# Patient Record
Sex: Male | Born: 1965 | Race: White | Hispanic: No | Marital: Single | Smoking: Current every day smoker
Health system: Southern US, Community
[De-identification: ages and names within clinical notes are randomized; demographics above are authoritative.]

## PROBLEM LIST (undated history)

## (undated) DIAGNOSIS — K029 Dental caries, unspecified: Secondary | ICD-10-CM

## (undated) DIAGNOSIS — F32A Depression, unspecified: Secondary | ICD-10-CM

## (undated) DIAGNOSIS — N186 End stage renal disease: Secondary | ICD-10-CM

## (undated) DIAGNOSIS — I10 Essential (primary) hypertension: Secondary | ICD-10-CM

## (undated) DIAGNOSIS — F1490 Cocaine use, unspecified, uncomplicated: Secondary | ICD-10-CM

## (undated) DIAGNOSIS — R51 Headache: Secondary | ICD-10-CM

## (undated) DIAGNOSIS — Z992 Dependence on renal dialysis: Secondary | ICD-10-CM

## (undated) DIAGNOSIS — J449 Chronic obstructive pulmonary disease, unspecified: Secondary | ICD-10-CM

## (undated) DIAGNOSIS — Z9289 Personal history of other medical treatment: Secondary | ICD-10-CM

## (undated) DIAGNOSIS — F329 Major depressive disorder, single episode, unspecified: Secondary | ICD-10-CM

## (undated) DIAGNOSIS — R0602 Shortness of breath: Secondary | ICD-10-CM

## (undated) DIAGNOSIS — N289 Disorder of kidney and ureter, unspecified: Secondary | ICD-10-CM

## (undated) DIAGNOSIS — M1990 Unspecified osteoarthritis, unspecified site: Secondary | ICD-10-CM

## (undated) DIAGNOSIS — I509 Heart failure, unspecified: Secondary | ICD-10-CM

## (undated) DIAGNOSIS — F419 Anxiety disorder, unspecified: Secondary | ICD-10-CM

## (undated) DIAGNOSIS — K920 Hematemesis: Secondary | ICD-10-CM

## (undated) DIAGNOSIS — R569 Unspecified convulsions: Secondary | ICD-10-CM

## (undated) DIAGNOSIS — D649 Anemia, unspecified: Secondary | ICD-10-CM

## (undated) HISTORY — PX: INGUINAL HERNIA REPAIR: SUR1180

---

## 2003-12-04 ENCOUNTER — Emergency Department (HOSPITAL_COMMUNITY): Admission: EM | Admit: 2003-12-04 | Discharge: 2003-12-04 | Payer: Self-pay | Admitting: Emergency Medicine

## 2003-12-16 ENCOUNTER — Encounter: Admission: RE | Admit: 2003-12-16 | Discharge: 2003-12-16 | Payer: Self-pay | Admitting: Internal Medicine

## 2011-11-04 ENCOUNTER — Emergency Department (HOSPITAL_COMMUNITY): Payer: Medicaid Other

## 2011-11-04 ENCOUNTER — Inpatient Hospital Stay (HOSPITAL_COMMUNITY)
Admission: EM | Admit: 2011-11-04 | Discharge: 2011-11-29 | DRG: 674 | Disposition: A | Discharge status: Home / Self Care | Payer: Medicaid Other | Attending: Nephrology | Admitting: Nephrology

## 2011-11-04 ENCOUNTER — Inpatient Hospital Stay (HOSPITAL_COMMUNITY): Payer: Medicaid Other

## 2011-11-04 ENCOUNTER — Encounter (HOSPITAL_COMMUNITY): Payer: Self-pay | Admitting: *Deleted

## 2011-11-04 ENCOUNTER — Encounter (HOSPITAL_COMMUNITY): Payer: Self-pay

## 2011-11-04 ENCOUNTER — Emergency Department (INDEPENDENT_AMBULATORY_CARE_PROVIDER_SITE_OTHER)
Admission: EM | Admit: 2011-11-04 | Discharge: 2011-11-04 | Disposition: A | Discharge status: Other Acute Inpatient Hospital | Payer: Self-pay | Source: Home / Self Care | Attending: Family Medicine | Admitting: Family Medicine

## 2011-11-04 DIAGNOSIS — D689 Coagulation defect, unspecified: Secondary | ICD-10-CM

## 2011-11-04 DIAGNOSIS — D638 Anemia in other chronic diseases classified elsewhere: Secondary | ICD-10-CM | POA: Diagnosis present

## 2011-11-04 DIAGNOSIS — T451X5A Adverse effect of antineoplastic and immunosuppressive drugs, initial encounter: Secondary | ICD-10-CM | POA: Diagnosis not present

## 2011-11-04 DIAGNOSIS — N250 Renal osteodystrophy: Secondary | ICD-10-CM | POA: Diagnosis present

## 2011-11-04 DIAGNOSIS — D649 Anemia, unspecified: Secondary | ICD-10-CM

## 2011-11-04 DIAGNOSIS — Z7982 Long term (current) use of aspirin: Secondary | ICD-10-CM

## 2011-11-04 DIAGNOSIS — R5381 Other malaise: Secondary | ICD-10-CM | POA: Diagnosis present

## 2011-11-04 DIAGNOSIS — N179 Acute kidney failure, unspecified: Secondary | ICD-10-CM

## 2011-11-04 DIAGNOSIS — R358 Other polyuria: Secondary | ICD-10-CM | POA: Diagnosis present

## 2011-11-04 DIAGNOSIS — J4489 Other specified chronic obstructive pulmonary disease: Secondary | ICD-10-CM | POA: Diagnosis present

## 2011-11-04 DIAGNOSIS — R630 Anorexia: Secondary | ICD-10-CM | POA: Diagnosis present

## 2011-11-04 DIAGNOSIS — R5383 Other fatigue: Secondary | ICD-10-CM | POA: Diagnosis present

## 2011-11-04 DIAGNOSIS — E875 Hyperkalemia: Secondary | ICD-10-CM

## 2011-11-04 DIAGNOSIS — R042 Hemoptysis: Secondary | ICD-10-CM

## 2011-11-04 DIAGNOSIS — R3589 Other polyuria: Secondary | ICD-10-CM | POA: Diagnosis present

## 2011-11-04 DIAGNOSIS — F1910 Other psychoactive substance abuse, uncomplicated: Secondary | ICD-10-CM

## 2011-11-04 DIAGNOSIS — N058 Unspecified nephritic syndrome with other morphologic changes: Principal | ICD-10-CM | POA: Diagnosis present

## 2011-11-04 DIAGNOSIS — F1410 Cocaine abuse, uncomplicated: Secondary | ICD-10-CM | POA: Diagnosis present

## 2011-11-04 DIAGNOSIS — N189 Chronic kidney disease, unspecified: Secondary | ICD-10-CM

## 2011-11-04 DIAGNOSIS — K5900 Constipation, unspecified: Secondary | ICD-10-CM | POA: Diagnosis present

## 2011-11-04 DIAGNOSIS — N19 Unspecified kidney failure: Secondary | ICD-10-CM

## 2011-11-04 DIAGNOSIS — N2581 Secondary hyperparathyroidism of renal origin: Secondary | ICD-10-CM | POA: Diagnosis present

## 2011-11-04 DIAGNOSIS — Z23 Encounter for immunization: Secondary | ICD-10-CM

## 2011-11-04 DIAGNOSIS — K921 Melena: Secondary | ICD-10-CM

## 2011-11-04 DIAGNOSIS — G4700 Insomnia, unspecified: Secondary | ICD-10-CM | POA: Diagnosis present

## 2011-11-04 DIAGNOSIS — E872 Acidosis, unspecified: Secondary | ICD-10-CM | POA: Diagnosis not present

## 2011-11-04 DIAGNOSIS — J449 Chronic obstructive pulmonary disease, unspecified: Secondary | ICD-10-CM | POA: Diagnosis present

## 2011-11-04 DIAGNOSIS — R1085 Abdominal pain of multiple sites: Secondary | ICD-10-CM

## 2011-11-04 DIAGNOSIS — F1490 Cocaine use, unspecified, uncomplicated: Secondary | ICD-10-CM | POA: Diagnosis present

## 2011-11-04 DIAGNOSIS — R634 Abnormal weight loss: Secondary | ICD-10-CM

## 2011-11-04 DIAGNOSIS — E860 Dehydration: Secondary | ICD-10-CM | POA: Diagnosis present

## 2011-11-04 DIAGNOSIS — K029 Dental caries, unspecified: Secondary | ICD-10-CM

## 2011-11-04 DIAGNOSIS — R809 Proteinuria, unspecified: Secondary | ICD-10-CM | POA: Diagnosis present

## 2011-11-04 DIAGNOSIS — I776 Arteritis, unspecified: Secondary | ICD-10-CM | POA: Diagnosis present

## 2011-11-04 DIAGNOSIS — T39391A Poisoning by other nonsteroidal anti-inflammatory drugs [NSAID], accidental (unintentional), initial encounter: Secondary | ICD-10-CM | POA: Diagnosis present

## 2011-11-04 DIAGNOSIS — K5904 Chronic idiopathic constipation: Secondary | ICD-10-CM | POA: Diagnosis present

## 2011-11-04 DIAGNOSIS — Z720 Tobacco use: Secondary | ICD-10-CM

## 2011-11-04 DIAGNOSIS — R6883 Chills (without fever): Secondary | ICD-10-CM | POA: Diagnosis present

## 2011-11-04 DIAGNOSIS — F17200 Nicotine dependence, unspecified, uncomplicated: Secondary | ICD-10-CM | POA: Diagnosis present

## 2011-11-04 DIAGNOSIS — R319 Hematuria, unspecified: Secondary | ICD-10-CM

## 2011-11-04 DIAGNOSIS — R030 Elevated blood-pressure reading, without diagnosis of hypertension: Secondary | ICD-10-CM | POA: Diagnosis not present

## 2011-11-04 DIAGNOSIS — H5359 Other color vision deficiencies: Secondary | ICD-10-CM | POA: Diagnosis present

## 2011-11-04 DIAGNOSIS — R112 Nausea with vomiting, unspecified: Secondary | ICD-10-CM | POA: Diagnosis present

## 2011-11-04 DIAGNOSIS — F1210 Cannabis abuse, uncomplicated: Secondary | ICD-10-CM | POA: Diagnosis present

## 2011-11-04 DIAGNOSIS — R109 Unspecified abdominal pain: Secondary | ICD-10-CM | POA: Diagnosis present

## 2011-11-04 DIAGNOSIS — H5350 Unspecified color vision deficiencies: Secondary | ICD-10-CM

## 2011-11-04 DIAGNOSIS — D6959 Other secondary thrombocytopenia: Secondary | ICD-10-CM | POA: Diagnosis not present

## 2011-11-04 HISTORY — DX: Dental caries, unspecified: K02.9

## 2011-11-04 HISTORY — DX: Cocaine use, unspecified, uncomplicated: F14.90

## 2011-11-04 HISTORY — DX: Chronic obstructive pulmonary disease, unspecified: J44.9

## 2011-11-04 LAB — ABO/RH: ABO/RH(D): O POS

## 2011-11-04 LAB — POCT I-STAT TROPONIN I: Troponin i, poc: 0 ng/mL (ref 0.00–0.08)

## 2011-11-04 LAB — URINE MICROSCOPIC-ADD ON

## 2011-11-04 LAB — CBC
MCH: 29.1 pg (ref 26.0–34.0)
MCV: 84.8 fL (ref 78.0–100.0)
RBC: 2.89 MIL/uL — ABNORMAL LOW (ref 4.22–5.81)
RDW: 12.9 % (ref 11.5–15.5)
WBC: 5 10*3/uL (ref 4.0–10.5)

## 2011-11-04 LAB — PRO B NATRIURETIC PEPTIDE: Pro B Natriuretic peptide (BNP): 592.1 pg/mL — ABNORMAL HIGH (ref 0–125)

## 2011-11-04 LAB — URINALYSIS, ROUTINE W REFLEX MICROSCOPIC
Bilirubin Urine: NEGATIVE
Nitrite: NEGATIVE
Protein, ur: 100 mg/dL — AB
Specific Gravity, Urine: 1.014 (ref 1.005–1.030)

## 2011-11-04 LAB — COMPREHENSIVE METABOLIC PANEL
AST: 5 U/L (ref 0–37)
Albumin: 3.2 g/dL — ABNORMAL LOW (ref 3.5–5.2)
Alkaline Phosphatase: 59 U/L (ref 39–117)
CO2: 16 mEq/L — ABNORMAL LOW (ref 19–32)
Calcium: 9 mg/dL (ref 8.4–10.5)
Glucose, Bld: 80 mg/dL (ref 70–99)
Potassium: 6 mEq/L — ABNORMAL HIGH (ref 3.5–5.1)

## 2011-11-04 LAB — RENAL FUNCTION PANEL
CO2: 17 mEq/L — ABNORMAL LOW (ref 19–32)
Calcium: 8.8 mg/dL (ref 8.4–10.5)
GFR calc Af Amer: 6 mL/min — ABNORMAL LOW (ref >90)
GFR calc non Af Amer: 5 mL/min — ABNORMAL LOW (ref >90)
Glucose, Bld: 93 mg/dL (ref 70–99)
Phosphorus: 6.1 mg/dL — ABNORMAL HIGH (ref 2.3–4.6)
Potassium: 5.8 mEq/L — ABNORMAL HIGH (ref 3.5–5.1)
Sodium: 137 mEq/L (ref 135–145)

## 2011-11-04 LAB — IRON AND TIBC
TIBC: 218 ug/dL (ref 215–435)
UIBC: 166 ug/dL (ref 125–400)

## 2011-11-04 LAB — HEPATIC FUNCTION PANEL
Alkaline Phosphatase: 65 U/L (ref 39–117)
Total Protein: 7.6 g/dL (ref 6.0–8.3)

## 2011-11-04 LAB — DIFFERENTIAL
Basophils Absolute: 0 10*3/uL (ref 0.0–0.1)
Eosinophils Absolute: 0.1 10*3/uL (ref 0.0–0.7)
Monocytes Absolute: 0.2 10*3/uL (ref 0.1–1.0)
Neutrophils Relative %: 71 % (ref 43–77)

## 2011-11-04 LAB — MRSA PCR SCREENING: MRSA by PCR: NEGATIVE

## 2011-11-04 LAB — FERRITIN: Ferritin: 286 ng/mL (ref 22–322)

## 2011-11-04 LAB — RETICULOCYTES
RBC.: 3.12 MIL/uL — ABNORMAL LOW (ref 4.22–5.81)
Retic Ct Pct: 0.5 % (ref 0.4–3.1)

## 2011-11-04 LAB — CARDIAC PANEL(CRET KIN+CKTOT+MB+TROPI)
Total CK: 66 U/L (ref 7–232)
Troponin I: <0.3 ng/mL (ref <0.30)

## 2011-11-04 LAB — PHOSPHORUS: Phosphorus: 6.6 mg/dL — ABNORMAL HIGH (ref 2.3–4.6)

## 2011-11-04 LAB — LIPASE, BLOOD: Lipase: 33 U/L (ref 11–59)

## 2011-11-04 LAB — MAGNESIUM: Magnesium: 2.2 mg/dL (ref 1.5–2.5)

## 2011-11-04 LAB — APTT: aPTT: 60 seconds — ABNORMAL HIGH (ref 24–37)

## 2011-11-04 LAB — PROTIME-INR
INR: 1.14 (ref 0.00–1.49)
Prothrombin Time: 14.8 seconds (ref 11.6–15.2)

## 2011-11-04 LAB — VITAMIN B12: Vitamin B-12: 412 pg/mL (ref 211–911)

## 2011-11-04 MED ORDER — SODIUM BICARBONATE 8.4 % IV SOLN
INTRAVENOUS | Status: DC
  Administered 2011-11-05 – 2011-11-08 (×10): via INTRAVENOUS
  Filled 2011-11-04 (×24): qty 150

## 2011-11-04 MED ORDER — SODIUM CHLORIDE 0.9 % IV SOLN
Freq: Once | INTRAVENOUS | Status: AC
  Administered 2011-11-04: 16:00:00 via INTRAVENOUS

## 2011-11-04 MED ORDER — ONDANSETRON HCL 4 MG/2ML IJ SOLN: 4.0000 mg | Freq: Four times a day (QID) | INTRAMUSCULAR | Status: DC | PRN

## 2011-11-04 MED ORDER — SODIUM CHLORIDE 0.9 % IV SOLN
INTRAVENOUS | Status: DC
  Administered 2011-11-04 – 2011-11-07 (×5): via INTRAVENOUS

## 2011-11-04 MED ORDER — PANTOPRAZOLE SODIUM 40 MG PO TBEC
40.0000 mg | DELAYED_RELEASE_TABLET | Freq: Two times a day (BID) | ORAL | Status: DC
  Administered 2011-11-05 – 2011-11-11 (×13): 40 mg via ORAL
  Filled 2011-11-04 (×14): qty 1

## 2011-11-04 MED ORDER — SODIUM POLYSTYRENE SULFONATE 15 GM/60ML PO SUSP
30.0000 g | Freq: Once | ORAL | Status: AC
  Administered 2011-11-04: 30 g via ORAL
  Filled 2011-11-04: qty 120

## 2011-11-04 MED ORDER — METHYLPREDNISOLONE SODIUM SUCC 1000 MG IJ SOLR
500.0000 mg | INTRAMUSCULAR | Status: DC
  Administered 2011-11-05 – 2011-11-06 (×3): 500 mg via INTRAVENOUS
  Filled 2011-11-04 (×4): qty 4

## 2011-11-04 MED ORDER — ONDANSETRON HCL 4 MG PO TABS: 4.0000 mg | ORAL_TABLET | Freq: Four times a day (QID) | ORAL | Status: DC | PRN

## 2011-11-04 MED ORDER — SODIUM CHLORIDE 0.9 % IJ SOLN
3.0000 mL | Freq: Two times a day (BID) | INTRAMUSCULAR | Status: DC
  Administered 2011-11-04 – 2011-11-09 (×4): 3 mL via INTRAVENOUS

## 2011-11-04 MED ORDER — PANTOPRAZOLE SODIUM 40 MG IV SOLR: 40.0000 mg | INTRAVENOUS | Status: DC

## 2011-11-04 MED ORDER — LEVOFLOXACIN IN D5W 750 MG/150ML IV SOLN
750.0000 mg | Freq: Once | INTRAVENOUS | Status: AC
  Administered 2011-11-05: 750 mg via INTRAVENOUS
  Filled 2011-11-04: qty 150

## 2011-11-04 MED ORDER — ALBUTEROL SULFATE (5 MG/ML) 0.5% IN NEBU: 2.5000 mg | INHALATION_SOLUTION | Freq: Four times a day (QID) | RESPIRATORY_TRACT | Status: DC | PRN

## 2011-11-04 MED ORDER — SODIUM POLYSTYRENE SULFONATE 15 GM/60ML PO SUSP: 45.0000 g | Freq: Once | ORAL | Status: DC

## 2011-11-04 MED ORDER — LEVOFLOXACIN IN D5W 500 MG/100ML IV SOLN: 500.0000 mg | INTRAVENOUS | Status: DC

## 2011-11-04 NOTE — ED Notes (Signed)
C/o lost 7-10 lb past few days, coughing up blood past 2 days, LUQ pain, epigastric pain, nausea, bloated,

## 2011-11-04 NOTE — ED Provider Notes (Signed)
Medical screening examination/treatment/procedure(s) were conducted as a shared visit with non-physician practitioner(s) and myself.  I personally evaluated the patient during the encounter  Pt with multiple symptoms, findings of anemia, associated renal failure with mild hyperkalemia of 6, no ECG changes, given kayaxelate, IVF's.  Pt agreeable to admission.  CXR shows no obv pneumonia or mass.  No fever.  Hemodynamically stable, airway intact, no respiratory distress noted on exam.  sats normal on RA.    Saddie Benders. Quina Wilbourne, MD 11/04/11 2101

## 2011-11-04 NOTE — Consult Note (Signed)
[facility] Renal Consultation Note  Requesting MD: Hospitalist service Indication for Consultation: Presenting with a creatinine of 10 and no historical data  HPI: Jeremy Mccoy is a 46 y.o.white male with past medical history significant for lifelong tobacco use and likely COPD, history of crack cocaine use but otherwise no history of going to the doctor.  He presents today to the emergency department with a week long history of weakness, dizziness, nausea vomiting and abdominal pain. He's had anorexia and an unintentional weight loss. She reports possibly coughing up blood 3 days ago.  He reportedly took about 1000 mg of ibuprofen 2 days ago but has not otherwise been using/abusing those medications. He has no history of hypertension or being treated with an ACE or an ARB. His labs  In the emergency department show a BUN of 107, creatinine of 10.6 and potassium of 6. His urinalysis shows too numerous to count RBCs with also hyaline casts. Chest is hemoglobin of 8.4. 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 He's going to the bathroom more frequently but volume overall may be down. 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 However, has noticed the last couple of weeks has been more difficult to get things done. He finally comes to the hospital at the prompting of his family today.  Apparently his sister has some type of syndrome (Birt-Hogg-Dube) which causes kidney tumors, but they don't indicate a family history of renal failure.   Creatinine, Ser  Date/Time Value Range Status  11/04/2011  2:05 PM 10.64* 0.50 - 1.35 mg/dL Final     PMHx:   Past Medical History  Diagnosis Date  . COPD (chronic obstructive pulmonary disease)   . Smoker   . Crack cocaine use   . Dental caries     No past surgical history on file.  Family Hx: No family history on file.  Social History:  reports  that he has been smoking.  He does not have any smokeless tobacco history on file. He reports that he uses illicit drugs (Cocaine and Marijuana). He reports that he does not drink alcohol.  Allergies: No Known Allergies  Medications: Prior to Admission medications   Medication Sig Start Date End Date Taking? Authorizing Provider  aspirin 81 MG tablet Take 81 mg by mouth daily.   Yes Historical Provider, MD  ibuprofen (ADVIL,MOTRIN) 200 MG tablet Take 200 mg by mouth every 6 (six) hours as needed. For pain   Yes Historical Provider, MD    I have reviewed the patient's current medications.  Labs:  Results for orders placed during the hospital encounter of 11/04/11 (from the past 48 hour(s))  CBC     Status: Abnormal   Collection Time   11/04/11  2:05 PM      Component Value Range Comment   WBC 5.0  4.0 - 10.5 K/uL    RBC 2.89 (*) 4.22 - 5.81 MIL/uL    Hemoglobin 8.4 (*) 13.0 - 17.0 g/dL    HCT 24.5 (*) 39.0 - 52.0 %    MCV 84.8  78.0 - 100.0 fL    MCH 29.1  26.0 - 34.0 pg    MCHC 34.3  30.0 - 36.0 g/dL    RDW 12.9  11.5 - 15.5 %    Platelets 171  150 - 400 K/uL   DIFFERENTIAL     Status: Normal  Collection Time   11/04/11  2:05 PM      Component Value Range Comment   Neutrophils Relative 71  43 - 77 %    Neutro Abs 3.5  1.7 - 7.7 K/uL    Lymphocytes Relative 22  12 - 46 %    Lymphs Abs 1.1  0.7 - 4.0 K/uL    Monocytes Relative 4  3 - 12 %    Monocytes Absolute 0.2  0.1 - 1.0 K/uL    Eosinophils Relative 3  0 - 5 %    Eosinophils Absolute 0.1  0.0 - 0.7 K/uL    Basophils Relative 0  0 - 1 %    Basophils Absolute 0.0  0.0 - 0.1 K/uL   COMPREHENSIVE METABOLIC PANEL     Status: Abnormal   Collection Time   11/04/11  2:05 PM      Component Value Range Comment   Sodium 135  135 - 145 mEq/L    Potassium 6.0 (*) 3.5 - 5.1 mEq/L    Chloride 104  96 - 112 mEq/L    CO2 16 (*) 19 - 32 mEq/L    Glucose, Bld 80  70 - 99 mg/dL    BUN 107 (*) 6 - 23 mg/dL    Creatinine, Ser 10.64 (*)  0.50 - 1.35 mg/dL    Calcium 9.0  8.4 - 10.5 mg/dL    Total Protein 7.1  6.0 - 8.3 g/dL    Albumin 3.2 (*) 3.5 - 5.2 g/dL    AST 5  0 - 37 U/L    ALT <5  0 - 53 U/L REPEATED TO VERIFY   Alkaline Phosphatase 59  39 - 117 U/L    Total Bilirubin 0.1 (*) 0.3 - 1.2 mg/dL    GFR calc non Af Amer 5 (*) >90 mL/min    GFR calc Af Amer 6 (*) >90 mL/min   LIPASE, BLOOD     Status: Normal   Collection Time   11/04/11  2:05 PM      Component Value Range Comment   Lipase 33  11 - 59 U/L   LACTATE DEHYDROGENASE     Status: Normal   Collection Time   11/04/11  2:05 PM      Component Value Range Comment   LDH 177  94 - 250 U/L   URINALYSIS, ROUTINE W REFLEX MICROSCOPIC     Status: Abnormal   Collection Time   11/04/11  2:10 PM      Component Value Range Comment   Color, Urine YELLOW  YELLOW    APPearance CLOUDY (*) CLEAR    Specific Gravity, Urine 1.014  1.005 - 1.030    pH 5.0  5.0 - 8.0    Glucose, UA NEGATIVE  NEGATIVE mg/dL    Hgb urine dipstick LARGE (*) NEGATIVE    Bilirubin Urine NEGATIVE  NEGATIVE    Ketones, ur NEGATIVE  NEGATIVE mg/dL    Protein, ur 100 (*) NEGATIVE mg/dL    Urobilinogen, UA 0.2  0.0 - 1.0 mg/dL    Nitrite NEGATIVE  NEGATIVE    Leukocytes, UA TRACE (*) NEGATIVE   URINE MICROSCOPIC-ADD ON     Status: Abnormal   Collection Time   11/04/11  2:10 PM      Component Value Range Comment   Squamous Epithelial / LPF RARE  RARE    WBC, UA 0-2  <3 WBC/hpf    RBC / HPF TOO NUMEROUS TO COUNT  <3 RBC/hpf  Casts HYALINE CASTS (*) NEGATIVE GRANULAR CAST   Urine-Other AMORPHOUS URATES/PHOSPHATES     POCT I-STAT TROPONIN I     Status: Normal   Collection Time   11/04/11  2:29 PM      Component Value Range Comment   Troponin i, poc 0.00  0.00 - 0.08 ng/mL    Comment 3            TYPE AND SCREEN     Status: Normal   Collection Time   11/04/11  2:53 PM      Component Value Range Comment   ABO/RH(D) O POS      Antibody Screen NEG      Sample Expiration 11/07/2011     ABO/RH      Status: Normal   Collection Time   11/04/11  2:53 PM      Component Value Range Comment   ABO/RH(D) O POS     PROTIME-INR     Status: Normal   Collection Time   11/04/11  3:03 PM      Component Value Range Comment   Prothrombin Time 14.8  11.6 - 15.2 seconds    INR 1.14  0.00 - 1.49   APTT     Status: Abnormal   Collection Time   11/04/11  3:03 PM      Component Value Range Comment   aPTT 60 (*) 24 - 37 seconds   RETICULOCYTES     Status: Abnormal   Collection Time   11/04/11  3:03 PM      Component Value Range Comment   Retic Ct Pct 0.5  0.4 - 3.1 %    RBC. 3.12 (*) 4.22 - 5.81 MIL/uL    Retic Count, Manual 15.6 (*) 19.0 - 186.0 K/uL      ROS:  A comprehensive review of systems was negative except for: Constitutional: positive for fatigue and weight loss. He reports 1 episode of hemoptysis 3 days ago but has not been ongoing. He reports some mild abdominal pain and possible tarry stools. He has decreased by mouth intake but no nausea or vomiting. He denies lower extremity edema. He admits to polyuria but overall states that he thinks his volume of urine output is down.  He has upper extremity paresthesias which she's had before. 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 He denies any rashes or joint complaints. The remainder of the review systems is negative.  Physical Exam: Filed Vitals:   11/04/11 1517  BP: 124/77  Pulse: 57  Temp:   Resp: 21     General: An alert white male who appears older than his stated age. He is thin. He is in no acute distress. HEENT: Pupils are equal round reactive to light, extra motions are intact, there is no scleral icterus. Mucous membranes are slightly dry. Neck: There is no jugular venous distention, carotid bruits or lymphadenopathy. Heart: Bradycardic without murmur, gallop, or rub. Lungs: Poor air movement bilaterally. There are no crackles. It does not appear to be effusions. Abdomen: Positive bowel sounds,  soft, nontender, nondistended. There is no hepatosplenomegaly and no abdominal bruits are appreciated. Extremities: No clubbing, cyanosis, or edema. Skin: No rash. No synovitis. Neuro: Patient is alert and oriented x3. The remainder of the neurologic exam is nonfocal.  Assessment/Plan: 46 year old white male with no past medical history and no historical laboratory data who presents with a creatinine of 10, BUN of 107 and potassium of 6. He also had hematuria  and possible history of hemoptysis. He may be GI bleeding as well. 1.Renal- elevated creatinine of unknown etiology.  His urinalysis basically shows hematuria.   His renal ultrasound shows normal-sized kidneys with some increased cortical echogenicity with no hydro-.  Therefore, hopefully this indicates an acute process that might ? be reversible. Given that he has hematuria and hemoptysis this raises the issue of a possible pulmonary renal syndrome. The appropriate serologies will be collected (ANA, ANCA., anti-GBM ).  Since it is now Friday evening a kidney biopsy will not be able to be obtained until Monday. I will start him on high-dose steroids empirically.  Of course the other etiologies of his elevated creatinine could be more benign and may be ATN and GI bleeding from decreased oral intake, or from a cocaine-induced hypertensive kidney disease. I will hydrate him with fluids and we will monitor his kidney function, and ins and outs closely.  I really do not know his renal prognosis at this time. There are no acute indications for dialysis as long as we can treat his potassium medically. 2. Hypertension/volume  - interesting that he is not hypertensive in the setting of renal failure. This may mean that he is dry. Will hydrate with fluids as above. 3. Hyperkalemia- could be due to GI bleeding, could be swallowing blood from a pulmonary source. Have given a dose of Kayexalate and will hydrate and treat his acidosis which should help as well.   4.  Metabolic acidosis- due to renal failure. Will start a bicarbonate drip. 5. Anemia  - either do to chronic kidney disease or bleeding either from a pulmonary or GI source. If not low enough to need transfusion at this time. However with hydration it may lower. INR and platelets are normal.  Thank you for this consultation,  we'll follow closely with you   Tanya Crothers A 11/04/2011, 5:05 PM

## 2011-11-04 NOTE — ED Notes (Signed)
Feels like your ribs broke, numbness both arms, air in his chest, dizzy, cannot keep food down; cold like symptoms. Cold/hot. Productive cough - red tinged. Smoke weed and made him gag.

## 2011-11-04 NOTE — ED Notes (Signed)
Pt states that when he coughs, he "sprays blood all over." Pt states he has been sob "for a long time-like a couple years."

## 2011-11-04 NOTE — Progress Notes (Signed)
ANTIBIOTIC CONSULT NOTE - INITIAL  Pharmacy Consult for Levaquin Indication: Empiric Coverage  No Known Allergies  Patient Measurements:  Ht 6 feet, Wt 71.4 kg (IBW 77.6 kg)  Vital Signs: Temp: 97.8 F (36.6 C) (06/21 1235) Temp src: Oral (06/21 1235) BP: 144/85 mmHg (06/21 1615) Pulse Rate: 64  (06/21 1615) Intake/Output from previous day:   Intake/Output from this shift:    Labs:  Basename 11/04/11 1405  WBC 5.0  HGB 8.4*  PLT 171  LABCREA --  CREATININE 10.64*   CrCl is unknown because there is no height on file for the current visit. No results found for this basename: VANCOTROUGH:2,VANCOPEAK:2,VANCORANDOM:2,GENTTROUGH:2,GENTPEAK:2,GENTRANDOM:2,TOBRATROUGH:2,TOBRAPEAK:2,TOBRARND:2,AMIKACINPEAK:2,AMIKACINTROU:2,AMIKACIN:2, in the last 72 hours   Microbiology: No results found for this or any previous visit (from the past 720 hour(s)).  Medical History: Past Medical History  Diagnosis Date  . COPD (chronic obstructive pulmonary disease)   . Smoker   . Crack cocaine use   . Dental caries     Assessment: 46 yo male with CC of coughing up blood to start on levaquin for empiric coverage of possible bacteremia related to dental caries and possible urine infection per MD. Pt is afebrile with WBC wnl at 5.0,  SCr of 10.64 (acute renal failure on suspected CKD) with recent high dose NSAID use and dehydration. CrCl ~9 ml/min. Per Renal, no acute indications for dialysis as long as high  K+ can be treated medically. BCx x2, UCx pending.   Goal of Therapy:  Clinical resolution  Plan:  1.  Levaquin 750 mg IV x1, then 500 mg IV q48h 2.  Follow up renal function and renal plans for dose adjustments 3.  Follow up cultures, s/sx of infection    Allene Pyo 11/04/2011,5:35 PM

## 2011-11-04 NOTE — ED Notes (Signed)
Pt returned from xray

## 2011-11-04 NOTE — ED Provider Notes (Signed)
History     CSN: [id number]  Arrival date & time 11/04/11  1226   First MD Initiated Contact with Patient 11/04/11 1310      Chief Complaint  Patient presents with  . Hemoptysis    (Consider location/radiation/quality/duration/timing/severity/associated sxs/prior treatment) Patient is a 46 y.o. male presenting with weakness. The history is provided by the patient.  Weakness The primary symptoms include headaches, dizziness, nausea and vomiting. Primary symptoms do not include loss of consciousness, altered mental status, visual change or fever. The symptoms began more than 1 week ago. The symptoms are unchanged. The neurological symptoms are diffuse.  The headache is associated with weakness. The headache is not associated with visual change or neck stiffness.  Dizziness also occurs with nausea and vomiting.   Additional symptoms include weakness. Additional symptoms do not include neck stiffness.  PT states he has had weakness, cough, shortness of breath, anorexia, nausea with eating for the last 2-3 weeks. States today coughed up about 2 shots full of blood. States went to UC was sent here. Pt states he has had pain in left upper quadrant and left flank in th past, but it comes and goes. Admtis to heavy smoking. No primary care provider. Admits to about 8lb weight loss in last 2 wks, but states that's because he has not been eating. Denies recent travel, weight loss.   Past Medical History  Diagnosis Date  . COPD (chronic obstructive pulmonary disease)   . Smoker     No past surgical history on file.  No family history on file.  History  Substance Use Topics  . Smoking status: Current Everyday Smoker -- 1.0 packs/day  . Smokeless tobacco: Not on file  . Alcohol Use: No      Review of Systems  Constitutional: Negative for fever and chills.  HENT: Negative for neck pain and neck stiffness.   Respiratory: Positive for cough and shortness of breath.   Cardiovascular:  Negative.   Gastrointestinal: Positive for nausea, vomiting and abdominal pain. Negative for diarrhea and blood in stool.  Genitourinary: Negative for dysuria and flank pain.  Musculoskeletal: Negative.   Skin: Positive for pallor.  Neurological: Positive for dizziness and headaches. Negative for loss of consciousness.  Psychiatric/Behavioral: Negative for altered mental status.    Allergies  Review of patient's allergies indicates no known allergies.  Home Medications   Current Outpatient Rx  Name Route Sig Dispense Refill  . ASPIRIN 81 MG PO TABS Oral Take 81 mg by mouth daily.    . IBUPROFEN 200 MG PO TABS Oral Take 200 mg by mouth every 6 (six) hours as needed. For pain      BP 124/77  Pulse 57  Temp 97.8 F (36.6 C) (Oral)  Resp 21  SpO2 100%  Physical Exam  Nursing note and vitals reviewed. Constitutional: He is oriented to person, place, and time. He appears well-developed and well-nourished. No distress.  HENT:  Head: Normocephalic.  Eyes: Conjunctivae are normal.  Neck: Normal range of motion. Neck supple.  Cardiovascular: Normal rate, regular rhythm and normal heart sounds.   Pulmonary/Chest: Effort normal. No respiratory distress. He has wheezes. He has no rales.       Expiratory wheezes in all lung fields  Abdominal: Soft. Bowel sounds are normal. He exhibits no distension. There is no tenderness. There is no rebound.  Musculoskeletal: Normal range of motion. He exhibits no edema.  Neurological: He is alert and oriented to person, place, and time. No cranial nerve  deficit. Coordination normal.  Skin: Skin is warm and dry. There is pallor.  Psychiatric: He has a normal mood and affect.    ED Course  Procedures (including critical care time)   Date: 11/04/2011  Rate: 65  Rhythm: normal sinus rhythm  QRS Axis: normal  Intervals: normal  ST/T Wave abnormalities: normal  Conduction Disutrbances:none  Narrative Interpretation:   Old EKG Reviewed: none  available   Pt  Is pale appearing, no obvious active bleeding currently. Vital signs normal. Will get labs, CXR.   Labs Reviewed  CBC - Abnormal; Notable for the following:    RBC 2.89 (*)     Hemoglobin 8.4 (*)     HCT 24.5 (*)     All other components within normal limits  COMPREHENSIVE METABOLIC PANEL - Abnormal; Notable for the following:    Potassium 6.0 (*)     CO2 16 (*)     BUN 107 (*)     Creatinine, Ser 10.64 (*)     Albumin 3.2 (*)     Total Bilirubin 0.1 (*)     GFR calc non Af Amer 5 (*)     GFR calc Af Amer 6 (*)     All other components within normal limits  URINALYSIS, ROUTINE W REFLEX MICROSCOPIC - Abnormal; Notable for the following:    APPearance CLOUDY (*)     Hgb urine dipstick LARGE (*)     Protein, ur 100 (*)     Leukocytes, UA TRACE (*)     All other components within normal limits  URINE MICROSCOPIC-ADD ON - Abnormal; Notable for the following:    Casts HYALINE CASTS (*)  GRANULAR CAST   All other components within normal limits  APTT - Abnormal; Notable for the following:    aPTT 60 (*)     All other components within normal limits  RETICULOCYTES - Abnormal; Notable for the following:    RBC. 3.12 (*)     Retic Count, Manual 15.6 (*)     All other components within normal limits  DIFFERENTIAL  LIPASE, BLOOD  POCT I-STAT TROPONIN I  PROTIME-INR  TYPE AND SCREEN  LACTATE DEHYDROGENASE  ABO/RH  VITAMIN B12  FOLATE  IRON AND TIBC  FERRITIN   Dg Chest 2 View  11/04/2011  *RADIOLOGY REPORT*  Clinical Data: Chest pain cough.  CHEST - 2 VIEW  Comparison: 12/04/2003  Findings: Hilar fullness is similar to prior.  Cardiac contour within normal limits.  There is a mild interstitial prominence and peribronchial thickening.  No focal consolidation, pleural effusion, or pneumothorax.  No acute osseous finding.  There is mild wedge deformity of a lower thoracic vertebral body, similar to prior.  IMPRESSION: Mild interstitial prominence / peribronchial  thickening is nonspecific.  May reflect chronic bronchitic change (particularly if there is a smoking history) or seen with atypical infection.  No focal consolidation.  Original Report Authenticated By: Suanne Marker, M.D.   4:08 PM HGB 8.4. Creat 10.64, BUN 107. Ordered kayexalate. Glu in urine. Denies bloody stool. Ordrered US renal, anemia panel. Will admit.  Filed Vitals:   11/04/11 1517  BP: 124/77  Pulse: 57  Temp:   Resp: 21   4:13 PM Spoke with triad, will admit.   1. Renal failure   2. Anemia   3. Hemoptysis   4. Hematuria   5. Hyperkalemia       MDM          Renold Genta, PA 11/04/11  1613 

## 2011-11-04 NOTE — ED Notes (Signed)
Pt transported to XRAY °

## 2011-11-04 NOTE — H&P (Addendum)
History and Physical Examination  Date: 11/04/2011  Patient name: Jeremy Mccoy Medical record number: [id number] Date of birth: 1966/04/26 Age: 46 y.o. Gender: male PCP: Pcp Not In System  Chief Complaint:  Chief Complaint  Patient presents with  . Hemoptysis     History of Present Illness: Jeremy Mccoy is an 46 y.o. male long time crack cocaine user, former alcoholic, smoker who has not seen a physician or care provider in the past 6 years and does not have a regular physician reports that he began to feel left rib pain approximately 2 weeks ago.  His symptoms progressed to pain in the epigastric area of his abdomen that felt like air trapped in the abdomen and gas.  He says the pain then alternated between the left side of the abdomen and the epigastric area for approximately one week.  The pain became more severe and the patient developed nausea and dizziness.  He reports that the pain and abdominal discomfort became so severe that he had trouble sleeping.  He reports increased urination with only a small amount of urine.  He reports the development of tingling and numbness in the hands.  Approximately 2 days ago he reports that he began to his cough.  He reports that he noticed blood in the sputum.  He also reports that he has lost approximately 7 pounds over the past week.  He reports that 2 days prior to admission he took five 200 mg ibuprofen tablets with no relief in symptoms.  The patient reports that he noticed that his urine has been "dirty".  The patient says that he's been experiencing shaking cold chills over the past several days.  The patient denies subjective fever.  The patient reports alternating constipation and loose stools.  The patient reports no vomiting but has been severely nauseated.  He decided to seek medical care because of the coughing up of blood.  He went to the local urgent care center and was evaluated and then sent to the emergency department for further  evaluation and management.  He emergency department he was noted to have a creatinine greater than 10.  He was hyperkalemic with a potassium of 6.0.  He had some evidence of peak T waves on EKG.  The patient was clinically dehydrated and pale.  The patient reports no history of renal disease or hypertension in his past medical history.  He does report that he chronically uses cocaine and has done so for the past 20 years.  He smokes one pack of cigarettes per day and has smoked for over 30 years.  He quit alcohol several years ago.  He lives alone.  He reports that he has a heavy work burden and works around toxic chemicals at work including methylene chloride and acetone.  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  Hospitalization was requested for further ongoing treatment.  Past Medical History Past Medical History  Diagnosis Date  . COPD (chronic obstructive pulmonary disease)   . Smoker   . Crack cocaine use   . Dental caries     Past Surgical History Pt denies  Home Meds: Prior to Admission medications   Medication Sig Start Date End Date Taking? Authorizing Provider  aspirin 81 MG tablet Take 81 mg by mouth daily.   Yes Historical Provider, MD  ibuprofen (ADVIL,MOTRIN) 200 MG tablet Take 200 mg by mouth every 6 (six) hours as needed. For pain   Yes Historical Provider, MD   Allergies: Review  of patient's allergies indicates no known allergies.  Social History:  History   Social History  . Marital Status: Single    Spouse Name: N/A    Number of Children: N/A  . Years of Education: N/A   Occupational History  . Not on file.   Social History Main Topics  . Smoking status: Current Everyday Smoker -- 1.0 packs/day  . Smokeless tobacco: Not on file  . Alcohol Use: No  . Drug Use: Yes    Special: Cocaine, Marijuana  . Sexually Active:    Other Topics Concern  . Not on file   Social History Narrative  . No narrative on file   Family History:  Noncontributory  Review of Systems: Constitutional: positive for anorexia, chills, fatigue, malaise, night sweats and weight loss Eyes: positive for color blindness and irritation Ears, nose, mouth, throat, and face: positive for hearing loss, nasal congestion, snoring, sore mouth, sore throat and voice change Respiratory: positive for chronic bronchitis, cough and hemoptysis Cardiovascular: negative for chest pain, chest pressure/discomfort, claudication and exertional chest pressure/discomfort Gastrointestinal: positive for abdominal pain, change in bowel habits, constipation, diarrhea, dyspepsia, melena and vomiting Genitourinary:positive for dysuria, frequency and hematuria Integument/breast: negative Hematologic/lymphatic: positive for bleeding Musculoskeletal:positive for arthralgias, muscle weakness and myalgias Neurological: negative Behavioral/Psych: positive for anxiety, fatigue, illegal drug usage, irritability and sleep disturbance Endocrine: positive for temperature intolerance Allergic/Immunologic: positive for hay fever All other systems reviewed and reported as negative.   Physical Exam: Blood pressure 124/77, pulse 57, temperature 97.8 F (36.6 C), temperature source Oral, resp. rate 21, SpO2 100.00%. BP 124/77  Pulse 57  Temp 97.8 F (36.6 C) (Oral)  Resp 21  SpO2 100%  General Appearance:    Ill appearing, pale skin color, alert, cooperative, no distress, appears stated age  Head:    Normocephalic, without obvious abnormality, atraumatic  Eyes:    PERRL, conjunctiva/corneas clear, EOM's intact, fundi    benign, both eyes          Nose:   Nares normal, septum midline, mucosa red, irritated, no drainage  or sinus tenderness  Throat:   Lips, mucosa dry, tongue normal; dental caries and gingivitis  Neck:   Supple, symmetrical, trachea midline, no adenopathy;       thyroid:  No enlargement/tenderness/nodules; no carotid   bruit or JVD  Back:     Symmetric, no  curvature, ROM normal, no CVA tenderness  Lungs:     Clear to auscultation bilaterally, respirations unlabored  Chest wall:    No tenderness or deformity  Heart:    Regular rate and rhythm, S1 and S2 normal, no murmur, rub   or gallop  Abdomen:     Soft, bowel sounds active all four quadrants, epigastric and LUQ TTP, no rebound Tender, mild guarding noted,   no masses, no organomegaly        Extremities:   Extremities normal, atraumatic, no cyanosis or edema  Pulses:   2+ and symmetric all extremities  Skin:   Pale skin color, no rashes or lesions  Lymph nodes:   Cervical, supraclavicular, and axillary nodes normal  Neurologic:   CNII-XII intact. Normal strength, sensation and reflexes      throughout    Lab  And Imaging results:  Results for orders placed during the hospital encounter of 11/04/11 (from the past 24 hour(s))  CBC     Status: Abnormal   Collection Time   11/04/11  2:05 PM      Component Value Range  WBC 5.0  4.0 - 10.5 K/uL   RBC 2.89 (*) 4.22 - 5.81 MIL/uL   Hemoglobin 8.4 (*) 13.0 - 17.0 g/dL   HCT 24.5 (*) 39.0 - 52.0 %   MCV 84.8  78.0 - 100.0 fL   MCH 29.1  26.0 - 34.0 pg   MCHC 34.3  30.0 - 36.0 g/dL   RDW 12.9  11.5 - 15.5 %   Platelets 171  150 - 400 K/uL  DIFFERENTIAL     Status: Normal   Collection Time   11/04/11  2:05 PM      Component Value Range   Neutrophils Relative 71  43 - 77 %   Neutro Abs 3.5  1.7 - 7.7 K/uL   Lymphocytes Relative 22  12 - 46 %   Lymphs Abs 1.1  0.7 - 4.0 K/uL   Monocytes Relative 4  3 - 12 %   Monocytes Absolute 0.2  0.1 - 1.0 K/uL   Eosinophils Relative 3  0 - 5 %   Eosinophils Absolute 0.1  0.0 - 0.7 K/uL   Basophils Relative 0  0 - 1 %   Basophils Absolute 0.0  0.0 - 0.1 K/uL  COMPREHENSIVE METABOLIC PANEL     Status: Abnormal   Collection Time   11/04/11  2:05 PM      Component Value Range   Sodium 135  135 - 145 mEq/L   Potassium 6.0 (*) 3.5 - 5.1 mEq/L   Chloride 104  96 - 112 mEq/L   CO2 16 (*) 19 - 32 mEq/L    Glucose, Bld 80  70 - 99 mg/dL   BUN 107 (*) 6 - 23 mg/dL   Creatinine, Ser 10.64 (*) 0.50 - 1.35 mg/dL   Calcium 9.0  8.4 - 10.5 mg/dL   Total Protein 7.1  6.0 - 8.3 g/dL   Albumin 3.2 (*) 3.5 - 5.2 g/dL   AST 5  0 - 37 U/L   ALT <5  0 - 53 U/L   Alkaline Phosphatase 59  39 - 117 U/L   Total Bilirubin 0.1 (*) 0.3 - 1.2 mg/dL   GFR calc non Af Amer 5 (*) >90 mL/min   GFR calc Af Amer 6 (*) >90 mL/min  LIPASE, BLOOD     Status: Normal   Collection Time   11/04/11  2:05 PM      Component Value Range   Lipase 33  11 - 59 U/L  LACTATE DEHYDROGENASE     Status: Normal   Collection Time   11/04/11  2:05 PM      Component Value Range   LDH 177  94 - 250 U/L  URINALYSIS, ROUTINE W REFLEX MICROSCOPIC     Status: Abnormal   Collection Time   11/04/11  2:10 PM      Component Value Range   Color, Urine YELLOW  YELLOW   APPearance CLOUDY (*) CLEAR   Specific Gravity, Urine 1.014  1.005 - 1.030   pH 5.0  5.0 - 8.0   Glucose, UA NEGATIVE  NEGATIVE mg/dL   Hgb urine dipstick LARGE (*) NEGATIVE   Bilirubin Urine NEGATIVE  NEGATIVE   Ketones, ur NEGATIVE  NEGATIVE mg/dL   Protein, ur 100 (*) NEGATIVE mg/dL   Urobilinogen, UA 0.2  0.0 - 1.0 mg/dL   Nitrite NEGATIVE  NEGATIVE   Leukocytes, UA TRACE (*) NEGATIVE  URINE MICROSCOPIC-ADD ON     Status: Abnormal   Collection Time   11/04/11  2:10  PM      Component Value Range   Squamous Epithelial / LPF RARE  RARE   WBC, UA 0-2  <3 WBC/hpf   RBC / HPF TOO NUMEROUS TO COUNT  <3 RBC/hpf   Casts HYALINE CASTS (*) NEGATIVE   Urine-Other AMORPHOUS URATES/PHOSPHATES    POCT I-STAT TROPONIN I     Status: Normal   Collection Time   11/04/11  2:29 PM      Component Value Range   Troponin i, poc 0.00  0.00 - 0.08 ng/mL   Comment 3           TYPE AND SCREEN     Status: Normal   Collection Time   11/04/11  2:53 PM      Component Value Range   ABO/RH(D) O POS     Antibody Screen NEG     Sample Expiration 11/07/2011    ABO/RH     Status: Normal    Collection Time   11/04/11  2:53 PM      Component Value Range   ABO/RH(D) O POS    PROTIME-INR     Status: Normal   Collection Time   11/04/11  3:03 PM      Component Value Range   Prothrombin Time 14.8  11.6 - 15.2 seconds   INR 1.14  0.00 - 1.49  APTT     Status: Abnormal   Collection Time   11/04/11  3:03 PM      Component Value Range   aPTT 60 (*) 24 - 37 seconds  RETICULOCYTES     Status: Abnormal   Collection Time   11/04/11  3:03 PM      Component Value Range   Retic Ct Pct 0.5  0.4 - 3.1 %   RBC. 3.12 (*) 4.22 - 5.81 MIL/uL   Retic Count, Manual 15.6 (*) 19.0 - 186.0 K/uL     Impression   *Acute renal failure on suspected Chronic Kidney Disease  Hyperkalemia  Nausea and vomiting in adult  Uremia  Abdominal pain of multiple sites  Polysubstance abuse  Crack cocaine use  Tobacco abuse  Abnormal weight loss (10 lbs 1 week)  Anemia  Possible GI bleed  Hemoptysis  COPD (chronic obstructive pulmonary disease)  Dental caries  Black stools  Polyuria  Constipation - functional  Overdose of nonsteroidal anti-inflammatory drug (NSAID)  Malaise and fatigue  Hematuria  Proteinuria  Dehydration  Insomnia  Shaking chills  Color blindness  Plan  I immediately called and spoke with the nephrology team on call regarding patient's hyperkalemia and renal failure and they will see the patient in the ED.  He may require hemodialysis.  The patient is being given Kayexalate to help correct his potassium at this time.  The patient is going to be admitted to the step down bed for further evaluation and management.  He has been typed and screened.  Gentle IV fluids ordered.  Blood cultures ordered.  Urinalysis and urine cultures.  Renal ultrasound ordered and currently being performed.  Hemoccult stools.  Right medication for nausea.  Will provide empiric antibiotic coverage for treatment of possible bacteremia related to dental caries and possible urine infection.  We'll start  Levaquin.  Follow cultures.  We'll begin with some abdominal imaging.  Please see orders.  Repeat EKG in a.m.  Repeat electrolyte studies in a.m.  Consults Education officer, museum regarding substance abuse and smoking cessation.  Monitor for signs of cocaine withdrawal.  Consider GI consultation when patient's  renal situation is stabilized.  Nebs as needed for wheezing, cough. Please see orders and follow course.   Critical care time spent 65 mins with direct patient care activities  [place], [place] 11/04/2011, 4:46 PM

## 2011-11-04 NOTE — ED Provider Notes (Signed)
History     CSN: [id number]  Arrival date & time 11/04/11  1109   First MD Initiated Contact with Patient 11/04/11 1116      Chief Complaint  Patient presents with  . Hemoptysis    (Consider location/radiation/quality/duration/timing/severity/associated sxs/prior treatment) Patient is a 46 y.o. male presenting with abdominal pain. The history is provided by the patient.  Abdominal Pain The primary symptoms of the illness include abdominal pain. The primary symptoms of the illness do not include fever. The current episode started more than 2 days ago. The onset of the illness was gradual. The problem has not changed since onset. Associated with: smoker, weight loss, upper abd pain and coughing up blood.    History reviewed. No pertinent past medical history.  History reviewed. No pertinent past surgical history.  History reviewed. No pertinent family history.  History  Substance Use Topics  . Smoking status: Current Everyday Smoker -- 1.0 packs/day  . Smokeless tobacco: Not on file  . Alcohol Use: No      Review of Systems  Constitutional: Negative for fever and appetite change.  Gastrointestinal: Positive for abdominal pain.    Allergies  Review of patient's allergies indicates no known allergies.  Home Medications  No current outpatient prescriptions on file.  BP 124/65  Pulse 68  Temp 97.9 F (36.6 C) (Oral)  Resp 20  SpO2 99%  Physical Exam  Nursing note and vitals reviewed. Constitutional: He is oriented to person, place, and time. Vital signs are normal. He appears cachectic. He appears ill.  HENT:  Mouth/Throat: Oropharynx is clear and moist.  Eyes: Conjunctivae are normal. Pupils are equal, round, and reactive to light.  Neck: Normal range of motion. Neck supple.  Cardiovascular: Normal rate.   Pulmonary/Chest: Breath sounds normal.  Abdominal: He exhibits no mass. There is tenderness. There is no rebound and no guarding.  Lymphadenopathy:    He  has no cervical adenopathy.  Neurological: He is alert and oriented to person, place, and time.  Skin: Skin is warm and dry. There is pallor.    ED Course  Procedures (including critical care time)  Labs Reviewed - No data to display No results found.   1. Weight loss, abnormal       MDM          Billy Fischer, MD 11/04/11 1222

## 2011-11-04 NOTE — ED Notes (Signed)
Throwing up blood and sob.

## 2011-11-05 DIAGNOSIS — R042 Hemoptysis: Secondary | ICD-10-CM

## 2011-11-05 DIAGNOSIS — D689 Coagulation defect, unspecified: Secondary | ICD-10-CM | POA: Diagnosis present

## 2011-11-05 DIAGNOSIS — E875 Hyperkalemia: Secondary | ICD-10-CM

## 2011-11-05 DIAGNOSIS — F1910 Other psychoactive substance abuse, uncomplicated: Secondary | ICD-10-CM

## 2011-11-05 DIAGNOSIS — N179 Acute kidney failure, unspecified: Secondary | ICD-10-CM

## 2011-11-05 LAB — COMPREHENSIVE METABOLIC PANEL
ALT: <5 U/L (ref 0–53)
AST: 6 U/L (ref 0–37)
CO2: 15 mEq/L — ABNORMAL LOW (ref 19–32)
Calcium: 8.9 mg/dL (ref 8.4–10.5)
Sodium: 136 mEq/L (ref 135–145)
Total Protein: 6.8 g/dL (ref 6.0–8.3)

## 2011-11-05 LAB — BASIC METABOLIC PANEL
CO2: 19 mEq/L (ref 19–32)
Calcium: 8.6 mg/dL (ref 8.4–10.5)
Creatinine, Ser: 10.06 mg/dL — ABNORMAL HIGH (ref 0.50–1.35)
GFR calc non Af Amer: 5 mL/min — ABNORMAL LOW (ref >90)

## 2011-11-05 LAB — CBC
HCT: 24.4 % — ABNORMAL LOW (ref 39.0–52.0)
Hemoglobin: 8.3 g/dL — ABNORMAL LOW (ref 13.0–17.0)
MCH: 28.5 pg (ref 26.0–34.0)
MCHC: 34 g/dL (ref 30.0–36.0)
MCV: 83.8 fL (ref 78.0–100.0)

## 2011-11-05 LAB — RENAL FUNCTION PANEL
Albumin: 2.8 g/dL — ABNORMAL LOW (ref 3.5–5.2)
BUN: 106 mg/dL — ABNORMAL HIGH (ref 6–23)
CO2: 17 mEq/L — ABNORMAL LOW (ref 19–32)
Chloride: 101 mEq/L (ref 96–112)
Creatinine, Ser: 10.32 mg/dL — ABNORMAL HIGH (ref 0.50–1.35)
Glucose, Bld: 207 mg/dL — ABNORMAL HIGH (ref 70–99)
Potassium: 5.3 mEq/L — ABNORMAL HIGH (ref 3.5–5.1)

## 2011-11-05 LAB — CARDIAC PANEL(CRET KIN+CKTOT+MB+TROPI)
CK, MB: 1.7 ng/mL (ref 0.3–4.0)
Relative Index: INVALID (ref 0.0–2.5)
Total CK: 47 U/L (ref 7–232)
Troponin I: <0.3 ng/mL (ref <0.30)
Troponin I: <0.3 ng/mL (ref <0.30)

## 2011-11-05 LAB — EXPECTORATED SPUTUM ASSESSMENT W GRAM STAIN, RFLX TO RESP C

## 2011-11-05 LAB — TSH: TSH: 2.109 u[IU]/mL (ref 0.350–4.500)

## 2011-11-05 LAB — HEMOGLOBIN A1C: Mean Plasma Glucose: 108 mg/dL (ref <117)

## 2011-11-05 LAB — C4 COMPLEMENT: Complement C4, Body Fluid: 42 mg/dL — ABNORMAL HIGH (ref 10–40)

## 2011-11-05 MED ORDER — SODIUM CHLORIDE 0.9 % IV SOLN
1.0000 g | Freq: Once | INTRAVENOUS | Status: AC
  Administered 2011-11-05: 1 g via INTRAVENOUS
  Filled 2011-11-05: qty 10

## 2011-11-05 MED ORDER — SODIUM POLYSTYRENE SULFONATE 15 GM/60ML PO SUSP
45.0000 g | Freq: Once | ORAL | Status: AC
  Administered 2011-11-05: 45 g via ORAL
  Filled 2011-11-05: qty 180

## 2011-11-05 MED ORDER — BISACODYL 10 MG RE SUPP
10.0000 mg | Freq: Once | RECTAL | Status: AC
  Administered 2011-11-05: 10 mg via RECTAL
  Filled 2011-11-05: qty 1

## 2011-11-05 MED ORDER — BIOTENE DRY MOUTH MT LIQD: 15.0000 mL | Freq: Two times a day (BID) | OROMUCOSAL | Status: DC

## 2011-11-05 MED ORDER — BOOST / RESOURCE BREEZE PO LIQD
1.0000 | Freq: Three times a day (TID) | ORAL | Status: DC
  Administered 2011-11-05 – 2011-11-08 (×8): 1 via ORAL

## 2011-11-05 NOTE — Progress Notes (Signed)
[facility]  Interim history: Jeremy Mccoy is an 46 y.o. male long time crack cocaine user, former alcoholic, smoker who has not seen a physician or care provider in the past 6 years and does not have a regular physician reports that he began to feel left rib pain approximately 2 weeks ago. His symptoms progressed to pain in the epigastric area of his abdomen that felt like air trapped in the abdomen and gas. The pain became more severe and the patient developed nausea and dizziness and he had trouble sleeping.  He admits to   Consultants: Nephrology   Antibiotics: Levaquin  Subjective: Feels great and "ready to fly". Abdominal bloating is gone. Pain in chest and abdomen is also completely resolved.   Objective: Blood pressure 126/76, pulse 72, temperature 98.1 F (36.7 C), temperature source Oral, resp. rate 19, height 6' (1.829 m), weight 72.9 kg (160 lb 11.5 oz), SpO2 100.00%. Weight change:   Intake/Output Summary (Last 24 hours) at 11/05/11 1616 Last data filed at 11/05/11 1300  Gross per 24 hour  Intake   1703 ml  Output   1202 ml  Net    501 ml    Physical Exam: General appearance: alert, cooperative and no distress Throat: lips, mucosa, and tongue normal; teeth and gums normal Lungs: clear to auscultation bilaterally Chest wall: no tenderness Abdomen: soft, non-tender; bowel sounds normal; no masses,  no organomegaly Extremities: extremities normal, atraumatic, no cyanosis or edema Skin: Skin color, texture, turgor normal. No rashes or lesions  Lab Results:  Basename 11/05/11 0430 11/04/11 2218 11/04/11 1658  NA 136 137 --  K 6.4* 5.8* --  CL 106 105 --  CO2 15* 17* --  GLUCOSE 129* 93 --  BUN 103* 106* --  CREATININE 10.38* 10.50* --  CALCIUM 8.9 8.8 --  MG -- -- 2.2  PHOS -- 6.1* 6.6*    Basename 11/05/11 0430 11/04/11 2218 11/04/11 1658  AST 6 -- 6  ALT <5 -- <5  ALKPHOS 58 -- 65  BILITOT 0.2* -- 0.1*  PROT 6.8 -- 7.6  ALBUMIN 3.1* 3.0*  --    Basename 11/04/11 1405  LIPASE 33  AMYLASE --    Basename 11/05/11 0430 11/04/11 1405  WBC 5.1 5.0  NEUTROABS -- 3.5  HGB 8.3* 8.4*  HCT 24.4* 24.5*  MCV 83.8 84.8  PLT 169 171    Basename 11/05/11 0936 11/04/11 2357 11/04/11 1658  CKTOTAL 47 54 66  CKMB 1.7 1.7 2.0  CKMBINDEX -- -- --  TROPONINI <0.30 <0.30 <0.30   No components found with this basename: POCBNP:3 No results found for this basename: DDIMER:2 in the last 72 hours  Basename 11/04/11 1658  HGBA1C 5.4   No results found for this basename: CHOL:2,HDL:2,LDLCALC:2,TRIG:2,CHOLHDL:2,LDLDIRECT:2 in the last 72 hours  Basename 11/05/11 0430  TSH 1.512  T4TOTAL --  T3FREE --  THYROIDAB --    Basename 11/04/11 1503  VITAMINB12 412  FOLATE 5.9  FERRITIN 286  TIBC 218  IRON 52  RETICCTPCT 0.5    Micro Results: Recent Results (from the past 240 hour(s))  MRSA PCR SCREENING     Status: Normal   Collection Time   11/04/11  6:20 PM      Component Value Range Status Comment   MRSA by PCR NEGATIVE  NEGATIVE Final   CULTURE, EXPECTORATED SPUTUM-ASSESSMENT     Status: Normal   Collection Time   11/05/11 12:25 PM      Component Value Range Status Comment  Specimen Description SPUTUM   Final    Special Requests NONE   Final    Sputum evaluation     Final    Value: THIS SPECIMEN IS ACCEPTABLE. RESPIRATORY CULTURE REPORT TO FOLLOW.   Report Status 11/05/2011 FINAL   Final     Studies/Results: Dg Chest 2 View  11/04/2011  *RADIOLOGY REPORT*  Clinical Data: Chest pain cough.  CHEST - 2 VIEW  Comparison: 12/04/2003  Findings: Hilar fullness is similar to prior.  Cardiac contour within normal limits.  There is a mild interstitial prominence and peribronchial thickening.  No focal consolidation, pleural effusion, or pneumothorax.  No acute osseous finding.  There is mild wedge deformity of a lower thoracic vertebral body, similar to prior.  IMPRESSION: Mild interstitial prominence / peribronchial thickening is  nonspecific.  May reflect chronic bronchitic change (particularly if there is a smoking history) or seen with atypical infection.  No focal consolidation.  Original Report Authenticated By: Suanne Marker, M.D.   Abd 1 View (kub)  11/04/2011  *RADIOLOGY REPORT*  Clinical Data: Abdominal pain.  ABDOMEN - 1 VIEW  Comparison: None.  Findings: Bowel gas pattern unremarkable without evidence of obstruction or significant ileus.  Gas throughout normal caliber colon and gas within several nondistended loops of small bowel. Phlebolith in the right side of the pelvis.  No visible opaque urinary tract calculi.  Regional skeleton unremarkable.  IMPRESSION: No acute abdominal abnormality.  Original Report Authenticated By: Deniece Portela, M.D.   US Renal  11/04/2011  *RADIOLOGY REPORT*  Clinical Data: Elevated BUN and creatinine, hematuria  RENAL/URINARY TRACT ULTRASOUND COMPLETE  Comparison:  None.  Findings:  Right Kidney:  14.1 cm length.  Normal cortical thickness. Increased cortical echogenicity.  No mass, hydronephrosis or shadowing calcification.  No perinephric fluid.  Left Kidney:  15.0 cm length.  Normal cortical thickness. Increased cortical echogenicity.  No mass, hydronephrosis shadowing calcification.  No perinephric fluid.  Bladder:  Normal appearance  IMPRESSION: Mildly enlarged kidneys bilaterally demonstrating increased cortical echogenicity suggesting medical renal disease. No renal mass or evidence of urinary tract obstruction identified.  Original Report Authenticated By: Burnetta Sabin, M.D.    Medications: Scheduled Meds:   . antiseptic oral rinse  15 mL Mouth Rinse q12n4p  . bisacodyl  10 mg Rectal Once  . calcium gluconate  1 g Intravenous Once  . feeding supplement  1 Container Oral TID BM  . levofloxacin (LEVAQUIN) IV  500 mg Intravenous Q48H  . levofloxacin (LEVAQUIN) IV  750 mg Intravenous Once  . methylPREDNISolone (SOLU-MEDROL) injection  500 mg Intravenous Q24H  .  pantoprazole  40 mg Oral BID AC  . sodium chloride  3 mL Intravenous Q12H  . sodium polystyrene  45 g Oral Once  . sodium polystyrene  45 g Oral Once  . DISCONTD: pantoprazole (PROTONIX) IV  40 mg Intravenous Q24H   Continuous Infusions:   . sodium chloride 50 mL/hr at 11/05/11 0900  .  sodium bicarbonate infusion 1000 mL 150 mL/hr at 11/05/11 1224   PRN Meds:.albuterol, ondansetron (ZOFRAN) IV, ondansetron  Assessment/Plan:   Acute renal failure/ Hematuria ? ATN from cocaine use- possibility of pulmonary renal syndrome as well, therefore he has been started on high dose steroids.    Hyperkalemia Due to renal failure- improving with kayexalate   Abdominal pain  Likely from cocaine abuse- resolved   Polysubstance abuse counseled    Abnormal weight loss Due to acute illness   Anemia Black stools/ Iron panel  normal but due to c/o black stools, check hemoccult   Coagulopathy Elevated ptt- mixing study ordered   Hemoptysis Only one episode on the day before admission. None currently   Shaking chills No infectious cause found as of yet   Color blindness   Tobacco abuse   Code Status: full  Family Communication: family in room Disposition: keep in  SDU until K+ improves  Larisha Vencill [phone] 11/05/2011, 4:16 PM  LOS: 1 day

## 2011-11-05 NOTE — Progress Notes (Signed)
Subjective:  No new complaints, taking about food.  K is up again this AM.  Made fairly normal amount of urine overnight, is dark.  BUN and creatinine are essentially unchanged.  No more hemoptysis. Complements normal, hep C negative.  Other labs pending.   Objective Vital signs in last 24 hours: Filed Vitals:   11/04/11 2100 11/05/11 0000 11/05/11 0400 11/05/11 0813  BP:  120/73 105/50 112/62  Pulse:  72 65 59  Temp:  98.6 F (37 C) 98.3 F (36.8 C) 97.7 F (36.5 C)  TempSrc:  Oral Oral Oral  Resp:  18 20 18   Height:      Weight:  72.9 kg (160 lb 11.5 oz)    SpO2: 100% 98% 97% 97%   Weight change:   Intake/Output Summary (Last 24 hours) at 11/05/11 0815 Last data filed at 11/05/11 0000  Gross per 24 hour  Intake      3 ml  Output    751 ml  Net   -748 ml   Labs: Basic Metabolic Panel:  Lab [id number] 0430 11/04/11 2218 11/04/11 1658 11/04/11 1405  NA 136 137 -- 135  K 6.4* 5.8* -- 6.0*  CL 106 105 -- 104  CO2 15* 17* -- 16*  GLUCOSE 129* 93 -- 80  BUN 103* 106* -- 107*  CREATININE 10.38* 10.50* -- 10.64*  CALCIUM 8.9 8.8 -- 9.0  ALB -- -- -- --  PHOS -- 6.1* 6.6* --   Liver Function Tests:  Lab 11/05/11 0430 11/04/11 2218 11/04/11 1658 11/04/11 1405  AST 6 -- 6 5  ALT <5 -- <5 <5  ALKPHOS 58 -- 65 59  BILITOT 0.2* -- 0.1* 0.1*  PROT 6.8 -- 7.6 7.1  ALBUMIN 3.1* 3.0* 3.5 --    Lab 11/04/11 1405  LIPASE 33  AMYLASE --   No results found for this basename: AMMONIA:3 in the last 168 hours CBC:  Lab 11/05/11 0430 11/04/11 1405  WBC 5.1 5.0  NEUTROABS -- 3.5  HGB 8.3* 8.4*  HCT 24.4* 24.5*  MCV 83.8 84.8  PLT 169 171   Cardiac Enzymes:  Lab 11/04/11 2357 11/04/11 1658  CKTOTAL 54 66  CKMB 1.7 2.0  CKMBINDEX -- --  TROPONINI <0.30 <0.30   CBG: No results found for this basename: GLUCAP:5 in the last 168 hours  Iron Studies:  Basename 11/04/11 1503  IRON 52  TIBC 218  TRANSFERRIN --  FERRITIN 286   Studies/Results: Dg Chest 2  View  11/04/2011  *RADIOLOGY REPORT*  Clinical Data: Chest pain cough.  CHEST - 2 VIEW  Comparison: 12/04/2003  Findings: Hilar fullness is similar to prior.  Cardiac contour within normal limits.  There is Mccoy mild interstitial prominence and peribronchial thickening.  No focal consolidation, pleural effusion, or pneumothorax.  No acute osseous finding.  There is mild wedge deformity of Mccoy lower thoracic vertebral body, similar to prior.  IMPRESSION: Mild interstitial prominence / peribronchial thickening is nonspecific.  May reflect chronic bronchitic change (particularly if there is Mccoy smoking history) or seen with atypical infection.  No focal consolidation.  Original Report Authenticated By: Suanne Marker, M.D.   Abd 1 View (kub)  11/04/2011  *RADIOLOGY REPORT*  Clinical Data: Abdominal pain.  ABDOMEN - 1 VIEW  Comparison: None.  Findings: Bowel gas pattern unremarkable without evidence of obstruction or significant ileus.  Gas throughout normal caliber colon and gas within several nondistended loops of small bowel. Phlebolith in the right side of the pelvis.  No visible opaque urinary tract calculi.  Regional skeleton unremarkable.  IMPRESSION: No acute abdominal abnormality.  Original Report Authenticated By: Deniece Portela, M.D.   US Renal  11/04/2011  *RADIOLOGY REPORT*  Clinical Data: Elevated BUN and creatinine, hematuria  RENAL/URINARY TRACT ULTRASOUND COMPLETE  Comparison:  None.  Findings:  Right Kidney:  14.1 cm length.  Normal cortical thickness. Increased cortical echogenicity.  No mass, hydronephrosis or shadowing calcification.  No perinephric fluid.  Left Kidney:  15.0 cm length.  Normal cortical thickness. Increased cortical echogenicity.  No mass, hydronephrosis shadowing calcification.  No perinephric fluid.  Bladder:  Normal appearance  IMPRESSION: Mildly enlarged kidneys bilaterally demonstrating increased cortical echogenicity suggesting medical renal disease. No renal mass or  evidence of urinary tract obstruction identified.  Original Report Authenticated By: Burnetta Sabin, M.D.   Medications: Infusions:    . sodium chloride 100 mL/hr at 11/05/11 0620  .  sodium bicarbonate infusion 1000 mL 75 mL/hr at 11/05/11 0200    Scheduled Medications:    . sodium chloride   Intravenous Once  . bisacodyl  10 mg Rectal Once  . calcium gluconate  1 g Intravenous Once  . levofloxacin (LEVAQUIN) IV  500 mg Intravenous Q48H  . levofloxacin (LEVAQUIN) IV  750 mg Intravenous Once  . methylPREDNISolone (SOLU-MEDROL) injection  500 mg Intravenous Q24H  . pantoprazole  40 mg Oral BID AC  . sodium chloride  3 mL Intravenous Q12H  . sodium polystyrene  30 g Oral Once  . sodium polystyrene  45 g Oral Once  . sodium polystyrene  45 g Oral Once  . DISCONTD: pantoprazole (PROTONIX) IV  40 mg Intravenous Q24H    have reviewed scheduled and prn medications.  Physical Exam: General: alert, appropriate, NAD Heart: RRR Lungs: mostly clear Abdomen: soft, non tender Extremities: no edema   I Assessment/ Plan: Pt is Mccoy 46 y.o. yo white male who was admitted on 11/04/2011 with elevated creatinine with unknown baseline  Assessment/Plan: 1. Renal- elevated creatinine of unknown etiology. His urinalysis shows hematuria. His renal ultrasound shows normal-sized kidneys with some increased cortical echogenicity with no hydro-. Therefore, hopefully this indicates an acute process that might ? be reversible. Given that he has hematuria and hemoptysis this raises the issue of Mccoy possible pulmonary- renal syndrome. The appropriate serologies are pending (ANA, ANCA., anti-GBM ). Since it is now the weekend Mccoy kidney biopsy will not be able to be obtained until Monday. I will start him on high-dose steroids empirically. Of course the other etiologies of his elevated creatinine could be more benign and may be ATN and GI bleeding from decreased oral intake, or from Mccoy cocaine-induced hypertensive  kidney disease. Will continue to hydrate him with fluids and we will monitor his kidney function, and ins and outs closely. I really do not know his renal prognosis at this time. There are no acute indications for dialysis as long as we can treat his potassium medically.  2. Hypertension/volume - interesting that he is not hypertensive in the setting of renal failure. This may mean that he is dry. Will hydrate with fluids as above.  3. Hyperkalemia- could be due to GI bleeding, could be swallowing blood from Mccoy pulmonary source. Have given Mccoy dose of Kayexalate last night, now back up.  Patient does report Mccoy high potassium diet at home. Will get another dose of kaexylate this AM and I will increase his bicarb infusion for Mccoy bicarb in the teens. Will recheck K this  PM 4. Metabolic acidosis- due to renal failure. Will start Mccoy bicarbonate drip.  5. Anemia - either do to chronic kidney disease or bleeding either from Mccoy pulmonary or GI source. Is not low enough to need transfusion at this time. However with hydration it may lower. INR and platelets are normal.      Jeremy Mccoy   11/05/2011,8:15 AM  LOS: 1 day

## 2011-11-05 NOTE — Progress Notes (Signed)
INITIAL ADULT NUTRITION ASSESSMENT Date: 11/05/2011   Time: 10:09 AM Reason for Assessment: MD consult-abnormal weight loss  ASSESSMENT: Male 46 y.o.  Dx: Acute renal failure  Hx:  Past Medical History  Diagnosis Date  . COPD (chronic obstructive pulmonary disease)   . Smoker   . Crack cocaine use   . Dental caries    Related Meds:     . sodium chloride   Intravenous Once  . bisacodyl  10 mg Rectal Once  . calcium gluconate  1 g Intravenous Once  . levofloxacin (LEVAQUIN) IV  500 mg Intravenous Q48H  . levofloxacin (LEVAQUIN) IV  750 mg Intravenous Once  . methylPREDNISolone (SOLU-MEDROL) injection  500 mg Intravenous Q24H  . pantoprazole  40 mg Oral BID AC  . sodium chloride  3 mL Intravenous Q12H  . sodium polystyrene  30 g Oral Once  . sodium polystyrene  45 g Oral Once  . sodium polystyrene  45 g Oral Once  . DISCONTD: pantoprazole (PROTONIX) IV  40 mg Intravenous Q24H    Ht: 6' (182.9 cm)  Wt: 160 lb 11.5 oz (72.9 kg)  Ideal Wt: 80.9 kg % Ideal Wt: 90%  Usual Wt: 165 lbs per pt Wt Readings from Last 10 Encounters:  11/05/11 160 lb 11.5 oz (72.9 kg)   % Usual Wt: 97%  Body mass index is 21.80 kg/(m^2). WNL  Food/Nutrition Related Hx: per pt his wt has been very stable until about 2 weeks ago when he started to lose wt. Pt reports 3% wt loss over that time due to a decreased appetite. 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 When he returns from his job he isolates himself and smokes cocaine. During the time he is doing drugs he will sometimes go for days without eating. Over the last 2 weeks he has felt poorly and has ate less due to this. Pt is hungry now and want so eat. Pt reports 100% meal completion at Breakfast. Pt is willing to drink Resource.  Labs:  CMP     Component Value Date/Time   NA 136 11/05/2011 0430   K 6.4*  11/05/2011 0430   CL 106 11/05/2011 0430   CO2 15* 11/05/2011 0430   GLUCOSE 129* 11/05/2011 0430   BUN 103* 11/05/2011 0430   CREATININE 10.38* 11/05/2011 0430   CALCIUM 8.9 11/05/2011 0430   PROT 6.8 11/05/2011 0430   ALBUMIN 3.1* 11/05/2011 0430   AST 6 11/05/2011 0430   ALT <5 11/05/2011 0430   ALKPHOS 58 11/05/2011 0430   BILITOT 0.2* 11/05/2011 0430   GFRNONAA 5* 11/05/2011 0430   GFRAA 6* 11/05/2011 0430   Potassium  Date/Time Value Range Status  11/05/2011  4:30 AM 6.4* 3.5 - 5.1 mEq/L Final     CRITICAL RESULT CALLED TO, READ BACK BY AND VERIFIED WITHWellington Hampshire RN [id number] GADDY  11/04/2011 10:18 PM 5.8* 3.5 - 5.1 mEq/L Final  11/04/2011  2:05 PM 6.0* 3.5 - 5.1 mEq/L Final   Phosphorus  Date/Time Value Range Status  11/04/2011 10:18 PM 6.1* 2.3 - 4.6 mg/dL Final  11/04/2011  4:58 PM 6.6* 2.3 - 4.6 mg/dL Final   Magnesium  Date/Time Value Range Status  11/04/2011  4:58 PM 2.2  1.5 - 2.5 mg/dL Final    Intake/Output Summary (Last 24 hours)  at 11/05/11 1011 Last data filed at 11/05/11 0930  Gross per 24 hour  Intake    363 ml  Output   1201 ml  Net   -838 ml    Diet Order: Clear Liquid  Supplements/Tube Feeding: none  IVF:    sodium chloride Last Rate: 50 mL/hr at 11/05/11 0845  sodium bicarbonate infusion 1000 mL Last Rate: 150 mL/hr at 11/05/11 0847   Pt admitted in ARF with hyperkalemia and metabolic acidosis. Pt with hx of cocaine abuse. No plans for HD currently, hyperkalemia being treated with medications.   Estimated Nutritional Needs:   Kcal:  2200-2400 Protein:  90-110 grams Fluid:  >2.3 L/day  NUTRITION DIAGNOSIS: -Unintended wt loss Status:  Ongoing  RELATED TO: drug use, poor appetite  AS EVIDENCE BY: 3% wt loss PTA  MONITORING/EVALUATION(Goals): Goal: Pt to meet >/= 90% of their estimated nutrition needs Monitor: po intake, weight, labs  EDUCATION NEEDS: -No education needs identified at this time  INTERVENTION:  Resource Breeze  TID   DOCUMENTATION CODES Per approved criteria  -Not Applicable    [place], [place], [place] weekend pager  11/05/2011, 10:09 AM

## 2011-11-05 NOTE — Progress Notes (Signed)
CRITICAL VALUE ALERT  Critical value received:  K=6.4  Date of notification:  11/05/2011  Time of notification:  0615  Critical value read back:yes  Nurse who received alert:  Ninfa Meeker, RN  MD notified (1st page):  Walden Field  Time of first page:  0615  MD notified (2nd page):  Time of second page:  Responding MD:  Walden Field  Time MD responded:  [phone]

## 2011-11-06 ENCOUNTER — Encounter (HOSPITAL_COMMUNITY): Payer: Self-pay | Admitting: Radiology

## 2011-11-06 DIAGNOSIS — N179 Acute kidney failure, unspecified: Secondary | ICD-10-CM

## 2011-11-06 DIAGNOSIS — R042 Hemoptysis: Secondary | ICD-10-CM

## 2011-11-06 DIAGNOSIS — E875 Hyperkalemia: Secondary | ICD-10-CM

## 2011-11-06 DIAGNOSIS — F1910 Other psychoactive substance abuse, uncomplicated: Secondary | ICD-10-CM

## 2011-11-06 LAB — URINE CULTURE: Culture  Setup Time: 201306220158

## 2011-11-06 LAB — RENAL FUNCTION PANEL
Albumin: 2.7 g/dL — ABNORMAL LOW (ref 3.5–5.2)
Calcium: 8 mg/dL — ABNORMAL LOW (ref 8.4–10.5)
GFR calc Af Amer: 6 mL/min — ABNORMAL LOW (ref >90)
Glucose, Bld: 213 mg/dL — ABNORMAL HIGH (ref 70–99)
Phosphorus: 7.4 mg/dL — ABNORMAL HIGH (ref 2.3–4.6)
Sodium: 138 mEq/L (ref 135–145)

## 2011-11-06 MED ORDER — DARBEPOETIN ALFA-POLYSORBATE 100 MCG/0.5ML IJ SOLN
100.0000 ug | INTRAMUSCULAR | Status: DC
  Administered 2011-11-06 – 2011-11-13 (×2): 100 ug via SUBCUTANEOUS
  Filled 2011-11-06 (×3): qty 0.5

## 2011-11-06 MED ORDER — PNEUMOCOCCAL VAC POLYVALENT 25 MCG/0.5ML IJ INJ
0.5000 mL | INJECTION | INTRAMUSCULAR | Status: AC
  Filled 2011-11-06: qty 0.5

## 2011-11-06 NOTE — Progress Notes (Signed)
Subjective:  No new complaints, taking about food.  K is better.  Made fairly normal amount of urine overnight, is increasing in volume.  BUN and creatinine are essentially unchanged.  No more hemoptysis. Complements normal, hep C negative.  Other labs pending.   Objective Vital signs in last 24 hours: Filed Vitals:   11/05/11 1700 11/05/11 2020 11/05/11 2343 11/06/11 0437  BP: 132/68 142/79 124/72 122/76  Pulse: 86 85 76 66  Temp:  97.7 F (36.5 C) 98 F (36.7 C) 98.1 F (36.7 C)  TempSrc:  Oral Oral Oral  Resp: 23 15 19 18   Height:      Weight:    76.5 kg (168 lb 10.4 oz)  SpO2:  100% 97% 99%   Weight change: 5.1 kg (11 lb 3.9 oz)  Intake/Output Summary (Last 24 hours) at 11/06/11 0826 Last data filed at 11/06/11 0600  Gross per 24 hour  Intake   5560 ml  Output   1252 ml  Net   4308 ml   Labs: Basic Metabolic Panel:  Lab [id number] 0344 11/05/11 1724 11/05/11 1250 11/04/11 2218  NA 138 136 136 --  K 4.3 4.3 5.3* --  CL 98 98 101 --  CO2 23 19 17* --  GLUCOSE 213* 230* 207* --  BUN 106* 105* 106* --  CREATININE 10.01* 10.06* 10.32* --  CALCIUM 8.0* 8.6 9.2 --  ALB -- -- -- --  PHOS 7.4* -- 6.4* 6.1*   Liver Function Tests:  Lab 11/06/11 0344 11/05/11 1250 11/05/11 0430 11/04/11 1658 11/04/11 1405  AST -- -- 6 6 5   ALT -- -- <5 <5 <5  ALKPHOS -- -- 58 65 59  BILITOT -- -- 0.2* 0.1* 0.1*  PROT -- -- 6.8 7.6 7.1  ALBUMIN 2.7* 2.8* 3.1* -- --    Lab 11/04/11 1405  LIPASE 33  AMYLASE --   No results found for this basename: AMMONIA:3 in the last 168 hours CBC:  Lab 11/05/11 0430 11/04/11 1405  WBC 5.1 5.0  NEUTROABS -- 3.5  HGB 8.3* 8.4*  HCT 24.4* 24.5*  MCV 83.8 84.8  PLT 169 171   Cardiac Enzymes:  Lab 11/05/11 0936 11/04/11 2357 11/04/11 1658  CKTOTAL 47 54 66  CKMB 1.7 1.7 2.0  CKMBINDEX -- -- --  TROPONINI <0.30 <0.30 <0.30   CBG: No results found for this basename: GLUCAP:5 in the last 168 hours  Iron Studies:   Basename 11/04/11 1503   IRON 52  TIBC 218  TRANSFERRIN --  FERRITIN 286   Studies/Results: Dg Chest 2 View  11/04/2011  *RADIOLOGY REPORT*  Clinical Data: Chest pain cough.  CHEST - 2 VIEW  Comparison: 12/04/2003  Findings: Hilar fullness is similar to prior.  Cardiac contour within normal limits.  There is a mild interstitial prominence and peribronchial thickening.  No focal consolidation, pleural effusion, or pneumothorax.  No acute osseous finding.  There is mild wedge deformity of a lower thoracic vertebral body, similar to prior.  IMPRESSION: Mild interstitial prominence / peribronchial thickening is nonspecific.  May reflect chronic bronchitic change (particularly if there is a smoking history) or seen with atypical infection.  No focal consolidation.  Original Report Authenticated By: Suanne Marker, M.D.   Abd 1 View (kub)  11/04/2011  *RADIOLOGY REPORT*  Clinical Data: Abdominal pain.  ABDOMEN - 1 VIEW  Comparison: None.  Findings: Bowel gas pattern unremarkable without evidence of obstruction or significant ileus.  Gas throughout normal caliber colon and gas within  several nondistended loops of small bowel. Phlebolith in the right side of the pelvis.  No visible opaque urinary tract calculi.  Regional skeleton unremarkable.  IMPRESSION: No acute abdominal abnormality.  Original Report Authenticated By: Deniece Portela, M.D.   US Renal  11/04/2011  *RADIOLOGY REPORT*  Clinical Data: Elevated BUN and creatinine, hematuria  RENAL/URINARY TRACT ULTRASOUND COMPLETE  Comparison:  None.  Findings:  Right Kidney:  14.1 cm length.  Normal cortical thickness. Increased cortical echogenicity.  No mass, hydronephrosis or shadowing calcification.  No perinephric fluid.  Left Kidney:  15.0 cm length.  Normal cortical thickness. Increased cortical echogenicity.  No mass, hydronephrosis shadowing calcification.  No perinephric fluid.  Bladder:  Normal appearance  IMPRESSION: Mildly enlarged kidneys bilaterally  demonstrating increased cortical echogenicity suggesting medical renal disease. No renal mass or evidence of urinary tract obstruction identified.  Original Report Authenticated By: Burnetta Sabin, M.D.   Medications: Infusions:    . sodium chloride 50 mL/hr at 11/06/11 0233  .  sodium bicarbonate infusion 1000 mL 150 mL/hr at 11/06/11 [id number]    Scheduled Medications:    . calcium gluconate  1 g Intravenous Once  . feeding supplement  1 Container Oral TID BM  . methylPREDNISolone (SOLU-MEDROL) injection  500 mg Intravenous Q24H  . pantoprazole  40 mg Oral BID AC  . sodium chloride  3 mL Intravenous Q12H  . sodium polystyrene  45 g Oral Once  . sodium polystyrene  45 g Oral Once  . DISCONTD: antiseptic oral rinse  15 mL Mouth Rinse q12n4p  . DISCONTD: levofloxacin (LEVAQUIN) IV  500 mg Intravenous Q48H    have reviewed scheduled and prn medications.  Physical Exam: General: alert, appropriate, NAD Heart: RRR Lungs: mostly clear Abdomen: soft, non tender Extremities: no edema   I Assessment/ Plan: Pt is a 46 y.o. yo white male who was admitted on 11/04/2011 with elevated creatinine with unknown baseline  Assessment/Plan: 1. Renal- elevated creatinine of unknown etiology. His urinalysis shows hematuria. His renal ultrasound shows normal-sized kidneys with some increased cortical echogenicity with no hydro-. Therefore, hopefully this indicates an acute process that might ? be reversible. Given that he has hematuria and hemoptysis this raises the issue of a possible pulmonary- renal syndrome. The appropriate serologies are pending (ANA, ANCA., anti-GBM ). Planning on a kidney biopsy tomorrow. Is on high-dose steroids empirically. Of course the other etiologies of his elevated creatinine could be more benign and may be ATN and GI bleeding from decreased oral intake, or from a cocaine-induced hypertensive kidney disease. Will continue to hydrate him with fluids and we will monitor his  kidney function, and ins and outs closely. I really do not know his renal prognosis at this time. There are no acute indications for dialysis as long as we can treat his potassium medically.  2. Hypertension/volume - interesting that he is not hypertensive in the setting of renal failure. This may mean that he is dry. Continue with fluids as above.  3. Hyperkalemia- could be due to GI bleeding, could be swallowing blood from a pulmonary source. Has trended down with medical management. Continue to follow 4. Metabolic acidosis- due to renal failure. On a  bicarbonate drip.  5. Anemia - either do to chronic kidney disease or bleeding either from a pulmonary or GI source. Is not low enough to need transfusion at this time. However with hydration it may lower. INR and platelets are normal.  Will start aranesp  Raidyn Breiner A   11/06/2011,8:26 AM  LOS: 2 days

## 2011-11-06 NOTE — H&P (Signed)
Chief Complaint: Acute renal insufficiency Referring Physician:[place] HPI: Jeremy Mccoy is an 46 y.o. male with hx of EtOH and cocaine abuse with essentially unknown PMHx because he hasn;t been to a PCP in many years. He was found to have elevated BUN/CR as well as K. He is currently in process of workup, though he is feeling much better. Request for random renal biopsy is noted to aid in diagnosis.  Past Medical History:  Past Medical History  Diagnosis Date  . COPD (chronic obstructive pulmonary disease)   . Smoker   . Crack cocaine use   . Dental caries     Past Surgical History: History reviewed. No pertinent past surgical history.  Family History:  Family History  Problem Relation Age of Onset  . Heart attack Father     Social History:  reports that he has been smoking Cigarettes.  He has a 25 pack-year smoking history. He has never used smokeless tobacco. He reports that he uses illicit drugs (Cocaine and Marijuana). He reports that he does not drink alcohol.  Allergies: No Known Allergies  Medications: Medications Prior to Admission  Medication Sig Dispense Refill  . aspirin 81 MG tablet Take 81 mg by mouth daily.      Marland Kitchen ibuprofen (ADVIL,MOTRIN) 200 MG tablet Take 200 mg by mouth every 6 (six) hours as needed. For pain        Please HPI for pertinent positives, otherwise complete 10 system ROS negative.  Physical Exam: Blood pressure 122/76, pulse 66, temperature 98.1 F (36.7 C), temperature source Oral, resp. rate 18, height 6' (1.829 m), weight 168 lb 10.4 oz (76.5 kg), SpO2 99.00%. Body mass index is 22.87 kg/(m^2).   General Appearance:  Alert, cooperative, no distress, appears stated age  Head:  Normocephalic, without obvious abnormality, atraumatic  ENT: Unremarkable  Neck: Supple, symmetrical, trachea midline, no adenopathy, thyroid: not enlarged, symmetric, no tenderness/mass/nodules  Lungs:   Clear to auscultation bilaterally, no w/r/r,  respirations unlabored without use of accessory muscles.  Heart:  Regular rate and rhythm, S1, S2 normal, no murmur, rub or gallop. Carotids 2+ without bruit.  Abdomen:   Soft, non-tender, non distended. Bowel sounds active all four quadrants,  no masses, no organomegaly.  Extremities: Extremities normal, atraumatic, no cyanosis or edema  Neurologic: Normal affect, no gross deficits.   Labs:  PROTIME-INR     Status: Normal   Collection Time   11/04/11  3:03 PM      Component Value Range Comment   Prothrombin Time 14.8  11.6 - 15.2 seconds    INR 1.14  0.00 - 1.49   APTT     Status: Abnormal   Collection Time   11/04/11  3:03 PM      Component Value Range Comment   aPTT 60 (*) 24 - 37 seconds   RENAL FUNCTION PANEL     Status: Abnormal   Collection Time   11/06/11  3:44 AM      Component Value Range Comment   Sodium 138  135 - 145 mEq/L    Potassium 4.3  3.5 - 5.1 mEq/L    Chloride 98  96 - 112 mEq/L    CO2 23  19 - 32 mEq/L    Glucose, Bld 213 (*) 70 - 99 mg/dL    BUN 106 (*) 6 - 23 mg/dL    Creatinine, Ser 10.01 (*) 0.50 - 1.35 mg/dL    Calcium 8.0 (*) 8.4 - 10.5 mg/dL    Phosphorus  7.4 (*) 2.3 - 4.6 mg/dL    Albumin 2.7 (*) 3.5 - 5.2 g/dL    GFR calc non Af Amer 6 (*) >90 mL/min    GFR calc Af Amer 6 (*) >90 mL/min    CBC    Component Value Date/Time   WBC 5.1 11/05/2011 0430   RBC 2.91* 11/05/2011 0430   HGB 8.3* 11/05/2011 0430   HCT 24.4* 11/05/2011 0430   PLT 169 11/05/2011 0430   MCV 83.8 11/05/2011 0430   MCH 28.5 11/05/2011 0430   MCHC 34.0 11/05/2011 0430   RDW 12.8 11/05/2011 0430   LYMPHSABS 1.1 11/04/2011 1405   MONOABS 0.2 11/04/2011 1405   EOSABS 0.1 11/04/2011 1405   BASOSABS 0.0 11/04/2011 1405     Dg Chest 2 View  11/04/2011  *RADIOLOGY REPORT*  Clinical Data: Chest pain cough.  CHEST - 2 VIEW  Comparison: 12/04/2003  Findings: Hilar fullness is similar to prior.  Cardiac contour within normal limits.  There is a mild interstitial prominence and peribronchial  thickening.  No focal consolidation, pleural effusion, or pneumothorax.  No acute osseous finding.  There is mild wedge deformity of a lower thoracic vertebral body, similar to prior.  IMPRESSION: Mild interstitial prominence / peribronchial thickening is nonspecific.  May reflect chronic bronchitic change (particularly if there is a smoking history) or seen with atypical infection.  No focal consolidation.  Original Report Authenticated By: Suanne Marker, M.D.   Abd 1 View (kub)  11/04/2011  *RADIOLOGY REPORT*  Clinical Data: Abdominal pain.  ABDOMEN - 1 VIEW  Comparison: None.  Findings: Bowel gas pattern unremarkable without evidence of obstruction or significant ileus.  Gas throughout normal caliber colon and gas within several nondistended loops of small bowel. Phlebolith in the right side of the pelvis.  No visible opaque urinary tract calculi.  Regional skeleton unremarkable.  IMPRESSION: No acute abdominal abnormality.  Original Report Authenticated By: Deniece Portela, M.D.   US Renal  11/04/2011  *RADIOLOGY REPORT*  Clinical Data: Elevated BUN and creatinine, hematuria  RENAL/URINARY TRACT ULTRASOUND COMPLETE  Comparison:  None.  Findings:  Right Kidney:  14.1 cm length.  Normal cortical thickness. Increased cortical echogenicity.  No mass, hydronephrosis or shadowing calcification.  No perinephric fluid.  Left Kidney:  15.0 cm length.  Normal cortical thickness. Increased cortical echogenicity.  No mass, hydronephrosis shadowing calcification.  No perinephric fluid.  Bladder:  Normal appearance  IMPRESSION: Mildly enlarged kidneys bilaterally demonstrating increased cortical echogenicity suggesting medical renal disease. No renal mass or evidence of urinary tract obstruction identified.  Original Report Authenticated By: Burnetta Sabin, M.D.    Assessment/Plan Elevated BUN/Cr, ARI Discussed procedure of US guided random renal biopsy with pt. Risks, complications explained in  detail. Labs ok, except will recheck PTT Consent signed in chart. Procedure for tomorrow. NPO p MN, no anticoag   Hafsah Hendler PA-C 11/06/2011, 8:21 AM

## 2011-11-06 NOTE — Progress Notes (Signed)
[facility]  Interim history: Jeremy Mccoy is an 46 y.o. male long time crack cocaine user, former alcoholic, smoker who has not seen a physician or care provider in the past 6 years and does not have a regular physician reports that he began to feel left rib pain approximately 2 weeks ago. His symptoms progressed to pain in the epigastric area of his abdomen that felt like air trapped in the abdomen and gas. The pain became more severe and the patient developed nausea and dizziness and he had trouble sleeping.  He admits to   Consultants: Nephrology   Antibiotics: Levaquin  Subjective: No complaints today. Asking if he really needs the biopsy of his kidney.   Objective: Blood pressure 119/66, pulse 72, temperature 98.1 F (36.7 C), temperature source Oral, resp. rate 15, height 6' (1.829 m), weight 76.5 kg (168 lb 10.4 oz), SpO2 100.00%. Weight change: 5.1 kg (11 lb 3.9 oz)  Intake/Output Summary (Last 24 hours) at 11/06/11 1228 Last data filed at 11/06/11 1100  Gross per 24 hour  Intake   5200 ml  Output   1650 ml  Net   3550 ml    Physical Exam: General appearance: alert, cooperative and no distress Throat: lips, mucosa, and tongue normal; teeth and gums normal Lungs: clear to auscultation bilaterally Chest wall: no tenderness Abdomen: soft, non-tender; bowel sounds normal; no masses,  no organomegaly Extremities: extremities normal, atraumatic, no cyanosis or edema Skin: Skin color, texture, turgor normal. No rashes or lesions  Lab Results:  Basename 11/06/11 0344 11/05/11 1724 11/05/11 1250 11/04/11 1658  NA 138 136 -- --  K 4.3 4.3 -- --  CL 98 98 -- --  CO2 23 19 -- --  GLUCOSE 213* 230* -- --  BUN 106* 105* -- --  CREATININE 10.01* 10.06* -- --  CALCIUM 8.0* 8.6 -- --  MG -- -- -- 2.2  PHOS 7.4* -- 6.4* --    Basename 11/06/11 0344 11/05/11 1250 11/05/11 0430 11/04/11 1658  AST -- -- 6 6  ALT -- -- <5 <5  ALKPHOS -- -- 58 65  BILITOT -- --  0.2* 0.1*  PROT -- -- 6.8 7.6  ALBUMIN 2.7* 2.8* -- --    Basename 11/04/11 1405  LIPASE 33  AMYLASE --    Basename 11/05/11 0430 11/04/11 1405  WBC 5.1 5.0  NEUTROABS -- 3.5  HGB 8.3* 8.4*  HCT 24.4* 24.5*  MCV 83.8 84.8  PLT 169 171    Basename 11/05/11 0936 11/04/11 2357 11/04/11 1658  CKTOTAL 47 54 66  CKMB 1.7 1.7 2.0  CKMBINDEX -- -- --  TROPONINI <0.30 <0.30 <0.30   No components found with this basename: POCBNP:3 No results found for this basename: DDIMER:2 in the last 72 hours  Basename 11/04/11 1658  HGBA1C 5.4   No results found for this basename: CHOL:2,HDL:2,LDLCALC:2,TRIG:2,CHOLHDL:2,LDLDIRECT:2 in the last 72 hours  Basename 11/05/11 0430  TSH 1.512  T4TOTAL --  T3FREE --  THYROIDAB --    Basename 11/04/11 1503  VITAMINB12 412  FOLATE 5.9  FERRITIN 286  TIBC 218  IRON 52  RETICCTPCT 0.5    Micro Results: Recent Results (from the past 240 hour(s))  URINE CULTURE     Status: Normal   Collection Time   11/04/11  2:10 PM      Component Value Range Status Comment   Specimen Description URINE, CLEAN CATCH   Final    Special Requests NONE   Final  Culture  Setup Time [id number]   Final    Colony Count NO GROWTH   Final    Culture NO GROWTH   Final    Report Status 11/06/2011 FINAL   Final   CULTURE, BLOOD (ROUTINE X 2)     Status: Normal (Preliminary result)   Collection Time   11/04/11  5:20 PM      Component Value Range Status Comment   Specimen Description BLOOD HAND LEFT   Final    Special Requests BOTTLES DRAWN AEROBIC ONLY 3CC   Final    Culture  Setup Time [id number]   Final    Culture     Final    Value:        BLOOD CULTURE RECEIVED NO GROWTH TO DATE CULTURE WILL BE HELD FOR 5 DAYS BEFORE ISSUING A FINAL NEGATIVE REPORT   Report Status PENDING   Incomplete   CULTURE, BLOOD (ROUTINE X 2)     Status: Normal (Preliminary result)   Collection Time   11/04/11  5:31 PM      Component Value Range Status Comment   Specimen  Description BLOOD ARM RIGHT   Final    Special Requests BOTTLES DRAWN AEROBIC AND ANAEROBIC 10CC   Final    Culture  Setup Time [id number]   Final    Culture     Final    Value:        BLOOD CULTURE RECEIVED NO GROWTH TO DATE CULTURE WILL BE HELD FOR 5 DAYS BEFORE ISSUING A FINAL NEGATIVE REPORT   Report Status PENDING   Incomplete   MRSA PCR SCREENING     Status: Normal   Collection Time   11/04/11  6:20 PM      Component Value Range Status Comment   MRSA by PCR NEGATIVE  NEGATIVE Final   URINE CULTURE     Status: Normal   Collection Time   11/04/11  8:11 PM      Component Value Range Status Comment   Specimen Description URINE, CLEAN CATCH   Final    Special Requests NONE   Final    Culture  Setup Time [id number]   Final    Colony Count 1,000 COLONIES/ML   Final    Culture INSIGNIFICANT GROWTH   Final    Report Status 11/06/2011 FINAL   Final   CULTURE, EXPECTORATED SPUTUM-ASSESSMENT     Status: Normal   Collection Time   11/05/11 12:25 PM      Component Value Range Status Comment   Specimen Description SPUTUM   Final    Special Requests NONE   Final    Sputum evaluation     Final    Value: THIS SPECIMEN IS ACCEPTABLE. RESPIRATORY CULTURE REPORT TO FOLLOW.   Report Status 11/05/2011 FINAL   Final   CULTURE, RESPIRATORY     Status: Normal (Preliminary result)   Collection Time   11/05/11 12:25 PM      Component Value Range Status Comment   Specimen Description SPUTUM   Final    Special Requests NONE   Final    Gram Stain PENDING   Incomplete    Culture NORMAL OROPHARYNGEAL FLORA   Final    Report Status PENDING   Incomplete     Studies/Results: Dg Chest 2 View  11/04/2011  *RADIOLOGY REPORT*  Clinical Data: Chest pain cough.  CHEST - 2 VIEW  Comparison: 12/04/2003  Findings: Hilar fullness is similar to prior.  Cardiac contour within normal limits.  There is a mild interstitial prominence and peribronchial thickening.  No focal consolidation, pleural effusion, or  pneumothorax.  No acute osseous finding.  There is mild wedge deformity of a lower thoracic vertebral body, similar to prior.  IMPRESSION: Mild interstitial prominence / peribronchial thickening is nonspecific.  May reflect chronic bronchitic change (particularly if there is a smoking history) or seen with atypical infection.  No focal consolidation.  Original Report Authenticated By: Suanne Marker, M.D.   Abd 1 View (kub)  11/04/2011  *RADIOLOGY REPORT*  Clinical Data: Abdominal pain.  ABDOMEN - 1 VIEW  Comparison: None.  Findings: Bowel gas pattern unremarkable without evidence of obstruction or significant ileus.  Gas throughout normal caliber colon and gas within several nondistended loops of small bowel. Phlebolith in the right side of the pelvis.  No visible opaque urinary tract calculi.  Regional skeleton unremarkable.  IMPRESSION: No acute abdominal abnormality.  Original Report Authenticated By: Deniece Portela, M.D.   US Renal  11/04/2011  *RADIOLOGY REPORT*  Clinical Data: Elevated BUN and creatinine, hematuria  RENAL/URINARY TRACT ULTRASOUND COMPLETE  Comparison:  None.  Findings:  Right Kidney:  14.1 cm length.  Normal cortical thickness. Increased cortical echogenicity.  No mass, hydronephrosis or shadowing calcification.  No perinephric fluid.  Left Kidney:  15.0 cm length.  Normal cortical thickness. Increased cortical echogenicity.  No mass, hydronephrosis shadowing calcification.  No perinephric fluid.  Bladder:  Normal appearance  IMPRESSION: Mildly enlarged kidneys bilaterally demonstrating increased cortical echogenicity suggesting medical renal disease. No renal mass or evidence of urinary tract obstruction identified.  Original Report Authenticated By: Burnetta Sabin, M.D.    Medications: Scheduled Meds:    . darbepoetin (ARANESP) injection - NON-DIALYSIS  100 mcg Subcutaneous Q Sun-1800  . feeding supplement  1 Container Oral TID BM  . methylPREDNISolone (SOLU-MEDROL)  injection  500 mg Intravenous Q24H  . pantoprazole  40 mg Oral BID AC  . pneumococcal 23 valent vaccine  0.5 mL Intramuscular Tomorrow-1000  . sodium chloride  3 mL Intravenous Q12H  . sodium polystyrene  45 g Oral Once  . DISCONTD: antiseptic oral rinse  15 mL Mouth Rinse q12n4p  . DISCONTD: levofloxacin (LEVAQUIN) IV  500 mg Intravenous Q48H   Continuous Infusions:    . sodium chloride 50 mL/hr at 11/06/11 0233  .  sodium bicarbonate infusion 1000 mL 150 mL/hr at 11/06/11 1020   PRN Meds:.albuterol, ondansetron (ZOFRAN) IV, ondansetron  Assessment/Plan:   Acute renal failure/ Hematuria ? ATN from cocaine use- possibility of pulmonary renal syndrome as well, therefore he has been started on high dose steroids. Biopsy requested by renal - has been evaluated by IR today.    Hyperkalemia Due to renal failure- improved with kayexalate   Abdominal pain  Likely from cocaine abuse- resolved   Polysubstance abuse counseled   Abnormal weight loss Due to acute illness   Anemia/ Black stool?  He states today that he does not recall complaining of black stool and stool yesterday was brown.  Iron panel normal which may signify and acute bleed-  check hemoccult   Coagulopathy Elevated ptt- mixing study ordered but is a send out to [facility] -blood has been drawn- will be sent tomorrow   Hemoptysis Only one episode on the day before admission. None in the hospital   Shaking chills No infectious cause found as of yet He was placed on  Levaquin on admission-  d/c'd this on 6/22.    Color blindness   Tobacco  abuse   Code Status: full  Disposition:transfer out to renal floor  [facility] [phone] 11/06/2011, 12:28 PM  LOS: 2 days

## 2011-11-07 ENCOUNTER — Inpatient Hospital Stay (HOSPITAL_COMMUNITY): Payer: Medicaid Other

## 2011-11-07 DIAGNOSIS — F1910 Other psychoactive substance abuse, uncomplicated: Secondary | ICD-10-CM

## 2011-11-07 DIAGNOSIS — R042 Hemoptysis: Secondary | ICD-10-CM

## 2011-11-07 DIAGNOSIS — E875 Hyperkalemia: Secondary | ICD-10-CM

## 2011-11-07 DIAGNOSIS — N179 Acute kidney failure, unspecified: Secondary | ICD-10-CM

## 2011-11-07 HISTORY — PX: RENAL BIOPSY: SHX156

## 2011-11-07 LAB — URINE DRUGS OF ABUSE SCREEN W ALC, ROUTINE (REF LAB)
Amphetamine Screen, Ur: NEGATIVE
Barbiturate Quant, Ur: NEGATIVE
Benzodiazepines.: NEGATIVE
Ethyl Alcohol: <10 mg/dL (ref <10)
Marijuana Metabolite: NEGATIVE
Opiate Screen, Urine: NEGATIVE
Propoxyphene: NEGATIVE

## 2011-11-07 LAB — KAPPA/LAMBDA LIGHT CHAINS
Kappa, lambda light chain ratio: 2.15 — ABNORMAL HIGH (ref 0.26–1.65)
Lambda free light chains: 7.35 mg/dL — ABNORMAL HIGH (ref 0.57–2.63)

## 2011-11-07 LAB — CULTURE, RESPIRATORY W GRAM STAIN: Culture: NORMAL

## 2011-11-07 LAB — RENAL FUNCTION PANEL
Albumin: 2.6 g/dL — ABNORMAL LOW (ref 3.5–5.2)
BUN: 107 mg/dL — ABNORMAL HIGH (ref 6–23)
Calcium: 7 mg/dL — ABNORMAL LOW (ref 8.4–10.5)
Creatinine, Ser: 9.75 mg/dL — ABNORMAL HIGH (ref 0.50–1.35)
GFR calc non Af Amer: 6 mL/min — ABNORMAL LOW (ref >90)

## 2011-11-07 LAB — CBC
HCT: 22 % — ABNORMAL LOW (ref 39.0–52.0)
Hemoglobin: 7.8 g/dL — ABNORMAL LOW (ref 13.0–17.0)
MCH: 29.5 pg (ref 26.0–34.0)
MCHC: 35.5 g/dL (ref 30.0–36.0)

## 2011-11-07 LAB — ANTI-NUCLEAR AB-TITER (ANA TITER)

## 2011-11-07 MED ORDER — MIDAZOLAM HCL 5 MG/5ML IJ SOLN
INTRAMUSCULAR | Status: AC | PRN
  Administered 2011-11-07: 2 mg via INTRAVENOUS

## 2011-11-07 MED ORDER — SODIUM CHLORIDE 0.9 % IV SOLN
500.0000 mg | INTRAVENOUS | Status: AC
  Administered 2011-11-07: 500 mg via INTRAVENOUS
  Filled 2011-11-07: qty 4

## 2011-11-07 MED ORDER — SEVELAMER CARBONATE 800 MG PO TABS
1600.0000 mg | ORAL_TABLET | Freq: Three times a day (TID) | ORAL | Status: DC
  Administered 2011-11-07 – 2011-11-29 (×55): 1600 mg via ORAL
  Filled 2011-11-07 (×70): qty 2

## 2011-11-07 MED ORDER — MIDAZOLAM HCL 2 MG/2ML IJ SOLN
INTRAMUSCULAR | Status: AC
  Filled 2011-11-07: qty 4

## 2011-11-07 MED ORDER — FENTANYL CITRATE 0.05 MG/ML IJ SOLN
INTRAMUSCULAR | Status: AC
  Filled 2011-11-07: qty 4

## 2011-11-07 MED ORDER — FENTANYL CITRATE 0.05 MG/ML IJ SOLN
INTRAMUSCULAR | Status: AC | PRN
  Administered 2011-11-07 (×2): 25 ug via INTRAVENOUS

## 2011-11-07 MED ORDER — FERUMOXYTOL INJECTION 510 MG/17 ML
510.0000 mg | Freq: Once | INTRAVENOUS | Status: AC
  Administered 2011-11-07: 510 mg via INTRAVENOUS
  Filled 2011-11-07: qty 17

## 2011-11-07 MED ORDER — PREDNISONE 50 MG PO TABS
70.0000 mg | ORAL_TABLET | Freq: Every day | ORAL | Status: DC
  Administered 2011-11-08 – 2011-11-29 (×22): 70 mg via ORAL
  Filled 2011-11-07 (×24): qty 1

## 2011-11-07 NOTE — Procedures (Signed)
Technically successful US guided biopsy of the right kidney.  Sample amount of non-enlarging right sided perinephric hematoma was noted following the biopsy.  Patient remained pain free and hemodynamically stable during and immediately after the procedure.

## 2011-11-07 NOTE — Progress Notes (Addendum)
[facility]  Subjective:  Feels ok.  Just back from renal bx.  No hemoptysis since last week   Objective: Vital signs in last 24 hours: Blood pressure 119/64, pulse 87, temperature 98.3 F (36.8 C), temperature source Oral, resp. rate 23, height 6' (1.829 m), weight 76.5 kg (168 lb 10.4 oz), SpO2 100.00%.    PHYSICAL EXAM General--awake, alert, lying on back in bed Chest--clear Heart--no rub Abd--nontender, bx site clean Extr--no edema, no rash, no arthritis  Lab Results:   Lab 11/07/11 0708 11/06/11 0344 11/05/11 1724 11/05/11 1250  NA 142 138 136 --  K 3.9 4.3 4.3 --  CL 94* 98 98 --  CO2 30 23 19  --  BUN 107* 106* 105* --  CREATININE 9.75* 10.01* 10.06* --  ALB -- -- -- --  GLUCOSE 173* -- -- --  CALCIUM 7.0* 8.0* 8.6 --  PHOS 9.1* 7.4* -- 6.4*     Basename 11/07/11 0708 11/05/11 0430  WBC 7.5 5.1  HGB 7.8* 8.3*  HCT 22.0* 24.4*  PLT 178 169   Complement levels--normal ANA +, anti DNA antibody-pending Anti GBM antibody and ANCA pending  Assessment/Plan: 1. Renal- elevated creatinine of unknown etiology. His urinalysis shows hematuria and proteinuria  His renal ultrasound shows normal-sized kidneys with some increased cortical echogenicity with no hydro-. Renal bx done today will tell whether any reversible component, whether it can be treated, and what is prognosis . Will continue to hydrate him with fluids and monitor his kidney function, and ins and outs closely.  There are no indications for dialysis presently. He's received Solumedrol 500 mg IV for last 3 days.  Will begin po prednisone 70 mg/day starting tomorrow.  Begin Renvela 1600 tid with meals for huigh phosphorus 2. Hypertension/volume -  not hypertensive in the setting of renal failure. Continue with fluids as above.  3. Hyperkalemia- improved 4. Metabolic acidosis- due to renal failure. Bicarb now  30.  Will d/c bicarbonate drip.  5. Anemia -  On aranesp.  Fe/TIBC 24%, ferritin 286.   Will Rx IV Fe.  Also type and screen already done if he needs PRBC     LOS: 3 days   Eh Sesay F 11/07/2011,1:07 PM   .labalb

## 2011-11-07 NOTE — Progress Notes (Signed)
Patient came back from the procedure at the said time , AOx4, vitals stable and biopsy site checked was clean and intact. On bedrest for 4hrs. Will continue to monitor that.

## 2011-11-07 NOTE — ED Notes (Signed)
Sinus Loletha Grayer on telemetry

## 2011-11-07 NOTE — Progress Notes (Signed)
Subjective: No complaints Denies any chest pain any shortness of breath   Objective: Vital signs in last 24 hours: Filed Vitals:   11/06/11 1430 11/06/11 1755 11/06/11 2210 11/07/11 0642  BP: 129/64 118/64 125/72 114/60  Pulse: 73 73 68 89  Temp: 98.5 F (36.9 C) 98 F (36.7 C) 98.1 F (36.7 C) 98.3 F (36.8 C)  TempSrc: Oral Oral Oral Oral  Resp: 18 18 18 18   Height:      Weight:      SpO2: 99% 98% 97% 94%    Intake/Output Summary (Last 24 hours) at 11/07/11 1142 Last data filed at 11/07/11 0340  Gross per 24 hour  Intake   3400 ml  Output   1450 ml  Net   1950 ml    Weight change:   General appearance: alert, cooperative and no distress  Throat: lips, mucosa, and tongue normal; teeth and gums normal  Lungs: clear to auscultation bilaterally  Chest wall: no tenderness  Abdomen: soft, non-tender; bowel sounds normal; no masses, no organomegaly  Extremities: extremities normal, atraumatic, no cyanosis or edema  Skin: Skin color, texture, turgor normal. No rashes or lesions   Lab Results: Results for orders placed during the hospital encounter of 11/04/11 (from the past 24 hour(s))  APTT     Status: Abnormal   Collection Time   11/07/11  7:08 AM      Component Value Range   aPTT 44 (*) 24 - 37 seconds  CBC     Status: Abnormal   Collection Time   11/07/11  7:08 AM      Component Value Range   WBC 7.5  4.0 - 10.5 K/uL   RBC 2.64 (*) 4.22 - 5.81 MIL/uL   Hemoglobin 7.8 (*) 13.0 - 17.0 g/dL   HCT 22.0 (*) 39.0 - 52.0 %   MCV 83.3  78.0 - 100.0 fL   MCH 29.5  26.0 - 34.0 pg   MCHC 35.5  30.0 - 36.0 g/dL   RDW 12.6  11.5 - 15.5 %   Platelets 178  150 - 400 K/uL  RENAL FUNCTION PANEL     Status: Abnormal   Collection Time   11/07/11  7:08 AM      Component Value Range   Sodium 142  135 - 145 mEq/L   Potassium 3.9  3.5 - 5.1 mEq/L   Chloride 94 (*) 96 - 112 mEq/L   CO2 30  19 - 32 mEq/L   Glucose, Bld 173 (*) 70 - 99 mg/dL   BUN 107 (*) 6 - 23 mg/dL   Creatinine, Ser 9.75 (*) 0.50 - 1.35 mg/dL   Calcium 7.0 (*) 8.4 - 10.5 mg/dL   Phosphorus 9.1 (*) 2.3 - 4.6 mg/dL   Albumin 2.6 (*) 3.5 - 5.2 g/dL   GFR calc non Af Amer 6 (*) >90 mL/min   GFR calc Af Amer 7 (*) >90 mL/min     Micro: Recent Results (from the past 240 hour(s))  URINE CULTURE     Status: Normal   Collection Time   11/04/11  2:10 PM      Component Value Range Status Comment   Specimen Description URINE, CLEAN CATCH   Final    Special Requests NONE   Final    Culture  Setup Time [id number]   Final    Colony Count NO GROWTH   Final    Culture NO GROWTH   Final    Report Status 11/06/2011 FINAL  Final   CULTURE, BLOOD (ROUTINE X 2)     Status: Normal (Preliminary result)   Collection Time   11/04/11  5:20 PM      Component Value Range Status Comment   Specimen Description BLOOD HAND LEFT   Final    Special Requests BOTTLES DRAWN AEROBIC ONLY 3CC   Final    Culture  Setup Time [id number]   Final    Culture     Final    Value:        BLOOD CULTURE RECEIVED NO GROWTH TO DATE CULTURE WILL BE HELD FOR 5 DAYS BEFORE ISSUING A FINAL NEGATIVE REPORT   Report Status PENDING   Incomplete   CULTURE, BLOOD (ROUTINE X 2)     Status: Normal (Preliminary result)   Collection Time   11/04/11  5:31 PM      Component Value Range Status Comment   Specimen Description BLOOD ARM RIGHT   Final    Special Requests BOTTLES DRAWN AEROBIC AND ANAEROBIC 10CC   Final    Culture  Setup Time [id number]   Final    Culture     Final    Value:        BLOOD CULTURE RECEIVED NO GROWTH TO DATE CULTURE WILL BE HELD FOR 5 DAYS BEFORE ISSUING A FINAL NEGATIVE REPORT   Report Status PENDING   Incomplete   MRSA PCR SCREENING     Status: Normal   Collection Time   11/04/11  6:20 PM      Component Value Range Status Comment   MRSA by PCR NEGATIVE  NEGATIVE Final   URINE CULTURE     Status: Normal   Collection Time   11/04/11  8:11 PM      Component Value Range Status Comment   Specimen  Description URINE, CLEAN CATCH   Final    Special Requests NONE   Final    Culture  Setup Time [id number]   Final    Colony Count 1,000 COLONIES/ML   Final    Culture INSIGNIFICANT GROWTH   Final    Report Status 11/06/2011 FINAL   Final   CULTURE, EXPECTORATED SPUTUM-ASSESSMENT     Status: Normal   Collection Time   11/05/11 12:25 PM      Component Value Range Status Comment   Specimen Description SPUTUM   Final    Special Requests NONE   Final    Sputum evaluation     Final    Value: THIS SPECIMEN IS ACCEPTABLE. RESPIRATORY CULTURE REPORT TO FOLLOW.   Report Status 11/05/2011 FINAL   Final   CULTURE, RESPIRATORY     Status: Normal   Collection Time   11/05/11 12:25 PM      Component Value Range Status Comment   Specimen Description SPUTUM   Final    Special Requests NONE   Final    Gram Stain     Final    Value: RARE WBC PRESENT, PREDOMINANTLY PMN     FEW SQUAMOUS EPITHELIAL CELLS PRESENT     ABUNDANT GRAM POSITIVE COCCI IN PAIRS     IN CLUSTERS MODERATE GRAM POSITIVE RODS     FEW GRAM NEGATIVE RODS   Culture NORMAL OROPHARYNGEAL FLORA   Final    Report Status 11/07/2011 FINAL   Final     Studies/Results: No results found.  Medications:  Scheduled Meds:   . darbepoetin (ARANESP) injection - NON-DIALYSIS  100 mcg Subcutaneous Q Sun-1800  . feeding supplement  1 Container  Oral TID BM  . fentaNYL      . methylPREDNISolone (SOLU-MEDROL) injection  500 mg Intravenous Q24H  . midazolam      . pantoprazole  40 mg Oral BID AC  . pneumococcal 23 valent vaccine  0.5 mL Intramuscular Tomorrow-1000  . sodium chloride  3 mL Intravenous Q12H  . sodium polystyrene  45 g Oral Once   Continuous Infusions:   . sodium chloride 50 mL/hr at 11/06/11 2314  .  sodium bicarbonate infusion 1000 mL 150 mL/hr at 11/07/11 0724   PRN Meds:.albuterol, ondansetron (ZOFRAN) IV, ondansetron   Assessment: Principal Problem:  *Acute renal failure Active Problems:  Hyperkalemia  Abdominal  pain of multiple sites  Polysubstance abuse  Tobacco abuse  Abnormal weight loss  Anemia  Hemoptysis  Black stools  Hematuria  Dehydration  Shaking chills  Color blindness  Coagulopathy   Plan: #1 acute renal failure/hematuria nephrology following, ATN secondary to cocaine use/pulmonary renal syndrome, awaiting renal biopsy by interventional radiology, continue IV Solu-Medrol  #2 hyperkalemia improved  #3 polysubstance abuse/cocaine abuse counseled  #4 anemia hemoglobin is 7.8 today down from 8.3 yesterday, denies any obvious melena hematochezia Continue to check stool guaiac, will transfuse if hemoglobin less than 7 actively bleeding, secondary to hematuria?  #5 coagulopathy mixing study ordered, monitor results, send out to [facility]   #6 hemoptysis just one episode now resolved  [facility] 11/07/2011, 11:42 AM

## 2011-11-07 NOTE — Progress Notes (Signed)
Inpatient Diabetes Program Recommendations  AACE/ADA: New Consensus Statement on Inpatient Glycemic Control  Target Ranges:  Prepandial:   less than 140 mg/dL      Peak postprandial:   less than 180 mg/dL (1-2 hours)      Critically ill patients:  140 - 180 mg/dL  Pager:  [phone] Hours:  8 am-10pm   Reason for Visit: Elevated glucose: 213 mg/dL  Inpatient Diabetes Program Recommendations Correction (SSI): Consider adding Novolog Correction while on high dose steroids    Courtney Heys PhD, RN Diabetes Coordinator  Office:  [phone] Team Pager:  [phone]

## 2011-11-07 NOTE — Care Management Note (Signed)
    Page 1 of 1   11/07/2011     3:32:12 PM   CARE MANAGEMENT NOTE 11/07/2011  Patient:  Jeremy Mccoy, Jeremy Mccoy   Account Number:  [id number]  Date Initiated:  11/07/2011  Documentation initiated by:  Lars Pinks  Subjective/Objective Assessment:   PT WAS ADMITTED WITH N/V     Action/Plan:   PROGRESSION OF CARE AND DISCHARGE PLANNING   Anticipated DC Date:  11/11/2011   Anticipated DC Plan:  HOME/SELF CARE  In-house referral  Clinical Social Worker      DC Planning Services  CM consult      Choice offered to / List presented to:             Status of service:  In process, will continue to follow Medicare Important Message given?   (If response is "NO", the following Medicare IM given date fields will be blank) Date Medicare IM given:   Date Additional Medicare IM given:    Discharge Disposition:    Per UR Regulation:  Reviewed for med. necessity/level of care/duration of stay  If discussed at [place] of Stay Meetings, dates discussed:    Comments:  11/07/11 Lars Pinks, RN, BSN 87 PT WAS ADMITTED WITH N/V AND IS LOOKING TO HAVING ? HD ONE DAY, PT WAS HAVING A RENAL BIOPSY TODAY.   PTA PT WAS LIVING WITH HIS PARENTS AND IS EMPLOYED.  WILL F/U ON HIS DC NEEDS.  CSW INFORMED OF HIM NOT HAVING ANY INSURANCE AND MAY NEED HD IN THE FUTURE.  FINANCIAL COORDINATOR HAS ALREADY REACHED PT IN HIS RM TO TALK ABOUT WAYS TO PAY HIS BILL.

## 2011-11-07 NOTE — Progress Notes (Signed)
Spoke with Dr. Hassell Done regarding HGB of 7.8 and he ordered a Type and Screen and said that the Renal biopsy could be done.  T&S was done on 6/21 and results are valid for 3 days

## 2011-11-08 DIAGNOSIS — F1910 Other psychoactive substance abuse, uncomplicated: Secondary | ICD-10-CM

## 2011-11-08 DIAGNOSIS — R042 Hemoptysis: Secondary | ICD-10-CM

## 2011-11-08 DIAGNOSIS — N179 Acute kidney failure, unspecified: Secondary | ICD-10-CM

## 2011-11-08 DIAGNOSIS — E875 Hyperkalemia: Secondary | ICD-10-CM

## 2011-11-08 LAB — CBC
HCT: 22.1 % — ABNORMAL LOW (ref 39.0–52.0)
MCV: 85.7 fL (ref 78.0–100.0)
Platelets: 176 10*3/uL (ref 150–400)
RBC: 2.58 MIL/uL — ABNORMAL LOW (ref 4.22–5.81)
RDW: 12.9 % (ref 11.5–15.5)
WBC: 6.8 10*3/uL (ref 4.0–10.5)

## 2011-11-08 LAB — RENAL FUNCTION PANEL
Albumin: 2.5 g/dL — ABNORMAL LOW (ref 3.5–5.2)
CO2: 38 mEq/L — ABNORMAL HIGH (ref 19–32)
Chloride: 89 mEq/L — ABNORMAL LOW (ref 96–112)
Creatinine, Ser: 9.55 mg/dL — ABNORMAL HIGH (ref 0.50–1.35)
GFR calc non Af Amer: 6 mL/min — ABNORMAL LOW (ref >90)
Potassium: 3.5 mEq/L (ref 3.5–5.1)

## 2011-11-08 LAB — PROTEIN ELECTROPHORESIS, SERUM
Albumin ELP: 49.3 % — ABNORMAL LOW (ref 55.8–66.1)
Alpha-2-Globulin: 14.6 % — ABNORMAL HIGH (ref 7.1–11.8)
Beta Globulin: 4.4 % — ABNORMAL LOW (ref 4.7–7.2)
Total Protein ELP: 6.6 g/dL (ref 6.0–8.3)

## 2011-11-08 LAB — PTT FACTOR INHIBITOR (MIXING STUDY)
1 Hr Incub PT 1:1NP: 37 seconds
PTT: 48 seconds — ABNORMAL HIGH (ref <30)

## 2011-11-08 LAB — MPO/PR-3 (ANCA) ANTIBODIES: Serine Protease 3: 684 AU/mL — ABNORMAL HIGH (ref <20)

## 2011-11-08 LAB — GLUCOSE, CAPILLARY: Glucose-Capillary: 145 mg/dL — ABNORMAL HIGH (ref 70–99)

## 2011-11-08 LAB — ANTI-DNA ANTIBODY, DOUBLE-STRANDED: ds DNA Ab: 37 IU/mL — ABNORMAL HIGH (ref <30)

## 2011-11-08 MED ORDER — INSULIN ASPART 100 UNIT/ML ~~LOC~~ SOLN
0.0000 [IU] | Freq: Three times a day (TID) | SUBCUTANEOUS | Status: DC
  Administered 2011-11-08 (×2): 2 [IU] via SUBCUTANEOUS
  Administered 2011-11-09: 3 [IU] via SUBCUTANEOUS
  Administered 2011-11-09 – 2011-11-10 (×2): 2 [IU] via SUBCUTANEOUS
  Administered 2011-11-10: 3 [IU] via SUBCUTANEOUS
  Administered 2011-11-11: 2 [IU] via SUBCUTANEOUS
  Administered 2011-11-12 – 2011-11-15 (×2): 3 [IU] via SUBCUTANEOUS
  Administered 2011-11-16: 2 [IU] via SUBCUTANEOUS
  Administered 2011-11-17: 3 [IU] via SUBCUTANEOUS
  Administered 2011-11-18 – 2011-11-21 (×4): 2 [IU] via SUBCUTANEOUS
  Administered 2011-11-22 – 2011-11-25 (×3): 3 [IU] via SUBCUTANEOUS
  Administered 2011-11-26 – 2011-11-28 (×3): 2 [IU] via SUBCUTANEOUS

## 2011-11-08 NOTE — Clinical Social Work Psychosocial (Signed)
     Clinical Social Work Department BRIEF PSYCHOSOCIAL ASSESSMENT 11/08/2011  Patient:  Jeremy Mccoy, Jeremy Mccoy     Account Number:  [id number]     Admit date:  11/04/2011  Clinical Social Worker:  Lehman Prom  Date/Time:  11/08/2011 03:38 PM  Referred by:  Physician  Date Referred:  11/08/2011 Referred for  Substance Abuse   Other Referral:   Interview type:  Patient Other interview type:    PSYCHOSOCIAL DATA Living Status:  ALONE Admitted from facility:   Level of care:   Primary support name:  Pat Mode Primary support relationship to patient:  PARENT Degree of support available:   adequate. Pt lives on parents property in a house seperate from his parents' home.    CURRENT CONCERNS Current Concerns  Substance Abuse   Other Concerns:    SOCIAL WORK ASSESSMENT / PLAN CSW met with pt to address consult. CSW introduced herself and explained role of social work. CSW also completed SBIRT. Pt shared that he uses crack cocaine almost daily for the past 20 years. Pt denies the use of any other drugs, including ETOH. Pt reported the he works and his drug use has never interfered with his work. Pt also reported that he has isolated himself from everyone in order to use alone. Pt shared that he is ready to make a change as his health is a major concern. Pt shared that he has been through many treatment programs and is not interested in leaving his job. Pt reported that he is also interested in being less isolated and the possibility of going to church groups for support. Pt reported that he has several resources and has kept the all of the materials from his treatment. CSW provided pt with supportive counseling as well as information on the addiction process. CSW will continue to follow during this admission for resources and further support.   Assessment/plan status:  Referral to [employer] Other assessment/ plan:   Information/referral to community resources:   [facility] the [place] for outpatient substance abuse treatment.    PATIENTS/FAMILYS RESPONSE TO PLAN OF CARE: Pt was alert and oriented x4. Pt is agreeable to discharge plan to return home when medically ready. Pt was appreciative of intervention from [place] and is accepting of resources.

## 2011-11-08 NOTE — Progress Notes (Signed)
[facility]  Subjective:  Feels fine, not hungry Still getting IV bicarb Off solumedrol, on po prednisone No gross hematuria following renal bx   Objective: Vital signs in last 24 hours: Blood pressure 108/66, pulse 74, temperature 97.8 F (36.6 C), temperature source Oral, resp. rate 16, height 6' (1.829 m), weight 83.6 kg (184 lb 4.9 oz), SpO2 92.00%.    PHYSICAL EXAM General--awake, alert Chest--clear Heart--no rub Abd--bx site clean Extr--no edema  Lab Results:   Lab 11/08/11 0555 11/07/11 0708 11/06/11 0344  NA 141 142 138  K 3.5 3.9 4.3  CL 89* 94* 98  CO2 38* 30 23  BUN 105* 107* 106*  CREATININE 9.55* 9.75* 10.01*  ALB -- -- --  GLUCOSE 175* -- --  CALCIUM 6.7* 7.0* 8.0*  PHOS 9.0* 9.1* 7.4*     Basename 11/08/11 0555 11/07/11 0708  WBC 6.8 7.5  HGB 7.7* 7.8*  HCT 22.1* 22.0*  PLT 176 178    Weight   71.4 kg on 21 Jun--83.6 kg today(?)  I/O yesterday--2400/1350  Assessment/Plan: 1. Renal- renal bx done yesterday--just received at nephropath lab at [facility] today.  IF will be ready later today--light microscopy late tomorrow.  Remains on prednisone.  D/C IVF.  Save L arm for vascular access (R handed).  Cr down slightly (may just reflect vol expansion) 2. Hypertension/volume - not hypertensive in the setting of renal failure. D/c fluids as above.  3. Hyperkalemia-resolved K 3.5 today 4. Metabolic acidosis- due to renal failure. Bicarb now 38. d/c bicarbonate drip.  5. Anemia - On aranesp. Fe/TIBC 24%, ferritin 286. Hgb today 7.7.  Will Rx IV Fe. Also type and screen already done if he needs PRBC    LOS: 4 days   Erva Koke F 11/08/2011,11:03 AM   .labalb

## 2011-11-08 NOTE — Progress Notes (Addendum)
Subjective: No complaints Hemoptysis Denies pain from his biopsy   Objective: Vital signs in last 24 hours: Filed Vitals:   11/07/11 1400 11/07/11 1415 11/07/11 2248 11/08/11 0535  BP: 111/56 118/70 113/68 108/66  Pulse: 70 69 77 74  Temp: 97.9 F (36.6 C) 98 F (36.7 C) 98.3 F (36.8 C) 97.8 F (36.6 C)  TempSrc: Oral Oral Oral Oral  Resp: 18 18  16   Height:      Weight:    83.6 kg (184 lb 4.9 oz)  SpO2: 96% 100% 95% 92%    Intake/Output Summary (Last 24 hours) at 11/08/11 0901 Last data filed at 11/08/11 0804  Gross per 24 hour  Intake   3000 ml  Output    950 ml  Net   2050 ml    Weight change:   General--awake, alert, lying on back in bed  Chest--clear  Heart--no rub  Abd--nontender, bx site clean  Extr--no edema, no rash, no arthritis   Lab Results: Results for orders placed during the hospital encounter of 11/04/11 (from the past 24 hour(s))  OCCULT BLOOD X 1 CARD TO LAB, STOOL     Status: Normal   Collection Time   11/07/11  9:36 PM      Component Value Range   Fecal Occult Bld NEGATIVE    CBC     Status: Abnormal   Collection Time   11/08/11  5:55 AM      Component Value Range   WBC 6.8  4.0 - 10.5 K/uL   RBC 2.58 (*) 4.22 - 5.81 MIL/uL   Hemoglobin 7.7 (*) 13.0 - 17.0 g/dL   HCT 22.1 (*) 39.0 - 52.0 %   MCV 85.7  78.0 - 100.0 fL   MCH 29.8  26.0 - 34.0 pg   MCHC 34.8  30.0 - 36.0 g/dL   RDW 12.9  11.5 - 15.5 %   Platelets 176  150 - 400 K/uL  RENAL FUNCTION PANEL     Status: Abnormal   Collection Time   11/08/11  5:55 AM      Component Value Range   Sodium 141  135 - 145 mEq/L   Potassium 3.5  3.5 - 5.1 mEq/L   Chloride 89 (*) 96 - 112 mEq/L   CO2 38 (*) 19 - 32 mEq/L   Glucose, Bld 175 (*) 70 - 99 mg/dL   BUN 105 (*) 6 - 23 mg/dL   Creatinine, Ser 9.55 (*) 0.50 - 1.35 mg/dL   Calcium 6.7 (*) 8.4 - 10.5 mg/dL   Phosphorus 9.0 (*) 2.3 - 4.6 mg/dL   Albumin 2.5 (*) 3.5 - 5.2 g/dL   GFR calc non Af Amer 6 (*) >90 mL/min   GFR calc Af Amer  7 (*) >90 mL/min     Micro: Recent Results (from the past 240 hour(s))  URINE CULTURE     Status: Normal   Collection Time   11/04/11  2:10 PM      Component Value Range Status Comment   Specimen Description URINE, CLEAN CATCH   Final    Special Requests NONE   Final    Culture  Setup Time [id number]   Final    Colony Count NO GROWTH   Final    Culture NO GROWTH   Final    Report Status 11/06/2011 FINAL   Final   CULTURE, BLOOD (ROUTINE X 2)     Status: Normal (Preliminary result)   Collection Time   11/04/11  5:20  PM      Component Value Range Status Comment   Specimen Description BLOOD HAND LEFT   Final    Special Requests BOTTLES DRAWN AEROBIC ONLY 3CC   Final    Culture  Setup Time [id number]   Final    Culture     Final    Value:        BLOOD CULTURE RECEIVED NO GROWTH TO DATE CULTURE WILL BE HELD FOR 5 DAYS BEFORE ISSUING A FINAL NEGATIVE REPORT   Report Status PENDING   Incomplete   CULTURE, BLOOD (ROUTINE X 2)     Status: Normal (Preliminary result)   Collection Time   11/04/11  5:31 PM      Component Value Range Status Comment   Specimen Description BLOOD ARM RIGHT   Final    Special Requests BOTTLES DRAWN AEROBIC AND ANAEROBIC 10CC   Final    Culture  Setup Time [id number]   Final    Culture     Final    Value:        BLOOD CULTURE RECEIVED NO GROWTH TO DATE CULTURE WILL BE HELD FOR 5 DAYS BEFORE ISSUING A FINAL NEGATIVE REPORT   Report Status PENDING   Incomplete   MRSA PCR SCREENING     Status: Normal   Collection Time   11/04/11  6:20 PM      Component Value Range Status Comment   MRSA by PCR NEGATIVE  NEGATIVE Final   URINE CULTURE     Status: Normal   Collection Time   11/04/11  8:11 PM      Component Value Range Status Comment   Specimen Description URINE, CLEAN CATCH   Final    Special Requests NONE   Final    Culture  Setup Time [id number]   Final    Colony Count 1,000 COLONIES/ML   Final    Culture INSIGNIFICANT GROWTH   Final    Report Status  11/06/2011 FINAL   Final   CULTURE, EXPECTORATED SPUTUM-ASSESSMENT     Status: Normal   Collection Time   11/05/11 12:25 PM      Component Value Range Status Comment   Specimen Description SPUTUM   Final    Special Requests NONE   Final    Sputum evaluation     Final    Value: THIS SPECIMEN IS ACCEPTABLE. RESPIRATORY CULTURE REPORT TO FOLLOW.   Report Status 11/05/2011 FINAL   Final   CULTURE, RESPIRATORY     Status: Normal   Collection Time   11/05/11 12:25 PM      Component Value Range Status Comment   Specimen Description SPUTUM   Final    Special Requests NONE   Final    Gram Stain     Final    Value: RARE WBC PRESENT, PREDOMINANTLY PMN     FEW SQUAMOUS EPITHELIAL CELLS PRESENT     ABUNDANT GRAM POSITIVE COCCI IN PAIRS     IN CLUSTERS MODERATE GRAM POSITIVE RODS     FEW GRAM NEGATIVE RODS   Culture NORMAL OROPHARYNGEAL FLORA   Final    Report Status 11/07/2011 FINAL   Final     Studies/Results: US Biopsy  11/07/2011  *RADIOLOGY REPORT*  Indication: Acute renal insufficiency of indeterminate etiology  ULTRASOUND GUIDED RENAL BIOPSY  Comparison: Renal ultrasound - 11/04/2011  Medications: Fentanyl 50 mcg IV; Versed 2 mg IV  Total Moderate Sedation time: 14 minutes  Complications: Development of a small, non enlarging small perinephric hematoma.  Patient denied flank pain and remaining hemodynamically stable.  Procedure:  Informed written consent was obtained from the patient after a discussion of the risks, benefits and alternatives to treatment. The patient understands and consents to the procedure.  A timeout was performed prior to the initiation of the procedure.  Ultrasound scanning was performed of the bilateral flanks, demonstrated interval development of a small amount of perinephric fluid bilaterally.  The inferior pole of the right kidney was selected for biopsy due to location and sonographic window.  The procedure was planned.  The operative site was prepped and draped in the  usual sterile fashion.  The overlying soft tissues were anesthetized with 1% lidocaine with epinephrine.  A 17 gauge core needle biopsy device was advanced into the inferior cortex of the right kidney and 2 core biopsies were obtained under direct ultrasound guidance.  Images were saved for documentation purposes.  The biopsy device was removed and hemostasis was obtained with manual compression.  Post procedural scanning demonstrated a minimal amount of perinephric hemorrhage.  Delayed sonographic images obtained approximately 10 minutes after the procedure was negative for interval growth in the amount perinephric hemorrhage.  Additionally, the patient denied flank pain and remained hemodynamically stable.  A dressing was placed. The patient tolerated the procedure well.  Impression:  1.  Technically successful ultrasound guided right renal biopsy.  2.  Procedure complicated by development of a small, nonenlarged right-sided perinephric hematoma.  Original Report Authenticated By: Rachel Moulds, M.D.    Medications:  Scheduled Meds:   . darbepoetin (ARANESP) injection - NON-DIALYSIS  100 mcg Subcutaneous Q Sun-1800  . feeding supplement  1 Container Oral TID BM  . fentaNYL      . ferumoxytol  510 mg Intravenous Once  . insulin aspart  0-15 Units Subcutaneous TID WC  . methylPREDNISolone (SOLU-MEDROL) injection  500 mg Intravenous Q24H  . midazolam      . pantoprazole  40 mg Oral BID AC  . pneumococcal 23 valent vaccine  0.5 mL Intramuscular Tomorrow-1000  . predniSONE  70 mg Oral Q breakfast  . sevelamer  1,600 mg Oral TID WC  . sodium chloride  3 mL Intravenous Q12H  . sodium polystyrene  45 g Oral Once  . DISCONTD: methylPREDNISolone (SOLU-MEDROL) injection  500 mg Intravenous Q24H   Continuous Infusions:   . sodium chloride 50 mL/hr at 11/07/11 1719  .  sodium bicarbonate infusion 1000 mL 150 mL/hr at 11/08/11 0655   PRN Meds:.albuterol, fentaNYL, midazolam, ondansetron (ZOFRAN) IV,  ondansetron   Assessment: Principal Problem:  *Acute renal failure Active Problems:  Hyperkalemia  Abdominal pain of multiple sites  Polysubstance abuse  Tobacco abuse  Abnormal weight loss  Anemia  Hemoptysis  Black stools  Hematuria  Dehydration  Shaking chills  Color blindness  Coagulopathy   Plan: #1 acute renal failure workup pending per nephrology Discontinue Solu-Medrol and switched to prednisone 70 mg per day Awaiting results of the biopsy  #2 hypertension in the setting of renal failure currently stable #3 hyperkalemia improved #4 metabolic acidosis continue to follow bicarbonate status post IV sodium bicarbonate #5 anemia hemoglobin trending down however not at the transfusion trigger plus the patient is asymptomatic. Continue to monitor and transfuse if hemoglobin less than 7. Folate and vitamin B 12 are normal. Patient has received IV iron during this hospitalization #6 disposition per nephrology #7 polysubstance abuse/cocaine abuse counseled   LOS: 4 days   [facility] 11/08/2011, 9:01 AM

## 2011-11-09 DIAGNOSIS — R042 Hemoptysis: Secondary | ICD-10-CM

## 2011-11-09 DIAGNOSIS — N179 Acute kidney failure, unspecified: Secondary | ICD-10-CM

## 2011-11-09 DIAGNOSIS — E875 Hyperkalemia: Secondary | ICD-10-CM

## 2011-11-09 DIAGNOSIS — F1910 Other psychoactive substance abuse, uncomplicated: Secondary | ICD-10-CM

## 2011-11-09 LAB — GLUCOSE, CAPILLARY
Glucose-Capillary: 87 mg/dL (ref 70–99)
Glucose-Capillary: 159 mg/dL — ABNORMAL HIGH (ref 70–99)

## 2011-11-09 LAB — RENAL FUNCTION PANEL
Albumin: 2.5 g/dL — ABNORMAL LOW (ref 3.5–5.2)
CO2: 35 mEq/L — ABNORMAL HIGH (ref 19–32)
Chloride: 91 mEq/L — ABNORMAL LOW (ref 96–112)
Creatinine, Ser: 9.3 mg/dL — ABNORMAL HIGH (ref 0.50–1.35)
GFR calc Af Amer: 7 mL/min — ABNORMAL LOW (ref >90)
GFR calc non Af Amer: 6 mL/min — ABNORMAL LOW (ref >90)
Potassium: 3.3 mEq/L — ABNORMAL LOW (ref 3.5–5.1)
Sodium: 142 mEq/L (ref 135–145)

## 2011-11-09 LAB — CBC
MCV: 86.9 fL (ref 78.0–100.0)
Platelets: 148 10*3/uL — ABNORMAL LOW (ref 150–400)
RBC: 2.6 MIL/uL — ABNORMAL LOW (ref 4.22–5.81)
RDW: 12.8 % (ref 11.5–15.5)
WBC: 8 10*3/uL (ref 4.0–10.5)

## 2011-11-09 NOTE — Progress Notes (Signed)
Received pt. As a transfer from 3000, pt. Is from home with his family, ambulates independently, no skin sores.pt. Receives education about renal diet,pt. Got hand out about renal diet and meals.also pt. Got the welcome book and the instructions how to order videos related with his renal failure diagnose.keep monitoring pt. And assessing his needs.

## 2011-11-09 NOTE — Progress Notes (Signed)
Patient ID: Jeremy Mccoy  male  [id number]    DOB: 07-30-1965    DOA: 11/04/2011  PCP: Pcp Not In System  Subjective: No complaints per patient, dressed up watching TV   Objective: Weight change:   Intake/Output Summary (Last 24 hours) at 11/09/11 1103 Last data filed at 11/09/11 0806  Gross per 24 hour  Intake    840 ml  Output    775 ml  Net     65 ml   Blood pressure 112/64, pulse 82, temperature 98.7 F (37.1 C), temperature source Oral, resp. rate 18, height 6' (1.829 m), weight 82.419 kg (181 lb 11.2 oz), SpO2 92.00%.  Physical Exam: General: Alert and awake, oriented x3, not in any acute distress. HEENT: anicteric sclera, pupils reactive to light and accommodation, EOMI CVS: S1-S2 clear, no murmur rubs or gallops Chest: clear to auscultation bilaterally, no wheezing, rales or rhonchi Abdomen: soft nontender, nondistended, normal bowel sounds, no organomegaly Extremities: no cyanosis, clubbing or edema noted bilaterally Neuro: Cranial nerves II-XII intact, no focal neurological deficits  Lab Results: Basic Metabolic Panel:  Lab [id number] 0645 11/08/11 0555 11/04/11 1658  NA 142 141 --  K 3.3* 3.5 --  CL 91* 89* --  CO2 35* 38* --  GLUCOSE 90 175* --  BUN 108* 105* --  CREATININE 9.30* 9.55* --  CALCIUM 7.4* 6.7* --  MG -- -- 2.2  PHOS 7.8* -- --   Liver Function Tests:  Lab 11/09/11 0645 11/08/11 0555 11/05/11 0430 11/04/11 1658  AST -- -- 6 6  ALT -- -- <5 <5  ALKPHOS -- -- 58 65  BILITOT -- -- 0.2* 0.1*  PROT -- -- 6.8 7.6  ALBUMIN 2.5* 2.5* -- --    Lab 11/04/11 1405  LIPASE 33  AMYLASE --   No results found for this basename: AMMONIA:2 in the last 168 hours CBC:  Lab 11/09/11 0645 11/08/11 0555 11/04/11 1405  WBC 8.0 6.8 --  NEUTROABS -- -- 3.5  HGB 7.7* 7.7* --  HCT 22.6* 22.1* --  MCV 86.9 85.7 --  PLT 148* 176 --   Cardiac Enzymes:  Lab 11/05/11 0936 11/04/11 2357 11/04/11 1658  CKTOTAL 47 54 66  CKMB 1.7 1.7 2.0  CKMBINDEX  -- -- --  TROPONINI <0.30 <0.30 <0.30   BNP: No components found with this basename: POCBNP:2 CBG:  Lab 11/09/11 0649 11/08/11 2235 11/08/11 1641 11/08/11 1213  GLUCAP 87 131* 137* 145*     Micro Results: Recent Results (from the past 240 hour(s))  URINE CULTURE     Status: Normal   Collection Time   11/04/11  2:10 PM      Component Value Range Status Comment   Specimen Description URINE, CLEAN CATCH   Final    Special Requests NONE   Final    Culture  Setup Time [id number]   Final    Colony Count NO GROWTH   Final    Culture NO GROWTH   Final    Report Status 11/06/2011 FINAL   Final   CULTURE, BLOOD (ROUTINE X 2)     Status: Normal (Preliminary result)   Collection Time   11/04/11  5:20 PM      Component Value Range Status Comment   Specimen Description BLOOD HAND LEFT   Final    Special Requests BOTTLES DRAWN AEROBIC ONLY [facility]   Final    Culture  Setup Time [id number]   Final    Culture  Final    Value:        BLOOD CULTURE RECEIVED NO GROWTH TO DATE CULTURE WILL BE HELD FOR 5 DAYS BEFORE ISSUING A FINAL NEGATIVE REPORT   Report Status PENDING   Incomplete   CULTURE, BLOOD (ROUTINE X 2)     Status: Normal (Preliminary result)   Collection Time   11/04/11  5:31 PM      Component Value Range Status Comment   Specimen Description BLOOD ARM RIGHT   Final    Special Requests BOTTLES DRAWN AEROBIC AND ANAEROBIC 10CC   Final    Culture  Setup Time [id number]   Final    Culture     Final    Value:        BLOOD CULTURE RECEIVED NO GROWTH TO DATE CULTURE WILL BE HELD FOR 5 DAYS BEFORE ISSUING A FINAL NEGATIVE REPORT   Report Status PENDING   Incomplete   MRSA PCR SCREENING     Status: Normal   Collection Time   11/04/11  6:20 PM      Component Value Range Status Comment   MRSA by PCR NEGATIVE  NEGATIVE Final   URINE CULTURE     Status: Normal   Collection Time   11/04/11  8:11 PM      Component Value Range Status Comment   Specimen Description URINE, CLEAN CATCH    Final    Special Requests NONE   Final    Culture  Setup Time [id number]   Final    Colony Count 1,000 COLONIES/ML   Final    Culture INSIGNIFICANT GROWTH   Final    Report Status 11/06/2011 FINAL   Final   CULTURE, EXPECTORATED SPUTUM-ASSESSMENT     Status: Normal   Collection Time   11/05/11 12:25 PM      Component Value Range Status Comment   Specimen Description SPUTUM   Final    Special Requests NONE   Final    Sputum evaluation     Final    Value: THIS SPECIMEN IS ACCEPTABLE. RESPIRATORY CULTURE REPORT TO FOLLOW.   Report Status 11/05/2011 FINAL   Final   CULTURE, RESPIRATORY     Status: Normal   Collection Time   11/05/11 12:25 PM      Component Value Range Status Comment   Specimen Description SPUTUM   Final    Special Requests NONE   Final    Gram Stain     Final    Value: RARE WBC PRESENT, PREDOMINANTLY PMN     FEW SQUAMOUS EPITHELIAL CELLS PRESENT     ABUNDANT GRAM POSITIVE COCCI IN PAIRS     IN CLUSTERS MODERATE GRAM POSITIVE RODS     FEW GRAM NEGATIVE RODS   Culture NORMAL OROPHARYNGEAL FLORA   Final    Report Status 11/07/2011 FINAL   Final     Studies/Results: Dg Chest 2 View  11/04/2011  *RADIOLOGY REPORT*  Clinical Data: Chest pain cough.  CHEST - 2 VIEW  Comparison: 12/04/2003  Findings: Hilar fullness is similar to prior.  Cardiac contour within normal limits.  There is a mild interstitial prominence and peribronchial thickening.  No focal consolidation, pleural effusion, or pneumothorax.  No acute osseous finding.  There is mild wedge deformity of a lower thoracic vertebral body, similar to prior.  IMPRESSION: Mild interstitial prominence / peribronchial thickening is nonspecific.  May reflect chronic bronchitic change (particularly if there is a smoking history) or seen with atypical infection.  No focal consolidation.  Original Report Authenticated By: Suanne Marker, M.D.   Abd 1 View (kub)  11/04/2011  *RADIOLOGY REPORT*  Clinical Data: Abdominal pain.   ABDOMEN - 1 VIEW  Comparison: None.  Findings: Bowel gas pattern unremarkable without evidence of obstruction or significant ileus.  Gas throughout normal caliber colon and gas within several nondistended loops of small bowel. Phlebolith in the right side of the pelvis.  No visible opaque urinary tract calculi.  Regional skeleton unremarkable.  IMPRESSION: No acute abdominal abnormality.  Original Report Authenticated By: Deniece Portela, M.D.   US Renal  11/04/2011  *RADIOLOGY REPORT*  Clinical Data: Elevated BUN and creatinine, hematuria  RENAL/URINARY TRACT ULTRASOUND COMPLETE  Comparison:  None.  Findings:  Right Kidney:  14.1 cm length.  Normal cortical thickness. Increased cortical echogenicity.  No mass, hydronephrosis or shadowing calcification.  No perinephric fluid.  Left Kidney:  15.0 cm length.  Normal cortical thickness. Increased cortical echogenicity.  No mass, hydronephrosis shadowing calcification.  No perinephric fluid.  Bladder:  Normal appearance  IMPRESSION: Mildly enlarged kidneys bilaterally demonstrating increased cortical echogenicity suggesting medical renal disease. No renal mass or evidence of urinary tract obstruction identified.  Original Report Authenticated By: Burnetta Sabin, M.D.   US Biopsy  11/07/2011  *RADIOLOGY REPORT*  Indication: Acute renal insufficiency of indeterminate etiology  ULTRASOUND GUIDED RENAL BIOPSY  Comparison: Renal ultrasound - 11/04/2011  Medications: Fentanyl 50 mcg IV; Versed 2 mg IV  Total Moderate Sedation time: 14 minutes  Complications: Development of a small, non enlarging small perinephric hematoma.  Patient denied flank pain and remaining hemodynamically stable.  Procedure:  Informed written consent was obtained from the patient after a discussion of the risks, benefits and alternatives to treatment. The patient understands and consents to the procedure.  A timeout was performed prior to the initiation of the procedure.  Ultrasound scanning  was performed of the bilateral flanks, demonstrated interval development of a small amount of perinephric fluid bilaterally.  The inferior pole of the right kidney was selected for biopsy due to location and sonographic window.  The procedure was planned.  The operative site was prepped and draped in the usual sterile fashion.  The overlying soft tissues were anesthetized with 1% lidocaine with epinephrine.  A 17 gauge core needle biopsy device was advanced into the inferior cortex of the right kidney and 2 core biopsies were obtained under direct ultrasound guidance.  Images were saved for documentation purposes.  The biopsy device was removed and hemostasis was obtained with manual compression.  Post procedural scanning demonstrated a minimal amount of perinephric hemorrhage.  Delayed sonographic images obtained approximately 10 minutes after the procedure was negative for interval growth in the amount perinephric hemorrhage.  Additionally, the patient denied flank pain and remained hemodynamically stable.  A dressing was placed. The patient tolerated the procedure well.  Impression:  1.  Technically successful ultrasound guided right renal biopsy.  2.  Procedure complicated by development of a small, nonenlarged right-sided perinephric hematoma.  Original Report Authenticated By: Rachel Moulds, M.D.    Medications: Scheduled Meds:   . darbepoetin (ARANESP) injection - NON-DIALYSIS  100 mcg Subcutaneous Q Sun-1800  . feeding supplement  1 Container Oral TID BM  . insulin aspart  0-15 Units Subcutaneous TID WC  . pantoprazole  40 mg Oral BID AC  . predniSONE  70 mg Oral Q breakfast  . sevelamer  1,600 mg Oral TID WC  . sodium chloride  3 mL Intravenous Q12H  .  sodium polystyrene  45 g Oral Once   Continuous Infusions:   . DISCONTD: sodium chloride 50 mL/hr at 11/07/11 1719  . DISCONTD:  sodium bicarbonate infusion 1000 mL 150 mL/hr at 11/08/11 [id number]     Assessment/Plan: Principal  Problem:   *Acute renal failure of unclear etiology: Only slight improvement in the creatinine - Status post renal biopsy, results pending - Currently on prednisone, discussed in detail with Dr. Hassell Done from renal service, probably will start on Cytoxan today    Active Problems:  HTN: in the setting of renal failure, currently stable    hyperkalemia improved, now K 3.3    metabolic acidosis: Resolved - Off bicarbonate drip   anemia: -  H&H still low but stable, transfuse if hemoglobin less than 7, asymptomatic. Continue ARANESP  -  Folate and vitamin B 12 are normal. Patient has received IV iron during this hospitalization   polysubstance abuse/cocaine abuse counseled, social work note appreciated  DVT Prophylaxis: SCDs  Disposition: Discussed in detail with Dr. Salem Senate, Nephrology Service, will transfer patient to [address] nephrology floor. Nephrology will assume care, TR[facility] will sign off.    LOS: 5 days   Jaydrian Corpening M.D. [facility] 11/09/2011, 11:03 AM Pager: [phone]  If 7PM-7AM, please contact night-coverage [email or web address] Password [email or web address]

## 2011-11-09 NOTE — Progress Notes (Signed)
Patient transferred to [address] via wheelchair in no signs of acute distress. VSS.

## 2011-11-09 NOTE — Progress Notes (Addendum)
[facility]  Subjective:  Awake, alert, wants to go home.  He says he smokes crack at home "as often as I can get it." No complaints of gross hematuria following renal bx   Objective: Vital signs in last 24 hours: Blood pressure 112/64, pulse 82, temperature 98.7 F (37.1 C), temperature source Oral, resp. rate 18, height 6' (1.829 m), weight 82.419 kg (181 lb 11.2 oz), SpO2 92.00%.    PHYSICAL EXAM General--no distress, wonders why he has to stay in hospital Chest--clear Heart--no rub Abd--nontender Extr--no edema, no rash  Lab Results:   Lab 11/09/11 0645 11/08/11 0555 11/07/11 0708  NA 142 141 142  K 3.3* 3.5 3.9  CL 91* 89* 94*  CO2 35* 38* 30  BUN 108* 105* 107*  CREATININE 9.30* 9.55* 9.75*  ALB -- -- --  GLUCOSE 90 -- --  CALCIUM 7.4* 6.7* 7.0*  PHOS 7.8* 9.0* 9.1*     Basename 11/09/11 0645 11/08/11 0555  WBC 8.0 6.8  HGB 7.7* 7.7*  HCT 22.6* 22.1*  PLT 148* 176    Assessment/Plan: 1. 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  Light microscopy due out later today.. Remains on prednisone. Save L arm for vascular access (R handed).Vein mapping to be done today.  Cr down slightly (may just reflect vol expansion).  If no viable glomeruli seen on bx, then my just begin dialysis--not needed at his point today  2. Hypertension/volume - not hypertensive currently 3. Hyperkalemia-resolved K 3.3 today.  Give po K  4. Metabolic acidosis- due to renal failure. Bicarb now 35. off bicarbonate drip.  5. Anemia - On aranesp. Fe/TIBC 24%, ferritin 286. Hgb today 7.7. Getting IV Fe. Also type and screen already done if he needs PRB 6.  Renal osteodystrophy--PTH 48, Ca 7.4, phos 7.8 (on renvela 2 tid)  I spoke to him about risks vs benefits of high dose steroids (solumedrol and prednisone) and cytoxan.  He understands. Check CBC and renal profile tomorrow.  Vein mapping today.  Discussed with Dr. Tana Coast.   Will transfer to Renal Service.   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   LOS: 5 days   Jeremy Mccoy F 11/09/2011,11:13 AM   .labalb

## 2011-11-10 ENCOUNTER — Inpatient Hospital Stay (HOSPITAL_COMMUNITY): Payer: Medicaid Other

## 2011-11-10 ENCOUNTER — Encounter (HOSPITAL_COMMUNITY): Payer: Self-pay | Admitting: Radiology

## 2011-11-10 LAB — CBC WITH DIFFERENTIAL/PLATELET
Basophils Relative: 0 % (ref 0–1)
Eosinophils Absolute: 0 10*3/uL (ref 0.0–0.7)
Lymphocytes Relative: 23 % (ref 12–46)
Lymphs Abs: 1.9 10*3/uL (ref 0.7–4.0)
MCV: 86.5 fL (ref 78.0–100.0)
Monocytes Absolute: 0.4 10*3/uL (ref 0.1–1.0)
Monocytes Relative: 5 % (ref 3–12)
Neutro Abs: 5.9 10*3/uL (ref 1.7–7.7)
Neutrophils Relative %: 72 % (ref 43–77)
Platelets: 171 10*3/uL (ref 150–400)
RBC: 2.52 MIL/uL — ABNORMAL LOW (ref 4.22–5.81)
RDW: 12.6 % (ref 11.5–15.5)
WBC: 8.2 10*3/uL (ref 4.0–10.5)

## 2011-11-10 LAB — COMPREHENSIVE METABOLIC PANEL
BUN: 109 mg/dL — ABNORMAL HIGH (ref 6–23)
CO2: 33 mEq/L — ABNORMAL HIGH (ref 19–32)
Calcium: 7.8 mg/dL — ABNORMAL LOW (ref 8.4–10.5)
Chloride: 94 mEq/L — ABNORMAL LOW (ref 96–112)
Creatinine, Ser: 8.82 mg/dL — ABNORMAL HIGH (ref 0.50–1.35)
GFR calc non Af Amer: 6 mL/min — ABNORMAL LOW (ref >90)
Total Bilirubin: 0.2 mg/dL — ABNORMAL LOW (ref 0.3–1.2)

## 2011-11-10 LAB — CBC
HCT: 22.5 % — ABNORMAL LOW (ref 39.0–52.0)
MCH: 30 pg (ref 26.0–34.0)
MCV: 86.5 fL (ref 78.0–100.0)
RDW: 12.6 % (ref 11.5–15.5)
WBC: 7.9 10*3/uL (ref 4.0–10.5)

## 2011-11-10 LAB — SEDIMENTATION RATE: Sed Rate: 68 mm/hr — ABNORMAL HIGH (ref 0–16)

## 2011-11-10 LAB — GLUCOSE, CAPILLARY: Glucose-Capillary: 195 mg/dL — ABNORMAL HIGH (ref 70–99)

## 2011-11-10 LAB — PHOSPHORUS: Phosphorus: 6.9 mg/dL — ABNORMAL HIGH (ref 2.3–4.6)

## 2011-11-10 LAB — C-REACTIVE PROTEIN: CRP: 1.22 mg/dL — ABNORMAL HIGH (ref <0.60)

## 2011-11-10 MED ORDER — ACETAMINOPHEN 325 MG PO TABS: 650.0000 mg | ORAL_TABLET | ORAL | Status: DC | PRN

## 2011-11-10 MED ORDER — CALCIUM GLUCONATE 10 % IV SOLN: 2.0000 g | INTRAVENOUS | Status: DC | PRN

## 2011-11-10 MED ORDER — SODIUM CHLORIDE 0.9 % IV SOLN
250.0000 mg | Freq: Once | INTRAVENOUS | Status: DC
  Filled 2011-11-10: qty 2.5

## 2011-11-10 MED ORDER — POTASSIUM CHLORIDE CRYS ER 20 MEQ PO TBCR
40.0000 meq | EXTENDED_RELEASE_TABLET | Freq: Two times a day (BID) | ORAL | Status: AC
  Administered 2011-11-10 – 2011-11-11 (×3): 40 meq via ORAL
  Filled 2011-11-10 (×3): qty 2

## 2011-11-10 MED ORDER — CALCIUM CARBONATE ANTACID 500 MG PO CHEW: 2.0000 | CHEWABLE_TABLET | ORAL | Status: DC | PRN

## 2011-11-10 MED ORDER — MIDAZOLAM HCL 5 MG/5ML IJ SOLN
INTRAMUSCULAR | Status: AC | PRN
  Administered 2011-11-10 (×2): 1 mg via INTRAVENOUS

## 2011-11-10 MED ORDER — SODIUM CHLORIDE 0.9 % IV SOLN
250.0000 mg | INTRAVENOUS | Status: DC
  Administered 2011-11-10: 250 mg via INTRAVENOUS
  Filled 2011-11-10 (×3): qty 2.5

## 2011-11-10 MED ORDER — SODIUM CHLORIDE 0.9 % IV SOLN: 250.0000 mg | Freq: Once | INTRAVENOUS | Status: DC

## 2011-11-10 MED ORDER — FENTANYL CITRATE 0.05 MG/ML IJ SOLN
INTRAMUSCULAR | Status: AC
  Filled 2011-11-10: qty 4

## 2011-11-10 MED ORDER — HEPARIN SODIUM (PORCINE) 1000 UNIT/ML IJ SOLN
1000.0000 [IU] | Freq: Once | INTRAMUSCULAR | Status: DC
  Filled 2011-11-10: qty 1

## 2011-11-10 MED ORDER — DEXTROSE-NACL 5-0.45 % IV SOLN
INTRAVENOUS | Status: AC
  Administered 2011-11-10: 13:00:00 via INTRAVENOUS

## 2011-11-10 MED ORDER — ACD FORMULA A 0.73-2.45-2.2 GM/100ML VI SOLN
500.0000 mL | Status: DC
  Administered 2011-11-13: 500 mL via INTRAVENOUS
  Filled 2011-11-10: qty 500

## 2011-11-10 MED ORDER — SODIUM CHLORIDE 0.9 % IV SOLN: 2.0000 g | INTRAVENOUS | Status: DC | PRN

## 2011-11-10 MED ORDER — SODIUM CHLORIDE 0.9 % IV SOLN
250.0000 mg | INTRAVENOUS | Status: AC
  Administered 2011-11-10 – 2011-11-11 (×3): 250 mg via INTRAVENOUS
  Filled 2011-11-10: qty 2.5

## 2011-11-10 MED ORDER — CEFAZOLIN SODIUM 1-5 GM-% IV SOLN
INTRAVENOUS | Status: AC
  Filled 2011-11-10: qty 50

## 2011-11-10 MED ORDER — CALCIUM GLUCONATE 10 % IV SOLN: Freq: Once | INTRAVENOUS | Status: DC

## 2011-11-10 MED ORDER — MIDAZOLAM HCL 2 MG/2ML IJ SOLN
INTRAMUSCULAR | Status: AC
  Filled 2011-11-10: qty 4

## 2011-11-10 MED ORDER — SODIUM CHLORIDE 0.9 % IV SOLN
2.0000 g | INTRAVENOUS | Status: DC | PRN
  Administered 2011-11-11: 2 g via INTRAVENOUS
  Filled 2011-11-10 (×2): qty 20

## 2011-11-10 MED ORDER — FENTANYL CITRATE 0.05 MG/ML IJ SOLN
INTRAMUSCULAR | Status: AC | PRN
  Administered 2011-11-10 (×2): 50 ug via INTRAVENOUS

## 2011-11-10 MED ORDER — ACD FORMULA A 0.73-2.45-2.2 GM/100ML VI SOLN: 500.0000 mL | Status: DC

## 2011-11-10 MED ORDER — SODIUM CHLORIDE 0.9 % IV SOLN
500.0000 mg/m2 | Freq: Once | INTRAVENOUS | Status: AC
  Administered 2011-11-10: 1020 mg via INTRAVENOUS
  Filled 2011-11-10: qty 51

## 2011-11-10 MED ORDER — DIPHENHYDRAMINE HCL 25 MG PO CAPS: 25.0000 mg | ORAL_CAPSULE | Freq: Four times a day (QID) | ORAL | Status: DC | PRN

## 2011-11-10 MED ORDER — NEPRO/CARBSTEADY PO LIQD
237.0000 mL | ORAL | Status: DC
  Administered 2011-11-10 – 2011-11-15 (×5): 237 mL via ORAL

## 2011-11-10 MED ORDER — HEPARIN SODIUM (PORCINE) 1000 UNIT/ML IJ SOLN
INTRAMUSCULAR | Status: AC
  Filled 2011-11-10: qty 1

## 2011-11-10 MED ORDER — CEFAZOLIN SODIUM 1-5 GM-% IV SOLN
1.0000 g | Freq: Once | INTRAVENOUS | Status: AC
  Administered 2011-11-10: 1 g via INTRAVENOUS
  Filled 2011-11-10: qty 50

## 2011-11-10 MED ORDER — HEPARIN SODIUM (PORCINE) 1000 UNIT/ML IJ SOLN: 1000.0000 [IU] | Freq: Once | INTRAMUSCULAR | Status: DC

## 2011-11-10 MED ORDER — SODIUM CHLORIDE 0.9 % IV SOLN: 4.0000 g | INTRAVENOUS | Status: DC | PRN

## 2011-11-10 NOTE — Progress Notes (Signed)
Arrived at 1740 to d/c Cytoxan and dispose of properly. Noted that Cytoxan was still clamped and pt had not yet received the Cytoxan. I opened clamp and chemo is now infusing Staff RN notified

## 2011-11-10 NOTE — Progress Notes (Signed)
[facility]  Subjective:  Awake, apprehensive and worried about his condition.  I discussed treatment options, side effects, and prognosis. He says' do whatever it takes."   Objective: Vital signs in last 24 hours: Blood pressure 114/78, pulse 88, temperature 98.5 F (36.9 C), temperature source Oral, resp. rate 16, height 6' (1.829 m), weight 82.691 kg (182 lb 4.8 oz), SpO2 98.00%.    PHYSICAL EXAM General--alert, no dyspnea, no hemoptysis, no orthopnea Chest--clear Heart--no rub Abd--nontender Extr--no edema, no rash  Lab Results:   Lab 11/10/11 0600 11/09/11 0645 11/08/11 0555  NA 141 142 141  K 3.2* 3.3* 3.5  CL 94* 91* 89*  CO2 33* 35* 38*  BUN 109* 108* 105*  CREATININE 8.82* 9.30* 9.55*  ALB -- -- --  GLUCOSE 107* -- --  CALCIUM 7.8* 7.4* 6.7*  PHOS 6.9* 7.8* 9.0*     Assessment/Plan:  1. 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  Appreciate Dr. Tonette Bihari consult.  I also spoke with Dr. Leslee Home (expert in glomerular diseases at [facility]).  He recommends IV cytoxan monthly, plasmapheresis, steroids +/- dialysis. He thinks rituxan may not work as well with Cr as high as it is and suggests cytoxan/steroids/plasma exchange.  Cytoxan will be 0.5 g/m2 monthly (he recommends not going above 0.5 g/m2 as long as GFR is as low as it is).   He also says if after 3 months renal function hasn't improved, then to d/c immunosuppressive therapy.  Plasma exchange will be QOD for 2 wk.  Dr. Ouida Sills recommends CT chest Will need IR to place tunneled cath today to begin plasma exchange tomorrow.  His BSA is 1.98 m2   Dr. Ouida Sills has ordered CT chest, Hep B serology, and TB screening (quantaferon). 2. Hypertension/volume - not hypertensive currently  3. Hyperkalemia-resolved K 3.2 today. Give po K    4. Metabolic acidosis- due to renal failure. Bicarb now 35. off bicarbonate drip.  5. Anemia - On aranesp. Fe/TIBC 24%, ferritin 286. Hgb today 7.8. Getting IV Fe. Also type and screen already done if he needs PRB  6. Renal osteodystrophy--PTH 48, Ca 7.4, phos 6.9 (on renvela 2 tid)-wlll check phos end of week and increase renvela if phos still> 5.5   LOS: 6 days   Leonora Gores F 11/10/2011,7:52 AM   .labalb

## 2011-11-10 NOTE — Progress Notes (Signed)
Nutrition Follow-up  Intervention:   1. Discontinue Resource Breeze PO TID, pt is "tired of it" 2. Add Nepro PO daily for additional kcal, 8 oz Nepro Shake provides 425 kcal and 19 grams protein 3. RD to continue to follow nutrition care plan  Assessment: Radiology consulted for renal bx. Completed bx on 6/24. Pt transferred to Renal Service and 6700. Per nursing note, pt educated regarding renal diet.  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 Per nephrology, plan is to begin immunosuppressive therapy. Pt will need plasmopheresis, to begin tomorrow. Pt has yet to receive/require HD.  Pt states his appetite is superb, eating 100% of meals, despite documentation of variable intake (25-75%).  Diet Order:  Renal 60 - 70 Supplements: Resource Breeze PO TID between meals  Meds: Scheduled Meds:   . cyclophosphamide  500 mg/m2 Intravenous Once  . darbepoetin (ARANESP) injection - NON-DIALYSIS  100 mcg Subcutaneous Q Sun-1800  . feeding supplement  1 Container Oral TID BM  . heparin  1,000 Units Intracatheter Once  . heparin  1,000 Units Intracatheter Once  . insulin aspart  0-15 Units Subcutaneous TID WC  . mesna  250 mg Intravenous Once  . mesna  250 mg Intravenous Once  . mesna  250 mg Intravenous Q4H  . pantoprazole  40 mg Oral BID AC  . potassium chloride  40 mEq Oral BID  . predniSONE  70 mg Oral Q breakfast  . sevelamer  1,600 mg Oral TID WC  . sodium polystyrene  45 g Oral Once  . DISCONTD: therapeutic plasma exchange solution   Dialysis Once in dialysis  . DISCONTD: sodium chloride  3 mL Intravenous Q12H   Continuous Infusions:   . citrate dextrose    . citrate dextrose    . dextrose 5 % and 0.45% NaCl     PRN Meds:.acetaminophen, acetaminophen, albuterol, calcium carbonate, calcium gluconate IVPB, calcium gluconate IVPB, diphenhydrAMINE, diphenhydrAMINE, ondansetron (ZOFRAN) IV, ondansetron, DISCONTD: calcium gluconate IVPB,  DISCONTD: calcium gluconate  Labs:  CMP     Component Value Date/Time   NA 141 11/10/2011 0600   K 3.2* 11/10/2011 0600   CL 94* 11/10/2011 0600   CO2 33* 11/10/2011 0600   GLUCOSE 107* 11/10/2011 0600   BUN 109* 11/10/2011 0600   CREATININE 8.82* 11/10/2011 0600   CALCIUM 7.8* 11/10/2011 0600   PROT 5.5* 11/10/2011 0600   ALBUMIN 2.5* 11/10/2011 0600   AST 13 11/10/2011 0600   ALT 19 11/10/2011 0600   ALKPHOS 44 11/10/2011 0600   BILITOT 0.2* 11/10/2011 0600   GFRNONAA 6* 11/10/2011 0600   GFRAA 7* 11/10/2011 0600   Phosphorus  Date/Time Value Range Status  11/10/2011  6:00 AM 6.9* 2.3 - 4.6 mg/dL Final  11/09/2011  6:45 AM 7.8* 2.3 - 4.6 mg/dL Final  11/08/2011  5:55 AM 9.0* 2.3 - 4.6 mg/dL Final   No intake or output data in the 24 hours ending 11/10/11 1033  Weight Status:  82.7 kg/182 lb - wt trending up from wt of 157 lb on admission  Body mass index is 24.72 kg/(m^2). WNL  Re-estimated needs:  2400 - 2600 kcal, 90 - 110 grams protein  Nutrition Dx:  Unintended wt loss r/t drug use, poor appetite AEB 3% wt loss PTA. Ongoing.  Goal:  Pt to meet >/= 90% of their estimated nutrition needs  Monitor:  PO intake, weights, renal function  Inda Coke MS, RD, LDN Pager#: [phone]

## 2011-11-10 NOTE — Progress Notes (Signed)
   CARE MANAGEMENT NOTE 11/10/2011  Patient:  Jeremy Mccoy, Jeremy Mccoy   Account Number:  [id number]  Date Initiated:  11/07/2011  Documentation initiated by:  Lars Pinks  Subjective/Objective Assessment:   PT WAS ADMITTED WITH N/V     Action/Plan:   PROGRESSION OF CARE AND DISCHARGE PLANNING   Anticipated DC Date:  11/11/2011   Anticipated DC Plan:  HOME/SELF CARE  In-house referral  Clinical Social Worker      DC Planning Services  CM consult      Choice offered to / List presented to:             Status of service:  In process, will continue to follow Medicare Important Message given?   (If response is "NO", the following Medicare IM given date fields will be blank) Date Medicare IM given:   Date Additional Medicare IM given:    Discharge Disposition:    Per UR Regulation:  Reviewed for med. necessity/level of care/duration of stay  If discussed at [place] of Stay Meetings, dates discussed:    Comments:  11/10/2011  Jeremy Mccoy, Jeremy Mccoy Patient not in room for CM follow up. Call to [employer] left message for return call regarding status.  11/07/11 Lars Pinks, RN, BSN 1528 PT WAS ADMITTED WITH N/V AND IS LOOKING TO HAVING ? HD ONE DAY, PT WAS HAVING A RENAL BIOPSY TODAY.   PTA PT WAS LIVING WITH HIS PARENTS AND IS EMPLOYED.  WILL F/U ON HIS DC NEEDS.  CSW INFORMED OF HIM NOT HAVING ANY INSURANCE AND MAY NEED HD IN THE FUTURE.  FINANCIAL COORDINATOR HAS ALREADY REACHED PT IN HIS RM TO TALK ABOUT WAYS TO PAY HIS BILL.

## 2011-11-10 NOTE — Consult Note (Signed)
Reason for Consult: vasculitis Referring Physician: Salem Senate, nephrology  Jeremy Mccoy is an 46 y.o. male.  HPI: Patient presented with hemoptysis, chest pain, and acute (presumably acute) renal failure a few days ago. He first developed lest sided chest pain and flank pain and had a sense of pressured urination. Not longer after, while freebasing cocaine, he developed hemoptysis but at no point did he have dyspnea. He eventually arrive at this ER and was noted to have a SCr of 10.  He denies having a fever or rash. No swelling or pain in his joints. He insists that he has otherwise been fine though he admits to a 7 pound weight loss over the past week.  Past Medical History  Diagnosis Date  . COPD (chronic obstructive pulmonary disease)   . Smoker   . Crack cocaine use   . Dental caries     History reviewed. No pertinent past surgical history.  Family History  Problem Relation Age of Onset  . Heart attack Father     Social History:  reports that he has been smoking Cigarettes.  He has a 25 pack-year smoking history. He has never used smokeless tobacco. He reports that he uses illicit drugs (Cocaine and Marijuana). He reports that he does not drink alcohol.  Allergies: No Known Allergies  Medications: I have reviewed the patient's current medications.  Results for orders placed during the hospital encounter of 11/04/11 (from the past 48 hour(s))  GLUCOSE, CAPILLARY     Status: Abnormal   Collection Time   11/08/11 12:13 PM      Component Value Range Comment   Glucose-Capillary 145 (*) 70 - 99 mg/dL    Comment 1 Documented in Chart      Comment 2 Notify RN     GLUCOSE, CAPILLARY     Status: Abnormal   Collection Time   11/08/11  4:41 PM      Component Value Range Comment   Glucose-Capillary 137 (*) 70 - 99 mg/dL    Comment 1 Documented in Chart      Comment 2 Notify RN     GLUCOSE, CAPILLARY     Status: Abnormal   Collection Time   11/08/11 10:35 PM      Component  Value Range Comment   Glucose-Capillary 131 (*) 70 - 99 mg/dL   CBC     Status: Abnormal   Collection Time   11/09/11  6:45 AM      Component Value Range Comment   WBC 8.0  4.0 - 10.5 K/uL    RBC 2.60 (*) 4.22 - 5.81 MIL/uL    Hemoglobin 7.7 (*) 13.0 - 17.0 g/dL    HCT 22.6 (*) 39.0 - 52.0 %    MCV 86.9  78.0 - 100.0 fL    MCH 29.6  26.0 - 34.0 pg    MCHC 34.1  30.0 - 36.0 g/dL    RDW 12.8  11.5 - 15.5 %    Platelets 148 (*) 150 - 400 K/uL   RENAL FUNCTION PANEL     Status: Abnormal   Collection Time   11/09/11  6:45 AM      Component Value Range Comment   Sodium 142  135 - 145 mEq/L    Potassium 3.3 (*) 3.5 - 5.1 mEq/L    Chloride 91 (*) 96 - 112 mEq/L    CO2 35 (*) 19 - 32 mEq/L    Glucose, Bld 90  70 - 99 mg/dL  BUN 108 (*) 6 - 23 mg/dL    Creatinine, Ser 9.30 (*) 0.50 - 1.35 mg/dL    Calcium 7.4 (*) 8.4 - 10.5 mg/dL    Phosphorus 7.8 (*) 2.3 - 4.6 mg/dL    Albumin 2.5 (*) 3.5 - 5.2 g/dL    GFR calc non Af Amer 6 (*) >90 mL/min    GFR calc Af Amer 7 (*) >90 mL/min   GLUCOSE, CAPILLARY     Status: Normal   Collection Time   11/09/11  6:49 AM      Component Value Range Comment   Glucose-Capillary 87  70 - 99 mg/dL   GLUCOSE, CAPILLARY     Status: Abnormal   Collection Time   11/09/11 11:49 AM      Component Value Range Comment   Glucose-Capillary 133 (*) 70 - 99 mg/dL    Comment 1 Documented in Chart      Comment 2 Notify RN     GLUCOSE, CAPILLARY     Status: Abnormal   Collection Time   11/09/11  1:24 PM      Component Value Range Comment   Glucose-Capillary 159 (*) 70 - 99 mg/dL    Comment 1 Notify RN      Comment 2 Documented in Chart     GLUCOSE, CAPILLARY     Status: Abnormal   Collection Time   11/09/11  5:01 PM      Component Value Range Comment   Glucose-Capillary 144 (*) 70 - 99 mg/dL   GLUCOSE, CAPILLARY     Status: Abnormal   Collection Time   11/09/11 10:27 PM      Component Value Range Comment   Glucose-Capillary 120 (*) 70 - 99 mg/dL   CBC      Status: Abnormal   Collection Time   11/10/11  6:00 AM      Component Value Range Comment   WBC 7.9  4.0 - 10.5 K/uL    RBC 2.60 (*) 4.22 - 5.81 MIL/uL    Hemoglobin 7.8 (*) 13.0 - 17.0 g/dL    HCT 22.5 (*) 39.0 - 52.0 %    MCV 86.5  78.0 - 100.0 fL    MCH 30.0  26.0 - 34.0 pg    MCHC 34.7  30.0 - 36.0 g/dL    RDW 12.6  11.5 - 15.5 %    Platelets 154  150 - 400 K/uL     No results found.  Review of Systems  Constitutional: Positive for fever, chills and weight loss.  HENT: Negative for hearing loss and ear pain.   Eyes: Negative for blurred vision, double vision, pain, discharge and redness.  Respiratory: Positive for hemoptysis. Negative for cough, shortness of breath, wheezing and stridor.   Cardiovascular: Positive for chest pain. Negative for palpitations and orthopnea.  Gastrointestinal: Positive for abdominal pain and diarrhea. Negative for nausea, vomiting and blood in stool.  Genitourinary: Positive for urgency. Negative for dysuria and hematuria.  Musculoskeletal: Negative for myalgias and joint pain.  Skin: Negative for itching and rash.  Neurological: Negative for dizziness, tingling, tremors and headaches.  Endo/Heme/Allergies: Positive for polydipsia. Does not bruise/bleed easily.  Psychiatric/Behavioral: Positive for substance abuse. Negative for depression and hallucinations.   Blood pressure 114/78, pulse 88, temperature 98.5 F (36.9 C), temperature source Oral, resp. rate 16, height 6' (1.829 m), weight 82.691 kg (182 lb 4.8 oz), SpO2 98.00%. Physical Exam  Constitutional: He is oriented to person, place, and time. He appears well-developed and  well-nourished.  HENT:  Head: Normocephalic.  Right Ear: External ear normal.  Left Ear: External ear normal.  Mouth/Throat: Oropharynx is clear and moist.  Eyes: Conjunctivae and EOM are normal.  Neck: Normal range of motion. Neck supple.  Cardiovascular: Intact distal pulses.        good capillary refill.    Respiratory: Effort normal and breath sounds normal.       No clubbing or cyanosis  Musculoskeletal: Normal range of motion.       No evidence of synovitis or in the mcps, wrists, elbows, or ankles.  Neurological: He is alert and oriented to person, place, and time.  Skin: Skin is warm and dry.  Psychiatric: He has a normal mood and affect. His behavior is normal. Judgment and thought content normal.    Assessment/Plan: 46 y/o cocaine user with hemoptysis and renal failure of unknown duration.  Renal biopsy shows multiple crescents and patient's CrCl has not responded to hydration and steroids.  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   Patient needs a chest CT to fully characterize his pulmonary involvement. Infiltrates were seen on chest xray on arrival.  Since his renal function is not improving on steroids, he needs Rituxan, however he needs to be screened for latent TB and hepatitis B before the first dose can be given.  Tobie Lords 11/10/2011, 7:13 AM

## 2011-11-10 NOTE — H&P (Signed)
History and Physical Interval Note:  11/10/2011 10:44 AM  Jeremy Mccoy  Has been admitted and we previously saw him for renal biopsy due to acute renal failure. He has now been diagnosed with glomerulonephritis/vascilitis and will require plasma exchanges as part of his treatment. Request for tunneled central catheter is made. The various methods of treatment have been discussed with the patient and family. After consideration of risks, benefits and other options for treatment, the patient has consented to insertion of tunneled plasmapheresis catheter.  The patient's history has been reviewed, patient examined, no change in status, stable for surgery.  I have reviewed the patients' chart and labs.  Questions were answered to the patient's satisfaction.   BP 129/75  Pulse 88  Temp 97.5 F (36.4 C) (Oral)  Resp 16  Ht 6' (1.829 m)  Wt 182 lb 4.8 oz (82.691 kg)  BMI 24.72 kg/m2  SpO2 98% Lungs: CTA Heart: Reg Chest wall: no deformities or evidence of prior procedures. Ext: FROM, normal distal pulses (B)  CBC    Component Value Date/Time   WBC 8.2 11/10/2011 0909   RBC 2.52* 11/10/2011 0909   HGB 7.5* 11/10/2011 0909   HCT 21.8* 11/10/2011 0909   PLT 171 11/10/2011 0909   MCV 86.5 11/10/2011 0909   MCH 29.8 11/10/2011 0909   MCHC 34.4 11/10/2011 0909   RDW 12.6 11/10/2011 0909   LYMPHSABS 1.9 11/10/2011 0909   MONOABS 0.4 11/10/2011 0909   EOSABS 0.0 11/10/2011 0909   BASOSABS 0.0 11/10/2011 0909    BMET    Component Value Date/Time   NA 141 11/10/2011 0600   K 3.2* 11/10/2011 0600   CL 94* 11/10/2011 0600   CO2 33* 11/10/2011 0600   GLUCOSE 107* 11/10/2011 0600   BUN 109* 11/10/2011 0600   CREATININE 8.82* 11/10/2011 0600   CALCIUM 7.8* 11/10/2011 0600   GFRNONAA 6* 11/10/2011 0600   GFRAA 7* 11/10/2011 0600    Prothrombin Time/INR 15.1/1.1 from 6/22   See for Dr. Nickola Major, Lennette Bihari PA-C

## 2011-11-10 NOTE — ED Notes (Signed)
O2 2L/[place] started prior to case

## 2011-11-10 NOTE — Progress Notes (Signed)
Pt to receive IV Cytoxan today;  Basic teaching done and pt signed consent form;  Attempting new iv start for chemo administration, but limited to right arm only;  Pt also scheduled to have tunneled catheter placed today per RN;  Pt appears anxious, has no questions; says "I just want to get it over with."   Raynelle Fanning RN IV Team

## 2011-11-10 NOTE — ED Notes (Signed)
O2 d/c'd 

## 2011-11-11 DIAGNOSIS — N19 Unspecified kidney failure: Secondary | ICD-10-CM

## 2011-11-11 LAB — COMPREHENSIVE METABOLIC PANEL
ALT: 21 U/L (ref 0–53)
AST: 14 U/L (ref 0–37)
CO2: 25 mEq/L (ref 19–32)
Calcium: 8.1 mg/dL — ABNORMAL LOW (ref 8.4–10.5)
Chloride: 95 mEq/L — ABNORMAL LOW (ref 96–112)
GFR calc non Af Amer: 7 mL/min — ABNORMAL LOW (ref >90)
Sodium: 137 mEq/L (ref 135–145)
Total Bilirubin: 0.2 mg/dL — ABNORMAL LOW (ref 0.3–1.2)

## 2011-11-11 LAB — HEPATITIS PANEL, ACUTE
HCV Ab: NEGATIVE
Hep A IgM: NEGATIVE
Hep B C IgM: NEGATIVE

## 2011-11-11 LAB — GLUCOSE, CAPILLARY

## 2011-11-11 LAB — CULTURE, BLOOD (ROUTINE X 2): Culture  Setup Time: 201306220135

## 2011-11-11 MED ORDER — SULFAMETHOXAZOLE-TRIMETHOPRIM 400-80 MG PO TABS
1.0000 | ORAL_TABLET | ORAL | Status: DC
  Administered 2011-11-11 – 2011-11-28 (×8): 1 via ORAL
  Filled 2011-11-11 (×11): qty 1

## 2011-11-11 MED ORDER — PANTOPRAZOLE SODIUM 40 MG PO TBEC
40.0000 mg | DELAYED_RELEASE_TABLET | Freq: Every day | ORAL | Status: DC
  Administered 2011-11-12 – 2011-11-29 (×18): 40 mg via ORAL
  Filled 2011-11-11 (×15): qty 1

## 2011-11-11 MED ORDER — ACD FORMULA A 0.73-2.45-2.2 GM/100ML VI SOLN
Status: AC
  Filled 2011-11-11: qty 500

## 2011-11-11 NOTE — Progress Notes (Addendum)
[facility]  Subjective:  A little nauseated this AM.  Got IV cytoxan yesterday (1g=0.g/m2) without problems.  Scheduled for 1st plasma exchange today.  IV fluids d/c'd   Objective: Vital signs in last 24 hours: T 98.5, p 86, R 18, BP 142/84 PHYSICAL EXAM General--as above Chest--tunneled cath R IJ looks ok, clear to ausc Heart--no rub Abd--renal bx site clean, nontender Extr--no edema, no rash   Date   Weight 21 Jun   71.4 kg 23 Jun   76.5 kg 25 Jun   83.6 kg 27 Jun   84.3 kg 28 Jun   NA  CT chest done yesterday shows small bilateral pulmonary nodules   Lab Results:   Lab 11/11/11 1040 11/10/11 0600 11/09/11 0645  NA 137 141 142  K 3.8 3.2* 3.3*  CL 95* 94* 91*  CO2 25 33* 35*  BUN 103* 109* 108*  CREATININE 8.23* 8.82* 9.30*  ALB -- -- --  GLUCOSE 163* -- --  CALCIUM 8.1* 7.8* 7.4*  PHOS 5.0* 6.9* 7.8*     Basename 11/10/11 0909 11/10/11 0600  WBC 8.2 7.9  HGB 7.5* 7.8*  HCT 21.8* 22.5*  PLT 171 154    Assessment/Plan: *1. 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 With multiple + serologies and multiple immunoglobulins seen on IF microscopy.  1st cytoxan given yesterday.  Still on 70 mg/d prednisone.  1st plasma exchange today (plan QOD for 7 plasma exchanges).  No need for dialysis yet .  Trim-sulfa MWF for PCP prophylaxis 2. Hypertension/volume - not hypertensive currently  3. Hyperkalemia-resolved K 3.8 today.  No more KCl  4. Metabolic acidosis- due to renal failure. Bicarb now 35. off bicarbonate drip.  5. Anemia - On aranesp. Fe/TIBC 24%, ferritin 286. Received IV Fe.  Hgb today 7.5. Also type and screen already done if he needs PRBC  6. Renal osteodystrophy--PTH 48, Ca 8, phos5.1 (on renvela 2 tid)     LOS: 7 days   Marajade Lei F 11/11/2011,12:35 PM   .labalb

## 2011-11-11 NOTE — Progress Notes (Signed)
*  Preliminary Results*   Right  Upper Extremity Vein Map    Cephalic  Segment Diameter Depth Comment  1. Axilla 0.42mm mm   2. Mid upper arm 2.78mm mm   3. Above AC 2.58mm mm   4. In AC 2.93mm mm   5. Below AC 2.75mm mm   6. Mid forearm 2.62mm mm   7. Wrist mm mm Unable to visualize.   mm mm    mm mm    mm mm    Basilic  Segment Diameter Depth Comment  1. Axilla 3.28mm 4.39mm   2. Mid upper arm 4.93mm 3.21mm Thrombosis  3. Above AC 3.12mm 3.43mm   4. In AC mm mm Unable to visualize.  5. Below AC mm mm Unable to visualize.  6. Mid forearm mm mm Unable to visualize.  7. Wrist mm mm Unable to visualize.   mm mm    mm mm    mm mm     Left Upper Extremity Vein Map    Cephalic  Segment Diameter Depth Comment  1. Axilla 0.58mm mm   2. Mid upper arm 1.66mm mm   3. Above AC 74mm mm   4. In AC mm mm Unable to visualize  5. Below AC mm mm Unable to visualize  6. Mid forearm mm mm Unable to visualize  7. Wrist mm mm Unable to visualize   mm mm    mm mm    mm mm    Basilic  Segment Diameter Depth Comment  1. Axilla 11.21mm 5.44mm   2. Prox-mid upper arm 4.52mm 7.44mm   3. Mid upper arm 8.9mm 4.4mm   4. Mid to dist upper arm 4.80mm 8.30mm   5. Distal upper arm 5.30mm 6.47mm   6. Above AC 8.31mm 5.87mm   4. In [facility] 4.70mm 42mm   5. Below AC 4.10mm 5.52mm   6. Mid forearm 1.74mm 2.77mm   7. Wrist 18mm 2.80mm    mm mm    mm mm    mm mm      The left basilic vein has multiple sites of focal dilatation in the area of the valves. In these areas sluggish rouleaux flow is seen. The right basilic vein demonstrates focal thrombosis of the mid upper arm.  11/11/2011 10:20 AM Luna Kitchens, RDMS, RDCS

## 2011-11-11 NOTE — Progress Notes (Signed)
Pt became nauseated post admin of am medications. Pt vomited x 1. Noted pills to emesis. Declines antiemetic at this time will continue to monitor.

## 2011-11-12 LAB — THERAPEUTIC PLASMA EXCHANGE (BLOOD BANK)
Unit division: 0
Unit division: 0
Unit division: 0
Unit division: 0
Unit division: 0
Unit division: 0
Unit division: 0
Unit division: 0

## 2011-11-12 LAB — DIFFERENTIAL
Eosinophils Absolute: 0.1 10*3/uL (ref 0.0–0.7)
Lymphs Abs: 1 10*3/uL (ref 0.7–4.0)
Monocytes Relative: 9 % (ref 3–12)
Neutrophils Relative %: 75 % (ref 43–77)

## 2011-11-12 LAB — RENAL FUNCTION PANEL
Albumin: 2.7 g/dL — ABNORMAL LOW (ref 3.5–5.2)
BUN: 93 mg/dL — ABNORMAL HIGH (ref 6–23)
Calcium: 8.4 mg/dL (ref 8.4–10.5)
Creatinine, Ser: 7.8 mg/dL — ABNORMAL HIGH (ref 0.50–1.35)
Phosphorus: 5.7 mg/dL — ABNORMAL HIGH (ref 2.3–4.6)

## 2011-11-12 LAB — GLUCOSE, CAPILLARY
Glucose-Capillary: 83 mg/dL (ref 70–99)
Glucose-Capillary: 96 mg/dL (ref 70–99)
Glucose-Capillary: 114 mg/dL — ABNORMAL HIGH (ref 70–99)

## 2011-11-12 LAB — CBC
HCT: 22.1 % — ABNORMAL LOW (ref 39.0–52.0)
Hemoglobin: 7.5 g/dL — ABNORMAL LOW (ref 13.0–17.0)
MCH: 29.8 pg (ref 26.0–34.0)
RBC: 2.52 MIL/uL — ABNORMAL LOW (ref 4.22–5.81)

## 2011-11-12 MED ORDER — HEPARIN SODIUM (PORCINE) 1000 UNIT/ML IJ SOLN
1000.0000 [IU] | Freq: Once | INTRAMUSCULAR | Status: DC
  Filled 2011-11-12: qty 1

## 2011-11-12 MED ORDER — CALCIUM GLUCONATE 10 % IV SOLN
2.0000 g | INTRAVENOUS | Status: DC | PRN
  Filled 2011-11-12: qty 20

## 2011-11-12 MED ORDER — SODIUM CHLORIDE 0.9 % IV SOLN
4.0000 g | INTRAVENOUS | Status: DC | PRN
  Filled 2011-11-12: qty 40

## 2011-11-12 MED ORDER — CALCIUM CARBONATE ANTACID 500 MG PO CHEW
2.0000 | CHEWABLE_TABLET | ORAL | Status: DC | PRN
  Filled 2011-11-12: qty 2

## 2011-11-12 MED ORDER — SODIUM CHLORIDE 0.9 % IV SOLN
2.0000 g | INTRAVENOUS | Status: DC | PRN
  Administered 2011-11-13 – 2011-11-21 (×3): 2 g via INTRAVENOUS
  Filled 2011-11-12 (×4): qty 20

## 2011-11-12 MED ORDER — ACETAMINOPHEN 325 MG PO TABS: 650.0000 mg | ORAL_TABLET | ORAL | Status: DC | PRN

## 2011-11-12 MED ORDER — DIPHENHYDRAMINE HCL 25 MG PO CAPS
25.0000 mg | ORAL_CAPSULE | Freq: Four times a day (QID) | ORAL | Status: DC | PRN
  Filled 2011-11-12: qty 1

## 2011-11-12 MED ORDER — ACD FORMULA A 0.73-2.45-2.2 GM/100ML VI SOLN
500.0000 mL | Status: DC
  Filled 2011-11-12: qty 500

## 2011-11-12 NOTE — Progress Notes (Signed)
Nutrition Follow-up / Consult for diet education  Intervention:    Education materials provided to patient.   Continue Nepro supplement daily to maximize oral intake.  Assessment: RD consulted for diet education. Provided nephrotic syndrome handouts from the [employer] to patient. Reviewed food groups and provided written recommended serving sizes specifically determined for patient's current nutritional status.   Explained why diet restrictions are needed and provided lists of foods to limit/avoid that are high sodium and phosphorus. Provided specific recommendations on safer alternatives of these foods. Strongly encouraged compliance of this diet.   Discussed importance of protein intake at each meal and snack. Provided examples of how to maximize protein intake throughout the day. Discussed need for fluid restriction.  Expect good compliance.  Diet Order:  Renal 90-2-2 with Nepro PO once daily  Intake of meals has been good over the past few days.  Patient consuming 75-100% of most meals.  Meds: Scheduled Meds:   . citrate dextrose      . darbepoetin (ARANESP) injection - NON-DIALYSIS  100 mcg Subcutaneous Q Sun-1800  . feeding supplement (NEPRO CARB STEADY)  237 mL Oral Q24H  . heparin  1,000 Units Intracatheter Once  . heparin  1,000 Units Intracatheter Once  . insulin aspart  0-15 Units Subcutaneous TID WC  . pantoprazole  40 mg Oral Q1200  . predniSONE  70 mg Oral Q breakfast  . sevelamer  1,600 mg Oral TID WC  . sulfamethoxazole-trimethoprim  1 tablet Oral 3 times weekly   Continuous Infusions:   . citrate dextrose    . DISCONTD: citrate dextrose     PRN Meds:.acetaminophen, albuterol, calcium carbonate, calcium gluconate IVPB, calcium gluconate IVPB, calcium gluconate, diphenhydrAMINE, ondansetron (ZOFRAN) IV, ondansetron, DISCONTD: acetaminophen, DISCONTD: calcium gluconate IVPB, DISCONTD: diphenhydrAMINE  Labs:  CMP     Component Value  Date/Time   NA 140 11/12/2011 1000   K 4.1 11/12/2011 1000   CL 99 11/12/2011 1000   CO2 27 11/12/2011 1000   GLUCOSE 140* 11/12/2011 1000   BUN 93* 11/12/2011 1000   CREATININE 7.80* 11/12/2011 1000   CALCIUM 8.4 11/12/2011 1000   PROT 6.2 11/11/2011 1040   ALBUMIN 2.7* 11/12/2011 1000   AST 14 11/11/2011 1040   ALT 21 11/11/2011 1040   ALKPHOS 55 11/11/2011 1040   BILITOT 0.2* 11/11/2011 1040   GFRNONAA 7* 11/12/2011 1000   GFRAA 9* 11/12/2011 1000   Phosphorus  Date/Time Value Range Status  11/12/2011 10:00 AM 5.7* 2.3 - 4.6 mg/dL Final  11/11/2011 10:40 AM 5.0* 2.3 - 4.6 mg/dL Final  11/10/2011  6:00 AM 6.9* 2.3 - 4.6 mg/dL Final   Potassium  Date/Time Value Range Status  11/12/2011 10:00 AM 4.1  3.5 - 5.1 mEq/L Final  11/11/2011 10:40 AM 3.8  3.5 - 5.1 mEq/L Final  11/10/2011  6:00 AM 3.2* 3.5 - 5.1 mEq/L Final    Intake/Output Summary (Last 24 hours) at 11/12/11 1359 Last data filed at 11/12/11 1100  Gross per 24 hour  Intake   1560 ml  Output      0 ml  Net   1560 ml    Weight Status:  84.2 kg (6/28) continues to trend upward  Re-estimated needs, unchanged:  2400-2600 kcals, 90-110 grams protein daily  Nutrition Dx:  Unintended weight loss, ongoing.  Goal:  Pt to meet >/= 90% of their estimated nutrition needs, met.  Monitor:  PO intake, weights, renal function.    Molli Barrows, RD, CNSC, LDN  Pager# [phone] After Hours Pager# [phone]

## 2011-11-12 NOTE — Progress Notes (Signed)
[facility]  Subjective:  Awake, alert, a little nausea yesterday, wants to know "how am I doing?"   Objective: Vital signs in last 24 hours: Blood pressure 131/80, pulse 84, temperature 98.9 F (37.2 C), temperature source Oral, resp. rate 18, height 6' (1.829 m), weight 84.2 kg (185 lb 10 oz), SpO2 98.00%.  PHYSICAL EXAM General--as above Chest--R IJ cath exit site ok, clear Heart--no rub Abd--nontender, bx site clean Extr--no edema, no rash  Date   Weight  21 Jun   71.4 kg  23 Jun  76.5 kg  25 Jun  83.6 kg  27 Jun   84.3 kg  28 Jun   84.2 kg 29 Jun  NA    Lab Results:   Lab 11/11/11 1040 11/10/11 0600 11/09/11 0645  NA 137 141 142  K 3.8 3.2* 3.3*  CL 95* 94* 91*  CO2 25 33* 35*  BUN 103* 109* 108*  CREATININE 8.23* 8.82* 9.30*  ALB -- -- --  GLUCOSE 163* -- --  CALCIUM 8.1* 7.8* 7.4*  PHOS 5.0* 6.9* 7.8*     Basename 11/10/11 0909 11/10/11 0600  WBC 8.2 7.9  HGB 7.5* 7.8*  HCT 21.8* 22.5*  PLT 171 154    Assessment/Plan: 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 1st cytoxan given 27 Jun. Still on 70 mg/d prednisone. 1st plasma exchange 28 Jun (plan QOD for 7 plasma exchanges)--next plasma exch 30 Jun. No need for dialysis yet . Trim-sulfa MWF for PCP prophylaxis  2. Hypertension/volume - not hypertensive currently  3. Hyperkalemia-resolved. No more KCl. Check lab with Plasma exch tomorrow  4. Metabolic acidosis- due to renal failure. Bicarb now 35. off bicarbonate drip.  5. Anemia - On aranesp. Fe/TIBC 24%, ferritin 286. Received IV Fe. Hgb 28 Jun 7.5. Also type and screen already done if he needs PRBC.  CBC in AM  6. 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

## 2011-11-13 LAB — RENAL FUNCTION PANEL
BUN: 105 mg/dL — ABNORMAL HIGH (ref 6–23)
Chloride: 96 mEq/L (ref 96–112)
Glucose, Bld: 150 mg/dL — ABNORMAL HIGH (ref 70–99)
Phosphorus: 5.8 mg/dL — ABNORMAL HIGH (ref 2.3–4.6)
Potassium: 4.5 mEq/L (ref 3.5–5.1)

## 2011-11-13 LAB — GLUCOSE, CAPILLARY: Glucose-Capillary: 117 mg/dL — ABNORMAL HIGH (ref 70–99)

## 2011-11-13 MED ORDER — ACD FORMULA A 0.73-2.45-2.2 GM/100ML VI SOLN
500.0000 mL | Status: DC
  Administered 2011-11-23: 500 mL via INTRAVENOUS
  Filled 2011-11-13 (×8): qty 500

## 2011-11-13 MED ORDER — ACD FORMULA A 0.73-2.45-2.2 GM/100ML VI SOLN
Status: AC
  Administered 2011-11-13: 500 mL via INTRAVENOUS
  Filled 2011-11-13: qty 1000

## 2011-11-13 NOTE — Clinical Social Work Note (Signed)
CSW discussed pt with RN. Financial counselor is following pt. CSW is signing off as no further needs identified. Please reconsult if a need arises prior to discharge.   Darden Dates, MSW, [place]

## 2011-11-13 NOTE — Progress Notes (Signed)
[facility]  Subjective:  Awake, alert, watching cartoons.  "I feel fine."   Objective: Vital signs in last 24 hours: Blood pressure 134/72, pulse 86, temperature 98.3 F (36.8 C), temperature source Oral, resp. rate 20, height 6' (1.829 m), weight 80.377 kg (177 lb 3.2 oz), SpO2 99.00%.  PHYSICAL EXAM General--as above Chest--tunneled cath R IJ Heart-- no rub Abd--nontender Extr--trace edema.  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 1st cytoxan given 27 Jun. Still on 70 mg/d prednisone. 1st plasma exchange 28 Jun (plan QOD for 7 plasma exchanges)--next plasma exch today. No need for dialysis yet . Trim-sulfa MWF for PCP prophylaxis  2. Hypertension/volume - not hypertensive currently  3. Hyperkalemia-resolved. No more KCl. Check lab with Plasma exch today  4. Metabolic acidosis- due to renal failure. Bicarb now 27. off bicarbonate drip.  5. Anemia - On aranesp. Fe/TIBC 24%, ferritin 286. Received IV Fe. Hgb on 28 Jun was 7.5. Also type and screen.   already done if he needs PRBC.  CBC today in plasma exch 6. Renal osteodystrophy--PTH 48, Ca 8.4, phos5.7 (on renvela 2 tid).  Renal profile today    LOS: 9 days   Demarie Hyneman F 11/13/2011,1:40 PM   .labalb

## 2011-11-14 LAB — THERAPEUTIC PLASMA EXCHANGE (BLOOD BANK)
Unit division: 0
Unit division: 0
Unit division: 0
Unit division: 0
Unit division: 0

## 2011-11-14 LAB — GLUCOSE, CAPILLARY
Glucose-Capillary: 79 mg/dL (ref 70–99)
Glucose-Capillary: 98 mg/dL (ref 70–99)
Glucose-Capillary: 113 mg/dL — ABNORMAL HIGH (ref 70–99)
Glucose-Capillary: 140 mg/dL — ABNORMAL HIGH (ref 70–99)

## 2011-11-14 LAB — OCCULT BLOOD X 1 CARD TO LAB, STOOL: Fecal Occult Bld: NEGATIVE

## 2011-11-14 NOTE — Progress Notes (Addendum)
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 1st cytoxan given 27 Jun. Still on 70 mg/d prednisone. 1st plasma exchange 28 Jun (plan QOD for 7 plasma exchanges)--next plasma exch today. No need for dialysis yet . Not uremic and nonoliguric. Septra prophy.  We are in a holding pattern waiting for a declaration of worsening or improvement in renal function.  2. Hypertension/volume - not hypertensive currently, but overloaded.  Begin furosemide 3. Hyperkalemia-resolved. No more KCl. Check lab with Plasma exch today  4. Metabolic acidosis- due to renal failure. Bicarb now 27. off bicarbonate drip.  5. Anemia - On aranesp. Fe/TIBC 24%, ferritin 286. Received IV Fe. Hgb on 28 Jun was 7.5.   6.Hyperphosphatemia on Renvela.   Subjective: Interval History: I feel great!  Objective: Vital signs in last 24 hours: Temp:  [97.5 F (36.4 C)-98.5 F (36.9 C)] 98.3 F (36.8 C) (07/01 0857) Pulse Rate:  [74-93] 84  (07/01 0857) Resp:  [18-26] 18  (07/01 0857) BP: (125-142)/(63-84) 141/76 mmHg (07/01 0857) SpO2:  [96 %-100 %] 96 % (07/01 0857) Weight:  [81.3 kg (179 lb 3.7 oz)] 81.3 kg (179 lb 3.7 oz) (06/30 2100) Weight change: 0.923 kg (2 lb 0.5 oz)  Intake/Output from previous day: 06/30 0701 - 07/01 0700 In: 1680 [P.O.:1680] Out: 925 [Urine:925] Intake/Output this shift:    General appearance: alert, cooperative and appears stated age Lungs clear Talkative Cor no rub, RRR Abd Soft Extrem 2+ edema Presacreal edema 1-2+ Nonfocal  Lab Results:  Basename 11/12/11 1000  WBC 6.7  HGB 7.5*  HCT 22.1*  PLT 154   BMET:  Basename 11/13/11 1700 11/12/11 1000  NA 137 140  K 4.5 4.1  CL 96 99  CO2 27 27  GLUCOSE 150* 140*  BUN 105* 93*  CREATININE 8.13* 7.80*  CALCIUM 8.4 8.4   No results found for this basename: PTH:2 in the last 72 hours Iron Studies: No results found for  this basename: IRON,TIBC,TRANSFERRIN,FERRITIN in the last 72 hours Studies/Results: No results found.  I have reviewed the patient's current medications.     LOS: 10 days   Shalynn Jorstad C 11/14/2011,1:55 PM

## 2011-11-15 ENCOUNTER — Inpatient Hospital Stay (HOSPITAL_COMMUNITY): Payer: Medicaid Other

## 2011-11-15 LAB — RENAL FUNCTION PANEL
Albumin: 3.2 g/dL — ABNORMAL LOW (ref 3.5–5.2)
Calcium: 8.8 mg/dL (ref 8.4–10.5)
GFR calc Af Amer: 8 mL/min — ABNORMAL LOW (ref >90)
GFR calc non Af Amer: 7 mL/min — ABNORMAL LOW (ref >90)
Phosphorus: 6.6 mg/dL — ABNORMAL HIGH (ref 2.3–4.6)
Sodium: 143 mEq/L (ref 135–145)

## 2011-11-15 LAB — GLUCOSE, CAPILLARY
Glucose-Capillary: 155 mg/dL — ABNORMAL HIGH (ref 70–99)
Glucose-Capillary: 103 mg/dL — ABNORMAL HIGH (ref 70–99)

## 2011-11-15 LAB — CBC
MCH: 29.9 pg (ref 26.0–34.0)
MCHC: 33.8 g/dL (ref 30.0–36.0)
RDW: 13 % (ref 11.5–15.5)

## 2011-11-15 MED ORDER — CALCIUM GLUCONATE 10 % IV SOLN: 2.0000 g | INTRAVENOUS | Status: DC | PRN

## 2011-11-15 MED ORDER — ACETAMINOPHEN 325 MG PO TABS: 650.0000 mg | ORAL_TABLET | ORAL | Status: DC | PRN

## 2011-11-15 MED ORDER — SODIUM CHLORIDE 0.9 % IV SOLN: 2.0000 g | INTRAVENOUS | Status: DC | PRN

## 2011-11-15 MED ORDER — DIPHENHYDRAMINE HCL 25 MG PO CAPS: 25.0000 mg | ORAL_CAPSULE | Freq: Four times a day (QID) | ORAL | Status: DC | PRN

## 2011-11-15 MED ORDER — HEPARIN SODIUM (PORCINE) 1000 UNIT/ML IJ SOLN
1000.0000 [IU] | Freq: Once | INTRAMUSCULAR | Status: DC
  Filled 2011-11-15: qty 1

## 2011-11-15 MED ORDER — SODIUM CHLORIDE 0.9 % IV SOLN: 4.0000 g | INTRAVENOUS | Status: DC | PRN

## 2011-11-15 MED ORDER — ACD FORMULA A 0.73-2.45-2.2 GM/100ML VI SOLN
Status: AC
  Filled 2011-11-15: qty 500

## 2011-11-15 MED ORDER — CALCIUM CARBONATE ANTACID 500 MG PO CHEW
2.0000 | CHEWABLE_TABLET | ORAL | Status: AC
  Administered 2011-11-15: 400 mg via ORAL
  Filled 2011-11-15 (×2): qty 2

## 2011-11-15 MED ORDER — SODIUM CHLORIDE 0.9 % IV SOLN
Freq: Once | INTRAVENOUS | Status: DC
  Filled 2011-11-15: qty 200

## 2011-11-15 MED ORDER — DEXTROSE 5 % IV SOLN
100.0000 mg | Freq: Three times a day (TID) | INTRAVENOUS | Status: DC
  Administered 2011-11-15 – 2011-11-17 (×7): 100 mg via INTRAVENOUS
  Filled 2011-11-15 (×12): qty 10

## 2011-11-15 MED ORDER — CALCIUM CARBONATE ANTACID 500 MG PO CHEW: 2.0000 | CHEWABLE_TABLET | ORAL | Status: DC | PRN

## 2011-11-15 NOTE — Progress Notes (Signed)
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 1st cytoxan given 27 Jun. Still on 70 mg/d prednisone. 1st plasma exchange 28 Jun (plan QOD for 7 plasma exchanges)--plasma exchange in progress. No need for dialysis yet . Not uremic and nonoliguric. Septra prophy. Will change to 5% albumin for TPE.  Treatment #3 today. We are in a holding pattern waiting for a declaration of worsening or improvement in renal function.  2. Hypertension/volume - not hypertensive currently, but overloaded.   Furosemide for diuresis and may need HD for volume 3. Hyperkalemia-resolved.  4. Metabolic acidosis- improved probably from TPE citrate 5. Anemia - On aranesp. Fe/TIBC 24%, ferritin 286. Received IV Fe. Hgb on 28 Jun was 7.5.  6.Hyperphosphatemia on Renvela.   Subjective: Interval History: Swelling in LEs  Objective: Vital signs in last 24 hours: Temp:  [97.4 F (36.3 C)-98.8 F (37.1 C)] 98.8 F (37.1 C) (07/02 1059) Pulse Rate:  [74-89] 89  (07/02 1059) Resp:  [17-18] 18  (07/02 1059) BP: (131-137)/(73-87) 136/73 mmHg (07/02 1059) SpO2:  [95 %-100 %] 100 % (07/02 1059) Weight:  [84.6 kg (186 lb 8.2 oz)] 84.6 kg (186 lb 8.2 oz) (07/01 2108) Weight change: 3.3 kg (7 lb 4.4 oz)  Intake/Output from previous day: 07/01 0701 - 07/02 0700 In: 720 [P.O.:720] Out: 1150 [Urine:1150] Intake/Output this shift:   General appearance: alert and cooperative Lungs sl decreased BS Cor RRR Ext 3+ BLE edema  Lab Results:  Basename 11/15/11 0600  WBC 8.8  HGB 7.2*  HCT 21.3*  PLT 167   BMET:  Basename 11/15/11 0600 11/13/11 1700  NA 143 137  K 4.4 4.5  CL 101 96  CO2 28 27  GLUCOSE 103* 150*  BUN 100* 105*  CREATININE 8.02* 8.13*  CALCIUM 8.8 8.4   No results found for this basename: PTH:2 in the last 72 hours Iron Studies: No results found for this basename: IRON,TIBC,TRANSFERRIN,FERRITIN in  the last 72 hours Studies/Results: No results found.  Scheduled:   . citrate dextrose      . citrate dextrose  500 mL Intravenous QODAY  . darbepoetin (ARANESP) injection - NON-DIALYSIS  100 mcg Subcutaneous Q Sun-1800  . feeding supplement (NEPRO CARB STEADY)  237 mL Oral Q24H  . heparin  1,000 Units Intracatheter Once  . heparin  1,000 Units Intracatheter Once  . insulin aspart  0-15 Units Subcutaneous TID WC  . pantoprazole  40 mg Oral Q1200  . predniSONE  70 mg Oral Q breakfast  . sevelamer  1,600 mg Oral TID WC  . sulfamethoxazole-trimethoprim  1 tablet Oral 3 times weekly      LOS: 11 days   Jeremy Mccoy C 11/15/2011,11:07 AM

## 2011-11-15 NOTE — Progress Notes (Signed)
Pt would like to keep his current peripheral IV in "since its working fine" pt says, even though its outdated. IV site assessed and flushed without any complications. Will continue to monitor.

## 2011-11-16 LAB — THERAPEUTIC PLASMA EXCHANGE (BLOOD BANK)
Unit division: 0
Unit division: 0
Unit division: 0
Unit division: 0
Unit division: 0
Unit division: 0
Unit division: 0

## 2011-11-16 LAB — GLUCOSE, CAPILLARY
Glucose-Capillary: 86 mg/dL (ref 70–99)
Glucose-Capillary: 135 mg/dL — ABNORMAL HIGH (ref 70–99)
Glucose-Capillary: 148 mg/dL — ABNORMAL HIGH (ref 70–99)

## 2011-11-16 MED ORDER — ACETAMINOPHEN 325 MG PO TABS: 650.0000 mg | ORAL_TABLET | ORAL | Status: DC | PRN

## 2011-11-16 MED ORDER — SODIUM CHLORIDE 0.9 % IV SOLN
4.0000 g | INTRAVENOUS | Status: DC | PRN
  Filled 2011-11-16: qty 40

## 2011-11-16 MED ORDER — CALCIUM GLUCONATE 10 % IV SOLN
2.0000 g | INTRAVENOUS | Status: DC | PRN
  Filled 2011-11-16: qty 20

## 2011-11-16 MED ORDER — LIDOCAINE HCL (PF) 1 % IJ SOLN: 5.0000 mL | INTRAMUSCULAR | Status: DC | PRN

## 2011-11-16 MED ORDER — NEPRO/CARBSTEADY PO LIQD: 237.0000 mL | ORAL | Status: DC | PRN

## 2011-11-16 MED ORDER — SODIUM CHLORIDE 0.9 % IV SOLN: 100.0000 mL | INTRAVENOUS | Status: DC | PRN

## 2011-11-16 MED ORDER — CALCIUM CARBONATE ANTACID 500 MG PO CHEW
2.0000 | CHEWABLE_TABLET | ORAL | Status: AC
  Administered 2011-11-16 (×2): 400 mg via ORAL
  Filled 2011-11-16 (×2): qty 2

## 2011-11-16 MED ORDER — HEPARIN SODIUM (PORCINE) 1000 UNIT/ML DIALYSIS
1000.0000 [IU] | INTRAMUSCULAR | Status: DC | PRN
  Filled 2011-11-16: qty 1

## 2011-11-16 MED ORDER — ALTEPLASE 2 MG IJ SOLR
2.0000 mg | Freq: Once | INTRAMUSCULAR | Status: AC | PRN
  Filled 2011-11-16: qty 2

## 2011-11-16 MED ORDER — PENTAFLUOROPROP-TETRAFLUOROETH EX AERO: 1.0000 "application " | INHALATION_SPRAY | CUTANEOUS | Status: DC | PRN

## 2011-11-16 MED ORDER — DIPHENHYDRAMINE HCL 25 MG PO CAPS: 25.0000 mg | ORAL_CAPSULE | Freq: Four times a day (QID) | ORAL | Status: DC | PRN

## 2011-11-16 MED ORDER — SODIUM CHLORIDE 0.9 % IV SOLN
2.0000 g | INTRAVENOUS | Status: DC | PRN
  Filled 2011-11-16: qty 20

## 2011-11-16 MED ORDER — ACD FORMULA A 0.73-2.45-2.2 GM/100ML VI SOLN
500.0000 mL | Status: DC
  Filled 2011-11-16: qty 500

## 2011-11-16 MED ORDER — HEPARIN SODIUM (PORCINE) 1000 UNIT/ML IJ SOLN
1000.0000 [IU] | Freq: Once | INTRAMUSCULAR | Status: DC
  Filled 2011-11-16: qty 1

## 2011-11-16 MED ORDER — LIDOCAINE-PRILOCAINE 2.5-2.5 % EX CREA: 1.0000 "application " | TOPICAL_CREAM | CUTANEOUS | Status: DC | PRN

## 2011-11-16 MED ORDER — SODIUM CHLORIDE 0.9 % IV SOLN
Freq: Once | INTRAVENOUS | Status: DC
  Filled 2011-11-16: qty 200

## 2011-11-16 MED ORDER — CALCIUM CARBONATE ANTACID 500 MG PO CHEW
2.0000 | CHEWABLE_TABLET | ORAL | Status: DC | PRN
  Filled 2011-11-16: qty 2

## 2011-11-16 MED ORDER — HEPARIN SODIUM (PORCINE) 1000 UNIT/ML DIALYSIS
20.0000 [IU]/kg | INTRAMUSCULAR | Status: DC | PRN
  Filled 2011-11-16: qty 2

## 2011-11-16 NOTE — Progress Notes (Signed)
Nutrition Follow-up  Intervention:   1. Discontinue Nepro Shake, pt does not want to consume them anymore 2. RD to continue to follow nutrition care plan  Assessment:   Pt educated by RD on renal diet restrictions on 6/29. Continues to eat 100% of meals. Potassium WNL. Phosphorus trending up.  Pt is very diligently tracking all of his fluid intake, ounce by ounce on a note pad at bedside.  Diet Order:  Renal 90 Supplement: Nepro Shake daily  Meds: Scheduled Meds:   . therapeutic plasma exchange solution   Dialysis Once in dialysis  . calcium carbonate  2 tablet Oral Q3H  . citrate dextrose  500 mL Intravenous QODAY  . darbepoetin (ARANESP) injection - NON-DIALYSIS  100 mcg Subcutaneous Q Sun-1800  . feeding supplement (NEPRO CARB STEADY)  237 mL Oral Q24H  . furosemide  100 mg Intravenous TID  . heparin  1,000 Units Intracatheter Once  . insulin aspart  0-15 Units Subcutaneous TID WC  . pantoprazole  40 mg Oral Q1200  . predniSONE  70 mg Oral Q breakfast  . sevelamer  1,600 mg Oral TID WC  . sulfamethoxazole-trimethoprim  1 tablet Oral 3 times weekly  . DISCONTD: citrate dextrose      . DISCONTD: heparin  1,000 Units Intracatheter Once  . DISCONTD: heparin  1,000 Units Intracatheter Once   Continuous Infusions:  PRN Meds:.acetaminophen, albuterol, calcium carbonate, calcium gluconate IVPB, calcium gluconate IVPB, calcium gluconate, diphenhydrAMINE, ondansetron (ZOFRAN) IV, ondansetron, DISCONTD: acetaminophen, DISCONTD: calcium carbonate, DISCONTD: calcium gluconate IVPB, DISCONTD: calcium gluconate IVPB, DISCONTD: calcium gluconate, DISCONTD: diphenhydrAMINE  Labs:  CMP     Component Value Date/Time   NA 143 11/15/2011 0600   K 4.4 11/15/2011 0600   CL 101 11/15/2011 0600   CO2 28 11/15/2011 0600   GLUCOSE 103* 11/15/2011 0600   BUN 100* 11/15/2011 0600   CREATININE 8.02* 11/15/2011 0600   CALCIUM 8.8 11/15/2011 0600   PROT 6.2 11/11/2011 1040   ALBUMIN 3.2* 11/15/2011 0600   AST 14  11/11/2011 1040   ALT 21 11/11/2011 1040   ALKPHOS 55 11/11/2011 1040   BILITOT 0.2* 11/11/2011 1040   GFRNONAA 7* 11/15/2011 0600   GFRAA 8* 11/15/2011 0600   Phosphorus  Date/Time Value Range Status  11/15/2011  6:00 AM 6.6* 2.3 - 4.6 mg/dL Final  11/13/2011  5:00 PM 5.8* 2.3 - 4.6 mg/dL Final  11/12/2011 10:00 AM 5.7* 2.3 - 4.6 mg/dL Final     Intake/Output Summary (Last 24 hours) at 11/16/11 0932 Last data filed at 11/16/11 0500  Gross per 24 hour  Intake   1020 ml  Output    750 ml  Net    270 ml  BM on 7/2  Weight Status:  185 lb - wt overall increasing from admit wt of 160 lb.  Body mass index is 25.09 kg/(m^2). WNL  Re-estimated needs:  2400-2600 kcals, 90-110 grams protein daily  Nutrition Dx:  Unintended weight loss, ongoing.  Goal:  Pt to meet >/= 90% of their estimated nutrition needs, met.  Monitor:  PO intake, weights, renal function  Inda Coke MS, RD, LDN Pager: [phone] After-hours pager: [phone]

## 2011-11-16 NOTE — Progress Notes (Addendum)
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 1st cytoxan given 27 Jun. Still on 70 mg/d prednisone. 1st plasma exchange 28 Jun (plan QOD for 7 plasma exchanges)--next plasma exch today. No need for dialysis yet . Not uremic and nonoliguric. Septra prophy.  Next TPE in AM.   I think we need to plan for emergency medicaid and outpatient hemodialysis as he is not responding well to any treatment yet.  Will do hemodialysis in am as well.  2. Hypertension/volume - not hypertensive, but overloaded. Getting furosemide.  5. Anemia - On aranesp. Fe/TIBC 24%, ferritin 286. Received IV Fe. Hgb today was 7.2.  6.Hyperphosphatemia on Renvela.  Subjective: Interval History: He feels well  Objective: Vital signs in last 24 hours: Temp:  [97.6 F (36.4 C)-98.8 F (37.1 C)] 98.2 F (36.8 C) (07/03 0800) Pulse Rate:  [71-102] 71  (07/03 0800) Resp:  [15-19] 18  (07/03 0800) BP: (129-149)/(64-90) 129/64 mmHg (07/03 0800) SpO2:  [96 %-100 %] 99 % (07/03 0800) Weight:  [83.915 kg (185 lb)] 83.915 kg (185 lb) (07/02 2145) Weight change: -0.685 kg (-1 lb 8.2 oz)  Intake/Output from previous day: 07/02 0701 - 07/03 0700 In: 1170 [P.O.:870; IV Piggyback:300] Out: 750 [Urine:750] Intake/Output this shift: Total I/O In: 120 [P.O.:120] Out: -   General appearance: alert, cooperative and appears stated age 19-3+ edema bil le  Lab Results:  Basename 11/15/11 0600  WBC 8.8  HGB 7.2*  HCT 21.3*  PLT 167   BMET:  Basename 11/15/11 0600 11/13/11 1700  NA 143 137  K 4.4 4.5  CL 101 96  CO2 28 27  GLUCOSE 103* 150*  BUN 100* 105*  CREATININE 8.02* 8.13*  CALCIUM 8.8 8.4   No results found for this basename: PTH:2 in the last 72 hours Iron Studies: No results found for this basename: IRON,TIBC,TRANSFERRIN,FERRITIN in the last 72 hours Studies/Results: No results found.  Scheduled:   .  therapeutic plasma exchange solution   Dialysis Once in dialysis  . calcium carbonate  2 tablet Oral Q3H  . citrate dextrose  500 mL Intravenous QODAY  . darbepoetin (ARANESP) injection - NON-DIALYSIS  100 mcg Subcutaneous Q Sun-1800  . furosemide  100 mg Intravenous TID  . heparin  1,000 Units Intracatheter Once  . insulin aspart  0-15 Units Subcutaneous TID WC  . pantoprazole  40 mg Oral Q1200  . predniSONE  70 mg Oral Q breakfast  . sevelamer  1,600 mg Oral TID WC  . sulfamethoxazole-trimethoprim  1 tablet Oral 3 times weekly  . DISCONTD: feeding supplement (NEPRO CARB STEADY)  237 mL Oral Q24H     LOS: 12 days   Jeremy Mccoy C 11/16/2011,11:57 AM

## 2011-11-17 ENCOUNTER — Inpatient Hospital Stay (HOSPITAL_COMMUNITY): Payer: Medicaid Other

## 2011-11-17 LAB — GLUCOSE, CAPILLARY
Glucose-Capillary: 82 mg/dL (ref 70–99)
Glucose-Capillary: 164 mg/dL — ABNORMAL HIGH (ref 70–99)
Glucose-Capillary: 145 mg/dL — ABNORMAL HIGH (ref 70–99)

## 2011-11-17 LAB — RENAL FUNCTION PANEL
BUN: 100 mg/dL — ABNORMAL HIGH (ref 6–23)
CO2: 28 mEq/L (ref 19–32)
GFR calc Af Amer: 9 mL/min — ABNORMAL LOW (ref >90)
Glucose, Bld: 115 mg/dL — ABNORMAL HIGH (ref 70–99)
Phosphorus: 6.3 mg/dL — ABNORMAL HIGH (ref 2.3–4.6)
Potassium: 4 mEq/L (ref 3.5–5.1)
Sodium: 141 mEq/L (ref 135–145)

## 2011-11-17 LAB — CBC
HCT: 20.7 % — ABNORMAL LOW (ref 39.0–52.0)
Hemoglobin: 7.5 g/dL — ABNORMAL LOW (ref 13.0–17.0)
MCHC: 36.2 g/dL — ABNORMAL HIGH (ref 30.0–36.0)
RBC: 2.32 MIL/uL — ABNORMAL LOW (ref 4.22–5.81)

## 2011-11-17 MED ORDER — SODIUM CHLORIDE 0.9 % IV SOLN
2.0000 g | Freq: Once | INTRAVENOUS | Status: AC
  Administered 2011-11-19: 2 g via INTRAVENOUS
  Filled 2011-11-17: qty 20

## 2011-11-17 MED ORDER — ANTICOAGULANT SODIUM CITRATE 4% (200MG/5ML) IV SOLN
5.0000 mL | Status: AC
  Administered 2011-11-17: 5 mL via INTRAVENOUS
  Filled 2011-11-17: qty 250

## 2011-11-17 MED ORDER — SODIUM CHLORIDE 0.9 % IV SOLN
2.0000 g | Freq: Once | INTRAVENOUS | Status: AC
  Administered 2011-11-17: 2 g via INTRAVENOUS
  Filled 2011-11-17 (×2): qty 20

## 2011-11-17 MED ORDER — SODIUM CHLORIDE 0.9 % IV SOLN
INTRAVENOUS | Status: AC
  Administered 2011-11-17 (×3): via INTRAVENOUS_CENTRAL
  Filled 2011-11-17 (×4): qty 200

## 2011-11-17 MED ORDER — ALBUMIN HUMAN 25 % IV SOLN: 50.0000 g | INTRAVENOUS | Status: DC

## 2011-11-17 MED ORDER — ACD FORMULA A 0.73-2.45-2.2 GM/100ML VI SOLN
Status: AC
  Filled 2011-11-17: qty 500

## 2011-11-17 NOTE — Progress Notes (Signed)
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 1st cytoxan given 27 Jun. Still on 70 mg/d prednisone. 1st plasma exchange 28 Jun (plan QOD for 7 plasma exchanges)--next plasma exch today. No need for dialysis yet . Not uremic and nonoliguric. Septra prophy. Urine out put is excellent! I think we need to plan for emergency medicaid and outpatient hemodialysis as he is not responding well to any treatment yet. Will do hemodialysis in am as well.  2. Hypertension/volume - not hypertensive, but overloaded. Getting furosemide.  5. Anemia - On aranesp. Fe/TIBC 24%, ferritin 286. Received IV Fe. Hgb today was 7.2.  6.Hyperphosphatemia on Renvela.  Subjective: Interval History: Increased urine output. Feels good. Hemodialysis done today.  Objective: Vital signs in last 24 hours: Temp:  [97.2 F (36.2 C)-98.7 F (37.1 C)] 97.2 F (36.2 C) (07/04 0946) Pulse Rate:  [60-85] 66  (07/04 1030) Resp:  [10-21] 13  (07/04 1030) BP: (119-137)/(70-89) 127/75 mmHg (07/04 1030) SpO2:  [96 %-100 %] 100 % (07/04 1030) Weight:  [82.2 kg (181 lb 3.5 oz)-82.4 kg (181 lb 10.5 oz)] 82.2 kg (181 lb 3.5 oz) (07/04 0645) Weight change: -1.516 kg (-3 lb 5.5 oz)  Intake/Output from previous day: 07/03 0701 - 07/04 0700 In: 1380 [P.O.:1220; IV Piggyback:160] Out: [id number] [Urine:3375] Intake/Output this shift: Total I/O In: -  Out: 3101 [Other:3101]  Alert and appropriate Ext 2+ edema, decreased after HD Neuro NF  Lab Results:  Basename 11/17/11 0729 11/15/11 0600  WBC 8.9 8.8  HGB 7.5* 7.2*  HCT 20.7* 21.3*  PLT 170 167   BMET:  Basename 11/17/11 0730 11/15/11 0600  NA 141 143  K 4.0 4.4  CL 98 101  CO2 28 28  GLUCOSE 115* 103*  BUN 100* 100*  CREATININE 7.84* 8.02*  CALCIUM 8.5 8.8   No results found for this basename: PTH:2 in the last 72 hours Iron Studies: No results found for this basename:  IRON,TIBC,TRANSFERRIN,FERRITIN in the last 72 hours Studies/Results: No results found.  Scheduled:   . therapeutic plasma exchange solution   Dialysis Once in dialysis  . therapeutic plasma exchange solution   Dialysis Once in dialysis  . therapeutic plasma exchange solution   Dialysis Q1 Hr x 4  . calcium carbonate  2 tablet Oral Q3H  . calcium gluconate  2 g Intravenous Once  . citrate dextrose      . citrate dextrose  500 mL Intravenous QODAY  . darbepoetin (ARANESP) injection - NON-DIALYSIS  100 mcg Subcutaneous Q Sun-1800  . furosemide  100 mg Intravenous TID  . heparin  1,000 Units Intracatheter Once  . heparin  1,000 Units Intracatheter Once  . insulin aspart  0-15 Units Subcutaneous TID WC  . pantoprazole  40 mg Oral Q1200  . predniSONE  70 mg Oral Q breakfast  . sevelamer  1,600 mg Oral TID WC  . sulfamethoxazole-trimethoprim  1 tablet Oral 3 times weekly  . DISCONTD: albumin human  50 g Intravenous Q1 Hr x 4      LOS: 13 days   Milda Lindvall C 11/17/2011,10:57 AM

## 2011-11-18 ENCOUNTER — Inpatient Hospital Stay (HOSPITAL_COMMUNITY): Payer: Medicaid Other

## 2011-11-18 LAB — PREPARE FRESH FROZEN PLASMA: Unit division: 0

## 2011-11-18 LAB — GLUCOSE, CAPILLARY
Glucose-Capillary: 106 mg/dL — ABNORMAL HIGH (ref 70–99)
Glucose-Capillary: 146 mg/dL — ABNORMAL HIGH (ref 70–99)
Glucose-Capillary: 124 mg/dL — ABNORMAL HIGH (ref 70–99)

## 2011-11-18 MED ORDER — CALCIUM CARBONATE ANTACID 500 MG PO CHEW: 2.0000 | CHEWABLE_TABLET | ORAL | Status: DC | PRN

## 2011-11-18 MED ORDER — HEPARIN SODIUM (PORCINE) 1000 UNIT/ML IJ SOLN
1000.0000 [IU] | Freq: Once | INTRAMUSCULAR | Status: DC
  Filled 2011-11-18: qty 1

## 2011-11-18 MED ORDER — ACETAMINOPHEN 325 MG PO TABS: 650.0000 mg | ORAL_TABLET | ORAL | Status: DC | PRN

## 2011-11-18 MED ORDER — RENA-VITE PO TABS
1.0000 | ORAL_TABLET | Freq: Every day | ORAL | Status: DC
  Administered 2011-11-18 – 2011-11-22 (×5): 1 via ORAL
  Filled 2011-11-18 (×5): qty 1

## 2011-11-18 MED ORDER — SODIUM CHLORIDE 0.9 % IV SOLN
Freq: Once | INTRAVENOUS | Status: DC
  Filled 2011-11-18: qty 200

## 2011-11-18 MED ORDER — CALCIUM CARBONATE ANTACID 500 MG PO CHEW
2.0000 | CHEWABLE_TABLET | ORAL | Status: AC
  Administered 2011-11-18 (×2): 400 mg via ORAL
  Filled 2011-11-18 (×2): qty 2

## 2011-11-18 MED ORDER — DIPHENHYDRAMINE HCL 25 MG PO CAPS: 25.0000 mg | ORAL_CAPSULE | Freq: Four times a day (QID) | ORAL | Status: DC | PRN

## 2011-11-18 MED ORDER — HEPARIN SODIUM (PORCINE) 1000 UNIT/ML DIALYSIS: 20.0000 [IU]/kg | INTRAMUSCULAR | Status: DC | PRN

## 2011-11-18 MED ORDER — SODIUM CHLORIDE 0.9 % IV SOLN: 4.0000 g | INTRAVENOUS | Status: DC | PRN

## 2011-11-18 MED ORDER — CALCIUM GLUCONATE 10 % IV SOLN: 2.0000 g | INTRAVENOUS | Status: DC | PRN

## 2011-11-18 MED ORDER — FUROSEMIDE 80 MG PO TABS
160.0000 mg | ORAL_TABLET | Freq: Two times a day (BID) | ORAL | Status: DC
  Administered 2011-11-18 (×2): 160 mg via ORAL
  Filled 2011-11-18 (×5): qty 2

## 2011-11-18 MED ORDER — ACD FORMULA A 0.73-2.45-2.2 GM/100ML VI SOLN
500.0000 mL | Status: DC
  Filled 2011-11-18 (×2): qty 500

## 2011-11-18 MED ORDER — HEPARIN SODIUM (PORCINE) 1000 UNIT/ML DIALYSIS
20.0000 [IU]/kg | INTRAMUSCULAR | Status: DC | PRN
  Filled 2011-11-18: qty 2

## 2011-11-18 MED ORDER — SODIUM CHLORIDE 0.9 % IV SOLN: 2.0000 g | INTRAVENOUS | Status: DC | PRN

## 2011-11-18 NOTE — Clinical Social Work Note (Signed)
CSW talked with Jenny Reichmann in financial counseling regarding Medicaid application on patient. Per Jenny Reichmann she has been following patient and will meet with patient today to initiate Medicaid application. Financial Counselor advised that patient in dialysis today.  Randen Kauth Givens, MSW, LCSW [phone]

## 2011-11-18 NOTE — Procedures (Signed)
Tolerating HD No catheter issues. Hemodynamics are stable.   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 1st cytoxan given 27 Jun. Still on 70 mg/d prednisone. 1st plasma exchange 28 Jun (plan QOD for 7 plasma exchanges)--next plasma exch today. No need for dialysis yet . Not uremic and nonoliguric. Septra prophy. Urine out put is excellent!  Plan for emergency medicaid and outpatient hemodialysis as he is not responding well to any treatment yet. Will do hemodialysis in am  Of a short duration and TPE as well. Begin nephrovite. 2. Hypertension/volume - not hypertensive, but overloaded. Getting furosemide IV will change to PO 5. Anemia -   6.Hyperphosphatemia on Renvela.   Jeremy Mccoy

## 2011-11-19 ENCOUNTER — Inpatient Hospital Stay (HOSPITAL_COMMUNITY): Payer: Medicaid Other

## 2011-11-19 LAB — GLUCOSE, CAPILLARY: Glucose-Capillary: 154 mg/dL — ABNORMAL HIGH (ref 70–99)

## 2011-11-19 LAB — CBC
HCT: 20.6 % — ABNORMAL LOW (ref 39.0–52.0)
Hemoglobin: 6.8 g/dL — CL (ref 13.0–17.0)
RBC: 2.28 MIL/uL — ABNORMAL LOW (ref 4.22–5.81)

## 2011-11-19 LAB — RENAL FUNCTION PANEL
BUN: 58 mg/dL — ABNORMAL HIGH (ref 6–23)
CO2: 25 mEq/L (ref 19–32)
Chloride: 101 mEq/L (ref 96–112)
GFR calc Af Amer: 13 mL/min — ABNORMAL LOW (ref >90)
Glucose, Bld: 154 mg/dL — ABNORMAL HIGH (ref 70–99)
Potassium: 4.1 mEq/L (ref 3.5–5.1)
Sodium: 140 mEq/L (ref 135–145)

## 2011-11-19 MED ORDER — SODIUM CHLORIDE 0.9 % IV SOLN
INTRAVENOUS | Status: AC
  Administered 2011-11-19 (×3): via INTRAVENOUS_CENTRAL
  Filled 2011-11-19 (×3): qty 200

## 2011-11-19 MED ORDER — DARBEPOETIN ALFA-POLYSORBATE 150 MCG/0.3ML IJ SOLN
150.0000 ug | INTRAMUSCULAR | Status: DC
  Administered 2011-11-20 – 2011-11-28 (×2): 150 ug via SUBCUTANEOUS
  Filled 2011-11-19 (×2): qty 0.3

## 2011-11-19 MED ORDER — ACD FORMULA A 0.73-2.45-2.2 GM/100ML VI SOLN
Status: AC
  Filled 2011-11-19: qty 500

## 2011-11-19 MED ORDER — ALBUMIN HUMAN 25 % IV SOLN
50.0000 g | INTRAVENOUS | Status: AC
  Filled 2011-11-19 (×3): qty 200

## 2011-11-19 MED ORDER — SODIUM CHLORIDE 0.9 % IV SOLN
Freq: Once | INTRAVENOUS | Status: AC
  Administered 2011-11-19: 12:00:00 via INTRAVENOUS_CENTRAL
  Filled 2011-11-19: qty 200

## 2011-11-19 MED ORDER — HEPARIN SODIUM (PORCINE) 1000 UNIT/ML DIALYSIS: 20.0000 [IU]/kg | INTRAMUSCULAR | Status: DC | PRN

## 2011-11-19 MED ORDER — ANTICOAGULANT SODIUM CITRATE 4% (200MG/5ML) IV SOLN
5.0000 mL | Freq: Once | Status: DC
  Filled 2011-11-19: qty 250

## 2011-11-19 NOTE — Progress Notes (Signed)
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 1st cytoxan given 27 Jun. Still on 70 mg/d prednisone. 1st plasma exchange 28 Jun (plan QOD for 7 plasma exchanges)--next plasma exch today. No need for dialysis yet . Not uremic and nonoliguric. Septra prophy. Urine out put is excellent! #5  TPE today Plan for emergency medicaid and outpatient hemodialysis as he is not responding well to any treatment yet. Will do hemodialysis in am Of a short duration and TPE as well. Begin nephrovite.  2. Hypertension/volume - not hypertensive, but overloaded. Getting furosemide PO  5. Anemia -HgB variation, check stools, transfuse  6.Hyperphosphatemia on Renvela.   Subjective: Interval History: Feels good.  Objective: Vital signs in last 24 hours: Temp:  [96.9 F (36.1 C)-98.6 F (37 C)] 96.9 F (36.1 C) (07/06 0815) Pulse Rate:  [66-89] 84  (07/06 0815) Resp:  [13-18] 18  (07/06 0815) BP: (104-149)/(67-88) 130/83 mmHg (07/06 0815) SpO2:  [96 %-100 %] 99 % (07/06 0455) Weight:  [74.7 kg (164 lb 10.9 oz)-79.9 kg (176 lb 2.4 oz)] 74.7 kg (164 lb 10.9 oz) (07/05 2051) Weight change: 1.35 kg (2 lb 15.6 oz)  Intake/Output from previous day: 07/05 0701 - 07/06 0700 In: 240 [P.O.:240] Out: 4001 [Urine:1000] Intake/Output this shift:    General appearance: alert, cooperative and appears stated age Edema less  Lab Results:  [facility] 11/19/11 0602 11/17/11 0729  WBC 7.5 8.9  HGB 6.8* 7.5*  HCT 20.6* 20.7*  PLT 183 170   BMET:  Basename 11/19/11 0602 11/17/11 0730  NA 140 141  K 4.1 4.0  CL 101 98  CO2 25 28  GLUCOSE 154* 115*  BUN 58* 100*  CREATININE 5.41* 7.84*  CALCIUM 8.4 8.5   No results found for this basename: PTH:2 in the last 72 hours Iron Studies: No results found for this basename: IRON,TIBC,TRANSFERRIN,FERRITIN in the last 72 hours Studies/Results: No results  found.  Scheduled:   . albumin human  50 g Intravenous Q1 Hr x 3  . therapeutic plasma exchange solution   Dialysis Once in dialysis  . therapeutic plasma exchange solution   Dialysis Q1 Hr x 3  . therapeutic plasma exchange solution   Dialysis Once  . anticoagulant sodium citrate  5 mL Intravenous Once  . calcium carbonate  2 tablet Oral Q3H  . calcium gluconate  2 g Intravenous Once  . citrate dextrose      . citrate dextrose  500 mL Intravenous QODAY  . darbepoetin (ARANESP) injection - NON-DIALYSIS  100 mcg Subcutaneous Q Sun-1800  . furosemide  160 mg Oral BID  . heparin  1,000 Units Intracatheter Once  . insulin aspart  0-15 Units Subcutaneous TID WC  . multivitamin  1 tablet Oral Daily  . pantoprazole  40 mg Oral Q1200  . predniSONE  70 mg Oral Q breakfast  . sevelamer  1,600 mg Oral TID WC  . sulfamethoxazole-trimethoprim  1 tablet Oral 3 times weekly  . DISCONTD: therapeutic plasma exchange solution   Dialysis Once in dialysis  . DISCONTD: therapeutic plasma exchange solution   Dialysis Once in dialysis  . DISCONTD: furosemide  100 mg Intravenous TID  . DISCONTD: heparin  1,000 Units Intracatheter Once  . DISCONTD: heparin  1,000 Units Intracatheter Once      LOS: 15 days   Kaliyah Gladman C 11/19/2011,8:26 AM

## 2011-11-19 NOTE — Progress Notes (Signed)
Subjective: Doing well, in good spirits. Good UOP over 1 L / day.   Objective Vital signs in last 24 hours: Filed Vitals:   11/19/11 1215 11/19/11 1230 11/19/11 1234 11/19/11 1253  BP: 136/65 124/72 124/72 125/80  Pulse: 78 77 79   Temp:   98 F (36.7 C) 97.2 F (36.2 C)  TempSrc:   Temporal Oral  Resp: 15 22 24    Height:      Weight:      SpO2: 100% 100% 100%    Weight change: 1.35 kg (2 lb 15.6 oz)  Intake/Output Summary (Last 24 hours) at 11/19/11 1444 Last data filed at 11/19/11 1016  Gross per 24 hour  Intake   1180 ml  Output   3000 ml  Net  -1820 ml   Labs: Basic Metabolic Panel:  Lab [id number] 0602 11/17/11 0730 11/15/11 0600 11/13/11 1700  NA 140 141 143 137  K 4.1 4.0 4.4 4.5  CL 101 98 101 96  CO2 25 28 28 27   GLUCOSE 154* 115* 103* 150*  BUN 58* 100* 100* 105*  CREATININE 5.41* 7.84* 8.02* 8.13*  ALB -- -- -- --  CALCIUM 8.4 8.5 8.8 8.4  PHOS 5.3* 6.3* 6.6* 5.8*   Liver Function Tests:  Lab 11/19/11 0602 11/17/11 0730 11/15/11 0600  AST -- -- --  ALT -- -- --  ALKPHOS -- -- --  BILITOT -- -- --  PROT -- -- --  ALBUMIN 3.7 3.3* 3.2*   No results found for this basename: LIPASE:3,AMYLASE:3 in the last 168 hours No results found for this basename: AMMONIA:3 in the last 168 hours CBC:  Lab 11/19/11 0602 11/17/11 0729 11/15/11 0600  WBC 7.5 8.9 8.8  NEUTROABS -- -- --  HGB 6.8* 7.5* 7.2*  HCT 20.6* 20.7* 21.3*  MCV 90.4 89.2 88.4  PLT 183 170 167   PT/INR: @labrcntip (inr:5) Cardiac Enzymes: No results found for this basename: CKTOTAL:5,CKMB:5,CKMBINDEX:5,TROPONINI:5 in the last 168 hours CBG:  Lab 11/19/11 1014 11/18/11 2049 11/18/11 1659 11/18/11 1312 11/18/11 1224  GLUCAP 97 124* 146* 106* 66*    Iron Studies: No results found for this basename: IRON:30,TIBC:30,TRANSFERRIN:30,FERRITIN:30 in the last 168 hours  Physical Exam:  Blood pressure 125/80, pulse 79, temperature 97.2 F (36.2 C), temperature source Oral, resp. rate 24, height  6' (1.829 m), weight 74.6 kg (164 lb 7.4 oz), SpO2 100.00%.  Impression/Plan 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 Being treated with IV cytoxan (0.5gm/m2 monthly, #1 dose given on 6/27), steroids (pred 70/d) and plasmapheresis (started 6/28, #6/7 TPE today). If no recovery in 3 months, [facility] recommended d/c cytoxan. Plan for emergency medicaid and prepare for outpatient HD. 3rd HD done early today.  2. Hypertension/volume - weight down 10 kg since starting HD.  Will d/c po lasix, still has some ankle edema. Not on any BP meds. 3. Anemia of renal failure- Hb 7's, down to 6.8 today > transfused 2u PRBC's this am with HD. Increase Aranesp 150/wk, Fe Sat 24%.  4. Hyperphosphatemia- on Renvela.   Kelly Splinter  MD [facility] [phone] pgr    [phone] cell 11/19/2011, 2:44 PM

## 2011-11-19 NOTE — Progress Notes (Signed)
CRITICAL VALUE ALERT  Critical value received:  hemoglobin 6.8  Date of notification:  11/19/11  Time of notification:  0645  Critical value read back:YES  Nurse who received alert:  Kateri Mc  MD notified (1st page):  DR. Florene Glen  Time of first page:  [phone]  MD notified (2nd page):  Time of second page:  Responding MD:  Dr. Florene Glen  Time MD responded:  DR. Garwin Brothers

## 2011-11-19 NOTE — Progress Notes (Signed)
Charted on wrong patient

## 2011-11-20 LAB — TYPE AND SCREEN
ABO/RH(D): O POS
Antibody Screen: NEGATIVE
Unit division: 0

## 2011-11-20 LAB — CBC
MCV: 89.9 fL (ref 78.0–100.0)
Platelets: 123 10*3/uL — ABNORMAL LOW (ref 150–400)
RBC: 2.87 MIL/uL — ABNORMAL LOW (ref 4.22–5.81)
WBC: 5.8 10*3/uL (ref 4.0–10.5)

## 2011-11-20 LAB — RENAL FUNCTION PANEL
Albumin: 4.1 g/dL (ref 3.5–5.2)
BUN: 42 mg/dL — ABNORMAL HIGH (ref 6–23)
CO2: 24 mEq/L (ref 19–32)
Chloride: 104 mEq/L (ref 96–112)
Potassium: 4.6 mEq/L (ref 3.5–5.1)

## 2011-11-20 LAB — GLUCOSE, CAPILLARY
Glucose-Capillary: 82 mg/dL (ref 70–99)
Glucose-Capillary: 139 mg/dL — ABNORMAL HIGH (ref 70–99)

## 2011-11-20 NOTE — Progress Notes (Signed)
Subjective: No complaints.   Objective Vital signs in last 24 hours: Filed Vitals:   11/19/11 1956 11/20/11 0459 11/20/11 0500 11/20/11 0950  BP: 129/88 128/75  131/78  Pulse: 84 82  95  Temp: 98.6 F (37 C) 97.9 F (36.6 C)  98.5 F (36.9 C)  TempSrc: Oral Oral  Oral  Resp: 24 22  20   Height:      Weight:   79.1 kg (174 lb 6.1 oz)   SpO2: 98% 96%  95%   Weight change: -5.3 kg (-11 lb 11 oz)  Intake/Output Summary (Last 24 hours) at 11/20/11 1217 Last data filed at 11/20/11 0900  Gross per 24 hour  Intake    720 ml  Output    950 ml  Net   -230 ml   Labs: Basic Metabolic Panel:  Lab [id number] 0602 11/17/11 0730 11/15/11 0600 11/13/11 1700  NA 140 141 143 137  K 4.1 4.0 4.4 4.5  CL 101 98 101 96  CO2 25 28 28 27   GLUCOSE 154* 115* 103* 150*  BUN 58* 100* 100* 105*  CREATININE 5.41* 7.84* 8.02* 8.13*  ALB -- -- -- --  CALCIUM 8.4 8.5 8.8 8.4  PHOS 5.3* 6.3* 6.6* 5.8*   Liver Function Tests:  Lab 11/19/11 0602 11/17/11 0730 11/15/11 0600  AST -- -- --  ALT -- -- --  ALKPHOS -- -- --  BILITOT -- -- --  PROT -- -- --  ALBUMIN 3.7 3.3* 3.2*   No results found for this basename: LIPASE:3,AMYLASE:3 in the last 168 hours No results found for this basename: AMMONIA:3 in the last 168 hours CBC:  Lab 11/19/11 0602 11/17/11 0729 11/15/11 0600  WBC 7.5 8.9 8.8  NEUTROABS -- -- --  HGB 6.8* 7.5* 7.2*  HCT 20.6* 20.7* 21.3*  MCV 90.4 89.2 88.4  PLT 183 170 167   PT/INR: @labrcntip (inr:5) Cardiac Enzymes: No results found for this basename: CKTOTAL:5,CKMB:5,CKMBINDEX:5,TROPONINI:5 in the last 168 hours CBG:  Lab 11/20/11 0752 11/19/11 2112 11/19/11 1642 11/19/11 1014 11/18/11 2049  GLUCAP 73 154* 118* 97 124*    Iron Studies: No results found for this basename: IRON:30,TIBC:30,TRANSFERRIN:30,FERRITIN:30 in the last 168 hours  Physical Exam:  Blood pressure 131/78, pulse 95, temperature 98.5 F (36.9 C), temperature source Oral, resp. rate 20, height 6'  (1.829 m), weight 79.1 kg (174 lb 6.1 oz), SpO2 95.00%.  Impression/Plan 1. 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 Being treated with IV cytoxan (0.5gm/m2 monthly, #1 dose given on 6/27), steroids (pred 70/d) and plasmapheresis (started 6/28, #6/7 TPE on 7/6) per recommendations from [facility] If no recovery in 3 months, they recommended d/c cytoxan. Plan for emergency medicaid and prepare for outpatient HD also. 3rd HD done Sat, will check creat today and tomorrow to look for any sign of recovery. Still has some extra volume.  2. Hypertension/volume - weight down 10 kg since starting HD. Not on any BP meds. 3. Anemia of renal failure- Hb 7's, down to 6.8 today > transfused 2u PRBC's 7/6 with HD. Increased Aranesp 150/wk, Fe Sat 24%.  4. Hyperphosphatemia- on Renvela.   Jeremy Splinter  MD [facility] [phone] pgr    [phone] cell 11/20/2011, 12:17 PM

## 2011-11-21 ENCOUNTER — Inpatient Hospital Stay (HOSPITAL_COMMUNITY): Payer: Medicaid Other

## 2011-11-21 LAB — CBC
Platelets: 137 10*3/uL — ABNORMAL LOW (ref 150–400)
RBC: 2.93 MIL/uL — ABNORMAL LOW (ref 4.22–5.81)
WBC: 5.8 10*3/uL (ref 4.0–10.5)

## 2011-11-21 LAB — GLUCOSE, CAPILLARY
Glucose-Capillary: 72 mg/dL (ref 70–99)
Glucose-Capillary: 128 mg/dL — ABNORMAL HIGH (ref 70–99)
Glucose-Capillary: 104 mg/dL — ABNORMAL HIGH (ref 70–99)

## 2011-11-21 LAB — RENAL FUNCTION PANEL
Albumin: 4 g/dL (ref 3.5–5.2)
Chloride: 103 mEq/L (ref 96–112)
GFR calc non Af Amer: 12 mL/min — ABNORMAL LOW (ref >90)
Potassium: 4.3 mEq/L (ref 3.5–5.1)

## 2011-11-21 MED ORDER — HEPARIN SODIUM (PORCINE) 1000 UNIT/ML IJ SOLN
1000.0000 [IU] | Freq: Once | INTRAMUSCULAR | Status: AC
  Administered 2011-11-21: 1000 [IU]
  Filled 2011-11-21: qty 1

## 2011-11-21 MED ORDER — SODIUM CHLORIDE 0.9 % IV SOLN
Freq: Once | INTRAVENOUS | Status: AC
  Administered 2011-11-21: 18:00:00 via INTRAVENOUS_CENTRAL
  Filled 2011-11-21 (×2): qty 200

## 2011-11-21 MED ORDER — SODIUM CHLORIDE 0.9 % IV SOLN
Freq: Once | INTRAVENOUS | Status: AC
  Administered 2011-11-21: 20:00:00 via INTRAVENOUS_CENTRAL
  Filled 2011-11-21: qty 200

## 2011-11-21 MED ORDER — CALCIUM CARBONATE ANTACID 500 MG PO CHEW: 2.0000 | CHEWABLE_TABLET | ORAL | Status: DC | PRN

## 2011-11-21 MED ORDER — ACD FORMULA A 0.73-2.45-2.2 GM/100ML VI SOLN
Status: AC
  Administered 2011-11-21: 42 mL
  Filled 2011-11-21: qty 500

## 2011-11-21 MED ORDER — DIPHENHYDRAMINE HCL 25 MG PO CAPS: 25.0000 mg | ORAL_CAPSULE | Freq: Four times a day (QID) | ORAL | Status: DC | PRN

## 2011-11-21 MED ORDER — CALCIUM GLUCONATE 10 % IV SOLN
2.0000 g | Freq: Once | INTRAVENOUS | Status: DC
  Filled 2011-11-21: qty 20

## 2011-11-21 MED ORDER — SODIUM CHLORIDE 0.9 % IV SOLN
Freq: Once | INTRAVENOUS | Status: AC
  Administered 2011-11-21: 19:00:00 via INTRAVENOUS_CENTRAL
  Filled 2011-11-21 (×2): qty 200

## 2011-11-21 MED ORDER — SODIUM BICARBONATE 650 MG PO TABS
1300.0000 mg | ORAL_TABLET | Freq: Two times a day (BID) | ORAL | Status: DC
  Administered 2011-11-21 – 2011-11-24 (×8): 1300 mg via ORAL
  Filled 2011-11-21 (×10): qty 2

## 2011-11-21 MED ORDER — WHITE PETROLATUM GEL
Status: AC
  Filled 2011-11-21: qty 5

## 2011-11-21 MED ORDER — ACETAMINOPHEN 325 MG PO TABS: 650.0000 mg | ORAL_TABLET | ORAL | Status: DC | PRN

## 2011-11-21 MED ORDER — CALCIUM GLUCONATE 10 % IV SOLN: 2.0000 g | INTRAVENOUS | Status: DC | PRN

## 2011-11-21 MED ORDER — SODIUM CHLORIDE 0.9 % IV SOLN: 2.0000 g | INTRAVENOUS | Status: DC | PRN

## 2011-11-21 MED ORDER — SODIUM CHLORIDE 0.9 % IV SOLN
Freq: Once | INTRAVENOUS | Status: AC
  Administered 2011-11-21: 19:00:00 via INTRAVENOUS_CENTRAL
  Filled 2011-11-21: qty 200

## 2011-11-21 MED ORDER — SODIUM CHLORIDE 0.9 % IV SOLN: 4.0000 g | INTRAVENOUS | Status: DC | PRN

## 2011-11-21 MED ORDER — ACD FORMULA A 0.73-2.45-2.2 GM/100ML VI SOLN: 500.0000 mL | Status: DC

## 2011-11-21 NOTE — Progress Notes (Signed)
Subjective: Interval History: none.  Objective: Vital signs in last 24 hours: Temp:  [97.3 F (36.3 C)-98.3 F (36.8 C)] 97.9 F (36.6 C) (07/08 1004) Pulse Rate:  [76-90] 82  (07/08 1004) Resp:  [18-22] 18  (07/08 1004) BP: (129-142)/(75-87) 133/84 mmHg (07/08 1004) SpO2:  [94 %-98 %] 96 % (07/08 1004) Weight:  [79.017 kg (174 lb 3.2 oz)] 79.017 kg (174 lb 3.2 oz) (07/07 2204) Weight change: 4.417 kg (9 lb 11.8 oz)  Intake/Output from previous day: 07/07 0701 - 07/08 0700 In: 960 [P.O.:960] Out: 1275 [Urine:1275] Intake/Output this shift: Total I/O In: 240 [P.O.:240] Out: -   General appearance: alert, cooperative and pale Neck: RIJ cath Resp: clear to auscultation bilaterally Cardio: S1, S2 normal and systolic murmur: systolic ejection 2/6, decrescendo at 2nd left intercostal space GI: soft,pos bs, liver down 4 cm  Lab Results:  Basename 11/21/11 0527 11/20/11 1148  WBC 5.8 5.8  HGB 8.9* 8.6*  HCT 26.5* 25.8*  PLT 137* 123*   BMET:  Basename 11/21/11 0527 11/20/11 1141  NA 139 139  K 4.3 4.6  CL 103 104  CO2 17* 24  GLUCOSE 93 86  BUN 56* 42*  CREATININE 5.21* 4.78*  CALCIUM 8.6 8.5   No results found for this basename: PTH:2 in the last 72 hours Iron Studies: No results found for this basename: IRON,TIBC,TRANSFERRIN,FERRITIN in the last 72 hours  Studies/Results: No results found.  I have reviewed the patient's current medications.  Assessment/Plan: 1 ARF nonoliguric with little function.  Will watch and cont immunosuppress tx. 2 Anemia on epo 3 HTN controlled 4 ANCA pos GN 5 Substance abuse P PE, CTX, Pred. Follow chem    LOS: 17 days   Geneen Dieter L 11/21/2011,10:49 AM

## 2011-11-22 LAB — DIFFERENTIAL
Basophils Relative: 0 % (ref 0–1)
Lymphocytes Relative: 14 % (ref 12–46)
Lymphs Abs: 1 10*3/uL (ref 0.7–4.0)
Monocytes Absolute: 0.7 10*3/uL (ref 0.1–1.0)
Monocytes Relative: 9 % (ref 3–12)
Neutro Abs: 5.4 10*3/uL (ref 1.7–7.7)

## 2011-11-22 LAB — COMPREHENSIVE METABOLIC PANEL
Alkaline Phosphatase: 29 U/L — ABNORMAL LOW (ref 39–117)
BUN: 73 mg/dL — ABNORMAL HIGH (ref 6–23)
CO2: 19 mEq/L (ref 19–32)
Chloride: 105 mEq/L (ref 96–112)
GFR calc Af Amer: 12 mL/min — ABNORMAL LOW (ref >90)
GFR calc non Af Amer: 11 mL/min — ABNORMAL LOW (ref >90)
Glucose, Bld: 75 mg/dL (ref 70–99)
Potassium: 4.5 mEq/L (ref 3.5–5.1)
Total Bilirubin: 0.2 mg/dL — ABNORMAL LOW (ref 0.3–1.2)

## 2011-11-22 LAB — GLUCOSE, CAPILLARY
Glucose-Capillary: 97 mg/dL (ref 70–99)
Glucose-Capillary: 118 mg/dL — ABNORMAL HIGH (ref 70–99)
Glucose-Capillary: 170 mg/dL — ABNORMAL HIGH (ref 70–99)
Glucose-Capillary: 130 mg/dL — ABNORMAL HIGH (ref 70–99)

## 2011-11-22 LAB — CBC
HCT: 28.4 % — ABNORMAL LOW (ref 39.0–52.0)
Hemoglobin: 9.3 g/dL — ABNORMAL LOW (ref 13.0–17.0)
MCHC: 32.7 g/dL (ref 30.0–36.0)

## 2011-11-22 MED ORDER — RENA-VITE PO TABS
1.0000 | ORAL_TABLET | Freq: Every day | ORAL | Status: DC
  Administered 2011-11-22 – 2011-11-28 (×7): 1 via ORAL
  Filled 2011-11-22 (×8): qty 1

## 2011-11-22 NOTE — Progress Notes (Signed)
Subjective: Interval History: has complaints has to leave, cannot stay longer.  Objective: Vital signs in last 24 hours: Temp:  [97.4 F (36.3 C)-98 F (36.7 C)] 97.4 F (36.3 C) (07/09 1000) Pulse Rate:  [66-81] 81  (07/09 1000) Resp:  [12-19] 18  (07/09 1000) BP: (120-137)/(70-98) 128/75 mmHg (07/09 1000) SpO2:  [96 %-100 %] 96 % (07/09 1000) Weight:  [80.7 kg (177 lb 14.6 oz)] 80.7 kg (177 lb 14.6 oz) (07/08 2020) Weight change: 1.683 kg (3 lb 11.4 oz)  Intake/Output from previous day: 07/08 0701 - 07/09 0700 In: 1320 [P.O.:1320] Out: 2053 [Urine:2050; Stool:3] Intake/Output this shift: Total I/O In: 240 [P.O.:240] Out: 325 [Urine:325]  General appearance: alert and aggitated Resp: clear to auscultation bilaterally Chest wall: no tenderness, RIJ cath Cardio: S1, S2 normal and systolic murmur: holosystolic 2/6, blowing at apex GI: pos bs soft, liver down 4 cm Extremities: edema 2 +  Lab Results:  Basename 11/22/11 0630 11/21/11 0527  WBC 7.0 5.8  HGB 9.3* 8.9*  HCT 28.4* 26.5*  PLT 128* 137*   BMET:  Basename 11/22/11 0630 11/21/11 0527  NA 140 139  K 4.5 4.3  CL 105 103  CO2 19 17*  GLUCOSE 75 93  BUN 73* 56*  CREATININE 5.82* 5.21*  CALCIUM 8.4 8.6   No results found for this basename: PTH:2 in the last 72 hours Iron Studies: No results found for this basename: IRON,TIBC,TRANSFERRIN,FERRITIN in the last 72 hours  Studies/Results: No results found.  I have reviewed the patient's current medications.  Assessment/Plan: 1 ARF rate of rise of Cr less but very little GFR.  Discussed.  He is adamant about leaving tomorrow, will see what function is tomorrow.  Vol xs, acidemia better. 2 Anemia  3 Ptlt falling CTX 4 Agitation ? Drug 5 RPGN ANCA pos  P PE in am last one. Check drug screen, follow chem , counts    LOS: 18 days   Arelys Glassco L 11/22/2011,11:03 AM

## 2011-11-23 ENCOUNTER — Inpatient Hospital Stay (HOSPITAL_COMMUNITY): Payer: Medicaid Other

## 2011-11-23 LAB — CBC
HCT: 26.3 % — ABNORMAL LOW (ref 39.0–52.0)
MCH: 30.1 pg (ref 26.0–34.0)
MCHC: 33.1 g/dL (ref 30.0–36.0)
Platelets: 139 10*3/uL — ABNORMAL LOW (ref 150–400)
Platelets: 126 10*3/uL — ABNORMAL LOW (ref 150–400)
RBC: 3.02 MIL/uL — ABNORMAL LOW (ref 4.22–5.81)
RDW: 16.7 % — ABNORMAL HIGH (ref 11.5–15.5)
RDW: 16.7 % — ABNORMAL HIGH (ref 11.5–15.5)
WBC: 6.3 10*3/uL (ref 4.0–10.5)
WBC: 6.4 10*3/uL (ref 4.0–10.5)

## 2011-11-23 LAB — GLUCOSE, CAPILLARY: Glucose-Capillary: 104 mg/dL — ABNORMAL HIGH (ref 70–99)

## 2011-11-23 LAB — RENAL FUNCTION PANEL
Albumin: 3.9 g/dL (ref 3.5–5.2)
Albumin: 4.7 g/dL (ref 3.5–5.2)
CO2: 17 mEq/L — ABNORMAL LOW (ref 19–32)
Calcium: 8.3 mg/dL — ABNORMAL LOW (ref 8.4–10.5)
Calcium: 8 mg/dL — ABNORMAL LOW (ref 8.4–10.5)
Chloride: 107 mEq/L (ref 96–112)
GFR calc Af Amer: 11 mL/min — ABNORMAL LOW (ref >90)
GFR calc Af Amer: 13 mL/min — ABNORMAL LOW (ref >90)
GFR calc non Af Amer: 10 mL/min — ABNORMAL LOW (ref >90)
GFR calc non Af Amer: 11 mL/min — ABNORMAL LOW (ref >90)
Glucose, Bld: 82 mg/dL (ref 70–99)
Phosphorus: 4.8 mg/dL — ABNORMAL HIGH (ref 2.3–4.6)
Potassium: 4.2 mEq/L (ref 3.5–5.1)
Sodium: 140 mEq/L (ref 135–145)
Sodium: 139 mEq/L (ref 135–145)

## 2011-11-23 MED ORDER — SODIUM CHLORIDE 0.9 % IV SOLN
2.0000 g | INTRAVENOUS | Status: DC | PRN
  Administered 2011-11-23: 2 g via INTRAVENOUS
  Filled 2011-11-23 (×2): qty 20

## 2011-11-23 MED ORDER — NEPRO/CARBSTEADY PO LIQD: 237.0000 mL | ORAL | Status: DC | PRN

## 2011-11-23 MED ORDER — LIDOCAINE HCL (PF) 1 % IJ SOLN: 5.0000 mL | INTRAMUSCULAR | Status: DC | PRN

## 2011-11-23 MED ORDER — PENTAFLUOROPROP-TETRAFLUOROETH EX AERO: 1.0000 "application " | INHALATION_SPRAY | CUTANEOUS | Status: DC | PRN

## 2011-11-23 MED ORDER — CALCIUM GLUCONATE 10 % IV SOLN
2.0000 g | Freq: Once | INTRAVENOUS | Status: DC
  Filled 2011-11-23: qty 20

## 2011-11-23 MED ORDER — CALCIUM CARBONATE ANTACID 500 MG PO CHEW
2.0000 | CHEWABLE_TABLET | ORAL | Status: DC | PRN
  Administered 2011-11-23: 400 mg via ORAL
  Filled 2011-11-23: qty 2

## 2011-11-23 MED ORDER — ACETAMINOPHEN 325 MG PO TABS: 650.0000 mg | ORAL_TABLET | ORAL | Status: DC | PRN

## 2011-11-23 MED ORDER — ACD FORMULA A 0.73-2.45-2.2 GM/100ML VI SOLN
500.0000 mL | Status: DC
  Filled 2011-11-23 (×2): qty 500

## 2011-11-23 MED ORDER — SODIUM CHLORIDE 0.9 % IV SOLN
INTRAVENOUS | Status: AC
  Administered 2011-11-23 (×4): via INTRAVENOUS_CENTRAL
  Filled 2011-11-23 (×4): qty 200

## 2011-11-23 MED ORDER — HEPARIN SODIUM (PORCINE) 1000 UNIT/ML DIALYSIS
40.0000 [IU]/kg | Freq: Once | INTRAMUSCULAR | Status: AC
  Administered 2011-11-23: 3200 [IU] via INTRAVENOUS_CENTRAL
  Filled 2011-11-23: qty 4

## 2011-11-23 MED ORDER — ACD FORMULA A 0.73-2.45-2.2 GM/100ML VI SOLN
Status: AC
  Administered 2011-11-23: 500 mL via INTRAVENOUS
  Filled 2011-11-23: qty 500

## 2011-11-23 MED ORDER — HEPARIN SODIUM (PORCINE) 1000 UNIT/ML DIALYSIS
100.0000 [IU]/kg | INTRAMUSCULAR | Status: DC | PRN
  Filled 2011-11-23: qty 9

## 2011-11-23 MED ORDER — ALTEPLASE 2 MG IJ SOLR
2.0000 mg | Freq: Once | INTRAMUSCULAR | Status: AC | PRN
  Filled 2011-11-23: qty 2

## 2011-11-23 MED ORDER — DIPHENHYDRAMINE HCL 25 MG PO CAPS: 25.0000 mg | ORAL_CAPSULE | Freq: Four times a day (QID) | ORAL | Status: DC | PRN

## 2011-11-23 MED ORDER — HEPARIN SODIUM (PORCINE) 1000 UNIT/ML DIALYSIS: 1000.0000 [IU] | INTRAMUSCULAR | Status: DC | PRN

## 2011-11-23 MED ORDER — SODIUM CHLORIDE 0.9 % IV SOLN
4.0000 g | INTRAVENOUS | Status: DC | PRN
  Filled 2011-11-23: qty 40

## 2011-11-23 MED ORDER — SODIUM CHLORIDE 0.9 % IV SOLN: 100.0000 mL | INTRAVENOUS | Status: DC | PRN

## 2011-11-23 MED ORDER — LIDOCAINE-PRILOCAINE 2.5-2.5 % EX CREA: 1.0000 "application " | TOPICAL_CREAM | CUTANEOUS | Status: DC | PRN

## 2011-11-23 MED ORDER — HEPARIN SODIUM (PORCINE) 1000 UNIT/ML IJ SOLN
1000.0000 [IU] | Freq: Once | INTRAMUSCULAR | Status: DC
  Filled 2011-11-23: qty 1

## 2011-11-23 MED ORDER — CALCIUM GLUCONATE 10 % IV SOLN
2.0000 g | INTRAVENOUS | Status: DC | PRN
  Filled 2011-11-23: qty 20

## 2011-11-23 NOTE — Progress Notes (Signed)
Plasma Exchange-See Apheresis flowsheet. Pt tolerated well. Volume 4049ml exchanged without complications. Report given to [facility] on 6700, pt will return to room for lunch and come back for HD treatment. VS stable, pt returned to room on foot with supervision.

## 2011-11-23 NOTE — Procedures (Signed)
I was present at this session.  I have reviewed the session itself and made appropriate changes.  Jeremy Mccoy L 7/10/201310:37 AM

## 2011-11-23 NOTE — Procedures (Signed)
I was present at this session.  I have reviewed the session itself and made appropriate changes.  bp tol vol off Sonnie Pawloski L 7/10/20134:46 PM

## 2011-11-23 NOTE — Progress Notes (Signed)
Subjective: Interval History: none.  Objective: Vital signs in last 24 hours: Temp:  [97.7 F (36.5 C)-98.1 F (36.7 C)] 98 F (36.7 C) (07/10 1013) Pulse Rate:  [72-81] 76  (07/10 1030) Resp:  [16-20] 19  (07/10 1030) BP: (122-138)/(50-82) 123/50 mmHg (07/10 1030) SpO2:  [97 %-100 %] 99 % (07/10 1030) Weight:  [80.7 kg (177 lb 14.6 oz)] 80.7 kg (177 lb 14.6 oz) (07/09 2044) Weight change: 0 kg (0 lb)  Intake/Output from previous day: 07/09 0701 - 07/10 0700 In: 720 [P.O.:720] Out: 1950 [Urine:1950] Intake/Output this shift: Total I/O In: 240 [P.O.:240] Out: 150 [Urine:150]  General appearance: alert, cooperative and pale Resp: clear to auscultation bilaterally Chest wall: no tenderness, IJ cath Cardio: S1, S2 normal and systolic murmur: holosystolic 2/6, blowing at apex GI: soft, pos bs, liver down 4 cm Skin: Skin color, texture, turgor normal. No rashes or lesions acniform rash on chest  Lab Results:  Basename 11/23/11 0610 11/22/11 0630  WBC 6.3 7.0  HGB 8.7* 9.3*  HCT 26.3* 28.4*  PLT 139* 128*   BMET:  Basename 11/23/11 0610 11/22/11 0630  NA 140 140  K 4.2 4.5  CL 107 105  CO2 19 19  GLUCOSE 82 75  BUN 86* 73*  CREATININE 6.29* 5.82*  CALCIUM 8.3* 8.4   No results found for this basename: PTH:2 in the last 72 hours Iron Studies: No results found for this basename: IRON,TIBC,TRANSFERRIN,FERRITIN in the last 72 hours  Studies/Results: No results found.  I have reviewed the patient's current medications.  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  Has a low level of function 2  Anemia 3 Hx substance abuse P PE, HD,     LOS: 19 days   Pedro Oldenburg L 11/23/2011,10:37 AM

## 2011-11-23 NOTE — Progress Notes (Signed)
Utilization review completed.  

## 2011-11-23 NOTE — Progress Notes (Signed)
Nutrition Follow-up  Intervention:    No nutrition intervention warranted at this time RD to follow for nutrition care plan  Assessment:   Patient continues to receive hemodialysis & plasmaphoresis. PO intake 100% per flowsheet records. Plan is for emergency Medicaid and prepare for outpatient HD.   Diet Order:  Renal 80/90-06-17-1198 ml  Meds: Scheduled Meds:   . therapeutic plasma exchange solution   Dialysis Q1 Hr x 4  . anticoagulant sodium citrate  5 mL Intravenous Once  . calcium gluconate  2 g Intravenous Once  . citrate dextrose  500 mL Intravenous QODAY  . darbepoetin (ARANESP) injection - NON-DIALYSIS  150 mcg Subcutaneous Q Sun-1800  . heparin  40 Units/kg Dialysis Once in dialysis  . insulin aspart  0-15 Units Subcutaneous TID WC  . multivitamin  1 tablet Oral QHS  . pantoprazole  40 mg Oral Q1200  . predniSONE  70 mg Oral Q breakfast  . sevelamer  1,600 mg Oral TID WC  . sodium bicarbonate  1,300 mg Oral BID  . sulfamethoxazole-trimethoprim  1 tablet Oral 3 times weekly  . DISCONTD: therapeutic plasma exchange solution   Dialysis Once in dialysis  . DISCONTD: calcium gluconate  2 g Intravenous Once  . DISCONTD: heparin  1,000 Units Intracatheter Once  . DISCONTD: heparin  1,000 Units Intracatheter Once   Continuous Infusions:   . citrate dextrose    . DISCONTD: citrate dextrose     PRN Meds:.sodium chloride, sodium chloride, sodium chloride, sodium chloride, acetaminophen, albuterol, alteplase, calcium carbonate, calcium gluconate IVPB, calcium gluconate IVPB, calcium gluconate, calcium gluconate, diphenhydrAMINE, feeding supplement (NEPRO CARB STEADY), heparin, lidocaine, lidocaine-prilocaine, ondansetron (ZOFRAN) IV, ondansetron, pentafluoroprop-tetrafluoroeth, DISCONTD: acetaminophen, DISCONTD: calcium carbonate DISCONTD: calcium gluconate IVPB, DISCONTD: calcium gluconate IVPB, DISCONTD: diphenhydrAMINE, DISCONTD: feeding supplement (NEPRO CARB STEADY), DISCONTD:  heparin, DISCONTD: heparin, DISCONTD: heparin, DISCONTD: heparin, DISCONTD: lidocaine, DISCONTD: lidocaine-prilocaine, DISCONTD: pentafluoroprop-tetrafluoroeth  Labs:  CMP     Component Value Date/Time   NA 140 11/23/2011 0610   K 4.2 11/23/2011 0610   CL 107 11/23/2011 0610   CO2 19 11/23/2011 0610   GLUCOSE 82 11/23/2011 0610   BUN 86* 11/23/2011 0610   CREATININE 6.29* 11/23/2011 0610   CALCIUM 8.3* 11/23/2011 0610   PROT 5.6* 11/22/2011 0630   ALBUMIN 3.9 11/23/2011 0610   AST 6 11/22/2011 0630   ALT <5 11/22/2011 0630   ALKPHOS 29* 11/22/2011 0630   BILITOT 0.2* 11/22/2011 0630   GFRNONAA 10* 11/23/2011 0610   GFRAA 11* 11/23/2011 0610     Intake/Output Summary (Last 24 hours) at 11/23/11 1151 Last data filed at 11/23/11 0815  Gross per 24 hour  Intake    720 ml  Output   1775 ml  Net  -1055 ml    Phosphorus  Date Value Range Status  11/23/2011 4.8* 2.3 - 4.6 mg/dL Final    CBG (last 3)   Basename 11/23/11 0750 11/22/11 2045 11/22/11 1626  GLUCAP 104* 130* 170*    Weight Status:  80.7 kg (7/9) -- fluctuating due to fluid  Re-estimated needs:  2200-2400 kcals, 90-110 gm protein  Nutrition Dx:  Unintended Weight Loss, ongoing  Goal:  Oral intake to meet >/= 90% of estimated nutrition needs, met  Monitor:  PO intake, weight, labs, I/O's   Lady Deutscher, RD, LDN Pager #: [phone] After-Hours Pager #: [phone]

## 2011-11-24 DIAGNOSIS — N186 End stage renal disease: Secondary | ICD-10-CM

## 2011-11-24 LAB — CBC
HCT: 25.2 % — ABNORMAL LOW (ref 39.0–52.0)
MCH: 30.6 pg (ref 26.0–34.0)
MCHC: 33.7 g/dL (ref 30.0–36.0)
MCV: 90.6 fL (ref 78.0–100.0)
Platelets: 110 10*3/uL — ABNORMAL LOW (ref 150–400)
RDW: 16.9 % — ABNORMAL HIGH (ref 11.5–15.5)
WBC: 5.9 10*3/uL (ref 4.0–10.5)

## 2011-11-24 LAB — RENAL FUNCTION PANEL
Albumin: 3.9 g/dL (ref 3.5–5.2)
BUN: 42 mg/dL — ABNORMAL HIGH (ref 6–23)
Calcium: 8.3 mg/dL — ABNORMAL LOW (ref 8.4–10.5)
Glucose, Bld: 85 mg/dL (ref 70–99)
Phosphorus: 4.6 mg/dL (ref 2.3–4.6)
Potassium: 3.9 mEq/L (ref 3.5–5.1)

## 2011-11-24 LAB — GLUCOSE, CAPILLARY
Glucose-Capillary: 83 mg/dL (ref 70–99)
Glucose-Capillary: 157 mg/dL — ABNORMAL HIGH (ref 70–99)
Glucose-Capillary: 128 mg/dL — ABNORMAL HIGH (ref 70–99)

## 2011-11-24 MED ORDER — HEPARIN SODIUM (PORCINE) 1000 UNIT/ML DIALYSIS
100.0000 [IU]/kg | INTRAMUSCULAR | Status: DC | PRN
  Filled 2011-11-24: qty 9

## 2011-11-24 MED ORDER — ALTEPLASE 2 MG IJ SOLR
2.0000 mg | Freq: Once | INTRAMUSCULAR | Status: AC | PRN
  Filled 2011-11-24: qty 2

## 2011-11-24 MED ORDER — SODIUM CHLORIDE 0.9 % IV SOLN: 100.0000 mL | INTRAVENOUS | Status: DC | PRN

## 2011-11-24 MED ORDER — LIDOCAINE-PRILOCAINE 2.5-2.5 % EX CREA: 1.0000 "application " | TOPICAL_CREAM | CUTANEOUS | Status: DC | PRN

## 2011-11-24 MED ORDER — LIDOCAINE HCL (PF) 1 % IJ SOLN: 5.0000 mL | INTRAMUSCULAR | Status: DC | PRN

## 2011-11-24 MED ORDER — PENTAFLUOROPROP-TETRAFLUOROETH EX AERO: 1.0000 "application " | INHALATION_SPRAY | CUTANEOUS | Status: DC | PRN

## 2011-11-24 MED ORDER — NEPRO/CARBSTEADY PO LIQD: 237.0000 mL | ORAL | Status: DC | PRN

## 2011-11-24 MED ORDER — HEPARIN SODIUM (PORCINE) 1000 UNIT/ML DIALYSIS
1000.0000 [IU] | INTRAMUSCULAR | Status: DC | PRN
  Filled 2011-11-24: qty 1

## 2011-11-24 NOTE — Progress Notes (Signed)
Subjective: Interval History: has complaints needs day pass.  Objective: Vital signs in last 24 hours: Temp:  [97.1 F (36.2 C)-98 F (36.7 C)] 97.8 F (36.6 C) (07/11 1000) Pulse Rate:  [64-78] 70  (07/11 1000) Resp:  [15-20] 18  (07/11 1000) BP: (116-137)/(61-84) 121/72 mmHg (07/11 1000) SpO2:  [96 %-100 %] 97 % (07/11 1000) Weight:  [81.4 kg (179 lb 7.3 oz)-84.7 kg (186 lb 11.7 oz)] 81.557 kg (179 lb 12.8 oz) (07/10 2111) Weight change: 4 kg (8 lb 13.1 oz)  Intake/Output from previous day: 07/10 0701 - 07/11 0700 In: 720 [P.O.:720] Out: 5150 [Urine:1650] Intake/Output this shift:    General appearance: pale Resp: clear to auscultation bilaterally Chest wall: no tenderness, RIJ cath Cardio: S1, S2 normal and systolic murmur: holosystolic 2/6, blowing at apex GI: pos bs soft, liver down 3cm  Lab Results:  [facility] 11/24/11 0605 11/23/11 1100  WBC 5.9 6.4  HGB 8.5* 9.1*  HCT 25.2* 27.5*  PLT 110* 126*   BMET:  Basename 11/24/11 0605 11/23/11 1100  NA 144 139  K 3.9 4.5  CL 108 107  CO2 25 17*  GLUCOSE 85 144*  BUN 42* 80*  CREATININE 3.94* 5.64*  CALCIUM 8.3* 8.0*   No results found for this basename: PTH:2 in the last 72 hours Iron Studies: No results found for this basename: IRON,TIBC,TRANSFERRIN,FERRITIN in the last 72 hours  Studies/Results: No results found.  I have reviewed the patient's current medications.  Assessment/Plan: 1 AKI nonoliguric vol xs, will plan HD in am.  Follow pre HD Cr and exam 2 Anemia  Renal and CTX 3 Low ptlt CTX P HD, pass, follow counts, Pred    LOS: 20 days   Adeli Frost L 11/24/2011,11:44 AM

## 2011-11-24 NOTE — Progress Notes (Signed)
Patient returned from 2 hour day pass at 1453. Assessment performed upon his return to the unit.  Vital signs are WNL, patient is stable, no changes to pre-day pass assessment.

## 2011-11-24 NOTE — Consult Note (Signed)
[facility] CONSULT NOTE 11/24/2011 DOB: [id number] MRN : [id number]  CC: Referring Physician:Dr. Deterding  History of Present Illness: Jeremy Mccoy is an 46 y.o. male long time crack cocaine user, former alcoholic, smoker who has not seen a physician or care provider in the past 6 years and does not have a regular physician reports that he began to feel left rib pain approximately 2 weeks ago. His symptoms progressed to pain in the epigastric area of his abdomen that felt like air trapped in the abdomen and gas. He says the pain then alternated between the left side of the abdomen and the epigastric area for approximately one week. The pain became more severe and the patient developed nausea and dizziness. He reports that the pain and abdominal discomfort became so severe that he had trouble sleeping. He reports increased urination with only a small amount of urine. He reports the development of tingling and numbness in the hands. Approximately 2 days ago he reports that he began to his cough. He reports that he noticed blood in the sputum. He also reports that he has lost approximately 7 pounds over the past week. He reports that 2 days prior to admission he took five 200 mg ibuprofen tablets with no relief in symptoms. The patient reports that he noticed that his urine has been "dirty". The patient says that he's been experiencing shaking cold chills over the past several days. The patient denies subjective fever. The patient reports alternating constipation and loose stools. The patient reports no vomiting but has been severely nauseated. He decided to seek medical care because of the coughing up of blood. He went to the local urgent care center and was evaluated and then sent to the emergency department for further evaluation and management. He emergency department he was noted to have a creatinine greater than 10. He was hyperkalemic with a potassium of 6.0. He had some  evidence of peak T waves on EKG. The patient was clinically dehydrated and pale. The patient reports no history of renal disease or hypertension in his past medical history. He does report that he chronically uses cocaine and has done so for the past 20 years. He smokes one pack of cigarettes per day and has smoked for over 30 years. He quit alcohol several years ago. He lives alone. He reports that he has a heavy work burden and works around toxic chemicals at work including methylene chloride and acetone. 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 Hospitalization was requested for further ongoing treatment.  His creatinine was 10.64 Nephrology consult was called.  He has been hospitalized since 11-04-2011.  He is using a diatek catheter currently for dialysis.     Past Medical History  Diagnosis Date  . COPD (chronic obstructive pulmonary disease)   . Smoker   . Crack cocaine use   . Dental caries     History reviewed. No pertinent past surgical history.   ROS: [x]  Positive  [ ]  Denies    General: [ ]  Weight loss, [ ]  Fever, [ ]  chills Neurologic: [ ]  Dizziness, [ ]  Blackouts, [ ]  Seizure [ ]  Stroke, [ ]  "Mini stroke", [ ]  Slurred speech, [ ]  Temporary blindness; [ ]  weakness in arms or legs, [ ]  Hoarseness Cardiac: [ ]  Chest pain/pressure, [ ]  Shortness of breath at rest [ ]  Shortness of breath with exertion, [ ]  Atrial fibrillation or irregular heartbeat Vascular: [ ]  Pain in legs with walking, [ ]   Pain in legs at rest, [ ]  Pain in legs at night,  [ ]  Non-healing ulcer, [ ]  Blood clot in vein/DVT,   Pulmonary: [ ]  Home oxygen, [ ]  Productive cough, [ ]  Coughing up blood, [ ]  Asthma,  [ ]  Wheezing Musculoskeletal:  [ ]  Arthritis, [ ]  Low back pain, [ ]  Joint pain Hematologic: [ ]  Easy Bruising, [ ]  Anemia; [ ]  Hepatitis Gastrointestinal: [ ]  Blood in stool, [ ]  Gastroesophageal Reflux/heartburn, [ ]  Trouble swallowing Urinary: [ ]  chronic Kidney disease, [ ]  on HD - [ ]  MWF  or [ ]  TTHS, [ ]  Burning with urination, [ ]  Difficulty urinating Skin: [ ]  Rashes, [ ]  Wounds Psychological: [ ]  Anxiety, [ ]  Depression  Social History History  Substance Use Topics  . Smoking status: Current Everyday Smoker -- 1.0 packs/day for 25 years    Types: Cigarettes  . Smokeless tobacco: Never Used  . Alcohol Use: No    Family History Family History  Problem Relation Age of Onset  . Heart attack Father     No Known Allergies  Current Facility-Administered Medications  Medication Dose Route Frequency Provider Last Rate Last Dose  . 0.9 %  sodium chloride infusion  100 mL Intravenous PRN Estanislado Emms, MD      . 0.9 %  sodium chloride infusion  100 mL Intravenous PRN Estanislado Emms, MD      . 0.9 %  sodium chloride infusion  100 mL Intravenous PRN Placido Sou, MD      . 0.9 %  sodium chloride infusion  100 mL Intravenous PRN Placido Sou, MD      . 0.9 %  sodium chloride infusion  100 mL Intravenous PRN Placido Sou, MD      . 0.9 %  sodium chloride infusion  100 mL Intravenous PRN Placido Sou, MD      . acetaminophen (TYLENOL) tablet 650 mg  650 mg Oral Q4H PRN Placido Sou, MD      . albuterol (PROVENTIL) (5 MG/ML) 0.5% nebulizer solution 2.5 mg  2.5 mg Nebulization Q6H PRN Clanford L Johnson, MD      . alteplase (CATHFLO ACTIVASE) injection 2 mg  2 mg Intracatheter Once PRN Placido Sou, MD      . alteplase (CATHFLO ACTIVASE) injection 2 mg  2 mg Intracatheter Once PRN Placido Sou, MD      . anticoagulant sodium citrate solution 5 mL  5 mL Intravenous Once Estanislado Emms, MD      . calcium carbonate (TUMS - dosed in mg elemental calcium) chewable tablet 400 mg of elemental calcium  2 tablet Oral PRN Placido Sou, MD   400 mg of elemental calcium at 11/23/11 1633  . calcium gluconate 2 g in sodium chloride 0.9 % 100 mL IVPB  2 g Intravenous PRN Placido Sou, MD   2 g at 11/23/11 1013   Or  . calcium gluconate 4 g in  sodium chloride 0.9 % 250 mL IVPB  4 g Intravenous PRN Placido Sou, MD       Or  . calcium gluconate inj 10% (1 g) URGENT USE ONLY!  2 g Intravenous PRN Placido Sou, MD      . calcium gluconate inj 10% (1 g) URGENT USE ONLY!  2 g Intravenous PRN Geoffry Paradise, MD      . calcium gluconate inj 10% (1 g) URGENT USE ONLY!  2 g Intravenous Once Placido Sou, MD      . citrate dextrose (ACD-A anticoagulant) solution 500 mL  500 mL Intravenous QODAY Geoffry Paradise, MD   500 mL at 11/23/11 1021  . citrate dextrose (ACD-A anticoagulant) solution 500 mL  500 mL Intravenous Continuous Placido Sou, MD      . darbepoetin (ARANESP) injection 150 mcg  150 mcg Subcutaneous Q Sun-1800 [name], MD   150 mcg at 11/20/11 1803  . diphenhydrAMINE (BENADRYL) capsule 25 mg  25 mg Oral Q6H PRN Placido Sou, MD      . feeding supplement (NEPRO CARB STEADY) liquid 237 mL  237 mL Oral PRN Placido Sou, MD      . feeding supplement (NEPRO CARB STEADY) liquid 237 mL  237 mL Oral PRN Placido Sou, MD      . heparin injection 1,000 Units  1,000 Units Dialysis PRN Placido Sou, MD      . heparin injection 3,200 Units  40 Units/kg Dialysis Once in dialysis Placido Sou, MD   3,200 Units at 11/23/11 1359  . heparin injection 8,100 Units  100 Units/kg Dialysis PRN Placido Sou, MD      . heparin injection 8,200 Units  100 Units/kg Dialysis PRN Placido Sou, MD      . insulin aspart (novoLOG) injection 0-15 Units  0-15 Units Subcutaneous TID WC Reyne Dumas, MD   3 Units at 11/22/11 1706  . lidocaine (XYLOCAINE) 1 % injection 5 mL  5 mL Intradermal PRN Joyice Faster Deterding, MD      . lidocaine (XYLOCAINE) 1 % injection 5 mL  5 mL Intradermal PRN Placido Sou, MD      . lidocaine-prilocaine (EMLA) cream 1 application  1 application Topical PRN Placido Sou, MD      . lidocaine-prilocaine (EMLA) cream 1 application  1 application Topical PRN Placido Sou, MD       . multivitamin (RENA-VIT) tablet 1 tablet  1 tablet Oral QHS Placido Sou, MD   1 tablet at 11/23/11 2211  . ondansetron (ZOFRAN) tablet 4 mg  4 mg Oral Q6H PRN Clanford Marisa Hua, MD       Or  . ondansetron (ZOFRAN) injection 4 mg  4 mg Intravenous Q6H PRN Clanford L Johnson, MD      . pantoprazole (PROTONIX) EC tablet 40 mg  40 mg Oral Q1200 Geoffry Paradise, MD   40 mg at 11/24/11 1238  . pentafluoroprop-tetrafluoroeth (GEBAUERS) aerosol 1 application  1 application Topical PRN Placido Sou, MD      . pentafluoroprop-tetrafluoroeth (GEBAUERS) aerosol 1 application  1 application Topical PRN Placido Sou, MD      . predniSONE (DELTASONE) tablet 70 mg  70 mg Oral Q breakfast Geoffry Paradise, MD   70 mg at 11/24/11 0907  . sevelamer (RENVELA) tablet 1,600 mg  1,600 mg Oral TID WC Geoffry Paradise, MD   1,600 mg at 11/24/11 1237  . sodium bicarbonate tablet 1,300 mg  1,300 mg Oral BID Placido Sou, MD   1,300 mg at 11/24/11 0945  . sulfamethoxazole-trimethoprim (BACTRIM,SEPTRA) 400-80 MG per tablet 1 tablet  1 tablet Oral 3 times weekly Geoffry Paradise, MD   1 tablet at 11/23/11 0943     Imaging: No results found.  Significant Diagnostic Studies: CBC Lab Results  Component Value Date   WBC 5.9 11/24/2011   HGB 8.5* 11/24/2011  HCT 25.2* 11/24/2011   MCV 90.6 11/24/2011   PLT 110* 11/24/2011    BMET    Component Value Date/Time   NA 144 11/24/2011 0605   K 3.9 11/24/2011 0605   CL 108 11/24/2011 0605   CO2 25 11/24/2011 0605   GLUCOSE 85 11/24/2011 0605   BUN 42* 11/24/2011 0605   CREATININE 3.94* 11/24/2011 0605   CALCIUM 8.3* 11/24/2011 0605   GFRNONAA 17* 11/24/2011 0605   GFRAA 20* 11/24/2011 0605    COAG Lab Results  Component Value Date   INR 1.17 11/05/2011   INR 1.14 11/04/2011   Lab Results  Component Value Date   PTT 48* 11/05/2011     Physical Examination BP Readings from Last 3 Encounters:  11/24/11 121/72  11/04/11 124/65   Temp Readings from Last 3  Encounters:  11/24/11 97.8 F (36.6 C) Oral  11/04/11 97.9 F (36.6 C) Oral   SpO2 Readings from Last 3 Encounters:  11/24/11 97%  11/04/11 99%   Pulse Readings from Last 3 Encounters:  11/24/11 70  11/04/11 68    General:  WDWN in NAD Gait: Normal HENT: WNL Eyes: Pupils equal Pulmonary: normal non-labored breathing , without Rales, rhonchi,  wheezing Cardiac: RRR, without  Murmurs, rubs or gallops; No carotid bruits Abdomen: soft, NT, no masses Skin: no rashes, ulcers noted Vascular Exam/Pulses:palpable bilateral radial and ulnar 2+.  He right hand dominant and no IV marks are on the right upper extremity.  Skin is clean and dry.  Extremities without ischemic changes, no Gangrene , no cellulitis; no open wounds;  Musculoskeletal: no muscle wasting or atrophy  Neurologic: A&O X 3; Appropriate Affect ;  SENSATION: normal; MOTOR FUNCTION: Pt has good and equal strength in all extremities - 5/5 Speech is fluent/normal  Non-Invasive Vascular Imaging: Vein mapping Summary:  - The left basilic vein has multiple sites of dilatation in the area of the valves. In these areas, sluggish rouleaux flow is demonstrated. Focal area of thrombosis is seen in the mid upper arm segment of the right basilic vein. - Refer to table below for complete vein mapping observations. Prepared and Electronically Authenticated by     ASSESSMENT/PLAN: CKD  AV fistula creation as an out patient or next week.  We will discuss this with Dr. Trula Slade.   I reviewed the patient's vein mapping. He does not appear to have an adequate left cephalic vein as it could not be visualized but does have an adequate basilic vein. On examination I do see a large vein in the forearm which could potentially be utilized for a radiocephalic fistula. I discussed with the patient that this would have to be a intraoperative decision. I discussed the possibility of a radiocephalic fistula versus a left basilic vein  transposition versus a Gore-Tex graft. We discussed the risk of non-maturity, the risk of needing future interventions, the risk of steal syndrome, the risk of swelling, and the risk of infection. He wishes to get this done as soon as possible. I will try to get this done tomorrow however I told the patient that this may not be possible. I will make him n.p.o. after midnight

## 2011-11-24 NOTE — Progress Notes (Signed)
Patient approved for Day Pass by M.D., for 2 hours to take care of financial matters.  Patient's assessment is complete; Vital signs stable, walks without assistance.  Patient's medications given prior to departure.  Patient verbalized understanding to return to unit by 14:50.

## 2011-11-25 ENCOUNTER — Inpatient Hospital Stay (HOSPITAL_COMMUNITY): Payer: Medicaid Other

## 2011-11-25 ENCOUNTER — Other Ambulatory Visit (HOSPITAL_COMMUNITY): Payer: Self-pay | Admitting: *Deleted

## 2011-11-25 LAB — GLUCOSE, CAPILLARY
Glucose-Capillary: 72 mg/dL (ref 70–99)
Glucose-Capillary: 105 mg/dL — ABNORMAL HIGH (ref 70–99)
Glucose-Capillary: 107 mg/dL — ABNORMAL HIGH (ref 70–99)

## 2011-11-25 LAB — COMPREHENSIVE METABOLIC PANEL
ALT: <5 U/L (ref 0–53)
Albumin: 3.6 g/dL (ref 3.5–5.2)
Alkaline Phosphatase: 36 U/L — ABNORMAL LOW (ref 39–117)
Calcium: 8 mg/dL — ABNORMAL LOW (ref 8.4–10.5)
GFR calc Af Amer: 15 mL/min — ABNORMAL LOW (ref >90)
Glucose, Bld: 87 mg/dL (ref 70–99)
Potassium: 4 mEq/L (ref 3.5–5.1)
Sodium: 142 mEq/L (ref 135–145)
Total Protein: 4.8 g/dL — ABNORMAL LOW (ref 6.0–8.3)

## 2011-11-25 LAB — CBC
HCT: 26.9 % — ABNORMAL LOW (ref 39.0–52.0)
Hemoglobin: 8.8 g/dL — ABNORMAL LOW (ref 13.0–17.0)
MCHC: 32.7 g/dL (ref 30.0–36.0)
MCV: 93.1 fL (ref 78.0–100.0)
WBC: 6.5 10*3/uL (ref 4.0–10.5)

## 2011-11-25 NOTE — Procedures (Signed)
I was present at this session.  I have reviewed the session itself and made appropriate changes.  Clotted mid tx, now back on  Jonella Redditt L 7/12/20139:22 AM

## 2011-11-25 NOTE — Progress Notes (Signed)
Subjective: Interval History: none.  Objective: Vital signs in last 24 hours: Temp:  [97 F (36.1 C)-98.1 F (36.7 C)] 97 F (36.1 C) (07/12 0554) Pulse Rate:  [68-94] 69  (07/12 0852) Resp:  [16-18] 16  (07/12 0600) BP: (108-146)/(68-82) 114/68 mmHg (07/12 0852) SpO2:  [95 %-98 %] 98 % (07/12 0554) Weight:  [82.3 kg (181 lb 7 oz)-83.2 kg (183 lb 6.8 oz)] 83.2 kg (183 lb 6.8 oz) (07/12 0554) Weight change: -2.4 kg (-5 lb 4.7 oz)  Intake/Output from previous day: 07/11 0701 - 07/12 0700 In: 480 [P.O.:480] Out: 901 [Urine:900; Stool:1] Intake/Output this shift:    General appearance: alert, cooperative and appears stated age Resp: clear to auscultation bilaterally Cardio: S1, S2 normal and systolic murmur: holosystolic 2/6, blowing at apex GI: soft,pos bs.liver down 5 cm Extremities: edema 2+  Lab Results:  [facility] 11/25/11 0608 11/24/11 0605  WBC 6.5 5.9  HGB 8.8* 8.5*  HCT 26.9* 25.2*  PLT 128* 110*   BMET:  Basename 11/25/11 0608 11/24/11 0605  NA 142 144  K 4.0 3.9  CL 105 108  CO2 25 25  GLUCOSE 87 85  BUN 63* 42*  CREATININE 4.87* 3.94*  CALCIUM 8.0* 8.3*   No results found for this basename: PTH:2 in the last 72 hours Iron Studies: No results found for this basename: IRON,TIBC,TRANSFERRIN,FERRITIN in the last 72 hours  Studies/Results: No results found.  I have reviewed the patient's current medications.  Assessment/Plan: 1 AKI immunosuppress.  Making more urine. ?pre HD cr lower.  Needs perm access as liklihood of staying away from HD long term is small. 2 Anemia stable. 3 low ptlt stable 4 Vol xs better 5 substance abuse  P HD, follow cr , counts, access    LOS: 21 days   Jeremy Mccoy 11/25/2011,9:15 AM

## 2011-11-25 NOTE — Progress Notes (Signed)
Pt off unit

## 2011-11-26 LAB — GLUCOSE, CAPILLARY
Glucose-Capillary: 114 mg/dL — ABNORMAL HIGH (ref 70–99)
Glucose-Capillary: 125 mg/dL — ABNORMAL HIGH (ref 70–99)

## 2011-11-26 LAB — RENAL FUNCTION PANEL
Albumin: 4.1 g/dL (ref 3.5–5.2)
Chloride: 103 mEq/L (ref 96–112)
GFR calc Af Amer: 16 mL/min — ABNORMAL LOW (ref >90)
Phosphorus: 4.1 mg/dL (ref 2.3–4.6)
Potassium: 4.4 mEq/L (ref 3.5–5.1)
Sodium: 141 mEq/L (ref 135–145)

## 2011-11-26 LAB — CBC
Platelets: 131 10*3/uL — ABNORMAL LOW (ref 150–400)
RBC: 3.18 MIL/uL — ABNORMAL LOW (ref 4.22–5.81)
RDW: 17.6 % — ABNORMAL HIGH (ref 11.5–15.5)
WBC: 6.3 10*3/uL (ref 4.0–10.5)

## 2011-11-26 LAB — DRUG SCREEN PANEL (SERUM)

## 2011-11-26 NOTE — Progress Notes (Signed)
Subjective: Interval History: none.  Objective: Vital signs in last 24 hours: Temp:  [97.7 F (36.5 C)-98 F (36.7 C)] 97.9 F (36.6 C) (07/13 0902) Pulse Rate:  [65-86] 81  (07/13 0902) Resp:  [18-19] 18  (07/13 0902) BP: (120-132)/(70-86) 132/77 mmHg (07/13 0902) SpO2:  [94 %-99 %] 96 % (07/13 0902) Weight:  [83.6 kg (184 lb 4.9 oz)] 83.6 kg (184 lb 4.9 oz) (07/12 2148) Weight change: -2 kg (-4 lb 6.6 oz)  Intake/Output from previous day: 07/12 0701 - 07/13 0700 In: 1200 [P.O.:1200] Out: 4825 [Urine:1175] Intake/Output this shift: Total I/O In: 240 [P.O.:240] Out: -   General appearance: alert, cooperative and appears stated age Resp: clear to auscultation bilaterally Cardio: S1, S2 normal and systolic murmur: holosystolic 2/6, blowing at apex GI: pos bs,soft Extremities: edema 2 +  Lab Results:  [facility] 11/26/11 0730 11/25/11 0608  WBC 6.3 6.5  HGB 9.6* 8.8*  HCT 29.9* 26.9*  PLT 131* 128*   BMET:  Basename 11/26/11 0730 11/25/11 0608  NA 141 142  K 4.4 4.0  CL 103 105  CO2 26 25  GLUCOSE 77 87  BUN 66* 63*  CREATININE 4.75* 4.87*  CALCIUM 8.9 8.0*   No results found for this basename: PTH:2 in the last 72 hours Iron Studies: No results found for this basename: IRON,TIBC,TRANSFERRIN,FERRITIN in the last 72 hours  Studies/Results: No results found.  I have reviewed the patient's current medications.  Assessment/Plan: 1 AKI nonoliguric , vol xs. No function yet, check labs in am 2 Anemia epo 3 Substance abuse P HD, epo check chem    LOS: 22 days   Jeremy Mccoy L 11/26/2011,10:30 AM

## 2011-11-27 LAB — RENAL FUNCTION PANEL
GFR calc Af Amer: 15 mL/min — ABNORMAL LOW (ref >90)
Glucose, Bld: 84 mg/dL (ref 70–99)
Phosphorus: 4.6 mg/dL (ref 2.3–4.6)
Potassium: 4.7 mEq/L (ref 3.5–5.1)
Sodium: 138 mEq/L (ref 135–145)

## 2011-11-27 LAB — GLUCOSE, CAPILLARY
Glucose-Capillary: 135 mg/dL — ABNORMAL HIGH (ref 70–99)
Glucose-Capillary: 143 mg/dL — ABNORMAL HIGH (ref 70–99)

## 2011-11-27 LAB — CBC
Hemoglobin: 9.3 g/dL — ABNORMAL LOW (ref 13.0–17.0)
MCHC: 32.1 g/dL (ref 30.0–36.0)
Platelets: 134 10*3/uL — ABNORMAL LOW (ref 150–400)
RDW: 17.5 % — ABNORMAL HIGH (ref 11.5–15.5)

## 2011-11-27 MED ORDER — LIDOCAINE HCL (PF) 1 % IJ SOLN: 5.0000 mL | INTRAMUSCULAR | Status: DC | PRN

## 2011-11-27 MED ORDER — HEPARIN SODIUM (PORCINE) 1000 UNIT/ML DIALYSIS
100.0000 [IU]/kg | INTRAMUSCULAR | Status: DC | PRN
  Administered 2011-11-28: 8500 [IU] via INTRAVENOUS_CENTRAL
  Filled 2011-11-27: qty 9

## 2011-11-27 MED ORDER — ALTEPLASE 2 MG IJ SOLR
2.0000 mg | Freq: Once | INTRAMUSCULAR | Status: AC | PRN
  Filled 2011-11-27: qty 2

## 2011-11-27 MED ORDER — SODIUM CHLORIDE 0.9 % IV SOLN: 100.0000 mL | INTRAVENOUS | Status: DC | PRN

## 2011-11-27 MED ORDER — HEPARIN SODIUM (PORCINE) 1000 UNIT/ML DIALYSIS
1000.0000 [IU] | INTRAMUSCULAR | Status: DC | PRN
  Filled 2011-11-27: qty 1

## 2011-11-27 MED ORDER — PENTAFLUOROPROP-TETRAFLUOROETH EX AERO: 1.0000 "application " | INHALATION_SPRAY | CUTANEOUS | Status: DC | PRN

## 2011-11-27 MED ORDER — LIDOCAINE-PRILOCAINE 2.5-2.5 % EX CREA: 1.0000 "application " | TOPICAL_CREAM | CUTANEOUS | Status: DC | PRN

## 2011-11-27 MED ORDER — NEPRO/CARBSTEADY PO LIQD: 237.0000 mL | ORAL | Status: DC | PRN

## 2011-11-27 NOTE — Progress Notes (Signed)
Subjective: Interval History: has complaints has cravings.  Objective: Vital signs in last 24 hours: Temp:  [97.5 F (36.4 C)-97.9 F (36.6 C)] 97.6 F (36.4 C) (07/14 0900) Pulse Rate:  [66-80] 70  (07/14 0900) Resp:  [18-20] 18  (07/14 0900) BP: (124-164)/(64-91) 124/64 mmHg (07/14 0900) SpO2:  [95 %-99 %] 97 % (07/14 0900) Weight:  [85 kg (187 lb 6.3 oz)] 85 kg (187 lb 6.3 oz) (07/13 2144) Weight change: 4.7 kg (10 lb 5.8 oz)  Intake/Output from previous day: 07/13 0701 - 07/14 0700 In: 1560 [P.O.:1560] Out: 1750 [Urine:1750] Intake/Output this shift: Total I/O In: 360 [P.O.:360] Out: 150 [Urine:150]  General appearance: alert, cooperative and pale Resp: rales bibasilar Chest wall: no tenderness, R IJ cath Cardio: regular rate and rhythm and systolic murmur: holosystolic 2/6, blowing at apex GI: liver down 4 cm, pos bs Extremities: edema 2+  Lab Results:  Basename 11/27/11 0548 11/26/11 0730  WBC 5.7 6.3  HGB 9.3* 9.6*  HCT 29.0* 29.9*  PLT 134* 131*   BMET:  Basename 11/27/11 0548 11/26/11 0730  NA 138 141  K 4.7 4.4  CL 103 103  CO2 23 26  GLUCOSE 84 77  BUN 80* 66*  CREATININE 5.02* 4.75*  CALCIUM 8.7 8.9   No results found for this basename: PTH:2 in the last 72 hours Iron Studies: No results found for this basename: IRON,TIBC,TRANSFERRIN,FERRITIN in the last 72 hours  Studies/Results: No results found.  I have reviewed the patient's current medications.  Assessment/Plan: 1 AKI little function, for access.  Do hd in am, vol xs 2 Anemia stable 3 substance abuse  P HD, perm access    LOS: 23 days   Jeremy Mccoy L 11/27/2011,11:28 AM

## 2011-11-28 ENCOUNTER — Encounter (HOSPITAL_COMMUNITY): Payer: Self-pay | Admitting: Anesthesiology

## 2011-11-28 ENCOUNTER — Inpatient Hospital Stay (HOSPITAL_COMMUNITY): Payer: Medicaid Other

## 2011-11-28 LAB — CBC
HCT: 28.2 % — ABNORMAL LOW (ref 39.0–52.0)
Hemoglobin: 9.3 g/dL — ABNORMAL LOW (ref 13.0–17.0)
MCH: 30.6 pg (ref 26.0–34.0)
MCHC: 33 g/dL (ref 30.0–36.0)
MCV: 92.8 fL (ref 78.0–100.0)
RDW: 17.5 % — ABNORMAL HIGH (ref 11.5–15.5)

## 2011-11-28 LAB — RENAL FUNCTION PANEL
CO2: 23 mEq/L (ref 19–32)
Calcium: 8.3 mg/dL — ABNORMAL LOW (ref 8.4–10.5)
Creatinine, Ser: 4.9 mg/dL — ABNORMAL HIGH (ref 0.50–1.35)
GFR calc Af Amer: 15 mL/min — ABNORMAL LOW (ref >90)
Glucose, Bld: 84 mg/dL (ref 70–99)
Phosphorus: 5 mg/dL — ABNORMAL HIGH (ref 2.3–4.6)
Sodium: 142 mEq/L (ref 135–145)

## 2011-11-28 MED ORDER — DARBEPOETIN ALFA-POLYSORBATE 150 MCG/0.3ML IJ SOLN
INTRAMUSCULAR | Status: AC
  Administered 2011-11-28: 150 ug via SUBCUTANEOUS
  Filled 2011-11-28: qty 0.3

## 2011-11-28 NOTE — Progress Notes (Signed)
Patient ID: Jeremy Mccoy, male   DOB: [birth year], 46 y.o.   MRN: [id number]  [facility] Progress Note    Subjective:   Pt without complaints   Objective:   BP 140/92  Pulse 71  Temp 97.6 F (36.4 C) (Oral)  Resp 18  Ht 6' (1.829 m)  Wt 81.1 kg (178 lb 12.7 oz)  BMI 24.25 kg/m2  SpO2 98%  Physical Exam: Gen:WD WN WM in NAD CVS:RRR Resp:CTA Abd:+BS, soft NT/ND Ext:no edema  Labs: BMET  Lab 11/28/11 0514 11/27/11 0548 11/26/11 0730 11/25/11 0608 11/24/11 0605 11/23/11 1100 11/23/11 0610  NA 142 138 141 142 144 139 140  K 4.3 4.7 4.4 4.0 3.9 4.5 4.2  CL 106 103 103 105 108 107 107  CO2 23 23 26 25 25  17* 19  GLUCOSE 84 84 77 87 85 144* 82  BUN 83* 80* 66* 63* 42* 80* 86*  CREATININE 4.90* 5.02* 4.75* 4.87* 3.94* 5.64* 6.29*  ALBUMIN 3.4* 3.8 4.1 3.6 3.9 4.7 3.9  CALCIUM 8.3* 8.7 8.9 8.0* 8.3* 8.0* 8.3*  PHOS 5.0* 4.6 4.1 4.5 4.6 3.7 4.8*   CBC  Lab 11/28/11 0516 11/27/11 0548 11/26/11 0730 11/25/11 0608 11/22/11 0630  WBC 5.2 5.7 6.3 6.5 --  NEUTROABS -- -- -- -- 5.4  HGB 9.3* 9.3* 9.6* 8.8* --  HCT 28.2* 29.0* 29.9* 26.9* --  MCV 92.8 93.2 94.0 93.1 --  PLT 124* 134* 131* 128* --    @IMGRELPRIORS @ Medications:      . anticoagulant sodium citrate  5 mL Intravenous Once  . calcium gluconate  2 g Intravenous Once  . citrate dextrose  500 mL Intravenous QODAY  . darbepoetin (ARANESP) injection - NON-DIALYSIS  150 mcg Subcutaneous Q Sun-1800  . insulin aspart  0-15 Units Subcutaneous TID WC  . multivitamin  1 tablet Oral QHS  . pantoprazole  40 mg Oral Q1200  . predniSONE  70 mg Oral Q breakfast  . sevelamer  1,600 mg Oral TID WC  . sulfamethoxazole-trimethoprim  1 tablet Oral 3 times weekly     Assessment/ Plan:   1. 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  +ANCA and complements- treated with cytoxan.  F/u as an outpt.   2. ESRD- scheduled TTS at [facility] 3. Anemia:on epo 4. Vascular access- hopefully AVF in  am 5. Nutrition:stable 6. Hypertension:stable 7. Disposition- hopefully d/c after AVF tomorrow.  Shakera Ebrahimi A 11/28/2011, 10:46 AM

## 2011-11-29 ENCOUNTER — Inpatient Hospital Stay (HOSPITAL_COMMUNITY): Payer: Medicaid Other | Admitting: Anesthesiology

## 2011-11-29 ENCOUNTER — Telehealth: Payer: Self-pay | Admitting: Vascular Surgery

## 2011-11-29 ENCOUNTER — Encounter (HOSPITAL_COMMUNITY)
Admission: EM | Disposition: A | Discharge status: Home / Self Care | Payer: Self-pay | Source: Home / Self Care | Attending: Nephrology

## 2011-11-29 ENCOUNTER — Encounter (HOSPITAL_COMMUNITY): Payer: Self-pay | Admitting: Anesthesiology

## 2011-11-29 ENCOUNTER — Other Ambulatory Visit: Payer: Self-pay | Admitting: *Deleted

## 2011-11-29 DIAGNOSIS — N186 End stage renal disease: Secondary | ICD-10-CM

## 2011-11-29 DIAGNOSIS — Z4931 Encounter for adequacy testing for hemodialysis: Secondary | ICD-10-CM

## 2011-11-29 HISTORY — PX: AV FISTULA PLACEMENT: SHX1204

## 2011-11-29 LAB — GLUCOSE, CAPILLARY
Glucose-Capillary: 134 mg/dL — ABNORMAL HIGH (ref 70–99)
Glucose-Capillary: 76 mg/dL (ref 70–99)

## 2011-11-29 LAB — SURGICAL PCR SCREEN
MRSA, PCR: NEGATIVE
Staphylococcus aureus: NEGATIVE

## 2011-11-29 SURGERY — ARTERIOVENOUS (AV) FISTULA CREATION
Anesthesia: Monitor Anesthesia Care | Site: Arm Lower | Laterality: Left | Wound class: Clean

## 2011-11-29 MED ORDER — LIDOCAINE-EPINEPHRINE (PF) 1 %-1:200000 IJ SOLN
INTRAMUSCULAR | Status: AC
  Filled 2011-11-29: qty 10

## 2011-11-29 MED ORDER — 0.9 % SODIUM CHLORIDE (POUR BTL) OPTIME
TOPICAL | Status: DC | PRN
  Administered 2011-11-29: 1000 mL

## 2011-11-29 MED ORDER — ONDANSETRON HCL 4 MG/2ML IJ SOLN
INTRAMUSCULAR | Status: DC | PRN
  Administered 2011-11-29: 4 mg via INTRAVENOUS

## 2011-11-29 MED ORDER — PANTOPRAZOLE SODIUM 40 MG PO TBEC: 40.0000 mg | DELAYED_RELEASE_TABLET | Freq: Every day | ORAL | Status: DC

## 2011-11-29 MED ORDER — CALCIUM CARBONATE ANTACID 500 MG PO CHEW: 2.0000 | CHEWABLE_TABLET | ORAL | Status: DC | PRN

## 2011-11-29 MED ORDER — MIDAZOLAM HCL 5 MG/5ML IJ SOLN
INTRAMUSCULAR | Status: DC | PRN
  Administered 2011-11-29: 2 mg via INTRAVENOUS

## 2011-11-29 MED ORDER — PREDNISONE 20 MG PO TABS: 60.0000 mg | ORAL_TABLET | Freq: Every day | ORAL | Status: AC

## 2011-11-29 MED ORDER — HYDROMORPHONE HCL PF 1 MG/ML IJ SOLN: 0.2500 mg | INTRAMUSCULAR | Status: DC | PRN

## 2011-11-29 MED ORDER — SODIUM CHLORIDE 0.9 % IR SOLN
Status: DC | PRN
  Administered 2011-11-29: 08:00:00

## 2011-11-29 MED ORDER — EPHEDRINE SULFATE 50 MG/ML IJ SOLN
INTRAMUSCULAR | Status: DC | PRN
  Administered 2011-11-29: 5 mg via INTRAVENOUS
  Administered 2011-11-29: 10 mg via INTRAVENOUS

## 2011-11-29 MED ORDER — CEFAZOLIN SODIUM 1-5 GM-% IV SOLN
INTRAVENOUS | Status: DC | PRN
  Administered 2011-11-29: 2 g via INTRAVENOUS

## 2011-11-29 MED ORDER — FENTANYL CITRATE 0.05 MG/ML IJ SOLN
INTRAMUSCULAR | Status: DC | PRN
  Administered 2011-11-29: 150 ug via INTRAVENOUS
  Administered 2011-11-29 (×2): 25 ug via INTRAVENOUS

## 2011-11-29 MED ORDER — PREDNISONE 50 MG PO TABS
60.0000 mg | ORAL_TABLET | Freq: Every day | ORAL | Status: DC
  Filled 2011-11-29: qty 1

## 2011-11-29 MED ORDER — DROPERIDOL 2.5 MG/ML IJ SOLN
0.6250 mg | INTRAMUSCULAR | Status: DC | PRN
  Filled 2011-11-29: qty 0.25

## 2011-11-29 MED ORDER — PROPOFOL 10 MG/ML IV EMUL
INTRAVENOUS | Status: DC | PRN
  Administered 2011-11-29: 220 mg via INTRAVENOUS

## 2011-11-29 MED ORDER — CEFAZOLIN SODIUM-DEXTROSE 2-3 GM-% IV SOLR
INTRAVENOUS | Status: AC
  Filled 2011-11-29: qty 50

## 2011-11-29 MED ORDER — LIDOCAINE HCL (PF) 1 % IJ SOLN
INTRAMUSCULAR | Status: DC | PRN
  Administered 2011-11-29: 30 mL via INTRADERMAL

## 2011-11-29 MED ORDER — SULFAMETHOXAZOLE-TRIMETHOPRIM 400-80 MG PO TABS: 1.0000 | ORAL_TABLET | ORAL | Status: AC

## 2011-11-29 MED ORDER — RENA-VITE PO TABS: 1.0000 | ORAL_TABLET | Freq: Every day | ORAL | Status: DC

## 2011-11-29 MED ORDER — LIDOCAINE HCL (CARDIAC) 20 MG/ML IV SOLN
INTRAVENOUS | Status: DC | PRN
  Administered 2011-11-29: 100 mg via INTRAVENOUS

## 2011-11-29 MED ORDER — ACETAMINOPHEN 325 MG PO TABS: 650.0000 mg | ORAL_TABLET | Freq: Four times a day (QID) | ORAL | Status: DC | PRN

## 2011-11-29 MED ORDER — SODIUM CHLORIDE 0.9 % IV SOLN
INTRAVENOUS | Status: DC | PRN
  Administered 2011-11-29 (×2): via INTRAVENOUS

## 2011-11-29 MED ORDER — SEVELAMER CARBONATE 800 MG PO TABS: 1600.0000 mg | ORAL_TABLET | Freq: Three times a day (TID) | ORAL | Status: DC

## 2011-11-29 MED ORDER — OXYCODONE-ACETAMINOPHEN 5-325 MG PO TABS: 1.0000 | ORAL_TABLET | ORAL | Status: DC | PRN

## 2011-11-29 MED ORDER — HEPARIN SODIUM (PORCINE) 1000 UNIT/ML IJ SOLN
INTRAMUSCULAR | Status: DC | PRN
  Administered 2011-11-29: 6000 [IU] via INTRAVENOUS

## 2011-11-29 SURGICAL SUPPLY — 37 items
ADH SKN CLS APL DERMABOND .7 (GAUZE/BANDAGES/DRESSINGS) ×1
CANISTER SUCTION 2500CC (MISCELLANEOUS) ×2 IMPLANT
CLIP TI MEDIUM 6 (CLIP) ×2 IMPLANT
CLIP TI WIDE RED SMALL 6 (CLIP) ×4 IMPLANT
CLOTH BEACON ORANGE TIMEOUT ST (SAFETY) ×2 IMPLANT
COVER PROBE W GEL 5X96 (DRAPES) ×2 IMPLANT
COVER SURGICAL LIGHT HANDLE (MISCELLANEOUS) ×4 IMPLANT
DECANTER SPIKE VIAL GLASS SM (MISCELLANEOUS) ×2 IMPLANT
DERMABOND ADVANCED (GAUZE/BANDAGES/DRESSINGS) ×1
DERMABOND ADVANCED .7 DNX12 (GAUZE/BANDAGES/DRESSINGS) ×1 IMPLANT
DRAIN PENROSE 1/2X12 LTX STRL (WOUND CARE) IMPLANT
ELECT REM PT RETURN 9FT ADLT (ELECTROSURGICAL) ×2
ELECTRODE REM PT RTRN 9FT ADLT (ELECTROSURGICAL) ×1 IMPLANT
GLOVE BIO SURGEON STRL SZ 6.5 (GLOVE) ×1 IMPLANT
GLOVE BIO SURGEON STRL SZ7.5 (GLOVE) ×3 IMPLANT
GLOVE BIOGEL PI IND STRL 7.0 (GLOVE) IMPLANT
GLOVE BIOGEL PI IND STRL 7.5 (GLOVE) ×1 IMPLANT
GLOVE BIOGEL PI INDICATOR 7.0 (GLOVE) ×2
GLOVE BIOGEL PI INDICATOR 7.5 (GLOVE) ×1
GLOVE SURG SS PI 7.5 STRL IVOR (GLOVE) ×1 IMPLANT
GOWN PREVENTION PLUS XLARGE (GOWN DISPOSABLE) ×1 IMPLANT
GOWN STRL NON-REIN LRG LVL3 (GOWN DISPOSABLE) ×4 IMPLANT
KIT BASIN OR (CUSTOM PROCEDURE TRAY) ×2 IMPLANT
KIT ROOM TURNOVER OR (KITS) ×2 IMPLANT
NS IRRIG 1000ML POUR BTL (IV SOLUTION) ×2 IMPLANT
PACK CV ACCESS (CUSTOM PROCEDURE TRAY) ×2 IMPLANT
PAD ARMBOARD 7.5X6 YLW CONV (MISCELLANEOUS) ×4 IMPLANT
SPONGE GAUZE 4X4 12PLY (GAUZE/BANDAGES/DRESSINGS) ×2 IMPLANT
SPONGE SURGIFOAM ABS GEL 100 (HEMOSTASIS) IMPLANT
SUT PROLENE 6 0 BV (SUTURE) ×2 IMPLANT
SUT VIC AB 3-0 SH 27 (SUTURE) ×2
SUT VIC AB 3-0 SH 27X BRD (SUTURE) ×1 IMPLANT
SUT VICRYL 4-0 PS2 18IN ABS (SUTURE) ×2 IMPLANT
TOWEL OR 17X24 6PK STRL BLUE (TOWEL DISPOSABLE) ×2 IMPLANT
TOWEL OR 17X26 10 PK STRL BLUE (TOWEL DISPOSABLE) ×2 IMPLANT
UNDERPAD 30X30 INCONTINENT (UNDERPADS AND DIAPERS) ×2 IMPLANT
WATER STERILE IRR 1000ML POUR (IV SOLUTION) ×2 IMPLANT

## 2011-11-29 NOTE — H&P (Signed)
CC:  Referring Physician:Dr. Deterding  History of Present Illness: Jeremy Mccoy is an 46 y.o. male long time crack cocaine user, former alcoholic, smoker who has not seen a physician or care provider in the past 6 years and does not have a regular physician reports that he began to feel left rib pain approximately 2 weeks ago. His symptoms progressed to pain in the epigastric area of his abdomen that felt like air trapped in the abdomen and gas. He says the pain then alternated between the left side of the abdomen and the epigastric area for approximately one week. The pain became more severe and the patient developed nausea and dizziness. He reports that the pain and abdominal discomfort became so severe that he had trouble sleeping. He reports increased urination with only a small amount of urine. He reports the development of tingling and numbness in the hands. Approximately 2 days ago he reports that he began to his cough. He reports that he noticed blood in the sputum. He also reports that he has lost approximately 7 pounds over the past week. He reports that 2 days prior to admission he took five 200 mg ibuprofen tablets with no relief in symptoms. The patient reports that he noticed that his urine has been "dirty". The patient says that he's been experiencing shaking cold chills over the past several days. The patient denies subjective fever. The patient reports alternating constipation and loose stools. The patient reports no vomiting but has been severely nauseated. He decided to seek medical care because of the coughing up of blood. He went to the local urgent care center and was evaluated and then sent to the emergency department for further evaluation and management. He emergency department he was noted to have a creatinine greater than 10. He was hyperkalemic with a potassium of 6.0. He had some evidence of peak T waves on EKG. The patient was clinically dehydrated and pale. The patient reports  no history of renal disease or hypertension in his past medical history. He does report that he chronically uses cocaine and has done so for the past 20 years. He smokes one pack of cigarettes per day and has smoked for over 30 years. He quit alcohol several years ago. He lives alone. He reports that he has a heavy work burden and works around toxic chemicals at work including methylene chloride and acetone. 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 Hospitalization was requested for further ongoing treatment.  His creatinine was 10.64 Nephrology consult was called. He has been hospitalized since 11-04-2011. He is using a diatek catheter currently for dialysis.  Past Medical History   Diagnosis  Date   .  COPD (chronic obstructive pulmonary disease)    .  Smoker    .  Crack cocaine use    .  Dental caries     History reviewed. No pertinent past surgical history.  ROS: [x]  Positive [ ]  Denies  General: [ ]  Weight loss, [ ]  Fever, [ ]  chills  Neurologic: [ ]  Dizziness, [ ]  Blackouts, [ ]  Seizure  [ ]  Stroke, [ ]  "Mini stroke", [ ]  Slurred speech, [ ]  Temporary blindness; [ ]  weakness in arms or legs, [ ]  Hoarseness  Cardiac: [ ]  Chest pain/pressure, [ ]  Shortness of breath at rest [ ]  Shortness of breath with exertion, [ ]  Atrial fibrillation or irregular heartbeat  Vascular: [ ]  Pain in legs with walking, [ ]  Pain in legs at rest, [ ]  Pain  in legs at night,  [ ]  Non-healing ulcer, [ ]  Blood clot in vein/DVT,  Pulmonary: [ ]  Home oxygen, [ ]  Productive cough, [ ]  Coughing up blood, [ ]  Asthma,  [ ]  Wheezing  Musculoskeletal: [ ]  Arthritis, [ ]  Low back pain, [ ]  Joint pain  Hematologic: [ ]  Easy Bruising, [ ]  Anemia; [ ]  Hepatitis  Gastrointestinal: [ ]  Blood in stool, [ ]  Gastroesophageal Reflux/heartburn, [ ]  Trouble swallowing  Urinary: [ ]  chronic Kidney disease, [ ]  on HD - [ ]  MWF or [ ]  TTHS, [ ]  Burning with urination, [ ]  Difficulty urinating  Skin: [ ]  Rashes, [ ]  Wounds    Psychological: [ ]  Anxiety, [ ]  Depression  Social History  History   Substance Use Topics   .  Smoking status:  Current Everyday Smoker -- 1.0 packs/day for 25 years     Types:  Cigarettes   .  Smokeless tobacco:  Never Used   .  Alcohol Use:  No    Family History  Family History   Problem  Relation  Age of Onset   .  Heart attack  Father     No Known Allergies  Current Facility-Administered Medications   Medication  Dose  Route  Frequency  Provider  Last Rate  Last Dose   .  0.9 % sodium chloride infusion  100 mL  Intravenous  PRN  Estanislado Emms, MD     .  0.9 % sodium chloride infusion  100 mL  Intravenous  PRN  Estanislado Emms, MD     .  0.9 % sodium chloride infusion  100 mL  Intravenous  PRN  Placido Sou, MD     .  0.9 % sodium chloride infusion  100 mL  Intravenous  PRN  Placido Sou, MD     .  0.9 % sodium chloride infusion  100 mL  Intravenous  PRN  Placido Sou, MD     .  0.9 % sodium chloride infusion  100 mL  Intravenous  PRN  Placido Sou, MD     .  acetaminophen (TYLENOL) tablet 650 mg  650 mg  Oral  Q4H PRN  Placido Sou, MD     .  albuterol (PROVENTIL) (5 MG/ML) 0.5% nebulizer solution 2.5 mg  2.5 mg  Nebulization  Q6H PRN  Clanford L Johnson, MD     .  alteplase (CATHFLO ACTIVASE) injection 2 mg  2 mg  Intracatheter  Once PRN  Placido Sou, MD     .  alteplase (CATHFLO ACTIVASE) injection 2 mg  2 mg  Intracatheter  Once PRN  Placido Sou, MD     .  anticoagulant sodium citrate solution 5 mL  5 mL  Intravenous  Once  Estanislado Emms, MD     .  calcium carbonate (TUMS - dosed in mg elemental calcium) chewable tablet 400 mg of elemental calcium  2 tablet  Oral  PRN  Placido Sou, MD   400 mg of elemental calcium at 11/23/11 1633   .  calcium gluconate 2 g in sodium chloride 0.9 % 100 mL IVPB  2 g  Intravenous  PRN  Placido Sou, MD   2 g at 11/23/11 1013    Or   .  calcium gluconate 4 g in sodium chloride 0.9 % 250 mL IVPB  4  g  Intravenous  PRN  Placido Sou,  MD      Or   .  calcium gluconate inj 10% (1 g) URGENT USE ONLY!  2 g  Intravenous  PRN  Placido Sou, MD     .  calcium gluconate inj 10% (1 g) URGENT USE ONLY!  2 g  Intravenous  PRN  Geoffry Paradise, MD     .  calcium gluconate inj 10% (1 g) URGENT USE ONLY!  2 g  Intravenous  Once  Placido Sou, MD     .  citrate dextrose (ACD-A anticoagulant) solution 500 mL  500 mL  Intravenous  QODAY  Geoffry Paradise, MD   500 mL at 11/23/11 1021   .  citrate dextrose (ACD-A anticoagulant) solution 500 mL  500 mL  Intravenous  Continuous  Placido Sou, MD     .  darbepoetin (ARANESP) injection 150 mcg  150 mcg  Subcutaneous  Q Sun-1800  [name], MD   150 mcg at 11/20/11 1803   .  diphenhydrAMINE (BENADRYL) capsule 25 mg  25 mg  Oral  Q6H PRN  Placido Sou, MD     .  feeding supplement (NEPRO CARB STEADY) liquid 237 mL  237 mL  Oral  PRN  Placido Sou, MD     .  feeding supplement (NEPRO CARB STEADY) liquid 237 mL  237 mL  Oral  PRN  Placido Sou, MD     .  heparin injection 1,000 Units  1,000 Units  Dialysis  PRN  Placido Sou, MD     .  heparin injection 3,200 Units  40 Units/kg  Dialysis  Once in dialysis  Placido Sou, MD   3,200 Units at 11/23/11 1359   .  heparin injection 8,100 Units  100 Units/kg  Dialysis  PRN  Placido Sou, MD     .  heparin injection 8,200 Units  100 Units/kg  Dialysis  PRN  Placido Sou, MD     .  insulin aspart (novoLOG) injection 0-15 Units  0-15 Units  Subcutaneous  TID WC  Reyne Dumas, MD   3 Units at 11/22/11 1706   .  lidocaine (XYLOCAINE) 1 % injection 5 mL  5 mL  Intradermal  PRN  Joyice Faster Deterding, MD     .  lidocaine (XYLOCAINE) 1 % injection 5 mL  5 mL  Intradermal  PRN  Placido Sou, MD     .  lidocaine-prilocaine (EMLA) cream 1 application  1 application  Topical  PRN  Placido Sou, MD     .  lidocaine-prilocaine (EMLA) cream 1 application  1 application  Topical   PRN  Placido Sou, MD     .  multivitamin (RENA-VIT) tablet 1 tablet  1 tablet  Oral  QHS  Placido Sou, MD   1 tablet at 11/23/11 2211   .  ondansetron (ZOFRAN) tablet 4 mg  4 mg  Oral  Q6H PRN  Clanford Marisa Hua, MD      Or   .  ondansetron (ZOFRAN) injection 4 mg  4 mg  Intravenous  Q6H PRN  Clanford L Johnson, MD     .  pantoprazole (PROTONIX) EC tablet 40 mg  40 mg  Oral  Q1200  Geoffry Paradise, MD   40 mg at 11/24/11 1238   .  pentafluoroprop-tetrafluoroeth (GEBAUERS) aerosol 1 application  1 application  Topical  PRN  Placido Sou, MD     .  pentafluoroprop-tetrafluoroeth (GEBAUERS) aerosol 1 application  1 application  Topical  PRN  Placido Sou, MD     .  predniSONE (DELTASONE) tablet 70 mg  70 mg  Oral  Q breakfast  Geoffry Paradise, MD   70 mg at 11/24/11 0907   .  sevelamer (RENVELA) tablet 1,600 mg  1,600 mg  Oral  TID WC  Geoffry Paradise, MD   1,600 mg at 11/24/11 1237   .  sodium bicarbonate tablet 1,300 mg  1,300 mg  Oral  BID  Placido Sou, MD   1,300 mg at 11/24/11 0945   .  sulfamethoxazole-trimethoprim (BACTRIM,SEPTRA) 400-80 MG per tablet 1 tablet  1 tablet  Oral  3 times weekly  Geoffry Paradise, MD   1 tablet at 11/23/11 0943    Imaging:  No results found.  Significant Diagnostic Studies:  CBC  Lab Results   Component  Value  Date    WBC  5.9  11/24/2011    HGB  8.5*  11/24/2011    HCT  25.2*  11/24/2011    MCV  90.6  11/24/2011    PLT  110*  11/24/2011    BMET    Component  Value  Date/Time    NA  144  11/24/2011 0605    K  3.9  11/24/2011 0605    CL  108  11/24/2011 0605    CO2  25  11/24/2011 0605    GLUCOSE  85  11/24/2011 0605    BUN  42*  11/24/2011 0605    CREATININE  3.94*  11/24/2011 0605    CALCIUM  8.3*  11/24/2011 0605    GFRNONAA  17*  11/24/2011 0605    GFRAA  20*  11/24/2011 0605    COAG  Lab Results   Component  Value  Date    INR  1.17  11/05/2011    INR  1.14  11/04/2011    Lab Results   Component  Value  Date    PTT  48*   11/05/2011    Physical Examination  BP Readings from Last 3 Encounters:   11/24/11  121/72   11/04/11  124/65    Temp Readings from Last 3 Encounters:   11/24/11  97.8 F (36.6 C) Oral   11/04/11  97.9 F (36.6 C) Oral    SpO2 Readings from Last 3 Encounters:   11/24/11  97%   11/04/11  99%    Pulse Readings from Last 3 Encounters:   11/24/11  70   11/04/11  68    General: WDWN in NAD  Gait: Normal  HENT: WNL  Eyes: Pupils equal  Pulmonary: normal non-labored breathing , without Rales, rhonchi, wheezing  Cardiac: RRR, without Murmurs, rubs or gallops;  No carotid bruits  Abdomen: soft, NT, no masses  Skin: no rashes, ulcers noted  Vascular Exam/Pulses:palpable bilateral radial and ulnar 2+. He right hand dominant and no IV marks are on the right upper extremity. Skin is clean and dry.  Extremities without ischemic changes, no Gangrene , no cellulitis; no open wounds;  Musculoskeletal: no muscle wasting or atrophy  Neurologic: A&O X 3; Appropriate Affect ;  SENSATION: normal;  MOTOR FUNCTION: Pt has good and equal strength in all extremities - 5/5  Speech is fluent/normal  Non-Invasive Vascular Imaging:  Vein mapping  Summary:  - The left basilic vein has multiple sites of dilatation in the area of the valves. In these areas, sluggish rouleaux  flow is demonstrated. Focal area of thrombosis is seen in the mid upper arm segment of the right basilic vein. - Refer to table below for complete vein mapping observations. Prepared and Electronically Authenticated by   ASSESSMENT/PLAN:  CKD  AV fistula creation as an out patient or next week. We will discuss this with Dr. Trula Slade.  I reviewed the patient's vein mapping. He does not appear to have an adequate left cephalic vein as it could not be visualized but does have an adequate basilic vein. On examination I do see a large vein in the forearm which could potentially be utilized for a radiocephalic fistula. I discussed  with the patient that this would have to be a intraoperative decision. I discussed the possibility of a radiocephalic fistula versus a left basilic vein transposition versus a Gore-Tex graft. We discussed the risk of non-maturity, the risk of needing future interventions, the risk of steal syndrome, the risk of swelling, and the risk of infection. He wishes to get this done as soon as possible. I will try to get this done tomorrow however I told the patient that this may not be possible. I will make him n.p.o. after midnight

## 2011-11-29 NOTE — Progress Notes (Signed)
   CARE MANAGEMENT NOTE 11/29/2011  Patient:  Jeremy Mccoy, Jeremy Mccoy   Account Number:  [id number]  Date Initiated:  11/07/2011  Documentation initiated by:  Lars Pinks  Subjective/Objective Assessment:   PT WAS ADMITTED WITH N/V     Action/Plan:   PROGRESSION OF CARE AND DISCHARGE PLANNING   Anticipated DC Date:  11/11/2011   Anticipated DC Plan:  HOME/SELF CARE  In-house referral  Clinical Social Worker      DC Planning Services  CM consult      Choice offered to / List presented to:             Status of service:  In process, will continue to follow Medicare Important Message given?   (If response is "NO", the following Medicare IM given date fields will be blank) Date Medicare IM given:   Date Additional Medicare IM given:    Discharge Disposition:    Per UR Regulation:  Reviewed for med. necessity/level of care/duration of stay  If discussed at [place] of Stay Meetings, dates discussed:    Comments:  11/29/2011 [place], [place] Patient to surgery for AVF today. Outpatient dialysis at [facility] Met with patient to discuss discharge planning, [facility], has transportation for medical follow up. Discharge to home today.  11/10/2011  [place], [place] Patient not in room for CM follow up. Call to [employer] left message for return call regarding status.  11/07/11 Lars Pinks, RN, BSN 1528 PT WAS ADMITTED WITH N/V AND IS LOOKING TO HAVING ? HD ONE DAY, PT WAS HAVING A RENAL BIOPSY TODAY.   PTA PT WAS LIVING WITH HIS PARENTS AND IS EMPLOYED.  WILL F/U ON HIS DC NEEDS.  CSW INFORMED OF HIM NOT HAVING ANY INSURANCE AND MAY NEED HD IN THE FUTURE.  FINANCIAL COORDINATOR HAS ALREADY REACHED PT IN HIS RM TO TALK ABOUT WAYS TO PAY HIS BILL.

## 2011-11-29 NOTE — Anesthesia Procedure Notes (Signed)
Procedure Name: LMA Insertion Date/Time: 11/29/2011 7:49 AM Performed by: Carter Kitten Pre-anesthesia Checklist: Patient identified, Timeout performed, Emergency Drugs available, Suction available and Patient being monitored Patient Re-evaluated:Patient Re-evaluated prior to inductionOxygen Delivery Method: Circle system utilized Preoxygenation: Pre-oxygenation with 100% oxygen Intubation Type: IV induction LMA: LMA inserted LMA Size: 5.0 Number of attempts: 1 Placement Confirmation: positive ETCO2 and breath sounds checked- equal and bilateral Tube secured with: Tape Dental Injury: Teeth and Oropharynx as per pre-operative assessment

## 2011-11-29 NOTE — Telephone Encounter (Addendum)
Message copied by Doristine Section on Tue Nov 29, 2011  1:08 PM ------      Message from: Mena Goes      Created: Tue Nov 29, 2011 10:33 AM      Regarding: schedule                   ----- Message -----         From: Alfonso Patten, RN         Sent: 11/29/2011  10:13 AM           To: Mena Goes, CMA, [facility]      Subject: FW: charge and follow up                                             ----- Message -----         From: Angelia Mould, MD         Sent: 11/29/2011   9:09 AM           To: Patrici Ranks, Alfonso Patten, RN      Subject: charge and follow up                                     PROCEDURE: left radiocephalic AV fistula            SURGEON: Judeth Cornfield. Scot Dock, MD, FACS            ASSIST: Wray Kearns PA            He will need a follow up visit in 6 weeks to check on the maturation of this fistula. He should have a duplex scan of his fistula at that time. Thank you. CSD  notified pt. of fu appt on 01-11-12 at 2:30 for duplex and fu with dr. Scot Dock

## 2011-11-29 NOTE — Preoperative (Signed)
Beta Blockers   Reason not to administer Beta Blockers:Not Applicable. No home beta blockers 

## 2011-11-29 NOTE — Progress Notes (Signed)
Utilization review completed.  

## 2011-11-29 NOTE — Progress Notes (Signed)
Medicaid application sent to Social Services 11/18/2011 per [place] Counselor Medicaid status pending. [facility] and unable to process thru Medicaid without a number, medicaid pending. [facility] $11.61, Bactrim $8.81 and Protonix $55.11, Renvela $600.   Call pharmacy for [place] is able to fill 4 days of Renvela.  She will call patient when ready Per charge nurse script for Renvela sent to pharmacy and per MD can get OTC omeprazole for Protonix. [facility] has Renvela. Call to patient regarding the meds [facility], prednisone, bactrim and omeprazole for the Protonix. The pharmacy at [facility] to provide 4 days of Renvela, the pharmacist will call when ready.

## 2011-11-29 NOTE — Transfer of Care (Signed)
Immediate Anesthesia Transfer of Care Note  Patient: Jeremy Mccoy  Procedure(s) Performed: Procedure(s) (LRB): ARTERIOVENOUS (AV) FISTULA CREATION (Left)  Patient Location: PACU  Anesthesia Type: General  Level of Consciousness: awake, alert  and oriented  Airway & Oxygen Therapy: Patient Spontanous Breathing and Patient connected to nasal cannula oxygen  Post-op Assessment: Report given to PACU RN and Post -op Vital signs reviewed and stable  Post vital signs: Reviewed  Complications: No apparent anesthesia complications

## 2011-11-29 NOTE — Op Note (Signed)
NAME: Jeremy Mccoy   MRN: [id number] DOB: 1966-03-14    DATE OF OPERATION: 11/29/2011  PREOP DIAGNOSIS: chronic kidney disease  POSTOP DIAGNOSIS: same  PROCEDURE: left radiocephalic AV fistula  SURGEON: Judeth Cornfield. Scot Dock, MD, FACS  ASSIST: Wray Kearns PA  ANESTHESIA: Gen.   EBL: minimal  INDICATIONS: Jeremy Mccoy is a 46 y.o. male I was asked to place an access on hemodialysis.  FINDINGS: 4 mm cephalic vein.  TECHNIQUE: The patient was brought to the operating room and received a general anesthetic. The left upper extremity was prepped and draped in the usual sterile fashion. An oblique incision was made at the left wrist and the cephalic vein was dissected free. I preserved a small branch distally. The vein was ligated distally and then the small branch was connected with the main branch for a widely spatulated anastomosis. The valve at this level was sharply excised. The radial artery was dissected free beneath the fascia. The artery was clamped proximally and distally and a longitudinal arteriotomy was made. The vein was sewn end to side to the artery using continuous 6-0 Prolene suture. Prior to completing the anastomosis I passed a 2-1/2 mm dilator through both ends of the anastomosis. The anastomosis was completed. There was one large competing branches further up the forearm and through the incision I was able to dissect this out and clipped the branch. There was an excellent thrill in the fistula. Hemostasis was obtained in the wound. The wounds closed the deep layer 3-0 Vicryl and the skin closed with 4-0 Vicryl. Dermabond was applied. The patient tolerated the procedure well and was transferred to the recovery room in stable condition. All needle and sponge counts were correct.  Deitra Mayo, MD, FACS [facility]  DATE OF DICTATION:   11/29/2011

## 2011-11-29 NOTE — Discharge Summary (Signed)
Physician Discharge Summary  Patient ID: Jeremy Mccoy MRN: [id number] DOB/AGE: Apr 01, 1966 46 y.o.  Admit date: 11/04/2011 Discharge date: 11/29/2011  Discharge Diagnoses: 1.  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  COPD - tobacco abuse  3.  Cocaine abuse - 20 year hx 4.   Anemia 5.   Mild thrombocytopenia 6.  Hypertension 7.   Metabolic bone disease  Consults:  Dr.Goldsborough - Nephrology Dr. Tobie Lords - Rheumatology Dr. Trula Slade - [facility]  Significant Diagnostic Studies: 1. RENAL/URINARY TRACT ULTRASOUND COMPLETE  Comparison: None. Findings: Right Kidney: 14.1 cm length. Normal cortical thickness. Increased cortical echogenicity. No mass, hydronephrosis or shadowing calcification. No perinephric fluid.  Left Kidney: 15.0 cm length. Normal cortical thickness.  Increased cortical echogenicity. No mass, hydronephrosis shadowing  calcification. No perinephric fluid. Bladder: Normal appearance  IMPRESSION:  Mildly enlarged kidneys bilaterally demonstrating increased cortical echogenicity suggesting medical renal disease. No renal mass or evidence of urinary tract obstruction identified.  Burnetta Sabin, M.D. 2. CT Chest 11/10/2011 - IMPRESSION:  1. Bilateral small pleural effusion right greater than left with  bilateral basilar posterior atelectasis.  2. Bilateral small pulmonary nodules are noted. The largest  nodule pleural-based in the left upper lobe posteriorly measures  about 6 mm .Julien Girt POP, M.D.  Procedures/Treatments: 1.. US guided kidney biopsy 11/07/2011 - Dr. Pascal Lux 2. Plasmapheresis 3. Placement of right IJ tunneled dialysis catheter 11/10/11/2013 - Dr. Vernard Gambles 4. Hemodialysis 5. Placement of left radio-cephalic AVF - Dr. Scot Dock  HPI: Jeremy Mccoy is an 46 y.o. male long time crack cocaine user, former alcoholic, smoker who has not seen a physician or care provider in the past 6 years and does not have a  regular physician reports that he began to feel left rib pain approximately 2 weeks ago. His symptoms progressed to pain in the epigastric area of his abdomen that felt like air trapped in the abdomen and gas. He says the pain then alternated between the left side of the abdomen and the epigastric area for approximately one week. The pain became more severe and the patient developed nausea and dizziness. He reports that the pain and abdominal discomfort became so severe that he had trouble sleeping. He reports increased urination with only a small amount of urine. He reports the development of tingling and numbness in the hands. Approximately 2 days ago he reports that he began to his cough. He reports that he noticed blood in the sputum. He also reports that he has lost approximately 7 pounds over the past week. He reports that 2 days prior to admission he took five 200 mg ibuprofen tablets with no relief in symptoms. The patient reports that he noticed that his urine has been "dirty". The patient says that he's been experiencing shaking cold chills over the past several days. The patient denies subjective fever. The patient reports alternating constipation and loose stools. The patient reports no vomiting but has been severely nauseated. He decided to seek medical care because of the coughing up of blood. He went to the local urgent care center and was evaluated and then sent to the emergency department for further evaluation and management. He emergency department he was noted to have a creatinine greater than 10. He was hyperkalemic with a potassium of 6.0. He had some evidence of peak T waves on EKG. The patient was clinically dehydrated and pale. The patient reports no history of renal disease or hypertension in his past medical history. He does report  that he chronically uses cocaine and has done so for the past 20 years. He smokes one pack of cigarettes per day and has smoked for over 30 years. He quit  alcohol several years ago. He lives alone. He reports that he has a heavy work burden and works around toxic chemicals at work including methylene chloride and acetone. 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  He was admitted by Dr. Irwin Brakeman with [facility] and Nephrology was immediately consulted.  The management of the patient's care was transferred to the the Nephrology service 6/26 and accepted by Dr. Salem Senate.  Hospital Course: 1.  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  Hematuria and hemoptysis were suggestive of a pulmonary renal syndrome. The patient was hydrated and empiric IV steroids were initiated. Metabolic acidosis was corrected with bicarbonate. Kayexalate corrected elevated potassium.    Lab results showed a positive ANA , homogenous 1:80 with ds DNA Ab slightly elevated at 37 (Ref <30); Myeloperoxidase Abs was 128 ( Ref <20), Serine Protease  684 (Ref< 20). Complements were essentially normal and SPEP unrevealing.   A biopsy was done 6/24.  5/6 glomeruli showed with crescents with multiple + pauci-immune necrotizing GN on biopsy, +MPO +pr3 +ANA. He received the first of seven (qod) plasma exchanges 6/28.  His first cytoxan was given 6/27.  Septra DS was started MWF for PCP prophylaxis.  Rheumatology was consulted due to a concern for vasculitis/granumomatosis with polyangiitis.  Chest CT was done with results as above and per Rheumatology's recommendation, Hepatitis B surface antigen and Hep B C IgM serologies and TB testing were done and all were negative. He also had a negative hepatitis A IgM and HCV Ab.  In spite of good urine output, renal function did not improve and although he felt "better", dialysis was initiated 7/04 which improved renal function.  He remained dialysis dependent. A left AVF was placed the day of discharge.   He was accepted at the [facility] dialysis center with an emergency medicaid status.  It is imperative that he remain off cocaine in order to continue cytoxan.  Dr. Marval Regal ordered a drug screen 7/18 and 7/25. If clean, the rounding MD at [facility] will arrange for another dose of IV cytoxan due after 7/27 (0.5g/m2 for two more doses at the most).  A prednisone taper to 10 mg per day was given for discharge. The [facility] MD will arrange for the taper thereafter.   2.  COPD - tobacco abuse - he has received counseling regarding the need to discontinue tobacco. 3.  Cocaine abuse - 20 year hx; The SW and physicians talked to him extensively regarding the need to stop cocaine and the risks of continuing.  He has a history of prior efforts at rehab.  He has been given community resources and is encouraged to contact NA. It will be an extreme challenge for him to stay clean. 4.   Anemia - Hgb was 8.4 upon admission. Hgb gradually declined to 6.8; he was transfused 2 units of blood; tsat was 26% with a ferritin of 286. Folate and B 12 were normal.  He received Aranesp in the hospital and will be started on IV at discharge and transitioned to Epogen. Hgb was 9.3 at discharge.  5.   Mild thrombocytopenia - Platelets were normal upon admission at 171,000.  The dropped to 123,00 7/7 and then  a nadir of 110, 7/11.  This was likely due to cytoxan.  Platelets then ranged between 124K and 134K and were 124,000 at discharge.  They will be monitored monthly at his dialysis center.  He will be on tight heparin with dialysis. 6.  Hypertension - He became somewhat hypertensive during his hospitalization when may have been related to rehydration. As he had not been to a physician for at least six years, his baseline BP was not known.  Before initiating dialysis, he required several days of lasix.  BP at the time of discharge did not require medication for control.  He continues to have good urine output.  EDW will need to  be titrated. 7.  Metabolic bone disease - iPTH was 48.  He did not require vitamin D, be did require binders and was on a combination of renagel and tums.  He was given a 4 day supply of renagel from [facility] at discharge and will be given renagel or renvela samples at discharge.  Diet: 80/90 2-2 1200 cc/day Discharge Medications:  Medication List  As of 11/29/2011  8:49 PM   STOP taking these medications         aspirin 81 MG tablet      ibuprofen 200 MG tablet         TAKE these medications         acetaminophen 325 MG tablet   Commonly known as: TYLENOL   Take 2 tablets (650 mg total) by mouth every 6 (six) hours as needed (discomfort or fever).      calcium carbonate 500 MG chewable tablet   Commonly known as: TUMS - dosed in mg elemental calcium   Chew 2 tablets (400 mg of elemental calcium total) by mouth as needed (symptomatic hypocalcemia (peri-oral paresthesia, tetany, change in mental status)).      multivitamin Tabs tablet   Take 1 tablet by mouth at bedtime.      pantoprazole 40 MG tablet   Commonly known as: PROTONIX   Take 1 tablet (40 mg total) by mouth daily at 12 noon.      predniSONE 20 MG tablet   Commonly known as: DELTASONE   Take 3 tablets (60 mg total) by mouth daily with breakfast.      sevelamer 800 MG tablet   Commonly known as: RENVELA   Take 2 tablets (1,600 mg total) by mouth 3 (three) times daily with meals.      sulfamethoxazole-trimethoprim 400-80 MG per tablet   Commonly known as: BACTRIM,SEPTRA   Take 1 tablet by mouth 3 (three) times a week.            Disposition: improved; discharged to home Discharge Orders    Future Appointments: Provider: Department: Dept Phone: Center:   01/11/2012 2:30 PM [facility] [phone] [facility]   01/11/2012 3:30 PM Angelia Mould, MD [facility] [phone] [facility]     Activity as tolerated      Wound care instructions      Scheduling Instructions:   Do not get catheter  wet.  No showers.  Your dialysis staff will change your catheter dressing.     Discharge Vital Signs and Labs: Temp:  [96.8 F (36 C)-98.4 F (36.9 C)] 98 F (36.7 C) (07/16 0950) Pulse Rate:  [66-92] 69  (07/16 0950) Resp:  [12-19] 16  (07/16 0950) BP: (125-148)/(81-93) 141/93 mmHg (07/16 0950) SpO2:  [99 %-100 %] 100 % (07/16 0950) Weight:  [81.1 kg (178 lb  12.7 oz)] 81.1 kg (178 lb 12.7 oz) (07/15 2110)  Basename 11/28/11 0516 11/27/11 0548  WBC 5.2 5.7  HGB 9.3* 9.3*  HCT 28.2* 29.0*  PLT 124* 134*   Renal:  Basename 11/28/11 0514 11/27/11 0548  NA 142 138  K 4.3 4.7  CL 106 103  CO2 23 23  GLUCOSE 84 84  BUN 83* 80*  CREATININE 4.90* 5.02*  CALCIUM 8.3* 8.7  PHOS 5.0* 4.6  ALBUMIN 3.4* 3.8   80 minutes were spent completing this discharge summary, discharge med reconciliation, discharge patient instructions and communicating discharge information to the patient's dialysis center.  [place], PA-C [facility] Beeper [phone] 11/29/2011, 8:49 PM  I have seen and examined this patient and agree with plan as outlined above. Becka Lagasse A,MD 12/02/2011 10:29 AM

## 2011-11-29 NOTE — Progress Notes (Signed)
Patient ID: Jeremy Mccoy, male   DOB: 1966/02/27, 46 y.o.   MRN: [id number]  [facility] Progress Note    Subjective:   Feels good and ready to go home   Objective:   BP 141/93  Pulse 69  Temp 98 F (36.7 C) (Oral)  Resp 16  Ht 6' (1.829 m)  Wt 81.1 kg (178 lb 12.7 oz)  BMI 24.25 kg/m2  SpO2 100%  Physical Exam: Gen:WD WN  CVS:rrr Resp:CTA [id number] Ext:new left AVF +T/B  Labs: BMET  Lab 11/28/11 0514 11/27/11 0548 11/26/11 0730 11/25/11 0608 11/24/11 0605 11/23/11 1100 11/23/11 0610  NA 142 138 141 142 144 139 140  K 4.3 4.7 4.4 4.0 3.9 4.5 4.2  CL 106 103 103 105 108 107 107  CO2 23 23 26 25 25  17* 19  GLUCOSE 84 84 77 87 85 144* 82  BUN 83* 80* 66* 63* 42* 80* 86*  CREATININE 4.90* 5.02* 4.75* 4.87* 3.94* 5.64* 6.29*  ALBUMIN 3.4* 3.8 4.1 3.6 3.9 4.7 3.9  CALCIUM 8.3* 8.7 8.9 8.0* 8.3* 8.0* 8.3*  PHOS 5.0* 4.6 4.1 4.5 4.6 3.7 4.8*   CBC  Lab 11/28/11 0516 11/27/11 0548 11/26/11 0730 11/25/11 0608  WBC 5.2 5.7 6.3 6.5  NEUTROABS -- -- -- --  HGB 9.3* 9.3* 9.6* 8.8*  HCT 28.2* 29.0* 29.9* 26.9*  MCV 92.8 93.2 94.0 93.1  PLT 124* 134* 131* 128*    @IMGRELPRIORS @ Medications:      . anticoagulant sodium citrate  5 mL Intravenous Once  . calcium gluconate  2 g Intravenous Once  . citrate dextrose  500 mL Intravenous QODAY  . darbepoetin (ARANESP) injection - NON-DIALYSIS  150 mcg Subcutaneous Q Sun-1800  . insulin aspart  0-15 Units Subcutaneous TID WC  . multivitamin  1 tablet Oral QHS  . pantoprazole  40 mg Oral Q1200  . predniSONE  70 mg Oral Q breakfast  . sevelamer  1,600 mg Oral TID WC  . sulfamethoxazole-trimethoprim  1 tablet Oral 3 times weekly     Assessment/ Plan:   1. 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 +ANCA and complements- treated with cytoxan. F/u as an outpt. Due for another dose until July 27 at dose of 0.5g/m2 for 2 more doses at most and decrease prednisone to 60mg  daily for one week then 50mg  daily for  one week, then 40mg  daily for one week, then 30mg  daily for one week, then 20mg  daily for one week then 10mg  daily for 2 weeks and finish taper per rounding MD at [facility] 2. ESRD- scheduled TTS at [facility] 3. Cocaine abuse- again stressed continued abstinence and he agreed to f/u with NA 4. Anemia:on epo 5. Vascular access- left forearm AVF 6. 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 Nutrition:stable 8. Hypertension:stable without meds 9. Disposition- d/c today and f/u at [facility] on Thursday [place] A 11/29/2011, 12:12 PM

## 2011-11-29 NOTE — Anesthesia Preprocedure Evaluation (Addendum)
Anesthesia Evaluation  Patient identified by MRN, date of birth, ID band Patient awake    Reviewed: Allergy & Precautions, H&P , NPO status , Patient's Chart, lab work & pertinent test results, reviewed documented beta blocker date and time   Airway Mallampati: II      Dental  (+) Teeth Intact and Dental Advisory Given   Pulmonary COPD   Pulmonary exam normal       Cardiovascular     Neuro/Psych    GI/Hepatic (+)     substance abuse  cocaine use and marijuana use,   Endo/Other    Renal/GU CRF and DialysisRenal disease     Musculoskeletal   Abdominal Normal abdominal exam  (+)   Peds  Hematology   Anesthesia Other Findings   Reproductive/Obstetrics                           Anesthesia Physical Anesthesia Plan  ASA: III  Anesthesia Plan: MAC   Post-op Pain Management:    Induction:   Airway Management Planned: Nasal Cannula  Additional Equipment:   Intra-op Plan:   Post-operative Plan:   Informed Consent: I have reviewed the patients History and Physical, chart, labs and discussed the procedure including the risks, benefits and alternatives for the proposed anesthesia with the patient or authorized representative who has indicated his/her understanding and acceptance.   Dental advisory given  Plan Discussed with: CRNA, Anesthesiologist and Surgeon  Anesthesia Plan Comments:        Anesthesia Quick Evaluation

## 2011-11-29 NOTE — Anesthesia Postprocedure Evaluation (Signed)
Anesthesia Post Note  Patient: Jeremy Mccoy  Procedure(s) Performed: Procedure(s) (LRB): ARTERIOVENOUS (AV) FISTULA CREATION (Left)  Anesthesia type: general  Patient location: PACU  Post pain: Pain level controlled  Post assessment: Patient's Cardiovascular Status Stable  Last Vitals:  Filed Vitals:   11/29/11 0950  BP: 141/93  Pulse: 69  Temp: 36.7 C  Resp: 16    Post vital signs: Reviewed and stable  Level of consciousness: sedated  Complications: No apparent anesthesia complications

## 2011-12-02 ENCOUNTER — Encounter (HOSPITAL_COMMUNITY): Payer: Self-pay | Admitting: Vascular Surgery

## 2011-12-02 DIAGNOSIS — D509 Iron deficiency anemia, unspecified: Secondary | ICD-10-CM | POA: Insufficient documentation

## 2011-12-02 DIAGNOSIS — N2581 Secondary hyperparathyroidism of renal origin: Secondary | ICD-10-CM | POA: Insufficient documentation

## 2012-01-10 ENCOUNTER — Encounter: Payer: Self-pay | Admitting: Vascular Surgery

## 2012-01-11 ENCOUNTER — Encounter (INDEPENDENT_AMBULATORY_CARE_PROVIDER_SITE_OTHER): Payer: Self-pay | Admitting: *Deleted

## 2012-01-11 ENCOUNTER — Encounter: Payer: Self-pay | Admitting: Vascular Surgery

## 2012-01-11 ENCOUNTER — Ambulatory Visit (INDEPENDENT_AMBULATORY_CARE_PROVIDER_SITE_OTHER): Payer: Self-pay | Admitting: Vascular Surgery

## 2012-01-11 VITALS — BP 165/108 | HR 86 | Resp 16 | Ht 72.0 in | Wt 165.8 lb

## 2012-01-11 DIAGNOSIS — N186 End stage renal disease: Secondary | ICD-10-CM

## 2012-01-11 DIAGNOSIS — Z4931 Encounter for adequacy testing for hemodialysis: Secondary | ICD-10-CM

## 2012-01-11 DIAGNOSIS — T82898A Other specified complication of vascular prosthetic devices, implants and grafts, initial encounter: Secondary | ICD-10-CM

## 2012-01-11 NOTE — Assessment & Plan Note (Signed)
This patient had a left radiocephalic AV fistula placed on 11/29/2011. He currently is dialyzed via a right IJ Diatek catheter. He comes in for a 6 week follow up visit. He has an elevated velocities at the proximal anastomosis which is not unusual. The fistula appears to be an enlarging nicely. There are multiple small branches at the antecubital level with not appear to be significant. I think the fistula should be ready for access in approximately 2 months. We will see him back as needed.

## 2012-01-11 NOTE — Progress Notes (Signed)
[facility] [place]  Patient name: Jeremy Mccoy MRN: [id number] DOB: 1965/12/12 Sex: male  REASON FOR VISIT: follow up after left radiocephalic AV fistula.  HPI: Jeremy Mccoy is a 46 y.o. male who had a left radiocephalic fistula placed on 11/29/2011. He comes in for a 6 week follow up visit. He's had no complaints referrable to the fistula and no pain or paresthesias in the left upper extremity. He has had no recent uremic symptoms.   REVIEW OF SYSTEMS: Valu.Nieves ] denotes positive finding; [  ] denotes negative finding  CARDIOVASCULAR:  [ ]  chest pain   [ ]  dyspnea on exertion    CONSTITUTIONAL:  [ ]  fever   [ ]  chills  PHYSICAL EXAM: Filed Vitals:   01/11/12 1603  BP: 165/108  Pulse: 86  Resp: 16  Height: 6' (1.829 m)  Weight: 165 lb 12.8 oz (75.206 kg)  SpO2: 100%   Body mass index is 22.49 kg/(m^2). GENERAL: The patient is a well-nourished male, in no acute distress. The vital signs are documented above. CARDIOVASCULAR: There is a regular rate and rhythm. He has a palpable left radial pulse. PULMONARY: There is good air exchange bilaterally without wheezing or rales. Fistula has an excellent thrill and appears to be enlarging nicely. The forearm cephalic vein on the left empties into the basilic system.  Duplex of his fistula shows a widely patent fistula with diameters ranging from 0.47-0.56 cm. There are some competing branches that appear fairly small at the antecubital level.  MEDICAL ISSUES:  End stage renal disease This patient had a left radiocephalic AV fistula placed on 11/29/2011. He currently is dialyzed via a right IJ Diatek catheter. He comes in for a 6 week follow up visit. He has an elevated velocities at the proximal anastomosis which is not unusual. The fistula appears to be an enlarging nicely. There are multiple small branches at the antecubital level with not appear to be significant. I think the fistula should be ready for access in  approximately 2 months. We will see him back as needed.   [place] [facility] [place] Beeper: [phone]

## 2012-02-28 ENCOUNTER — Encounter (HOSPITAL_COMMUNITY): Payer: Self-pay

## 2012-02-28 ENCOUNTER — Emergency Department (HOSPITAL_COMMUNITY)
Admission: EM | Admit: 2012-02-28 | Discharge: 2012-02-28 | Disposition: A | Discharge status: Home / Self Care | Payer: Medicare Other | Attending: Emergency Medicine | Admitting: Emergency Medicine

## 2012-02-28 DIAGNOSIS — R51 Headache: Secondary | ICD-10-CM

## 2012-02-28 DIAGNOSIS — Z992 Dependence on renal dialysis: Secondary | ICD-10-CM | POA: Insufficient documentation

## 2012-02-28 MED ORDER — HYDROCODONE-ACETAMINOPHEN 5-325 MG PO TABS
2.0000 | ORAL_TABLET | Freq: Once | ORAL | Status: AC
  Administered 2012-02-28: 2 via ORAL
  Filled 2012-02-28: qty 2

## 2012-02-28 MED ORDER — HYDROCODONE-ACETAMINOPHEN 5-325 MG PO TABS: 1.0000 | ORAL_TABLET | ORAL | Status: DC | PRN

## 2012-02-28 NOTE — ED Notes (Signed)
Patient presents stating he was only able do to 3 hours of dialysis today due to a severe headache.  States he has been having this headache off and on for about 1 month.  Stated he took about "10" ASA today.

## 2012-02-28 NOTE — ED Notes (Signed)
Severe headache that starts when he is on dialysis, headache begins about an hour and 15 minutes after dialysis has started.  Pt was only able to stay on there for 3 hours.

## 2012-02-28 NOTE — ED Provider Notes (Signed)
History     CSN: [id number]  Arrival date & time 02/28/12  1449   First MD Initiated Contact with Patient 02/28/12 1529      Chief Complaint  Patient presents with  . Headache    (Consider location/radiation/quality/duration/timing/severity/associated sxs/prior treatment) Patient is a 46 y.o. male presenting with headaches. The history is provided by the patient.  Headache  This is a recurrent problem. The current episode started 3 to 5 hours ago. The problem occurs constantly. Associated with: He associates onset of headache with dialysis. Pertinent negatives include no fever. Associated symptoms comments: Generalized headache that occurs with each dialysis treatment. Today he ended treatment early because of pain. No nausea or vomiting usually. Today he did have nausea but reports he took several aspirin to alleviate the headache and was better after vomiting x 1. No visual changes. No history of headache..    Past Medical History  Diagnosis Date  . COPD (chronic obstructive pulmonary disease)   . Smoker   . Crack cocaine use   . Dental caries   . Renal disorder     Past Surgical History  Procedure Date  . Av fistula placement 11/29/2011    Procedure: ARTERIOVENOUS (AV) FISTULA CREATION;  Surgeon: Angelia Mould, MD;  Location: [facility] OR;  Service: Vascular;  Laterality: Left;    Family History  Problem Relation Age of Onset  . Heart attack Father     History  Substance Use Topics  . Smoking status: Current Every Day Smoker -- 1.0 packs/day for 25 years    Types: Cigarettes  . Smokeless tobacco: Never Used  . Alcohol Use: No      Review of Systems  Constitutional: Negative for fever, appetite change and unexpected weight change.  HENT: Negative for congestion, neck pain and sinus pressure.   Respiratory: Negative.   Cardiovascular: Negative.   Gastrointestinal: Negative.  Negative for abdominal pain.       See HPI.  Musculoskeletal: Negative.   Skin:  Negative.  Negative for rash.  Neurological: Positive for headaches. Negative for syncope and weakness.  Psychiatric/Behavioral: Negative for confusion.    Allergies  Review of patient's allergies indicates no known allergies.  Home Medications   Current Outpatient Rx  Name Route Sig Dispense Refill  . AMLODIPINE BESYLATE 5 MG PO TABS Oral Take 5 mg by mouth daily.    . ASPIRIN 325 MG PO TABS Oral Take 325-1,625 mg by mouth once.    Marland Kitchen RENA-VITE PO TABS Oral Take 1 tablet by mouth at bedtime.    Marland Kitchen PANTOPRAZOLE SODIUM 40 MG PO TBEC Oral Take 40 mg by mouth daily at 12 noon.    Marland Kitchen PREDNISONE 10 MG PO TABS Oral Take 10 mg by mouth.    . SEVELAMER CARBONATE 800 MG PO TABS Oral Take 1,600 mg by mouth 3 (three) times daily with meals.    . SULFAMETHOXAZOLE-TMP DS 800-160 MG PO TABS Oral Take 1 tablet by mouth 3 (three) times a week. Take 1 tablet on Monday, Weds and Friday while taking Prednisone    . CALCIUM CARBONATE ANTACID 500 MG PO CHEW Oral Chew 2 tablets by mouth as needed.      BP 162/104  Pulse 90  Temp 97.6 F (36.4 C) (Oral)  SpO2 100%  Physical Exam  Constitutional: He is oriented to person, place, and time. He appears well-developed and well-nourished.  HENT:  Head: Normocephalic and atraumatic.  Eyes: EOM are normal. Pupils are equal, round, and reactive to  light.  Neck: Normal range of motion.  Cardiovascular: Normal rate and regular rhythm.   No murmur heard. Pulmonary/Chest: Effort normal and breath sounds normal. He has no wheezes. He has no rales.  Abdominal: Soft. There is no tenderness.  Musculoskeletal: He exhibits no edema.       Dialysis graft in left upper extremity.  Neurological: He is alert and oriented to person, place, and time. He has normal strength and normal reflexes. No sensory deficit. He displays a negative Romberg sign.       Ambulatory without ataxia.   Skin: Skin is warm and dry.    ED Course  Procedures (including critical care  time)  Labs Reviewed - No data to display No results found.   No diagnosis found.  1. Nonspecific headache  MDM  Recurrent headache limited to receiving dialysis treatment without deficit on exam. VSS. Pain is treated in ED, Rx given for limited number of Norco given history of substance abuse.         Leotis Shames, PA-C 02/28/12 1755

## 2012-02-28 NOTE — ED Notes (Signed)
States his pain is at least 10/10.  Sitting up in the bed rubbing his head.

## 2012-02-29 NOTE — ED Provider Notes (Signed)
Medical screening examination/treatment/procedure(s) were performed by non-physician practitioner and as supervising physician I was immediately available for consultation/collaboration.  Veryl Speak, MD 02/29/12 [phone]

## 2012-03-07 ENCOUNTER — Other Ambulatory Visit: Payer: Self-pay | Admitting: *Deleted

## 2012-03-13 ENCOUNTER — Other Ambulatory Visit (HOSPITAL_COMMUNITY): Payer: Self-pay | Admitting: Nephrology

## 2012-03-13 DIAGNOSIS — N186 End stage renal disease: Secondary | ICD-10-CM

## 2012-03-14 ENCOUNTER — Encounter (HOSPITAL_COMMUNITY): Payer: Medicaid Other

## 2012-03-16 ENCOUNTER — Ambulatory Visit (HOSPITAL_COMMUNITY): Payer: MEDICAID

## 2012-03-23 ENCOUNTER — Ambulatory Visit (HOSPITAL_COMMUNITY)
Admission: RE | Admit: 2012-03-23 | Discharge: 2012-03-23 | Disposition: A | Discharge status: Home / Self Care | Payer: Medicare Other | Source: Ambulatory Visit | Attending: Nephrology | Admitting: Nephrology

## 2012-03-23 DIAGNOSIS — Z452 Encounter for adjustment and management of vascular access device: Secondary | ICD-10-CM | POA: Insufficient documentation

## 2012-03-23 DIAGNOSIS — N186 End stage renal disease: Secondary | ICD-10-CM | POA: Insufficient documentation

## 2012-03-23 DIAGNOSIS — Z992 Dependence on renal dialysis: Secondary | ICD-10-CM | POA: Insufficient documentation

## 2012-03-23 MED ORDER — CHLORHEXIDINE GLUCONATE 4 % EX LIQD
CUTANEOUS | Status: AC
  Filled 2012-03-23: qty 45

## 2012-03-23 NOTE — Procedures (Signed)
Interventional Radiology Procedure Note  Procedure: Successful removal of right IJ tunneled HD catheter. Complications: None Recommendations: Continue dialysis via left wrist AVF  Signed,  Criselda Peaches, MD Vascular & Interventional Radiologist [facility] Radiology

## 2012-04-15 ENCOUNTER — Inpatient Hospital Stay (HOSPITAL_COMMUNITY)
Admission: EM | Admit: 2012-04-15 | Discharge: 2012-04-17 | DRG: 682 | Disposition: A | Discharge status: Home / Self Care | Payer: Medicare Other | Attending: Pulmonary Disease | Admitting: Pulmonary Disease

## 2012-04-15 ENCOUNTER — Encounter (HOSPITAL_COMMUNITY): Payer: Self-pay | Admitting: Internal Medicine

## 2012-04-15 ENCOUNTER — Emergency Department (HOSPITAL_COMMUNITY): Payer: Medicare Other

## 2012-04-15 DIAGNOSIS — Z91158 Patient's noncompliance with renal dialysis for other reason: Secondary | ICD-10-CM

## 2012-04-15 DIAGNOSIS — J4489 Other specified chronic obstructive pulmonary disease: Secondary | ICD-10-CM | POA: Diagnosis present

## 2012-04-15 DIAGNOSIS — K219 Gastro-esophageal reflux disease without esophagitis: Secondary | ICD-10-CM | POA: Diagnosis present

## 2012-04-15 DIAGNOSIS — D638 Anemia in other chronic diseases classified elsewhere: Secondary | ICD-10-CM | POA: Diagnosis present

## 2012-04-15 DIAGNOSIS — K921 Melena: Secondary | ICD-10-CM

## 2012-04-15 DIAGNOSIS — F1210 Cannabis abuse, uncomplicated: Secondary | ICD-10-CM | POA: Diagnosis present

## 2012-04-15 DIAGNOSIS — R319 Hematuria, unspecified: Secondary | ICD-10-CM

## 2012-04-15 DIAGNOSIS — H5350 Unspecified color vision deficiencies: Secondary | ICD-10-CM

## 2012-04-15 DIAGNOSIS — R0902 Hypoxemia: Secondary | ICD-10-CM

## 2012-04-15 DIAGNOSIS — R51 Headache: Secondary | ICD-10-CM | POA: Diagnosis present

## 2012-04-15 DIAGNOSIS — Z992 Dependence on renal dialysis: Secondary | ICD-10-CM

## 2012-04-15 DIAGNOSIS — D689 Coagulation defect, unspecified: Secondary | ICD-10-CM

## 2012-04-15 DIAGNOSIS — I498 Other specified cardiac arrhythmias: Secondary | ICD-10-CM | POA: Diagnosis present

## 2012-04-15 DIAGNOSIS — R109 Unspecified abdominal pain: Secondary | ICD-10-CM

## 2012-04-15 DIAGNOSIS — F1410 Cocaine abuse, uncomplicated: Secondary | ICD-10-CM | POA: Diagnosis present

## 2012-04-15 DIAGNOSIS — E875 Hyperkalemia: Secondary | ICD-10-CM

## 2012-04-15 DIAGNOSIS — T463X5A Adverse effect of coronary vasodilators, initial encounter: Secondary | ICD-10-CM | POA: Diagnosis present

## 2012-04-15 DIAGNOSIS — R05 Cough: Secondary | ICD-10-CM | POA: Diagnosis present

## 2012-04-15 DIAGNOSIS — R059 Cough, unspecified: Secondary | ICD-10-CM | POA: Diagnosis present

## 2012-04-15 DIAGNOSIS — D649 Anemia, unspecified: Secondary | ICD-10-CM

## 2012-04-15 DIAGNOSIS — N186 End stage renal disease: Secondary | ICD-10-CM

## 2012-04-15 DIAGNOSIS — R042 Hemoptysis: Secondary | ICD-10-CM

## 2012-04-15 DIAGNOSIS — J449 Chronic obstructive pulmonary disease, unspecified: Secondary | ICD-10-CM | POA: Diagnosis present

## 2012-04-15 DIAGNOSIS — I120 Hypertensive chronic kidney disease with stage 5 chronic kidney disease or end stage renal disease: Principal | ICD-10-CM | POA: Diagnosis present

## 2012-04-15 DIAGNOSIS — J9600 Acute respiratory failure, unspecified whether with hypoxia or hypercapnia: Secondary | ICD-10-CM

## 2012-04-15 DIAGNOSIS — Y92009 Unspecified place in unspecified non-institutional (private) residence as the place of occurrence of the external cause: Secondary | ICD-10-CM

## 2012-04-15 DIAGNOSIS — R634 Abnormal weight loss: Secondary | ICD-10-CM

## 2012-04-15 DIAGNOSIS — N179 Acute kidney failure, unspecified: Secondary | ICD-10-CM

## 2012-04-15 DIAGNOSIS — F17200 Nicotine dependence, unspecified, uncomplicated: Secondary | ICD-10-CM | POA: Diagnosis present

## 2012-04-15 DIAGNOSIS — K029 Dental caries, unspecified: Secondary | ICD-10-CM

## 2012-04-15 DIAGNOSIS — Z720 Tobacco use: Secondary | ICD-10-CM

## 2012-04-15 DIAGNOSIS — Z9115 Patient's noncompliance with renal dialysis: Secondary | ICD-10-CM

## 2012-04-15 DIAGNOSIS — J810 Acute pulmonary edema: Secondary | ICD-10-CM

## 2012-04-15 DIAGNOSIS — F1910 Other psychoactive substance abuse, uncomplicated: Secondary | ICD-10-CM

## 2012-04-15 LAB — COMPREHENSIVE METABOLIC PANEL
ALT: 13 U/L (ref 0–53)
Albumin: 3.9 g/dL (ref 3.5–5.2)
Alkaline Phosphatase: 55 U/L (ref 39–117)
Glucose, Bld: 89 mg/dL (ref 70–99)
Potassium: >7.5 mEq/L (ref 3.5–5.1)
Sodium: 137 mEq/L (ref 135–145)
Total Protein: 6.9 g/dL (ref 6.0–8.3)

## 2012-04-15 LAB — CBC WITH DIFFERENTIAL/PLATELET
Basophils Relative: 0 % (ref 0–1)
Eosinophils Absolute: 0 10*3/uL (ref 0.0–0.7)
HCT: 24.5 % — ABNORMAL LOW (ref 39.0–52.0)
Hemoglobin: 8.1 g/dL — ABNORMAL LOW (ref 13.0–17.0)
MCH: 32 pg (ref 26.0–34.0)
MCHC: 33.1 g/dL (ref 30.0–36.0)
MCV: 96.8 fL (ref 78.0–100.0)
Monocytes Absolute: 0.4 10*3/uL (ref 0.1–1.0)
Neutro Abs: 11.2 10*3/uL — ABNORMAL HIGH (ref 1.7–7.7)

## 2012-04-15 LAB — CREATININE, SERUM
GFR calc Af Amer: 9 mL/min — ABNORMAL LOW (ref >90)
GFR calc non Af Amer: 8 mL/min — ABNORMAL LOW (ref >90)

## 2012-04-15 LAB — BASIC METABOLIC PANEL
Calcium: 9.5 mg/dL (ref 8.4–10.5)
GFR calc Af Amer: 4 mL/min — ABNORMAL LOW (ref >90)
GFR calc non Af Amer: 3 mL/min — ABNORMAL LOW (ref >90)
Glucose, Bld: 86 mg/dL (ref 70–99)
Potassium: >7.5 mEq/L (ref 3.5–5.1)

## 2012-04-15 LAB — CBC
HCT: 24.9 % — ABNORMAL LOW (ref 39.0–52.0)
Hemoglobin: 8.3 g/dL — ABNORMAL LOW (ref 13.0–17.0)
MCV: 95 fL (ref 78.0–100.0)
WBC: 10.5 10*3/uL (ref 4.0–10.5)

## 2012-04-15 LAB — POTASSIUM: Potassium: 5.1 mEq/L (ref 3.5–5.1)

## 2012-04-15 LAB — PROCALCITONIN: Procalcitonin: 1.11 ng/mL

## 2012-04-15 MED ORDER — CALCIUM CARBONATE ANTACID 500 MG PO CHEW
2.0000 | CHEWABLE_TABLET | ORAL | Status: DC | PRN
  Filled 2012-04-15: qty 2

## 2012-04-15 MED ORDER — HEPARIN SODIUM (PORCINE) 5000 UNIT/ML IJ SOLN
5000.0000 [IU] | Freq: Three times a day (TID) | INTRAMUSCULAR | Status: DC
  Administered 2012-04-15 – 2012-04-17 (×4): 5000 [IU] via SUBCUTANEOUS
  Filled 2012-04-15 (×9): qty 1

## 2012-04-15 MED ORDER — ENALAPRILAT 1.25 MG/ML IV SOLN
1.2500 mg | Freq: Once | INTRAVENOUS | Status: AC
  Administered 2012-04-15: 1.25 mg via INTRAVENOUS
  Filled 2012-04-15: qty 1

## 2012-04-15 MED ORDER — NITROGLYCERIN IN D5W 200-5 MCG/ML-% IV SOLN
10.0000 ug/min | INTRAVENOUS | Status: DC
  Administered 2012-04-15: 10 ug/min via INTRAVENOUS
  Administered 2012-04-15: 20 ug/min via INTRAVENOUS
  Filled 2012-04-15: qty 250

## 2012-04-15 MED ORDER — DIPHENHYDRAMINE HCL 50 MG/ML IJ SOLN
25.0000 mg | Freq: Once | INTRAMUSCULAR | Status: AC
  Administered 2012-04-15: 25 mg via INTRAVENOUS
  Filled 2012-04-15: qty 1

## 2012-04-15 MED ORDER — AMLODIPINE BESYLATE 10 MG PO TABS
10.0000 mg | ORAL_TABLET | Freq: Every day | ORAL | Status: DC
  Administered 2012-04-15 – 2012-04-16 (×2): 10 mg via ORAL
  Filled 2012-04-15 (×3): qty 1

## 2012-04-15 MED ORDER — DEXTROSE 50 % IV SOLN
50.0000 mL | Freq: Once | INTRAVENOUS | Status: AC
  Administered 2012-04-15: 50 mL via INTRAVENOUS
  Filled 2012-04-15: qty 50

## 2012-04-15 MED ORDER — LOSARTAN POTASSIUM 50 MG PO TABS
100.0000 mg | ORAL_TABLET | Freq: Every day | ORAL | Status: DC
  Administered 2012-04-15 – 2012-04-16 (×2): 100 mg via ORAL
  Filled 2012-04-15 (×3): qty 2

## 2012-04-15 MED ORDER — INSULIN ASPART 100 UNIT/ML IV SOLN: 10.0000 [IU] | Freq: Once | INTRAVENOUS | Status: DC

## 2012-04-15 MED ORDER — VANCOMYCIN HCL 1000 MG IV SOLR
750.0000 mg | INTRAVENOUS | Status: DC
  Filled 2012-04-15: qty 750

## 2012-04-15 MED ORDER — HYDRALAZINE HCL 20 MG/ML IJ SOLN
10.0000 mg | INTRAMUSCULAR | Status: DC | PRN
  Administered 2012-04-15 – 2012-04-16 (×4): 20 mg via INTRAVENOUS
  Filled 2012-04-15 (×2): qty 1
  Filled 2012-04-15: qty 2
  Filled 2012-04-15 (×2): qty 1

## 2012-04-15 MED ORDER — ALTEPLASE 2 MG IJ SOLR: 2.0000 mg | Freq: Once | INTRAMUSCULAR | Status: AC | PRN

## 2012-04-15 MED ORDER — DEXTROSE 5 % IV SOLN
500.0000 mg | Freq: Once | INTRAVENOUS | Status: DC
  Filled 2012-04-15: qty 500

## 2012-04-15 MED ORDER — SODIUM BICARBONATE 8.4 % IV SOLN
50.0000 meq | Freq: Once | INTRAVENOUS | Status: AC
  Administered 2012-04-15: 50 meq via INTRAVENOUS
  Filled 2012-04-15: qty 50

## 2012-04-15 MED ORDER — SODIUM CHLORIDE 0.9 % IV SOLN: 100.0000 mL | INTRAVENOUS | Status: DC | PRN

## 2012-04-15 MED ORDER — NEPRO/CARBSTEADY PO LIQD
237.0000 mL | ORAL | Status: DC | PRN
  Filled 2012-04-15: qty 237

## 2012-04-15 MED ORDER — CLONIDINE HCL 0.2 MG PO TABS
0.2000 mg | ORAL_TABLET | Freq: Once | ORAL | Status: AC
  Administered 2012-04-15: 0.2 mg via ORAL
  Filled 2012-04-15: qty 1

## 2012-04-15 MED ORDER — RENA-VITE PO TABS
1.0000 | ORAL_TABLET | Freq: Every day | ORAL | Status: DC
  Administered 2012-04-15 – 2012-04-16 (×2): 1 via ORAL
  Filled 2012-04-15 (×3): qty 1

## 2012-04-15 MED ORDER — HYDROCODONE-ACETAMINOPHEN 5-325 MG PO TABS
1.0000 | ORAL_TABLET | ORAL | Status: DC | PRN
Start: 2012-04-15 — End: 2012-04-17
  Administered 2012-04-15 – 2012-04-16 (×8): 1 via ORAL
  Filled 2012-04-15 (×7): qty 1

## 2012-04-15 MED ORDER — VANCOMYCIN HCL 1000 MG IV SOLR
1500.0000 mg | Freq: Once | INTRAVENOUS | Status: AC
  Administered 2012-04-15: 1500 mg via INTRAVENOUS
  Filled 2012-04-15 (×2): qty 1500

## 2012-04-15 MED ORDER — HYDROMORPHONE HCL PF 1 MG/ML IJ SOLN
1.0000 mg | Freq: Once | INTRAMUSCULAR | Status: AC
  Administered 2012-04-15: 1 mg via INTRAVENOUS
  Filled 2012-04-15: qty 1

## 2012-04-15 MED ORDER — SEVELAMER CARBONATE 800 MG PO TABS
1600.0000 mg | ORAL_TABLET | Freq: Three times a day (TID) | ORAL | Status: DC
  Administered 2012-04-15 – 2012-04-17 (×5): 1600 mg via ORAL
  Filled 2012-04-15 (×9): qty 2

## 2012-04-15 MED ORDER — LORAZEPAM 2 MG/ML IJ SOLN
0.5000 mg | Freq: Once | INTRAMUSCULAR | Status: AC
  Administered 2012-04-15: 0.5 mg via INTRAVENOUS
  Filled 2012-04-15: qty 1

## 2012-04-15 MED ORDER — HEPARIN SODIUM (PORCINE) 1000 UNIT/ML DIALYSIS: 1000.0000 [IU] | INTRAMUSCULAR | Status: DC | PRN

## 2012-04-15 MED ORDER — HEPARIN SODIUM (PORCINE) 1000 UNIT/ML DIALYSIS
1000.0000 [IU] | INTRAMUSCULAR | Status: DC | PRN
  Administered 2012-04-16: 1000 [IU] via INTRAVENOUS_CENTRAL

## 2012-04-15 MED ORDER — PENTAFLUOROPROP-TETRAFLUOROETH EX AERO: 1.0000 "application " | INHALATION_SPRAY | CUTANEOUS | Status: DC | PRN

## 2012-04-15 MED ORDER — SODIUM POLYSTYRENE SULFONATE 15 GM/60ML PO SUSP
30.0000 g | Freq: Once | ORAL | Status: AC
Start: 2012-04-15 — End: 2012-04-15
  Administered 2012-04-15: 30 g via ORAL
  Filled 2012-04-15: qty 120

## 2012-04-15 MED ORDER — METOCLOPRAMIDE HCL 5 MG/ML IJ SOLN
10.0000 mg | Freq: Once | INTRAMUSCULAR | Status: AC
  Administered 2012-04-15: 10 mg via INTRAVENOUS
  Filled 2012-04-15: qty 2

## 2012-04-15 MED ORDER — LIDOCAINE-PRILOCAINE 2.5-2.5 % EX CREA
1.0000 "application " | TOPICAL_CREAM | CUTANEOUS | Status: DC | PRN
  Filled 2012-04-15: qty 5

## 2012-04-15 MED ORDER — SODIUM CHLORIDE 0.9 % IV SOLN
INTRAVENOUS | Status: DC
  Administered 2012-04-15: 20:00:00 via INTRAVENOUS

## 2012-04-15 MED ORDER — HYDROCODONE-ACETAMINOPHEN 5-325 MG PO TABS
ORAL_TABLET | ORAL | Status: AC
  Filled 2012-04-15: qty 1

## 2012-04-15 MED ORDER — SODIUM CHLORIDE 0.9 % IV SOLN
Freq: Once | INTRAVENOUS | Status: AC
  Administered 2012-04-15: 03:00:00 via INTRAVENOUS

## 2012-04-15 MED ORDER — DARBEPOETIN ALFA-POLYSORBATE 100 MCG/0.5ML IJ SOLN
100.0000 ug | INTRAMUSCULAR | Status: DC
  Administered 2012-04-16: 100 ug via INTRAVENOUS
  Filled 2012-04-15: qty 0.5

## 2012-04-15 MED ORDER — LIDOCAINE HCL (PF) 1 % IJ SOLN: 5.0000 mL | INTRAMUSCULAR | Status: DC | PRN

## 2012-04-15 MED ORDER — HEPARIN SODIUM (PORCINE) 1000 UNIT/ML DIALYSIS: 20.0000 [IU]/kg | INTRAMUSCULAR | Status: DC | PRN

## 2012-04-15 MED ORDER — PANTOPRAZOLE SODIUM 40 MG PO TBEC
40.0000 mg | DELAYED_RELEASE_TABLET | Freq: Every day | ORAL | Status: DC
  Administered 2012-04-16: 40 mg via ORAL
  Filled 2012-04-15: qty 1

## 2012-04-15 MED ORDER — SODIUM CHLORIDE 0.9 % IV SOLN
1.0000 g | Freq: Once | INTRAVENOUS | Status: DC
  Filled 2012-04-15: qty 10

## 2012-04-15 MED ORDER — DEXTROSE 5 % IV SOLN
1.0000 g | Freq: Once | INTRAVENOUS | Status: AC
  Administered 2012-04-15: 1 g via INTRAVENOUS
  Filled 2012-04-15: qty 10

## 2012-04-15 MED ORDER — HYDROXYZINE HCL 25 MG PO TABS
25.0000 mg | ORAL_TABLET | Freq: Three times a day (TID) | ORAL | Status: DC | PRN
  Administered 2012-04-15: 25 mg via ORAL
  Filled 2012-04-15 (×2): qty 1

## 2012-04-15 MED ORDER — PIPERACILLIN-TAZOBACTAM IN DEX 2-0.25 GM/50ML IV SOLN
2.2500 g | Freq: Three times a day (TID) | INTRAVENOUS | Status: DC
  Administered 2012-04-15 – 2012-04-16 (×3): 2.25 g via INTRAVENOUS
  Filled 2012-04-15 (×5): qty 50

## 2012-04-15 MED ORDER — INSULIN ASPART 100 UNIT/ML ~~LOC~~ SOLN
10.0000 [IU] | Freq: Once | SUBCUTANEOUS | Status: AC
  Administered 2012-04-15: 10 [IU] via INTRAVENOUS
  Filled 2012-04-15: qty 1

## 2012-04-15 MED ORDER — ACETAMINOPHEN 500 MG PO TABS
1000.0000 mg | ORAL_TABLET | Freq: Four times a day (QID) | ORAL | Status: DC | PRN
  Filled 2012-04-15: qty 2

## 2012-04-15 NOTE — ED Provider Notes (Signed)
History     CSN: [id number]  Arrival date & time 04/15/12  0112   First MD Initiated Contact with Patient 04/15/12 0124      Chief Complaint  Patient presents with  . Shortness of Breath  . Chest Pain  headache  (Consider location/radiation/quality/duration/timing/severity/associated sxs/prior treatment) HPI This 46 year old male has had a almost constant headache over 24 hours a day for the last 5 months ever since starting dialysis, he states every time he is dialyzed his headache is worse and so he is being dialyzed less and less each time, his headache was too bad today as we skip his dialysis and did not even go, he has chronic shortness of breath everyday for months but today he is gradual onset even worse than usual shortness of breath gradually worsening throughout the day with a constant chest pressure sensation for over 12 hours, he has a slight cough but no fever, he has his typical headache without any change in speech vision swallowing or understanding and no focal or lateralizing weakness or numbness or incoordination, he does have nausea chronically with his headaches and had 3 episodes of vomiting today with the last one being outside and he thinks there was blood in the vomit with his last episode but it was dark outside he does not know how much blood he vomited when he came inside wiped his mouth he noticed there was some blood at that time, he does not have any bloody stools, he does have chronic anemia, there is no treatment prior to arrival other than sublingual nitroglycerin from EMS which made his headache worse and oxygen because he was hypoxic in room air at 81%.  Past Medical History  Diagnosis Date  . COPD (chronic obstructive pulmonary disease)   . Smoker   . Crack cocaine use   . Dental caries   . 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 Av fistula placement  11/29/2011    Procedure: ARTERIOVENOUS (AV) FISTULA CREATION;  Surgeon: Angelia Mould, MD;  Location: [facility] OR;  Service: Vascular;  Laterality: Left;    Family History  Problem Relation Age of Onset  . Heart attack Father     History  Substance Use Topics  . Smoking status: Current Every Day Smoker -- 1.0 packs/day for 25 years    Types: Cigarettes  . Smokeless tobacco: Never Used  . Alcohol Use: No      Review of Systems 10 Systems reviewed and are negative for acute change except as noted in the HPI. Allergies  Review of patient's allergies indicates no known allergies.  Home Medications   No current outpatient prescriptions on file.  BP 144/111  Pulse 108  Temp 98.4 F (36.9 C) (Oral)  Resp 20  Wt 172 lb 9.9 oz (78.3 kg)  SpO2 95%  Physical Exam  Nursing note and vitals reviewed. Constitutional:       Awake, alert, nontoxic appearance with baseline speech for patient.  HENT:  Head: Atraumatic.  Mouth/Throat: No oropharyngeal exudate.  Eyes: EOM are normal. Pupils are equal, round, and reactive to light. Right eye exhibits no discharge. Left eye exhibits no discharge.  Neck: Neck supple.  Cardiovascular: Regular rhythm.   No murmur heard.      tachycardic  Pulmonary/Chest: No stridor. He is in respiratory distress. He has no wheezes. He has rales. He exhibits no tenderness.  Patient able to speak short sentences with moderately severe respiratory distress with room air pulse oximetry hypoxic 81% prior to arrival, pulse oximetry is in the 90s on oxygen mask, he started on BiPAP upon arrival to the ED, he has bibasilar crackles with no wheezing  Abdominal: Soft. Bowel sounds are normal. He exhibits no mass. There is no tenderness. There is no rebound.  Musculoskeletal: He exhibits no edema and no tenderness.       Baseline ROM, moves extremities with no obvious new focal weakness.  Lymphadenopathy:    He has no cervical adenopathy.  Neurological: He is  alert.       Awake, alert, cooperative and aware of situation; motor strength bilaterally; sensation normal to light touch bilaterally; peripheral visual fields full to confrontation; no facial asymmetry; tongue midline; major cranial nerves appear intact; no pronator drift, normal finger to nose bilaterally  Skin: No rash noted.  Psychiatric: He has a normal mood and affect.    ED Course  Procedures (including critical care time)  ECG: Sinus tachycardia, ventricular rate 121, normal axis, inverted T waves laterally, slight ST depression in V5 and V6, lateral inverted T waves and lateral ST changes new since Jun2013  No peaked T waves or wide QRS on ECG so will repeat BMP after Lab called with K 7.9 hemolyzed.0317  D/w Renal who will arrange for dialysis and [facility] who will see Pt for admit after emergent dialysis.  [facility] with Pt in ED. [phone] [facility] requested calcium order. 0515  Pt felt much better on Bipap awake, alert, speaking full sentences with pulse ox in 90s.  CRITICAL CARE Performed by: Babette Relic   Total critical care time: 49min  Critical care time was exclusive of separately billable procedures and treating other patients.  Critical care was necessary to treat or prevent imminent or life-threatening deterioration.  Critical care was time spent personally by me on the following activities: development of treatment plan with patient and/or surrogate as well as nursing, discussions with consultants, evaluation of patient's response to treatment, examination of patient, obtaining history from patient or surrogate, ordering and performing treatments and interventions, ordering and review of laboratory studies, ordering and review of radiographic studies, pulse oximetry and re-evaluation of patient's condition.  Labs Reviewed  PRO B NATRIURETIC PEPTIDE - Abnormal; Notable for the following:    Pro B Natriuretic peptide (BNP) >70000.0 (*)     All other components within normal  limits  COMPREHENSIVE METABOLIC PANEL - Abnormal; Notable for the following:    Potassium >7.5 (*)     Chloride 94 (*)     BUN 89 (*)     Creatinine, Ser 15.48 (*)     GFR calc non Af Amer 3 (*)     GFR calc Af Amer 4 (*)     All other components within normal limits  CBC WITH DIFFERENTIAL - Abnormal; Notable for the following:    WBC 12.3 (*)     RBC 2.53 (*)     Hemoglobin 8.1 (*)     HCT 24.5 (*)     RDW 16.4 (*)     Platelets 127 (*)     Neutrophils Relative 91 (*)     Lymphocytes Relative 6 (*)     Neutro Abs 11.2 (*)     All other components within normal limits  BASIC METABOLIC PANEL - Abnormal; Notable for the following:    Potassium >7.5 (*)     Chloride 94 (*)  BUN 89 (*)     Creatinine, Ser 15.67 (*)     GFR calc non Af Amer 3 (*)     GFR calc Af Amer 4 (*)     All other components within normal limits  CBC - Abnormal; Notable for the following:    RBC 2.62 (*)     Hemoglobin 8.3 (*)     HCT 24.9 (*)     RDW 16.0 (*)     Platelets 129 (*)     All other components within normal limits  CREATININE, SERUM - Abnormal; Notable for the following:    Creatinine, Ser 7.54 (*)  DELTA CHECK NOTED   GFR calc non Af Amer 8 (*)     GFR calc Af Amer 9 (*)     All other components within normal limits  OCCULT BLOOD, POC DEVICE  POCT I-STAT TROPONIN I  HEPATITIS B SURFACE ANTIGEN  POTASSIUM  PROCALCITONIN  MRSA PCR SCREENING  CULTURE, BLOOD (ROUTINE X 2)  CULTURE, BLOOD (ROUTINE X 2)   Dg Chest Port 1 View  04/15/2012  *RADIOLOGY REPORT*  Clinical Data: Shortness of breath.  PORTABLE CHEST - 1 VIEW  Comparison: Chest radiograph performed 11/04/2011, and CT of the chest performed 11/10/2011  Findings: There is patchy bilateral airspace opacification, predominantly on the right side, concerning for significant multifocal pneumonia.  Asymmetric pulmonary edema is thought to be less likely, given the appearance of the right-sided airspace opacity.  No definite pleural  effusion or pneumothorax is identified, though the costophrenic angles are incompletely imaged on this study.  The cardiomediastinal silhouette is mildly enlarged.  No acute osseous abnormalities are identified.  IMPRESSION:  1.  Bilateral airspace opacification, predominantly on the right side, concerning for significant multifocal pneumonia.  Asymmetric pulmonary edema is thought to be less likely, given the appearance of the right-sided airspace opacity. 2.  Mild cardiomegaly.   Original Report Authenticated By: Santa Lighter, M.D.      1. Hyperkalemia   2. Acute pulmonary edema   3. Hypoxemia   4. Respiratory failure, acute   5. End stage renal disease   Possible CAP Chronic Headache Anemia   MDM  The patient appears reasonably stabilized for admission considering the current resources, flow, and capabilities available in the ED at this time, and I doubt any other [facility] requiring further screening and/or treatment in the ED prior to admission.        Babette Relic, MD 04/16/12 [phone]

## 2012-04-15 NOTE — Procedures (Signed)
Patient was seen on dialysis and the procedure was supervised.  BFR 400  Via AVF BP is  185/125.   Patient appears to be tolerating treatment well  Kathryne Ramella A 04/15/2012

## 2012-04-15 NOTE — Progress Notes (Signed)
Nitro causing severe headache  Plan Dc nitro Do hydralazine q2h prn  - goal 150-160 sbp  Dr. Brand Males, M.D., [facility].C.P Pulmonary and Critical Care Medicine Staff Physician [place] [facility] Pager: [phone], If no answer or between  15:00h - 7:00h: call [phone]  04/15/2012 1:37 PM

## 2012-04-15 NOTE — ED Notes (Addendum)
Value received and Dr Stevie Kern notified of potassium level of 7.5 mildly hemolyzed

## 2012-04-15 NOTE — ED Notes (Signed)
Pt missed HD today because of recurrent HA. EMS called and pt vitals 194/124 hr 120s, sp02 81% on RA, RR 35. 100%O2 NRB started and and 2 SLNTG given for chest pressure fullness of 4-5/10. Vitals 167/114, 100% on 100%NRB. Pt coughing today and reports its getting worse. Pt reports vomiting with trace of blood on the second episode today . Pt reports to the MD that he "has been loosing blood". Pt also C/O HA 10/10 secondary to the SLNTG.

## 2012-04-15 NOTE — ED Notes (Signed)
PLease call [phone] if anything changes.

## 2012-04-15 NOTE — ED Notes (Addendum)
Critical value of Potassium >7.5 and not hemolyzed received and reported to Dr Stevie Kern

## 2012-04-15 NOTE — H&P (Addendum)
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 He has been fairly noncompliant to his dialysis due to the pain that he suffers afterwards. He knows that he requires over 4 hours of dialysis 3 days a week, but he only stays for 30 minutes to 2 hours per session. His last session was Thursday. He has developed increased shortness of breath over the last 24 hours as well as productive cough, but cannot describe his sputum character. He denies fever. He denies sick contacts. He complains of persistent headache. He does have nausea.  Past Medical History  Diagnosis Date  . COPD (chronic obstructive pulmonary disease)   . Smoker   . Crack cocaine use   . Dental caries   . 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 Av fistula placement 11/29/2011    Procedure: ARTERIOVENOUS (AV) FISTULA CREATION;  Surgeon: Angelia Mould, MD;  Location: [facility];  Service: Vascular;  Laterality: Left;   Prior to Admission medications   Medication Sig Start Date End Date Taking? Authorizing Provider  acetaminophen (TYLENOL) 500 MG tablet Take 1,000 mg by mouth every 6 (six) hours as needed. pain   Yes Historical Provider, MD  amLODipine (NORVASC) 10 MG tablet Take 10 mg by mouth daily.   Yes Historical Provider, MD  aspirin 325 MG tablet Take 325 mg by mouth once.    Yes Historical Provider, MD  calcium carbonate (TUMS - DOSED IN MG ELEMENTAL CALCIUM) 500 MG chewable tablet Chew 2 tablets by mouth as needed. 11/29/11 11/28/12 Yes Myriam Jacobson, PA  HYDROcodone-acetaminophen (NORCO/VICODIN) 5-325 MG per tablet Take 1 tablet by mouth every 4 (four) hours as needed for pain. 02/28/12  Yes Leotis Shames, PA-C    hydrOXYzine (ATARAX/VISTARIL) 25 MG tablet Take 25 mg by mouth 3 (three) times daily as needed. itching   Yes Historical Provider, MD  losartan (COZAAR) 100 MG tablet Take 100 mg by mouth at bedtime.   Yes Historical Provider, MD  multivitamin (RENA-VIT) TABS tablet Take 1 tablet by mouth at bedtime. 11/29/11  Yes Myriam Jacobson, PA  pantoprazole (PROTONIX) 40 MG tablet Take 40 mg by mouth daily at 12 noon. 11/29/11 11/28/12 Yes Myriam Jacobson, PA  sevelamer (RENVELA) 800 MG tablet Take 1,600 mg by mouth 3 (three) times daily with meals. 11/29/11 11/28/12 Yes Myriam Jacobson, PA   Allergies No Known Allergies  Family History Family History  Problem Relation Age of Onset  . Heart attack Father    Social History  reports that he has been smoking Cigarettes.  He has a 25 pack-year smoking history. He has never used smokeless tobacco. He reports that he uses illicit drugs (Cocaine and Marijuana). He reports that he does not drink alcohol.  Review Of Systems:  Full review of systems performed and is otherwise negative  Brief patient description:  Hyperkalemia due to dialysis noncompliance  Events Since Admission:   Current Status:  Vital Signs: Temp:  [97.7 F (36.5 C)-98.1 F (36.7 C)] 97.7 F (36.5 C) (12/01 0601) Pulse Rate:  [93-126] 107  (12/01 0700) Resp:  [24-40] 26  (12/01 0700) BP: (160-208)/(99-146) 187/120 mmHg (12/01 0700) SpO2:  [93 %-100 %] 98 % (12/01 0639) Weight:  [  165 lb 12.6 oz (75.2 kg)] 165 lb 12.6 oz (75.2 kg) (12/01 0500)  Physical Examination: General:  Elderly male appearing older than his stated age seen on dialysis. In no apparent distress, but complains of some headache Neuro:  Alert, oriented, and follows commands HEENT:  Pupils equal round and reactive to light, mucous membranes moist Neck:  Supple, no lymphadenopathy Cardiovascular: Mild tachycardia, regular rhythm, no murmurs rubs or gallops Lungs:   Rales bilaterally, no respiratory  distress on nasal cannula Abdomen:  Soft nontender nondistended Musculoskeletal:  No edema, no joint deformities Skin:  No rash  Active Problems:  Hyperkalemia  Polysubstance abuse  End stage renal disease   ASSESSMENT AND PLAN Hyperkalemia due to dialysis noncompliance -Admitting EKG showed peaked T waves. In the emergency department he received calcium and bicarbonate -Patient receiving emergent dialysis now through left upper extremity access site -Continue dialysis per renal consult  Hypertension/headache -Hold aspirin -He is on a nitroglycerin drip now -Nephrology is planning on removing fluid, and I suspect he will no longer require the nitroglycerin drip after dialysis -I restarted his home meds, unclear how compliant he is to these  Possible pneumonia  -Opacities greater on right than left, leukocytosis (12), but no fever  -This may very well be fluid overload -empiric antibiotics for HCAP (dialysis patient) -If chest x-ray improves after dialysis and pro calcitonin normal, antibiotics could be stopped         BEST PRACTICE / DISPOSITION Level of Care:  ICU; >35 minutes of critical care time spent Primary Service:  ICU Consultants:  Nephrology Code Status:  Full Diet:  Cardiac DVT Px:  Heparin GI Px:  Protonix Skin Integrity:  No rash no ulcer Social / Family:  Unavailable at this time  Zelphia Cairo, M.D. Pulmonary and [place] Pager: [phone]  04/15/2012, 7:16 AM

## 2012-04-15 NOTE — ED Notes (Signed)
Resp. At bedside along with RN.

## 2012-04-15 NOTE — Consult Note (Signed)
[facility] Renal Consultation Note  Requesting MD:  Dr. Titus Mould- CCM Indication for Consultation:  Pulmonary edema, hyperkalemia in noncompliant HD patient  HPI: Jeremy Mccoy is a 46 y.o. 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  It appears that patient has been noncompliant with HD, signing off early and blaming it on his headaches (which ironically are likely due to BP).  Patient did not go to HD today and presents to the ER late last night with complaints of orthhopnea/SOB also found to have a K of greater than 7.5.  He is being  emergently dialyzed at this time. Patient appears remarkably calm regarding this.  He admits to cocaine use on Friday.  BP is 173/90 on a NTG drip.   Information from the kidney center is not available at this time  PMHx:   Past Medical History  Diagnosis Date  . COPD (chronic obstructive pulmonary disease)   . Smoker   . Crack cocaine use   . Dental caries   . 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 Av fistula placement 11/29/2011    Procedure: ARTERIOVENOUS (AV) FISTULA CREATION;  Surgeon: Angelia Mould, MD;  Location: [facility] OR;  Service: Vascular;  Laterality: Left;    Family Hx:  Family History  Problem Relation Age of Onset  . Heart attack Father     Social History:  reports that he has been smoking Cigarettes.  He has a 25 pack-year smoking history. He has never used smokeless tobacco. He reports that he uses illicit drugs (Cocaine and Marijuana). He reports that he does not drink alcohol.  Allergies: No Known Allergies  Medications: Prior to Admission medications   Medication Sig Start Date End Date Taking? Authorizing Provider  acetaminophen (TYLENOL) 500 MG tablet Take 1,000 mg by mouth every 6 (six)  hours as needed. pain   Yes Historical Provider, MD  amLODipine (NORVASC) 10 MG tablet Take 10 mg by mouth daily.   Yes Historical Provider, MD  aspirin 325 MG tablet Take 325 mg by mouth once.    Yes Historical Provider, MD  calcium carbonate (TUMS - DOSED IN MG ELEMENTAL CALCIUM) 500 MG chewable tablet Chew 2 tablets by mouth as needed. 11/29/11 11/28/12 Yes Myriam Jacobson, PA  HYDROcodone-acetaminophen (NORCO/VICODIN) 5-325 MG per tablet Take 1 tablet by mouth every 4 (four) hours as needed for pain. 02/28/12  Yes Leotis Shames, PA-C  hydrOXYzine (ATARAX/VISTARIL) 25 MG tablet Take 25 mg by mouth 3 (three) times daily as needed. itching   Yes Historical Provider, MD  losartan (COZAAR) 100 MG tablet Take 100 mg by mouth at bedtime.   Yes Historical Provider, MD  multivitamin (RENA-VIT) TABS tablet Take 1 tablet by mouth at bedtime. 11/29/11  Yes Myriam Jacobson, PA  pantoprazole (PROTONIX) 40 MG tablet Take 40 mg by mouth daily at 12 noon. 11/29/11 11/28/12 Yes Myriam Jacobson, PA  sevelamer (RENVELA) 800 MG tablet Take 1,600 mg by mouth 3 (three) times daily with meals. 11/29/11 11/28/12 Yes Myriam Jacobson, PA    I have reviewed the patient's current medications.  Labs:  Results for orders placed during the hospital encounter of 04/15/12 (from the past 48 hour(s))  PRO B NATRIURETIC PEPTIDE     Status: Abnormal   Collection Time  04/15/12  1:09 AM      Component Value Range Comment   Pro B Natriuretic peptide (BNP) >70000.0 (*) 0.0 - 100.0 pg/mL   COMPREHENSIVE METABOLIC PANEL     Status: Abnormal   Collection Time   04/15/12  1:09 AM      Component Value Range Comment   Sodium 137  135 - 145 mEq/L    Potassium >7.5 (*) 3.5 - 5.1 mEq/L    Chloride 94 (*) 96 - 112 mEq/L    CO2 22  19 - 32 mEq/L    Glucose, Bld 89  70 - 99 mg/dL    BUN 89 (*) 6 - 23 mg/dL    Creatinine, Ser 15.48 (*) 0.50 - 1.35 mg/dL    Calcium 9.4  8.4 - 10.5 mg/dL    Total Protein 6.9  6.0 - 8.3 g/dL     Albumin 3.9  3.5 - 5.2 g/dL    AST 21  0 - 37 U/L    ALT 13  0 - 53 U/L    Alkaline Phosphatase 55  39 - 117 U/L    Total Bilirubin 0.3  0.3 - 1.2 mg/dL    GFR calc non Af Amer 3 (*) >90 mL/min    GFR calc Af Amer 4 (*) >90 mL/min   OCCULT BLOOD, POC DEVICE     Status: Normal   Collection Time   04/15/12  2:07 AM      Component Value Range Comment   Fecal Occult Bld NEGATIVE     POCT I-STAT TROPONIN I     Status: Normal   Collection Time   04/15/12  2:49 AM      Component Value Range Comment   Troponin i, poc 0.02  0.00 - 0.08 ng/mL    Comment 3            CBC WITH DIFFERENTIAL     Status: Abnormal   Collection Time   04/15/12  2:55 AM      Component Value Range Comment   WBC 12.3 (*) 4.0 - 10.5 K/uL    RBC 2.53 (*) 4.22 - 5.81 MIL/uL    Hemoglobin 8.1 (*) 13.0 - 17.0 g/dL    HCT 24.5 (*) 39.0 - 52.0 %    MCV 96.8  78.0 - 100.0 fL    MCH 32.0  26.0 - 34.0 pg    MCHC 33.1  30.0 - 36.0 g/dL    RDW 16.4 (*) 11.5 - 15.5 %    Platelets 127 (*) 150 - 400 K/uL    Neutrophils Relative 91 (*) 43 - 77 %    Lymphocytes Relative 6 (*) 12 - 46 %    Monocytes Relative 3  3 - 12 %    Eosinophils Relative 0  0 - 5 %    Basophils Relative 0  0 - 1 %    Neutro Abs 11.2 (*) 1.7 - 7.7 K/uL    Lymphs Abs 0.7  0.7 - 4.0 K/uL    Monocytes Absolute 0.4  0.1 - 1.0 K/uL    Eosinophils Absolute 0.0  0.0 - 0.7 K/uL    Basophils Absolute 0.0  0.0 - 0.1 K/uL    RBC Morphology POLYCHROMASIA PRESENT     BASIC METABOLIC PANEL     Status: Abnormal   Collection Time   04/15/12  3:18 AM      Component Value Range Comment   Sodium 137  135 - 145 mEq/L    Potassium >  7.5 (*) 3.5 - 5.1 mEq/L    Chloride 94 (*) 96 - 112 mEq/L    CO2 23  19 - 32 mEq/L    Glucose, Bld 86  70 - 99 mg/dL    BUN 89 (*) 6 - 23 mg/dL    Creatinine, Ser 15.67 (*) 0.50 - 1.35 mg/dL    Calcium 9.5  8.4 - 10.5 mg/dL    GFR calc non Af Amer 3 (*) >90 mL/min    GFR calc Af Amer 4 (*) >90 mL/min      ROS:  A comprehensive review  of systems was negative except for: Constitutional: positive for anorexia and fatigue Respiratory: positive for dyspnea on exertion and orthopnea, PND Cardiovascular: positive for chest pressure/discomfort Neurological: positive for headaches Behavioral/Psych: positive for tobacco use and cocaine use  Physical Exam: Filed Vitals:   04/15/12 0500  BP: 173/109  Pulse: 102  Temp:   Resp: 24     General: surprisingly alert and calm given situation.  Pale HEENT: PERRLA, EOMI, mucous membranes moist Neck: positive for JVD Heart: Tachy Lungs: CBS bilaterally Abdomen: soft, nontender, nondistended Extremities: peripheral edema.  Has a left arm AVF Skin: warm and dry Neuro: alert, nonfocal  Assessment/Plan: 46 year old WM with ESRD, noncompliance and cocaine use- admitted with volume overload, malignant HTN, hyperkalemia 1.SOB/hypoxia- CXR probably consistent with pulmonary edema with possible question of infectious process.  Appreciate CCM assist, they will empirically treat with antibiotics.  HD early this AM, goal of 6 liters volume removal.  He has peripheral edema as well as history of signing off HD so likely will need HD tomorrow as well 2. Hyperkalemia- due to above as well.  HD today on zero and 1 K bath to assist 3. ESRD  - Usual schedule is TTS at the [place] center via AVF.  Emergent HD tonight.  No records for  4. Anemia  - hgb low at 8.1.  Had been told that it was low at the center and did stool cards Will give aranesp here 5. Bones- Will obtain records from kidney center tomorrow to get meds 6. HTN- multifactorial due to volume overload/cocaine/noncompliance with BP meds. Continue home meds of cozaar and norvasc as well as serial HD.  Headaches likely due to HTN   Athea Haley A 04/15/2012, 6:13 AM

## 2012-04-15 NOTE — Progress Notes (Signed)
ANTIBIOTIC CONSULT NOTE - INITIAL  Pharmacy Consult for Vancomycin and Zosyn  Indication: possible PNA  No Known Allergies  Patient Measurements: Weight: 75.2 kg  Vital Signs: Temp: 97.7 F (36.5 C) (12/01 0601) Temp src: Oral (12/01 0601) BP: 187/120 mmHg (12/01 0700) Pulse Rate: 107  (12/01 0700)  Labs:  Basename 04/15/12 0318 04/15/12 0255 04/15/12 0109  WBC -- 12.3* --  HGB -- 8.1* --  PLT -- 127* --  LABCREA -- -- --  CREATININE 15.67* -- 15.48*   The CrCl is unknown because both a height and weight (above a minimum accepted value) are required for this calculation. No results found for this basename: VANCOTROUGH:2,VANCOPEAK:2,VANCORANDOM:2,GENTTROUGH:2,GENTPEAK:2,GENTRANDOM:2,TOBRATROUGH:2,TOBRAPEAK:2,TOBRARND:2,AMIKACINPEAK:2,AMIKACINTROU:2,AMIKACIN:2, in the last 72 hours   Microbiology: No results found for this or any previous visit (from the past 720 hour(s)).  Medical History: Past Medical History  Diagnosis Date  . COPD (chronic obstructive pulmonary disease)   . Smoker   . Crack cocaine use   . Dental caries   . 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 acetaminophen (TYLENOL) 500 MG tablet Take 1,000 mg by mouth every 6 (six) hours as needed. pain      . amLODipine (NORVASC) 10 MG tablet Take 10 mg by mouth daily.      Marland Kitchen aspirin 325 MG tablet Take 325 mg by mouth once.       . calcium carbonate (TUMS - DOSED IN MG ELEMENTAL CALCIUM) 500 MG chewable tablet Chew 2 tablets by mouth as needed.      Marland Kitchen HYDROcodone-acetaminophen (NORCO/VICODIN) 5-325 MG per tablet Take 1 tablet by mouth every 4 (four) hours as needed for pain.  6 tablet  0  . hydrOXYzine (ATARAX/VISTARIL) 25 MG tablet Take 25 mg by mouth 3 (three) times daily as needed. itching      . losartan (COZAAR) 100 MG tablet Take 100 mg by mouth at  bedtime.      . multivitamin (RENA-VIT) TABS tablet Take 1 tablet by mouth at bedtime.      . pantoprazole (PROTONIX) 40 MG tablet Take 40 mg by mouth daily at 12 noon.      . sevelamer (RENVELA) 800 MG tablet Take 1,600 mg by mouth 3 (three) times daily with meals.       Assessment: 46 yo male with SOB, possible PNA for empiric antibiotics  Goal of Therapy:  Vancomycin pre-HD level 15-25  Plan:  Vancomycin 1500 mg IV after HD today, then 750 mg IV qHD Zosyn 2.25 g IV q8h  Jonisha Kindig, Bronson Curb 04/15/2012,7:16 AM

## 2012-04-16 DIAGNOSIS — E875 Hyperkalemia: Secondary | ICD-10-CM

## 2012-04-16 DIAGNOSIS — J810 Acute pulmonary edema: Secondary | ICD-10-CM

## 2012-04-16 DIAGNOSIS — N179 Acute kidney failure, unspecified: Secondary | ICD-10-CM

## 2012-04-16 DIAGNOSIS — R634 Abnormal weight loss: Secondary | ICD-10-CM

## 2012-04-16 DIAGNOSIS — R109 Unspecified abdominal pain: Secondary | ICD-10-CM

## 2012-04-16 LAB — RENAL FUNCTION PANEL
Albumin: 3.4 g/dL — ABNORMAL LOW (ref 3.5–5.2)
Calcium: 9.8 mg/dL (ref 8.4–10.5)
Chloride: 94 mEq/L — ABNORMAL LOW (ref 96–112)
Creatinine, Ser: 11.22 mg/dL — ABNORMAL HIGH (ref 0.50–1.35)
GFR calc non Af Amer: 5 mL/min — ABNORMAL LOW (ref >90)
Phosphorus: 7.5 mg/dL — ABNORMAL HIGH (ref 2.3–4.6)

## 2012-04-16 LAB — CBC
MCV: 94.2 fL (ref 78.0–100.0)
Platelets: 154 10*3/uL (ref 150–400)
RDW: 16.2 % — ABNORMAL HIGH (ref 11.5–15.5)
WBC: 8 10*3/uL (ref 4.0–10.5)

## 2012-04-16 MED ORDER — DARBEPOETIN ALFA-POLYSORBATE 100 MCG/0.5ML IJ SOLN
INTRAMUSCULAR | Status: AC
  Administered 2012-04-16: 100 ug via INTRAVENOUS
  Filled 2012-04-16: qty 0.5

## 2012-04-16 NOTE — Progress Notes (Signed)
Subjective: alert, breathing is "better"  Objective Vital signs in last 24 hours: Filed Vitals:   04/16/12 1200 04/16/12 1300 04/16/12 1320 04/16/12 1333  BP: 169/87 143/80 153/97 156/97  Pulse: 92 114 121 107  Temp:   97.7 F (36.5 C)   TempSrc:   Oral   Resp:   19 23  Height:      Weight:      SpO2: 95% 98% 97%    Weight change: 3.1 kg (6 lb 13.4 oz)  Intake/Output Summary (Last 24 hours) at 04/16/12 1425 Last data filed at 04/16/12 1101  Gross per 24 hour  Intake 455.83 ml  Output     10 ml  Net 445.83 ml   Labs: Basic Metabolic Panel:  Lab [id number] 1256 04/15/12 1135 04/15/12 0642 04/15/12 0318 04/15/12 0109  NA 139 -- -- 137 137  K 4.6 -- 5.1 >7.5* >7.5*  CL 94* -- -- 94* 94*  CO2 28 -- -- 23 22  GLUCOSE 153* -- -- 86 89  BUN 61* -- -- 89* 89*  CREATININE 11.22* 7.54* -- 15.67* 15.48*  ALB -- -- -- -- --  CALCIUM 9.8 -- -- 9.5 9.4  PHOS 7.5* -- -- -- --   Liver Function Tests:  Lab 04/16/12 1256 04/15/12 0109  AST -- 21  ALT -- 13  ALKPHOS -- 55  BILITOT -- 0.3  PROT -- 6.9  ALBUMIN 3.4* 3.9   No results found for this basename: LIPASE:3,AMYLASE:3 in the last 168 hours No results found for this basename: AMMONIA:3 in the last 168 hours CBC:  Lab 04/16/12 1256 04/15/12 1135 04/15/12 0255  WBC 8.0 10.5 12.3*  NEUTROABS -- -- 11.2*  HGB 8.8* 8.3* 8.1*  HCT 26.0* 24.9* 24.5*  MCV 94.2 95.0 96.8  PLT 154 129* 127*   PT/INR: @labrcntip (inr:5) Cardiac Enzymes: No results found for this basename: CKTOTAL:5,CKMB:5,CKMBINDEX:5,TROPONINI:5 in the last 168 hours CBG: No results found for this basename: GLUCAP:5 in the last 168 hours  Iron Studies: No results found for this basename: IRON:30,TIBC:30,TRANSFERRIN:30,FERRITIN:30 in the last 168 hours  Physical Exam:  Blood pressure 156/97, pulse 107, temperature 97.7 F (36.5 C), temperature source Oral, resp. rate 23, height 6' (1.829 m), weight 78.3 kg (172 lb 9.9 oz), SpO2 97.00%.  Gen: alert, no  distress HEENT: PERRLA, EOMI, mucous membranes moist  Neck: positive for JVD  Heart: regular, no rub or S3 Lungs: clear bilaterally  Abdomen: soft, nontender, nondistended  Extremities: no peripheral edema. Has a left arm AVF  Skin: warm and dry  Neuro: alert, nonfocal   Outpatient HD: [facility], TTS, EDW 73kg, 4 hr 15 min  Assessment/Plan: 46 year old WM with ESRD, noncompliance and cocaine use- admitted with volume overload, malignant HTN, hyperkalemia   1. SOB/hypoxia- pulmonary edema with possible question of infectious process. Off abx now. HD yest with large volume UF (5kg), HD again today, max UF as tolerated. Vol overload much better today but still 5 kg over dry weight.  2. Hyperkalemia- due to missed HD as well. HD yesterday, K+ down to 4.6 today 3. ESRD, cont hd tts with extra HD  today 4. Anemia - hgb low at 8.1. Had been told that it was low at the center and did stool cards Will give aranesp here  5. Bones- Will obtain records from kidney center today to get meds  6. HTN- multifactorial due to volume overload/cocaine/noncompliance with BP meds. Continue home meds of cozaar and norvasc as well as serial  HD. Headaches likely due to HTN 7. Dispo- OK for discharge from renal standpoint if stable after HD today    Kelly Splinter  MD [facility] [phone] pgr    [phone] cell 04/16/2012, 2:25 PM

## 2012-04-16 NOTE — H&P (Signed)
  Name: Jeremy Mccoy MRN: [id number] DOB: January 16, 1966    LOS: 1  PULMONARY / CRITICAL CARE MEDICINE  HPI:   46 year old male cocaine/tobacco abuser with past medical history significant for end-stage renal disease hyperkalemia / pulm edema due to dialysis noncompliance / coc.  Events Since Admission: Emergent HD, pulm edema better Neg 5 liters  Current Status: In HD  Vital Signs: Temp:  [98 F (36.7 C)-99.4 F (37.4 C)] 98.2 F (36.8 C) (12/02 0800) Pulse Rate:  [83-129] 92  (12/02 1200) Resp:  [16-20] 20  (12/02 0400) BP: (135-183)/(85-133) 169/87 mmHg (12/02 1200) SpO2:  [90 %-96 %] 95 % (12/02 1200) Weight:  [78.3 kg (172 lb 9.9 oz)] 78.3 kg (172 lb 9.9 oz) (12/02 0500)  Physical Examination: General: alert oriented Neuro: nonfocal HEENT: less jvd PULM: coarse improved bases CV: s1 s2 RRR GI: soft, BS increased, no r Extremities: no cyanosis, less edema  Active Problems:  Hyperkalemia  Polysubstance abuse  End stage renal disease   ASSESSMENT AND PLAN Hyperkalemia due to dialysis noncompliance emergent HD And repeat HD now Repeat chem now Per renal kvo  Hypertension/headache -ensure on home med regimen, cozaar, amloidpine Re assess post HD second Treatment -tele -trop neg x 1 Some flip T lateral v 5 v 6, repeat ecg post K correction -counsel to stop cocaine  Possible pneumonia , unlikely -clinically improved with HD -pcxr repeat in am -Dc all abx and observe  BEST PRACTICE / DISPOSITION Level of Care:  ICU to tele 6700 Primary Service:  ICU to floor, dc in am ? Consultants:  Nephrology Code Status:  Full Diet:  Cardiac DVT Px:  Heparin GI Px:  Protonix Skin Integrity:  No rash no ulcer Social / Family:  Unavailable at this time  Jeremy Mccoy., M.D. [facility] [place] Pager: [phone]  04/16/2012, 1:25 PM

## 2012-04-16 NOTE — Progress Notes (Signed)
Patient left unit with Dialysis RN for hemodialysis. Report given.

## 2012-04-16 NOTE — Procedures (Signed)
I was present at this dialysis session. I have reviewed the session itself and made appropriate changes.   Kelly Splinter, MD [employer] 04/16/2012, 1:49 PM

## 2012-04-16 NOTE — Care Management Note (Signed)
    Page 1 of 1   04/16/2012     10:00:56 AM   CARE MANAGEMENT NOTE 04/16/2012  Patient:  Jeremy Mccoy, Jeremy Mccoy   Account Number:  [id number]  Date Initiated:  04/16/2012  Documentation initiated by:  Elissa Hefty  Subjective/Objective Assessment:   adm w hyperkalemia     Action/Plan:   lives w mother   Anticipated DC Date:     Anticipated DC Plan:        DC Planning Services  CM consult      Choice offered to / List presented to:             Status of service:   Medicare Important Message given?   (If response is "NO", the following Medicare IM given date fields will be blank) Date Medicare IM given:   Date Additional Medicare IM given:    Discharge Disposition:    Per UR Regulation:  Reviewed for med. necessity/level of care/duration of stay  If discussed at [place] of Stay Meetings, dates discussed:    Comments:  12/2 10a debbie Trysten Berti rn,bsn [id number]

## 2012-04-17 ENCOUNTER — Inpatient Hospital Stay (HOSPITAL_COMMUNITY): Payer: Medicare Other

## 2012-04-17 NOTE — Progress Notes (Signed)
PULMONARY  / CRITICAL CARE MEDICINE  Name: Jeremy Mccoy MRN: [id number] DOB: Oct 08, 1965    LOS: 2  REFERRING PROVIDER:  Riki Altes  CHIEF COMPLAINT:  Dypsnea  BRIEF PATIENT DESCRIPTION:  46 yo male smoker admitted on 04/15/2012 with progressive dyspnea, headache, cough, and hyperkalemia  2nd to HTN urgency and pulmonary edema after missing dialysis.  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  Breathing better.  Denies cough, sputum, chest pain, or abdominal pain.  Anxious to go home.  VITAL SIGNS: Temp:  [97.7 F (36.5 C)-98.9 F (37.2 C)] 98.8 F (37.1 C) (12/03 0514) Pulse Rate:  [69-124] 102  (12/03 0514) Resp:  [16-24] 18  (12/03 0514) BP: (140-170)/(80-112) 140/90 mmHg (12/03 0514) SpO2:  [95 %-99 %] 98 % (12/03 0514) Weight:  [153 lb 7 oz (69.6 kg)] 153 lb 7 oz (69.6 kg) (12/02 1739)  PHYSICAL EXAMINATION: General:  No distress Neuro:  Alert, normal strength HEENT:  No sinus tenderness Cardiovascular:  s1s2 regular, no murmur Lungs:  No wheeze/rales Abdomen:  Soft, non-tender Musculoskeletal:  No edema Skin:  No rashes   Lab 04/16/12 1256 04/15/12 1135 04/15/12 0642 04/15/12 0318 04/15/12 0109  NA 139 -- -- 137 137  K 4.6 -- 5.1 >7.5* --  CL 94* -- -- 94* 94*  CO2 28 -- -- 23 22  BUN 61* -- -- 89* 89*  CREATININE 11.22* 7.54* -- 15.67* --  GLUCOSE 153* -- -- 86 89    Lab 04/16/12 1256 04/15/12 1135 04/15/12 0255  HGB 8.8* 8.3* 8.1*  HCT 26.0* 24.9* 24.5*  WBC 8.0 10.5 12.3*  PLT 154 129* 127*   No results found.  ASSESSMENT / PLAN:  A: Acute respiratory failure 2nd  to HTN urgency and pulmonary edema. Improved with HD.  Doubt pneumonia. P: Repeat portable CXR 12/03>>if okay then likely d/c home  A: ESRD P: F/u at [facility] for T, Th, Sa dialysis  A: Hyperkalemia. Resolved  A: HTN urgency. Improved with HD. P: Continue amlodipine, losartan, asa  A: Headache. Resolved.  A: Anemia of chronic disease. P: F/u CBC as outpt  A: tobacco abuse with reported hx of COPD. P: Will need f/u as outpt with PCP  A: GERD. P: Continue protonix  Okay for d/c home 12/03 if CXR improved.  Chesley Mires, MD [facility] Pulmonary/Critical Care 04/17/2012, 7:50 AM Pager:  [phone] After 3pm call: [phone]

## 2012-04-17 NOTE — Progress Notes (Signed)
Patient was discharged home. Patient was discharged with discharge instructions. Patient was told to call doctor with questions or concerns or go to the emergency room in case of an emergency. Patient walked out of hospital alone to his ride. He was asked if he wanted a wheelchair or someone to walk him out but he refused. Patient was stable upon discharge.

## 2012-04-17 NOTE — Discharge Summary (Signed)
Physician Discharge Summary  Patient ID: Jeremy Mccoy MRN: [id number] DOB/AGE: 46/08/1965 46 y.o.  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  Improved with HD. Doubt pneumonia.  P:  Repeat portable CXR 12/03>>if okay then likely d/c home  A: ESRD  P:  F/u at [facility] for T, Th, Sa dialysis  A: Hyperkalemia.  Resolved  A: HTN urgency.  Improved with HD.  P:  Continue amlodipine, losartan, asa  A: Headache.  Resolved.  A: Anemia of chronic disease.  P:  F/u CBC as outpt  A: tobacco abuse with reported hx of COPD.  P:  Will need f/u as outpt with PCP  A: GERD.  P:  Continue protonix  Okay for d/c home 12/03       Labs at discharge Lab Results  Component Value Date   CREATININE 11.22* 04/16/2012   BUN 61* 04/16/2012   NA 139 04/16/2012   K 4.6 04/16/2012   CL 94* 04/16/2012   CO2 28 04/16/2012   Lab Results  Component Value Date   WBC 8.0 04/16/2012   HGB 8.8* 04/16/2012   HCT 26.0* 04/16/2012   MCV 94.2 04/16/2012   PLT 154 04/16/2012   Lab Results  Component Value Date   ALT 13 04/15/2012   AST 21 04/15/2012   ALKPHOS 55 04/15/2012   BILITOT 0.3 04/15/2012   Lab Results  Component Value Date   INR 1.17 11/05/2011   INR 1.14 11/04/2011    Current radiology studies Dg Chest Port 1 View  04/17/2012  *RADIOLOGY REPORT*  Clinical Data: Pulmonary edema,  follow-up, history of COPD and smoking  PORTABLE CHEST - 1 VIEW  Comparison: Portable chest x-ray of 04/15/2012  Findings: There has been interval improvement in the airspace disease most consistent with improving edema.  Mild cardiomegaly is stable.  No bony abnormality is seen.  IMPRESSION: Improvement in airspace disease most consistent with improvement in edema.   Original Report Authenticated By: Ivar Drape, M.D.     Disposition:  01-Home or Self Care  Discharge Orders    Future Orders Please Complete By Expires   Discharge patient          Medication List     As of 04/17/2012  8:57 AM    TAKE these medications         acetaminophen 500 MG tablet   Commonly known as: TYLENOL   Take 1,000 mg by mouth every 6 (six) hours as needed. pain      amLODipine 10 MG tablet   Commonly known as: NORVASC   Take 10 mg by mouth daily.      aspirin 325 MG tablet   Take 325 mg by mouth once.      calcium carbonate 500 MG chewable tablet   Commonly known as: TUMS - dosed in mg elemental calcium   Chew 2 tablets by mouth as needed.      HYDROcodone-acetaminophen 5-325 MG per  tablet   Commonly known as: NORCO/VICODIN   Take 1 tablet by mouth every 4 (four) hours as needed for pain.      hydrOXYzine 25 MG tablet   Commonly known as: ATARAX/VISTARIL   Take 25 mg by mouth 3 (three) times daily as needed. itching      losartan 100 MG tablet   Commonly known as: COZAAR   Take 100 mg by mouth at bedtime.      multivitamin Tabs tablet   Take 1 tablet by mouth at bedtime.      pantoprazole 40 MG tablet   Commonly known as: PROTONIX   Take 40 mg by mouth daily at 12 noon.      sevelamer 800 MG tablet   Commonly known as: RENVELA   Take 1,600 mg by mouth 3 (three) times daily with meals.         Discharged Condition: Good Vital signs at Discharge. Temp:  [97.7 F (36.5 C)-98.9 F (37.2 C)] 98.8 F (37.1 C) (12/03 0514) Pulse Rate:  [69-122] 102  (12/03 0514) Resp:  [16-24] 18   (12/03 0514) BP: (140-169)/(80-108) 140/90 mmHg (12/03 0514) SpO2:  [95 %-99 %] 98 % (12/03 0514) Weight:  [69.6 kg (153 lb 7 oz)] 69.6 kg (153 lb 7 oz) (12/02 1739) Office follow up Special Information or instructions. Follow up with Dr. Jonni Sanger 04-18-12. No need to follow up with PCCM. Signed: Richardson Landry Minor ACNP Maryanna Shape PCCM Pager [phone] till 3 pm If no answer page [phone] 04/17/2012, 8:57 AM  Chesley Mires, MD

## 2012-04-21 LAB — CULTURE, BLOOD (ROUTINE X 2): Culture: NO GROWTH

## 2012-06-12 ENCOUNTER — Inpatient Hospital Stay (HOSPITAL_COMMUNITY): Payer: Medicare Other

## 2012-06-12 ENCOUNTER — Inpatient Hospital Stay (HOSPITAL_COMMUNITY)
Admission: EM | Admit: 2012-06-12 | Discharge: 2012-06-14 | DRG: 189 | Disposition: A | Discharge status: Home / Self Care | Payer: Medicare Other | Attending: Internal Medicine | Admitting: Internal Medicine

## 2012-06-12 ENCOUNTER — Encounter (HOSPITAL_COMMUNITY): Payer: Self-pay | Admitting: Physical Medicine and Rehabilitation

## 2012-06-12 ENCOUNTER — Emergency Department (HOSPITAL_COMMUNITY): Payer: Medicare Other

## 2012-06-12 DIAGNOSIS — E876 Hypokalemia: Secondary | ICD-10-CM | POA: Diagnosis present

## 2012-06-12 DIAGNOSIS — R042 Hemoptysis: Secondary | ICD-10-CM | POA: Diagnosis present

## 2012-06-12 DIAGNOSIS — E8779 Other fluid overload: Secondary | ICD-10-CM | POA: Diagnosis present

## 2012-06-12 DIAGNOSIS — I10 Essential (primary) hypertension: Secondary | ICD-10-CM

## 2012-06-12 DIAGNOSIS — J449 Chronic obstructive pulmonary disease, unspecified: Secondary | ICD-10-CM | POA: Diagnosis present

## 2012-06-12 DIAGNOSIS — J4489 Other specified chronic obstructive pulmonary disease: Secondary | ICD-10-CM | POA: Diagnosis present

## 2012-06-12 DIAGNOSIS — K029 Dental caries, unspecified: Secondary | ICD-10-CM | POA: Diagnosis present

## 2012-06-12 DIAGNOSIS — D631 Anemia in chronic kidney disease: Secondary | ICD-10-CM | POA: Diagnosis present

## 2012-06-12 DIAGNOSIS — E8770 Fluid overload, unspecified: Secondary | ICD-10-CM

## 2012-06-12 DIAGNOSIS — I776 Arteritis, unspecified: Secondary | ICD-10-CM | POA: Diagnosis present

## 2012-06-12 DIAGNOSIS — F1410 Cocaine abuse, uncomplicated: Secondary | ICD-10-CM | POA: Insufficient documentation

## 2012-06-12 DIAGNOSIS — Z992 Dependence on renal dialysis: Secondary | ICD-10-CM

## 2012-06-12 DIAGNOSIS — N039 Chronic nephritic syndrome with unspecified morphologic changes: Secondary | ICD-10-CM | POA: Diagnosis present

## 2012-06-12 DIAGNOSIS — F17200 Nicotine dependence, unspecified, uncomplicated: Secondary | ICD-10-CM | POA: Diagnosis present

## 2012-06-12 DIAGNOSIS — F1910 Other psychoactive substance abuse, uncomplicated: Secondary | ICD-10-CM

## 2012-06-12 DIAGNOSIS — N186 End stage renal disease: Secondary | ICD-10-CM | POA: Diagnosis present

## 2012-06-12 DIAGNOSIS — Z79899 Other long term (current) drug therapy: Secondary | ICD-10-CM

## 2012-06-12 DIAGNOSIS — I120 Hypertensive chronic kidney disease with stage 5 chronic kidney disease or end stage renal disease: Secondary | ICD-10-CM | POA: Diagnosis present

## 2012-06-12 DIAGNOSIS — N2581 Secondary hyperparathyroidism of renal origin: Secondary | ICD-10-CM | POA: Diagnosis present

## 2012-06-12 DIAGNOSIS — R51 Headache: Secondary | ICD-10-CM | POA: Diagnosis present

## 2012-06-12 DIAGNOSIS — E875 Hyperkalemia: Secondary | ICD-10-CM | POA: Diagnosis present

## 2012-06-12 DIAGNOSIS — Z7982 Long term (current) use of aspirin: Secondary | ICD-10-CM

## 2012-06-12 DIAGNOSIS — F411 Generalized anxiety disorder: Secondary | ICD-10-CM | POA: Diagnosis present

## 2012-06-12 DIAGNOSIS — J811 Chronic pulmonary edema: Principal | ICD-10-CM | POA: Diagnosis present

## 2012-06-12 HISTORY — DX: Essential (primary) hypertension: I10

## 2012-06-12 HISTORY — DX: End stage renal disease: N18.6

## 2012-06-12 LAB — RENAL FUNCTION PANEL
Albumin: 3.7 g/dL (ref 3.5–5.2)
BUN: 81 mg/dL — ABNORMAL HIGH (ref 6–23)
Creatinine, Ser: 17.54 mg/dL — ABNORMAL HIGH (ref 0.50–1.35)
Phosphorus: 5.6 mg/dL — ABNORMAL HIGH (ref 2.3–4.6)

## 2012-06-12 LAB — CBC WITH DIFFERENTIAL/PLATELET
Basophils Absolute: 0 10*3/uL (ref 0.0–0.1)
HCT: 32.9 % — ABNORMAL LOW (ref 39.0–52.0)
Hemoglobin: 10.9 g/dL — ABNORMAL LOW (ref 13.0–17.0)
Lymphocytes Relative: 7 % — ABNORMAL LOW (ref 12–46)
Lymphs Abs: 0.6 10*3/uL — ABNORMAL LOW (ref 0.7–4.0)
Monocytes Absolute: 0.3 10*3/uL (ref 0.1–1.0)
Monocytes Relative: 4 % (ref 3–12)
Neutro Abs: 7.3 10*3/uL (ref 1.7–7.7)
RBC: 3.36 MIL/uL — ABNORMAL LOW (ref 4.22–5.81)
WBC: 8.1 10*3/uL (ref 4.0–10.5)

## 2012-06-12 LAB — APTT: aPTT: 45 seconds — ABNORMAL HIGH (ref 24–37)

## 2012-06-12 LAB — BASIC METABOLIC PANEL
BUN: 34 mg/dL — ABNORMAL HIGH (ref 6–23)
Creatinine, Ser: 9.69 mg/dL — ABNORMAL HIGH (ref 0.50–1.35)
GFR calc Af Amer: 7 mL/min — ABNORMAL LOW (ref >90)
GFR calc non Af Amer: 6 mL/min — ABNORMAL LOW (ref >90)
Potassium: 6 mEq/L — ABNORMAL HIGH (ref 3.5–5.1)

## 2012-06-12 LAB — POCT I-STAT, CHEM 8
BUN: 81 mg/dL — ABNORMAL HIGH (ref 6–23)
Calcium, Ion: 1.09 mmol/L — ABNORMAL LOW (ref 1.12–1.23)
Creatinine, Ser: 17.8 mg/dL — ABNORMAL HIGH (ref 0.50–1.35)
TCO2: 25 mmol/L (ref 0–100)

## 2012-06-12 LAB — TYPE AND SCREEN: DAT, IgG: POSITIVE

## 2012-06-12 LAB — HEPATIC FUNCTION PANEL
Albumin: 4 g/dL (ref 3.5–5.2)
Alkaline Phosphatase: 50 U/L (ref 39–117)
Total Protein: 7.2 g/dL (ref 6.0–8.3)

## 2012-06-12 MED ORDER — DEXTROSE 5 % IV SOLN: 2.0000 g | INTRAVENOUS | Status: DC

## 2012-06-12 MED ORDER — ONDANSETRON HCL 4 MG PO TABS: 4.0000 mg | ORAL_TABLET | Freq: Three times a day (TID) | ORAL | Status: DC | PRN

## 2012-06-12 MED ORDER — CALCIUM CARBONATE ANTACID 500 MG PO CHEW
2.0000 | CHEWABLE_TABLET | Freq: Three times a day (TID) | ORAL | Status: DC
  Administered 2012-06-13 (×2): 400 mg via ORAL
  Administered 2012-06-13: 200 mg via ORAL
  Administered 2012-06-14: 400 mg via ORAL
  Filled 2012-06-12 (×7): qty 2

## 2012-06-12 MED ORDER — ACETAMINOPHEN 500 MG PO TABS
500.0000 mg | ORAL_TABLET | Freq: Four times a day (QID) | ORAL | Status: DC | PRN
  Filled 2012-06-12: qty 1

## 2012-06-12 MED ORDER — CALCIUM GLUCONATE 10 % IV SOLN
INTRAVENOUS | Status: AC
  Filled 2012-06-12: qty 10

## 2012-06-12 MED ORDER — HYDROCODONE-ACETAMINOPHEN 5-325 MG PO TABS
2.0000 | ORAL_TABLET | ORAL | Status: DC | PRN
  Administered 2012-06-12: 2 via ORAL

## 2012-06-12 MED ORDER — SEVELAMER CARBONATE 800 MG PO TABS
1600.0000 mg | ORAL_TABLET | Freq: Three times a day (TID) | ORAL | Status: DC
  Administered 2012-06-13 (×2): 1600 mg via ORAL
  Administered 2012-06-13: 800 mg via ORAL
  Administered 2012-06-14: 1600 mg via ORAL
  Filled 2012-06-12 (×7): qty 2

## 2012-06-12 MED ORDER — HEPARIN SODIUM (PORCINE) 1000 UNIT/ML DIALYSIS: 1000.0000 [IU] | INTRAMUSCULAR | Status: DC | PRN

## 2012-06-12 MED ORDER — LIDOCAINE HCL (PF) 1 % IJ SOLN: 5.0000 mL | INTRAMUSCULAR | Status: DC | PRN

## 2012-06-12 MED ORDER — ALTEPLASE 2 MG IJ SOLR
2.0000 mg | Freq: Once | INTRAMUSCULAR | Status: DC | PRN
  Filled 2012-06-12: qty 2

## 2012-06-12 MED ORDER — OSELTAMIVIR PHOSPHATE 75 MG PO CAPS
75.0000 mg | ORAL_CAPSULE | ORAL | Status: DC
  Administered 2012-06-12: 75 mg via ORAL
  Filled 2012-06-12: qty 1

## 2012-06-12 MED ORDER — LIDOCAINE-PRILOCAINE 2.5-2.5 % EX CREA: 1.0000 "application " | TOPICAL_CREAM | CUTANEOUS | Status: DC | PRN

## 2012-06-12 MED ORDER — HYDROCODONE-ACETAMINOPHEN 5-325 MG PO TABS
ORAL_TABLET | ORAL | Status: AC
  Filled 2012-06-12: qty 2

## 2012-06-12 MED ORDER — OXYCODONE-ACETAMINOPHEN 5-325 MG PO TABS
2.0000 | ORAL_TABLET | ORAL | Status: DC | PRN
Start: 2012-06-12 — End: 2012-06-14
  Administered 2012-06-12 – 2012-06-13 (×7): 2 via ORAL
  Filled 2012-06-12 (×7): qty 2

## 2012-06-12 MED ORDER — DEXTROSE 50 % IV SOLN
1.0000 | Freq: Once | INTRAVENOUS | Status: AC
  Administered 2012-06-12: 50 mL via INTRAVENOUS
  Filled 2012-06-12: qty 50

## 2012-06-12 MED ORDER — SODIUM CHLORIDE 0.9 % IV SOLN
INTRAVENOUS | Status: AC
  Filled 2012-06-12: qty 100

## 2012-06-12 MED ORDER — ONDANSETRON HCL 4 MG/2ML IJ SOLN
4.0000 mg | Freq: Three times a day (TID) | INTRAMUSCULAR | Status: DC | PRN
  Administered 2012-06-12: 4 mg via INTRAVENOUS

## 2012-06-12 MED ORDER — HYDROXYZINE HCL 25 MG PO TABS
25.0000 mg | ORAL_TABLET | Freq: Three times a day (TID) | ORAL | Status: DC | PRN
  Filled 2012-06-12: qty 1

## 2012-06-12 MED ORDER — PANTOPRAZOLE SODIUM 40 MG PO TBEC
40.0000 mg | DELAYED_RELEASE_TABLET | Freq: Every day | ORAL | Status: DC
Start: 2012-06-13 — End: 2012-06-14
  Administered 2012-06-13: 40 mg via ORAL
  Filled 2012-06-12: qty 1

## 2012-06-12 MED ORDER — NITROGLYCERIN 2 % TD OINT: 2.0000 [in_us] | TOPICAL_OINTMENT | Freq: Once | TRANSDERMAL | Status: DC

## 2012-06-12 MED ORDER — SODIUM CHLORIDE 0.9 % IJ SOLN
3.0000 mL | Freq: Two times a day (BID) | INTRAMUSCULAR | Status: DC
  Administered 2012-06-12 – 2012-06-13 (×3): 3 mL via INTRAVENOUS

## 2012-06-12 MED ORDER — HEPARIN SODIUM (PORCINE) 1000 UNIT/ML DIALYSIS: 20.0000 [IU]/kg | INTRAMUSCULAR | Status: DC | PRN

## 2012-06-12 MED ORDER — VANCOMYCIN HCL IN DEXTROSE 1-5 GM/200ML-% IV SOLN: 1000.0000 mg | INTRAVENOUS | Status: DC

## 2012-06-12 MED ORDER — RENA-VITE PO TABS
1.0000 | ORAL_TABLET | Freq: Every day | ORAL | Status: DC
  Administered 2012-06-12: 20:00:00 via ORAL
  Administered 2012-06-13: 1 via ORAL
  Filled 2012-06-12 (×3): qty 1

## 2012-06-12 MED ORDER — SODIUM CHLORIDE 0.9 % IV SOLN
62.5000 mg | INTRAVENOUS | Status: DC
  Administered 2012-06-14: 62.5 mg via INTRAVENOUS
  Filled 2012-06-12 (×3): qty 5

## 2012-06-12 MED ORDER — SODIUM CHLORIDE 0.9 % IV SOLN: 100.0000 mL | INTRAVENOUS | Status: DC | PRN

## 2012-06-12 MED ORDER — VANCOMYCIN HCL 10 G IV SOLR
1750.0000 mg | Freq: Once | INTRAVENOUS | Status: AC
  Administered 2012-06-12: 1750 mg via INTRAVENOUS
  Filled 2012-06-12: qty 1750

## 2012-06-12 MED ORDER — SODIUM CHLORIDE 0.9 % IV SOLN
1.0000 g | Freq: Once | INTRAVENOUS | Status: AC
  Administered 2012-06-12: 1 g via INTRAVENOUS

## 2012-06-12 MED ORDER — DEXTROSE 5 % IV SOLN
2.0000 g | Freq: Once | INTRAVENOUS | Status: AC
  Administered 2012-06-12: 2 g via INTRAVENOUS
  Filled 2012-06-12: qty 2

## 2012-06-12 MED ORDER — DOXERCALCIFEROL 4 MCG/2ML IV SOLN
2.0000 ug | INTRAVENOUS | Status: DC
  Administered 2012-06-14: 2 ug via INTRAVENOUS
  Filled 2012-06-12: qty 2

## 2012-06-12 MED ORDER — ENOXAPARIN SODIUM 30 MG/0.3ML ~~LOC~~ SOLN
30.0000 mg | SUBCUTANEOUS | Status: DC
  Filled 2012-06-12 (×3): qty 0.3

## 2012-06-12 MED ORDER — HYDROCODONE-ACETAMINOPHEN 5-325 MG PO TABS
1.0000 | ORAL_TABLET | ORAL | Status: DC | PRN
  Administered 2012-06-12: 1 via ORAL
  Filled 2012-06-12 (×2): qty 1

## 2012-06-12 MED ORDER — ONDANSETRON HCL 4 MG/2ML IJ SOLN
INTRAMUSCULAR | Status: AC
  Filled 2012-06-12: qty 2

## 2012-06-12 MED ORDER — AMLODIPINE BESYLATE 10 MG PO TABS
10.0000 mg | ORAL_TABLET | Freq: Every day | ORAL | Status: DC
  Administered 2012-06-12 – 2012-06-13 (×2): 10 mg via ORAL
  Filled 2012-06-12 (×3): qty 1

## 2012-06-12 MED ORDER — PENTAFLUOROPROP-TETRAFLUOROETH EX AERO: 1.0000 "application " | INHALATION_SPRAY | CUTANEOUS | Status: DC | PRN

## 2012-06-12 MED ORDER — ALBUTEROL SULFATE (5 MG/ML) 0.5% IN NEBU
5.0000 mg | INHALATION_SOLUTION | Freq: Once | RESPIRATORY_TRACT | Status: AC
  Administered 2012-06-12: 5 mg via RESPIRATORY_TRACT
  Filled 2012-06-12: qty 1

## 2012-06-12 MED ORDER — INSULIN ASPART 100 UNIT/ML ~~LOC~~ SOLN
10.0000 [IU] | Freq: Once | SUBCUTANEOUS | Status: AC
  Administered 2012-06-12: 10 [IU] via INTRAVENOUS
  Filled 2012-06-12: qty 1

## 2012-06-12 MED ORDER — LOSARTAN POTASSIUM 50 MG PO TABS
100.0000 mg | ORAL_TABLET | Freq: Every day | ORAL | Status: DC
  Administered 2012-06-12 – 2012-06-13 (×2): 100 mg via ORAL
  Filled 2012-06-12 (×3): qty 2

## 2012-06-12 MED ORDER — NEPRO/CARBSTEADY PO LIQD: 237.0000 mL | ORAL | Status: DC | PRN

## 2012-06-12 NOTE — H&P (Signed)
Date: 06/12/2012               Patient Name:  Jeremy Mccoy MRN: [id number]  DOB: 04-Jun-1965 Age / Sex: 47 y.o., male   PCP: Pcp Not In System              Medical Service: Internal Medicine Teaching Service              Attending Physician: Dr. Murlean Caller    First Contact: Dr. Jeani Hawking Pager: [phone]  Second Contact: Dr. Doug Sou Pager: [phone]            After Hours (After 5p/  First Contact Pager: [phone]  weekends / holidays): Second Contact Pager: [phone]     Chief Complaint: SOB, fevers/chills  History of Present Illness: Patient is a 47 y.o. male with a PMHx of ESRD on HD, who presents to [facility] for evaluation of SOB, fevers/chills. The patient states the symptoms began yesterday, and were accompanied by a cough productive of yellow sputum. He also complains of diffuse myalgias. He complains of nausea and dry heaves since yesterday, but denies any actual vomiting. He complains of diarrhea for the last few days, which he describes as loose stools rather than watery diarrhea.  He is on a TTS hemodialysis schedule, and has not missed any recent dialysis appointments, however he has left approximately halfway through his previous three hemodialysis sessions secondary to headache. He states that he has not been vaccinated against the flu this year. To note, the patient is a known user of crack cocaine, and admits to using it as recently as yesterday evening.   Review of Systems: Per HPI.   Current Outpatient Medications: Medications Prior to Admission  Medication Sig Dispense Refill  . acetaminophen (TYLENOL) 500 MG tablet Take 1,000 mg by mouth every 6 (six) hours as needed. pain      . amLODipine (NORVASC) 10 MG tablet Take 10 mg by mouth daily.      Marland Kitchen aspirin 325 MG tablet Take 325 mg by mouth once.       . calcium carbonate (TUMS - DOSED IN MG ELEMENTAL CALCIUM) 500 MG chewable tablet Chew 2 tablets by mouth as needed. For indigestin      . HYDROcodone-acetaminophen  (NORCO/VICODIN) 5-325 MG per tablet Take 1 tablet by mouth every 4 (four) hours as needed. For pain      . hydrOXYzine (ATARAX/VISTARIL) 25 MG tablet Take 25 mg by mouth 3 (three) times daily as needed. itching      . losartan (COZAAR) 100 MG tablet Take 100 mg by mouth at bedtime.      . multivitamin (RENA-VIT) TABS tablet Take 1 tablet by mouth at bedtime.      . pantoprazole (PROTONIX) 40 MG tablet Take 40 mg by mouth daily at 12 noon.      . sevelamer (RENVELA) 800 MG tablet Take 1,600 mg by mouth 3 (three) times daily with meals.        Allergies: No Known Allergies   Past Medical History: Past Medical History  Diagnosis Date  . COPD (chronic obstructive pulmonary disease)   . Smoker   . Crack cocaine use   . Dental caries   . 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 Av fistula placement 11/29/2011    Procedure: ARTERIOVENOUS (AV) FISTULA CREATION;  Surgeon: Angelia Mould, MD;  Location: [facility] OR;  Service: Vascular;  Laterality: Left;    Family History: Family History  Problem Relation Age of Onset  . Heart attack Father     Social History: History   Social History  . Marital Status: Single    Spouse Name: N/A    Number of Children: N/A  . Years of Education: N/A   Occupational History  . Not on file.   Social History Main Topics  . Smoking status: Current Every Day Smoker -- 1.0 packs/day for 25 years    Types: Cigarettes  . Smokeless tobacco: Never Used  . Alcohol Use: No  . Drug Use: Yes    Special: Cocaine, Marijuana  . Sexually Active: No   Other Topics Concern  . Not on file   Social History Narrative  . No narrative on file     Vital Signs: Blood pressure 170/119, pulse 88, temperature 97.2 F (36.2 C), temperature source Rectal, resp. rate 24, SpO2 94.00%.  Physical Exam: General: Vital signs reviewed and noted.  Well-developed, well-nourished, in no acute distress; alert, appropriate and cooperative throughout examination.  Head: Normocephalic, atraumatic.  Eyes: PERRL, EOMI, No signs of anemia or jaundice.  Nose: Mucous membranes moist, not inflammed, nonerythematous.  Throat: Oropharynx nonerythematous, no exudate appreciated.   Neck: No deformities, masses, or tenderness noted.  Lungs:  Mildly increased WOB. Coarse breath sounds in all lung fields.   Heart: RRR. S1 and S2 normal without gallop, murmur, or rubs.  Abdomen:  BS normoactive. Soft, Nondistended, non-tender.  Extremities: 2+ pretibial edema bilaterally. LUE HD graft.  Neurologic: A&O X3, CN II - XII are grossly intact. Motor strength is 5/5 in the all 4 extremities, Sensations intact to light touch, Cerebellar signs negative.  Skin: No visible rashes, scars.   Lab results: Comprehensive Metabolic Panel:    Component Value Date/Time   NA 134* 06/12/2012 1006   K 8.4* 06/12/2012 1006   CL 104 06/12/2012 1006   CO2 28 04/16/2012 1256   BUN 81* 06/12/2012 1006   CREATININE 17.80* 06/12/2012 1006   GLUCOSE 91 06/12/2012 1006   CALCIUM 9.8 04/16/2012 1256   AST 13 06/12/2012 0948   ALT 8 06/12/2012 0948   ALKPHOS 50 06/12/2012 0948   BILITOT 0.2* 06/12/2012 0948   PROT 7.2 06/12/2012 0948   ALBUMIN 4.0 06/12/2012 0948    CBC:    Component Value Date/Time   WBC 8.1 06/12/2012 0948   HGB 11.2* 06/12/2012 1006   HCT 33.0* 06/12/2012 1006   PLT 121* 06/12/2012 0948   MCV 97.9 06/12/2012 0948   NEUTROABS 7.3 06/12/2012 0948   LYMPHSABS 0.6* 06/12/2012 0948   MONOABS 0.3 06/12/2012 0948   EOSABS 0.0 06/12/2012 0948   BASOSABS 0.0 06/12/2012 0948   Drugs of Abuse     Component Value Date/Time   LABOPIA NEGATIVE 11/05/2011 1441   COCAINSCRNUR POSITIVE* 11/05/2011 1441   LABBENZ NEGATIVE 11/05/2011 1441   AMPHETMU NEGATIVE 11/05/2011 1441     Troponin (Point of Care Test)  Basename 06/12/12 1004  TROPIPOC 0.02     Historical Labs: Lab  Results  Component Value Date   HGBA1C 5.4 11/04/2011    Lab Results  Component Value Date   TSH 1.512 11/05/2011     Imaging results:  Dg Chest 1 View  06/12/2012  *RADIOLOGY REPORT*  Clinical Data: Emesis  CHEST - 1 VIEW  Comparison: 04/17/2012  Findings: Extensive airspace disease is present throughout both lungs, affecting the entire right upper lobe,  medial right middle lobe, and left upper lobe.  Cardiomegaly.  No pneumothorax. Hyperaeration.  IMPRESSION: Extensive bilateral consolidation.   Original Report Authenticated By: Marybelle Killings, M.D.    Ct Head Wo Contrast  06/12/2012  *RADIOLOGY REPORT*  Clinical Data: Headache with nausea and vomiting  CT HEAD WITHOUT CONTRAST  Technique:  Contiguous axial images were obtained from the base of the skull through the vertex without contrast.  Comparison: None.  Findings: The ventricles are normal in size and configuration. There is no appreciable mass, hemorrhage, extra-axial fluid collection, or midline shift.  No focal gray-white compartment lesions are identified.  The bony calvarium appears intact.  The mastoid air cells are clear.  IMPRESSION: Study within normal limits.   Original Report Authenticated By: Lowella Grip, M.D.      Other results:  EKG (06/12/2012) - Normal Sinus Rhythm, regular rate of approximately 90 bpm, normal axis, ST segments: peaked T-waves    Assessment & Plan:  Pt is a 47 y.o. yo male with a PMHx of ESRD on HD who was admitted on 06/12/2012 with symptoms of SOB, who will be admitted for hyperkalemia and emergent ESRD.  Hyperkalemia - potassium on admission = 8.4. Likely secondary to inadequate hemodialysis sessions recently. EKG reveals peaked T waves but no widening of the QRS. The patient was given IV calcium gluconate, insulin, and albuterol nebulizers in the ED. - admit to tele - emergent HD  SOB - the patient has signs of hypervolemia, however his history of complaints of fever/chills and CXR findings  more consistent with bilateral consolidation than pulmonary edema or hot concerning for pneumonia. He in very mild respiratory distress on admission. The patient will receive emergent HD for hyperkalemia, per above. Repeat CXR will be performed after dialysis to better determine whether the CXR findings are more consistent with pneumonia or pulmonary edema. - emergent HD - repeat CXR (2v) after HD => start coverage for HCAP if revealing of consolidation/infiltrates  ESRD on HD - emergent HD for hyperkalemia, per above.  - HD per nephrology   Headache - Unclear if patient's complaints of headache with the last few sessions of HD are due to excessive volume removal during HD or are more consistent with symptoms associated with his recent febrile illness. Pt had CT head performed in the ED which was negative for hemorrhage.  - percocet PRN   DVT PPX - heparin  CODE STATUS -  full  CONSULTS PLACED - nephrology  DISPO - Disposition is deferred at this time, awaiting improvement of hyperkalemia, SOB.   Anticipated discharge in approximately 1-2 day(s).   The patient does have a current PCP (Pcp Not In System) and will not be requiring OPC follow-up after discharge.   The patient does not have transportation limitations that hinder transportation to clinic appointments.   Services Needed at time of discharge: TO [place]         Y = Yes, Blank = No PT:   OT:   RN:   Equipment:   Other:     Signed: Gae Gallop, MD  PGY-1, Internal Medicine Resident Pager: [phone] (7AM-5PM) 06/12/2012, 1:10 PM

## 2012-06-12 NOTE — Progress Notes (Signed)
Patient states that he has a sever headache and does not want nurse to ask questions for admission right now. Patient also refuses assessment

## 2012-06-12 NOTE — ED Provider Notes (Signed)
History     CSN: [id number]  Arrival date & time 06/12/12  [id number]   First MD Initiated Contact with Patient 06/12/12 [phone]      Chief Complaint  Patient presents with  . Nausea  . Emesis  . Generalized Body Aches    (Consider location/radiation/quality/duration/timing/severity/associated sxs/prior treatment) HPI Patient developed multiple episodes of vomiting and dry heaves onset last night. He vomited approximately 1 cup of blood. He presently complains of diffuse headache onset after hemodialysis 3 days ago. He did not stay for the entire hemodialysis session 3 days ago he also admits to mild shortness of breath. Admits to smoking cocaine last time last night. No other associated symptoms. No chest pain. No treatment prior to coming here. Last bowel movement yesterday, normal no abdominal pain. Past Medical History  Diagnosis Date  . COPD (chronic obstructive pulmonary disease)   . Smoker   . Crack cocaine use   . Dental caries   . 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 Av fistula placement 11/29/2011    Procedure: ARTERIOVENOUS (AV) FISTULA CREATION;  Surgeon: Angelia Mould, MD;  Location: [facility] OR;  Service: Vascular;  Laterality: Left;    Family History  Problem Relation Age of Onset  . Heart attack Father     History  Substance Use Topics  . Smoking status: Current Every Day Smoker -- 1.0 packs/day for 25 years    Types: Cigarettes  . Smokeless tobacco: Never Used  . Alcohol Use: No      Review of Systems  Respiratory: Positive for shortness of breath.   Gastrointestinal: Positive for vomiting.       Hematemesis  All other systems reviewed and are negative.    Allergies  Review of patient's allergies indicates no known allergies.  Home Medications   Current Outpatient Rx  Name  Route  Sig  Dispense  Refill  . ACETAMINOPHEN 500 MG PO TABS   Oral  Take 1,000 mg by mouth every 6 (six) hours as needed. pain         . AMLODIPINE BESYLATE 10 MG PO TABS   Oral   Take 10 mg by mouth daily.         . ASPIRIN 325 MG PO TABS   Oral   Take 325 mg by mouth once.          Marland Kitchen CALCIUM CARBONATE ANTACID 500 MG PO CHEW   Oral   Chew 2 tablets by mouth as needed. For indigestin         . HYDROCODONE-ACETAMINOPHEN 5-325 MG PO TABS   Oral   Take 1 tablet by mouth every 4 (four) hours as needed. For pain         . HYDROXYZINE HCL 25 MG PO TABS   Oral   Take 25 mg by mouth 3 (three) times daily as needed. itching         . LOSARTAN POTASSIUM 100 MG PO TABS   Oral   Take 100 mg by mouth at bedtime.         Marland Kitchen RENA-VITE PO TABS   Oral   Take 1 tablet by mouth at bedtime.         Marland Kitchen PANTOPRAZOLE SODIUM 40 MG PO TBEC   Oral   Take 40 mg by mouth daily at 12 noon.         Marland Kitchen SEVELAMER CARBONATE  800 MG PO TABS   Oral   Take 1,600 mg by mouth 3 (three) times daily with meals.           BP 186/114  Pulse 99  Temp 97.7 F (36.5 C) (Oral)  Resp 22  SpO2 97%  Physical Exam  Nursing note and vitals reviewed. Constitutional: He appears well-developed and well-nourished. He appears distressed.       Mildly dyspneic  HENT:  Head: Normocephalic and atraumatic.       Dry blood on the tongue  Eyes: Conjunctivae normal are normal. Pupils are equal, round, and reactive to light.  Neck: Neck supple. No tracheal deviation present. No thyromegaly present.  Cardiovascular: Normal rate and regular rhythm.   No murmur heard. Pulmonary/Chest: Effort normal and breath sounds normal.       Mildly tachypneic  Abdominal: Soft. Bowel sounds are normal. He exhibits no distension. There is no tenderness.  Genitourinary: Rectum normal. Guaiac negative stool.       Normal tone brown stool  Musculoskeletal: Normal range of motion. He exhibits no edema and no tenderness.       Left upper extremity with dialysis graft with good thrill    Neurological: He is alert. Coordination normal.  Skin: Skin is warm and dry. No rash noted.  Psychiatric: He has a normal mood and affect.    ED Course  Procedures (including critical care time)  Labs Reviewed - No data to display No results found.   No diagnosis found.   Date: 06/12/2012  Rate: 90  Rhythm: normal sinus rhythm  QRS Axis: left  Intervals: normal  ST/T Wave abnormalities: Peaked T waves across the precordium  Conduction Disutrbances:none  Narrative Interpretation:   Old EKG Reviewed: In comparison to tracing from 04/15/2012 peak T waves are new Chest x-ray reviewed by me Spoke with Dr. Jonnie Finner who will arrange for emergent dialysis. Requests admission to internal medicine service  Patient treated with D50 insulin, calcium gluconate. At 11:40 a.m. remains mildly dyspneic. MDM  I also spoke with Dr.Koller from the internal medicine service will arrange for admission Clinically patient likely and pulmonary edema to explain chest x-ray findings. Diagnosis #1 pulmonary edema #2 hypokalemia #3 renal failure #4 headache CRITICAL CARE Performed by: Orlie Dakin   Total critical care time: 50 minute  Critical care time was exclusive of separately billable procedures and treating other patients.  Critical care was necessary to treat or prevent imminent or life-threatening deterioration.  Critical care was time spent personally by me on the following activities: development of treatment plan with patient and/or surrogate as well as nursing, discussions with consultants, evaluation of patient's response to treatment, examination of patient, obtaining history from patient or surrogate, ordering and performing treatments and interventions, ordering and review of laboratory studies, ordering and review of radiographic studies, pulse oximetry and re-evaluation of patient's condition.     Orlie Dakin, MD 06/12/12 1146

## 2012-06-12 NOTE — ED Notes (Signed)
Pt presents to department for evaluation of nausea/vomiting bright red blood and diarrhea. Onset last night. Pt also states "throbbing" headache at the time. Abdomen soft and nontender. Bowel sounds present all quadrants. States he is scheduled for dialysis today. Pt is conscious alert and oriented x4.

## 2012-06-12 NOTE — Progress Notes (Signed)
Pt seen and examined, full note to follow. ESRD pt came to ED with SOB, hemoptysis and was found to have bilat pulm infiltrates on cxr and K of 8.4. Not in resp distress. Has been signing off early at outpt HD. We have written orders for urgent HD for high K and suspected pulm edema. To be admitted by medicine service.  Hx of continued cocaine abuse also.  Kelly Splinter  MD [facility] [phone] pgr    [phone] cell 06/12/2012, 12:53 PM

## 2012-06-12 NOTE — Progress Notes (Signed)
Potassium result of 7.5 communicated to Dr. Jonnie Finner, no new orders received, pt receiving HD because of Istat K of 8.4 in emergency room.

## 2012-06-12 NOTE — Consult Note (Signed)
Jeremy Mccoy 06/12/2012 Adele Milson D Requesting Physician:  Dr. Winfred Leeds    Reason for Consult:  Hyperkalemia and pulm edema HPI: The patient is a 47 y.o. year-old with ESRD (see hx below) on HD at [facility], continued cocaine abuse and chronic HA's presented to ED with HA, hemoptysis, body aches and N/V/D. CXR showed extensive bilateral lung consolidation. K+ was 8.4 but was an iSTAT.  We were called for urgent HD. He was treated with IV Ca, insulin and glucose by ED MD.   His main complaint is R sided throbbing HA. Gets it about once a week, nothing will help except vicodin or percocet. SOB better since started HD today. No fevers  ROS  no n/v/d  no abd pain   + crack cocaine use yesterday  + hemoptysis  no jt pain or rash   Past Medical History:  Past Medical History  Diagnosis Date  . COPD (chronic obstructive pulmonary disease)   . Smoker   . Crack cocaine use   . Dental caries   . 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 End stage renal disease 01/11/2012    Patient presented 11/04/11 to hospital with CP, wt loss and N/V. Creat was 10, admitted. 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 ANA was + 1:80, slightly high antiDS dna, +MPO and +pr3 Ab's (ANCA). Rec'd plasmapheresis x 7, IV cytoxan and pred taper. First HD was 11/17/11. 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 Pt d/c'd to do outpt HD and may have gotten 1-2 additional Cytoxan as OP, but this was stopped eventually since renal function didn't recover. Now gets HD TTS schedule at [facility].  L forearm AVF 11/19/11 by Dr. Scot Dock is current access.      Past Surgical History:  Past Surgical History  Procedure Date  . Av fistula placement 11/29/2011    Procedure: ARTERIOVENOUS (AV) FISTULA CREATION;  Surgeon: Angelia Mould,  MD;  Location: [facility] OR;  Service: Vascular;  Laterality: Left;    Family History:  Family History  Problem Relation Age of Onset  . Heart attack Father    Social History:  reports that he has been smoking Cigarettes.  He has a 25 pack-year smoking history. He has never used smokeless tobacco. He reports that he uses illicit drugs (Cocaine and Marijuana). He reports that he does not drink alcohol.  Allergies: No Known Allergies  Home medications: Prior to Admission medications   Medication Sig Start Date End Date Taking? Authorizing Provider  acetaminophen (TYLENOL) 500 MG tablet Take 1,000 mg by mouth every 6 (six) hours as needed. pain   Yes Historical Provider, MD  amLODipine (NORVASC) 10 MG tablet Take 10 mg by mouth daily.   Yes Historical Provider, MD  aspirin 325 MG tablet Take 325 mg by mouth once.    Yes Historical Provider, MD  calcium carbonate (TUMS - DOSED IN MG ELEMENTAL CALCIUM) 500 MG chewable tablet Chew 2 tablets by mouth as needed. For indigestin 11/29/11 11/28/12 Yes Myriam Jacobson, PA  HYDROcodone-acetaminophen (NORCO/VICODIN) 5-325 MG per tablet Take 1 tablet by mouth every 4 (four) hours as needed. For pain 02/28/12  Yes Shari A Upstill, PA-C  hydrOXYzine (ATARAX/VISTARIL) 25 MG tablet Take 25 mg by mouth 3 (three) times daily as needed. itching   Yes Historical Provider, MD  losartan (COZAAR) 100  MG tablet Take 100 mg by mouth at bedtime.   Yes Historical Provider, MD  multivitamin (RENA-VIT) TABS tablet Take 1 tablet by mouth at bedtime. 11/29/11  Yes Myriam Jacobson, PA  pantoprazole (PROTONIX) 40 MG tablet Take 40 mg by mouth daily at 12 noon. 11/29/11 11/28/12 Yes Myriam Jacobson, PA  sevelamer (RENVELA) 800 MG tablet Take 1,600 mg by mouth 3 (three) times daily with meals. 11/29/11 11/28/12 Yes Myriam Jacobson, PA    Labs: Basic Metabolic Panel:  Lab [id number] 1006  NA 134*  K 8.4*  CL 104  CO2 --  GLUCOSE 91  BUN 81*  CREATININE 17.80*  ALB --    CALCIUM --  PHOS --   Liver Function Tests:  Lab 06/12/12 0948  AST 13  ALT 8  ALKPHOS 50  BILITOT 0.2*  PROT 7.2  ALBUMIN 4.0   No results found for this basename: LIPASE:3,AMYLASE:3 in the last 168 hours No results found for this basename: AMMONIA:3 in the last 168 hours CBC:  Lab 06/12/12 1006 06/12/12 0948  WBC -- 8.1  NEUTROABS -- 7.3  HGB 11.2* 10.9*  HCT 33.0* 32.9*  MCV -- 97.9  PLT -- 121*    Physical Exam:  Blood pressure 184/120, pulse 100, temperature 98.2 F (36.8 C), temperature source Oral, resp. rate 20, weight 79.7 kg (175 lb 11.3 oz), SpO2 93.00%.  Gen: awake, alert, no distress HEENT:  EOMI, sclera anicteric, throat clear and dryn Neck: no JVD, no LAN Chest: rales L base greater than R and anteriorly CV: regular, no rub or gallop, tachy, no sig murmur, no carotid or femoral bruits, pedal pulses intact Abdomen: soft, nontender, no ascites, scaphoid Ext: no LE or UE edema , no joint effusion or deformity, no gangrene or ulceration Neuro: alert, Ox3, no focal deficit Access: L forearm AVF patent  Outpatient HD: [facility], TTS, 4 hr 15 min. 180NRe Opti, 450/A1.5, bath 2K/2.25Ca, no profile. EDW 73.5 kg. AVF L forearm. PTH 253 (1/23), Hectorol 2 ug. EPO - none. Venofer 100/wk thru 2/18. Heparin 7400u.   Impression/Plan 1. Hyperkalemia Acute HD, low K bath  2. Hemoptysis / pulm infiltrates Patient looks better than CXR. Not sure if this is all edema, will dialyze and pull volume as tolerated, repeat CXR in am. No heparin HD due to hemoptysis.   3. ESRD Cont TTS hd  4. HTN/volume BP high, 6kg over dry weight Max UF w HD today Cont BP meds (ARB, CCB)  5. Anemia of CKD No EPO at center Hb 11, observe  6. Sec HPT Cont Renvela, vit D PTH 253 recently  7.  Cocaine abuse Per primary   Kelly Splinter  MD [facility] [phone] pgr    [phone] cell 06/12/2012, 1:39 PM

## 2012-06-12 NOTE — Progress Notes (Signed)
ANTIBIOTIC CONSULT NOTE - INITIAL  Pharmacy Consult for Vancomycin and Cefepime Indication: suspected HCAP  No Known Allergies  Patient Measurements: Height: 6' (182.9 cm) Weight: 172 lb 2.9 oz (78.1 kg) IBW/kg (Calculated) : 77.6  Adjusted Body Weight: n/a  Vital Signs: Temp: 98 F (36.7 C) (01/28 2011) Temp src: Oral (01/28 2011) BP: 179/110 mmHg (01/28 2100) Pulse Rate: 92  (01/28 2200) Intake/Output from previous day:   Intake/Output from this shift:    Labs:  Basename 06/12/12 2000 06/12/12 1302 06/12/12 1006 06/12/12 0948  WBC -- -- -- 8.1  HGB -- -- 11.2* 10.9*  PLT -- -- -- 121*  LABCREA -- -- -- --  CREATININE 9.69* 17.54* 17.80* --   Estimated Creatinine Clearance: 10.5 ml/min (by C-G formula based on Cr of 9.69). No results found for this basename: VANCOTROUGH:2,VANCOPEAK:2,VANCORANDOM:2,GENTTROUGH:2,GENTPEAK:2,GENTRANDOM:2,TOBRATROUGH:2,TOBRAPEAK:2,TOBRARND:2,AMIKACINPEAK:2,AMIKACINTROU:2,AMIKACIN:2, in the last 72 hours   Microbiology: Recent Results (from the past 720 hour(s))  MRSA PCR SCREENING     Status: Normal   Collection Time   06/12/12  5:49 PM      Component Value Range Status Comment   MRSA by PCR NEGATIVE  NEGATIVE Final     Medical History: Past Medical History  Diagnosis Date  . COPD (chronic obstructive pulmonary disease)   . Smoker   . Crack cocaine use   . Dental caries   . End stage renal disease 01/11/2012    Patient presented 11/04/11 to hospital with CP, wt loss and N/V. Creat was 10, admitted. 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 ANA was + 1:80, +MPO and +pr3 Ab's (ANCA). Received plasmapheresis x 7, IV cytoxan and pred taper. First HD was 11/17/11. 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 Pt d/c'd to do outpt HD and may have gotten 1-2 additional Cytoxan as OP, but this was stopped eventually since  renal function didn't recover. Now gets HD TTS schedule at [facility].  L forearm AVF 11/19/11 by Dr. Scot Dock is current access.    Marland Kitchen HTN (hypertension) 06/12/2012    Medications:  Scheduled:    . [COMPLETED] albuterol  5 mg Nebulization Once  . amLODipine  10 mg Oral Daily  . calcium carbonate  2 tablet Oral TID WC  . [COMPLETED] calcium gluconate 1 GM IV  1 g Intravenous Once  . [EXPIRED] calcium gluconate      . [COMPLETED] dextrose  1 ampule Intravenous Once  . doxercalciferol  2 mcg Intravenous Q T,Th,Sa-HD  . enoxaparin (LOVENOX) injection  30 mg Subcutaneous Q24H  . ferric gluconate (FERRLECIT/NULECIT) IV  62.5 mg Intravenous Q T,Th,Sa-HD  . [COMPLETED] HYDROcodone-acetaminophen      . [COMPLETED] insulin aspart  10 Units Intravenous Once  . losartan  100 mg Oral QHS  . multivitamin  1 tablet Oral QHS  . [COMPLETED] ondansetron      . oseltamivir  75 mg Oral Q48H  . pantoprazole  40 mg Oral Q1200  . sevelamer carbonate  1,600 mg Oral TID WC  . [EXPIRED] sodium chloride      . sodium chloride  3 mL Intravenous Q12H  . [DISCONTINUED] nitroGLYCERIN  2 inch Topical Once   Assessment: 47 yo male with ESRD admitted with hyperkalemia and pulmonary edema.  Pharmacy asked to begin empiric vancomycin and cefepime for suspected HCAP.  CXR with extensive bilateral consolidation.  S/p urgent HD today.  Goal of Therapy:  Vancomycin  trough level 15-20 mcg/ml  Plan:  1. Vancomycin 1750 mg IV x 1 now, then vancomycin 1g q HD. 2. Cefepime 2g IV x 1 now, then will need cefepime 2g IV qHD. 3. Will need to monitor HD schedule closely and re-dose as appropriate. 4. F/u cultures.  Kyleigh Nannini C 06/12/2012,10:36 PM

## 2012-06-12 NOTE — Progress Notes (Signed)
MEDICATION RELATED CONSULT NOTE - INITIAL   Pharmacy Consult for May adjust antibiotics/antivirals   No Known Allergies  Patient Measurements: Weight: 165 lb 9.1 oz (75.1 kg)    Labs:  Basename 06/12/12 1302 06/12/12 1006 06/12/12 0948  WBC -- -- 8.1  HGB -- 11.2* 10.9*  HCT -- 33.0* 32.9*  PLT -- -- 121*  APTT -- -- 45*  CREATININE 17.54* 17.80* --  LABCREA -- -- --  CREATININE 17.54* 17.80* --  CREAT24HRUR -- -- --  MG -- -- --  PHOS 5.6* -- --  ALBUMIN 3.7 -- 4.0  PROT -- -- 7.2  ALBUMIN 3.7 -- 4.0  AST -- -- 13  ALT -- -- 8  ALKPHOS -- -- 50  BILITOT -- -- 0.2*  BILIDIR -- -- 0.1  IBILI -- -- 0.1*   The CrCl is unknown because both a height and weight (above a minimum accepted value) are required for this calculation.   Microbiology: No results found for this or any previous visit (from the past 720 hour(s)).   Assessment: 47 yo male with ESRD admitted with hyperkalemia and pulmonary edema.  Started on Tamiflu 75 mg q 48 hrs for r/o influenza.  Pharmacy asked to dose adjust if needed, however dose is appropriate for this HD patient.  No antibiotics ordered currently.  Plan:  Continue Tamiflu as ordered.  Pharmacy will continue to follow for the possible addition of antibiotics. Thanks!  Mohmmad Saleeby C 06/12/2012,7:30 PM

## 2012-06-12 NOTE — ED Notes (Signed)
Pt returned to exam room, lab at bedside attempting to draw.

## 2012-06-12 NOTE — ED Notes (Signed)
Pt transported to x-ray and CT scan.

## 2012-06-12 NOTE — ED Notes (Addendum)
Pt presents to department via [facility] for evaluation of N/V/D, body aches, headache, and throwing up bright red blood. Onset last night at home. HD patient, receives treatments on Tues, Thur, Sat. 20g RAC. Received 4mg  zofran per EMS. Pt is conscious alert and oriented x4. CBG 94. BP 180/98.

## 2012-06-12 NOTE — ED Notes (Signed)
Results of chem 8 given to Dr. Lenna Sciara.

## 2012-06-12 NOTE — ED Notes (Signed)
Report given to Caryl Pina, RN in hemodialysis. Pt to be transported on bed for treatment.

## 2012-06-12 NOTE — ED Notes (Signed)
Pt transported to hemodialysis  

## 2012-06-12 NOTE — Procedures (Signed)
I was present at this dialysis session. I have reviewed the session itself and made appropriate changes.   Kelly Splinter, MD [employer] 06/12/2012, 2:21 PM

## 2012-06-13 ENCOUNTER — Inpatient Hospital Stay (HOSPITAL_COMMUNITY): Payer: Medicare Other

## 2012-06-13 DIAGNOSIS — E875 Hyperkalemia: Secondary | ICD-10-CM

## 2012-06-13 DIAGNOSIS — J811 Chronic pulmonary edema: Principal | ICD-10-CM

## 2012-06-13 DIAGNOSIS — F1910 Other psychoactive substance abuse, uncomplicated: Secondary | ICD-10-CM

## 2012-06-13 DIAGNOSIS — F1410 Cocaine abuse, uncomplicated: Secondary | ICD-10-CM

## 2012-06-13 DIAGNOSIS — I10 Essential (primary) hypertension: Secondary | ICD-10-CM

## 2012-06-13 DIAGNOSIS — N186 End stage renal disease: Secondary | ICD-10-CM

## 2012-06-13 LAB — POTASSIUM: Potassium: 3.6 mEq/L (ref 3.5–5.1)

## 2012-06-13 LAB — RENAL FUNCTION PANEL
Albumin: 3.9 g/dL (ref 3.5–5.2)
Calcium: 9.6 mg/dL (ref 8.4–10.5)
Chloride: 95 mEq/L — ABNORMAL LOW (ref 96–112)
Creatinine, Ser: 11.4 mg/dL — ABNORMAL HIGH (ref 0.50–1.35)
GFR calc non Af Amer: 5 mL/min — ABNORMAL LOW (ref >90)
Sodium: 139 mEq/L (ref 135–145)

## 2012-06-13 LAB — HIV ANTIBODY (ROUTINE TESTING W REFLEX): HIV: NONREACTIVE

## 2012-06-13 LAB — CBC
MCH: 32.5 pg (ref 26.0–34.0)
MCV: 98.3 fL (ref 78.0–100.0)
Platelets: 135 10*3/uL — ABNORMAL LOW (ref 150–400)
RDW: 14.4 % (ref 11.5–15.5)

## 2012-06-13 MED ORDER — LIDOCAINE-PRILOCAINE 2.5-2.5 % EX CREA: 1.0000 "application " | TOPICAL_CREAM | CUTANEOUS | Status: DC | PRN

## 2012-06-13 MED ORDER — LIDOCAINE HCL (PF) 1 % IJ SOLN: 5.0000 mL | INTRAMUSCULAR | Status: DC | PRN

## 2012-06-13 MED ORDER — PENTAFLUOROPROP-TETRAFLUOROETH EX AERO: 1.0000 "application " | INHALATION_SPRAY | CUTANEOUS | Status: DC | PRN

## 2012-06-13 MED ORDER — HEPARIN SODIUM (PORCINE) 1000 UNIT/ML DIALYSIS: 1000.0000 [IU] | INTRAMUSCULAR | Status: DC | PRN

## 2012-06-13 MED ORDER — SODIUM CHLORIDE 0.9 % IV SOLN: 100.0000 mL | INTRAVENOUS | Status: DC | PRN

## 2012-06-13 MED ORDER — ALTEPLASE 2 MG IJ SOLR
2.0000 mg | Freq: Once | INTRAMUSCULAR | Status: DC | PRN
  Filled 2012-06-13: qty 2

## 2012-06-13 MED ORDER — HEPARIN SODIUM (PORCINE) 1000 UNIT/ML IJ SOLN
7200.0000 [IU] | Freq: Once | INTRAMUSCULAR | Status: AC
  Administered 2012-06-13: 7200 [IU] via INTRAVENOUS

## 2012-06-13 MED ORDER — NEPRO/CARBSTEADY PO LIQD: 237.0000 mL | ORAL | Status: DC | PRN

## 2012-06-13 MED ORDER — CLONIDINE HCL 0.1 MG PO TABS
0.1000 mg | ORAL_TABLET | Freq: Once | ORAL | Status: AC
  Administered 2012-06-13: 0.1 mg via ORAL
  Filled 2012-06-13: qty 1

## 2012-06-13 MED ORDER — HEPARIN SODIUM (PORCINE) 1000 UNIT/ML DIALYSIS: 20.0000 [IU]/kg | INTRAMUSCULAR | Status: DC | PRN

## 2012-06-13 NOTE — Progress Notes (Signed)
Subjective: No complaints  Objective Vital signs in last 24 hours: Filed Vitals:   06/12/12 2327 06/13/12 0000 06/13/12 0100 06/13/12 0417  BP:  147/104 164/99 151/95  Pulse: 98 86 95 95  Temp: 98.1 F (36.7 C)   98.2 F (36.8 C)  TempSrc: Oral   Oral  Resp: 18 19 19 19   Height:      Weight:      SpO2: 95% 95% 97% 95%   Weight change:   Intake/Output Summary (Last 24 hours) at 06/13/12 0807 Last data filed at 06/12/12 2317  Gross per 24 hour  Intake    550 ml  Output   4524 ml  Net  -3974 ml   Labs: Basic Metabolic Panel:  Lab [id number] 0455 06/12/12 2000 06/12/12 1302 06/12/12 1006  NA 139 135 136 134*  K 6.2* 6.0* 7.5* 8.4*  CL 95* 91* 94* 104  CO2 27 27 20  --  GLUCOSE 83 85 106* 91  BUN 43* 34* 81* 81*  CREATININE 11.40* 9.69* 17.54* 17.80*  ALB -- -- -- --  CALCIUM 9.6 9.9 9.5 --  PHOS 8.4* -- 5.6* --   Liver Function Tests:  Lab 06/13/12 0455 06/12/12 1302 06/12/12 0948  AST -- -- 13  ALT -- -- 8  ALKPHOS -- -- 50  BILITOT -- -- 0.2*  PROT -- -- 7.2  ALBUMIN 3.9 3.7 4.0   No results found for this basename: LIPASE:3,AMYLASE:3 in the last 168 hours No results found for this basename: AMMONIA:3 in the last 168 hours CBC:  Lab 06/13/12 0455 06/12/12 1006 06/12/12 0948  WBC 7.0 -- 8.1  NEUTROABS -- -- 7.3  HGB 11.3* 11.2* 10.9*  HCT 34.2* 33.0* 32.9*  MCV 98.3 -- 97.9  PLT 135* -- 121*    Physical Exam:  Blood pressure 151/95, pulse 95, temperature 98.2 F (36.8 C), temperature source Oral, resp. rate 19, height 6' (1.829 m), weight 78.1 kg (172 lb 2.9 oz), SpO2 95.00%.  Gen: awake, alert, no distress  HEENT: EOMI, sclera anicteric, throat clear and dryn  Neck: no JVD, no LAN  Chest: rales L base greater than R and anteriorly  CV: regular, no rub or gallop, tachy, no sig murmur, no carotid or femoral bruits, pedal pulses intact  Abdomen: soft, nontender, no ascites, scaphoid  Ext: no LE or UE edema , no joint effusion or deformity, no gangrene  or ulceration  Neuro: alert, Ox3, no focal deficit  Access: L forearm AVF patent   Outpatient HD: [facility], TTS, 4 hr 15 min. 180NRe Opti, 450/A1.5, bath 2K/2.25Ca, no profile. EDW 73.5 kg. AVF L forearm. PTH 253 (1/23), Hectorol 2 ug. EPO - none. Venofer 100/wk thru 2/18. Heparin 7400u.   Impression/Plan   1. Hyperkalemia  Better but still high HD 3 hr today  2. Hemoptysis / pulm infiltrates  F/U cxr pending today  3. ESRD  Cont TTS hd   4. HTN/volume  Cont BP meds (ARB, CCB)   5. Anemia of CKD  No EPO at center  Hb 11, observe   6. Sec HPT  Cont Renvela, vit D  PTH 253 recently   7. Cocaine abuse  Per primary  Kelly Splinter  MD [facility] [phone] pgr    [phone] cell 06/13/2012, 8:07 AM

## 2012-06-13 NOTE — Progress Notes (Signed)
Subjective:    Patient denies fever/chills overnight, and states his SOB is very much improved after HD yesterday.   Interval Events: No acute events.    Objective:    Vital Signs:   Temp:  [97.1 F (36.2 C)-98.2 F (36.8 C)] 98.2 F (36.8 C) (01/29 0417) Pulse Rate:  [80-110] 95  (01/29 0417) Resp:  [14-24] 19  (01/29 0417) BP: (147-186)/(95-129) 151/95 mmHg (01/29 0417) SpO2:  [88 %-100 %] 95 % (01/29 0417) Weight:  [165 lb 9.1 oz (75.1 kg)-175 lb 11.3 oz (79.7 kg)] 172 lb 2.9 oz (78.1 kg) (01/28 1745)    24-hour weight change: Weight change:   Intake/Output:   Intake/Output Summary (Last 24 hours) at 06/13/12 [id number] Last data filed at 06/12/12 2317  Gross per 24 hour  Intake    550 ml  Output   4524 ml  Net  -3974 ml      Physical Exam: General: Vital signs reviewed and noted. Well-developed, well-nourished, in no acute distress; alert, appropriate and cooperative throughout examination.  Lungs:  Normal respiratory effort. Coarse breath sounds bilaterally  Heart: RRR. S1 and S2 normal without gallop, murmur, or rubs.  Abdomen:  BS normoactive. Soft, Nondistended, non-tender.  Extremities: No pretibial edema.     Labs:  Basic Metabolic Panel:  Lab [id number] 0455 06/12/12 2000 06/12/12 1302 06/12/12 1006  NA 139 135 136 134*  K 6.2* 6.0* 7.5* 8.4*  CL 95* 91* 94* 104  CO2 27 27 20  --  GLUCOSE 83 85 106* 91  BUN 43* 34* 81* 81*  CREATININE 11.40* 9.69* 17.54* 17.80*  CALCIUM 9.6 9.9 9.5 --  MG -- -- -- --  PHOS 8.4* -- 5.6* --    Liver Function Tests:  Lab 06/13/12 0455 06/12/12 1302 06/12/12 0948  AST -- -- 13  ALT -- -- 8  ALKPHOS -- -- 50  BILITOT -- -- 0.2*  PROT -- -- 7.2  ALBUMIN 3.9 3.7 4.0   CBC:  Lab 06/13/12 0455 06/12/12 1006 06/12/12 0948  WBC 7.0 -- 8.1  NEUTROABS -- -- 7.3  HGB 11.3* 11.2* 10.9*  HCT 34.2* 33.0* 32.9*  MCV 98.3 -- 97.9  PLT 135* -- 121*   Microbiology: Results for orders placed during the hospital  encounter of 06/12/12  MRSA PCR SCREENING     Status: Normal   Collection Time   06/12/12  5:49 PM      Component Value Range Status Comment   MRSA by PCR NEGATIVE  NEGATIVE Final     Coagulation Studies:  Basename 06/12/12 0948  LABPROT 13.6  INR 1.05   Imaging: Dg Chest 1 View  06/12/2012  *RADIOLOGY REPORT*  Clinical Data: Emesis  CHEST - 1 VIEW  Comparison: 04/17/2012  Findings: Extensive airspace disease is present throughout both lungs, affecting the entire right upper lobe, medial right middle lobe, and left upper lobe.  Cardiomegaly.  No pneumothorax. Hyperaeration.  IMPRESSION: Extensive bilateral consolidation.   Original Report Authenticated By: Marybelle Killings, M.D.    Dg Chest 2 View  06/12/2012  *RADIOLOGY REPORT*  Clinical Data: 47 year old male with shortness of breath and vomiting.  CHEST - 2 VIEW  Comparison: 06/12/2012 and prior chest radiographs  Findings: Mild cardiomegaly again noted. Bilateral airspace opacities, right greater than left, again noted. There is no evidence of pleural effusion or pneumothorax. No acute bony abnormalities are present.  IMPRESSION: Bilateral airspace opacities again noted likely representing multifocal pneumonia.  Asymmetric edema is a consideration.  Mild cardiomegaly.  Original Report Authenticated By: Margarette Canada, M.D.    Ct Head Wo Contrast  06/12/2012  *RADIOLOGY REPORT*  Clinical Data: Headache with nausea and vomiting  CT HEAD WITHOUT CONTRAST  Technique:  Contiguous axial images were obtained from the base of the skull through the vertex without contrast.  Comparison: None.  Findings: The ventricles are normal in size and configuration. There is no appreciable mass, hemorrhage, extra-axial fluid collection, or midline shift.  No focal gray-white compartment lesions are identified.  The bony calvarium appears intact.  The mastoid air cells are clear.  IMPRESSION: Study within normal limits.   Original Report Authenticated By: Lowella Grip, M.D.        Medications:    Infusions:    Scheduled Medications:    . amLODipine  10 mg Oral Daily  . calcium carbonate  2 tablet Oral TID WC  . ceFEPime (MAXIPIME) IV  2 g Intravenous Q T,Th,Sa-HD  . doxercalciferol  2 mcg Intravenous Q T,Th,Sa-HD  . enoxaparin (LOVENOX) injection  30 mg Subcutaneous Q24H  . ferric gluconate (FERRLECIT/NULECIT) IV  62.5 mg Intravenous Q T,Th,Sa-HD  . losartan  100 mg Oral QHS  . multivitamin  1 tablet Oral QHS  . oseltamivir  75 mg Oral Q48H  . pantoprazole  40 mg Oral Q1200  . sevelamer carbonate  1,600 mg Oral TID WC  . sodium chloride  3 mL Intravenous Q12H  . vancomycin  1,000 mg Intravenous Q T,Th,Sa-HD    PRN Medications: sodium chloride, sodium chloride, acetaminophen, alteplase, feeding supplement (NEPRO CARB STEADY), heparin, heparin, HYDROcodone-acetaminophen, hydrOXYzine, lidocaine, lidocaine-prilocaine, ondansetron, ondansetron, oxyCODONE-acetaminophen, pentafluoroprop-tetrafluoroeth   Assessment/ Plan:   Pt is a 47 y.o. yo male with a PMHx of ESRD on HD who was admitted on 06/12/2012 with symptoms of SOB, who will be admitted for hyperkalemia and emergent ESRD.   Hyperkalemia - K = 6.1 this AM after receiving HD yesterday.  - 3 hr HD today  SOB - much improved. Repeat CXR after HD revealed improvement of bilateral infiltrates vs asymmetric edema. Aside from questionable CXR findings, patient's vitals (afebrile) and symptomatic improvement with HD suggest his SOB was more likely related to hypervolemia than PNA. Flu pcr negative.  - cont vanc, cefepime - consider repeat CXR after HD today, and consider d/c abx if further improved  ESRD on HD - per above, pt will undergo 3hr HD session today as K still slightly elevated and pt likely still hypervolemic.  - HD per nephrology   Headache - Chronic, per nephrology notes. Pt admits that the only meds that he would like to take and that would help are opioids, and states he  prefers oxycodone to hydrocodone.  - cont percocet PRN  DVT PPX - heparin  CODE STATUS - full  CONSULTS PLACED - nephrology  DISPO - Will consider d/c home today after receiving 3hr HD session. The patient does have a current PCP (Pcp Not In System) and will not be requiring OPC follow-up after discharge.  The patient does not have transportation limitations that hinder transportation to clinic appointments. Services Needed at time of discharge: Y = Yes, Blank = No  PT:    OT:    RN:    Equipment:    Other:       Length of Stay: 1 day(s)   Signed: Gae Gallop, MD  PGY-1, Internal Medicine Resident Pager: [phone] (7AM-5PM) 06/13/2012, 9:22 AM

## 2012-06-13 NOTE — H&P (Signed)
INTERNAL MEDICINE TEACHING SERVICE Attending Admission Note  Date: 06/13/2012  Patient name: Jeremy Mccoy  Medical record number: [id number]  Date of birth: [birth year]    I have seen and evaluated Jeremy Mccoy and discussed their care with the Residency Team.   47 year old male with a past medical history significant for end-stage renal disease, on hemodialysis Tuesday Thursday Saturday, COPD, crack cocaine substance abuse, presented due to fever, chills and shortness of breath. Patient states he's recently noticed a cough that has become productive. He admits that he has left his hemodialysis treatments early due to a headache. He does admit he continues to use crack cocaine. He admits to some streaky bloody sputum as well. He was noted to be afebrile on admission. He was hemodynamically stable. A CT of the brain was performed which was negative for any acute changes. Chest x-ray showed extensive bilateral consolidation with a possible component of pulmonary edema. He was noted to have a potassium of 8.4, and underwent dialysis treatment. EKG was concerning for peaking of T waves. And his potassium has decreased to 6.2 this morning. He is undergoing another hemodialysis treatment. There is strong suspicion that this is volume overload from not completing for hemodialysis treatments the past sessions. He has no leukocytosis. Blood cultures have been obtained and are no growth to date. Influenza PCR is negative. Repeat chest x-ray status post one dialysis session shows rapid improvement of bilateral pulmonary consolidation, but still concerning for underlying infiltrates. Physical Exam: Blood pressure 174/113, pulse 98, temperature 97.5 F (36.4 C), temperature source Oral, resp. rate 22, height 6' (1.829 m), weight 167 lb 5.3 oz (75.9 kg), SpO2 92.00%.  General: Vital signs reviewed and noted. Well-developed, well-nourished, in no acute distress; alert, appropriate and cooperative throughout  examination.  Head: Normocephalic, atraumatic.  Eyes: PERRL, EOMI, No signs of anemia or jaundince.  Nose: Mucous membranes moist, not inflammed, nonerythematous.  Throat: Oropharynx nonerythematous, no exudate appreciated.   Neck: No deformities, masses, or tenderness noted.Supple, No carotid Bruits, no JVD.  Lungs:   scattered expiratory wheeze bilateral, decreased breath sounds.   Heart: RRR. S1 and S2 normal without gallop, murmur, or rubs.  Abdomen:  BS normoactive. Soft, Nondistended, non-tender.  No masses or organomegaly.  Extremities:  1+ bilateral pitting edema.   Neurologic: A&O X3, CN II - XII are grossly intact. Motor strength is 5/5 in the all 4 extremities, Sensations intact to light touch, Cerebellar signs negative.  Skin: No visible rashes, scars.    Lab results: Results for orders placed during the hospital encounter of 06/12/12 (from the past 24 hour(s))  MRSA PCR SCREENING     Status: Normal   Collection Time   06/12/12  5:49 PM      Component Value Range   MRSA by PCR NEGATIVE  NEGATIVE  BASIC METABOLIC PANEL     Status: Abnormal   Collection Time   06/12/12  8:00 PM      Component Value Range   Sodium 135  135 - 145 mEq/L   Potassium 6.0 (*) 3.5 - 5.1 mEq/L   Chloride 91 (*) 96 - 112 mEq/L   CO2 27  19 - 32 mEq/L   Glucose, Bld 85  70 - 99 mg/dL   BUN 34 (*) 6 - 23 mg/dL   Creatinine, Ser 9.69 (*) 0.50 - 1.35 mg/dL   Calcium 9.9  8.4 - 10.5 mg/dL   GFR calc non Af Amer 6 (*) >90 mL/min   GFR calc  Af Amer 7 (*) >90 mL/min  HIV ANTIBODY (ROUTINE TESTING)     Status: Normal   Collection Time   06/12/12  8:00 PM      Component Value Range   HIV NON REACTIVE  NON REACTIVE  INFLUENZA PANEL BY PCR     Status: Normal   Collection Time   06/12/12 10:22 PM      Component Value Range   Influenza A By PCR NEGATIVE  NEGATIVE   Influenza B By PCR NEGATIVE  NEGATIVE   H1N1 flu by pcr NOT DETECTED  NOT DETECTED  RENAL FUNCTION PANEL     Status: Abnormal   Collection  Time   06/13/12  4:55 AM      Component Value Range   Sodium 139  135 - 145 mEq/L   Potassium 6.2 (*) 3.5 - 5.1 mEq/L   Chloride 95 (*) 96 - 112 mEq/L   CO2 27  19 - 32 mEq/L   Glucose, Bld 83  70 - 99 mg/dL   BUN 43 (*) 6 - 23 mg/dL   Creatinine, Ser 11.40 (*) 0.50 - 1.35 mg/dL   Calcium 9.6  8.4 - 10.5 mg/dL   Phosphorus 8.4 (*) 2.3 - 4.6 mg/dL   Albumin 3.9  3.5 - 5.2 g/dL   GFR calc non Af Amer 5 (*) >90 mL/min   GFR calc Af Amer 5 (*) >90 mL/min  CBC     Status: Abnormal   Collection Time   06/13/12  4:55 AM      Component Value Range   WBC 7.0  4.0 - 10.5 K/uL   RBC 3.48 (*) 4.22 - 5.81 MIL/uL   Hemoglobin 11.3 (*) 13.0 - 17.0 g/dL   HCT 34.2 (*) 39.0 - 52.0 %   MCV 98.3  78.0 - 100.0 fL   MCH 32.5  26.0 - 34.0 pg   MCHC 33.0  30.0 - 36.0 g/dL   RDW 14.4  11.5 - 15.5 %   Platelets 135 (*) 150 - 400 K/uL    Imaging results:  Dg Chest 1 View  06/12/2012  *RADIOLOGY REPORT*  Clinical Data: Emesis  CHEST - 1 VIEW  Comparison: 04/17/2012  Findings: Extensive airspace disease is present throughout both lungs, affecting the entire right upper lobe, medial right middle lobe, and left upper lobe.  Cardiomegaly.  No pneumothorax. Hyperaeration.  IMPRESSION: Extensive bilateral consolidation.   Original Report Authenticated By: Marybelle Killings, M.D.    Dg Chest 2 View  06/12/2012  *RADIOLOGY REPORT*  Clinical Data: 47 year old male with shortness of breath and vomiting.  CHEST - 2 VIEW  Comparison: 06/12/2012 and prior chest radiographs  Findings: Mild cardiomegaly again noted. Bilateral airspace opacities, right greater than left, again noted. There is no evidence of pleural effusion or pneumothorax. No acute bony abnormalities are present.  IMPRESSION: Bilateral airspace opacities again noted likely representing multifocal pneumonia.  Asymmetric edema is a consideration.  Mild cardiomegaly.   Original Report Authenticated By: Margarette Canada, M.D.    Ct Head Wo Contrast  06/12/2012   *RADIOLOGY REPORT*  Clinical Data: Headache with nausea and vomiting  CT HEAD WITHOUT CONTRAST  Technique:  Contiguous axial images were obtained from the base of the skull through the vertex without contrast.  Comparison: None.  Findings: The ventricles are normal in size and configuration. There is no appreciable mass, hemorrhage, extra-axial fluid collection, or midline shift.  No focal gray-white compartment lesions are identified.  The bony calvarium appears intact.  The  mastoid air cells are clear.  IMPRESSION: Study within normal limits.   Original Report Authenticated By: Lowella Grip, M.D.      Assessment and Plan: I agree with the formulated Assessment and Plan with the following changes:  48 year old male with a past medical history significant for end-stage renal disease, on hemodialysis Tuesday Thursday Saturday, COPD, crack cocaine substance abuse, presented due to fever, chills and shortness of breath. #1 pulmonary edema versus multifocal pneumonia: At this time the patient is undergoing additional hemodialysis treatment. He has no fever, no leukocytosis, and feels better this morning. I will repeat his chest x-ray post hemodialysis treatment and this shows marked improvement this is unlikely to be pneumonia and his symptoms are likely due to pulmonary edema from volume overload after missing adequate hemodialysis sessions. For now continue vancomycin and cefepime #2 headache: This is improved today. He complains of headaches after hemodialysis sessions. This may be secondary to volume changes. He will need further workup as an outpatient for possible neurology input. #3 ESRD: He is hemodynamically stable, being followed by nephrology, going currently to second hemodialysis treatment. His potassium is decreasing with a lower potassium bath.   Dominic Pea, DO 1/29/20141:37 PM

## 2012-06-14 LAB — CBC
Hemoglobin: 11 g/dL — ABNORMAL LOW (ref 13.0–17.0)
Platelets: 143 10*3/uL — ABNORMAL LOW (ref 150–400)
RBC: 3.41 MIL/uL — ABNORMAL LOW (ref 4.22–5.81)
WBC: 7 10*3/uL (ref 4.0–10.5)

## 2012-06-14 LAB — RENAL FUNCTION PANEL
CO2: 27 mEq/L (ref 19–32)
Chloride: 93 mEq/L — ABNORMAL LOW (ref 96–112)
GFR calc Af Amer: 6 mL/min — ABNORMAL LOW (ref >90)
GFR calc non Af Amer: 5 mL/min — ABNORMAL LOW (ref >90)
Glucose, Bld: 82 mg/dL (ref 70–99)
Potassium: 5.6 mEq/L — ABNORMAL HIGH (ref 3.5–5.1)
Sodium: 137 mEq/L (ref 135–145)

## 2012-06-14 LAB — MPO/PR-3 (ANCA) ANTIBODIES
Myeloperoxidase Abs: 82 AU/mL — ABNORMAL HIGH (ref <20)
Serine Protease 3: 583 AU/mL — ABNORMAL HIGH (ref <20)

## 2012-06-14 MED ORDER — DOXERCALCIFEROL 4 MCG/2ML IV SOLN
INTRAVENOUS | Status: AC
  Administered 2012-06-14: 2 ug via INTRAVENOUS
  Filled 2012-06-14: qty 2

## 2012-06-14 NOTE — Progress Notes (Signed)
Subjective:   Patient states he is feeling well this AM, and states his SOB/cough are resolved. No HA this AM during hemodialysis.   Interval Events: No acute events.   Objective:    Vital Signs:   Temp:  [97.1 F (36.2 C)-99.1 F (37.3 C)] 97.1 F (36.2 C) (01/30 0750) Pulse Rate:  [83-120] 120  (01/30 1000) Resp:  [15-22] 16  (01/30 0750) BP: (106-182)/(88-122) 143/100 mmHg (01/30 1034) SpO2:  [92 %-96 %] 94 % (01/30 0750) Weight:  [156 lb 8.4 oz (71 kg)-168 lb 6.4 oz (76.386 kg)] 156 lb 8.4 oz (71 kg) (01/30 0750) Last BM Date: 06/11/12  24-hour weight change: Weight change: -8 lb 6 oz (-3.8 kg)  Intake/Output:   Intake/Output Summary (Last 24 hours) at 06/14/12 1053 Last data filed at 06/13/12 1514  Gross per 24 hour  Intake      0 ml  Output   4000 ml  Net  -4000 ml      Physical Exam: General:  Vital signs reviewed and noted. Well-developed, well-nourished, in no acute distress; alert, appropriate and cooperative throughout examination.   Lungs:  Normal respiratory effort. Coarse breath sounds bilaterally   Heart:  RRR. S1 and S2 normal without gallop, murmur, or rubs.   Abdomen:  BS normoactive. Soft, Nondistended, non-tender.   Extremities:  No pretibial edema.       Labs:  Basic Metabolic Panel:  Lab [id number] 06/13/12 1423 06/13/12 0455 06/12/12 2000 06/12/12 1302 06/12/12 1006  NA 137 -- 139 135 136 134*  K 5.6* 3.6 6.2* 6.0* 7.5* --  CL 93* -- 95* 91* 94* 104  CO2 27 -- 27 27 20  --  GLUCOSE 82 -- 83 85 106* 91  BUN 48* -- 43* 34* 81* 81*  CREATININE 10.71* -- 11.40* 9.69* 17.54* 17.80*  CALCIUM 10.0 -- 9.6 9.9 -- --  MG -- -- -- -- -- --  PHOS 6.5* -- 8.4* -- 5.6* --    Liver Function Tests:  Lab 06/14/12 0849 06/13/12 0455 06/12/12 1302 06/12/12 0948  AST -- -- -- 13  ALT -- -- -- 8  ALKPHOS -- -- -- 50  BILITOT -- -- -- 0.2*  PROT -- -- -- 7.2  ALBUMIN 3.8 3.9 3.7 4.0   CBC:  Lab 06/14/12 0849 06/13/12 0455 06/12/12 1006  06/12/12 0948  WBC 7.0 7.0 -- 8.1  NEUTROABS -- -- -- 7.3  HGB 11.0* 11.3* 11.2* 10.9*  HCT 33.4* 34.2* 33.0* 32.9*  MCV 97.9 98.3 -- 97.9  PLT 143* 135* -- 121*   Microbiology: Results for orders placed during the hospital encounter of 06/12/12  MRSA PCR SCREENING     Status: Normal   Collection Time   06/12/12  5:49 PM      Component Value Range Status Comment   MRSA by PCR NEGATIVE  NEGATIVE Final   CULTURE, BLOOD (ROUTINE X 2)     Status: Normal (Preliminary result)   Collection Time   06/12/12  7:59 PM      Component Value Range Status Comment   Specimen Description BLOOD RIGHT FOREARM   Final    Special Requests BOTTLES DRAWN AEROBIC ONLY [facility]   Final    Culture  Setup Time 06/13/2012 00:41   Final    Culture     Final    Value:        BLOOD CULTURE RECEIVED NO GROWTH TO DATE CULTURE WILL BE HELD FOR 5 DAYS BEFORE ISSUING A FINAL  NEGATIVE REPORT   Report Status PENDING   Incomplete   CULTURE, BLOOD (ROUTINE X 2)     Status: Normal (Preliminary result)   Collection Time   06/12/12  8:19 PM      Component Value Range Status Comment   Specimen Description BLOOD RIGHT ARM   Final    Special Requests     Final    Value: BOTTLES DRAWN AEROBIC AND ANAEROBIC 10CC AER 5CC ANA   Culture  Setup Time 06/13/2012 00:41   Final    Culture     Final    Value:        BLOOD CULTURE RECEIVED NO GROWTH TO DATE CULTURE WILL BE HELD FOR 5 DAYS BEFORE ISSUING A FINAL NEGATIVE REPORT   Report Status PENDING   Incomplete     Coagulation Studies:  Basename 06/12/12 0948  LABPROT 13.6  INR 1.05   Imaging: Dg Chest 2 View  06/13/2012  *RADIOLOGY REPORT*  Clinical Data: Short of breath.  Edema or infiltrates.  Dialysis patient.  CHEST - 2 VIEW  Comparison: 06/12/2012  Findings: Bilateral airspace density persists but it is less dense than seen yesterday.  This could be due to improving edema or pneumonia.  No worsening or new findings.  No effusions.  Mild cardiomegaly again noted.  IMPRESSION:  Some radiographic improvement since yesterday with decreasing density of the bilateral airspace density.  Findings could be due to improving edema or pneumonia.   Original Report Authenticated By: Nelson Chimes, M.D.    Dg Chest 2 View  06/12/2012  *RADIOLOGY REPORT*  Clinical Data: 47 year old male with shortness of breath and vomiting.  CHEST - 2 VIEW  Comparison: 06/12/2012 and prior chest radiographs  Findings: Mild cardiomegaly again noted. Bilateral airspace opacities, right greater than left, again noted. There is no evidence of pleural effusion or pneumothorax. No acute bony abnormalities are present.  IMPRESSION: Bilateral airspace opacities again noted likely representing multifocal pneumonia.  Asymmetric edema is a consideration.  Mild cardiomegaly.   Original Report Authenticated By: Margarette Canada, M.D.        Medications:    Infusions:    Scheduled Medications:    . amLODipine  10 mg Oral Daily  . calcium carbonate  2 tablet Oral TID WC  . doxercalciferol  2 mcg Intravenous Q T,Th,Sa-HD  . enoxaparin (LOVENOX) injection  30 mg Subcutaneous Q24H  . ferric gluconate (FERRLECIT/NULECIT) IV  62.5 mg Intravenous Q T,Th,Sa-HD  . losartan  100 mg Oral QHS  . multivitamin  1 tablet Oral QHS  . pantoprazole  40 mg Oral Q1200  . sevelamer carbonate  1,600 mg Oral TID WC  . sodium chloride  3 mL Intravenous Q12H    PRN Medications: acetaminophen, HYDROcodone-acetaminophen, hydrOXYzine, ondansetron, ondansetron, oxyCODONE-acetaminophen   Assessment/ Plan:   Pt is a 47 y.o. yo male with a PMHx of ESRD on HD who was admitted on 06/12/2012 with symptoms of SOB, who will be admitted for hyperkalemia and emergent ESRD.   Hypervolemia/pulm edema - repeat CXR after 3h HD session yesterday afternoon revealed further improvement of pulmonary findings. No fever or leukocytosis. Very low suspicion for pneumonia given the improvement of SOB and CXR findings with serial dialysis sessions.  - d/c  home today  ESRD on HD - HD per nephrology. Recommend that pt be given small doses of ativan with dialysis sessions if his anxiety is resulting in inadequate HD 2/2 pt leaving prematurely.   Headache - likely related to crack cocaine  use, per patient's admission. Counseled on importance of cessation of this behavior. No narcotics to be prescribed at discharge, given pt's issue with active drug abuse and limited utility of such treatment.   DVT PPX - heparin  CODE STATUS - full  CONSULTS PLACED - nephrology  DISPO - D/c home today. Will resume TTS dialysis schedule. Recommend starting ativan with dialysis PRN, per above.  The patient does have a current PCP (Pcp Not In System) and will not be requiring OPC follow-up after discharge.  The patient does not have transportation limitations that hinder transportation to clinic appointments. Services Needed at time of discharge: TO [place] Y = Yes, Blank = No  PT:    OT:    RN:    Equipment:    Other:       Length of Stay: 2 day(s)   Signed: Gae Gallop, MD  PGY-1, Internal Medicine Resident Pager: [phone] (7AM-5PM) 06/14/2012, 10:53 AM

## 2012-06-14 NOTE — Procedures (Signed)
I was present at this dialysis session. I have reviewed the session itself and made appropriate changes.   Kelly Splinter, MD [employer] 06/14/2012, 1:06 PM

## 2012-06-14 NOTE — Progress Notes (Signed)
INTERNAL MEDICINE TEACHING SERVICE Attending Note  Date: 06/14/2012  Patient name: Jeremy Mccoy  Medical record number: [id number]  Date of birth: 05-Nov-1965    This patient has been seen and discussed with the house staff. Please see their note for complete details. I concur with their findings with the following additions/corrections:  Feeling better this morning. No shortness of breath or cough. Clinically this does not seem like pneumonia, since his radiographic improvement is improving with dialysis and removal of fluid. I favor resolving pulmonary edema in this case. He is not febrile, and has no leukocytosis. He should have a followup chest x-ray done as an outpatient if he has repeat pulmonary symptoms in the next weeks and a followup chest x-ray in 4 weeks to make sure there is no development of infiltrates, although this can be followed clinically. He admits to continue use of crack cocaine. This is likely causing some episodes of hemoptysis and may be explaining his headache. We counseled him on stopping the use of crack cocaine. He states he has an appointment with a pain clinic. At this time due to his continued drug use, I do not feel comfortable prescribing him narcotics and he understands this. Case was discussed with nephrology and this patient is medically stable for discharge.  Dominic Pea, DO  06/14/2012, 11:53 AM

## 2012-06-14 NOTE — Progress Notes (Signed)
Subjective: No complaints  Objective Vital signs in last 24 hours: Filed Vitals:   06/14/12 1027 06/14/12 1034 06/14/12 1100 06/14/12 1103  BP: 151/92 143/100 179/101 168/100  Pulse:  115 110 100  Temp:    97.4 F (36.3 C)  TempSrc:    Oral  Resp:    16  Height:      Weight:    68.4 kg (150 lb 12.7 oz)  SpO2:    94%   Weight change: -3.8 kg (-8 lb 6 oz)  Intake/Output Summary (Last 24 hours) at 06/14/12 1304 Last data filed at 06/14/12 1103  Gross per 24 hour  Intake      0 ml  Output   6135 ml  Net  -6135 ml   Labs: Basic Metabolic Panel:  Lab [id number] 06/13/12 1423 06/13/12 0455 06/12/12 2000 06/12/12 1302 06/12/12 1006  NA 137 -- 139 135 136 134*  K 5.6* 3.6 6.2* 6.0* 7.5* 8.4*  CL 93* -- 95* 91* 94* 104  CO2 27 -- 27 27 20  --  GLUCOSE 82 -- 83 85 106* 91  BUN 48* -- 43* 34* 81* 81*  CREATININE 10.71* -- 11.40* 9.69* 17.54* 17.80*  ALB -- -- -- -- -- --  CALCIUM 10.0 -- 9.6 9.9 9.5 --  PHOS 6.5* -- 8.4* -- 5.6* --   Liver Function Tests:  Lab 06/14/12 0849 06/13/12 0455 06/12/12 1302 06/12/12 0948  AST -- -- -- 13  ALT -- -- -- 8  ALKPHOS -- -- -- 50  BILITOT -- -- -- 0.2*  PROT -- -- -- 7.2  ALBUMIN 3.8 3.9 3.7 --   No results found for this basename: LIPASE:3,AMYLASE:3 in the last 168 hours No results found for this basename: AMMONIA:3 in the last 168 hours CBC:  Lab 06/14/12 0849 06/13/12 0455 06/12/12 1006 06/12/12 0948  WBC 7.0 7.0 -- 8.1  NEUTROABS -- -- -- 7.3  HGB 11.0* 11.3* 11.2* 10.9*  HCT 33.4* 34.2* 33.0* 32.9*  MCV 97.9 98.3 -- 97.9  PLT 143* 135* -- 121*    Physical Exam:  Blood pressure 168/100, pulse 100, temperature 97.4 F (36.3 C), temperature source Oral, resp. rate 16, height 6' (1.829 m), weight 68.4 kg (150 lb 12.7 oz), SpO2 94.00%.  Gen: awake, alert, no distress  HEENT: EOMI, sclera anicteric, throat clear and dryn  Neck: no JVD, no LAN  Chest: rales L base greater than R and anteriorly  CV: regular, no rub or  gallop, tachy, no sig murmur, no carotid or femoral bruits, pedal pulses intact  Abdomen: soft, nontender, no ascites, scaphoid  Ext: no LE or UE edema , no joint effusion or deformity, no gangrene or ulceration  Neuro: alert, Ox3, no focal deficit  Access: L forearm AVF patent   Outpatient HD: [facility], TTS, 4 hr 15 min. 180NRe Opti, 450/A1.5, bath 2K/2.25Ca, no profile. EDW 73.5 kg. AVF L forearm. PTH 253 (1/23), Hectorol 2 ug. EPO - none. Venofer 100/wk thru 2/18. Heparin 7400u.   Impression/Plan   1. Hyperkalemia- better, 5.6 today  2. Hemoptysis / pulm infiltrates  F/U cxr shows continued resolution of infiltrates over 48 hr period suggestion edema  3. ESRD  Cont TTS hd , HD today- OK for d/c after HD from renal standpoint  4. HTN/volume  Cont BP meds (ARB, CCB)   5. Anemia of CKD  No EPO at center  Hb 11, observe   6. Sec HPT  Cont Renvela, vit D  PTH 253  recently   7. Cocaine abuse  Per primary  Kelly Splinter  MD [facility] [phone] pgr    [phone] cell 06/14/2012, 1:04 PM

## 2012-06-14 NOTE — Progress Notes (Signed)
Pt was given discharged orders and was able to follow all instructions family here to take home knows follow up care

## 2012-06-16 NOTE — Discharge Summary (Signed)
Patient Name:  Jeremy Mccoy MRN: [id number]  PCP: Pcp Not In System DOB:  November 19, 1965       Date of Admission:  06/12/2012  Date of Discharge:  06/14/2012     Attending Physician: Dr. Murlean Caller         DISCHARGE DIAGNOSES: Hyperkalemia Hypervolemia  ESRD on HD Cocaine abuse CXR abnormalities   DISPOSITION AND FOLLOW-UP: Jeremy Mccoy is to follow-up with the listed providers as detailed below, at which time, the following should be addressed:   1. Assess patient's success in abstaining from cocaine use, which he admits is the etiology of his headaches which have contributed to him leaving hemodialysis early in multiple occasions, resulting in hypervolemia (with SOB) and hyperkalemia.  2. Determine if the patient's HD-related anxiety is improved with administration of lorazepam during HD sessions, as this issue appears to be another limiting factor in him being able to be adequately dialyzed.  3. Labs / imaging needed: N/A 4. Pending labs/ test needing follow-up: blood cultures (NGTD at discharge)   Discharge Orders    Future Orders Please Complete By Expires   Diet - low sodium heart healthy      Increase activity slowly          DISCHARGE MEDICATIONS:   Medication List     As of 06/16/2012 10:30 AM    TAKE these medications         acetaminophen 500 MG tablet   Commonly known as: TYLENOL   Take 1,000 mg by mouth every 6 (six) hours as needed. pain      amLODipine 10 MG tablet   Commonly known as: NORVASC   Take 10 mg by mouth daily.      aspirin 325 MG tablet   Take 325 mg by mouth once.      calcium carbonate 500 MG chewable tablet   Commonly known as: TUMS - dosed in mg elemental calcium   Chew 2 tablets by mouth as needed. For indigestin      HYDROcodone-acetaminophen 5-325 MG per tablet   Commonly known as: NORCO/VICODIN   Take 1 tablet by mouth every 4 (four) hours as needed. For pain      hydrOXYzine 25 MG tablet   Commonly known as:  ATARAX/VISTARIL   Take 25 mg by mouth 3 (three) times daily as needed. itching      losartan 100 MG tablet   Commonly known as: COZAAR   Take 100 mg by mouth at bedtime.      multivitamin Tabs tablet   Take 1 tablet by mouth at bedtime.      pantoprazole 40 MG tablet   Commonly known as: PROTONIX   Take 40 mg by mouth daily at 12 noon.      sevelamer carbonate 800 MG tablet   Commonly known as: RENVELA   Take 1,600 mg by mouth 3 (three) times daily with meals.         CONSULTS:  Nephrology  PROCEDURES PERFORMED:  Dg Chest 1 View  06/12/2012  *RADIOLOGY REPORT*  Clinical Data: Emesis  CHEST - 1 VIEW  Comparison: 04/17/2012  Findings: Extensive airspace disease is present throughout both lungs, affecting the entire right upper lobe, medial right middle lobe, and left upper lobe.  Cardiomegaly.  No pneumothorax. Hyperaeration.  IMPRESSION: Extensive bilateral consolidation.   Original Report Authenticated By: Jeremy Mccoy, M.D.    Dg Chest 2 View  06/13/2012  *RADIOLOGY REPORT*  Clinical Data: Short of  breath.  Edema or infiltrates.  Dialysis patient.  CHEST - 2 VIEW  Comparison: 06/12/2012  Findings: Bilateral airspace density persists but it is less dense than seen yesterday.  This could be due to improving edema or pneumonia.  No worsening or new findings.  No effusions.  Mild cardiomegaly again noted.  IMPRESSION: Some radiographic improvement since yesterday with decreasing density of the bilateral airspace density.  Findings could be due to improving edema or pneumonia.   Original Report Authenticated By: Jeremy Mccoy, M.D.    Dg Chest 2 View  06/12/2012  *RADIOLOGY REPORT*  Clinical Data: 47 year old male with shortness of breath and vomiting.  CHEST - 2 VIEW  Comparison: 06/12/2012 and prior chest radiographs  Findings: Mild cardiomegaly again noted. Bilateral airspace opacities, right greater than left, again noted. There is no evidence of pleural effusion or pneumothorax. No  acute bony abnormalities are present.  IMPRESSION: Bilateral airspace opacities again noted likely representing multifocal pneumonia.  Asymmetric edema is a consideration.  Mild cardiomegaly.   Original Report Authenticated By: Jeremy Mccoy, M.D.    Ct Head Wo Contrast  06/12/2012  *RADIOLOGY REPORT*  Clinical Data: Headache with nausea and vomiting  CT HEAD WITHOUT CONTRAST  Technique:  Contiguous axial images were obtained from the base of the skull through the vertex without contrast.  Comparison: None.  Findings: The ventricles are normal in size and configuration. There is no appreciable mass, hemorrhage, extra-axial fluid collection, or midline shift.  No focal gray-white compartment lesions are identified.  The bony calvarium appears intact.  The mastoid air cells are clear.  IMPRESSION: Study within normal limits.   Original Report Authenticated By: Lowella Grip, M.D.        ADMISSION DATA: H&P: Patient is a 47 y.o. male with a PMHx of ESRD on HD, who presents to [facility] for evaluation of SOB, fevers/chills. The patient states the symptoms began yesterday, and were accompanied by a cough productive of yellow sputum. He also complains of diffuse myalgias. He complains of nausea and dry heaves since yesterday, but denies any actual vomiting. He complains of diarrhea for the last few days, which he describes as loose stools rather than watery diarrhea.  He is on a TTS hemodialysis schedule, and has not missed any recent dialysis appointments, however he has left approximately halfway through his previous three hemodialysis sessions secondary to headache. He states that he has not been vaccinated against the flu this year. To note, the patient is a known user of crack cocaine, and admits to using it as recently as yesterday evening.   Physical Exam: General: Vital signs reviewed and noted. Well-developed, well-nourished, in no acute distress; alert, appropriate and cooperative throughout examination.    Head: Normocephalic, atraumatic.  Eyes: PERRL, EOMI, No signs of anemia or jaundice.  Nose: Mucous membranes moist, not inflammed, nonerythematous.  Throat: Oropharynx nonerythematous, no exudate appreciated.  Neck: No deformities, masses, or tenderness noted.  Lungs: Mildly increased WOB. Coarse breath sounds in all lung fields.  Heart: RRR. S1 and S2 normal without gallop, murmur, or rubs.  Abdomen: BS normoactive. Soft, Nondistended, non-tender.  Extremities: 2+ pretibial edema bilaterally. LUE HD graft.  Neurologic: A&O X3, CN II - XII are grossly intact. Motor strength is 5/5 in the all 4 extremities, Sensations intact to light touch, Cerebellar signs negative.  Skin: No visible rashes, scars.    Labs: Comprehensive Metabolic Panel:    Component  Value  Date/Time    NA  134*  06/12/2012 1006  K  8.4*  06/12/2012 1006    CL  104  06/12/2012 1006    CO2  28  04/16/2012 1256    BUN  81*  06/12/2012 1006    CREATININE  17.80*  06/12/2012 1006    GLUCOSE  91  06/12/2012 1006    CALCIUM  9.8  04/16/2012 1256    AST  13  06/12/2012 0948    ALT  8  06/12/2012 0948    ALKPHOS  50  06/12/2012 0948    BILITOT  0.2*  06/12/2012 0948    PROT  7.2  06/12/2012 0948    ALBUMIN  4.0  06/12/2012 0948    CBC:    Component  Value  Date/Time    WBC  8.1  06/12/2012 0948    HGB  11.2*  06/12/2012 1006    HCT  33.0*  06/12/2012 1006    PLT  121*  06/12/2012 0948    MCV  97.9  06/12/2012 0948    NEUTROABS  7.3  06/12/2012 0948    LYMPHSABS  0.6*  06/12/2012 0948    MONOABS  0.3  06/12/2012 0948    EOSABS  0.0  06/12/2012 0948    BASOSABS  0.0  06/12/2012 0948    Drugs of Abuse    Component  Value  Date/Time    LABOPIA  NEGATIVE  11/05/2011 1441    COCAINSCRNUR  POSITIVE*  11/05/2011 1441    LABBENZ  NEGATIVE  11/05/2011 1441    AMPHETMU  NEGATIVE  11/05/2011 1441    Troponin (Point of Care Test)   Basename  06/12/12 1004   TROPIPOC  0.02    Historical Labs:  Lab Results   Component  Value  Date     HGBA1C  5.4  11/04/2011    Lab Results   Component  Value  Date    TSH  1.512  11/05/2011      HOSPITAL COURSE: Hyperkalemia - K = 8.4 on admission. EKG revealed peaked T-waves but no widening of the QRS above baseline. Patient was given insulin, albuterol, and IV calcium gluconate in the ED. Nephrology was consulted, and he was taken for emergent HD. His potassium that evening after HD was 6.0. He underwent an extra 3hr HD session on Wednesday, 1/29, day 2 of his hospital course, but otherwise remained on his TTS HD schedule during his course. K = 5.6 on 1/30, prior to his final round of HD before discharge.   Hypervolemia - initially the patient's presentation with complaints of fevers/chills, SOB, and cough with hemoptysis was deemed concerning for PNA given his CXR findings which were interpreted as bilateral consolidation. He was started on vanc and cefepime for treatment of presumed HCAP, and tamiflu was started due to complaints of fever/chills and refusal of flu vaccination this year. After HD, however, the patient's SOB and cough resolved and repeat CXR was much improved, making pulmonary edema much more likely. Patient was afebrile throughout his hospital course. Antibiotics and tamiflu were discontinued prior to discharge.   ESRD on HD - issues with compliance with HD seem to stem from patient's admitted continuing crack cocaine abuse. During his hospital course he was followed by Dr. Jonnie Finner, and had extra HD to improve the aforementioned issues. Given that his headaches and anxiety seem to limit his ability to participate in his full TTS HD sessions as an outpatient, he may benefit from lorazepam given during these sessions in order to avoid complications such as  hyperkalemia and hypervolemia which result in admission requiring emergent HD.   Cocaine abuse - ongoing crack cocaine abuse--admits to using crack the day prior to admission. Patient's states he is willing to abstain, however he  is not convincing when relating this. Suspect this issue is also the cause of his complaints of hemoptysis, which he has had in the past as well in association with crack cocaine abuse.   CXR abnormalities - patient's CXR findings on admission were concerning for bilateral pulmonary infiltrates vs pulmonary edema. Clinically, the findings improved and PNA was not ultimately suspected with serial CXRs, however the findings persisted to some degree, and are atypical of pulmonary edema alone. 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 Further evaluation may be warranted if these findings persist despite adequate HD. Consideration for treatment of HCAP may also be warranted if clinically indicated (SOB despite HD, etc.), however patient has no signs/symptoms of pneumonia at time of discharge.    DISCHARGE DATA: Vital Signs: BP 168/100  Pulse 100  Temp 97.4 F (36.3 C) (Oral)  Resp 16  Ht 6' (1.829 m)  Wt 150 lb 12.7 oz (68.4 kg)  BMI 20.45 kg/m2  SpO2 94%  Labs: No results found for this or any previous visit (from the past 24 hour(s)).   Time spent on discharge: 40 minutes  Services Ordered on Discharge: Y = Yes; Blank = No PT:   OT:   RN:   Equipment:   Other:     Signed: Gae Gallop, MD   PGY 1, Internal Medicine Resident 06/16/2012, 10:30 AM

## 2012-06-18 NOTE — Discharge Summary (Signed)
INTERNAL MEDICINE TEACHING SERVICE Attending Note  Date: 06/18/2012  Patient name: Jeremy Mccoy  Medical record number: [id number]  Date of birth: 1966-04-02    This patient has been seen and discussed with the house staff. Please see their note for complete details. I concur with their findings and plan.  Dominic Pea, DO  06/18/2012, 8:50 AM

## 2012-06-19 LAB — CULTURE, BLOOD (ROUTINE X 2): Culture: NO GROWTH

## 2012-09-20 ENCOUNTER — Encounter (HOSPITAL_COMMUNITY): Payer: Self-pay

## 2012-09-20 ENCOUNTER — Inpatient Hospital Stay (HOSPITAL_COMMUNITY)
Admission: EM | Admit: 2012-09-20 | Discharge: 2012-09-22 | DRG: 189 | Disposition: A | Discharge status: Home / Self Care | Payer: Medicare Other | Attending: Internal Medicine | Admitting: Internal Medicine

## 2012-09-20 ENCOUNTER — Emergency Department (HOSPITAL_COMMUNITY): Payer: Medicare Other

## 2012-09-20 DIAGNOSIS — F17200 Nicotine dependence, unspecified, uncomplicated: Secondary | ICD-10-CM | POA: Diagnosis present

## 2012-09-20 DIAGNOSIS — J4489 Other specified chronic obstructive pulmonary disease: Secondary | ICD-10-CM | POA: Diagnosis present

## 2012-09-20 DIAGNOSIS — N186 End stage renal disease: Secondary | ICD-10-CM

## 2012-09-20 DIAGNOSIS — N039 Chronic nephritic syndrome with unspecified morphologic changes: Secondary | ICD-10-CM | POA: Diagnosis present

## 2012-09-20 DIAGNOSIS — N189 Chronic kidney disease, unspecified: Secondary | ICD-10-CM | POA: Diagnosis present

## 2012-09-20 DIAGNOSIS — N2581 Secondary hyperparathyroidism of renal origin: Secondary | ICD-10-CM | POA: Diagnosis present

## 2012-09-20 DIAGNOSIS — R7989 Other specified abnormal findings of blood chemistry: Secondary | ICD-10-CM | POA: Diagnosis present

## 2012-09-20 DIAGNOSIS — D631 Anemia in chronic kidney disease: Secondary | ICD-10-CM | POA: Diagnosis present

## 2012-09-20 DIAGNOSIS — Z992 Dependence on renal dialysis: Secondary | ICD-10-CM

## 2012-09-20 DIAGNOSIS — F1410 Cocaine abuse, uncomplicated: Secondary | ICD-10-CM

## 2012-09-20 DIAGNOSIS — I10 Essential (primary) hypertension: Secondary | ICD-10-CM

## 2012-09-20 DIAGNOSIS — J449 Chronic obstructive pulmonary disease, unspecified: Secondary | ICD-10-CM | POA: Diagnosis present

## 2012-09-20 DIAGNOSIS — Z91158 Patient's noncompliance with renal dialysis for other reason: Secondary | ICD-10-CM

## 2012-09-20 DIAGNOSIS — E875 Hyperkalemia: Secondary | ICD-10-CM | POA: Diagnosis present

## 2012-09-20 DIAGNOSIS — Z9115 Patient's noncompliance with renal dialysis: Secondary | ICD-10-CM

## 2012-09-20 DIAGNOSIS — E8770 Fluid overload, unspecified: Secondary | ICD-10-CM

## 2012-09-20 DIAGNOSIS — E8779 Other fluid overload: Secondary | ICD-10-CM | POA: Diagnosis present

## 2012-09-20 DIAGNOSIS — R778 Other specified abnormalities of plasma proteins: Secondary | ICD-10-CM | POA: Diagnosis present

## 2012-09-20 DIAGNOSIS — J811 Chronic pulmonary edema: Principal | ICD-10-CM

## 2012-09-20 DIAGNOSIS — I120 Hypertensive chronic kidney disease with stage 5 chronic kidney disease or end stage renal disease: Secondary | ICD-10-CM | POA: Diagnosis present

## 2012-09-20 DIAGNOSIS — Z720 Tobacco use: Secondary | ICD-10-CM | POA: Diagnosis present

## 2012-09-20 DIAGNOSIS — R0602 Shortness of breath: Secondary | ICD-10-CM

## 2012-09-20 DIAGNOSIS — F1910 Other psychoactive substance abuse, uncomplicated: Secondary | ICD-10-CM

## 2012-09-20 DIAGNOSIS — R9431 Abnormal electrocardiogram [ECG] [EKG]: Secondary | ICD-10-CM

## 2012-09-20 LAB — BASIC METABOLIC PANEL
CO2: 19 mEq/L (ref 19–32)
Chloride: 87 mEq/L — ABNORMAL LOW (ref 96–112)
Chloride: 92 mEq/L — ABNORMAL LOW (ref 96–112)
GFR calc Af Amer: 2 mL/min — ABNORMAL LOW (ref >90)
GFR calc Af Amer: 5 mL/min — ABNORMAL LOW (ref >90)
GFR calc non Af Amer: 4 mL/min — ABNORMAL LOW (ref >90)
Potassium: >7.5 mEq/L (ref 3.5–5.1)
Potassium: 4.5 mEq/L (ref 3.5–5.1)
Sodium: 136 mEq/L (ref 135–145)
Sodium: 141 mEq/L (ref 135–145)

## 2012-09-20 LAB — CBC WITH DIFFERENTIAL/PLATELET
Eosinophils Absolute: 0 10*3/uL (ref 0.0–0.7)
HCT: 24 % — ABNORMAL LOW (ref 39.0–52.0)
Hemoglobin: 8.5 g/dL — ABNORMAL LOW (ref 13.0–17.0)
Lymphs Abs: 0.8 10*3/uL (ref 0.7–4.0)
MCH: 33.3 pg (ref 26.0–34.0)
Monocytes Absolute: 0.2 10*3/uL (ref 0.1–1.0)
Monocytes Relative: 2 % — ABNORMAL LOW (ref 3–12)
Neutro Abs: 7.5 10*3/uL (ref 1.7–7.7)
Neutrophils Relative %: 88 % — ABNORMAL HIGH (ref 43–77)
RBC: 2.55 MIL/uL — ABNORMAL LOW (ref 4.22–5.81)

## 2012-09-20 LAB — POCT I-STAT TROPONIN I: Troponin i, poc: 0.09 ng/mL (ref 0.00–0.08)

## 2012-09-20 LAB — CREATININE, SERUM: Creatinine, Ser: 13.33 mg/dL — ABNORMAL HIGH (ref 0.50–1.35)

## 2012-09-20 LAB — CBC
Hemoglobin: 8.7 g/dL — ABNORMAL LOW (ref 13.0–17.0)
MCH: 33.2 pg (ref 26.0–34.0)
MCHC: 34.5 g/dL (ref 30.0–36.0)
Platelets: 122 10*3/uL — ABNORMAL LOW (ref 150–400)
RDW: 15.2 % (ref 11.5–15.5)

## 2012-09-20 LAB — OCCULT BLOOD, POC DEVICE: Fecal Occult Bld: NEGATIVE

## 2012-09-20 MED ORDER — HEPARIN SODIUM (PORCINE) 1000 UNIT/ML DIALYSIS
7400.0000 [IU] | Freq: Once | INTRAMUSCULAR | Status: DC
  Filled 2012-09-20: qty 8

## 2012-09-20 MED ORDER — CALCIUM GLUCONATE 10 % IV SOLN
INTRAVENOUS | Status: AC
  Filled 2012-09-20: qty 10

## 2012-09-20 MED ORDER — ALTEPLASE 2 MG IJ SOLR
2.0000 mg | Freq: Once | INTRAMUSCULAR | Status: AC | PRN
  Filled 2012-09-20: qty 2

## 2012-09-20 MED ORDER — LOSARTAN POTASSIUM 50 MG PO TABS
100.0000 mg | ORAL_TABLET | Freq: Every day | ORAL | Status: DC
  Administered 2012-09-21: 100 mg via ORAL
  Filled 2012-09-20 (×4): qty 2

## 2012-09-20 MED ORDER — SODIUM BICARBONATE 8.4 % IV SOLN: 50.0000 meq | Freq: Once | INTRAVENOUS | Status: DC

## 2012-09-20 MED ORDER — PENTAFLUOROPROP-TETRAFLUOROETH EX AERO: 1.0000 "application " | INHALATION_SPRAY | CUTANEOUS | Status: DC | PRN

## 2012-09-20 MED ORDER — INSULIN ASPART 100 UNIT/ML ~~LOC~~ SOLN
5.0000 [IU] | Freq: Once | SUBCUTANEOUS | Status: AC
  Administered 2012-09-20: 5 [IU] via INTRAVENOUS
  Filled 2012-09-20: qty 1

## 2012-09-20 MED ORDER — CALCIUM CARBONATE ANTACID 500 MG PO CHEW
2.0000 | CHEWABLE_TABLET | ORAL | Status: DC | PRN
  Filled 2012-09-20: qty 2

## 2012-09-20 MED ORDER — SODIUM CHLORIDE 0.9 % IV SOLN
INTRAVENOUS | Status: AC
  Filled 2012-09-20: qty 100

## 2012-09-20 MED ORDER — HEPARIN SODIUM (PORCINE) 5000 UNIT/ML IJ SOLN
5000.0000 [IU] | Freq: Three times a day (TID) | INTRAMUSCULAR | Status: DC
  Filled 2012-09-20 (×3): qty 1

## 2012-09-20 MED ORDER — SODIUM CHLORIDE 0.9 % IV SOLN: 100.0000 mL | INTRAVENOUS | Status: DC | PRN

## 2012-09-20 MED ORDER — NEPRO/CARBSTEADY PO LIQD: 237.0000 mL | ORAL | Status: DC | PRN

## 2012-09-20 MED ORDER — HYDROMORPHONE HCL PF 1 MG/ML IJ SOLN
INTRAMUSCULAR | Status: AC
  Filled 2012-09-20: qty 1

## 2012-09-20 MED ORDER — HEPARIN SODIUM (PORCINE) 1000 UNIT/ML DIALYSIS
1000.0000 [IU] | INTRAMUSCULAR | Status: DC | PRN
  Filled 2012-09-20: qty 1

## 2012-09-20 MED ORDER — ACETAMINOPHEN 500 MG PO TABS
1000.0000 mg | ORAL_TABLET | Freq: Four times a day (QID) | ORAL | Status: DC | PRN
  Filled 2012-09-20: qty 2

## 2012-09-20 MED ORDER — HYDROMORPHONE HCL PF 1 MG/ML IJ SOLN
1.0000 mg | Freq: Once | INTRAMUSCULAR | Status: AC
  Administered 2012-09-20: 1 mg via INTRAVENOUS

## 2012-09-20 MED ORDER — LIDOCAINE HCL (PF) 1 % IJ SOLN: 5.0000 mL | INTRAMUSCULAR | Status: DC | PRN

## 2012-09-20 MED ORDER — CLONIDINE HCL 0.1 MG PO TABS
0.1000 mg | ORAL_TABLET | Freq: Once | ORAL | Status: AC
  Administered 2012-09-20: 0.1 mg via ORAL
  Filled 2012-09-20: qty 1

## 2012-09-20 MED ORDER — SODIUM CHLORIDE 0.9 % IV SOLN: 250.0000 mL | INTRAVENOUS | Status: DC | PRN

## 2012-09-20 MED ORDER — HYDRALAZINE HCL 20 MG/ML IJ SOLN
10.0000 mg | Freq: Once | INTRAMUSCULAR | Status: AC
  Administered 2012-09-20: 10 mg via INTRAVENOUS
  Filled 2012-09-20: qty 0.5

## 2012-09-20 MED ORDER — LIDOCAINE-PRILOCAINE 2.5-2.5 % EX CREA: 1.0000 "application " | TOPICAL_CREAM | CUTANEOUS | Status: DC | PRN

## 2012-09-20 MED ORDER — DEXTROSE 50 % IV SOLN
1.0000 | Freq: Once | INTRAVENOUS | Status: AC
  Administered 2012-09-20: 50 mL via INTRAVENOUS
  Filled 2012-09-20: qty 50

## 2012-09-20 MED ORDER — AMLODIPINE BESYLATE 10 MG PO TABS
10.0000 mg | ORAL_TABLET | Freq: Every day | ORAL | Status: DC
  Administered 2012-09-20: 10 mg via ORAL
  Filled 2012-09-20 (×5): qty 1

## 2012-09-20 MED ORDER — NICOTINE 14 MG/24HR TD PT24
14.0000 mg | MEDICATED_PATCH | Freq: Every day | TRANSDERMAL | Status: DC
  Administered 2012-09-21: 14 mg via TRANSDERMAL
  Filled 2012-09-20 (×4): qty 1

## 2012-09-20 MED ORDER — SODIUM CHLORIDE 0.9 % IJ SOLN: 3.0000 mL | Freq: Two times a day (BID) | INTRAMUSCULAR | Status: DC

## 2012-09-20 MED ORDER — SODIUM CHLORIDE 0.9 % IJ SOLN: 3.0000 mL | INTRAMUSCULAR | Status: DC | PRN

## 2012-09-20 MED ORDER — SODIUM CHLORIDE 0.9 % IV SOLN
1.0000 g | Freq: Once | INTRAVENOUS | Status: AC
  Administered 2012-09-20: 1 g via INTRAVENOUS
  Filled 2012-09-20: qty 10

## 2012-09-20 MED ORDER — DARBEPOETIN ALFA-POLYSORBATE 150 MCG/0.3ML IJ SOLN
INTRAMUSCULAR | Status: AC
  Filled 2012-09-20: qty 0.3

## 2012-09-20 MED ORDER — SEVELAMER CARBONATE 800 MG PO TABS
1600.0000 mg | ORAL_TABLET | Freq: Three times a day (TID) | ORAL | Status: DC
  Administered 2012-09-20 – 2012-09-21 (×4): 1600 mg via ORAL
  Filled 2012-09-20 (×11): qty 2

## 2012-09-20 MED ORDER — SODIUM CHLORIDE 0.9 % IJ SOLN
3.0000 mL | Freq: Two times a day (BID) | INTRAMUSCULAR | Status: DC
  Administered 2012-09-20 – 2012-09-21 (×3): 3 mL via INTRAVENOUS

## 2012-09-20 MED ORDER — ONDANSETRON HCL 4 MG/2ML IJ SOLN
4.0000 mg | Freq: Four times a day (QID) | INTRAMUSCULAR | Status: DC | PRN
  Filled 2012-09-20: qty 2

## 2012-09-20 MED ORDER — ALBUTEROL SULFATE (5 MG/ML) 0.5% IN NEBU
5.0000 mg | INHALATION_SOLUTION | Freq: Once | RESPIRATORY_TRACT | Status: AC
  Administered 2012-09-20: 5 mg via RESPIRATORY_TRACT
  Filled 2012-09-20: qty 1

## 2012-09-20 MED ORDER — ONDANSETRON HCL 4 MG PO TABS
4.0000 mg | ORAL_TABLET | Freq: Four times a day (QID) | ORAL | Status: DC | PRN
  Filled 2012-09-20: qty 1

## 2012-09-20 MED ORDER — DARBEPOETIN ALFA-POLYSORBATE 150 MCG/0.3ML IJ SOLN
150.0000 ug | INTRAMUSCULAR | Status: DC
  Administered 2012-09-20: 150 ug via INTRAVENOUS
  Filled 2012-09-20: qty 0.3

## 2012-09-20 MED ORDER — RENA-VITE PO TABS
1.0000 | ORAL_TABLET | Freq: Every day | ORAL | Status: DC
  Administered 2012-09-21: 1 via ORAL
  Filled 2012-09-20 (×4): qty 1

## 2012-09-20 MED ORDER — PANTOPRAZOLE SODIUM 40 MG PO TBEC
40.0000 mg | DELAYED_RELEASE_TABLET | Freq: Every day | ORAL | Status: DC
Start: 2012-09-20 — End: 2012-09-22
  Administered 2012-09-21: 40 mg via ORAL
  Filled 2012-09-20 (×3): qty 1

## 2012-09-20 MED ORDER — OXYCODONE-ACETAMINOPHEN 5-325 MG PO TABS
1.0000 | ORAL_TABLET | Freq: Four times a day (QID) | ORAL | Status: DC | PRN
  Administered 2012-09-20: 2 via ORAL
  Filled 2012-09-20: qty 2

## 2012-09-20 MED ORDER — HYDROXYZINE HCL 25 MG PO TABS
25.0000 mg | ORAL_TABLET | Freq: Three times a day (TID) | ORAL | Status: DC | PRN
  Filled 2012-09-20: qty 1

## 2012-09-20 NOTE — ED Notes (Signed)
Lattie Haw, PA at the bedside.

## 2012-09-20 NOTE — Progress Notes (Signed)
Received patient  From ED via stretcher at 1200.  Alert and oriented.  VS taken and recorded.  Physical assessment done and recorded.  AVF on left forearm positive for thrill and bruit.  Patient to Hemodialysis unit.

## 2012-09-20 NOTE — H&P (Signed)
[facility] History and Physical  Jeremy Mccoy [id number] DOB: 11-Jul-1965 DOA: 09/20/2012  Referring physician: Dr. Lita Mains PCP: Pcp Not In System   Chief Complaint: SOB, chest pain, nausea, vomiting  HPI: Jeremy Mccoy is a 47 y.o. male with PMH significant for cocaine abuse, tobacco abuse, HTN and ESRD; came to the hospital complaining of increase SOB and chest discomfort. He has missed HD treatment for a whole week now, reports is secondary to HA's at Dialysis center. Patient reports some nausea and vomiting as well. All his symptoms explained by volume overload and uremia. Denies Chills, Fever, melena, hematochezia or any other complaints. He reports continue use of cocaine actively. In ED potassium >7.5, Cr of 30 and BUN of 178; mild troponin elevation. TRH called to admit for further evaluation and treatment.  Review of Systems:  Negative except as mentioned on HPI.  Past Medical History  Diagnosis Date  . COPD (chronic obstructive pulmonary disease)   . Smoker   . Crack cocaine use   . Dental caries   . End stage renal disease 01/11/2012    Patient presented 11/04/11 to hospital with CP, wt loss and N/V. Creat was 10, admitted. 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 ANA was + 1:80, +MPO and +pr3 Ab's (ANCA). Received plasmapheresis x 7, IV cytoxan and pred taper. First HD was 11/17/11. 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 Pt d/c'd to do outpt HD and may have gotten 1-2 additional Cytoxan as OP, but this was stopped eventually since renal function didn't recover. Now gets HD TTS schedule at [facility]  L forearm AVF 11/19/11 by Dr. Scot Dock is current access.    Marland Kitchen HTN (hypertension) 06/12/2012   Past Surgical History  Procedure Laterality Date  . Av fistula placement  11/29/2011    Procedure: ARTERIOVENOUS (AV) FISTULA CREATION;  Surgeon:  Angelia Mould, MD;  Location: [place];  Service: Vascular;  Laterality: Left;   Social History:  reports that he has been smoking Cigarettes.  He has a 25 pack-year smoking history. He has never used smokeless tobacco. He reports that he uses illicit drugs (Cocaine and Marijuana). He reports that he does not drink alcohol. from home; denies needs of assistance with ADL's.   No Known Allergies  Family History  Problem Relation Age of Onset  . Heart attack Father     Prior to Admission medications   Medication Sig Start Date End Date Taking? Authorizing Provider  acetaminophen (TYLENOL) 500 MG tablet Take 1,000 mg by mouth every 6 (six) hours as needed. pain   Yes Historical Provider, MD  amLODipine (NORVASC) 10 MG tablet Take 10 mg by mouth daily.   Yes Historical Provider, MD  calcium carbonate (TUMS - DOSED IN MG ELEMENTAL CALCIUM) 500 MG chewable tablet Chew 2 tablets by mouth as needed. For indigestin 11/29/11 11/28/12 Yes Myriam Jacobson, PA-C  HYDROcodone-acetaminophen (NORCO/VICODIN) 5-325 MG per tablet Take 1 tablet by mouth every 4 (four) hours as needed. For pain 02/28/12  Yes Shari A Upstill, PA-C  hydrOXYzine (ATARAX/VISTARIL) 25 MG tablet Take 25 mg by mouth 3 (three) times daily as needed. itching   Yes Historical Provider, MD  losartan (COZAAR) 100 MG tablet Take 100 mg by mouth at bedtime.   Yes Historical Provider, MD  multivitamin (RENA-VIT) TABS tablet Take 1 tablet by mouth at bedtime. 11/29/11  Yes Jana Half  Reather Littler, PA-C  pantoprazole (PROTONIX) 40 MG tablet Take 40 mg by mouth daily at 12 noon. 11/29/11 11/28/12 Yes Myriam Jacobson, PA-C  sevelamer (RENVELA) 800 MG tablet Take 1,600 mg by mouth 3 (three) times daily with meals. 11/29/11 11/28/12 Yes Myriam Jacobson, PA-C   Physical Exam: Filed Vitals:   09/20/12 1129 09/20/12 1137 09/20/12 1215 09/20/12 1217  BP:  167/100  189/114  Pulse:  101  102  Temp:  97.6 F (36.4 C)  97.9 F (36.6 C)  TempSrc:  Oral   Oral  Resp:  18    Height:      Weight: 68.4 kg (150 lb 12.7 oz)   78.3 kg (172 lb 9.9 oz)  SpO2:  83% 78% 96%     General:  Mild SOB, some difficulty speaking in full sentences, on RA; no using accessory muscles.  Eyes: PERRL, EOMI, no icterus, no nystagmus  ENT: moist MM, no erythema, no exudates, negative drainage from ears or nostrils  Neck: supple, no thyromegaly, +JVD  Cardiovascular: s1 and S2, tachycardic, no murmurs  Respiratory: decrease air movement at basess, positive crackles  Abdomen: soft, NT, ND, positive BS  Skin: no rash, no petechiae  Musculoskeletal: trace of edema bilaterally  Psychiatric: appropiate  Neurologic: no focal deficit, CN intact, AAOX3; MS 5/5 blaterally  Labs on Admission:  Basic Metabolic Panel:  Recent Labs Lab 09/20/12 1011  NA 136  K >7.5*  CL 87*  CO2 19  GLUCOSE 88  BUN 178*  CREATININE 30.10*  CALCIUM 9.7   CBC:  Recent Labs Lab 09/20/12 0850  WBC 8.4  NEUTROABS 7.5  HGB 8.5*  HCT 24.0*  MCV 94.1  PLT 127*   BNP (last 3 results)  Recent Labs  11/04/11 1658 11/05/11 0430 04/15/12 0109  PROBNP 592.1* 499.7* >70000.0*   CBG:  Recent Labs Lab 09/20/12 0851  GLUCAP 84    Radiological Exams on Admission: Dg Chest Portable 1 View  09/20/2012  *RADIOLOGY REPORT*  Clinical Data: Smoker with current history of hypertension, presenting with shortness of breath.  PORTABLE CHEST - 1 VIEW 09/20/2012 0911 hours:  Comparison: Two-view chest x-ray 06/13/2012.  Portable chest x-ray 04/17/2012.  Findings: Patchy airspace opacities throughout both lungs, most confluent at the left lung base.  Cardiac silhouette enlarged but stable.  Pulmonary vascularity normal.  No visible pleural effusions.  IMPRESSION: Patchy pneumonia throughout both lungs, most confluent at the left lung base.   Original Report Authenticated By: Evangeline Dakin, M.D.     EKG: IVCD, RBBB, peak T waves; wide QRS.   Assessment/Plan 1-Shortness of  breath: with findings on x-ray suggesting PNA vs pulmonary edema. Patient has missed a week of HD tx and is non-toxic in appearance. Most likely pulmonary edema and volume overload as reason for SOB -nephrology consulted for immediate HD -hold on antibiotics for now -repeat CXR in am -NSL -since troponin were elevated will cycle CE'z, monitor on telemetry and check 2-D echo  2-Tobacco abuse: counseling provided. Will use nicotine patch  3-Pulmonary edema: to be treated with HD as mentioned above.  -oxygen supplementation -daily weights and strict I's and O's as part of treatemnt.  4-Volume excess:to be addressed with HD. -2-D echo to assess EF.  5-Abnormal EKG: most likely triggered by electrolytes abnormalities given hyperkalemia. -received albuterol, insulin, calcium gluconate and emergent HD. -will cycle CE'z and check 2-D echo  6-ESRD (end stage renal disease): renal service contacted. Will follow rec's. Usually T-T-S  7-Drug abuse:  counseling provided. Patient not ready for detox at this point.  8-Elevated troponin: as mentioned above; demand ischemia vs triggered by renal function/volume excess. -cycle enzymes -telemetry -2-D echo -HD  9-Anemia in chronic kidney disease: aranesp per renal. Will guaiac stool. No signs of acute bleeding. Will follow Hgb trend.  DVT: heparin     Nephrology (Dr. Melvia Heaps)  Code Status: Full Family Communication: no family at bedside Disposition Plan: telemetry, inpatient LOs > 2 midnights  Time spent: >30 minutes  Maruice Pieroni [facility] Pager [phone]  If 7PM-7AM, please contact night-coverage [email or web address] Password [facility] 09/20/2012, 12:32 PM

## 2012-09-20 NOTE — ED Provider Notes (Signed)
History     CSN: [id number]  Arrival date & time 09/20/12  [id number]   First MD Initiated Contact with Patient 09/20/12 [phone]      Chief Complaint  Patient presents with  . Shortness of Breath  . no dialysis in a week     (Consider location/radiation/quality/duration/timing/severity/associated sxs/prior treatment) HPI Comments: Patient with a past medical history significant for hypertension, COPD, ESRD presents to the ED or shortness of breath.  Patient states he has not been to dialysis in over a week. Patient usually has dialysis 3 times weekly- Tuesday, Thursday and Saturday.  Patient also endorses mild chest pain, severe headache with nausea and vomiting.  Denies any bloody emesis.  Patient admits to recent drug use of crack cocaine.  Pt does not urinate.  BM normal, no hematochezia.  On 4L O2 via [place] on arrival, tachypneic with retractions- respirations 28.  The history is provided by the patient.    Past Medical History  Diagnosis Date  . COPD (chronic obstructive pulmonary disease)   . Smoker   . Crack cocaine use   . Dental caries   . End stage renal disease 01/11/2012    Patient presented 11/04/11 to hospital with CP, wt loss and N/V. Creat was 10, admitted. 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 ANA was + 1:80, +MPO and +pr3 Ab's (ANCA). Received plasmapheresis x 7, IV cytoxan and pred taper. First HD was 11/17/11. 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 Pt d/c'd to do outpt HD and may have gotten 1-2 additional Cytoxan as OP, but this was stopped eventually since renal function didn't recover. Now gets HD TTS schedule at [facility].  L forearm AVF 11/19/11 by Dr. Scot Dock is current access.    Marland Kitchen HTN (hypertension) 06/12/2012    Past Surgical History  Procedure Laterality Date  . Av fistula placement  11/29/2011    Procedure: ARTERIOVENOUS (AV) FISTULA  CREATION;  Surgeon: Angelia Mould, MD;  Location: [facility] OR;  Service: Vascular;  Laterality: Left;    Family History  Problem Relation Age of Onset  . Heart attack Father     History  Substance Use Topics  . Smoking status: Current Every Day Smoker -- 1.00 packs/day for 25 years    Types: Cigarettes  . Smokeless tobacco: Never Used  . Alcohol Use: No      Review of Systems  Respiratory: Positive for shortness of breath.   All other systems reviewed and are negative.    Allergies  Review of patient's allergies indicates no known allergies.  Home Medications   Current Outpatient Rx  Name  Route  Sig  Dispense  Refill  . acetaminophen (TYLENOL) 500 MG tablet   Oral   Take 1,000 mg by mouth every 6 (six) hours as needed. pain         . amLODipine (NORVASC) 10 MG tablet   Oral   Take 10 mg by mouth daily.         . calcium carbonate (TUMS - DOSED IN MG ELEMENTAL CALCIUM) 500 MG chewable tablet   Oral   Chew 2 tablets by mouth as needed. For indigestin         . HYDROcodone-acetaminophen (NORCO/VICODIN) 5-325 MG per tablet   Oral   Take 1 tablet by mouth every 4 (four) hours as needed. For pain         .  hydrOXYzine (ATARAX/VISTARIL) 25 MG tablet   Oral   Take 25 mg by mouth 3 (three) times daily as needed. itching         . losartan (COZAAR) 100 MG tablet   Oral   Take 100 mg by mouth at bedtime.         . multivitamin (RENA-VIT) TABS tablet   Oral   Take 1 tablet by mouth at bedtime.         . pantoprazole (PROTONIX) 40 MG tablet   Oral   Take 40 mg by mouth daily at 12 noon.         . sevelamer (RENVELA) 800 MG tablet   Oral   Take 1,600 mg by mouth 3 (three) times daily with meals.           SpO2 98%  Physical Exam  Nursing note and vitals reviewed. Constitutional: He is oriented to person, place, and time. He appears well-developed and well-nourished.  HENT:  Head: Normocephalic and atraumatic.  Mouth/Throat: Oropharynx  is clear and moist. Mucous membranes are dry.  Eyes: Conjunctivae and EOM are normal. Pupils are equal, round, and reactive to light.  Neck: Normal range of motion.  Cardiovascular: Normal rate, regular rhythm and normal heart sounds.   Pulmonary/Chest: Accessory muscle usage present. Tachypnea noted. He has rhonchi.  Coarse breath sounds bilaterally, retractions and accessory muscle use noted  Abdominal: Soft. Bowel sounds are normal. There is no tenderness. There is no guarding and no CVA tenderness.  Genitourinary: Rectum normal. Guaiac negative stool.  Musculoskeletal: Normal range of motion.  Neurological: He is alert and oriented to person, place, and time.  Skin: Skin is warm and dry.  Psychiatric: He has a normal mood and affect.    ED Course  Procedures (including critical care time)   Date: 09/20/2012  Rate: 109  Rhythm: sinus tachycardia  QRS Axis: normal  Intervals: PR prolonged  ST/T Wave abnormalities: peaked t waves  Conduction Disutrbances:indeterminate  Narrative Interpretation: sinus tach, peaked t-waves  Old EKG Reviewed: changes noted    Labs Reviewed  CBC WITH DIFFERENTIAL - Abnormal; Notable for the following:    RBC 2.55 (*)    Hemoglobin 8.5 (*)    HCT 24.0 (*)    Platelets 127 (*)    Neutrophils Relative 88 (*)    Lymphocytes Relative 9 (*)    Monocytes Relative 2 (*)    All other components within normal limits  BASIC METABOLIC PANEL - Abnormal; Notable for the following:    Potassium >7.5 (*)    Chloride 87 (*)    BUN 178 (*)    Creatinine, Ser 30.10 (*)    GFR calc non Af Amer 1 (*)    GFR calc Af Amer 2 (*)    All other components within normal limits  CBC - Abnormal; Notable for the following:    RBC 2.62 (*)    Hemoglobin 8.7 (*)    HCT 25.2 (*)    Platelets 122 (*)    All other components within normal limits  CREATININE, SERUM - Abnormal; Notable for the following:    Creatinine, Ser 13.33 (*)    GFR calc non Af Amer 4 (*)     GFR calc Af Amer 4 (*)    All other components within normal limits  TROPONIN I - Abnormal; Notable for the following:    Troponin I 0.34 (*)    All other components within normal limits  BASIC METABOLIC PANEL -  Abnormal; Notable for the following:    Chloride 92 (*)    Glucose, Bld 112 (*)    BUN 60 (*)    Creatinine, Ser 13.17 (*)    Calcium 10.6 (*)    GFR calc non Af Amer 4 (*)    GFR calc Af Amer 5 (*)    All other components within normal limits  POCT I-STAT TROPONIN I - Abnormal; Notable for the following:    Troponin i, poc 0.09 (*)    All other components within normal limits  GLUCOSE, CAPILLARY  TROPONIN I  BASIC METABOLIC PANEL  CBC  OCCULT BLOOD, POC DEVICE   Dg Chest Portable 1 View  09/20/2012  *RADIOLOGY REPORT*  Clinical Data: Smoker with current history of hypertension, presenting with shortness of breath.  PORTABLE CHEST - 1 VIEW 09/20/2012 0911 hours:  Comparison: Two-view chest x-ray 06/13/2012.  Portable chest x-ray 04/17/2012.  Findings: Patchy airspace opacities throughout both lungs, most confluent at the left lung base.  Cardiac silhouette enlarged but stable.  Pulmonary vascularity normal.  No visible pleural effusions.  IMPRESSION: Patchy pneumonia throughout both lungs, most confluent at the left lung base.   Original Report Authenticated By: Evangeline Dakin, M.D.      1. Hyperkalemia   2. ESRD (end stage renal disease)   3. HTN (hypertension)   4. Shortness of breath   5. Drug abuse   6. Abnormal EKG   7. Anemia in chronic kidney disease   8. Cocaine abuse   9. Pulmonary edema       MDM   Pt presenting to the ED for SOB.  Pt has ESRD and has not had dialysis in over a week.  Usually dialyzed Tues, thursday, Saturday.  Also admits to recent drug use.  Pt on 4L O2 via [place] on arrival, tachypneic with noted retractions.  EKG with significantly peaked t-waves correlating with critical K+ of >7.5.  CBG 84- amp D50 given.  1g Ca+ gluconate infusing.   Pt hypoxic, increased O2 to 6L venti mask.  H/H decreased from previous- pt guiac negative. Nephrology consulsted, Dr. Jonnie Finner who evaluated pt in the ED- pt will be sent for immediate dialysis.  Internal medicine consulted, Dr. Dyann Kief- pt will be admitted to IP telemetry.  Temp holding orders placed.  Pt ok for transfer.  Larene Pickett, PA-C 09/20/12 2254

## 2012-09-20 NOTE — ED Notes (Signed)
Per [facility], pt has not be dialyzed for the past week because he gets very bad headaches when he goes to dialysis and they will not treat him. Pt has been more SOB and fatigued since last Saturday. BP is 200/110. Normally is a Tuesday, Thursday, Saturday dialysis patient.

## 2012-09-20 NOTE — Progress Notes (Signed)
Dr. Frederik Pear notified of patient c/o vomiting blood and BP 179/117. New order to recheck BP now that patient is relaxed and notifiy if BP is 180 or greater. Will continue to monitor.

## 2012-09-20 NOTE — ED Notes (Signed)
Override medication in pyxis due to patient is in critical condition

## 2012-09-20 NOTE — ED Notes (Signed)
cbg of 84 completed. Phlebotomy at the bedside.

## 2012-09-20 NOTE — ED Notes (Signed)
Results of troponin and chem 8 given to Dr. Lita Mains

## 2012-09-20 NOTE — Procedures (Signed)
I was present at this dialysis session. I have reviewed the session itself and made appropriate changes.   Kelly Splinter, MD [employer] 09/20/2012, 2:58 PM

## 2012-09-20 NOTE — Consult Note (Signed)
Jeremy Mccoy 09/20/2012 [place] [facility] Requesting Physician:  Dr Dyann Kief  Reason for Consult:  ESRD pt missed one week's worth of dialysis, in ED with SOB, weakness, widened QRS on EKG, labs pending HPI: The patient is a 47 y.o. 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 Has been HD dependent since, has a L forearm AVF.  Has hx of marginal compliance, will show up late, sign off early, skip treatments occasionally.  Has not had HD for one week, this is longest pt has gone without showing up to HD per staff at outpt center.  Pt says severe HA's at dialysis are reason he hasn't been going.    Presents to ED today with SOB, gen weakness, HA's and nausea. No CP, no diarrhea or abd pain, no fevers.    Past Medical History:  Past Medical History  Diagnosis Date  . COPD (chronic obstructive pulmonary disease)   . Smoker   . Crack cocaine use   . Dental caries   . End stage renal disease 01/11/2012    Patient presented 11/04/11 to hospital with CP, wt loss and N/V. Creat was 10, admitted. 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 ANA was + 1:80, +MPO and +pr3 Ab's (ANCA). Received plasmapheresis x 7, IV cytoxan and pred taper. First HD was 11/17/11. 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 Pt d/c'd to do outpt HD and may have gotten 1-2 additional Cytoxan as OP, but this was stopped eventually since renal function didn't recover. Now gets HD TTS schedule at [facility].  L forearm AVF 11/19/11 by Dr. Scot Dock is current access.    Marland Kitchen HTN (hypertension) 06/12/2012    Past Surgical History:  Past Surgical History  Procedure Laterality Date  . Av fistula placement  11/29/2011    Procedure: ARTERIOVENOUS (AV) FISTULA CREATION;  Surgeon: Angelia Mould, MD;  Location: [facility] OR;  Service: Vascular;  Laterality:  Left;    Family History:  Family History  Problem Relation Age of Onset  . Heart attack Father    Social History:  reports that he has been smoking Cigarettes.  He has a 25 pack-year smoking history. He has never used smokeless tobacco. He reports that he uses illicit drugs (Cocaine and Marijuana). He reports that he does not drink alcohol.  Allergies: No Known Allergies  Home medications: Prior to Admission medications   Medication Sig Start Date End Date Taking? Authorizing Provider  acetaminophen (TYLENOL) 500 MG tablet Take 1,000 mg by mouth every 6 (six) hours as needed. pain   Yes Historical Provider, MD  amLODipine (NORVASC) 10 MG tablet Take 10 mg by mouth daily.   Yes Historical Provider, MD  calcium carbonate (TUMS - DOSED IN MG ELEMENTAL CALCIUM) 500 MG chewable tablet Chew 2 tablets by mouth as needed. For indigestin 11/29/11 11/28/12 Yes Myriam Jacobson, PA-C  HYDROcodone-acetaminophen (NORCO/VICODIN) 5-325 MG per tablet Take 1 tablet by mouth every 4 (four) hours as needed. For pain 02/28/12  Yes Shari A Upstill, PA-C  hydrOXYzine (ATARAX/VISTARIL) 25 MG tablet Take 25 mg by mouth 3 (three) times daily as needed. itching   Yes Historical Provider, MD  losartan (COZAAR) 100 MG tablet Take 100 mg by mouth at bedtime.   Yes Historical Provider, MD  multivitamin (RENA-VIT) TABS tablet Take 1 tablet by mouth  at bedtime. 11/29/11  Yes Myriam Jacobson, PA-C  pantoprazole (PROTONIX) 40 MG tablet Take 40 mg by mouth daily at 12 noon. 11/29/11 11/28/12 Yes Myriam Jacobson, PA-C  sevelamer (RENVELA) 800 MG tablet Take 1,600 mg by mouth 3 (three) times daily with meals. 11/29/11 11/28/12 Yes Myriam Jacobson, PA-C    Labs: Basic Metabolic Panel: No results found for this basename: NA, K, CL, CO2, GLUCOSE, BUN, CREATININE, ALB, CALCIUM, PHOS,  in the last 168 hours Liver Function Tests: No results found for this basename: AST, ALT, ALKPHOS, BILITOT, PROT, ALBUMIN,  in the last 168  hours No results found for this basename: LIPASE, AMYLASE,  in the last 168 hours No results found for this basename: AMMONIA,  in the last 168 hours CBC:  Recent Labs Lab 09/20/12 0850  WBC 8.4  NEUTROABS 7.5  HGB 8.5*  HCT 24.0*  MCV 94.1  PLT 127*   PT/INR: @LABRCNTIP (inr:5) Cardiac Enzymes: )No results found for this basename: CKTOTAL, CKMB, CKMBINDEX, TROPONINI,  in the last 168 hours CBG:  Recent Labs Lab 09/20/12 0851  GLUCAP 84    Physical Exam:  Blood pressure 189/119, pulse 114, temperature 98.1 F (36.7 C), temperature source Oral, resp. rate 28, height 6' (1.829 m), SpO2 98.00%.  Gen: adult male, on FM O2, a bit tachypneic HEENT:  EOMI, sclera anicteric, throat clear Neck: + JVD, no LAN Chest: bibasilar crackles CV: regular, no rub or gallop, no carotid or femoral bruits Abdomen: soft, nontender, no mass or ascites Ext: no LE edema, no joint effusion or deformity, no gangrene or ulceration Neuro: alert, Ox3, no focal deficit Access: L forearm AVF good bruit  Outpatient HD: [facility]  Last HD was April 30   F180   450/800 1K/2.25Ca bath  4hrs 15 min   EDW 73kg  L FA AVF   EPO 4200  Heparin 7400   Venofer 50 mg/wk     Impression/Plan 1. SOB / volume excess / diffuse pulm edema-  from missed HD, needs acute HD, see orders 2. Abnl EKG- suggestive of hyperkalemia, labs pending, getting acute Rx in ED then HD asap 3. ESRD, as above, usual HD is TTS 4. HTN/volume- HD today, cont norvasc / losartan as at home 5. Anemia of CKD- give darbe 150 ug today, Hb down prob due to missing EPO at HD 6. Secondary HPTH- get binders, not on vit D 7. Long hx cocaine abuse   Kelly Splinter  MD [facility] [phone] pgr    [phone] cell 09/20/2012, 9:51 AM

## 2012-09-21 ENCOUNTER — Inpatient Hospital Stay (HOSPITAL_COMMUNITY): Payer: Medicare Other

## 2012-09-21 DIAGNOSIS — N189 Chronic kidney disease, unspecified: Secondary | ICD-10-CM

## 2012-09-21 DIAGNOSIS — E875 Hyperkalemia: Secondary | ICD-10-CM

## 2012-09-21 DIAGNOSIS — N186 End stage renal disease: Secondary | ICD-10-CM

## 2012-09-21 DIAGNOSIS — R7989 Other specified abnormal findings of blood chemistry: Secondary | ICD-10-CM

## 2012-09-21 DIAGNOSIS — D631 Anemia in chronic kidney disease: Secondary | ICD-10-CM

## 2012-09-21 DIAGNOSIS — R9431 Abnormal electrocardiogram [ECG] [EKG]: Secondary | ICD-10-CM

## 2012-09-21 DIAGNOSIS — F1410 Cocaine abuse, uncomplicated: Secondary | ICD-10-CM

## 2012-09-21 LAB — CBC
HCT: 25 % — ABNORMAL LOW (ref 39.0–52.0)
MCV: 97.7 fL (ref 78.0–100.0)
RDW: 15.2 % (ref 11.5–15.5)
WBC: 6.1 10*3/uL (ref 4.0–10.5)

## 2012-09-21 LAB — RENAL FUNCTION PANEL
Albumin: 4.2 g/dL (ref 3.5–5.2)
Calcium: 10.3 mg/dL (ref 8.4–10.5)
Chloride: 93 mEq/L — ABNORMAL LOW (ref 96–112)
Creatinine, Ser: 10.86 mg/dL — ABNORMAL HIGH (ref 0.50–1.35)
GFR calc Af Amer: 6 mL/min — ABNORMAL LOW (ref >90)
GFR calc non Af Amer: 5 mL/min — ABNORMAL LOW (ref >90)
Sodium: 140 mEq/L (ref 135–145)

## 2012-09-21 LAB — BASIC METABOLIC PANEL
CO2: 30 mEq/L (ref 19–32)
Chloride: 92 mEq/L — ABNORMAL LOW (ref 96–112)
Chloride: 89 mEq/L — ABNORMAL LOW (ref 96–112)
Creatinine, Ser: 16.44 mg/dL — ABNORMAL HIGH (ref 0.50–1.35)
GFR calc Af Amer: 3 mL/min — ABNORMAL LOW (ref >90)
Glucose, Bld: 97 mg/dL (ref 70–99)
Potassium: 5.4 mEq/L — ABNORMAL HIGH (ref 3.5–5.1)

## 2012-09-21 LAB — TROPONIN I: Troponin I: <0.3 ng/mL (ref <0.30)

## 2012-09-21 MED ORDER — HEPARIN SODIUM (PORCINE) 1000 UNIT/ML DIALYSIS: 7400.0000 [IU] | Freq: Once | INTRAMUSCULAR | Status: DC

## 2012-09-21 MED ORDER — NEPRO/CARBSTEADY PO LIQD: 237.0000 mL | ORAL | Status: DC | PRN

## 2012-09-21 MED ORDER — SODIUM CHLORIDE 0.9 % IV SOLN: 100.0000 mL | INTRAVENOUS | Status: DC | PRN

## 2012-09-21 MED ORDER — LIDOCAINE HCL (PF) 1 % IJ SOLN: 5.0000 mL | INTRAMUSCULAR | Status: DC | PRN

## 2012-09-21 MED ORDER — HEPARIN SODIUM (PORCINE) 1000 UNIT/ML DIALYSIS: 1000.0000 [IU] | INTRAMUSCULAR | Status: DC | PRN

## 2012-09-21 MED ORDER — ALTEPLASE 2 MG IJ SOLR: 2.0000 mg | Freq: Once | INTRAMUSCULAR | Status: DC | PRN

## 2012-09-21 MED ORDER — LIDOCAINE-PRILOCAINE 2.5-2.5 % EX CREA: 1.0000 "application " | TOPICAL_CREAM | CUTANEOUS | Status: DC | PRN

## 2012-09-21 MED ORDER — PENTAFLUOROPROP-TETRAFLUOROETH EX AERO: 1.0000 "application " | INHALATION_SPRAY | CUTANEOUS | Status: DC | PRN

## 2012-09-21 MED ORDER — ENOXAPARIN SODIUM 30 MG/0.3ML ~~LOC~~ SOLN
30.0000 mg | Freq: Every day | SUBCUTANEOUS | Status: DC
  Administered 2012-09-21: 30 mg via SUBCUTANEOUS
  Filled 2012-09-21 (×2): qty 0.3

## 2012-09-21 MED ORDER — ALTEPLASE 2 MG IJ SOLR
2.0000 mg | Freq: Once | INTRAMUSCULAR | Status: DC | PRN
  Filled 2012-09-21: qty 2

## 2012-09-21 NOTE — ED Provider Notes (Signed)
Medical screening examination/treatment/procedure(s) were conducted as a shared visit with non-physician practitioner(s) and myself.  I personally evaluated the patient during the encounter  CRITICAL CARE Performed by: Lita Mains, Wilfred Siverson Total critical care time: 45 Critical care time was exclusive of separately billable procedures and treating other patients. Critical care was necessary to treat or prevent imminent or life-threatening deterioration. Critical care was time spent personally by me on the following activities: development of treatment plan with patient and/or surrogate as well as nursing, discussions with consultants, evaluation of patient's response to treatment, examination of patient, obtaining history from patient or surrogate, ordering and performing treatments and interventions, ordering and review of laboratory studies, ordering and review of radiographic studies, pulse oximetry and re-evaluation of patient's condition.   Discussed with Dr Melvia Heaps. Advised insulin, glucose, and albuterol. Will take to HD. [facility] to admit  Julianne Rice, MD 09/21/12 [phone]

## 2012-09-21 NOTE — Progress Notes (Signed)
Subjective:  Feeling much better, no current complaints  Objective: Vital signs in last 24 hours: Temp:  [97.6 F (36.4 C)-99.1 F (37.3 C)] 99.1 F (37.3 C) (05/09 0507) Pulse Rate:  [100-123] 108 (05/09 0507) Resp:  [15-28] 18 (05/09 0507) BP: (148-196)/(95-125) 158/100 mmHg (05/09 0535) SpO2:  [78 %-99 %] 93 % (05/09 0507) FiO2 (%):  [50 %] 50 % (05/08 1217) Weight:  [68.4 kg (150 lb 12.7 oz)-78.3 kg (172 lb 9.9 oz)] 72.9 kg (160 lb 11.5 oz) (05/08 2112) Weight change:   Intake/Output from previous day: 05/08 0701 - 05/09 0700 In: -  Out: 5001    EXAM: General appearance: Alert, in no apparent distress Resp:  CTA without rales, rhonchi, or wheezes Cardio:  RRR without murmur or rub GI:  + BS, soft and nontender Extremities:  No edema Access:  AVF @ LFA with + bruit  Lab Results:  Recent Labs  09/20/12 0850 09/20/12 1751  WBC 8.4 7.3  HGB 8.5* 8.7*  HCT 24.0* 25.2*  PLT 127* 122*   BMET:  Recent Labs  09/20/12 1011 09/20/12 1751 09/20/12 1755  NA 136  --  141  K >7.5*  --  4.5  CL 87*  --  92*  CO2 19  --  27  GLUCOSE 88  --  112*  BUN 178*  --  60*  CREATININE 30.10* 13.33* 13.17*  CALCIUM 9.7  --  10.6*   No results found for this basename: PTH,  in the last 72 hours Iron Studies: No results found for this basename: IRON, TIBC, TRANSFERRIN, FERRITIN,  in the last 72 hours  Outpatient HD: [facility] Last HD was April 30 F180 450/800 1K/2.25Ca bath 4hrs 15 min EDW 73kg L FA AVF EPO 4200 Heparin 7400 Venofer 50 mg/wk   Assessment/Plan: 1. Dyspnea - Secondary to diffuse pulmonary edema after missing HD for 1 wk; better s/p HD yesterday. 2. Hyperkalemia / QRS widening / K > 7.5-  K4.5 post-HD yesterday, but today up to 6.6.  Post HD K+ yest was prob erroneously low due to delayed equilibration. Plan HD today again 3. ESRD - HD on TTS @ [facility]; s/p HD yesterday, previous HD on 4/30.  HD today for elevated K and BP. 4. HTN/Volume - BP 158/100 most  recently, on Amlodipine 10 mg qd & Losartan 100 mg qd; current wt 72.9 with EDW 73. 5. Anemia - Hgb 8.7 s/p Aranesp 150 mcg yesterday, also Venofer qwk. 6. Secondary hyperparathyroidism - Ca 10.6, no Hectorol, Renvela 2 with meals.  7. Hx cocaine abuse    LOS: 1 day   [name],[name] 09/21/2012,7:04 AM  Patient seen and examined.  I agree with plan as above with additions as indicated. Kelly Splinter  MD [phone] pgr    [phone] cell 09/21/2012, 12:09 PM

## 2012-09-21 NOTE — Progress Notes (Signed)
Pt has home medications at bedside table per pt, where given to him to keep. Pt explained per hospital policy that he cannot keep them at bedside. Pt became irritable and refused to have RN take medication. States he will have someone "come get them now if that's a problem because they are really expensive." Notified Malachy Mood, charge nurse about situation. Will continue to monitor.

## 2012-09-21 NOTE — Procedures (Signed)
I was present at this dialysis session. I have reviewed the session itself and made appropriate changes.   Kelly Splinter, MD [employer] 09/21/2012, 3:27 PM

## 2012-09-21 NOTE — Progress Notes (Signed)
[facility] PROGRESS NOTE  POLLUX EYE [id number] DOB: [birth year] DOA: 09/20/2012 PCP: Pcp Not In System  Assessment/Plan: 1. Volume overload/Pulmonary edema /ESRD -from missed HD -non compliance, -per Renal  2. Hyperkalemia: as above -HD again today, if repeat K ok then home today after HD  3. Cocaine/Polysubstance abuse -counseled, CSW consult  4. -Abnormal EKG: most likely triggered by electrolytes abnormalities given hyperkalemia.  -received albuterol, insulin, calcium gluconate and emergent HD.  -cardiac enzymes negative, DC echo  5. Anemia in chronic kidney disease: aranesp per renal -  No signs of acute bleeding.   Dvt proph: lovenox   Code Status: Full Family Communication: d/w pt and family at bedside Disposition Plan: home tonight if K ok after HD   Consultants:  Renal  HPI/Subjective: Feels well, no complaints  Objective: Filed Vitals:   09/21/12 0011 09/21/12 0507 09/21/12 0535 09/21/12 0940  BP: 161/98 171/114 158/100 175/110  Pulse: 108 108  124  Temp:  99.1 F (37.3 C)  98.9 F (37.2 C)  TempSrc:  Oral  Oral  Resp:  18  17  Height:      Weight:      SpO2:  93%  96%    Intake/Output Summary (Last 24 hours) at 09/21/12 1245 Last data filed at 09/20/12 1624  Gross per 24 hour  Intake      0 ml  Output   5001 ml  Net  -5001 ml   Filed Weights   09/20/12 1217 09/20/12 1624 09/20/12 2112  Weight: 78.3 kg (172 lb 9.9 oz) 72.9 kg (160 lb 11.5 oz) 72.9 kg (160 lb 11.5 oz)    Exam:   General:  AAOx3, no distress  Cardiovascular:S1S2/RRR  Respiratory: CTAB  Abdomen: soft, NT, BS present  Musculoskeletal: soft, NT, BS present  Neuro: non focal  Data Reviewed: Basic Metabolic Panel:  Recent Labs Lab 09/20/12 1011 09/20/12 1751 09/20/12 1755 09/21/12 0700  NA 136  --  141 139  K >7.5*  --  4.5 6.6*  CL 87*  --  92* 92*  CO2 19  --  27 30  GLUCOSE 88  --  112* 97  BUN 178*  --  60* 75*  CREATININE 30.10*  13.33* 13.17* 16.44*  CALCIUM 9.7  --  10.6* 10.4   Liver Function Tests: No results found for this basename: AST, ALT, ALKPHOS, BILITOT, PROT, ALBUMIN,  in the last 168 hours No results found for this basename: LIPASE, AMYLASE,  in the last 168 hours No results found for this basename: AMMONIA,  in the last 168 hours CBC:  Recent Labs Lab 09/20/12 0850 09/20/12 1751 09/21/12 0700  WBC 8.4 7.3 6.1  NEUTROABS 7.5  --   --   HGB 8.5* 8.7* 8.5*  HCT 24.0* 25.2* 25.0*  MCV 94.1 96.2 97.7  PLT 127* 122* 142*   Cardiac Enzymes:  Recent Labs Lab 09/20/12 1755 09/20/12 2258  TROPONINI 0.34* <0.30   BNP (last 3 results)  Recent Labs  11/04/11 1658 11/05/11 0430 04/15/12 0109  PROBNP 592.1* 499.7* >70000.0*   CBG:  Recent Labs Lab 09/20/12 0851  GLUCAP 84    No results found for this or any previous visit (from the past 240 hour(s)).   Studies: Dg Chest Port 1 View  09/21/2012  *RADIOLOGY REPORT*  Clinical Data: Follow up pulmonary edema. Shortness of breath.  PORTABLE CHEST - 1 VIEW  Comparison: 09/20/2012.  Findings: Mild improvement but incomplete clearance of pulmonary edema.  Heart size within  normal limits.  No gross pneumothorax.  IMPRESSION: Mild improvement but incomplete clearance of pulmonary edema.   Original Report Authenticated By: Genia Del, M.D.    Dg Chest Portable 1 View  09/20/2012  *RADIOLOGY REPORT*  Clinical Data: Smoker with current history of hypertension, presenting with shortness of breath.  PORTABLE CHEST - 1 VIEW 09/20/2012 0911 hours:  Comparison: Two-view chest x-ray 06/13/2012.  Portable chest x-ray 04/17/2012.  Findings: Patchy airspace opacities throughout both lungs, most confluent at the left lung base.  Cardiac silhouette enlarged but stable.  Pulmonary vascularity normal.  No visible pleural effusions.  IMPRESSION: Patchy pneumonia throughout both lungs, most confluent at the left lung base.   Original Report Authenticated By: Evangeline Dakin, M.D.     Scheduled Meds: . amLODipine  10 mg Oral Daily  . darbepoetin (ARANESP) injection - DIALYSIS  150 mcg Intravenous Q Thu-HD  . heparin  7,400 Units Dialysis Once in dialysis  . losartan  100 mg Oral QHS  . multivitamin  1 tablet Oral QHS  . nicotine  14 mg Transdermal Daily  . pantoprazole  40 mg Oral Q1200  . sevelamer carbonate  1,600 mg Oral TID WC  . sodium chloride  3 mL Intravenous Q12H  . sodium chloride  3 mL Intravenous Q12H   Continuous Infusions:   Principal Problem:   Shortness of breath Active Problems:   Tobacco abuse   Pulmonary edema   Volume excess   Abnormal EKG   ESRD (end stage renal disease)   Drug abuse   Elevated troponin   Anemia in chronic kidney disease    Time spent: 29min    Lyndy Russman  [facility] Pager [phone]. If 7PM-7AM, please contact night-coverage at [email or web address], password [facility] 09/21/2012, 12:45 PM  LOS: 1 day

## 2012-09-21 NOTE — Progress Notes (Signed)
CRITICAL VALUE ALERT  Critical value received:  K+ 6.6  Date of notification:  09/21/2012  Time of notification:  0821  Critical value read back: yes  Nurse who received alert:  Adelene Idler, RN BC, BSN, MSN  MD notified (1st page):  Ramiro Harvest, PA-C  Time of first page:  [phone]  MD notified (2nd page):  Time of second page:  Responding MD:  Ramiro Harvest PA-C  Time MD responded:  [phone]

## 2012-09-22 NOTE — Discharge Summary (Signed)
Physician Discharge Summary  Jeremy Mccoy [id number] DOB: 01/17/1966 DOA: 09/20/2012  PCP: Pcp Not In System  Admit date: 09/20/2012 Discharge date: 09/22/2012  Time spent: 45 minutes  Recommendations for Outpatient Follow-up:  1. PCP in 1 week 2. Outpatient resources for substance abuse  Discharge Diagnoses:    Volume overload   Non compliance with medications hemodialysis   Tobacco abuse   Pulmonary edema   Hyperkalemia   Abnormal EKG   ESRD (end stage renal disease)   Cocaine abuse   Elevated troponin   Anemia in chronic kidney disease   Discharge Condition: stable  Diet recommendation: Renal  Filed Weights   09/21/12 1336 09/21/12 1704 09/21/12 2100  Weight: 73.1 kg (161 lb 2.5 oz) 68.3 kg (150 lb 9.2 oz) 70.3 kg (154 lb 15.7 oz)    History of present illness:  Jeremy Mccoy is a 47 y.o. male with PMH significant for cocaine abuse, tobacco abuse, HTN and ESRD; came to the hospital complaining of increase SOB and chest discomfort. He missed HD treatment for a whole week, reportedly secondary to HA's at Dialysis center. Denies Chills, Fever, melena, hematochezia or any other complaints. He reports continue use of cocaine actively. In ED potassium >7.5, Cr of 30 and BUN of 178; mild troponin elevation.   Hospital Course:  1. Volume overload/Pulmonary edema /ESRD -from missed HD  -long standing h/o non compliance,  -Dialyzed 2 consecutive days per renal and discharged home in a stable condition    2. Hyperkalemia:due to missed dialysis -corrected with 2 extra HD treatments -potassium at Dc was 4.5  3. Cocaine/Polysubstance abuse  -counseled, CSW consulted  4. -Abnormal EKG:  triggered by electrolytes abnormalities given hyperkalemia.  -received albuterol, insulin, calcium gluconate and emergent HD.  -cardiac enzymes negative  5. Anemia in chronic kidney disease:  - aranesp per renal      Consultations:  Renal  Discharge Exam: Filed Vitals:   09/21/12 1704 09/21/12 1751 09/21/12 2100 09/22/12 0556  BP: 173/103 155/113 156/103 152/93  Pulse: 116 117 106 95  Temp: 98.5 F (36.9 C) 98.9 F (37.2 C) 98.8 F (37.1 C) 99.2 F (37.3 C)  TempSrc: Oral Oral Oral Oral  Resp: 13 15 16 16   Height:      Weight: 68.3 kg (150 lb 9.2 oz)  70.3 kg (154 lb 15.7 oz)   SpO2: 93% 95% 95% 96%    General: AAOx3 Cardiovascular: S1S2/RRR Respiratory: ctab  Discharge Instructions  Discharge Orders   Future Orders Complete By Expires     Discharge instructions  As directed     Comments:      Renal Diet    Increase activity slowly  As directed         Medication List    TAKE these medications       acetaminophen 500 MG tablet  Commonly known as:  TYLENOL  Take 1,000 mg by mouth every 6 (six) hours as needed. pain     amLODipine 10 MG tablet  Commonly known as:  NORVASC  Take 10 mg by mouth daily.     calcium carbonate 500 MG chewable tablet  Commonly known as:  TUMS - dosed in mg elemental calcium  Chew 2 tablets by mouth as needed. For indigestin     HYDROcodone-acetaminophen 5-325 MG per tablet  Commonly known as:  NORCO/VICODIN  Take 1 tablet by mouth every 4 (four) hours as needed. For pain     hydrOXYzine 25 MG tablet  Commonly  known as:  ATARAX/VISTARIL  Take 25 mg by mouth 3 (three) times daily as needed. itching     losartan 100 MG tablet  Commonly known as:  COZAAR  Take 100 mg by mouth at bedtime.     multivitamin Tabs tablet  Take 1 tablet by mouth at bedtime.     pantoprazole 40 MG tablet  Commonly known as:  PROTONIX  Take 40 mg by mouth daily at 12 noon.     sevelamer carbonate 800 MG tablet  Commonly known as:  RENVELA  Take 1,600 mg by mouth 3 (three) times daily with meals.       No Known Allergies     Follow-up Information   Follow up with PCP. Schedule an appointment as soon as possible for a visit in 1 week.       The results of significant diagnostics from this hospitalization  (including imaging, microbiology, ancillary and laboratory) are listed below for reference.    Significant Diagnostic Studies: Dg Chest Port 1 View  09/21/2012  *RADIOLOGY REPORT*  Clinical Data: Follow up pulmonary edema. Shortness of breath.  PORTABLE CHEST - 1 VIEW  Comparison: 09/20/2012.  Findings: Mild improvement but incomplete clearance of pulmonary edema.  Heart size within normal limits.  No gross pneumothorax.  IMPRESSION: Mild improvement but incomplete clearance of pulmonary edema.   Original Report Authenticated By: Genia Del, M.D.    Dg Chest Portable 1 View  09/20/2012  *RADIOLOGY REPORT*  Clinical Data: Smoker with current history of hypertension, presenting with shortness of breath.  PORTABLE CHEST - 1 VIEW 09/20/2012 0911 hours:  Comparison: Two-view chest x-ray 06/13/2012.  Portable chest x-ray 04/17/2012.  Findings: Patchy airspace opacities throughout both lungs, most confluent at the left lung base.  Cardiac silhouette enlarged but stable.  Pulmonary vascularity normal.  No visible pleural effusions.  IMPRESSION: Patchy pneumonia throughout both lungs, most confluent at the left lung base.   Original Report Authenticated By: Evangeline Dakin, M.D.     Microbiology: No results found for this or any previous visit (from the past 240 hour(s)).   Labs: Basic Metabolic Panel:  Recent Labs Lab 09/20/12 1011 09/20/12 1751 09/20/12 1755 09/21/12 0700 09/21/12 1330 09/21/12 1900  NA 136  --  141 139 137 140  K >7.5*  --  4.5 6.6* 5.4* 4.5  CL 87*  --  92* 92* 89* 93*  CO2 19  --  27 30 31 31   GLUCOSE 88  --  112* 97 157* 141*  BUN 178*  --  60* 75* 86* 43*  CREATININE 30.10* 13.33* 13.17* 16.44* 17.70* 10.86*  CALCIUM 9.7  --  10.6* 10.4 9.7 10.3  PHOS  --   --   --   --   --  5.7*   Liver Function Tests:  Recent Labs Lab 09/21/12 1900  ALBUMIN 4.2   No results found for this basename: LIPASE, AMYLASE,  in the last 168 hours No results found for this basename:  AMMONIA,  in the last 168 hours CBC:  Recent Labs Lab 09/20/12 0850 09/20/12 1751 09/21/12 0700  WBC 8.4 7.3 6.1  NEUTROABS 7.5  --   --   HGB 8.5* 8.7* 8.5*  HCT 24.0* 25.2* 25.0*  MCV 94.1 96.2 97.7  PLT 127* 122* 142*   Cardiac Enzymes:  Recent Labs Lab 09/20/12 1755 09/20/12 2258  TROPONINI 0.34* <0.30   BNP: BNP (last 3 results)  Recent Labs  11/04/11 1658 11/05/11 0430 04/15/12 0109  PROBNP  592.1* 499.7* >70000.0*   CBG:  Recent Labs Lab 09/20/12 0851  GLUCAP 84       Signed:  Jakyah Bradby  [facility] 09/22/2012, 6:24 PM

## 2012-09-22 NOTE — Progress Notes (Signed)
Subjective:  No complaints, hoping for discharge and dialysis at outpatient center in afternoon.  Objective: Vital signs in last 24 hours: Temp:  [98.5 F (36.9 C)-99.2 F (37.3 C)] 99.2 F (37.3 C) (05/10 0556) Pulse Rate:  [95-124] 95 (05/10 0556) Resp:  [13-19] 16 (05/10 0556) BP: (148-181)/(11-113) 152/93 mmHg (05/10 0556) SpO2:  [93 %-96 %] 96 % (05/10 0556) Weight:  [68.3 kg (150 lb 9.2 oz)-73.1 kg (161 lb 2.5 oz)] 70.3 kg (154 lb 15.7 oz) (05/09 2100) Weight change: 4.7 kg (10 lb 5.8 oz)  Intake/Output from previous day: 05/09 0701 - 05/10 0700 In: -  Out: 3000    EXAM: General appearance:  Alert, in no apparent distress Resp:  CTA without rales, rhonchi, or wheezes Cardio:  RRR without murmur or rub GI:  + BS, soft and nontender Extremities:  No edema Access:  AVF @ LFA with + bruit  Lab Results:  Recent Labs  09/20/12 1751 09/21/12 0700  WBC 7.3 6.1  HGB 8.7* 8.5*  HCT 25.2* 25.0*  PLT 122* 142*   BMET:  Recent Labs  09/21/12 1330 09/21/12 1900  NA 137 140  K 5.4* 4.5  CL 89* 93*  CO2 31 31  GLUCOSE 157* 141*  BUN 86* 43*  CREATININE 17.70* 10.86*  CALCIUM 9.7 10.3  ALBUMIN  --  4.2   No results found for this basename: PTH,  in the last 72 hours Iron Studies: No results found for this basename: IRON, TIBC, TRANSFERRIN, FERRITIN,  in the last 72 hours  Outpatient HD: [facility] Last HD was April 30 F180 450/800 1K/2.25Ca bath 4hrs 15 min EDW 73kg L FA AVF EPO 4200 Heparin 7400 Venofer 50 mg/wk   Assessment/Plan: 1. Dyspnea - Secondary to diffuse pulmonary edema after missing HD for 1 wk; better s/p HD 5/8 & 5/9.Marland Kitchen 2. Hyperkalemia - with QRS widening; K >7.5 initially, 6.6 yesterday, 4.5 post-HD yesterday. 3. ESRD - HD on TTS @ [facility]; s/p HD 5/8 & 5/9, previous HD on 4/30.  Next HD today, hopefully at center, 4. HTN/Volume - BP 152/93 most recently, on Amlodipine 10 mg qd & Losartan 100 mg qd; current wt 70.3 with EDW 73. 5. Anemia - Hgb 8.5  s/p Aranesp 150 mcg 5/8, also Venofer qwk. 6. Secondary hyperparathyroidism - Ca 10.3, P 5.6; no Hectorol, Renvela 2 with meals.  7. Hx cocaine abuse      LOS: 2 days   [name],[name] 09/22/2012,6:43 AM  Patient seen and examined.  Agree with assessment and plan as above. OK for discharge today.  Kelly Splinter  MD [phone] pgr    [phone] cell 09/22/2012, 9:37 AM

## 2012-09-22 NOTE — Progress Notes (Signed)
Patient discharge Home per Md order.  Discharge instructions reviewed with patient and family.  Copies of all forms given and explained. Patient/family voiced understanding of all instructions.  Discharge in no acute distress. Patient to follow up at out patient dialysis center at 1130am today.

## 2013-04-22 DIAGNOSIS — R0989 Other specified symptoms and signs involving the circulatory and respiratory systems: Secondary | ICD-10-CM | POA: Insufficient documentation

## 2013-04-27 DIAGNOSIS — L290 Pruritus ani: Secondary | ICD-10-CM | POA: Insufficient documentation

## 2013-05-15 ENCOUNTER — Inpatient Hospital Stay (HOSPITAL_COMMUNITY)
Admission: EM | Admit: 2013-05-15 | Discharge: 2013-05-17 | DRG: 377 | Disposition: A | Discharge status: Home / Self Care | Payer: Medicare Other | Attending: Internal Medicine | Admitting: Internal Medicine

## 2013-05-15 ENCOUNTER — Encounter (HOSPITAL_COMMUNITY)
Admission: EM | Disposition: A | Discharge status: Home / Self Care | Payer: Self-pay | Source: Home / Self Care | Attending: Internal Medicine

## 2013-05-15 ENCOUNTER — Encounter (HOSPITAL_COMMUNITY): Payer: Self-pay | Admitting: Emergency Medicine

## 2013-05-15 DIAGNOSIS — N2581 Secondary hyperparathyroidism of renal origin: Secondary | ICD-10-CM | POA: Diagnosis present

## 2013-05-15 DIAGNOSIS — K449 Diaphragmatic hernia without obstruction or gangrene: Secondary | ICD-10-CM | POA: Diagnosis present

## 2013-05-15 DIAGNOSIS — G894 Chronic pain syndrome: Secondary | ICD-10-CM | POA: Diagnosis present

## 2013-05-15 DIAGNOSIS — K209 Esophagitis, unspecified without bleeding: Secondary | ICD-10-CM | POA: Diagnosis present

## 2013-05-15 DIAGNOSIS — J811 Chronic pulmonary edema: Secondary | ICD-10-CM

## 2013-05-15 DIAGNOSIS — Z992 Dependence on renal dialysis: Secondary | ICD-10-CM

## 2013-05-15 DIAGNOSIS — D62 Acute posthemorrhagic anemia: Secondary | ICD-10-CM

## 2013-05-15 DIAGNOSIS — Z720 Tobacco use: Secondary | ICD-10-CM

## 2013-05-15 DIAGNOSIS — D649 Anemia, unspecified: Secondary | ICD-10-CM | POA: Insufficient documentation

## 2013-05-15 DIAGNOSIS — D631 Anemia in chronic kidney disease: Secondary | ICD-10-CM

## 2013-05-15 DIAGNOSIS — N186 End stage renal disease: Secondary | ICD-10-CM

## 2013-05-15 DIAGNOSIS — R778 Other specified abnormalities of plasma proteins: Secondary | ICD-10-CM

## 2013-05-15 DIAGNOSIS — F1410 Cocaine abuse, uncomplicated: Secondary | ICD-10-CM

## 2013-05-15 DIAGNOSIS — H5350 Unspecified color vision deficiencies: Secondary | ICD-10-CM

## 2013-05-15 DIAGNOSIS — F17200 Nicotine dependence, unspecified, uncomplicated: Secondary | ICD-10-CM | POA: Diagnosis present

## 2013-05-15 DIAGNOSIS — R519 Headache, unspecified: Secondary | ICD-10-CM

## 2013-05-15 DIAGNOSIS — E8770 Fluid overload, unspecified: Secondary | ICD-10-CM

## 2013-05-15 DIAGNOSIS — Z8249 Family history of ischemic heart disease and other diseases of the circulatory system: Secondary | ICD-10-CM

## 2013-05-15 DIAGNOSIS — K2980 Duodenitis without bleeding: Secondary | ICD-10-CM | POA: Diagnosis present

## 2013-05-15 DIAGNOSIS — K920 Hematemesis: Principal | ICD-10-CM | POA: Diagnosis present

## 2013-05-15 DIAGNOSIS — K029 Dental caries, unspecified: Secondary | ICD-10-CM

## 2013-05-15 DIAGNOSIS — T3995XA Adverse effect of unspecified nonopioid analgesic, antipyretic and antirheumatic, initial encounter: Secondary | ICD-10-CM | POA: Diagnosis present

## 2013-05-15 DIAGNOSIS — K922 Gastrointestinal hemorrhage, unspecified: Secondary | ICD-10-CM

## 2013-05-15 DIAGNOSIS — J449 Chronic obstructive pulmonary disease, unspecified: Secondary | ICD-10-CM | POA: Diagnosis present

## 2013-05-15 DIAGNOSIS — I120 Hypertensive chronic kidney disease with stage 5 chronic kidney disease or end stage renal disease: Secondary | ICD-10-CM | POA: Diagnosis present

## 2013-05-15 DIAGNOSIS — I10 Essential (primary) hypertension: Secondary | ICD-10-CM

## 2013-05-15 DIAGNOSIS — J4489 Other specified chronic obstructive pulmonary disease: Secondary | ICD-10-CM | POA: Diagnosis present

## 2013-05-15 DIAGNOSIS — E876 Hypokalemia: Secondary | ICD-10-CM | POA: Diagnosis present

## 2013-05-15 DIAGNOSIS — F1210 Cannabis abuse, uncomplicated: Secondary | ICD-10-CM | POA: Diagnosis present

## 2013-05-15 DIAGNOSIS — R0602 Shortness of breath: Secondary | ICD-10-CM

## 2013-05-15 DIAGNOSIS — Z79899 Other long term (current) drug therapy: Secondary | ICD-10-CM

## 2013-05-15 DIAGNOSIS — F1910 Other psychoactive substance abuse, uncomplicated: Secondary | ICD-10-CM

## 2013-05-15 DIAGNOSIS — R9431 Abnormal electrocardiogram [ECG] [EKG]: Secondary | ICD-10-CM

## 2013-05-15 HISTORY — DX: Shortness of breath: R06.02

## 2013-05-15 HISTORY — DX: Headache: R51

## 2013-05-15 HISTORY — DX: Unspecified osteoarthritis, unspecified site: M19.90

## 2013-05-15 HISTORY — DX: Anemia, unspecified: D64.9

## 2013-05-15 HISTORY — DX: End stage renal disease: N18.6

## 2013-05-15 HISTORY — DX: Anxiety disorder, unspecified: F41.9

## 2013-05-15 HISTORY — DX: Hematemesis: K92.0

## 2013-05-15 HISTORY — DX: Personal history of other medical treatment: Z92.89

## 2013-05-15 HISTORY — DX: Dependence on renal dialysis: Z99.2

## 2013-05-15 LAB — POCT I-STAT, CHEM 8
BUN: 30 mg/dL — ABNORMAL HIGH (ref 6–23)
Calcium, Ion: 1.21 mmol/L (ref 1.12–1.23)
Chloride: 92 mEq/L — ABNORMAL LOW (ref 96–112)
Creatinine, Ser: 8.5 mg/dL — ABNORMAL HIGH (ref 0.50–1.35)
Glucose, Bld: 113 mg/dL — ABNORMAL HIGH (ref 70–99)
HCT: 19 % — ABNORMAL LOW (ref 39.0–52.0)
Hemoglobin: 6.5 g/dL — CL (ref 13.0–17.0)
Potassium: 3.3 mEq/L — ABNORMAL LOW (ref 3.7–5.3)
Sodium: 137 mEq/L (ref 137–147)
TCO2: 31 mmol/L (ref 0–100)

## 2013-05-15 LAB — CBC WITH DIFFERENTIAL/PLATELET
Basophils Relative: 0 % (ref 0–1)
Eosinophils Absolute: 0.1 10*3/uL (ref 0.0–0.7)
MCH: 33.7 pg (ref 26.0–34.0)
MCHC: 36 g/dL (ref 30.0–36.0)
Neutro Abs: 5.5 10*3/uL (ref 1.7–7.7)
Neutrophils Relative %: 82 % — ABNORMAL HIGH (ref 43–77)
Platelets: 209 10*3/uL (ref 150–400)
RBC: 1.84 MIL/uL — ABNORMAL LOW (ref 4.22–5.81)

## 2013-05-15 LAB — MAGNESIUM: Magnesium: 1.9 mg/dL (ref 1.5–2.5)

## 2013-05-15 LAB — PREPARE RBC (CROSSMATCH)

## 2013-05-15 LAB — OCCULT BLOOD, POC DEVICE: Fecal Occult Bld: POSITIVE — AB

## 2013-05-15 LAB — PROTIME-INR
INR: 0.93 (ref 0.00–1.49)
Prothrombin Time: 12.3 seconds (ref 11.6–15.2)

## 2013-05-15 LAB — HEPATIC FUNCTION PANEL
Albumin: 3.7 g/dL (ref 3.5–5.2)
Total Bilirubin: <0.2 mg/dL — ABNORMAL LOW (ref 0.3–1.2)
Total Protein: 7 g/dL (ref 6.0–8.3)

## 2013-05-15 LAB — APTT: aPTT: 33 seconds (ref 24–37)

## 2013-05-15 LAB — PHOSPHORUS: Phosphorus: 3.1 mg/dL (ref 2.3–4.6)

## 2013-05-15 SURGERY — CANCELLED PROCEDURE

## 2013-05-15 MED ORDER — FENTANYL CITRATE 0.05 MG/ML IJ SOLN
INTRAMUSCULAR | Status: AC
  Filled 2013-05-15: qty 2

## 2013-05-15 MED ORDER — HYDROMORPHONE HCL PF 1 MG/ML IJ SOLN
0.5000 mg | Freq: Once | INTRAMUSCULAR | Status: AC
  Administered 2013-05-15: 0.5 mg via INTRAVENOUS
  Filled 2013-05-15: qty 1

## 2013-05-15 MED ORDER — SODIUM CHLORIDE 0.9 % IJ SOLN
3.0000 mL | Freq: Two times a day (BID) | INTRAMUSCULAR | Status: DC
  Administered 2013-05-15 – 2013-05-16 (×3): 3 mL via INTRAVENOUS

## 2013-05-15 MED ORDER — MORPHINE SULFATE 4 MG/ML IJ SOLN: 4.0000 mg | Freq: Once | INTRAMUSCULAR | Status: DC

## 2013-05-15 MED ORDER — OXYCODONE-ACETAMINOPHEN 5-325 MG PO TABS
1.0000 | ORAL_TABLET | Freq: Two times a day (BID) | ORAL | Status: DC | PRN
  Administered 2013-05-15 – 2013-05-16 (×3): 1 via ORAL
  Filled 2013-05-15 (×4): qty 1

## 2013-05-15 MED ORDER — RENA-VITE PO TABS
1.0000 | ORAL_TABLET | Freq: Every day | ORAL | Status: DC
  Administered 2013-05-16: 1 via ORAL
  Filled 2013-05-15 (×3): qty 1

## 2013-05-15 MED ORDER — SODIUM CHLORIDE 0.9 % IJ SOLN: 3.0000 mL | INTRAMUSCULAR | Status: DC | PRN

## 2013-05-15 MED ORDER — SODIUM CHLORIDE 0.9 % IV SOLN: INTRAVENOUS | Status: DC

## 2013-05-15 MED ORDER — ONDANSETRON HCL 4 MG/2ML IJ SOLN
4.0000 mg | Freq: Once | INTRAMUSCULAR | Status: AC
  Administered 2013-05-15: 4 mg via INTRAVENOUS
  Filled 2013-05-15: qty 2

## 2013-05-15 MED ORDER — PANTOPRAZOLE SODIUM 40 MG IV SOLR
40.0000 mg | Freq: Every morning | INTRAVENOUS | Status: DC
  Administered 2013-05-16: 40 mg via INTRAVENOUS
  Filled 2013-05-15 (×2): qty 40

## 2013-05-15 MED ORDER — FENTANYL CITRATE 0.05 MG/ML IJ SOLN
50.0000 ug | Freq: Once | INTRAMUSCULAR | Status: AC
  Administered 2013-05-15: 50 ug via INTRAVENOUS
  Filled 2013-05-15: qty 2

## 2013-05-15 MED ORDER — DIPHENHYDRAMINE HCL 50 MG/ML IJ SOLN
INTRAMUSCULAR | Status: AC
  Filled 2013-05-15: qty 1

## 2013-05-15 MED ORDER — DIPHENHYDRAMINE HCL 50 MG/ML IJ SOLN
25.0000 mg | Freq: Once | INTRAMUSCULAR | Status: AC
  Administered 2013-05-15: 25 mg via INTRAVENOUS
  Filled 2013-05-15: qty 1

## 2013-05-15 MED ORDER — SEVELAMER CARBONATE 800 MG PO TABS
800.0000 mg | ORAL_TABLET | Freq: Three times a day (TID) | ORAL | Status: DC
  Administered 2013-05-16 (×3): 800 mg via ORAL
  Filled 2013-05-15 (×7): qty 1

## 2013-05-15 MED ORDER — ONDANSETRON HCL 4 MG PO TABS: 4.0000 mg | ORAL_TABLET | Freq: Four times a day (QID) | ORAL | Status: DC | PRN

## 2013-05-15 MED ORDER — POTASSIUM CHLORIDE CRYS ER 20 MEQ PO TBCR
20.0000 meq | EXTENDED_RELEASE_TABLET | Freq: Once | ORAL | Status: AC
  Administered 2013-05-16: 20 meq via ORAL
  Filled 2013-05-15: qty 1

## 2013-05-15 MED ORDER — HYDROXYZINE HCL 25 MG PO TABS: 25.0000 mg | ORAL_TABLET | Freq: Three times a day (TID) | ORAL | Status: DC | PRN

## 2013-05-15 MED ORDER — SODIUM CHLORIDE 0.9 % IJ SOLN: 3.0000 mL | Freq: Two times a day (BID) | INTRAMUSCULAR | Status: DC

## 2013-05-15 MED ORDER — SODIUM CHLORIDE 0.9 % IV SOLN: 250.0000 mL | INTRAVENOUS | Status: DC | PRN

## 2013-05-15 MED ORDER — HYDROMORPHONE HCL PF 1 MG/ML IJ SOLN: 1.0000 mg | Freq: Once | INTRAMUSCULAR | Status: DC

## 2013-05-15 MED ORDER — ONDANSETRON HCL 4 MG/2ML IJ SOLN: 4.0000 mg | Freq: Four times a day (QID) | INTRAMUSCULAR | Status: DC | PRN

## 2013-05-15 MED ORDER — MIDAZOLAM HCL 5 MG/ML IJ SOLN
INTRAMUSCULAR | Status: AC
  Filled 2013-05-15: qty 2

## 2013-05-15 NOTE — Progress Notes (Signed)
Pt admitted to [address] from [facility] ED. Pt is alert and oriented to staff, call bell, and room. Bed in lowest position. Call bell within reach. Full assessment to Epic. Will continue to monitor. Henry Russel, RN

## 2013-05-15 NOTE — H&P (Addendum)
[facility] History and Physical  Jeremy Mccoy [id number] DOB: [birth year] DOA: 05/15/2013  Referring physician: EDP PCP: Pcp Not In System   Chief Complaint:  Hematemesis 3days ago and Low hgb.  HPI: Jeremy Mccoy is a 47 y.o. male with PMH significant for ESRD (dialysis center in [place]), cocaine abuse, COPD who presents with the above complaints. He states that he went for dialysis today and was on the ''machine only for 3hours because that was all I could tolerate''and his hgb obtained 3days days ago came back at 6.7 and he looked pale and so was brought to the ED. He states that 3days ago he had an episode of hematemesis and felt weak and dizzy since then. He reports that at his last dialysis on Monday- He told the staff at the center and lab was done at that time and came back low today. He admits to [facility] powder use for pain because'my Drs will not prescribe me pain medications' . He denies any n/v since that episode. He also denies melena and no hematochezia. He was seen in the ED and Hgb was 6.2, and he was guaiac + on rectal exam per EDP. 2U prbcs were ordered and GI consulted. Dr Benson Norway came to do EGD but pt refused stating that he did not want him to touch him because of his 'attitude'. He states that the reason why he came to the hospital was only to get a blood transfusion. He denies CP, SOB and no leg swelling. He is admitted to [facility] for further eval and management.    Review of Systems The patient denies anorexia, fever, weight loss,, vision loss, decreased hearing, hoarseness, chest pain, syncope, dyspnea on exertion, peripheral edema, balance deficits, hemoptysis, abdominal pain, severe indigestion/heartburn, hematuria, incontinence,, suspicious skin lesions, transient blindness, difficulty walking, depression, unusual weight change.  Past Medical History  Diagnosis Date  . COPD (chronic obstructive pulmonary disease)   . Smoker   . Crack cocaine use   . Dental  caries   . End stage renal disease 01/11/2012    Patient presented 11/04/11 to hospital with CP, wt loss and N/V. Creat was 10, admitted. 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 ANA was + 1:80, +MPO and +pr3 Ab's (ANCA). Received plasmapheresis x 7, IV cytoxan and pred taper. First HD was 11/17/11. 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 Pt d/c'd to do outpt HD and may have gotten 1-2 additional Cytoxan as OP, but this was stopped eventually since renal function didn't recover. Now gets HD TTS schedule at [facility].  L forearm AVF 11/19/11 by Dr. Scot Dock is current access.    Marland Kitchen HTN (hypertension) 06/12/2012   Past Surgical History  Procedure Laterality Date  . Av fistula placement  11/29/2011    Procedure: ARTERIOVENOUS (AV) FISTULA CREATION;  Surgeon: Angelia Mould, MD;  Location: [place];  Service: Vascular;  Laterality: Left;   Social History:  reports that he has been smoking Cigarettes.  He has a 25 pack-year smoking history. He has never used smokeless tobacco. He reports that he uses illicit drugs (Cocaine and Marijuana). He reports that he does not drink alcohol.  No Known Allergies  Family History  Problem Relation Age of Onset  . Heart attack Father      Prior to Admission medications   Medication Sig Start Date End Date Taking? Authorizing Provider  acetaminophen (TYLENOL) 500 MG tablet Take 1,000 mg by mouth every 6 (six) hours as needed. pain   Yes Historical Provider, MD  amLODipine (NORVASC) 10 MG tablet Take 10 mg by mouth daily.   Yes Historical Provider, MD  famotidine (PEPCID) 20 MG tablet Take 20 mg by mouth 2 (two) times daily.   Yes Historical Provider, MD  hydrOXYzine (ATARAX/VISTARIL) 25 MG tablet Take 25 mg by mouth 3 (three) times daily as needed. itching   Yes Historical Provider, MD  losartan (COZAAR) 100 MG tablet Take 100 mg by  mouth at bedtime.   Yes Historical Provider, MD  multivitamin (RENA-VIT) TABS tablet Take 1 tablet by mouth at bedtime. 11/29/11  Yes Myriam Jacobson, PA-C  sevelamer carbonate (RENVELA) 800 MG tablet Take 800 mg by mouth 3 (three) times daily with meals.   Yes Historical Provider, MD   Physical Exam: Filed Vitals:   05/15/13 1629  BP: 144/81  Pulse: 92  Temp: 97.9 F (36.6 C)  Resp: 20    BP 144/81  Pulse 92  Temp(Src) 97.9 F (36.6 C) (Oral)  Resp 20  SpO2 100% Constitutional: Vital signs reviewed.  Patient is a well-developed and well-nourished in no acute distress and cooperative with exam. Alert and oriented x3.  Head: Normocephalic and atraumatic Mouth: no erythema or exudates, slightly dry MM Eyes: PERRL, EOMI, conjunctivae normal, No scleral icterus.  Neck: Supple, Trachea midline normal ROM, No JVD, mass, thyromegaly, or carotid bruit present.  Cardiovascular: RRR, S1 normal, S2 normal, no MRG, pulses symmetric and intact bilaterally Pulmonary/Chest: normal respiratory effort, CTAB, no wheezes, rales, or rhonchi Abdominal: Soft. Non-tender, non-distended, bowel sounds are normal, no masses, organomegaly, or guarding present.  GU: no CVA tenderness Extremities:L. Forearm AVG, no cyanosis and no edema Neurological: A&O x3, Strength is normal and symmetric bilaterally, cranial nerve II-XII are grossly intact, no focal motor deficit, sensory intact to light touch bilaterally.  Skin: Warm, dry and intact. No rash, cyanosis, or clubbing.  Psychiatric: Normal mood and affect.                Labs on Admission:  Basic Metabolic Panel:  Recent Labs Lab 05/15/13 1225 05/15/13 1244  NA  --  137  K  --  3.3*  CL  --  92*  GLUCOSE  --  113*  BUN  --  30*  CREATININE  --  8.50*  MG 1.9  --   PHOS 3.1  --    Liver Function Tests:  Recent Labs Lab 05/15/13 1242  AST 13  ALT 8  ALKPHOS 56  BILITOT <0.2*  PROT 7.0  ALBUMIN 3.7   No results found for this  basename: LIPASE, AMYLASE,  in the last 168 hours No results found for this basename: AMMONIA,  in the last 168 hours CBC:  Recent Labs Lab 05/15/13 1225 05/15/13 1244  WBC 6.7  --   NEUTROABS 5.5  --   HGB 6.2* 6.5*  HCT 17.2* 19.0*  MCV 93.5  --   PLT 209  --    Cardiac Enzymes: No results found for this basename: CKTOTAL, CKMB, CKMBINDEX, TROPONINI,  in the last 168 hours  BNP (last 3 results) No results found for this basename: PROBNP,  in the last 8760 hours CBG: No results found for this basename: GLUCAP,  in the last 168 hours  Radiological Exams on Admission: No results found.   Assessment/Plan Active Problems:   Acute blood loss anemia/Hematemesis- likely UGIB secondary to  NSAIDs -As discussed above, he declined EGD- appreciate GI assistance -will tansfuse 2U PRBCs as ordered in ED, I have consulted renal tonight given that he only was dialyzed for 3hrs and will be transfused tonight -place on PPI  -serial HH follow and further tansfuse as appropriate   End stage renal disease  -MWF dialysis -per renal- I consulted    Cocaine abuse -check UDS, follow and further manage accordingly   HTN (hypertension) -Hold BP meds for now d/t #1, follow and resume/ further manage as appropriate Hypokalemia -replace k     Code Status: full Family Communication:none at bedside Disposition Plan: admit to tele  Time spent: >23mins  [facility] Pager [phone]

## 2013-05-15 NOTE — Consult Note (Signed)
Unassigned Patient  Reason for Consult: Hematemesis Referring Physician: [facility]  Jeremy Mccoy HPI: This is a 47 year old male with multiple medical problems admitted with an anemia.  He reports having 3 episodes of hematemesis one week ago, but he did not seek further evaluation.  Subsequently, and progressively, he started to have weakness.  During dialysis he noted to look ill and he was evaluated further.  His HGB was noted to be at 6.7 g/dL and he was admitted to the hospital.  Baseline HGB is in the 8 range.  He states that he takes a significant amount of BC powder of late as nobody will give him pain medications.  He has to seek cocaine to medicate his pain and his doctors are scared that he will become an addict from pain medications.  Past Medical History  Diagnosis Date  . COPD (chronic obstructive pulmonary disease)   . Smoker   . Crack cocaine use   . Dental caries   . End stage renal disease 01/11/2012    Patient presented 11/04/11 to hospital with CP, wt loss and N/V. Creat was 10, admitted. 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 ANA was + 1:80, +MPO and +pr3 Ab's (ANCA). Received plasmapheresis x 7, IV cytoxan and pred taper. First HD was 11/17/11. 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 Pt d/c'd to do outpt HD and may have gotten 1-2 additional Cytoxan as OP, but this was stopped eventually since renal function didn't recover. Now gets HD TTS schedule at [facility].  L forearm AVF 11/19/11 by Dr. Scot Mccoy is current access.    Marland Kitchen HTN (hypertension) 06/12/2012    Past Surgical History  Procedure Laterality Date  . Av fistula placement  11/29/2011    Procedure: ARTERIOVENOUS (AV) FISTULA CREATION;  Surgeon: Angelia Mould, MD;  Location: [facility] OR;  Service: Vascular;  Laterality: Left;    Family History  Problem Relation Age of Onset   . Heart attack Father     Social History:  reports that he has been smoking Cigarettes.  He has a 25 pack-year smoking history. He has never used smokeless tobacco. He reports that he uses illicit drugs (Cocaine and Marijuana). He reports that he does not drink alcohol.  Allergies: No Known Allergies  Medications: Scheduled: Continuous:  Results for orders placed during the hospital encounter of 05/15/13 (from the past 24 hour(s))  CBC WITH DIFFERENTIAL     Status: Abnormal   Collection Time    05/15/13 12:25 PM      Result Value Range   WBC 6.7  4.0 - 10.5 K/uL   RBC 1.84 (*) 4.22 - 5.81 MIL/uL   Hemoglobin 6.2 (*) 13.0 - 17.0 g/dL   HCT 17.2 (*) 39.0 - 52.0 %   MCV 93.5  78.0 - 100.0 fL   MCH 33.7  26.0 - 34.0 pg   MCHC 36.0  30.0 - 36.0 g/dL   RDW 13.0  11.5 - 15.5 %   Platelets 209  150 - 400 K/uL   Neutrophils Relative % 82 (*) 43 - 77 %   Neutro Abs 5.5  1.7 - 7.7 K/uL   Lymphocytes Relative 10 (*) 12 - 46 %   Lymphs Abs 0.7  0.7 - 4.0 K/uL   Monocytes Relative 7  3 - 12 %   Monocytes Absolute 0.5  0.1 - 1.0  K/uL   Eosinophils Relative 1  0 - 5 %   Eosinophils Absolute 0.1  0.0 - 0.7 K/uL   Basophils Relative 0  0 - 1 %   Basophils Absolute 0.0  0.0 - 0.1 K/uL  MAGNESIUM     Status: None   Collection Time    05/15/13 12:25 PM      Result Value Range   Magnesium 1.9  1.5 - 2.5 mg/dL  PHOSPHORUS     Status: None   Collection Time    05/15/13 12:25 PM      Result Value Range   Phosphorus 3.1  2.3 - 4.6 mg/dL  TYPE AND SCREEN     Status: None   Collection Time    05/15/13 12:40 PM      Result Value Range   ABO/RH(D) O POS     Antibody Screen POS     Sample Expiration 05/18/2013     DAT, IgG NEG     Antibody Identification ANTI-E     Unit Number [id number]     Blood Component Type RED CELLS,LR     Unit division 00     Status of Unit ALLOCATED     Donor AG Type NEGATIVE FOR c ANTIGEN NEGATIVE FOR E ANTIGEN     Transfusion Status OK TO TRANSFUSE      Crossmatch Result COMPATIBLE     Unit Number [id number]     Blood Component Type RED CELLS,LR     Unit division 00     Status of Unit ALLOCATED     Donor AG Type NEGATIVE FOR c ANTIGEN NEGATIVE FOR E ANTIGEN     Transfusion Status OK TO TRANSFUSE     Crossmatch Result COMPATIBLE    PREPARE RBC (CROSSMATCH)     Status: None   Collection Time    05/15/13 12:40 PM      Result Value Range   Order Confirmation ORDER PROCESSED BY BLOOD BANK    HEPATIC FUNCTION PANEL     Status: Abnormal   Collection Time    05/15/13 12:42 PM      Result Value Range   Total Protein 7.0  6.0 - 8.3 g/dL   Albumin 3.7  3.5 - 5.2 g/dL   AST 13  0 - 37 U/L   ALT 8  0 - 53 U/L   Alkaline Phosphatase 56  39 - 117 U/L   Total Bilirubin <0.2 (*) 0.3 - 1.2 mg/dL   Bilirubin, Direct <0.2  0.0 - 0.3 mg/dL   Indirect Bilirubin NOT CALCULATED  0.3 - 0.9 mg/dL  PROTIME-INR     Status: None   Collection Time    05/15/13 12:42 PM      Result Value Range   Prothrombin Time 12.3  11.6 - 15.2 seconds   INR 0.93  0.00 - 1.49  APTT     Status: None   Collection Time    05/15/13 12:42 PM      Result Value Range   aPTT 33  24 - 37 seconds  POCT I-STAT, CHEM 8     Status: Abnormal   Collection Time    05/15/13 12:44 PM      Result Value Range   Sodium 137  137 - 147 mEq/L   Potassium 3.3 (*) 3.7 - 5.3 mEq/L   Chloride 92 (*) 96 - 112 mEq/L   BUN 30 (*) 6 - 23 mg/dL   Creatinine, Ser 8.50 (*) 0.50 - 1.35 mg/dL  Glucose, Bld 113 (*) 70 - 99 mg/dL   Calcium, Ion 1.21  1.12 - 1.23 mmol/L   TCO2 31  0 - 100 mmol/L   Hemoglobin 6.5 (*) 13.0 - 17.0 g/dL   HCT 19.0 (*) 39.0 - 52.0 %   Comment NOTIFIED PHYSICIAN    OCCULT BLOOD, POC DEVICE     Status: Abnormal   Collection Time    05/15/13 12:45 PM      Result Value Range   Fecal Occult Bld POSITIVE (*) NEGATIVE     No results found.  ROS:  As stated above in the HPI otherwise negative.  Blood pressure 144/81, pulse 92, temperature 97.9 F (36.6 C),  temperature source Oral, resp. rate 20, SpO2 100.00%.    PE: Gen: NAD, Alert and Oriented HEENT:  [place]/AT, EOMI Neck: Supple, no LAD Lungs: CTA Bilaterally CV: RRR without M/G/R ABM: Soft, NTND, +BS Ext: No C/C/E  Assessment/Plan: 1) Anemia. 2) Recent hematemesis. 3) ESRD. 4) Crack cocaine abuse.   The patient refused the procedure.  He did not like my attitude.  I had confronted him about his cocaine abuse in relation to the NSAID use.  The conversation was very convoluted and confusing with his distorted logic and his perception of the world.  He most likely has NSAID ulcerations with his history.    Plan: 1) PPI. 2) Follow HGB. 3) Transfuse if necessary. 4) Signing off.  Janaa Acero D 05/15/2013, 4:30 PM

## 2013-05-15 NOTE — ED Provider Notes (Signed)
CSN: [id number]     Arrival date & time 05/15/13  1146 History   First MD Initiated Contact with Patient 05/15/13 1209     Chief Complaint  Patient presents with  . Anemia  . Hematemesis   (Consider location/radiation/quality/duration/timing/severity/associated sxs/prior Treatment) HPI Comments: Patient here following dialysis where it was noted that his hemoglobin was 6.7 there.  He reports that on Christmas Eve he vomited 3 times with dark red blood.  He states that he filled up 3 large cupfulls of blood.  He states that he immediately felt better after vomiting, states that since then he has had no further episodes, denies nausea as well.  He reports never any abdominal pain and he denies any melena or bright red blood in stools or rectally.  He states that starting on christmas he began to have generalized weakness, worsening with any activity.  He states that he went to dialysis today, stayed on the machine 3 hours (which is his normal amount of time) when they noted that he "looked pale"  He denies any other complaints except a headache which is his normal post dialysis headache for which he takes percocet for.  He denies numbness, tingling, fever, chills, chest pain, shortness of breath, focal weakness.  Patient is a 47 y.o. male presenting with anemia. The history is provided by the patient. No language interpreter was used.  Anemia This is a new problem. The current episode started in the past 7 days. The problem occurs constantly. The problem has been unchanged. Associated symptoms include fatigue, headaches and weakness. Pertinent negatives include no abdominal pain, anorexia, chest pain, congestion, coughing, diaphoresis, fever, myalgias, nausea, neck pain, numbness, rash, swollen glands, urinary symptoms, visual change or vomiting. Nothing aggravates the symptoms. He has tried nothing for the symptoms. The treatment provided no relief.    Past Medical History  Diagnosis Date  . COPD  (chronic obstructive pulmonary disease)   . Smoker   . Crack cocaine use   . Dental caries   . End stage renal disease 01/11/2012    Patient presented 11/04/11 to hospital with CP, wt loss and N/V. Creat was 10, admitted. 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 ANA was + 1:80, +MPO and +pr3 Ab's (ANCA). Received plasmapheresis x 7, IV cytoxan and pred taper. First HD was 11/17/11. 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 Pt d/c'd to do outpt HD and may have gotten 1-2 additional Cytoxan as OP, but this was stopped eventually since renal function didn't recover. Now gets HD TTS schedule at [facility].  L forearm AVF 11/19/11 by Dr. Scot Dock is current access.    Marland Kitchen HTN (hypertension) 06/12/2012   Past Surgical History  Procedure Laterality Date  . Av fistula placement  11/29/2011    Procedure: ARTERIOVENOUS (AV) FISTULA CREATION;  Surgeon: Angelia Mould, MD;  Location: [facility] OR;  Service: Vascular;  Laterality: Left;   Family History  Problem Relation Age of Onset  . Heart attack Father    History  Substance Use Topics  . Smoking status: Current Every Day Smoker -- 1.00 packs/day for 25 years    Types: Cigarettes  . Smokeless tobacco: Never Used  . Alcohol Use: No    Review of Systems  Constitutional: Positive for fatigue. Negative for fever and diaphoresis.  HENT: Negative for congestion.   Respiratory: Negative for cough.   Cardiovascular: Negative  for chest pain.  Gastrointestinal: Negative for nausea, vomiting, abdominal pain and anorexia.  Musculoskeletal: Negative for myalgias and neck pain.  Skin: Negative for rash.  Neurological: Positive for weakness and headaches. Negative for numbness.  All other systems reviewed and are negative.    Allergies  Review of patient's allergies indicates no known allergies.  Home Medications   Current  Outpatient Rx  Name  Route  Sig  Dispense  Refill  . acetaminophen (TYLENOL) 500 MG tablet   Oral   Take 1,000 mg by mouth every 6 (six) hours as needed. pain         . amLODipine (NORVASC) 10 MG tablet   Oral   Take 10 mg by mouth daily.         Marland Kitchen HYDROcodone-acetaminophen (NORCO/VICODIN) 5-325 MG per tablet   Oral   Take 1 tablet by mouth every 4 (four) hours as needed. For pain         . hydrOXYzine (ATARAX/VISTARIL) 25 MG tablet   Oral   Take 25 mg by mouth 3 (three) times daily as needed. itching         . losartan (COZAAR) 100 MG tablet   Oral   Take 100 mg by mouth at bedtime.         . multivitamin (RENA-VIT) TABS tablet   Oral   Take 1 tablet by mouth at bedtime.         Marland Kitchen EXPIRED: pantoprazole (PROTONIX) 40 MG tablet   Oral   Take 40 mg by mouth daily at 12 noon.          BP 145/86  Pulse 122  Temp(Src) 97.7 F (36.5 C) (Oral)  Resp 20  SpO2 97% Physical Exam  Nursing note and vitals reviewed. Constitutional: He is oriented to person, place, and time. He appears well-developed and well-nourished. No distress.  HENT:  Head: Normocephalic and atraumatic.  Right Ear: External ear normal.  Left Ear: External ear normal.  Nose: Nose normal.  Mouth/Throat: Oropharynx is clear and moist. No oropharyngeal exudate.  Eyes: Pupils are equal, round, and reactive to light. No scleral icterus.  Pale conjunctiva  Neck: Normal range of motion. Neck supple.  Cardiovascular: Normal rate, regular rhythm and normal heart sounds.  Exam reveals no gallop and no friction rub.   No murmur heard. Pulmonary/Chest: Effort normal and breath sounds normal. No respiratory distress. He has no wheezes. He has no rales. He exhibits no tenderness.  Abdominal: Soft. Bowel sounds are normal. He exhibits no distension. There is no tenderness. There is no rebound and no guarding.  Genitourinary: Guaiac positive stool.  Musculoskeletal: Normal range of motion. He exhibits no  edema and no tenderness.  Left forearm vascular graft  Lymphadenopathy:    He has no cervical adenopathy.  Neurological: He is alert and oriented to person, place, and time. He exhibits normal muscle tone. Coordination normal.  Skin: Skin is warm and dry. No rash noted. No erythema. No pallor.  Psychiatric: He has a normal mood and affect. His behavior is normal. Judgment and thought content normal.    ED Course  Procedures (including critical care time) Labs Review Labs Reviewed  CBC WITH DIFFERENTIAL - Abnormal; Notable for the following:    RBC 1.84 (*)    Hemoglobin 6.2 (*)    HCT 17.2 (*)    Neutrophils Relative % 82 (*)    Lymphocytes Relative 10 (*)    All other components within normal limits  POCT I-STAT,  CHEM 8 - Abnormal; Notable for the following:    Potassium 3.3 (*)    Chloride 92 (*)    BUN 30 (*)    Creatinine, Ser 8.50 (*)    Glucose, Bld 113 (*)    Hemoglobin 6.5 (*)    HCT 19.0 (*)    All other components within normal limits  OCCULT BLOOD, POC DEVICE - Abnormal; Notable for the following:    Fecal Occult Bld POSITIVE (*)    All other components within normal limits  MAGNESIUM  PHOSPHORUS  PROTIME-INR  HEPATIC FUNCTION PANEL  TYPE AND SCREEN  PREPARE RBC (CROSSMATCH)   Imaging Review No results found.  EKG Interpretation   None      Results for orders placed during the hospital encounter of 05/15/13  CBC WITH DIFFERENTIAL      Result Value Range   WBC 6.7  4.0 - 10.5 K/uL   RBC 1.84 (*) 4.22 - 5.81 MIL/uL   Hemoglobin 6.2 (*) 13.0 - 17.0 g/dL   HCT 17.2 (*) 39.0 - 52.0 %   MCV 93.5  78.0 - 100.0 fL   MCH 33.7  26.0 - 34.0 pg   MCHC 36.0  30.0 - 36.0 g/dL   RDW 13.0  11.5 - 15.5 %   Platelets 209  150 - 400 K/uL   Neutrophils Relative % 82 (*) 43 - 77 %   Neutro Abs 5.5  1.7 - 7.7 K/uL   Lymphocytes Relative 10 (*) 12 - 46 %   Lymphs Abs 0.7  0.7 - 4.0 K/uL   Monocytes Relative 7  3 - 12 %   Monocytes Absolute 0.5  0.1 - 1.0 K/uL    Eosinophils Relative 1  0 - 5 %   Eosinophils Absolute 0.1  0.0 - 0.7 K/uL   Basophils Relative 0  0 - 1 %   Basophils Absolute 0.0  0.0 - 0.1 K/uL  MAGNESIUM      Result Value Range   Magnesium 1.9  1.5 - 2.5 mg/dL  PHOSPHORUS      Result Value Range   Phosphorus 3.1  2.3 - 4.6 mg/dL  HEPATIC FUNCTION PANEL      Result Value Range   Total Protein 7.0  6.0 - 8.3 g/dL   Albumin 3.7  3.5 - 5.2 g/dL   AST 13  0 - 37 U/L   ALT 8  0 - 53 U/L   Alkaline Phosphatase 56  39 - 117 U/L   Total Bilirubin <0.2 (*) 0.3 - 1.2 mg/dL   Bilirubin, Direct <0.2  0.0 - 0.3 mg/dL   Indirect Bilirubin NOT CALCULATED  0.3 - 0.9 mg/dL  PROTIME-INR      Result Value Range   Prothrombin Time 12.3  11.6 - 15.2 seconds   INR 0.93  0.00 - 1.49  APTT      Result Value Range   aPTT 33  24 - 37 seconds  POCT I-STAT, CHEM 8      Result Value Range   Sodium 137  137 - 147 mEq/L   Potassium 3.3 (*) 3.7 - 5.3 mEq/L   Chloride 92 (*) 96 - 112 mEq/L   BUN 30 (*) 6 - 23 mg/dL   Creatinine, Ser 8.50 (*) 0.50 - 1.35 mg/dL   Glucose, Bld 113 (*) 70 - 99 mg/dL   Calcium, Ion 1.21  1.12 - 1.23 mmol/L   TCO2 31  0 - 100 mmol/L   Hemoglobin 6.5 (*) 13.0 -  17.0 g/dL   HCT 19.0 (*) 39.0 - 52.0 %   Comment NOTIFIED PHYSICIAN    OCCULT BLOOD, POC DEVICE      Result Value Range   Fecal Occult Bld POSITIVE (*) NEGATIVE  TYPE AND SCREEN      Result Value Range   ABO/RH(D) O POS     Antibody Screen POS     Sample Expiration 05/18/2013     DAT, IgG NEG    PREPARE RBC (CROSSMATCH)      Result Value Range   Order Confirmation ORDER PROCESSED BY BLOOD BANK     No results found. '  1:33 PM HGB here 6.2 - heme positive from below - called by blood bank, + antibodies, patient will be 2 hours before getting blood, will get admitted to [facility] medicine. MDM  Anemia Upper GI bleed.  I have spoken with Dr. Benson Norway with GI who wishes to scope the patient today.  I have also spoken with Dr. Inis Sizer who will admit the  patient as well.  Vital signs are stable but will place the patient on telemetry.     Idalia Needle Joelyn Oms, PA-C 05/15/13 1440

## 2013-05-15 NOTE — ED Provider Notes (Signed)
Patient reports vomiting blood several times 05/08/2013. Since that he feels generally weak. Weakness is worse with walking. He denies other complaint except for mild headache which she typically gets after receiving hemodialysis each time. He denies abdominal pain denies chest pain denies blood per rectum. Presently alert Glasgow Coma Score 15 HEENT exam no facial asymmetry eyes conjunctiva are pale heart mildly tachycardic regular rhythm abdomen nontender all 4 service of redness or tenderness neurovascularly intact left upper extremity with dialysis fistula with good thrill. Neurologic Glasgow Coma Score 15 cranial nerves II through XII grossly intact . Moves all extremities well. Patient to receive urgent blood transfusion. Gastroenterology Dr.Hung consultation. He will be admitted to telemetry. He is symptomatic from his profound anemia   Orlie Dakin, MD 05/15/13 2039

## 2013-05-15 NOTE — ED Provider Notes (Signed)
Medical screening examination/treatment/procedure(s) were conducted as a shared visit with non-physician practitioner(s) and myself.  I personally evaluated the patient during the encounter.  EKG Interpretation    Date/Time:    Ventricular Rate:    PR Interval:    QRS Duration:   QT Interval:    QTC Calculation:   R Axis:     Text Interpretation:               Orlie Dakin, MD 05/15/13 2040

## 2013-05-15 NOTE — ED Notes (Signed)
Pt reports episode of vomiting large amounts of blood 3 days ago and having fatigue and weakness since then. Went to dialysis today, did not finish the treatment due to dizziness and hgb of 6.7.

## 2013-05-15 NOTE — Progress Notes (Signed)
CRITICAL VALUE ALERT  Critical value received:  HgB 5.9  Date of notification:  05/15/13  Time of notification:  2013 pm  Critical value read back:yes  Nurse who received alert:  J. Elisha Ponder RN  MD notified (1st page): P. Marin Comment MD   Time of first page:  2013  Responding MD:  P. LD  Time MD responded:  2020  Pt is irritable, complaining of fatigue. Appears pale upon assessment. VSS. Will continue to monitor. Velora Mediate

## 2013-05-16 DIAGNOSIS — N039 Chronic nephritic syndrome with unspecified morphologic changes: Secondary | ICD-10-CM

## 2013-05-16 DIAGNOSIS — N189 Chronic kidney disease, unspecified: Secondary | ICD-10-CM

## 2013-05-16 DIAGNOSIS — D649 Anemia, unspecified: Secondary | ICD-10-CM

## 2013-05-16 DIAGNOSIS — K920 Hematemesis: Principal | ICD-10-CM

## 2013-05-16 DIAGNOSIS — F1910 Other psychoactive substance abuse, uncomplicated: Secondary | ICD-10-CM

## 2013-05-16 DIAGNOSIS — K922 Gastrointestinal hemorrhage, unspecified: Secondary | ICD-10-CM

## 2013-05-16 DIAGNOSIS — F17200 Nicotine dependence, unspecified, uncomplicated: Secondary | ICD-10-CM

## 2013-05-16 DIAGNOSIS — D631 Anemia in chronic kidney disease: Secondary | ICD-10-CM

## 2013-05-16 LAB — HEMOGLOBIN AND HEMATOCRIT, BLOOD
HCT: 20.8 % — ABNORMAL LOW (ref 39.0–52.0)
HCT: 21.4 % — ABNORMAL LOW (ref 39.0–52.0)
Hemoglobin: 7.4 g/dL — ABNORMAL LOW (ref 13.0–17.0)
Hemoglobin: 7.6 g/dL — ABNORMAL LOW (ref 13.0–17.0)

## 2013-05-16 LAB — BASIC METABOLIC PANEL
BUN: 44 mg/dL — ABNORMAL HIGH (ref 6–23)
CO2: 27 mEq/L (ref 19–32)
Calcium: 9.6 mg/dL (ref 8.4–10.5)
Chloride: 96 mEq/L (ref 96–112)
Creatinine, Ser: 12.5 mg/dL — ABNORMAL HIGH (ref 0.50–1.35)
GFR calc Af Amer: 5 mL/min — ABNORMAL LOW (ref >90)
GFR calc non Af Amer: 4 mL/min — ABNORMAL LOW (ref >90)
Glucose, Bld: 82 mg/dL (ref 70–99)
Potassium: 4.8 mEq/L (ref 3.7–5.3)
Sodium: 137 mEq/L (ref 137–147)

## 2013-05-16 MED ORDER — ZOLPIDEM TARTRATE 5 MG PO TABS
5.0000 mg | ORAL_TABLET | Freq: Every evening | ORAL | Status: DC | PRN
  Administered 2013-05-16: 5 mg via ORAL
  Filled 2013-05-16: qty 1

## 2013-05-16 MED ORDER — CALCIUM CARBONATE 1250 MG/5ML PO SUSP
500.0000 mg | Freq: Four times a day (QID) | ORAL | Status: DC | PRN
  Administered 2013-05-16: 500 mg via ORAL
  Filled 2013-05-16: qty 5

## 2013-05-16 MED ORDER — NITROGLYCERIN 2 % TD OINT
1.0000 [in_us] | TOPICAL_OINTMENT | Freq: Three times a day (TID) | TRANSDERMAL | Status: DC
  Administered 2013-05-16 (×2): 1 [in_us] via TOPICAL
  Filled 2013-05-16: qty 30

## 2013-05-16 MED ORDER — CAMPHOR-MENTHOL 0.5-0.5 % EX LOTN
1.0000 "application " | TOPICAL_LOTION | Freq: Three times a day (TID) | CUTANEOUS | Status: DC | PRN
  Filled 2013-05-16: qty 222

## 2013-05-16 MED ORDER — DOCUSATE SODIUM 283 MG RE ENEM: 1.0000 | ENEMA | RECTAL | Status: DC | PRN

## 2013-05-16 MED ORDER — PANTOPRAZOLE SODIUM 40 MG PO TBEC: 40.0000 mg | DELAYED_RELEASE_TABLET | Freq: Every morning | ORAL | Status: DC

## 2013-05-16 MED ORDER — HYDROXYZINE HCL 25 MG PO TABS: 25.0000 mg | ORAL_TABLET | Freq: Three times a day (TID) | ORAL | Status: DC | PRN

## 2013-05-16 MED ORDER — NEPRO/CARBSTEADY PO LIQD: 237.0000 mL | Freq: Three times a day (TID) | ORAL | Status: DC | PRN

## 2013-05-16 MED ORDER — ONDANSETRON HCL 4 MG/2ML IJ SOLN: 4.0000 mg | Freq: Four times a day (QID) | INTRAMUSCULAR | Status: DC | PRN

## 2013-05-16 MED ORDER — SORBITOL 70 % SOLN: 30.0000 mL | Status: DC | PRN

## 2013-05-16 MED ORDER — ONDANSETRON HCL 4 MG PO TABS: 4.0000 mg | ORAL_TABLET | Freq: Four times a day (QID) | ORAL | Status: DC | PRN

## 2013-05-16 NOTE — Consult Note (Signed)
[facility] Renal Consultation Note  Indication for Consultation:  Management of ESRD/hemodialysis; anemia, hypertension/volume and secondary hyperparathyroidism  HPI: Jeremy Mccoy is a 48 y.o. male admitted with an anemia Hgb 6.7 at [facility] and reports having 3 episodes of hematemesis one week ago, but he did not seek  evaluation.He became progressively,weaker and at dialysis he " looked Pale and noted hgb with monthly ck was 6.7 compared to 10.5 on Dec.22 and 11.4 on Dec 18th.  And was sent to the ER for Evaluation.Marland KitchenHe reports he taking a "daily  multiple BC powders" for pain medications and reports  taking cocaine to medicate his pain. He has been more compliant recently with op HD attending most of his treatment time. Noted he refused GI eval."just want Blood transfusion." I discussed with him need to evaluate for active GI bleed with NSAID use and need for heparin on hd when bleeding stable, and the need for PCP MD and Pain med evaluation as outpatient. He reports to me, he is open to GI evaluation with EGD if needed     Past Medical History  Diagnosis Date  . COPD (chronic obstructive pulmonary disease)   . Crack cocaine use   . Dental caries   . HTN (hypertension) 06/12/2012  . Hematemesis/vomiting blood   . Shortness of breath     "just when I have too much potassium is too high" (05/15/2013)  . Anemia   . History of blood transfusion 10/2011; 04/2013  . Headache(784.0)     "get it from going to dialysis; when they get real bad I come to hospital" (05/15/2013)  . Arthritis     "qwhere" (05/15/2013)  . Anxiety   . End stage renal disease 01/11/2012    Patient presented 11/04/11 to hospital with CP, wt loss and N/V. Creat was 10, admitted. 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 ANA was + 1:80, +MPO and +pr3 Ab's (ANCA). Received plasmapheresis x 7, IV cytoxan and pred taper. First HD was 11/17/11. 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 Pt d/c'd to do outpt HD and may have gotten 1-2 additional Cytoxan as OP, but this was stopped eventually since renal function didn't recover. Now gets HD TTS schedule at [facility].  L forearm AVF 11/19/11 by Dr. Scot Dock is current access.    Marland Kitchen ESRD (end stage renal disease) on dialysis     TTS: [address] [place] (05/15/2013)    Past Surgical History  Procedure Laterality Date  . Av fistula placement  11/29/2011    Procedure: ARTERIOVENOUS (AV) FISTULA CREATION;  Surgeon: Angelia Mould, MD;  Location: [place];  Service: Vascular;  Laterality: Left;  . Renal biopsy  11/07/2011  . Inguinal hernia repair Bilateral 1967      Family History  Problem Relation Age of Onset  . Heart attack Father       reports that he has been smoking Cigarettes.  He has a 35 pack-year smoking history. He has never used smokeless tobacco. He reports that he drinks alcohol. He reports that he uses illicit drugs (Cocaine and Marijuana).  No Known Allergies  Prior to Admission medications   Medication Sig Start Date End Date Taking? Authorizing Provider  acetaminophen (TYLENOL) 500 MG tablet Take 1,000 mg by mouth every 6 (six) hours as needed. pain   Yes Historical Provider, MD  amLODipine (NORVASC)  10 MG tablet Take 10 mg by mouth daily.   Yes Historical Provider, MD  famotidine (PEPCID) 20 MG tablet Take 20 mg by mouth 2 (two) times daily.   Yes Historical Provider, MD  hydrOXYzine (ATARAX/VISTARIL) 25 MG tablet Take 25 mg by mouth 3 (three) times daily as needed. itching   Yes Historical Provider, MD  losartan (COZAAR) 100 MG tablet Take 100 mg by mouth at bedtime.   Yes Historical Provider, MD  multivitamin (RENA-VIT) TABS tablet Take 1 tablet by mouth at bedtime. 11/29/11  Yes Myriam Jacobson, PA-C  sevelamer carbonate (RENVELA) 800 MG tablet Take 800 mg by mouth 3 (three) times daily  with meals.   Yes Historical Provider, MD     Anti-infectives   None      Results for orders placed during the hospital encounter of 05/15/13 (from the past 48 hour(s))  CBC WITH DIFFERENTIAL     Status: Abnormal   Collection Time    05/15/13 12:25 PM      Result Value Range   WBC 6.7  4.0 - 10.5 K/uL   RBC 1.84 (*) 4.22 - 5.81 MIL/uL   Hemoglobin 6.2 (*) 13.0 - 17.0 g/dL   Comment: REPEATED TO VERIFY     CRITICAL RESULT CALLED TO, READ BACK BY AND VERIFIED WITH:     CHRIS ERICSON,RN [id number] SHIPMANM   HCT 17.2 (*) 39.0 - 52.0 %   MCV 93.5  78.0 - 100.0 fL   MCH 33.7  26.0 - 34.0 pg   MCHC 36.0  30.0 - 36.0 g/dL   RDW 13.0  11.5 - 15.5 %   Platelets 209  150 - 400 K/uL   Neutrophils Relative % 82 (*) 43 - 77 %   Neutro Abs 5.5  1.7 - 7.7 K/uL   Lymphocytes Relative 10 (*) 12 - 46 %   Lymphs Abs 0.7  0.7 - 4.0 K/uL   Monocytes Relative 7  3 - 12 %   Monocytes Absolute 0.5  0.1 - 1.0 K/uL   Eosinophils Relative 1  0 - 5 %   Eosinophils Absolute 0.1  0.0 - 0.7 K/uL   Basophils Relative 0  0 - 1 %   Basophils Absolute 0.0  0.0 - 0.1 K/uL  MAGNESIUM     Status: None   Collection Time    05/15/13 12:25 PM      Result Value Range   Magnesium 1.9  1.5 - 2.5 mg/dL  PHOSPHORUS     Status: None   Collection Time    05/15/13 12:25 PM      Result Value Range   Phosphorus 3.1  2.3 - 4.6 mg/dL  TYPE AND SCREEN     Status: None   Collection Time    05/15/13 12:40 PM      Result Value Range   ABO/RH(D) O POS     Antibody Screen POS     Sample Expiration 05/18/2013     DAT, IgG NEG     Antibody Identification ANTI-E     Unit Number [id number]     Blood Component Type RED CELLS,LR     Unit division 00     Status of Unit ISSUED     Donor AG Type NEGATIVE FOR c ANTIGEN NEGATIVE FOR E ANTIGEN     Transfusion Status OK TO TRANSFUSE     Crossmatch Result COMPATIBLE     Unit Number [id number]     Blood Component Type RED CELLS,LR  Unit division 00     Status of  Unit ISSUED,FINAL     Donor AG Type NEGATIVE FOR c ANTIGEN NEGATIVE FOR E ANTIGEN     Transfusion Status OK TO TRANSFUSE     Crossmatch Result COMPATIBLE    PREPARE RBC (CROSSMATCH)     Status: None   Collection Time    05/15/13 12:40 PM      Result Value Range   Order Confirmation ORDER PROCESSED BY BLOOD BANK    HEPATIC FUNCTION PANEL     Status: Abnormal   Collection Time    05/15/13 12:42 PM      Result Value Range   Total Protein 7.0  6.0 - 8.3 g/dL   Albumin 3.7  3.5 - 5.2 g/dL   AST 13  0 - 37 U/L   ALT 8  0 - 53 U/L   Alkaline Phosphatase 56  39 - 117 U/L   Total Bilirubin <0.2 (*) 0.3 - 1.2 mg/dL   Comment: REPEATED TO VERIFY   Bilirubin, Direct <0.2  0.0 - 0.3 mg/dL   Comment: REPEATED TO VERIFY   Indirect Bilirubin NOT CALCULATED  0.3 - 0.9 mg/dL  PROTIME-INR     Status: None   Collection Time    05/15/13 12:42 PM      Result Value Range   Prothrombin Time 12.3  11.6 - 15.2 seconds   INR 0.93  0.00 - 1.49  APTT     Status: None   Collection Time    05/15/13 12:42 PM      Result Value Range   aPTT 33  24 - 37 seconds  POCT I-STAT, CHEM 8     Status: Abnormal   Collection Time    05/15/13 12:44 PM      Result Value Range   Sodium 137  137 - 147 mEq/L   Potassium 3.3 (*) 3.7 - 5.3 mEq/L   Chloride 92 (*) 96 - 112 mEq/L   BUN 30 (*) 6 - 23 mg/dL   Creatinine, Ser 8.50 (*) 0.50 - 1.35 mg/dL   Glucose, Bld 113 (*) 70 - 99 mg/dL   Calcium, Ion 1.21  1.12 - 1.23 mmol/L   TCO2 31  0 - 100 mmol/L   Hemoglobin 6.5 (*) 13.0 - 17.0 g/dL   HCT 19.0 (*) 39.0 - 52.0 %   Comment NOTIFIED PHYSICIAN    OCCULT BLOOD, POC DEVICE     Status: Abnormal   Collection Time    05/15/13 12:45 PM      Result Value Range   Fecal Occult Bld POSITIVE (*) NEGATIVE  HEMOGLOBIN AND HEMATOCRIT, BLOOD     Status: Abnormal   Collection Time    05/15/13  7:15 PM      Result Value Range   Hemoglobin 5.9 (*) 13.0 - 17.0 g/dL   Comment: REPEATED TO VERIFY     CRITICAL RESULT CALLED TO,  READ BACK BY AND VERIFIED WITH:     RN J. [facility] 05/15/13 2007 KERAN M.   HCT 16.6 (*) 39.0 - 52.0 %  HEMOGLOBIN AND HEMATOCRIT, BLOOD     Status: Abnormal   Collection Time    05/16/13  7:20 AM      Result Value Range   Hemoglobin 7.4 (*) 13.0 - 17.0 g/dL   Comment: POST TRANSFUSION SPECIMEN   HCT 20.8 (*) 39.0 - 52.0 %    ROS: positives as in hpi   Physical Exam: Filed Vitals:   05/16/13 0414  BP: 149/96  Pulse: 87  Temp: 98.6 F (37 C)  Resp: 18     General: alert  WM Nad, eating Breakfast, mother in room. HEENT: [place] ,MMM Eyes:  eomi Neck:  Supple , no jvd Heart: RRR no rub or mur. Lungs:  CTA  Abdomen: BS pos. , soft, non tender, nondistended Extremities: No pedal edema Skin: No overt rash or ulcer Neuro: Alert. OX3, no acute focal deficits noted Dialysis Access: Pos. Bruit L UA AVF  Dialysis Orders: Center: [facility]  on TTS ( holiday =mwsat) . EDW 71kg HD Bath 1.0 k, 2.5 ca  Time  4hrs 15 min Heparin 7,400. Access L fa avf BFR 450  DFR autoflow 1.5     Hectorol 2 mcg IV/HD Epogen 5400   Units IV/HD  Venofer  0  Other op labs= 12/14  tfs = 66%, hgb  6.7, pth 303, ca 10.3, phos 8.5  Assessment/Plan 1. Acute Anemia/ Upper GI bleed( ?NSAID abuse) - TH TX/ GI eval, sp transfusion hgb stable 2. ESRD -  HD TTS [facility]  , next hd Sat. With  Holiday schedule, NO HEP  With hd  For now/  K 3.3 3. Hypertension/volume  - On amlodipine 10 mg hs and Losartan 100 mg q day as op / vol appears stable/ no am wt. At dry wt today 4. Anemia  - chronic disease and per #1/ esa incr recently at op 5400 was incr to 10,000/ no venofer 5. Metabolic bone disease -  Binders and vit D. He reports drinking high phos drinks  6. Drug abuse/  Chronic Pain syndrome - needs PCP MD  And Pt. Compliance see below  Ernest Haber, PA-C [place] [phone] 05/16/2013, 8:54 AM   I have seen and examined patient, discussed with PA and agree with assessment and plan as outlined  above.  Pt here with anemia and one episode hematemesis, has been using BC's for chronic HA's recently which may be culprit.  Has problems with narcotic and cocaine dependence and is trying to get off of the cocaine in particular and is making progress according to patient.  Needs a pain specialist, renal and PCP physicians will not prescribe pain meds according to patient so he has to get them off the street.  I have given him phone number of pain specialist, Dr Letta Pate.  Kelly Splinter MD pager [phone]    cell [phone] 05/16/2013, 12:46 PM

## 2013-05-16 NOTE — Progress Notes (Signed)
Received message for leads off/artifact, patient not in room. Pt then seen returning to unit from elevator. MD notified. Explained to pt that he does not currently have off unit priviledges. Will continue to monitor.

## 2013-05-16 NOTE — Progress Notes (Signed)
Subjective: Per pt states, "I am still using street drugs. I am currently using cocaine and I will probably go back to it once I get out of this place. I'm going to walk out in the morning once my blood finishes." Patient educated, refused teaching. However, patient remains compliant with medications. Will continue to monitor. Velora Mediate

## 2013-05-16 NOTE — Progress Notes (Signed)
[facility]                                                                              Patient Demographics  Jeremy Mccoy, is a 48 y.o. male, DOB - November 06, 1965, [id number]  Admit date - 05/15/2013   Admitting Physician Beryle Beams, MD  Outpatient Primary MD for the patient is Pcp Not In System  LOS - 1   Chief Complaint  Patient presents with  . Anemia  . Hematemesis        Assessment & Plan    Active Problems:   End stage renal disease   Cocaine abuse   HTN (hypertension)   Acute blood loss anemia   Hematemesis  Anemia secondary to upper GI bleed/hematemesis -Possibly secondary to the use of NSAIDs -positive FOBT -Will consult gastroenterology for further management, patient now wants an EGD -Patient received 2 units packed red blood cells -Continue protonix  End-stage procedure for hemodialysis -Nephrology consulted -Patient normally dialyzes Monday Wednesday Friday -Continue renvela, renavit  Hypertension -HTN meds held due to drug abuse -Will order tox screen, and restart BP meds afterwards  Drug abuse, cocaine -Counseled, patient does state he plans to continue using drugs -Pending urine tox screen  Hypokalemia -Resolved, continue to monitor  Code Status: Full  Family Communication: None at bedside  Disposition Plan: Admitted, patient has left he floor several times and has been warned that he is to leave again, he will need to sign out AMA.   Procedures None  Consults   Gastroenterology Nephrology  DVT Prophylaxis  SCDs  Lab Results  Component Value Date   PLT 209 05/15/2013    Medications  Scheduled Meds: . multivitamin  1 tablet Oral QHS  . nitroGLYCERIN  1 inch Topical Q8H  . [START ON 05/17/2013] pantoprazole  40 mg Oral q morning - 10a  . sevelamer carbonate  800 mg Oral TID WC  . sodium chloride  3 mL Intravenous Q12H  . sodium chloride  3 mL Intravenous Q12H   Continuous Infusions:  PRN Meds:.sodium  chloride, hydrOXYzine, ondansetron (ZOFRAN) IV, ondansetron, oxyCODONE-acetaminophen, sodium chloride  Antibiotics    Anti-infectives   None       Time Spent in minutes   30 minutes   Shawnita Krizek D.O. on 05/16/2013 at 12:33 PM  Between 7am to 7pm - Pager - [phone]  After 7pm go to [email or web address] - password [email or web address]  And look for the night coverage person covering for me after hours  [facility] Office  [phone]    Subjective:   Jeremy Mccoy seen and examined today.  Patient has no complaints at this time, wishes to leave however understand that he may have a bleeding ulcer. Patient denies any abdominal pain at this time.  Objective:   Filed Vitals:   05/16/13 0229 05/16/13 0329 05/16/13 0414 05/16/13 0958  BP: 145/100 147/94 149/96 140/94  Pulse: 98 88 87 103  Temp: 98.2 F (36.8 C) 98.4 F (36.9 C) 98.6 F (37 C) 98.3 F (36.8 C)  TempSrc: Oral Oral Oral Oral  Resp: 18 16 18 18   SpO2: 98% 98% 100% 100%    Wt Readings from Last 3 Encounters:  09/21/12 70.3 kg (154 lb 15.7 oz)  06/14/12 68.4 kg (150 lb 12.7 oz)  04/16/12 69.6 kg (153 lb 7 oz)     Intake/Output Summary (Last 24 hours) at 05/16/13 1233 Last data filed at 05/16/13 0855  Gross per 24 hour  Intake    645 ml  Output      0 ml  Net    645 ml    Exam  General: Well developed, well nourished, NAD, appears stated age  HEENT: NCAT, PERRLA, EOMI, Anicteic Sclera, mucous membranes moist. No pharyngeal erythema or exudates  Neck: Supple, no JVD, no masses  Cardiovascular: S1 S2 auscultated, no rubs, murmurs or gallops. Regular rate and rhythm.  Respiratory: Clear to auscultation bilaterally with equal chest rise  Abdomen: Soft, nontender, nondistended, + bowel sounds  Extremities: warm dry without cyanosis clubbing or edema  Neuro: AAOx3, cranial nerves grossly intact. Strength 5/5 in patient's upper and lower extremities bilaterally, left upper extremity AV fistula  with good thrill and bruit  Skin: Without rashes exudates or nodules  Psych: Normal affect and demeanor with intact judgement and insight  Data Review   Micro Results No results found for this or any previous visit (from the past 240 hour(s)).  Radiology Reports No results found.  CBC  Recent Labs Lab 05/15/13 1225 05/15/13 1244 05/15/13 1915 05/16/13 0720  WBC 6.7  --   --   --   HGB 6.2* 6.5* 5.9* 7.4*  HCT 17.2* 19.0* 16.6* 20.8*  PLT 209  --   --   --   MCV 93.5  --   --   --   MCH 33.7  --   --   --   MCHC 36.0  --   --   --   RDW 13.0  --   --   --   LYMPHSABS 0.7  --   --   --   MONOABS 0.5  --   --   --   EOSABS 0.1  --   --   --   BASOSABS 0.0  --   --   --     Chemistries   Recent Labs Lab 05/15/13 1225 05/15/13 1242 05/15/13 1244 05/16/13 0720  NA  --   --  137 137  K  --   --  3.3* 4.8  CL  --   --  92* 96  CO2  --   --   --  27  GLUCOSE  --   --  113* 82  BUN  --   --  30* 44*  CREATININE  --   --  8.50* 12.50*  CALCIUM  --   --   --  9.6  MG 1.9  --   --   --   AST  --  13  --   --   ALT  --  8  --   --   ALKPHOS  --  56  --   --   BILITOT  --  <0.2*  --   --    ------------------------------------------------------------------------------------------------------------------ CrCl is unknown because both a height and weight (above a minimum accepted value) are required for this calculation. ------------------------------------------------------------------------------------------------------------------ No results found for this basename: HGBA1C,  in the last 72 hours ------------------------------------------------------------------------------------------------------------------ No results found for this basename: CHOL, HDL, LDLCALC, TRIG, CHOLHDL, LDLDIRECT,  in the last 72 hours ------------------------------------------------------------------------------------------------------------------ No results found for this basename: TSH,  T4TOTAL, FREET3, T3FREE, THYROIDAB,  in the last 72 hours ------------------------------------------------------------------------------------------------------------------  No results found for this basename: VITAMINB12, FOLATE, FERRITIN, TIBC, IRON, RETICCTPCT,  in the last 72 hours  Coagulation profile  Recent Labs Lab 05/15/13 1242  INR 0.93    No results found for this basename: DDIMER,  in the last 72 hours  Cardiac Enzymes No results found for this basename: CK, CKMB, TROPONINI, MYOGLOBIN,  in the last 168 hours ------------------------------------------------------------------------------------------------------------------ No components found with this basename: POCBNP,

## 2013-05-16 NOTE — Progress Notes (Signed)
CMT notified leads off, went to check leads and pt was not in room, tele box on bed. Pt seen getting in elevator with family member in plain clothes. MD notified that pt is off unit, CMT notified to place pt on standby. Will continue to monitor.

## 2013-05-16 NOTE — Progress Notes (Signed)
Patient back in room. MD notified. Will continue to monitor.

## 2013-05-16 NOTE — Progress Notes (Signed)
Pt has bp of 145/100 while receiving 2nd unit of blood. P. Le MD notified. Per MD order, give nitro paste stat while continuing blood transfusion. Pt is asymptomatic without distress or pain at this time. Will continue to monitor. Velora Mediate

## 2013-05-17 ENCOUNTER — Encounter (HOSPITAL_COMMUNITY)
Admission: EM | Disposition: A | Discharge status: Home / Self Care | Payer: Self-pay | Source: Home / Self Care | Attending: Internal Medicine

## 2013-05-17 ENCOUNTER — Encounter (HOSPITAL_COMMUNITY): Payer: Self-pay | Admitting: *Deleted

## 2013-05-17 DIAGNOSIS — H5359 Other color vision deficiencies: Secondary | ICD-10-CM

## 2013-05-17 HISTORY — PX: ESOPHAGOGASTRODUODENOSCOPY: SHX5428

## 2013-05-17 LAB — TYPE AND SCREEN
ABO/RH(D): O POS
Antibody Screen: POSITIVE
DAT, IgG: NEGATIVE
Donor AG Type: NEGATIVE
Donor AG Type: NEGATIVE
Unit division: 0
Unit division: 0

## 2013-05-17 LAB — BASIC METABOLIC PANEL
BUN: 64 mg/dL — ABNORMAL HIGH (ref 6–23)
CO2: 21 mEq/L (ref 19–32)
Calcium: 9.7 mg/dL (ref 8.4–10.5)
Chloride: 95 mEq/L — ABNORMAL LOW (ref 96–112)
Creatinine, Ser: 15.66 mg/dL — ABNORMAL HIGH (ref 0.50–1.35)
GFR, EST AFRICAN AMERICAN: 4 mL/min — AB (ref >90)
GFR, EST NON AFRICAN AMERICAN: 3 mL/min — AB (ref >90)
Glucose, Bld: 80 mg/dL (ref 70–99)
POTASSIUM: 5.5 meq/L — AB (ref 3.7–5.3)
SODIUM: 139 meq/L (ref 137–147)

## 2013-05-17 LAB — CBC
HCT: 20.8 % — ABNORMAL LOW (ref 39.0–52.0)
Hemoglobin: 7.4 g/dL — ABNORMAL LOW (ref 13.0–17.0)
MCH: 33.5 pg (ref 26.0–34.0)
MCHC: 35.6 g/dL (ref 30.0–36.0)
MCV: 94.1 fL (ref 78.0–100.0)
Platelets: 192 10*3/uL (ref 150–400)
RBC: 2.21 MIL/uL — ABNORMAL LOW (ref 4.22–5.81)
RDW: 13.6 % (ref 11.5–15.5)
WBC: 4.5 10*3/uL (ref 4.0–10.5)

## 2013-05-17 SURGERY — EGD (ESOPHAGOGASTRODUODENOSCOPY)
Anesthesia: Moderate Sedation

## 2013-05-17 MED ORDER — MIDAZOLAM HCL 10 MG/2ML IJ SOLN
INTRAMUSCULAR | Status: DC | PRN
  Administered 2013-05-17 (×3): 2 mg via INTRAVENOUS

## 2013-05-17 MED ORDER — PANTOPRAZOLE SODIUM 40 MG PO TBEC: 40.0000 mg | DELAYED_RELEASE_TABLET | Freq: Every morning | ORAL | Status: DC

## 2013-05-17 MED ORDER — FENTANYL CITRATE 0.05 MG/ML IJ SOLN
INTRAMUSCULAR | Status: DC | PRN
  Administered 2013-05-17 (×3): 25 ug via INTRAVENOUS

## 2013-05-17 MED ORDER — LOSARTAN POTASSIUM 50 MG PO TABS
100.0000 mg | ORAL_TABLET | Freq: Every day | ORAL | Status: DC
  Filled 2013-05-17: qty 2

## 2013-05-17 MED ORDER — DIPHENHYDRAMINE HCL 50 MG/ML IJ SOLN
INTRAMUSCULAR | Status: DC | PRN
  Administered 2013-05-17: 25 mg via INTRAVENOUS

## 2013-05-17 MED ORDER — CALCIUM CARBONATE 1250 MG/5ML PO SUSP: 500.0000 mg | Freq: Four times a day (QID) | ORAL | Status: DC | PRN

## 2013-05-17 MED ORDER — DIPHENHYDRAMINE HCL 50 MG/ML IJ SOLN
25.0000 mg | Freq: Once | INTRAMUSCULAR | Status: AC
Start: 2013-05-17 — End: 2013-05-17
  Administered 2013-05-17: 25 mg via INTRAVENOUS

## 2013-05-17 MED ORDER — FENTANYL CITRATE 0.05 MG/ML IJ SOLN
INTRAMUSCULAR | Status: AC
  Filled 2013-05-17: qty 4

## 2013-05-17 MED ORDER — MIDAZOLAM HCL 5 MG/ML IJ SOLN
INTRAMUSCULAR | Status: AC
  Filled 2013-05-17: qty 2

## 2013-05-17 MED ORDER — SODIUM CHLORIDE 0.9 % IV SOLN
INTRAVENOUS | Status: DC
  Administered 2013-05-17 (×2): 500 mL via INTRAVENOUS

## 2013-05-17 MED ORDER — AMLODIPINE BESYLATE 10 MG PO TABS
10.0000 mg | ORAL_TABLET | Freq: Every day | ORAL | Status: DC
  Filled 2013-05-17: qty 1

## 2013-05-17 MED ORDER — DIPHENHYDRAMINE HCL 50 MG/ML IJ SOLN
INTRAMUSCULAR | Status: AC
  Filled 2013-05-17: qty 1

## 2013-05-17 MED ORDER — NEPRO/CARBSTEADY PO LIQD: 237.0000 mL | Freq: Three times a day (TID) | ORAL | Status: DC | PRN

## 2013-05-17 NOTE — Progress Notes (Signed)
Pt requesting papers to leave, upset about not receiving home medications and not being told about EGD today and being given food/drink last night by staff if he should have been NPO. Explained that pt was not put on the schedule for procedure until this AM. MD notified pt wants to leave, IV removed. Brought AMA papers, pt now refusing to sign, states he won't sign anything that will make his insurance not pay for this admission. Explained to patient that we will attempt to reconcile home medications and that if he would like to remain NPO he is on the board for procedure this afternoon. Pt agreeable to wait at this time, sitting in bed quietly. Will continue to monitor.

## 2013-05-17 NOTE — Interval H&P Note (Signed)
History and Physical Interval Note:  05/17/2013 12:19 PM  Jeremy Mccoy  has presented today for surgery, with the diagnosis of Anemia, Hematemesis, Heme positive stool  The various methods of treatment have been discussed with the patient and family. After consideration of risks, benefits and other options for treatment, the patient has consented to  Procedure(s): ESOPHAGOGASTRODUODENOSCOPY (EGD) (N/A) as a surgical intervention .  The patient's history has been reviewed, patient examined, no change in status, stable for surgery.  I have reviewed the patient's chart and labs.  Questions were answered to the patient's satisfaction.     [name] D

## 2013-05-17 NOTE — Discharge Instructions (Signed)
End-Stage Kidney Disease The kidneys are two organs that lie on either side of the spine between the middle of the back and the front of the abdomen. The kidneys:   Remove wastes and extra water from the blood.   Produce important hormones. These help keep bones strong, regulate blood pressure, and help create red blood cells.   Balance the fluids and chemicals in the blood and tissues. End-stage kidney disease occurs when the kidneys are so damaged that they cannot do their job. When the kidneys cannot do their job, life-threatening problems occur. The body cannot stay clean and strong without the help of the kidneys. In end-stage kidney disease, the kidneys cannot get better.You need a new kidney or treatments to do some of the work healthy kidneys do in order to stay alive. CAUSES  End-stage kidney disease usually occurs when a long-lasting (chronic) kidney disease gets worse. It may also occur after the kidneys are suddenly damaged (acute kidney injury).  SYMPTOMS   Swelling (edema) of the legs, ankles, or feet.   Tiredness (lethargy).   Nausea or vomiting.   Confusion.   Problems with urination, such as:   Decreased urine production.   Frequent urination, especially at night.   Frequent accidents in children who are potty trained.   Muscle twitches and cramps.   Persistent itchiness.   Loss of appetite.   Headaches.   Abnormally dark or light skin.   Numbness in the hands or feet.   Easy bruising.   Frequent hiccups.   Menstruation stops. DIAGNOSIS  Your caregiver will measure your blood pressure and take some tests. These may include:   Urine tests.   Blood tests.   Imaging tests, such as:   An ultrasound exam.   Computed tomography (CT).  A kidney biopsy. TREATMENT  There are two treatments for end-stage kidney disease:   A procedure that removes toxic wastes from the body (dialysis).   Receiving a new kidney (kidney  transplant). Both of these treatments have serious risks and consequences. Your caregiver will help you determine which treatment is best for you based on your health, age, and other factors. In addition to having dialysis or a kidney transplant, you may need to take medicines to control high blood pressure (hypertension) and cholesterol and to decrease phosphorus levels in your blood.  HOME CARE INSTRUCTIONS  Follow your prescribed diet.   Only take over-the-counter or prescription medicines as directed by your caregiver.   Do not take any new medicines (prescription, over-the-counter, or nutritional supplements) unless approved by your caregiver. Many medicines can worsen your kidney damage or need to have the dose adjusted.   Keep all follow-up appointments. MAKE SURE YOU:  Understand these instructions.  Will watch your condition.  Will get help right away if you are not doing well or get worse Document Released: 07/23/2003 Document Revised: 04/18/2012 Document Reviewed: 12/30/2011 [facility] Patient Information 2014 [place].

## 2013-05-17 NOTE — H&P (View-Only) (Signed)
Pt requesting papers to leave, upset about not receiving home medications and not being told about EGD today and being given food/drink last night by staff if he should have been NPO. Explained that pt was not put on the schedule for procedure until this AM. MD notified pt wants to leave, IV removed. Brought AMA papers, pt now refusing to sign, states he won't sign anything that will make his insurance not pay for this admission. Explained to patient that we will attempt to reconcile home medications and that if he would like to remain NPO he is on the board for procedure this afternoon. Pt agreeable to wait at this time, sitting in bed quietly. Will continue to monitor.

## 2013-05-17 NOTE — Progress Notes (Signed)
Subjective:  Anxious about EGD timing and npo/ noted  Numerous trips off floor since admit and threatening to Leave AMA BUT will not sign ama papers/ aggress to stay for egd Objective Vital signs in last 24 hours: Filed Vitals:   05/16/13 1335 05/16/13 1700 05/16/13 2052 05/17/13 0427  BP: 149/90 140/92 151/94 160/89  Pulse: 95 81 87 88  Temp: 97.8 F (36.6 C) 98.3 F (36.8 C) 98.8 F (37.1 C) 97.9 F (36.6 C)  TempSrc: Oral Oral Oral Oral  Resp: 20 18 18 18   Height: 6' (1.829 m)  6' (1.829 m)   Weight:   75.3 kg (166 lb 0.1 oz)   SpO2: 100% 100% 96% 97%   Weight change:   Intake/Output Summary (Last 24 hours) at 05/17/13 0956 Last data filed at 05/17/13 0436  Gross per 24 hour  Intake    840 ml  Output      0 ml  Net    840 ml   Labs: Basic Metabolic Panel:  Recent Labs Lab 05/15/13 1225 05/15/13 1244 05/16/13 0720  NA  --  137 137  K  --  3.3* 4.8  CL  --  92* 96  CO2  --   --  27  GLUCOSE  --  113* 82  BUN  --  30* 44*  CREATININE  --  8.50* 12.50*  CALCIUM  --   --  9.6  PHOS 3.1  --   --    Liver Function Tests:  Recent Labs Lab 05/15/13 1242  AST 13  ALT 8  ALKPHOS 56  BILITOT <0.2*  PROT 7.0  ALBUMIN 3.7   No results found for this basename: LIPASE, AMYLASE,  in the last 168 hours No results found for this basename: AMMONIA,  in the last 168 hours CBC:  Recent Labs Lab 05/15/13 1225  05/16/13 0720 05/16/13 1214 05/17/13 0500  WBC 6.7  --   --   --  4.5  NEUTROABS 5.5  --   --   --   --   HGB 6.2*  < > 7.4* 7.6* 7.4*  HCT 17.2*  < > 20.8* 21.4* 20.8*  MCV 93.5  --   --   --  94.1  PLT 209  --   --   --  192  < > = values in this interval not displayed. Cardiac Enzymes: No results found for this basename: CKTOTAL, CKMB, CKMBINDEX, TROPONINI,  in the last 168 hours CBG: No results found for this basename: GLUCAP,  in the last 168 hours  Iron Studies: No results found for this basename: IRON, TIBC, TRANSFERRIN, FERRITIN,  in the last  72 hours Studies/Results: No results found. Medications:   . multivitamin  1 tablet Oral QHS  . pantoprazole  40 mg Oral q morning - 10a  . sevelamer carbonate  800 mg Oral TID WC     Physical Exam: General: alert WM Nad Heart: RRR no rub or mur.  Lungs: CTA  Abdomen: BS pos. , soft, non tender, nondistended  Extremities: No pedal edema  Dialysis Access: Pos. Bruit L UA AVF   Dialysis: [facility] 4hr 15 min   71kg    1K/2.5Ca Bath   Heparin 7400   LFA AVF  BFR 450 Hectorol 2   Epogen 5400    Venofer 0  Labs= 12/14 tfs = 66%, hgb 6.7, pth 303, ca 10.3, phos 8.5   Assessment/Plan  1. Acute Anemia/ Upper GI bleed(  Probable  NSAID induced ) - TH TX/ GI eval, sp transfusion hgb stable but still low / For EGD today per  Notes/ hgb admit 6.2 > 2 u prbcs >7.4> 7.6> 7.4 this am 2. ESRD - HD T[facility] , next hd Sat. With Holiday schedule, NO HEP With hd For now/ K 4.8 yesterdfay /reck with next bld draw 3. Hypertension/volume -  160/89 bp  And wt 75.3 kg/ no sob/ pt leaving floor often past 24 hrs/On amlodipine 10 mg hs and Losartan 100 mg q day as op // Continue meds in hosp. 4. Anemia - chronic disease and per #1/ esa incr recently at op 5400 was incr to 10,000/ no venofer 5. Metabolic bone disease - Renvela 1 ac and vit D. He reports drinking high phos drinks as op   Phos 3.1 on admit/ Ca corrected 9.9 6. Drug abuse/ Chronic Pain syndrome - needs PCP MD And Pt. Compliance/ pt given Dr. Letta Pate  Phone # yesterday for Pain clinic fu at dc.  Ernest Haber, PA-C [place] [phone] 05/17/2013,9:56 AM  LOS: 2 days   I have seen and examined patient, discussed with PA and agree with assessment and plan as outlined above. Kelly Splinter MD pager [phone]    cell [phone] 05/17/2013, 12:49 PM

## 2013-05-17 NOTE — Progress Notes (Addendum)
Pt is threatening to walk out AMA because he cannot go to the vending machine downstairs without supervision. Pt has pulled off telemetry leads and is in room waiting for MD. CCMD notified. McClung MD notified, cardiac monitoring discontinued. Per MD order, instruct patient to wait for morning rounding team before making decision. Will continue to monitor at this time. Velora Mediate

## 2013-05-17 NOTE — Op Note (Signed)
[facility] [place], [id number]   OPERATIVE PROCEDURE REPORT  PATIENT: Jeremy Mccoy, Jeremy Mccoy  MR#: [id number] BIRTHDATE: 11-Aug-1965  GENDER: Male ENDOSCOPIST: Carol Ada, MD ASSISTANT:   Sharon Mt, Endo Technician and Ward Chatters, RN, BSN PROCEDURE DATE: 05/17/2013 PROCEDURE:   EGD ASA CLASS:   Class III INDICATIONS: Hematemesis and Anemia MEDICATIONS: Versed 5 mg IV, Fentanyl 75 mcg IV, and Benadryl 50 mg IV TOPICAL ANESTHETIC:   Cetacaine Spray  DESCRIPTION OF PROCEDURE:   After the risks benefits and alternatives of the procedure were thoroughly explained, informed consent was obtained.  The Pentax Gastroscope [id number]  endoscope was introduced through the mouth  and advanced to the second portion of the duodenum Without limitations.      The instrument was slowly withdrawn as the mucosa was fully examined.      FINDINGS: An LA Grade A esophagitis was identified in the setting of a small  hiatal hernia.  The gastric lumen was normal, i.e., no evidence of erosions, polyps, masses, ulcers, or vasular abnormalities.  In the duodenal bulb inflammation was identified. The scope was then withdrawn from the patient and the procedure terminated.  COMPLICATIONS: There were no complications. IMPRESSION: 1) LA Grade A esophagitis. 2) Small hiatal hernia. 3) Duodenitis.  RECOMMENDATIONS: 1) PPI QD. 2) Check H. pylori serology and treat if positive. 3) No further GI evaluation.  I will NOT follow up with the patient as an outpatient as we have a poor physician-patient relationship.  _______________________________ Lorrin MaisCarol Ada, MD 05/17/2013 12:52 PM

## 2013-05-17 NOTE — Progress Notes (Signed)
Pt signed d/c papers, IV d/c'd. Instructions given to follow up with PCP and pain management.

## 2013-05-17 NOTE — Discharge Summary (Signed)
Physician Discharge Summary  Jeremy Mccoy [id number] DOB: [birth year] DOA: 05/15/2013  PCP: Pcp Not In System  Admit date: 05/15/2013 Discharge date: 05/17/2013  Time spent: 35 minutes  Recommendations for Outpatient Follow-up:  Patient will be discharged to home. He is to follow his primary care physician within week of discharge. Patient should continue taking his medications as prescribed. He should also continue dialysis as scheduled. Patient should have his hemoglobin and hematocrit followed during dialysis.   Discharge Diagnoses:  Active Problems:   End stage renal disease, stable   Cocaine abuse, counseled   HTN (hypertension)   Acute blood loss anemia   Hematemesis   Discharge Condition: Stable  Diet recommendation: Heart/renal healthy  Filed Weights   05/16/13 2052  Weight: 75.3 kg (166 lb 0.1 oz)    History of present illness:  Jeremy Mccoy is a 48 y.o. male with PMH significant for ESRD (dialysis center in [place]), cocaine abuse, COPD who presents with the above complaints. He states that he went for dialysis today and was on the ''machine only for 3hours because that was all I could tolerate''and his hgb obtained 3days days ago came back at 6.7 and he looked pale and so was brought to the ED. He states that 3days ago he had an episode of hematemesis and felt weak and dizzy since then. He reports that at his last dialysis on Monday- He told the staff at the center and lab was done at that time and came back low today. He admits to [facility] powder use for pain because'my Drs will not prescribe me pain medications' . He denies any n/v since that episode. He also denies melena and no hematochezia. He was seen in the ED and Hgb was 6.2, and he was guaiac + on rectal exam per EDP. 2U prbcs were ordered and GI consulted. Dr Benson Norway came to do EGD but pt refused stating that he did not want him to touch him because of his 'attitude'. He states that the reason why he came to  the hospital was only to get a blood transfusion. He denies CP, SOB and no leg swelling. He is admitted to [facility] for further eval and management.  Hospital Course:  This is a 48 year old male with a history of end-stage renal disease requiring hemodialysis, cocaine abuse, COPD or presented to the emergency department with hematemesis. Patient was not have a hemoglobin of 6.2 the emergency department received 2 units packed red blood cells. He was also placed on Protonix. Patient was admitted for anemia secondary to an upper GI bleed with hematemesis. Dr. Benson Norway, gastroenterologist, was consulted for possible EGD. Patient did not want to have EGD however was later convinced that he should have one. Patient did bring to leave against medical Bice several times. Patient is a mild hospital floor several times. Therefore patient was told that if he left again, that he would be signing out [place]. Patient did undergo EGD with Dr. Benson Norway, hemoglobin did remain stable was around 7.4. Patient was found to have grade a esophagitis, small hiatal hernia, duodenitis. Recommendations are to continue PPI daily, H. pylori serology is still pending, and can be followed up as an outpatient. Patient also has end-stage renal disease requiring hemodialysis. Nephrology was consulted patient only does Monday Wednesday Friday, he will dialyze tomorrow Saturday due to the holiday schedule as an outpatient. Patient also had hypertension however his hypertension meds were held due to his current drug abuse. Patient does have  history of drug abuse with cocaine, patient was counseled and states he's been trying to quit for approximately 25 years however "keeps falling back into it" we were unable to obtain a urine drug screen as patient does not make urine. Patient was noted to have hypokalemia upon admission, this has resolved. Patient will need to have his hemoglobin and hematocrit continuously monitored as an outpatient. He was  weaned off his primary care physician follow his H. pylori serology is pending. This was discussed with the patient, he does understand agree. Patient will need to follow his primary care physician as well as continue dialysis. Patient should continue taking his medications as prescribed. And he should seek counseling for his drug abuse.  Procedures: EGD:  1) LA Grade A esophagitis.  2) Small hiatal hernia.  3) Duodenitis.   Consultations: Nephrology Gastroenterology  Discharge Exam: Filed Vitals:   05/17/13 1310  BP: 147/81  Pulse: 78  Temp:   Resp: 15   Exam  General: Well developed, well nourished, NAD, appears stated age  HEENT: NCAT, PERRLA, EOMI, Anicteic Sclera, mucous membranes moist.  Neck: Supple, no JVD, no masses  Cardiovascular: S1 S2 auscultated, regular rate and rhythm.  Respiratory: Clear to auscultation bilaterally with equal chest rise  Abdomen: Soft, nontender, nondistended, + bowel sounds  Extremities: warm dry without cyanosis clubbing or edema  Neuro: AAOx3, cranial nerves grossly intact. left upper extremity AV fistula with good thrill and bruit  Skin: Without rashes exudates or nodules  Psych: Normal affect and demeanor with intact judgement and insight   Discharge Instructions      Discharge Orders   Future Orders Complete By Expires   Diet - low sodium heart healthy  As directed    Discharge instructions  As directed    Comments:     Patient will be discharged to home. He is to follow his primary care physician within week of discharge. Patient should continue taking his medications as prescribed. He should also continue dialysis as scheduled. Patient should have his hemoglobin and hematocrit followed during dialysis.   Increase activity slowly  As directed        Medication List    STOP taking these medications       famotidine 20 MG tablet  Commonly known as:  PEPCID      TAKE these medications       acetaminophen 500 MG tablet    Commonly known as:  TYLENOL  Take 1,000 mg by mouth every 6 (six) hours as needed. pain     amLODipine 10 MG tablet  Commonly known as:  NORVASC  Take 10 mg by mouth daily.     calcium carbonate (dosed in mg elemental calcium) 1250 MG/5ML  Take 5 mLs (500 mg of elemental calcium total) by mouth every 6 (six) hours as needed for indigestion.     feeding supplement (NEPRO CARB STEADY) Liqd  Take 237 mLs by mouth 3 (three) times daily as needed (Supplement).     hydrOXYzine 25 MG tablet  Commonly known as:  ATARAX/VISTARIL  Take 25 mg by mouth 3 (three) times daily as needed. itching     losartan 100 MG tablet  Commonly known as:  COZAAR  Take 100 mg by mouth at bedtime.     multivitamin Tabs tablet  Take 1 tablet by mouth at bedtime.     pantoprazole 40 MG tablet  Commonly known as:  PROTONIX  Take 1 tablet (40 mg total) by mouth every morning.  sevelamer carbonate 800 MG tablet  Commonly known as:  RENVELA  Take 800 mg by mouth 3 (three) times daily with meals.       No Known Allergies Follow-up Information   Follow up with Primary Care physician. Schedule an appointment as soon as possible for a visit in 1 week.       The results of significant diagnostics from this hospitalization (including imaging, microbiology, ancillary and laboratory) are listed below for reference.    Significant Diagnostic Studies: No results found.  Microbiology: No results found for this or any previous visit (from the past 240 hour(s)).   Labs: Basic Metabolic Panel:  Recent Labs Lab 05/15/13 1225 05/15/13 1244 05/16/13 0720 05/17/13 0500  NA  --  137 137 139  K  --  3.3* 4.8 5.5*  CL  --  92* 96 95*  CO2  --   --  27 21  GLUCOSE  --  113* 82 80  BUN  --  30* 44* 64*  CREATININE  --  8.50* 12.50* 15.66*  CALCIUM  --   --  9.6 9.7  MG 1.9  --   --   --   PHOS 3.1  --   --   --    Liver Function Tests:  Recent Labs Lab 05/15/13 1242  AST 13  ALT 8  ALKPHOS 56   BILITOT <0.2*  PROT 7.0  ALBUMIN 3.7   No results found for this basename: LIPASE, AMYLASE,  in the last 168 hours No results found for this basename: AMMONIA,  in the last 168 hours CBC:  Recent Labs Lab 05/15/13 1225 05/15/13 1244 05/15/13 1915 05/16/13 0720 05/16/13 1214 05/17/13 0500  WBC 6.7  --   --   --   --  4.5  NEUTROABS 5.5  --   --   --   --   --   HGB 6.2* 6.5* 5.9* 7.4* 7.6* 7.4*  HCT 17.2* 19.0* 16.6* 20.8* 21.4* 20.8*  MCV 93.5  --   --   --   --  94.1  PLT 209  --   --   --   --  192   Cardiac Enzymes: No results found for this basename: CKTOTAL, CKMB, CKMBINDEX, TROPONINI,  in the last 168 hours BNP: BNP (last 3 results) No results found for this basename: PROBNP,  in the last 8760 hours CBG: No results found for this basename: GLUCAP,  in the last 168 hours     Signed:  Cristal Ford  [facility] 05/17/2013, 2:08 PM

## 2013-05-20 ENCOUNTER — Encounter (HOSPITAL_COMMUNITY): Payer: Self-pay | Admitting: Gastroenterology

## 2013-06-11 IMAGING — XA IR FLUORO GUIDE CV LINE*R*
1 series · 2 of 2 positions shown · non-contrast
Comparison: None

CLINICAL DATA: Acute renal failure, hemoptysis, vasculitis.

TUNNELED HEMODIALYSIS/PHERESIS CATHETER PLACEMENT WITH ULTRASOUND
AND FLUOROSCOPIC GUIDANCE:
TECHNIQUE: The procedure, risks, benefits, and alternatives were
explained to the patient.  Questions regarding the procedure were
encouraged and answered.  The patient understands and consents to
the procedure.

[Series 1: run · 2 of 2 slices shown]
[im 1/2]
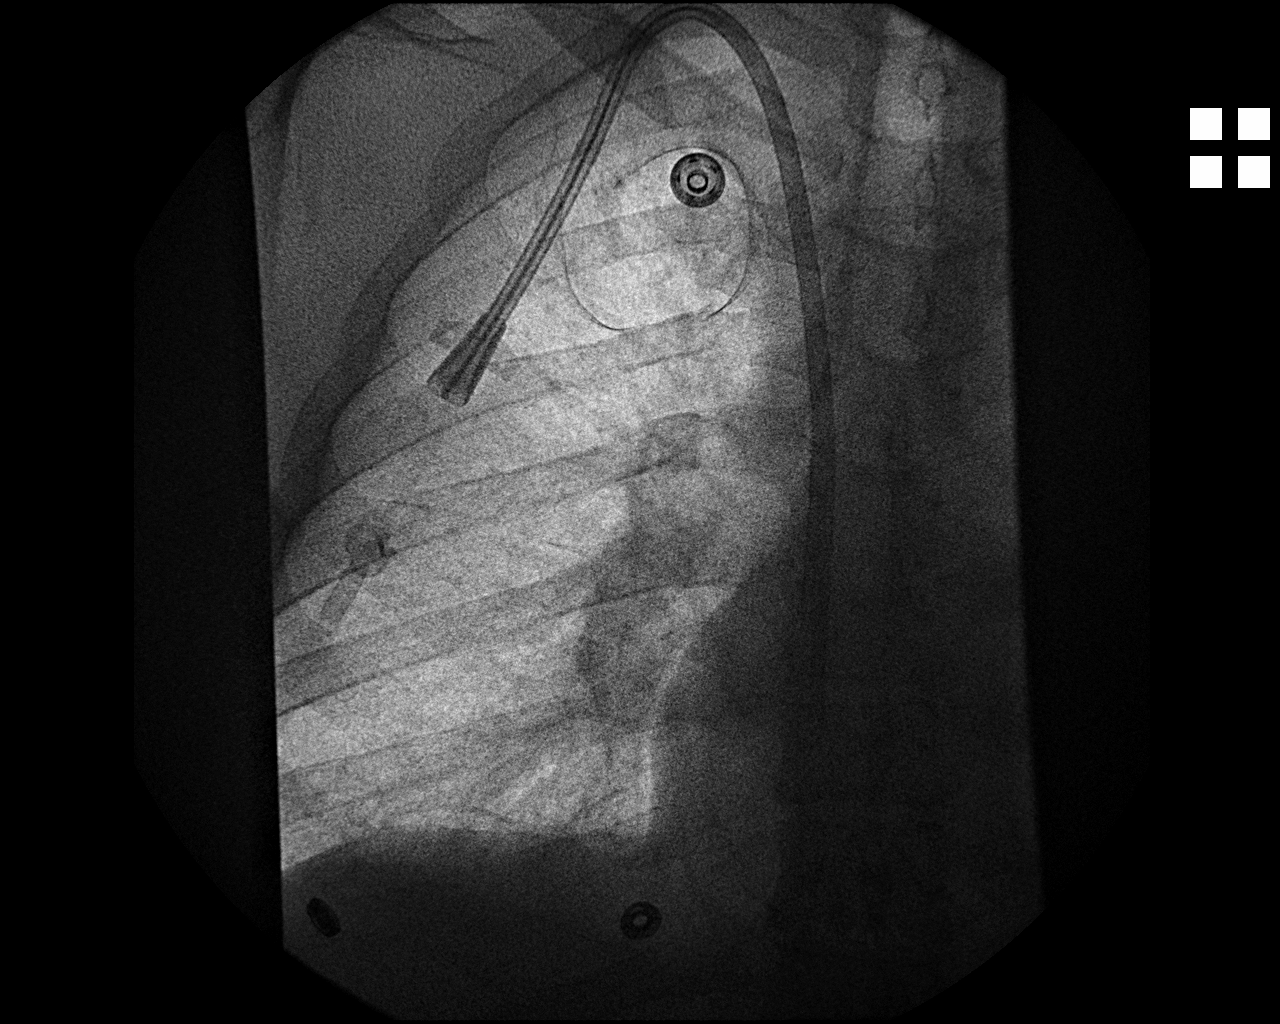
[im 2/2]
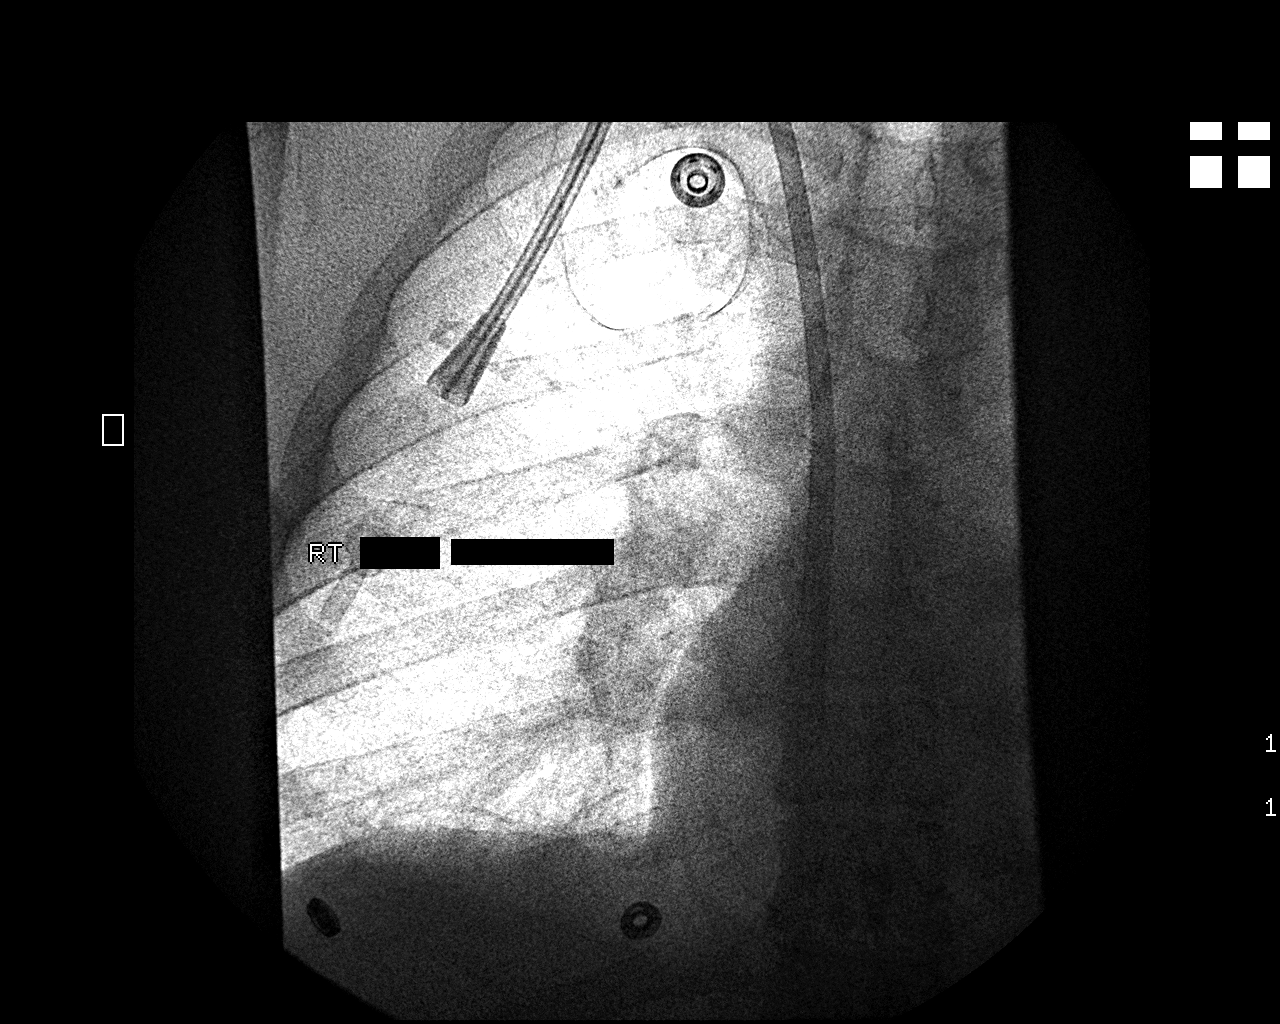

[2 of 2 positions shown; findings below may reference images not displayed]

As antibiotic prophylaxis, cefazolin 1 gram was ordered pre-
procedure and administered intravenously within one hour of
incision.

Patency of the right IJ vein was confirmed with ultrasound with
image documentation. An appropriate skin site was determined.
Region was prepped using maximum barrier technique including cap
and mask, sterile gown, sterile gloves, large sterile sheet, and
Chlorhexidine   as cutaneous antisepsis. The region was infiltrated
locally with 1% lidocaine.

Intravenous Fentanyl and Versed were administered as conscious
sedation during continuous cardiorespiratory monitoring by the
radiology RN, with a total moderate sedation time of 12 minutes.

Under real-time ultrasound guidance, the right IJ vein was accessed
with a 21 gauge micropuncture needle; the needle tip within the
vein was confirmed with ultrasound image documentation.   Needle
exchanged over the 018 guidewire for transitional dilator, which
allowed advancement of a Benson wire into the IVC. Over this, an
MPA catheter was advanced. A Hemosplit 23 hemodialysis catheter was
tunneled from the right anterior chest wall approach to the right
IJ dermatotomy site. The MPA catheter was exchanged over an Amplatz
wire for serial vascular dilators which allow placement of a peel-
away sheath, through which the catheter was advanced under
intermittent fluoroscopy, positioned with its tips in the proximal
and midright atrium. Spot chest radiograph confirms good catheter
position. No pneumothorax. Catheter was flushed and primed per
protocol. Catheter secured externally with O Prolene sutures. The
right IJ   dermatotomy site was closed with   Dermabond. No
immediate complication.
IMPRESSION: 1. Technically successful placement of tunneled right IJ
hemodialysis/pheresis catheter with ultrasound and fluoroscopic
guidance. Ready for routine use.

## 2013-06-11 IMAGING — CT CT CHEST W/O CM
3 of 4 series · 16 of 30 positions shown, 17 images · non-contrast
Comparison: Chest x-ray 11/04/2011

CLINICAL DATA: Vasculitis, rule out cavitary lesion

CT CHEST WITHOUT CONTRAST
TECHNIQUE: Multidetector CT imaging of the chest was performed
following the standard protocol without IV contrast.

[Series 2: routine chest · axial · 0.74mm/px · z∈[-267,-107]mm · 4 of 65 slices shown, 5 images]
[im 17/65  mediastinal]
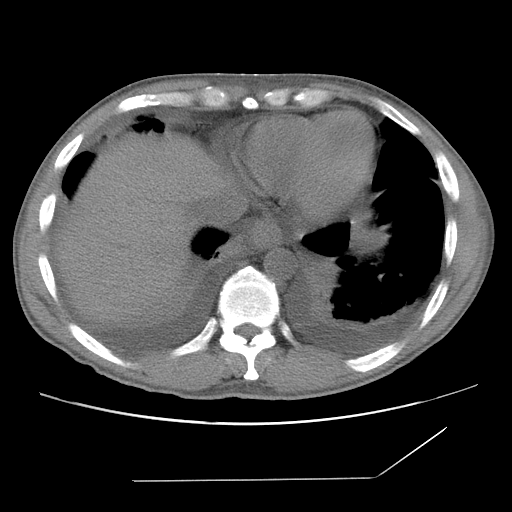
[im 17/65  lung]
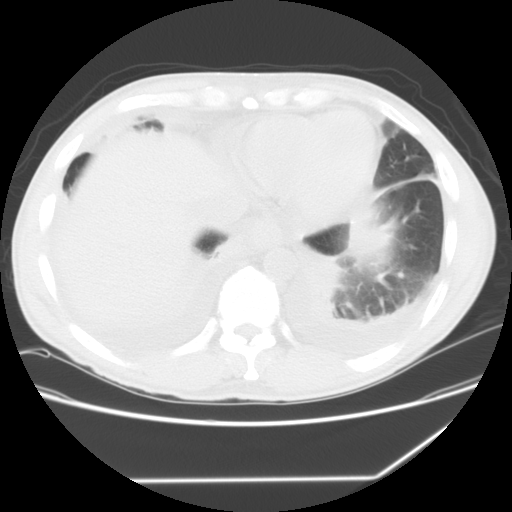
[im 33/65  lung]
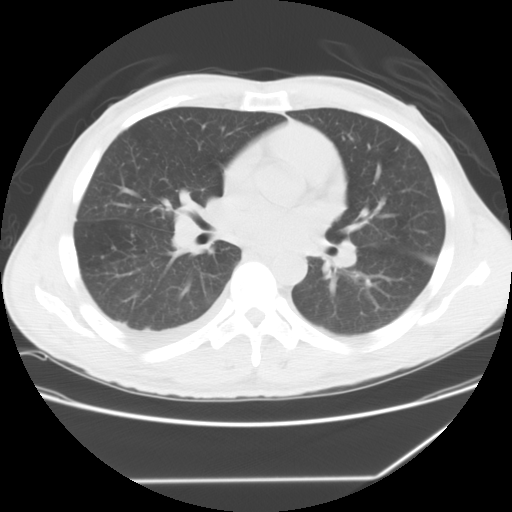
[im 34/65  lung]
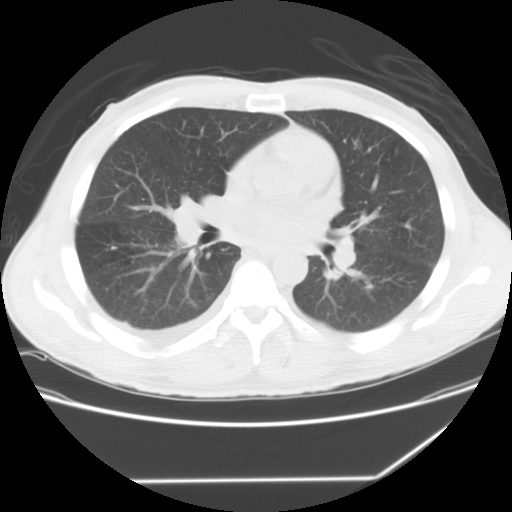
[im 49/65  lung]
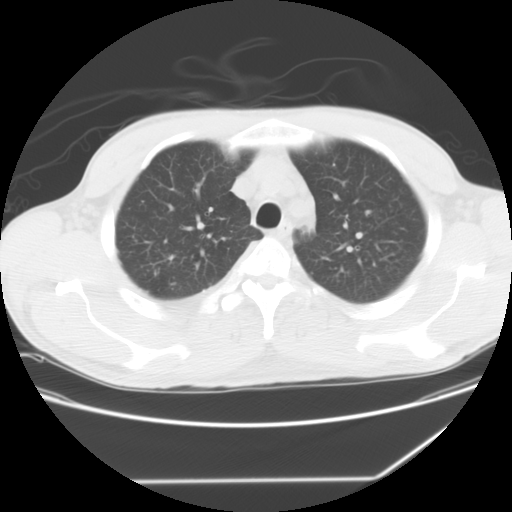

[Series 3: recon 2: routine chest · axial · 0.74mm/px · z∈[-272,-117]mm · 4 of 63 slices shown]
[im 16/63  lung]
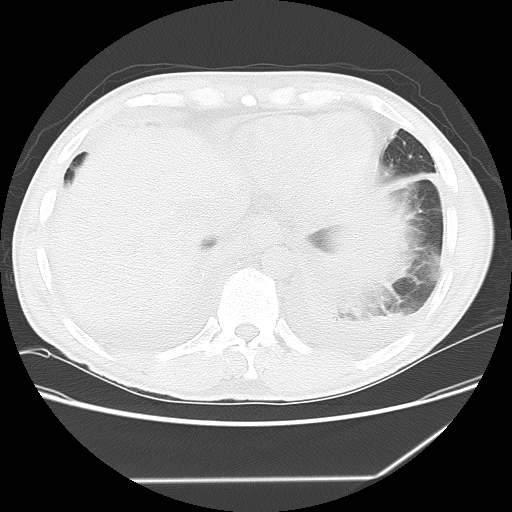
[im 32/63  lung]
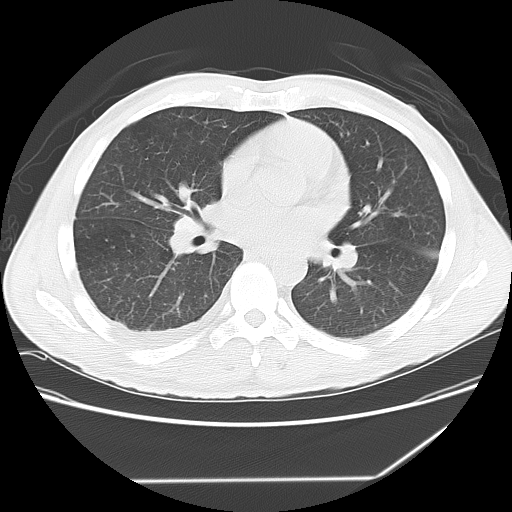
[im 34/63  lung]
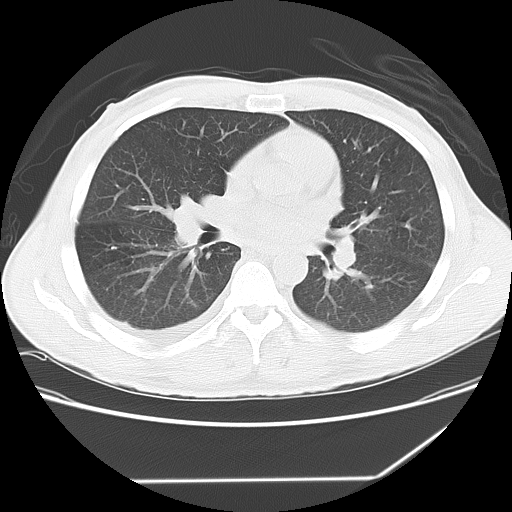
[im 47/63  lung]
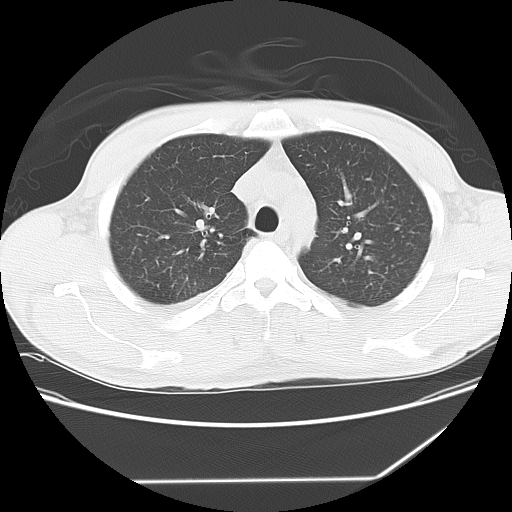

[Series 401: sagittals · sagittal · 0.74mm/px · 8 of 117 slices shown]
[im 13/117  lung]
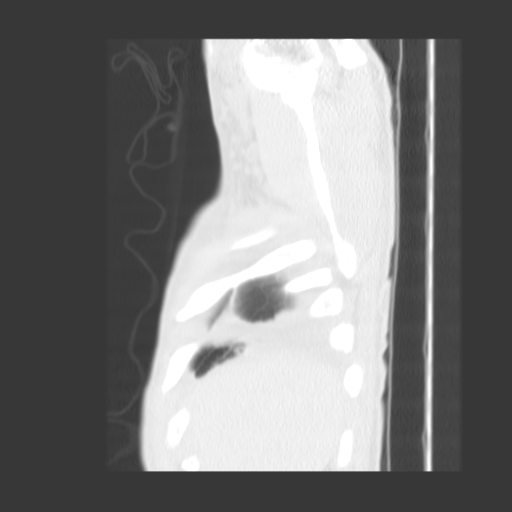
[im 26/117  lung]
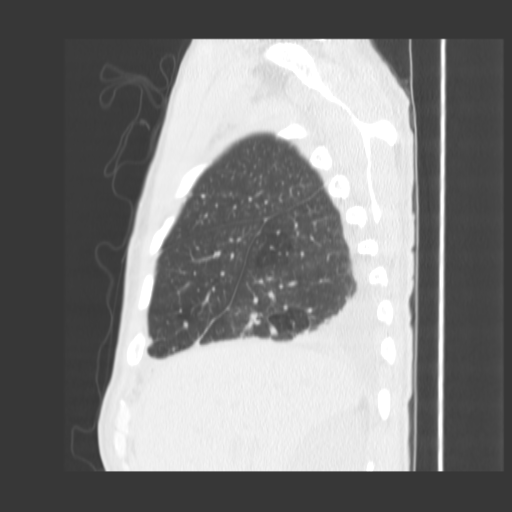
[im 39/117  lung]
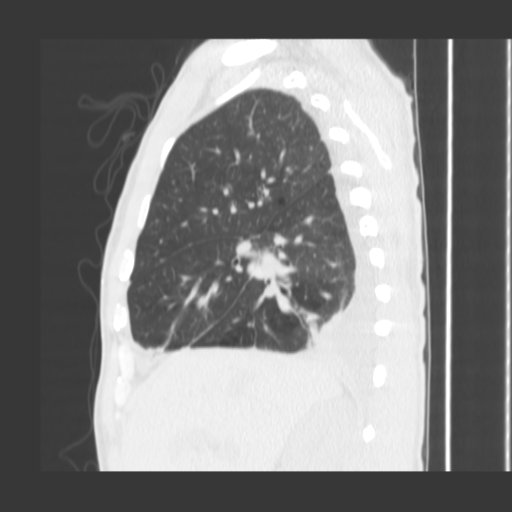
[im 52/117  lung]
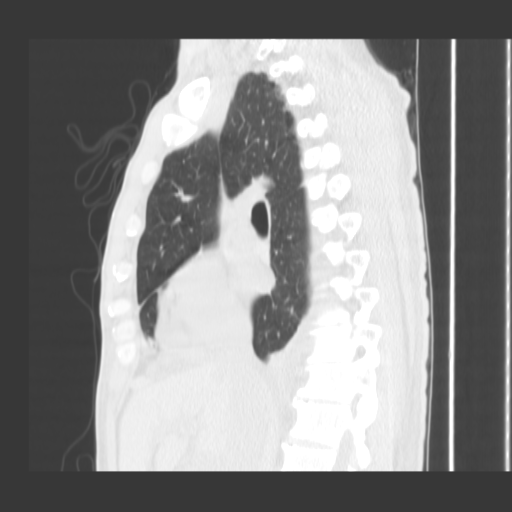
[im 65/117  lung]
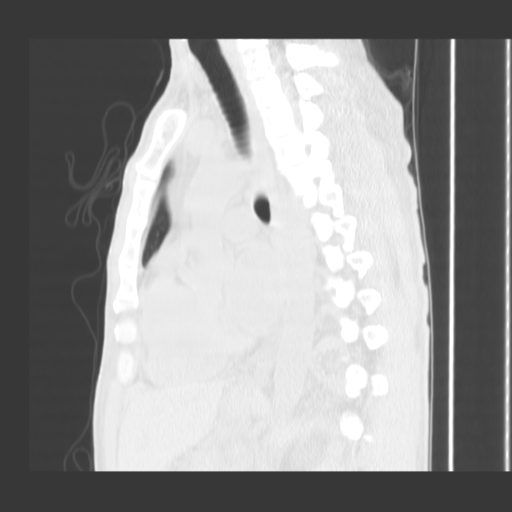
[im 78/117  lung]
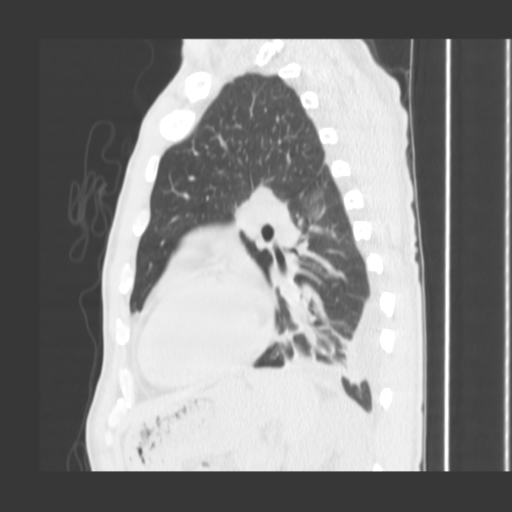
[im 91/117  lung]
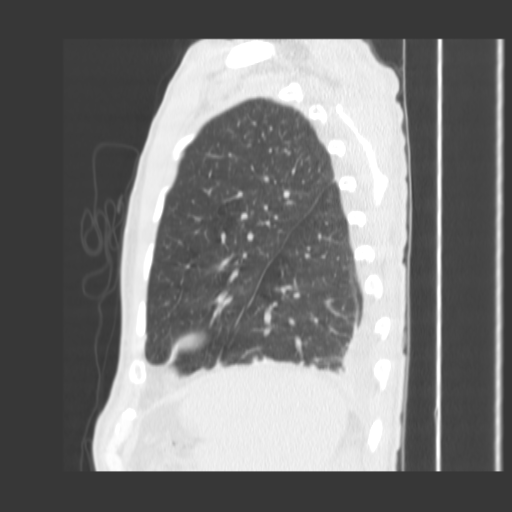
[im 104/117  lung]
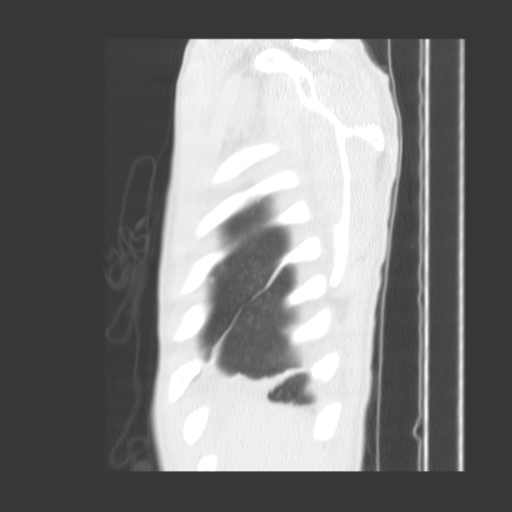

[16 of 30 positions shown; findings below may reference images not displayed]

FINDINGS: Sagittal images of the spine shows mild degenerative
changes lower thoracic spine.  No destructive bony lesions are
noted.

There is no mediastinal hematoma.  No significant adenopathy is
noted on this unenhanced scan.  The heart size is within normal
limits.  No pericardial effusion.

The visualized upper abdomen shows a tiny nonobstructive
calcification upper pole of the left kidney measures 2 mm.  No
adrenal gland mass is noted.  Visualized tail of the pancreas is
unremarkable.

Images of the lung parenchyma shows no acute infiltrate or
pulmonary edema.  Bilateral small pleural effusion is noted right
greater than left.  Bilateral lower lobe posterior atelectasis.

There is a pleural-based nodule in the left upper lobe posteriorly
measures 6 mm.  A pleural based nodule in the right upper lobe
posteriorly measures 3 mm.

A nodule in the right upper lobe anteriorly axial image 14 measures
3.7 mm.  Pleural based nodule in the right upper lobe anteriorly
measures 4 mm.  At least three small nodules are noted in the axial
image 23 in the right upper lobe anteriorly the largest measures 4
mm.  Small nodules are noted in the right lower lobe laterally
axial image 31 the largest measures 4 mm.

If the patient is a low risk for malignancy follow-up examination
in 12 months is recommended.  If the patient is high risk for
malignancy follow-up examination in 6 months is recommended.

No cavitary lesion is identified.  No pneumothorax.
IMPRESSION: 1.  Bilateral small pleural effusion right greater than left with
bilateral basilar posterior atelectasis.
2.  Bilateral small pulmonary nodules are noted.  The largest
nodule pleural-based in the left upper lobe posteriorly measures
about 6 mm.

If the patient is low risk for malignancy follow-up examination in
12 months is recommended to assure stability.  If the patient is
high risk for malignancy follow-up examination in 6 months is
recommended
3.  No acute infiltrate or pulmonary edema.
4.  No significant adenopathy.  No destructive bony lesions.

## 2013-07-10 ENCOUNTER — Other Ambulatory Visit: Payer: Self-pay

## 2013-07-10 ENCOUNTER — Emergency Department (HOSPITAL_COMMUNITY): Payer: Medicare Other

## 2013-07-10 ENCOUNTER — Inpatient Hospital Stay (HOSPITAL_COMMUNITY): Payer: Medicare Other

## 2013-07-10 ENCOUNTER — Inpatient Hospital Stay (HOSPITAL_COMMUNITY)
Admission: EM | Admit: 2013-07-10 | Discharge: 2013-07-19 | DRG: 100 | Disposition: A | Discharge status: Home / Self Care | Payer: Medicare Other | Attending: Internal Medicine | Admitting: Internal Medicine

## 2013-07-10 ENCOUNTER — Encounter (HOSPITAL_COMMUNITY): Payer: Self-pay | Admitting: Emergency Medicine

## 2013-07-10 DIAGNOSIS — F3289 Other specified depressive episodes: Secondary | ICD-10-CM | POA: Diagnosis present

## 2013-07-10 DIAGNOSIS — E875 Hyperkalemia: Secondary | ICD-10-CM | POA: Diagnosis present

## 2013-07-10 DIAGNOSIS — IMO0002 Reserved for concepts with insufficient information to code with codable children: Secondary | ICD-10-CM

## 2013-07-10 DIAGNOSIS — R569 Unspecified convulsions: Secondary | ICD-10-CM

## 2013-07-10 DIAGNOSIS — Z9115 Patient's noncompliance with renal dialysis: Secondary | ICD-10-CM

## 2013-07-10 DIAGNOSIS — G9349 Other encephalopathy: Secondary | ICD-10-CM | POA: Diagnosis present

## 2013-07-10 DIAGNOSIS — J811 Chronic pulmonary edema: Secondary | ICD-10-CM | POA: Diagnosis present

## 2013-07-10 DIAGNOSIS — K219 Gastro-esophageal reflux disease without esophagitis: Secondary | ICD-10-CM | POA: Diagnosis present

## 2013-07-10 DIAGNOSIS — Z91158 Patient's noncompliance with renal dialysis for other reason: Secondary | ICD-10-CM

## 2013-07-10 DIAGNOSIS — I120 Hypertensive chronic kidney disease with stage 5 chronic kidney disease or end stage renal disease: Secondary | ICD-10-CM | POA: Diagnosis present

## 2013-07-10 DIAGNOSIS — R1310 Dysphagia, unspecified: Secondary | ICD-10-CM | POA: Diagnosis not present

## 2013-07-10 DIAGNOSIS — D649 Anemia, unspecified: Secondary | ICD-10-CM | POA: Diagnosis present

## 2013-07-10 DIAGNOSIS — F411 Generalized anxiety disorder: Secondary | ICD-10-CM | POA: Diagnosis present

## 2013-07-10 DIAGNOSIS — E872 Acidosis, unspecified: Secondary | ICD-10-CM | POA: Diagnosis present

## 2013-07-10 DIAGNOSIS — J9690 Respiratory failure, unspecified, unspecified whether with hypoxia or hypercapnia: Secondary | ICD-10-CM | POA: Diagnosis present

## 2013-07-10 DIAGNOSIS — F17200 Nicotine dependence, unspecified, uncomplicated: Secondary | ICD-10-CM | POA: Diagnosis present

## 2013-07-10 DIAGNOSIS — F1410 Cocaine abuse, uncomplicated: Secondary | ICD-10-CM | POA: Diagnosis present

## 2013-07-10 DIAGNOSIS — N186 End stage renal disease: Secondary | ICD-10-CM

## 2013-07-10 DIAGNOSIS — N2581 Secondary hyperparathyroidism of renal origin: Secondary | ICD-10-CM | POA: Diagnosis present

## 2013-07-10 DIAGNOSIS — G40901 Epilepsy, unspecified, not intractable, with status epilepticus: Secondary | ICD-10-CM

## 2013-07-10 DIAGNOSIS — I959 Hypotension, unspecified: Secondary | ICD-10-CM | POA: Diagnosis not present

## 2013-07-10 DIAGNOSIS — J9600 Acute respiratory failure, unspecified whether with hypoxia or hypercapnia: Secondary | ICD-10-CM

## 2013-07-10 DIAGNOSIS — I10 Essential (primary) hypertension: Secondary | ICD-10-CM

## 2013-07-10 DIAGNOSIS — J150 Pneumonia due to Klebsiella pneumoniae: Secondary | ICD-10-CM | POA: Diagnosis not present

## 2013-07-10 DIAGNOSIS — E43 Unspecified severe protein-calorie malnutrition: Secondary | ICD-10-CM

## 2013-07-10 DIAGNOSIS — Z79899 Other long term (current) drug therapy: Secondary | ICD-10-CM

## 2013-07-10 DIAGNOSIS — G40401 Other generalized epilepsy and epileptic syndromes, not intractable, with status epilepticus: Principal | ICD-10-CM

## 2013-07-10 DIAGNOSIS — F329 Major depressive disorder, single episode, unspecified: Secondary | ICD-10-CM | POA: Diagnosis present

## 2013-07-10 HISTORY — DX: Depression, unspecified: F32.A

## 2013-07-10 HISTORY — DX: Major depressive disorder, single episode, unspecified: F32.9

## 2013-07-10 LAB — POCT I-STAT 3, ART BLOOD GAS (G3+)
ACID-BASE EXCESS: 9 mmol/L — AB (ref 0.0–2.0)
Bicarbonate: 29.4 mEq/L — ABNORMAL HIGH (ref 20.0–24.0)
O2 SAT: 97 %
Patient temperature: 38.2
TCO2: 30 mmol/L (ref 0–100)
pCO2 arterial: 30 mmHg — ABNORMAL LOW (ref 35.0–45.0)
pH, Arterial: 7.603 (ref 7.350–7.450)
pO2, Arterial: 83 mmHg (ref 80.0–100.0)

## 2013-07-10 LAB — CBC WITH DIFFERENTIAL/PLATELET
Basophils Absolute: 0 10*3/uL (ref 0.0–0.1)
Basophils Absolute: 0 10*3/uL (ref 0.0–0.1)
Basophils Relative: 0 % (ref 0–1)
Basophils Relative: 0 % (ref 0–1)
Eosinophils Absolute: 0 10*3/uL (ref 0.0–0.7)
Eosinophils Absolute: 0 10*3/uL (ref 0.0–0.7)
Eosinophils Relative: 0 % (ref 0–5)
Eosinophils Relative: 0 % (ref 0–5)
HCT: 36.1 % — ABNORMAL LOW (ref 39.0–52.0)
HEMATOCRIT: 39.9 % (ref 39.0–52.0)
Hemoglobin: 13.3 g/dL (ref 13.0–17.0)
Hemoglobin: 12.3 g/dL — ABNORMAL LOW (ref 13.0–17.0)
LYMPHS ABS: 0.8 10*3/uL (ref 0.7–4.0)
LYMPHS PCT: 4 % — AB (ref 12–46)
LYMPHS PCT: 6 % — AB (ref 12–46)
Lymphs Abs: 0.7 10*3/uL (ref 0.7–4.0)
MCH: 31.7 pg (ref 26.0–34.0)
MCH: 30.7 pg (ref 26.0–34.0)
MCHC: 33.3 g/dL (ref 30.0–36.0)
MCHC: 34.1 g/dL (ref 30.0–36.0)
MCV: 95 fL (ref 78.0–100.0)
MCV: 90 fL (ref 78.0–100.0)
Monocytes Absolute: 0.9 10*3/uL (ref 0.1–1.0)
Monocytes Absolute: 0.4 10*3/uL (ref 0.1–1.0)
Monocytes Relative: 5 % (ref 3–12)
Monocytes Relative: 3 % (ref 3–12)
NEUTROS ABS: 15.8 10*3/uL — AB (ref 1.7–7.7)
NEUTROS ABS: 12.1 10*3/uL — AB (ref 1.7–7.7)
NEUTROS PCT: 91 % — AB (ref 43–77)
Neutrophils Relative %: 91 % — ABNORMAL HIGH (ref 43–77)
PLATELETS: 131 10*3/uL — AB (ref 150–400)
Platelets: 179 10*3/uL (ref 150–400)
RBC: 4.2 MIL/uL — ABNORMAL LOW (ref 4.22–5.81)
RBC: 4.01 MIL/uL — AB (ref 4.22–5.81)
RDW: 14.8 % (ref 11.5–15.5)
RDW: 14.7 % (ref 11.5–15.5)
WBC: 17.4 10*3/uL — ABNORMAL HIGH (ref 4.0–10.5)
WBC: 13.3 10*3/uL — AB (ref 4.0–10.5)

## 2013-07-10 LAB — I-STAT ARTERIAL BLOOD GAS, ED
Acid-base deficit: 8 mmol/L — ABNORMAL HIGH (ref 0.0–2.0)
Acid-base deficit: 8 mmol/L — ABNORMAL HIGH (ref 0.0–2.0)
BICARBONATE: 19.9 meq/L — AB (ref 20.0–24.0)
Bicarbonate: 18.5 mEq/L — ABNORMAL LOW (ref 20.0–24.0)
O2 Saturation: 100 %
O2 Saturation: 97 %
PCO2 ART: 47.4 mmHg — AB (ref 35.0–45.0)
PH ART: 7.23 — AB (ref 7.350–7.450)
PH ART: 7.291 — AB (ref 7.350–7.450)
PO2 ART: 103 mmHg — AB (ref 80.0–100.0)
Patient temperature: 97.7
TCO2: 21 mmol/L (ref 0–100)
TCO2: 20 mmol/L (ref 0–100)
pCO2 arterial: 38.2 mmHg (ref 35.0–45.0)
pO2, Arterial: 507 mmHg — ABNORMAL HIGH (ref 80.0–100.0)

## 2013-07-10 LAB — I-STAT CG4 LACTIC ACID, ED: Lactic Acid, Venous: >17 mmol/L — ABNORMAL HIGH (ref 0.5–2.2)

## 2013-07-10 LAB — COMPREHENSIVE METABOLIC PANEL
ALK PHOS: 57 U/L (ref 39–117)
ALT: 15 U/L (ref 0–53)
ALT: 15 U/L (ref 0–53)
AST: 23 U/L (ref 0–37)
AST: 24 U/L (ref 0–37)
Albumin: 4.9 g/dL (ref 3.5–5.2)
Albumin: 4.5 g/dL (ref 3.5–5.2)
Alkaline Phosphatase: 67 U/L (ref 39–117)
BILIRUBIN TOTAL: 0.3 mg/dL (ref 0.3–1.2)
BUN: 100 mg/dL — ABNORMAL HIGH (ref 6–23)
BUN: 109 mg/dL — ABNORMAL HIGH (ref 6–23)
CALCIUM: 10.9 mg/dL — AB (ref 8.4–10.5)
CHLORIDE: 80 meq/L — AB (ref 96–112)
CHLORIDE: 83 meq/L — AB (ref 96–112)
CO2: 8 mEq/L — CL (ref 19–32)
CO2: 18 meq/L — AB (ref 19–32)
CREATININE: 21.54 mg/dL — AB (ref 0.50–1.35)
Calcium: 9.9 mg/dL (ref 8.4–10.5)
Creatinine, Ser: 22.78 mg/dL — ABNORMAL HIGH (ref 0.50–1.35)
GFR calc Af Amer: 2 mL/min — ABNORMAL LOW (ref >90)
GFR calc non Af Amer: 2 mL/min — ABNORMAL LOW (ref >90)
GFR, EST AFRICAN AMERICAN: 2 mL/min — AB (ref >90)
GFR, EST NON AFRICAN AMERICAN: 2 mL/min — AB (ref >90)
Glucose, Bld: 168 mg/dL — ABNORMAL HIGH (ref 70–99)
Glucose, Bld: 142 mg/dL — ABNORMAL HIGH (ref 70–99)
Potassium: 5.4 mEq/L — ABNORMAL HIGH (ref 3.7–5.3)
Potassium: 7.6 mEq/L (ref 3.7–5.3)
SODIUM: 134 meq/L — AB (ref 137–147)
Sodium: 140 mEq/L (ref 137–147)
TOTAL PROTEIN: 7.7 g/dL (ref 6.0–8.3)
Total Bilirubin: 0.3 mg/dL (ref 0.3–1.2)
Total Protein: 8.8 g/dL — ABNORMAL HIGH (ref 6.0–8.3)

## 2013-07-10 LAB — PROTIME-INR
INR: 1.26 (ref 0.00–1.49)
INR: 1.12 (ref 0.00–1.49)
PROTHROMBIN TIME: 15.5 s — AB (ref 11.6–15.2)
Prothrombin Time: 14.2 seconds (ref 11.6–15.2)

## 2013-07-10 LAB — MRSA PCR SCREENING: MRSA BY PCR: NEGATIVE

## 2013-07-10 LAB — LACTIC ACID, PLASMA
LACTIC ACID, VENOUS: 3.1 mmol/L — AB (ref 0.5–2.2)
Lactic Acid, Venous: 22.5 mmol/L — ABNORMAL HIGH (ref 0.5–2.2)

## 2013-07-10 LAB — GLUCOSE, CAPILLARY: Glucose-Capillary: 75 mg/dL (ref 70–99)

## 2013-07-10 LAB — PRO B NATRIURETIC PEPTIDE: Pro B Natriuretic peptide (BNP): 29037 pg/mL — ABNORMAL HIGH (ref 0–125)

## 2013-07-10 LAB — I-STAT TROPONIN, ED: TROPONIN I, POC: 0.05 ng/mL (ref 0.00–0.08)

## 2013-07-10 LAB — PROCALCITONIN: Procalcitonin: 0.63 ng/mL

## 2013-07-10 LAB — PHENYTOIN LEVEL, TOTAL: Phenytoin Lvl: 9 ug/mL — ABNORMAL LOW (ref 10.0–20.0)

## 2013-07-10 LAB — APTT: aPTT: 29 seconds (ref 24–37)

## 2013-07-10 MED ORDER — HEPARIN SODIUM (PORCINE) 1000 UNIT/ML DIALYSIS
1000.0000 [IU] | INTRAMUSCULAR | Status: DC | PRN
  Filled 2013-07-10: qty 1

## 2013-07-10 MED ORDER — PHENYTOIN SODIUM 50 MG/ML IJ SOLN
100.0000 mg | Freq: Three times a day (TID) | INTRAMUSCULAR | Status: DC
  Administered 2013-07-11 – 2013-07-16 (×16): 100 mg via INTRAVENOUS
  Filled 2013-07-10 (×20): qty 2

## 2013-07-10 MED ORDER — LIDOCAINE-PRILOCAINE 2.5-2.5 % EX CREA
1.0000 "application " | TOPICAL_CREAM | CUTANEOUS | Status: DC | PRN
  Filled 2013-07-10: qty 5

## 2013-07-10 MED ORDER — DEXTROSE 50 % IV SOLN
1.0000 | Freq: Once | INTRAVENOUS | Status: AC
  Administered 2013-07-10: 50 mL via INTRAVENOUS
  Filled 2013-07-10: qty 50

## 2013-07-10 MED ORDER — PROPOFOL 10 MG/ML IV EMUL: 5.0000 ug/kg/min | INTRAVENOUS | Status: DC

## 2013-07-10 MED ORDER — DEXTROSE 5 % IV SOLN
2.0000 ug/min | INTRAVENOUS | Status: DC
  Administered 2013-07-10: 2 ug/min via INTRAVENOUS
  Administered 2013-07-11: 4 ug/min via INTRAVENOUS
  Filled 2013-07-10 (×3): qty 4

## 2013-07-10 MED ORDER — SODIUM CHLORIDE 0.9 % IV SOLN
1000.0000 mg | Freq: Once | INTRAVENOUS | Status: AC
  Administered 2013-07-10: 1000 mg via INTRAVENOUS
  Filled 2013-07-10: qty 10

## 2013-07-10 MED ORDER — LORAZEPAM 2 MG/ML IJ SOLN
2.0000 mg | Freq: Once | INTRAMUSCULAR | Status: AC
  Administered 2013-07-10: 2 mg via INTRAVENOUS

## 2013-07-10 MED ORDER — AMPICILLIN-SULBACTAM SODIUM 3 (2-1) G IJ SOLR
3.0000 g | INTRAMUSCULAR | Status: DC
  Administered 2013-07-10 – 2013-07-12 (×3): 3 g via INTRAVENOUS
  Filled 2013-07-10 (×4): qty 3

## 2013-07-10 MED ORDER — PENTOBARBITAL SODIUM 50 MG/ML IJ SOLN
3.0000 mg/kg/h | INTRAVENOUS | Status: DC
  Administered 2013-07-10 – 2013-07-11 (×3): 3 mg/kg/h via INTRAVENOUS
  Filled 2013-07-10 (×3): qty 50

## 2013-07-10 MED ORDER — ACETAMINOPHEN 160 MG/5ML PO SOLN
325.0000 mg | Freq: Four times a day (QID) | ORAL | Status: DC | PRN
  Administered 2013-07-10: 325 mg via ORAL
  Filled 2013-07-10 (×2): qty 20.3

## 2013-07-10 MED ORDER — LORAZEPAM 2 MG/ML IJ SOLN: 2.0000 mg | Freq: Once | INTRAMUSCULAR | Status: DC

## 2013-07-10 MED ORDER — CALCIUM CHLORIDE 10 % IV SOLN
1.0000 g | Freq: Once | INTRAVENOUS | Status: AC
  Administered 2013-07-10: 1 g via INTRAVENOUS
  Filled 2013-07-10: qty 10

## 2013-07-10 MED ORDER — ALTEPLASE 2 MG IJ SOLR
2.0000 mg | Freq: Once | INTRAMUSCULAR | Status: AC | PRN
  Filled 2013-07-10: qty 2

## 2013-07-10 MED ORDER — PANTOPRAZOLE SODIUM 40 MG IV SOLR
40.0000 mg | INTRAVENOUS | Status: DC
  Administered 2013-07-10 – 2013-07-13 (×4): 40 mg via INTRAVENOUS
  Filled 2013-07-10 (×7): qty 40

## 2013-07-10 MED ORDER — SODIUM CHLORIDE 0.9 % IV SOLN: 100.0000 mL | INTRAVENOUS | Status: DC | PRN

## 2013-07-10 MED ORDER — SODIUM CHLORIDE 0.9 % IV SOLN
25.0000 ug/h | INTRAVENOUS | Status: DC
  Administered 2013-07-10: 50 ug/h via INTRAVENOUS
  Administered 2013-07-10: 100 ug/h via INTRAVENOUS
  Administered 2013-07-11: 50 ug/h via INTRAVENOUS
  Filled 2013-07-10 (×2): qty 50

## 2013-07-10 MED ORDER — SODIUM CHLORIDE 0.9 % IV SOLN
500.0000 mg | Freq: Two times a day (BID) | INTRAVENOUS | Status: DC
  Administered 2013-07-10 – 2013-07-16 (×12): 500 mg via INTRAVENOUS
  Filled 2013-07-10 (×16): qty 5

## 2013-07-10 MED ORDER — ACETAMINOPHEN 160 MG/5ML PO SOLN
650.0000 mg | Freq: Four times a day (QID) | ORAL | Status: DC | PRN
  Administered 2013-07-10 – 2013-07-11 (×2): 650 mg via ORAL
  Filled 2013-07-10 (×2): qty 20.3

## 2013-07-10 MED ORDER — MIDAZOLAM HCL 2 MG/2ML IJ SOLN
2.0000 mg | Freq: Once | INTRAMUSCULAR | Status: AC
  Administered 2013-07-10: 2 mg via INTRAVENOUS

## 2013-07-10 MED ORDER — LORAZEPAM 2 MG/ML IJ SOLN
INTRAMUSCULAR | Status: AC
  Administered 2013-07-10: 2 mg
  Filled 2013-07-10: qty 1

## 2013-07-10 MED ORDER — HEPARIN SODIUM (PORCINE) 5000 UNIT/ML IJ SOLN
5000.0000 [IU] | Freq: Three times a day (TID) | INTRAMUSCULAR | Status: DC
  Administered 2013-07-10 – 2013-07-12 (×6): 5000 [IU] via SUBCUTANEOUS
  Filled 2013-07-10 (×9): qty 1

## 2013-07-10 MED ORDER — NITROGLYCERIN IN D5W 200-5 MCG/ML-% IV SOLN
INTRAVENOUS | Status: AC
  Administered 2013-07-10: 50000 ug
  Filled 2013-07-10: qty 250

## 2013-07-10 MED ORDER — NICARDIPINE HCL IN NACL 20-0.86 MG/200ML-% IV SOLN
3.0000 mg/h | INTRAVENOUS | Status: DC
  Administered 2013-07-12: 3 mg/h via INTRAVENOUS
  Filled 2013-07-10: qty 200

## 2013-07-10 MED ORDER — INSULIN ASPART 100 UNIT/ML IV SOLN
10.0000 [IU] | Freq: Once | INTRAVENOUS | Status: AC
  Administered 2013-07-10: 10 [IU] via INTRAVENOUS
  Filled 2013-07-10: qty 0.1

## 2013-07-10 MED ORDER — BIOTENE DRY MOUTH MT LIQD
15.0000 mL | Freq: Four times a day (QID) | OROMUCOSAL | Status: DC
  Administered 2013-07-10 – 2013-07-15 (×18): 15 mL via OROMUCOSAL

## 2013-07-10 MED ORDER — SODIUM CHLORIDE 0.9 % IV SOLN
1000.0000 mg | Freq: Once | INTRAVENOUS | Status: AC
  Administered 2013-07-10: 1000 mg via INTRAVENOUS
  Filled 2013-07-10: qty 20

## 2013-07-10 MED ORDER — MIDAZOLAM HCL 2 MG/2ML IJ SOLN
INTRAMUSCULAR | Status: AC
  Filled 2013-07-10: qty 2

## 2013-07-10 MED ORDER — LIDOCAINE HCL (PF) 1 % IJ SOLN: 5.0000 mL | INTRAMUSCULAR | Status: DC | PRN

## 2013-07-10 MED ORDER — THIAMINE HCL 100 MG/ML IJ SOLN
Freq: Once | INTRAVENOUS | Status: AC
  Administered 2013-07-10: 11:00:00 via INTRAVENOUS
  Filled 2013-07-10: qty 1000

## 2013-07-10 MED ORDER — PROPOFOL 10 MG/ML IV EMUL
INTRAVENOUS | Status: AC
  Administered 2013-07-10: 1000 mg
  Filled 2013-07-10: qty 100

## 2013-07-10 MED ORDER — CHLORHEXIDINE GLUCONATE 0.12 % MT SOLN
15.0000 mL | Freq: Two times a day (BID) | OROMUCOSAL | Status: DC
  Administered 2013-07-10 – 2013-07-14 (×9): 15 mL via OROMUCOSAL
  Filled 2013-07-10 (×8): qty 15

## 2013-07-10 MED ORDER — PENTAFLUOROPROP-TETRAFLUOROETH EX AERO: 1.0000 "application " | INHALATION_SPRAY | CUTANEOUS | Status: DC | PRN

## 2013-07-10 MED ORDER — ROCURONIUM BROMIDE 50 MG/5ML IV SOLN
1.0000 mg/kg | Freq: Once | INTRAVENOUS | Status: AC
  Administered 2013-07-10: 100 mg via INTRAVENOUS

## 2013-07-10 MED ORDER — NEPRO/CARBSTEADY PO LIQD
1000.0000 mL | ORAL | Status: DC
  Administered 2013-07-10: 1000 mL
  Filled 2013-07-10 (×4): qty 1000

## 2013-07-10 MED ORDER — PENTOBARBITAL LOAD VIA INFUSION
10.0000 mg/kg | Freq: Once | INTRAVENOUS | Status: AC
  Administered 2013-07-10: 750 mg via INTRAVENOUS

## 2013-07-10 MED ORDER — NICARDIPINE HCL IN NACL 20-0.86 MG/200ML-% IV SOLN
5.0000 mg/h | Freq: Once | INTRAVENOUS | Status: AC
  Administered 2013-07-10: 5 mg/h via INTRAVENOUS

## 2013-07-10 MED ORDER — NEPRO/CARBSTEADY PO LIQD
237.0000 mL | ORAL | Status: DC | PRN
  Filled 2013-07-10: qty 237

## 2013-07-10 NOTE — Progress Notes (Signed)
CRITICAL VALUE ALERT   Critical value received: potassium 7.6   Date of notification:  07/10/13   Time of notification:  11:16 AM   Critical value read back:yes  Nurse who received alert:  Trudie Buckler   MD notified (1st page):  yacoub   Time of first page:  11:17 AM   MD notified (2nd page):  Time of second page:  Responding MD: yacoub Time MD responded:11:29 AM

## 2013-07-10 NOTE — ED Notes (Signed)
Lactic acid results given to Dr. Zenia Resides

## 2013-07-10 NOTE — ED Notes (Signed)
Critical care at bedside placing central line

## 2013-07-10 NOTE — Progress Notes (Signed)
LTVM EEG initiated.

## 2013-07-10 NOTE — H&P (Signed)
Name: Leola Deragon MRN: [id number] DOB: Sep 20, 1965    ADMISSION DATE:  07/10/2013 CONSULTATION DATE:  07/10/2013  REFERRING MD :  Dr. Zenia Resides, ED. PRIMARY SERVICE: PCCM  CHIEF COMPLAINT:  Seizure and AMS  BRIEF PATIENT DESCRIPTION: 48 year old male with history of ESRD and cocaine abuse who was found down by family on 2/25 seizing.  EMS was called and patient was brought to the ED.  Was noted that he was not protecting his airway and was intubated.  PCCM was called to admit.  Patient is actively seizing post intubation and after paralytics were off.  SIGNIFICANT EVENTS / STUDIES:  2/25 seizure intubated  LINES / TUBES: PIV ET tube 2/25>>>  CULTURES: None  ANTIBIOTICS: None  PAST MEDICAL HISTORY :  ESRD and drug abuse, otherwise unkown. History reviewed. No pertinent past medical history. History reviewed. No pertinent past surgical history. Prior to Admission medications   Not on File   Not on File  FAMILY HISTORY:  No family history on file. SOCIAL HISTORY:  has no tobacco, alcohol, and drug history on file.  REVIEW OF SYSTEMS:  Unattainable.  SUBJECTIVE: Sedated and intubated.  VITAL SIGNS: Pulse Rate:  [138-154] 147 (02/25 0800) Resp:  [16-33] 29 (02/25 0800) BP: (171-235)/(107-147) 171/107 mmHg (02/25 0800) SpO2:  [96 %-100 %] 100 % (02/25 0800) FiO2 (%):  [100 %] 100 % (02/25 0655) HEMODYNAMICS:   VENTILATOR SETTINGS: Vent Mode:  [-] PRVC FiO2 (%):  [100 %] 100 % Set Rate:  [16 bmp] 16 bmp Vt Set:  [520 mL] 520 mL PEEP:  [5 cmH20] 5 cmH20 Plateau Pressure:  [13 cmH20] 13 cmH20 INTAKE / OUTPUT: Intake/Output   None     PHYSICAL EXAMINATION: General:  Chronically ill appearing male, actively seizing. Neuro:  Actively seizing, moving all ext with seizure activity, no withdraw to pain. HEENT:  [place]/AT, PERRL, EOM-I and bloody mucous membranes. Cardiovascular:  Regular and tachy, Nl S1/S2, -M/R/G. Lungs:  CTA bilaterally. Abdomen:  Soft, NT, ND  and +BS. Musculoskeletal:  Active seizure. Skin:  Intact.  LABS:  CBC  Recent Labs Lab 07/10/13 0659  WBC 17.4*  HGB 13.3  HCT 39.9  PLT 179   Coag's  Recent Labs Lab 07/10/13 0659  INR 1.26   BMET No results found for this basename: NA, K, CL, CO2, BUN, CREATININE, GLUCOSE,  in the last 168 hours Electrolytes No results found for this basename: CALCIUM, MG, PHOS,  in the last 168 hours Sepsis Markers  Recent Labs Lab 07/10/13 0721  LATICACIDVEN >17.00*   ABG No results found for this basename: PHART, PCO2ART, PO2ART,  in the last 168 hours Liver Enzymes No results found for this basename: AST, ALT, ALKPHOS, BILITOT, ALBUMIN,  in the last 168 hours Cardiac Enzymes  Recent Labs Lab 07/10/13 0659  PROBNP 29037.0*   Glucose No results found for this basename: GLUCAP,  in the last 168 hours  Imaging Ct Head Wo Contrast  07/10/2013   CLINICAL DATA:  Severe headache.  Unresponsive.  EXAM: CT HEAD WITHOUT CONTRAST  TECHNIQUE: Contiguous axial images were obtained from the base of the skull through the vertex without intravenous contrast.  COMPARISON:  No comparison studies available.  FINDINGS: There is no evidence for acute hemorrhage, hydrocephalus, mass lesion, or abnormal extra-axial fluid collection. No definite CT evidence for acute infarction. Polypoid mucosal disease is seen in the right maxillary sinus. Remaining visualized paranasal sinuses are clear. No evidence for mastoid air cell fluid. A nasal tube  is evident.  IMPRESSION: No acute intracranial abnormality.   Electronically Signed   By: Misty Stanley M.D.   On: 07/10/2013 07:31   Dg Chest Port 1 View  07/10/2013   CLINICAL DATA:  Unresponsive patient.  Endotracheal tube placement.  EXAM: PORTABLE CHEST - 1 VIEW  COMPARISON:  None.  FINDINGS: Endotracheal tube tip is just below the thoracic inlet, 7.1 cm above the carina. Nasogastric tube extends as far as the gastroesophageal junction and quite likely into  the stomach.  Tiny punctate densities scattered in the lungs are observed, suggesting small calcified nodules.  Heart size within normal limits for technique. The lungs appear otherwise clear.  IMPRESSION: 1. Multiple tiny dense nodules in the lungs probably represent calcified tiny nodules, in the 3 mm range. Most likely causes are remote varicella infection or old granulomatous disease, although there are post of different possibilities for this type of appearance, including hyperparathyroidism, metastatic disease from osteosarcoma, alveolar microlithiasis, and pulmonary hemosiderosis, among others.   Electronically Signed   By: Sherryl Barters M.D.   On: 07/10/2013 07:17     CXR: no acute disease, ET tube high.  ASSESSMENT / PLAN:  PULMONARY A: Intubated for inability to protect airway.  Severe metabolic acidosis. P:   - Maintain on full vent support. - Increase RR to 35 for mixed acidosis.  CARDIOVASCULAR A: Severe HTN, SBP of 240 on presentation. P:  - Cardene for BP control for now. - Target SBP of 150-160. - Likely will need pressors once phenobarb coma starts.  RENAL A:  ESRD, metabolic acidosis and hyperkalemia. P:   - Renal consult. - Likely HD today. - If no HD then will consider a bicarb drip.  GASTROINTESTINAL A:  GERD by history. P:   - Protonix. - Nutrition for TF.  HEMATOLOGIC A:  No active issues. P:  - Monitor for now.  INFECTIOUS A:  No active infection. P:   - Monitor fever curve and WBC. - Hold off abx for now.  ENDOCRINE A:  No diabetes by history.   P:   - Monitor CBG.  NEUROLOGIC A:  Seizure, unknown if new. P:   - Phenobarb coma. - Propofol for now. - Keppra and dilantin. - Neuro consult.  TODAY'S SUMMARY: Seizure, unable to protect airway.  Intubated.  Neuro and renal consults.  Admit to the NICU.  Further recommendations per neuro.  I have personally obtained a history, examined the patient, evaluated laboratory and imaging  results, formulated the assessment and plan and placed orders.  CRITICAL CARE: The patient is critically ill with multiple organ systems failure and requires high complexity decision making for assessment and support, frequent evaluation and titration of therapies, application of advanced monitoring technologies and extensive interpretation of multiple databases. Critical Care Time devoted to patient care services described in this note is 45 minutes.   Rush Farmer, M.D. [facility] Pulmonary/Critical Care Medicine. Pager: [phone]. After hours pager: [phone].

## 2013-07-10 NOTE — ED Notes (Signed)
Critical care at bedside  

## 2013-07-10 NOTE — Progress Notes (Signed)
[place] Progress Note Patient Name: Kern Aho DOB: 1966-02-25 MRN: [id number]  Date of Service  07/10/2013   HPI/Events of Note   fever  eICU Interventions  APAP order adjusted to 650mg  po q6h   Intervention Category Major Interventions: Infection - evaluation and management  Adriana Lina 07/10/2013, 7:42 PM

## 2013-07-10 NOTE — Consult Note (Signed)
ANTIBIOTIC CONSULT NOTE - INITIAL  Pharmacy Consult for Unasyn Indication: aspiration pneumonia  Not on File  Patient Measurements: Height: 6' (182.9 cm) Weight: 164 lb 0.4 oz (74.4 kg) IBW/kg (Calculated) : 77.6  Vital Signs: Temp: 101.5 F (38.6 C) (02/25 0945) Temp src: Core (Comment) (02/25 0925) BP: 127/82 mmHg (02/25 0945) Pulse Rate: 130 (02/25 0945) Intake/Output from previous day:   Intake/Output from this shift:    Labs:  Recent Labs  07/10/13 0659 07/10/13 0945  WBC 17.4* 13.3*  HGB 13.3 12.3*  PLT 179 131*  CREATININE 21.54*  --    Estimated Creatinine Clearance: 4.5 ml/min (by C-G formula based on Cr of 21.54).  Microbiology: No results found for this or any previous visit (from the past 720 hour(s)).  Medical History: History reviewed. No pertinent past medical history.  Assessment: 47yom brought to the ED after he was found unresponsive and seizing by his family. He is ERSD on HD and received HD yesterday. He will begin unasyn for probable aspiration pneumonia.  Goal of Therapy:  Appropriate unasyn dosing  Plan:  1) Unasyn 3g IV q24 2) Follow cultures, LOT, clinical status  Deboraha Sprang 07/10/2013,10:47 AM

## 2013-07-10 NOTE — Consult Note (Addendum)
Renal Service Consult Note [facility]  Jeremy Mccoy 07/10/2013 Roney Jaffe D Requesting Physician:  Dr Nelda Marseille  Reason for Consult:  ESRD patient with seizures and HTN HPI: The patient is a 48 y.o. year-old with pmh of cocaine abuse, HTN. 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  On HD TTS schedule. Found unresponsive with seizures by family today.  Brought to ED and intubated. Seizures continuing despite mult meds. CT brain neg, cxr negative  ROS  none  Past Medical History History reviewed. No pertinent past medical history. Past Surgical History History reviewed. No pertinent past surgical history. Family History No family history on file. Social History  has no tobacco, alcohol, and drug history on file. Allergies Not on File Home medications Prior to Admission medications   Not on File   Liver Function Tests  Recent Labs Lab 07/10/13 0659 07/10/13 0945  AST 23 24  ALT 15 15  ALKPHOS 67 57  BILITOT 0.3 0.3  PROT 8.8* 7.7  ALBUMIN 4.9 4.5   No results found for this basename: LIPASE, AMYLASE,  in the last 168 hours CBC  Recent Labs Lab 07/10/13 0659 07/10/13 0945  WBC 17.4* 13.3*  NEUTROABS 15.8* 12.1*  HGB 13.3 12.3*  HCT 39.9 36.1*  MCV 95.0 90.0  PLT 179 [id number]*   Basic Metabolic Panel  Recent Labs Lab 07/10/13 0659 07/10/13 0945  NA 140 134*  K 5.4* 7.6*  CL 80* 83*  CO2 8* 18*  GLUCOSE 168* 142*  BUN 100* 109*  CREATININE 21.54* 22.78*  CALCIUM 10.9* 9.9    Filed Vitals:   07/10/13 0945 07/10/13 1000 07/10/13 1030 07/10/13 1100  BP: 127/82 132/87 114/76 117/76  Pulse: 130 117 119 115  Temp: 101.5 F (38.6 C) 101.7 F (38.7 C) 102.2 F (39 C) 102.6 F (39.2 C)  TempSrc:      Resp: 35 35 35 35  Height:      Weight:      SpO2: 100% 100% 100% 100%   Exam On vent, unresponsive No rash, cyanosis or gangrene Sclera anicteric, throat w ETT in place No jvd Chest is clear bilat RRR no MRG Abd  scaphoid, nd, +bs GU nl male genitalia Ext no LE or UE edema L forearm AVF patent, no signs of infection Neuro- intermittent bilat LE seizure activity about every 30-60 sec   Dialysis: TTS [facility] 4hr 14min  71kg  1K/2.5 Bath  Heparin none  LFA AVF  Hectorol 4 Labs: tsat 27%  pth 369   Assessment: 1 Seizures / status epilepticus- on pentobarb drip, intubated; multifactorial (^bp, missed HD, drug abuse) 2 Hyperkalemia, severe 3 ESRD- high Cr consistent poor compliance w op HD (signs off at 3hrs routinely, misses occ) 4 Hx cocaine abuse 5 HTN urgency- on cardene drip 6 Volume- up 3kg , no pulm edema 7 Anemia- Hb good, no esa 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  Urgent HD for high K and prob uremia as well. Will follow. Try to keep SBP 130-160 range (120-150 on HD).     Kelly Splinter MD (pgr) [phone]    ([phone] 07/10/2013, 11:56 AM

## 2013-07-10 NOTE — Progress Notes (Signed)
Vent changes made due to ABG results per MD order.

## 2013-07-10 NOTE — Procedures (Signed)
I was present at this dialysis session, have reviewed the session itself and made  appropriate changes  Kelly Splinter MD (pgr) [phone]    ([phone] 07/10/2013, 3:00 PM

## 2013-07-10 NOTE — ED Notes (Signed)
Pt has returned from being out of the department 

## 2013-07-10 NOTE — Progress Notes (Signed)
UR completed.  Velera Lansdale, RN BSN MHA CCM Trauma/Neuro ICU Case Manager [phone]  

## 2013-07-10 NOTE — Consult Note (Signed)
NEURO HOSPITALIST CONSULT NOTE    Reason for Consult: Status epilepticus  HPI:                                                                                                                                          Jeremy Mccoy is an 48 y.o. male with a past medical history significant for cocaine abuse, ESRD, found unresponsive and seizing by family earlier today, EMS summoned and patient brought to the ED where he was intubated and continues seizing despite receiving lorazepam 2 mg, 4 mg midazolam, 1 gram loading dose dilantin, and propofol. CT brain showed no acute abnormality. No further clinical history available at this time.   History reviewed. No pertinent past medical history.  History reviewed. No pertinent past surgical history.  No family history on file.  Family History: unknown   Social History:  has no tobacco, alcohol, and drug history on file.  Not on File  MEDICATIONS:                                                                                                                     I have reviewed the patient's current medications.   ROS:                                                                                                                                       History obtained from unobtainable from patient due to mental status  Physical exam: sedated with propofol, intubated on the vent. Blood pressure 149/82, pulse 139, temperature 97.9 F (36.6 C), resp. rate 33, weight 74.999 kg (165 lb 5.5 oz), SpO2 100.00%. Head: normocephalic. Neck: supple, no bruits, no JVD. Cardiac: no murmurs.  Lungs: clear. Abdomen: soft, no tender, no mass. Extremities: no edema.  Neurologic Examination:  On propofol.                                                                                                    Mental status: intubated on propofol. CN 2-12: pupils 3-4 mm bilaterally, reactive to light. No gaze preference. EOM  Full without  nystagmus. Face seems to be symmetric. Motor: patient seizing at this moment. Sensory: unreliable. Coordination and gait: unreliable. No meningeal signs.  No results found for this basename: cbc, bmp, coags, chol, tri, ldl, hga1c    Results for orders placed during the hospital encounter of 07/10/13 (from the past 48 hour(s))  CBC WITH DIFFERENTIAL     Status: Abnormal   Collection Time    07/10/13  6:59 AM      Result Value Ref Range   WBC 17.4 (*) 4.0 - 10.5 K/uL   RBC 4.20 (*) 4.22 - 5.81 MIL/uL   Hemoglobin 13.3  13.0 - 17.0 g/dL   HCT 39.9  39.0 - 52.0 %   MCV 95.0  78.0 - 100.0 fL   MCH 31.7  26.0 - 34.0 pg   MCHC 33.3  30.0 - 36.0 g/dL   RDW 14.8  11.5 - 15.5 %   Platelets 179  150 - 400 K/uL   Neutrophils Relative % 91 (*) 43 - 77 %   Lymphocytes Relative 4 (*) 12 - 46 %   Monocytes Relative 5  3 - 12 %   Eosinophils Relative 0  0 - 5 %   Basophils Relative 0  0 - 1 %   Neutro Abs 15.8 (*) 1.7 - 7.7 K/uL   Lymphs Abs 0.7  0.7 - 4.0 K/uL   Monocytes Absolute 0.9  0.1 - 1.0 K/uL   Eosinophils Absolute 0.0  0.0 - 0.7 K/uL   Basophils Absolute 0.0  0.0 - 0.1 K/uL  COMPREHENSIVE METABOLIC PANEL     Status: Abnormal   Collection Time    07/10/13  6:59 AM      Result Value Ref Range   Sodium 140  137 - 147 mEq/L   Potassium 5.4 (*) 3.7 - 5.3 mEq/L   Chloride 80 (*) 96 - 112 mEq/L   CO2 8 (*) 19 - 32 mEq/L   Comment: CRITICAL RESULT CALLED TO, READ BACK BY AND VERIFIED WITH:     TRACY BAST,RN AT 0813 07/10/13 BY ZBEECH.   Glucose, Bld 168 (*) 70 - 99 mg/dL   BUN 100 (*) 6 - 23 mg/dL   Creatinine, Ser 21.54 (*) 0.50 - 1.35 mg/dL   Calcium 10.9 (*) 8.4 - 10.5 mg/dL   Total Protein 8.8 (*) 6.0 - 8.3 g/dL   Albumin 4.9  3.5 - 5.2 g/dL   AST 23  0 - 37 U/L   ALT 15  0 - 53 U/L   Alkaline Phosphatase 67  39 - 117 U/L   Total Bilirubin 0.3  0.3 - 1.2 mg/dL   GFR calc non Af Amer 2 (*) >90 mL/min  GFR calc Af Amer 2 (*) >90 mL/min   Comment: (NOTE)     The eGFR has  been calculated using the CKD EPI equation.     This calculation has not been validated in all clinical situations.     eGFR's persistently <90 mL/min signify possible Chronic Kidney     Disease.  PRO B NATRIURETIC PEPTIDE     Status: Abnormal   Collection Time    07/10/13  6:59 AM      Result Value Ref Range   Pro B Natriuretic peptide (BNP) 29037.0 (*) 0 - 125 pg/mL  PROCALCITONIN     Status: None   Collection Time    07/10/13  6:59 AM      Result Value Ref Range   Procalcitonin 0.63     Comment:            Interpretation:     PCT > 0.5 ng/mL and <= 2 ng/mL:     Systemic infection (sepsis) is possible,     but other conditions are known to elevate     PCT as well.     (NOTE)             ICU PCT Algorithm               Non ICU PCT Algorithm        ----------------------------     ------------------------------             PCT < 0.25 ng/mL                 PCT < 0.1 ng/mL         Stopping of antibiotics            Stopping of antibiotics           strongly encouraged.               strongly encouraged.        ----------------------------     ------------------------------           PCT level decrease by               PCT < 0.25 ng/mL           >= 80% from peak PCT           OR PCT 0.25 - 0.5 ng/mL          Stopping of antibiotics                                                 encouraged.         Stopping of antibiotics               encouraged.        ----------------------------     ------------------------------           PCT level decrease by              PCT >= 0.25 ng/mL           < 80% from peak PCT            AND PCT >= 0.5 ng/mL            Continuing antibiotics  encouraged.           Continuing antibiotics                encouraged.        ----------------------------     ------------------------------         PCT level increase compared          PCT > 0.5 ng/mL             with peak PCT AND              PCT >= 0.5  ng/mL             Escalation of antibiotics                                              strongly encouraged.          Escalation of antibiotics            strongly encouraged.  PROTIME-INR     Status: Abnormal   Collection Time    07/10/13  6:59 AM      Result Value Ref Range   Prothrombin Time 15.5 (*) 11.6 - 15.2 seconds   INR 1.26  0.00 - 1.49  LACTIC ACID, PLASMA     Status: Abnormal   Collection Time    07/10/13  6:59 AM      Result Value Ref Range   Lactic Acid, Venous 22.5 (*) 0.5 - 2.2 mmol/L  I-STAT TROPOININ, ED     Status: None   Collection Time    07/10/13  7:19 AM      Result Value Ref Range   Troponin i, poc 0.05  0.00 - 0.08 ng/mL   Comment 3            Comment: Due to the release kinetics of cTnI,     a negative result within the first hours     of the onset of symptoms does not rule out     myocardial infarction with certainty.     If myocardial infarction is still suspected,     repeat the test at appropriate intervals.  I-STAT CG4 LACTIC ACID, ED     Status: Abnormal   Collection Time    07/10/13  7:21 AM      Result Value Ref Range   Lactic Acid, Venous >17.00 (*) 0.5 - 2.2 mmol/L  I-STAT ARTERIAL BLOOD GAS, ED     Status: Abnormal   Collection Time    07/10/13  8:07 AM      Result Value Ref Range   pH, Arterial 7.230 (*) 7.350 - 7.450   pCO2 arterial 47.4 (*) 35.0 - 45.0 mmHg   pO2, Arterial 507.0 (*) 80.0 - 100.0 mmHg   Bicarbonate 19.9 (*) 20.0 - 24.0 mEq/L   TCO2 21  0 - 100 mmol/L   O2 Saturation 100.0     Acid-base deficit 8.0 (*) 0.0 - 2.0 mmol/L   Collection site RADIAL, ALLEN'S TEST ACCEPTABLE     Drawn by Operator     Sample type ARTERIAL      Ct Head Wo Contrast  07/10/2013   CLINICAL DATA:  Severe headache.  Unresponsive.  EXAM: CT HEAD WITHOUT CONTRAST  TECHNIQUE: Contiguous axial images were obtained from the base of the skull through the vertex without intravenous contrast.  COMPARISON:  No comparison studies available.  FINDINGS:  There is no evidence for acute hemorrhage, hydrocephalus, mass lesion, or abnormal extra-axial fluid collection. No definite CT evidence for acute infarction. Polypoid mucosal disease is seen in the right maxillary sinus. Remaining visualized paranasal sinuses are clear. No evidence for mastoid air cell fluid. A nasal tube is evident.  IMPRESSION: No acute intracranial abnormality.   Electronically Signed   By: Misty Stanley M.D.   On: 07/10/2013 07:31   Dg Chest Port 1 View  07/10/2013   CLINICAL DATA:  Unresponsive patient.  Endotracheal tube placement.  EXAM: PORTABLE CHEST - 1 VIEW  COMPARISON:  None.  FINDINGS: Endotracheal tube tip is just below the thoracic inlet, 7.1 cm above the carina. Nasogastric tube extends as far as the gastroesophageal junction and quite likely into the stomach.  Tiny punctate densities scattered in the lungs are observed, suggesting small calcified nodules.  Heart size within normal limits for technique. The lungs appear otherwise clear.  IMPRESSION: 1. Multiple tiny dense nodules in the lungs probably represent calcified tiny nodules, in the 3 mm range. Most likely causes are remote varicella infection or old granulomatous disease, although there are post of different possibilities for this type of appearance, including hyperparathyroidism, metastatic disease from osteosarcoma, alveolar microlithiasis, and pulmonary hemosiderosis, among others.   Electronically Signed   By: Sherryl Barters M.D.   On: 07/10/2013 07:17    Assessment/Plan: 48 y/o with ESRD, cocaine abuse, and refractory GTC-SE. Recommend: 1) Keppra 1 gram IV now. 2) Pentobarbital loading dose 10 mg x kg and infusion 3 mg/kg/hr to burst suppression. 3) Stop Propofol as soon as pentobarbital started. 4) Continue IV dilantin 100 mg TID, Keppra 500 mg BID. 5) C-EEG monitoring 6) MRI brain when feasible. Will follow up.  Dorian Pod, MD 07/10/2013, 8:47 AM [facility]

## 2013-07-10 NOTE — ED Notes (Signed)
Patient was found unresponsive in bed by family. Patient is a dialysis patient and had dialysis yesterday. Family reports patient vomited all day yesterday. EMS gave 1mg  of Narcan and patient had no change. Patient began to have full body seixing and EMS gave 2.5 of Versed. CBG 119

## 2013-07-10 NOTE — ED Notes (Signed)
Pt transported to CT ?

## 2013-07-10 NOTE — Procedures (Signed)
Central Venous Catheter Insertion Procedure Note Jeremy Mccoy [id number] 1965/11/28  Procedure: Insertion of Central Venous Catheter Indications: Assessment of intravascular volume, Drug and/or fluid administration and Frequent blood sampling  Procedure Details Consent: Unable to obtain consent because of altered level of consciousness. Time Out: Verified patient identification, verified procedure, site/side was marked, verified correct patient position, special equipment/implants available, medications/allergies/relevent history reviewed, required imaging and test results available.  Performed  Maximum sterile technique was used including antiseptics, cap, gloves, gown, hand hygiene, mask and sheet. Skin prep: Chlorhexidine; local anesthetic administered A antimicrobial bonded/coated triple lumen catheter was placed in the left internal jugular vein using the Seldinger technique.  Evaluation Blood flow good Complications: No apparent complications Patient did tolerate procedure well. Chest X-ray ordered to verify placement.  CXR: pending.  U/S used in placement.  Jeremy Mccoy 07/10/2013, 8:41 AM

## 2013-07-10 NOTE — ED Provider Notes (Signed)
CSN: [id number]     Arrival date & time 07/10/13  [id number] History   First MD Initiated Contact with Patient 07/10/13 [phone]     Chief Complaint  Patient presents with  . Unresponsive      (Consider location/radiation/quality/duration/timing/severity/associated sxs/prior Treatment) HPI Comments: The patient is an Caucasian male, on dialysis, lives with family according to paramedics. He was found unresponsive by family members in a bed, he is a dialysis patient last had dialysis yesterday and vomited all day yesterday after dialysis. The family was unable to awaken, the paramedics found the patient on his face, sonorous respirations at a rate of 46 breaths per minute, tachycardic with strong pulses but otherwise unresponsive. They administered 1 mg of Narcan with no change in mental status. There was a period where he had generalized tonic-clonic seizure activity which was witnessed by paramedics, they dosed 2 mg of Versed prior to arrival, there was cessation of the seizure activity, a nasal trumpet was placed and the patient was assisted with bag-valve-mask ventilation in route. He was unresponsive to deep stimuli, his body was stiff, the patient was and is critically ill and unresponsive and unable to give any history.  The history is provided by the EMS personnel.    History reviewed. No pertinent past medical history. History reviewed. No pertinent past surgical history. No family history on file. History  Substance Use Topics  . Smoking status: Not on file  . Smokeless tobacco: Not on file  . Alcohol Use: Not on file    Review of Systems  Unable to perform ROS: Acuity of condition      Allergies  Review of patient's allergies indicates not on file.  Home Medications  No current outpatient prescriptions on file. BP 230/124  Resp 18  SpO2 100% Physical Exam  Nursing note and vitals reviewed. Constitutional: He appears well-developed and well-nourished. He appears distressed.   HENT:  Head: Normocephalic and atraumatic.  Mouth/Throat: No oropharyngeal exudate.  The left anterior tongue has a bite mark, no active bleeding, dentition seems to be intact  Eyes: Conjunctivae and EOM are normal. Pupils are equal, round, and reactive to light. Right eye exhibits no discharge. Left eye exhibits no discharge. No scleral icterus.  Disconjugate gaze, reactive pupils bilaterally  Neck: Normal range of motion. Neck supple. No JVD present. No thyromegaly present.  Cardiovascular: Regular rhythm, normal heart sounds and intact distal pulses.  Exam reveals no gallop and no friction rub.   No murmur heard. And tachycardia to 150 beats per minute, strong pulses at the radial arteries no JVD present.  Left forearm access with good thrill, no overlying erythema or induration  Pulmonary/Chest: Breath sounds normal. He is in respiratory distress. He has no wheezes. He has no rales.  Severely tachypneic on arrival, shallow breathing, no significant abnormal lung  Abdominal: Soft. Bowel sounds are normal. He exhibits no distension and no mass. There is no tenderness.  Musculoskeletal: Normal range of motion. He exhibits no edema and no tenderness.  Lymphadenopathy:    He has no cervical adenopathy.  Neurological: He is alert. Coordination normal.  GCS of 3, does not respond to painful stimuli, diffuse body stiffness and posturing, no seizure activity  Skin: Skin is warm and dry. No rash noted. No erythema.  Psychiatric: He has a normal mood and affect. His behavior is normal.    ED Course  Procedures (including critical care time) Labs Review Labs Reviewed  CULTURE, BLOOD (ROUTINE X 2)  CULTURE, BLOOD (ROUTINE  X 2)  URINE CULTURE  CBC WITH DIFFERENTIAL  COMPREHENSIVE METABOLIC PANEL  URINALYSIS, ROUTINE W REFLEX MICROSCOPIC  PRO B NATRIURETIC PEPTIDE  PROCALCITONIN  PROTIME-INR  I-STAT CG4 LACTIC ACID, ED  I-STAT ARTERIAL BLOOD GAS, ED  I-STAT TROPOININ, ED   Imaging  Review No results found.  ED ECG REPORT  I personally interpreted this EKG   Date: 07/11/2013   Rate: 156  Rhythm: sinus tachycardia  QRS Axis: normal  Intervals: normal  ST/T Wave abnormalities: nonspecific ST/T changes  Conduction Disutrbances:none  Narrative Interpretation:   Old EKG Reviewed: none available   MDM   Diagnosis:  #1 - Status Epilepticus,  #2 - Acidosis,  #3 - renal failure,  #4 - Severe Hypertension  The patient had urinary and incontinence and tongue biting after seizure activity, his altered mental status proceeded seizure activity though it may have been unwitnessed prior to this as well. The patient is severely hypertensive with a blood pressure of 250/130, he will require multiple different interventions including blood pressure control with intravenous drip, CT scan of the head to rule out intercranial hemorrhage or stroke, mass. He will need a chest x-ray to evaluate his pulmonary and cardiovascular systems, intubation occurred on arrival secondary to the patient's lack of control over his airway, rapid breathing and altered mental status with low Glasgow Coma Score. There is no signs of trauma including head or extremities, EKG shows a sinus tachycardia with pulse T waves in V3 but no signs of acute ischemia. Propofol started for sedation.  The pt continued to have altered MS - he required intubation secondary to onging inability to protect airway.  His MS suggests ongoing seizure - he was started on several medications as below;  #1 - Nitro gtt for severe hypertensino #2 - Dilantin for status epilepticus #3 - Propofol for sedation and status #4 - Ativan for status   The pt had a significant leukocytosis, a very elevated lactic acid and labs confirm that though he is a renal failure / dialysis patient his K is not acutely elevated.  The pt continued to have ongoing seizure activity - Critical Care and Neurology consulted to be actively involved in the care of  this critically ill patient.  INTUBATION Performed by: Johnna Acosta  Required items: required blood products, implants, devices, and special equipment available Patient identity confirmed: provided demographic data and hospital-assigned identification number Time out: Immediately prior to procedure a "time out" was called to verify the correct patient, procedure, equipment, support staff and site/side marked as required.  Indications: Status Epilepticus  Intubation method: Direct Laryngoscopy   Preoxygenation: BVM  Sedatives: Versed Paralytic: Rocuronium  Tube Size: 7.5 cuffed  Post-procedure assessment: chest rise and ETCO2 monitor Breath sounds: equal and absent over the epigastrium Tube secured with: ETT holder Chest x-ray interpreted by radiologist and me.  Chest x-ray findings: endotracheal tube in appropriate position  Patient tolerated the procedure well with no immediate complications.     CRITICAL CARE Performed by: Johnna Acosta Total critical care time: 50 Critical care time was exclusive of separately billable procedures and treating other patients. Critical care was necessary to treat or prevent imminent or life-threatening deterioration. Critical care was time spent personally by me on the following activities: development of treatment plan with patient and/or surrogate as well as nursing, discussions with consultants, evaluation of patient's response to treatment, examination of patient, obtaining history from patient or surrogate, ordering and performing treatments and interventions, ordering and review of laboratory studies,  ordering and review of radiographic studies, pulse oximetry and re-evaluation of patient's condition.   Johnna Acosta, MD 07/11/13 [phone]

## 2013-07-10 NOTE — Procedures (Signed)
Arterial Catheter Insertion Procedure Note Jeremy Mccoy [id number] November 09, 1965  Procedure: Insertion of Arterial Catheter  Indications: Blood pressure monitoring and Frequent blood sampling  Procedure Details Consent: Unable to obtain consent because of emergent medical necessity. Time Out: Verified patient identification, verified procedure, site/side was marked, verified correct patient position, special equipment/implants available, medications/allergies/relevent history reviewed, required imaging and test results available.  Performed  Maximum sterile technique was used including antiseptics, cap, gloves, gown, hand hygiene, mask and sheet. Skin prep: Chlorhexidine; local anesthetic administered 20 gauge catheter was inserted into right radial artery using the Seldinger technique.  Evaluation Blood flow good; BP tracing good. Complications: No apparent complications.   Jeremy Mccoy 07/10/2013

## 2013-07-10 NOTE — Progress Notes (Signed)
Sputum sample obtained and sent to the lab at this time. RT will monitor.

## 2013-07-10 NOTE — ED Notes (Signed)
2 warm blankets placed on pt.

## 2013-07-10 NOTE — Progress Notes (Signed)
INITIAL NUTRITION ASSESSMENT  DOCUMENTATION CODES Per approved criteria  -Severe malnutrition in the context of chronic illness   INTERVENTION:  Initiate Nepro at 20 ml/hr and increase by 10 ml/hr every 8 hours to reach goal rate of 60 ml/hr.  TF regimen provides 2592 kcal (97% of estimated needs), 116 grams protein (100% of estimated needs), and 1047 ml free water daily  Patient is at refeeding risk, recommend monitoring potassium, magnesium, and phosphorous for 3 days  NUTRITION DIAGNOSIS: Inadequate oral intake related to inability to eat as evidenced by NPO status.   Goal: Patient to meet >/= 90% of estimated nutrition needs  Monitor:  Vent status, TF tolerance/adequacy, weight trends, labs  Reason for Assessment: MD Consult for TF Initiation/Management  48 y.o. male  Admitting Dx: Seizure and AMS  ASSESSMENT: 48 year old male with history of ESRD and cocaine abuse who was found down by family on 2/25 seizing. EMS was called and patient was brought to the ED. Was noted that he was not protecting his airway and was intubated. PCCM was called to admit. Patient is actively seizing post intubation and after paralytics were off.  Patient was dialyzing upon dietetic intern visit. Per conversation with nurse, patient is likely an active cocaine and ETOH abuser. Dietetic intern attempted to contact patient's mother via telephone to gather nutrition history, however dietetic intern got a busy signal upon calling.    Electrolytes hanging.  Patient is currently intubated on ventilator support. MV:  22 L/min Temp (24hrs), Avg:100.7 F (38.2 C), Min:93.9 F (34.4 C), Max:103.1 F (39.5 C)  Potassium elevated at 7.6, no phosphorus or magnesium level available but are pending.   Nutrition Focused Physical Exam:  Subcutaneous Fat:  Orbital Region: moderate depletion Upper Arm Region: severe depletion Thoracic and Lumbar Region: moderate depletion  Muscle:  Temple Region:  moderate depletion Clavicle Bone Region: severe depletion Clavicle and Acromion Bone Region: mild depletion Scapular Bone Region: N/A Dorsal Hand: WNL Patellar Region: moderate depletion Anterior Thigh Region: mild depletion Posterior Calf Region: moderate depletion  Edema: none present  Patient meets criteria for severe malnutrition in the context of chronic illness as evidenced by severe body fat and muscle mass depletion.  Patient is at risk for refeeding syndrome. Recommend monitoring potassium, magnesium, and phosphorous for 3 days.   Height: Ht Readings from Last 1 Encounters:  07/10/13 6' (1.829 m)    Weight: Wt Readings from Last 1 Encounters:  07/10/13 168 lb 14 oz (76.6 kg)    Ideal Body Weight: 178 lb (80.9 kg)  % Ideal Body Weight: 94.4  Wt Readings from Last 10 Encounters:  07/10/13 168 lb 14 oz (76.6 kg)    Usual Body Weight: Unknown  % Usual Body Weight: Unable to assess  BMI:  Body mass index is 22.9 kg/(m^2).  Estimated Nutritional Needs: Kcal: 2681 Protein: 107-125 grams Fluid: >/=1.2 L/day  Skin: no wounds  Diet Order: NPO  EDUCATION NEEDS: -Education not appropriate at this time   Intake/Output Summary (Last 24 hours) at 07/10/13 1340 Last data filed at 07/10/13 1200  Gross per 24 hour  Intake 349.47 ml  Output      0 ml  Net 349.47 ml    Last BM: PTA  Labs:   Recent Labs Lab 07/10/13 0659 07/10/13 0945  NA 140 134*  K 5.4* 7.6*  CL 80* 83*  CO2 8* 18*  BUN 100* 109*  CREATININE 21.54* 22.78*  CALCIUM 10.9* 9.9  GLUCOSE 168* 142*  CBG (last 3)  No results found for this basename: GLUCAP,  in the last 72 hours  Scheduled Meds: . ampicillin-sulbactam (UNASYN) IV  3 g Intravenous Q24H  . heparin subcutaneous  5,000 Units Subcutaneous 3 times per day  . levETIRAcetam  500 mg Intravenous Q12H  . pantoprazole (PROTONIX) IV  40 mg Intravenous Q24H  . [START ON 07/11/2013] phenytoin (DILANTIN) IV  100 mg Intravenous 3  times per day    Continuous Infusions: . fentaNYL infusion INTRAVENOUS 50 mcg/hr (07/10/13 1200)  . niCARDipine Stopped (07/10/13 1015)  . norepinephrine (LEVOPHED) Adult infusion 4 mcg/min (07/10/13 1200)  . PENTobarbital (NEMBUTAL) infusion 3 mg/kg/hr (07/10/13 1200)    History reviewed. No pertinent past medical history.  History reviewed. No pertinent past surgical history.  Claudell Kyle, Dietetic Intern Pager: [phone]  Intern note/chart reviewed. Revisions made.  [place], [place], [place] Pager [phone] After Hours Pager

## 2013-07-11 ENCOUNTER — Inpatient Hospital Stay (HOSPITAL_COMMUNITY): Payer: Medicare Other

## 2013-07-11 LAB — BLOOD GAS, ARTERIAL
Acid-Base Excess: 1.2 mmol/L (ref 0.0–2.0)
Bicarbonate: 25.3 mEq/L — ABNORMAL HIGH (ref 20.0–24.0)
DRAWN BY: 40415
FIO2: 0.4 %
LHR: 18 {breaths}/min
O2 Saturation: 99.1 %
PATIENT TEMPERATURE: 98.6
PEEP: 5 cmH2O
PH ART: 7.414 (ref 7.350–7.450)
TCO2: 26.5 mmol/L (ref 0–100)
VT: 620 mL
pCO2 arterial: 40.2 mmHg (ref 35.0–45.0)
pO2, Arterial: 135 mmHg — ABNORMAL HIGH (ref 80.0–100.0)

## 2013-07-11 LAB — GLUCOSE, CAPILLARY
GLUCOSE-CAPILLARY: 107 mg/dL — AB (ref 70–99)
GLUCOSE-CAPILLARY: 119 mg/dL — AB (ref 70–99)
GLUCOSE-CAPILLARY: 82 mg/dL (ref 70–99)
GLUCOSE-CAPILLARY: 96 mg/dL (ref 70–99)
Glucose-Capillary: 102 mg/dL — ABNORMAL HIGH (ref 70–99)
Glucose-Capillary: 109 mg/dL — ABNORMAL HIGH (ref 70–99)
Glucose-Capillary: 80 mg/dL (ref 70–99)

## 2013-07-11 LAB — CBC
HCT: 29.3 % — ABNORMAL LOW (ref 39.0–52.0)
Hemoglobin: 9.6 g/dL — ABNORMAL LOW (ref 13.0–17.0)
MCH: 30.5 pg (ref 26.0–34.0)
MCHC: 32.8 g/dL (ref 30.0–36.0)
MCV: 93 fL (ref 78.0–100.0)
Platelets: 102 10*3/uL — ABNORMAL LOW (ref 150–400)
RBC: 3.15 MIL/uL — ABNORMAL LOW (ref 4.22–5.81)
RDW: 15.2 % (ref 11.5–15.5)
WBC: 7.6 10*3/uL (ref 4.0–10.5)

## 2013-07-11 LAB — BASIC METABOLIC PANEL
BUN: 50 mg/dL — ABNORMAL HIGH (ref 6–23)
CO2: 23 mEq/L (ref 19–32)
CREATININE: 12.17 mg/dL — AB (ref 0.50–1.35)
Calcium: 8.3 mg/dL — ABNORMAL LOW (ref 8.4–10.5)
Chloride: 102 mEq/L (ref 96–112)
GFR, EST AFRICAN AMERICAN: 5 mL/min — AB (ref >90)
GFR, EST NON AFRICAN AMERICAN: 4 mL/min — AB (ref >90)
GLUCOSE: 110 mg/dL — AB (ref 70–99)
Potassium: 4.8 mEq/L (ref 3.7–5.3)
Sodium: 142 mEq/L (ref 137–147)

## 2013-07-11 LAB — MAGNESIUM: Magnesium: 2.4 mg/dL (ref 1.5–2.5)

## 2013-07-11 LAB — PHOSPHORUS: Phosphorus: 9.6 mg/dL — ABNORMAL HIGH (ref 2.3–4.6)

## 2013-07-11 MED ORDER — SODIUM CHLORIDE 0.9 % IV BOLUS (SEPSIS): 500.0000 mL | INTRAVENOUS | Status: DC | PRN

## 2013-07-11 MED ORDER — SODIUM CHLORIDE 0.9 % IV BOLUS (SEPSIS)
500.0000 mL | Freq: Once | INTRAVENOUS | Status: AC
  Administered 2013-07-11: 500 mL via INTRAVENOUS

## 2013-07-11 NOTE — Progress Notes (Signed)
No SBT/weaning done due to pt on pressors at this time. No complications noted. RT will monitor.

## 2013-07-11 NOTE — Progress Notes (Signed)
  [facility] Progress Note   Subjective: on pentobarb, no seizures now, on 7ug/m levo drip, goal SBP over 130  Filed Vitals:   07/11/13 0500 07/11/13 0600 07/11/13 0740 07/11/13 0800  BP: 102/69 101/67 106/66 123/83  Pulse: 90 91 80 80  Temp: 100 F (37.8 C)     TempSrc:      Resp: 18 18 18 18   Height:      Weight: 76.1 kg (167 lb 12.3 oz)     SpO2: 100% 100% 100% 100%  Exam: On vent, unresponsive  ETT in place  No jvd  Chest is clear bilat  RRR no MRG  Abd scaphoid, nd, +bs  Ext no LE or UE edema  L forearm AVF patent Neuro- sedated, on vent  CXR- no sig dz  Dialysis: TTS [facility]  4hr 69min 71kg 1K/2.5 [place] Heparin none LFA AVF  Hectorol 4  Labs: tsat 27% pth 369   Assessment:  1 Seizures / status epilepticus- on pentobarb drip, intubated 2 Hyperkalemia- resolved 3 ESRD- hx of poor compliance w HD sessions 4 Hx cocaine abuse  5 HTN urgency- resolved 6 HTN/Volume- 3kg off yest , now CVP's zero, wt's not accurate 7 Anemia- Hb good, no esa  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   Plan: NS boluses, get CVP up, wean pressors if SBP over 120; HD tomorrow   Kelly Splinter MD  pager [phone]    cell [phone]  07/11/2013, 8:50 AM     Recent Labs Lab 07/10/13 0659 07/10/13 0945 07/11/13 0500  NA 140 134* 142  K 5.4* 7.6* 4.8  CL 80* 83* 102  CO2 8* 18* 23  GLUCOSE 168* 142* 110*  BUN 100* 109* 50*  CREATININE 21.54* 22.78* 12.17*  CALCIUM 10.9* 9.9 8.3*  PHOS  --   --  9.6*    Recent Labs Lab 07/10/13 0659 07/10/13 0945  AST 23 24  ALT 15 15  ALKPHOS 67 57  BILITOT 0.3 0.3  PROT 8.8* 7.7  ALBUMIN 4.9 4.5    Recent Labs Lab 07/10/13 0659 07/10/13 0945 07/11/13 0500  WBC 17.4* 13.3* 7.6  NEUTROABS 15.8* 12.1*  --   HGB 13.3 12.3* 9.6*  HCT 39.9 36.1* 29.3*  MCV 95.0 90.0 93.0  PLT 179 131* 102*   . ampicillin-sulbactam (UNASYN) IV  3 g Intravenous Q24H  . antiseptic oral rinse  15 mL Mouth Rinse QID  . chlorhexidine  15 mL  Mouth Rinse BID  . feeding supplement (NEPRO CARB STEADY)  1,000 mL Per Tube Q24H  . heparin subcutaneous  5,000 Units Subcutaneous 3 times per day  . levETIRAcetam  500 mg Intravenous Q12H  . pantoprazole (PROTONIX) IV  40 mg Intravenous Q24H  . phenytoin (DILANTIN) IV  100 mg Intravenous 3 times per day   . fentaNYL infusion INTRAVENOUS 50 mcg/hr (07/11/13 0800)  . niCARDipine Stopped (07/10/13 1015)  . norepinephrine (LEVOPHED) Adult infusion 7 mcg/min (07/11/13 0800)  . PENTobarbital (NEMBUTAL) infusion 3 mg/kg/hr (07/11/13 0800)   sodium chloride, sodium chloride, acetaminophen (TYLENOL) oral liquid 160 mg/5 mL, feeding supplement (NEPRO CARB STEADY), heparin, lidocaine (PF), lidocaine-prilocaine, pentafluoroprop-tetrafluoroeth

## 2013-07-11 NOTE — Progress Notes (Signed)
Breakdown noted on pts lips. RN aware

## 2013-07-11 NOTE — Progress Notes (Signed)
Day 2 LTVM EEG in progress. 

## 2013-07-11 NOTE — Progress Notes (Signed)
Pulmonary/Critical Care Progress Note   Name: Jeremy Mccoy MRN: [id number] DOB: 1966/03/08    ADMISSION DATE:  07/10/2013 CONSULTATION DATE:  07/10/2013  REFERRING MD :  Dr. Zenia Resides, ED. PRIMARY SERVICE: PCCM  CHIEF COMPLAINT:  Seizure and AMS  BRIEF PATIENT DESCRIPTION: 48 year old male with history of ESRD and cocaine abuse who was found down by family on 2/25 seizing.  EMS was called and patient was brought to the ED.  Was noted that he was not protecting his airway and was intubated.  PCCM was called to admit.  Patient is actively seizing post intubation and after paralytics were off.  SIGNIFICANT EVENTS / STUDIES:  2/25 seizure intubated  LINES / TUBES: PIV ET tube 2/25>>> L IJ TLC 2/25>>> R radial a-line 2/25>>>  CULTURES: Blood 2/25>>> Urine 2/25>>> Sputum 2/25>>>  ANTIBIOTICS: Unasyn 2/25>>>  SUBJECTIVE: Sedated and intubated.  VITAL SIGNS: Temp:  [99.9 F (37.7 C)-103.1 F (39.5 C)] 100 F (37.8 C) (02/26 0500) Pulse Rate:  [77-130] 77 (02/26 0900) Resp:  [18-35] 18 (02/26 0900) BP: (101-136)/(66-100) 121/81 mmHg (02/26 0900) SpO2:  [97 %-100 %] 100 % (02/26 0900) FiO2 (%):  [30 %-40 %] 40 % (02/26 0800) Weight:  [73.6 kg (162 lb 4.1 oz)-76.6 kg (168 lb 14 oz)] 76.1 kg (167 lb 12.3 oz) (02/26 0500)  HEMODYNAMICS: CVP:  [0 mmHg-7 mmHg] 0 mmHg  VENTILATOR SETTINGS: Vent Mode:  [-] PRVC FiO2 (%):  [30 %-40 %] 40 % Set Rate:  [18 bmp-35 bmp] 18 bmp Vt Set:  [620 mL] 620 mL PEEP:  [5 cmH20] 5 cmH20 Plateau Pressure:  [16 cmH20-22 cmH20] 16 cmH20  INTAKE / OUTPUT: Intake/Output     02/25 0701 - 02/26 0700 02/26 0701 - 02/27 0700   I.V. (mL/kg) 1179.4 (15.5) 101.6 (1.3)   NG/GT 189 40   IV Piggyback 315    Total Intake(mL/kg) 1683.4 (22.1) 141.6 (1.9)   Other 3000    Total Output 3000     Net -1316.6 +141.6         PHYSICAL EXAMINATION: General:  Chronically ill appearing male, no longer seizing. Neuro: Sedated, intubate, does not withdraw to  pain.Marland Kitchen HEENT:  [place]/AT, PERRL, EOM-I and bloody mucous membranes. Cardiovascular:  Regular and tachy, Nl S1/S2, -M/R/G. Lungs:  CTA bilaterally. Abdomen:  Soft, NT, ND and +BS. Musculoskeletal:  Active seizure. Skin:  Intact.  LABS:  CBC  Recent Labs Lab 07/10/13 0659 07/10/13 0945 07/11/13 0500  WBC 17.4* 13.3* 7.6  HGB 13.3 12.3* 9.6*  HCT 39.9 36.1* 29.3*  PLT 179 131* 102*   Coag's  Recent Labs Lab 07/10/13 0659 07/10/13 0945  APTT  --  29  INR 1.26 1.12   BMET  Recent Labs Lab 07/10/13 0659 07/10/13 0945 07/11/13 0500  NA 140 134* 142  K 5.4* 7.6* 4.8  CL 80* 83* 102  CO2 8* 18* 23  BUN 100* 109* 50*  CREATININE 21.54* 22.78* 12.17*  GLUCOSE 168* 142* 110*   Electrolytes  Recent Labs Lab 07/10/13 0659 07/10/13 0945 07/11/13 0500  CALCIUM 10.9* 9.9 8.3*  MG  --   --  2.4  PHOS  --   --  9.6*   Sepsis Markers  Recent Labs Lab 07/10/13 0659 07/10/13 0721 07/10/13 0945  LATICACIDVEN 22.5* >17.00* 3.1*  PROCALCITON 0.63  --   --    ABG  Recent Labs Lab 07/10/13 0903 07/10/13 1717 07/11/13 0404  PHART 7.291* 7.603* 7.414  PCO2ART 38.2 30.0* 40.2  PO2ART 103.0*  83.0 135.0*   Liver Enzymes  Recent Labs Lab 07/10/13 0659 07/10/13 0945  AST 23 24  ALT 15 15  ALKPHOS 67 57  BILITOT 0.3 0.3  ALBUMIN 4.9 4.5   Cardiac Enzymes  Recent Labs Lab 07/10/13 0659  PROBNP 29037.0*   Glucose  Recent Labs Lab 07/10/13 1603 07/11/13 0011 07/11/13 0357 07/11/13 0754  GLUCAP 75 102* 109* 107*    Imaging Ct Head Wo Contrast  07/10/2013   CLINICAL DATA:  Severe headache.  Unresponsive.  EXAM: CT HEAD WITHOUT CONTRAST  TECHNIQUE: Contiguous axial images were obtained from the base of the skull through the vertex without intravenous contrast.  COMPARISON:  No comparison studies available.  FINDINGS: There is no evidence for acute hemorrhage, hydrocephalus, mass lesion, or abnormal extra-axial fluid collection. No definite CT evidence  for acute infarction. Polypoid mucosal disease is seen in the right maxillary sinus. Remaining visualized paranasal sinuses are clear. No evidence for mastoid air cell fluid. A nasal tube is evident.  IMPRESSION: No acute intracranial abnormality.   Electronically Signed   By: Misty Stanley M.D.   On: 07/10/2013 07:31   Dg Chest Port 1 View  07/11/2013   CLINICAL DATA:  Intubation.  EXAM: PORTABLE CHEST - 1 VIEW  COMPARISON:  DG CHEST PORT 1VSAME DAY dated 07/10/2013  FINDINGS: Endotracheal tube, NG tube in stable position. Interim placement of left IJ line, its tip is projected over the superior vena cava. Stable cardiomegaly. Mild interstitial prominence particularly left lower lobe noted. Mild pneumonitis or interstitial pulmonary edema cannot be excluded. No pleural effusion or pneumothorax. No acute osseous abnormality.  IMPRESSION: 1. Endotracheal tube and NG tube in stable position. Interval placement of left IJ line, its tip is projected over superior vena cava. 2. Persistent cardiomegaly . 3. Interim increase in interstitial markings particularly in the left lower lobe. Interstitial pneumonitis and/or mild interstitial pulmonary edema could present in this fashion.   Electronically Signed   By: Marcello Moores  Register   On: 07/11/2013 08:02   Dg Chest Port 1 View  07/10/2013   CLINICAL DATA:  Unresponsive patient.  Endotracheal tube placement.  EXAM: PORTABLE CHEST - 1 VIEW  COMPARISON:  None.  FINDINGS: Endotracheal tube tip is just below the thoracic inlet, 7.1 cm above the carina. Nasogastric tube extends as far as the gastroesophageal junction and quite likely into the stomach.  Tiny punctate densities scattered in the lungs are observed, suggesting small calcified nodules.  Heart size within normal limits for technique. The lungs appear otherwise clear.  IMPRESSION: 1. Multiple tiny dense nodules in the lungs probably represent calcified tiny nodules, in the 3 mm range. Most likely causes are remote  varicella infection or old granulomatous disease, although there are post of different possibilities for this type of appearance, including hyperparathyroidism, metastatic disease from osteosarcoma, alveolar microlithiasis, and pulmonary hemosiderosis, among others.   Electronically Signed   By: Sherryl Barters M.D.   On: 07/10/2013 07:17   Dg Chest Port 1v Same Day  07/10/2013   CLINICAL DATA:  Central line placement  EXAM: PORTABLE CHEST - 1 VIEW SAME DAY  COMPARISON:  07/10/2013  FINDINGS: Left jugular catheter tip in the SVC.  No pneumothorax.  Endotracheal tube in good position.  NG tube enters the stomach.  The lungs are clear without infiltrate or effusion. Vascularity normal.  IMPRESSION: Satisfactory central line placement.   Electronically Signed   By: Franchot Gallo M.D.   On: 07/10/2013 09:09   Dg Abd  Portable 1v  07/10/2013   CLINICAL DATA:  Orogastric tube placement.  EXAM: PORTABLE ABDOMEN - 1 VIEW  COMPARISON:  None.  FINDINGS: Orogastric tube extends below the diaphragm to curl within the proximal stomach.  Normal bowel gas pattern. Abdominal soft tissues are unremarkable. Lung bases are clear.  IMPRESSION: Orogastric tube curls within the proximal stomach.   Electronically Signed   By: Lajean Manes M.D.   On: 07/10/2013 18:57   CXR: no acute disease, ET tube high.  ASSESSMENT / PLAN:  PULMONARY A: Intubated for inability to protect airway.  Severe metabolic acidosis. P:   - Maintain on full vent support. - Adjust vent as able.  CARDIOVASCULAR A: Sedation related hypotension. P:  - Levophed at 6 mcg. - IVF for CVP. - Monitor CVP.  RENAL A:  ESRD, metabolic acidosis and hyperkalemia. P:   - Renal consult appreciated. - HD in AM.  GASTROINTESTINAL A:  GERD by history. P:   - Protonix. - Nutrition for TF.  HEMATOLOGIC A:  No active issues. P:  - Monitor for now.  INFECTIOUS A:  No active infection. P:   - Monitor fever curve and WBC. - Hold off abx for  now.  ENDOCRINE A:  No diabetes by history.   P:   - Monitor CBG.  NEUROLOGIC A:  Seizure, unknown if new. P:   - Pentobarb coma. - Fentanyl and pentobarb. - Keppra and dilantin. - Neuro consult appreciated.  TODAY'S SUMMARY: MRI today, no weaning, monitor neuro status.  I have personally obtained a history, examined the patient, evaluated laboratory and imaging results, formulated the assessment and plan and placed orders.  CRITICAL CARE: The patient is critically ill with multiple organ systems failure and requires high complexity decision making for assessment and support, frequent evaluation and titration of therapies, application of advanced monitoring technologies and extensive interpretation of multiple databases. Critical Care Time devoted to patient care services described in this note is 35 minutes.   Rush Farmer, M.D. [facility] Pulmonary/Critical Care Medicine. Pager: [phone]. After hours pager: [phone].

## 2013-07-11 NOTE — Progress Notes (Addendum)
NEURO HOSPITALIST PROGRESS NOTE   SUBJECTIVE:                                                                                                                        On pentobarbital, intubated on the vent. No further clinical seizures noted. Had dialysis yesterday and scheduled for dialysis again today. Reviewed C-EEG initially showed burst suppression pattern, no electrographic seizures, now with markedly suppressed EEG diffusely. Receiving dilantin 100 mg IV TID and Keppra IV 500 mg BID. 2/25 Dilantin level 9 post IV loading.   OBJECTIVE:                                                                                                                           Vital signs in last 24 hours: Temp:  [99.9 F (37.7 C)-103.1 F (39.5 C)] 100 F (37.8 C) (02/26 0500) Pulse Rate:  [80-130] 80 (02/26 0800) Resp:  [18-35] 18 (02/26 0800) BP: (101-175)/(66-100) 123/83 mmHg (02/26 0800) SpO2:  [97 %-100 %] 100 % (02/26 0800) FiO2 (%):  [30 %-40 %] 40 % (02/26 0800) Weight:  [73.6 kg (162 lb 4.1 oz)-76.6 kg (168 lb 14 oz)] 76.1 kg (167 lb 12.3 oz) (02/26 0500)  Intake/Output from previous day: 02/25 0701 - 02/26 0700 In: 1683.4 [I.V.:1179.4; NG/GT:189; IV Piggyback:315] Out: 3000  Intake/Output this shift: Total I/O In: 141.6 [I.V.:101.6; NG/GT:40] Out: -  Nutritional status: NPO  Past Medical History  Diagnosis Date  . ESRD (end stage renal disease) on dialysis   . Headache(784.0)   . Depression   . Anxiety     Neurologic Exam:  Patient on pentobarbital. MS: unresponsive but on pentobarbital. CN 2-12: pupils 2 mm, very sluggish reaction to light. No gaze preference. EOM present on Doll's maneuverwithout nystagmus. No corneal reflexes. Face appears to be symmetric. Motor: no spontaneous movements but on pentobarbital. Sensory: no reaction to pain but on pentobarbital. Coordination and gait: unable to test.  Lab Results: No results found  for this basename: cbc, bmp, coags, chol, tri, ldl, hga1c   Lipid Panel No results found for this basename: CHOL, TRIG, HDL, CHOLHDL, VLDL, LDLCALC,  in the last 72 hours  Studies/Results: Ct Head Wo Contrast  07/10/2013   CLINICAL DATA:  Severe headache.  Unresponsive.  EXAM: CT HEAD WITHOUT CONTRAST  TECHNIQUE: Contiguous axial images were obtained from the base of the skull through the vertex without intravenous contrast.  COMPARISON:  No comparison studies available.  FINDINGS: There is no evidence for acute hemorrhage, hydrocephalus, mass lesion, or abnormal extra-axial fluid collection. No definite CT evidence for acute infarction. Polypoid mucosal disease is seen in the right maxillary sinus. Remaining visualized paranasal sinuses are clear. No evidence for mastoid air cell fluid. A nasal tube is evident.  IMPRESSION: No acute intracranial abnormality.   Electronically Signed   By: Misty Stanley M.D.   On: 07/10/2013 07:31   Dg Chest Port 1 View  07/11/2013   CLINICAL DATA:  Intubation.  EXAM: PORTABLE CHEST - 1 VIEW  COMPARISON:  DG CHEST PORT 1VSAME DAY dated 07/10/2013  FINDINGS: Endotracheal tube, NG tube in stable position. Interim placement of left IJ line, its tip is projected over the superior vena cava. Stable cardiomegaly. Mild interstitial prominence particularly left lower lobe noted. Mild pneumonitis or interstitial pulmonary edema cannot be excluded. No pleural effusion or pneumothorax. No acute osseous abnormality.  IMPRESSION: 1. Endotracheal tube and NG tube in stable position. Interval placement of left IJ line, its tip is projected over superior vena cava. 2. Persistent cardiomegaly . 3. Interim increase in interstitial markings particularly in the left lower lobe. Interstitial pneumonitis and/or mild interstitial pulmonary edema could present in this fashion.   Electronically Signed   By: Marcello Moores  Register   On: 07/11/2013 08:02   Dg Chest Port 1 View  07/10/2013   CLINICAL  DATA:  Unresponsive patient.  Endotracheal tube placement.  EXAM: PORTABLE CHEST - 1 VIEW  COMPARISON:  None.  FINDINGS: Endotracheal tube tip is just below the thoracic inlet, 7.1 cm above the carina. Nasogastric tube extends as far as the gastroesophageal junction and quite likely into the stomach.  Tiny punctate densities scattered in the lungs are observed, suggesting small calcified nodules.  Heart size within normal limits for technique. The lungs appear otherwise clear.  IMPRESSION: 1. Multiple tiny dense nodules in the lungs probably represent calcified tiny nodules, in the 3 mm range. Most likely causes are remote varicella infection or old granulomatous disease, although there are post of different possibilities for this type of appearance, including hyperparathyroidism, metastatic disease from osteosarcoma, alveolar microlithiasis, and pulmonary hemosiderosis, among others.   Electronically Signed   By: Sherryl Barters M.D.   On: 07/10/2013 07:17   Dg Chest Port 1v Same Day  07/10/2013   CLINICAL DATA:  Central line placement  EXAM: PORTABLE CHEST - 1 VIEW SAME DAY  COMPARISON:  07/10/2013  FINDINGS: Left jugular catheter tip in the SVC.  No pneumothorax.  Endotracheal tube in good position.  NG tube enters the stomach.  The lungs are clear without infiltrate or effusion. Vascularity normal.  IMPRESSION: Satisfactory central line placement.   Electronically Signed   By: Franchot Gallo M.D.   On: 07/10/2013 09:09   Dg Abd Portable 1v  07/10/2013   CLINICAL DATA:  Orogastric tube placement.  EXAM: PORTABLE ABDOMEN - 1 VIEW  COMPARISON:  None.  FINDINGS: Orogastric tube extends below the diaphragm to curl within the proximal stomach.  Normal bowel gas pattern. Abdominal soft tissues are unremarkable. Lung bases are clear.  IMPRESSION: Orogastric tube curls within the proximal stomach.   Electronically Signed   By: Lajean Manes M.D.   On: 07/10/2013 18:57    MEDICATIONS  I have reviewed the patient's current medications.  ASSESSMENT/PLAN:                                                                                                           48 y/o with ESRD non compliant with dialysis, cocaine abuse, and refractory GTC SE, resolved. C-EEG currently showing complete background suppression, ? perhaps secondary to pentobarbital: will lower infusion to 1 mg/kg/hr and then stop pentobarbital if no recurrence of clinical or electrographic seizures. Will follow up.  Dorian Pod, MD [facility] [phone]  07/11/2013, 9:21 AM

## 2013-07-12 ENCOUNTER — Inpatient Hospital Stay (HOSPITAL_COMMUNITY): Payer: Medicare Other

## 2013-07-12 DIAGNOSIS — E43 Unspecified severe protein-calorie malnutrition: Secondary | ICD-10-CM | POA: Insufficient documentation

## 2013-07-12 LAB — GLUCOSE, CAPILLARY
GLUCOSE-CAPILLARY: 107 mg/dL — AB (ref 70–99)
GLUCOSE-CAPILLARY: 90 mg/dL (ref 70–99)
Glucose-Capillary: 89 mg/dL (ref 70–99)
Glucose-Capillary: 113 mg/dL — ABNORMAL HIGH (ref 70–99)
Glucose-Capillary: 126 mg/dL — ABNORMAL HIGH (ref 70–99)
Glucose-Capillary: 107 mg/dL — ABNORMAL HIGH (ref 70–99)

## 2013-07-12 LAB — CBC
HCT: 33.4 % — ABNORMAL LOW (ref 39.0–52.0)
Hemoglobin: 11.2 g/dL — ABNORMAL LOW (ref 13.0–17.0)
MCH: 31.1 pg (ref 26.0–34.0)
MCHC: 33.5 g/dL (ref 30.0–36.0)
MCV: 92.8 fL (ref 78.0–100.0)
Platelets: 83 10*3/uL — ABNORMAL LOW (ref 150–400)
RBC: 3.6 MIL/uL — ABNORMAL LOW (ref 4.22–5.81)
RDW: 15 % (ref 11.5–15.5)
WBC: 5.2 10*3/uL (ref 4.0–10.5)

## 2013-07-12 LAB — BASIC METABOLIC PANEL
BUN: 72 mg/dL — ABNORMAL HIGH (ref 6–23)
CALCIUM: 9.7 mg/dL (ref 8.4–10.5)
CO2: 22 mEq/L (ref 19–32)
Chloride: 95 mEq/L — ABNORMAL LOW (ref 96–112)
Creatinine, Ser: 15.28 mg/dL — ABNORMAL HIGH (ref 0.50–1.35)
GFR, EST AFRICAN AMERICAN: 4 mL/min — AB (ref >90)
GFR, EST NON AFRICAN AMERICAN: 3 mL/min — AB (ref >90)
GLUCOSE: 93 mg/dL (ref 70–99)
Potassium: 5.7 mEq/L — ABNORMAL HIGH (ref 3.7–5.3)
SODIUM: 139 meq/L (ref 137–147)

## 2013-07-12 LAB — POCT I-STAT 3, ART BLOOD GAS (G3+)
BICARBONATE: 24.5 meq/L — AB (ref 20.0–24.0)
O2 SAT: 96 %
TCO2: 26 mmol/L (ref 0–100)
pCO2 arterial: 37.6 mmHg (ref 35.0–45.0)
pH, Arterial: 7.422 (ref 7.350–7.450)
pO2, Arterial: 82 mmHg (ref 80.0–100.0)

## 2013-07-12 LAB — MAGNESIUM: Magnesium: 2.7 mg/dL — ABNORMAL HIGH (ref 1.5–2.5)

## 2013-07-12 LAB — PHOSPHORUS: PHOSPHORUS: 10.5 mg/dL — AB (ref 2.3–4.6)

## 2013-07-12 MED ORDER — AMLODIPINE BESYLATE 10 MG PO TABS
10.0000 mg | ORAL_TABLET | Freq: Every day | ORAL | Status: DC
  Administered 2013-07-12 – 2013-07-19 (×8): 10 mg via ORAL
  Filled 2013-07-12 (×8): qty 1

## 2013-07-12 MED ORDER — FENTANYL CITRATE 0.05 MG/ML IJ SOLN
25.0000 ug | INTRAMUSCULAR | Status: DC | PRN
  Administered 2013-07-12: 100 ug via INTRAVENOUS
  Administered 2013-07-12: 50 ug via INTRAVENOUS
  Administered 2013-07-12 – 2013-07-14 (×13): 100 ug via INTRAVENOUS
  Filled 2013-07-12 (×15): qty 2

## 2013-07-12 MED ORDER — SODIUM CHLORIDE 0.9 % IV SOLN
500.0000 mg | Freq: Once | INTRAVENOUS | Status: AC
  Administered 2013-07-12: 500 mg via INTRAVENOUS
  Filled 2013-07-12: qty 5

## 2013-07-12 MED ORDER — LOSARTAN POTASSIUM 50 MG PO TABS
100.0000 mg | ORAL_TABLET | Freq: Every day | ORAL | Status: DC
  Administered 2013-07-12 – 2013-07-14 (×3): 100 mg via ORAL
  Filled 2013-07-12 (×3): qty 2

## 2013-07-12 MED ORDER — PRO-STAT SUGAR FREE PO LIQD
30.0000 mL | Freq: Two times a day (BID) | ORAL | Status: DC
  Administered 2013-07-12 – 2013-07-13 (×3): 30 mL
  Filled 2013-07-12 (×5): qty 30

## 2013-07-12 MED ORDER — PROPOFOL 10 MG/ML IV EMUL
INTRAVENOUS | Status: AC
  Filled 2013-07-12: qty 100

## 2013-07-12 MED ORDER — CARVEDILOL 3.125 MG PO TABS
3.1250 mg | ORAL_TABLET | Freq: Two times a day (BID) | ORAL | Status: DC
  Administered 2013-07-12 – 2013-07-14 (×4): 3.125 mg via ORAL
  Filled 2013-07-12 (×9): qty 1

## 2013-07-12 MED ORDER — HYDRALAZINE HCL 20 MG/ML IJ SOLN
10.0000 mg | Freq: Four times a day (QID) | INTRAMUSCULAR | Status: DC | PRN
  Administered 2013-07-12 – 2013-07-16 (×8): 20 mg via INTRAVENOUS
  Filled 2013-07-12 (×10): qty 1

## 2013-07-12 MED ORDER — HYDRALAZINE HCL 20 MG/ML IJ SOLN
10.0000 mg | Freq: Four times a day (QID) | INTRAMUSCULAR | Status: DC | PRN
  Administered 2013-07-12: 10 mg via INTRAVENOUS
  Filled 2013-07-12: qty 1

## 2013-07-12 MED ORDER — NEPRO/CARBSTEADY PO LIQD
1000.0000 mL | ORAL | Status: DC
Start: 2013-07-12 — End: 2013-07-14
  Administered 2013-07-12 – 2013-07-13 (×2): 1000 mL
  Filled 2013-07-12 (×3): qty 1000

## 2013-07-12 MED ORDER — PROPOFOL 10 MG/ML IV EMUL
5.0000 ug/kg/min | INTRAVENOUS | Status: DC
  Administered 2013-07-12: 40 ug/kg/min via INTRAVENOUS
  Administered 2013-07-12: 5 ug/kg/min via INTRAVENOUS
  Administered 2013-07-12 – 2013-07-13 (×4): 50 ug/kg/min via INTRAVENOUS
  Administered 2013-07-13 (×2): 55 ug/kg/min via INTRAVENOUS
  Administered 2013-07-14: 60 ug/kg/min via INTRAVENOUS
  Administered 2013-07-14: 65 ug/kg/min via INTRAVENOUS
  Administered 2013-07-14: 60 ug/kg/min via INTRAVENOUS
  Filled 2013-07-12 (×10): qty 100

## 2013-07-12 NOTE — Progress Notes (Signed)
NEURO HOSPITALIST PROGRESS NOTE   SUBJECTIVE:                                                                                                                        Off sedation, intubated on the vent. Had an episode of seizure like activity last night and received extra dose 500 mg IV keppra. No new neurological developments last night. MRI brain showed findings more consistent with PRES. On keppra 500 mg BID. Schedule for dialysis today. EEG today showed diffuse slowing but no evidence of electrographic seizures.   OBJECTIVE:                                                                                                                           Vital signs in last 24 hours: Temp:  [94.5 F (34.7 C)-100.1 F (37.8 C)] 100.1 F (37.8 C) (02/27 0800) Pulse Rate:  [63-116] 115 (02/27 1100) Resp:  [18-20] 20 (02/27 1100) BP: (99-153)/(65-103) 153/101 mmHg (02/27 1100) SpO2:  [90 %-100 %] 98 % (02/27 1100) FiO2 (%):  [30 %-40 %] 30 % (02/27 1100) Weight:  [76.8 kg (169 lb 5 oz)] 76.8 kg (169 lb 5 oz) (02/27 0457)  Intake/Output from previous day: 02/26 0701 - 02/27 0700 In: 1385.7 [I.V.:435.7; NG/GT:650; IV Piggyback:300] Out: -  Intake/Output this shift: Total I/O In: 170 [I.V.:10; NG/GT:160] Out: -  Nutritional status: NPO  Past Medical History  Diagnosis Date  . ESRD (end stage renal disease) on dialysis   . Headache(784.0)   . Depression   . Anxiety    Neurologic Exam:  MS: unresponsive  CN 2-12: pupils 2 mm, very sluggish reaction to light. No gaze preference. EOM present on Doll's maneuver without nystagmus. No corneal reflexes. Face appears to be symmetric.  Motor: no spontaneous movements but on pentobarbital.  Sensory: no reaction to pain but on pentobarbital.  Coordination and gait: unable to test.   Lab Results: No results found for this basename: cbc, bmp, coags, chol, tri, ldl, hga1c   Lipid Panel No results found  for this basename: CHOL, TRIG, HDL, CHOLHDL, VLDL, LDLCALC,  in the last 72 hours  Studies/Results: Ct Head Wo Contrast  07/11/2013   CLINICAL DATA:  History of end-stage renal  disease and cocaine abuse. Found down. Seizure. Abnormal pupils.  EXAM: CT HEAD WITHOUT CONTRAST  TECHNIQUE: Contiguous axial images were obtained from the base of the skull through the vertex without intravenous contrast.  COMPARISON:  CT HEAD W/O CM dated 07/10/2013  FINDINGS: Sinuses/Soft tissues: Possible right supraorbital scalp soft tissue swelling. Example image 41/series 3. Endotracheal tube. Mucous retention cyst or polyp in the right maxillary sinus. Trace mucosal thickening of bilateral maxillary sinuses and ethmoid air cells.  Intracranial: No mass lesion, hemorrhage, hydrocephalus, acute infarct, intra-axial, or extra-axial fluid collection.  IMPRESSION: 1.  No acute intracranial abnormality. 2. Sinus disease. 3. Possible right supraorbital scalp soft tissue swelling, mild.   Electronically Signed   By: Abigail Miyamoto M.D.   On: 07/11/2013 13:19   Mr Brain Wo Contrast  07/11/2013   ADDENDUM REPORT: 07/11/2013 18:03  ADDENDUM: Findings discussed with and reconfirmed by Dr.Gaylon Melchor on 07/11/2013 5:50 PM.   Electronically Signed   By: Elon Alas   On: 07/11/2013 18:03   07/11/2013   CLINICAL DATA:  Status epilepticus, history of end-stage renal disease and cocaine abuse.  EXAM: MRI HEAD WITHOUT CONTRAST  TECHNIQUE: Multiplanar, multiecho pulse sequences of the brain and surrounding structures were obtained without intravenous contrast.  COMPARISON:  CT HEAD W/O CM dated 07/11/2013  FINDINGS: The ventricles and sulci are normal for patient's age. Abnormal mildly expansile FLAIR T2 hyperintense signal within the bilateral occipital lobes, to a lesser extent parietal lobes, and right frontal lobe, predominately cortical distribution. However there is a subcentimeter T2 hyperintensity within the splenium of the right  corpus callosum as well as additional subcentimeter FLAIR T2 hyperintensities in the supratentorial and right pons white matter. Minimal associated reduced diffusion, and low ADC values. No midline shift. No susceptibility artifact to suggest hemorrhage.  No abnormal extra-axial fluid collections. No extra-axial masses though, contrast enhanced sequences would be more sensitive. Normal major intracranial vascular flow voids seen at the skull base.  Ocular globes and orbital contents are unremarkable though not tailored for evaluation. No abnormal sellar expansion.  Life-support lines in place with mild paranasal sinus mucosal thickening, trace air-fluid level in the left maxillary sinus. Trace fluid signal seen in the right mastoid tip. Small right frontal scalp hematoma. No abnormal sellar expansion. No cerebellar tonsillar ectopia.  IMPRESSION: Abnormal patchy predominantly cortical signal within the parietoccipital lobes, right posterior frontal lobe. Additional patchy abnormal FLAIR T2 hyperintense signal seen, including the splenium of the corpus callosum. Constellation of findings can be seen with PRES (posterior reversible encephalopathic syndrome), though superimposed postictal MR findings may have this appearance, less likely.  White matter changes may reflect chronic small vessel ischemic disease, though may be part of the acute process.  Electronically Signed: By: Elon Alas On: 07/11/2013 17:52   Dg Chest Port 1 View  07/12/2013   CLINICAL DATA:  Intubation.  EXAM: PORTABLE CHEST - 1 VIEW  COMPARISON:  DG CHEST 1V PORT dated 07/11/2013  FINDINGS: Endotracheal tube, left IJ line, and NG tube in stable position. Persistent cardiomegaly with mild pulmonary vascular prominence. Developing pulmonary alveolar and interstitial infiltrates are present particularly in the right lung base on today's exam. Small right pleural effusion cannot be excluded. These findings suggest congestive heart failure with  pulmonary edema. Superimposed pneumonia cannot be excluded . No pneumothorax. No acute osseous abnormality.  IMPRESSION: 1. Stable line and tube positions. 2. Developing congestive heart failure with pulmonary edema. Superimposed pneumonia particularly in the right lung base cannot be  excluded.   Electronically Signed   By: [place]   On: 07/12/2013 07:39   Dg Chest Port 1 View  07/11/2013   CLINICAL DATA:  Intubation.  EXAM: PORTABLE CHEST - 1 VIEW  COMPARISON:  DG CHEST PORT 1VSAME DAY dated 07/10/2013  FINDINGS: Endotracheal tube, NG tube in stable position. Interim placement of left IJ line, its tip is projected over the superior vena cava. Stable cardiomegaly. Mild interstitial prominence particularly left lower lobe noted. Mild pneumonitis or interstitial pulmonary edema cannot be excluded. No pleural effusion or pneumothorax. No acute osseous abnormality.  IMPRESSION: 1. Endotracheal tube and NG tube in stable position. Interval placement of left IJ line, its tip is projected over superior vena cava. 2. Persistent cardiomegaly . 3. Interim increase in interstitial markings particularly in the left lower lobe. Interstitial pneumonitis and/or mild interstitial pulmonary edema could present in this fashion.   Electronically Signed   By: Marcello Moores  Register   On: 07/11/2013 08:02   Dg Abd Portable 1v  07/10/2013   CLINICAL DATA:  Orogastric tube placement.  EXAM: PORTABLE ABDOMEN - 1 VIEW  COMPARISON:  None.  FINDINGS: Orogastric tube extends below the diaphragm to curl within the proximal stomach.  Normal bowel gas pattern. Abdominal soft tissues are unremarkable. Lung bases are clear.  IMPRESSION: Orogastric tube curls within the proximal stomach.   Electronically Signed   By: Lajean Manes M.D.   On: 07/10/2013 18:57    MEDICATIONS                                                                                                                       I have reviewed the patient's current  medications.  ASSESSMENT/PLAN:                                                                                                           PRES with resolved refractory SE. Will give 500 mg IV keppra after dialysis and maintain daily keppra 500 mg BID. Continue dilantin. BP goal 120-140. Will follow up.  Dorian Pod, MD [facility] [phone]  07/12/2013, 11:17 AM

## 2013-07-12 NOTE — Progress Notes (Signed)
Pulmonary/Critical Care Progress Note   Name: Jeremy Mccoy MRN: [id number] DOB: 05/31/1965    ADMISSION DATE:  07/10/2013 CONSULTATION DATE:  07/10/2013  REFERRING MD :  Dr. Zenia Resides, ED. PRIMARY SERVICE: PCCM  CHIEF COMPLAINT:  Seizure and AMS  BRIEF PATIENT DESCRIPTION: 48 year old male with history of ESRD and cocaine abuse who was found down by family on 2/25 seizing.  EMS was called and patient was brought to the ED.  Was noted that he was not protecting his airway and was intubated.  PCCM was called to admit.  Patient is actively seizing post intubation and after paralytics were off.  SIGNIFICANT EVENTS / STUDIES:  2/25 seizure intubated  LINES / TUBES: PIV ET tube 2/25>>> L IJ TLC 2/25>>> R radial a-line 2/25>>>  CULTURES: Blood 2/25>>> Urine 2/25>>> Sputum 2/25>>>  ANTIBIOTICS: Unasyn 2/25>>>  SUBJECTIVE: Intubated but no longer on sedation, pentobarb off.  VITAL SIGNS: Temp:  [94.5 F (34.7 C)-98.5 F (36.9 C)] 98.5 F (36.9 C) (02/27 0331) Pulse Rate:  [63-111] 111 (02/27 0800) Resp:  [18-20] 18 (02/27 0800) BP: (99-153)/(65-103) 138/92 mmHg (02/27 0800) SpO2:  [90 %-100 %] 97 % (02/27 0800) FiO2 (%):  [30 %-40 %] 30 % (02/27 0800) Weight:  [76.8 kg (169 lb 5 oz)] 76.8 kg (169 lb 5 oz) (02/27 0457)  HEMODYNAMICS: CVP:  [3 mmHg-8 mmHg] 5 mmHg  VENTILATOR SETTINGS: Vent Mode:  [-] PRVC FiO2 (%):  [30 %-40 %] 30 % Set Rate:  [18 bmp] 18 bmp Vt Set:  [620 mL] 620 mL PEEP:  [5 cmH20] 5 cmH20 Plateau Pressure:  [16 cmH20-18 cmH20] 17 cmH20  INTAKE / OUTPUT: Intake/Output     02/26 0701 - 02/27 0700 02/27 0701 - 02/28 0700   I.V. (mL/kg) 435.7 (5.7)    NG/GT 650 40   IV Piggyback 300    Total Intake(mL/kg) 1385.7 (18) 40 (0.5)   Other     Total Output       Net +1385.7 +40        Stool Occurrence 1 x     PHYSICAL EXAMINATION: General:  Chronically ill appearing male, no longer seizing. Neuro: Sedated, intubate, does not withdraw to  pain.Marland Kitchen HEENT:  [place]/AT, PERRL, EOM-I and bloody mucous membranes. Cardiovascular:  Regular and tachy, Nl S1/S2, -M/R/G. Lungs:  CTA bilaterally. Abdomen:  Soft, NT, ND and +BS. Musculoskeletal:  Active seizure. Skin:  Intact.  LABS:  CBC  Recent Labs Lab 07/10/13 0945 07/11/13 0500 07/12/13 0340  WBC 13.3* 7.6 5.2  HGB 12.3* 9.6* 11.2*  HCT 36.1* 29.3* 33.4*  PLT 131* 102* 83*   Coag's  Recent Labs Lab 07/10/13 0659 07/10/13 0945  APTT  --  29  INR 1.26 1.12   BMET  Recent Labs Lab 07/10/13 0945 07/11/13 0500 07/12/13 0340  NA 134* 142 139  K 7.6* 4.8 5.7*  CL 83* 102 95*  CO2 18* 23 22  BUN 109* 50* 72*  CREATININE 22.78* 12.17* 15.28*  GLUCOSE 142* 110* 93   Electrolytes  Recent Labs Lab 07/10/13 0945 07/11/13 0500 07/12/13 0340  CALCIUM 9.9 8.3* 9.7  MG  --  2.4 2.7*  PHOS  --  9.6* 10.5*   Sepsis Markers  Recent Labs Lab 07/10/13 0659 07/10/13 0721 07/10/13 0945  LATICACIDVEN 22.5* >17.00* 3.1*  PROCALCITON 0.63  --   --    ABG  Recent Labs Lab 07/10/13 1717 07/11/13 0404 07/12/13 0508  PHART 7.603* 7.414 7.422  PCO2ART 30.0* 40.2 37.6  PO2ART 83.0 135.0* 82.0   Liver Enzymes  Recent Labs Lab 07/10/13 0659 07/10/13 0945  AST 23 24  ALT 15 15  ALKPHOS 67 57  BILITOT 0.3 0.3  ALBUMIN 4.9 4.5   Cardiac Enzymes  Recent Labs Lab 07/10/13 0659  PROBNP 29037.0*   Glucose  Recent Labs Lab 07/11/13 1214 07/11/13 1718 07/11/13 2009 07/11/13 2353 07/12/13 0329 07/12/13 0752  GLUCAP 96 82 80 119* 89 107*   Imaging Ct Head Wo Contrast  07/11/2013   CLINICAL DATA:  History of end-stage renal disease and cocaine abuse. Found down. Seizure. Abnormal pupils.  EXAM: CT HEAD WITHOUT CONTRAST  TECHNIQUE: Contiguous axial images were obtained from the base of the skull through the vertex without intravenous contrast.  COMPARISON:  CT HEAD W/O CM dated 07/10/2013  FINDINGS: Sinuses/Soft tissues: Possible right supraorbital  scalp soft tissue swelling. Example image 41/series 3. Endotracheal tube. Mucous retention cyst or polyp in the right maxillary sinus. Trace mucosal thickening of bilateral maxillary sinuses and ethmoid air cells.  Intracranial: No mass lesion, hemorrhage, hydrocephalus, acute infarct, intra-axial, or extra-axial fluid collection.  IMPRESSION: 1.  No acute intracranial abnormality. 2. Sinus disease. 3. Possible right supraorbital scalp soft tissue swelling, mild.   Electronically Signed   By: Abigail Miyamoto M.D.   On: 07/11/2013 13:19   Mr Brain Wo Contrast  07/11/2013   ADDENDUM REPORT: 07/11/2013 18:03  ADDENDUM: Findings discussed with and reconfirmed by Dr.OSVALDO CAMILO on 07/11/2013 5:50 PM.   Electronically Signed   By: Elon Alas   On: 07/11/2013 18:03   07/11/2013   CLINICAL DATA:  Status epilepticus, history of end-stage renal disease and cocaine abuse.  EXAM: MRI HEAD WITHOUT CONTRAST  TECHNIQUE: Multiplanar, multiecho pulse sequences of the brain and surrounding structures were obtained without intravenous contrast.  COMPARISON:  CT HEAD W/O CM dated 07/11/2013  FINDINGS: The ventricles and sulci are normal for patient's age. Abnormal mildly expansile FLAIR T2 hyperintense signal within the bilateral occipital lobes, to a lesser extent parietal lobes, and right frontal lobe, predominately cortical distribution. However there is a subcentimeter T2 hyperintensity within the splenium of the right corpus callosum as well as additional subcentimeter FLAIR T2 hyperintensities in the supratentorial and right pons white matter. Minimal associated reduced diffusion, and low ADC values. No midline shift. No susceptibility artifact to suggest hemorrhage.  No abnormal extra-axial fluid collections. No extra-axial masses though, contrast enhanced sequences would be more sensitive. Normal major intracranial vascular flow voids seen at the skull base.  Ocular globes and orbital contents are unremarkable though  not tailored for evaluation. No abnormal sellar expansion.  Life-support lines in place with mild paranasal sinus mucosal thickening, trace air-fluid level in the left maxillary sinus. Trace fluid signal seen in the right mastoid tip. Small right frontal scalp hematoma. No abnormal sellar expansion. No cerebellar tonsillar ectopia.  IMPRESSION: Abnormal patchy predominantly cortical signal within the parietoccipital lobes, right posterior frontal lobe. Additional patchy abnormal FLAIR T2 hyperintense signal seen, including the splenium of the corpus callosum. Constellation of findings can be seen with PRES (posterior reversible encephalopathic syndrome), though superimposed postictal MR findings may have this appearance, less likely.  White matter changes may reflect chronic small vessel ischemic disease, though may be part of the acute process.  Electronically Signed: By: Elon Alas On: 07/11/2013 17:52   Dg Chest Port 1 View  07/12/2013   CLINICAL DATA:  Intubation.  EXAM: PORTABLE CHEST - 1 VIEW  COMPARISON:  DG CHEST 1V  PORT dated 07/11/2013  FINDINGS: Endotracheal tube, left IJ line, and NG tube in stable position. Persistent cardiomegaly with mild pulmonary vascular prominence. Developing pulmonary alveolar and interstitial infiltrates are present particularly in the right lung base on today's exam. Small right pleural effusion cannot be excluded. These findings suggest congestive heart failure with pulmonary edema. Superimposed pneumonia cannot be excluded . No pneumothorax. No acute osseous abnormality.  IMPRESSION: 1. Stable line and tube positions. 2. Developing congestive heart failure with pulmonary edema. Superimposed pneumonia particularly in the right lung base cannot be excluded.   Electronically Signed   By: [place]   On: 07/12/2013 07:39   Dg Chest Port 1 View  07/11/2013   CLINICAL DATA:  Intubation.  EXAM: PORTABLE CHEST - 1 VIEW  COMPARISON:  DG CHEST PORT 1VSAME DAY dated  07/10/2013  FINDINGS: Endotracheal tube, NG tube in stable position. Interim placement of left IJ line, its tip is projected over the superior vena cava. Stable cardiomegaly. Mild interstitial prominence particularly left lower lobe noted. Mild pneumonitis or interstitial pulmonary edema cannot be excluded. No pleural effusion or pneumothorax. No acute osseous abnormality.  IMPRESSION: 1. Endotracheal tube and NG tube in stable position. Interval placement of left IJ line, its tip is projected over superior vena cava. 2. Persistent cardiomegaly . 3. Interim increase in interstitial markings particularly in the left lower lobe. Interstitial pneumonitis and/or mild interstitial pulmonary edema could present in this fashion.   Electronically Signed   By: Marcello Moores  Register   On: 07/11/2013 08:02   Dg Abd Portable 1v  07/10/2013   CLINICAL DATA:  Orogastric tube placement.  EXAM: PORTABLE ABDOMEN - 1 VIEW  COMPARISON:  None.  FINDINGS: Orogastric tube extends below the diaphragm to curl within the proximal stomach.  Normal bowel gas pattern. Abdominal soft tissues are unremarkable. Lung bases are clear.  IMPRESSION: Orogastric tube curls within the proximal stomach.   Electronically Signed   By: Lajean Manes M.D.   On: 07/10/2013 18:57   CXR: no acute disease, ET tube high.  ASSESSMENT / PLAN:  PULMONARY A: Intubated for inability to protect airway.  See neuro section. P:   - Maintain on full vent support. - Adjust vent as needed. - Hold weaning for today.  CARDIOVASCULAR A: Sedation related hypotension, resolved after d/c of pento-barb now hypertensive. P:  - Off levophed, maintain SBP 120-140, will start coreg. - See neuro section. - KVO IVF. - Monitor CVP.  RENAL A:  ESRD, metabolic acidosis (resolved with HD and control of seizure) and hyperkalemia. P:   - Renal consult appreciated. - HD today.  GASTROINTESTINAL A:  GERD by history. P:   - Protonix. - Nutrition for  TF.  HEMATOLOGIC A:  No active issues. P:  - Monitor for now.  INFECTIOUS A:  No active infection. P:   - Monitor fever curve and WBC. - Hold off abx for now.  ENDOCRINE A:  No diabetes by history.   P:   - Monitor CBG.  NEUROLOGIC A:  Seizure, unknown if new.  MRI consistent with press, seizure activity now controlled. P:   - D/C pento-barb coma. - D/C fentanyl drip. - Keppra and dilantin. - Neuro consult appreciated.  TODAY'S SUMMARY: MRI consistent with PRES.  Pento-barb stopped.  Change fentanyl to PRN from drip.  HD today.  Add coreg for BP control with target of 120-140.  I have personally obtained a history, examined the patient, evaluated laboratory and imaging results, formulated the assessment  and plan and placed orders.  CRITICAL CARE: The patient is critically ill with multiple organ systems failure and requires high complexity decision making for assessment and support, frequent evaluation and titration of therapies, application of advanced monitoring technologies and extensive interpretation of multiple databases. Critical Care Time devoted to patient care services described in this note is 35 minutes.   Rush Farmer, M.D. [facility]/Critical Care Medicine. Pager: [phone]. After hours pager: [phone].

## 2013-07-12 NOTE — Progress Notes (Signed)
No SBT/wean done due to pt not following commands. Pt is tolerating full support well with no complications noted. RT will monitor.

## 2013-07-12 NOTE — Progress Notes (Signed)
eLink Physician-Brief Progress Note Patient Name: Ajee Cumpston DOB: Jan 19, 1966 MRN: [id number]  Date of Service  07/12/2013   HPI/Events of Note   Hypertensive, started on cardene gtt  eICU Interventions  Resume cozaar & amlodipin Try to restrict cardene (too much volume)   Intervention Category Intermediate Interventions: Hypertension - evaluation and management  Inette Doubrava V. 07/12/2013, 3:59 PM

## 2013-07-12 NOTE — Progress Notes (Signed)
Bedside routine adult EEG completed.

## 2013-07-12 NOTE — Progress Notes (Signed)
Per MD fentanyl drip was dc'd. Wasted 250cc bag of fentanyl in sink. Was witnessd by Caprice Beaver RN

## 2013-07-12 NOTE — Progress Notes (Signed)
During neuro assessment pt began to have a generalized tremor to stimulation. HR, BP, and RR increased slightly. Tremor stops when stimulation ceases. Elink notified via elink button to camera in. Fentanyl turned back on at 50 mcg.

## 2013-07-12 NOTE — Progress Notes (Signed)
0255 hit Elink button because pt was exhibiting seizure like activity lasting less than a minute. Dr. Jimmy Footman requested I call neurology. 0257 Paged Dr. Doy Mince. [place] Dr. Doy Mince returned my page. A stat dose of Keppra ordered.

## 2013-07-12 NOTE — Progress Notes (Signed)
NUTRITION FOLLOW UP DOCUMENTATION CODES  Per approved criteria  -Severe malnutrition in the context of chronic illness   Patient continues to meet criteria for severe malnutrition in the context of chronic illness as evidenced by severe body fat and muscle mass depletion.  Intervention:    Decrease Nepro/Carb Steady to 40 ml/hr  TF provides 1728 kcal, 78 grams protein, and 698 ml free water  Add Prostat 30 ml BID  Above TF regimen plus Prostat and Propofol provides:  1989 kcal (95% estimated needs), 108 grams protein (100% estimated needs), and 698 ml free water.   Nutrition Dx:   Inadequate oral intake related to inability to eat as evidenced by NPO status, ongoing  Goal:   Patient to meet >/= 90% of estimated nutrition needs  Monitor:   Vent status, TF tolerance/adequacy, weight trends, labs  Assessment:   48 year old male with history of ESRD and cocaine abuse who was found down by family on 2/25 seizing. EMS was called and patient was brought to the ED. Was noted that he was not protecting his airway and was intubated. PCCM was called to admit. Patient is actively seizing post intubation and after paralytics were off. Per conversation with nurse, patient is likely an active cocaine and ETOH abuser.   Per conversation with nurse, patient is to remain on ventilator support. Nurse was administering propofol upon dietetic intern visit. Per nurse, propofol will start at a low rate, but will possibly need to be increased.   Patient is currently intubated on ventilator support. MV: 11.9 L/min Temp (24hrs), Avg:98.2 F (36.8 C), Min:94.5 F (34.7 C), Max:100.3 F (37.9 C) Propofol:  2.3 ml/hr providing 61 kcal daily  Potassium elevated at 5.7, magnesium elevated at 2.7, phosphorus elevated at 10.5.  Height: Ht Readings from Last 1 Encounters:  07/10/13 6' (1.829 m)    Weight Status:   Wt Readings from Last 1 Encounters:  07/12/13 166 lb 10.7 oz (75.6 kg)  admit weight 168  lb on 07/10/13  Re-estimated needs:  Kcal: 2091 Protein: 107-125 grams Fluid: >/=1.2 L/day  Skin: no wounds  Diet Order: NPO   Intake/Output Summary (Last 24 hours) at 07/12/13 1338 Last data filed at 07/12/13 1300  Gross per 24 hour  Intake 1135.43 ml  Output      0 ml  Net 1135.43 ml    Last BM: 2/26   Labs:   Recent Labs Lab 07/10/13 0945 07/11/13 0500 07/12/13 0340  NA 134* 142 139  K 7.6* 4.8 5.7*  CL 83* 102 95*  CO2 18* 23 22  BUN 109* 50* 72*  CREATININE 22.78* 12.17* 15.28*  CALCIUM 9.9 8.3* 9.7  MG  --  2.4 2.7*  PHOS  --  9.6* 10.5*  GLUCOSE 142* 110* 93    CBG (last 3)   Recent Labs  07/12/13 0329 07/12/13 0752 07/12/13 1142  GLUCAP 89 107* 113*    Scheduled Meds: . ampicillin-sulbactam (UNASYN) IV  3 g Intravenous Q24H  . antiseptic oral rinse  15 mL Mouth Rinse QID  . carvedilol  3.125 mg Oral BID WC  . chlorhexidine  15 mL Mouth Rinse BID  . feeding supplement (NEPRO CARB STEADY)  1,000 mL Per Tube Q24H  . levETIRAcetam  500 mg Intravenous Q12H  . pantoprazole (PROTONIX) IV  40 mg Intravenous Q24H  . phenytoin (DILANTIN) IV  100 mg Intravenous 3 times per day    Continuous Infusions: . niCARDipine Stopped (07/10/13 1015)  . norepinephrine (LEVOPHED)  Adult infusion Stopped (07/12/13 0230)  . PENTobarbital (NEMBUTAL) infusion Stopped (07/11/13 1000)    Claudell Kyle, Dietetic Intern Pager: [phone]    Intern note/chart reviewed. Revisions made.  [place], [place], [place] Pager [phone] After Hours Pager

## 2013-07-12 NOTE — Progress Notes (Addendum)
  [facility] Progress Note   Subjective: Pentobarb gtt stopped 24 hours ago and fentanyl drip stopped this am.  Off levophed, BP coming up , CVP up to 5-7.  Not responding.  MRI showed changes c/w PRES syndrome.   Filed Vitals:   07/12/13 0800 07/12/13 0900 07/12/13 1000 07/12/13 1100  BP: 138/92 144/96 151/98 153/101  Pulse: 111 116 116 115  Temp: 100.1 F (37.8 C)     TempSrc: Oral     Resp: 18 18 20 20   Height:      Weight:      SpO2: 97% 96% 97% 98%  Exam: On vent, unresponsive  ETT in place  No jvd  Chest is clear bilat  RRR no MRG  Abd scaphoid, nd, +bs  Ext trace edema UE's, no LE edema L forearm AVF patent Neuro- on vent, no response  CXR- no sig dz  Dialysis: TTS [facility]  4hr 22min 71kg 1K/2.5 [place] Heparin none LFA AVF  Hectorol 4  Labs: tsat 27% pth 369   Assessment:  1 Seizures / status epilepticus- off of pentobarbital, on Keppra and Dilantin IV, neurology following 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 HD again tomorrow to get back on schedule and to treat probable component of uremia from underdialysis (noncompliance w HD).    Kelly Splinter MD  pager [phone]    cell [phone]  07/12/2013, 11:26 AM     Recent Labs Lab 07/10/13 0945 07/11/13 0500 07/12/13 0340  NA 134* 142 139  K 7.6* 4.8 5.7*  CL 83* 102 95*  CO2 18* 23 22  GLUCOSE 142* 110* 93  BUN 109* 50* 72*  CREATININE 22.78* 12.17* 15.28*  CALCIUM 9.9 8.3* 9.7  PHOS  --  9.6* 10.5*    Recent Labs Lab 07/10/13 0659 07/10/13 0945  AST 23 24  ALT 15  15  ALKPHOS 67 57  BILITOT 0.3 0.3  PROT 8.8* 7.7  ALBUMIN 4.9 4.5    Recent Labs Lab 07/10/13 0659 07/10/13 0945 07/11/13 0500 07/12/13 0340  WBC 17.4* 13.3* 7.6 5.2  NEUTROABS 15.8* 12.1*  --   --   HGB 13.3 12.3* 9.6* 11.2*  HCT 39.9 36.1* 29.3* 33.4*  MCV 95.0 90.0 93.0 92.8  PLT 179 131* 102* 83*   . ampicillin-sulbactam (UNASYN) IV  3 g Intravenous Q24H  . antiseptic oral rinse  15 mL Mouth Rinse QID  . carvedilol  3.125 mg Oral BID WC  . chlorhexidine  15 mL Mouth Rinse BID  . feeding supplement (NEPRO CARB STEADY)  1,000 mL Per Tube Q24H  . levETIRAcetam  500 mg Intravenous Q12H  . pantoprazole (PROTONIX) IV  40 mg Intravenous Q24H  . phenytoin (DILANTIN) IV  100 mg Intravenous 3 times per day   . niCARDipine Stopped (07/10/13 1015)  . norepinephrine (LEVOPHED) Adult infusion Stopped (07/12/13 0230)  . PENTobarbital (NEMBUTAL) infusion Stopped (07/11/13 1000)   acetaminophen (TYLENOL) oral liquid 160 mg/5 mL, fentaNYL

## 2013-07-13 ENCOUNTER — Inpatient Hospital Stay (HOSPITAL_COMMUNITY): Payer: Medicare Other

## 2013-07-13 LAB — GLUCOSE, CAPILLARY
GLUCOSE-CAPILLARY: 86 mg/dL (ref 70–99)
GLUCOSE-CAPILLARY: 104 mg/dL — AB (ref 70–99)
GLUCOSE-CAPILLARY: 85 mg/dL (ref 70–99)
Glucose-Capillary: 87 mg/dL (ref 70–99)
Glucose-Capillary: 104 mg/dL — ABNORMAL HIGH (ref 70–99)

## 2013-07-13 LAB — CBC
HEMATOCRIT: 35.6 % — AB (ref 39.0–52.0)
HEMOGLOBIN: 11.7 g/dL — AB (ref 13.0–17.0)
MCH: 30.8 pg (ref 26.0–34.0)
MCHC: 32.9 g/dL (ref 30.0–36.0)
MCV: 93.7 fL (ref 78.0–100.0)
Platelets: 111 10*3/uL — ABNORMAL LOW (ref 150–400)
RBC: 3.8 MIL/uL — ABNORMAL LOW (ref 4.22–5.81)
RDW: 15.2 % (ref 11.5–15.5)
WBC: 6.3 10*3/uL (ref 4.0–10.5)

## 2013-07-13 LAB — RENAL FUNCTION PANEL
ALBUMIN: 3.3 g/dL — AB (ref 3.5–5.2)
BUN: 45 mg/dL — AB (ref 6–23)
CO2: 26 mEq/L (ref 19–32)
CREATININE: 10.08 mg/dL — AB (ref 0.50–1.35)
Calcium: 10.1 mg/dL (ref 8.4–10.5)
Chloride: 92 mEq/L — ABNORMAL LOW (ref 96–112)
GFR calc Af Amer: 6 mL/min — ABNORMAL LOW (ref >90)
GFR calc non Af Amer: 5 mL/min — ABNORMAL LOW (ref >90)
GLUCOSE: 107 mg/dL — AB (ref 70–99)
PHOSPHORUS: 7.3 mg/dL — AB (ref 2.3–4.6)
Potassium: 4.1 mEq/L (ref 3.7–5.3)
Sodium: 138 mEq/L (ref 137–147)

## 2013-07-13 LAB — BLOOD GAS, ARTERIAL
ACID-BASE EXCESS: 5.2 mmol/L — AB (ref 0.0–2.0)
Bicarbonate: 28.6 mEq/L — ABNORMAL HIGH (ref 20.0–24.0)
DRAWN BY: 347641
FIO2: 30 %
LHR: 18 {breaths}/min
MECHVT: 620 mL
O2 Saturation: 98.6 %
PCO2 ART: 38.5 mmHg (ref 35.0–45.0)
PEEP: 5 cmH2O
Patient temperature: 99.2
TCO2: 29.8 mmol/L (ref 0–100)
pH, Arterial: 7.486 — ABNORMAL HIGH (ref 7.350–7.450)
pO2, Arterial: 108 mmHg — ABNORMAL HIGH (ref 80.0–100.0)

## 2013-07-13 LAB — MAGNESIUM: Magnesium: 2.6 mg/dL — ABNORMAL HIGH (ref 1.5–2.5)

## 2013-07-13 LAB — CULTURE, RESPIRATORY W GRAM STAIN

## 2013-07-13 LAB — CULTURE, RESPIRATORY

## 2013-07-13 MED ORDER — DEXMEDETOMIDINE HCL IN NACL 200 MCG/50ML IV SOLN
0.4000 ug/kg/h | INTRAVENOUS | Status: DC
  Administered 2013-07-13: 0.4 ug/kg/h via INTRAVENOUS
  Filled 2013-07-13: qty 50

## 2013-07-13 MED ORDER — PANTOPRAZOLE SODIUM 40 MG PO PACK
40.0000 mg | PACK | Freq: Every day | ORAL | Status: DC
  Filled 2013-07-13 (×3): qty 20

## 2013-07-13 MED ORDER — SODIUM CHLORIDE 0.9 % IV SOLN
500.0000 mg | Freq: Once | INTRAVENOUS | Status: AC
  Administered 2013-07-13: 500 mg via INTRAVENOUS
  Filled 2013-07-13: qty 5

## 2013-07-13 MED ORDER — CEFTRIAXONE SODIUM 1 G IJ SOLR
1.0000 g | INTRAMUSCULAR | Status: DC
  Administered 2013-07-13 – 2013-07-14 (×2): 1 g via INTRAVENOUS
  Filled 2013-07-13 (×3): qty 10

## 2013-07-13 NOTE — Progress Notes (Signed)
Pulmonary/Critical Care Progress Note   Name: Jeremy Mccoy MRN: [id number] DOB: 24-Jul-1965    ADMISSION DATE:  07/10/2013 CONSULTATION DATE:  07/10/2013  REFERRING MD :  Dr. Zenia Resides, ED. PRIMARY SERVICE: PCCM  CHIEF COMPLAINT:  Seizure and AMS  BRIEF PATIENT DESCRIPTION: 48 year old male with history of ESRD and cocaine abuse who was found down by family on 2/25 seizing.  EMS was called and patient was brought to the ED.  Was noted that he was not protecting his airway and was intubated.  PCCM was called to admit.  Patient is actively seizing post intubation and after paralytics were off.  SIGNIFICANT EVENTS / STUDIES:  2/25 seizure intubated July 15, 2022 off pentobarb and on diprivan. LINES / TUBES: PIV ET tube 2/25>>> L IJ TLC 2/25>>> R radial a-line 2/25>>>  CULTURES: Blood 2/25>>>ng Urine 2/25>>> Sputum 2/25>>>klebs int to unasyn, S to ceftx  ANTIBIOTICS: Unasyn 2/25>>>2/28 ceftx 07/15/22 >>  SUBJECTIVE: Intubated  On diprivan for  sedation, pentobarb off since 2/26. Agitated & pulling at lines  VITAL SIGNS: Temp:  [98.1 F (36.7 C)-100.3 F (37.9 C)] 98.2 F (36.8 C) 07/15/2022 0747) Pulse Rate:  [79-139] 81 07-15-22 0732) Resp:  [18-26] 18 07/15/2022 0732) BP: (110-189)/(68-136) 144/99 mmHg 15-Jul-2022 0800) SpO2:  [96 %-100 %] 99 % 2022/07/15 0732) FiO2 (%):  [30 %] 30 % 07/15/22 0732) Weight:  [71.7 kg (158 lb 1.1 oz)-75.6 kg (166 lb 10.7 oz)] 72.7 kg (160 lb 4.4 oz) 07/15/2022 0413)  HEMODYNAMICS: CVP:  [2 mmHg-9 mmHg] 9 mmHg  VENTILATOR SETTINGS: Vent Mode:  [-] PRVC FiO2 (%):  [30 %] 30 % Set Rate:  [18 bmp] 18 bmp Vt Set:  [620 mL] 620 mL PEEP:  [5 cmH20] 5 cmH20 Plateau Pressure:  [13 cmH20-18 cmH20] 18 cmH20  INTAKE / OUTPUT: Intake/Output     02/27 0701 - July 15, 2022 0700 07/15/2022 0701 - 03/01 0700   I.V. (mL/kg) 529.3 (7.3) 22.7 (0.3)   NG/GT 1000 70   IV Piggyback 200 105   Total Intake(mL/kg) 1729.3 (23.8) 197.7 (2.7)   Other 3043    Total Output 3043     Net -1313.7 +197.7          PHYSICAL EXAMINATION: General:  Chronically ill appearing male, no longer seizing. Neuro: Sedated, intubate, does not withdraw to pain.isconjucate gaze. HEENT:  [place]/AT, PERRL, EOM-I and bloody conjunctiva Cardiovascular:  Regular and tachy, Nl S1/S2, -M/R/G. Lungs:  CTA bilaterally. Abdomen:  Soft, NT, ND and +BS. Musculoskeletal: intact Skin:  Intact.  LABS:  CBC  Recent Labs Lab 07/11/13 0500 07/12/13 0340 07-15-2013 0415  WBC 7.6 5.2 6.3  HGB 9.6* 11.2* 11.7*  HCT 29.3* 33.4* 35.6*  PLT 102* 83* 111*   Coag's  Recent Labs Lab 07/10/13 0659 07/10/13 0945  APTT  --  29  INR 1.26 1.12   BMET  Recent Labs Lab 07/11/13 0500 07/12/13 0340 07/15/13 0415  NA 142 139 138  K 4.8 5.7* 4.1  CL 102 95* 92*  CO2 23 22 26   BUN 50* 72* 45*  CREATININE 12.17* 15.28* 10.08*  GLUCOSE 110* 93 107*   Electrolytes  Recent Labs Lab 07/11/13 0500 07/12/13 0340 07-15-2013 0415  CALCIUM 8.3* 9.7 10.1  MG 2.4 2.7* 2.6*  PHOS 9.6* 10.5* 7.3*   Sepsis Markers  Recent Labs Lab 07/10/13 0659 07/10/13 0721 07/10/13 0945  LATICACIDVEN 22.5* >17.00* 3.1*  PROCALCITON 0.63  --   --    ABG  Recent Labs Lab 07/11/13 0404 07/12/13 0508 07/15/2013  0412  PHART 7.414 7.422 7.486*  PCO2ART 40.2 37.6 38.5  PO2ART 135.0* 82.0 108.0*   Liver Enzymes  Recent Labs Lab 07/10/13 0659 07/10/13 0945 07/13/13 0415  AST 23 24  --   ALT 15 15  --   ALKPHOS 67 57  --   BILITOT 0.3 0.3  --   ALBUMIN 4.9 4.5 3.3*   Cardiac Enzymes  Recent Labs Lab 07/10/13 0659  PROBNP 29037.0*   Glucose  Recent Labs Lab 07/12/13 1142 07/12/13 1553 07/12/13 1951 07/12/13 2326 07/13/13 0334 07/13/13 0745  GLUCAP 113* 126* 107* 90 87 104*   Imaging Ct Head Wo Contrast  07/11/2013   CLINICAL DATA:  History of end-stage renal disease and cocaine abuse. Found down. Seizure. Abnormal pupils.  EXAM: CT HEAD WITHOUT CONTRAST  TECHNIQUE: Contiguous axial images were obtained from the  base of the skull through the vertex without intravenous contrast.  COMPARISON:  CT HEAD W/O CM dated 07/10/2013  FINDINGS: Sinuses/Soft tissues: Possible right supraorbital scalp soft tissue swelling. Example image 41/series 3. Endotracheal tube. Mucous retention cyst or polyp in the right maxillary sinus. Trace mucosal thickening of bilateral maxillary sinuses and ethmoid air cells.  Intracranial: No mass lesion, hemorrhage, hydrocephalus, acute infarct, intra-axial, or extra-axial fluid collection.  IMPRESSION: 1.  No acute intracranial abnormality. 2. Sinus disease. 3. Possible right supraorbital scalp soft tissue swelling, mild.   Electronically Signed   By: Abigail Miyamoto M.D.   On: 07/11/2013 13:19   Mr Brain Wo Contrast  07/11/2013   ADDENDUM REPORT: 07/11/2013 18:03  ADDENDUM: Findings discussed with and reconfirmed by Dr.OSVALDO CAMILO on 07/11/2013 5:50 PM.   Electronically Signed   By: Elon Alas   On: 07/11/2013 18:03   07/11/2013   CLINICAL DATA:  Status epilepticus, history of end-stage renal disease and cocaine abuse.  EXAM: MRI HEAD WITHOUT CONTRAST  TECHNIQUE: Multiplanar, multiecho pulse sequences of the brain and surrounding structures were obtained without intravenous contrast.  COMPARISON:  CT HEAD W/O CM dated 07/11/2013  FINDINGS: The ventricles and sulci are normal for patient's age. Abnormal mildly expansile FLAIR T2 hyperintense signal within the bilateral occipital lobes, to a lesser extent parietal lobes, and right frontal lobe, predominately cortical distribution. However there is a subcentimeter T2 hyperintensity within the splenium of the right corpus callosum as well as additional subcentimeter FLAIR T2 hyperintensities in the supratentorial and right pons white matter. Minimal associated reduced diffusion, and low ADC values. No midline shift. No susceptibility artifact to suggest hemorrhage.  No abnormal extra-axial fluid collections. No extra-axial masses though, contrast  enhanced sequences would be more sensitive. Normal major intracranial vascular flow voids seen at the skull base.  Ocular globes and orbital contents are unremarkable though not tailored for evaluation. No abnormal sellar expansion.  Life-support lines in place with mild paranasal sinus mucosal thickening, trace air-fluid level in the left maxillary sinus. Trace fluid signal seen in the right mastoid tip. Small right frontal scalp hematoma. No abnormal sellar expansion. No cerebellar tonsillar ectopia.  IMPRESSION: Abnormal patchy predominantly cortical signal within the parietoccipital lobes, right posterior frontal lobe. Additional patchy abnormal FLAIR T2 hyperintense signal seen, including the splenium of the corpus callosum. Constellation of findings can be seen with PRES (posterior reversible encephalopathic syndrome), though superimposed postictal MR findings may have this appearance, less likely.  White matter changes may reflect chronic small vessel ischemic disease, though may be part of the acute process.  Electronically Signed: By: Elon Alas On: 07/11/2013 17:52  Dg Chest Port 1 View  07/12/2013   CLINICAL DATA:  Intubation.  EXAM: PORTABLE CHEST - 1 VIEW  COMPARISON:  DG CHEST 1V PORT dated 07/11/2013  FINDINGS: Endotracheal tube, left IJ line, and NG tube in stable position. Persistent cardiomegaly with mild pulmonary vascular prominence. Developing pulmonary alveolar and interstitial infiltrates are present particularly in the right lung base on today's exam. Small right pleural effusion cannot be excluded. These findings suggest congestive heart failure with pulmonary edema. Superimposed pneumonia cannot be excluded . No pneumothorax. No acute osseous abnormality.  IMPRESSION: 1. Stable line and tube positions. 2. Developing congestive heart failure with pulmonary edema. Superimposed pneumonia particularly in the right lung base cannot be excluded.   Electronically Signed   By: Marcello Moores   Register   On: 07/12/2013 07:39   CXR: no acute disease, ET tube high.  ASSESSMENT / PLAN:  PULMONARY A: Acute resp failure Intubated for inability to protect airway.  P:   - SBTs with goal extubation if neuro intact, agitation main barrier  CARDIOVASCULAR A: Sedation related hypotension, resolved after d/c of pento-barb now hypertensive. P:  - Off levophed, maintain SBP 120-140, On coreg. - KVO IVF.   RENAL Lab Results  Component Value Date   CREATININE 10.08* 07/13/2013   CREATININE 15.28* 07/12/2013   CREATININE 12.17* 07/11/2013    A:  ESRD, metabolic acidosis (resolved with HD and control of seizure) and hyperkalemia. P:   - Renal consult appreciated. - HD per renal  GASTROINTESTINAL A:  GERD by history. P:   - Protonix. - Nutrition for TF.  HEMATOLOGIC A:  No active issues. P:  - Monitor for now.  INFECTIOUS A: ?aspiration, G neg pneumonia (klebsiella) P:   - change to ceftx  ENDOCRINE A:  No diabetes by history.   P:   - Monitor CBG.  NEUROLOGIC A:  Seizure, unknown if new.  MRI consistent with press, seizure activity now controlled. P:   - Off pento-barb coma. - D/C fentanyl drip. - Keppra and dilantin. -diprivan - change to precedex - Neuro inputappreciated.  TODAY'S SUMMARY: MRI consistent with PRES.  Pento-barb stopped.  On diprivan for sedation. WUA daily.  Richardson Landry Minor ACNP Maryanna Shape PCCM Pager [phone] till 3 pm If no answer page [phone]  Care during the described time interval was provided by me and/or other providers on the critical care team.  I have reviewed this patient's available data, including medical history, events of note, physical examination and test results as part of my evaluation  CC time x 68m  Deola Rewis V.  07/13/2013, 9:55 AM

## 2013-07-13 NOTE — Procedures (Signed)
EEG report.  Brief clinical history:  48 y/o with PRES and resolved refractory SE. Technique: this is a 17 channel routine scalp EEG performed at the bedside in the ICU setting, with bipolar and monopolar montages arranged in accordance to the international 10/20 system of electrode placement. One channel was dedicated to EKG recording.  The patient is intubated on the vent, off sedatives.  No activating procedures performed.  Description:as the study begins and throughout the entire recording, there is diffuse, continuous, monomorphic, delta- theta slowing with intermixed triphasic. Short runs of rhythmic delta slowing over the right fronto-temporal region noted, but no evidence of a frank ictal pattern. No focal or generalized epileptiform discharges noted.  EKG showed sinus rhythm.  Impression: this is an abnormal EEG with findings consistent with a moderately severe encephalopathy, non specific.  No electrographic seizures noted. Clinical correlation is advised.  Dorian Pod, MD

## 2013-07-13 NOTE — Progress Notes (Signed)
While preforming mouth care, pt opened eyes, forcefully sat up in bed, began flailing extremities and mouthed "get me out of here now" clearly to RN.  3 RNs at bedside to physically restrain patient.  Pt continuing to sit up and grab at tubes and lines with bilateral upper extremities.  Bilateral wrist restraints placed.  Sedation administered so that dialysis treatment would not be interfered. MD notified of events.  Will cont to monitor. Lindyn Vossler, [facility]

## 2013-07-13 NOTE — Progress Notes (Signed)
Dr. Jonnie Finner called and given VS and CVP at start of hemodialysis. Tx decreased to 4hr and UF goal set at 1l. Shortly after HD Tx started pt sat up in bed and started hitting at the ICU nurse and trying to extubate self. Dipravan gtt was increased and pt given a dose of fentanyl by ICU nurse to keep lines intact.

## 2013-07-13 NOTE — Progress Notes (Signed)
HD Tx decreased to 3hrs by Dr. Jonnie Finner per Ronny Bacon, RN

## 2013-07-13 NOTE — Progress Notes (Signed)
[facility] Progress Note   Subjective: On Diprivan now for sedation (coughing, breathing over vent). Was awake and trying to pull out lines this morning.    Filed Vitals:   07/13/13 1056 07/13/13 1100 07/13/13 1134 07/13/13 1141  BP:  134/86 151/116   Pulse: 79 78  88  Temp:      TempSrc:      Resp: 18 18  18   Height:      Weight:      SpO2: 96% 98%  100%  Exam: On vent, sedated ETT in place  No jvd  Chest is clear bilat  RRR no MRG  Abd scaphoid, nd, +bs  No LE or UE edema L forearm AVF patent Neuro- on vent, sedated  CXR- no sig dz  Dialysis: TTS [facility]  4hr 38min 71kg 1K/2.5 [place] Heparin none LFA AVF  Hectorol 4  Labs: tsat 27% pth 369   Assessment:  1 Seizures / status epilepticus- on Keppra and Dilantin IV, waking up today; multifactorial, malig HTN/PRES and uremia from poor dialysis compliance, improved 2 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, 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 3 ESRD on hemodialysis 4 Pulm edema / vol excess / HTN- resolved by xray today, close to dry wt on norvasc, coreg and losartan 5 Anemia- Hb 11, no esa  6 2HPT- get phos down w HD 7 Hyperkalemia- improved 8 Fevers- on empiric Unasyn IV, blood cx's neg   Plan: keep BP over 120, HD today minimal UF, clear solute, cont BP meds   Kelly Splinter MD  pager [phone]    cell [phone]  07/13/2013, 11:43 AM     Recent Labs Lab 07/11/13 0500 07/12/13 0340 07/13/13 0415  NA 142 139 138  K 4.8 5.7* 4.1  CL 102 95* 92*  CO2 23 22 26   GLUCOSE 110* 93 107*  BUN 50* 72* 45*  CREATININE 12.17* 15.28* 10.08*  CALCIUM 8.3* 9.7 10.1  PHOS 9.6* 10.5* 7.3*    Recent Labs Lab 07/10/13 0659 07/10/13 0945 07/13/13 0415  AST 23 24  --   ALT 15 15  --   ALKPHOS 67 57  --   BILITOT 0.3 0.3  --   PROT 8.8* 7.7  --   ALBUMIN 4.9 4.5 3.3*    Recent Labs Lab 07/10/13 0659 07/10/13 0945 07/11/13 0500 07/12/13 0340 07/13/13 0415  WBC 17.4* 13.3* 7.6 5.2 6.3  NEUTROABS 15.8* 12.1*  --   --    --   HGB 13.3 12.3* 9.6* 11.2* 11.7*  HCT 39.9 36.1* 29.3* 33.4* 35.6*  MCV 95.0 90.0 93.0 92.8 93.7  PLT 179 131* 102* 83* 111*   . amLODipine  10 mg Oral Daily  . ampicillin-sulbactam (UNASYN) IV  3 g Intravenous Q24H  . antiseptic oral rinse  15 mL Mouth Rinse QID  . carvedilol  3.125 mg Oral BID WC  . chlorhexidine  15 mL Mouth Rinse BID  . feeding supplement (PRO-STAT SUGAR FREE 64)  30 mL Per Tube BID  . levETIRAcetam  500 mg Intravenous Q12H  . losartan  100 mg Oral Daily  . pantoprazole (PROTONIX) IV  40 mg Intravenous Q24H  . phenytoin (DILANTIN) IV  100 mg Intravenous 3 times per day   . feeding supplement (NEPRO CARB STEADY) 1,000 mL (07/12/13 2010)  . niCARDipine Stopped (07/12/13 1740)  . norepinephrine (LEVOPHED) Adult infusion Stopped (07/12/13 0230)  . propofol 70 mcg/kg/min (07/13/13 1142)   acetaminophen (TYLENOL) oral liquid 160 mg/5 mL, fentaNYL,  hydrALAZINE

## 2013-07-13 NOTE — Progress Notes (Signed)
NEURO HOSPITALIST PROGRESS NOTE   SUBJECTIVE:                                                                                                                        Very agitated earlier today, trying to pull out his IV lines, and required increasing propofol and fentanyl. No further clinical seizures noted. EEG showed no electrographic seizures but findings consistent with encephalopathy. On keppra 500 mg IV BID. Getting dialyzed at this moment.   OBJECTIVE:                                                                                                                           Vital signs in last 24 hours: Temp:  [98.1 F (36.7 C)-99.7 F (37.6 C)] 98.2 F (36.8 C) (02/28 1045) Pulse Rate:  [74-139] 85 (02/28 1200) Resp:  [18-26] 18 (02/28 1200) BP: (110-189)/(68-136) 129/75 mmHg (02/28 1200) SpO2:  [96 %-100 %] 99 % (02/28 1200) FiO2 (%):  [30 %] 30 % (02/28 1150) Weight:  [71.3 kg (157 lb 3 oz)-72.7 kg (160 lb 4.4 oz)] 71.3 kg (157 lb 3 oz) (02/28 1045)  Intake/Output from previous day: 02/27 0701 - 02/28 0700 In: 1729.3 [I.V.:529.3; NG/GT:1000; IV Piggyback:200] Out: 3043  Intake/Output this shift: Total I/O In: 317.7 [I.V.:22.7; NG/GT:190; IV Piggyback:105] Out: -  Nutritional status: NPO  Past Medical History  Diagnosis Date  . ESRD (end stage renal disease) on dialysis   . Headache(784.0)   . Depression   . Anxiety     Neurologic Exam:  Patient sedated on propofol and restrained. Will defer neurological examination at this moment.   Lab Results: No results found for this basename: cbc, bmp, coags, chol, tri, ldl, hga1c   Lipid Panel No results found for this basename: CHOL, TRIG, HDL, CHOLHDL, VLDL, LDLCALC,  in the last 72 hours  Studies/Results: Ct Head Wo Contrast  07/11/2013   CLINICAL DATA:  History of end-stage renal disease and cocaine abuse. Found down. Seizure. Abnormal pupils.  EXAM: CT HEAD WITHOUT  CONTRAST  TECHNIQUE: Contiguous axial images were obtained from the base of the skull through the vertex without intravenous contrast.  COMPARISON:  CT HEAD W/O CM dated 07/10/2013  FINDINGS: Sinuses/Soft tissues: Possible right  supraorbital scalp soft tissue swelling. Example image 41/series 3. Endotracheal tube. Mucous retention cyst or polyp in the right maxillary sinus. Trace mucosal thickening of bilateral maxillary sinuses and ethmoid air cells.  Intracranial: No mass lesion, hemorrhage, hydrocephalus, acute infarct, intra-axial, or extra-axial fluid collection.  IMPRESSION: 1.  No acute intracranial abnormality. 2. Sinus disease. 3. Possible right supraorbital scalp soft tissue swelling, mild.   Electronically Signed   By: Abigail Miyamoto M.D.   On: 07/11/2013 13:19   Mr Brain Wo Contrast  07/11/2013   ADDENDUM REPORT: 07/11/2013 18:03  ADDENDUM: Findings discussed with and reconfirmed by Dr.Marne Meline on 07/11/2013 5:50 PM.   Electronically Signed   By: Elon Alas   On: 07/11/2013 18:03   07/11/2013   CLINICAL DATA:  Status epilepticus, history of end-stage renal disease and cocaine abuse.  EXAM: MRI HEAD WITHOUT CONTRAST  TECHNIQUE: Multiplanar, multiecho pulse sequences of the brain and surrounding structures were obtained without intravenous contrast.  COMPARISON:  CT HEAD W/O CM dated 07/11/2013  FINDINGS: The ventricles and sulci are normal for patient's age. Abnormal mildly expansile FLAIR T2 hyperintense signal within the bilateral occipital lobes, to a lesser extent parietal lobes, and right frontal lobe, predominately cortical distribution. However there is a subcentimeter T2 hyperintensity within the splenium of the right corpus callosum as well as additional subcentimeter FLAIR T2 hyperintensities in the supratentorial and right pons white matter. Minimal associated reduced diffusion, and low ADC values. No midline shift. No susceptibility artifact to suggest hemorrhage.  No abnormal  extra-axial fluid collections. No extra-axial masses though, contrast enhanced sequences would be more sensitive. Normal major intracranial vascular flow voids seen at the skull base.  Ocular globes and orbital contents are unremarkable though not tailored for evaluation. No abnormal sellar expansion.  Life-support lines in place with mild paranasal sinus mucosal thickening, trace air-fluid level in the left maxillary sinus. Trace fluid signal seen in the right mastoid tip. Small right frontal scalp hematoma. No abnormal sellar expansion. No cerebellar tonsillar ectopia.  IMPRESSION: Abnormal patchy predominantly cortical signal within the parietoccipital lobes, right posterior frontal lobe. Additional patchy abnormal FLAIR T2 hyperintense signal seen, including the splenium of the corpus callosum. Constellation of findings can be seen with PRES (posterior reversible encephalopathic syndrome), though superimposed postictal MR findings may have this appearance, less likely.  White matter changes may reflect chronic small vessel ischemic disease, though may be part of the acute process.  Electronically Signed: By: Elon Alas On: 07/11/2013 17:52   Dg Chest Port 1 View  07/13/2013   CLINICAL DATA:  Evaluate endotracheal tube placement  EXAM: PORTABLE CHEST - 1 VIEW  COMPARISON:  DG CHEST 1V PORT dated 07/12/2013; DG CHEST 1V PORT dated 07/10/2013; DG CHEST PORT 1VSAME DAY dated 07/10/2013  FINDINGS: Grossly unchanged cardiac silhouette and mediastinal contours with unchanged nodularity of the right hilum and right paratracheal stripe. Stable position of support apparatus. Improved aeration of the lungs. No new focal airspace opacity. No pleural effusion pneumothorax. No evidence of edema. Unchanged bones.  IMPRESSION: 1.  Stable positioning of support apparatus.  No pneumothorax. 2. Unchanged nodularity of the right hilum and right paratracheal stripe, possibly accentuated due to projection and obliquity,  although adenopathy may have a similar appearance. Further evaluation with dedicated PA and lateral radiographs after resolution of acute symptoms is recommended.   Electronically Signed   By: Sandi Mariscal M.D.   On: 07/13/2013 11:18   Dg Chest Port 1 View  07/12/2013  CLINICAL DATA:  Intubation.  EXAM: PORTABLE CHEST - 1 VIEW  COMPARISON:  DG CHEST 1V PORT dated 07/11/2013  FINDINGS: Endotracheal tube, left IJ line, and NG tube in stable position. Persistent cardiomegaly with mild pulmonary vascular prominence. Developing pulmonary alveolar and interstitial infiltrates are present particularly in the right lung base on today's exam. Small right pleural effusion cannot be excluded. These findings suggest congestive heart failure with pulmonary edema. Superimposed pneumonia cannot be excluded . No pneumothorax. No acute osseous abnormality.  IMPRESSION: 1. Stable line and tube positions. 2. Developing congestive heart failure with pulmonary edema. Superimposed pneumonia particularly in the right lung base cannot be excluded.   Electronically Signed   By: Marcello Moores  Register   On: 07/12/2013 07:39    MEDICATIONS                                                                                                                       I have reviewed the patient's current medications.  ASSESSMENT/PLAN:                                                                                                            PRES with resolved refractory SE.  Will give 500 mg IV keppra after dialysis and maintain daily keppra 500 mg BID.  Continue dilantin.  BP goal 120-140.  Will follow up  Dorian Pod, MD [facility] [phone]  07/13/2013, 12:09 PM

## 2013-07-14 ENCOUNTER — Inpatient Hospital Stay (HOSPITAL_COMMUNITY): Payer: Medicare Other

## 2013-07-14 LAB — GLUCOSE, CAPILLARY
GLUCOSE-CAPILLARY: 76 mg/dL (ref 70–99)
GLUCOSE-CAPILLARY: 110 mg/dL — AB (ref 70–99)
GLUCOSE-CAPILLARY: 78 mg/dL (ref 70–99)
Glucose-Capillary: 79 mg/dL (ref 70–99)
Glucose-Capillary: 86 mg/dL (ref 70–99)
Glucose-Capillary: 88 mg/dL (ref 70–99)
Glucose-Capillary: 89 mg/dL (ref 70–99)

## 2013-07-14 LAB — CBC
HEMATOCRIT: 36.1 % — AB (ref 39.0–52.0)
HEMOGLOBIN: 11.7 g/dL — AB (ref 13.0–17.0)
MCH: 30.7 pg (ref 26.0–34.0)
MCHC: 32.4 g/dL (ref 30.0–36.0)
MCV: 94.8 fL (ref 78.0–100.0)
Platelets: 118 10*3/uL — ABNORMAL LOW (ref 150–400)
RBC: 3.81 MIL/uL — ABNORMAL LOW (ref 4.22–5.81)
RDW: 14.8 % (ref 11.5–15.5)
WBC: 4.8 10*3/uL (ref 4.0–10.5)

## 2013-07-14 LAB — PHOSPHORUS: Phosphorus: 4.9 mg/dL — ABNORMAL HIGH (ref 2.3–4.6)

## 2013-07-14 LAB — BASIC METABOLIC PANEL
BUN: 38 mg/dL — AB (ref 6–23)
CO2: 27 mEq/L (ref 19–32)
Calcium: 10.1 mg/dL (ref 8.4–10.5)
Chloride: 96 mEq/L (ref 96–112)
Creatinine, Ser: 8 mg/dL — ABNORMAL HIGH (ref 0.50–1.35)
GFR calc non Af Amer: 7 mL/min — ABNORMAL LOW (ref >90)
GFR, EST AFRICAN AMERICAN: 8 mL/min — AB (ref >90)
GLUCOSE: 96 mg/dL (ref 70–99)
POTASSIUM: 3.7 meq/L (ref 3.7–5.3)
Sodium: 141 mEq/L (ref 137–147)

## 2013-07-14 LAB — MAGNESIUM: Magnesium: 2.5 mg/dL (ref 1.5–2.5)

## 2013-07-14 MED ORDER — ACETAMINOPHEN 325 MG PO TABS
650.0000 mg | ORAL_TABLET | Freq: Four times a day (QID) | ORAL | Status: DC | PRN
  Administered 2013-07-14 – 2013-07-18 (×2): 650 mg via ORAL
  Filled 2013-07-14: qty 2

## 2013-07-14 MED ORDER — MORPHINE SULFATE 2 MG/ML IJ SOLN
1.0000 mg | INTRAMUSCULAR | Status: DC | PRN
  Administered 2013-07-14 – 2013-07-15 (×8): 2 mg via INTRAVENOUS
  Filled 2013-07-14 (×9): qty 1

## 2013-07-14 MED ORDER — LOSARTAN POTASSIUM 50 MG PO TABS: 50.0000 mg | ORAL_TABLET | Freq: Every day | ORAL | Status: DC

## 2013-07-14 MED ORDER — LOSARTAN POTASSIUM 50 MG PO TABS
100.0000 mg | ORAL_TABLET | Freq: Every day | ORAL | Status: DC
  Administered 2013-07-15 – 2013-07-19 (×5): 100 mg via ORAL
  Filled 2013-07-14 (×5): qty 2

## 2013-07-14 MED ORDER — ONDANSETRON HCL 4 MG/2ML IJ SOLN
4.0000 mg | Freq: Four times a day (QID) | INTRAMUSCULAR | Status: DC | PRN
  Administered 2013-07-14 – 2013-07-17 (×3): 4 mg via INTRAVENOUS
  Filled 2013-07-14 (×3): qty 2

## 2013-07-14 NOTE — Procedures (Signed)
Extubation Procedure Note  Patient Details:   Name: Jeremy Mccoy DOB: October 17, 1965 MRN: [id number]   Airway Documentation:     Evaluation  O2 sats: stable throughout Complications: No apparent complications Patient did tolerate procedure well. Bilateral Breath Sounds: Clear;Diminished Suctioning: Airway Yes  Hope Pigeon, MA 07/14/2013, 9:23 AM

## 2013-07-14 NOTE — Progress Notes (Signed)
Pulmonary/Critical Care Progress Note   Name: Jeremy Mccoy MRN: [id number] DOB: [birth year]    ADMISSION DATE:  07/10/2013 CONSULTATION DATE:  07/10/2013  REFERRING MD :  Dr. Zenia Resides, ED. PRIMARY SERVICE: PCCM  CHIEF COMPLAINT:  Seizure and AMS  BRIEF PATIENT DESCRIPTION: 48 year old male with history of ESRD and cocaine abuse who was found down by family on 2/25 seizing.  EMS was called and patient was brought to the ED.  Was noted that he was not protecting his airway and was intubated.  PCCM was called to admit.  Patient is actively seizing post intubation and after paralytics were off.  SIGNIFICANT EVENTS / STUDIES:  2/25 seizure intubated 07-19-22 off pentobarb and on diprivan. LINES / TUBES: PIV ET tube 2/25>>>3/1 L IJ TLC 2/25>>> R radial a-line 2/25>>>  CULTURES: Blood 2/25>>>ng Urine 2/25>>>not done Sputum 2/25>>>klebs int to unasyn, S to ceftx  ANTIBIOTICS: Unasyn 2/25>>>2/28 ceftx 2022/07/19 >>  SUBJECTIVE:  Extubated, vocal, nad C/o headache  VITAL SIGNS: Temp:  [97.6 F (36.4 C)-98.5 F (36.9 C)] 98.2 F (36.8 C) (03/01 0408) Pulse Rate:  [73-104] 97 (03/01 0921) Resp:  [12-25] 20 (03/01 0921) BP: (106-173)/(58-116) 152/96 mmHg (03/01 0900) SpO2:  [76 %-100 %] 95 % (03/01 0921) FiO2 (%):  [30 %] 30 % (03/01 0900) Weight:  [71.3 kg (157 lb 3 oz)-72.6 kg (160 lb 0.9 oz)] 72.6 kg (160 lb 0.9 oz) (03/01 0459)  HEMODYNAMICS: CVP:  [7 mmHg] 7 mmHg  VENTILATOR SETTINGS: Vent Mode:  [-] CPAP FiO2 (%):  [30 %] 30 % Set Rate:  [18 bmp] 18 bmp Vt Set:  [620 mL] 620 mL PEEP:  [5 cmH20] 5 cmH20 Pressure Support:  [5 cmH20] 5 cmH20 Plateau Pressure:  [12 cmH20-21 cmH20] 14 cmH20  INTAKE / OUTPUT: Intake/Output     2022/07/19 0701 - 03/01 0700 03/01 0701 - 03/02 0700   I.V. (mL/kg) 554.1 (7.6)    NG/GT 1260 40   IV Piggyback 365 105   Total Intake(mL/kg) 2179.1 (30) 145 (2)   Other 1037    Total Output 1037     Net +1142.1 +145         PHYSICAL  EXAMINATION: General:  Chronically ill appearing male, awake and follows commands Neuro:MAEx4 and intact HEENT:  No LAN/JVD Cardiovascular:  HSR RRR Lungs:  CTA bilaterally. Diminished in bases  Abdomen:  Soft, NT, ND and +BS. Musculoskeletal: intact Skin:  Intact.  LABS:  CBC  Recent Labs Lab 07/12/13 0340 Jul 19, 2013 0415 07/14/13 0350  WBC 5.2 6.3 4.8  HGB 11.2* 11.7* 11.7*  HCT 33.4* 35.6* 36.1*  PLT 83* 111* 118*   Coag's  Recent Labs Lab 07/10/13 0659 07/10/13 0945  APTT  --  29  INR 1.26 1.12   BMET  Recent Labs Lab 07/12/13 0340 2013/07/19 0415 07/14/13 0350  NA 139 138 141  K 5.7* 4.1 3.7  CL 95* 92* 96  CO2 22 26 27   BUN 72* 45* 38*  CREATININE 15.28* 10.08* 8.00*  GLUCOSE 93 107* 96   Electrolytes  Recent Labs Lab 07/12/13 0340 07/19/2013 0415 07/14/13 0350  CALCIUM 9.7 10.1 10.1  MG 2.7* 2.6* 2.5  PHOS 10.5* 7.3* 4.9*   Sepsis Markers  Recent Labs Lab 07/10/13 0659 07/10/13 0721 07/10/13 0945  LATICACIDVEN 22.5* >17.00* 3.1*  PROCALCITON 0.63  --   --    ABG  Recent Labs Lab 07/11/13 0404 07/12/13 0508 07-19-13 0412  PHART 7.414 7.422 7.486*  PCO2ART 40.2 37.6 38.5  PO2ART  135.0* 82.0 108.0*   Liver Enzymes  Recent Labs Lab 07/10/13 0659 07/10/13 0945 07/13/13 0415  AST 23 24  --   ALT 15 15  --   ALKPHOS 67 57  --   BILITOT 0.3 0.3  --   ALBUMIN 4.9 4.5 3.3*   Cardiac Enzymes  Recent Labs Lab 07/10/13 0659  PROBNP 29037.0*   Glucose  Recent Labs Lab 07/13/13 1207 07/13/13 1618 07/13/13 2004 07/13/13 2345 07/14/13 0407 07/14/13 0802  GLUCAP 86 104* 85 88 76 79   Imaging Dg Chest Port 1 View  07/14/2013   CLINICAL DATA:  Endotracheal tube.  EXAM: PORTABLE CHEST - 1 VIEW  COMPARISON:  DG CHEST 1V PORT dated 07/13/2013; DG CHEST 1V PORT dated 07/10/2013; MR HEAD W/O CM dated 07/11/2013  FINDINGS: Endotracheal tube 4.6 cm above chronic. Nasogastric tube extends beyond the inferior aspect of the film. Left  internal jugular line unchanged with tip at mid SVC.  Normal heart size. Right hilar soft tissue fullness again identified. Possible right mediastinal soft tissue fullness. This appearance could be partially projectional. No pleural effusion or pneumothorax. Diffuse peribronchial thickening. No lobar consolidation. Suspect multiple tiny calcified granulomas, especially in the right upper lobe.  IMPRESSION: Stable support apparatus.  Right hilar and right paratracheal soft tissue fullness again identified. This could be partially projectional. Adenopathy or mass/ masses cannot be excluded. Recommend attention on followup or consideration of PA and lateral radiographs. CT may eventually be necessary.   Electronically Signed   By: Abigail Miyamoto M.D.   On: 07/14/2013 08:11   Dg Chest Port 1 View  07/13/2013   CLINICAL DATA:  Evaluate endotracheal tube placement  EXAM: PORTABLE CHEST - 1 VIEW  COMPARISON:  DG CHEST 1V PORT dated 07/12/2013; DG CHEST 1V PORT dated 07/10/2013; DG CHEST PORT 1VSAME DAY dated 07/10/2013  FINDINGS: Grossly unchanged cardiac silhouette and mediastinal contours with unchanged nodularity of the right hilum and right paratracheal stripe. Stable position of support apparatus. Improved aeration of the lungs. No new focal airspace opacity. No pleural effusion pneumothorax. No evidence of edema. Unchanged bones.  IMPRESSION: 1.  Stable positioning of support apparatus.  No pneumothorax. 2. Unchanged nodularity of the right hilum and right paratracheal stripe, possibly accentuated due to projection and obliquity, although adenopathy may have a similar appearance. Further evaluation with dedicated PA and lateral radiographs after resolution of acute symptoms is recommended.   Electronically Signed   By: Sandi Mariscal M.D.   On: 07/13/2013 11:18   CXR: [facility]  ASSESSMENT / PLAN:  PULMONARY A: Acute resp failure Intubated for inability to protect airway.  P:   -Extubation 3/1  CARDIOVASCULAR A:  Sedation related hypotension, resolved after d/c of pento-barb now hypertensive. P:  - Off levophed, maintain SBP 120-140, On coreg. -resume other home meds for htn - KVO IVF.   RENAL Lab Results  Component Value Date   CREATININE 8.00* 07/14/2013   CREATININE 10.08* 07/13/2013   CREATININE 15.28* 07/12/2013    A:  ESRD, metabolic acidosis (resolved with HD and control of seizure) and hyperkalemia. P:   - HD per renal  GASTROINTESTINAL A:  GERD by history. P:   - Protonix. - start renal diet  HEMATOLOGIC A:  No active issues. P:  - Monitor for now.  INFECTIOUS A: ?aspiration, G neg pneumonia (klebsiella) P:   - change to ceftx  ENDOCRINE A:  No diabetes by history.   P:   - Monitor CBG.  NEUROLOGIC A:  Seizure, unknown if new.  MRI consistent with press, seizure activity now controlled. P:   - Off pento-barb coma. - D/C fentanyl drip. - Keppra and dilantin. - Neuro input appreciated.  TODAY'S SUMMARY: Extubated, restart diet. ? Tx to triad in am.  Richardson Landry Minor ACNP Maryanna Shape PCCM Pager [phone] till 3 pm If no answer page [phone]  Care during the described time interval was provided by me and/or other providers on the critical care team.  I have reviewed this patient's available data, including medical history, events of note, physical examination and test results as part of my evaluation  CC time x 66m  ALVA,RAKESH V.

## 2013-07-14 NOTE — Progress Notes (Addendum)
  [facility] Progress Note   Subjective: off sedation, groggy and confused but conversant   Filed Vitals:   07/14/13 0921 07/14/13 1000 07/14/13 1100 07/14/13 1145  BP:  154/97 176/101 156/100  Pulse: 97 85 94 91  Temp:      TempSrc:      Resp: 20 15 22 12   Height:      Weight:      SpO2: 95% 92% 100% 97%  Exam: Lethargic, awake  No jvd  Chest is clear bilat  RRR no MRG  Abd scaphoid, nd, +bs  No LE or UE edema L forearm AVF patent Neuro- on vent, sedated  CXR- no sig dz  Dialysis: TTS [facility]  4hr 2min 71kg 1K/2.5 Bath Heparin none LFA AVF  Hectorol 4  Labs: tsat 27% pth 369   Assessment:  1 Seizures / status epilepticus- d/t uremia   (Cr 20) + malignant HTN, better 2 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, due to malignant HTN 3 ESRD on hemodialysis 4 Pulm edema- resolved, close to dry wt 5 HTN-  on norvasc, coreg and losartan 6 Anemia- Hb 11, no esa  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 7 Hyperkalemia- improved 8 Fevers- on Rocphin IV, blood cx's neg 9 Noncompliance with dialysis   Plan: decrease losartan, next HD tuesday   Kelly Splinter MD  pager [phone]    cell [phone]  07/14/2013, 1:57 PM     Recent Labs Lab 07/12/13 0340 07/13/13 0415 07/14/13 0350  NA 139 138 141  K 5.7* 4.1 3.7  CL 95* 92* 96  CO2 22 26 27   GLUCOSE 93 107* 96  BUN 72* 45* 38*  CREATININE 15.28* 10.08* 8.00*  CALCIUM 9.7 10.1 10.1  PHOS 10.5* 7.3* 4.9*    Recent Labs Lab 07/10/13 0659 07/10/13 0945 07/13/13 0415  AST 23 24  --   ALT 15 15  --   ALKPHOS 67 57  --   BILITOT 0.3 0.3  --   PROT 8.8* 7.7  --   ALBUMIN 4.9 4.5 3.3*    Recent Labs Lab 07/10/13 0659 07/10/13 0945  07/12/13 0340 07/13/13 0415 07/14/13 0350  WBC 17.4* 13.3*  < > 5.2 6.3 4.8  NEUTROABS 15.8* 12.1*  --   --   --   --   HGB 13.3 12.3*  < > 11.2* 11.7* 11.7*  HCT 39.9 36.1*  < > 33.4* 35.6* 36.1*  MCV 95.0 90.0  < > 92.8 93.7 94.8  PLT 179 131*  < > 83* 111* 118*  < > = values in this  interval not displayed. Marland Kitchen amLODipine  10 mg Oral Daily  . antiseptic oral rinse  15 mL Mouth Rinse QID  . carvedilol  3.125 mg Oral BID WC  . cefTRIAXone (ROCEPHIN)  IV  1 g Intravenous Q24H  . chlorhexidine  15 mL Mouth Rinse BID  . levETIRAcetam  500 mg Intravenous Q12H  . losartan  100 mg Oral Daily  . pantoprazole sodium  40 mg Per Tube Q1200  . phenytoin (DILANTIN) IV  100 mg Intravenous 3 times per day     acetaminophen, fentaNYL, hydrALAZINE

## 2013-07-15 ENCOUNTER — Inpatient Hospital Stay (HOSPITAL_COMMUNITY): Payer: Medicare Other

## 2013-07-15 LAB — CBC
HEMATOCRIT: 36.3 % — AB (ref 39.0–52.0)
Hemoglobin: 12.1 g/dL — ABNORMAL LOW (ref 13.0–17.0)
MCH: 31.4 pg (ref 26.0–34.0)
MCHC: 33.3 g/dL (ref 30.0–36.0)
MCV: 94.3 fL (ref 78.0–100.0)
Platelets: 149 10*3/uL — ABNORMAL LOW (ref 150–400)
RBC: 3.85 MIL/uL — AB (ref 4.22–5.81)
RDW: 14.4 % (ref 11.5–15.5)
WBC: 7.2 10*3/uL (ref 4.0–10.5)

## 2013-07-15 LAB — GLUCOSE, CAPILLARY
Glucose-Capillary: 81 mg/dL (ref 70–99)
Glucose-Capillary: 86 mg/dL (ref 70–99)
Glucose-Capillary: 115 mg/dL — ABNORMAL HIGH (ref 70–99)

## 2013-07-15 LAB — BASIC METABOLIC PANEL
BUN: 59 mg/dL — AB (ref 6–23)
CHLORIDE: 89 meq/L — AB (ref 96–112)
CO2: 21 meq/L (ref 19–32)
CREATININE: 11.01 mg/dL — AB (ref 0.50–1.35)
Calcium: 10.4 mg/dL (ref 8.4–10.5)
GFR calc Af Amer: 6 mL/min — ABNORMAL LOW (ref >90)
GFR calc non Af Amer: 5 mL/min — ABNORMAL LOW (ref >90)
Glucose, Bld: 78 mg/dL (ref 70–99)
POTASSIUM: 4 meq/L (ref 3.7–5.3)
Sodium: 135 mEq/L — ABNORMAL LOW (ref 137–147)

## 2013-07-15 MED ORDER — LIDOCAINE HCL (PF) 1 % IJ SOLN: 5.0000 mL | INTRAMUSCULAR | Status: DC | PRN

## 2013-07-15 MED ORDER — SODIUM CHLORIDE 0.9 % IV SOLN: 100.0000 mL | INTRAVENOUS | Status: DC | PRN

## 2013-07-15 MED ORDER — NEPRO/CARBSTEADY PO LIQD: 237.0000 mL | ORAL | Status: DC | PRN

## 2013-07-15 MED ORDER — CARVEDILOL 25 MG PO TABS
25.0000 mg | ORAL_TABLET | ORAL | Status: DC
  Administered 2013-07-15 – 2013-07-19 (×5): 25 mg via ORAL
  Filled 2013-07-15 (×5): qty 1

## 2013-07-15 MED ORDER — PENTAFLUOROPROP-TETRAFLUOROETH EX AERO: 1.0000 "application " | INHALATION_SPRAY | CUTANEOUS | Status: DC | PRN

## 2013-07-15 MED ORDER — LABETALOL HCL 5 MG/ML IV SOLN
20.0000 mg | Freq: Once | INTRAVENOUS | Status: AC
  Administered 2013-07-15: 20 mg via INTRAVENOUS
  Filled 2013-07-15: qty 4

## 2013-07-15 MED ORDER — HEPARIN SODIUM (PORCINE) 1000 UNIT/ML DIALYSIS: 1000.0000 [IU] | INTRAMUSCULAR | Status: DC | PRN

## 2013-07-15 MED ORDER — LIDOCAINE-PRILOCAINE 2.5-2.5 % EX CREA
1.0000 "application " | TOPICAL_CREAM | CUTANEOUS | Status: DC | PRN
  Filled 2013-07-15: qty 5

## 2013-07-15 MED ORDER — CARVEDILOL 25 MG PO TABS: 25.0000 mg | ORAL_TABLET | Freq: Two times a day (BID) | ORAL | Status: DC

## 2013-07-15 MED ORDER — LIDOCAINE-PRILOCAINE 2.5-2.5 % EX CREA
1.0000 | TOPICAL_CREAM | CUTANEOUS | Status: DC | PRN
Start: 2013-07-15 — End: 2013-07-15

## 2013-07-15 MED ORDER — LIDOCAINE-PRILOCAINE 2.5-2.5 % EX CREA: 1.0000 "application " | TOPICAL_CREAM | CUTANEOUS | Status: DC | PRN

## 2013-07-15 MED ORDER — ALTEPLASE 2 MG IJ SOLR: 2.0000 mg | Freq: Once | INTRAMUSCULAR | Status: DC | PRN

## 2013-07-15 MED ORDER — SODIUM CHLORIDE 0.9 % IV SOLN
100.0000 mL | INTRAVENOUS | Status: DC | PRN
Start: 2013-07-15 — End: 2013-07-15

## 2013-07-15 MED ORDER — PANTOPRAZOLE SODIUM 40 MG PO TBEC
40.0000 mg | DELAYED_RELEASE_TABLET | Freq: Every day | ORAL | Status: DC
  Administered 2013-07-15 – 2013-07-17 (×3): 40 mg via ORAL
  Filled 2013-07-15 (×4): qty 1

## 2013-07-15 MED ORDER — ALTEPLASE 2 MG IJ SOLR
2.0000 mg | Freq: Once | INTRAMUSCULAR | Status: AC | PRN
  Filled 2013-07-15: qty 2

## 2013-07-15 MED ORDER — LANTHANUM CARBONATE 500 MG PO CHEW
1000.0000 mg | CHEWABLE_TABLET | Freq: Three times a day (TID) | ORAL | Status: DC
  Administered 2013-07-15 – 2013-07-17 (×6): 1000 mg via ORAL
  Filled 2013-07-15 (×10): qty 2

## 2013-07-15 MED ORDER — CARVEDILOL 12.5 MG PO TABS
12.5000 mg | ORAL_TABLET | Freq: Two times a day (BID) | ORAL | Status: DC
  Administered 2013-07-15: 12.5 mg via ORAL
  Filled 2013-07-15 (×3): qty 1

## 2013-07-15 MED ORDER — DOXERCALCIFEROL 4 MCG/2ML IV SOLN
2.0000 ug | INTRAVENOUS | Status: DC
  Administered 2013-07-16 – 2013-07-18 (×2): 2 ug via INTRAVENOUS
  Filled 2013-07-15 (×2): qty 2

## 2013-07-15 MED ORDER — CARVEDILOL 25 MG PO TABS
25.0000 mg | ORAL_TABLET | ORAL | Status: DC
  Administered 2013-07-16 – 2013-07-18 (×2): 25 mg via ORAL
  Filled 2013-07-15 (×2): qty 1

## 2013-07-15 NOTE — Progress Notes (Signed)
Pulmonary/Critical Care Progress Note   Name: Jeremy Mccoy MRN: [id number] DOB: 10/03/1965    ADMISSION DATE:  07/10/2013 CONSULTATION DATE:  07/10/2013  REFERRING MD :  Dr. Zenia Resides, ED. PRIMARY SERVICE: PCCM  CHIEF COMPLAINT:  Seizure and AMS  BRIEF PATIENT DESCRIPTION: 48 year old male with history of ESRD and cocaine abuse who was found down by family on 2/25 seizing.  EMS was called and patient was brought to the ED.  Was noted that he was not protecting his airway and was intubated.  PCCM was called to admit.  Patient is actively seizing post intubation and after paralytics were off.  SIGNIFICANT EVENTS / STUDIES:  2/25 seizure intubated 2022/07/20 off pentobarb and on diprivan. LINES / TUBES: PIV ET tube 2/25>>>3/1 L IJ TLC 2/25>>>3/2 (planned d/c after PIV established) R radial a-line 2/25>>>out  CULTURES: Blood 2/25>>>neg Urine 2/25>>>not done Sputum 2/25>>>klebs int to unasyn, S to ceftx  ANTIBIOTICS: Unasyn 2/25>>>2/28 ceftx 20-Jul-2022 >>>3/2  SUBJECTIVE:  No distress, up in chair.  No acute events.   VITAL SIGNS: Temp:  [97.5 F (36.4 C)-98 F (36.7 C)] 98 F (36.7 C) (03/02 0745) Pulse Rate:  [91-119] 106 (03/02 0930) Resp:  [11-32] 20 (03/02 0930) BP: (146-183)/(73-158) 164/107 mmHg (03/02 0930) SpO2:  [93 %-98 %] 96 % (03/02 0930)  INTAKE / OUTPUT: Intake/Output     03/01 0701 - 03/02 0700 03/02 0701 - 03/03 0700   P.O. 840 200   I.V. (mL/kg)     Other 90    NG/GT 55    IV Piggyback 155 210   Total Intake(mL/kg) 1140 (15.7) 410 (5.6)   Other     Stool 1    Total Output 1     Net +1139 +410         PHYSICAL EXAMINATION: General:  Chronically ill appearing male Neuro: AAOx4, speech clear, MAEx4 and intact HEENT:  No LAN/JVD Cardiovascular:  HSR RRR Lungs:  CTA bilaterally. Diminished in bases  Abdomen:  Soft, NT, ND and +BS. Musculoskeletal: intact Skin:  Intact.  LABS:  CBC  Recent Labs Lab July 20, 2013 0415 07/14/13 0350 07/15/13 0525  WBC  6.3 4.8 7.2  HGB 11.7* 11.7* 12.1*  HCT 35.6* 36.1* 36.3*  PLT 111* 118* 149*   Coag's  Recent Labs Lab 07/10/13 0659 07/10/13 0945  APTT  --  29  INR 1.26 1.12   BMET  Recent Labs Lab July 20, 2013 0415 07/14/13 0350 07/15/13 0525  NA 138 141 135*  K 4.1 3.7 4.0  CL 92* 96 89*  CO2 26 27 21   BUN 45* 38* 59*  CREATININE 10.08* 8.00* 11.01*  GLUCOSE 107* 96 78   Electrolytes  Recent Labs Lab 07/12/13 0340 07/29/2013 0415 07/14/13 0350 07/15/13 0525  CALCIUM 9.7 10.1 10.1 10.4  MG 2.7* 2.6* 2.5  --   PHOS 10.5* 7.3* 4.9*  --    Sepsis Markers  Recent Labs Lab 07/10/13 0659 07/10/13 0721 07/10/13 0945  LATICACIDVEN 22.5* >17.00* 3.1*  PROCALCITON 0.63  --   --    ABG  Recent Labs Lab 07/11/13 0404 07/12/13 0508 08/08/2013 0412  PHART 7.414 7.422 7.486*  PCO2ART 40.2 37.6 38.5  PO2ART 135.0* 82.0 108.0*   Liver Enzymes  Recent Labs Lab 07/10/13 0659 07/10/13 0945 08/03/2013 0415  AST 23 24  --   ALT 15 15  --   ALKPHOS 67 57  --   BILITOT 0.3 0.3  --   ALBUMIN 4.9 4.5 3.3*   Cardiac Enzymes  Recent Labs Lab 07/10/13 0659  PROBNP 29037.0*   Glucose  Recent Labs Lab 07/14/13 1240 07/14/13 1609 07/14/13 2028 07/14/13 2347 07/15/13 0327 07/15/13 0742  GLUCAP 89 110* 78 86 81 86   Imaging Dg Chest Port 1 View  07/15/2013   CLINICAL DATA:  Extubation.  End-stage renal disease.  EXAM: PORTABLE CHEST - 1 VIEW  COMPARISON:  DG CHEST 1V PORT dated 07/14/2013; DG CHEST 1V PORT dated 07/13/2013  FINDINGS: Endotracheal tube removed.  Nasogastric tube removed.  Left IJ line tip:  SVC.  Mild cardiomegaly. Mild airway thickening noted. Upper zone vascular prominence could be positional or due to pulmonary venous hypertension.  IMPRESSION: 1. Airway thickening may reflect bronchitis or reactive airways disease. 2. Bowel cardiomegaly. Possible pulmonary venous hypertension. No overt edema.   Electronically Signed   By: Sherryl Barters M.D.   On: 07/15/2013  07:58   Dg Chest Port 1 View  07/14/2013   CLINICAL DATA:  Endotracheal tube.  EXAM: PORTABLE CHEST - 1 VIEW  COMPARISON:  DG CHEST 1V PORT dated 07/13/2013; DG CHEST 1V PORT dated 07/10/2013; MR HEAD W/O CM dated 07/11/2013  FINDINGS: Endotracheal tube 4.6 cm above chronic. Nasogastric tube extends beyond the inferior aspect of the film. Left internal jugular line unchanged with tip at mid SVC.  Normal heart size. Right hilar soft tissue fullness again identified. Possible right mediastinal soft tissue fullness. This appearance could be partially projectional. No pleural effusion or pneumothorax. Diffuse peribronchial thickening. No lobar consolidation. Suspect multiple tiny calcified granulomas, especially in the right upper lobe.  IMPRESSION: Stable support apparatus.  Right hilar and right paratracheal soft tissue fullness again identified. This could be partially projectional. Adenopathy or mass/ masses cannot be excluded. Recommend attention on followup or consideration of PA and lateral radiographs. CT may eventually be necessary.   Electronically Signed   By: Abigail Miyamoto M.D.   On: 07/14/2013 08:11   CXR: [place]  ASSESSMENT / PLAN:  PULMONARY A:  Acute Respiratory Failure - in setting of inability to protect airway  P:   -Extubation 3/1 -pulmonary hygiene, mobilize as able  CARDIOVASCULAR A:  Hypotension - sedation related, resolved after d/c of pento-barb now hypertensive. Hypertension - chronic, on cozaar, norvasc at home P:  -increase coreg 3/2 -continue home cozaar, norvasc  RENAL A:   ESRD Metabolic Acidosis - resolved with HD and control of seizure  Hyperkalemia.  P:   - HD per renal  GASTROINTESTINAL A:   GERD by history. P:   - continue home protonix  - start renal diet  HEMATOLOGIC A:   No active issues. P:  - Monitor CBC PRN  INFECTIOUS A:  Questionable Aspiration / Klebsiealla Pneumonia  P:   -d/c abx 3/2  ENDOCRINE A:   No diabetes by history.    P:   - Monitor CBG.  NEUROLOGIC A:   Seizures - status epilepticus on admit, unknown if new.  MRI consistent with press, seizure activity now controlled. PRES  P:   -Keppra and dilantin. -Neuro input appreciated. -Drug counseling / SW consult for outpatient options  GLOBAL -transfer to SDU & TRH as of am 3/3 0700.  PCCM will sign off.   Noe Gens, NP-C [facility] Pgr: [phone] or [phone]    PCCM ATTENDING: I have interviewed and examined the patient and reviewed the database. I have formulated the assessment and plan as reflected in the note above with amendments made by me.   Merton Border,  MD;  PCCM service; Mobile [phone]

## 2013-07-15 NOTE — Progress Notes (Signed)
Pt arrived to unit via wheelchair by 2 RN escort. Resting in bed. PIV SL. VS placed in eMAR. No distress noted. SCD placed. CHG bath completed. Will continue to monitor. Possible dialysis today.

## 2013-07-15 NOTE — Progress Notes (Signed)
  [facility] Progress Note   Subjective: off sedation, groggy and confused but conversant - remembers me "i will never miss dialysis again"- wanting to drink  Filed Vitals:   07/15/13 0400 07/15/13 0500 07/15/13 0513 07/15/13 0600  BP: 157/96 171/104 171/104 178/104  Pulse: 98 96  109  Temp:      TempSrc:      Resp: 17 15  20   Height:      Weight:      SpO2: 94% 98%  98%  Exam: Lethargic, awake  No jvd  Chest is clear bilat  RRR no MRG  Abd scaphoid, nd, +bs  No LE or UE edema L forearm AVF patent Neuro- on vent, sedated  CXR- no sig dz  Dialysis: TTS [facility]  4hr 69min 71kg 1K/2.5 Bath Heparin none LFA AVF  Hectorol 4  Labs: tsat 27% pth 369   Assessment:  1 Seizures / status epilepticus- d/t uremia   (Cr 20) + malignant HTN, better- on keppra/dilantin 2 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, due to malignant HTN 3 ESRD on hemodialysis- TTS via AVF- next due tomorrow 4 Pulm edema- resolved 5 HTN-  on norvasc 10 , coreg 3.125 BID- will increase and losartan 100- still high- will challenge with HD tomorrow 6 Anemia- Hb 12, no esa  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- will add binder as well- fosrenol- will decrease hectorol due to high calcium  7 Hyperkalemia- resolved 8 Fevers- on Rocphin IV, blood cx's neg 9 Noncompliance with dialysis- hoping this will prove a valuable lesson      Alfa Leibensperger A   07/15/2013, 7:04 AM     Recent Labs Lab 07/12/13 0340 07/13/13 0415 07/14/13 0350 07/15/13 0525  NA 139 138 141 135*  K 5.7* 4.1 3.7 4.0  CL 95* 92* 96 89*  CO2 22 26 27 21   GLUCOSE 93 107* 96 78  BUN 72* 45* 38* 59*  CREATININE 15.28* 10.08* 8.00* 11.01*  CALCIUM 9.7 10.1 10.1 10.4  PHOS 10.5* 7.3* 4.9*  --     Recent Labs Lab 07/10/13 0659 07/10/13 0945 07/13/13 0415  AST 23 24  --   ALT 15 15  --   ALKPHOS 67 57  --   BILITOT 0.3 0.3  --   PROT 8.8* 7.7  --   ALBUMIN 4.9 4.5 3.3*    Recent Labs Lab 07/10/13 0659 07/10/13 0945  07/13/13 0415  07/14/13 0350 07/15/13 0525  WBC 17.4* 13.3*  < > 6.3 4.8 7.2  NEUTROABS 15.8* 12.1*  --   --   --   --   HGB 13.3 12.3*  < > 11.7* 11.7* 12.1*  HCT 39.9 36.1*  < > 35.6* 36.1* 36.3*  MCV 95.0 90.0  < > 93.7 94.8 94.3  PLT 179 131*  < > 111* 118* 149*  < > = values in this interval not displayed. Marland Kitchen amLODipine  10 mg Oral Daily  . antiseptic oral rinse  15 mL Mouth Rinse QID  . carvedilol  3.125 mg Oral BID WC  . cefTRIAXone (ROCEPHIN)  IV  1 g Intravenous Q24H  . chlorhexidine  15 mL Mouth Rinse BID  . levETIRAcetam  500 mg Intravenous Q12H  . losartan  100 mg Oral Daily  . pantoprazole sodium  40 mg Per Tube Q1200  . phenytoin (DILANTIN) IV  100 mg Intravenous 3 times per day     acetaminophen, hydrALAZINE, morphine injection, ondansetron (ZOFRAN) IV

## 2013-07-15 NOTE — Evaluation (Signed)
Physical Therapy Evaluation Patient Details Name: Jeremy Mccoy MRN: [id number] DOB: 11/15/1965 Today's Date: 07/15/2013 Time: [id number] PT Time Calculation (min): 23 min  PT Assessment / Plan / Recommendation History of Present Illness  Pt on ESRD with cocaine abuse. Family found seizing  Clinical Impression  Pt presenting with delayed processing, co-ordination deficits, and decreased activity tolerance. Pt with increased BP s/p PT of 211/114. RN aware. Anticipate pt to progress well enough to be able to d/c home with 24/7 supervision. Acute PT to con't to work with pt to progress mobility.     PT Assessment  Patient needs continued PT services    Follow Up Recommendations  Home health PT;Supervision/Assistance - 24 hour, may progress and not need HHPT    Does the patient have the potential to tolerate intense rehabilitation      Barriers to Discharge        Equipment Recommendations   (TBD)    Recommendations for Other Services     Frequency Min 3X/week    Precautions / Restrictions Precautions Precautions: Fall Restrictions Weight Bearing Restrictions: No   Pertinent Vitals/Pain Pts BP 211/114 s/p 24' of ambulation. Pt c/o dizziness      Mobility  Bed Mobility Overal bed mobility: Needs Assistance Bed Mobility: Supine to Sit Supine to sit: Min assist General bed mobility comments: maximal directional cues Transfers Overall transfer level: Needs assistance Equipment used: None Transfers: Sit to/from Stand Sit to Stand: Min assist General transfer comment: max directional v/c's, minA to steady pt Ambulation/Gait Ambulation/Gait assistance: Min assist Ambulation Distance (Feet): 50 Feet Assistive device: 1 person hand held assist Gait Pattern/deviations: Step-through pattern Gait velocity: slow, guarded General Gait Details: pt with report "I think I need to sit down." max encouragement required to ambulate    Exercises     PT Diagnosis: Difficulty  walking  PT Problem List: Decreased strength;Decreased activity tolerance;Decreased balance;Decreased mobility PT Treatment Interventions: DME instruction;Gait training;Stair training;Functional mobility training;Therapeutic activities;Therapeutic exercise;Balance training;Neuromuscular re-education     PT Goals(Current goals can be found in the care plan section) Acute Rehab PT Goals PT Goal Formulation: With patient Time For Goal Achievement: 07/22/13 Potential to Achieve Goals: Good  Visit Information  Last PT Received On: 07/15/13 Assistance Needed: +1 History of Present Illness: Pt on ESRD with cocaine abuse. Family found seizing       Prior [place] expects to be discharged to:: Private residence Living Arrangements: Parent (brother and family friends) Available Help at Discharge: Family;Available 24 hours/day Type of Home: House Home Access: Stairs to enter [employer] of Steps: 1 Entrance Stairs-Rails: None Home Layout: One level Home Equipment: None Prior Function Level of Independence: Independent Comments: pt on disablity goes to HD T, TH, Sa Communication Communication: Expressive difficulties (delayed processing) Dominant Hand: Right    Cognition  Cognition Arousal/Alertness: Awake/alert Behavior During Therapy: Flat affect Overall Cognitive Status: Impaired/Different from baseline Area of Impairment: Orientation;Attention;Memory;Problem solving Orientation Level: Disoriented to;Situation Current Attention Level: Sustained Memory: Decreased short-term memory Problem Solving: Slow processing;Decreased initiation;Difficulty sequencing;Requires verbal cues;Requires tactile cues    Extremity/Trunk Assessment Upper Extremity Assessment Upper Extremity Assessment: Overall WFL for tasks assessed (however delayed finger/nose test) Lower Extremity Assessment Lower Extremity Assessment: Overall WFL for tasks  assessed Cervical / Trunk Assessment Cervical / Trunk Assessment: Normal   Balance Balance Overall balance assessment: Needs assistance Sitting-balance support: Feet supported;Bilateral upper extremity supported Sitting balance-Leahy Scale: Fair Standing balance support: No upper extremity supported Standing balance-Leahy Scale: Poor Standing balance  comment: requires 1 person hand held assist for safety  End of Session PT - End of Session Equipment Utilized During Treatment: Gait belt Activity Tolerance: Patient limited by fatigue Patient left: in chair;with call bell/phone within reach Nurse Communication: Mobility status (BP 211/114)  GP     [name] 07/15/2013, 9:52 AM  Kittie Plater, PT, DPT Pager #: [phone] Office #: [phone]

## 2013-07-15 NOTE — Progress Notes (Signed)
Pt hypertensive, sustaining [id number] systolic pressure and ST, administered 20 mg Hydralazine with little improvement. Called Elink MD to see if we should try a different approach to lowering his BP/HR. Pt current BP is 172/98, Dr. Nelda Marseille did not order any additional interventions since his SBP goal is 170 and his actual is so close to that.  Will continue to monitor pt and treat accordingly.   Sherial Ebrahim GARNER

## 2013-07-16 DIAGNOSIS — E43 Unspecified severe protein-calorie malnutrition: Secondary | ICD-10-CM

## 2013-07-16 LAB — CBC
HCT: 37.8 % — ABNORMAL LOW (ref 39.0–52.0)
Hemoglobin: 12.8 g/dL — ABNORMAL LOW (ref 13.0–17.0)
MCH: 31.4 pg (ref 26.0–34.0)
MCHC: 33.9 g/dL (ref 30.0–36.0)
MCV: 92.6 fL (ref 78.0–100.0)
PLATELETS: 186 10*3/uL (ref 150–400)
RBC: 4.08 MIL/uL — AB (ref 4.22–5.81)
RDW: 14.6 % (ref 11.5–15.5)
WBC: 9 10*3/uL (ref 4.0–10.5)

## 2013-07-16 LAB — RENAL FUNCTION PANEL
ALBUMIN: 3.6 g/dL (ref 3.5–5.2)
BUN: 80 mg/dL — ABNORMAL HIGH (ref 6–23)
CHLORIDE: 89 meq/L — AB (ref 96–112)
CO2: 20 mEq/L (ref 19–32)
Calcium: 10.6 mg/dL — ABNORMAL HIGH (ref 8.4–10.5)
Creatinine, Ser: 12.86 mg/dL — ABNORMAL HIGH (ref 0.50–1.35)
GFR calc Af Amer: 5 mL/min — ABNORMAL LOW (ref >90)
GFR, EST NON AFRICAN AMERICAN: 4 mL/min — AB (ref >90)
Glucose, Bld: 96 mg/dL (ref 70–99)
POTASSIUM: 4.8 meq/L (ref 3.7–5.3)
Phosphorus: 7.6 mg/dL — ABNORMAL HIGH (ref 2.3–4.6)
Sodium: 138 mEq/L (ref 137–147)

## 2013-07-16 LAB — CULTURE, BLOOD (ROUTINE X 2)
CULTURE: NO GROWTH
Culture: NO GROWTH

## 2013-07-16 MED ORDER — ENOXAPARIN SODIUM 30 MG/0.3ML ~~LOC~~ SOLN
30.0000 mg | SUBCUTANEOUS | Status: DC
  Administered 2013-07-16 – 2013-07-18 (×3): 30 mg via SUBCUTANEOUS
  Filled 2013-07-16 (×4): qty 0.3

## 2013-07-16 MED ORDER — LEVETIRACETAM 500 MG PO TABS
500.0000 mg | ORAL_TABLET | Freq: Two times a day (BID) | ORAL | Status: DC
  Administered 2013-07-16 – 2013-07-19 (×6): 500 mg via ORAL
  Filled 2013-07-16 (×8): qty 1

## 2013-07-16 MED ORDER — DOXERCALCIFEROL 4 MCG/2ML IV SOLN
INTRAVENOUS | Status: AC
  Filled 2013-07-16: qty 2

## 2013-07-16 MED ORDER — NEPRO/CARBSTEADY PO LIQD
237.0000 mL | ORAL | Status: DC
  Filled 2013-07-16 (×2): qty 237

## 2013-07-16 MED ORDER — LABETALOL HCL 5 MG/ML IV SOLN
10.0000 mg | INTRAVENOUS | Status: DC | PRN
  Administered 2013-07-16: 10 mg via INTRAVENOUS
  Filled 2013-07-16 (×3): qty 4

## 2013-07-16 MED ORDER — PHENYTOIN SODIUM EXTENDED 100 MG PO CAPS
100.0000 mg | ORAL_CAPSULE | Freq: Three times a day (TID) | ORAL | Status: DC
  Administered 2013-07-16 – 2013-07-18 (×6): 100 mg via ORAL
  Filled 2013-07-16 (×8): qty 1

## 2013-07-16 NOTE — Progress Notes (Signed)
[facility] Progress Note [name] TEAM 1 - Stepdown/ICU TEAM   Jeremy Mccoy [id number] DOB: [birth year] DOA: 07/10/2013 PCP: No PCP Per Patient  Brief narrative: Jeremy Mccoy is a 48 y.o. male presenting on 07/10/2013 with  has a past medical history of ESRD (end stage renal disease) on dialysis, cocaine abuse, Headache, Depression; and Anxiety. who presents with a seizure and is brought in by family. He was seizing after intubation as well.   Subjective: Alert, weak and lightheaded after ambulating  Assessment/Plan: Principal Problem:   Status epilepticus - due to PRES? - cont Keppra and Dilantin per Neuro  Active Problems: PRES-  HTN (hypertension) - BP more reasonable- agrees to continue meds    Acute respiratory failure - possible aspiration- Klebsiella from trach aspirate - due to inability to protect airway - currently on Unasyn- change to Cefepime on d/c     ESRD (end stage renal disease) - cont hemodialysis  Non-compliance with dialysis    Protein-calorie malnutrition, severe - does not like the hospital food    Code Status: Full code Family Communication: none Disposition Plan: SDU  Consultants: Nephro  Procedures: PIV  ET tube 2/25>>>3/1  L IJ TLC 2/25>>>3/2 (planned d/c after PIV established)  R radial a-line 2/25>>>out  CULTURES:  Blood 2/25>>>neg  Urine 2/25>>>not done  Sputum 2/25>>>klebs int to unasyn, S to ceftx   Antibiotics: Antibiotics Given (last 72 hours)   Date/Time Action Medication Dose Rate   07/13/13 1444 Given   cefTRIAXone (ROCEPHIN) 1 g in dextrose 5 % 50 mL IVPB 1 g 100 mL/hr   07/14/13 1559 Given   cefTRIAXone (ROCEPHIN) 1 g in dextrose 5 % 50 mL IVPB 1 g 100 mL/hr       DVT prophylaxis: Add Lvoenox  Objective: Filed Weights   07/16/13 0300 07/16/13 0740 07/16/13 1143  Weight: 72.258 kg (159 lb 4.8 oz) 73 kg (160 lb 15 oz) 68.5 kg (151 lb 0.2 oz)   Blood pressure 145/99, pulse 120, temperature 98.2  F (36.8 C), temperature source Oral, resp. rate 20, height 6' (1.829 m), weight 68.5 kg (151 lb 0.2 oz), SpO2 97.00%.  Intake/Output Summary (Last 24 hours) at 07/16/13 1337 Last data filed at 07/16/13 1300  Gross per 24 hour  Intake    460 ml  Output   4227 ml  Net  -3767 ml     Exam: General: No acute respiratory distress Lungs: Clear to auscultation bilaterally without wheezes or crackles Cardiovascular: Regular rate and rhythm without murmur gallop or rub normal S1 and S2- HR in110s Abdomen: Nontender, nondistended, soft, bowel sounds positive, no rebound, no ascites, no appreciable mass Extremities: No significant cyanosis, clubbing, or edema bilateral lower extremities  Data Reviewed: Basic Metabolic Panel:  Recent Labs Lab 07/10/13 0945 07/11/13 0500 07/12/13 0340 07/13/13 0415 07/14/13 0350 07/15/13 0525 07/16/13 0749  NA 134* 142 139 138 141 135* 138  K 7.6* 4.8 5.7* 4.1 3.7 4.0 4.8  CL 83* 102 95* 92* 96 89* 89*  CO2 18* 23 22 26 27 21 20   GLUCOSE 142* 110* 93 107* 96 78 96  BUN 109* 50* 72* 45* 38* 59* 80*  CREATININE 22.78* 12.17* 15.28* 10.08* 8.00* 11.01* 12.86*  CALCIUM 9.9 8.3* 9.7 10.1 10.1 10.4 10.6*  MG  --  2.4 2.7* 2.6* 2.5  --   --   PHOS  --  9.6* 10.5* 7.3* 4.9*  --  7.6*   Liver Function Tests:  Recent Labs Lab 07/10/13 0659 07/10/13  0945 07/13/13 0415 07/16/13 0749  AST 23 24  --   --   ALT 15 15  --   --   ALKPHOS 67 57  --   --   BILITOT 0.3 0.3  --   --   PROT 8.8* 7.7  --   --   ALBUMIN 4.9 4.5 3.3* 3.6   No results found for this basename: LIPASE, AMYLASE,  in the last 168 hours No results found for this basename: AMMONIA,  in the last 168 hours CBC:  Recent Labs Lab 07/10/13 0659 07/10/13 0945  07/12/13 0340 07/13/13 0415 07/14/13 0350 07/15/13 0525 07/16/13 0749  WBC 17.4* 13.3*  < > 5.2 6.3 4.8 7.2 9.0  NEUTROABS 15.8* 12.1*  --   --   --   --   --   --   HGB 13.3 12.3*  < > 11.2* 11.7* 11.7* 12.1* 12.8*  HCT  39.9 36.1*  < > 33.4* 35.6* 36.1* 36.3* 37.8*  MCV 95.0 90.0  < > 92.8 93.7 94.8 94.3 92.6  PLT 179 131*  < > 83* 111* 118* 149* 186  < > = values in this interval not displayed. Cardiac Enzymes: No results found for this basename: CKTOTAL, CKMB, CKMBINDEX, TROPONINI,  in the last 168 hours BNP (last 3 results)  Recent Labs  07/10/13 0659  PROBNP 29037.0*   CBG:  Recent Labs Lab 07/14/13 2028 07/14/13 2347 07/15/13 0327 07/15/13 0742 07/15/13 1244  GLUCAP 78 86 81 86 115*    Recent Results (from the past 240 hour(s))  CULTURE, BLOOD (ROUTINE X 2)     Status: None   Collection Time    07/10/13  6:52 AM      Result Value Ref Range Status   Specimen Description BLOOD RIGHT ANTECUBITAL   Final   Special Requests BOTTLES DRAWN AEROBIC AND ANAEROBIC 10CC   Final   Culture  Setup Time     Final   Value: 07/10/2013 12:40     Performed at [facility]   Culture     Final   Value: NO GROWTH 5 DAYS     Performed at [facility]   Report Status 07/16/2013 FINAL   Final  CULTURE, BLOOD (ROUTINE X 2)     Status: None   Collection Time    07/10/13  6:59 AM      Result Value Ref Range Status   Specimen Description BLOOD RIGHT FOREARM   Final   Special Requests BOTTLES DRAWN AEROBIC AND ANAEROBIC 10CC   Final   Culture  Setup Time     Final   Value: 07/10/2013 12:40     Performed at [facility]   Culture     Final   Value: NO GROWTH 5 DAYS     Performed at [facility]   Report Status 07/16/2013 FINAL   Final  MRSA PCR SCREENING     Status: None   Collection Time    07/10/13  9:39 AM      Result Value Ref Range Status   MRSA by PCR NEGATIVE  NEGATIVE Final   Comment:            The GeneXpert MRSA Assay (FDA     approved for NASAL specimens     only), is one component of a     comprehensive MRSA colonization     surveillance program. It is not     intended to diagnose MRSA  infection nor to guide or     monitor treatment for     MRSA  infections.  CULTURE, RESPIRATORY (NON-EXPECTORATED)     Status: None   Collection Time    07/10/13  1:44 PM      Result Value Ref Range Status   Specimen Description ENDOTRACHEAL   Final   Special Requests TRACHEAL ASPIRATE   Final   Gram Stain     Final   Value: MODERATE WBC PRESENT, PREDOMINANTLY PMN     RARE SQUAMOUS EPITHELIAL CELLS PRESENT     FEW GRAM POSITIVE COCCI     IN CLUSTERS FEW GRAM NEGATIVE RODS     Performed at [facility]   Culture     Final   Value: ABUNDANT KLEBSIELLA OXYTOCA     Performed at [facility]   Report Status 07/13/2013 FINAL   Final   Organism ID, Bacteria KLEBSIELLA OXYTOCA   Final     Studies:  Recent x-ray studies have been reviewed in detail by the Attending Physician  Scheduled Meds:  Scheduled Meds: . amLODipine  10 mg Oral Daily  . carvedilol  25 mg Oral 2 times per day on Sun Mon Wed Fri   And  . carvedilol  25 mg Oral Once per day on Tue Thu Sat  . doxercalciferol      . doxercalciferol  2 mcg Intravenous Q T,Th,Sa-HD  . lanthanum  1,000 mg Oral TID WC  . levETIRAcetam  500 mg Oral BID  . losartan  100 mg Oral Daily  . pantoprazole  40 mg Oral Daily  . phenytoin  100 mg Oral TID   Continuous Infusions:   Time spent on care of this patient: 25 min   Debbe Odea, MD  [facility] Office  [phone] Pager - Text Page per Shea Evans as per below:  On-Call/Text Page:      [email or web address]  If 7PM-7AM, please contact night-coverage [email or web address] 07/16/2013, 1:37 PM   LOS: 6 days

## 2013-07-16 NOTE — Progress Notes (Signed)
[facility] Progress Note   Subjective: seen on dialysis polite- BP has been high, hoping will decrease with HD  Filed Vitals:   07/16/13 0745 07/16/13 0800 07/16/13 0830 07/16/13 0854  BP: 170/102 182/123 177/123 180/110  Pulse: 103 105 112 113  Temp:      TempSrc:      Resp:      Height:      Weight:      SpO2:      Exam: Lethargic, awake  No jvd  Chest is clear bilat  RRR no MRG  Abd scaphoid, nd, +bs  No LE or UE edema L forearm AVF patent Neuro- on vent, sedated  CXR- no sig dz  Dialysis: TTS [facility]  4hr 74min 71kg 1K/2.5 [place] Heparin none LFA AVF  Hectorol 4  Labs: tsat 27% pth 369   Assessment:  1 Seizures / status epilepticus- d/t uremia   (Cr 20) + malignant HTN, better- on keppra/dilantin- per neuro 2 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, due to malignant HTN- seems to be improving 3 ESRD on hemodialysis- TTS via AVF- volume removal to help with BP 4 Pulm edema- resolved 5 HTN-  on norvasc 10 , coreg 25 BID- and losartan 100- still high- will challenge with HD today- goal for 5 liters- hopefully this will help  6 Anemia- Hb 12, no esa  7 2HPT-  fosrenol-  Have decreased hectorol due to high calcium  7 Hyperkalemia- resolved 8 Fevers- off antibiotics 9 Noncompliance with dialysis- hoping this will prove Mccoy valuable lesson 10. Dispo- get OOB  to ambulate, hopefully home soon      Jeremy Mccoy   07/16/2013, 9:00 AM     Recent Labs Lab 07/13/13 0415 07/14/13 0350 07/15/13 0525 07/16/13 0749  NA 138 141 135* 138  K 4.1 3.7 4.0 4.8  CL 92* 96 89* 89*  CO2 26 27 21 20   GLUCOSE 107* 96 78 96  BUN 45* 38* 59* 80*  CREATININE 10.08* 8.00* 11.01* 12.86*  CALCIUM 10.1 10.1 10.4 10.6*  PHOS 7.3* 4.9*  --  7.6*    Recent Labs Lab 07/10/13 0659 07/10/13 0945 07/13/13 0415 07/16/13 0749  AST 23 24  --   --   ALT 15 15  --   --   ALKPHOS 67 57  --   --   BILITOT 0.3 0.3  --   --   PROT 8.8* 7.7  --   --   ALBUMIN 4.9 4.5 3.3* 3.6     Recent Labs Lab 07/10/13 0659 07/10/13 0945  07/14/13 0350 07/15/13 0525 07/16/13 0749  WBC 17.4* 13.3*  < > 4.8 7.2 9.0  NEUTROABS 15.8* 12.1*  --   --   --   --   HGB 13.3 12.3*  < > 11.7* 12.1* 12.8*  HCT 39.9 36.1*  < > 36.1* 36.3* 37.8*  MCV 95.0 90.0  < > 94.8 94.3 92.6  PLT 179 131*  < > 118* 149* 186  < > = values in this interval not displayed. Marland Kitchen amLODipine  10 mg Oral Daily  . carvedilol  25 mg Oral 2 times per day on Sun Mon Wed Fri   And  . carvedilol  25 mg Oral Once per day on Tue Thu Sat  . doxercalciferol  2 mcg Intravenous Q T,Th,Sa-HD  . lanthanum  1,000 mg Oral TID WC  . levETIRAcetam  500 mg Intravenous Q12H  . losartan  100 mg Oral Daily  . pantoprazole  40 mg Oral Daily  . phenytoin (DILANTIN) IV  100 mg Intravenous 3 times per day     sodium chloride, sodium chloride, acetaminophen, feeding supplement (NEPRO CARB STEADY), heparin, labetalol, lidocaine (PF), lidocaine-prilocaine, ondansetron (ZOFRAN) IV, pentafluoroprop-tetrafluoroeth

## 2013-07-16 NOTE — Progress Notes (Signed)
NUTRITION FOLLOW UP  Patient continues to meet criteria for severe malnutrition in the context of chronic illness as evidenced by severe body fat and muscle mass depletion.  Intervention:   Add Nepro Shake po daily, each supplement provides 425 kcal and 19 grams protein, starting tomorrow per patient request. Encouraged adequate nutrition to prevent further weight loss. RD to continue to follow nutrition care plan.  Nutrition Dx:   Inadequate oral intake now related to limited appetite AEB pt report.  Goal:   Patient to meet >/= 90% of estimated nutrition needs - improving.  Monitor:   Po intake, supplement tolerance, weight trends, labs  Assessment:   48 year old male with history of ESRD and cocaine abuse who was found down by family on 2/25 seizing. EMS was called and patient was brought to the ED. Was noted that he was not protecting his airway and was intubated. PCCM was called to admit. Patient is actively seizing post intubation and after paralytics were off. Per conversation with nurse, patient is likely an active cocaine and ETOH abuser.   EEG 2/26 reveals moderately severe encephalopathy. Extubated 3/1. Advanced to Renal diet s/p extubation, enteral nutrition subsequently discontinued.  Pt reports that he is eating fair, ate about 60% of his lunch, states that he ate about the same for his breakfast. Doesn't really care for the food here. Agreeable to Nepro tomorrow, notes that he has already had a good portion of his fluid restriction amount so far today.  Potassium WNL, phosphorus elevated at 7.6.  Height: Ht Readings from Last 1 Encounters:  07/16/13 6' (1.829 m)    Weight Status:   Wt Readings from Last 1 Encounters:  07/16/13 160 lb 15 oz (73 kg)  admit weight 168 lb on 07/10/13  Re-estimated needs:  Kcal: 2000 - 2200 Protein: 90 - 105 grams Fluid: >/=1.2 L/day  Skin: no wounds  Diet Order: Renal   Intake/Output Summary (Last 24 hours) at 07/16/13  1056 Last data filed at 07/16/13 0800  Gross per 24 hour  Intake    390 ml  Output      0 ml  Net    390 ml    Last BM: 3/2   Labs:   Recent Labs Lab 07/12/13 0340 07/13/13 0415 07/14/13 0350 07/15/13 0525 07/16/13 0749  NA 139 138 141 135* 138  K 5.7* 4.1 3.7 4.0 4.8  CL 95* 92* 96 89* 89*  CO2 22 26 27 21 20   BUN 72* 45* 38* 59* 80*  CREATININE 15.28* 10.08* 8.00* 11.01* 12.86*  CALCIUM 9.7 10.1 10.1 10.4 10.6*  MG 2.7* 2.6* 2.5  --   --   PHOS 10.5* 7.3* 4.9*  --  7.6*  GLUCOSE 93 107* 96 78 96    CBG (last 3)   Recent Labs  07/15/13 0327 07/15/13 0742 07/15/13 1244  GLUCAP 81 86 115*    Scheduled Meds: . amLODipine  10 mg Oral Daily  . carvedilol  25 mg Oral 2 times per day on Sun Mon Wed Fri   And  . carvedilol  25 mg Oral Once per day on Tue Thu Sat  . doxercalciferol      . doxercalciferol  2 mcg Intravenous Q T,Th,Sa-HD  . lanthanum  1,000 mg Oral TID WC  . levETIRAcetam  500 mg Intravenous Q12H  . losartan  100 mg Oral Daily  . pantoprazole  40 mg Oral Daily  . phenytoin (DILANTIN) IV  100 mg Intravenous 3 times  per day    Continuous Infusions:    Inda Coke MS, RD, LDN Inpatient Registered Dietitian Pager: [phone] After-hours pager: [phone]

## 2013-07-16 NOTE — Progress Notes (Signed)
NEURO HOSPITALIST PROGRESS NOTE   SUBJECTIVE:                                                                                                                        Doing well. No further seizures. Had dialysis earlier today. On keppra 500 mg BID and dilantin 300 ng daily without noticeable side effects.  OBJECTIVE:                                                                                                                           Vital signs in last 24 hours: Temp:  [97.6 F (36.4 C)-98.5 F (36.9 C)] 98.2 F (36.8 C) (03/03 1440) Pulse Rate:  [92-136] 110 (03/03 1500) Resp:  [11-20] 13 (03/03 1500) BP: (131-187)/(81-128) 131/81 mmHg (03/03 1500) SpO2:  [93 %-98 %] 96 % (03/03 1500) Weight:  [68.5 kg (151 lb 0.2 oz)-73 kg (160 lb 15 oz)] 68.5 kg (151 lb 0.2 oz) (03/03 1143)  Intake/Output from previous day: 03/02 0701 - 03/03 0700 In: 800 [P.O.:590; IV Piggyback:210] Out: -  Intake/Output this shift: Total I/O In: 240 [P.O.:240] Out: 4227 [Other:4227] Nutritional status: Renal  Past Medical History  Diagnosis Date  . ESRD (end stage renal disease) on dialysis   . Headache(784.0)   . Depression   . Anxiety   Mental Status: Alert, oriented, thought content appropriate.  Speech fluent without evidence of aphasia.  Able to follow 3 step commands without difficulty. Cranial Nerves: II: Discs flat bilaterally; Visual fields grossly normal, pupils equal, round, reactive to light and accommodation III,IV, VI: ptosis not present, extra-ocular motions intact bilaterally V,VII: smile symmetric, facial light touch sensation normal bilaterally VIII: hearing normal bilaterally IX,X: gag reflex present XI: bilateral shoulder shrug XII: midline tongue extension without atrophy or fasciculations  Motor: Right : Upper extremity   5/5    Left:     Upper extremity   5/5  Lower extremity   5/5     Lower extremity   5/5 Tone and bulk:normal  tone throughout; no atrophy noted Sensory: Pinprick and light touch intact throughout, bilaterally Deep Tendon Reflexes:  Right: Upper Extremity   Left: Upper extremity   biceps (C-5 to C-6) 2/4   biceps (C-5 to C-6)  2/4 tricep (C7) 2/4    triceps (C7) 2/4 Brachioradialis (C6) 2/4  Brachioradialis (C6) 2/4  Lower Extremity Lower Extremity  quadriceps (L-2 to L-4) 2/4   quadriceps (L-2 to L-4) 2/4 Achilles (S1) 2/4   Achilles (S1) 2/4  Plantars: Right: downgoing   Left: downgoing Cerebellar: normal finger-to-nose,  normal heel-to-shin test Gait:  No ataxia. CV: pulses palpable throughout    Neurologic Exam:    Lab Results: No results found for this basename: cbc, bmp, coags, chol, tri, ldl, hga1c   Lipid Panel No results found for this basename: CHOL, TRIG, HDL, CHOLHDL, VLDL, LDLCALC,  in the last 72 hours  Studies/Results: Dg Chest Port 1 View  07/15/2013   CLINICAL DATA:  Extubation.  End-stage renal disease.  EXAM: PORTABLE CHEST - 1 VIEW  COMPARISON:  DG CHEST 1V PORT dated 07/14/2013; DG CHEST 1V PORT dated 07/13/2013  FINDINGS: Endotracheal tube removed.  Nasogastric tube removed.  Left IJ line tip:  SVC.  Mild cardiomegaly. Mild airway thickening noted. Upper zone vascular prominence could be positional or due to pulmonary venous hypertension.  IMPRESSION: 1. Airway thickening may reflect bronchitis or reactive airways disease. 2. Bowel cardiomegaly. Possible pulmonary venous hypertension. No overt edema.   Electronically Signed   By: Sherryl Barters M.D.   On: 07/15/2013 07:58    MEDICATIONS                                                                                                                       I have reviewed the patient's current medications.  ASSESSMENT/PLAN:                                                                                                           PRES with refractory GTC SE, resolved. Although he most likely had provoked SE in the context  pf PRES, will suggest maintaining current AED regimen for the next few weeks and making a decision about weaning him off anticonvulsants in the outpatient setting. Neurology will sign off.  Dorian Pod, MD [facility] [phone]  07/16/2013, 5:09 PM

## 2013-07-16 NOTE — Procedures (Signed)
Patient was seen on dialysis and the procedure was supervised.  BFR 400  Via AVF BP is  180/110.   Patient appears to be tolerating treatment well- large goal to try and get BP down  Jeremy Mccoy A 07/16/2013

## 2013-07-16 NOTE — Progress Notes (Signed)
Jeremy Mussel, RN dialysis: if possible to give keppra IVPB dose in dialysis during treatment. Cyril Mourning will double check and call back. Returned call, hold keppra until after dialysis per MD order. Will give keppra upon return to unit.

## 2013-07-16 NOTE — Progress Notes (Signed)
Utilization review completed.  

## 2013-07-16 NOTE — Progress Notes (Signed)
Pt in dialysis. RN introduced self to patient as dialysis staff transferring pt by bed to dialysis. CCMT notified. Attempted to contact eICU. Awaiting return of patient for assessment.

## 2013-07-16 NOTE — Progress Notes (Signed)
MD called regarding elevated BP and HR, currently BP=180/108 after giving 40mg  of Hydralazine. Pt asymptomatic, no complaints of pain or change in pt status. Orders received for Labetalol PRN, BP=159/101 post treatment. Will continue monitoring

## 2013-07-16 NOTE — Progress Notes (Signed)
Physical Therapy Treatment Patient Details Name: Jeremy Mccoy MRN: [id number] DOB: 09-02-1965 Today's Date: 07/16/2013 Time: [id number] PT Time Calculation (min): 19 min  PT Assessment / Plan / Recommendation  History of Present Illness Pt on ESRD with cocaine abuse. Family found seizing   PT Comments   Pt with improved ambulation tolerance however con't to require 24/7 supervision for safe d/c home for safety due to decreased insight to deficits and decreased safety awareness.     Follow Up Recommendations  Supervision/Assistance - 24 hour     Does the patient have the potential to tolerate intense rehabilitation     Barriers to Discharge        Equipment Recommendations  None recommended by PT    Recommendations for Other Services    Frequency Min 3X/week   Progress towards PT Goals Progress towards PT goals: Progressing toward goals  Plan Current plan remains appropriate    Precautions / Restrictions Precautions Precautions: Fall Restrictions Weight Bearing Restrictions: No   Pertinent Vitals/Pain Denies pain    Mobility  Bed Mobility Overal bed mobility: Modified Independent Bed Mobility: Supine to Sit General bed mobility comments: v/c's for safety Transfers Overall transfer level: Needs assistance Equipment used: None Transfers: Sit to/from Stand Sit to Stand: Supervision General transfer comment: v/c's for safety, v/c's to decreased pace due to impulsivity Ambulation/Gait Ambulation/Gait assistance: Min guard Ambulation Distance (Feet): 300 Feet Assistive device: None Gait Pattern/deviations: Step-through pattern General Gait Details: pt with lateral sway L/R. no episodes of significant LOB but mildly unsteady    Exercises     PT Diagnosis:    PT Problem List:   PT Treatment Interventions:     PT Goals (current goals can now be found in the care plan section) Acute Rehab PT Goals Patient Stated Goal: home  Visit Information  Last PT Received  On: 07/16/13 Assistance Needed: +1 History of Present Illness: Pt on ESRD with cocaine abuse. Family found seizing    Subjective Data  Patient Stated Goal: home   Cognition  Cognition Arousal/Alertness: Awake/alert Behavior During Therapy: Flat affect Overall Cognitive Status: Impaired/Different from baseline Area of Impairment: Safety/judgement Safety/Judgement: Decreased awareness of safety    Balance  Balance Overall balance assessment: Needs assistance Standing balance support: No upper extremity supported Standing balance-Leahy Scale: Fair  End of Session PT - End of Session Equipment Utilized During Treatment: Gait belt Activity Tolerance: Patient tolerated treatment well Patient left: in bed;with chair alarm set   GP     [name] 07/16/2013, 2:47 PM  Kittie Plater, PT, DPT Pager #: [phone] Office #: [phone]

## 2013-07-16 NOTE — Progress Notes (Signed)
Pharmacy consulted to dose LMWH for VTE px. Pt ESRD pt on HD.  Wt 86.5 kg. Plan: LMWH 30 q24 Eudelia Bunch, Pharm.D. [id number] 07/16/2013 1:53 PM

## 2013-07-17 LAB — PHENYTOIN LEVEL, TOTAL: PHENYTOIN LVL: 3.6 ug/mL — AB (ref 10.0–20.0)

## 2013-07-17 MED ORDER — CALCIUM CARBONATE ANTACID 500 MG PO CHEW
200.0000 mg | CHEWABLE_TABLET | Freq: Three times a day (TID) | ORAL | Status: DC | PRN
  Administered 2013-07-17 – 2013-07-18 (×2): 200 mg via ORAL
  Filled 2013-07-17: qty 1

## 2013-07-17 MED ORDER — NEPRO/CARBSTEADY PO LIQD
237.0000 mL | ORAL | Status: DC
  Administered 2013-07-18: 237 mL via ORAL
  Filled 2013-07-17 (×2): qty 237

## 2013-07-17 MED ORDER — LANTHANUM CARBONATE 500 MG PO CHEW
1000.0000 mg | CHEWABLE_TABLET | Freq: Three times a day (TID) | ORAL | Status: DC
  Administered 2013-07-17 – 2013-07-19 (×7): 1000 mg via ORAL
  Filled 2013-07-17 (×9): qty 2

## 2013-07-17 NOTE — Progress Notes (Signed)
CSW attempted to assess for substance abuse and 24 hour supervision as recommended by PT. Patient was asleep and would not arouse when CSW attempted to wake patient. CSW will attempt to assess at later time.  Tilden Fossa, MSW, [place] Social Worker [phone]

## 2013-07-17 NOTE — Progress Notes (Signed)
  Jeremy Mccoy Progress Note   Subjective: seen on dialysis polite- BP better after HD- reports dizziness  Filed Vitals:   07/17/13 0400 07/17/13 0700 07/17/13 0708 07/17/13 0922  BP:   137/91 141/89  Pulse:   102 97  Temp: 98.3 F (36.8 C) 99.2 F (37.3 C)    TempSrc: Oral Oral    Resp:   20   Height:      Weight: 69.2 kg (152 lb 8.9 oz)     SpO2:   95% 95%  Exam: Lethargic, awake  No jvd  Chest is clear bilat  RRR no MRG  Abd scaphoid, nd, +bs  No LE or UE edema L forearm AVF patent Neuro- on vent, sedated  CXR- no sig dz  Dialysis: TTS [facility]  4hr 53min 71kg 1K/2.5 Bath Heparin none LFA AVF  Hectorol 4  Labs: tsat 27% pth 369   Assessment:  1 Seizures / status epilepticus- d/t uremia   (Cr 20) + malignant HTN, better- on dilantin- per neuro- to keep on for several wees 2 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, due to malignant HTN- seems to be improving 3 ESRD on hemodialysis- TTS via AVF- volume removal to help with BP- no heparin for now 4 Pulm edema- resolved 5 HTN-  on norvasc 10 , coreg 25 BID- and losartan 100- better after HD - i told him his body just needs to get used to a better BP- new edw around 68.5 6 Anemia- Hb 12, no esa  7 2HPT-  fosrenol-  Have decreased hectorol due to high calcium  7 Hyperkalemia- resolved 8 Fevers- off antibiotics 9 Noncompliance with dialysis- hoping this will prove a valuable lesson 10. Dispo- get OOB  to ambulate, hopefully home soon- PT still rec 24 hour supervision for safety - is going to be going home with parents, they will give him ride to HD      Bells A   07/17/2013, 10:54 AM     Recent Labs Lab 07/13/13 0415 07/14/13 0350 07/15/13 0525 07/16/13 0749  NA 138 141 135* 138  K 4.1 3.7 4.0 4.8  CL 92* 96 89* 89*  CO2 26 27 21 20   GLUCOSE 107* 96 78 96  BUN 45* 38* 59* 80*  CREATININE 10.08* 8.00* 11.01* 12.86*  CALCIUM 10.1 10.1 10.4 10.6*  PHOS 7.3* 4.9*  --  7.6*    Recent Labs Lab  07/13/13 0415 07/16/13 0749  ALBUMIN 3.3* 3.6    Recent Labs Lab 07/14/13 0350 07/15/13 0525 07/16/13 0749  WBC 4.8 7.2 9.0  HGB 11.7* 12.1* 12.8*  HCT 36.1* 36.3* 37.8*  MCV 94.8 94.3 92.6  PLT 118* 149* 186   . amLODipine  10 mg Oral Daily  . carvedilol  25 mg Oral 2 times per day on Sun Mon Wed Fri   And  . carvedilol  25 mg Oral Once per day on Tue Thu Sat  . doxercalciferol  2 mcg Intravenous Q T,Th,Sa-HD  . enoxaparin (LOVENOX) injection  30 mg Subcutaneous Q24H  . feeding supplement (NEPRO CARB STEADY)  237 mL Oral Q24H  . lanthanum  1,000 mg Oral TID WC  . levETIRAcetam  500 mg Oral BID  . losartan  100 mg Oral Daily  . pantoprazole  40 mg Oral Daily  . phenytoin  100 mg Oral TID     acetaminophen, calcium carbonate, labetalol, ondansetron (ZOFRAN) IV

## 2013-07-17 NOTE — Progress Notes (Signed)
Progress Note [place] TEAM 1 - Stepdown/ICU TEAM   Jeremy Mccoy [id number] DOB: [birth year] DOA: 07/10/2013 PCP: No PCP Per Patient  Brief narrative: 48 y.o. male admitted on 07/10/2013 with a past medical history of ESRD on dialysis, cocaine abuse, depression; and anxiety w/ c/o a seizure.  He was brought in by family. He was intubated as he continued to seize.  SIGNIFICANT EVENTS / STUDIES:  2/25 seizure > intubated  2/28 off pentobarb and on diprivan.  ET tube 2/25>>>3/1  L IJ TLC 2/25>>>3/2 (planned d/c after PIV established)  R radial a-line 2/25>>>out  Subjective: Patient is alert and interactive.  He has no complaints today.  He denies chest pain or shortness of breath.  Assessment/Plan:  Status epilepticus - due to PRES + uremia - cont Keppra and Dilantin per Neuro ("Although he most likely had provoked SE in the context of PRES, will suggest maintaining current AED regimen for the next few weeks and making a decision about weaning him off anticonvulsants in the outpatient setting.")  PRES - Malignant HTN - BP much better controlled - mental status improving - no changes in tx plan today - cont meds + HD   ESRD - Non-compliance with T/Th/S dialysis - cont hemodialysis - Nephrology following   Pulmonary edema  - due to HTN and missed HD - resolved   Acute respiratory failure - due to inability to protect airway due to Sz - extubated 07/14/2013  Possible aspiration - Klebsiella from trach aspirate - has completed a course of abx - no evidence of infection at present   Protein-calorie malnutrition, severe - does not like the hospital food  Code Status: FULL Family Communication: no family present at time of exam  Disposition Plan: transfer to renal bed - home in next 24-48hrs if BP stable/neuro status stable 0  Consultants: Nephro Neurology  Antibiotics: Unasyn 2/25>>>2/27  Ceftriax 2/28 >>>3/01  DVT prophylaxis: lovenox  Objective: Filed Weights     07/16/13 0740 07/16/13 1143 07/17/13 0400  Weight: 73 kg (160 lb 15 oz) 68.5 kg (151 lb 0.2 oz) 69.2 kg (152 lb 8.9 oz)   Blood pressure 136/90, pulse 89, temperature 98.2 F (36.8 C), temperature source Oral, resp. rate 20, height 6' (1.829 m), weight 69.2 kg (152 lb 8.9 oz), SpO2 95.00%.  Intake/Output Summary (Last 24 hours) at 07/17/13 1347 Last data filed at 07/17/13 0708  Gross per 24 hour  Intake    600 ml  Output      0 ml  Net    600 ml    Exam: General: No acute respiratory distress at rest  Lungs: Clear to auscultation bilaterally without wheezes or crackles Cardiovascular: Regular rate and rhythm without murmur gallop or rub normal S1 and S2- HR in110s Abdomen: Nontender, nondistended, soft, bowel sounds positive, no rebound, no ascites, no appreciable mass Extremities: No significant cyanosis, clubbing, or edema bilateral lower extremities  Data Reviewed: Basic Metabolic Panel:  Recent Labs Lab 07/11/13 0500 07/12/13 0340 07/13/13 0415 07/14/13 0350 07/15/13 0525 07/16/13 0749  NA 142 139 138 141 135* 138  K 4.8 5.7* 4.1 3.7 4.0 4.8  CL 102 95* 92* 96 89* 89*  CO2 23 22 26 27 21 20   GLUCOSE 110* 93 107* 96 78 96  BUN 50* 72* 45* 38* 59* 80*  CREATININE 12.17* 15.28* 10.08* 8.00* 11.01* 12.86*  CALCIUM 8.3* 9.7 10.1 10.1 10.4 10.6*  MG 2.4 2.7* 2.6* 2.5  --   --   PHOS 9.6*  10.5* 7.3* 4.9*  --  7.6*   Liver Function Tests:  Recent Labs Lab 07/13/13 0415 07/16/13 0749  ALBUMIN 3.3* 3.6   CBC:  Recent Labs Lab 07/12/13 0340 07/13/13 0415 07/14/13 0350 07/15/13 0525 07/16/13 0749  WBC 5.2 6.3 4.8 7.2 9.0  HGB 11.2* 11.7* 11.7* 12.1* 12.8*  HCT 33.4* 35.6* 36.1* 36.3* 37.8*  MCV 92.8 93.7 94.8 94.3 92.6  PLT 83* 111* 118* 149* 186   CBG:  Recent Labs Lab 07/14/13 2028 07/14/13 2347 07/15/13 0327 07/15/13 0742 07/15/13 1244  GLUCAP 78 86 81 86 115*    Recent Results (from the past 240 hour(s))  CULTURE, BLOOD (ROUTINE X 2)      Status: None   Collection Time    07/10/13  6:52 AM      Result Value Ref Range Status   Specimen Description BLOOD RIGHT ANTECUBITAL   Final   Special Requests BOTTLES DRAWN AEROBIC AND ANAEROBIC 10CC   Final   Culture  Setup Time     Final   Value: 07/10/2013 12:40     Performed at [facility]   Culture     Final   Value: NO GROWTH 5 DAYS     Performed at [facility]   Report Status 07/16/2013 FINAL   Final  CULTURE, BLOOD (ROUTINE X 2)     Status: None   Collection Time    07/10/13  6:59 AM      Result Value Ref Range Status   Specimen Description BLOOD RIGHT FOREARM   Final   Special Requests BOTTLES DRAWN AEROBIC AND ANAEROBIC 10CC   Final   Culture  Setup Time     Final   Value: 07/10/2013 12:40     Performed at [facility]   Culture     Final   Value: NO GROWTH 5 DAYS     Performed at [facility]   Report Status 07/16/2013 FINAL   Final  MRSA PCR SCREENING     Status: None   Collection Time    07/10/13  9:39 AM      Result Value Ref Range Status   MRSA by PCR NEGATIVE  NEGATIVE Final   Comment:            The GeneXpert MRSA Assay (FDA     approved for NASAL specimens     only), is one component of a     comprehensive MRSA colonization     surveillance program. It is not     intended to diagnose MRSA     infection nor to guide or     monitor treatment for     MRSA infections.  CULTURE, RESPIRATORY (NON-EXPECTORATED)     Status: None   Collection Time    07/10/13  1:44 PM      Result Value Ref Range Status   Specimen Description ENDOTRACHEAL   Final   Special Requests TRACHEAL ASPIRATE   Final   Gram Stain     Final   Value: MODERATE WBC PRESENT, PREDOMINANTLY PMN     RARE SQUAMOUS EPITHELIAL CELLS PRESENT     FEW GRAM POSITIVE COCCI     IN CLUSTERS FEW GRAM NEGATIVE RODS     Performed at [facility]   Culture     Final   Value: ABUNDANT KLEBSIELLA OXYTOCA     Performed at [facility]   Report Status  07/13/2013 FINAL   Final   Organism ID,  Bacteria KLEBSIELLA OXYTOCA   Final     Studies:  Recent x-ray studies have been reviewed in detail by the Attending Physician  Scheduled Meds:  Scheduled Meds: . amLODipine  10 mg Oral Daily  . carvedilol  25 mg Oral 2 times per day on Sun Mon Wed Fri   And  . carvedilol  25 mg Oral Once per day on Tue Thu Sat  . doxercalciferol  2 mcg Intravenous Q T,Th,Sa-HD  . enoxaparin (LOVENOX) injection  30 mg Subcutaneous Q24H  . feeding supplement (NEPRO CARB STEADY)  237 mL Oral Q24H  . lanthanum  1,000 mg Oral TID WC  . levETIRAcetam  500 mg Oral BID  . losartan  100 mg Oral Daily  . pantoprazole  40 mg Oral Daily  . phenytoin  100 mg Oral TID    Time spent on care of this patient: 35 min   MCCLUNG,JEFFREY T, MD  [facility] Office  [phone] Pager - Text Page per Shea Evans as per below:  On-Call/Text Page:      [email or web address]  If 7PM-7AM, please contact night-coverage [email or web address] 07/17/2013, 1:47 PM   LOS: 7 days

## 2013-07-18 LAB — CBC
HEMATOCRIT: 36.2 % — AB (ref 39.0–52.0)
HEMOGLOBIN: 12.3 g/dL — AB (ref 13.0–17.0)
MCH: 30.9 pg (ref 26.0–34.0)
MCHC: 34 g/dL (ref 30.0–36.0)
MCV: 91 fL (ref 78.0–100.0)
Platelets: 189 10*3/uL (ref 150–400)
RBC: 3.98 MIL/uL — ABNORMAL LOW (ref 4.22–5.81)
RDW: 13.8 % (ref 11.5–15.5)
WBC: 5.6 10*3/uL (ref 4.0–10.5)

## 2013-07-18 LAB — RENAL FUNCTION PANEL
ALBUMIN: 3.5 g/dL (ref 3.5–5.2)
BUN: 95 mg/dL — ABNORMAL HIGH (ref 6–23)
CHLORIDE: 84 meq/L — AB (ref 96–112)
CO2: 24 mEq/L (ref 19–32)
Calcium: 10.2 mg/dL (ref 8.4–10.5)
Creatinine, Ser: 12.42 mg/dL — ABNORMAL HIGH (ref 0.50–1.35)
GFR calc Af Amer: 5 mL/min — ABNORMAL LOW (ref >90)
GFR calc non Af Amer: 4 mL/min — ABNORMAL LOW (ref >90)
GLUCOSE: 133 mg/dL — AB (ref 70–99)
POTASSIUM: 4.9 meq/L (ref 3.7–5.3)
Phosphorus: 6.4 mg/dL — ABNORMAL HIGH (ref 2.3–4.6)
Sodium: 132 mEq/L — ABNORMAL LOW (ref 137–147)

## 2013-07-18 MED ORDER — NEPRO/CARBSTEADY PO LIQD
237.0000 mL | Freq: Four times a day (QID) | ORAL | Status: DC
  Administered 2013-07-18 – 2013-07-19 (×3): 237 mL via ORAL

## 2013-07-18 MED ORDER — DOXERCALCIFEROL 4 MCG/2ML IV SOLN
INTRAVENOUS | Status: AC
  Filled 2013-07-18: qty 2

## 2013-07-18 MED ORDER — FAMOTIDINE IN NACL 20-0.9 MG/50ML-% IV SOLN
20.0000 mg | Freq: Two times a day (BID) | INTRAVENOUS | Status: DC
  Administered 2013-07-18 – 2013-07-19 (×3): 20 mg via INTRAVENOUS
  Filled 2013-07-18 (×4): qty 50

## 2013-07-18 MED ORDER — ACETAMINOPHEN 325 MG PO TABS
ORAL_TABLET | ORAL | Status: AC
  Administered 2013-07-18: 16:00:00
  Filled 2013-07-18: qty 2

## 2013-07-18 MED ORDER — CALCIUM CARBONATE ANTACID 500 MG PO CHEW
CHEWABLE_TABLET | ORAL | Status: AC
  Administered 2013-07-18: 16:00:00
  Filled 2013-07-18: qty 2

## 2013-07-18 MED ORDER — PHENYTOIN SODIUM EXTENDED 100 MG PO CAPS
200.0000 mg | ORAL_CAPSULE | Freq: Three times a day (TID) | ORAL | Status: DC
  Administered 2013-07-18 – 2013-07-19 (×4): 200 mg via ORAL
  Filled 2013-07-18 (×5): qty 2

## 2013-07-18 MED ORDER — PANTOPRAZOLE SODIUM 40 MG IV SOLR
40.0000 mg | Freq: Two times a day (BID) | INTRAVENOUS | Status: DC
Start: 2013-07-18 — End: 2013-07-19
  Administered 2013-07-18 – 2013-07-19 (×2): 40 mg via INTRAVENOUS
  Filled 2013-07-18 (×3): qty 40

## 2013-07-18 MED ORDER — PHENYTOIN 50 MG PO CHEW
400.0000 mg | CHEWABLE_TABLET | Freq: Once | ORAL | Status: AC
  Administered 2013-07-18: 400 mg via ORAL
  Filled 2013-07-18: qty 8

## 2013-07-18 NOTE — Progress Notes (Signed)
Physical Therapy Treatment Patient Details Name: Jeremy Mccoy MRN: [id number] DOB: Sep 25, 1965 Today's Date: 07/18/2013 Time: [id number] PT Time Calculation (min): 32 min  PT Assessment / Plan / Recommendation  History of Present Illness Pt on ESRD with cocaine abuse. Family found seizing   PT Comments   Progressing with activity tolerance and balance.  Still slow to process commands, but does follow 2 step commands and seems well aware of events of hospitalization.  He has family support so should be safe to d/c home with family assist.  Follow Up Recommendations  Supervision/Assistance - 24 hour           Equipment Recommendations  None recommended by PT    Recommendations for Other Services  None  Frequency Min 3X/week   Progress towards PT Goals Progress towards PT goals: Progressing toward goals  Plan Current plan remains appropriate    Precautions / Restrictions Precautions Precautions: Fall Precaution Comments: seizure Restrictions Weight Bearing Restrictions: No   Pertinent Vitals/Pain Min c/o left foot numbness    Mobility  Bed Mobility Overal bed mobility: Modified Independent Bed Mobility: Supine to Sit Transfers Overall transfer level: Needs assistance Transfers: Sit to/from Stand Sit to Stand: Supervision General transfer comment: for safety Ambulation/Gait Ambulation/Gait assistance: Min guard;Supervision Ambulation Distance (Feet): 300 Feet Assistive device: None Gait Pattern/deviations: Step-through pattern General Gait Details: slight right pelvic retraction, increased base of support Stairs: Yes Stairs assistance: Min guard Stair Management: One rail Left;Alternating pattern;Forwards Number of Stairs: 10 General stair comments: slow pace, LE weakness evident, but pt continues to self select alternating pattern    Exercises General Exercises - Lower Extremity Toe Raises: AROM;Standing;Both;10 reps Heel Raises: AROM;Standing;Both;10 reps      PT Goals (current goals can now be found in the care plan section)    Visit Information  Last PT Received On: 07/18/13 Assistance Needed: +1 History of Present Illness: Pt on ESRD with cocaine abuse. Family found seizing    Subjective Data   I know not to do anything illegal anymore   Cognition  Cognition Arousal/Alertness: Awake/alert Behavior During Therapy: WFL for tasks assessed/performed Overall Cognitive Status: Within Functional Limits for tasks assessed Current Attention Level: Selective Problem Solving: Slow processing    Balance  Balance Sitting-balance support: Feet supported Sitting balance-Leahy Scale: Good Standing balance support: No upper extremity supported Standing balance-Leahy Scale: Good Standing balance comment: balances statically and for some dynamic activities without UE support General Comments General comments (skin integrity, edema, etc.): high level balance activities to include: side stepping, forward tandem, marching in place, braiding and backwards walking  End of Session PT - End of Session Equipment Utilized During Treatment: Gait belt Activity Tolerance: Patient tolerated treatment well Patient left: in chair;with call bell/phone within reach;with chair alarm set   GP     [facility] 07/18/2013, 10:39 AM Magda Kiel, [place] 07/18/2013

## 2013-07-18 NOTE — Progress Notes (Addendum)
Progress Note [place] TEAM 1 - Stepdown/ICU TEAM   Jeremy Mccoy [id number] DOB: 08-08-1965 DOA: 07/10/2013 PCP: No PCP Per Patient  Brief narrative: 48 y.o. male admitted on 07/10/2013 with a past medical history of ESRD on dialysis, cocaine abuse, depression; and anxiety w/ c/o a seizure.  He was brought in by family. He was intubated as he continued to seize.  SIGNIFICANT EVENTS / STUDIES:  2/25 seizure > intubated  2/28 off pentobarb and on diprivan.  ET tube 2/25>>>3/1  L IJ TLC 2/25>>>3/2 (planned d/c after PIV established)  R radial a-line 2/25>>>out  Subjective: Poor appetitie- has only taken a few bites of his breakfast and lunch- confirmed by nursing- was retching yesterday- also feels pain in upper chest when he swallows and "acidity" in lower chest. Has drank Nepro but this has been only ordered daily.   Assessment/Plan:  Status epilepticus - due to PRES + uremia - cont Keppra and Dilantin per Neuro ("Although he most likely had provoked SE in the context of PRES, will suggest maintaining current AED regimen for the next few weeks and making a decision about weaning him off anticonvulsants in the outpatient setting.") - Dilantin level quite low therefore will increase to 200 BID after a bolus of 400 mg PO once  PRES - Malignant HTN - BP high esp diastolic component today but has not received his meds as he will be going to dialysis - mental status improving - no changes in tx plan today - cont meds + HD   ESRD - Non-compliance with T/Th/S dialysis - cont hemodialysis - Nephrology following   Pulmonary edema  - due to HTN and missed HD - resolved   Acute respiratory failure - due to inability to protect airway due to Sz - extubated 07/14/2013  Possible aspiration - Klebsiella from trach aspirate - has completed a course of abx - no evidence of infection at present   Protein-calorie malnutrition, severe - does not like the hospital food- increase Nepro to  QID- add Pepcid and Protonix BID-   Dysphagia - likely due to ET tube- monitor  Code Status: FULL Family Communication: no family present at time of exam  Disposition Plan: transfer to renal bed - home in next 24-48hrs if BP stable/neuro status stable 0  Consultants: Nephro Neurology  Antibiotics: Unasyn 2/25>>>2/27  Ceftriax 2/28 >>>3/01  DVT prophylaxis: lovenox  Objective: Filed Weights   07/16/13 1143 07/17/13 0400 07/18/13 1230  Weight: 68.5 kg (151 lb 0.2 oz) 69.2 kg (152 lb 8.9 oz) 67.2 kg (148 lb 2.4 oz)   Blood pressure 148/100, pulse 91, temperature 98.1 F (36.7 C), temperature source Oral, resp. rate 18, height 6' (1.829 m), weight 67.2 kg (148 lb 2.4 oz), SpO2 98.00%.  Intake/Output Summary (Last 24 hours) at 07/18/13 1444 Last data filed at 07/18/13 1316  Gross per 24 hour  Intake    480 ml  Output      0 ml  Net    480 ml    Exam: General: No acute respiratory distress at rest  Lungs: Clear to auscultation bilaterally without wheezes or crackles Cardiovascular: Regular rate and rhythm without murmur gallop or rub normal S1 and S2- Abdomen: Nontender, nondistended, soft, bowel sounds positive, no rebound, no ascites, no appreciable mass Extremities: No significant cyanosis, clubbing, or edema bilateral lower extremities  Data Reviewed: Basic Metabolic Panel:  Recent Labs Lab 07/12/13 0340 07/13/13 0415 07/14/13 0350 07/15/13 0525 07/16/13 0749 07/18/13 1258  NA 139 138 141  135* 138 132*  K 5.7* 4.1 3.7 4.0 4.8 4.9  CL 95* 92* 96 89* 89* 84*  CO2 22 26 27 21 20 24   GLUCOSE 93 107* 96 78 96 133*  BUN 72* 45* 38* 59* 80* 95*  CREATININE 15.28* 10.08* 8.00* 11.01* 12.86* 12.42*  CALCIUM 9.7 10.1 10.1 10.4 10.6* 10.2  MG 2.7* 2.6* 2.5  --   --   --   PHOS 10.5* 7.3* 4.9*  --  7.6* 6.4*   Liver Function Tests:  Recent Labs Lab 07/13/13 0415 07/16/13 0749 07/18/13 1258  ALBUMIN 3.3* 3.6 3.5   CBC:  Recent Labs Lab 07/13/13 0415  07/14/13 0350 07/15/13 0525 07/16/13 0749 07/18/13 1258  WBC 6.3 4.8 7.2 9.0 5.6  HGB 11.7* 11.7* 12.1* 12.8* 12.3*  HCT 35.6* 36.1* 36.3* 37.8* 36.2*  MCV 93.7 94.8 94.3 92.6 91.0  PLT 111* 118* 149* 186 189   CBG:  Recent Labs Lab 07/14/13 2028 07/14/13 2347 07/15/13 0327 07/15/13 0742 07/15/13 1244  GLUCAP 78 86 81 86 115*    Recent Results (from the past 240 hour(s))  CULTURE, BLOOD (ROUTINE X 2)     Status: None   Collection Time    07/10/13  6:52 AM      Result Value Ref Range Status   Specimen Description BLOOD RIGHT ANTECUBITAL   Final   Special Requests BOTTLES DRAWN AEROBIC AND ANAEROBIC 10CC   Final   Culture  Setup Time     Final   Value: 07/10/2013 12:40     Performed at [facility]   Culture     Final   Value: NO GROWTH 5 DAYS     Performed at [facility]   Report Status 07/16/2013 FINAL   Final  CULTURE, BLOOD (ROUTINE X 2)     Status: None   Collection Time    07/10/13  6:59 AM      Result Value Ref Range Status   Specimen Description BLOOD RIGHT FOREARM   Final   Special Requests BOTTLES DRAWN AEROBIC AND ANAEROBIC 10CC   Final   Culture  Setup Time     Final   Value: 07/10/2013 12:40     Performed at [facility]   Culture     Final   Value: NO GROWTH 5 DAYS     Performed at [facility]   Report Status 07/16/2013 FINAL   Final  MRSA PCR SCREENING     Status: None   Collection Time    07/10/13  9:39 AM      Result Value Ref Range Status   MRSA by PCR NEGATIVE  NEGATIVE Final   Comment:            The GeneXpert MRSA Assay (FDA     approved for NASAL specimens     only), is one component of a     comprehensive MRSA colonization     surveillance program. It is not     intended to diagnose MRSA     infection nor to guide or     monitor treatment for     MRSA infections.  CULTURE, RESPIRATORY (NON-EXPECTORATED)     Status: None   Collection Time    07/10/13  1:44 PM      Result Value Ref Range Status    Specimen Description ENDOTRACHEAL   Final   Special Requests TRACHEAL ASPIRATE   Final   Gram Stain     Final  Value: MODERATE WBC PRESENT, PREDOMINANTLY PMN     RARE SQUAMOUS EPITHELIAL CELLS PRESENT     FEW GRAM POSITIVE COCCI     IN CLUSTERS FEW GRAM NEGATIVE RODS     Performed at [facility]   Culture     Final   Value: ABUNDANT KLEBSIELLA OXYTOCA     Performed at [facility]   Report Status 07/13/2013 FINAL   Final   Organism ID, Bacteria KLEBSIELLA OXYTOCA   Final     Studies:  Recent x-ray studies have been reviewed in detail by the Attending Physician  Scheduled Meds:  Scheduled Meds: . amLODipine  10 mg Oral Daily  . carvedilol  25 mg Oral 2 times per day on Sun Mon Wed Fri   And  . carvedilol  25 mg Oral Once per day on Tue Thu Sat  . doxercalciferol      . doxercalciferol  2 mcg Intravenous Q T,Th,Sa-HD  . enoxaparin (LOVENOX) injection  30 mg Subcutaneous Q24H  . famotidine (PEPCID) IV  20 mg Intravenous Q12H  . feeding supplement (NEPRO CARB STEADY)  237 mL Oral QID  . lanthanum  1,000 mg Oral TID WC  . levETIRAcetam  500 mg Oral BID  . losartan  100 mg Oral Daily  . pantoprazole (PROTONIX) IV  40 mg Intravenous Q12H  . phenytoin  200 mg Oral TID    Time spent on care of this patient: 25 min   Debbe Odea, MD  [facility] Office  [phone] Pager - Text Page per Shea Evans as per below:  On-Call/Text Page:      [email or web address]  If 7PM-7AM, please contact night-coverage [email or web address] 07/18/2013, 2:44 PM   LOS: 8 days

## 2013-07-18 NOTE — Progress Notes (Signed)
  Jeremy Mccoy Progress Note   Subjective: no new complaints, up on floor bed, "PT is tougher than I thought it would be"  Filed Vitals:   07/18/13 0010 07/18/13 0237 07/18/13 0606 07/18/13 0735  BP: 152/101 160/104 158/102 169/109  Pulse: 89 91 87 98  Temp: 98.8 F (37.1 C) 98.4 F (36.9 C) 98.6 F (37 C) 98 F (36.7 C)  TempSrc: Oral Oral Oral Oral  Resp: 18 18 18 20   Height:      Weight:      SpO2: 98% 96% 94% 95%  Exam: Lethargic, awake  No jvd  Chest is clear bilat  RRR no MRG  Abd scaphoid, nd, +bs  No LE or UE edema L forearm AVF patent Neuro- alert  CXR- no sig dz  Dialysis: TTS [facility]  4hr 75min 71kg 1K/2.5 [place] Heparin none LFA AVF  Hectorol 4  Labs: tsat 27% pth 369   Assessment:  1 Seizures / status epilepticus- d/t uremia   (Cr 20) + malignant HTN, better- on dilantin- per neuro- to keep on for several wees 2 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, due to malignant HTN- seems to be improving 3 ESRD on hemodialysis- TTS via AVF- volume removal to help with BP- no heparin for now 4 Pulm edema- resolved 5 HTN-  on norvasc 10 , coreg 25 BID- and losartan 100- better after HD - i told him his body just needs to get used to Mccoy better BP- new edw around 68.5 6 Anemia- Hb 12, no esa  7 2HPT-  fosrenol-  Have decreased hectorol due to high calcium  7 Hyperkalemia- resolved 8 Fevers- off antibiotics 9 Noncompliance with dialysis- hoping this will prove Mccoy valuable lesson 10. Dispo- get OOB  to ambulate, hopefully home soon- PT still rec 24 hour supervision for safety - is going to be going home with parents, they will give him ride to HD      Jeremy Mccoy   07/18/2013, 10:17 AM     Recent Labs Lab 07/13/13 0415 07/14/13 0350 07/15/13 0525 07/16/13 0749  NA 138 141 135* 138  K 4.1 3.7 4.0 4.8  CL 92* 96 89* 89*  CO2 26 27 21 20   GLUCOSE 107* 96 78 96  BUN 45* 38* 59* 80*  CREATININE 10.08* 8.00* 11.01* 12.86*  CALCIUM 10.1 10.1 10.4 10.6*  PHOS  7.3* 4.9*  --  7.6*    Recent Labs Lab 07/13/13 0415 07/16/13 0749  ALBUMIN 3.3* 3.6    Recent Labs Lab 07/14/13 0350 07/15/13 0525 07/16/13 0749  WBC 4.8 7.2 9.0  HGB 11.7* 12.1* 12.8*  HCT 36.1* 36.3* 37.8*  MCV 94.8 94.3 92.6  PLT 118* 149* 186   . amLODipine  10 mg Oral Daily  . carvedilol  25 mg Oral 2 times per day on Sun Mon Wed Fri   And  . carvedilol  25 mg Oral Once per day on Tue Thu Sat  . doxercalciferol  2 mcg Intravenous Q T,Th,Sa-HD  . enoxaparin (LOVENOX) injection  30 mg Subcutaneous Q24H  . feeding supplement (NEPRO CARB STEADY)  237 mL Oral Q24H  . lanthanum  1,000 mg Oral TID WC  . levETIRAcetam  500 mg Oral BID  . losartan  100 mg Oral Daily  . pantoprazole  40 mg Oral Daily  . phenytoin  100 mg Oral TID     acetaminophen, calcium carbonate, labetalol, ondansetron (ZOFRAN) IV

## 2013-07-19 MED ORDER — LEVETIRACETAM 500 MG PO TABS: 500.0000 mg | ORAL_TABLET | Freq: Two times a day (BID) | ORAL | Status: DC

## 2013-07-19 MED ORDER — NEPRO/CARBSTEADY PO LIQD: 237.0000 mL | Freq: Four times a day (QID) | ORAL | Status: DC

## 2013-07-19 MED ORDER — CARVEDILOL 25 MG PO TABS: 25.0000 mg | ORAL_TABLET | Freq: Two times a day (BID) | ORAL | Status: DC

## 2013-07-19 MED ORDER — FAMOTIDINE IN NACL 20-0.9 MG/50ML-% IV SOLN: 20.0000 mg | INTRAVENOUS | Status: DC

## 2013-07-19 MED ORDER — HYDROXYZINE HCL 25 MG PO TABS: 25.0000 mg | ORAL_TABLET | Freq: Three times a day (TID) | ORAL | Status: DC | PRN

## 2013-07-19 MED ORDER — CAMPHOR-MENTHOL 0.5-0.5 % EX LOTN
TOPICAL_LOTION | CUTANEOUS | Status: DC | PRN
  Administered 2013-07-19: 05:00:00 via TOPICAL
  Filled 2013-07-19: qty 222

## 2013-07-19 MED ORDER — PHENYTOIN SODIUM EXTENDED 200 MG PO CAPS: 200.0000 mg | ORAL_CAPSULE | Freq: Three times a day (TID) | ORAL | Status: DC

## 2013-07-19 NOTE — Progress Notes (Signed)
07/18/13 20:37 As reported by outgoing RN,patient came back from HD with popped blister like area on his left upper back.Per patient,he gets this on and off,that they will just appear like boils.No drainage noted.Area covered with silicone(foam) dressing. Ismael Karge Joselita,RN

## 2013-07-19 NOTE — Progress Notes (Signed)
Subjective:  Improving with PT, feeling well, no complaints- very talkative, frustrated by bed alarm- wants to go home  Objective: Vital signs in last 24 hours: Temp:  [97.3 F (36.3 C)-98.8 F (37.1 C)] 98.8 F (37.1 C) (03/06 0440) Pulse Rate:  [81-100] 83 (03/06 0440) Resp:  [15-20] 15 (03/06 0440) BP: (122-170)/(79-110) 122/79 mmHg (03/06 0440) SpO2:  [95 %-98 %] 95 % (03/06 0440) Weight:  [66.906 kg (147 lb 8 oz)-67.2 kg (148 lb 2.4 oz)] 66.906 kg (147 lb 8 oz) (03/05 2111) Weight change:   Intake/Output from previous day: 03/05 0701 - 03/06 0700 In: 1250 [P.O.:1200; IV Piggyback:50] Out: 2901 [Stool:1]   Lab Results:  Recent Labs  07/18/13 1258  WBC 5.6  HGB 12.3*  HCT 36.2*  PLT 189   BMET:  Recent Labs  07/18/13 1258  NA 132*  K 4.9  CL 84*  CO2 24  GLUCOSE 133*  BUN 95*  CREATININE 12.42*  CALCIUM 10.2  ALBUMIN 3.5   No results found for this basename: PTH,  in the last 72 hours Iron Studies: No results found for this basename: IRON, TIBC, TRANSFERRIN, FERRITIN,  in the last 72 hours  EXAM: General appearance:  Alert, in no apparent distress Resp:  CTA without rales, rhonchi, or wheezes Cardio:  RRR without murmur or rub GI:  + BS, soft and nontender Extremities:  No edema Access:  AVF @ LFA with + bruit  Dialysis: TTS Jeremy Mccoy  4hr 86min 71kg 1K/2.5 Bath Heparin none LFA AVF  Hectorol 4  Labs: tsat 27% pth 369   Assessment/Plan: 1. Seizures/status epilepticus - sec to uremia (Cr 20) & malignant HTN, on Dilantin & Keppra per Neuro. 2. PRES - per MRI 2/26, sec to malignant HTN. 3. ESRD - HD on TTS @ [facility], K 4.9.  HD tomorrow. 4. HTN/Volume - BP 122/79 on Amlodipine, Losartan, & Carvedilol; wt 66.9 kg s/p net 2.9 L yesterday. 5. Anemia - Hgb 12.3, stable. 6. Sec HPT - Ca 10.2 (10.6 corrected), P 6.4; Hectorol decreased to 2 mcg for high Ca, Fosrenol 1 g with meals. 7. Nutrition - renal diet & vitamin. 8. Hx noncompliance    LOS: 9 days    Jeremy Mccoy 07/19/2013,9:24 AM  Patient seen and examined, agree with above note with above modifications. Pt continues to do well.  I think is stable for discharge today and we can arrange dialysis tomorrow at [facility]  Corliss Parish, MD 07/19/2013

## 2013-07-19 NOTE — Discharge Summary (Signed)
Physician Discharge Summary  Jeremy Mccoy [id number] DOB: 1965/07/21 DOA: 07/10/2013  PCP: No PCP Per Patient  Admit date: 07/10/2013 Discharge date: 07/19/2013  Time spent: >30 minutes  Recommendations for Outpatient Follow-up:  1. BMET to follow electrolytes and renal function 2. Make sure patient has arranged follow up with neurology as instructed 3. Reassess BP and adjust medications as needed  Discharge Diagnoses:  Principal Problem:   Status epilepticus Active Problems:   Acute respiratory failure   HTN (hypertension)   ESRD (end stage renal disease)   Protein-calorie malnutrition, severe Pulmonary edema GERD  Discharge Condition: stable and improved. Discharged home with parents for assistance and supervision.   Diet recommendation: low sodium diet  Filed Weights   07/18/13 1230 07/18/13 1647 07/18/13 2111  Weight: 67.2 kg (148 lb 2.4 oz) 67.1 kg (147 lb 14.9 oz) 66.906 kg (147 lb 8 oz)    History of present illness:    Hospital Course:  Status epilepticus  - due to PRES + uremia - cont Keppra and Dilantin per Neuro ("Although he most likely had provoked SE in the context of PRES, will suggest maintaining current AED regimen for the next few weeks and making a decision about weaning him off anticonvulsants in the outpatient setting.")  - no further seizure activity while inpatient  PRES - Malignant HTN  - BP controlled at time of discharge -advise to follow low sodium diet, take medications as prescribed and to be compliant with HD treatment -encouraged to quit smoking and using recreational drugs as well  ESRD - Non-compliance with T/Th/S dialysis  - cont hemodialysis - Nephrology following   Pulmonary edema  - due to HTN and missed HD - resolved  -at discharge no SOB  Acute respiratory failure due to pulmonary edema and seizure - due to inability to protect airway due to Sz - extubated 07/14/2013  -no further SOB or respiratory needs -advise to quit  smoking  Possible aspiration - Klebsiella from trach aspirate  - has completed a course of abx while inpatient - no evidence of infection at present   Protein-calorie malnutrition, severe  -continue Nepro   Dysphagia/GERD  -likely due to intubation -per patient improving -continue pepcid and PPI's   Procedures and significant events:  2/25 seizure > intubated   2/28 off pentobarb and on diprivan.   ET tube 2/25>>>3/1   L IJ TLC 2/25>>>3/2 (planned d/c after PIV established)   R radial a-line 2/25>>>out   Consultations:  Neurology  PCCM  Nephrology  Discharge Exam: Filed Vitals:   07/19/13 1418  BP: 147/97  Pulse: 89  Temp: 99.4 F (37.4 C)  Resp: 18     General: No acute respiratory distress at rest  Lungs: Clear to auscultation bilaterally without wheezes or crackles  Cardiovascular: Regular rate and rhythm without murmur gallop or rub normal S1 and S2-  Abdomen: Nontender, nondistended, soft, bowel sounds positive, no rebound, no ascites, no appreciable mass  Extremities: No significant cyanosis, clubbing, or edema bilateral lower extremities  Discharge Instructions  Discharge Orders   Future Orders Complete By Expires   Diet - low sodium heart healthy  As directed    Discharge instructions  As directed    Comments:     Stop smoking Take medications as prescribed Please follow with PCP in 10 days and be compliant with your HD sessions  Follow low sodium diet Stop recreational drugs       Medication List  amLODipine 10 MG tablet  Commonly known as:  NORVASC  Take 10 mg by mouth daily.     carvedilol 25 MG tablet  Commonly known as:  COREG  Take 1 tablet (25 mg total) by mouth 2 (two) times daily with a meal. On Monday-Wednesday-Friday-Sunday (non-HD days); on dialysis days (Tuesday-Thursday-Saturday) take it once a day (evening time only).     famotidine 20 MG tablet  Commonly known as:  PEPCID  Take 20 mg by mouth daily.      feeding supplement (NEPRO CARB STEADY) Liqd  Take 237 mLs by mouth 4 (four) times daily.     hydrOXYzine 25 MG tablet  Commonly known as:  ATARAX/VISTARIL  Take 1 tablet (25 mg total) by mouth every 8 (eight) hours as needed for anxiety.     levETIRAcetam 500 MG tablet  Commonly known as:  KEPPRA  Take 1 tablet (500 mg total) by mouth 2 (two) times daily.     losartan 100 MG tablet  Commonly known as:  COZAAR  Take 100 mg by mouth daily.     multivitamin Tabs tablet  Take 1 tablet by mouth daily.     pantoprazole 40 MG tablet  Commonly known as:  PROTONIX  Take 40 mg by mouth daily.     phenytoin 200 MG ER capsule  Commonly known as:  DILANTIN  Take 1 capsule (200 mg total) by mouth 3 (three) times daily.       No Known Allergies    The results of significant diagnostics from this hospitalization (including imaging, microbiology, ancillary and laboratory) are listed below for reference.    Significant Diagnostic Studies: Ct Head Wo Contrast  07/11/2013   CLINICAL DATA:  History of end-stage renal disease and cocaine abuse. Found down. Seizure. Abnormal pupils.  EXAM: CT HEAD WITHOUT CONTRAST  TECHNIQUE: Contiguous axial images were obtained from the base of the skull through the vertex without intravenous contrast.  COMPARISON:  CT HEAD W/O CM dated 07/10/2013  FINDINGS: Sinuses/Soft tissues: Possible right supraorbital scalp soft tissue swelling. Example image 41/series 3. Endotracheal tube. Mucous retention cyst or polyp in the right maxillary sinus. Trace mucosal thickening of bilateral maxillary sinuses and ethmoid air cells.  Intracranial: No mass lesion, hemorrhage, hydrocephalus, acute infarct, intra-axial, or extra-axial fluid collection.  IMPRESSION: 1.  No acute intracranial abnormality. 2. Sinus disease. 3. Possible right supraorbital scalp soft tissue swelling, mild.   Electronically Signed   By: Abigail Miyamoto M.D.   On: 07/11/2013 13:19   Ct Head Wo  Contrast  07/10/2013   CLINICAL DATA:  Severe headache.  Unresponsive.  EXAM: CT HEAD WITHOUT CONTRAST  TECHNIQUE: Contiguous axial images were obtained from the base of the skull through the vertex without intravenous contrast.  COMPARISON:  No comparison studies available.  FINDINGS: There is no evidence for acute hemorrhage, hydrocephalus, mass lesion, or abnormal extra-axial fluid collection. No definite CT evidence for acute infarction. Polypoid mucosal disease is seen in the right maxillary sinus. Remaining visualized paranasal sinuses are clear. No evidence for mastoid air cell fluid. A nasal tube is evident.  IMPRESSION: No acute intracranial abnormality.   Electronically Signed   By: Misty Stanley M.D.   On: 07/10/2013 07:31   Mr Brain Wo Contrast  07/11/2013   ADDENDUM REPORT: 07/11/2013 18:03  ADDENDUM: Findings discussed with and reconfirmed by Dr.OSVALDO CAMILO on 07/11/2013 5:50 PM.   Electronically Signed   By: Elon Alas   On: 07/11/2013 18:03   07/11/2013  CLINICAL DATA:  Status epilepticus, history of end-stage renal disease and cocaine abuse.  EXAM: MRI HEAD WITHOUT CONTRAST  TECHNIQUE: Multiplanar, multiecho pulse sequences of the brain and surrounding structures were obtained without intravenous contrast.  COMPARISON:  CT HEAD W/O CM dated 07/11/2013  FINDINGS: The ventricles and sulci are normal for patient's age. Abnormal mildly expansile FLAIR T2 hyperintense signal within the bilateral occipital lobes, to a lesser extent parietal lobes, and right frontal lobe, predominately cortical distribution. However there is a subcentimeter T2 hyperintensity within the splenium of the right corpus callosum as well as additional subcentimeter FLAIR T2 hyperintensities in the supratentorial and right pons white matter. Minimal associated reduced diffusion, and low ADC values. No midline shift. No susceptibility artifact to suggest hemorrhage.  No abnormal extra-axial fluid collections. No  extra-axial masses though, contrast enhanced sequences would be more sensitive. Normal major intracranial vascular flow voids seen at the skull base.  Ocular globes and orbital contents are unremarkable though not tailored for evaluation. No abnormal sellar expansion.  Life-support lines in place with mild paranasal sinus mucosal thickening, trace air-fluid level in the left maxillary sinus. Trace fluid signal seen in the right mastoid tip. Small right frontal scalp hematoma. No abnormal sellar expansion. No cerebellar tonsillar ectopia.  IMPRESSION: Abnormal patchy predominantly cortical signal within the parietoccipital lobes, right posterior frontal lobe. Additional patchy abnormal FLAIR T2 hyperintense signal seen, including the splenium of the corpus callosum. Constellation of findings can be seen with PRES (posterior reversible encephalopathic syndrome), though superimposed postictal MR findings may have this appearance, less likely.  White matter changes may reflect chronic small vessel ischemic disease, though may be part of the acute process.  Electronically Signed: By: Elon Alas On: 07/11/2013 17:52   Dg Chest Port 1 View  07/15/2013   CLINICAL DATA:  Extubation.  End-stage renal disease.  EXAM: PORTABLE CHEST - 1 VIEW  COMPARISON:  DG CHEST 1V PORT dated 07/14/2013; DG CHEST 1V PORT dated 07/13/2013  FINDINGS: Endotracheal tube removed.  Nasogastric tube removed.  Left IJ line tip:  SVC.  Mild cardiomegaly. Mild airway thickening noted. Upper zone vascular prominence could be positional or due to pulmonary venous hypertension.  IMPRESSION: 1. Airway thickening may reflect bronchitis or reactive airways disease. 2. Bowel cardiomegaly. Possible pulmonary venous hypertension. No overt edema.   Electronically Signed   By: Sherryl Barters M.D.   On: 07/15/2013 07:58   Dg Chest Port 1 View  07/14/2013   CLINICAL DATA:  Endotracheal tube.  EXAM: PORTABLE CHEST - 1 VIEW  COMPARISON:  DG CHEST 1V PORT  dated 07/13/2013; DG CHEST 1V PORT dated 07/10/2013; MR HEAD W/O CM dated 07/11/2013  FINDINGS: Endotracheal tube 4.6 cm above chronic. Nasogastric tube extends beyond the inferior aspect of the film. Left internal jugular line unchanged with tip at mid SVC.  Normal heart size. Right hilar soft tissue fullness again identified. Possible right mediastinal soft tissue fullness. This appearance could be partially projectional. No pleural effusion or pneumothorax. Diffuse peribronchial thickening. No lobar consolidation. Suspect multiple tiny calcified granulomas, especially in the right upper lobe.  IMPRESSION: Stable support apparatus.  Right hilar and right paratracheal soft tissue fullness again identified. This could be partially projectional. Adenopathy or mass/ masses cannot be excluded. Recommend attention on followup or consideration of PA and lateral radiographs. CT may eventually be necessary.   Electronically Signed   By: Abigail Miyamoto M.D.   On: 07/14/2013 08:11   Dg Chest Port 1 View  07/13/2013  CLINICAL DATA:  Evaluate endotracheal tube placement  EXAM: PORTABLE CHEST - 1 VIEW  COMPARISON:  DG CHEST 1V PORT dated 07/12/2013; DG CHEST 1V PORT dated 07/10/2013; DG CHEST PORT 1VSAME DAY dated 07/10/2013  FINDINGS: Grossly unchanged cardiac silhouette and mediastinal contours with unchanged nodularity of the right hilum and right paratracheal stripe. Stable position of support apparatus. Improved aeration of the lungs. No new focal airspace opacity. No pleural effusion pneumothorax. No evidence of edema. Unchanged bones.  IMPRESSION: 1.  Stable positioning of support apparatus.  No pneumothorax. 2. Unchanged nodularity of the right hilum and right paratracheal stripe, possibly accentuated due to projection and obliquity, although adenopathy may have a similar appearance. Further evaluation with dedicated PA and lateral radiographs after resolution of acute symptoms is recommended.   Electronically Signed   By:  Sandi Mariscal M.D.   On: 07/13/2013 11:18   Dg Chest Port 1 View  07/12/2013   CLINICAL DATA:  Intubation.  EXAM: PORTABLE CHEST - 1 VIEW  COMPARISON:  DG CHEST 1V PORT dated 07/11/2013  FINDINGS: Endotracheal tube, left IJ line, and NG tube in stable position. Persistent cardiomegaly with mild pulmonary vascular prominence. Developing pulmonary alveolar and interstitial infiltrates are present particularly in the right lung base on today's exam. Small right pleural effusion cannot be excluded. These findings suggest congestive heart failure with pulmonary edema. Superimposed pneumonia cannot be excluded . No pneumothorax. No acute osseous abnormality.  IMPRESSION: 1. Stable line and tube positions. 2. Developing congestive heart failure with pulmonary edema. Superimposed pneumonia particularly in the right lung base cannot be excluded.   Electronically Signed   By: [place]   On: 07/12/2013 07:39   Dg Chest Port 1 View  07/11/2013   CLINICAL DATA:  Intubation.  EXAM: PORTABLE CHEST - 1 VIEW  COMPARISON:  DG CHEST PORT 1VSAME DAY dated 07/10/2013  FINDINGS: Endotracheal tube, NG tube in stable position. Interim placement of left IJ line, its tip is projected over the superior vena cava. Stable cardiomegaly. Mild interstitial prominence particularly left lower lobe noted. Mild pneumonitis or interstitial pulmonary edema cannot be excluded. No pleural effusion or pneumothorax. No acute osseous abnormality.  IMPRESSION: 1. Endotracheal tube and NG tube in stable position. Interval placement of left IJ line, its tip is projected over superior vena cava. 2. Persistent cardiomegaly . 3. Interim increase in interstitial markings particularly in the left lower lobe. Interstitial pneumonitis and/or mild interstitial pulmonary edema could present in this fashion.   Electronically Signed   By: Marcello Moores  Register   On: 07/11/2013 08:02   Dg Chest Port 1 View  07/10/2013   CLINICAL DATA:  Unresponsive patient.   Endotracheal tube placement.  EXAM: PORTABLE CHEST - 1 VIEW  COMPARISON:  None.  FINDINGS: Endotracheal tube tip is just below the thoracic inlet, 7.1 cm above the carina. Nasogastric tube extends as far as the gastroesophageal junction and quite likely into the stomach.  Tiny punctate densities scattered in the lungs are observed, suggesting small calcified nodules.  Heart size within normal limits for technique. The lungs appear otherwise clear.  IMPRESSION: 1. Multiple tiny dense nodules in the lungs probably represent calcified tiny nodules, in the 3 mm range. Most likely causes are remote varicella infection or old granulomatous disease, although there are post of different possibilities for this type of appearance, including hyperparathyroidism, metastatic disease from osteosarcoma, alveolar microlithiasis, and pulmonary hemosiderosis, among others.   Electronically Signed   By: Sherryl Barters M.D.   On:  07/10/2013 07:17   Dg Chest Port 1v Same Day  07/10/2013   CLINICAL DATA:  Central line placement  EXAM: PORTABLE CHEST - 1 VIEW SAME DAY  COMPARISON:  07/10/2013  FINDINGS: Left jugular catheter tip in the SVC.  No pneumothorax.  Endotracheal tube in good position.  NG tube enters the stomach.  The lungs are clear without infiltrate or effusion. Vascularity normal.  IMPRESSION: Satisfactory central line placement.   Electronically Signed   By: Franchot Gallo M.D.   On: 07/10/2013 09:09   Dg Abd Portable 1v  07/10/2013   CLINICAL DATA:  Orogastric tube placement.  EXAM: PORTABLE ABDOMEN - 1 VIEW  COMPARISON:  None.  FINDINGS: Orogastric tube extends below the diaphragm to curl within the proximal stomach.  Normal bowel gas pattern. Abdominal soft tissues are unremarkable. Lung bases are clear.  IMPRESSION: Orogastric tube curls within the proximal stomach.   Electronically Signed   By: Lajean Manes M.D.   On: 07/10/2013 18:57    Microbiology: Recent Results (from the past 240 hour(s))  CULTURE,  BLOOD (ROUTINE X 2)     Status: None   Collection Time    07/10/13  6:52 AM      Result Value Ref Range Status   Specimen Description BLOOD RIGHT ANTECUBITAL   Final   Special Requests BOTTLES DRAWN AEROBIC AND ANAEROBIC 10CC   Final   Culture  Setup Time     Final   Value: 07/10/2013 12:40     Performed at [facility]   Culture     Final   Value: NO GROWTH 5 DAYS     Performed at [facility]   Report Status 07/16/2013 FINAL   Final  CULTURE, BLOOD (ROUTINE X 2)     Status: None   Collection Time    07/10/13  6:59 AM      Result Value Ref Range Status   Specimen Description BLOOD RIGHT FOREARM   Final   Special Requests BOTTLES DRAWN AEROBIC AND ANAEROBIC 10CC   Final   Culture  Setup Time     Final   Value: 07/10/2013 12:40     Performed at [facility]   Culture     Final   Value: NO GROWTH 5 DAYS     Performed at [facility]   Report Status 07/16/2013 FINAL   Final  MRSA PCR SCREENING     Status: None   Collection Time    07/10/13  9:39 AM      Result Value Ref Range Status   MRSA by PCR NEGATIVE  NEGATIVE Final   Comment:            The GeneXpert MRSA Assay (FDA     approved for NASAL specimens     only), is one component of a     comprehensive MRSA colonization     surveillance program. It is not     intended to diagnose MRSA     infection nor to guide or     monitor treatment for     MRSA infections.  CULTURE, RESPIRATORY (NON-EXPECTORATED)     Status: None   Collection Time    07/10/13  1:44 PM      Result Value Ref Range Status   Specimen Description ENDOTRACHEAL   Final   Special Requests TRACHEAL ASPIRATE   Final   Gram Stain     Final   Value: MODERATE WBC PRESENT, PREDOMINANTLY PMN  RARE SQUAMOUS EPITHELIAL CELLS PRESENT     FEW GRAM POSITIVE COCCI     IN CLUSTERS FEW GRAM NEGATIVE RODS     Performed at [facility]   Culture     Final   Value: ABUNDANT KLEBSIELLA OXYTOCA     Performed at [facility]   Report Status 07/13/2013 FINAL   Final   Organism ID, Bacteria KLEBSIELLA OXYTOCA   Final     Labs: Basic Metabolic Panel:  Recent Labs Lab 07/13/13 0415 07/14/13 0350 07/15/13 0525 07/16/13 0749 07/18/13 1258  NA 138 141 135* 138 132*  K 4.1 3.7 4.0 4.8 4.9  CL 92* 96 89* 89* 84*  CO2 26 27 21 20 24   GLUCOSE 107* 96 78 96 133*  BUN 45* 38* 59* 80* 95*  CREATININE 10.08* 8.00* 11.01* 12.86* 12.42*  CALCIUM 10.1 10.1 10.4 10.6* 10.2  MG 2.6* 2.5  --   --   --   PHOS 7.3* 4.9*  --  7.6* 6.4*   Liver Function Tests:  Recent Labs Lab 07/13/13 0415 07/16/13 0749 07/18/13 1258  ALBUMIN 3.3* 3.6 3.5   CBC:  Recent Labs Lab 07/13/13 0415 07/14/13 0350 07/15/13 0525 07/16/13 0749 07/18/13 1258  WBC 6.3 4.8 7.2 9.0 5.6  HGB 11.7* 11.7* 12.1* 12.8* 12.3*  HCT 35.6* 36.1* 36.3* 37.8* 36.2*  MCV 93.7 94.8 94.3 92.6 91.0  PLT 111* 118* 149* 186 189   BNP: BNP (last 3 results)  Recent Labs  07/10/13 0659  PROBNP 29037.0*   CBG:  Recent Labs Lab 07/14/13 2028 07/14/13 2347 07/15/13 0327 07/15/13 0742 07/15/13 1244  GLUCAP 78 86 81 86 115*   Signed:  Zerita Boers  [facility] 07/19/2013, 3:19 PM

## 2013-07-22 ENCOUNTER — Encounter (HOSPITAL_COMMUNITY): Payer: Self-pay | Admitting: Gastroenterology

## 2013-07-23 LAB — DRUG SCREEN PANEL (SERUM)

## 2013-07-26 ENCOUNTER — Encounter: Payer: Self-pay | Admitting: Neurology

## 2013-07-29 ENCOUNTER — Encounter (INDEPENDENT_AMBULATORY_CARE_PROVIDER_SITE_OTHER): Payer: Self-pay

## 2013-07-29 ENCOUNTER — Encounter: Payer: Self-pay | Admitting: Neurology

## 2013-07-29 ENCOUNTER — Ambulatory Visit (INDEPENDENT_AMBULATORY_CARE_PROVIDER_SITE_OTHER): Payer: Medicare Other | Admitting: Neurology

## 2013-07-29 VITALS — BP 129/84 | HR 79 | Ht 72.0 in | Wt 160.0 lb

## 2013-07-29 DIAGNOSIS — G609 Hereditary and idiopathic neuropathy, unspecified: Secondary | ICD-10-CM

## 2013-07-29 DIAGNOSIS — R569 Unspecified convulsions: Secondary | ICD-10-CM

## 2013-07-29 NOTE — Progress Notes (Signed)
[facility]    Provider:  Dr Janann Colonel Referring Provider: Leamon Arnt, MD Primary Care Physician:  Leamon Arnt, MD  CC:  seizures  HPI:  Jeremy Mccoy is a 48 y.o. male here as a referral from Dr. Jonni Sanger for seizure follow up  Patient was hospitalized on 2/25 for status epilepticus after being found down by his family. Had MRI findings consistent with diagnosis of PRES, he was intubated and started on IV keppra and dilantin, along with IV propfol and pentobarbital as needed. Was discharged with Keppra 500mg  BID and Dilantin 600mg  daily. States he feels his symptoms were due to poor compliance of his HD, not eating properly. States that the day of his seizure event he had taken 3 to 5 percocet at once for severe pain. States he has been cocaine free for over 1 month, has not used opiates since prior to hospital discharge.   States he has severe headaches and needs more Percocet. States he had some from the hospital and from his PCP but ran out of it. He states he "obtained some illegally" and that has helped his pain.   Neuor-hospitalist consult 07/10/2013: Jeremy Mccoy is an 48 y.o. male with a past medical history significant for cocaine abuse, ESRD, found unresponsive and seizing by family earlier today, EMS summoned and patient brought to the ED where he was intubated and continues seizing despite receiving lorazepam 2 mg, 4 mg midazolam, 1 gram loading dose dilantin, and propofol.  CT brain showed no acute abnormality.  No further clinical history available at this time.  MRI brain reviewed showing signs of questionable PRES  Review of Systems: Out of a complete 14 system review, the patient complains of only the following symptoms, and all other reviewed systems are negative. + blurred vision, loss of vision, confusion, headache, numbness, dizziness, seizure   History   Social History  . Marital Status: Single    Spouse Name: N/A    Number of  Children: 0  . Years of Education: 9 th   Occupational History  . floor insulation     Disabled   Social History Main Topics  . Smoking status: Current Every Day Smoker -- 1.00 packs/day for 35 years    Types: Cigars, Cigarettes  . Smokeless tobacco: Never Used  . Alcohol Use: No     Comment: 05/15/2013 "aien't drank in ~ 25 yrs"  . Drug Use: Yes    Special: Marijuana, Cocaine     Comment: Precription drugs (e.g. percocet) "I take what I have to to controll my constant pain" (05/15/2013)  . Sexual Activity: No   Other Topics Concern  . Not on file   Social History Narrative   ** Merged History Encounter **   Patient lives at home with his parents and he is disabled.   Education 9th grade education   Right handed   Caffeine one cup daily        Family History  Problem Relation Age of Onset  . Heart attack Father     Past Medical History  Diagnosis Date  . COPD (chronic obstructive pulmonary disease)   . Crack cocaine use   . Dental caries   . HTN (hypertension) 06/12/2012  . Hematemesis/vomiting blood   . Shortness of breath     "just when I have too much potassium is too high" (05/15/2013)  . Anemia   . History of blood transfusion 10/2011; 04/2013  . Headache(784.0)     "get it  from going to dialysis; when they get real bad I come to hospital" (05/15/2013)  . Arthritis     "qwhere" (05/15/2013)  . End stage renal disease 01/11/2012    Patient presented 11/04/11 to hospital with CP, wt loss and N/V. Creat was 10, admitted. 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 ANA was + 1:80, +MPO and +pr3 Ab's (ANCA). Received plasmapheresis x 7, IV cytoxan and pred taper. First HD was 11/17/11. 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 Pt d/c'd to do outpt HD and may have gotten 1-2 additional Cytoxan as OP, but this was stopped eventually since renal  function didn't recover. Now gets HD TTS schedule at [facility].  L forearm AVF 11/19/11 by Dr. Scot Dock is current access.    Marland Kitchen ESRD (end stage renal disease) on dialysis     TTS: [address] [place] (05/15/2013)  . ESRD (end stage renal disease) on dialysis   . Headache(784.0)   . Depression   . Anxiety     Past Surgical History  Procedure Laterality Date  . Av fistula placement  11/29/2011    Procedure: ARTERIOVENOUS (AV) FISTULA CREATION;  Surgeon: Angelia Mould, MD;  Location: [place];  Service: Vascular;  Laterality: Left;  . Renal biopsy  11/07/2011  . Inguinal hernia repair Bilateral 1967  . Esophagogastroduodenoscopy N/A 05/17/2013    Procedure: ESOPHAGOGASTRODUODENOSCOPY (EGD);  Surgeon: Beryle Beams, MD;  Location: [facility] ENDOSCOPY;  Service: Endoscopy;  Laterality: N/A;    Current Outpatient Prescriptions  Medication Sig Dispense Refill  . acetaminophen (TYLENOL) 500 MG tablet Take 1,000 mg by mouth every 6 (six) hours as needed. pain      . amLODipine (NORVASC) 10 MG tablet Take 10 mg by mouth daily.      Marland Kitchen amLODipine (NORVASC) 10 MG tablet Take 10 mg by mouth daily.      Marland Kitchen b complex vitamins tablet Take 1 tablet by mouth daily.      . calcium carbonate, dosed in mg elemental calcium, 1250 MG/5ML Take 5 mLs (500 mg of elemental calcium total) by mouth every 6 (six) hours as needed for indigestion.  450 mL    . carvedilol (COREG) 25 MG tablet Take 1 tablet (25 mg total) by mouth 2 (two) times daily with a meal. On Monday-Wednesday-Friday-Sunday (non-HD days); on dialysis days (Tuesday-Thursday-Saturday) take it once a day (evening time only).  60 tablet  1  . famotidine (PEPCID) 20 MG tablet Take 20 mg by mouth daily.      . hydrOXYzine (ATARAX/VISTARIL) 25 MG tablet Take 25 mg by mouth 3 (three) times daily as needed. itching      . hydrOXYzine (ATARAX/VISTARIL) 25 MG tablet Take 1 tablet (25 mg total) by mouth every 8 (eight) hours as needed for anxiety.  30 tablet  0  .  levETIRAcetam (KEPPRA) 500 MG tablet Take 1 tablet (500 mg total) by mouth 2 (two) times daily.  60 tablet  1  . losartan (COZAAR) 100 MG tablet Take 100 mg by mouth at bedtime.      Marland Kitchen losartan (COZAAR) 100 MG tablet Take 100 mg by mouth daily.      . multivitamin (RENA-VIT) TABS tablet Take 1 tablet by mouth at bedtime.      . multivitamin (RENA-VIT) TABS tablet Take 1 tablet by mouth daily.      . Nutritional Supplements (FEEDING SUPPLEMENT,  NEPRO CARB STEADY,) LIQD Take 237 mLs by mouth 3 (three) times daily as needed (Supplement).    0  . Nutritional Supplements (FEEDING SUPPLEMENT, NEPRO CARB STEADY,) LIQD Take 237 mLs by mouth 4 (four) times daily.      . pantoprazole (PROTONIX) 40 MG tablet Take 1 tablet (40 mg total) by mouth every morning.  30 tablet  0  . pantoprazole (PROTONIX) 40 MG tablet Take 40 mg by mouth daily.      . phenytoin (DILANTIN) 200 MG ER capsule Take 1 capsule (200 mg total) by mouth 3 (three) times daily.  90 capsule  1  . predniSONE (DELTASONE) 10 MG tablet Take 10 mg by mouth daily with breakfast.      . sevelamer carbonate (RENVELA) 800 MG tablet Take 800 mg by mouth 3 (three) times daily with meals.       No current facility-administered medications for this visit.    Allergies as of 07/29/2013  . (No Known Allergies)    Vitals: BP 129/84  Pulse 79  Ht 6' (1.829 m)  Wt 160 lb (72.576 kg)  BMI 21.70 kg/m2 Last Weight:  Wt Readings from Last 1 Encounters:  07/29/13 160 lb (72.576 kg)   Last Height:   Ht Readings from Last 1 Encounters:  07/29/13 6' (1.829 m)     Physical exam: Exam: Gen: NAD, conversant Eyes: anicteric sclerae, moist conjunctivae HENT: Atraumatic, oropharynx clear Neck: Trachea midline; supple,  Lungs: CTA, no wheezing, rales, rhonic                          CV: RRR, no MRG Abdomen: Soft, non-tender;  Extremities: No peripheral edema  Skin: Normal temperature, no rash,  Psych: Appropriate affect, pleasant  Neuro: MS:  AA&Ox3, appropriately interactive, normal affect   Speech: fluent w/o paraphasic error  Memory: good recent and remote recall  CN: PERRL, EOMI no nystagmus, no ptosis, sensation intact to LT V1-V3 bilat, face symmetric, no weakness, hearing grossly intact, palate elevates symmetrically, shoulder shrug 5/5 bilat,  tongue protrudes midline, no fasiculations noted.  Motor: normal bulk and tone Strength: 5/5  In all extremities  Coord: rapid alternating and point-to-point (FNF, HTS) movements intact.  Reflexes: symmetrical, bilat downgoing toes  Sens: LT intact in all extremities with some subjective decreased LT in RLE  Gait: posture, stance, stride and arm-swing normal. Tandem gait intact. Able to walk on heels and toes. Romberg absent.   Assessment:  After physical and neurologic examination, review of laboratory studies, imaging, neurophysiology testing and pre-existing records, assessment will be reviewed on the problem list.  Plan:  Treatment plan and additional workup will be reviewed under Problem List.  1)Seizures: likely provoked 2)Headache: suspect 2/2 resolving PRES 3)Peripheral neuropathy  47y/o gentleman with history of substance abuse presenting for initial evaluation after recent hospital stay for status epilepticus. Suspect seizure etiology likely multifactorial and related to poly-substance abuse, ESRD, poorly controlled BP and possible PRES. Currently stable and tolerating AEDs well with no noted adverse effects. Counseled patient on importance of avoiding narcotics and cocaine, offered option of setting him up with substance abuse treatment program, he denied. Counseled him to avoid driving for 6 months after recent seizure event. Will follow up after lab work and plan to taper down off AEDs as seizure episode likely provoked.   Jim Like, DO  [facility] [address] [place], [place] [address]  Phone [phone] Fax  [phone]

## 2013-07-29 NOTE — Patient Instructions (Addendum)
Overall you are doing fairly well but I do want to suggest a few things today:   Remember to drink plenty of fluid, eat healthy meals and do not skip any meals. Try to eat protein with a every meal and eat a healthy snack such as fruit or nuts in between meals. Try to keep a regular sleep-wake schedule and try to exercise daily, particularly in the form of walking, 20-30 minutes a day, if you can.   As far as your medications are concerned, I would like to suggest checking some blood work today and then we will consider tapering you off some of your seizure medications  As far as diagnostic testing:  1)Please have some lab work checked today after checking out at the front desk  Follow up as needed, we will call you after the lab tests to discuss findings and determine the best course for tapering you off of your seizure medication. Please call us with any interim questions, concerns, problems, updates or refill requests.   Please consider joining a substance abuse support group. If you need help finding one we will be happy to assist.  Per guidelines, no driving for minimum of 6 months after a seizure episode.   My clinical assistant and will answer any of your questions and relay your messages to me and also relay most of my messages to you.   Our phone number is [phone]. We also have an after hours call service for urgent matters and there is a physician on-call for urgent questions. For any emergencies you know to call 911 or go to the nearest emergency room

## 2013-07-31 LAB — HEPATIC FUNCTION PANEL
ALBUMIN: 4.1 g/dL (ref 3.5–5.5)
ALK PHOS: 88 IU/L (ref 39–117)
ALT: 13 IU/L (ref 0–44)
AST: 12 IU/L (ref 0–40)
Bilirubin, Direct: 0.07 mg/dL (ref 0.00–0.40)
Total Protein: 6.5 g/dL (ref 6.0–8.5)

## 2013-07-31 LAB — PHENYTOIN LEVEL, FREE AND TOTAL: Phenytoin, Free: 0.9 ug/mL — ABNORMAL LOW (ref 1.0–2.0)

## 2013-07-31 LAB — VITAMIN B12: Vitamin B-12: 701 pg/mL (ref 211–946)

## 2013-07-31 LAB — TSH: TSH: 2.48 u[IU]/mL (ref 0.450–4.500)

## 2013-07-31 NOTE — Progress Notes (Signed)
Quick Note:  Shared unremarkable labs per Dr Hazle Quant findings, patient verbalized understanding ______

## 2013-08-14 DEATH — deceased

## 2013-09-19 ENCOUNTER — Encounter: Payer: Self-pay | Admitting: Neurology

## 2013-09-19 ENCOUNTER — Other Ambulatory Visit: Payer: Self-pay | Admitting: Neurology

## 2013-09-19 MED ORDER — LEVETIRACETAM 500 MG PO TABS: 500.0000 mg | ORAL_TABLET | Freq: Two times a day (BID) | ORAL | Status: DC

## 2013-09-19 NOTE — Telephone Encounter (Signed)
Pt's mom Fraser Din called to request a refill on pt's levETIRAcetam (KEPPRA) 500 MG tablet, pt is out of this medication and pt is at dialisis and would like to be able to pick up while they were out. Please call Fraser Din when this has been called in. Thanks

## 2013-09-20 ENCOUNTER — Encounter: Payer: Self-pay | Admitting: Neurology

## 2013-09-26 ENCOUNTER — Encounter: Payer: Self-pay | Admitting: *Deleted

## 2013-10-30 ENCOUNTER — Ambulatory Visit (INDEPENDENT_AMBULATORY_CARE_PROVIDER_SITE_OTHER): Payer: Self-pay | Admitting: Neurology

## 2013-10-30 ENCOUNTER — Encounter: Payer: Self-pay | Admitting: Neurology

## 2013-10-30 VITALS — BP 176/108 | HR 85 | Ht 72.0 in | Wt 164.0 lb

## 2013-10-30 DIAGNOSIS — R569 Unspecified convulsions: Secondary | ICD-10-CM

## 2013-10-30 NOTE — Progress Notes (Signed)
Went to evaluate patient at 11:50 for 11:30am appointment and exam room was empty. Per front end staff, patient stated he was unable to wait any longer and needed to leave. Will follow up.

## 2013-11-04 ENCOUNTER — Emergency Department (HOSPITAL_COMMUNITY): Payer: Medicare Other

## 2013-11-04 ENCOUNTER — Inpatient Hospital Stay (HOSPITAL_COMMUNITY)
Admission: EM | Admit: 2013-11-04 | Discharge: 2013-11-08 | DRG: 291 | Disposition: A | Discharge status: Home / Self Care | Payer: Medicare Other | Attending: Internal Medicine | Admitting: Internal Medicine

## 2013-11-04 ENCOUNTER — Encounter (HOSPITAL_COMMUNITY): Payer: Self-pay | Admitting: Emergency Medicine

## 2013-11-04 DIAGNOSIS — E8770 Fluid overload, unspecified: Secondary | ICD-10-CM

## 2013-11-04 DIAGNOSIS — J449 Chronic obstructive pulmonary disease, unspecified: Secondary | ICD-10-CM | POA: Diagnosis present

## 2013-11-04 DIAGNOSIS — D631 Anemia in chronic kidney disease: Secondary | ICD-10-CM | POA: Diagnosis present

## 2013-11-04 DIAGNOSIS — N039 Chronic nephritic syndrome with unspecified morphologic changes: Secondary | ICD-10-CM

## 2013-11-04 DIAGNOSIS — J810 Acute pulmonary edema: Secondary | ICD-10-CM

## 2013-11-04 DIAGNOSIS — Z9119 Patient's noncompliance with other medical treatment and regimen: Secondary | ICD-10-CM

## 2013-11-04 DIAGNOSIS — I120 Hypertensive chronic kidney disease with stage 5 chronic kidney disease or end stage renal disease: Secondary | ICD-10-CM | POA: Diagnosis present

## 2013-11-04 DIAGNOSIS — D62 Acute posthemorrhagic anemia: Secondary | ICD-10-CM

## 2013-11-04 DIAGNOSIS — F411 Generalized anxiety disorder: Secondary | ICD-10-CM | POA: Diagnosis present

## 2013-11-04 DIAGNOSIS — G43909 Migraine, unspecified, not intractable, without status migrainosus: Secondary | ICD-10-CM | POA: Diagnosis present

## 2013-11-04 DIAGNOSIS — N186 End stage renal disease: Secondary | ICD-10-CM | POA: Diagnosis present

## 2013-11-04 DIAGNOSIS — F3289 Other specified depressive episodes: Secondary | ICD-10-CM | POA: Diagnosis present

## 2013-11-04 DIAGNOSIS — Z9115 Patient's noncompliance with renal dialysis: Secondary | ICD-10-CM

## 2013-11-04 DIAGNOSIS — M899 Disorder of bone, unspecified: Secondary | ICD-10-CM | POA: Diagnosis present

## 2013-11-04 DIAGNOSIS — I5022 Chronic systolic (congestive) heart failure: Secondary | ICD-10-CM

## 2013-11-04 DIAGNOSIS — J9690 Respiratory failure, unspecified, unspecified whether with hypoxia or hypercapnia: Secondary | ICD-10-CM | POA: Diagnosis present

## 2013-11-04 DIAGNOSIS — Z992 Dependence on renal dialysis: Secondary | ICD-10-CM

## 2013-11-04 DIAGNOSIS — Z91158 Patient's noncompliance with renal dialysis for other reason: Secondary | ICD-10-CM

## 2013-11-04 DIAGNOSIS — I159 Secondary hypertension, unspecified: Secondary | ICD-10-CM

## 2013-11-04 DIAGNOSIS — J4489 Other specified chronic obstructive pulmonary disease: Secondary | ICD-10-CM | POA: Diagnosis present

## 2013-11-04 DIAGNOSIS — IMO0002 Reserved for concepts with insufficient information to code with codable children: Secondary | ICD-10-CM

## 2013-11-04 DIAGNOSIS — I509 Heart failure, unspecified: Secondary | ICD-10-CM | POA: Diagnosis present

## 2013-11-04 DIAGNOSIS — G40909 Epilepsy, unspecified, not intractable, without status epilepticus: Secondary | ICD-10-CM | POA: Diagnosis present

## 2013-11-04 DIAGNOSIS — I10 Essential (primary) hypertension: Secondary | ICD-10-CM | POA: Diagnosis present

## 2013-11-04 DIAGNOSIS — J9600 Acute respiratory failure, unspecified whether with hypoxia or hypercapnia: Secondary | ICD-10-CM | POA: Diagnosis present

## 2013-11-04 DIAGNOSIS — F1410 Cocaine abuse, uncomplicated: Secondary | ICD-10-CM | POA: Diagnosis present

## 2013-11-04 DIAGNOSIS — Z720 Tobacco use: Secondary | ICD-10-CM

## 2013-11-04 DIAGNOSIS — F329 Major depressive disorder, single episode, unspecified: Secondary | ICD-10-CM | POA: Diagnosis present

## 2013-11-04 DIAGNOSIS — M949 Disorder of cartilage, unspecified: Secondary | ICD-10-CM

## 2013-11-04 DIAGNOSIS — R0602 Shortness of breath: Secondary | ICD-10-CM

## 2013-11-04 DIAGNOSIS — F1210 Cannabis abuse, uncomplicated: Secondary | ICD-10-CM | POA: Diagnosis present

## 2013-11-04 DIAGNOSIS — K029 Dental caries, unspecified: Secondary | ICD-10-CM

## 2013-11-04 DIAGNOSIS — J9601 Acute respiratory failure with hypoxia: Secondary | ICD-10-CM

## 2013-11-04 DIAGNOSIS — F1910 Other psychoactive substance abuse, uncomplicated: Secondary | ICD-10-CM

## 2013-11-04 DIAGNOSIS — H5350 Unspecified color vision deficiencies: Secondary | ICD-10-CM

## 2013-11-04 DIAGNOSIS — E875 Hyperkalemia: Secondary | ICD-10-CM | POA: Diagnosis present

## 2013-11-04 DIAGNOSIS — F17200 Nicotine dependence, unspecified, uncomplicated: Secondary | ICD-10-CM | POA: Diagnosis present

## 2013-11-04 DIAGNOSIS — R778 Other specified abnormalities of plasma proteins: Secondary | ICD-10-CM

## 2013-11-04 DIAGNOSIS — R7989 Other specified abnormal findings of blood chemistry: Secondary | ICD-10-CM

## 2013-11-04 DIAGNOSIS — G40901 Epilepsy, unspecified, not intractable, with status epilepticus: Secondary | ICD-10-CM

## 2013-11-04 DIAGNOSIS — N189 Chronic kidney disease, unspecified: Secondary | ICD-10-CM | POA: Diagnosis present

## 2013-11-04 DIAGNOSIS — I776 Arteritis, unspecified: Secondary | ICD-10-CM | POA: Diagnosis present

## 2013-11-04 DIAGNOSIS — E43 Unspecified severe protein-calorie malnutrition: Secondary | ICD-10-CM | POA: Diagnosis present

## 2013-11-04 DIAGNOSIS — I5042 Chronic combined systolic (congestive) and diastolic (congestive) heart failure: Principal | ICD-10-CM | POA: Diagnosis present

## 2013-11-04 DIAGNOSIS — N2581 Secondary hyperparathyroidism of renal origin: Secondary | ICD-10-CM | POA: Diagnosis present

## 2013-11-04 DIAGNOSIS — R9431 Abnormal electrocardiogram [ECG] [EKG]: Secondary | ICD-10-CM

## 2013-11-04 DIAGNOSIS — Z91199 Patient's noncompliance with other medical treatment and regimen due to unspecified reason: Secondary | ICD-10-CM

## 2013-11-04 DIAGNOSIS — Z8249 Family history of ischemic heart disease and other diseases of the circulatory system: Secondary | ICD-10-CM

## 2013-11-04 LAB — CBC WITH DIFFERENTIAL/PLATELET
BASOS PCT: 0 % (ref 0–1)
Basophils Absolute: 0 10*3/uL (ref 0.0–0.1)
Eosinophils Absolute: 0.1 10*3/uL (ref 0.0–0.7)
Eosinophils Relative: 1 % (ref 0–5)
HEMATOCRIT: 23.9 % — AB (ref 39.0–52.0)
Hemoglobin: 8 g/dL — ABNORMAL LOW (ref 13.0–17.0)
LYMPHS PCT: 12 % (ref 12–46)
Lymphs Abs: 0.8 10*3/uL (ref 0.7–4.0)
MCH: 32.8 pg (ref 26.0–34.0)
MCHC: 33.5 g/dL (ref 30.0–36.0)
MCV: 98 fL (ref 78.0–100.0)
MONO ABS: 0.5 10*3/uL (ref 0.1–1.0)
Monocytes Relative: 7 % (ref 3–12)
Neutro Abs: 5.2 10*3/uL (ref 1.7–7.7)
Neutrophils Relative %: 80 % — ABNORMAL HIGH (ref 43–77)
PLATELETS: 152 10*3/uL (ref 150–400)
RBC: 2.44 MIL/uL — ABNORMAL LOW (ref 4.22–5.81)
RDW: 13.8 % (ref 11.5–15.5)
WBC: 6.6 10*3/uL (ref 4.0–10.5)

## 2013-11-04 LAB — BASIC METABOLIC PANEL
BUN: 111 mg/dL — AB (ref 6–23)
BUN: 110 mg/dL — AB (ref 6–23)
CALCIUM: 10.3 mg/dL (ref 8.4–10.5)
CHLORIDE: 93 meq/L — AB (ref 96–112)
CHLORIDE: 92 meq/L — AB (ref 96–112)
CO2: 21 meq/L (ref 19–32)
CO2: 19 mEq/L (ref 19–32)
CREATININE: 20.11 mg/dL — AB (ref 0.50–1.35)
Calcium: 10.2 mg/dL (ref 8.4–10.5)
Creatinine, Ser: 19.96 mg/dL — ABNORMAL HIGH (ref 0.50–1.35)
GFR calc Af Amer: 3 mL/min — ABNORMAL LOW (ref >90)
GFR calc non Af Amer: 2 mL/min — ABNORMAL LOW (ref >90)
GFR calc non Af Amer: 2 mL/min — ABNORMAL LOW (ref >90)
GFR, EST AFRICAN AMERICAN: 3 mL/min — AB (ref >90)
GLUCOSE: 86 mg/dL (ref 70–99)
Glucose, Bld: 87 mg/dL (ref 70–99)
Potassium: >7.7 mEq/L (ref 3.7–5.3)
Potassium: >7.7 mEq/L (ref 3.7–5.3)
Sodium: 136 mEq/L — ABNORMAL LOW (ref 137–147)
Sodium: 135 mEq/L — ABNORMAL LOW (ref 137–147)

## 2013-11-04 LAB — GLUCOSE, RANDOM: Glucose, Bld: 79 mg/dL (ref 70–99)

## 2013-11-04 LAB — POTASSIUM: POTASSIUM: 4.5 meq/L (ref 3.7–5.3)

## 2013-11-04 LAB — I-STAT TROPONIN, ED: TROPONIN I, POC: 0.03 ng/mL (ref 0.00–0.08)

## 2013-11-04 LAB — CBG MONITORING, ED: Glucose-Capillary: 85 mg/dL (ref 70–99)

## 2013-11-04 MED ORDER — PANTOPRAZOLE SODIUM 40 MG PO TBEC
40.0000 mg | DELAYED_RELEASE_TABLET | Freq: Every day | ORAL | Status: DC
  Administered 2013-11-04 – 2013-11-08 (×5): 40 mg via ORAL
  Filled 2013-11-04 (×5): qty 1

## 2013-11-04 MED ORDER — ACETAMINOPHEN 650 MG RE SUPP: 650.0000 mg | Freq: Four times a day (QID) | RECTAL | Status: DC | PRN

## 2013-11-04 MED ORDER — NEPRO/CARBSTEADY PO LIQD
237.0000 mL | Freq: Three times a day (TID) | ORAL | Status: DC | PRN
  Filled 2013-11-04: qty 237

## 2013-11-04 MED ORDER — MORPHINE SULFATE 4 MG/ML IJ SOLN
4.0000 mg | Freq: Once | INTRAMUSCULAR | Status: AC
  Administered 2013-11-04: 4 mg via INTRAVENOUS
  Filled 2013-11-04: qty 1

## 2013-11-04 MED ORDER — SODIUM BICARBONATE 8.4 % IV SOLN
50.0000 meq | Freq: Once | INTRAVENOUS | Status: AC
  Administered 2013-11-04: 50 meq via INTRAVENOUS
  Filled 2013-11-04: qty 50

## 2013-11-04 MED ORDER — DEXTROSE 50 % IV SOLN
1.0000 | Freq: Once | INTRAVENOUS | Status: AC
  Administered 2013-11-04: 50 mL via INTRAVENOUS
  Filled 2013-11-04: qty 50

## 2013-11-04 MED ORDER — ALTEPLASE 2 MG IJ SOLR: 2.0000 mg | Freq: Once | INTRAMUSCULAR | Status: AC | PRN

## 2013-11-04 MED ORDER — NITROGLYCERIN IN D5W 200-5 MCG/ML-% IV SOLN
2.0000 ug/min | INTRAVENOUS | Status: DC
  Administered 2013-11-04: 5 ug/min via INTRAVENOUS
  Filled 2013-11-04: qty 250

## 2013-11-04 MED ORDER — HEPARIN SODIUM (PORCINE) 5000 UNIT/ML IJ SOLN
5000.0000 [IU] | Freq: Three times a day (TID) | INTRAMUSCULAR | Status: DC
  Administered 2013-11-04 – 2013-11-07 (×10): 5000 [IU] via SUBCUTANEOUS
  Filled 2013-11-04 (×15): qty 1

## 2013-11-04 MED ORDER — LIDOCAINE-PRILOCAINE 2.5-2.5 % EX CREA: 1.0000 "application " | TOPICAL_CREAM | CUTANEOUS | Status: DC | PRN

## 2013-11-04 MED ORDER — PREDNISONE 10 MG PO TABS
10.0000 mg | ORAL_TABLET | Freq: Every day | ORAL | Status: DC
  Administered 2013-11-05 – 2013-11-08 (×4): 10 mg via ORAL
  Filled 2013-11-04 (×5): qty 1

## 2013-11-04 MED ORDER — OXYCODONE HCL 5 MG PO TABS
5.0000 mg | ORAL_TABLET | ORAL | Status: DC | PRN
  Administered 2013-11-05 – 2013-11-06 (×4): 5 mg via ORAL
  Filled 2013-11-04 (×4): qty 1

## 2013-11-04 MED ORDER — SODIUM CHLORIDE 0.9 % IV SOLN: 100.0000 mL | INTRAVENOUS | Status: DC | PRN

## 2013-11-04 MED ORDER — ONDANSETRON HCL 4 MG PO TABS: 4.0000 mg | ORAL_TABLET | Freq: Four times a day (QID) | ORAL | Status: DC | PRN

## 2013-11-04 MED ORDER — ALBUTEROL SULFATE (2.5 MG/3ML) 0.083% IN NEBU
10.0000 mg | INHALATION_SOLUTION | Freq: Once | RESPIRATORY_TRACT | Status: AC
  Administered 2013-11-04: 10 mg via RESPIRATORY_TRACT
  Filled 2013-11-04: qty 12

## 2013-11-04 MED ORDER — LEVETIRACETAM 500 MG PO TABS
500.0000 mg | ORAL_TABLET | Freq: Two times a day (BID) | ORAL | Status: DC
  Administered 2013-11-04 – 2013-11-08 (×8): 500 mg via ORAL
  Filled 2013-11-04 (×10): qty 1

## 2013-11-04 MED ORDER — B COMPLEX-C PO TABS
1.0000 | ORAL_TABLET | Freq: Every day | ORAL | Status: DC
  Administered 2013-11-05 – 2013-11-08 (×4): 1 via ORAL
  Filled 2013-11-04 (×4): qty 1

## 2013-11-04 MED ORDER — GUAIFENESIN-DM 100-10 MG/5ML PO SYRP
5.0000 mL | ORAL_SOLUTION | ORAL | Status: DC | PRN
  Filled 2013-11-04: qty 5

## 2013-11-04 MED ORDER — HYDROXYZINE HCL 25 MG PO TABS
25.0000 mg | ORAL_TABLET | Freq: Three times a day (TID) | ORAL | Status: DC | PRN
  Administered 2013-11-05: 25 mg via ORAL
  Filled 2013-11-04 (×3): qty 1

## 2013-11-04 MED ORDER — SODIUM CHLORIDE 0.9 % IJ SOLN: 3.0000 mL | INTRAMUSCULAR | Status: DC | PRN

## 2013-11-04 MED ORDER — ACETAMINOPHEN 325 MG PO TABS
650.0000 mg | ORAL_TABLET | Freq: Four times a day (QID) | ORAL | Status: DC | PRN
  Administered 2013-11-04: 650 mg via ORAL

## 2013-11-04 MED ORDER — PENTAFLUOROPROP-TETRAFLUOROETH EX AERO: 1.0000 "application " | INHALATION_SPRAY | CUTANEOUS | Status: DC | PRN

## 2013-11-04 MED ORDER — ACETAMINOPHEN 325 MG PO TABS: 650.0000 mg | ORAL_TABLET | Freq: Four times a day (QID) | ORAL | Status: DC | PRN

## 2013-11-04 MED ORDER — NEPRO/CARBSTEADY PO LIQD: 237.0000 mL | ORAL | Status: DC | PRN

## 2013-11-04 MED ORDER — SODIUM CHLORIDE 0.9 % IJ SOLN: 3.0000 mL | Freq: Two times a day (BID) | INTRAMUSCULAR | Status: DC

## 2013-11-04 MED ORDER — SEVELAMER CARBONATE 800 MG PO TABS
800.0000 mg | ORAL_TABLET | Freq: Three times a day (TID) | ORAL | Status: DC
  Administered 2013-11-05 – 2013-11-08 (×9): 800 mg via ORAL
  Filled 2013-11-04 (×14): qty 1

## 2013-11-04 MED ORDER — SODIUM CHLORIDE 0.9 % IV SOLN
1.0000 g | Freq: Once | INTRAVENOUS | Status: AC
  Administered 2013-11-04: 1 g via INTRAVENOUS
  Filled 2013-11-04: qty 10

## 2013-11-04 MED ORDER — HYDRALAZINE HCL 20 MG/ML IJ SOLN
10.0000 mg | Freq: Once | INTRAMUSCULAR | Status: AC
  Administered 2013-11-04: 10 mg via INTRAVENOUS
  Filled 2013-11-04: qty 1

## 2013-11-04 MED ORDER — SODIUM CHLORIDE 0.9 % IV SOLN: 250.0000 mL | INTRAVENOUS | Status: DC | PRN

## 2013-11-04 MED ORDER — LIDOCAINE HCL (PF) 1 % IJ SOLN: 5.0000 mL | INTRAMUSCULAR | Status: DC | PRN

## 2013-11-04 MED ORDER — FAMOTIDINE 20 MG PO TABS
20.0000 mg | ORAL_TABLET | Freq: Every day | ORAL | Status: DC
  Administered 2013-11-04 – 2013-11-08 (×5): 20 mg via ORAL
  Filled 2013-11-04 (×5): qty 1

## 2013-11-04 MED ORDER — ALBUTEROL SULFATE (2.5 MG/3ML) 0.083% IN NEBU: 2.5000 mg | INHALATION_SOLUTION | RESPIRATORY_TRACT | Status: DC | PRN

## 2013-11-04 MED ORDER — INSULIN ASPART 100 UNIT/ML IV SOLN
5.0000 [IU] | Freq: Once | INTRAVENOUS | Status: AC
  Administered 2013-11-04: 5 [IU] via INTRAVENOUS
  Filled 2013-11-04: qty 0.05

## 2013-11-04 MED ORDER — ACETAMINOPHEN 325 MG PO TABS
ORAL_TABLET | ORAL | Status: AC
  Filled 2013-11-04: qty 2

## 2013-11-04 MED ORDER — NITROGLYCERIN 0.4 MG SL SUBL
0.4000 mg | SUBLINGUAL_TABLET | SUBLINGUAL | Status: DC | PRN
  Administered 2013-11-04: 0.4 mg via SUBLINGUAL

## 2013-11-04 MED ORDER — ONDANSETRON HCL 4 MG/2ML IJ SOLN: 4.0000 mg | Freq: Four times a day (QID) | INTRAMUSCULAR | Status: DC | PRN

## 2013-11-04 MED ORDER — SODIUM CHLORIDE 0.9 % IJ SOLN
3.0000 mL | Freq: Two times a day (BID) | INTRAMUSCULAR | Status: DC
  Administered 2013-11-04 – 2013-11-06 (×2): 3 mL via INTRAVENOUS

## 2013-11-04 MED ORDER — CALCIUM CARBONATE 1250 MG/5ML PO SUSP
500.0000 mg | Freq: Four times a day (QID) | ORAL | Status: DC | PRN
  Filled 2013-11-04: qty 5

## 2013-11-04 MED ORDER — MORPHINE SULFATE 2 MG/ML IJ SOLN
2.0000 mg | INTRAMUSCULAR | Status: DC | PRN
  Administered 2013-11-05 (×4): 2 mg via INTRAVENOUS
  Filled 2013-11-04 (×3): qty 1

## 2013-11-04 MED ORDER — HEPARIN SODIUM (PORCINE) 1000 UNIT/ML DIALYSIS: 1000.0000 [IU] | INTRAMUSCULAR | Status: DC | PRN

## 2013-11-04 NOTE — ED Provider Notes (Signed)
CSN: [id number]     Arrival date & time 11/04/13  1409 History   First MD Initiated Contact with Patient 11/04/13 1418     Chief Complaint  Patient presents with  . Shortness of Breath     (Consider location/radiation/quality/duration/timing/severity/associated sxs/prior Treatment) Patient is a 48 y.o. male presenting with shortness of breath.  Shortness of Breath Severity:  Severe Onset quality:  Gradual Timing:  Constant Progression:  Worsening Chronicity:  New Context comment:  Missed dialysis Relieved by:  Nothing (improved with CPAP) Worsened by:  Nothing tried Ineffective treatments:  None tried Associated symptoms: chest pain   Associated symptoms: no abdominal pain, no cough, no fever and no vomiting     48 yo male pw SOB. H/o ESRD. Dialysis TTHS. Missed last session and with only partial session time before. Says he was feeling to fatigued to go. Gradual SOB x2-3 days. Worsening. No cough or fever. Some nausea today. Occasional intermittent right sharp chest pain credited to sob. No exertional CP. Denies cardiac history. No lower extremity edema or pain. No h/o DVT/PE.  Per EMS, hypoxic to 80's upon arrival. Low 90's on NRB. Placed on CPAP as SOB worsened upon placing on stretcher. Given albuterol neb en route. Remains on CPAP. BP 123XX123 systolic en route. Given NTG x1 SL.  Gets dialysis through left arm fistula.  Past Medical History  Diagnosis Date  . COPD (chronic obstructive pulmonary disease)   . Crack cocaine use   . Dental caries   . HTN (hypertension) 06/12/2012  . Hematemesis/vomiting blood   . Shortness of breath     "just when I have too much potassium is too high" (05/15/2013)  . Anemia   . History of blood transfusion 10/2011; 04/2013  . Headache(784.0)     "get it from going to dialysis; when they get real bad I come to hospital" (05/15/2013)  . Arthritis     "qwhere" (05/15/2013)  . End stage renal disease 01/11/2012    Patient presented 11/04/11 to  hospital with CP, wt loss and N/V. Creat was 10, admitted. 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 ANA was + 1:80, +MPO and +pr3 Ab's (ANCA). Received plasmapheresis x 7, IV cytoxan and pred taper. First HD was 11/17/11. 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 Pt d/c'd to do outpt HD and may have gotten 1-2 additional Cytoxan as OP, but this was stopped eventually since renal function didn't recover. Now gets HD TTS schedule at [facility].  L forearm AVF 11/19/11 by Dr. Scot Dock is current access.    Marland Kitchen ESRD (end stage renal disease) on dialysis     TTS: [address] [place] (05/15/2013)  . ESRD (end stage renal disease) on dialysis   . Headache(784.0)   . Depression   . Anxiety    Past Surgical History  Procedure Laterality Date  . Av fistula placement  11/29/2011    Procedure: ARTERIOVENOUS (AV) FISTULA CREATION;  Surgeon: Angelia Mould, MD;  Location: [place];  Service: Vascular;  Laterality: Left;  . Renal biopsy  11/07/2011  . Inguinal hernia repair Bilateral 1967  . Esophagogastroduodenoscopy N/A 05/17/2013    Procedure: ESOPHAGOGASTRODUODENOSCOPY (EGD);  Surgeon: Beryle Beams, MD;  Location: [facility] ENDOSCOPY;  Service: Endoscopy;  Laterality: N/A;   Family History  Problem Relation Age of Onset  . Heart attack Father    History  Substance Use  Topics  . Smoking status: Current Every Day Smoker -- 1.00 packs/day for 35 years    Types: Cigars, Cigarettes  . Smokeless tobacco: Never Used  . Alcohol Use: No     Comment: 05/15/2013 "aien't drank in ~ 25 yrs"    Review of Systems  Constitutional: Negative for fever and chills.  HENT: Negative for congestion and rhinorrhea.   Respiratory: Positive for shortness of breath. Negative for cough.   Cardiovascular: Positive for chest pain. Negative for leg swelling.  Gastrointestinal: Positive for nausea.  Negative for vomiting, abdominal pain and diarrhea.  All other systems reviewed and are negative.     Allergies  Review of patient's allergies indicates no known allergies.  Home Medications   Prior to Admission medications   Medication Sig Start Date End Date Taking? Authorizing Provider  acetaminophen (TYLENOL) 500 MG tablet Take 1,000 mg by mouth every 6 (six) hours as needed. pain    Historical Provider, MD  amLODipine (NORVASC) 10 MG tablet Take 10 mg by mouth daily.    Historical Provider, MD  b complex vitamins tablet Take 1 tablet by mouth daily.    Historical Provider, MD  calcium carbonate, dosed in mg elemental calcium, 1250 MG/5ML Take 5 mLs (500 mg of elemental calcium total) by mouth every 6 (six) hours as needed for indigestion. 05/17/13   Maryann Mikhail, DO  carvedilol (COREG) 25 MG tablet Take 1 tablet (25 mg total) by mouth 2 (two) times daily with a meal. On Monday-Wednesday-Friday-Sunday (non-HD days); on dialysis days (Tuesday-Thursday-Saturday) take it once a day (evening time only). 07/19/13   Barton Dubois, MD  famotidine (PEPCID) 20 MG tablet Take 20 mg by mouth daily.    Historical Provider, MD  hydrOXYzine (ATARAX/VISTARIL) 25 MG tablet Take 25 mg by mouth 3 (three) times daily as needed. itching    Historical Provider, MD  levETIRAcetam (KEPPRA) 500 MG tablet Take 1 tablet (500 mg total) by mouth 2 (two) times daily. 09/19/13   Hulen Luster, DO  losartan (COZAAR) 100 MG tablet Take 100 mg by mouth at bedtime.    Historical Provider, MD  multivitamin (RENA-VIT) TABS tablet Take 1 tablet by mouth at bedtime. 11/29/11   Myriam Jacobson, PA-C  Nutritional Supplements (FEEDING SUPPLEMENT, NEPRO CARB STEADY,) LIQD Take 237 mLs by mouth 3 (three) times daily as needed (Supplement). 05/17/13   Maryann Mikhail, DO  pantoprazole (PROTONIX) 40 MG tablet Take 40 mg by mouth daily.    Historical Provider, MD  predniSONE (DELTASONE) 10 MG tablet Take 10 mg by mouth daily with  breakfast.    Historical Provider, MD  sevelamer carbonate (RENVELA) 800 MG tablet Take 800 mg by mouth 3 (three) times daily with meals.    Historical Provider, MD  VELPHORO 500 MG chewable tablet  09/11/13   Historical Provider, MD   BP 169/120  Pulse 84  Temp(Src) 98.4 F (36.9 C) (Temporal)  Resp 27  Ht 6' (1.829 m)  Wt 170 lb (77.111 kg)  BMI 23.05 kg/m2  SpO2 100% Physical Exam  Nursing note and vitals reviewed. Constitutional: He is oriented to person, place, and time. He appears well-developed and well-nourished.  Sitting up in bed. On CPAP. Speaks in full sentences.  HENT:  Head: Normocephalic and atraumatic.  Eyes: Conjunctivae are normal. Right eye exhibits no discharge. Left eye exhibits no discharge.  Neck: No tracheal deviation present.  Cardiovascular: Normal rate, regular rhythm, normal heart sounds and intact distal pulses.   Pulmonary/Chest: No stridor.  He has no wheezes. He has rales (bilateral bases).  tachypnea  Abdominal: Soft. He exhibits no distension. There is no tenderness. There is no guarding.  Musculoskeletal: He exhibits no edema and no tenderness.  LUE fistula.  Neurological: He is alert and oriented to person, place, and time.  Skin: Skin is warm and dry.  Psychiatric: He has a normal mood and affect. His behavior is normal.    ED Course  Procedures (including critical care time) Labs Review Labs Reviewed  CBC WITH DIFFERENTIAL - Abnormal; Notable for the following:    RBC 2.44 (*)    Hemoglobin 8.0 (*)    HCT 23.9 (*)    Neutrophils Relative % 80 (*)    All other components within normal limits  BASIC METABOLIC PANEL  GLUCOSE, RANDOM  CBG MONITORING, ED  I-STAT CHEM 8, ED  I-STAT TROPOININ, ED    Imaging Review Dg Chest Portable 1 View  11/04/2013   CLINICAL DATA:  SHORTNESS OF BREATH  EXAM: PORTABLE CHEST - 1 VIEW  COMPARISON:  Portable chest radiograph 09/21/2012.  FINDINGS: Low lung volumes. Cardiomegaly. Diffuse prominence of  interstitial markings, peribronchial cuffing, indistinctness of the pulmonary vasculature. There are diffuse bilateral pulmonary opacities. No focal regions of consolidation. Blunting of the left costophrenic angle. No acute osseous abnormalities.  IMPRESSION: Pulmonary edema  Small left effusion   Electronically Signed   By: Margaree Mackintosh M.D.   On: 11/04/2013 14:48     EKG Interpretation   Date/Time:  Monday November 04 2013 14:20:03 EDT Ventricular Rate:  85 PR Interval:  191 QRS Duration: 113 QT Interval:  388 QTC Calculation: 461 R Axis:   32 Text Interpretation:  Sinus rhythm Borderline intraventricular conduction  delay , decreased from prior tracing Abnormal T, consider ischemia or  hyperkalemia Confirmed by KNAPP  MD-J, JON [id number]) on 11/04/2013 2:50:23 PM      MDM   Final diagnoses:  None    SOB in setting of missed dialysis. Bibasilar rales. Hypertensive to ~180's/110's.  EKG with peaked T waves. Received albuterol en route. Given calcium gluconate. istat labs pending.   istat troponin 8.3. Given another albuterol treatment, insulin/dextrose, and bicarb.  Nephrology consulted. Arranging dialysis. Medicine consulted for admission to step down unit.  Patient reports improvement with SOB. On NRB now and tolerating well. Given NTG for hypertension and volume overload (evident on exam and CXR.   Labs and imaging reviewed by myself and considered in medical decision making if ordered. Imaging interpreted by radiology.   Discussed case with Dr. Tomi Bamberger who is in agreement with assessment and plan.      Bonnita Hollow, MD 11/04/13 [phone]

## 2013-11-04 NOTE — Consult Note (Signed)
[facility] Renal Consultation Note  Indication for Consultation:  Management of ESRD/hemodialysis; anemia, hypertension/volume and secondary hyperparathyroidism  HPI: Jeremy Mccoy is a 48 y.o. male with a history of hypertension, seizures, cocaine abuse, and ESRD on dialysis at the [facility] who missed his last dialysis on Saturday 6/20 and presented to the ED today per EMS with worsening dyspnea, chest tightness and right-side chest pain, and lower extremity weakness.  His potassium was found to be elevated at 8.3, so he was admitted for emergent dialysis.  He admitted eating high-potassium foods, such as tomatoes, bananas, and watermelon over the last few days.  His blood pressure was also significantly elevated, likely secondary to missed dialysis, but also admitted crack cocaine use over the weekend.  He was also initially hypoxic with oxygen saturation in the 80s, which has now improved on CPAP.   Dialysis Orders:   TTS @ [facility] 4:15      71.5 kg      2K/2.25Ca      450/A1.5       No Heparin        AVF @ LFA   Hectorol 1 mcg       Aranesp 50 mcg on Tues       No Venofer  Past Medical History  Diagnosis Date  . COPD (chronic obstructive pulmonary disease)   . Crack cocaine use   . Dental caries   . HTN (hypertension) 06/12/2012  . Hematemesis/vomiting blood   . Shortness of breath     "just when I have too much potassium is too high" (05/15/2013)  . Anemia   . History of blood transfusion 10/2011; 04/2013  . Headache(784.0)     "get it from going to dialysis; when they get real bad I come to hospital" (05/15/2013)  . Arthritis     "qwhere" (05/15/2013)  . End stage renal disease 01/11/2012    Patient presented 11/04/11 to hospital with CP, wt loss and N/V. Creat was 10, admitted. 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 ANA was + 1:80, +MPO and +pr3 Ab's (ANCA). Received plasmapheresis x 7, IV cytoxan and pred taper. First HD was 11/17/11. 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 Pt d/c'd to do outpt HD and may have gotten 1-2 additional Cytoxan as OP, but this was stopped eventually since renal function didn't recover. Now gets HD TTS schedule at [facility].  L forearm AVF 11/19/11 by Dr. Scot Dock is current access.    Marland Kitchen ESRD (end stage renal disease) on dialysis     TTS: [address] [place] (05/15/2013)  . ESRD (end stage renal disease) on dialysis   . Headache(784.0)   . Depression   . Anxiety    Past Surgical History  Procedure Laterality Date  . Av fistula placement  11/29/2011    Procedure: ARTERIOVENOUS (AV) FISTULA CREATION;  Surgeon: Angelia Mould, MD;  Location: [place];  Service: Vascular;  Laterality: Left;  . Renal biopsy  11/07/2011  . Inguinal hernia repair Bilateral 1967  . Esophagogastroduodenoscopy N/A 05/17/2013    Procedure: ESOPHAGOGASTRODUODENOSCOPY (EGD);  Surgeon: Beryle Beams, MD;  Location: [facility] ENDOSCOPY;  Service: Endoscopy;  Laterality: N/A;   Family History  Problem Relation Age of Onset  . Heart attack Father    Social History  He continues to smoke cigarettes, 1 1/2 packs a day, and  has a 35 pack-year smoking history. He does not drink alcohol, but continues to use crack cocaine. . No Known Allergies Prior to Admission medications   Medication Sig Start Date End Date Taking? Authorizing Provider  acetaminophen (TYLENOL) 500 MG tablet Take 1,000 mg by mouth every 6 (six) hours as needed. pain    Historical Provider, MD  amLODipine (NORVASC) 10 MG tablet Take 10 mg by mouth daily.    Historical Provider, MD  b complex vitamins tablet Take 1 tablet by mouth daily.    Historical Provider, MD  calcium carbonate, dosed in mg elemental calcium, 1250 MG/5ML Take 5 mLs (500 mg of elemental calcium total) by mouth every 6 (six) hours as needed for indigestion. 05/17/13   Maryann Mikhail,  DO  carvedilol (COREG) 25 MG tablet Take 1 tablet (25 mg total) by mouth 2 (two) times daily with a meal. On Monday-Wednesday-Friday-Sunday (non-HD days); on dialysis days (Tuesday-Thursday-Saturday) take it once a day (evening time only). 07/19/13   Barton Dubois, MD  famotidine (PEPCID) 20 MG tablet Take 20 mg by mouth daily.    Historical Provider, MD  hydrOXYzine (ATARAX/VISTARIL) 25 MG tablet Take 25 mg by mouth 3 (three) times daily as needed. itching    Historical Provider, MD  levETIRAcetam (KEPPRA) 500 MG tablet Take 1 tablet (500 mg total) by mouth 2 (two) times daily. 09/19/13   Hulen Luster, DO  losartan (COZAAR) 100 MG tablet Take 100 mg by mouth at bedtime.    Historical Provider, MD  multivitamin (RENA-VIT) TABS tablet Take 1 tablet by mouth at bedtime. 11/29/11   Myriam Jacobson, PA-C  Nutritional Supplements (FEEDING SUPPLEMENT, NEPRO CARB STEADY,) LIQD Take 237 mLs by mouth 3 (three) times daily as needed (Supplement). 05/17/13   Maryann Mikhail, DO  pantoprazole (PROTONIX) 40 MG tablet Take 40 mg by mouth daily.    Historical Provider, MD  predniSONE (DELTASONE) 10 MG tablet Take 10 mg by mouth daily with breakfast.    Historical Provider, MD  sevelamer carbonate (RENVELA) 800 MG tablet Take 800 mg by mouth 3 (three) times daily with meals.    Historical Provider, MD  VELPHORO 500 MG chewable tablet  09/11/13   Historical Provider, MD   Labs:  Results for orders placed during the hospital encounter of 11/04/13 (from the past 48 hour(s))  CBG MONITORING, ED     Status: None   Collection Time    11/04/13  2:31 PM      Result Value Ref Range   Glucose-Capillary 85  70 - 99 mg/dL  BASIC METABOLIC PANEL     Status: Abnormal   Collection Time    11/04/13  2:40 PM      Result Value Ref Range   Sodium 136 (*) 137 - 147 mEq/L   Potassium >7.7 (*) 3.7 - 5.3 mEq/L   Comment: CRITICAL RESULT CALLED TO, READ BACK BY AND VERIFIED WITH:     S [facility] 11/04/13 0336 RHOLMES   Chloride  93 (*) 96 - 112 mEq/L   CO2 21  19 - 32 mEq/L   Glucose, Bld 86  70 - 99 mg/dL   [place]a [place] 111 (*) 6 - 23 mg/dL   Creatinine, Ser 20.11 (*) 0.50 - 1.35 mg/dL   Calcium 10.3  8.4 - 10.5 mg/dL   GFR calc non Af Amer 2 (*) >90 mL/min   GFR calc Af Amer 3 (*) >90 mL/min   Comment: (NOTE)     The eGFR has been calculated  using the CKD EPI equation.     This calculation has not been validated in all clinical situations.     eGFR's persistently <90 mL/min signify possible Chronic Kidney     Disease.  CBC WITH DIFFERENTIAL     Status: Abnormal   Collection Time    11/04/13  2:40 PM      Result Value Ref Range   WBC 6.6  4.0 - 10.5 K/uL   RBC 2.44 (*) 4.22 - 5.81 MIL/uL   Hemoglobin 8.0 (*) 13.0 - 17.0 g/dL   HCT 23.9 (*) 39.0 - 52.0 %   MCV 98.0  78.0 - 100.0 fL   MCH 32.8  26.0 - 34.0 pg   MCHC 33.5  30.0 - 36.0 g/dL   RDW 13.8  11.5 - 15.5 %   Platelets 152  150 - 400 K/uL   Neutrophils Relative % 80 (*) 43 - 77 %   Neutro Abs 5.2  1.7 - 7.7 K/uL   Lymphocytes Relative 12  12 - 46 %   Lymphs Abs 0.8  0.7 - 4.0 K/uL   Monocytes Relative 7  3 - 12 %   Monocytes Absolute 0.5  0.1 - 1.0 K/uL   Eosinophils Relative 1  0 - 5 %   Eosinophils Absolute 0.1  0.0 - 0.7 K/uL   Basophils Relative 0  0 - 1 %   Basophils Absolute 0.0  0.0 - 0.1 K/uL  I-STAT TROPOININ, ED     Status: None   Collection Time    11/04/13  3:44 PM      Result Value Ref Range   Troponin i, poc 0.03  0.00 - 0.08 ng/mL   Comment 3            Comment: Due to the release kinetics of cTnI,     a negative result within the first hours     of the onset of symptoms does not rule out     myocardial infarction with certainty.     If myocardial infarction is still suspected,     repeat the test at appropriate intervals.   Constitutional: negative for chills, fatigue, fevers and sweats Ears, nose, mouth, throat, and face: negative for earaches, hoarseness, nasal congestion and sore throat Respiratory: positive for dyspnea on  exertion, negative for cough, hemoptysis and sputum Cardiovascular: positive for chest pain, chest pressure/discomfort, dyspnea and orthopnea, negative for palpitations Gastrointestinal: negative for abdominal pain, change in bowel habits, nausea and vomiting Genitourinary:negative, anuric Musculoskeletal:negative for arthralgias, back pain, myalgias and neck pain Neurological: negative for coordination problems, dizziness, gait problems, headaches and speech problems  Physical Exam: Filed Vitals:   11/04/13 1548  BP: 187/129  Pulse: 94  Temp:   Resp:      General appearance: alert, cooperative and mild distress Head: Normocephalic, without obvious abnormality Neck: no adenopathy, no carotid bruit, no JVD and supple, symmetrical, trachea midline Resp: scattered rales bilaterally Cardio: regular rate and rhythm, S1, S2 normal, no murmur, click, rub or gallop GI: soft, non-tender; bowel sounds normal; no masses,  no organomegaly Extremities: extremities normal, atraumatic, no cyanosis or edema Neurologic: Grossly normal Dialysis Access: AVF @ LFA with + bruit   Assessment/Plan: 1. Hyperkalemia - K 8.3 sec to missed HD on 6/20, HD pending. 2. Dyspnea - sec to pulmonary edema per chest x-ray, sec to missed HD. 3. ESRD - HD on TTS @ [facility]; missed Tx 6/20, ran 3 hrs or less during 3 previous Txs.  4. Hypertension/volume - BP 177/112 on outpatient Amlodipine, Losartan, & Carvedilol; wt 77/1 kg with EDW 71.5.  Attempt 5-L UF goal. 5. Anemia - Hgb 8, trending down for 3 wks, on Aranesp 50 mcg on Tues.   6. Metabolic bone disease - Ca 10.3, P 6.8, iPTH 433; Hectorol 1 mcg, Renvela 2 with meals.   Hold Hectorol. 7. Nutrition - Last Alb 4.4, renal diet, vitamin. 8. Hx seizures - on Keppra 500 bid 9. Hx cocaine abuse - for 25 yrs, ongoing.  [name],[name] 11/04/2013, 4:08 PM   Attending Nephrologist: Edrick Oh, MD

## 2013-11-04 NOTE — H&P (Addendum)
PATIENT DETAILS Name: Jeremy Mccoy Age: 48 y.o. Sex: male Date of Birth: March 16, 1966 Admit Date: 11/04/2013 [id number] L, MD   CHIEF COMPLAINT:  Shortness of breath  HPI: Jeremy Mccoy is a 48 y.o. male with a Past Medical History of end-stage renal disease on hemodialysis, history of ongoing cocaine use, seizure disorder, hypertension, noncompliance with dialysis who presents today with the above noted complaint. Per patient, he missed his last dialysis on Saturday because he felt so weak, he did not apparently completed his dialysis on Thursday, he presented to the hospital via EMS for significant shortness of breath. EMS picked up the patient from his home, he was found to have O2 saturation of around 84% on room and was very short of breath. He was also complaining of some right-sided chest pain. He was given nitroglycerin, placed on CPAP, and brought to the emergency room. In the emergency room, he was found to have hyperkalemia, and a chest x-ray suggestive of pulmonary edema. I was subsequently asked to admit this patient for further evaluation and treatment. Per patient, he has been having shortness of breath for the past 2-3 days, however it got significantly worse today, and EMS was called. He claims that, the shortness of breath has gotten slightly better after he has received bronchodilators and has been on oxygen. He claims that the shortness of breath is worse when he is flat. Patient denies any fever, cough, nausea, vomiting or diarrhea. Denies any abdominal pain. He denies any leg swelling as well.  ALLERGIES:  No Known Allergies  PAST MEDICAL HISTORY: Past Medical History  Diagnosis Date  . COPD (chronic obstructive pulmonary disease)   . Crack cocaine use   . Dental caries   . HTN (hypertension) 06/12/2012  . Hematemesis/vomiting blood   . Shortness of breath     "just when I have too much potassium is too high" (05/15/2013)  . Anemia   . History of  blood transfusion 10/2011; 04/2013  . Headache(784.0)     "get it from going to dialysis; when they get real bad I come to hospital" (05/15/2013)  . Arthritis     "qwhere" (05/15/2013)  . End stage renal disease 01/11/2012    Patient presented 11/04/11 to hospital with CP, wt loss and N/V. Creat was 10, admitted. 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 ANA was + 1:80, +MPO and +pr3 Ab's (ANCA). Received plasmapheresis x 7, IV cytoxan and pred taper. First HD was 11/17/11. 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 Pt d/c'd to do outpt HD and may have gotten 1-2 additional Cytoxan as OP, but this was stopped eventually since renal function didn't recover. Now gets HD TTS schedule at [facility].  L forearm AVF 11/19/11 by Dr. Scot Dock is current access.    Marland Kitchen ESRD (end stage renal disease) on dialysis     TTS: [address] [place] (05/15/2013)  . ESRD (end stage renal disease) on dialysis   . Headache(784.0)   . Depression   . Anxiety     PAST SURGICAL HISTORY: Past Surgical History  Procedure Laterality Date  . Av fistula placement  11/29/2011    Procedure: ARTERIOVENOUS (AV) FISTULA CREATION;  Surgeon: Angelia Mould, MD;  Location: [place];  Service: Vascular;  Laterality: Left;  . Renal biopsy  11/07/2011  . Inguinal hernia repair Bilateral 1967  .  Esophagogastroduodenoscopy N/A 05/17/2013    Procedure: ESOPHAGOGASTRODUODENOSCOPY (EGD);  Surgeon: Beryle Beams, MD;  Location: [facility] ENDOSCOPY;  Service: Endoscopy;  Laterality: N/A;    MEDICATIONS AT HOME: Prior to Admission medications   Medication Sig Start Date End Date Taking? Authorizing Provider  acetaminophen (TYLENOL) 500 MG tablet Take 1,000 mg by mouth every 6 (six) hours as needed. pain    Historical Provider, MD  amLODipine (NORVASC) 10 MG tablet Take 10 mg by mouth daily.    Historical Provider, MD  b  complex vitamins tablet Take 1 tablet by mouth daily.    Historical Provider, MD  calcium carbonate, dosed in mg elemental calcium, 1250 MG/5ML Take 5 mLs (500 mg of elemental calcium total) by mouth every 6 (six) hours as needed for indigestion. 05/17/13   Maryann Mikhail, DO  carvedilol (COREG) 25 MG tablet Take 1 tablet (25 mg total) by mouth 2 (two) times daily with a meal. On Monday-Wednesday-Friday-Sunday (non-HD days); on dialysis days (Tuesday-Thursday-Saturday) take it once a day (evening time only). 07/19/13   Barton Dubois, MD  famotidine (PEPCID) 20 MG tablet Take 20 mg by mouth daily.    Historical Provider, MD  hydrOXYzine (ATARAX/VISTARIL) 25 MG tablet Take 25 mg by mouth 3 (three) times daily as needed. itching    Historical Provider, MD  levETIRAcetam (KEPPRA) 500 MG tablet Take 1 tablet (500 mg total) by mouth 2 (two) times daily. 09/19/13   Hulen Luster, DO  losartan (COZAAR) 100 MG tablet Take 100 mg by mouth at bedtime.    Historical Provider, MD  multivitamin (RENA-VIT) TABS tablet Take 1 tablet by mouth at bedtime. 11/29/11   Myriam Jacobson, PA-C  Nutritional Supplements (FEEDING SUPPLEMENT, NEPRO CARB STEADY,) LIQD Take 237 mLs by mouth 3 (three) times daily as needed (Supplement). 05/17/13   Maryann Mikhail, DO  pantoprazole (PROTONIX) 40 MG tablet Take 40 mg by mouth daily.    Historical Provider, MD  predniSONE (DELTASONE) 10 MG tablet Take 10 mg by mouth daily with breakfast.    Historical Provider, MD  sevelamer carbonate (RENVELA) 800 MG tablet Take 800 mg by mouth 3 (three) times daily with meals.    Historical Provider, MD  VELPHORO 500 MG chewable tablet  09/11/13   Historical Provider, MD    FAMILY HISTORY: Family History  Problem Relation Age of Onset  . Heart attack Father     SOCIAL HISTORY:  reports that he has been smoking Cigars and Cigarettes.  He has a 35 pack-year smoking history. He has never used smokeless tobacco. He reports that he uses illicit  drugs (Marijuana and Cocaine). He reports that he does not drink alcohol.  REVIEW OF SYSTEMS:  Constitutional:   No  weight loss, night sweats,  Fevers, chills, fatigue.  HEENT:    No headaches, Difficulty swallowing,Tooth/dental problems,Sore throat,  No sneezing, itching, ear ache, nasal congestion, post nasal drip,   Cardio-vascular: No cswelling in lower extremities, anasarca, dizziness, palpitations  GI:  No heartburn, indigestion, abdominal pain, nausea, vomiting, diarrhea, change in  bowel habits, loss of appetite  Resp:  No excess mucus, no productive cough, No non-productive cough,  No coughing up of blood.No change in color of mucus.No chest wall deformity  Skin:  no rash or lesions.  GU:  no dysuria, change in color of urine, no urgency or frequency.  No flank pain.  Musculoskeletal: No joint pain or swelling.  No decreased range of motion.  No back pain.  Psych: No change  in mood or affect. No depression or anxiety.  No memory loss.   PHYSICAL EXAM: Blood pressure 187/129, pulse 94, temperature 98.4 F (36.9 C), temperature source Temporal, resp. rate 22, height 6' (1.829 m), weight 77.111 kg (170 lb), SpO2 100.00%.  General appearance :Awake, alert,although in acute distress, able to complete sentences . Speech Clear.HEENT: Atraumatic and Normocephalic, pupils equally reactive to light and accomodation Neck: supple, no JVD. No cervical lymphadenopathy.  Chest:Good air entry bilaterally, rales bilaterally upto mid chest CVS: S1 S2 regular,tachycardic.  Abdomen: Bowel sounds present, Non tender and not distended with no gaurding, rigidity or rebound. Extremities: B/L Lower Ext shows no edema, both legs are warm to touch Neurology:  Non focal Skin:No Rash Wounds:N/A  LABS ON ADMISSION:   Recent Labs  11/04/13 1440 11/04/13 1524  NA 136*  --   K >7.7*  --   CL 93*  --   CO2 21  --   GLUCOSE 86 79  BUN 111*  --   CREATININE 20.11*  --   CALCIUM 10.3   --    No results found for this basename: AST, ALT, ALKPHOS, BILITOT, PROT, ALBUMIN,  in the last 72 hours No results found for this basename: LIPASE, AMYLASE,  in the last 72 hours  Recent Labs  11/04/13 1440  WBC 6.6  NEUTROABS 5.2  HGB 8.0*  HCT 23.9*  MCV 98.0  PLT 152   No results found for this basename: CKTOTAL, CKMB, CKMBINDEX, TROPONINI,  in the last 72 hours No results found for this basename: DDIMER,  in the last 72 hours No components found with this basename: POCBNP,    RADIOLOGIC STUDIES ON ADMISSION: Dg Chest Portable 1 View  11/04/2013   CLINICAL DATA:  SHORTNESS OF BREATH  EXAM: PORTABLE CHEST - 1 VIEW  COMPARISON:  Portable chest radiograph 09/21/2012.  FINDINGS: Low lung volumes. Cardiomegaly. Diffuse prominence of interstitial markings, peribronchial cuffing, indistinctness of the pulmonary vasculature. There are diffuse bilateral pulmonary opacities. No focal regions of consolidation. Blunting of the left costophrenic angle. No acute osseous abnormalities.  IMPRESSION: Pulmonary edema  Small left effusion   Electronically Signed   By: Margaree Mackintosh M.D.   On: 11/04/2013 14:48   EKG: Independently reviewed. NSR  ASSESSMENT AND PLAN: Present on Admission:  . Hyperkalemia - Secondary to noncompliance to dialysis, also on losartan.  - Already has received, IV insulin, D50, sodium bicarbonate and calcium gluconate here in the emergency room, has already been seen by nephrology, arrangements have been made for urgent dialysis. Repeat chemistry panel in a.m.   . Acute Hypoxic respiratory failure - Secondary to volume overload/pulmonary edema in a setting of noncompliance to dialysis.  - Although short of breath and in some distress, not using accessory muscles and able to talk in full sentences.  - Start IV nitroglycerin, continue supportive measure with oxygen, nebulized bronchodilators, await urgent hemodialysis. Monitor in step down in interim.   . Accelerated  hypertension - Start IV nitroglycerin, suspect blood pressure will get significantly better with dialysis. Once postdialysis, can resume usual antihypertensives.  . Anemia in chronic kidney disease - Will defer to nephrology. No evidence of acute blood loss currently.   . Cocaine abuse - Unfortunately continues to use alcohol-claims that he has "cut down". He has been counseled extensively.  . 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   . Protein-calorie malnutrition, severe - Continue supplements   .  Seizure disorder - Continue Keppra   Further plan will depend as patient's clinical course evolves and further radiologic and laboratory data become available. Patient will be monitored closely.  Above noted plan was discussed with patient,he was in agreement.   DVT Prophylaxis: Prophylactic  Heparin  Code Status: Full Code  Total time spent for admission equals 45 minutes.  [facility] Pager [phone]  If 7PM-7AM, please contact night-coverage [email or web address] Password [email or web address] 11/04/2013, 4:20 PM  **Disclaimer: This note may have been dictated with voice recognition software. Similar sounding words can inadvertently be transcribed and this note may contain transcription errors which may not have been corrected upon publication of note.**

## 2013-11-04 NOTE — Consult Note (Signed)
I have seen and examined this patient and agree with the plan of care . Patient appears stable non compliant on dialysis.  WEBB,MARTIN W 11/04/2013, 6:54 PM

## 2013-11-04 NOTE — ED Notes (Signed)
Pt taken to dialysis 

## 2013-11-04 NOTE — Progress Notes (Signed)
Pt is refusing BiPAP tonight. RT made pt aware that if he changed his mind to call.

## 2013-11-04 NOTE — ED Notes (Signed)
Per EMS-Pt is dialysis pt,  Missed his treatment on Saturday, has been cutting the last few  Short b/c he gets a h/a. Today EMS picked pt up from home 84% RA was SOB. Also right sided cp. Given 1 nitro, placed on CPAP for rales bilaterally. 5 mg albuterol given. BP 177/123, 97% CPAP. EKG SR, 90 CBG

## 2013-11-04 NOTE — ED Provider Notes (Signed)
I saw and evaluated the patient, reviewed the resident's note and I agree with the findings and plan.  CRITICAL CARE Performed by: [id number] Total critical care time: 35 Critical care time was exclusive of separately billable procedures and treating other patients. Critical care was necessary to treat or prevent imminent or life-threatening deterioration. Critical care was time spent personally by me on the following activities: development of treatment plan with patient and/or surrogate as well as nursing, discussions with consultants, evaluation of patient's response to treatment, examination of patient, obtaining history from patient or surrogate, ordering and performing treatments and interventions, ordering and review of laboratory studies, ordering and review of radiographic studies, pulse oximetry and re-evaluation of patient's condition.   EKG Interpretation   Date/Time:  Monday November 04 2013 14:20:03 EDT Ventricular Rate:  85 PR Interval:  191 QRS Duration: 113 QT Interval:  388 QTC Calculation: 461 R Axis:   32 Text Interpretation:  Sinus rhythm Borderline intraventricular conduction  delay , decreased from prior tracing Abnormal T, consider ischemia or  hyperkalemia Confirmed by Kambre Messner  MD-J, Zyaira Vejar [id number]) on 11/04/2013 2:50:23 PM      Pt missed dialysis on Saturday.  Now here with worsening shortness of breath.  On exam tachypnea but no diaphoresis.  Crackles bilaterally.  Improved with cpap and ems treatment  Hyperkalemia and pulm edema.    Contacted nephrology for dialysis.  Emergent hyperkalemia treatment administered in the ED.  Stable during ED stay.  Dorie Rank, MD 11/04/13 [phone]

## 2013-11-04 NOTE — ED Notes (Signed)
Admitting MD at bedside.

## 2013-11-05 DIAGNOSIS — N186 End stage renal disease: Secondary | ICD-10-CM

## 2013-11-05 DIAGNOSIS — J810 Acute pulmonary edema: Secondary | ICD-10-CM

## 2013-11-05 DIAGNOSIS — G40909 Epilepsy, unspecified, not intractable, without status epilepticus: Secondary | ICD-10-CM

## 2013-11-05 DIAGNOSIS — F17200 Nicotine dependence, unspecified, uncomplicated: Secondary | ICD-10-CM

## 2013-11-05 DIAGNOSIS — I059 Rheumatic mitral valve disease, unspecified: Secondary | ICD-10-CM

## 2013-11-05 DIAGNOSIS — F1910 Other psychoactive substance abuse, uncomplicated: Secondary | ICD-10-CM

## 2013-11-05 DIAGNOSIS — Z992 Dependence on renal dialysis: Secondary | ICD-10-CM

## 2013-11-05 DIAGNOSIS — E43 Unspecified severe protein-calorie malnutrition: Secondary | ICD-10-CM

## 2013-11-05 LAB — CBC
HCT: 23 % — ABNORMAL LOW (ref 39.0–52.0)
Hemoglobin: 7.7 g/dL — ABNORMAL LOW (ref 13.0–17.0)
MCH: 32.4 pg (ref 26.0–34.0)
MCHC: 33.5 g/dL (ref 30.0–36.0)
MCV: 96.6 fL (ref 78.0–100.0)
Platelets: 123 10*3/uL — ABNORMAL LOW (ref 150–400)
RBC: 2.38 MIL/uL — ABNORMAL LOW (ref 4.22–5.81)
RDW: 14.1 % (ref 11.5–15.5)
WBC: 5.4 10*3/uL (ref 4.0–10.5)

## 2013-11-05 LAB — BASIC METABOLIC PANEL
BUN: 70 mg/dL — AB (ref 6–23)
CHLORIDE: 93 meq/L — AB (ref 96–112)
CO2: 26 meq/L (ref 19–32)
CREATININE: 14.13 mg/dL — AB (ref 0.50–1.35)
Calcium: 9.6 mg/dL (ref 8.4–10.5)
GFR calc Af Amer: 4 mL/min — ABNORMAL LOW (ref >90)
GFR calc non Af Amer: 4 mL/min — ABNORMAL LOW (ref >90)
Glucose, Bld: 103 mg/dL — ABNORMAL HIGH (ref 70–99)
Potassium: 6.2 mEq/L — ABNORMAL HIGH (ref 3.7–5.3)
Sodium: 137 mEq/L (ref 137–147)

## 2013-11-05 LAB — MRSA PCR SCREENING: MRSA by PCR: NEGATIVE

## 2013-11-05 LAB — ETHANOL: Alcohol, Ethyl (B): <11 mg/dL (ref 0–11)

## 2013-11-05 MED ORDER — DARBEPOETIN ALFA-POLYSORBATE 100 MCG/0.5ML IJ SOLN
100.0000 ug | INTRAMUSCULAR | Status: AC
  Administered 2013-11-06: 100 ug via INTRAVENOUS
  Filled 2013-11-05: qty 0.5

## 2013-11-05 MED ORDER — MORPHINE SULFATE 2 MG/ML IJ SOLN: 2.0000 mg | Freq: Once | INTRAMUSCULAR | Status: DC

## 2013-11-05 MED ORDER — MORPHINE SULFATE 2 MG/ML IJ SOLN
2.0000 mg | Freq: Once | INTRAMUSCULAR | Status: DC
  Filled 2013-11-05: qty 1

## 2013-11-05 MED ORDER — DARBEPOETIN ALFA-POLYSORBATE 100 MCG/0.5ML IJ SOLN
100.0000 ug | INTRAMUSCULAR | Status: DC
  Filled 2013-11-05: qty 0.5

## 2013-11-05 MED ORDER — CLONIDINE HCL 0.1 MG PO TABS
0.1000 mg | ORAL_TABLET | Freq: Once | ORAL | Status: AC
  Administered 2013-11-05: 0.1 mg via ORAL
  Filled 2013-11-05: qty 1

## 2013-11-05 MED ORDER — LABETALOL HCL 5 MG/ML IV SOLN
20.0000 mg | Freq: Once | INTRAVENOUS | Status: AC
  Administered 2013-11-05: 20 mg via INTRAVENOUS

## 2013-11-05 MED ORDER — NICOTINE 21 MG/24HR TD PT24
21.0000 mg | MEDICATED_PATCH | Freq: Every day | TRANSDERMAL | Status: DC
  Administered 2013-11-06 – 2013-11-07 (×2): 21 mg via TRANSDERMAL
  Filled 2013-11-05 (×4): qty 1

## 2013-11-05 MED ORDER — LABETALOL HCL 5 MG/ML IV SOLN
INTRAVENOUS | Status: AC
  Filled 2013-11-05: qty 4

## 2013-11-05 MED ORDER — CLONIDINE HCL 0.1 MG PO TABS
0.1000 mg | ORAL_TABLET | Freq: Two times a day (BID) | ORAL | Status: DC
  Administered 2013-11-05: 0.1 mg via ORAL
  Filled 2013-11-05 (×2): qty 1

## 2013-11-05 MED ORDER — LABETALOL HCL 5 MG/ML IV SOLN
0.5000 mg/min | INTRAVENOUS | Status: DC
  Filled 2013-11-05: qty 100

## 2013-11-05 MED ORDER — LABETALOL HCL 5 MG/ML IV SOLN
20.0000 mg | Freq: Once | INTRAVENOUS | Status: AC
  Administered 2013-11-05: 20 mg via INTRAVENOUS
  Filled 2013-11-05: qty 4

## 2013-11-05 MED ORDER — MORPHINE SULFATE 2 MG/ML IJ SOLN: 2.0000 mg | INTRAMUSCULAR | Status: DC | PRN

## 2013-11-05 MED ORDER — CLONIDINE HCL 0.2 MG PO TABS
0.2000 mg | ORAL_TABLET | Freq: Two times a day (BID) | ORAL | Status: DC
  Administered 2013-11-05 – 2013-11-06 (×3): 0.2 mg via ORAL
  Filled 2013-11-05 (×5): qty 1

## 2013-11-05 MED ORDER — HYDRALAZINE HCL 20 MG/ML IJ SOLN
10.0000 mg | INTRAMUSCULAR | Status: DC | PRN
  Administered 2013-11-05 – 2013-11-07 (×3): 10 mg via INTRAVENOUS
  Filled 2013-11-05 (×3): qty 1

## 2013-11-05 NOTE — Progress Notes (Signed)
I have seen and examined this patient and agree with the plan of care . Patient contemplating drug rehab  [facility] W 11/05/2013, 11:46 AM

## 2013-11-05 NOTE — Progress Notes (Signed)
[place] TEAM 1 - Stepdown/ICU TEAM Progress Note  ORMAL ANASTAS [id number] DOB: 10-18-1965 DOA: 11/04/2013 PCP: Leamon Arnt, MD  Admit HPI / Brief Narrative:  Jeremy Mccoy is a 48 y.o WM PMHx end-stage renal disease on  HD T/Th/Sa noncompliance with dialysist, Hx ongoing cocaine use, Seizure DO, hypertension,   Per patient, he missed his last dialysis on Saturday because he felt so weak, Did not complete dialysis on Thursday, he presented to the hospital via EMS for significant shortness of breath. EMS picked up the patient from his home, he was found to have O2 saturation of around 84% on room and was very short of breath. He was also complaining of some right-sided chest pain. He was given nitroglycerin, placed on CPAP, and brought to the emergency room. In the emergency room, he was found to have hyperkalemia, and a chest x-ray suggestive of pulmonary edema. I was subsequently asked to admit this patient for further evaluation and treatment.  Per patient, he has been having shortness of breath for the past 2-3 days, however it got significantly worse today, and EMS was called. He claims that, the shortness of breath has gotten slightly better after he has received bronchodilators and has been on oxygen. He claims that the shortness of breath is worse when he is flat.  Patient denies any fever, cough, nausea, vomiting or diarrhea. Denies any abdominal pain. He denies any leg swelling as well.   HPI/Subjective: 6/23 patient states negative CP/SOB, but does have significant headache. States his headaches usually start during his HD sessions which he claims is why he does not attend were cut short his HD session. States currently has a severe headache rated 7/10. States last use of crack cocaine was 2 days ago. States was supposed to see Dr. 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 No evidence of acute blood loss currently.     Hyperkalemia  - Secondary to noncompliance to dialysis, also on losartan. DC losartan - Improved from admission but will need HD today per nephrology    Acute Hypoxic respiratory failure  - Secondary to volume overload/pulmonary edema in a setting of noncompliance to dialysis.  - Although short of breath and in some distress, not using accessory muscles and able to talk in full sentences.  - Resolved post emergent hemodialysis   Systolic CHF -Control BP  Accelerated hypertension  - Continue  IV nitroglycerin, BP has not improved with dialysis.   -Increase clonidine to 0.2 mg BID -Hydralazine IV 10 mg q 4hr PRN SBP> 160 or DBP>100 -Patient's continued hypertension may be secondary to his severe migraine from the nitroglycerin, morphine IV 2 mg now, will start patient on morphine IV 2mg  q 4hr PRN  Cocaine abuse  -Unfortunately continues to use alcohol-claims that he has "cut down". He has been counseled extensively.  -States he would like to speak to CSW concerning inpatient/outpatient substance abuse program  Nicotine abuse -Has smoked 1 PPD x 30 years -Nicotine patch  Seizure disorder  - Continue Keppra 500 mg BID -Obtain Keppra level -Patient counseled at length concerning for sequela of continuing to abuse cocaine, alcohol to include seizure, CVA, MI, death  Migraine -  See accelerated hypertension  Protein-calorie malnutrition, severe  - Continue supplements      Code Status: FULL Family Communication: no family present at time of exam Disposition Plan: Per nephrology    Consultants: Dr. Edrick Oh (nephrology)    Procedure/Significant Events: 6/22 PCXR;Pulmonary  edema, Small left effusion 6/23 echocardiogram;Left ventricle: mild LVH.  LVEF= 50% to 55%. No regional wall motion abnormalities. -Mitral valve: moderate regurgitation. - Left atrium: severely dilated. - Right atrium: mildly dilated. .      Culture NA  Antibiotics:  NA  DVT prophylaxis: Heparin   Devices NA  LINES / TUBES:  11/29/2011 left forearm AV fistula 6/22 20ga right antecubital    Continuous Infusions: . nitroGLYCERIN 5 mcg/min (11/05/13 0600)    Objective: VITAL SIGNS: Temp: 98 F (36.7 C) (06/23 0718) Temp src: Oral (06/23 0718) BP: 152/99 mmHg (06/23 0600) Pulse Rate: 79 (06/23 0600) SPO2; 96% on room air FIO2:   Intake/Output Summary (Last 24 hours) at 11/05/13 0805 Last data filed at 11/05/13 0600  Gross per 24 hour  Intake    138 ml  Output   2267 ml  Net  -2129 ml     Exam: General: A./O. x4, moderate distress secondary to headache, No acute respiratory distress Lungs: Bibasilar crackles  Cardiovascular: Regular rate and rhythm without murmur gallop or rub normal S1 and S2 Renalbalance today;        /overall;        Creatinine ;        Hourly output   Abdomen: Nontender, nondistended, soft, bowel sounds positive, no rebound, no ascites, no appreciable mass Extremities: No significant cyanosis, clubbing, or edema bilateral lower extremities  Data Reviewed: Basic Metabolic Panel:  Recent Labs Lab 11/04/13 1440 11/04/13 1524 11/04/13 1821 11/05/13 0248  NA 136* 135*  --  137  K >7.7* >7.7* 4.5 6.2*  CL 93* 92*  --  93*  CO2 21 19  --  26  GLUCOSE 86 79  87  --  103*  BUN 111* 110*  --  70*  CREATININE 20.11* 19.96*  --  14.13*  CALCIUM 10.3 10.2  --  9.6   Liver Function Tests: No results found for this basename: AST, ALT, ALKPHOS, BILITOT, PROT, ALBUMIN,  in the last 168 hours No results found for this basename: LIPASE, AMYLASE,  in the last 168 hours No results found for this basename: AMMONIA,  in the last 168  hours CBC:  Recent Labs Lab 11/04/13 1440 11/05/13 0248  WBC 6.6 5.4  NEUTROABS 5.2  --   HGB 8.0* 7.7*  HCT 23.9* 23.0*  MCV 98.0 96.6  PLT 152 123*   Cardiac Enzymes: No results found for this basename: CKTOTAL, CKMB, CKMBINDEX, TROPONINI,  in the last 168 hours BNP (last 3 results)  Recent Labs  07/10/13 0659  PROBNP 29037.0*   CBG:  Recent Labs Lab 11/04/13 1431  GLUCAP 85    Recent Results (from the past 240 hour(s))  MRSA PCR SCREENING     Status: None   Collection Time    11/05/13  6:17 AM      Result Value Ref Range Status   MRSA by PCR NEGATIVE  NEGATIVE Final   Comment:            The GeneXpert MRSA Assay (FDA     approved for NASAL specimens     only), is one component of a     comprehensive MRSA colonization     surveillance  program. It is not     intended to diagnose MRSA     infection nor to guide or     monitor treatment for     MRSA infections.     Studies:  Recent x-ray studies have been reviewed in detail by the Attending Physician  Scheduled Meds:  Scheduled Meds: . B-complex with vitamin C  1 tablet Oral Daily  . famotidine  20 mg Oral Daily  . heparin  5,000 Units Subcutaneous 3 times per day  . levETIRAcetam  500 mg Oral BID  . pantoprazole  40 mg Oral Daily  . predniSONE  10 mg Oral Q breakfast  . sevelamer carbonate  800 mg Oral TID WC  . sodium chloride  3 mL Intravenous Q12H  . sodium chloride  3 mL Intravenous Q12H    Time spent on care of this patient: 40 mins   Allie Bossier , MD   [facility] Office  [phone] Pager - [phone]  On-Call/Text Page:      [email or web address]      password [email or web address]  If 7PM-7AM, please contact night-coverage [email or web address] Password [email or web address] 11/05/2013, 8:05 AM   LOS: 1 day

## 2013-11-05 NOTE — Progress Notes (Signed)
Subjective:  Slight headache, but better, breathing better, ended dialysis after only 2 hrs last night for worsening headache  Objective: Vital signs in last 24 hours: Temp:  [98 F (36.7 C)-98.8 F (37.1 C)] 98 F (36.7 C) (06/23 0718) Pulse Rate:  [79-114] 85 (06/23 0800) Resp:  [14-32] 17 (06/23 0800) BP: (152-199)/(99-142) 163/102 mmHg (06/23 0800) SpO2:  [86 %-100 %] 95 % (06/23 0800) FiO2 (%):  [100 %] 100 % (06/22 1830) Weight:  [77 kg (169 lb 12.1 oz)-79.2 kg (174 lb 9.7 oz)] 77 kg (169 lb 12.1 oz) (06/22 1918) Weight change:   Intake/Output from previous day: 06/22 0701 - 06/23 0700 In: 149.5 [I.V.:149.5] Out: 2267  Intake/Output this shift: Total I/O In: 371.5 [P.O.:360; I.V.:11.5] Out: -   Lab Results:  Recent Labs  11/04/13 1440 11/05/13 0248  WBC 6.6 5.4  HGB 8.0* 7.7*  HCT 23.9* 23.0*  PLT 152 123*   BMET:  Recent Labs  11/04/13 1524 11/04/13 1821 11/05/13 0248  NA 135*  --  137  K >7.7* 4.5 6.2*  CL 92*  --  93*  CO2 19  --  26  GLUCOSE 79  87  --  103*  BUN 110*  --  70*  CREATININE 19.96*  --  14.13*  CALCIUM 10.2  --  9.6   No results found for this basename: PTH,  in the last 72 hours Iron Studies: No results found for this basename: IRON, TIBC, TRANSFERRIN, FERRITIN,  in the last 72 hours  Studies/Results: Dg Chest Portable 1 View  11/04/2013   CLINICAL DATA:  SHORTNESS OF BREATH  EXAM: PORTABLE CHEST - 1 VIEW  COMPARISON:  Portable chest radiograph 09/21/2012.  FINDINGS: Low lung volumes. Cardiomegaly. Diffuse prominence of interstitial markings, peribronchial cuffing, indistinctness of the pulmonary vasculature. There are diffuse bilateral pulmonary opacities. No focal regions of consolidation. Blunting of the left costophrenic angle. No acute osseous abnormalities.  IMPRESSION: Pulmonary edema  Small left effusion   Electronically Signed   By: Margaree Mackintosh M.D.   On: 11/04/2013 14:48   EXAM: General appearance:  Alert, in no  apparent distress Resp:  Bibasilar rales Cardio:  RRR without murmur or rub GI:  + BS, soft and nontender Extremities:  No edema Access: AVF @ LFA with + bruit  Dialysis Orders: TTS @ [facility]  4:15 71.5 kg 2K/2.25Ca 450/A1.5 No Heparin AVF @ LFA  Hectorol 1 mcg Aranesp 50 mcg on Tues No Venofer   Assessment/Plan: 1. Hyperkalemia - K 8.3 yesterday sec to missed HD on 6/20, 4.5 post-HD, 6.2 today. 2. Dyspnea - sec to pulmonary edema per chest x-ray, sec to missed HD. 3. ESRD - HD on TTS @ [facility]; missed Tx 6/20, ran 3 hrs or less during 3 previous Txs; last night only 2 hrs sec to worsening HA. HD again today. 4. HTN/volume - BP 163/102 on outpatient Amlodipine, Losartan, & Carvedilol; now on IV NTG & PO Clonidine; wt 77 kg s/p net UF 2.3 L yesterday. 5. Anemia - Hgb down to 7.7, trending down for 3 wks, on Aranesp 50 mcg on Tues.  6. Metabolic bone disease - Ca 9.6, P 6.8, iPTH 433; Hectorol 1 mcg, Renvela 2 with meals. Hold Hectorol. 7. Nutrition - Last Alb 4.4, renal diet, vitamin. 8. Hx seizures - on Keppra 500 bid 9. Hx cocaine abuse - for 25 yrs, ongoing.   LOS: 1 day   Jeremy Mccoy,Jeremy Mccoy 11/05/2013,10:04 AM

## 2013-11-05 NOTE — Progress Notes (Addendum)
INITIAL NUTRITION ASSESSMENT  DOCUMENTATION CODES Per approved criteria  -Non-severe (moderate) malnutrition in the context of chronic illness   INTERVENTION:  Continue Nepro Shake 3 times daily PRN, each supplement provides 425 kcals, 19.1 gm protein RD to follow for nutrition care plan  NUTRITION DIAGNOSIS: Increased nutrient needs related to ESRD on HD as evidenced by estimated nutrition needs  Goal: Pt to meet >/= 90% of their estimated nutrition needs   Monitor:  PO & supplemental intake, weight, labs, I/O's  Reason for Assessment: Malnutrition Screening Tool Report  48 y.o. male  Admitting Dx: Acute respiratory failure  ASSESSMENT: 48 y.o. male with PMH of ESRD on hemodialysis, ongoing cocaine use, seizure disorder, hypertension, noncompliance with dialysis; presented after missing his last dialysis on Saturday because he felt so weak and had significant shortness of breath; in the ER, pt was found to have hyperkalemia and chest x-ray suggestive of pulmonary edema.    Patient reports he's eating well; PO intake 100% per flowsheet records; usually consumes 2 meals per day, however, he does report there are some days he does not eat anything; pt does enjoy Nepro Shakes, however, was told he was drinking them too much (given fluid restriction); currently has Nepro Shake ordered TID PRN.  Nutrition Focused Physical Exam:   Subcutaneous Fat:  Orbital Region: N/A Upper Arm Region: mild depletion Thoracic and Lumbar Region: N/A   Muscle:  Temple Region: mild depletion Clavicle Bone Region: mild to moderate depletion  Clavicle and Acromion Bone Region: mild to moderate depletion Scapular Bone Region: N/A  Dorsal Hand: WNL  Patellar Region: N/A Anterior Thigh Region: N/A Posterior Calf Region: N/A  Edema: none   Patient meets criteria for moderate (non-severe) malnutrition in the context of chronic ilness as evidenced by < 75% intake of estimated energy requirement for  > 1 month, mild to moderate muscle loss and subcutaneous fat loss.  Height: Ht Readings from Last 1 Encounters:  11/04/13 6' (1.829 m)    Weight: Wt Readings from Last 1 Encounters:  11/04/13 169 lb 12.1 oz (77 kg)    Ideal Body Weight: 178 lb  % Ideal Body Weight: 94%  Wt Readings from Last 10 Encounters:  11/04/13 169 lb 12.1 oz (77 kg)  10/30/13 164 lb (74.39 kg)  07/29/13 160 lb (72.576 kg)  07/18/13 147 lb 8 oz (66.906 kg)  05/16/13 166 lb 0.1 oz (75.3 kg)  05/16/13 166 lb 0.1 oz (75.3 kg)  05/16/13 166 lb 0.1 oz (75.3 kg)  09/21/12 154 lb 15.7 oz (70.3 kg)  06/14/12 150 lb 12.7 oz (68.4 kg)  04/16/12 153 lb 7 oz (69.6 kg)    Estimated dry weight (EDW): 71.5 kg -- per pt  % Usual estimated dry weight (EDW): 107%  BMI:  Body mass index is 23.02 kg/(m^2).  Estimated Nutritional Needs: Kcal: 2000-2200 Protein: 90-105 gm Fluid: 1200 ml  Skin: Intact  Diet Order: Renal/Carbohydrate Modified with 1200 ml fluid restriction   EDUCATION NEEDS: -No education needs identified at this time   Intake/Output Summary (Last 24 hours) at 11/05/13 1133 Last data filed at 11/05/13 1100  Gross per 24 hour  Intake  555.5 ml  Output   2267 ml  Net -1711.5 ml    Labs:   Recent Labs Lab 11/04/13 1440 11/04/13 1524 11/04/13 1821 11/05/13 0248  NA 136* 135*  --  137  K >7.7* >7.7* 4.5 6.2*  CL 93* 92*  --  93*  CO2 21 19  --  26  BUN 111* 110*  --  70*  CREATININE 20.11* 19.96*  --  14.13*  CALCIUM 10.3 10.2  --  9.6  GLUCOSE 86 79  87  --  103*    CBG (last 3)   Recent Labs  11/04/13 1431  GLUCAP 85    Scheduled Meds: . B-complex with vitamin C  1 tablet Oral Daily  . cloNIDine  0.1 mg Oral BID  . darbepoetin (ARANESP) injection - DIALYSIS  100 mcg Intravenous Q Tue-HD  . famotidine  20 mg Oral Daily  . heparin  5,000 Units Subcutaneous 3 times per day  . levETIRAcetam  500 mg Oral BID  . pantoprazole  40 mg Oral Daily  . predniSONE  10 mg Oral Q  breakfast  . sevelamer carbonate  800 mg Oral TID WC  . sodium chloride  3 mL Intravenous Q12H  . sodium chloride  3 mL Intravenous Q12H    Continuous Infusions: . nitroGLYCERIN 5 mcg/min (11/05/13 1100)    Past Medical History  Diagnosis Date  . COPD (chronic obstructive pulmonary disease)   . Crack cocaine use   . Dental caries   . HTN (hypertension) 06/12/2012  . Hematemesis/vomiting blood   . Shortness of breath     "just when I have too much potassium is too high" (05/15/2013)  . Anemia   . History of blood transfusion 10/2011; 04/2013  . Headache(784.0)     "get it from going to dialysis; when they get real bad I come to hospital" (05/15/2013)  . Arthritis     "qwhere" (05/15/2013)  . End stage renal disease 01/11/2012    Patient presented 11/04/11 to hospital with CP, wt loss and N/V. Creat was 10, admitted. 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 ANA was + 1:80, +MPO and +pr3 Ab's (ANCA). Received plasmapheresis x 7, IV cytoxan and pred taper. First HD was 11/17/11. 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 Pt d/c'd to do outpt HD and may have gotten 1-2 additional Cytoxan as OP, but this was stopped eventually since renal function didn't recover. Now gets HD TTS schedule at [facility].  L forearm AVF 11/19/11 by Dr. Scot Dock is current access.    Marland Kitchen ESRD (end stage renal disease) on dialysis     TTS: [address] [place] (05/15/2013)  . ESRD (end stage renal disease) on dialysis   . Headache(784.0)   . Depression   . Anxiety     Past Surgical History  Procedure Laterality Date  . Av fistula placement  11/29/2011    Procedure: ARTERIOVENOUS (AV) FISTULA CREATION;  Surgeon: Angelia Mould, MD;  Location: [facility];  Service: Vascular;  Laterality: Left;  . Renal biopsy  11/07/2011  . Inguinal hernia repair Bilateral 1967  .  Esophagogastroduodenoscopy N/A 05/17/2013    Procedure: ESOPHAGOGASTRODUODENOSCOPY (EGD);  Surgeon: Beryle Beams, MD;  Location: [facility] ENDOSCOPY;  Service: Endoscopy;  Laterality: N/A;    Arthur Holms, RD, LDN Pager #: [phone] After-Hours Pager #: [phone]

## 2013-11-05 NOTE — Progress Notes (Signed)
UR Completed Brenda Graves-Bigelow, RN,BSN [phone]  

## 2013-11-05 NOTE — Progress Notes (Signed)
  Echocardiogram 2D Echocardiogram has been performed.  Jeremy Mccoy 11/05/2013, 2:36 PM

## 2013-11-05 NOTE — Care Management Note (Unsigned)
    Page 1 of 1   11/05/2013     12:12:16 PM CARE MANAGEMENT NOTE 11/05/2013  Patient:  TORETTO, TOPPING   Account Number:  [id number]  Date Initiated:  11/05/2013  Documentation initiated by:  GRAVES-BIGELOW,Maison Agrusa  Subjective/Objective Assessment:   Pt admitted for SOB. HTN/volume - BP 163/102 on outpatient Amlodipine, Losartan, & Carvedilol; now on IV NTG & PO Clonidine; wt 77 kg s/p net UF 2.3 L yesterday.     Action/Plan:   CM will continue to monitor for disposiiton needs. CSW to consult for substance abuse resources.   Anticipated DC Date:  11/07/2013   Anticipated DC Plan:  HOME/SELF CARE  In-house referral  Clinical Social Worker      DC Planning Services  CM consult      Choice offered to / List presented to:             Status of service:  Completed, signed off Medicare Important Message given?   (If response is "NO", the following Medicare IM given date fields will be blank) Date Medicare IM given:   Date Additional Medicare IM given:    Discharge Disposition:    Per UR Regulation:  Reviewed for med. necessity/level of care/duration of stay  If discussed at [place] of Stay Meetings, dates discussed:    Comments:

## 2013-11-06 LAB — CBC
HEMATOCRIT: 21.9 % — AB (ref 39.0–52.0)
HEMOGLOBIN: 7.3 g/dL — AB (ref 13.0–17.0)
MCH: 32.2 pg (ref 26.0–34.0)
MCHC: 33.3 g/dL (ref 30.0–36.0)
MCV: 96.5 fL (ref 78.0–100.0)
PLATELETS: 140 10*3/uL — AB (ref 150–400)
RBC: 2.27 MIL/uL — AB (ref 4.22–5.81)
RDW: 14 % (ref 11.5–15.5)
WBC: 5.1 10*3/uL (ref 4.0–10.5)

## 2013-11-06 LAB — RENAL FUNCTION PANEL
Albumin: 3.4 g/dL — ABNORMAL LOW (ref 3.5–5.2)
BUN: 93 mg/dL — AB (ref 6–23)
CALCIUM: 9.7 mg/dL (ref 8.4–10.5)
CO2: 22 meq/L (ref 19–32)
CREATININE: 18.19 mg/dL — AB (ref 0.50–1.35)
Chloride: 91 mEq/L — ABNORMAL LOW (ref 96–112)
GFR calc Af Amer: 3 mL/min — ABNORMAL LOW (ref >90)
GFR, EST NON AFRICAN AMERICAN: 3 mL/min — AB (ref >90)
Glucose, Bld: 90 mg/dL (ref 70–99)
Phosphorus: 8.1 mg/dL — ABNORMAL HIGH (ref 2.3–4.6)
Potassium: 5.7 mEq/L — ABNORMAL HIGH (ref 3.7–5.3)
Sodium: 136 mEq/L — ABNORMAL LOW (ref 137–147)

## 2013-11-06 LAB — PREPARE RBC (CROSSMATCH)

## 2013-11-06 MED ORDER — LIDOCAINE HCL (PF) 1 % IJ SOLN: 5.0000 mL | INTRAMUSCULAR | Status: DC | PRN

## 2013-11-06 MED ORDER — SODIUM CHLORIDE 0.9 % IV SOLN: 100.0000 mL | INTRAVENOUS | Status: DC | PRN

## 2013-11-06 MED ORDER — LIDOCAINE-PRILOCAINE 2.5-2.5 % EX CREA: 1.0000 "application " | TOPICAL_CREAM | CUTANEOUS | Status: DC | PRN

## 2013-11-06 MED ORDER — ALTEPLASE 2 MG IJ SOLR: 2.0000 mg | Freq: Once | INTRAMUSCULAR | Status: DC | PRN

## 2013-11-06 MED ORDER — NEPRO/CARBSTEADY PO LIQD: 237.0000 mL | ORAL | Status: DC | PRN

## 2013-11-06 MED ORDER — OXYCODONE HCL 5 MG PO TABS
ORAL_TABLET | ORAL | Status: AC
Start: 2013-11-06 — End: 2013-11-06
  Filled 2013-11-06: qty 1

## 2013-11-06 MED ORDER — HEPARIN SODIUM (PORCINE) 1000 UNIT/ML DIALYSIS: 1000.0000 [IU] | INTRAMUSCULAR | Status: DC | PRN

## 2013-11-06 MED ORDER — PENTAFLUOROPROP-TETRAFLUOROETH EX AERO: 1.0000 "application " | INHALATION_SPRAY | CUTANEOUS | Status: DC | PRN

## 2013-11-06 MED ORDER — DARBEPOETIN ALFA-POLYSORBATE 100 MCG/0.5ML IJ SOLN
INTRAMUSCULAR | Status: AC
  Administered 2013-11-06: 100 ug via INTRAVENOUS
  Filled 2013-11-06: qty 0.5

## 2013-11-06 NOTE — Clinical Documentation Improvement (Signed)
  Please clarify acuity of "Systolic CHF". Thank you. Possible Clinical Conditions?  Chronic Systolic Congestive Heart Failure Acute Systolic Congestive Heart Failure Acute on Chronic Systolic Congestive Heart Failure Other Condition________________________________________ Cannot Clinically Determine  Supporting Information:  Risk Factors: Acute respiratory failure Missed Hemodialysis-Noncompliance ESRD w/Hemodialysis 6/23 progress note:  Systolic CHF -Control BP No history of CHF noted  Signs & Symptoms: Shortness of breath  Diagnostics: 6/23 Echo: EF = 50-55% 6/22 CXR: FINDINGS: Low lung volumes. Cardiomegaly. Diffuse prominence of interstitial  markings, peribronchial cuffing, indistinctness of the pulmonary vasculature. There are diffuse bilateral pulmonary opacities. No focal regions of consolidation. Blunting of the left costophrenic  angle. No acute osseous abnormalities.  IMPRESSION: Pulmonary edema Small left effusion  Treatment: Dialysis 1200 cc fluid restriction Control BP  Thank You, Estella Husk ,RN Clinical Documentation Specialist:  [place] Information Management

## 2013-11-06 NOTE — Progress Notes (Signed)
[place] TEAM 1 - Stepdown/ICU TEAM Progress Note  Jeremy Mccoy [id number] DOB: 1966-03-19 DOA: 11/04/2013 PCP: Leamon Arnt, MD  Admit HPI / Brief Narrative: 48 y.o M w a Hx end-stage renal disease on  HD T/Th/Sa, noncompliance with dialysist, ongoing cocaine use, Seizure DO, and hypertension.  Per patient, he missed his last dialysis because he felt so weak.  He did not complete dialysis on the time prior to that.  He presented to the hospital via EMS for significant shortness of breath. EMS picked up the patient from his home, he was found to have O2 saturation of around 84% on room and was very short of breath. He was also complaining of some right-sided chest pain. He was given nitroglycerin, placed on CPAP, and brought to the emergency room.   In the emergency room, he was found to have hyperkalemia, and a chest x-ray suggestive of pulmonary edema.  HPI/Subjective: Pt has no new complaints today.  He states that his HA is much improved.  He denies cp, n/v, or abdom pain.    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 -exacerbated due to noncompliance w/ HD leading to volume overload -volume control per HD   Accelerated hypertension  -due to severe volume overload in setting of HD noncompliance - improving - cont to titrate meds and follow trend w/ ongoing HD tx   Cocaine abuse  -Unfortunately continues to use cocaine - claims that he has "cut down" - has been counseled  extensively -States he would like to speak to CSW concerning inpatient/outpatient substance abuse program  Nicotine abuse -Has smoked 1 PPD x 30 years -Nicotine patch  Seizure disorder  -Continue Keppra 500 mg BID -Patient counseled at length concerning for sequela of continuing to abuse cocaine/alcohol to include seizure, CVA, MI, death  Migraine -See accelerated hypertension  Protein-calorie malnutrition, severe  -Continue supplements   Code Status: FULL Family Communication: no family present at time of exam Disposition Plan: stable for transfer to renal floor   Consultants: Dr. Edrick Oh (nephrology)  Procedure/Significant Events: 6/22 PCXR;Pulmonary edema, Small left effusion 6/23 echocardiogram;Left ventricle: mild LVH.  LVEF= 50% to 55%. No regional wall motion abnormalities. -Mitral valve: moderate regurgitation. - Left atrium: severely dilated. - Right atrium: mildly dilated.  Antibiotics:  NA  DVT prophylaxis: SQ Heparin  Objective: VITAL SIGNS: Blood pressure 143/81, pulse 91, temperature 98.9 F (37.2 C), temperature source Oral, resp. rate 18, height 6' (1.829 m), weight 73.7 kg (162 lb 7.7 oz), SpO2 96.00%.  Intake/Output Summary (Last 24 hours) at 11/06/13 1743 Last data filed at 11/06/13 1142  Gross per 24 hour  Intake    313 ml  Output   4500 ml  Net  -4187 ml   Exam: General: No acute respiratory distress Lungs: CTA th/o with no wheeze Cardiovascular: Regular rate and rhythm without murmur gallop or rub normal S1 and S2 Abdomen: Nontender, nondistended, soft, bowel sounds positive, no rebound, no ascites, no appreciable mass Extremities: No significant cyanosis, clubbing, or edema bilateral lower extremities  Data  Reviewed: Basic Metabolic Panel:  Recent Labs Lab 11/04/13 1440 11/04/13 1524 11/04/13 1821 11/05/13 0248 11/06/13 0752  NA 136* 135*  --  137 136*  K >7.7* >7.7* 4.5 6.2* 5.7*  CL 93* 92*  --  93* 91*  CO2 21 19  --   26 22  GLUCOSE 86 79  87  --  103* 90  BUN 111* 110*  --  70* 93*  CREATININE 20.11* 19.96*  --  14.13* 18.19*  CALCIUM 10.3 10.2  --  9.6 9.7  PHOS  --   --   --   --  8.1*   Liver Function Tests:  Recent Labs Lab 11/06/13 0752  ALBUMIN 3.4*   CBC:  Recent Labs Lab 11/04/13 1440 11/05/13 0248 11/06/13 0752  WBC 6.6 5.4 5.1  NEUTROABS 5.2  --   --   HGB 8.0* 7.7* 7.3*  HCT 23.9* 23.0* 21.9*  MCV 98.0 96.6 96.5  PLT 152 123* 140*   BNP (last 3 results)  Recent Labs  07/10/13 0659  PROBNP 29037.0*   CBG:  Recent Labs Lab 11/04/13 1431  GLUCAP 85    Recent Results (from the past 240 hour(s))  MRSA PCR SCREENING     Status: None   Collection Time    11/05/13  6:17 AM      Result Value Ref Range Status   MRSA by PCR NEGATIVE  NEGATIVE Final   Comment:            The GeneXpert MRSA Assay (FDA     approved for NASAL specimens     only), is one component of a     comprehensive MRSA colonization     surveillance program. It is not     intended to diagnose MRSA     infection nor to guide or     monitor treatment for     MRSA infections.    Studies:  Recent x-ray studies have been reviewed in detail by the Attending Physician  Scheduled Meds:  Scheduled Meds: . B-complex with vitamin C  1 tablet Oral Daily  . cloNIDine  0.2 mg Oral BID  . darbepoetin (ARANESP) injection - DIALYSIS  100 mcg Intravenous Q Tue-HD  . famotidine  20 mg Oral Daily  . heparin  5,000 Units Subcutaneous 3 times per day  . levETIRAcetam  500 mg Oral BID  .  morphine injection  2 mg Intravenous Once  . nicotine  21 mg Transdermal Daily  . pantoprazole  40 mg Oral Daily  . predniSONE  10 mg Oral Q breakfast  . sevelamer carbonate  800 mg Oral TID WC  . sodium chloride  3 mL Intravenous Q12H  . sodium chloride  3 mL Intravenous Q12H    Time spent on care of this patient: 35 mins  Cherene Altes, MD [facility] For Consults/Admissions - Flow Manager -  [phone] Office  [phone] Pager [phone]  On-Call/Text Page:      [email or web address]      password [facility]  11/06/2013, 5:43 PM   LOS: 2 days

## 2013-11-06 NOTE — Progress Notes (Signed)
Subjective:  Seen on dialysis, headache much better, but BP remains high  Objective: Vital signs in last 24 hours: Temp:  [97.7 F (36.5 C)-98.6 F (37 C)] 97.7 F (36.5 C) (06/24 0730) Pulse Rate:  [70-98] 88 (06/24 0900) Resp:  [11-20] 16 (06/24 0730) BP: (149-193)/(86-119) 178/115 mmHg (06/24 0900) SpO2:  [93 %-100 %] 95 % (06/24 0900) Weight:  [79.5 kg (175 lb 4.3 oz)] 79.5 kg (175 lb 4.3 oz) (06/24 0730) Weight change:   Intake/Output from previous day: 06/23 0701 - 06/24 0700 In: 848.5 [P.O.:650; I.V.:198.5] Out: -  Intake/Output this shift:   Lab Results:  Recent Labs  11/05/13 0248 11/06/13 0752  WBC 5.4 5.1  HGB 7.7* 7.3*  HCT 23.0* 21.9*  PLT 123* 140*   BMET:  Recent Labs  11/05/13 0248 11/06/13 0752  NA 137 136*  K 6.2* 5.7*  CL 93* 91*  CO2 26 22  GLUCOSE 103* 90  BUN 70* 93*  CREATININE 14.13* 18.19*  CALCIUM 9.6 9.7  ALBUMIN  --  3.4*   No results found for this basename: PTH,  in the last 72 hours [place] Studies: No results found for this basename: IRON, TIBC, TRANSFERRIN, FERRITIN,  in the last 72 hours  Studies/Results: No results found.  EXAM:  General appearance: Alert, in no apparent distress  Resp: Bibasilar rales  Cardio: RRR without murmur or rub  GI: + BS, soft and nontender  Extremities: No edema  Access: AVF @ LFA with BFR 450   Dialysis Orders: TTS @ AF  4:15 71.5 kg 2K/2.25Ca 450/A1.5 No Heparin AVF @ LFA  Hectorol 1 mcg Aranesp 50 mcg on Tues No Venofer   Assessment/Plan: 1. Hyperkalemia - K 8.3 on 6/22 sec to missed HD on 6/20, 4.5 post-HD, 5.7 today.   2. Dyspnea - sec to pulmonary edema per chest x-ray, sec to missed HD.  3. ESRD - HD on TTS @ [facility]; missed Tx 6/20, ran 3 hrs or less during 3 previous Txs; only 2 hrs 6/22 sec to worsening HA. HD again today.  4. HTN/volume - BP 176/97 on outpatient Amlodipine, Losartan, & Carvedilol; IV NTG dc'd, added Clonidine 0.2 mg bid; wt 79.5 kg with UF goal 5 L. 5. Anemia - Hgb  down to 7.3, trending down for 3 wks, on Aranesp 50 mcg on Tues.  6. Sec HPT - Ca 9.7 (10.2 corrected), P 8.1, iPTH 433; Hectorol 1 mcg, Renvela 2 with meals. Hold Hectorol.  7. Nutrition - Alb 3.4, renal diet, vitamin.  8. Hx seizures - on Keppra 500 bid  9. Hx cocaine abuse - for 25 yrs, ongoing.   LOS: 2 days   [name],[name] 11/06/2013,9:39 AM

## 2013-11-06 NOTE — Progress Notes (Signed)
I have seen and examined this patient and agree with the plan of care . Patient seen on dilalysis .appears to be doing well no issues WEBB,MARTIN W 11/06/2013, 10:03 AM

## 2013-11-07 DIAGNOSIS — I5022 Chronic systolic (congestive) heart failure: Secondary | ICD-10-CM

## 2013-11-07 DIAGNOSIS — I509 Heart failure, unspecified: Secondary | ICD-10-CM

## 2013-11-07 DIAGNOSIS — R7989 Other specified abnormal findings of blood chemistry: Secondary | ICD-10-CM

## 2013-11-07 LAB — BASIC METABOLIC PANEL
BUN: 48 mg/dL — ABNORMAL HIGH (ref 6–23)
CHLORIDE: 94 meq/L — AB (ref 96–112)
CO2: 28 meq/L (ref 19–32)
Calcium: 9.7 mg/dL (ref 8.4–10.5)
Creatinine, Ser: 10.58 mg/dL — ABNORMAL HIGH (ref 0.50–1.35)
GFR calc Af Amer: 6 mL/min — ABNORMAL LOW (ref >90)
GFR calc non Af Amer: 5 mL/min — ABNORMAL LOW (ref >90)
GLUCOSE: 131 mg/dL — AB (ref 70–99)
POTASSIUM: 5.6 meq/L — AB (ref 3.7–5.3)
SODIUM: 137 meq/L (ref 137–147)

## 2013-11-07 LAB — CBC
HEMATOCRIT: 27 % — AB (ref 39.0–52.0)
HEMOGLOBIN: 8.9 g/dL — AB (ref 13.0–17.0)
MCH: 32 pg (ref 26.0–34.0)
MCHC: 33 g/dL (ref 30.0–36.0)
MCV: 97.1 fL (ref 78.0–100.0)
Platelets: 142 10*3/uL — ABNORMAL LOW (ref 150–400)
RBC: 2.78 MIL/uL — AB (ref 4.22–5.81)
RDW: 14.4 % (ref 11.5–15.5)
WBC: 4.3 10*3/uL (ref 4.0–10.5)

## 2013-11-07 MED ORDER — CLONIDINE HCL 0.3 MG PO TABS
0.3000 mg | ORAL_TABLET | Freq: Two times a day (BID) | ORAL | Status: DC
  Administered 2013-11-07 – 2013-11-08 (×3): 0.3 mg via ORAL
  Filled 2013-11-07 (×5): qty 1

## 2013-11-07 NOTE — Clinical Social Work Psychosocial (Signed)
Clinical Social Work Department BRIEF PSYCHOSOCIAL ASSESSMENT 11/07/2013  Patient:  Jeremy Mccoy, Jeremy Mccoy     Account Number:  [id number]     Admit date:  11/04/2013  Clinical Social Worker:  Lovey Newcomer  Date/Time:  11/07/2013 04:12 PM  Referred by:  Physician  Date Referred:  11/07/2013 Referred for  Substance Abuse   Other Referral:   Interview type:  Patient Other interview type:   Patient alert and oriented at time of assessment.    PSYCHOSOCIAL DATA Living Status:  PARENTS Admitted from facility:   Level of care:   Primary support name:  Pat Primary support relationship to patient:  PARENT Degree of support available:   Support is good.    CURRENT CONCERNS Current Concerns  Substance Abuse   Other Concerns:    SOCIAL WORK ASSESSMENT / PLAN CSW met with patient at bedside to complete SA assessment. Patient states that he has a long history of cocaine abuse and would like to information on treatment options available in the community. The patient reports that he smokes crack cocaine and that this is the only drug he abuses at this time. Patient has been to [facility] treatment in the past so he is well aware of facilities available. Patient has been to [facility]. In addition to treatment he has participated in NA meetings in the past. Patient appears with flat affect and depressed mood when discussing the changes that have taken place in his life. He finds weekly HD appointments draining and states that he misses being able to work. CSW offered emotional support to patient.   Assessment/plan status:  No Further Intervention Required Other assessment/ plan:   NONE   Information/referral to community resources:   CSW provided patient with substance abuse resources and NA meeting schedule.    PATIENT'S/FAMILY'S RESPONSE TO PLAN OF CARE: Patient plans to followup with substance abuse treatment options post discharge. CSW signing off at this time.        Liz Beach MSW, [place], [place], [phone]

## 2013-11-07 NOTE — Progress Notes (Addendum)
[place] TEAM 1 - Stepdown/ICU TEAM Progress Note  Jeremy Mccoy [id number] DOB: 01/28/1966 DOA: 11/04/2013 PCP: Leamon Arnt, MD  Admit HPI / Brief Narrative:  Jeremy Mccoy is a 48 y.o WM PMHx end-stage renal disease on  HD T/Th/Sa noncompliance with dialysist, Hx ongoing cocaine use, Seizure DO, hypertension,   Per patient, he missed his last dialysis on Saturday because he felt so weak, Did not complete dialysis on Thursday, he presented to the hospital via EMS for significant shortness of breath. EMS picked up the patient from his home, he was found to have O2 saturation of around 84% on room and was very short of breath. He was also complaining of some right-sided chest pain. He was given nitroglycerin, placed on CPAP, and brought to the emergency room. In the emergency room, he was found to have hyperkalemia, and a chest x-ray suggestive of pulmonary edema. I was subsequently asked to admit this patient for further evaluation and treatment.  Per patient, he has been having shortness of breath for the past 2-3 days, however it got significantly worse today, and EMS was called. He claims that, the shortness of breath has gotten slightly better after he has received bronchodilators and has been on oxygen. He claims that the shortness of breath is worse when he is flat.  Patient denies any fever, cough, nausea, vomiting or diarrhea. Denies any abdominal pain. He denies any leg swelling as well.   HPI/Subjective: 6/25 negative CP/SOB/ headache. Patient waiting a visit from [place] to discuss substance abuse/alcohol rehabilitation program   Assessment/Plan:  ESRD on HD T/Th/Sat - Secondary to [facility] induced vasculitis,  -Non-compliant patient missed last Saturday and Thursday  -Per nephrology will have HD today   Anemia in chronic kidney disease  - Will defer to nephrology. No evidence of acute blood loss currently.     Hyperkalemia  - Secondary to noncompliance to dialysis,  also on losartan. DC losartan - Improved with HD but still mildly increased. Patient will be dialyzed today    Acute Hypoxic respiratory failure  - Resolved post emergent hemodialysis   Chronic Systolic CHF -Control BP  Accelerated hypertension  - Continue  IV nitroglycerin, BP has not improved with dialysis.   -Increase clonidine to 0.3 mg BID -Hydralazine IV 10 mg q 4hr PRN SBP> 160 or DBP>100 -Patient's severe migraine has resolved with discontinuation of nitroglycerin, has not required pain medication except for post hemodialysis. Continue morphine IV 2mg  q 4hr PRN  Cocaine abuse  -Unfortunately continues to use alcohol-claims that he has "cut down". He has been counseled extensively.  -Awaiting recommendations from [facility] regarding inpatient/outpatient substance abuse program, prior to discharge  Nicotine abuse -Has smoked 1 PPD x 30 years -Nicotine patch  Seizure disorder  - Continue Keppra 500 mg BID -Obtain Keppra level pending -Patient counseled at length concerning for sequela of continuing to abuse cocaine, alcohol to include seizure, CVA, MI, death  Migraine -Resolved, except for post hemodialysis.  Protein-calorie malnutrition, severe  - Continue supplements      Code Status: FULL Family Communication: no family present at time of exam Disposition Plan: Should be able to discharge in a.m.     Consultants: Dr. Edrick Oh (nephrology)    Procedure/Significant Events: 6/22 PCXR;Pulmonary edema, Small left effusion 6/23 echocardiogram;Left ventricle: mild LVH.  LVEF= 50% to 55%. No regional wall motion abnormalities. -Mitral valve: moderate regurgitation. - Left atrium: severely dilated. - Right atrium: mildly dilated. .      Culture  NA  Antibiotics:  NA  DVT prophylaxis: Heparin   Devices NA  LINES / TUBES:  11/29/2011 left forearm AV fistula 6/22 20ga right antecubital    Continuous Infusions: . nitroGLYCERIN Stopped (11/05/13  2000)    Objective: VITAL SIGNS: Temp: 98.4 F (36.9 C) (06/25 0815) Temp src: Oral (06/25 0815) BP: 152/99 mmHg (06/25 0815) Pulse Rate: 88 (06/25 0540) SPO2; 96% on room air FIO2:   Intake/Output Summary (Last 24 hours) at 11/07/13 0912 Last data filed at 11/06/13 1852  Gross per 24 hour  Intake    480 ml  Output   4500 ml  Net  -4020 ml     Exam: General: A./O. x4, moderate, NAD,No acute respiratory distress Lungs: Clear to auscultation bilateral   Cardiovascular: Regular rate and rhythm without murmur gallop or rub normal S1 and S2 Abdomen: Nontender, nondistended, soft, bowel sounds positive, no rebound, no ascites, no appreciable mass Extremities: No significant cyanosis, clubbing, or edema bilateral lower extremities  Data Reviewed: Basic Metabolic Panel:  Recent Labs Lab 11/04/13 1440 11/04/13 1524 11/04/13 1821 11/05/13 0248 11/06/13 0752 11/07/13 0002  NA 136* 135*  --  137 136* 137  K >7.7* >7.7* 4.5 6.2* 5.7* 5.6*  CL 93* 92*  --  93* 91* 94*  CO2 21 19  --  26 22 28   GLUCOSE 86 79  87  --  103* 90 131*  BUN 111* 110*  --  70* 93* 48*  CREATININE 20.11* 19.96*  --  14.13* 18.19* 10.58*  CALCIUM 10.3 10.2  --  9.6 9.7 9.7  PHOS  --   --   --   --  8.1*  --    Liver Function Tests:  Recent Labs Lab 11/06/13 0752  ALBUMIN 3.4*   No results found for this basename: LIPASE, AMYLASE,  in the last 168 hours No results found for this basename: AMMONIA,  in the last 168 hours CBC:  Recent Labs Lab 11/04/13 1440 11/05/13 0248 11/06/13 0752 11/07/13 0002  WBC 6.6 5.4 5.1 4.3  NEUTROABS 5.2  --   --   --   HGB 8.0* 7.7* 7.3* 8.9*  HCT 23.9* 23.0* 21.9* 27.0*  MCV 98.0 96.6 96.5 97.1  PLT 152 123* 140* 142*   Cardiac Enzymes: No results found for this basename: CKTOTAL, CKMB, CKMBINDEX, TROPONINI,  in the last 168 hours BNP (last 3 results)  Recent Labs  07/10/13 0659  PROBNP 29037.0*   CBG:  Recent Labs Lab 11/04/13 1431    GLUCAP 85    Recent Results (from the past 240 hour(s))  MRSA PCR SCREENING     Status: None   Collection Time    11/05/13  6:17 AM      Result Value Ref Range Status   MRSA by PCR NEGATIVE  NEGATIVE Final   Comment:            The GeneXpert MRSA Assay (FDA     approved for NASAL specimens     only), is one component of a     comprehensive MRSA colonization     surveillance program. It is not     intended to diagnose MRSA     infection nor to guide or     monitor treatment for     MRSA infections.     Studies:  Recent x-ray studies have been reviewed in detail by the Attending Physician  Scheduled Meds:  Scheduled Meds: . B-complex with vitamin C  1 tablet Oral  Daily  . cloNIDine  0.2 mg Oral BID  . darbepoetin (ARANESP) injection - DIALYSIS  100 mcg Intravenous Q Tue-HD  . famotidine  20 mg Oral Daily  . heparin  5,000 Units Subcutaneous 3 times per day  . levETIRAcetam  500 mg Oral BID  .  morphine injection  2 mg Intravenous Once  . nicotine  21 mg Transdermal Daily  . pantoprazole  40 mg Oral Daily  . predniSONE  10 mg Oral Q breakfast  . sevelamer carbonate  800 mg Oral TID WC    Time spent on care of this patient: 40 mins   Allie Bossier , MD   [facility] Office  [phone] Pager - [phone]  On-Call/Text Page:      [email or web address]      password [email or web address]  If 7PM-7AM, please contact night-coverage [email or web address] Password [email or web address] 11/07/2013, 9:12 AM   LOS: 3 days

## 2013-11-07 NOTE — Progress Notes (Signed)
[facility] ROUNDING NOTE   Subjective:   Interval History: birthday today no headache  Want to participate in drug rehab  Objective:  Vital signs in last 24 hours:  Temp:  [97.8 F (36.6 C)-98.9 F (37.2 C)] 97.8 F (36.6 C) (06/25 1200) Pulse Rate:  [70-117] 88 (06/25 0540) Resp:  [12-24] 18 (06/25 0540) BP: (143-186)/(81-109) 152/99 mmHg (06/25 0815) SpO2:  [92 %-100 %] 100 % (06/25 0540)  Weight change:  Filed Weights   11/04/13 1918 11/06/13 0730 11/06/13 1142  Weight: 77 kg (169 lb 12.1 oz) 79.5 kg (175 lb 4.3 oz) 73.7 kg (162 lb 7.7 oz)    Intake/Output: I/O last 3 completed shifts: In: 540 [P.O.:480; I.V.:60] Out: 4500 [Other:4500]   Intake/Output this shift:     General appearance: Alert, in no apparent distress  Resp: Bibasilar rales  Cardio: RRR without murmur or rub  GI: + BS, soft and nontender  Extremities: No edema  Access: AVF @ LFA with BFR 450     Basic Metabolic Panel:  Recent Labs Lab 11/04/13 1440 11/04/13 1524 11/04/13 1821 11/05/13 0248 11/06/13 0752 11/07/13 0002  NA 136* 135*  --  137 136* 137  K >7.7* >7.7* 4.5 6.2* 5.7* 5.6*  CL 93* 92*  --  93* 91* 94*  CO2 21 19  --  26 22 28   GLUCOSE 86 79  87  --  103* 90 131*  BUN 111* 110*  --  70* 93* 48*  CREATININE 20.11* 19.96*  --  14.13* 18.19* 10.58*  CALCIUM 10.3 10.2  --  9.6 9.7 9.7  PHOS  --   --   --   --  8.1*  --     Liver Function Tests:  Recent Labs Lab 11/06/13 0752  ALBUMIN 3.4*   No results found for this basename: LIPASE, AMYLASE,  in the last 168 hours No results found for this basename: AMMONIA,  in the last 168 hours  CBC:  Recent Labs Lab 11/04/13 1440 11/05/13 0248 11/06/13 0752 11/07/13 0002  WBC 6.6 5.4 5.1 4.3  NEUTROABS 5.2  --   --   --   HGB 8.0* 7.7* 7.3* 8.9*  HCT 23.9* 23.0* 21.9* 27.0*  MCV 98.0 96.6 96.5 97.1  PLT 152 123* 140* 142*    Cardiac Enzymes: No results found for this basename: CKTOTAL, CKMB, CKMBINDEX,  TROPONINI,  in the last 168 hours  BNP: No components found with this basename: POCBNP,   CBG:  Recent Labs Lab 11/04/13 1431  [place] 85    Microbiology: Results for orders placed during the hospital encounter of 11/04/13  MRSA PCR SCREENING     Status: None   Collection Time    11/05/13  6:17 AM      Result Value Ref Range Status   MRSA by PCR NEGATIVE  NEGATIVE Final   Comment:            The GeneXpert MRSA Assay (FDA     approved for NASAL specimens     only), is one component of a     comprehensive MRSA colonization     surveillance program. It is not     intended to diagnose MRSA     infection nor to guide or     monitor treatment for     MRSA infections.    Coagulation Studies: No results found for this basename: LABPROT, INR,  in the last 72 hours  Urinalysis: No results found for this basename:  COLORURINE, APPERANCEUR, LABSPEC, [place], GLUCOSEU, HGBUR, BILIRUBINUR, KETONESUR, PROTEINUR, UROBILINOGEN, NITRITE, LEUKOCYTESUR,  in the last 72 hours    Imaging: No results found.   Medications:   . nitroGLYCERIN Stopped (11/05/13 2000)   . B-complex with vitamin C  1 tablet Oral Daily  . cloNIDine  0.3 mg Oral BID  . darbepoetin (ARANESP) injection - DIALYSIS  100 mcg Intravenous Q Tue-HD  . famotidine  20 mg Oral Daily  . heparin  5,000 Units Subcutaneous 3 times per day  . levETIRAcetam  500 mg Oral BID  .  morphine injection  2 mg Intravenous Once  . nicotine  21 mg Transdermal Daily  . pantoprazole  40 mg Oral Daily  . predniSONE  10 mg Oral Q breakfast  . sevelamer carbonate  800 mg Oral TID WC   acetaminophen, acetaminophen, albuterol, calcium carbonate (dosed in mg elemental calcium), guaiFENesin-dextromethorphan, hydrALAZINE, hydrOXYzine, morphine injection, morphine injection, ondansetron (ZOFRAN) IV, ondansetron, oxyCODONE  Assessment/ Plan:  1. Hyperkalemia - K 5.6  Could hold until Saturday We shall follow 2. Dyspnea - sec to pulmonary  edema per chest x-ray, sec to missed HD.  3. ESRD - HD on TTS @ AF; missed Tx 6/20  4. HTN/volume - BP 176/97 on outpatient Amlodipine, Losartan, & Carvedilol; IV NTG dc'd, added Clonidine 0.2 mg bid; wt 79.5 kg with UF goal 5 L. 5. Anemia - Hgb down to 8.9 trending down for 3 wks, on Aranesp 50 mcg on Tues.  6. Sec HPT - Ca 9.7 (10.2 corrected), P 8.1, iPTH 433; Hectorol 1 mcg, Renvela 2 with meals. Hold Hectorol.  7. Nutrition - Alb 3.4, renal diet, vitamin.  8. Hx seizures - on Keppra 500 bid  9. Hx cocaine abuse - for 25 yrs, ongoing.  We shall evaluate for dialysis tomorrow but would like to keep on schedule    LOS: 3 WEBB,MARTIN W @TODAY @12 :35 PM

## 2013-11-07 NOTE — Progress Notes (Signed)
Admission note:  Arrival Method: Wheelchair by [employer]  Mental Orientation: alert and oriented x 4 Telemetry: N/A Assessment: Completed. Systems WNL except for fine crackles in bases of pt's lungs   Skin: no issues   IV: right forearm that is saline locked  Pain: pt reports no pain at this time  Tubes: none  Safety Measures:Discussed the Fall Prevention worksheet. Left at bedside  Admission: Completed and admission orders have been written  6E Orientation: Patient has been oriented to the unit, staff and to the room.

## 2013-11-08 MED ORDER — CLONIDINE HCL 0.3 MG PO TABS: 0.3000 mg | ORAL_TABLET | Freq: Two times a day (BID) | ORAL | Status: DC

## 2013-11-08 MED ORDER — CLONIDINE HCL 0.3 MG PO TABS
0.3000 mg | ORAL_TABLET | Freq: Three times a day (TID) | ORAL | Status: DC
  Administered 2013-11-08: 0.3 mg via ORAL
  Filled 2013-11-08 (×3): qty 1

## 2013-11-08 MED ORDER — CLONIDINE HCL 0.2 MG PO TABS
0.3000 mg | ORAL_TABLET | Freq: Two times a day (BID) | ORAL | Status: DC
Start: 2013-11-08 — End: 2013-11-08

## 2013-11-08 MED ORDER — GUAIFENESIN-DM 100-10 MG/5ML PO SYRP: 5.0000 mL | ORAL_SOLUTION | ORAL | Status: DC | PRN

## 2013-11-08 MED ORDER — CLONIDINE HCL 0.2 MG PO TABS: 0.2000 mg | ORAL_TABLET | Freq: Two times a day (BID) | ORAL | Status: DC

## 2013-11-08 MED ORDER — NICOTINE 21 MG/24HR TD PT24: 21.0000 mg | MEDICATED_PATCH | Freq: Every day | TRANSDERMAL | Status: DC

## 2013-11-08 NOTE — Plan of Care (Signed)
Problem: Phase I Progression Outcomes Goal: Hemodynamically stable Outcome: Not Progressing Filed Vitals:    11/07/13 2040  BP: 158/86  Pulse: 87  Temp: 98.5 F (36.9 C)  Resp: 17   BP is still elevated.

## 2013-11-08 NOTE — Discharge Summary (Addendum)
Physician Discharge Summary  Jeremy Mccoy MRN: [id number] DOB/AGE: 1966-04-08 48 y.o.  PCP: Leamon Arnt, MD   Admit date: 11/04/2013 Discharge date: 11/08/2013  Discharge Diagnoses:      Acute respiratory failure Active Problems:   End stage renal disease   Cocaine abuse   Anemia in chronic kidney disease   Protein-calorie malnutrition, severe   Hyperkalemia   Accelerated hypertension   Seizure disorder   ESRD on hemodialysis  Follow up recommendations Followup with PCP in 1 week Next hemodialysis on 6/27 Followup in nephrology in 1-2 weeks    Medication List         acetaminophen 500 MG tablet  Commonly known as:  TYLENOL  Take 1,000 mg by mouth every 6 (six) hours as needed. pain     amLODipine 10 MG tablet  Commonly known as:  NORVASC  Take 10 mg by mouth daily.     b complex vitamins tablet  Take 1 tablet by mouth daily.     carvedilol 25 MG tablet  Commonly known as:  COREG  Take 1 tablet (25 mg total) by mouth 2 (two) times daily with a meal. On Monday-Wednesday-Friday-Sunday (non-HD days); on dialysis days (Tuesday-Thursday-Saturday) take it once a day (evening time only).     cloNIDine 0.2 MG tablet  Commonly known as:  CATAPRES  Take 1 tablet (0.3 mg total) by mouth 2 (two) times daily.     diphenhydrAMINE 25 MG tablet  Commonly known as:  BENADRYL  Take 25 mg by mouth every 6 (six) hours as needed for itching.     famotidine 20 MG tablet  Commonly known as:  PEPCID  Take 20 mg by mouth daily.     feeding supplement (NEPRO CARB STEADY) Liqd  Take 237 mLs by mouth 3 (three) times daily as needed (Supplement).     guaiFENesin-dextromethorphan 100-10 MG/5ML syrup  Commonly known as:  ROBITUSSIN DM  Take 5 mLs by mouth every 4 (four) hours as needed for cough.     hydrOXYzine 25 MG tablet  Commonly known as:  ATARAX/VISTARIL  Take 25 mg by mouth 3 (three) times daily. itching     levETIRAcetam 500 MG tablet  Commonly known as:   KEPPRA  Take 1 tablet (500 mg total) by mouth 2 (two) times daily.     losartan 100 MG tablet  Commonly known as:  COZAAR  Take 100 mg by mouth at bedtime.     multivitamin Tabs tablet  Take 1 tablet by mouth at bedtime.     nicotine 21 mg/24hr patch  Commonly known as:  NICODERM CQ - dosed in mg/24 hours  Place 1 patch (21 mg total) onto the skin daily.     pantoprazole 40 MG tablet  Commonly known as:  PROTONIX  Take 40 mg by mouth daily.     predniSONE 10 MG tablet  Commonly known as:  DELTASONE  Take 10 mg by mouth daily with breakfast.     sevelamer carbonate 800 MG tablet  Commonly known as:  RENVELA  Take 800 mg by mouth 2 (two) times daily.     VELPHORO 500 MG chewable tablet  Generic drug:  sucroferric oxyhydroxide  Chew 500 mg by mouth 2 (two) times daily.        Discharge Condition: *Stable Disposition: 01-Home or Self Care   Consults: Nephrology  Significant Diagnostic Studies: Dg Chest Portable 1 View  11/04/2013   CLINICAL DATA:  SHORTNESS OF BREATH  EXAM:  PORTABLE CHEST - 1 VIEW  COMPARISON:  Portable chest radiograph 09/21/2012.  FINDINGS: Low lung volumes. Cardiomegaly. Diffuse prominence of interstitial markings, peribronchial cuffing, indistinctness of the pulmonary vasculature. There are diffuse bilateral pulmonary opacities. No focal regions of consolidation. Blunting of the left costophrenic angle. No acute osseous abnormalities.  IMPRESSION: Pulmonary edema  Small left effusion   Electronically Signed   By: Margaree Mackintosh M.D.   On: 11/04/2013 14:48      Microbiology: Recent Results (from the past 240 hour(s))  MRSA PCR SCREENING     Status: None   Collection Time    11/05/13  6:17 AM      Result Value Ref Range Status   MRSA by PCR NEGATIVE  NEGATIVE Final   Comment:            The GeneXpert MRSA Assay (FDA     approved for NASAL specimens     only), is one component of a     comprehensive MRSA colonization     surveillance program. It  is not     intended to diagnose MRSA     infection nor to guide or     monitor treatment for     MRSA infections.     Labs: Results for orders placed during the hospital encounter of 11/04/13 (from the past 48 hour(s))  TYPE AND SCREEN     Status: None   Collection Time    11/06/13  9:50 AM      Result Value Ref Range   ABO/RH(D) O POS     Antibody Screen POS     Sample Expiration 11/09/2013     DAT, IgG NEG     Antibody Identification ANTI E ANTI c ANTI S     PT AG Type NEGATIVE FOR S ANTIGEN     Unit Number [id number]     Blood Component Type RED CELLS,LR     Unit division 00     Status of Unit ISSUED,FINAL     Donor AG Type       Value: NEGATIVE FOR E ANTIGEN NEGATIVE FOR c ANTIGEN NEGATIVE FOR S ANTIGEN   Transfusion Status OK TO TRANSFUSE     Crossmatch Result COMPATIBLE     Unit Number [id number]     Blood Component Type RED CELLS,LR     Unit division 00     Status of Unit ALLOCATED     Donor AG Type       Value: NEGATIVE FOR E ANTIGEN NEGATIVE FOR c ANTIGEN NEGATIVE FOR S ANTIGEN   Transfusion Status OK TO TRANSFUSE     Crossmatch Result COMPATIBLE    PREPARE RBC (CROSSMATCH)     Status: None   Collection Time    11/06/13  9:58 AM      Result Value Ref Range   Order Confirmation ORDER PROCESSED BY BLOOD BANK    BASIC METABOLIC PANEL     Status: Abnormal   Collection Time    11/07/13 12:02 AM      Result Value Ref Range   Sodium 137  137 - 147 mEq/L   Potassium 5.6 (*) 3.7 - 5.3 mEq/L   Chloride 94 (*) 96 - 112 mEq/L   CO2 28  19 - 32 mEq/L   Glucose, Bld 131 (*) 70 - 99 mg/dL   BUN 48 (*) 6 - 23 mg/dL   Comment: DELTA CHECK NOTED   Creatinine, Ser 10.58 (*) 0.50 - 1.35 mg/dL   Comment: DELTA  CHECK NOTED   Calcium 9.7  8.4 - 10.5 mg/dL   GFR calc non Af Amer 5 (*) >90 mL/min   GFR calc Af Amer 6 (*) >90 mL/min   Comment: (NOTE)     The eGFR has been calculated using the CKD EPI equation.     This calculation has not been validated in all clinical  situations.     eGFR's persistently <90 mL/min signify possible Chronic Kidney     Disease.  CBC     Status: Abnormal   Collection Time    11/07/13 12:02 AM      Result Value Ref Range   WBC 4.3  4.0 - 10.5 K/uL   RBC 2.78 (*) 4.22 - 5.81 MIL/uL   Hemoglobin 8.9 (*) 13.0 - 17.0 g/dL   Comment: REPEATED TO VERIFY   HCT 27.0 (*) 39.0 - 52.0 %   MCV 97.1  78.0 - 100.0 fL   MCH 32.0  26.0 - 34.0 pg   MCHC 33.0  30.0 - 36.0 g/dL   RDW 14.4  11.5 - 15.5 %   Platelets 142 (*) 150 - 400 K/uL     HPI : Jeremy Mccoy is a 48 y.o WM PMHx end-stage renal disease on HD T/Th/Sa noncompliance with dialysist, Hx ongoing cocaine use, Seizure DO, hypertension,  Per patient, he missed his last dialysis on Saturday because he felt so weak, Did not complete dialysis on Thursday, he presented to the hospital via EMS for significant shortness of breath. EMS picked up the patient from his home, he was found to have O2 saturation of around 84% on room and was very short of breath. He was also complaining of some right-sided chest pain. He was given nitroglycerin, placed on CPAP, and brought to the emergency room. In the emergency room, he was found to have hyperkalemia, and a chest x-ray suggestive of pulmonary edema.   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 Next hemodialysis 6/27  Anemia in chronic kidney disease  - Hgb down to 8.9 trending down for 3 wks, on Aranesp 50 mcg on Tues  Hyperkalemia  - Secondary to noncompliance to dialysis,   - Improved with HD but still mildly increased. Repeat BMP in one week  Acute Hypoxic respiratory failure  -Secondary to pulmonary edema on the chest x-ray, Resolved post emergent hemodialysis    Chronic Systolic CHF  Uncontrolled during this hospitalization,  outpatient medications include Norvasc, losartan, Coreg, nephrology recommended to add clonidine 0.2 mg twice a day   Accelerated hypertension  - Continue IV nitroglycerin, BP has not improved with dialysis.  Started on  clonidine to 0.2 mg BID  -Patient was treated with hydralazine intravenously inpatient  -He developed a headache secondary to nitroglycerin which has resolved with discontinuation of nitroglycerin,    Cocaine abuse  -Unfortunately continues to use alcohol-claims that he has "cut down". He has been counseled extensively.  -Awaiting recommendations from [place] regarding inpatient/outpatient substance abuse program, prior to discharge   Nicotine abuse  -Has smoked 1 PPD x 30 years  -Nicotine patch    Seizure disorder  - Continue Keppra 500 mg BID  - Keppra level pending  -Patient counseled at length concerning for sequela of continuing to abuse cocaine, alcohol to include seizure, CVA, MI, death is   Migraine  -Resolved,  except for post hemodialysis.   Protein-calorie malnutrition, severe  - Continue supplements  Code Status: FULL    Discharge Exam:  Blood pressure 150/110, pulse 69, temperature 97.4 F (36.3 C), temperature source Oral, resp. rate 17, height 6' (1.829 m), weight 76.3 kg (168 lb 3.4 oz), SpO2 98.00%. General appearance: Alert, in no apparent distress  Resp: Bibasilar rales  Cardio: RRR without murmur or rub  GI: + BS, soft and nontender  Extremities: No edema  Access: AVF @ LFA with BFR 450            Discharge Instructions   Diet - low sodium heart healthy    Complete by:  As directed      Increase activity slowly    Complete by:  As directed            Follow-up Information   Follow up with ANDY,CAMILLE L, MD In 1 week.   Specialty:  Family Medicine   Contact information:   [address] [place] [address] [phone]       Follow up with Windy Kalata, MD In 1 week.   Specialty:  Nephrology    Contact information:   [address] [place] [address] [phone]       Signed: Reyne Dumas 11/08/2013, 9:02 AM

## 2013-11-08 NOTE — Progress Notes (Signed)
I have seen and examined this patient and agree with the plan of care . Much improved dialysis in AM [facility] W 11/08/2013, 1:38 PM

## 2013-11-08 NOTE — Progress Notes (Signed)
Pt very upset that BP is elevated. Manual BP is 196/96. Pt is unhappy with PRN hydralazine. Pt keeps calling medication hydroxyzine. Provided education between the difference between hydralazine and hydroxyzine. Pt refused education and started escalating aggressive verbal behavior. "Pt stated, "I'm going home today no matter what. 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 Nikki Therapist, sports provided 1:1 therapeutic communication and deescalated pt's behavior. Velora Mediate

## 2013-11-08 NOTE — Progress Notes (Signed)
Patient was discharged home with family. Patient was given discharge education and information and details on new prescriptions. Patient was discharged with belongings. Patient was stable upon discharge and declined wheelchair or aid in walking out of hospital.

## 2013-11-08 NOTE — Progress Notes (Signed)
[facility] Progress Note  Subjective:   No emerging complaints. Still only "contemplating" drug rehab. Home today  Objective Filed Vitals:   11/08/13 0551 11/08/13 0629 11/08/13 0813 11/08/13 1010  BP: 169/107 196/96 150/110 164/102  Pulse: 69   81  Temp: 97.4 F (36.3 C)   97.7 F (36.5 C)  TempSrc:    Oral  Resp: 17   18  Height:      Weight:      SpO2: 98%   98%   Physical Exam General: Alert, cooperative, NAD Heart: RRR Lungs: CTA bilat, no wheezes or rhonchi Abdomen: Soft, NT, + BS Extremities: No LE edema Dialysis Access: LFA AVF + bruit  Dialysis Orders: TTS @ AF  4:15 71.5 kg 2K/2.25Ca 450/A1.5 No Heparin AVF @ LFA  Hectorol 1 mcg Aranesp 50 mcg on Tues No Venofer   Assessment/Plan: 1. Hyperkalemia - Resolved. Plan HD outpatient tomorrow. 2. Dyspnea - Resolved.   3. ESRD - HD off schedule here. Resume TTS 4. HTN/volume - SBPs 150s-160s on Clonidine 0.3mg  BID here. Home doses of clonidine, amlodipine, losartan, & carvedilol to be resumed at discharge per primary.  5. Anemia - Hgb down to 8.9 trending down for 3 wks. Aranesp 100 here. Continue outpatient. 6. Sec HPT - Ca 9.7 (10.2 corrected), P 8.1, iPTH 433; Hectorol 1 mcg, Renvela 2 with meals.  Hectorol on hold. Follow Ca  7. Nutrition - Alb 3.4, renal diet, vitamin.  8. Hx seizures - on Keppra 500 bid  9. Hx cocaine abuse - for 25 yrs, ongoing. Contemplating returning to rehab. Has completed programs in the past but his health and social circumstances are vastly different. Needs new coping strategies. 10. Dispo - For d/c today per primary   Santiago Glad E. Rhodia Albright [facility] Pager [phone] 11/08/2013,10:51 AM  LOS: 4 days    Additional Objective Labs: Basic Metabolic Panel:  Recent Labs Lab 11/05/13 0248 11/06/13 0752 11/07/13 0002  NA 137 136* 137  K 6.2* 5.7* 5.6*  CL 93* 91* 94*  CO2 26 22 28   GLUCOSE 103* 90 131*  BUN 70* 93* 48*  CREATININE 14.13* 18.19*  10.58*  CALCIUM 9.6 9.7 9.7  PHOS  --  8.1*  --    Liver Function Tests:  Recent Labs Lab 11/06/13 0752  ALBUMIN 3.4*   CBC:  Recent Labs Lab 11/04/13 1440 11/05/13 0248 11/06/13 0752 11/07/13 0002  WBC 6.6 5.4 5.1 4.3  NEUTROABS 5.2  --   --   --   HGB 8.0* 7.7* 7.3* 8.9*  HCT 23.9* 23.0* 21.9* 27.0*  MCV 98.0 96.6 96.5 97.1  PLT 152 123* 140* 142*   Blood Culture    Component Value Date/Time   SDES ENDOTRACHEAL 07/10/2013 1344   SPECREQUEST TRACHEAL ASPIRATE 07/10/2013 1344   CULT  Value: ABUNDANT KLEBSIELLA OXYTOCA Performed at [facility] 07/10/2013 1344   REPTSTATUS 07/13/2013 FINAL 07/10/2013 1344    CBG:  Recent Labs Lab 11/04/13 1431  GLUCAP 85   Studies/Results: No results found.  Medications: . nitroGLYCERIN Stopped (11/05/13 2000)   . B-complex with vitamin C  1 tablet Oral Daily  . cloNIDine  0.3 mg Oral TID  . darbepoetin (ARANESP) injection - DIALYSIS  100 mcg Intravenous Q Tue-HD  . famotidine  20 mg Oral Daily  . heparin  5,000 Units Subcutaneous 3 times per day  . levETIRAcetam  500 mg Oral BID  .  morphine injection  2 mg Intravenous Once  .  nicotine  21 mg Transdermal Daily  . pantoprazole  40 mg Oral Daily  . predniSONE  10 mg Oral Q breakfast  . sevelamer carbonate  800 mg Oral TID WC

## 2013-11-09 LAB — LEVETIRACETAM LEVEL: Levetiracetam Lvl: 23.4 ug/mL

## 2013-11-10 LAB — TYPE AND SCREEN
ABO/RH(D): O POS
Antibody Screen: POSITIVE
DAT, IgG: NEGATIVE
PT AG TYPE: NEGATIVE
UNIT DIVISION: 0
Unit division: 0

## 2014-01-21 ENCOUNTER — Encounter (HOSPITAL_COMMUNITY): Payer: Self-pay | Admitting: *Deleted

## 2014-01-21 ENCOUNTER — Emergency Department (HOSPITAL_COMMUNITY): Payer: Medicare Other

## 2014-01-21 ENCOUNTER — Inpatient Hospital Stay (HOSPITAL_COMMUNITY)
Admission: EM | Admit: 2014-01-21 | Discharge: 2014-01-25 | DRG: 682 | Disposition: A | Discharge status: Home / Self Care | Payer: Medicare Other | Attending: Internal Medicine | Admitting: Internal Medicine

## 2014-01-21 DIAGNOSIS — J9600 Acute respiratory failure, unspecified whether with hypoxia or hypercapnia: Secondary | ICD-10-CM | POA: Diagnosis present

## 2014-01-21 DIAGNOSIS — D696 Thrombocytopenia, unspecified: Secondary | ICD-10-CM | POA: Diagnosis present

## 2014-01-21 DIAGNOSIS — D649 Anemia, unspecified: Secondary | ICD-10-CM | POA: Diagnosis present

## 2014-01-21 DIAGNOSIS — Z91199 Patient's noncompliance with other medical treatment and regimen due to unspecified reason: Secondary | ICD-10-CM | POA: Diagnosis not present

## 2014-01-21 DIAGNOSIS — IMO0002 Reserved for concepts with insufficient information to code with codable children: Secondary | ICD-10-CM | POA: Diagnosis not present

## 2014-01-21 DIAGNOSIS — D631 Anemia in chronic kidney disease: Secondary | ICD-10-CM | POA: Diagnosis present

## 2014-01-21 DIAGNOSIS — K5900 Constipation, unspecified: Secondary | ICD-10-CM | POA: Diagnosis not present

## 2014-01-21 DIAGNOSIS — F1210 Cannabis abuse, uncomplicated: Secondary | ICD-10-CM | POA: Diagnosis present

## 2014-01-21 DIAGNOSIS — K219 Gastro-esophageal reflux disease without esophagitis: Secondary | ICD-10-CM | POA: Diagnosis present

## 2014-01-21 DIAGNOSIS — J9690 Respiratory failure, unspecified, unspecified whether with hypoxia or hypercapnia: Secondary | ICD-10-CM | POA: Diagnosis present

## 2014-01-21 DIAGNOSIS — I10 Essential (primary) hypertension: Secondary | ICD-10-CM

## 2014-01-21 DIAGNOSIS — Z720 Tobacco use: Secondary | ICD-10-CM | POA: Diagnosis present

## 2014-01-21 DIAGNOSIS — L299 Pruritus, unspecified: Secondary | ICD-10-CM | POA: Diagnosis present

## 2014-01-21 DIAGNOSIS — E872 Acidosis, unspecified: Secondary | ICD-10-CM | POA: Diagnosis present

## 2014-01-21 DIAGNOSIS — N039 Chronic nephritic syndrome with unspecified morphologic changes: Secondary | ICD-10-CM

## 2014-01-21 DIAGNOSIS — F17200 Nicotine dependence, unspecified, uncomplicated: Secondary | ICD-10-CM | POA: Diagnosis present

## 2014-01-21 DIAGNOSIS — Z9115 Patient's noncompliance with renal dialysis: Secondary | ICD-10-CM

## 2014-01-21 DIAGNOSIS — Z79899 Other long term (current) drug therapy: Secondary | ICD-10-CM

## 2014-01-21 DIAGNOSIS — N2581 Secondary hyperparathyroidism of renal origin: Secondary | ICD-10-CM | POA: Diagnosis present

## 2014-01-21 DIAGNOSIS — G40401 Other generalized epilepsy and epileptic syndromes, not intractable, with status epilepticus: Secondary | ICD-10-CM | POA: Diagnosis present

## 2014-01-21 DIAGNOSIS — E875 Hyperkalemia: Secondary | ICD-10-CM | POA: Diagnosis present

## 2014-01-21 DIAGNOSIS — I4891 Unspecified atrial fibrillation: Secondary | ICD-10-CM | POA: Diagnosis present

## 2014-01-21 DIAGNOSIS — I498 Other specified cardiac arrhythmias: Secondary | ICD-10-CM | POA: Diagnosis present

## 2014-01-21 DIAGNOSIS — J810 Acute pulmonary edema: Secondary | ICD-10-CM

## 2014-01-21 DIAGNOSIS — Z992 Dependence on renal dialysis: Secondary | ICD-10-CM

## 2014-01-21 DIAGNOSIS — E8729 Other acidosis: Secondary | ICD-10-CM | POA: Diagnosis present

## 2014-01-21 DIAGNOSIS — N189 Chronic kidney disease, unspecified: Secondary | ICD-10-CM

## 2014-01-21 DIAGNOSIS — R0902 Hypoxemia: Secondary | ICD-10-CM

## 2014-01-21 DIAGNOSIS — J449 Chronic obstructive pulmonary disease, unspecified: Secondary | ICD-10-CM | POA: Diagnosis present

## 2014-01-21 DIAGNOSIS — I480 Paroxysmal atrial fibrillation: Secondary | ICD-10-CM

## 2014-01-21 DIAGNOSIS — Z91158 Patient's noncompliance with renal dialysis for other reason: Secondary | ICD-10-CM

## 2014-01-21 DIAGNOSIS — F1410 Cocaine abuse, uncomplicated: Secondary | ICD-10-CM | POA: Diagnosis present

## 2014-01-21 DIAGNOSIS — J811 Chronic pulmonary edema: Secondary | ICD-10-CM | POA: Diagnosis present

## 2014-01-21 DIAGNOSIS — G40909 Epilepsy, unspecified, not intractable, without status epilepticus: Secondary | ICD-10-CM | POA: Diagnosis present

## 2014-01-21 DIAGNOSIS — N186 End stage renal disease: Secondary | ICD-10-CM | POA: Diagnosis present

## 2014-01-21 DIAGNOSIS — Z9119 Patient's noncompliance with other medical treatment and regimen: Secondary | ICD-10-CM | POA: Diagnosis not present

## 2014-01-21 DIAGNOSIS — J4489 Other specified chronic obstructive pulmonary disease: Secondary | ICD-10-CM | POA: Diagnosis present

## 2014-01-21 DIAGNOSIS — I120 Hypertensive chronic kidney disease with stage 5 chronic kidney disease or end stage renal disease: Principal | ICD-10-CM | POA: Diagnosis present

## 2014-01-21 DIAGNOSIS — I776 Arteritis, unspecified: Secondary | ICD-10-CM | POA: Diagnosis present

## 2014-01-21 DIAGNOSIS — Z8249 Family history of ischemic heart disease and other diseases of the circulatory system: Secondary | ICD-10-CM | POA: Diagnosis not present

## 2014-01-21 DIAGNOSIS — E46 Unspecified protein-calorie malnutrition: Secondary | ICD-10-CM | POA: Diagnosis present

## 2014-01-21 DIAGNOSIS — R9431 Abnormal electrocardiogram [ECG] [EKG]: Secondary | ICD-10-CM | POA: Diagnosis present

## 2014-01-21 DIAGNOSIS — J9601 Acute respiratory failure with hypoxia: Secondary | ICD-10-CM

## 2014-01-21 DIAGNOSIS — F1910 Other psychoactive substance abuse, uncomplicated: Secondary | ICD-10-CM

## 2014-01-21 LAB — BASIC METABOLIC PANEL
Anion gap: 26 — ABNORMAL HIGH (ref 5–15)
BUN: 60 mg/dL — ABNORMAL HIGH (ref 6–23)
CO2: 21 meq/L (ref 19–32)
Calcium: 10.2 mg/dL (ref 8.4–10.5)
Chloride: 91 mEq/L — ABNORMAL LOW (ref 96–112)
Creatinine, Ser: 11.58 mg/dL — ABNORMAL HIGH (ref 0.50–1.35)
GFR calc Af Amer: 5 mL/min — ABNORMAL LOW (ref >90)
GFR, EST NON AFRICAN AMERICAN: 5 mL/min — AB (ref >90)
GLUCOSE: 97 mg/dL (ref 70–99)
POTASSIUM: 4.1 meq/L (ref 3.7–5.3)
SODIUM: 138 meq/L (ref 137–147)

## 2014-01-21 LAB — POCT I-STAT, CHEM 8
BUN: >140 mg/dL — ABNORMAL HIGH (ref 6–23)
Calcium, Ion: 1.08 mmol/L — ABNORMAL LOW (ref 1.12–1.32)
Calcium, Ion: 1.21 mmol/L (ref 1.12–1.32)
Chloride: 104 mEq/L (ref 96–112)
Chloride: 103 mEq/L (ref 96–112)
Glucose, Bld: 95 mg/dL (ref 70–99)
Glucose, Bld: 119 mg/dL — ABNORMAL HIGH (ref 70–99)
HCT: 24 % — ABNORMAL LOW (ref 39.0–52.0)
HCT: 20 % — ABNORMAL LOW (ref 39.0–52.0)
HEMOGLOBIN: 8.2 g/dL — AB (ref 13.0–17.0)
HEMOGLOBIN: 6.8 g/dL — AB (ref 13.0–17.0)
POTASSIUM: 5.6 meq/L — AB (ref 3.7–5.3)
Potassium: 7.1 mEq/L (ref 3.7–5.3)
Sodium: 130 mEq/L — ABNORMAL LOW (ref 137–147)
Sodium: 131 mEq/L — ABNORMAL LOW (ref 137–147)
TCO2: 16 mmol/L (ref 0–100)
TCO2: 18 mmol/L (ref 0–100)

## 2014-01-21 LAB — CBC WITH DIFFERENTIAL/PLATELET
BASOS ABS: 0 10*3/uL (ref 0.0–0.1)
BASOS PCT: 0 % (ref 0–1)
EOS PCT: 0 % (ref 0–5)
Eosinophils Absolute: 0 10*3/uL (ref 0.0–0.7)
HCT: 21.1 % — ABNORMAL LOW (ref 39.0–52.0)
HEMOGLOBIN: 7.2 g/dL — AB (ref 13.0–17.0)
Lymphocytes Relative: 18 % (ref 12–46)
Lymphs Abs: 0.9 10*3/uL (ref 0.7–4.0)
MCH: 32 pg (ref 26.0–34.0)
MCHC: 34.1 g/dL (ref 30.0–36.0)
MCV: 93.8 fL (ref 78.0–100.0)
MONO ABS: 0.3 10*3/uL (ref 0.1–1.0)
MONOS PCT: 7 % (ref 3–12)
NEUTROS ABS: 3.7 10*3/uL (ref 1.7–7.7)
Neutrophils Relative %: 75 % (ref 43–77)
Platelets: 120 10*3/uL — ABNORMAL LOW (ref 150–400)
RBC: 2.25 MIL/uL — ABNORMAL LOW (ref 4.22–5.81)
RDW: 15.6 % — AB (ref 11.5–15.5)
WBC: 5 10*3/uL (ref 4.0–10.5)

## 2014-01-21 LAB — I-STAT ARTERIAL BLOOD GAS, ED
Acid-base deficit: 7 mmol/L — ABNORMAL HIGH (ref 0.0–2.0)
BICARBONATE: 18.2 meq/L — AB (ref 20.0–24.0)
O2 Saturation: 100 %
PCO2 ART: 34.5 mmHg — AB (ref 35.0–45.0)
PO2 ART: 391 mmHg — AB (ref 80.0–100.0)
Patient temperature: 98.6
TCO2: 19 mmol/L (ref 0–100)
pH, Arterial: 7.332 — ABNORMAL LOW (ref 7.350–7.450)

## 2014-01-21 LAB — I-STAT VENOUS BLOOD GAS, ED
Acid-base deficit: 11 mmol/L — ABNORMAL HIGH (ref 0.0–2.0)
Bicarbonate: 15.4 mEq/L — ABNORMAL LOW (ref 20.0–24.0)
O2 Saturation: 82 %
PCO2 VEN: 35.9 mmHg — AB (ref 45.0–50.0)
PO2 VEN: 54 mmHg — AB (ref 30.0–45.0)
TCO2: 16 mmol/L (ref 0–100)
pH, Ven: 7.24 — ABNORMAL LOW (ref 7.250–7.300)

## 2014-01-21 LAB — COMPREHENSIVE METABOLIC PANEL
ALT: 13 U/L (ref 0–53)
ANION GAP: 31 — AB (ref 5–15)
AST: 15 U/L (ref 0–37)
Albumin: 4.1 g/dL (ref 3.5–5.2)
Alkaline Phosphatase: 70 U/L (ref 39–117)
BILIRUBIN TOTAL: 0.4 mg/dL (ref 0.3–1.2)
BUN: 167 mg/dL — AB (ref 6–23)
CHLORIDE: 88 meq/L — AB (ref 96–112)
CO2: 15 meq/L — AB (ref 19–32)
Calcium: 9.7 mg/dL (ref 8.4–10.5)
Creatinine, Ser: 26.52 mg/dL — ABNORMAL HIGH (ref 0.50–1.35)
GFR calc Af Amer: 2 mL/min — ABNORMAL LOW (ref >90)
GFR, EST NON AFRICAN AMERICAN: 2 mL/min — AB (ref >90)
Glucose, Bld: 98 mg/dL (ref 70–99)
Potassium: 7.6 mEq/L (ref 3.7–5.3)
Sodium: 134 mEq/L — ABNORMAL LOW (ref 137–147)
Total Protein: 7.4 g/dL (ref 6.0–8.3)

## 2014-01-21 LAB — LIPASE, BLOOD: LIPASE: 24 U/L (ref 11–59)

## 2014-01-21 LAB — GLUCOSE, CAPILLARY
GLUCOSE-CAPILLARY: 93 mg/dL (ref 70–99)
Glucose-Capillary: 94 mg/dL (ref 70–99)

## 2014-01-21 LAB — LACTIC ACID, PLASMA: Lactic Acid, Venous: 0.9 mmol/L (ref 0.5–2.2)

## 2014-01-21 LAB — TROPONIN I

## 2014-01-21 LAB — I-STAT TROPONIN, ED: Troponin i, poc: 0.04 ng/mL (ref 0.00–0.08)

## 2014-01-21 LAB — MRSA PCR SCREENING: MRSA BY PCR: NEGATIVE

## 2014-01-21 MED ORDER — NICOTINE 21 MG/24HR TD PT24
21.0000 mg | MEDICATED_PATCH | Freq: Every day | TRANSDERMAL | Status: DC
  Administered 2014-01-21 – 2014-01-23 (×3): 21 mg via TRANSDERMAL
  Filled 2014-01-21 (×5): qty 1

## 2014-01-21 MED ORDER — LIDOCAINE HCL (PF) 1 % IJ SOLN: 5.0000 mL | INTRAMUSCULAR | Status: DC | PRN

## 2014-01-21 MED ORDER — HYDRALAZINE HCL 20 MG/ML IJ SOLN
10.0000 mg | INTRAMUSCULAR | Status: DC | PRN
  Administered 2014-01-21: 10 mg via INTRAVENOUS
  Filled 2014-01-21: qty 1

## 2014-01-21 MED ORDER — PANTOPRAZOLE SODIUM 40 MG PO TBEC
40.0000 mg | DELAYED_RELEASE_TABLET | Freq: Every day | ORAL | Status: DC
  Administered 2014-01-21 – 2014-01-25 (×5): 40 mg via ORAL
  Filled 2014-01-21 (×5): qty 1

## 2014-01-21 MED ORDER — MORPHINE SULFATE 2 MG/ML IJ SOLN
INTRAMUSCULAR | Status: AC
  Filled 2014-01-21: qty 1

## 2014-01-21 MED ORDER — DIPHENHYDRAMINE HCL 25 MG PO TABS
25.0000 mg | ORAL_TABLET | Freq: Four times a day (QID) | ORAL | Status: DC | PRN
  Filled 2014-01-21: qty 1

## 2014-01-21 MED ORDER — NITROGLYCERIN 0.4 MG SL SUBL
SUBLINGUAL_TABLET | SUBLINGUAL | Status: AC
  Administered 2014-01-21: 0.4 mg
  Filled 2014-01-21: qty 1

## 2014-01-21 MED ORDER — LIDOCAINE-PRILOCAINE 2.5-2.5 % EX CREA: 1.0000 "application " | TOPICAL_CREAM | CUTANEOUS | Status: DC | PRN

## 2014-01-21 MED ORDER — INSULIN ASPART 100 UNIT/ML ~~LOC~~ SOLN
10.0000 [IU] | Freq: Once | SUBCUTANEOUS | Status: AC
  Administered 2014-01-21: 10 [IU] via INTRAVENOUS
  Filled 2014-01-21: qty 1

## 2014-01-21 MED ORDER — FENTANYL CITRATE 0.05 MG/ML IJ SOLN
25.0000 ug | INTRAMUSCULAR | Status: DC | PRN
  Administered 2014-01-21 – 2014-01-22 (×5): 50 ug via INTRAVENOUS
  Filled 2014-01-21 (×5): qty 2

## 2014-01-21 MED ORDER — HEPARIN SODIUM (PORCINE) 1000 UNIT/ML DIALYSIS
1000.0000 [IU] | INTRAMUSCULAR | Status: DC | PRN
  Filled 2014-01-21: qty 1

## 2014-01-21 MED ORDER — OXYCODONE HCL 5 MG PO TABS
ORAL_TABLET | ORAL | Status: AC
  Filled 2014-01-21: qty 1

## 2014-01-21 MED ORDER — HEPARIN SODIUM (PORCINE) 1000 UNIT/ML DIALYSIS: 1000.0000 [IU] | INTRAMUSCULAR | Status: DC | PRN

## 2014-01-21 MED ORDER — MORPHINE SULFATE 2 MG/ML IJ SOLN
1.0000 mg | INTRAMUSCULAR | Status: DC | PRN
  Administered 2014-01-21 (×3): 1 mg via INTRAVENOUS

## 2014-01-21 MED ORDER — SODIUM CHLORIDE 0.9 % IV SOLN: 100.0000 mL | INTRAVENOUS | Status: DC | PRN

## 2014-01-21 MED ORDER — HYDRALAZINE HCL 20 MG/ML IJ SOLN: 10.0000 mg | INTRAMUSCULAR | Status: DC | PRN

## 2014-01-21 MED ORDER — NEPRO/CARBSTEADY PO LIQD: 237.0000 mL | ORAL | Status: DC | PRN

## 2014-01-21 MED ORDER — OXYCODONE HCL 5 MG PO TABS
5.0000 mg | ORAL_TABLET | Freq: Once | ORAL | Status: AC
  Administered 2014-01-21: 5 mg via ORAL

## 2014-01-21 MED ORDER — ALTEPLASE 2 MG IJ SOLR: 2.0000 mg | Freq: Once | INTRAMUSCULAR | Status: DC | PRN

## 2014-01-21 MED ORDER — CARVEDILOL 12.5 MG PO TABS
12.5000 mg | ORAL_TABLET | Freq: Two times a day (BID) | ORAL | Status: DC
  Administered 2014-01-21: 12.5 mg via ORAL
  Filled 2014-01-21 (×4): qty 1

## 2014-01-21 MED ORDER — CALCIUM GLUCONATE 10 % IV SOLN
1.0000 g | Freq: Once | INTRAVENOUS | Status: AC
  Administered 2014-01-21: 1 g via INTRAVENOUS
  Filled 2014-01-21: qty 10

## 2014-01-21 MED ORDER — LOSARTAN POTASSIUM 50 MG PO TABS
100.0000 mg | ORAL_TABLET | Freq: Every day | ORAL | Status: DC
  Administered 2014-01-21 – 2014-01-24 (×4): 100 mg via ORAL
  Filled 2014-01-21 (×7): qty 2

## 2014-01-21 MED ORDER — FENTANYL CITRATE 0.05 MG/ML IJ SOLN
INTRAMUSCULAR | Status: AC
  Filled 2014-01-21: qty 2

## 2014-01-21 MED ORDER — PREDNISONE 10 MG PO TABS
10.0000 mg | ORAL_TABLET | Freq: Every day | ORAL | Status: DC
  Administered 2014-01-22 – 2014-01-23 (×2): 10 mg via ORAL
  Filled 2014-01-21 (×3): qty 1

## 2014-01-21 MED ORDER — LEVETIRACETAM 500 MG PO TABS
500.0000 mg | ORAL_TABLET | Freq: Two times a day (BID) | ORAL | Status: DC
  Administered 2014-01-21 – 2014-01-25 (×9): 500 mg via ORAL
  Filled 2014-01-21 (×10): qty 1

## 2014-01-21 MED ORDER — DEXTROSE 50 % IV SOLN
2.0000 | Freq: Once | INTRAVENOUS | Status: AC
  Administered 2014-01-21: 100 mL via INTRAVENOUS
  Filled 2014-01-21: qty 100

## 2014-01-21 MED ORDER — NITROGLYCERIN 0.4 MG SL SUBL: 0.4000 mg | SUBLINGUAL_TABLET | SUBLINGUAL | Status: DC | PRN

## 2014-01-21 MED ORDER — PENTAFLUOROPROP-TETRAFLUOROETH EX AERO: 1.0000 "application " | INHALATION_SPRAY | CUTANEOUS | Status: DC | PRN

## 2014-01-21 MED ORDER — HEPARIN SODIUM (PORCINE) 5000 UNIT/ML IJ SOLN
5000.0000 [IU] | Freq: Three times a day (TID) | INTRAMUSCULAR | Status: DC
  Administered 2014-01-21 – 2014-01-23 (×6): 5000 [IU] via SUBCUTANEOUS
  Filled 2014-01-21 (×6): qty 1

## 2014-01-21 MED ORDER — CLONIDINE HCL 0.2 MG PO TABS
0.2000 mg | ORAL_TABLET | Freq: Two times a day (BID) | ORAL | Status: DC
  Administered 2014-01-21 – 2014-01-25 (×9): 0.2 mg via ORAL
  Filled 2014-01-21 (×10): qty 1

## 2014-01-21 MED ORDER — ENSURE COMPLETE PO LIQD
237.0000 mL | Freq: Two times a day (BID) | ORAL | Status: DC
  Administered 2014-01-21: 237 mL via ORAL

## 2014-01-21 MED ORDER — ALBUTEROL SULFATE (2.5 MG/3ML) 0.083% IN NEBU
10.0000 mg | INHALATION_SOLUTION | Freq: Once | RESPIRATORY_TRACT | Status: AC
  Administered 2014-01-21: 10 mg via RESPIRATORY_TRACT
  Filled 2014-01-21: qty 12

## 2014-01-21 MED ORDER — NICARDIPINE HCL IN NACL 20-0.86 MG/200ML-% IV SOLN
3.0000 mg/h | INTRAVENOUS | Status: DC
  Administered 2014-01-21: 5 mg/h via INTRAVENOUS
  Administered 2014-01-21: 3 mg/h via INTRAVENOUS
  Administered 2014-01-21: 5 mg/h via INTRAVENOUS
  Administered 2014-01-22: 3 mg/h via INTRAVENOUS
  Filled 2014-01-21 (×6): qty 200

## 2014-01-21 MED ORDER — NEPRO/CARBSTEADY PO LIQD
237.0000 mL | ORAL | Status: DC | PRN
  Filled 2014-01-21: qty 237

## 2014-01-21 MED ORDER — ALTEPLASE 2 MG IJ SOLR
2.0000 mg | Freq: Once | INTRAMUSCULAR | Status: AC | PRN
  Filled 2014-01-21: qty 2

## 2014-01-21 MED ORDER — AMLODIPINE BESYLATE 10 MG PO TABS
10.0000 mg | ORAL_TABLET | Freq: Every day | ORAL | Status: DC
  Administered 2014-01-21 – 2014-01-24 (×4): 10 mg via ORAL
  Filled 2014-01-21 (×4): qty 1

## 2014-01-21 MED ORDER — CETYLPYRIDINIUM CHLORIDE 0.05 % MT LIQD
7.0000 mL | Freq: Two times a day (BID) | OROMUCOSAL | Status: DC
  Administered 2014-01-21 – 2014-01-25 (×7): 7 mL via OROMUCOSAL

## 2014-01-21 MED ORDER — DIPHENHYDRAMINE HCL 25 MG PO CAPS: 25.0000 mg | ORAL_CAPSULE | Freq: Four times a day (QID) | ORAL | Status: DC | PRN

## 2014-01-21 NOTE — ED Provider Notes (Signed)
CSN: [id number]     Arrival date & time 01/21/14  0455 History   First MD Initiated Contact with Patient 01/21/14 0501     Chief Complaint  Patient presents with  . Shortness of Breath     (Consider location/radiation/quality/duration/timing/severity/associated sxs/prior Treatment) HPI  LEBARON DAMBRA is a 48 y.o. male with past medical history of end-stage renal disease on hemodialysis Tuesday Thursday Saturday coming in with shortness of breath.  Per EMS he was 70% on room air upon their arrival. Patient has been noncompliant with dialysis his last session was 7 days ago. He arrives here on CPAP, he is tachypneic and diaphoretic. Oxygen saturation went up to 90%. Patient denied chest pain only states he is short of breath. He was planning to go to dialysis this morning but he could not make it. He denies any fevers coughing or recent infections. Patient states he does not make any urine. He denies any changes in his bowel movements. He states he's been compliant with his meds otherwise.  10 Systems reviewed and are negative for acute change except as noted in the HPI.    Past Medical History  Diagnosis Date  . COPD (chronic obstructive pulmonary disease)   . Crack cocaine use   . Dental caries   . HTN (hypertension) 06/12/2012  . Hematemesis/vomiting blood   . Shortness of breath     "just when I have too much potassium is too high" (05/15/2013)  . Anemia   . History of blood transfusion 10/2011; 04/2013  . Headache(784.0)     "get it from going to dialysis; when they get real bad I come to hospital" (05/15/2013)  . Arthritis     "qwhere" (05/15/2013)  . End stage renal disease 01/11/2012    Patient presented 11/04/11 to hospital with CP, wt loss and N/V. Creat was 10, admitted. 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 ANA was + 1:80, +MPO and +pr3 Ab's (ANCA). Received plasmapheresis x 7, IV cytoxan and pred taper. First HD was 11/17/11. 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 Pt d/c'd to do outpt HD and may have gotten 1-2 additional Cytoxan as OP, but this was stopped eventually since renal function didn't recover. Now gets HD TTS schedule at [facility].  L forearm AVF 11/19/11 by Dr. Scot Dock is current access.    Marland Kitchen ESRD (end stage renal disease) on dialysis     TTS: [address] [place] (05/15/2013)  . ESRD (end stage renal disease) on dialysis   . Headache(784.0)   . Depression   . Anxiety    Past Surgical History  Procedure Laterality Date  . Av fistula placement  11/29/2011    Procedure: ARTERIOVENOUS (AV) FISTULA CREATION;  Surgeon: Angelia Mould, MD;  Location: [place];  Service: Vascular;  Laterality: Left;  . Renal biopsy  11/07/2011  . Inguinal hernia repair Bilateral 1967  . Esophagogastroduodenoscopy N/A 05/17/2013    Procedure: ESOPHAGOGASTRODUODENOSCOPY (EGD);  Surgeon: Beryle Beams, MD;  Location: [facility] ENDOSCOPY;  Service: Endoscopy;  Laterality: N/A;   Family History  Problem Relation Age of Onset  . Heart attack Father    History  Substance Use Topics  . Smoking status: Current Every Day Smoker -- 1.00 packs/day for 35 years    Types: Cigars, Cigarettes  . Smokeless tobacco: Never Used  . Alcohol Use: No     Comment:  05/15/2013 "aien't drank in ~ 25 yrs"    Review of Systems    Allergies  Review of patient's allergies indicates no known allergies.  Home Medications   Prior to Admission medications   Medication Sig Start Date End Date Taking? Authorizing Provider  acetaminophen (TYLENOL) 500 MG tablet Take 1,000 mg by mouth every 6 (six) hours as needed. pain    Historical Provider, MD  amLODipine (NORVASC) 10 MG tablet Take 10 mg by mouth daily.    Historical Provider, MD  b complex vitamins tablet Take 1 tablet by mouth daily.    Historical Provider, MD  carvedilol (COREG) 25 MG tablet Take 1 tablet  (25 mg total) by mouth 2 (two) times daily with a meal. On Monday-Wednesday-Friday-Sunday (non-HD days); on dialysis days (Tuesday-Thursday-Saturday) take it once a day (evening time only). 07/19/13   Barton Dubois, MD  cloNIDine (CATAPRES) 0.2 MG tablet Take 1 tablet (0.2 mg total) by mouth 2 (two) times daily. 11/08/13   Reyne Dumas, MD  diphenhydrAMINE (BENADRYL) 25 MG tablet Take 25 mg by mouth every 6 (six) hours as needed for itching.    Historical Provider, MD  famotidine (PEPCID) 20 MG tablet Take 20 mg by mouth daily.    Historical Provider, MD  guaiFENesin-dextromethorphan (ROBITUSSIN DM) 100-10 MG/5ML syrup Take 5 mLs by mouth every 4 (four) hours as needed for cough. 11/08/13   Reyne Dumas, MD  hydrOXYzine (ATARAX/VISTARIL) 25 MG tablet Take 25 mg by mouth 3 (three) times daily. itching    Historical Provider, MD  levETIRAcetam (KEPPRA) 500 MG tablet Take 1 tablet (500 mg total) by mouth 2 (two) times daily. 09/19/13   Hulen Luster, DO  losartan (COZAAR) 100 MG tablet Take 100 mg by mouth at bedtime.    Historical Provider, MD  multivitamin (RENA-VIT) TABS tablet Take 1 tablet by mouth at bedtime. 11/29/11   Myriam Jacobson, PA-C  nicotine (NICODERM CQ - DOSED IN MG/24 HOURS) 21 mg/24hr patch Place 1 patch (21 mg total) onto the skin daily. 11/08/13   Reyne Dumas, MD  Nutritional Supplements (FEEDING SUPPLEMENT, NEPRO CARB STEADY,) LIQD Take 237 mLs by mouth 3 (three) times daily as needed (Supplement). 05/17/13   Maryann Mikhail, DO  pantoprazole (PROTONIX) 40 MG tablet Take 40 mg by mouth daily.    Historical Provider, MD  predniSONE (DELTASONE) 10 MG tablet Take 10 mg by mouth daily with breakfast.    Historical Provider, MD  sevelamer carbonate (RENVELA) 800 MG tablet Take 800 mg by mouth 2 (two) times daily.     Historical Provider, MD  VELPHORO 500 MG chewable tablet Chew 500 mg by mouth 2 (two) times daily.  09/11/13   Historical Provider, MD   BP 188/123  Pulse 102  Temp(Src)  97.9 F (36.6 C) (Axillary)  Resp 22  Ht 6\' 5"  (1.956 m)  Wt 157 lb 10.1 oz (71.501 kg)  BMI 18.69 kg/m2  SpO2 100% Physical Exam  Nursing note and vitals reviewed. Constitutional: He is oriented to person, place, and time. Vital signs are normal. He appears well-developed and well-nourished.  Non-toxic appearance. He does not appear ill. He appears distressed.  HENT:  Head: Normocephalic and atraumatic.  Nose: Nose normal.  Mouth/Throat: Oropharynx is clear and moist. No oropharyngeal exudate.  Eyes: Conjunctivae and EOM are normal. Pupils are equal, round, and reactive to light. No scleral icterus.  Neck: Normal range of motion. Neck supple. No tracheal deviation, no edema, no erythema and normal range of motion  present. No mass and no thyromegaly present.  Cardiovascular: Regular rhythm, S1 normal, S2 normal, normal heart sounds, intact distal pulses and normal pulses.  Exam reveals no gallop and no friction rub.   No murmur heard. Pulses:      Radial pulses are 2+ on the right side, and 2+ on the left side.       Dorsalis pedis pulses are 2+ on the right side, and 2+ on the left side.  Tachycardia  Pulmonary/Chest: Breath sounds normal. He is in respiratory distress. He has no wheezes. He has no rhonchi. He has no rales.  Tachypnea, increased work of breathing, use of accessory muscles.  Abdominal: Soft. Normal appearance and bowel sounds are normal. He exhibits no distension, no ascites and no mass. There is no hepatosplenomegaly. There is no tenderness. There is no rebound, no guarding and no CVA tenderness.  Musculoskeletal: Normal range of motion. He exhibits no edema and no tenderness.  Left radial AV fistula with palpable thrill.  Lymphadenopathy:    He has no cervical adenopathy.  Neurological: He is alert and oriented to person, place, and time. He has normal strength. No sensory deficit. GCS eye subscore is 4. GCS verbal subscore is 5. GCS motor subscore is 6.  Skin: Skin  is warm and intact. No petechiae and no rash noted. He is diaphoretic. No erythema. No pallor.  Psychiatric: He has a normal mood and affect. His behavior is normal. Judgment normal.    ED Course  Procedures (including critical care time) Labs Review Labs Reviewed  CBC WITH DIFFERENTIAL - Abnormal; Notable for the following:    RBC 2.25 (*)    Hemoglobin 7.2 (*)    HCT 21.1 (*)    RDW 15.6 (*)    Platelets 120 (*)    All other components within normal limits  COMPREHENSIVE METABOLIC PANEL - Abnormal; Notable for the following:    Sodium 134 (*)    Potassium 7.6 (*)    Chloride 88 (*)    CO2 15 (*)    BUN 167 (*)    Creatinine, Ser 26.52 (*)    GFR calc non Af Amer 2 (*)    GFR calc Af Amer 2 (*)    Anion gap 31 (*)    All other components within normal limits  I-STAT VENOUS BLOOD GAS, ED - Abnormal; Notable for the following:    pH, Ven 7.240 (*)    pCO2, Ven 35.9 (*)    pO2, Ven 54.0 (*)    Bicarbonate 15.4 (*)    Acid-base deficit 11.0 (*)    All other components within normal limits  LIPASE, BLOOD  LACTIC ACID, PLASMA  BLOOD GAS, VENOUS  BLOOD GAS, ARTERIAL  CBG MONITORING, ED  I-STAT CHEM 8, ED  I-STAT TROPOININ, ED  I-STAT CHEM 8, ED    Imaging Review Dg Chest Port 1 View  01/21/2014   CLINICAL DATA:  Shortness of breath  EXAM: PORTABLE CHEST - 1 VIEW  COMPARISON:  Prior radiograph from 11/04/2013  FINDINGS: Cardiomegaly is stable from prior study. Mediastinal silhouette within normal limits.  Lungs are normally inflated. Extensive vascular congestion with indistinctness of the interstitial markings is most consistent with diffuse pulmonary edema. More confluent opacities are present within the right upper and lower lobes, which may reflect superimposed infection. No pneumothorax. Probable tiny bilateral pleural effusions present.  No acute osseous abnormality.  IMPRESSION: 1. Cardiomegaly with findings suggestive of moderate diffuse pulmonary edema. More confluent  opacities within  the right upper and lower lobes may reflect superimposed infection in the correct clinical setting. 2. Probable tiny bilateral pleural effusions.   Electronically Signed   By: Jeannine Boga M.D.   On: 01/21/2014 05:17     EKG Interpretation   Date/Time:  Tuesday January 21 2014 05:56:42 EDT Ventricular Rate:  100 PR Interval:  186 QRS Duration: 104 QT Interval:  356 QTC Calculation: 459 R Axis:   51 Text Interpretation:  Sinus tachycardia peaked t waves, concerning for  hyperkalemia Confirmed by Glynn Octave [phone]) on 01/21/2014  6:10:15 AM      MDM   Final diagnoses:  None    Patient presents emergency department complaining of shortness of breath. He was immediately transitioned to BiPAP. During the transition his oxygen saturation did fall to the low 80s. This immediately went back up to 99% on BiPAP 12/5.  After 30 minutes during my reassessment the patient had an improved from a respiratory standpoint. Still has some underlying tachypnea, however he is not using his accessory muscles and there is no increased work of breathing. Rhythm strip from EMS shows peaked T waves.  EKG here shows the same, he was immediately given 2 g of calcium. Patient was also ordered for an albuterol neb, insulin, and D50. Nephrology has been counseled and will evaluate the patient emergency department for emergent dialysis.  Patient retained in the hospital for continued care by the hospitalist service.     CRITICAL CARE Performed by: Everlene Balls   Total critical care time: 40 minutes  Critical care time was exclusive of separately billable procedures and treating other patients.  Critical care was necessary to treat or prevent imminent or life-threatening deterioration.  Critical care was time spent personally by me on the following activities: development of treatment plan with patient and/or surrogate as well as nursing, discussions with consultants,  evaluation of patient's response to treatment, examination of patient, obtaining history from patient or surrogate, ordering and performing treatments and interventions, ordering and review of laboratory studies, ordering and review of radiographic studies, pulse oximetry and re-evaluation of patient's condition.   Everlene Balls, MD 01/21/14 [phone]

## 2014-01-21 NOTE — ED Notes (Signed)
Pt has missed his last 3 dialysis treatments (Tuesday, Thursday, Saturday). EMS states that he has a hx of non-compliance with dialysis. Pt arrived on c-pap. EMS was only able to get the pt's O2 sats to 90% on C-pap. Hx of hypertension and renal failure. Fistula in right lower arm.

## 2014-01-21 NOTE — Plan of Care (Signed)
Problem: Phase I Progression Outcomes Goal: Pain controlled with appropriate interventions Outcome: Progressing Headache

## 2014-01-21 NOTE — ED Notes (Signed)
Called pharmacy again in regards to calcium.  Instead of waiting, may push medication. MD aware of K+ level 7.1.

## 2014-01-21 NOTE — Consult Note (Addendum)
Renal Service Consult Note [place] 01/21/2014 Jeremy Mccoy Requesting Physician:  Dr Jeremy Mccoy  Reason for Consult:  Reps distress, now HD for one week HPI: The patient is a 48 y.o. year-old with hx of ESRD ,HTN, cocaine abuse presented to ED with resp distress. SOB for about 12 hours, cough, no fever. Pulm edema, hyperK.  Missed HD x 3. No CP.    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, prob levamisole vasculitis, COPD, tobacco, cocaine abuse, anemia, HTN , HPTH 12/13 - hyperkalemia, acute resp failure, polysubstance abuse, ESRD 01/14 - hyperkalemia, resp failure, vol excessk, ESRD, cocaine abuse 05/14 - vol overload, missed HD, tobacco, hyperkalemia, ESRD on HD 12/14 - hyperkalemia, esophagitis by EGD, HD 02/15 - PRES w status epilepticus rx with intubation and antiepileptics, was dc'd on two seizure medications, ESRD on HD  06/15 - resp distress and hypoxemia, missed HD, ESRD , treated with HD   ROS  no jt pain, no fever  no n/v/d or abd pain  chronic HA's that are worse on dialysis  Past Medical History  Past Medical History  Diagnosis Date  . COPD (chronic obstructive pulmonary disease)   . Crack cocaine use   . Dental caries   . HTN (hypertension) 06/12/2012  . Hematemesis/vomiting blood   . Shortness of breath     "just when I have too much potassium is too high" (05/15/2013)  . Anemia   . History of blood transfusion 10/2011; 04/2013  . Headache(784.0)     "get it from going to dialysis; when they get real bad I come to hospital" (05/15/2013)  . Arthritis     "qwhere" (05/15/2013)  . End stage renal disease 01/11/2012    Patient presented 11/04/11 to hospital with CP, wt loss and N/V. Creat was 10, admitted. 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 ANA was + 1:80, +MPO and +pr3 Ab's (ANCA). Received plasmapheresis x 7, IV cytoxan and pred taper. First HD was 11/17/11. 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 Pt d/c'd to do outpt HD and may have gotten 1-2 additional Cytoxan as OP, but this was stopped eventually since renal function didn't recover. Now gets HD TTS schedule at [facility].  L forearm AVF 11/19/11 by Dr. Scot Mccoy is current access.    Marland Kitchen ESRD (end stage renal disease) on dialysis     TTS: [address] [place] (05/15/2013)  . ESRD (end stage renal disease) on dialysis   . Headache(784.0)   . Depression   . Anxiety    Past Surgical History  Past Surgical History  Procedure Laterality Date  . Av fistula placement  11/29/2011    Procedure: ARTERIOVENOUS (AV) FISTULA CREATION;  Surgeon: Jeremy Mould, MD;  Location: [place];  Service: Vascular;  Laterality: Left;  . Renal biopsy  11/07/2011  . Inguinal hernia repair Bilateral 1967  . Esophagogastroduodenoscopy N/A 05/17/2013    Procedure: ESOPHAGOGASTRODUODENOSCOPY (EGD);  Surgeon: Jeremy Beams, MD;  Location: [facility] ENDOSCOPY;  Service: Endoscopy;  Laterality: N/A;   Family History  Family History  Problem Relation Age of Onset  . Heart attack Father    Social History  reports that he has been smoking Cigars and Cigarettes.  He has a 35 pack-year smoking history. He has never used smokeless tobacco. He reports that he uses illicit drugs (Marijuana and Cocaine).  He reports that he does not drink alcohol. Allergies No Known Allergies Home medications Prior to Admission medications   Medication Sig Start Date End Date Taking? Authorizing Provider  acetaminophen (TYLENOL) 500 MG tablet Take 1,000 mg by mouth every 6 (six) hours as needed. pain    Historical Provider, MD  amLODipine (NORVASC) 10 MG tablet Take 10 mg by mouth daily.    Historical Provider, MD  b complex vitamins tablet Take 1 tablet by mouth daily.    Historical Provider, MD  carvedilol (COREG) 25 MG tablet Take 1 tablet (25 mg total) by mouth 2  (two) times daily with a meal. On Monday-Wednesday-Friday-Sunday (non-HD days); on dialysis days (Tuesday-Thursday-Saturday) take it once a day (evening time only). 07/19/13   Jeremy Dubois, MD  cloNIDine (CATAPRES) 0.2 MG tablet Take 1 tablet (0.2 mg total) by mouth 2 (two) times daily. 11/08/13   Jeremy Dumas, MD  diphenhydrAMINE (BENADRYL) 25 MG tablet Take 25 mg by mouth every 6 (six) hours as needed for itching.    Historical Provider, MD  famotidine (PEPCID) 20 MG tablet Take 20 mg by mouth daily.    Historical Provider, MD  guaiFENesin-dextromethorphan (ROBITUSSIN DM) 100-10 MG/5ML syrup Take 5 mLs by mouth every 4 (four) hours as needed for cough. 11/08/13   Jeremy Dumas, MD  hydrOXYzine (ATARAX/VISTARIL) 25 MG tablet Take 25 mg by mouth 3 (three) times daily. itching    Historical Provider, MD  levETIRAcetam (KEPPRA) 500 MG tablet Take 1 tablet (500 mg total) by mouth 2 (two) times daily. 09/19/13   Jeremy Luster, DO  losartan (COZAAR) 100 MG tablet Take 100 mg by mouth at bedtime.    Historical Provider, MD  multivitamin (RENA-VIT) TABS tablet Take 1 tablet by mouth at bedtime. 11/29/11   Jeremy Jacobson, PA-C  nicotine (NICODERM CQ - DOSED IN MG/24 HOURS) 21 mg/24hr patch Place 1 patch (21 mg total) onto the skin daily. 11/08/13   Jeremy Dumas, MD  Nutritional Supplements (FEEDING SUPPLEMENT, NEPRO CARB STEADY,) LIQD Take 237 mLs by mouth 3 (three) times daily as needed (Supplement). 05/17/13   Jeremy Mikhail, DO  pantoprazole (PROTONIX) 40 MG tablet Take 40 mg by mouth daily.    Historical Provider, MD  predniSONE (DELTASONE) 10 MG tablet Take 10 mg by mouth daily with breakfast.    Historical Provider, MD  sevelamer carbonate (RENVELA) 800 MG tablet Take 800 mg by mouth 2 (two) times daily.     Historical Provider, MD  VELPHORO 500 MG chewable tablet Chew 500 mg by mouth 2 (two) times daily.  09/11/13   Historical Provider, MD   Liver Function Tests  Recent Labs Lab 01/21/14 0523  AST  15  ALT 13  ALKPHOS 70  BILITOT 0.4  PROT 7.4  ALBUMIN 4.1    Recent Labs Lab 01/21/14 0523  LIPASE 24   CBC  Recent Labs Lab 01/21/14 0523  WBC 5.0  NEUTROABS 3.7  HGB 7.2*  HCT 21.1*  MCV 93.8  PLT 123456*   Basic Metabolic Panel  Recent Labs Lab 01/21/14 0523  NA 134*  K 7.6*  CL 88*  CO2 15*  GLUCOSE 98  BUN 167*  CREATININE 26.52*  CALCIUM 9.7    Filed Vitals:   01/21/14 0645 01/21/14 0700 01/21/14 0715 01/21/14 0730  BP: 194/125 194/132 181/111 202/123  Pulse: 101 108 96 107  Temp:      TempSrc:      Resp: 24 31 22 21   Height:  Weight:      SpO2: 100% 100% 100% 100%   Exam Alert, on BIPAP No rash, cyanosis or gangrene Sclera anicteric, throat clear +JVD Chest clear bilat on Bipap RRR no MRG Abd soft, NTND, no mass or ascites No LE edema Neuro is ox 3, nf L forearm AVF patent   HD: TTS [place] 4h 79min  450/800   1K/2.5 [place]  71.5kg   LFA AVF  Heparin none Hectorol 3 TIW, Aranesp 120 ug q Tues  Assessment: 1 Resp distress  2 Pulm edema 3 HTN crisis 4 Uremia / missed HD / ESRD 5 Hx cocaine abuse 6 Tobacco use 7 Hyperkalemia 8 Nonadherence  9 Seizure d/o on Keppra, stable 10 Recurrent headaches- occurs on HD about 1 hour into Rx reliably   Plan- acute HD today, possibly again tomorrow if needed  Kelly Splinter MD (pgr) [phone]    ([phone] 01/21/2014, 7:41 AM

## 2014-01-21 NOTE — ED Notes (Signed)
Phoned dialysis that pt transport delayed due to internal medicine at bedside.

## 2014-01-21 NOTE — Progress Notes (Signed)
Date: 01/21/2014               Patient Name:  Jeremy Mccoy MRN: [id number]  DOB: 06-Apr-1966 Age / Sex: 48 y.o., male   PCP: Leamon Arnt, MD              Medical Service: Internal Medicine Teaching Service              Attending Physician: Dr. Everlene Balls, MD    First Contact: Charise Killian MS IV Pager: [phone]  Second Contact: Dr. Aundra Dubin  Pager: [phone]            After Hours (After 5p/  First Contact Pager: [phone]  weekends / holidays): Second Contact Pager: [phone]   Chief Complaint: Shortness of breath  History of Present Illness: This is a 48 year old gentleman, with past medical history of end-stage renal disease on hemodialysis TTS since July 2015, hypertension, cocaine and tobacco abuse, and medication non-compliance. He presents with worsening shortness of breath after missing dialysis for one week. He was attempting to go for his dialysis today, but he was unable and he was routed to the ED by EMS due to severe symptoms. He denies acute chest pain, fevers, chills, nausea or vomiting. His appetite has been good. He denies cough. His blood pressure was found to be severely elevated in the emergency department, and his O2 saturations, were in the 70s. Oxygen saturation improved with supplemental oxygen via BiPAP. Patient continues to report increasing shortness of breath and is providing history in breaking sentences.  Review of Systems: Constitutional: Denies fever, chills, diaphoresis, appetite change and fatigue.  HEENT: Denies photophobia, eye pain, redness, hearing loss, ear pain, congestion, sore throat, rhinorrhea, sneezing, mouth sores, trouble swallowing, neck pain, neck stiffness and tinnitus.  Respiratory: Denies chest tightness, and wheezing.  Cardiovascular: Denies chest pain, palpitations and leg swelling.  Gastrointestinal: Denies nausea, vomiting, abdominal pain, diarrhea, constipation,blood in stool and abdominal distention.  Genitourinary: Does not make  any urine Musculoskeletal: Denies myalgias, back pain, joint swelling, arthralgias and gait problem.  Skin: Denies pallor, rash and wound.  Neurological: Denies dizziness, seizures, syncope, weakness, lightheadedness, numbness and headaches.  Hematological: Denies adenopathy. Reports bruising at the bilateral his legs, no personal or family bleeding history  Psychiatric/Behavioral: Denies suicidal ideation, mood changes, confusion, nervousness, sleep disturbance and agitation  Meds: No current facility-administered medications for this encounter.   Current Outpatient Prescriptions  Medication Sig Dispense Refill  . acetaminophen (TYLENOL) 500 MG tablet Take 1,000 mg by mouth every 6 (six) hours as needed. pain      . amLODipine (NORVASC) 10 MG tablet Take 10 mg by mouth daily.      Marland Kitchen b complex vitamins tablet Take 1 tablet by mouth daily.      . carvedilol (COREG) 25 MG tablet Take 1 tablet (25 mg total) by mouth 2 (two) times daily with a meal. On Monday-Wednesday-Friday-Sunday (non-HD days); on dialysis days (Tuesday-Thursday-Saturday) take it once a day (evening time only).  60 tablet  1  . cloNIDine (CATAPRES) 0.2 MG tablet Take 1 tablet (0.2 mg total) by mouth 2 (two) times daily.  60 tablet  2  . diphenhydrAMINE (BENADRYL) 25 MG tablet Take 25 mg by mouth every 6 (six) hours as needed for itching.      . famotidine (PEPCID) 20 MG tablet Take 20 mg by mouth daily.      Marland Kitchen guaiFENesin-dextromethorphan (ROBITUSSIN DM) 100-10 MG/5ML syrup Take 5 mLs by mouth  every 4 (four) hours as needed for cough.  118 mL  0  . hydrOXYzine (ATARAX/VISTARIL) 25 MG tablet Take 25 mg by mouth 3 (three) times daily. itching      . levETIRAcetam (KEPPRA) 500 MG tablet Take 1 tablet (500 mg total) by mouth 2 (two) times daily.  60 tablet  3  . losartan (COZAAR) 100 MG tablet Take 100 mg by mouth at bedtime.      . multivitamin (RENA-VIT) TABS tablet Take 1 tablet by mouth at bedtime.      . nicotine (NICODERM CQ  - DOSED IN MG/24 HOURS) 21 mg/24hr patch Place 1 patch (21 mg total) onto the skin daily.  28 patch  0  . Nutritional Supplements (FEEDING SUPPLEMENT, NEPRO CARB STEADY,) LIQD Take 237 mLs by mouth 3 (three) times daily as needed (Supplement).    0  . pantoprazole (PROTONIX) 40 MG tablet Take 40 mg by mouth daily.      . predniSONE (DELTASONE) 10 MG tablet Take 10 mg by mouth daily with breakfast.      . sevelamer carbonate (RENVELA) 800 MG tablet Take 800 mg by mouth 2 (two) times daily.       . VELPHORO 500 MG chewable tablet Chew 500 mg by mouth 2 (two) times daily.         Allergies: Allergies as of 01/21/2014  . (No Known Allergies)   Past Medical History  Diagnosis Date  . COPD (chronic obstructive pulmonary disease)   . Crack cocaine use   . Dental caries   . HTN (hypertension) 06/12/2012  . Hematemesis/vomiting blood   . Shortness of breath     "just when I have too much potassium is too high" (05/15/2013)  . Anemia   . History of blood transfusion 10/2011; 04/2013  . Headache(784.0)     "get it from going to dialysis; when they get real bad I come to hospital" (05/15/2013)  . Arthritis     "qwhere" (05/15/2013)  . End stage renal disease 01/11/2012    Patient presented 11/04/11 to hospital with CP, wt loss and N/V. Creat was 10, admitted. 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 ANA was + 1:80, +MPO and +pr3 Ab's (ANCA). Received plasmapheresis x 7, IV cytoxan and pred taper. First HD was 11/17/11. 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 Pt d/c'd to do outpt HD and may have gotten 1-2 additional Cytoxan as OP, but this was stopped eventually since renal function didn't recover. Now gets HD TTS schedule at [facility].  L forearm AVF 11/19/11 by Dr. Scot Dock is current access.    Marland Kitchen ESRD (end stage renal disease) on dialysis     TTS: [address] [place]  (05/15/2013)  . ESRD (end stage renal disease) on dialysis   . Headache(784.0)   . Depression   . Anxiety    Past Surgical History  Procedure Laterality Date  . Av fistula placement  11/29/2011    Procedure: ARTERIOVENOUS (AV) FISTULA CREATION;  Surgeon: Angelia Mould, MD;  Location: [facility];  Service: Vascular;  Laterality: Left;  . Renal biopsy  11/07/2011  . Inguinal hernia repair Bilateral 1967  . Esophagogastroduodenoscopy N/A 05/17/2013    Procedure: ESOPHAGOGASTRODUODENOSCOPY (EGD);  Surgeon: Beryle Beams, MD;  Location[facility];  Service: Endoscopy;  Laterality: N/A;   Family History  Problem Relation Age of Onset  .  Heart attack Father    History   Social History  . Marital Status: Single    Spouse Name: N/A    Number of Children: 0  . Years of Education: 9 th   Occupational History  . floor insulation     Disabled   Social History Main Topics  . Smoking status: Current Every Day Smoker -- 1.00 packs/day for 35 years    Types: Cigars, Cigarettes  . Smokeless tobacco: Never Used  . Alcohol Use: No     Comment: 05/15/2013 "aien't drank in ~ 25 yrs"  . Drug Use: Yes    Special: Marijuana, Cocaine     Comment: Precription drugs (e.g. percocet) "I take what I have to to controll my constant pain" (05/15/2013)  . Sexual Activity: No   Other Topics Concern  . Not on file   Social History Narrative   ** Merged History Encounter **   Patient lives at home with his parents and he is disabled.   Education 9th grade education   Right handed   Caffeine one cup daily          Physical Exam: Blood pressure 196/120, pulse 108, temperature 97.9 F (36.6 C), temperature source Axillary, resp. rate 18, height 6\' 5"  (1.956 m), weight 157 lb 10.1 oz (71.501 kg), SpO2 100.00%.  General: well developed, well nourished; moderate respiratory distress cooperative with exam Head: atraumatic, normocephalic,  Eye: pupils equal, round and reactive; sclera anicteric;  normal conjunctiva  Nose/throat: oropharynx clear, moist mucous membranes, slightly pale gums  Neck: JVD elevated at 3-4 cm Lungs/Chest wall: clear to auscultation bilaterally, increased work of breathing  Heart: normal rate and regular rhythm; notable for 3/6 systolic murmur in the mitral area. This might be transmitted from his AV fistula Pulses: radial and dorsalis pedis pulses are 2+ and symmetric  Abdomen: Normal fullness, no rebound, guarding, or rigidity; normal bowel sounds; no masses or organomegaly  Skin: warm, dry, intact, normal turgor, no rashes  Extremities: no peripheral edema, clubbing, or cyanosis. 2 areas of ecchymosis on the medial aspect of both his legs Neurologic: A&O X3, CN II - XII are grossly intact. Motor strength is 5/5 in the all 4 extremities Psych: patient is alert and oriented, mood and affect are normal and congruent, thought content is normal without delusions, thought process is linear, speech is normal and non-pressured, behavior is normal  Lab results: Basic Metabolic Panel:  Recent Labs  01/21/14 0523  NA 134*  K 7.6*  CL 88*  CO2 15*  GLUCOSE 98  BUN 167*  CREATININE 26.52*  CALCIUM 9.7   Liver Function Tests:  Recent Labs  01/21/14 0523  AST 15  ALT 13  ALKPHOS 70  BILITOT 0.4  PROT 7.4  ALBUMIN 4.1    Recent Labs  01/21/14 0523  LIPASE 24   No results found for this basename: AMMONIA,  in the last 72 hours CBC:  Recent Labs  01/21/14 0523  WBC 5.0  NEUTROABS 3.7  HGB 7.2*  HCT 21.1*  MCV 93.8  PLT 120*   Urine Drug Screen: Drugs of Abuse     Component Value Date/Time   LABOPIA NEGATIVE 11/05/2011 1441   COCAINSCRNUR POSITIVE* 11/05/2011 1441   LABBENZ NEGATIVE 11/05/2011 1441   AMPHETMU NEGATIVE 11/05/2011 1441     Other results: EKG: normal sinus rhythm, sinus tachycardia, tall tented T waves.  Assessment & Plan by Problem: Principal Problem:   ESRD on hemodialysis Active Problems:   Tobacco abuse  Cocaine abuse   Pulmonary edema   Anemia   Acute respiratory failure   Hyperkalemia   Malignant hypertension   High anion gap metabolic acidosis   Acute Hypoxic respiratory failure: He was hypoxic on presentation with oxygen saturation of 70s on RA. He required BiPAP with subsequent improvement in respiratory status. Currently receiving 3 L via nasal cannula and oxygenating above 90%. His lung exam has been unremarkable, without wheezing. Likely his hypoxia is due to pulmonary edema in the setting of ESRD. His portable chest x-ray revealed cardiomegaly with moderate pulmonary edema. There was a question of opacities in the right upper and lower large which raised a concern of superimposed infection. However, patient denies fevers, chills, or any other symptoms of pneumonia. Given his suppressive immunotherapy with steroid use and hemodialysis, definitely, a possibility of hospital-acquired pneumonia remains. He will require repeat 2 view chest x-ray after hemodialysis for further of his infiltrates. If these remain, antibiotic therapy can be considered but will hold off for now. He has 1 point (Tachycardia) on Well's score for pulmonary embolism, which makes it less likely, especially giving a confluent of other clinical explanations of his tachycardia. Patient has a questionable history of COPD, but has no documented pulmonary function tests. Does not take bronchodilators at home. Plan. - admit to tele - Respiratory status likely to improve with dialysis. - Supplementally oxygen to keep O2 88-92 - 2 view x-ray after volume removal with HD - Will monitor clinically for any concern of pneumonia. - May be benefit from bronchodilators on an as needed basis  ESRD on HD: In the setting of hemodialysis noncompliance. 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 On TTS schedule at [facility] since 2013. Last dialysis was last week. His presentation with acute hypoxic respiratory  failure, malignant hypertension, and hyperkalemia are related to noncompliance with dialysis. Plan. -Nephrology service has been consulted and patient will get dialysis today and tomorrow. -Renal diet with fluid restriction of not more than 1200 cc daily -Continue with prednisone 10 mg daily  Anion gap metabolic acidosis: In the setting of chronic renal failure. BUN of 167. CO2 of 15. Arterial blood gas with pH of 7.33, PCO2 of 34.5, and oxygen saturation 100%. Likely he is anion gap metabolic acidosis is due to uremia. He denies toxin ingestion of ethylene glycol, ethanol, or methanol. Plan -Monitor with BMP   Hyperkalemia: In the setting of HD noncompliance. Potassium level of 7.6 on presentation with electrocardiographic changes. 2 months ago, his potassium was 5.6. He received treatment in the ED with calcium gluconate, neb albuterol, insulin, with dextrose 50% solution. Plan - Will repeat a BMP to check potassium level - Will repeat EKG, as well - If his potassium level remains high, we'll may repeat calcium gluconate, insulin, and dextrose in addition to nebulized albuterol - Avoid high potassium foods - Keep patient on telemetry  Malignant hypertension: Blood pressure on presentation was 207/131. The patient reports poor compliance with his home regimen including amlodipine, 10 mg daily, clonidine 0.2 mg daily, carvedilol 25 mg twice daily, and losartan 100 mg daily.  Plan. - Blood pressure likely to improve with hemodialysis  - Continue with amlodipine 10 mg daily. - Continue with clonidine 0.2 mg twice a day. - Continue with albuterol 25 mg 2 times a day - Continue with losartan 100 mg daily   Chronic normocytic anemia: Likely related to chronic kidney disease. Baseline hemoglobin of between 7-8 per presentation today with hemoglobin of 7.2. No  signs of active bleeding. Patient says, that they're recently had a normal FOBT testing. Plan. -Monitor closely -May need transfusion  if hemoglobin drops less than 7   Seizure disorder: in 06/2013 >>PRES w status epilepticus rx with intubation and antiepileptics, was dc'd on two seizure medications. He takes Keppra 500 mg twice daily. Has been seizure-free since that admission in February 2015. Follows up with outpatient neurology. Plan. -Continue with Keppra 500 twice a day -Control blood pressure aggressively with hemodialysis, and medications   Cocaine and tobacco abuse:  Endorses recent cocaine use 4 days ago. Has been in outpatient rehabilitation several times without success. Will consult social work   Dispo: Disposition is deferred at this time, awaiting improvement of current medical problems. Anticipated discharge in approximately 2 day(s).   The patient does have a current PCP Leamon Arnt, MD), therefore is not require OPC follow-up after discharge.   The patient does not have transportation limitations that hinder transportation to clinic appointments.   Signed:  Jessee Avers, MD PGY-3 Internal Medicine Teaching Service Pager: [phone] (7pm-7am) 01/21/2014, 8:01 AM

## 2014-01-21 NOTE — H&P (Signed)
Date: 01/21/2014               Patient Name:  Jeremy Mccoy MRN: [id number]  DOB: 25-Feb-1966 Age / Sex: 49 y.o., male   PCP: Leamon Arnt, MD              Medical Service: Internal Medicine Teaching Service              Attending Physician: Dr. Everlene Balls, MD     Emergency contact: [phone] (parents) Code Status: Full  Chief Complaint: Shortness of Breath  History of Present Illness: Mr. Witteman is a 48 yo man with a PMH of ESRD on HD, HTN, anemia of CKD, seizure disorder, and a long history of cocaine abuse (last time smoking cocaine 4 days ago).  He is normally scheduled for TID HD through L arm fistula, but missed 3 dialysis sessions this week due to "being stupid."  He called EMS this morning due to severe dyspnea.  When EMS arrived, he was satting at 70% on room air.  In the ED, oxygen sat improved to 100% on BiPAP.  He endorses orthopnea and the need to cough, although states that he is unable to do so.  He is anuric at baseline and says that he has experienced a few chills and over the last week.  He also reports a feeling of mild bloating.  He denies LOC, vision changes, fevers, chest pain diarrhea, melena, hematochezia, edema, and weight loss.  Meds: No current facility-administered medications for this encounter.   Current Outpatient Prescriptions  Medication Sig Dispense Refill  . acetaminophen (TYLENOL) 500 MG tablet Take 1,000 mg by mouth every 6 (six) hours as needed. pain      . amLODipine (NORVASC) 10 MG tablet Take 10 mg by mouth daily.      Marland Kitchen b complex vitamins tablet Take 1 tablet by mouth daily.      . carvedilol (COREG) 25 MG tablet Take 1 tablet (25 mg total) by mouth 2 (two) times daily with a meal. On Monday-Wednesday-Friday-Sunday (non-HD days); on dialysis days (Tuesday-Thursday-Saturday) take it once a day (evening time only).  60 tablet  1  . cloNIDine (CATAPRES) 0.2 MG tablet Take 1 tablet (0.2 mg total) by mouth 2 (two) times daily.  60 tablet  2    . diphenhydrAMINE (BENADRYL) 25 MG tablet Take 25 mg by mouth every 6 (six) hours as needed for itching.      . famotidine (PEPCID) 20 MG tablet Take 20 mg by mouth daily.      Marland Kitchen guaiFENesin-dextromethorphan (ROBITUSSIN DM) 100-10 MG/5ML syrup Take 5 mLs by mouth every 4 (four) hours as needed for cough.  118 mL  0  . hydrOXYzine (ATARAX/VISTARIL) 25 MG tablet Take 25 mg by mouth 3 (three) times daily. itching      . levETIRAcetam (KEPPRA) 500 MG tablet Take 1 tablet (500 mg total) by mouth 2 (two) times daily.  60 tablet  3  . losartan (COZAAR) 100 MG tablet Take 100 mg by mouth at bedtime.      . multivitamin (RENA-VIT) TABS tablet Take 1 tablet by mouth at bedtime.      . nicotine (NICODERM CQ - DOSED IN MG/24 HOURS) 21 mg/24hr patch Place 1 patch (21 mg total) onto the skin daily.  28 patch  0  . Nutritional Supplements (FEEDING SUPPLEMENT, NEPRO CARB STEADY,) LIQD Take 237 mLs by mouth 3 (three) times daily as needed (Supplement).    0  .  pantoprazole (PROTONIX) 40 MG tablet Take 40 mg by mouth daily.      . predniSONE (DELTASONE) 10 MG tablet Take 10 mg by mouth daily with breakfast.      . sevelamer carbonate (RENVELA) 800 MG tablet Take 800 mg by mouth 2 (two) times daily.       . VELPHORO 500 MG chewable tablet Chew 500 mg by mouth 2 (two) times daily.         Allergies: Allergies as of 01/21/2014  . (No Known Allergies)   Past Medical History  Diagnosis Date  . COPD (chronic obstructive pulmonary disease)   . Crack cocaine use   . Dental caries   . HTN (hypertension) 06/12/2012  . Hematemesis/vomiting blood   . Shortness of breath     "just when I have too much potassium is too high" (05/15/2013)  . Anemia   . History of blood transfusion 10/2011; 04/2013  . Headache(784.0)     "get it from going to dialysis; when they get real bad I come to hospital" (05/15/2013)  . Arthritis     "qwhere" (05/15/2013)  . End stage renal disease 01/11/2012    Patient presented 11/04/11 to  hospital with CP, wt loss and N/V. Creat was 10, admitted. 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 ANA was + 1:80, +MPO and +pr3 Ab's (ANCA). Received plasmapheresis x 7, IV cytoxan and pred taper. First HD was 11/17/11. 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 Pt d/c'd to do outpt HD and may have gotten 1-2 additional Cytoxan as OP, but this was stopped eventually since renal function didn't recover. Now gets HD TTS schedule at [facility].  L forearm AVF 11/19/11 by Dr. Scot Dock is current access.    Marland Kitchen ESRD (end stage renal disease) on dialysis     TTS: [address] [place] (05/15/2013)  . ESRD (end stage renal disease) on dialysis   . Headache(784.0)   . Depression   . Anxiety    Past Surgical History  Procedure Laterality Date  . Av fistula placement  11/29/2011    Procedure: ARTERIOVENOUS (AV) FISTULA CREATION;  Surgeon: Angelia Mould, MD;  Location: [facility];  Service: Vascular;  Laterality: Left;  . Renal biopsy  11/07/2011  . Inguinal hernia repair Bilateral 1967  . Esophagogastroduodenoscopy N/A 05/17/2013    Procedure: ESOPHAGOGASTRODUODENOSCOPY (EGD);  Surgeon: Beryle Beams, MD;  Location: [facility] ENDOSCOPY;  Service: Endoscopy;  Laterality: N/A;   Family History  Problem Relation Age of Onset  . Heart attack Father    History   Social History  . Marital Status: Single    Spouse Name: N/A    Number of Children: 0  . Years of Education: 9 th   Occupational History  . floor insulation     Disabled   Social History Main Topics  . Smoking status: Current Every Day Smoker -- 1.00 packs/day for 35 years    Types: Cigars, Cigarettes  . Smokeless tobacco: Never Used  . Alcohol Use: No     Comment: 05/15/2013 "aien't drank in ~ 25 yrs"  . Drug Use: Yes    Special: Marijuana, Cocaine     Comment: Precription drugs (e.g. percocet) "I take what I have  to to controll my constant pain" (05/15/2013)  . Sexual Activity: No   Other Topics Concern  . Not on file   Social History  Narrative   ** Merged History Encounter **   Patient lives at home with his parents and he is disabled.   Education 9th grade education   Right handed   Caffeine one cup daily        Review of Systems: Pertinent items are noted in HPI.  Physical Exam: Blood pressure 207/131, pulse 108, temperature 97.9 F (36.6 C), temperature source Axillary, resp. rate 19, height 6\' 5"  (1.956 m), weight 71.501 kg (157 lb 10.1 oz), SpO2 91.00%. General: Responsive and in NAD HEENT: MMM but pale. PERLLA. No lymphedema or masses CV: Regular tachycardia.  3/6 systolic murmur.  L arm fistula with palpable thrill.  JVD to 3cm above sternal angle. Pulm: bilateral lower lung decreased breath sounds/mild crackles. Abd:  NBS in 4Q.  Non-tender to deep and light palpation Ext: No edema or swelling.  Pulses 2+ bilaterally.  Bilateral calf bruising Skin: dry, no suspicious rashes or lesions Neuro: A and A x3. CN grossly intact   Lab results: Basic Metabolic Panel:  Recent Labs  01/21/14 0523  NA 134*  K 7.6*  CL 88*  CO2 15*  GLUCOSE 98  BUN 167*  CREATININE 26.52*  CALCIUM 9.7   Liver Function Tests:  Recent Labs  01/21/14 0523  AST 15  ALT 13  ALKPHOS 70  BILITOT 0.4  PROT 7.4  ALBUMIN 4.1    Recent Labs  01/21/14 0523  LIPASE 24   No results found for this basename: AMMONIA,  in the last 72 hours CBC:  Recent Labs  01/21/14 0523  WBC 5.0  NEUTROABS 3.7  HGB 7.2*  HCT 21.1*  MCV 93.8  PLT 120*   Cardiac Enzymes: Trop .4  BNP: No results found for this basename: PROBNP,  in the last 72 hours D-Dimer: No results found for this basename: DDIMER,  in the last 72 hours CBG: No results found for this basename: GLUCAP,  in the last 72 hours Hemoglobin A1C: No results found for this basename: HGBA1C,  in the last 72 hours Fasting Lipid  Panel: No results found for this basename: CHOL, HDL, LDLCALC, TRIG, CHOLHDL, LDLDIRECT,  in the last 72 hours Thyroid Function Tests: No results found for this basename: TSH, T4TOTAL, FREET4, T3FREE, THYROIDAB,  in the last 72 hours Anemia Panel: No results found for this basename: VITAMINB12, FOLATE, FERRITIN, TIBC, IRON, RETICCTPCT,  in the last 72 hours Coagulation: No results found for this basename: LABPROT, INR,  in the last 72 hours Urine Drug Screen: Drugs of Abuse     Component Value Date/Time   LABOPIA NEGATIVE 11/05/2011 1441   COCAINSCRNUR POSITIVE* 11/05/2011 1441   LABBENZ NEGATIVE 11/05/2011 1441   AMPHETMU NEGATIVE 11/05/2011 1441    Alcohol Level: No results found for this basename: ETH,  in the last 72 hours Urinalysis: No results found for this basename: COLORURINE, APPERANCEUR, LABSPEC, PHURINE, GLUCOSEU, HGBUR, BILIRUBINUR, KETONESUR, PROTEINUR, UROBILINOGEN, NITRITE, LEUKOCYTESUR,  in the last 72 hours Misc. Labs:  Imaging results:  Dg Chest Port 1 View  01/21/2014   CLINICAL DATA:  Shortness of breath  EXAM: PORTABLE CHEST - 1 VIEW  COMPARISON:  Prior radiograph from 11/04/2013  FINDINGS: Cardiomegaly is stable from prior study. Mediastinal silhouette within normal limits.  Lungs are normally inflated. Extensive vascular congestion with indistinctness of the interstitial markings is most consistent with diffuse pulmonary edema. More confluent opacities are present within the right upper and lower lobes, which may reflect superimposed infection. No pneumothorax. Probable tiny bilateral pleural effusions  present.  No acute osseous abnormality.  IMPRESSION: 1. Cardiomegaly with findings suggestive of moderate diffuse pulmonary edema. More confluent opacities within the right upper and lower lobes may reflect superimposed infection in the correct clinical setting. 2. Probable tiny bilateral pleural effusions.   Electronically Signed   By: Jeannine Boga M.D.   On:  01/21/2014 05:17    Other results: EKG: peaked T waves  Assessment: Mr. Harriger is a 48 yo man with a PMH of ESRD on HD, HTN, anemia of CKD, seizure disorder, and a long history of cocaine abuse who presented with dyspnea 2/2 missing 3 dialysis sessions. Stable condition.  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  Patient currently scheduled for HD TID, but has missed the last 3 dialysis sessions.  Renal has been consulted, with plan to dialyze today and tomorrow and reassess. - HD today and tomorrow AM per nephrology reqs - continue sevelamer 800 mg po bid - question nephrology regarding continuation of velphoro -re-check lytes post-HD  #Hyperkalemia: K+ 7.6 on admission with peaked T waves on EKG.  Patient received albuterol, insulin + D50, and calcium gluconate in the ED.  -recheck lytes post-HD - repeat EKG post-HD  #Acute Hypoxic Respiratory Failure: CXR suggestive of moderate diffuse pulmonary edema, which is most likely 2/2 volume overload caused by failure to attend 3 HD sessions.  Cannot rule out PNA, flash pulmonary edema, or HF.  Patient now with stable O2 on [place]. - reassess breathing status post-HD - Supplemental O2 as needed - repeat CXR post-HD  #Hypertension: BP 197/118 on admission.  Most likely due to a combination of long-standing hypertension plus volume overload.  Plan to continue home blood pressure regimen and reassess post-HD - amlodipine 10 mg po qd - carvedilol 25 mg po BID - clonidine .3mg  po BID - losartan 100 mg po qHS  #Anemia of CKD: Appreciate nephrology reqs regarding the use of epogen or similar medication  #Seizure Disorder: Patient has had 1 episode of seizures 6 months ago, and has not since seized.  - continue  home Keppra 500 mg qHS  #Cocaine Use: Last use 4 days ago.  Patient reports multiple unsuccessful attempts to quit - in-patient substance abuse counseling  #Chronic pruritis: continue home regimen - dipenhydramine 25 mg po q6h PRN - hydroxyzine 25 mg po TID PRN  #GERD: continue home regimen - famotidine 20 mg po qd - pantoprazole 10 mg po qd   This is a Careers information officer Note.  The care of the patient was discussed with Dr. Aundra Dubin and the assessment and plan was formulated with their assistance.  Please see their note for official documentation of the patient encounter.   Signed: Charise Killian, Med Student 01/21/2014, 8:44 AM    I have seen the patient and reviewed the daily progress note by Charise Killian MS IV and discussed the care of the patient with them.  Please see my H&P for my findings, assessment, and plans/additions.   Signed:  Jessee Avers, MD PGY-3 Internal Medicine Teaching Service Pager: [phone]

## 2014-01-21 NOTE — Plan of Care (Signed)
Problem: Consults Goal: General Medical Patient Education See Patient Education Module for specific education. Outcome: Completed/Met Date Met:  01/21/14 High blood pressure management education completed

## 2014-01-21 NOTE — ED Notes (Signed)
Nephrology at bedside with the pt.

## 2014-01-21 NOTE — Progress Notes (Deleted)
Date: 01/21/2014               Patient Name:  Jeremy Mccoy MRN: [id number]  DOB: 07/30/1965 Age / Sex: 48 y.o., male   PCP: Leamon Arnt, MD              Medical Service: Internal Medicine Teaching Service              Attending Physician: Dr. Axel Filler, MD    First Contact: Charise Killian MS IV Pager: [phone]  Second Contact: Dr. Aundra Dubin  Pager: [phone]            After Hours (After 5p/  First Contact Pager: [phone]  weekends / holidays): Second Contact Pager: [phone]   Chief Complaint: Shortness of breath  History of Present Illness: This is a 48 year old gentleman, with past medical history of end-stage renal disease on hemodialysis TTS since July 2015, hypertension, cocaine and tobacco abuse, and medication non-compliance. He presents with worsening shortness of breath after missing dialysis for one week. He was attempting to go for his dialysis today, but he was unable and he was routed to the ED by EMS due to severe symptoms. He denies acute chest pain, fevers, chills, nausea or vomiting. His appetite has been good. He denies cough. His blood pressure was found to be severely elevated in the emergency department, and his O2 saturations, were in the 70s. Oxygen saturation improved with supplemental oxygen via BiPAP. Patient continues to report increasing shortness of breath and is providing history in breaking sentences.  Review of Systems: Constitutional: Denies fever, chills, diaphoresis, appetite change and fatigue.  HEENT: Denies photophobia, eye pain, redness, hearing loss, ear pain, congestion, sore throat, rhinorrhea, sneezing, mouth sores, trouble swallowing, neck pain, neck stiffness and tinnitus.  Respiratory: Denies chest tightness, and wheezing.  Cardiovascular: Denies chest pain, palpitations and leg swelling.  Gastrointestinal: Denies nausea, vomiting, abdominal pain, diarrhea, constipation,blood in stool and abdominal distention.  Genitourinary: Does not  make any urine Musculoskeletal: Denies myalgias, back pain, joint swelling, arthralgias and gait problem.  Skin: Denies pallor, rash and wound.  Neurological: Denies dizziness, seizures, syncope, weakness, lightheadedness, numbness and headaches.  Hematological: Denies adenopathy. Reports bruising at the bilateral his legs, no personal or family bleeding history  Psychiatric/Behavioral: Denies suicidal ideation, mood changes, confusion, nervousness, sleep disturbance and agitation  Meds: Current Facility-Administered Medications  Medication Dose Route Frequency Provider Last Rate Last Dose  . hydrALAZINE (APRESOLINE) injection 10 mg  10 mg Intravenous Q4H PRN [name], MD   10 mg at 01/21/14 1103  . morphine 2 MG/ML injection 1 mg  1 mg Intravenous Q30 min PRN [name], MD   1 mg at 01/21/14 1241  . morphine 2 MG/ML injection           . morphine 2 MG/ML injection           . morphine 2 MG/ML injection           . nicardipine (CARDENE) 20mg  in 0.86% saline 237ml IV infusion (0.1 mg/ml)  3-15 mg/hr Intravenous Continuous Jessee Avers, MD        Allergies: Allergies as of 01/21/2014  . (No Known Allergies)   Past Medical History  Diagnosis Date  . COPD (chronic obstructive pulmonary disease)   . Crack cocaine use   . Dental caries   . HTN (hypertension) 06/12/2012  . Hematemesis/vomiting blood   . Shortness of breath     "just  when I have too much potassium is too high" (05/15/2013)  . Anemia   . History of blood transfusion 10/2011; 04/2013  . Headache(784.0)     "get it from going to dialysis; when they get real bad I come to hospital" (05/15/2013)  . Arthritis     "qwhere" (05/15/2013)  . End stage renal disease 01/11/2012    Patient presented 11/04/11 to hospital with CP, wt loss and N/V. Creat was 10, admitted. 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 ANA was + 1:80, +MPO and +pr3 Ab's (ANCA). Received plasmapheresis x 7, IV cytoxan and pred taper. First  HD was 11/17/11. 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 Pt d/c'd to do outpt HD and may have gotten 1-2 additional Cytoxan as OP, but this was stopped eventually since renal function didn't recover. Now gets HD TTS schedule at [facility].  L forearm AVF 11/19/11 by Dr. Scot Dock is current access.    Marland Kitchen ESRD (end stage renal disease) on dialysis     TTS: [address] [place] (05/15/2013)  . ESRD (end stage renal disease) on dialysis   . Headache(784.0)   . Depression   . Anxiety    Past Surgical History  Procedure Laterality Date  . Av fistula placement  11/29/2011    Procedure: ARTERIOVENOUS (AV) FISTULA CREATION;  Surgeon: Angelia Mould, MD;  Location: [facility];  Service: Vascular;  Laterality: Left;  . Renal biopsy  11/07/2011  . Inguinal hernia repair Bilateral 1967  . Esophagogastroduodenoscopy N/A 05/17/2013    Procedure: ESOPHAGOGASTRODUODENOSCOPY (EGD);  Surgeon: Beryle Beams, MD;  Location: [facility] ENDOSCOPY;  Service: Endoscopy;  Laterality: N/A;   Family History  Problem Relation Age of Onset  . Heart attack Father    History   Social History  . Marital Status: Single    Spouse Name: N/A    Number of Children: 0  . Years of Education: 9 th   Occupational History  . floor insulation     Disabled   Social History Main Topics  . Smoking status: Current Every Day Smoker -- 1.00 packs/day for 35 years    Types: Cigars, Cigarettes  . Smokeless tobacco: Never Used  . Alcohol Use: No     Comment: 05/15/2013 "aien't drank in ~ 25 yrs"  . Drug Use: Yes    Special: Marijuana, Cocaine     Comment: Precription drugs (e.g. percocet) "I take what I have to to controll my constant pain" (05/15/2013)  . Sexual Activity: No   Other Topics Concern  . Not on file   Social History Narrative   ** Merged History Encounter **   Patient lives at home with his  parents and he is disabled.   Education 9th grade education   Right handed   Caffeine one cup daily          Physical Exam: Blood pressure 197/117, pulse 120, temperature 97.9 F (36.6 C), temperature source Axillary, resp. rate 21, height 6\' 5"  (1.956 m), weight 157 lb 10.1 oz (71.501 kg), SpO2 99.00%.  General: well developed, well nourished; moderate respiratory distress cooperative with exam Head: atraumatic, normocephalic,  Eye: pupils equal, round and reactive; sclera anicteric; normal conjunctiva  Nose/throat: oropharynx clear, moist mucous membranes, slightly pale gums  Neck: JVD elevated at 3-4 cm Lungs/Chest wall: clear to auscultation bilaterally, increased work of breathing  Heart: normal rate and regular  rhythm; notable for 3/6 systolic murmur in the mitral area. This might be transmitted from his AV fistula Pulses: radial and dorsalis pedis pulses are 2+ and symmetric  Abdomen: Normal fullness, no rebound, guarding, or rigidity; normal bowel sounds; no masses or organomegaly  Skin: warm, dry, intact, normal turgor, no rashes  Extremities: no peripheral edema, clubbing, or cyanosis. 2 areas of ecchymosis on the medial aspect of both his legs Neurologic: A&O X3, CN II - XII are grossly intact. Motor strength is 5/5 in the all 4 extremities Psych: patient is alert and oriented, mood and affect are normal and congruent, thought content is normal without delusions, thought process is linear, speech is normal and non-pressured, behavior is normal  Lab results: Basic Metabolic Panel:  Recent Labs  01/21/14 0523 01/21/14 0533 01/21/14 0735  NA 134* 130* 131*  K 7.6* 7.1* 5.6*  CL 88* 104 103  CO2 15*  --   --   GLUCOSE 98 95 119*  BUN 167* >140* >140*  CREATININE 26.52* >18.00* >18.00*  CALCIUM 9.7  --   --    Liver Function Tests:  Recent Labs  01/21/14 0523  AST 15  ALT 13  ALKPHOS 70  BILITOT 0.4  PROT 7.4  ALBUMIN 4.1    Recent Labs  01/21/14 0523    LIPASE 24   No results found for this basename: AMMONIA,  in the last 72 hours CBC:  Recent Labs  01/21/14 0523 01/21/14 0533 01/21/14 0735  WBC 5.0  --   --   NEUTROABS 3.7  --   --   HGB 7.2* 8.2* 6.8*  HCT 21.1* 24.0* 20.0*  MCV 93.8  --   --   PLT 120*  --   --    Urine Drug Screen: Drugs of Abuse     Component Value Date/Time   LABOPIA NEGATIVE 11/05/2011 1441   COCAINSCRNUR POSITIVE* 11/05/2011 1441   LABBENZ NEGATIVE 11/05/2011 1441   AMPHETMU NEGATIVE 11/05/2011 1441     Other results: EKG: normal sinus rhythm, sinus tachycardia, tall tented T waves.  Assessment & Plan by Problem: Principal Problem:   ESRD on hemodialysis Active Problems:   Tobacco abuse   Cocaine abuse   Pulmonary edema   Anemia   Acute respiratory failure   Hyperkalemia   Malignant hypertension   High anion gap metabolic acidosis   ESRD on dialysis   Acute Hypoxic respiratory failure: He was hypoxic on presentation with oxygen saturation of 70s on RA. He required BiPAP with subsequent improvement in respiratory status. Currently receiving 3 L via nasal cannula and oxygenating above 90%. His lung exam has been unremarkable, without wheezing. Likely his hypoxia is due to pulmonary edema in the setting of ESRD. His portable chest x-ray revealed cardiomegaly with moderate pulmonary edema. There was a question of opacities in the right upper and lower large which raised a concern of superimposed infection. However, patient denies fevers, chills, or any other symptoms of pneumonia. Given his suppressive immunotherapy with steroid use and hemodialysis, definitely, a possibility of hospital-acquired pneumonia remains. He will require repeat 2 view chest x-ray after hemodialysis for further of his infiltrates. If these remain, antibiotic therapy can be considered but will hold off for now. He has 1 point (Tachycardia) on Well's score for pulmonary embolism, which makes it less likely, especially giving a  confluent of other clinical explanations of his tachycardia. Patient has a questionable history of COPD, but has no documented pulmonary function tests. Does not take  bronchodilators at home. Plan. - admit to tele - Respiratory status likely to improve with dialysis. - Supplementally oxygen to keep O2 88-92 - 2 view x-ray after volume removal with HD - Will monitor clinically for any concern of pneumonia. - May be benefit from bronchodilators on an as needed basis  ESRD on HD: In the setting of hemodialysis noncompliance. 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 On TTS schedule at [facility] since 2013. Last dialysis was last week. His presentation with acute hypoxic respiratory failure, malignant hypertension, and hyperkalemia are related to noncompliance with dialysis. Plan. -Nephrology service has been consulted and patient will get dialysis today and tomorrow. -Renal diet with fluid restriction of not more than 1200 cc daily -Continue with prednisone 10 mg daily  Anion gap metabolic acidosis: In the setting of chronic renal failure. BUN of 167. CO2 of 15. Arterial blood gas with pH of 7.33, PCO2 of 34.5, and oxygen saturation 100%. Likely he is anion gap metabolic acidosis is due to uremia. He denies toxin ingestion of ethylene glycol, ethanol, or methanol. Plan -Monitor with BMP   Hyperkalemia: In the setting of HD noncompliance. Potassium level of 7.6 on presentation with electrocardiographic changes. 2 months ago, his potassium was 5.6. He received treatment in the ED with calcium gluconate, neb albuterol, insulin, with dextrose 50% solution. Plan - Will repeat a BMP to check potassium level - Will repeat EKG, as well - If his potassium level remains high, we'll may repeat calcium gluconate, insulin, and dextrose in addition to nebulized albuterol - Avoid high potassium foods - Keep patient on telemetry  Malignant hypertension: Blood pressure on  presentation was 207/131. The patient reports poor compliance with his home regimen including amlodipine, 10 mg daily, clonidine 0.2 mg daily, carvedilol 25 mg twice daily, and losartan 100 mg daily.  Plan. - Blood pressure likely to improve with hemodialysis  - Continue with amlodipine 10 mg daily. - Continue with clonidine 0.2 mg twice a day. - Continue with albuterol 25 mg 2 times a day - Continue with losartan 100 mg daily   Chronic normocytic anemia: Likely related to chronic kidney disease. Baseline hemoglobin of between 7-8 per presentation today with hemoglobin of 7.2. No signs of active bleeding. Patient says, that they're recently had a normal FOBT testing. Plan. -Monitor closely -May need transfusion if hemoglobin drops less than 7   Seizure disorder: in 06/2013 >>PRES w status epilepticus rx with intubation and antiepileptics, was dc'd on two seizure medications. He takes Keppra 500 mg twice daily. Has been seizure-free since that admission in February 2015. Follows up with outpatient neurology. Plan. -Continue with Keppra 500 twice a day -Control blood pressure aggressively with hemodialysis, and medications   Cocaine and tobacco abuse:  Endorses recent cocaine use 4 days ago. Has been in outpatient rehabilitation several times without success. Will consult social work   Dispo: Disposition is deferred at this time, awaiting improvement of current medical problems. Anticipated discharge in approximately 2 day(s).   The patient does have a current PCP Leamon Arnt, MD), therefore is not require OPC follow-up after discharge.   The patient does not have transportation limitations that hinder transportation to clinic appointments.   Signed:  Jessee Avers, MD PGY-3 Internal Medicine Teaching Service Pager: [phone] (7pm-7am) 01/21/2014, 1:48 PM

## 2014-01-21 NOTE — ED Notes (Signed)
Respiratory at the bedside, placed patient on Bipap.

## 2014-01-21 NOTE — ED Notes (Signed)
CBG is 94, reported to Dr. Claudine Mouton. MD requests 2 grams of calcium gluconate to be given.

## 2014-01-21 NOTE — H&P (Signed)
Date: 01/21/2014               Patient Name:  Jeremy Mccoy MRN: [id number]  DOB: 1965/12/08 Age / Sex: 48 y.o., male   PCP: Leamon Arnt, MD              Medical Service: Internal Medicine Teaching Service              Attending Physician: Dr. Axel Filler, MD    First Contact: Charise Killian MS IV Pager: [phone]  Second Contact: Dr. Aundra Dubin  Pager: [phone]            After Hours (After 5p/  First Contact Pager: [phone]  weekends / holidays): Second Contact Pager: [phone]   Chief Complaint: Shortness of breath  History of Present Illness: This is a 48 year old gentleman, with past medical history of end-stage renal disease on hemodialysis TTS since July 2015, hypertension, cocaine and tobacco abuse, and medication non-compliance. He presents with worsening shortness of breath after missing dialysis for one week. He was attempting to go for his dialysis today, but he was unable and he was routed to the ED by EMS due to severe symptoms. He denies acute chest pain, fevers, chills, nausea or vomiting. His appetite has been good. He denies cough. His blood pressure was found to be severely elevated in the emergency department, and his O2 saturations, were in the 70s. Oxygen saturation improved with supplemental oxygen via BiPAP. Patient continues to report increasing shortness of breath and is providing history in breaking sentences.  Review of Systems: Constitutional: Denies fever, chills, diaphoresis, appetite change and fatigue.  HEENT: Denies photophobia, eye pain, redness, hearing loss, ear pain, congestion, sore throat, rhinorrhea, sneezing, mouth sores, trouble swallowing, neck pain, neck stiffness and tinnitus.  Respiratory: Denies chest tightness, and wheezing.  Cardiovascular: Denies chest pain, palpitations and leg swelling.  Gastrointestinal: Denies nausea, vomiting, abdominal pain, diarrhea, constipation,blood in stool and abdominal distention.  Genitourinary: Does not  make any urine Musculoskeletal: Denies myalgias, back pain, joint swelling, arthralgias and gait problem.  Skin: Denies pallor, rash and wound.  Neurological: Denies dizziness, seizures, syncope, weakness, lightheadedness, numbness and headaches.  Hematological: Denies adenopathy. Reports bruising at the bilateral his legs, no personal or family bleeding history  Psychiatric/Behavioral: Denies suicidal ideation, mood changes, confusion, nervousness, sleep disturbance and agitation  Meds: Current Facility-Administered Medications  Medication Dose Route Frequency Provider Last Rate Last Dose  . 0.9 %  sodium chloride infusion  100 mL Intravenous PRN [name], MD      . 0.9 %  sodium chloride infusion  100 mL Intravenous PRN [name], MD      . alteplase (CATHFLO ACTIVASE) injection 2 mg  2 mg Intracatheter Once PRN [name], MD      . amLODipine (NORVASC) tablet 10 mg  10 mg Oral Daily Jessee Avers, MD      . carvedilol (COREG) tablet 12.5 mg  12.5 mg Oral BID WC Donita Brooks, NP      . cloNIDine (CATAPRES) tablet 0.2 mg  0.2 mg Oral BID Jessee Avers, MD      . diphenhydrAMINE (BENADRYL) tablet 25 mg  25 mg Oral Q6H PRN Jessee Avers, MD      . feeding supplement (ENSURE COMPLETE) (ENSURE COMPLETE) liquid 237 mL  237 mL Oral BID BM Brandi L Ollis, NP      . feeding supplement (NEPRO CARB STEADY) liquid 237 mL  237  mL Oral PRN [name], MD      . fentaNYL (SUBLIMAZE) 0.05 MG/ML injection           . fentaNYL (SUBLIMAZE) injection 25-50 mcg  25-50 mcg Intravenous Q2H PRN Donita Brooks, NP   50 mcg at 01/21/14 1422  . heparin injection 1,000 Units  1,000 Units Dialysis PRN [name], MD      . heparin injection 5,000 Units  5,000 Units Subcutaneous 3 times per day Jessee Avers, MD      . hydrALAZINE (APRESOLINE) injection 10-20 mg  10-20 mg Intravenous Q4H PRN Donita Brooks, NP      . levETIRAcetam (KEPPRA) tablet 500 mg  500 mg Oral BID  Jessee Avers, MD      . lidocaine (PF) (XYLOCAINE) 1 % injection 5 mL  5 mL Intradermal PRN [name], MD      . lidocaine-prilocaine (EMLA) cream 1 application  1 application Topical PRN [name], MD      . losartan (COZAAR) tablet 100 mg  100 mg Oral QHS Jessee Avers, MD      . morphine 2 MG/ML injection           . morphine 2 MG/ML injection           . morphine 2 MG/ML injection           . nicardipine (CARDENE) 20mg  in 0.86% saline 219ml IV infusion (0.1 mg/ml)  3-15 mg/hr Intravenous Continuous Donita Brooks, NP      . nicotine (NICODERM CQ - dosed in mg/24 hours) patch 21 mg  21 mg Transdermal Daily Jessee Avers, MD      . pantoprazole (PROTONIX) EC tablet 40 mg  40 mg Oral Daily Jessee Avers, MD      . pentafluoroprop-tetrafluoroeth (GEBAUERS) aerosol 1 application  1 application Topical PRN [name], MD      . Derrill Memo ON 01/22/2014] predniSONE (DELTASONE) tablet 10 mg  10 mg Oral Q breakfast Jessee Avers, MD        Allergies: Allergies as of 01/21/2014  . (No Known Allergies)   Past Medical History  Diagnosis Date  . COPD (chronic obstructive pulmonary disease)   . Crack cocaine use   . Dental caries   . HTN (hypertension) 06/12/2012  . Hematemesis/vomiting blood   . Shortness of breath     "just when I have too much potassium is too high" (05/15/2013)  . Anemia   . History of blood transfusion 10/2011; 04/2013  . Headache(784.0)     "get it from going to dialysis; when they get real bad I come to hospital" (05/15/2013)  . Arthritis     "qwhere" (05/15/2013)  . End stage renal disease 01/11/2012    Patient presented 11/04/11 to hospital with CP, wt loss and N/V. Creat was 10, admitted. 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 ANA was + 1:80, +MPO and +pr3 Ab's (ANCA). Received plasmapheresis x 7, IV cytoxan and pred taper. First HD was 11/17/11. 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 Pt d/c'd to do outpt HD and may have gotten 1-2 additional Cytoxan as OP, but this was stopped eventually since renal function didn't recover. Now gets HD TTS schedule at [facility].  L forearm AVF 11/19/11 by Dr. Scot Dock is current access.    Marland Kitchen ESRD (end stage renal  disease) on dialysis     TTS: [address] [place] (05/15/2013)  . ESRD (end stage renal disease) on dialysis   . Headache(784.0)   . Depression   . Anxiety    Past Surgical History  Procedure Laterality Date  . Av fistula placement  11/29/2011    Procedure: ARTERIOVENOUS (AV) FISTULA CREATION;  Surgeon: Angelia Mould, MD;  Location: [place];  Service: Vascular;  Laterality: Left;  . Renal biopsy  11/07/2011  . Inguinal hernia repair Bilateral 1967  . Esophagogastroduodenoscopy N/A 05/17/2013    Procedure: ESOPHAGOGASTRODUODENOSCOPY (EGD);  Surgeon: Beryle Beams, MD;  Location: [facility] ENDOSCOPY;  Service: Endoscopy;  Laterality: N/A;   Family History  Problem Relation Age of Onset  . Heart attack Father    History   Social History  . Marital Status: Single    Spouse Name: N/A    Number of Children: 0  . Years of Education: 9 th   Occupational History  . floor insulation     Disabled   Social History Main Topics  . Smoking status: Current Every Day Smoker -- 1.00 packs/day for 35 years    Types: Cigars, Cigarettes  . Smokeless tobacco: Never Used  . Alcohol Use: No     Comment: 05/15/2013 "aien't drank in ~ 25 yrs"  . Drug Use: Yes    Special: Marijuana, Cocaine     Comment: Precription drugs (e.g. percocet) "I take what I have to to controll my constant pain" (05/15/2013)  . Sexual Activity: No   Other Topics Concern  . Not on file   Social History Narrative   ** Merged History Encounter **   Patient lives at home with his parents and he is disabled.   Education 9th grade education   Right handed   Caffeine one cup daily            Physical Exam: Blood pressure 197/117, pulse 120, temperature 97.9 F (36.6 C), temperature source Axillary, resp. rate 21, height 6\' 5"  (1.956 m), weight 164 lb 3.9 oz (74.5 kg), SpO2 99.00%.  General: well developed, well nourished; moderate respiratory distress cooperative with exam Head: atraumatic, normocephalic,  Eye: pupils equal, round and reactive; sclera anicteric; normal conjunctiva  Nose/throat: oropharynx clear, moist mucous membranes, slightly pale gums  Neck: JVD elevated at 3-4 cm Lungs/Chest wall: clear to auscultation bilaterally, increased work of breathing  Heart: normal rate and regular rhythm; notable for 3/6 systolic murmur in the mitral area. This might be transmitted from his AV fistula Pulses: radial and dorsalis pedis pulses are 2+ and symmetric  Abdomen: Normal fullness, no rebound, guarding, or rigidity; normal bowel sounds; no masses or organomegaly  Skin: warm, dry, intact, normal turgor, no rashes  Extremities: no peripheral edema, clubbing, or cyanosis. 2 areas of ecchymosis on the medial aspect of both his legs Neurologic: A&O X3, CN II - XII are grossly intact. Motor strength is 5/5 in the all 4 extremities Psych: patient is alert and oriented, mood and affect are normal and congruent, thought content is normal without delusions, thought process is linear, speech is normal and non-pressured, behavior is normal  Lab results: Basic Metabolic Panel:  Recent Labs  01/21/14 0523 01/21/14 0533 01/21/14 0735  NA 134* 130* 131*  K 7.6* 7.1* 5.6*  CL 88* 104 103  CO2 15*  --   --   GLUCOSE 98 95 119*  BUN 167* >140* >140*  CREATININE 26.52* >18.00* >18.00*  CALCIUM 9.7  --   --  Liver Function Tests:  Recent Labs  01/21/14 0523  AST 15  ALT 13  ALKPHOS 70  BILITOT 0.4  PROT 7.4  ALBUMIN 4.1    Recent Labs  01/21/14 0523  LIPASE 24   No results found for this basename: AMMONIA,  in the last 72 hours CBC:  Recent Labs   01/21/14 0523 01/21/14 0533 01/21/14 0735  WBC 5.0  --   --   NEUTROABS 3.7  --   --   HGB 7.2* 8.2* 6.8*  HCT 21.1* 24.0* 20.0*  MCV 93.8  --   --   PLT 120*  --   --    Urine Drug Screen: Drugs of Abuse     Component Value Date/Time   LABOPIA NEGATIVE 11/05/2011 1441   COCAINSCRNUR POSITIVE* 11/05/2011 1441   LABBENZ NEGATIVE 11/05/2011 1441   AMPHETMU NEGATIVE 11/05/2011 1441     Other results: EKG: normal sinus rhythm, sinus tachycardia, tall tented T waves.  Assessment & Plan by Problem: Principal Problem:   ESRD on hemodialysis Active Problems:   Tobacco abuse   Cocaine abuse   Pulmonary edema   Anemia   Acute respiratory failure   Hyperkalemia   Malignant hypertension   High anion gap metabolic acidosis   ESRD on dialysis   Acute Hypoxic respiratory failure: He was hypoxic on presentation with oxygen saturation of 70s on RA. He required BiPAP with subsequent improvement in respiratory status. Currently receiving 3 L via nasal cannula and oxygenating above 90%. His lung exam has been unremarkable, without wheezing. Likely his hypoxia is due to pulmonary edema in the setting of ESRD. His portable chest x-ray revealed cardiomegaly with moderate pulmonary edema. There was a question of opacities in the right upper and lower large which raised a concern of superimposed infection. However, patient denies fevers, chills, or any other symptoms of pneumonia. Given his suppressive immunotherapy with steroid use and hemodialysis, definitely, a possibility of hospital-acquired pneumonia remains. He will require repeat 2 view chest x-ray after hemodialysis for further of his infiltrates. If these remain, antibiotic therapy can be considered but will hold off for now. He has 1 point (Tachycardia) on Well's score for pulmonary embolism, which makes it less likely, especially giving a confluent of other clinical explanations of his tachycardia. Patient has a questionable history of COPD, but  has no documented pulmonary function tests. Does not take bronchodilators at home. Plan. - admit to tele - Respiratory status likely to improve with dialysis. - Supplementally oxygen to keep O2 88-92 - 2 view x-ray after volume removal with HD - Will monitor clinically for any concern of pneumonia. - May be benefit from bronchodilators on an as needed basis  ESRD on HD: In the setting of hemodialysis noncompliance. 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 On TTS schedule at [facility] since 2013. Last dialysis was last week. His presentation with acute hypoxic respiratory failure, malignant hypertension, and hyperkalemia are related to noncompliance with dialysis. Plan. -Nephrology service has been consulted and patient will get dialysis today and tomorrow. -Renal diet with fluid restriction of not more than 1200 cc daily -Continue with prednisone 10 mg daily  Anion gap metabolic acidosis: In the setting of chronic renal failure. BUN of 167. CO2 of 15. Arterial blood gas with pH of 7.33, PCO2 of 34.5, and oxygen saturation 100%. Likely he is anion gap metabolic acidosis is due to uremia. He denies toxin ingestion of ethylene glycol, ethanol, or methanol. Plan -Monitor with BMP  Hyperkalemia: In the setting of HD noncompliance. Potassium level of 7.6 on presentation with electrocardiographic changes. 2 months ago, his potassium was 5.6. He received treatment in the ED with calcium gluconate, neb albuterol, insulin, with dextrose 50% solution. Plan - Will repeat a BMP to check potassium level - Will repeat EKG, as well - If his potassium level remains high, we'll may repeat calcium gluconate, insulin, and dextrose in addition to nebulized albuterol - Avoid high potassium foods - Keep patient on telemetry  Malignant hypertension: Blood pressure on presentation was 207/131. The patient reports poor compliance with his home regimen including amlodipine, 10 mg daily,  clonidine 0.2 mg daily, carvedilol 25 mg twice daily, and losartan 100 mg daily.  Plan. - Blood pressure likely to improve with hemodialysis  - Continue with amlodipine 10 mg daily. - Continue with clonidine 0.2 mg twice a day. - Continue with albuterol 25 mg 2 times a day - Continue with losartan 100 mg daily   Chronic normocytic anemia: Likely related to chronic kidney disease. Baseline hemoglobin of between 7-8 per presentation today with hemoglobin of 7.2. No signs of active bleeding. Patient says, that they're recently had a normal FOBT testing. Plan. -Monitor closely -May need transfusion if hemoglobin drops less than 7   Seizure disorder: in 06/2013 >>PRES w status epilepticus rx with intubation and antiepileptics, was dc'd on two seizure medications. He takes Keppra 500 mg twice daily. Has been seizure-free since that admission in February 2015. Follows up with outpatient neurology. Plan. -Continue with Keppra 500 twice a day -Control blood pressure aggressively with hemodialysis, and medications   Cocaine and tobacco abuse:  Endorses recent cocaine use 4 days ago. Has been in outpatient rehabilitation several times without success. Will consult social work   Dispo: Disposition is deferred at this time, awaiting improvement of current medical problems. Anticipated discharge in approximately 2 day(s).   The patient does have a current PCP Leamon Arnt, MD), therefore is not require OPC follow-up after discharge.   The patient does not have transportation limitations that hinder transportation to clinic appointments.   Signed:  Jessee Avers, MD PGY-3 Internal Medicine Teaching Service Pager: [phone] (7pm-7am) 01/21/2014, 3:04 PM

## 2014-01-21 NOTE — ED Notes (Signed)
Results of chem 8 given to Dr. Claudine Mouton

## 2014-01-21 NOTE — H&P (Signed)
Internal Medicine Attending Admission Note Date: 01/21/2014  Patient name: Jeremy Mccoy Medical record number: [id number] Date of birth: 1966-03-07 Age: 48 y.o. Gender: male  I saw and evaluated the patient. I reviewed the resident's note and I agree with the resident's findings and plan as documented in the resident's note, with the following additional comments.  Chief Complaint(s): Shortness of breath  History - key components related to admission: Patient is a 48 year old man with history of end-stage renal disease on hemodialysis, hypertension, COPD, anemia, and other problems as outlined in medical history admitted with shortness of breath, hypoxia, and respiratory distress after missing last 3 dialysis treatments.     Physical Exam - key components related to admission:  Filed Vitals:   01/21/14 1500 01/21/14 1515 01/21/14 1520 01/21/14 1530  BP: 191/123 194/119  184/107  Pulse: 111 111  120  Temp:   98.7 F (37.1 C)   TempSrc:   Oral   Resp: 28 29  18   Height:      Weight:      SpO2: 90% 96%  98%    General: Alert, no acute distress Lungs: Bibasilar crackles in the lower half of lung fields Heart: Regular, tachycardic Abdomen: Bowel sounds present, soft, nontender Extremities: No edema   Lab results:   Basic Metabolic Panel:  Recent Labs  01/21/14 0523 01/21/14 0533 01/21/14 0735  NA 134* 130* 131*  K 7.6* 7.1* 5.6*  CL 88* 104 103  CO2 15*  --   --   GLUCOSE 98 95 119*  BUN 167* >140* >140*  CREATININE 26.52* >18.00* >18.00*  CALCIUM 9.7  --   --     Liver Function Tests:  Recent Labs  01/21/14 0523  AST 15  ALT 13  ALKPHOS 70  BILITOT 0.4  PROT 7.4  ALBUMIN 4.1    Recent Labs  01/21/14 0523  LIPASE 24    CBC:  Recent Labs  01/21/14 0523 01/21/14 0533 01/21/14 0735  WBC 5.0  --   --   HGB 7.2* 8.2* 6.8*  HCT 21.1* 24.0* 20.0*  MCV 93.8  --   --   PLT 120*  --   --     Recent Labs  01/21/14 0523  NEUTROABS 3.7   LYMPHSABS 0.9  MONOABS 0.3  EOSABS 0.0  BASOSABS 0.0      Imaging results:  Dg Chest Port 1 View  01/21/2014   CLINICAL DATA:  Shortness of breath  EXAM: PORTABLE CHEST - 1 VIEW  COMPARISON:  Prior radiograph from 11/04/2013  FINDINGS: Cardiomegaly is stable from prior study. Mediastinal silhouette within normal limits.  Lungs are normally inflated. Extensive vascular congestion with indistinctness of the interstitial markings is most consistent with diffuse pulmonary edema. More confluent opacities are present within the right upper and lower lobes, which may reflect superimposed infection. No pneumothorax. Probable tiny bilateral pleural effusions present.  No acute osseous abnormality.  IMPRESSION: 1. Cardiomegaly with findings suggestive of moderate diffuse pulmonary edema. More confluent opacities within the right upper and lower lobes may reflect superimposed infection in the correct clinical setting. 2. Probable tiny bilateral pleural effusions.   Electronically Signed   By: Jeannine Boga M.D.   On: 01/21/2014 05:17    Other results: EKG: Sinus tachycardia; peaked t waves, concerning for hyperkalemia  Assessment & Plan by Problem:  1.  Hypertensive emergency.  Patient presented with severe hypertension and pulmonary edema with hypoxic respiratory failure after missing 3 hemodialysis sessions.  He remained  severely hypertensive despite hemodialysis, with lung exam consistent with persisting pulmonary edema.  Critical care was consulted and patient was transferred to critical care service for ICU admission and management of hypertensive emergency.  2.  End-stage renal disease on hemodialysis.  Management as per nephrology service.  3.  Hyperkalemia due to missed dialysis.  Improved following initial dialysis session.  4.  Other problems and plans as per the resident physician's note.  5.  Patient has now been transferred to the critical care service.  If we can be of further  help, please let us know.

## 2014-01-21 NOTE — ED Notes (Signed)
Dr. Oni at the bedside.  

## 2014-01-21 NOTE — ED Notes (Signed)
Pt delayed being taken to dialysis for MD with nephrology at bedside.

## 2014-01-21 NOTE — ED Notes (Signed)
Results of chem 8 given to Dr. Claudine Mouton on [facility]

## 2014-01-21 NOTE — ED Notes (Signed)
Clarified, Dr. Claudine Mouton ordered Istat for quick result on K+ level.

## 2014-01-21 NOTE — Procedures (Signed)
I was present at this dialysis session, have reviewed the session itself and made  appropriate changes  Kelly Splinter MD (pgr) [phone]    ([phone] 01/21/2014, 9:12 AM

## 2014-01-21 NOTE — Progress Notes (Signed)
Patient having chest pain in the center of his chest describes the pain are soreness and painful and non-radiating. He rates the pain 10/10. EKG done and SL nitro given. Patient was on 3L [place], placed on a non-rebreather mask. Dr. Gar Gibbon notified by telephone. No new orders given. Will continue to monitor.  Rosaland Shiffman, Lauralyn Primes, RN

## 2014-01-21 NOTE — Consult Note (Signed)
PULMONARY / CRITICAL CARE MEDICINE   Name: Jeremy Mccoy MRN: [id number] DOB: 1966-02-26    ADMISSION DATE:  01/21/2014 CONSULTATION DATE:  01/21/14  REFERRING MD :  Dr. 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  Patient noted to have hypertensive emergency.  Transported directly to HD with 4.5L removed.  Tx to ICU post HD for BP control.   STUDIES:  9/08 CXR > moderate diffuse pulmonary edema  SIGNIFICANT EVENTS: 9/08  Admit with SOB.  Missed last 3 HD sessions, CXR with pulmonary edema.  Profoundly hypertensive.    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  Patient reports non-compliance with HD sessions and became progressively more short of breath.  He activated EMS and was noted to have saturations in the 70's on room air upon arrival.  He was placed on CPAP for transport with improvement in symptoms / oxygen saturation.  He denies fevers, chills, n/v/d, chest pain, pain with inspiration, cough or sputum production.    ER evaluation noted the patient to have hypertensive emergency with pressures in the [id number] systolic.  He was transported directly to HD with 4.5L removed without significant improvement in BP.  He was medicated 1x with 10 mg hydralazine.  Tx to ICU post HD for BP control & PCCM consulted.  Currently he reports improvement in shortness of breath but reports headache.    PAST MEDICAL HISTORY :  Past Medical History  Diagnosis Date  . COPD (chronic obstructive pulmonary disease)   . Crack cocaine use   . Dental caries   . HTN (hypertension)  06/12/2012  . Hematemesis/vomiting blood   . Shortness of breath     "just when I have too much potassium is too high" (05/15/2013)  . Anemia   . History of blood transfusion 10/2011; 04/2013  . Headache(784.0)     "get it from going to dialysis; when they get real bad I come to hospital" (05/15/2013)  . Arthritis     "qwhere" (05/15/2013)  . End stage renal disease 01/11/2012    Patient presented 11/04/11 to hospital with CP, wt loss and N/V. Creat was 10, admitted. 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 ANA was + 1:80, +MPO and +pr3 Ab's (ANCA). Received plasmapheresis x 7, IV cytoxan and pred taper. First HD was 11/17/11. 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 Pt d/c'd to do outpt HD and may have gotten 1-2 additional Cytoxan as OP, but this was stopped eventually since renal function didn't recover. Now gets HD TTS schedule at [facility].  L forearm AVF 11/19/11 by Dr. Scot Dock is current access.    Marland Kitchen ESRD (end stage renal disease) on dialysis     TTS: [address] [place] (05/15/2013)  . ESRD (end stage renal disease) on dialysis   . Headache(784.0)   . Depression   . Anxiety    Past Surgical History  Procedure Laterality Date  . Av fistula placement  11/29/2011    Procedure: ARTERIOVENOUS (AV) FISTULA CREATION;  Surgeon: Angelia Mould, MD;  Location: [place];  Service: Vascular;  Laterality: Left;  . Renal biopsy  11/07/2011  . Inguinal hernia repair Bilateral 1967  . Esophagogastroduodenoscopy N/A 05/17/2013    Procedure: ESOPHAGOGASTRODUODENOSCOPY (EGD);  Surgeon: Beryle Beams, MD;  Location: [facility] ENDOSCOPY;  Service: Endoscopy;  Laterality: N/A;   Prior to Admission medications   Medication Sig Start Date End Date Taking? Authorizing Provider  acetaminophen (TYLENOL) 500 MG tablet Take 1,000 mg by mouth every 6 (six) hours as needed. pain   Yes  Historical Provider, MD  amLODipine (NORVASC) 10 MG tablet Take 10 mg by mouth daily.   Yes Historical Provider, MD  b complex vitamins tablet Take 1 tablet by mouth daily.   Yes Historical Provider, MD  carvedilol (COREG) 25 MG tablet Take 1 tablet (25 mg total) by mouth 2 (two) times daily with a meal. On Monday-Wednesday-Friday-Sunday (non-HD days); on dialysis days (Tuesday-Thursday-Saturday) take it once a day (evening time only). 07/19/13  Yes Barton Dubois, MD  cloNIDine (CATAPRES) 0.2 MG tablet Take 1 tablet (0.2 mg total) by mouth 2 (two) times daily. 11/08/13  Yes Reyne Dumas, MD  diphenhydrAMINE (BENADRYL) 25 MG tablet Take 25 mg by mouth every 6 (six) hours as needed for itching.   Yes Historical Provider, MD  famotidine (PEPCID) 20 MG tablet Take 20 mg by mouth daily.   Yes Historical Provider, MD  guaiFENesin-dextromethorphan (ROBITUSSIN DM) 100-10 MG/5ML syrup Take 5 mLs by mouth every 4 (four) hours as needed for cough. 11/08/13  Yes Reyne Dumas, MD  hydrOXYzine (ATARAX/VISTARIL) 25 MG tablet Take 25 mg by mouth 3 (three) times daily. itching   Yes Historical Provider, MD  levETIRAcetam (KEPPRA) 500 MG tablet Take 1 tablet (500 mg total) by mouth 2 (two) times daily. 09/19/13  Yes Hulen Luster, DO  losartan (COZAAR) 100 MG tablet Take 100 mg by mouth at bedtime.   Yes Historical Provider, MD  multivitamin (RENA-VIT) TABS tablet Take 1 tablet by mouth at bedtime. 11/29/11  Yes Myriam Jacobson, PA-C  nicotine (NICODERM CQ - DOSED IN MG/24 HOURS) 21 mg/24hr patch Place 1 patch (21 mg total) onto the skin daily. 11/08/13  Yes Reyne Dumas, MD  Nutritional Supplements (FEEDING SUPPLEMENT, NEPRO CARB STEADY,) LIQD Take 237 mLs by mouth 3 (three) times daily as needed (Supplement). 05/17/13  Yes Maryann Mikhail, DO  pantoprazole (PROTONIX) 40 MG tablet Take 40 mg by mouth daily.   Yes Historical Provider, MD  predniSONE (DELTASONE) 10 MG tablet Take 10 mg by mouth daily with breakfast.   Yes  Historical Provider, MD  sevelamer carbonate (RENVELA) 800 MG tablet Take 800 mg by mouth 2 (two) times daily.    Yes Historical Provider, MD  VELPHORO 500 MG chewable tablet Chew 500 mg by mouth 2 (two) times daily.  09/11/13  Yes Historical Provider, MD   No Known Allergies  FAMILY HISTORY:  Family History  Problem Relation Age of Onset  . Heart attack Father    SOCIAL HISTORY:  reports that he has been smoking Cigars and Cigarettes.  He has a 35 pack-year smoking history. He has never used smokeless tobacco. He reports that he uses illicit drugs (Marijuana and Cocaine). He reports that he does not drink alcohol.  REVIEW OF SYSTEMS:   See HPI for pertinent positives.  Pt reports improved SOB post HD.  Complains of headache.    SUBJECTIVE:   VITAL  SIGNS: Temp:  [97.9 F (36.6 C)] 97.9 F (36.6 C) (09/08 0515) Pulse Rate:  [96-120] 120 (09/08 1326) Resp:  [18-34] 21 (09/08 1326) BP: (181-207)/(108-132) 197/117 mmHg (09/08 1326) SpO2:  [91 %-100 %] 99 % (09/08 0908) Weight:  [157 lb 10.1 oz (71.501 kg)] 157 lb 10.1 oz (71.501 kg) (09/08 0515)  HEMODYNAMICS:    VENTILATOR SETTINGS:    INTAKE / OUTPUT: No intake or output data in the 24 hours ending 01/21/14 1414  PHYSICAL EXAMINATION: General:  wdwn adult male in NAD Neuro:  AAOx4, speech clear, MAE, strength 5/5   HEENT:  Mm pink/moist, jvd+ Cardiovascular:  s1s2 rrr no m/r/g Lungs:  resp's even/non-labored, lungs bilaterally diminished  Abdomen:  Round/soft, bsx4 active  Musculoskeletal:  No acute deformities  Skin:  Warm/dry, no edema.  L FA graft c/d/i  LABS:  CBC  Recent Labs Lab 01/21/14 0523 01/21/14 0533 01/21/14 0735  WBC 5.0  --   --   HGB 7.2* 8.2* 6.8*  HCT 21.1* 24.0* 20.0*  PLT 120*  --   --    Coag's No results found for this basename: APTT, INR,  in the last 168 hours BMET  Recent Labs Lab 01/21/14 0523 01/21/14 0533 01/21/14 0735  NA 134* 130* 131*  K 7.6* 7.1* 5.6*  CL 88* 104 103   CO2 15*  --   --   BUN 167* >140* >140*  CREATININE 26.52* >18.00* >18.00*  GLUCOSE 98 95 119*   Electrolytes  Recent Labs Lab 01/21/14 0523  CALCIUM 9.7   Sepsis Markers No results found for this basename: LATICACIDVEN, PROCALCITON, O2SATVEN,  in the last 168 hours ABG  Recent Labs Lab 01/21/14 0734  PHART 7.332*  PCO2ART 34.5*  PO2ART 391.0*   Liver Enzymes  Recent Labs Lab 01/21/14 0523  AST 15  ALT 13  ALKPHOS 70  BILITOT 0.4  ALBUMIN 4.1   Cardiac Enzymes No results found for this basename: TROPONINI, PROBNP,  in the last 168 hours Glucose No results found for this basename: GLUCAP,  in the last 168 hours  Imaging No results found.   ASSESSMENT / PLAN:  PULMONARY OETT n/a  A: Acute Hypoxic Respiratory Failure - in setting of pulmonary edema / volume overload Pulmonary Edema  Hx COPD - ? Severity, no documented PFT's Hx Cocaine Abuse P:   HD for volume removal Oxygen to support sats 90-95% Pulmonary hygiene:  IS, mobilize as able Nicotine patch   CARDIOVASCULAR CVL A:  Hypertensive Emergency  P:  Tx to ICU Cardene gtt for MAP of 100 for first 6 hours PRN hydralazine for SBP > 180  Continue norvasc, clonidine, cozaar Continue Coreg @ reduced dose 12.5 mg BID Trend troponin   RENAL A:   ESRD - secondary to levamisole vasculitis, on baseline 10 mg prednisone, with HD T, Th, Sat Hyperkalemia  Levamisole Vasculitis  P:   HD per Nephrology  Continue Prednisone 10 mg QD  GASTROINTESTINAL A:   Protein Calorie Malnutrition  P:   Ensure BID  Renal Diet  HEMATOLOGIC A:   Anemia - of chronic disease  P:  Monitor H/H Tx for Hgb <7%, await follow up labs post HD to determine tx need for 9/8  INFECTIOUS A:   No acute infectious process  P:   Monitor fever curve, leukocytosis  Hold ABX  ENDOCRINE A:   Hyperglycemia  P:   Monitor glucose trend, consider addition of SSI if remains > 180 consistently   NEUROLOGIC A:  Headaches - without neurological deficit, negative exam  P:   RASS goal: n/a PRN oxycodone / fentanyl for pain  TODAY'S SUMMARY: 48 y/o M with polysubstance abuse admitted with SOB / pulmonary edema & profound hypertension after missing one week of HD and smoking cocaine.   Noe Gens, NP-C [facility] Pgr: [phone] or [phone]  Attending:  I have seen and examined the patient with nurse practitioner/resident and agree with the note above.   I have personally obtained a history, examined the patient, evaluated laboratory and imaging results, formulated the assessment and plan and placed orders.  CRITICAL CARE: The patient is critically ill with multiple organ systems failure and requires high complexity decision making for assessment and support, frequent evaluation and titration of therapies, application of advanced monitoring technologies and extensive interpretation of multiple databases. Critical Care Time devoted to patient care services described in this note is 45 minutes.    Roselie Awkward, MD [place] [facility] Pager: [phone] Cell: [phone] If no response, call [phone]  01/21/2014, 2:14 PM

## 2014-01-21 NOTE — Progress Notes (Signed)
ABG drawn, pt taken off Bipap and placed on 3L [place] with SATs 100%. Pt stated mask was hurting his face, MD advised to take off as he was going to dialysis. Pt in no distress at this time, RT will continue to monitor.

## 2014-01-21 NOTE — ED Notes (Signed)
MD with Nephrology still at bedside.

## 2014-01-21 NOTE — ED Notes (Signed)
Discussed plan of care with Dr. Claudine Mouton, patient's blood pressure is still elevated, no order to cover at this time. Patient does not complain of headache. Also spoke to [place], Respiratory Therapy, to report patient has new ABG order.

## 2014-01-21 NOTE — ED Notes (Signed)
EKG given to Dr. Claudine Mouton

## 2014-01-22 ENCOUNTER — Inpatient Hospital Stay (HOSPITAL_COMMUNITY): Payer: Medicare Other

## 2014-01-22 DIAGNOSIS — J810 Acute pulmonary edema: Secondary | ICD-10-CM

## 2014-01-22 DIAGNOSIS — I4891 Unspecified atrial fibrillation: Secondary | ICD-10-CM | POA: Diagnosis not present

## 2014-01-22 LAB — CBC
HCT: 24.2 % — ABNORMAL LOW (ref 39.0–52.0)
Hemoglobin: 8.2 g/dL — ABNORMAL LOW (ref 13.0–17.0)
MCH: 31.5 pg (ref 26.0–34.0)
MCHC: 33.9 g/dL (ref 30.0–36.0)
MCV: 93.1 fL (ref 78.0–100.0)
Platelets: 110 10*3/uL — ABNORMAL LOW (ref 150–400)
RBC: 2.6 MIL/uL — ABNORMAL LOW (ref 4.22–5.81)
RDW: 15.3 % (ref 11.5–15.5)
WBC: 4.5 10*3/uL (ref 4.0–10.5)

## 2014-01-22 LAB — TROPONIN I: Troponin I: <0.3 ng/mL (ref <0.30)

## 2014-01-22 LAB — COMPREHENSIVE METABOLIC PANEL
ALT: 9 U/L (ref 0–53)
ANION GAP: 20 — AB (ref 5–15)
AST: 9 U/L (ref 0–37)
Albumin: 3.4 g/dL — ABNORMAL LOW (ref 3.5–5.2)
Alkaline Phosphatase: 58 U/L (ref 39–117)
BILIRUBIN TOTAL: 0.4 mg/dL (ref 0.3–1.2)
BUN: 69 mg/dL — AB (ref 6–23)
CHLORIDE: 92 meq/L — AB (ref 96–112)
CO2: 25 mEq/L (ref 19–32)
CREATININE: 13.6 mg/dL — AB (ref 0.50–1.35)
Calcium: 9.4 mg/dL (ref 8.4–10.5)
GFR calc Af Amer: 4 mL/min — ABNORMAL LOW (ref >90)
GFR calc non Af Amer: 4 mL/min — ABNORMAL LOW (ref >90)
Glucose, Bld: 104 mg/dL — ABNORMAL HIGH (ref 70–99)
Potassium: 5.3 mEq/L (ref 3.7–5.3)
Sodium: 137 mEq/L (ref 137–147)
Total Protein: 6.9 g/dL (ref 6.0–8.3)

## 2014-01-22 LAB — PROTIME-INR
INR: 1.47 (ref 0.00–1.49)
Prothrombin Time: 17.8 seconds — ABNORMAL HIGH (ref 11.6–15.2)

## 2014-01-22 MED ORDER — ALTEPLASE 2 MG IJ SOLR
2.0000 mg | Freq: Once | INTRAMUSCULAR | Status: AC | PRN
  Filled 2014-01-22: qty 2

## 2014-01-22 MED ORDER — LIDOCAINE-PRILOCAINE 2.5-2.5 % EX CREA: 1.0000 "application " | TOPICAL_CREAM | CUTANEOUS | Status: DC | PRN

## 2014-01-22 MED ORDER — AMIODARONE HCL IN DEXTROSE 360-4.14 MG/200ML-% IV SOLN: 60.0000 mg/h | INTRAVENOUS | Status: DC

## 2014-01-22 MED ORDER — NEPRO/CARBSTEADY PO LIQD: 237.0000 mL | ORAL | Status: DC | PRN

## 2014-01-22 MED ORDER — SODIUM CHLORIDE 0.9 % IV SOLN: 100.0000 mL | INTRAVENOUS | Status: DC | PRN

## 2014-01-22 MED ORDER — HYDRALAZINE HCL 20 MG/ML IJ SOLN: 10.0000 mg | INTRAMUSCULAR | Status: DC | PRN

## 2014-01-22 MED ORDER — ASPIRIN 81 MG PO CHEW
81.0000 mg | CHEWABLE_TABLET | Freq: Every day | ORAL | Status: DC
  Administered 2014-01-22 – 2014-01-25 (×4): 81 mg via ORAL
  Filled 2014-01-22 (×4): qty 1

## 2014-01-22 MED ORDER — CARVEDILOL 25 MG PO TABS
25.0000 mg | ORAL_TABLET | Freq: Two times a day (BID) | ORAL | Status: DC
  Administered 2014-01-22 – 2014-01-24 (×4): 25 mg via ORAL
  Filled 2014-01-22 (×7): qty 1

## 2014-01-22 MED ORDER — METOPROLOL TARTRATE 1 MG/ML IV SOLN
2.5000 mg | INTRAVENOUS | Status: DC | PRN
  Administered 2014-01-22: 5 mg via INTRAVENOUS

## 2014-01-22 MED ORDER — PENTAFLUOROPROP-TETRAFLUOROETH EX AERO: 1.0000 "application " | INHALATION_SPRAY | CUTANEOUS | Status: DC | PRN

## 2014-01-22 MED ORDER — AMIODARONE LOAD VIA INFUSION
150.0000 mg | Freq: Once | INTRAVENOUS | Status: DC
  Filled 2014-01-22: qty 83.34

## 2014-01-22 MED ORDER — HEPARIN SODIUM (PORCINE) 1000 UNIT/ML DIALYSIS: 1000.0000 [IU] | INTRAMUSCULAR | Status: DC | PRN

## 2014-01-22 MED ORDER — AMIODARONE HCL IN DEXTROSE 360-4.14 MG/200ML-% IV SOLN: 30.0000 mg/h | INTRAVENOUS | Status: DC

## 2014-01-22 MED ORDER — METOPROLOL TARTRATE 1 MG/ML IV SOLN
INTRAVENOUS | Status: AC
  Administered 2014-01-22: 5 mg via INTRAVENOUS
  Filled 2014-01-22: qty 5

## 2014-01-22 MED ORDER — LIDOCAINE HCL (PF) 1 % IJ SOLN: 5.0000 mL | INTRAMUSCULAR | Status: DC | PRN

## 2014-01-22 NOTE — Progress Notes (Addendum)
No distress. No new complaints Developed brief AFRVR during HD 9/09. Resolved with IV metoprolol Weaned off nicardipine gtt with resumption of home meds and PRN hydralazine  Filed Vitals:   01/22/14 1500 01/22/14 1555 01/22/14 1600 01/22/14 1713  BP: 121/82  115/75 138/90  Pulse: 91  90 90  Temp:  97.9 F (36.6 C)  98 F (36.7 C)  TempSrc:  Oral  Oral  Resp: 26  21 19   Height:      Weight:      SpO2: 100%  96% 98%   NAD No JVD noted Chest clear anteriorly RRR NABS Ext warm without edema  I have reviewed all of today's lab results. Relevant abnormalities are discussed in the A/P section   CXR: CM, mild edema pattern   IMPRESSION: ESRD - noncompliance with HD Hypertensive emergency Pulm edema - improved Acute respiratory distress - resolved Severe hyperkalemia - resolved Cocaine abuse - ongoing PAF with RVR, transient during HD 9/09 Anemia without acute blood loss Thrombocytopenia  PLAN: Cont home antihypertensive regimen  Carvedilol, clonidine, amlodipine Cont PRN hydralazine to maintain SBP < 170 mmHg Echo for eval of AF Cycle cardiac markers HD per Renal Service Follow CXR intermittently Follow CBC Transfer to telemetry unit IMTS to assume care 9/10 AM and PCCM to sign off - discussed with Dr Heywood Iles, MD ; [facility] service Mobile [phone].  After 5:30 PM or weekends, call [phone]

## 2014-01-22 NOTE — Procedures (Signed)
I was present at this dialysis session. I have reviewed the session itself and made appropriate changes.   Remains HTN on nicardipine gtt, low dose.  Challenge EDW by 1kg today.  AVF.  Pt feels improved.  On [place].   Pearson Grippe  MD 01/22/2014, 9:00 AM

## 2014-01-22 NOTE — Progress Notes (Signed)
Report called to [employer], Therapist, sports.  Will continue to monitor.

## 2014-01-22 NOTE — Progress Notes (Signed)
Patient developed sinus tachycardia/ atrial fibrillation during hemodialysis.  Patient given 5 mg lopressor IV OTO per MD. Will continue to monitor.

## 2014-01-23 DIAGNOSIS — D696 Thrombocytopenia, unspecified: Secondary | ICD-10-CM

## 2014-01-23 DIAGNOSIS — I120 Hypertensive chronic kidney disease with stage 5 chronic kidney disease or end stage renal disease: Principal | ICD-10-CM

## 2014-01-23 DIAGNOSIS — E872 Acidosis, unspecified: Secondary | ICD-10-CM

## 2014-01-23 DIAGNOSIS — F17200 Nicotine dependence, unspecified, uncomplicated: Secondary | ICD-10-CM

## 2014-01-23 DIAGNOSIS — E875 Hyperkalemia: Secondary | ICD-10-CM

## 2014-01-23 DIAGNOSIS — K5900 Constipation, unspecified: Secondary | ICD-10-CM

## 2014-01-23 DIAGNOSIS — D649 Anemia, unspecified: Secondary | ICD-10-CM

## 2014-01-23 DIAGNOSIS — Z992 Dependence on renal dialysis: Secondary | ICD-10-CM

## 2014-01-23 DIAGNOSIS — G40909 Epilepsy, unspecified, not intractable, without status epilepticus: Secondary | ICD-10-CM

## 2014-01-23 DIAGNOSIS — I319 Disease of pericardium, unspecified: Secondary | ICD-10-CM

## 2014-01-23 LAB — BASIC METABOLIC PANEL
Anion gap: 22 — ABNORMAL HIGH (ref 5–15)
BUN: 63 mg/dL — AB (ref 6–23)
CHLORIDE: 88 meq/L — AB (ref 96–112)
CO2: 25 mEq/L (ref 19–32)
CREATININE: 11.08 mg/dL — AB (ref 0.50–1.35)
Calcium: 9.6 mg/dL (ref 8.4–10.5)
GFR calc non Af Amer: 5 mL/min — ABNORMAL LOW (ref >90)
GFR, EST AFRICAN AMERICAN: 6 mL/min — AB (ref >90)
Glucose, Bld: 106 mg/dL — ABNORMAL HIGH (ref 70–99)
Potassium: 4.9 mEq/L (ref 3.7–5.3)
Sodium: 135 mEq/L — ABNORMAL LOW (ref 137–147)

## 2014-01-23 LAB — CBC
HCT: 23.8 % — ABNORMAL LOW (ref 39.0–52.0)
HEMOGLOBIN: 8 g/dL — AB (ref 13.0–17.0)
MCH: 32.3 pg (ref 26.0–34.0)
MCHC: 33.6 g/dL (ref 30.0–36.0)
MCV: 96 fL (ref 78.0–100.0)
Platelets: 119 10*3/uL — ABNORMAL LOW (ref 150–400)
RBC: 2.48 MIL/uL — ABNORMAL LOW (ref 4.22–5.81)
RDW: 14.8 % (ref 11.5–15.5)
WBC: 5 10*3/uL (ref 4.0–10.5)

## 2014-01-23 LAB — MAGNESIUM: MAGNESIUM: 2.3 mg/dL (ref 1.5–2.5)

## 2014-01-23 MED ORDER — SENNOSIDES-DOCUSATE SODIUM 8.6-50 MG PO TABS
1.0000 | ORAL_TABLET | Freq: Every day | ORAL | Status: DC
Start: 2014-01-23 — End: 2014-01-25
  Administered 2014-01-23 – 2014-01-25 (×3): 1 via ORAL
  Filled 2014-01-23 (×3): qty 1

## 2014-01-23 NOTE — Progress Notes (Signed)
Internal Medicine Attending  Date: 01/23/2014  Patient name: Jeremy Mccoy Medical record number: [id number] Date of birth: 1965-11-14 Age: 48 y.o. Gender: male  I saw and evaluated the patient, and discussed his care with resident on A.M rounds.  I reviewed the resident's note by Dr. Eula Fried and I agree with the resident's findings and plans as documented in her note.

## 2014-01-23 NOTE — Progress Notes (Addendum)
Internal Medicine Teaching Service Transfer Note  History of Present Illness from admission note 01/21/14: This is a 48 year old gentleman, with past medical history of end-stage renal disease on hemodialysis TTS since July 2015, hypertension, cocaine and tobacco abuse, and medication non-compliance. He presents with worsening shortness of breath after missing dialysis for one week. He was attempting to go for his dialysis today, but he was unable and he was routed to the ED by EMS due to severe symptoms. He denies acute chest pain, fevers, chills, nausea or vomiting. His appetite has been good. He denies cough. His blood pressure was found to be severely elevated in the emergency department, and his O2 saturations, were in the 70s. Oxygen saturation improved with supplemental oxygen via BiPAP. Patient continues to report increasing shortness of breath and is providing history in breaking sentences.  Brief hospital course: 9/8: Admitted for acute hypoxic respiratory failure 2/2 pulmonary edema in setting of non-compliance with sessions of HD, substance abuse (cocaine), hyperkalemia (K 7.6), and hypertensive emergency. HD on admission and transferred to ICU for further management of hypertensive emergency. Started on Nicardipine gtt 9/9: HD again and noted to have brief AFRVR (per PCCM note) during HD that resolved with IV Metoprolol. Weaned of nicardipine gtt and restarted on home medications with PRN hydralazine. Persistent pulmonary edema with superimposed bibasilar infiltrates L>R on CXR. 9/10: out of ICU, tele bed, BP much improved, 130/85 this morning. CE x3 have been negative. HD likely again today per regular schedule (T,Th,Sat). Echo pending. (Last echo 50-55% EF, mild LVH, trivial pericardial effusion). Back on home BP medications including Losartan, Coreg 25mg  bid (although home records show 25mg  bid non HD days and 25mg  qd on HD days), and Clonidine 0.2mg  bid. Cr down to 11.08 from 26.52 on admission  K down to 4.9 from 7.6 on admission.  Today feeling much better since admission with no complaints of SOB or CP.  He reports his biggest reason for coming of HD earlier than the allotted time due to severe headaches.  He tolerated breakfast this morning with no complaints. No recent BM. Back in SR this morning.    Meds: Current Facility-Administered Medications  Medication Dose Route Frequency Provider Last Rate Last Dose  . 0.9 %  sodium chloride infusion  100 mL Intravenous PRN [name], MD      . 0.9 %  sodium chloride infusion  100 mL Intravenous PRN [name], MD      . 0.9 %  sodium chloride infusion  100 mL Intravenous PRN [name], MD      . 0.9 %  sodium chloride infusion  100 mL Intravenous PRN [name], MD      . amLODipine (NORVASC) tablet 10 mg  10 mg Oral Daily Jessee Avers, MD   10 mg at 01/22/14 1050  . antiseptic oral rinse (CPC / CETYLPYRIDINIUM CHLORIDE 0.05%) solution 7 mL  7 mL Mouth Rinse BID Juanito Doom, MD   7 mL at 01/22/14 2323  . aspirin chewable tablet 81 mg  81 mg Oral Daily Wilhelmina Mcardle, MD   81 mg at 01/22/14 1050  . carvedilol (COREG) tablet 25 mg  25 mg Oral BID WC Wilhelmina Mcardle, MD   25 mg at 01/22/14 2046  . cloNIDine (CATAPRES) tablet 0.2 mg  0.2 mg Oral BID Jessee Avers, MD   0.2 mg at 01/22/14 2322  . diphenhydrAMINE (BENADRYL) capsule 25 mg  25 mg Oral Q6H  PRN Axel Filler, MD      . feeding supplement (NEPRO CARB STEADY) liquid 237 mL  237 mL Oral PRN [name], MD      . fentaNYL (SUBLIMAZE) injection 25-50 mcg  25-50 mcg Intravenous Q2H PRN Donita Brooks, NP   50 mcg at 01/22/14 0257  . heparin injection 1,000 Units  1,000 Units Dialysis PRN [name], MD      . heparin injection 5,000 Units  5,000 Units Subcutaneous 3 times per day Jessee Avers, MD   5,000 Units at 01/23/14 [id number]  . hydrALAZINE (APRESOLINE) injection 10-40 mg  10-40 mg Intravenous Q4H PRN Wilhelmina Mcardle, MD      .  levETIRAcetam (KEPPRA) tablet 500 mg  500 mg Oral BID Jessee Avers, MD   500 mg at 01/22/14 2322  . lidocaine (PF) (XYLOCAINE) 1 % injection 5 mL  5 mL Intradermal PRN [name], MD      . lidocaine-prilocaine (EMLA) cream 1 application  1 application Topical PRN [name], MD      . losartan (COZAAR) tablet 100 mg  100 mg Oral QHS Jessee Avers, MD   100 mg at 01/22/14 2320  . metoprolol (LOPRESSOR) injection 2.5-5 mg  2.5-5 mg Intravenous Q3H PRN Wilhelmina Mcardle, MD   5 mg at 01/22/14 1033  . nicardipine (CARDENE) 20mg  in 0.86% saline 256ml IV infusion (0.1 mg/ml)  3-15 mg/hr Intravenous Continuous Wilhelmina Mcardle, MD   3 mg/hr at 01/22/14 0800  . nicotine (NICODERM CQ - dosed in mg/24 hours) patch 21 mg  21 mg Transdermal Daily Jessee Avers, MD   21 mg at 01/22/14 1054  . nitroGLYCERIN (NITROSTAT) SL tablet 0.4 mg  0.4 mg Sublingual Q5 min PRN Doree Fudge, MD      . pantoprazole (PROTONIX) EC tablet 40 mg  40 mg Oral Daily Jessee Avers, MD   40 mg at 01/22/14 1054  . pentafluoroprop-tetrafluoroeth (GEBAUERS) aerosol 1 application  1 application Topical PRN [name], MD      . predniSONE (DELTASONE) tablet 10 mg  10 mg Oral Q breakfast Jessee Avers, MD   10 mg at 01/22/14 [id number]   Allergies: Allergies as of 01/21/2014  . (No Known Allergies)   Past Medical History  Diagnosis Date  . COPD (chronic obstructive pulmonary disease)   . Crack cocaine use   . Dental caries   . HTN (hypertension) 06/12/2012  . Hematemesis/vomiting blood   . Shortness of breath     "just when I have too much potassium is too high" (05/15/2013)  . Anemia   . History of blood transfusion 10/2011; 04/2013  . Headache(784.0)     "get it from going to dialysis; when they get real bad I come to hospital" (05/15/2013)  . Arthritis     "qwhere" (05/15/2013)  . End stage renal disease 01/11/2012    Patient presented 11/04/11 to hospital with CP, wt loss and N/V. Creat was  10, admitted. 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 ANA was + 1:80, +MPO and +pr3 Ab's (ANCA). Received plasmapheresis x 7, IV cytoxan and pred taper. First HD was 11/17/11. 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 Pt d/c'd to do outpt HD and may have gotten 1-2 additional Cytoxan as OP, but this was stopped eventually since renal function didn't recover. Now gets HD TTS  schedule at [facility].  L forearm AVF 11/19/11 by Dr. Scot Dock is current access.    Marland Kitchen ESRD (end stage renal disease) on dialysis     TTS: [address] [place] (05/15/2013)  . ESRD (end stage renal disease) on dialysis   . Headache(784.0)   . Depression   . Anxiety    Past Surgical History  Procedure Laterality Date  . Av fistula placement  11/29/2011    Procedure: ARTERIOVENOUS (AV) FISTULA CREATION;  Surgeon: Angelia Mould, MD;  Location: [place];  Service: Vascular;  Laterality: Left;  . Renal biopsy  11/07/2011  . Inguinal hernia repair Bilateral 1967  . Esophagogastroduodenoscopy N/A 05/17/2013    Procedure: ESOPHAGOGASTRODUODENOSCOPY (EGD);  Surgeon: Beryle Beams, MD;  Location: [facility] ENDOSCOPY;  Service: Endoscopy;  Laterality: N/A;   Family History  Problem Relation Age of Onset  . Heart attack Father    History   Social History  . Marital Status: Single    Spouse Name: N/A    Number of Children: 0  . Years of Education: 9 th   Occupational History  . floor insulation     Disabled   Social History Main Topics  . Smoking status: Current Every Day Smoker -- 1.00 packs/day for 35 years    Types: Cigars, Cigarettes  . Smokeless tobacco: Never Used  . Alcohol Use: No     Comment: 05/15/2013 "aien't drank in ~ 25 yrs"  . Drug Use: Yes    Special: Marijuana, Cocaine     Comment: Precription drugs (e.g. percocet) "I take what I have to to controll my constant pain" (05/15/2013)   . Sexual Activity: No   Other Topics Concern  . Not on file   Social History Narrative   ** Merged History Encounter **   Patient lives at home with his parents and he is disabled.   Education 9th grade education   Right handed   Caffeine one cup daily       Review of Systems:  Constitutional:  Occasional chills but not currently  HEENT:  Denies trouble swallowing, neck pain   Respiratory:  Denies SOB. Intermittent cough  Cardiovascular:  Denies chest pain and leg swelling.   Gastrointestinal:  Denies nausea, vomiting, abdominal pain. Constipation  Genitourinary:  Anuric.   Musculoskeletal:  Denies gait problem  Skin:  Easy bruising  Neurological:  Denies recent seizures but has hx of seizure disorder. Headaches, especially with HD.    Physical Exam: Blood pressure 130/85, pulse 91, temperature 98.6 F (37 C), temperature source Oral, resp. rate 19, height 6\' 5"  (1.956 m), weight 161 lb 6 oz (73.2 kg), SpO2 92.00%. Vitals reviewed. General: resting in bed, NAD HEENT: EOMI, no scleral icterus Cardiac: RRR, ?+MR murmur Pulm: +mild rales lower bases L>R Abd: soft, nontender Ext: warm and well perfused, no pedal edema, LAVF with palpable thrill and bruit, +ecchymosis RLE and RUE, +2dp b/l, moving all 4 extremities  Neuro: alert and oriented X3  Lab results: Basic Metabolic Panel:  Recent Labs  01/21/14 1605 01/22/14 0225  NA 138 137  K 4.1 5.3  CL 91* 92*  CO2 21 25  GLUCOSE 97 104*  BUN 60* 69*  CREATININE 11.58* 13.60*  CALCIUM 10.2 9.4   Liver Function Tests:  Recent Labs  01/21/14 0523 01/22/14 0225  AST 15 9  ALT 13 9  ALKPHOS 70 58  BILITOT 0.4 0.4  PROT 7.4 6.9  ALBUMIN 4.1 3.4*    Recent Labs  01/21/14  [id number]  LIPASE 24   CBC:  Recent Labs  01/21/14 0523  01/22/14 0225 01/23/14 0536  WBC 5.0  --  4.5 5.0  NEUTROABS 3.7  --   --   --   HGB 7.2*  < > 8.2* 8.0*  HCT 21.1*  < > 24.2* 23.8*  MCV 93.8  --  93.1 96.0  PLT 120*  --  110*  119*  < > = values in this interval not displayed. Cardiac Enzymes:  Recent Labs  01/22/14 1210 01/22/14 1600 01/22/14 2201  TROPONINI <0.30 <0.30 <0.30   CBG:  Recent Labs  01/21/14 0506 01/21/14 1517  GLUCAP 94 93   Coagulation:  Recent Labs  01/22/14 0225  LABPROT 17.8*  INR 1.47   Urine Drug Screen: Drugs of Abuse     Component Value Date/Time   LABOPIA NEGATIVE 11/05/2011 1441   COCAINSCRNUR POSITIVE* 11/05/2011 1441   LABBENZ NEGATIVE 11/05/2011 1441   AMPHETMU NEGATIVE 11/05/2011 1441    Imaging results:  Dg Chest Port 1 View  01/22/2014   CLINICAL DATA:  Shortness of breath  EXAM: PORTABLE CHEST - 1 VIEW  COMPARISON:  01/21/2014  FINDINGS: Cardiac shadow remains enlarged. Increasing left retrocardiac infiltrate and right basilar infiltrate is noted. Persistent opacities are noted consistent with pulmonary edema. No pneumothorax is seen.  IMPRESSION: Persistent changes of pulmonary edema with superimposed bibasilar infiltrates left greater than right.   Electronically Signed   By: Inez Catalina M.D.   On: 01/22/2014 07:49   Other results: EKG 01/23/14: 158bpm, Afib RVR, ST depressions anterolateral leads (I,V5,V6, and TWI aVL), changed from admission.  EKG 9/11: 80bpm, NSR, TWI AVL stil present but ST depressions now resolved. ?L axis deviation, likely LVH  Assessment & Plan by Problem: Principal Problem:   Malignant hypertension Active Problems:   Tobacco abuse   Cocaine abuse   Pulmonary edema   Abnormal EKG   Anemia in chronic kidney disease   Acute respiratory failure   Hyperkalemia   Seizure disorder   ESRD on hemodialysis   High anion gap metabolic acidosis   Atrial fibrillation with RVR   Thrombocytopenia, unspecified   Unspecified constipation Hypertensive emergency with acute hypoxic respiratory failure in the setting of pulmonary edema 2/2 non compliance with HD--now resolved after nicardipine gtt and HD sessions.  BP improved to 130/85 this  morning. S/p 2HD sessions since admission. -monitor on tele -continue home medications (norvasc, coreg, clonidine, and losartan)--would consider adjusting coreg dose to home regimen (25mg  bid on non-HD days and QD on HD days) if HR improved -resume HD per regular schedule (T,Th,Sat) -check pulse ox with ambulation -consider repeat CXR 2view (?superimposed infiltrates although less suspicious of PNA given clinical presentation, no leukocytosis, and afebrile thus far) if develops respiratory distress -repeat echo ordered, although last one was in June of this year with trivial pericardial effusion and EF 50-55%; consider if still needed -consider PFTs as outpatient--?hx of COPD, current smoker and cocaine abuse -he is on chronic prednisone 10mg  daily (will have day team investigate reason and consider tapering off if not needed)  ESRD on HD (T,Th,Sat); non-compliant.  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 -Appreciate nephrology following; likely resume HD per regular schedule -Stressed the importance of compliance with HD sessions -AM renal function panel -check HIV in AM  Afib RVR--apparently during HD 9/9 that resolved with IV Metoprolol. Likely in setting of volume overload/pulmonary edema.  Sinus tachycardia on admission.  CE x3 negative -repeat EKG in AM  Anion gap metabolic acidosis: In the setting of ESRD. AG 31 on admission down to 22.   -continue to monitor  Chronic normocytic anemia and thrombocytopenia--Baseline hemoglobin ~ 7-8.  Hb 7.2 on admission, improved to 8.0 today.   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 On Keppra 500 mg twice daily at home.  Plan.  -Continue home dose keppra -Control blood  pressure  Substance abuse--noted current cocaine and tobacco use. Endorsed recent cocaine use 4 days prior to admission. He has also apparently been in outpatient rehabilitation several times without success.  -Counseled on cessation -Nicotine patch  -He is considering to check himself into another facility for rehab and has been encouraged by [place] to do so. He reports having the information and we will try to have social work provide with him more information if possible.  Constipation--no recent BM -will start senna for now  Diet: Renal DVT Ppx: SCDs (thrombocytopenia) Dispo: Disposition is deferred at this time, awaiting improvement of current medical problems. Anticipated discharge in approximately 1-2 day(s).   The patient does have a current PCP Leamon Arnt, MD) and does not need an [facility] hospital follow-up appointment after discharge.  The patient does not know have transportation limitations that hinder transportation to clinic appointments.  Signed: Wilber Oliphant, MD 01/23/2014, 7:20 AM

## 2014-01-23 NOTE — Progress Notes (Signed)
SATURATION QUALIFICATIONS: (This note is used to comply with regulatory documentation for home oxygen)  Patient Saturations on Room Air at Rest = 100%  Patient Saturations on Room Air while Ambulating = 94%  Please briefly explain why patient needs home oxygen: n/a  Jillyn Ledger, MBA, BS, RN

## 2014-01-23 NOTE — Progress Notes (Signed)
[facility] Progress Note   Subjective: No more HA's , did not have a HA on HD yesterday, BP's were much lower on HD yest than on the day of admission  Filed Vitals:   01/22/14 1713 01/22/14 2125 01/23/14 0531 01/23/14 0933  BP: 138/90 132/77 130/85 125/68  Pulse: 90 94 91 87  Temp: 98 F (36.7 C) 98.8 F (37.1 C) 98.6 F (37 C) 98.7 F (37.1 C)  TempSrc: Oral Oral Oral Oral  Resp: 19 18 19 18   Height:      Weight:      SpO2: 98% 100% 92% 93%   Exam: Alert, no distress Chest clear bilat RRR no MRG Abd soft, NTND, no ascites No LE edema Neuro is ox3, nf LFA AVF patent  HD: TTS [place]  4h 36min 450/800 1K/2.5 Bath 71.5kg LFA AVF Heparin none  Hectorol 3 TIW, Aranesp 120 ug q Tues        Assessment: 1 Acute resp failure / pulm edema / HTN crisis / cocaine abuse- resolved 2 ESRD on HD 3 HTN much better controlled on home meds 4 Headache- this is a chronic OP problem and only assoc with HD, not sure the cause but happens late in HD after about 2 hours. Questioned pt and other than photophobia did not have other migraine characteristics.  Don't know if cocaine use would cause this.  Consider prophylactic attempt with topamax or some similar regimen (amitriptyline, etc). 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 I checked with OP HD and prednisone is not on his home med list 6 HPTH cont vit D, binders 7 Anemia cont aranesp 8 Dispo - prob home tomorrow per primary team if remains stable 9 Substance abuse- pt talking about getting Rx  Plan- Short HD today to get back on schedule    Kelly Splinter MD  pager [phone]    cell [phone]  01/23/2014, 1:03 PM     Recent Labs Lab 01/21/14 1605 01/22/14 0225 01/23/14 0536  NA 138 137 135*  K 4.1 5.3 4.9  CL 91* 92* 88*  CO2 21 25 25   GLUCOSE 97 104* 106*  BUN 60* 69* 63*  CREATININE 11.58* 13.60* 11.08*   CALCIUM 10.2 9.4 9.6    Recent Labs Lab 01/21/14 0523 01/22/14 0225  AST 15 9  ALT 13 9  ALKPHOS 70 58  BILITOT 0.4 0.4  PROT 7.4 6.9  ALBUMIN 4.1 3.4*    Recent Labs Lab 01/21/14 0523  01/21/14 0735 01/22/14 0225 01/23/14 0536  WBC 5.0  --   --  4.5 5.0  NEUTROABS 3.7  --   --   --   --   HGB 7.2*  < > 6.8* 8.2* 8.0*  HCT 21.1*  < > 20.0* 24.2* 23.8*  MCV 93.8  --   --  93.1 96.0  PLT 120*  --   --  110* 119*  < > = values in this interval not displayed. Marland Kitchen amLODipine  10 mg Oral Daily  . antiseptic oral rinse  7 mL Mouth Rinse BID  . aspirin  81 mg Oral Daily  . carvedilol  25 mg Oral BID WC  . cloNIDine  0.2 mg Oral BID  . levETIRAcetam  500 mg Oral BID  . losartan  100 mg Oral QHS  . nicotine  21 mg Transdermal Daily  . pantoprazole  40  mg Oral Daily  . predniSONE  10 mg Oral Q breakfast  . senna-docusate  1 tablet Oral Daily     diphenhydrAMINE, fentaNYL, lidocaine (PF), lidocaine-prilocaine, metoprolol, nitroGLYCERIN

## 2014-01-23 NOTE — Progress Notes (Signed)
  Echocardiogram 2D Echocardiogram has been performed.  Diamond Nickel 01/23/2014, 12:31 PM

## 2014-01-23 NOTE — Progress Notes (Signed)
Hemodialysis Tx complete. Pt getting blood rinsed back. Pt went into A-fib  With a rate of 130-140. Sustained for approximately 5 min then converted back to sinus rhythm without Tx.

## 2014-01-24 ENCOUNTER — Inpatient Hospital Stay (HOSPITAL_COMMUNITY): Payer: Medicare Other

## 2014-01-24 DIAGNOSIS — J811 Chronic pulmonary edema: Secondary | ICD-10-CM

## 2014-01-24 DIAGNOSIS — I10 Essential (primary) hypertension: Secondary | ICD-10-CM

## 2014-01-24 LAB — RENAL FUNCTION PANEL
ANION GAP: 19 — AB (ref 5–15)
Albumin: 3.5 g/dL (ref 3.5–5.2)
BUN: 57 mg/dL — ABNORMAL HIGH (ref 6–23)
CALCIUM: 9.7 mg/dL (ref 8.4–10.5)
CO2: 26 mEq/L (ref 19–32)
Chloride: 92 mEq/L — ABNORMAL LOW (ref 96–112)
Creatinine, Ser: 9.49 mg/dL — ABNORMAL HIGH (ref 0.50–1.35)
GFR calc Af Amer: 7 mL/min — ABNORMAL LOW (ref >90)
GFR calc non Af Amer: 6 mL/min — ABNORMAL LOW (ref >90)
GLUCOSE: 124 mg/dL — AB (ref 70–99)
POTASSIUM: 4.2 meq/L (ref 3.7–5.3)
Phosphorus: 6.4 mg/dL — ABNORMAL HIGH (ref 2.3–4.6)
Sodium: 137 mEq/L (ref 137–147)

## 2014-01-24 LAB — MAGNESIUM: Magnesium: 2.3 mg/dL (ref 1.5–2.5)

## 2014-01-24 LAB — HIV ANTIBODY (ROUTINE TESTING W REFLEX): HIV 1&2 Ab, 4th Generation: NONREACTIVE

## 2014-01-24 LAB — CBC
HCT: 22.5 % — ABNORMAL LOW (ref 39.0–52.0)
Hemoglobin: 7.4 g/dL — ABNORMAL LOW (ref 13.0–17.0)
MCH: 30.8 pg (ref 26.0–34.0)
MCHC: 32.9 g/dL (ref 30.0–36.0)
MCV: 93.8 fL (ref 78.0–100.0)
Platelets: 158 10*3/uL (ref 150–400)
RBC: 2.4 MIL/uL — AB (ref 4.22–5.81)
RDW: 14.7 % (ref 11.5–15.5)
WBC: 3.8 10*3/uL — AB (ref 4.0–10.5)

## 2014-01-24 MED ORDER — PENTAFLUOROPROP-TETRAFLUOROETH EX AERO: 1.0000 "application " | INHALATION_SPRAY | CUTANEOUS | Status: DC | PRN

## 2014-01-24 MED ORDER — SODIUM CHLORIDE 0.9 % IV SOLN: 100.0000 mL | INTRAVENOUS | Status: DC | PRN

## 2014-01-24 MED ORDER — LIDOCAINE-PRILOCAINE 2.5-2.5 % EX CREA: 1.0000 "application " | TOPICAL_CREAM | CUTANEOUS | Status: DC | PRN

## 2014-01-24 MED ORDER — NEPRO/CARBSTEADY PO LIQD: 237.0000 mL | ORAL | Status: DC | PRN

## 2014-01-24 MED ORDER — LIDOCAINE HCL (PF) 1 % IJ SOLN: 5.0000 mL | INTRAMUSCULAR | Status: DC | PRN

## 2014-01-24 MED ORDER — RENA-VITE PO TABS
1.0000 | ORAL_TABLET | Freq: Every day | ORAL | Status: DC
  Administered 2014-01-24: 1 via ORAL
  Filled 2014-01-24: qty 1

## 2014-01-24 MED ORDER — DILTIAZEM HCL ER COATED BEADS 240 MG PO CP24
240.0000 mg | ORAL_CAPSULE | Freq: Every day | ORAL | Status: DC
  Administered 2014-01-25: 240 mg via ORAL
  Filled 2014-01-24: qty 1

## 2014-01-24 MED ORDER — ALTEPLASE 2 MG IJ SOLR: 2.0000 mg | Freq: Once | INTRAMUSCULAR | Status: AC | PRN

## 2014-01-24 MED ORDER — HEPARIN SODIUM (PORCINE) 1000 UNIT/ML DIALYSIS: 1000.0000 [IU] | INTRAMUSCULAR | Status: DC | PRN

## 2014-01-24 NOTE — Progress Notes (Signed)
CARE MANAGEMENT NOTE 01/24/2014  Patient:  Jeremy Mccoy, Jeremy Mccoy   Account Number:  [id number]  Date Initiated:  01/22/2014  Documentation initiated by:  [facility]  Subjective/Objective Assessment:   Admitted with SOB after missing dialysis sessions. found to be in HTN crisis - tx to ICU for nicardipine drip.     Action/Plan:   Anticipated DC Date:  01/25/2014   Anticipated DC Plan:  [place]  CM consult      Choice offered to / List presented to:             Status of service:  Completed, signed off Medicare Important Message given?  YES (If response is "NO", the following Medicare IM given date fields will be blank) Date Medicare IM given:  01/23/2014 Medicare IM given by:  [facility] Date Additional Medicare IM given:   Additional Medicare IM given by:    Discharge Disposition:  HOME/SELF CARE  Per UR Regulation:  Reviewed for med. necessity/level of care/duration of stay  If discussed at [place] of Stay Meetings, dates discussed:    Comments:  01/24/2014 1600 NCM spoke to pt and states he had a ride to his dialysis treatments. Pt states his noncompliance is due to chronic headaches. He states he has discussed with physician. NCM explained the importance of compliance with dialysis treatments and notifying his physician of medical issues or problems. States he is able to purchase his medications. Pt reports he had Medicaid, but has not completed his recertification. Pt states he will follow up with getting his Medicaid recertified. Jonnie Finner RN CCM Case Mgmt phone [phone]   Contact:  Mode,Pat Mother [phone]   [phone]                 [name] Sister     [phone]                 Mode,Pat Mother [phone]                 [name] Sister [phone]

## 2014-01-24 NOTE — Consult Note (Signed)
Admit date: 01/21/2014 Referring Physician  Dr. Marinda Elk Primary Physician  None Primary Cardiologist  None Reason for Consultation  Afib with RVR  HPI: This is a 48yo male with a history of ESRD on HD, HTN, COPD, anemia, cocaine abuse who missed 3 HD sessions this week and developed severe DOE and called EMS upon awakening this am with severe SOB.  When EMS arrived his O2 sat was 70% on RA and improved in ER to 100% on BiPAP.  He has been having orthopnea.  In ER he was markedly hypertensive with BP 207/146mmHg.  He was in acute hypoxic respiratory failure with moderate diffuse pulmonary edema on chest xray secondary to volume overload from missed HD.  He was emergently dialyzed for CHF as well as hyperkalemia with peaking of the T waves.  Since admission he has had several episodes of afib with RVR on HD.  The first episode occurred on 9/9 and resovled with IV Lopressor.  This occurred again on 9/10.  His CHADS2VASC score is 1.  Cardiology is now asked to consult for further evaluation.     PMH:   Past Medical History  Diagnosis Date  . COPD (chronic obstructive pulmonary disease)   . Crack cocaine use   . Dental caries   . HTN (hypertension) 06/12/2012  . Hematemesis/vomiting blood   . Shortness of breath     "just when I have too much potassium is too high" (05/15/2013)  . Anemia   . History of blood transfusion 10/2011; 04/2013  . Headache(784.0)     "get it from going to dialysis; when they get real bad I come to hospital" (05/15/2013)  . Arthritis     "qwhere" (05/15/2013)  . End stage renal disease 01/11/2012    Patient presented 11/04/11 to hospital with CP, wt loss and N/V. Creat was 10, admitted. 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 ANA was + 1:80, +MPO and +pr3 Ab's (ANCA). Received plasmapheresis x 7, IV cytoxan and pred taper. First HD was 11/17/11. 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 Pt d/c'd to do outpt HD and may have gotten 1-2 additional Cytoxan as OP, but this was stopped eventually since renal function didn't recover. Now gets HD TTS schedule at [facility].  L forearm AVF 11/19/11 by Dr. Scot Dock is current access.    Marland Kitchen ESRD (end stage renal disease) on dialysis     TTS: [address] [place] (05/15/2013)  . ESRD (end stage renal disease) on dialysis   . Headache(784.0)   . Depression   . Anxiety      PSH:   Past Surgical History  Procedure Laterality Date  . Av fistula placement  11/29/2011    Procedure: ARTERIOVENOUS (AV) FISTULA CREATION;  Surgeon: Angelia Mould, MD;  Location: [facility];  Service: Vascular;  Laterality: Left;  . Renal biopsy  11/07/2011  . Inguinal hernia repair Bilateral 1967  . Esophagogastroduodenoscopy N/A 05/17/2013    Procedure: ESOPHAGOGASTRODUODENOSCOPY (EGD);  Surgeon: Beryle Beams, MD;  Location: [facility] ENDOSCOPY;  Service: Endoscopy;  Laterality: N/A;    Allergies:  Review of patient's allergies indicates no known allergies. Prior to Admit Meds:   Prescriptions prior to admission  Medication Sig Dispense Refill  . acetaminophen (TYLENOL) 500 MG tablet Take 1,000 mg by mouth every 6 (six) hours as needed for mild pain.       Marland Kitchen  amLODipine (NORVASC) 10 MG tablet Take 10 mg by mouth daily.      Marland Kitchen b complex vitamins tablet Take 1 tablet by mouth daily.      . carvedilol (COREG) 25 MG tablet Take 1 tablet (25 mg total) by mouth 2 (two) times daily with a meal. On Monday-Wednesday-Friday-Sunday (non-HD days); on dialysis days (Tuesday-Thursday-Saturday) take it once a day (evening time only).  60 tablet  1  . cloNIDine (CATAPRES) 0.2 MG tablet Take 1 tablet (0.2 mg total) by mouth 2 (two) times daily.  60 tablet  2  . diphenhydrAMINE (BENADRYL) 25 MG tablet Take 25 mg by mouth every 6 (six) hours as needed for itching.      . famotidine (PEPCID) 20 MG tablet Take 20 mg by mouth daily.      Marland Kitchen  guaiFENesin-dextromethorphan (ROBITUSSIN DM) 100-10 MG/5ML syrup Take 5 mLs by mouth every 4 (four) hours as needed for cough.  118 mL  0  . hydrOXYzine (ATARAX/VISTARIL) 25 MG tablet Take 25 mg by mouth 3 (three) times daily. itching      . levETIRAcetam (KEPPRA) 500 MG tablet Take 1 tablet (500 mg total) by mouth 2 (two) times daily.  60 tablet  3  . losartan (COZAAR) 100 MG tablet Take 100 mg by mouth at bedtime.      . multivitamin (RENA-VIT) TABS tablet Take 1 tablet by mouth at bedtime.      . nicotine (NICODERM CQ - DOSED IN MG/24 HOURS) 21 mg/24hr patch Place 1 patch (21 mg total) onto the skin daily.  28 patch  0  . Nutritional Supplements (FEEDING SUPPLEMENT, NEPRO CARB STEADY,) LIQD Take 237 mLs by mouth 3 (three) times daily as needed (Supplement).    0  . pantoprazole (PROTONIX) 40 MG tablet Take 40 mg by mouth daily.      . predniSONE (DELTASONE) 10 MG tablet Take 10 mg by mouth daily with breakfast.      . sevelamer carbonate (RENVELA) 800 MG tablet Take 1,600 mg by mouth 3 (three) times daily with meals.       . VELPHORO 500 MG chewable tablet Chew 1,000 mg by mouth 3 (three) times daily with meals.        Fam HX:    Family History  Problem Relation Age of Onset  . Heart attack Father    Social HX:    History   Social History  . Marital Status: Single    Spouse Name: N/A    Number of Children: 0  . Years of Education: 9 th   Occupational History  . floor insulation     Disabled   Social History Main Topics  . Smoking status: Current Every Day Smoker -- 1.00 packs/day for 35 years    Types: Cigars, Cigarettes  . Smokeless tobacco: Never Used  . Alcohol Use: No     Comment: 05/15/2013 "aien't drank in ~ 25 yrs"  . Drug Use: Yes    Special: Marijuana, Cocaine     Comment: Precription drugs (e.g. percocet) "I take what I have to to controll my constant pain" (05/15/2013)  . Sexual Activity: No   Other Topics Concern  . Not on file   Social History Narrative    ** Merged History Encounter **   Patient lives at home with his parents and he is disabled.   Education 9th grade education   Right handed   Caffeine one cup daily  ROS:  All 11 ROS were addressed and are negative except what is stated in the HPI  Physical Exam: Blood pressure 142/84, pulse 90, temperature 98.8 F (37.1 C), temperature source Oral, resp. rate 20, height 6\' 5"  (1.956 m), weight 158 lb 15.2 oz (72.1 kg), SpO2 98.00%.    General: Well developed, well nourished, in no acute distress Head: Eyes PERRLA, No xanthomas.   Normal cephalic and atramatic  Lungs:   Clear bilaterally to auscultation and percussion. Heart:   HRRR S1 S2 Pulses are 2+ & equal.            No carotid bruit. No JVD.  No abdominal bruits. No femoral bruits. Abdomen: Bowel sounds are positive, abdomen soft and non-tender without masses Extremities:   No clubbing, cyanosis or edema.  DP +1 Neuro: Alert and oriented X 3. Psych:  Good affect, responds appropriately    Labs:   Lab Results  Component Value Date   WBC 5.0 01/23/2014   HGB 8.0* 01/23/2014   HCT 23.8* 01/23/2014   MCV 96.0 01/23/2014   PLT 119* 01/23/2014    Recent Labs Lab 01/22/14 0225 01/23/14 0536  NA 137 135*  K 5.3 4.9  CL 92* 88*  CO2 25 25  BUN 69* 63*  CREATININE 13.60* 11.08*  CALCIUM 9.4 9.6  PROT 6.9  --   BILITOT 0.4  --   ALKPHOS 58  --   ALT 9  --   AST 9  --   GLUCOSE 104* 106*   Lab Results  Component Value Date   PTT 48* 11/05/2011   Lab Results  Component Value Date   INR 1.47 01/22/2014   INR 1.12 07/10/2013   INR 1.26 07/10/2013   Lab Results  Component Value Date   CKTOTAL 47 11/05/2011   CKMB 1.7 11/05/2011   TROPONINI <0.30 01/22/2014     No results found for this basename: CHOL   No results found for this basename: HDL   No results found for this basename: LDLCALC   No results found for this basename: TRIG   No results found for this basename: CHOLHDL   No results found for this  basename: LDLDIRECT      Radiology:  Dg Chest 2 View  01/24/2014   CLINICAL DATA:  Respiratory failure  EXAM: CHEST  2 VIEW  COMPARISON:  January 22, 2014  FINDINGS: There has been interval clearing of interstitial edema. There is atelectatic change in the right and left lung bases. Elsewhere, the lungs are clear. The heart is mildly enlarged with pulmonary vascularity within normal limits. There is moderate peribronchial thickening.  No adenopathy.  No bone lesions.  IMPRESSION: Currently no edema or consolidation. There is central interstitial thickening consistent with a degree of bronchitis. There is patchy bibasilar atelectatic change. Heart is mildly enlarged.   Electronically Signed   By: Lowella Grip M.D.   On: 01/24/2014 08:48    EKG:  NSR with LAE  ASSESSMENT:  1.  PAF with RVR mainly in the setting of HD.  He has noticed some palpitations recently.  Currently in NSR.  2D echo showed low normal LVF EF 50-55% with LAE and trivial posterior pericardial effusion. 2.  ESRD on HD 3.  Hypertensive emergency due to missed HD - BP now improved 4.  Acute respiratory failure secondary to volume overload - now resolved with HD 5.  Anemia  PLAN:   1.  Would avoid beta blockers with history of cocaine abuse 2.  Change Amlodipine to Cardizem CD 240mg  daily for suppression of afib and titrate as needed for BP control as HR tolerates 3. His CHADS2VASC is only 1 (HTN).  He had CHF on admission but this was in the setting of volume overload after missing 3 HD sessions so I would not include CHF as a risk factor.   He can be treated with ASA.  Given compliance issues and low CHADS2VASC I do not think that coumadin is warranted at this time.Reubin Milan use NOAC in setting of ESRD.   Sueanne Margarita, MD  01/24/2014  11:49 AM

## 2014-01-24 NOTE — Progress Notes (Signed)
Please see my note for details.  Clinton Gallant, MD PGY-3

## 2014-01-24 NOTE — Progress Notes (Signed)
Internal Medicine Attending  Date: 01/24/2014  Patient name: Jeremy Mccoy Medical record number: [id number] Date of birth: Dec 05, 1965 Age: 48 y.o. Gender: male  I saw and evaluated the patient. I discussed patient and reviewed the resident's note by Dr. Algis Liming, and I agree with the resident's findings and plans as documented in her note, with the following additional comments.  Plan is to stop amlodipine and start diltiazem CD for rate control given paroxysmal atrial fibrillation as advised by Dr. Radford Pax.

## 2014-01-24 NOTE — Progress Notes (Signed)
Subjective:   No headache on HD but episode of afib. Wants to go home  Objective Filed Vitals:   01/24/14 0019 01/24/14 0610 01/24/14 1000 01/24/14 1110  BP: 163/98 141/90 118/110 142/84  Pulse: 99 96 87 90  Temp: 98.9 F (37.2 C) 98.5 F (36.9 C) 98.8 F (37.1 C)   TempSrc: Oral Oral Oral   Resp: 18 20 20    Height:      Weight:      SpO2: 95% 97% 98%    Physical Exam General: alert and oriented, no acute distress.  Heart: RRR. No murmur Lungs: CTA, unlabored  Abdomen: soft, nontender +BS Extremities: no edema  Dialysis Access:  L: AVF + b/t  HD: TTS [place] [facility]  4h 67min 450/800 1K/2.5 [place] 71.5kg LFA AVF Heparin none  Hectorol 3 TIW, Aranesp 120 ug q Tues   Assessment/Plan: 1. Actue resp failure- r/t cocaine. Resolved with HD-no edema on repeat chest xray 2. ESRD - TTS @ [facility]. HD tomorrow. K+4.9 3. Anemia -  hgb 8, cont ESA tuesday 4. Secondary hyperparathyroidism - CA+ 9.6, on hectorol 5. HTN/volume - 142/84, cont norvasc, coreg, clonidine, cozaar, metop. Close to edw 6. Nutrition - renal diet. Vit. nepro 7. Headache- none on HD last night 8. dispo- likely DC today  Shelle Iron, NP [place] [phone] 01/24/2014,12:41 PM  LOS: 3 days   Pt seen, examined, agree w assess/plan as above with additions as indicated. Stable for dc from renal standpoint.  Kelly Splinter MD pager [phone]    cell [phone] 01/24/2014, 1:20 PM      Additional Objective Labs: Basic Metabolic Panel:  Recent Labs Lab 01/21/14 1605 01/22/14 0225 01/23/14 0536  NA 138 137 135*  K 4.1 5.3 4.9  CL 91* 92* 88*  CO2 21 25 25   GLUCOSE 97 104* 106*  BUN 60* 69* 63*  CREATININE 11.58* 13.60* 11.08*  CALCIUM 10.2 9.4 9.6   Liver Function Tests:  Recent Labs Lab 01/21/14 0523 01/22/14 0225  AST 15 9  ALT 13 9  ALKPHOS 70 58  BILITOT 0.4 0.4  PROT 7.4 6.9  ALBUMIN 4.1 3.4*    Recent Labs Lab 01/21/14 0523  LIPASE 24   CBC:  Recent Labs Lab  01/21/14 0523  01/21/14 0735 01/22/14 0225 01/23/14 0536  WBC 5.0  --   --  4.5 5.0  NEUTROABS 3.7  --   --   --   --   HGB 7.2*  < > 6.8* 8.2* 8.0*  HCT 21.1*  < > 20.0* 24.2* 23.8*  MCV 93.8  --   --  93.1 96.0  PLT 120*  --   --  110* 119*  < > = values in this interval not displayed. Blood Culture    Component Value Date/Time   SDES ENDOTRACHEAL 07/10/2013 1344   SPECREQUEST TRACHEAL ASPIRATE 07/10/2013 1344   CULT  Value: ABUNDANT KLEBSIELLA OXYTOCA Performed at [facility] 07/10/2013 1344   REPTSTATUS 07/13/2013 FINAL 07/10/2013 1344    Cardiac Enzymes:  Recent Labs Lab 01/21/14 1605 01/21/14 2033 01/22/14 1210 01/22/14 1600 01/22/14 2201  TROPONINI <0.30 <0.30 <0.30 <0.30 <0.30   CBG:  Recent Labs Lab 01/21/14 0506 01/21/14 1517  GLUCAP 94 93   Iron Studies: No results found for this basename: IRON, TIBC, TRANSFERRIN, FERRITIN,  in the last 72 hours @lablastinr3 @ Studies/Results: Dg Chest 2 View  01/24/2014   CLINICAL DATA:  Respiratory failure  EXAM: CHEST  2 VIEW  COMPARISON:  January 22, 2014  FINDINGS: There has been interval clearing of interstitial edema. There is atelectatic change in the right and left lung bases. Elsewhere, the lungs are clear. The heart is mildly enlarged with pulmonary vascularity within normal limits. There is moderate peribronchial thickening.  No adenopathy.  No bone lesions.  IMPRESSION: Currently no edema or consolidation. There is central interstitial thickening consistent with a degree of bronchitis. There is patchy bibasilar atelectatic change. Heart is mildly enlarged.   Electronically Signed   By: Lowella Grip M.D.   On: 01/24/2014 08:48   Medications:   . amLODipine  10 mg Oral Daily  . antiseptic oral rinse  7 mL Mouth Rinse BID  . aspirin  81 mg Oral Daily  . carvedilol  25 mg Oral BID WC  . cloNIDine  0.2 mg Oral BID  . levETIRAcetam  500 mg Oral BID  . losartan  100 mg Oral QHS  . nicotine  21 mg  Transdermal Daily  . pantoprazole  40 mg Oral Daily  . senna-docusate  1 tablet Oral Daily

## 2014-01-24 NOTE — Progress Notes (Signed)
Subjective: Pt had another event of sustained afib with RVR (HR in 130-140s) for about 5 min during HD session. This has been happening throughout hospitalization. Pt asymptomatic during episodes and tele review this AM reveal NSR. Pt reminiscing about what prevented him from attending HD sessions in the past. Most being his HA that are accompanied by his HD sessions. He states the HA were debilitating to the point that he couldn't function and both light and noise would bother him. He was trying valium on the street and cocaine that both didn't help his HA and didn't exacerbate them at all. He had at one point had percocet which helped but he didn't like 2/2 constipation and would not like to restart.    Objective: Vital signs in last 24 hours: Filed Vitals:   01/23/14 2315 01/23/14 2320 01/24/14 0019 01/24/14 0610  BP: 154/99 157/89 163/98 141/90  Pulse: 138 99 99 96  Temp: 98.7 F (37.1 C)  98.9 F (37.2 C) 98.5 F (36.9 C)  TempSrc: Oral  Oral Oral  Resp: 18 20 18 20   Height:      Weight: 158 lb 15.2 oz (72.1 kg)     SpO2: 95%  95% 97%   Weight change:   Intake/Output Summary (Last 24 hours) at 01/24/14 0938 Last data filed at 01/24/14 [id number]  Gross per 24 hour  Intake    840 ml  Output   2650 ml  Net  -1810 ml   General: resting in bed HEENT: PERRL, EOMI, no scleral icterus Cardiac: RRR, no rubs, murmurs or gallops Pulm: clear to auscultation bilaterally, moving normal volumes of air Abd: soft, nontender, nondistended, BS present Ext: warm and well perfused, no pedal edema Neuro: alert and oriented X3, cranial nerves II-XII grossly intact  Lab Results: Basic Metabolic Panel:  Recent Labs Lab 01/22/14 0225 01/23/14 0520 01/23/14 0536  NA 137  --  135*  K 5.3  --  4.9  CL 92*  --  88*  CO2 25  --  25  GLUCOSE 104*  --  106*  BUN 69*  --  63*  CREATININE 13.60*  --  11.08*  CALCIUM 9.4  --  9.6  MG  --  2.3  --    CBC:  Recent Labs Lab 01/21/14 0523   01/22/14 0225 01/23/14 0536  WBC 5.0  --  4.5 5.0  NEUTROABS 3.7  --   --   --   HGB 7.2*  < > 8.2* 8.0*  HCT 21.1*  < > 24.2* 23.8*  MCV 93.8  --  93.1 96.0  PLT 120*  --  110* 119*  < > = values in this interval not displayed.  Micro Results: Recent Results (from the past 240 hour(s))  MRSA PCR SCREENING     Status: None   Collection Time    01/21/14  3:04 PM      Result Value Ref Range Status   MRSA by PCR NEGATIVE  NEGATIVE Final   Comment:            The GeneXpert MRSA Assay (FDA     approved for NASAL specimens     only), is one component of a     comprehensive MRSA colonization     surveillance program. It is not     intended to diagnose MRSA     infection nor to guide or     monitor treatment for     MRSA infections.   Studies/Results: Dg  Chest 2 View  01/24/2014   CLINICAL DATA:  Respiratory failure  EXAM: CHEST  2 VIEW  COMPARISON:  January 22, 2014  FINDINGS: There has been interval clearing of interstitial edema. There is atelectatic change in the right and left lung bases. Elsewhere, the lungs are clear. The heart is mildly enlarged with pulmonary vascularity within normal limits. There is moderate peribronchial thickening.  No adenopathy.  No bone lesions.  IMPRESSION: Currently no edema or consolidation. There is central interstitial thickening consistent with a degree of bronchitis. There is patchy bibasilar atelectatic change. Heart is mildly enlarged.   Electronically Signed   By: Lowella Grip M.D.   On: 01/24/2014 08:48   Medications: I have reviewed the patient's current medications. Scheduled Meds: . amLODipine  10 mg Oral Daily  . antiseptic oral rinse  7 mL Mouth Rinse BID  . aspirin  81 mg Oral Daily  . carvedilol  25 mg Oral BID WC  . cloNIDine  0.2 mg Oral BID  . levETIRAcetam  500 mg Oral BID  . losartan  100 mg Oral QHS  . nicotine  21 mg Transdermal Daily  . pantoprazole  40 mg Oral Daily  . senna-docusate  1 tablet Oral Daily    Continuous Infusions:  PRN Meds:.sodium chloride, sodium chloride, alteplase, diphenhydrAMINE, feeding supplement (NEPRO CARB STEADY), heparin, lidocaine (PF), lidocaine-prilocaine, metoprolol, nitroGLYCERIN, pentafluoroprop-tetrafluoroeth Assessment/Plan: #Afib RVR--Pt had one episode during HD 9/9 that resolved with IV Metoprolol and repeat documented event on 9/10 with HR 130-140s sustained for 5 min that spontaneously resolved w/o treatment. Pt was asymptomatic during events but now that is aware states he has some sensations (?palpitations) on/off daily. CE x3 negative since admission. CHADSVasc score of 2 and HASBLED score of 3.  -repeat EKG in AM -consult cardiology (recs on rate control with possible changing/adding agent in setting of cocaine abuse vs rhythm control with amiodarone, need to consider NOVAC in setting of hasbled score and noncompliance eliquis maybe only option as pt is ESRD if warfarin is not pursued)  #Hypertensive emergency with acute hypoxic respiratory failure in the setting of pulmonary edema 2/2 non compliance with HD- Resolved-- Pt has SBP in 140-160s this AM. Pulse ox during ambulation was 97% on RA and pt has remained nonhypoxic on RA since transfer from ICU. Repeat CXR shows resolution this AM.  -monitor on tele  -continue home medications (norvasc, coreg, clonidine, and losartan)--would consider adjusting coreg dose to home regimen (25mg  bid on non-HD days and QD on HD days) if HR improved  -he is on chronic prednisone 10mg  daily (will have day team investigate reason and consider tapering off if not needed)   #ESRD on HD (T,Th,Sat); non-compliant. 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 HIV still pending. In terms of improving compliance may need to consider adding some sort of prophylactic HA treatment or evaluation by neurology (apparently pt was seen by Dr. Janann Colonel at one point but left clinic without being  evaluated) some recommendation since pt also has severe underlying anxiety would be nortriptyline with caution given recent afib events.  -Appreciate nephrology following -HD on T, H, Sat  #Chronic normocytic anemia and thrombocytopenia--Baseline hemoglobin ~ 7-8. Hb 7.2 on admission, improved to 8.0 today.  -trend Hb and platelets, no signs of active bleeding  -SCDs for now   #Seizure disorder with noted hx of PRES with status epilepticus rx with intubation and antiepileptics per nephrology note on admission. On Keppra 500 mg  twice daily at home. No documented or witnessed seizures during this admission -seizure precautions  -Continue home dose keppra   #Substance abuse--noted current cocaine and tobacco use. Pt states recent cocaine use 4 days prior to admission. He has also apparently been in outpatient rehabilitation several times without success.  -Counseled on cessation  -Nicotine patch  -pt is at contemplation stage for quitting    Dispo: Disposition is deferred at this time, awaiting improvement of current medical problems.  Anticipated discharge in approximately 1-2 day(s).   The patient does have a current PCP Leamon Arnt, MD) and does need an [facility] hospital follow-up appointment after discharge.  The patient does not have transportation limitations that hinder transportation to clinic appointments.  .Services Needed at time of discharge: Y = Yes, Blank = No PT:   OT:   RN:   Equipment:   Other:     LOS: 3 days   Clinton Gallant, MD 01/24/2014, 9:38 AM

## 2014-01-24 NOTE — Progress Notes (Addendum)
Subjective: Jeremy Mccoy had a 5 minute episode of atrial fibrillation yesterday as he was finishing dialysis.  When questioned about it he reports that he does now recall feeling similar episodes for many years.  Otherwise he reports no complaints except that he has not had a bowel movement since admission.  Denies dyspnea and headache.  Objective: Vital signs in last 24 hours: Filed Vitals:   01/23/14 2320 01/24/14 0019 01/24/14 0610 01/24/14 1000  BP: 157/89 163/98 141/90 118/110  Pulse: 99 99 96 87  Temp:  98.9 F (37.2 C) 98.5 F (36.9 C) 98.8 F (37.1 C)  TempSrc:  Oral Oral Oral  Resp: 20 18 20 20   Height:      Weight:      SpO2:  95% 97% 98%   Weight change:   Intake/Output Summary (Last 24 hours) at 01/24/14 1031 Last data filed at 01/24/14 0900  Gross per 24 hour  Intake   1200 ml  Output   2650 ml  Net  -1450 ml   General: Responsive and in NAD HEENT: MMM. PERLLA. No lymphedema or masses CV: RRR, rubs, or gallops. S2 murmur 3/6 appreciated at apex Pulm: CTA bilaterally, no wheezes, crackles, or ronchi Abd:  NBS in 4Q.  Non-tender to deep and light palpation Ext: No edema or swelling.  Pulses 2+ bilaterally.  Persistent bruising of interior calves present Skin: dry, no suspicious rashes or lesions Neuro: A and A x3. CN grossly intact   Medications: I have reviewed the patient's current medications. Scheduled Meds: . amLODipine  10 mg Oral Daily  . antiseptic oral rinse  7 mL Mouth Rinse BID  . aspirin  81 mg Oral Daily  . carvedilol  25 mg Oral BID WC  . cloNIDine  0.2 mg Oral BID  . levETIRAcetam  500 mg Oral BID  . losartan  100 mg Oral QHS  . nicotine  21 mg Transdermal Daily  . pantoprazole  40 mg Oral Daily  . senna-docusate  1 tablet Oral Daily   Continuous Infusions:  PRN Meds:.sodium chloride, sodium chloride, alteplase, diphenhydrAMINE, feeding supplement (NEPRO CARB STEADY), heparin, lidocaine (PF), lidocaine-prilocaine, metoprolol,  nitroGLYCERIN, pentafluoroprop-tetrafluoroeth Assessment/Plan: Principal Problem:   Malignant hypertension Active Problems:   Tobacco abuse   Cocaine abuse   Pulmonary edema   Abnormal EKG   Anemia in chronic kidney disease   Acute respiratory failure   Hyperkalemia   Seizure disorder   ESRD on hemodialysis   High anion gap metabolic acidosis   Atrial fibrillation with RVR   Thrombocytopenia, unspecified   Unspecified constipation  Hypertensive emergency with acute hypoxic respiratory failure in the setting of pulmonary edema 2/2 non compliance with HD--now resolved after nicardipine gtt and HD sessions. BP improved to 140/905 this morning. S/p 3HD sessions since admission. CXR improved this morning. Echo (9/10): Low normal LV function; moderate LVH; grade 1 diastolic dysfunction; mild LAE; mild MR; small pericardial effusion with fibrinous strands noted. Compared to 11/05/13, pericardial effusion is slightly larger. -monitor on tele  - clonidine .2mg  po bid - diltiazem CD 240 mg po qd - losartan 100 mg po qhs -resume HD per regular schedule (T,Th,Sat)  -pulse ox with ambulation to 94% -consider PFTs as outpatient--?hx of COPD, current smoker and cocaine abuse    ESRD on HD (T,Th,Sat); non-compliant. 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  -Per nephrology:1) resume T,Th,S HD schedule 2) discontinue prednisone 3) for  dialysis headache consider chlorpromazine, mag oxide, nortriptyline, topiramate for headache -Stressed the importance of compliance with HD sessions  -HIV pending -HD tomorroow  Afib RVR--patient experienced 2nd episode of a-fib yesterday, which resolved spontaneously. CHADS2-VASC score 1  vs. HAS BLED score 3/4 .   -CE x3 negative  -per cardiology: no anticoagulation necessary at this time, dc b-blocker, initiate diltiazem 240 mg po qd   Anion gap metabolic acidosis:  In the setting of ESRD. AG 31 on admission; now 71 -continue to monitor   Chronic normocytic anemia and thrombocytopenia--Baseline hemoglobin ~ 7-8. Hb 7.2.  Platelets improved to 158 today. -SCDs for now   Seizure disorder with noted hx of PRES with status epilepticus rx with intubation and antiepileptics per nephrology note on admission. On Keppra 500 mg twice daily at home.  Plan.  -Continue home dose keppra  -Control blood pressure   Substance abuse--noted current cocaine and tobacco use. Endorsed recent cocaine use 4 days prior to admission. He has also apparently been in outpatient rehabilitation several times without success.  -Counseled on cessation  -Nicotine patch  -He is considering to check himself into another facility for rehab and has been encouraged by [place] to do so. He reports having the information and we will try to have social work provide with him more information if possible.   Constipation--no recent BM  -senokot   Diet: Renal  DVT Ppx: SCDs (thrombocytopenia)  Dispo: Disposition is deferred at this time, awaiting improvement of current medical problems. Anticipated discharge in approximately 1-2 day(s).  This is a Careers information officer Note.  The care of the patient was discussed with Dr. Algis Liming and the assessment and plan formulated with their assistance.  Please see their attached note for official documentation of the daily encounter.   LOS: 3 days   Charise Killian, Med Student 01/24/2014, 10:31 AM

## 2014-01-24 NOTE — H&P (Signed)
See attending admission note.

## 2014-01-24 NOTE — Progress Notes (Signed)
   I have seen the patient and reviewed the daily progress note by Charise Killian MS 4 and discussed the care of the patient with them.  See my note for documentation of my findings, assessment, and plans.  Clinton Gallant, MD  IM PGY-3

## 2014-01-25 LAB — CBC
HCT: 22.1 % — ABNORMAL LOW (ref 39.0–52.0)
HEMOGLOBIN: 7.4 g/dL — AB (ref 13.0–17.0)
MCH: 31.8 pg (ref 26.0–34.0)
MCHC: 33.5 g/dL (ref 30.0–36.0)
MCV: 94.8 fL (ref 78.0–100.0)
Platelets: 167 10*3/uL (ref 150–400)
RBC: 2.33 MIL/uL — ABNORMAL LOW (ref 4.22–5.81)
RDW: 14.8 % (ref 11.5–15.5)
WBC: 4 10*3/uL (ref 4.0–10.5)

## 2014-01-25 LAB — RENAL FUNCTION PANEL
ANION GAP: 23 — AB (ref 5–15)
Albumin: 3.4 g/dL — ABNORMAL LOW (ref 3.5–5.2)
BUN: 83 mg/dL — AB (ref 6–23)
CO2: 21 mEq/L (ref 19–32)
Calcium: 9.6 mg/dL (ref 8.4–10.5)
Chloride: 95 mEq/L — ABNORMAL LOW (ref 96–112)
Creatinine, Ser: 12.12 mg/dL — ABNORMAL HIGH (ref 0.50–1.35)
GFR calc Af Amer: 5 mL/min — ABNORMAL LOW (ref >90)
GFR calc non Af Amer: 4 mL/min — ABNORMAL LOW (ref >90)
GLUCOSE: 99 mg/dL (ref 70–99)
POTASSIUM: 5.3 meq/L (ref 3.7–5.3)
Phosphorus: 9 mg/dL — ABNORMAL HIGH (ref 2.3–4.6)
Sodium: 139 mEq/L (ref 137–147)

## 2014-01-25 LAB — TSH: TSH: 2.89 u[IU]/mL (ref 0.350–4.500)

## 2014-01-25 MED ORDER — CARVEDILOL 25 MG PO TABS: 25.0000 mg | ORAL_TABLET | Freq: Every day | ORAL | Status: DC

## 2014-01-25 MED ORDER — LIDOCAINE-PRILOCAINE 2.5-2.5 % EX CREA: 1.0000 "application " | TOPICAL_CREAM | CUTANEOUS | Status: DC | PRN

## 2014-01-25 MED ORDER — SODIUM CHLORIDE 0.9 % IV SOLN: 100.0000 mL | INTRAVENOUS | Status: DC | PRN

## 2014-01-25 MED ORDER — PANTOPRAZOLE SODIUM 40 MG PO TBEC: 40.0000 mg | DELAYED_RELEASE_TABLET | Freq: Every day | ORAL | Status: DC

## 2014-01-25 MED ORDER — SODIUM CHLORIDE 0.9 % IV SOLN
100.0000 mL | INTRAVENOUS | Status: DC | PRN
Start: 2014-01-25 — End: 2014-01-25

## 2014-01-25 MED ORDER — CARVEDILOL 25 MG PO TABS: 25.0000 mg | ORAL_TABLET | Freq: Two times a day (BID) | ORAL | Status: DC

## 2014-01-25 MED ORDER — AMLODIPINE BESYLATE 10 MG PO TABS: 10.0000 mg | ORAL_TABLET | Freq: Every day | ORAL | Status: DC

## 2014-01-25 MED ORDER — HEPARIN SODIUM (PORCINE) 1000 UNIT/ML DIALYSIS: 1000.0000 [IU] | INTRAMUSCULAR | Status: DC | PRN

## 2014-01-25 MED ORDER — SEVELAMER CARBONATE 800 MG PO TABS
1600.0000 mg | ORAL_TABLET | Freq: Three times a day (TID) | ORAL | Status: DC
  Administered 2014-01-25 (×2): 1600 mg via ORAL
  Filled 2014-01-25 (×3): qty 2

## 2014-01-25 MED ORDER — ALTEPLASE 2 MG IJ SOLR
2.0000 mg | Freq: Once | INTRAMUSCULAR | Status: DC | PRN
  Filled 2014-01-25: qty 2

## 2014-01-25 MED ORDER — FAMOTIDINE 20 MG PO TABS: 20.0000 mg | ORAL_TABLET | Freq: Every day | ORAL | Status: DC

## 2014-01-25 MED ORDER — SEVELAMER CARBONATE 800 MG PO TABS: 1600.0000 mg | ORAL_TABLET | Freq: Three times a day (TID) | ORAL | Status: AC

## 2014-01-25 MED ORDER — CARVEDILOL 25 MG PO TABS
25.0000 mg | ORAL_TABLET | Freq: Once | ORAL | Status: DC
  Filled 2014-01-25: qty 1

## 2014-01-25 MED ORDER — ASPIRIN 81 MG PO CHEW: 81.0000 mg | CHEWABLE_TABLET | Freq: Every day | ORAL | Status: DC

## 2014-01-25 MED ORDER — LIDOCAINE HCL (PF) 1 % IJ SOLN: 5.0000 mL | INTRAMUSCULAR | Status: DC | PRN

## 2014-01-25 MED ORDER — PENTAFLUOROPROP-TETRAFLUOROETH EX AERO: 1.0000 "application " | INHALATION_SPRAY | CUTANEOUS | Status: DC | PRN

## 2014-01-25 MED ORDER — NEPRO/CARBSTEADY PO LIQD: 237.0000 mL | ORAL | Status: DC | PRN

## 2014-01-25 NOTE — Progress Notes (Signed)
Discussed current meds with primary team. I think it is very important that he remain on carvedilol and would be particularly averse to stopping it abruptly. I do not think the diltiazem is a better choice for AF rate control or for his BP. As pointed out, the cause for the recent events was noncompliance with his dialysis and I am hopeful that AF with RVR will be infrequent if he takes HD as prescribed. I would put him back on carvedilol and amlodipine, in the doses previously prescribed, and stop diltiazem.  Sanda Klein, MD, [facility] [phone] office [phone] pager

## 2014-01-25 NOTE — Progress Notes (Signed)
[facility] Progress Note  Assessment/Plan: 1. Actue resp failure- r/t cocaine. Resolved with HD-no edema on repeat chest xray  2. ESRD - TTS @ [facility] pending 3. Anemia - hgb 8, down to 7.4 - ; cont ESA tuesday -  4. Secondary hyperparathyroidism - P 6.4 - not on binder , on hectorol - on renvela 2 ac and velphoro 2 ac at home - but dislikes the velphoro - resume renvela 2 ac and have his HD center work out titrating the renvela up as needed 5. HTN/volume - 142/84, cont clonidine, cozaar, norvasc changed to dilt. Close to edw - BP elevated - may be due to not getting clonidine 6. Nutrition - renal diet. Vit. nepro  7. Headache- intermittent 8. Thrombocytopenia - platelets back to normal 9. PAF with RVR - EF 50 -55% - off BB due to cocaine use -  10. Cocaine - abuse - professes to not be as big a user as he once was - I don't think HE perceives it to be a big problem  Myriam Jacobson, PA-C [place] [phone] 01/25/2014,8:11 AM  LOS: 4 days   Pt seen, examined and agree w A/P as above. No coumadin per cardiology, low CHADS score.  Stable for dc from renal standpoint.   Kelly Splinter MD pager [phone]    cell [phone] 01/25/2014, 11:53 AM    Subjective:   Hates dialysis  Objective Filed Vitals:   01/24/14 1110 01/24/14 1800 01/25/14 0759 01/25/14 0800  BP: 142/84 148/86 155/113 160/95  Pulse: 90 88 85 78  Temp:  98.4 F (36.9 C) 97.7 F (36.5 C)   TempSrc:  Oral Oral   Resp:  18 20   Height:      Weight:   74.9 kg (165 lb 2 oz)   SpO2:  98%     Physical Exam General: NAD Heart:  RRR Lungs: no wheezes or rales Abdomen: soft NT Extremities: no edema Dialysis Access: left AVF  Dialysis Orders: TTS [place]  4h 33min 450/800 1K/2.5 [place] 71.5kg LFA AVF Heparin none  Hectorol 3 TIW, Aranesp 120 ug q Tues   Additional Objective Labs: Basic Metabolic Panel:  Recent Labs Lab 01/22/14 0225 01/23/14 0536 01/24/14 1146  NA  137 135* 137  K 5.3 4.9 4.2  CL 92* 88* 92*  CO2 25 25 26   GLUCOSE 104* 106* 124*  BUN 69* 63* 57*  CREATININE 13.60* 11.08* 9.49*  CALCIUM 9.4 9.6 9.7  PHOS  --   --  6.4*   Liver Function Tests:  Recent Labs Lab 01/21/14 0523 01/22/14 0225 01/24/14 1146  AST 15 9  --   ALT 13 9  --   ALKPHOS 70 58  --   BILITOT 0.4 0.4  --   PROT 7.4 6.9  --   ALBUMIN 4.1 3.4* 3.5    Recent Labs Lab 01/21/14 0523  LIPASE 24   CBC:  Recent Labs Lab 01/21/14 0523  01/22/14 0225 01/23/14 0536 01/24/14 1146  WBC 5.0  --  4.5 5.0 3.8*  NEUTROABS 3.7  --   --   --   --   HGB 7.2*  < > 8.2* 8.0* 7.4*  HCT 21.1*  < > 24.2* 23.8* 22.5*  MCV 93.8  --  93.1 96.0 93.8  PLT 120*  --  110* 119* 158  < > = values in this interval not displayed. Cardiac Enzymes:  Recent Labs Lab 01/21/14 1605 01/21/14 2033 01/22/14 1210 01/22/14  1600 01/22/14 2201  TROPONINI <0.30 <0.30 <0.30 <0.30 <0.30   CBG:  Recent Labs Lab 01/21/14 0506 01/21/14 1517  GLUCAP 94 93  Studies/Results: Dg Chest 2 View  01/24/2014   CLINICAL DATA:  Respiratory failure  EXAM: CHEST  2 VIEW  COMPARISON:  January 22, 2014  FINDINGS: There has been interval clearing of interstitial edema. There is atelectatic change in the right and left lung bases. Elsewhere, the lungs are clear. The heart is mildly enlarged with pulmonary vascularity within normal limits. There is moderate peribronchial thickening.  No adenopathy.  No bone lesions.  IMPRESSION: Currently no edema or consolidation. There is central interstitial thickening consistent with a degree of bronchitis. There is patchy bibasilar atelectatic change. Heart is mildly enlarged.   Electronically Signed   By: Lowella Grip M.D.   On: 01/24/2014 08:48   Medications:   . antiseptic oral rinse  7 mL Mouth Rinse BID  . aspirin  81 mg Oral Daily  . cloNIDine  0.2 mg Oral BID  . diltiazem  240 mg Oral Daily  . levETIRAcetam  500 mg Oral BID  . losartan  100  mg Oral QHS  . multivitamin  1 tablet Oral QHS  . nicotine  21 mg Transdermal Daily  . pantoprazole  40 mg Oral Daily  . senna-docusate  1 tablet Oral Daily

## 2014-01-25 NOTE — Progress Notes (Signed)
Patient discharge teaching given, including activity, diet, follow-up appoints, and medications. Patient verbalized understanding of all discharge instructions. IV access was d/c'd. Vitals are stable. Skin is intact except as charted in most recent assessments. Pt to be escorted out by NT, to be driven home by family.  Niamya Vittitow, MBA, BS, RN 

## 2014-01-25 NOTE — Progress Notes (Signed)
Subjective: Pt denies sob.  He has mild h/a today.  He denies chest pain, sob, abdominal pain.    Objective: Vital signs in last 24 hours: Filed Vitals:   01/25/14 1100 01/25/14 1200 01/25/14 1249 01/25/14 1700  BP: 136/81 162/110 183/111 153/100  Pulse: 85 80 94 98  Temp:  97.4 F (36.3 C) 98.8 F (37.1 C)   TempSrc:  Oral Oral   Resp: 22 23 20    Height:      Weight:  158 lb 1.1 oz (71.7 kg)    SpO2:   96%    Weight change:   Intake/Output Summary (Last 24 hours) at 01/25/14 1806 Last data filed at 01/25/14 1405  Gross per 24 hour  Intake    600 ml  Output   4000 ml  Net  -3400 ml   Vitals reviewed. General: resting in bed, NAD HEENT: [place]/at, no scleral icterus Cardiac: RRR, no rubs, murmurs or gallops Pulm: clear to auscultation bilaterally, no wheezes, rales, or rhonchi Abd: soft, nontender, nondistended, BS present Ext: warm and well perfused, no pedal edema Neuro: alert and oriented X3, cranial nerves II-XII grossly intact   Lab Results: Basic Metabolic Panel:  Recent Labs Lab 01/23/14 0520  01/24/14 1146 01/25/14 0810  NA  --   < > 137 139  K  --   < > 4.2 5.3  CL  --   < > 92* 95*  CO2  --   < > 26 21  GLUCOSE  --   < > 124* 99  BUN  --   < > 57* 83*  CREATININE  --   < > 9.49* 12.12*  CALCIUM  --   < > 9.7 9.6  MG 2.3  --  2.3  --   PHOS  --   --  6.4* 9.0*  < > = values in this interval not displayed. Liver Function Tests:  Recent Labs Lab 01/21/14 0523 01/22/14 0225 01/24/14 1146 01/25/14 0810  AST 15 9  --   --   ALT 13 9  --   --   ALKPHOS 70 58  --   --   BILITOT 0.4 0.4  --   --   PROT 7.4 6.9  --   --   ALBUMIN 4.1 3.4* 3.5 3.4*    Recent Labs Lab 01/21/14 0523  LIPASE 24   CBC:  Recent Labs Lab 01/21/14 0523  01/24/14 1146 01/25/14 0809  WBC 5.0  < > 3.8* 4.0  NEUTROABS 3.7  --   --   --   HGB 7.2*  < > 7.4* 7.4*  HCT 21.1*  < > 22.5* 22.1*  MCV 93.8  < > 93.8 94.8  PLT 120*  < > 158 167  < > = values in this  interval not displayed. Cardiac Enzymes:  Recent Labs Lab 01/22/14 1210 01/22/14 1600 01/22/14 2201  TROPONINI <0.30 <0.30 <0.30   CBG:  Recent Labs Lab 01/21/14 0506 01/21/14 1517  GLUCAP 94 93    Thyroid Function Tests:  Recent Labs Lab 01/25/14 0809  TSH 2.890   Coagulation:  Recent Labs Lab 01/22/14 0225  LABPROT 17.8*  INR 1.47   Urine Drug Screen: Drugs of Abuse     Component Value Date/Time   LABOPIA NEGATIVE 11/05/2011 1441   COCAINSCRNUR POSITIVE* 11/05/2011 1441   LABBENZ NEGATIVE 11/05/2011 1441   AMPHETMU NEGATIVE 11/05/2011 1441   Misc. Labs: none  Micro Results: Recent Results (from the past 240  hour(s))  MRSA PCR SCREENING     Status: None   Collection Time    01/21/14  3:04 PM      Result Value Ref Range Status   MRSA by PCR NEGATIVE  NEGATIVE Final   Comment:            The GeneXpert MRSA Assay (FDA     approved for NASAL specimens     only), is one component of a     comprehensive MRSA colonization     surveillance program. It is not     intended to diagnose MRSA     infection nor to guide or     monitor treatment for     MRSA infections.   Studies/Results: Dg Chest 2 View  01/24/2014   CLINICAL DATA:  Respiratory failure  EXAM: CHEST  2 VIEW  COMPARISON:  January 22, 2014  FINDINGS: There has been interval clearing of interstitial edema. There is atelectatic change in the right and left lung bases. Elsewhere, the lungs are clear. The heart is mildly enlarged with pulmonary vascularity within normal limits. There is moderate peribronchial thickening.  No adenopathy.  No bone lesions.  IMPRESSION: Currently no edema or consolidation. There is central interstitial thickening consistent with a degree of bronchitis. There is patchy bibasilar atelectatic change. Heart is mildly enlarged.   Electronically Signed   By: Lowella Grip M.D.   On: 01/24/2014 08:48   Medications:  Scheduled Meds: . antiseptic oral rinse  7 mL Mouth Rinse BID   . aspirin  81 mg Oral Daily  . cloNIDine  0.2 mg Oral BID  . diltiazem  240 mg Oral Daily  . levETIRAcetam  500 mg Oral BID  . losartan  100 mg Oral QHS  . multivitamin  1 tablet Oral QHS  . nicotine  21 mg Transdermal Daily  . pantoprazole  40 mg Oral Daily  . senna-docusate  1 tablet Oral Daily  . sevelamer carbonate  1,600 mg Oral TID WC   Continuous Infusions:  PRN Meds:.diphenhydrAMINE, lidocaine (PF), lidocaine-prilocaine, metoprolol, nitroGLYCERIN Assessment/Plan: 48 y.o ESRD on HD TThSat, HTN, anxiety/depression, cocaine abuse presented with malignant HTN initially on Nicardipine drip with acute hypoxic respiratory failure 2/2 pulmonary edema (resolved) likely 2/2 uncontrolled HTN, possibly cocaine abuse and missing 3 days of HD.  He also had hyperkalemia on admission to 7.6 which resolved with dialysis. Now with episodes of Atrial fibrillation with RVR this admission  # Atrial fibrillation with RVR -rate more controlled today -Spoke with Dr. Sallyanne Kuster who rec d/c Diltiazem CD 240 mg daily and putting him back on Coreg (home dose 25 on HD days and 25 bid on non HD days), Norvasc 10 mg (home dose), continue Aspirin with CHADSVASC 1.   -will ensure cardiology f/u outpatient  #hypertension -uncontrolled intermittently with hypertensive emergency on admission needed Nicardipine gtt  -at discharge will continue clonidine 0.2 mg bid, Cozaar 100 mg qd, Norvasc 10 mg, coreg (home dose)  #Tobacco abuse/Cocaine abuse -seen by social work this [employer]  -encouraged cessation  -Nicotine patch      #ESRD on hemodialysis TThSat -associated with anemia of chronic disease  -continue Renvella, Velphoro  #Unspecified constipation -Senna-S  #Seizure disorder -no seizures this admission, Keppra 500 mg bid  -seizure precautions  -pending Neurology f/u outpatient   #GERD -protonix continued  #F/E/N -NSL -renal diet   #DVT px  -scds   Dispo: D/c today  The patient does have a  current PCP Leamon Arnt,  MD) and does not need an [facility] follow-up appointment after discharge.  The patient does have transportation limitations that hinder transportation to clinic appointments.  .Services Needed at time of discharge: Y = Yes, Blank = No PT:   OT:   RN:   Equipment:   Other:     LOS: 4 days   Cresenciano Genre, MD [phone] 01/25/2014, 6:06 PM

## 2014-01-25 NOTE — Progress Notes (Signed)
SATURATION QUALIFICATIONS: (This note is used to comply with regulatory documentation for home oxygen)  Patient Saturations on Room Air at Rest = 96%  Patient Saturations on Room Air while Ambulating = 98%  Please briefly explain why patient needs home oxygen: n/a  Jillyn Ledger, MBA, BS, RN

## 2014-01-25 NOTE — Progress Notes (Signed)
Patient Name: Jeremy Mccoy Date of Encounter: 01/25/2014  Principal Problem:   Malignant hypertension Active Problems:   Tobacco abuse   Cocaine abuse   Pulmonary edema   Abnormal EKG   Anemia in chronic kidney disease   Acute respiratory failure   Hyperkalemia   Seizure disorder   ESRD on hemodialysis   High anion gap metabolic acidosis   Atrial fibrillation with RVR   Thrombocytopenia, unspecified   Unspecified constipation   Length of Stay: 4  SUBJECTIVE  Feels well. Rare brief palpitations. No dyspnea or angina or headache.  CURRENT MEDS . antiseptic oral rinse  7 mL Mouth Rinse BID  . aspirin  81 mg Oral Daily  . cloNIDine  0.2 mg Oral BID  . diltiazem  240 mg Oral Daily  . levETIRAcetam  500 mg Oral BID  . losartan  100 mg Oral QHS  . multivitamin  1 tablet Oral QHS  . nicotine  21 mg Transdermal Daily  . pantoprazole  40 mg Oral Daily  . senna-docusate  1 tablet Oral Daily  . sevelamer carbonate  1,600 mg Oral TID WC    OBJECTIVE   Intake/Output Summary (Last 24 hours) at 01/25/14 1155 Last data filed at 01/24/14 1700  Gross per 24 hour  Intake    720 ml  Output      0 ml  Net    720 ml   Filed Weights   01/23/14 2030 01/23/14 2315 01/25/14 0759  Weight: 166 lb 7.2 oz (75.5 kg) 158 lb 15.2 oz (72.1 kg) 165 lb 2 oz (74.9 kg)    PHYSICAL EXAM Filed Vitals:   01/25/14 0930 01/25/14 1000 01/25/14 1030 01/25/14 1100  BP: 161/108 161/107 174/121 136/81  Pulse: 86 80 85 85  Temp:      TempSrc:      Resp: 22 16 15 22   Height:      Weight:      SpO2:       General: Alert, oriented x3, no distress. Pale. Seen during HD Head: no evidence of trauma, PERRL, EOMI, no exophtalmos or lid lag, no myxedema, no xanthelasma; normal ears, nose and oropharynx Neck: normal jugular venous pulsations and no hepatojugular reflux; brisk carotid pulses without delay and no carotid bruits Chest: clear to auscultation, no signs of consolidation by percussion or  palpation, normal fremitus, symmetrical and full respiratory excursions Cardiovascular: normal position and quality of the apical impulse, regular rhythm, normal first and second heart sounds, no rubs or gallops, 1/6 flow systolic murmur Abdomen: no tenderness or distention, no masses by palpation, no abnormal pulsatility or arterial bruits, normal bowel sounds, no hepatosplenomegaly Extremities: no clubbing, cyanosis or edema; 2+ radial, ulnar and brachial pulses bilaterally; 2+ right femoral, posterior tibial and dorsalis pedis pulses; 2+ left femoral, posterior tibial and dorsalis pedis pulses; no subclavian or femoral bruits Neurological: grossly nonfocal  LABS  CBC  Recent Labs  01/24/14 1146 01/25/14 0809  WBC 3.8* 4.0  HGB 7.4* 7.4*  HCT 22.5* 22.1*  MCV 93.8 94.8  PLT 158 [id number]   Basic Metabolic Panel  Recent Labs  01/23/14 0520  01/24/14 1146 01/25/14 0810  NA  --   < > 137 139  K  --   < > 4.2 5.3  CL  --   < > 92* 95*  CO2  --   < > 26 21  GLUCOSE  --   < > 124* 99  BUN  --   < >  57* 83*  CREATININE  --   < > 9.49* 12.12*  CALCIUM  --   < > 9.7 9.6  MG 2.3  --  2.3  --   PHOS  --   --  6.4* 9.0*  < > = values in this interval not displayed. Liver Function Tests  Recent Labs  01/24/14 1146 01/25/14 0810  ALBUMIN 3.5 3.4*    Recent Labs  01/25/14 0809  TSH 2.890    Radiology Studies Imaging results have been reviewed and Dg Chest 2 View  01/24/2014   CLINICAL DATA:  Respiratory failure  EXAM: CHEST  2 VIEW  COMPARISON:  January 22, 2014  FINDINGS: There has been interval clearing of interstitial edema. There is atelectatic change in the right and left lung bases. Elsewhere, the lungs are clear. The heart is mildly enlarged with pulmonary vascularity within normal limits. There is moderate peribronchial thickening.  No adenopathy.  No bone lesions.  IMPRESSION: Currently no edema or consolidation. There is central interstitial thickening consistent with a  degree of bronchitis. There is patchy bibasilar atelectatic change. Heart is mildly enlarged.   Electronically Signed   By: Lowella Grip M.D.   On: 01/24/2014 08:48    TELE NSR  ECG AF w RVR on 9/9  ASSESSMENT AND PLAN Episode of PAF related to hypertensive crisis with CHF and cocaine use CHF in turn secondary to renal failure/anuria and missed hemodialysis sessions Agree that anticoagulation is not clearly indicated and may be dangerous in this patient with history of noncompliance. ASA for stroke prevention. Diltiazem has been substituted with diltiazem for rate control if PAF occurs again. I think the likelihood of arrhythmia recurrence is relatively low (preserved LVEF, only mildly dilated LA)  if he gets dialysis regularly and stops using cocaine (which he promises he will do).   Sanda Klein, MD, [facility] [phone] office [phone] pager 01/25/2014 11:55 AM

## 2014-01-25 NOTE — Discharge Instructions (Addendum)
Atrial Fibrillation °Atrial fibrillation is a type of irregular heart rhythm (arrhythmia). During atrial fibrillation, the upper chambers of the heart (atria) quiver continuously in a chaotic pattern. This causes an irregular and often rapid heart rate.  °Atrial fibrillation is the result of the heart becoming overloaded with disorganized signals that tell it to beat. These signals are normally released one at a time by a part of the right atrium called the sinoatrial node. They then travel from the atria to the lower chambers of the heart (ventricles), causing the atria and ventricles to contract and pump blood as they pass. In atrial fibrillation, parts of the atria outside of the sinoatrial node also release these signals. This results in two problems. First, the atria receive so many signals that they do not have time to fully contract. Second, the ventricles, which can only receive one signal at a time, beat irregularly and out of rhythm with the atria.  °There are three types of atrial fibrillation:  °· Paroxysmal. Paroxysmal atrial fibrillation starts suddenly and stops on its own within a week. °· Persistent. Persistent atrial fibrillation lasts for more than a week. It may stop on its own or with treatment. °· Permanent. Permanent atrial fibrillation does not go away. Episodes of atrial fibrillation may lead to permanent atrial fibrillation. °Atrial fibrillation can prevent your heart from pumping blood normally. It increases your risk of stroke and can lead to heart failure.  °CAUSES  °· Heart conditions, including a heart attack, heart failure, coronary artery disease, and heart valve conditions.   °· Inflammation of the sac that surrounds the heart (pericarditis). °· Blockage of an artery in the lungs (pulmonary embolism). °· Pneumonia or other infections. °· Chronic lung disease. °· Thyroid problems, especially if the thyroid is overactive (hyperthyroidism). °· Caffeine, excessive alcohol use, and use  of some illegal drugs.   °· Use of some medicines, including certain decongestants and diet pills. °· Heart surgery.   °· Birth defects.   °Sometimes, no cause can be found. When this happens, the atrial fibrillation is called lone atrial fibrillation. The risk of complications from atrial fibrillation increases if you have lone atrial fibrillation and you are age 60 years or older. °RISK FACTORS °· Heart failure. °· Coronary artery disease. °· Diabetes mellitus.   °· High blood pressure (hypertension).   °· Obesity.   °· Other arrhythmias.   °· Increased age. °SIGNS AND SYMPTOMS  °· A feeling that your heart is beating rapidly or irregularly.   °· A feeling of discomfort or pain in your chest.   °· Shortness of breath.   °· Sudden light-headedness or weakness.   °· Getting tired easily when exercising.   °· Urinating more often than normal (mainly when atrial fibrillation first begins).   °In paroxysmal atrial fibrillation, symptoms may start and suddenly stop. °DIAGNOSIS  °Your health care provider may be able to detect atrial fibrillation when taking your pulse. Your health care provider may have you take a test called an ambulatory electrocardiogram (ECG). An ECG records your heartbeat patterns over a 24-hour period. You may also have other tests, such as: °· Transthoracic echocardiogram (TTE). During echocardiography, sound waves are used to evaluate how blood flows through your heart. °· Transesophageal echocardiogram (TEE). °· Stress test. There is more than one type of stress test. If a stress test is needed, ask your health care provider about which type is best for you. °· Chest X-ray exam. °· Blood tests. °· Computed tomography (CT). °TREATMENT  °Treatment may include: °· Treating any underlying conditions. For example, if you   have an overactive thyroid, treating the condition may correct atrial fibrillation.  Taking medicine. Medicines may be given to control a rapid heart rate or to prevent blood  clots, heart failure, or a stroke.  Having a procedure to correct the rhythm of the heart:  Electrical cardioversion. During electrical cardioversion, a controlled, low-energy shock is delivered to the heart through your skin. If you have chest pain, very low blood pressure, or sudden heart failure, this procedure may need to be done as an emergency.  Catheter ablation. During this procedure, heart tissues that send the signals that cause atrial fibrillation are destroyed.  Surgical ablation. During this surgery, thin lines of heart tissue that carry the abnormal signals are destroyed. This procedure can either be an open-heart surgery or a minimally invasive surgery. With the minimally invasive surgery, small cuts are made to access the heart instead of a large opening.  Pulmonary venous isolation. During this surgery, tissue around the veins that carry blood from the lungs (pulmonary veins) is destroyed. This tissue is thought to carry the abnormal signals. HOME CARE INSTRUCTIONS   Take medicines only as directed by your health care provider. Some medicines can make atrial fibrillation worse or recur.  If blood thinners were prescribed by your health care provider, take them exactly as directed. Too much blood-thinning medicine can cause bleeding. If you take too little, you will not have the needed protection against stroke and other problems.  Perform blood tests at home if directed by your health care provider. Perform blood tests exactly as directed.  Quit smoking if you smoke.  Do not drink alcohol.  Do not drink caffeinated beverages such as coffee, soda, and some teas. You may drink decaffeinated coffee, soda, or tea.   Maintain a healthy weight.Do not use diet pills unless your health care provider approves. They may make heart problems worse.   Follow diet instructions as directed by your health care provider.  Exercise regularly as directed by your health care  provider.  Keep all follow-up visits as directed by your health care provider. This is important. PREVENTION  The following substances can cause atrial fibrillation to recur:   Caffeinated beverages.  Alcohol.  Certain medicines, especially those used for breathing problems.  Certain herbs and herbal medicines, such as those containing ephedra or ginseng.  Illegal drugs, such as cocaine and amphetamines. Sometimes medicines are given to prevent atrial fibrillation from recurring. Proper treatment of any underlying condition is also important in helping prevent recurrence.  SEEK MEDICAL CARE IF:  You notice a change in the rate, rhythm, or strength of your heartbeat.  You suddenly begin urinating more frequently.  You tire more easily when exerting yourself or exercising. SEEK IMMEDIATE MEDICAL CARE IF:   You have chest pain, abdominal pain, sweating, or weakness.  You feel nauseous.  You have shortness of breath.  You suddenly have swollen feet and ankles.  You feel dizzy.  Your face or limbs feel numb or weak.  You have a change in your vision or speech. MAKE SURE YOU:   Understand these instructions.  Will watch your condition.  Will get help right away if you are not doing well or get worse. Document Released: 05/02/2005 Document Revised: 09/16/2013 Document Reviewed: 06/12/2012 [facility] Patient Information 2015 [place], [place]. This information is not intended to replace advice given to you by your health care provider. Make sure you discuss any questions you have with your health care provider.  Hypertension Hypertension, commonly called high  blood pressure, is when the force of blood pumping through your arteries is too strong. Your arteries are the blood vessels that carry blood from your heart throughout your body. A blood pressure reading consists of a higher number over a lower number, such as 110/72. The higher number (systolic) is the pressure inside your  arteries when your heart pumps. The lower number (diastolic) is the pressure inside your arteries when your heart relaxes. Ideally you want your blood pressure below 120/80. Hypertension forces your heart to work harder to pump blood. Your arteries may become narrow or stiff. Having hypertension puts you at risk for heart disease, stroke, and other problems.  RISK FACTORS Some risk factors for high blood pressure are controllable. Others are not.  Risk factors you cannot control include:   Race. You may be at higher risk if you are African American.  Age. Risk increases with age.  Gender. Men are at higher risk than women before age 25 years. After age 14, women are at higher risk than men. Risk factors you can control include:  Not getting enough exercise or physical activity.  Being overweight.  Getting too much fat, sugar, calories, or salt in your diet.  Drinking too much alcohol. SIGNS AND SYMPTOMS Hypertension does not usually cause signs or symptoms. Extremely high blood pressure (hypertensive crisis) may cause headache, anxiety, shortness of breath, and nosebleed. DIAGNOSIS  To check if you have hypertension, your health care provider will measure your blood pressure while you are seated, with your arm held at the level of your heart. It should be measured at least twice using the same arm. Certain conditions can cause a difference in blood pressure between your right and left arms. A blood pressure reading that is higher than normal on one occasion does not mean that you need treatment. If one blood pressure reading is high, ask your health care provider about having it checked again. TREATMENT  Treating high blood pressure includes making lifestyle changes and possibly taking medicine. Living a healthy lifestyle can help lower high blood pressure. You may need to change some of your habits. Lifestyle changes may include:  Following the DASH diet. This diet is high in fruits,  vegetables, and whole grains. It is low in salt, red meat, and added sugars.  Getting at least 2 hours of brisk physical activity every week.  Losing weight if necessary.  Not smoking.  Limiting alcoholic beverages.  Learning ways to reduce stress. If lifestyle changes are not enough to get your blood pressure under control, your health care provider may prescribe medicine. You may need to take more than one. Work closely with your health care provider to understand the risks and benefits. HOME CARE INSTRUCTIONS  Have your blood pressure rechecked as directed by your health care provider.   Take medicines only as directed by your health care provider. Follow the directions carefully. Blood pressure medicines must be taken as prescribed. The medicine does not work as well when you skip doses. Skipping doses also puts you at risk for problems.   Do not smoke.   Monitor your blood pressure at home as directed by your health care provider. SEEK MEDICAL CARE IF:   You think you are having a reaction to medicines taken.  You have recurrent headaches or feel dizzy.  You have swelling in your ankles.  You have trouble with your vision. SEEK IMMEDIATE MEDICAL CARE IF:  You develop a severe headache or confusion.  You have unusual  weakness, numbness, or feel faint.  You have severe chest or abdominal pain.  You vomit repeatedly.  You have trouble breathing. MAKE SURE YOU:   Understand these instructions.  Will watch your condition.  Will get help right away if you are not doing well or get worse. Document Released: 05/02/2005 Document Revised: 09/16/2013 Document Reviewed: 02/22/2013 [facility] Patient Information 2015 [place], [place]. This information is not intended to replace advice given to you by your health care provider. Make sure you discuss any questions you have with your health care provider.  Pulmonary Edema Pulmonary edema (PE) is a condition in which fluid  collects in the lungs. This makes it hard to breathe. PE may be a result of the heart not pumping very well or a result of injury.  CAUSES   Coronary artery disease causes blockages in the arteries of the heart. This deprives the heart muscle of oxygen and weakens the muscle. A heart attack is a form of coronary artery disease.  High blood pressure causes the heart muscle to work harder than usual. Over time, the heart muscle may get stiff, and it starts to work less efficiently. It may also fatigue and weaken.  Viral infection of the heart (myocarditis) may weaken the heart muscle.  Metabolic conditions such as thyroid disease, excessive alcohol use, certain vitamin deficiencies, or diabetes may also weaken the heart muscle.  Leaky or stiff heart valves may impair normal heart function.  Lung disease may strain the heart muscle.  Excessive demands on the heart such as too much salt or fluid intake.  Failure to take prescribed medicines.  Lung injury from heat or toxins, such as poisonous gas.  Infection in the lungs or other parts of the body.  Fluid overload caused by kidney failure or medicines. SYMPTOMS   Shortness of breath at rest or with exertion.  Grunting, wheezing, or gurgling while breathing.  Feeling like you cannot get enough air.  Breaths are shallow and fast.  A lot of coughing with frothy or bloody mucus.  Skin may become cool, damp, and turn a pale or bluish color. DIAGNOSIS  Initial diagnosis may be based on your history, symptoms, and a physical examination. Additional tests for PE may include:  Electrocardiography.  Chest X-ray.  Blood tests.  Stress test.  Ultrasound evaluation of the heart (echocardiography).  Evaluation by a heart doctor (cardiologist).  Test of the heart arteries to look for blockages (angiography).  Check of blood oxygen. TREATMENT  Treatment of PE will depend on the underlying cause and will focus first on relieving  the symptoms.   Extra oxygen to make breathing easier and assist with removing mucus. This may include breathing treatments or a tube into the lungs and a breathing machine.  Medicine to help the body get rid of extra water, usually through an IV tube.  Medicine to help the heart pump better.  If poor heart function is the cause, treatment may include:  Procedures to open blocked arteries, repair damaged heart valves, or remove some of the damaged heart muscle.  A pacemaker to help the heart pump with less effort. HOME CARE INSTRUCTIONS   Your health care provider will help you determine what type of exercise program may be helpful. It is important to maintain strength and increase it if possible. Pace your activities to avoid shortness of breath or chest pain. Rest for at least 1 hour before and after meals. Cardiac rehabilitation programs are available in some locations.  Eat a  heart-healthy diet low in salt, saturated fat, and cholesterol. Ask for help with choices.  Make a list of every medicine, vitamin, or herbal supplement you are taking. Keep the list with you at all times. Show it to your health care provider at every visit and before starting a new medicine. Keep the list up to date.  Ask your health care provider or pharmacist to help you write a plan or schedule so that you know things about each medicine such as:  Why you are taking it.  The possible side effects.  The best time of day to take it.  Foods to take with it or avoid.  When to stop taking it.  Record your hospital or clinic weight. When you get home, compare it to your scale and record your weight. Then, weigh yourself first thing in the morning daily, and record the weights. You should weigh yourself every morning after you urinate and before you eat breakfast. Wear the same amount of clothing each time you weigh yourself. Provide your health care provider with your weight record. Daily weights are important  in the early recognition of excess fluid. Tell your health care provider right away if you have gained 03 lb/1.4 kg in 1 day, 05 lb/2.3 kg in a week, or as directed by your health care provider. Your medicines may need to be adjusted.  Blood pressure monitoring should be done as often as directed. You can get a home blood pressure cuff at your drugstore. Record these values and bring them with you for your clinic visits. Notify your health care provider if you become dizzy or light-headed when standing up.  If you are currently a smoker, it is time to quit. Nicotine makes your heart work harder and is one of the leading causes of cardiac deaths. Do not use nicotine gum or patches before talking to your doctor.  Make a follow-up appointment with your health care provider as directed.  Ask your health care provider for a copy of your latest heart tracing (ECG) and keep a copy with you at all times. SEEK IMMEDIATE MEDICAL CARE IF:   You have severe chest pain, especially if the pain is crushing or pressure-like and spreads to the arms, back, neck, or jaw. THIS IS AN EMERGENCY. Do not wait to see if the pain will go away. Call for local emergency medical help. Do not drive yourself to the hospital.  You have sweating, feel sick to your stomach (nauseous), or are experiencing shortness of breath.  Your weight increases by 03 lb/1.4 kg in 1 day or 05 lb/2.3 kg in a week.  You notice increasing shortness of breath that is unusual for you. This may happen during rest, sleep, or with activity.  You develop chest pain (angina) or pain that is unusual for you.  You notice more swelling in your hands, feet, ankles, or abdomen.  You notice lasting (persistent) dizziness, blurred vision, headache, or unsteadiness.  You begin to cough up bloody mucus (sputum).  You are unable to sleep because it is hard to breathe.  You begin to feel a "jumping" or "fluttering" sensation (palpitations) in the chest  that is unusual for you. MAKE SURE YOU:  Understand these instructions.  Will watch your condition.  Will get help right away if you are not doing well or get worse. Document Released: 07/23/2002 Document Revised: 05/07/2013 Document Reviewed: 01/07/2013 [facility] Patient Information 2015 [place], [place]. This information is not intended to replace advice given to  you by your health care provider. Make sure you discuss any questions you have with your health care provider.

## 2014-01-25 NOTE — Procedures (Signed)
I was present at this dialysis session, have reviewed the session itself and made  appropriate changes  Kelly Splinter MD (pgr) [phone]    [phone] 01/25/2014, 11:54 AM

## 2014-01-29 ENCOUNTER — Ambulatory Visit (INDEPENDENT_AMBULATORY_CARE_PROVIDER_SITE_OTHER): Payer: Medicare Other | Admitting: Cardiovascular Disease

## 2014-01-29 ENCOUNTER — Encounter: Payer: Self-pay | Admitting: Neurology

## 2014-01-29 ENCOUNTER — Encounter: Payer: Self-pay | Admitting: Cardiovascular Disease

## 2014-01-29 ENCOUNTER — Ambulatory Visit (INDEPENDENT_AMBULATORY_CARE_PROVIDER_SITE_OTHER): Payer: Medicare Other | Admitting: Neurology

## 2014-01-29 VITALS — BP 140/100 | HR 77 | Resp 16 | Ht 72.0 in | Wt 165.7 lb

## 2014-01-29 VITALS — BP 130/83 | HR 75 | Ht 72.0 in | Wt 165.0 lb

## 2014-01-29 DIAGNOSIS — R569 Unspecified convulsions: Secondary | ICD-10-CM

## 2014-01-29 DIAGNOSIS — F17200 Nicotine dependence, unspecified, uncomplicated: Secondary | ICD-10-CM

## 2014-01-29 DIAGNOSIS — I4891 Unspecified atrial fibrillation: Secondary | ICD-10-CM

## 2014-01-29 DIAGNOSIS — I480 Paroxysmal atrial fibrillation: Secondary | ICD-10-CM

## 2014-01-29 DIAGNOSIS — I509 Heart failure, unspecified: Secondary | ICD-10-CM

## 2014-01-29 DIAGNOSIS — I10 Essential (primary) hypertension: Secondary | ICD-10-CM

## 2014-01-29 DIAGNOSIS — Z720 Tobacco use: Secondary | ICD-10-CM

## 2014-01-29 DIAGNOSIS — I5032 Chronic diastolic (congestive) heart failure: Secondary | ICD-10-CM

## 2014-01-29 DIAGNOSIS — F1910 Other psychoactive substance abuse, uncomplicated: Secondary | ICD-10-CM

## 2014-01-29 DIAGNOSIS — F1410 Cocaine abuse, uncomplicated: Secondary | ICD-10-CM

## 2014-01-29 MED ORDER — TOPIRAMATE 25 MG PO TABS: 25.0000 mg | ORAL_TABLET | Freq: Every day | ORAL | Status: DC

## 2014-01-29 MED ORDER — CARVEDILOL 25 MG PO TABS: 37.5000 mg | ORAL_TABLET | Freq: Two times a day (BID) | ORAL | Status: DC

## 2014-01-29 NOTE — Discharge Summary (Signed)
Name: Jeremy Mccoy MRN: [id number] DOB: Jan 21, 1966 48 y.o. PCP: Leamon Arnt, MD  Date of Admission: 01/21/2014  4:55 AM Date of Discharge: 01/25/2014 Attending Physician: Dr. Marinda Elk   Discharge Diagnosis: 1. Hypertensive emergency (resolved), Hypertension 2. Acute respiratory failure with hypoxia due to pulmonary edema, resolved  3. Atrial fibrillation with RVR  4. Tobacco abuse/Cocaine abuse  5. ESRD on hemodialysis TThSat  6. Unspecified constipation  7. Seizure disorder  8. GERD  9. Hyperkalemia 10. Thrombocytopenia  11. 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     amLODipine 10 MG tablet  Commonly known as:  NORVASC  Take 1 tablet (10 mg total) by mouth daily.     aspirin 81 MG chewable tablet  Chew 1 tablet (81 mg total) by mouth daily.     b complex vitamins tablet  Take 1 tablet by mouth daily.     cloNIDine 0.2 MG tablet  Commonly known as:  CATAPRES  Take 1 tablet (0.2 mg total) by mouth 2 (two) times daily.     diphenhydrAMINE 25 MG tablet  Commonly known as:  BENADRYL  Take 25 mg by mouth every 6 (six) hours as needed for itching.     famotidine 20 MG tablet  Commonly known as:  PEPCID  Take 1 tablet (20 mg total) by mouth daily.     feeding supplement (NEPRO CARB STEADY) Liqd  Take 237 mLs by mouth 3 (three) times daily as needed (Supplement).     guaiFENesin-dextromethorphan 100-10 MG/5ML syrup  Commonly known as:  ROBITUSSIN DM  Take 5 mLs by mouth every 4 (four) hours as needed for cough.     hydrOXYzine 25 MG tablet  Commonly known as:   ATARAX/VISTARIL  Take 25 mg by mouth 3 (three) times daily. itching     losartan 100 MG tablet  Commonly known as:  COZAAR  Take 100 mg by mouth at bedtime.     multivitamin Tabs tablet  Take 1 tablet by mouth at bedtime.     nicotine 21 mg/24hr patch  Commonly known as:  NICODERM CQ - dosed in mg/24 hours  Place 1 patch (21 mg total) onto the skin daily.     pantoprazole 40 MG tablet  Commonly known as:  PROTONIX  Take 1 tablet (40 mg total) by mouth daily.     sevelamer carbonate 800 MG tablet  Commonly known as:  RENVELA  Take 2 tablets (1,600 mg total) by mouth 3 (three) times daily with meals.     VELPHORO 500 MG chewable tablet  Generic drug:  sucroferric oxyhydroxide  Chew 1,000 mg by mouth 3 (three) times daily with meals.      Addendum to medications.  >>>Discharged on Coreg (home dose) 25 mg bid on non dialysis days and 25 daily on non dialysis days -Patient saw Cardiology 01/29/14 and increased Coreg to 37.5 mg bid   >>>Of note Keppra was continued initially 500 mg bid on discharge but after  Neurology appt 01/29/14 Keppra was discontinued and patient placed on Topamax for headache prophylaxis.    Disposition and follow-up:   Mr.Taedyn D Aloi was discharged from [facility] in stable condition.  At the hospital follow up visit please address:  1.   -renal please address headaches associated with dialysis and binders (which binder should patient be on) ->>>There is article about dialysis headache ISSN [phone] by Dr. Naaman Plummer that states treatment options may be Topiramate, Nortriptyline, Magnesium and headache prevention and treated acutely at dialysis with Chlorpromazine. Other agents tried Benadryl, Reglan, and dihydroergotamine (DHE) -Ensure patient follows up and is compliant with medications  -Address substance abuse and outpatient follow up  -consider outpatient pulmonary function tests  2.  Labs / imaging needed at time of follow-up:    -none  3.  Pending labs/ test needing follow-up:  -none   Follow-up Appointments: Follow-up Information   Follow up with ANDY,CAMILLE L, MD On 02/03/2014. (hospital follow-up 1:30; please call if you cannot make the appointment)    Specialty:  Family Medicine   Contact information:   [place] [address] [phone]       Follow up with Jim Like, Larkin Ina, DO On 01/29/2014. (8:45 )    Specialty:  Neurology   Contact information:   [address] [place] [address] [phone]       Discharge Instructions: Discharge Instructions   Discharge instructions    Complete by:  As directed   Please see follow up appointments and follow up Thank you Take care  Take medications as prescribed     Increase activity slowly    Complete by:  As directed            Consultations: Treatment Team: [name], MD-renal  Rounding Lbcardiology, MD Dr. Radford Pax, Dr. Sallyanne Kuster Critical Care-Dr. Lake Bells   Procedures Performed:  Dg Chest 2 View  01/24/2014   CLINICAL DATA:  Respiratory failure  EXAM: CHEST  2 VIEW  COMPARISON:  January 22, 2014  FINDINGS: There has been interval clearing of interstitial edema. There is atelectatic change in the right and left lung bases. Elsewhere, the lungs are clear. The heart is mildly enlarged with pulmonary vascularity within normal limits. There is moderate peribronchial thickening.  No adenopathy.  No bone lesions.  IMPRESSION: Currently no edema or consolidation. There is central interstitial thickening consistent with a degree of bronchitis. There is patchy bibasilar atelectatic change. Heart is mildly enlarged.   Electronically Signed   By: Lowella Grip M.D.   On: 01/24/2014 08:48   Dg Chest Port 1 View  01/22/2014   CLINICAL DATA:  Shortness of breath  EXAM: PORTABLE CHEST - 1 VIEW  COMPARISON:  01/21/2014  FINDINGS: Cardiac shadow remains enlarged. Increasing left retrocardiac infiltrate and  right basilar infiltrate is noted. Persistent opacities are noted consistent with pulmonary edema. No pneumothorax is seen.  IMPRESSION: Persistent changes of pulmonary edema with superimposed bibasilar infiltrates left greater than right.   Electronically Signed   By: Inez Catalina M.D.   On: 01/22/2014 07:49   Dg Chest Port 1 View  01/21/2014   CLINICAL DATA:  Shortness of breath  EXAM: PORTABLE CHEST - 1 VIEW  COMPARISON:  Prior radiograph from 11/04/2013  FINDINGS: Cardiomegaly is stable from prior study. Mediastinal silhouette within normal limits.  Lungs are normally inflated. Extensive vascular congestion with indistinctness of the interstitial markings is most consistent with diffuse pulmonary edema. More confluent opacities  are present within the right upper and lower lobes, which may reflect superimposed infection. No pneumothorax. Probable tiny bilateral pleural effusions present.  No acute osseous abnormality.  IMPRESSION: 1. Cardiomegaly with findings suggestive of moderate diffuse pulmonary edema. More confluent opacities within the right upper and lower lobes may reflect superimposed infection in the correct clinical setting. 2. Probable tiny bilateral pleural effusions.   Electronically Signed   By: Jeannine Boga M.D.   On: 01/21/2014 05:17   01/23/14 LV EF: 50% - 55%  ------------------------------------------------------------------- Indications: Atrial fibrillation - 427.31.  ------------------------------------------------------------------- History: PMH: History of pulmonary edema. Cocaine abuse. Risk factors: Current tobacco use. Hypertension.  ------------------------------------------------------------------- Study Conclusions  - Left ventricle: The cavity size was mildly dilated. Wall thickness was increased in a pattern of moderate LVH. Systolic function was normal. The estimated ejection fraction was in the range of 50% to 55%. Wall motion was normal; there were  no regional wall motion abnormalities. Doppler parameters are consistent with abnormal left ventricular relaxation (grade 1 diastolic dysfunction). - Mitral valve: There was mild regurgitation. - Left atrium: The atrium was mildly dilated. - Pericardium, extracardiac: A small pericardial effusion was identified.  Impressions:  - Low normal LV function; moderate LVH; grade 1 diastolic dysfunction; mild LAE; mild MR; small pericardial effusion with fibrinous strands noted. Compared to 11/05/13, pericardial effusion is slightly larger.  Transthoracic echocardiography. M-mode, complete 2D, spectral Doppler, and color Doppler. Birthdate: Patient birthdate: 01-27-1966. Age: Patient is 48 yr old. Sex: Gender: male. BMI: 19.1 kg/m^2. Blood pressure: 125/68 Patient status: Inpatient. Study date: Study date: 01/23/2014. Study time: 12:00 PM. Location: Bedside.  -------------------------------------------------------------------  ------------------------------------------------------------------- Left ventricle: The cavity size was mildly dilated. Wall thickness was increased in a pattern of moderate LVH. Systolic function was normal. The estimated ejection fraction was in the range of 50% to 55%. Wall motion was normal; there were no regional wall motion abnormalities. Doppler parameters are consistent with abnormal left ventricular relaxation (grade 1 diastolic dysfunction).  ------------------------------------------------------------------- Aortic valve: Trileaflet; normal thickness leaflets. Mobility was not restricted. Doppler: Transvalvular velocity was within the normal range. There was no stenosis. There was no regurgitation.  ------------------------------------------------------------------- Aorta: Aortic root: The aortic root was normal in size.  ------------------------------------------------------------------- Mitral valve: Structurally normal valve. Mobility was  not restricted. Doppler: Transvalvular velocity was within the normal range. There was no evidence for stenosis. There was mild regurgitation. Peak gradient (D): 2 mm Hg.  ------------------------------------------------------------------- Left atrium: The atrium was mildly dilated.  ------------------------------------------------------------------- Right ventricle: The cavity size was normal. Systolic function was normal.  ------------------------------------------------------------------- Pulmonic valve: Doppler: Transvalvular velocity was within the normal range. There was no evidence for stenosis. There was trivial regurgitation.  ------------------------------------------------------------------- Tricuspid valve: Structurally normal valve. Doppler: Transvalvular velocity was within the normal range. There was trivial regurgitation.  ------------------------------------------------------------------- Right atrium: The atrium was normal in size.  ------------------------------------------------------------------- Pericardium: A small pericardial effusion was identified.  ------------------------------------------------------------------- Systemic veins: Inferior vena cava: The vessel was normal in size.  ------------------------------------------------------------------- Measurements  Left ventricle Value Reference LV ID, ED, PLAX chordal (H) 56.1 mm 43 - 52 LV ID, ES, PLAX chordal (H) 40.6 mm 23 - 38 LV fx shortening, PLAX chordal (L) 28 % >=29 LV PW thickness, ED 14.71 mm --------- IVS/LV PW ratio, ED 0.89 <=1.3 LV e&', lateral 6.8 cm/s --------- LV E/e&', lateral 10.96 --------- LV e&', medial 5.7 cm/s --------- LV E/e&', medial 13.07 --------- LV e&', average 6.25 cm/s --------- LV E/e&', average 11.92 ---------  Ventricular septum Value Reference  IVS thickness, ED 13.02 mm ---------  Aorta Value Reference Aortic root ID, ED 34 mm ---------  Left atrium  Value Reference LA ID, A-P, ES 42 mm --------- LA ID/bsa, A-P 2.12 cm/m^2 <=2.2  Mitral valve Value Reference Mitral E-wave peak velocity 74.5 cm/s --------- Mitral A-wave peak velocity 86.4 cm/s --------- Mitral deceleration time (H) 331 ms 150 - 230 Mitral peak gradient, D 2 mm Hg --------- Mitral E/A ratio, peak 0.9 ---------  Right ventricle Value Reference RV s&', lateral, S 17.5 cm/s ---------     Admission HPI:  Chief Complaint: Shortness of breath  History of Present Illness: This is a 48 year old gentleman, with past medical history of end-stage renal disease on hemodialysis TTS since July 2015, hypertension, cocaine and tobacco abuse, and medication non-compliance. He presents with worsening shortness of breath after missing dialysis for one week. He was attempting to go for his dialysis today, but he was unable and he was routed to the ED by EMS due to severe symptoms. He denies acute chest pain, fevers, chills, nausea or vomiting. His appetite has been good. He denies cough. His blood pressure was found to be severely elevated in the emergency department, and his O2 saturations, were in the 70s. Oxygen saturation improved with supplemental oxygen via BiPAP. Patient continues to report increasing shortness of breath and is providing history in breaking sentences.  Review of Systems:  Constitutional: Denies fever, chills, diaphoresis, appetite change and fatigue.  HEENT: Denies photophobia, eye pain, redness, hearing loss, ear pain, congestion, sore throat, rhinorrhea, sneezing, mouth sores, trouble swallowing, neck pain, neck stiffness and tinnitus.  Respiratory: Denies chest tightness, and wheezing.  Cardiovascular: Denies chest pain, palpitations and leg swelling.  Gastrointestinal: Denies nausea, vomiting, abdominal pain, diarrhea, constipation,blood in stool and abdominal distention.  Genitourinary: Does not make any urine  Musculoskeletal: Denies myalgias, back pain, joint  swelling, arthralgias and gait problem.  Skin: Denies pallor, rash and wound.  Neurological: Denies dizziness, seizures, syncope, weakness, lightheadedness, numbness and headaches.  Hematological: Denies adenopathy. Reports bruising at the bilateral his legs, no personal or family bleeding history  Psychiatric/Behavioral: Denies suicidal ideation, mood changes, confusion, nervousness, sleep disturbance and agitation  Physical Exam:  Blood pressure 197/117, pulse 120, temperature 97.9 F (36.6 C), temperature source Axillary, resp. rate 21, height 6\' 5"  (1.956 m), weight 164 lb 3.9 oz (74.5 kg), SpO2 99.00%.  General: well developed, well nourished; moderate respiratory distress cooperative with exam  Head: atraumatic, normocephalic,  Eye: pupils equal, round and reactive; sclera anicteric; normal conjunctiva  Nose/throat: oropharynx clear, moist mucous membranes, slightly pale gums  Neck: JVD elevated at 3-4 cm  Lungs/Chest wall: clear to auscultation bilaterally, increased work of breathing  Heart: normal rate and regular rhythm; notable for 3/6 systolic murmur in the mitral area. This might be transmitted from his AV fistula  Pulses: radial and dorsalis pedis pulses are 2+ and symmetric  Abdomen: Normal fullness, no rebound, guarding, or rigidity; normal bowel sounds; no masses or organomegaly  Skin: warm, dry, intact, normal turgor, no rashes  Extremities: no peripheral edema, clubbing, or cyanosis. 2 areas of ecchymosis on the medial aspect of both his legs  Neurologic: A&O X3, CN II - XII are grossly intact. Motor strength is 5/5 in the all 4 extremities  Psych: patient is alert and oriented, mood and affect are normal and congruent   Hospital Course by problem list: 1. Hypertensive emergency (resolved), Hypertension 2. Acute respiratory failure with hypoxia due to pulmonary edema  3. Atrial fibrillation with RVR  4. Tobacco abuse/Cocaine abuse  5. ESRD on hemodialysis TThSat  6.  Unspecified constipation  7. Seizure disorder  8. GERD  9. Hyperkalemia 10. Thrombocytopenia  11. 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  He was initially on Nicardipine drip (in the intensive care unit ICU 9/8-01/22/14) with acute hypoxic respiratory failure secondary pulmonary edema (resolved by discharge) likely secondary to uncontrolled hypertension, possibly cocaine abuse and missing 3 days of dialysis. He also had hyperkalemia on admission to 7.6 with peaked T waves on EKG which resolved with dialysis. This admission he also had multiple episodes of Atrial fibrillation with RVR.   1. Hypertensive emergency (resolved), Hypertension  Uncontrolled intermittently with hypertensive emergency on admission (BP was 201/131) which needed Nicardipine drip (9/8-01/22/14) due to blood pressure being uncontrolled even after dialysis.  He was also placed as needed intravenous hydralazine.  Blood pressure was likely uncontrolled possibly due to patient missing 3 days of dialysis, cocaine abuse, and medication noncompliance.  On day 2 of admission patient was transitioned to oral home medications for blood pressure.   At discharge continued clonidine 0.2 mg 2x per day, Cozaar 100 mg daily, Norvasc 10 mg daily, coreg (home dose) 25 mg bid on non dialysis days and 25 daily on dialysis days though patient saw cardiology 01/29/14 and Coreg dose was changed to 37.5 mg bid.     2. Acute respiratory failure with hypoxia due to pulmonary edema  Likely secondary to hypertensive emergency on admission. He was given dialysis urgently and supplemental O2 with serial repeat chest Xray's and return to baseline oxygenation and pulmonary function.    3. Atrial fibrillation with RVR  Noted this admission. CHADSVASC score is  1. Initially noted on 9/9 and he was give Metoprolol intravenously. Troponins were negative x 3.  He had additional episodes 9/11 with heart rate in the 130s-140s.  Cardiology was consulted this admission and initially recommended Diltiazem 240 CD daily which he was given for 1 day.  The next day cardiology saw the patient again and recommended discontinue Diltiazem CD 240 mg daily and continue Coreg (home dose 25 on HD days and 25 bid on non HD days) and Norvasc 10 mg (home dose) as well as continue Aspirin 81 mg daily for anticoagulation.  It was recommended beta blockers be avoided with history of cocaine abuse and he will follow up with cardiology outpatient.    4. Tobacco abuse/Cocaine abuse  Seen by social work this admission for outpatient options.  Encouraged cessation.  Given Nicotine patch   5. ESRD on hemodialysis TThSat  Associated with anemia of chronic disease normocytic anemia with baseline hemoglobin 7.8 and secondary hyperparathyroidism.  He is getting erythropoeitin stimulating agents by renal on Tuesdays.  ESRD also associated with anion gap metabolic acidosis.  Continued Renvela, Velphoro.  He follows outpatient with Dr. Mercy Moore  6. Unspecified constipation  Senna-S given this admission   7. Seizure disorder  No seizures this admission continued Keppra 500 mg bid at discharge.  Seizure precautions.  He will followed up with Neurology outpatient on 9/16 who discontinued Keppra and put him on Topamax for headache prophylaxis.   8. GERD  Protonix, Pepcid continued.   9. Hyperkalemia Potassium was 7.6 on admission with peaked T waves and he urgently had dialysis.  By discharge  potassium decreased to 5.3.    10. Thrombocytopenia  Noted on admission 120.  Etiology unclear platelets recovered by discharge.   11. Headache associated with dialysis  Headaches do not seem like migraines.  They are chronically associated after/during dialysis treatments.  There is article about  dialysis headache ISSN [phone] by Dr. Naaman Plummer that states treatment options may be Topiramate, Nortriptyline, Magnesium and headache prevention and treated acutely at dialysis with Chlorpromazine. 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   He had compression devices for DVT prophylaxis.       Discharge Vitals:   BP 153/100  Pulse 98  Temp(Src) 98.8 F (37.1 C) (Oral)  Resp 20  Ht 6\' 5"  (1.956 m)  Wt 158 lb 1.1 oz (71.7 kg)  BMI 18.74 kg/m2  SpO2 96%  Discharge physical exam: General: resting in bed, NAD  HEENT: [place]/at, no scleral icterus  Cardiac: RRR, no rubs, murmurs or gallops  Pulm: clear to auscultation bilaterally, no wheezes, rales, or rhonchi  Abd: soft, nontender, nondistended, BS present  Ext: warm and well perfused, no pedal edema  Neuro: alert and oriented X3, cranial nerves II-XII grossly intact  Discharge Labs:  Results for [facility] (MRN [id number]) as of 01/29/2014 20:06  Ref. Range 01/21/2014 05:33 01/21/2014 06:27 01/21/2014 07:34 01/21/2014 07:35  Sample type No range found  VENOUS ARTERIAL   pH, Arterial Latest Range: 7.350-7.450    7.332 (L)   pCO2 arterial Latest Range: 35.0-45.0 mmHg   34.5 (L)   pO2, Arterial Latest Range: 80.0-100.0 mmHg   391.0 (H)   pH, Ven Latest Range: 7.250-7.300   7.240 (L)    pCO2, Ven Latest Range: 45.0-50.0 mmHg  35.9 (L)    pO2, Ven Latest Range: 30.0-45.0 mmHg  54.0 (H)    Bicarbonate Latest Range: 20.0-24.0 mEq/L  15.4 (L) 18.2 (L)   TCO2 Latest Range: 0-100 mmol/L 16 16 19 18   Acid-base deficit Latest Range: 0.0-2.0 mmol/L  11.0 (H) 7.0 (H)   O2 Saturation No range found  82.0 100.0   Patient temperature No range found   98.6 F   Collection site No range found   RADIAL, ALLEN'S TEST ACCEPTABLE    Results for LADANIEL, LEFEBURE (MRN [id number]) as of  01/29/2014 20:06  Ref. Range 01/22/2014 22:01 01/23/2014 05:20 01/23/2014 05:36 01/24/2014 11:46 01/25/2014 08:10  Sodium Latest Range: 137-147 mEq/L   135 (L) 137 139  Potassium Latest Range: 3.7-5.3 mEq/L   4.9 4.2 5.3  Chloride Latest Range: 96-112 mEq/L   88 (L) 92 (L) 95 (L)  CO2 Latest Range: 19-32 mEq/L   25 26 21   BUN Latest Range: 6-23 mg/dL   63 (H) 57 (H) 83 (H)  Creatinine Latest Range: 0.50-1.35 mg/dL   11.08 (H) 9.49 (H) 12.12 (H)  Calcium Latest Range: 8.4-10.5 mg/dL   9.6 9.7 9.6  GFR calc non Af Amer Latest Range: >90 mL/min   5 (L) 6 (L) 4 (L)  GFR calc Af Amer Latest Range: >90 mL/min   6 (L) 7 (L) 5 (L)  Glucose Latest Range: 70-99 mg/dL   106 (H) 124 (H) 99  Anion gap Latest Range: 5-15    22 (H) 19 (H) 23 (H)  Phosphorus Latest Range: 2.3-4.6 mg/dL    6.4 (H) 9.0 (H)  Magnesium Latest Range: 1.5-2.5 mg/dL  2.3  2.3  Albumin Latest Range: 3.5-5.5 g/dL    3.5 3.4 (L)  Troponin I Latest Range: <0.30 ng/mL <0.30       Results for DAQUANN, DAZA (MRN [id number]) as of 01/29/2014 20:06  Ref. Range 01/21/2014 07:35 01/21/2014 16:05 01/21/2014 20:33 01/22/2014 02:25 01/22/2014 12:10 01/22/2014 16:00 01/22/2014 22:01 01/23/2014 05:20  Sodium Latest Range: 137-147 mEq/L 131 (L) 138  137      Potassium Latest Range: 3.7-5.3 mEq/L 5.6 (H) 4.1  5.3      Chloride Latest Range: 96-112 mEq/L 103 91 (L)  92 (L)      CO2 Latest Range: 19-32 mEq/L  21  25      BUN Latest Range: 6-23 mg/dL >140 (H) 60 (H)  69 (H)      Creatinine Latest Range: 0.50-1.35 mg/dL >18.00 (H) 11.58 (H)  13.60 (H)      Calcium Latest Range: 8.4-10.5 mg/dL  10.2  9.4      GFR calc non Af Amer Latest Range: >90 mL/min  5 (L)  4 (L)      GFR calc Af Amer Latest Range: >90 mL/min  5 (L)  4 (L)      Glucose Latest Range: 70-99 mg/dL 119 (H) 97  104 (H)      Anion gap Latest Range: 5-15   26 (H)  20 (H)      Calcium Ionized Latest Range: 1.12-1.32 mmol/L 1.21         Magnesium Latest Range: 1.5-2.5 mg/dL        2.3  Alkaline  Phosphatase Latest Range: 39-117 U/L    58      Albumin Latest Range: 3.5-5.5 g/dL    3.4 (L)      AST Latest Range: 0-37 U/L    9      ALT Latest Range: 0-53 U/L    9      Total Protein Latest Range: 6.0-8.3 g/dL    6.9      Total Bilirubin Latest Range: 0.3-1.2 mg/dL    0.4      Troponin I Latest Range: <0.30 ng/mL  <0.30 <0.30  <0.30 <0.30 <0.30   Lactic Acid, Venous Latest Range: 0.5-2.2 mmol/L  0.9         Results for TEX, HEIMER (MRN [id number]) as of 01/29/2014 20:06  Ref. Range 01/21/2014 05:23 01/21/2014 05:31 01/21/2014 05:33 01/21/2014 07:35 01/21/2014 16:05  Sodium Latest Range: 137-147 mEq/L 134 (L)  130 (L) 131 (L) 138  Potassium Latest Range: 3.7-5.3 mEq/L 7.6 (HH)  7.1 (HH) 5.6 (H) 4.1  Chloride Latest Range: 96-112 mEq/L 88 (L)  104 103 91 (L)  CO2 Latest Range: 19-32 mEq/L 15 (L)    21  BUN Latest Range: 6-23 mg/dL 167 (H)  >140 (H) >140 (H) 60 (H)  Creatinine Latest Range: 0.50-1.35 mg/dL 26.52 (H)  >18.00 (H) >18.00 (H) 11.58 (H)  Calcium Latest Range: 8.4-10.5 mg/dL 9.7    10.2  GFR calc non Af Amer Latest Range: >90 mL/min 2 (L)    5 (L)  GFR calc Af Amer Latest Range: >90 mL/min 2 (L)    5 (L)  Glucose Latest Range: 70-99 mg/dL 98  95 119 (H) 97  Anion gap Latest Range: 5-15  31 (H)    26 (H)  Calcium Ionized Latest Range: 1.12-1.32 mmol/L   1.08 (L) 1.21   Alkaline Phosphatase Latest Range: 39-117 U/L 70      Albumin Latest Range: 3.5-5.5 g/dL 4.1  Lipase Latest Range: 11-59 U/L 24      AST Latest Range: 0-37 U/L 15      ALT Latest Range: 0-53 U/L 13      Total Protein Latest Range: 6.0-8.3 g/dL 7.4      Total Bilirubin Latest Range: 0.3-1.2 mg/dL 0.4      Troponin I Latest Range: <0.30 ng/mL     <0.30  Troponin i, poc Latest Range: 0.00-0.08 ng/mL  0.04     Lactic Acid, Venous Latest Range: 0.5-2.2 mmol/L     0.9   Results for ADITYA, SZALKOWSKI (MRN [id number]) as of 01/29/2014 20:06  Ref. Range 01/21/2014 05:23 01/21/2014 05:33 01/21/2014 07:35 01/22/2014 02:25  01/23/2014 05:36 01/24/2014 11:46 01/25/2014 08:09  WBC Latest Range: 4.0-10.5 K/uL 5.0   4.5 5.0 3.8 (L) 4.0  RBC Latest Range: 4.22-5.81 MIL/uL 2.25 (L)   2.60 (L) 2.48 (L) 2.40 (L) 2.33 (L)  Hemoglobin Latest Range: 13.0-17.0 g/dL 7.2 (L) 8.2 (L) 6.8 (LL) 8.2 (L) 8.0 (L) 7.4 (L) 7.4 (L)  HCT Latest Range: 39.0-52.0 % 21.1 (L) 24.0 (L) 20.0 (L) 24.2 (L) 23.8 (L) 22.5 (L) 22.1 (L)  MCV Latest Range: 78.0-100.0 fL 93.8   93.1 96.0 93.8 94.8  MCH Latest Range: 26.0-34.0 pg 32.0   31.5 32.3 30.8 31.8  MCHC Latest Range: 30.0-36.0 g/dL 34.1   33.9 33.6 32.9 33.5  RDW Latest Range: 11.5-15.5 % 15.6 (H)   15.3 14.8 14.7 14.8  Platelets Latest Range: 150-400 K/uL 120 (L)   110 (L) 119 (L) 158 167  Neutrophils Relative % Latest Range: 43-77 % 75        Lymphocytes Relative Latest Range: 12-46 % 18        Monocytes Relative Latest Range: 3-12 % 7        Eosinophils Relative Latest Range: 0-5 % 0        Basophils Relative Latest Range: 0-1 % 0        NEUT# Latest Range: 1.7-7.7 K/uL 3.7        Lymphocytes Absolute Latest Range: 0.7-4.0 K/uL 0.9        Monocytes Absolute Latest Range: 0.1-1.0 K/uL 0.3        Eosinophils Absolute Latest Range: 0.0-0.7 K/uL 0.0        Basophils Absolute Latest Range: 0.0-0.1 K/uL 0.0         Results for MARSHA, VENESS (MRN [id number]) as of 01/29/2014 20:06  Ref. Range 01/22/2014 02:25  Prothrombin Time Latest Range: 11.6-15.2 seconds 17.8 (H)  INR Latest Range: 0.00-1.49  1.47   Results for LAMEL, HAYENGA (MRN [id number]) as of 01/29/2014 20:06  Ref. Range 01/25/2014 08:09  TSH Latest Range: 0.350-4.500 uIU/mL 2.890   Results for TOBIN, ROGALSKI (MRN [id number]) as of 01/29/2014 20:06  Ref. Range 01/24/2014 11:46  HIV Latest Range: NON REACTIVE  NONREACTIVE   Results for LENOXX, CASSADA (MRN [id number]) as of 01/29/2014 20:06  Ref. Range 01/21/2014 15:04  MRSA by PCR Latest Range: NEGATIVE  NEGATIVE    Signed: Cresenciano Genre, MD 01/29/2014, 9:20 PM     Services Ordered on Discharge: none Equipment Ordered on Discharge: none

## 2014-01-29 NOTE — Patient Instructions (Signed)
Overall you are doing fairly well but I do want to suggest a few things today:   Remember to drink plenty of fluid, eat healthy meals and do not skip any meals. Try to eat protein with a every meal and eat a healthy snack such as fruit or nuts in between meals. Try to keep a regular sleep-wake schedule and try to exercise daily, particularly in the form of walking, 20-30 minutes a day, if you can.   As far as your medications are concerned, I would like to suggest the following: 1)Please stop the Keppra 2)Please start Topamax 25mg  nightly  Please follow up in 4 months with Dr Krista Blue, sooner if we need to. Please call us with any interim questions, concerns, problems, updates or refill requests.   My clinical assistant and will answer any of your questions and relay your messages to me and also relay most of my messages to you.   Our phone number is [phone]. We also have an after hours call service for urgent matters and there is a physician on-call for urgent questions. For any emergencies you know to call 911 or go to the nearest emergency room

## 2014-01-29 NOTE — Progress Notes (Signed)
[facility]    Provider:  Dr Janann Colonel Referring Provider: Leamon Arnt, MD Primary Care Physician:  Leamon Arnt, MD  CC:  seizures  HPI:  Jeremy Mccoy is a 48 y.o. male here as a referral from Dr. Jonni Sanger for seizure follow up. Last visit was 07/2013. He was recently in the hospital after missing 3 scheduled HD visits. Was admitted to hospital for 5 days because of this. Notes while he was in the hospital he had a few runs of A Fib. He does note sensation of palpitations in the past. Is scheduled to see cardiology today.  Remains on keppra 500mg  BID. No further seizure activity. His main concern today is headache associated with dialysis. Notes severe pressure type headache that starts with dialysis and can continue for hours or days after. Gets weak all over with these episodes.    Initial visit 07/2013: Patient was hospitalized on 2/25 for status epilepticus after being found down by his family. Had MRI findings consistent with diagnosis of PRES, he was intubated and started on IV keppra and dilantin, along with IV propfol and pentobarbital as needed. Was discharged with Keppra 500mg  BID and Dilantin 600mg  daily. States he feels his symptoms were due to poor compliance of his HD, not eating properly. States that the day of his seizure event he had taken 3 to 5 percocet at once for severe pain. States he has been cocaine free for over 1 month, has not used opiates since prior to hospital discharge.   States he has severe headaches and needs more Percocet. States he had some from the hospital and from his PCP but ran out of it. He states he "obtained some illegally" and that has helped his pain.   Neuor-hospitalist consult 07/10/2013: Jeremy Mccoy is an 48 y.o. male with a past medical history significant for cocaine abuse, ESRD, found unresponsive and seizing by family earlier today, EMS summoned and patient brought to the ED where he was intubated and continues seizing  despite receiving lorazepam 2 mg, 4 mg midazolam, 1 gram loading dose dilantin, and propofol.  CT brain showed no acute abnormality.  No further clinical history available at this time.  MRI brain reviewed showing signs of questionable PRES  Review of Systems: Out of a complete 14 system review, the patient complains of only the following symptoms, and all other reviewed systems are negative. + blurred vision, loss of vision, confusion, headache, numbness, dizziness, seizure   History   Social History  . Marital Status: Single    Spouse Name: N/A    Number of Children: 0  . Years of Education: 9 th   Occupational History  . floor insulation     Disabled   Social History Main Topics  . Smoking status: Current Every Day Smoker -- 1.00 packs/day for 35 years    Types: Cigars, Cigarettes  . Smokeless tobacco: Never Used  . Alcohol Use: No     Comment: 05/15/2013 "aien't drank in ~ 25 yrs"  . Drug Use: Yes    Special: Marijuana, Cocaine     Comment: Precription drugs (e.g. percocet) "I take what I have to to controll my constant pain" (05/15/2013)  . Sexual Activity: No   Other Topics Concern  . Not on file   Social History Narrative   ** Merged History Encounter **   Patient lives at home with his parents and he is disabled.   Education 9th grade education   Right handed  Caffeine one cup daily        Family History  Problem Relation Age of Onset  . Heart attack Father     Past Medical History  Diagnosis Date  . COPD (chronic obstructive pulmonary disease)   . Crack cocaine use   . Dental caries   . HTN (hypertension) 06/12/2012  . Hematemesis/vomiting blood   . Shortness of breath     "just when I have too much potassium is too high" (05/15/2013)  . Anemia   . History of blood transfusion 10/2011; 04/2013  . Headache(784.0)     "get it from going to dialysis; when they get real bad I come to hospital" (05/15/2013)  . Arthritis     "qwhere" (05/15/2013)  .  End stage renal disease 01/11/2012    Patient presented 11/04/11 to hospital with CP, wt loss and N/V. Creat was 10, admitted. 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 ANA was + 1:80, +MPO and +pr3 Ab's (ANCA). Received plasmapheresis x 7, IV cytoxan and pred taper. First HD was 11/17/11. 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 Pt d/c'd to do outpt HD and may have gotten 1-2 additional Cytoxan as OP, but this was stopped eventually since renal function didn't recover. Now gets HD TTS schedule at [facility].  L forearm AVF 11/19/11 by Dr. Scot Dock is current access.    Marland Kitchen ESRD (end stage renal disease) on dialysis     TTS: [address] [place] (05/15/2013)  . ESRD (end stage renal disease) on dialysis   . Headache(784.0)   . Depression   . Anxiety     Past Surgical History  Procedure Laterality Date  . Av fistula placement  11/29/2011    Procedure: ARTERIOVENOUS (AV) FISTULA CREATION;  Surgeon: Angelia Mould, MD;  Location: [place];  Service: Vascular;  Laterality: Left;  . Renal biopsy  11/07/2011  . Inguinal hernia repair Bilateral 1967  . Esophagogastroduodenoscopy N/A 05/17/2013    Procedure: ESOPHAGOGASTRODUODENOSCOPY (EGD);  Surgeon: Beryle Beams, MD;  Location: [facility] END[facility]COPY;  Service: Endoscopy;  Laterality: N/A;    Current Outpatient Prescriptions  Medication Sig Dispense Refill  . acetaminophen (TYLENOL) 500 MG tablet Take 1,000 mg by mouth every 6 (six) hours as needed for mild pain.       Marland Kitchen amLODipine (NORVASC) 10 MG tablet Take 1 tablet (10 mg total) by mouth daily.  30 tablet  1  . aspirin 81 MG chewable tablet Chew 1 tablet (81 mg total) by mouth daily.  30 tablet  11  . b complex vitamins tablet Take 1 tablet by mouth daily.      . carvedilol (COREG) 25 MG tablet Take 1 tablet (25 mg total) by mouth 2 (two) times daily with a meal. On  Monday-Wednesday-Friday-Sunday (non-HD days); on dialysis days (Tuesday-Thursday-Saturday) take it once a day (evening time only).  60 tablet  1  . cinacalcet (SENSIPAR) 30 MG tablet Take 30 mg by mouth daily.      . cloNIDine (CATAPRES) 0.2 MG tablet Take 1 tablet (0.2 mg total) by mouth 2 (two) times daily.  60 tablet  2  . diphenhydrAMINE (BENADRYL) 25 MG tablet Take 25 mg by mouth every 6 (six) hours as needed for itching.      . famotidine (PEPCID) 20 MG tablet Take 1 tablet (20 mg total) by mouth daily.  30 tablet  1  .  guaiFENesin-dextromethorphan (ROBITUSSIN DM) 100-10 MG/5ML syrup Take 5 mLs by mouth every 4 (four) hours as needed for cough.  118 mL  0  . hydrOXYzine (ATARAX/VISTARIL) 25 MG tablet Take 25 mg by mouth 3 (three) times daily. itching      . levETIRAcetam (KEPPRA) 500 MG tablet Take 1 tablet (500 mg total) by mouth 2 (two) times daily.  60 tablet  3  . losartan (COZAAR) 100 MG tablet Take 100 mg by mouth at bedtime.      . multivitamin (RENA-VIT) TABS tablet Take 1 tablet by mouth at bedtime.      . nicotine (NICODERM CQ - DOSED IN MG/24 HOURS) 21 mg/24hr patch Place 1 patch (21 mg total) onto the skin daily.  28 patch  0  . Nutritional Supplements (FEEDING SUPPLEMENT, NEPRO CARB STEADY,) LIQD Take 237 mLs by mouth 3 (three) times daily as needed (Supplement).    0  . pantoprazole (PROTONIX) 40 MG tablet Take 1 tablet (40 mg total) by mouth daily.  30 tablet  1  . sevelamer carbonate (RENVELA) 800 MG tablet Take 2 tablets (1,600 mg total) by mouth 3 (three) times daily with meals.  90 tablet  0  . VELPHORO 500 MG chewable tablet Chew 1,000 mg by mouth 3 (three) times daily with meals.        No current facility-administered medications for this visit.    Allergies as of 01/29/2014  . (No Known Allergies)    Vitals: BP 130/83  Pulse 75  Ht 6' (1.829 m)  Wt 165 lb (74.844 kg)  BMI 22.37 kg/m2 Last Weight:  Wt Readings from Last 1 Encounters:  01/29/14 165 lb (74.844  kg)   Last Height:   Ht Readings from Last 1 Encounters:  01/29/14 6' (1.829 m)     Physical exam: Exam: Gen: NAD, conversant Eyes: anicteric sclerae, moist conjunctivae HENT: Atraumatic, oropharynx clear Neck: Trachea midline; supple,  Lungs: CTA, no wheezing, rales, rhonic                          CV: RRR, no MRG Abdomen: Soft, non-tender;  Extremities: No peripheral edema  Skin: Normal temperature, no rash,  Psych: Appropriate affect, pleasant  Neuro: MS: AA&Ox3, appropriately interactive, normal affect   Speech: fluent w/o paraphasic error  Memory: good recent and remote recall  CN: PERRL, EOMI no nystagmus, no ptosis, sensation intact to LT V1-V3 bilat, face symmetric, no weakness, hearing grossly intact, palate elevates symmetrically, shoulder shrug 5/5 bilat,  tongue protrudes midline, no fasiculations noted.  Motor: normal bulk and tone Strength: 5/5  In all extremities  Coord: rapid alternating and point-to-point (FNF, HTS) movements intact.  Reflexes: symmetrical, bilat downgoing toes  Sens: LT intact in all extremities with some subjective decreased LT in RLE  Gait: posture, stance, stride and arm-swing normal. Tandem gait intact. Able to walk on heels and toes. Romberg absent.   Assessment:  After physical and neurologic examination, review of laboratory studies, imaging, neurophysiology testing and pre-existing records, assessment will be reviewed on the problem list.  Plan:  Treatment plan and additional workup will be reviewed under Problem List.  1)Seizures: likely provoked 2)Headache: suspect HD related 3)Peripheral neuropathy  47y/o gentleman with history of substance abuse presenting for follow up evaluation of his seizure episode. Since last visit he was hospitalized after missing 3 HD treatments. While in the hospital noted to have runs of A fib. No further seizure episodes since solo  event. Main concern today is a post HD headache that he  describes as debilitating. We will d/c keppra and switch to Topamax for HA prophylaxis. Counseled patient to follow up with cardiology for A fib. Follow up in 4 months.    Jim Like, DO  [facility] [address] [place], [place] [address]  Phone [phone] Fax [phone]

## 2014-01-29 NOTE — Patient Instructions (Signed)
Increase Carvedilol 25mg  to 1 1/2 tablets twice a day.  A new Rx has been sent to your pharmacy.  Dr. Sallyanne Kuster recommends that you schedule a follow-up appointment in: 3 months.

## 2014-01-30 ENCOUNTER — Other Ambulatory Visit: Payer: Self-pay | Admitting: *Deleted

## 2014-01-30 MED ORDER — CARVEDILOL 25 MG PO TABS: 37.5000 mg | ORAL_TABLET | Freq: Two times a day (BID) | ORAL | Status: DC

## 2014-01-30 NOTE — Telephone Encounter (Signed)
Refills redone electronically to pharmacy.

## 2014-01-31 ENCOUNTER — Ambulatory Visit (INDEPENDENT_AMBULATORY_CARE_PROVIDER_SITE_OTHER): Payer: Medicare Other | Admitting: Cardiology

## 2014-01-31 ENCOUNTER — Encounter: Payer: Self-pay | Admitting: Cardiology

## 2014-01-31 VITALS — BP 140/88 | HR 84 | Ht 73.0 in | Wt 162.4 lb

## 2014-01-31 DIAGNOSIS — I480 Paroxysmal atrial fibrillation: Secondary | ICD-10-CM

## 2014-01-31 DIAGNOSIS — I509 Heart failure, unspecified: Secondary | ICD-10-CM

## 2014-01-31 DIAGNOSIS — J449 Chronic obstructive pulmonary disease, unspecified: Secondary | ICD-10-CM | POA: Insufficient documentation

## 2014-01-31 DIAGNOSIS — I5032 Chronic diastolic (congestive) heart failure: Secondary | ICD-10-CM | POA: Insufficient documentation

## 2014-01-31 DIAGNOSIS — N186 End stage renal disease: Secondary | ICD-10-CM

## 2014-01-31 DIAGNOSIS — I4891 Unspecified atrial fibrillation: Secondary | ICD-10-CM

## 2014-01-31 DIAGNOSIS — Z992 Dependence on renal dialysis: Secondary | ICD-10-CM

## 2014-01-31 DIAGNOSIS — I10 Essential (primary) hypertension: Secondary | ICD-10-CM

## 2014-01-31 DIAGNOSIS — J42 Unspecified chronic bronchitis: Secondary | ICD-10-CM

## 2014-01-31 NOTE — Progress Notes (Signed)
[place], [place], [place]  [address] Phone: [phone] Fax:  [phone]  Date:  01/31/2014   ID:  Jeremy Mccoy, DOB December 14, 1965, MRN [id number]  PCP:  Leamon Arnt, MD  Cardiologist:  Fransico Him, MD    History of Present Illness: Jeremy Mccoy is a 48 y.o. male with a history of COPD, crack cocaine use, HTN and ESRD on HD who was recently in [facility] with hypertensive emergency and acute respiratory failure secondary to acute pulmonary edema, atrial fibrillation with RVR and volume overload.  He had misesd 3 days of HD and on admission was hyperkalemic with acute pulmonary edema and markedly elevated BP.  He was initially placed on a Nicardipine gtt and IV Hydralazine.  He was emergently dialyzed.  He was given IV Lopressor for control of afib.  He ruled out for MI.  He was eventually changed to amlodipine.  He was discharged home on clonidine, coreg and Losartan.  He now presents back for followup of his afib.  On discharge he was back in NSR.  He was sent home on ASA for CHADS2VASC of 1.  He presents today for followup.  He is doing very well.  He has not missed any HD.  He denies any chest pain, SOB, DOE, LE edema, dizziness, palpitations or syncope.   Wt Readings from Last 3 Encounters:  01/31/14 162 lb 6.4 oz (73.664 kg)  01/29/14 165 lb 11.2 oz (75.161 kg)  01/29/14 165 lb (74.844 kg)     Past Medical History  Diagnosis Date  . COPD (chronic obstructive pulmonary disease)   . Crack cocaine use   . Dental caries   . HTN (hypertension) 06/12/2012  . Hematemesis/vomiting blood   . Shortness of breath     "just when I have too much potassium is too high" (05/15/2013)  . Anemia   . History of blood transfusion 10/2011; 04/2013  . Headache(784.0)     "get it from going to dialysis; when they get real bad I come to hospital" (05/15/2013)  . Arthritis     "qwhere" (05/15/2013)  . End stage renal disease 01/11/2012    Patient presented 11/04/11 to hospital with  CP, wt loss and N/V. Creat was 10, admitted. 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 ANA was + 1:80, +MPO and +pr3 Ab's (ANCA). Received plasmapheresis x 7, IV cytoxan and pred taper. First HD was 11/17/11. 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 Pt d/c'd to do outpt HD and may have gotten 1-2 additional Cytoxan as OP, but this was stopped eventually since renal function didn't recover. Now gets HD TTS schedule at [facility].  L forearm AVF 11/19/11 by Dr. Scot Dock is current access.    Marland Kitchen ESRD (end stage renal disease) on dialysis     TTS: [address] [place] (05/15/2013)  . ESRD (end stage renal disease) on dialysis   . Headache(784.0)   . Depression   . Anxiety     Current Outpatient Prescriptions  Medication Sig Dispense Refill  . acetaminophen (TYLENOL) 500 MG tablet Take 1,000 mg by mouth every 6 (six) hours as needed for mild pain.       Marland Kitchen amLODipine (NORVASC) 10 MG tablet Take 1 tablet (10 mg total) by mouth daily.  30 tablet  1  . aspirin 81 MG chewable tablet Chew 1 tablet (81 mg total)  by mouth daily.  30 tablet  11  . b complex vitamins tablet Take 1 tablet by mouth daily.      . carvedilol (COREG) 25 MG tablet Take 1.5 tablets (37.5 mg total) by mouth 2 (two) times daily with a meal. On Monday-Wednesday-Friday-Sunday (non-HD days); on dialysis days (Tuesday-Thursday-Saturday) take it once a day (evening time only).  90 tablet  2  . cinacalcet (SENSIPAR) 30 MG tablet Take 30 mg by mouth daily.      . cloNIDine (CATAPRES) 0.2 MG tablet Take 1 tablet (0.2 mg total) by mouth 2 (two) times daily.  60 tablet  2  . diphenhydrAMINE (BENADRYL) 25 MG tablet Take 25 mg by mouth every 6 (six) hours as needed for itching.      . famotidine (PEPCID) 20 MG tablet Take 1 tablet (20 mg total) by mouth daily.  30 tablet  1  . guaiFENesin-dextromethorphan (ROBITUSSIN DM)  100-10 MG/5ML syrup Take 5 mLs by mouth every 4 (four) hours as needed for cough.  118 mL  0  . hydrOXYzine (ATARAX/VISTARIL) 25 MG tablet Take 25 mg by mouth 3 (three) times daily. itching      . losartan (COZAAR) 100 MG tablet Take 100 mg by mouth at bedtime.      . multivitamin (RENA-VIT) TABS tablet Take 1 tablet by mouth at bedtime.      . nicotine (NICODERM CQ - DOSED IN MG/24 HOURS) 21 mg/24hr patch Place 1 patch (21 mg total) onto the skin daily.  28 patch  0  . Nutritional Supplements (FEEDING SUPPLEMENT, NEPRO CARB STEADY,) LIQD Take 237 mLs by mouth 3 (three) times daily as needed (Supplement).    0  . pantoprazole (PROTONIX) 40 MG tablet Take 1 tablet (40 mg total) by mouth daily.  30 tablet  1  . sevelamer carbonate (RENVELA) 800 MG tablet Take 2 tablets (1,600 mg total) by mouth 3 (three) times daily with meals.  90 tablet  0  . topiramate (TOPAMAX) 25 MG tablet Take 1 tablet (25 mg total) by mouth at bedtime.  60 tablet  6  . VELPHORO 500 MG chewable tablet Chew 1,000 mg by mouth 3 (three) times daily with meals.        No current facility-administered medications for this visit.    Allergies:   No Known Allergies  Social History:  The patient  reports that he has been smoking Cigars and Cigarettes.  He has a 35 pack-year smoking history. He has never used smokeless tobacco. He reports that he uses illicit drugs (Marijuana and Cocaine). He reports that he does not drink alcohol.   Family History:  The patient's family history includes Heart attack in his father.   ROS:  Please see the history of present illness.      All other systems reviewed and negative.   PHYSICAL EXAM: VS:  BP 140/88  Pulse 84  Ht 6\' 1"  (1.854 m)  Wt 162 lb 6.4 oz (73.664 kg)  BMI 21.43 kg/m2 Well nourished, well developed, in no acute distress HEENT: normal Neck: no JVD Cardiac:  normal S1, S2; RRR; no murmur Lungs:  clear to auscultation bilaterally, no wheezing, rhonchi or rales Abd: soft,  nontender, no hepatomegaly Ext: no edema Skin: warm and dry Neuro:  CNs 2-12 intact, no focal abnormalities noted    ASSESSMENT AND PLAN:  1. HTN - controlled - continue amlodipine/coreg/clonidine/losartan 2.  Chronic diastolic CHF - volume managed with HD - continue BB and ARB 3.  Paroxysmal atrial fibrillation maintaining NSR on Coreg - CHADS2VASC Mccoy 1 so on ASA 4.  ESRD on HD 5.  COPD - followed by PCP  Followup with me in 6 months  Signed, Fransico Him, MD [facility] 01/31/2014 2:44 PM

## 2014-01-31 NOTE — Patient Instructions (Signed)
Your physician recommends that you continue on your current medications as directed. Please refer to the Current Medication list given to you today.  Your physician wants you to follow-up in: 6 months with Dr Turner You will receive a reminder letter in the mail two months in advance. If you don't receive a letter, please call our office to schedule the follow-up appointment.  

## 2014-02-03 ENCOUNTER — Encounter: Payer: Self-pay | Admitting: Cardiovascular Disease

## 2014-02-03 NOTE — Progress Notes (Signed)
Patient ID: WALES LARRISON, male   DOB: 11-May-1966, 48 y.o.   MRN: [id number]      Reason for office visit Followup after hospitalization  Annie Main was just discharged from: Hospital where he was admitted for acute pulmonary edema with respiratory failure in the setting of a hypertensive emergency and one week of noncompliance with hemodialysis for end-stage renal disease. He is essentially anuric and after one week of missing dialysis was severely hypervolemic. He was also a potassium of 7.6 and corresponding ECG changes. He had transient paroxysmal atrial fibrillation with rapid ventricular response that resolved as his fluid overload and metabolic abnormalities corrected.  He has a history of tobacco abuse and long-standing cocaine abuse (including 4 days prior to that admission), although he has now decided to become completely abstinent from cocaine use. He also has a seizure disorder that is now controlled. He goes to dialysis normally on Tuesdays Thursdays and Saturdays. He claims that his noncompliance with hemodialysis was due to the fact that he always develops a severe splitting headaches towards the end of dialysis.   By echocardiography has normal left ventricular systolic function but has moderate LVH and diastolic dysfunction. He has no significant valvular abnormalities. His left atrium was only mildly dilated No Known Allergies  Current Outpatient Prescriptions  Medication Sig Dispense Refill  . acetaminophen (TYLENOL) 500 MG tablet Take 1,000 mg by mouth every 6 (six) hours as needed for mild pain.       Marland Kitchen amLODipine (NORVASC) 10 MG tablet Take 1 tablet (10 mg total) by mouth daily.  30 tablet  1  . aspirin 81 MG chewable tablet Chew 1 tablet (81 mg total) by mouth daily.  30 tablet  11  . b complex vitamins tablet Take 1 tablet by mouth daily.      . cinacalcet (SENSIPAR) 30 MG tablet Take 30 mg by mouth daily.      . cloNIDine (CATAPRES) 0.2 MG tablet Take 1 tablet (0.2 mg  total) by mouth 2 (two) times daily.  60 tablet  2  . diphenhydrAMINE (BENADRYL) 25 MG tablet Take 25 mg by mouth every 6 (six) hours as needed for itching.      . famotidine (PEPCID) 20 MG tablet Take 1 tablet (20 mg total) by mouth daily.  30 tablet  1  . guaiFENesin-dextromethorphan (ROBITUSSIN DM) 100-10 MG/5ML syrup Take 5 mLs by mouth every 4 (four) hours as needed for cough.  118 mL  0  . hydrOXYzine (ATARAX/VISTARIL) 25 MG tablet Take 25 mg by mouth 3 (three) times daily. itching      . losartan (COZAAR) 100 MG tablet Take 100 mg by mouth at bedtime.      . multivitamin (RENA-VIT) TABS tablet Take 1 tablet by mouth at bedtime.      . nicotine (NICODERM CQ - DOSED IN MG/24 HOURS) 21 mg/24hr patch Place 1 patch (21 mg total) onto the skin daily.  28 patch  0  . Nutritional Supplements (FEEDING SUPPLEMENT, NEPRO CARB STEADY,) LIQD Take 237 mLs by mouth 3 (three) times daily as needed (Supplement).    0  . pantoprazole (PROTONIX) 40 MG tablet Take 1 tablet (40 mg total) by mouth daily.  30 tablet  1  . sevelamer carbonate (RENVELA) 800 MG tablet Take 2 tablets (1,600 mg total) by mouth 3 (three) times daily with meals.  90 tablet  0  . topiramate (TOPAMAX) 25 MG tablet Take 1 tablet (25 mg total) by mouth at bedtime.  60 tablet  6  . VELPHORO 500 MG chewable tablet Chew 1,000 mg by mouth 3 (three) times daily with meals.       . carvedilol (COREG) 25 MG tablet Take 1.5 tablets (37.5 mg total) by mouth 2 (two) times daily with a meal. On Monday-Wednesday-Friday-Sunday (non-HD days); on dialysis days (Tuesday-Thursday-Saturday) take it once a day (evening time only).  90 tablet  2   No current facility-administered medications for this visit.    Past Medical History  Diagnosis Date  . COPD (chronic obstructive pulmonary disease)   . Crack cocaine use   . Dental caries   . HTN (hypertension) 06/12/2012  . Hematemesis/vomiting blood   . Shortness of breath     "just when I have too much  potassium is too high" (05/15/2013)  . Anemia   . History of blood transfusion 10/2011; 04/2013  . Headache(784.0)     "get it from going to dialysis; when they get real bad I come to hospital" (05/15/2013)  . Arthritis     "qwhere" (05/15/2013)  . End stage renal disease 01/11/2012    Patient presented 11/04/11 to hospital with CP, wt loss and N/V. Creat was 10, admitted. 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 ANA was + 1:80, +MPO and +pr3 Ab's (ANCA). Received plasmapheresis x 7, IV cytoxan and pred taper. First HD was 11/17/11. 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 Pt d/c'd to do outpt HD and may have gotten 1-2 additional Cytoxan as OP, but this was stopped eventually since renal function didn't recover. Now gets HD TTS schedule at [facility].  L forearm AVF 11/19/11 by Dr. Scot Dock is current access.    Marland Kitchen ESRD (end stage renal disease) on dialysis     TTS: [address] [place] (05/15/2013)  . ESRD (end stage renal disease) on dialysis   . Headache(784.0)   . Depression   . Anxiety     Past Surgical History  Procedure Laterality Date  . Av fistula placement  11/29/2011    Procedure: ARTERIOVENOUS (AV) FISTULA CREATION;  Surgeon: Angelia Mould, MD;  Location: [place];  Service: Vascular;  Laterality: Left;  . Renal biopsy  11/07/2011  . Inguinal hernia repair Bilateral 1967  . Esophagogastroduodenoscopy N/A 05/17/2013    Procedure: ESOPHAGOGASTRODUODENOSCOPY (EGD);  Surgeon: Beryle Beams, MD;  Location: [facility] ENDOSCOPY;  Service: Endoscopy;  Laterality: N/A;    Family History  Problem Relation Age of Onset  . Heart attack Father     History   Social History  . Marital Status: Single    Spouse Name: N/A    Number of Children: 0  . Years of Education: 9 th   Occupational History  . floor insulation     Disabled   Social History Main Topics  .  Smoking status: Current Every Day Smoker -- 1.00 packs/day for 35 years    Types: Cigars, Cigarettes  . Smokeless tobacco: Never Used  . Alcohol Use: No     Comment: 05/15/2013 "aien't drank in ~ 25 yrs"  . Drug Use: Yes    Special: Marijuana, Cocaine     Comment: Precription drugs (e.g. percocet) "I take what I have to to controll my constant pain" (05/15/2013)  . Sexual Activity: No   Other Topics Concern  . Not on file   Social History Narrative   ** Merged History Encounter **  Patient lives at home with his parents and he is disabled.   Education 9th grade education   Right handed   Caffeine one cup daily        Review of systems: He has chronic headaches, he tolerated dialysis better last time. He denies problems with shortness of breath since his hospitalization, except if he truly overexerts himself. He denies chest pain, palpitations, syncope or swelling  The patient specifically denies any chest pain at rest or with exertion, dyspnea at rest, orthopnea, paroxysmal nocturnal dyspnea, syncope, palpitations, focal neurological deficits, intermittent claudication, lower extremity edema, unexplained weight gain, cough, hemoptysis or wheezing.  The patient also denies abdominal pain, nausea, vomiting, dysphagia, diarrhea, constipation, polyuria, polydipsia, dysuria, hematuria, frequency, urgency, abnormal bleeding or bruising, fever, chills, unexpected weight changes, mood swings, change in skin or hair texture, change in voice quality, auditory or visual problems, allergic reactions or rashes, new musculoskeletal complaints other than usual "aches and pains".   PHYSICAL EXAM BP 140/100  Pulse 77  Resp 16  Ht 6' (1.829 m)  Wt 75.161 kg (165 lb 11.2 oz)  BMI 22.47 kg/m2  General: Alert, oriented x3, no distress. He is pale and appears chronically ill Head: no evidence of trauma, PERRL, EOMI, no exophtalmos or lid lag, no myxedema, no xanthelasma; normal ears, nose and  oropharynx Neck: normal jugular venous pulsations and no hepatojugular reflux; brisk carotid pulses without delay and no carotid bruits Chest: clear to auscultation, no signs of consolidation by percussion or palpation, normal fremitus, symmetrical and full respiratory excursions Cardiovascular: normal position and quality of the apical impulse, regular rhythm, normal first and second heart sounds, no pericardial rubs, but he has a loud S4, grade 2-3 early peaking systolic ejection murmur in the aortic focus Abdomen: no tenderness or distention, no masses by palpation, no abnormal pulsatility or arterial bruits, normal bowel sounds, no hepatosplenomegaly Extremities: Large left fistula with excellent thrill and bruit, no clubbing, cyanosis or edema; 2+ radial, ulnar and brachial pulses bilaterally; 2+ right femoral, posterior tibial and dorsalis pedis pulses; 2+ left femoral, posterior tibial and dorsalis pedis pulses; no subclavian or femoral bruits Neurological: grossly nonfocal   EKG: Normal sinus rhythm, left atrial abnormality, LVH by voltage only  Lipid Panel  No results found for this basename: chol, trig, hdl, cholhdl, vldl, ldlcalc    BMET    Component Value Date/Time   NA 139 01/25/2014 0810   K 5.3 01/25/2014 0810   CL 95* 01/25/2014 0810   CO2 21 01/25/2014 0810   GLUCOSE 99 01/25/2014 0810   BUN 83* 01/25/2014 0810   CREATININE 12.12* 01/25/2014 0810   CALCIUM 9.6 01/25/2014 0810   GFRNONAA 4* 01/25/2014 0810   GFRAA 5* 01/25/2014 0810     ASSESSMENT AND PLAN  Blood pressure control remains difficult in Mr. Ker case. He reports compliance with his medications. He has evidence of mild diastolic dysfunction. He is on a maximum dose of losartan and a high dose of carvedilol. He also takes a moderate dose of clonidine and a maximum dose of amlodipine. Diuretics would obviously not be helpful. We'll try to push the dose of carvedilol as much as his heart rate will allow. Another  option is to switch from losartan to a more potent angiotensin receptor blocker  Compliance with dialysis and sodium and fluid restriction is critical.  As long as he does not become acutely ill or have major chemical abnormalities, the relative risk of atrial fibrillation recurrence is low. I  am very reluctant to prescribe anticoagulants in view of his noncompliance with hemodialysis and history of very recent cocaine use. In addition his risk of embolic events should be relatively low, his only known risk factor is hypertension(CHADSVasc 1).  Patient Instructions  Increase Carvedilol 25mg  to 1 1/2 tablets twice a day.  A new Rx has been sent to your pharmacy.  Dr. Sallyanne Kuster recommends that you schedule a follow-up appointment in: 3 months.       Orders Placed This Encounter  Procedures  . EKG 12-Lead   Meds ordered this encounter  Medications  . DISCONTD: carvedilol (COREG) 25 MG tablet    Sig: Take 1.5 tablets (37.5 mg total) by mouth 2 (two) times daily with a meal. On Monday-Wednesday-Friday-Sunday (non-HD days); on dialysis days (Tuesday-Thursday-Saturday) take it once a day (evening time only).    Dispense:  90 tablet    Refill:  2    Kenadee Gates  Sanda Klein, MD, [facility] [phone] office [phone] pager

## 2014-02-11 ENCOUNTER — Other Ambulatory Visit: Payer: Self-pay | Admitting: Neurology

## 2014-02-11 NOTE — Telephone Encounter (Signed)
Last OV Note says: As far as your medications are concerned, I would like to suggest the following:  1)Please stop the Keppra  2)Please start Topamax 25mg  nightly

## 2014-02-13 ENCOUNTER — Emergency Department (HOSPITAL_COMMUNITY)
Admission: EM | Admit: 2014-02-13 | Discharge: 2014-02-13 | Disposition: A | Discharge status: Home / Self Care | Payer: Medicare Other | Attending: Emergency Medicine | Admitting: Emergency Medicine

## 2014-02-13 ENCOUNTER — Encounter (HOSPITAL_COMMUNITY): Payer: Self-pay | Admitting: Emergency Medicine

## 2014-02-13 DIAGNOSIS — Z992 Dependence on renal dialysis: Secondary | ICD-10-CM | POA: Insufficient documentation

## 2014-02-13 DIAGNOSIS — J449 Chronic obstructive pulmonary disease, unspecified: Secondary | ICD-10-CM | POA: Insufficient documentation

## 2014-02-13 DIAGNOSIS — F329 Major depressive disorder, single episode, unspecified: Secondary | ICD-10-CM | POA: Diagnosis not present

## 2014-02-13 DIAGNOSIS — I120 Hypertensive chronic kidney disease with stage 5 chronic kidney disease or end stage renal disease: Secondary | ICD-10-CM | POA: Diagnosis not present

## 2014-02-13 DIAGNOSIS — D649 Anemia, unspecified: Secondary | ICD-10-CM | POA: Insufficient documentation

## 2014-02-13 DIAGNOSIS — Z720 Tobacco use: Secondary | ICD-10-CM | POA: Insufficient documentation

## 2014-02-13 DIAGNOSIS — M1990 Unspecified osteoarthritis, unspecified site: Secondary | ICD-10-CM | POA: Diagnosis not present

## 2014-02-13 DIAGNOSIS — Z79899 Other long term (current) drug therapy: Secondary | ICD-10-CM | POA: Diagnosis not present

## 2014-02-13 DIAGNOSIS — R7989 Other specified abnormal findings of blood chemistry: Secondary | ICD-10-CM | POA: Diagnosis present

## 2014-02-13 DIAGNOSIS — R51 Headache: Secondary | ICD-10-CM | POA: Diagnosis not present

## 2014-02-13 DIAGNOSIS — F419 Anxiety disorder, unspecified: Secondary | ICD-10-CM | POA: Insufficient documentation

## 2014-02-13 DIAGNOSIS — N186 End stage renal disease: Secondary | ICD-10-CM | POA: Insufficient documentation

## 2014-02-13 DIAGNOSIS — Z8719 Personal history of other diseases of the digestive system: Secondary | ICD-10-CM | POA: Diagnosis not present

## 2014-02-13 LAB — CBC WITH DIFFERENTIAL/PLATELET
Basophils Absolute: 0 10*3/uL (ref 0.0–0.1)
Basophils Relative: 1 % (ref 0–1)
EOS ABS: 0.1 10*3/uL (ref 0.0–0.7)
EOS PCT: 1 % (ref 0–5)
HCT: 21.8 % — ABNORMAL LOW (ref 39.0–52.0)
Hemoglobin: 7.3 g/dL — ABNORMAL LOW (ref 13.0–17.0)
Lymphocytes Relative: 19 % (ref 12–46)
Lymphs Abs: 0.8 10*3/uL (ref 0.7–4.0)
MCH: 30.4 pg (ref 26.0–34.0)
MCHC: 33.5 g/dL (ref 30.0–36.0)
MCV: 90.8 fL (ref 78.0–100.0)
Monocytes Absolute: 0.4 10*3/uL (ref 0.1–1.0)
Monocytes Relative: 10 % (ref 3–12)
Neutro Abs: 3 10*3/uL (ref 1.7–7.7)
Neutrophils Relative %: 69 % (ref 43–77)
Platelets: 237 10*3/uL (ref 150–400)
RBC: 2.4 MIL/uL — AB (ref 4.22–5.81)
RDW: 14.6 % (ref 11.5–15.5)
WBC: 4.3 10*3/uL (ref 4.0–10.5)

## 2014-02-13 LAB — BASIC METABOLIC PANEL
ANION GAP: 17 — AB (ref 5–15)
BUN: 31 mg/dL — ABNORMAL HIGH (ref 6–23)
CO2: 29 meq/L (ref 19–32)
CREATININE: 6.73 mg/dL — AB (ref 0.50–1.35)
Calcium: 9.6 mg/dL (ref 8.4–10.5)
Chloride: 95 mEq/L — ABNORMAL LOW (ref 96–112)
GFR calc non Af Amer: 9 mL/min — ABNORMAL LOW (ref >90)
GFR, EST AFRICAN AMERICAN: 10 mL/min — AB (ref >90)
Glucose, Bld: 122 mg/dL — ABNORMAL HIGH (ref 70–99)
Potassium: 3.2 mEq/L — ABNORMAL LOW (ref 3.7–5.3)
Sodium: 141 mEq/L (ref 137–147)

## 2014-02-13 LAB — TYPE AND SCREEN
ABO/RH(D): O POS
Antibody Screen: POSITIVE
DAT, IGG: NEGATIVE

## 2014-02-13 NOTE — ED Notes (Signed)
Pt with left fistula. Thrill and bruit present

## 2014-02-13 NOTE — ED Notes (Signed)
Pt was sent here from dialysis for low HGB at 6.9. Short stay was unable to see him till Monday so he was sent here for blood transfusion. Pt received dialysis today as well. Pt reports some weakness and SOB.

## 2014-02-13 NOTE — Discharge Instructions (Signed)
As discussed, your evaluation today has been largely reassuring.  But, it is important that you monitor your condition carefully, and do not hesitate to return to the ED if you develop new, or concerning changes in your condition.  Otherwise, please follow-up with your physician for appropriate ongoing care.  Anemia, Nonspecific Anemia is a condition in which the concentration of red blood cells or hemoglobin in the blood is below normal. Hemoglobin is a substance in red blood cells that carries oxygen to the tissues of the body. Anemia results in not enough oxygen reaching these tissues.  CAUSES  Common causes of anemia include:   Excessive bleeding. Bleeding may be internal or external. This includes excessive bleeding from periods (in women) or from the intestine.   Poor nutrition.   Chronic kidney, thyroid, and liver disease.  Bone marrow disorders that decrease red blood cell production.  Cancer and treatments for cancer.  HIV, AIDS, and their treatments.  Spleen problems that increase red blood cell destruction.  Blood disorders.  Excess destruction of red blood cells due to infection, medicines, and autoimmune disorders. SIGNS AND SYMPTOMS   Minor weakness.   Dizziness.   Headache.  Palpitations.   Shortness of breath, especially with exercise.   Paleness.  Cold sensitivity.  Indigestion.  Nausea.  Difficulty sleeping.  Difficulty concentrating. Symptoms may occur suddenly or they may develop slowly.  DIAGNOSIS  Additional blood tests are often needed. These help your health care provider determine the best treatment. Your health care provider will check your stool for blood and look for other causes of blood loss.  TREATMENT  Treatment varies depending on the cause of the anemia. Treatment can include:   Supplements of iron, vitamin 123456, or folic acid.   Hormone medicines.   A blood transfusion. This may be needed if blood loss is severe.    Hospitalization. This may be needed if there is significant continual blood loss.   Dietary changes.  Spleen removal. HOME CARE INSTRUCTIONS Keep all follow-up appointments. It often takes many weeks to correct anemia, and having your health care provider check on your condition and your response to treatment is very important. SEEK IMMEDIATE MEDICAL CARE IF:   You develop extreme weakness, shortness of breath, or chest pain.   You become dizzy or have trouble concentrating.  You develop heavy vaginal bleeding.   You develop a rash.   You have bloody or black, tarry stools.   You faint.   You vomit up blood.   You vomit repeatedly.   You have abdominal pain.  You have a fever or persistent symptoms for more than 2-3 days.   You have a fever and your symptoms suddenly get worse.   You are dehydrated.  MAKE SURE YOU:  Understand these instructions.  Will watch your condition.  Will get help right away if you are not doing well or get worse. Document Released: 06/09/2004 Document Revised: 01/02/2013 Document Reviewed: 10/26/2012 [facility] Patient Information 2015 [place], [place]. This information is not intended to replace advice given to you by your health care provider. Make sure you discuss any questions you have with your health care provider.

## 2014-02-13 NOTE — ED Provider Notes (Signed)
CSN: [id number]     Arrival date & time 02/13/14  1239 History   First MD Initiated Contact with Patient 02/13/14 1242     Chief Complaint  Patient presents with  . Abnormal Lab     (Consider location/radiation/quality/duration/timing/severity/associated sxs/prior Treatment) HPI Patient presents with minimal personal complaints, but with concern of his dialysis center about anemia. The patient himself notes that he is fatigued with typical exertion, but has no fatigue at rest, nor any pain, lightheadedness, nausea at rest. He denies changes in his baseline condition. Patient was at dialysis today, found to be anemic, sent here for evaluation. Patient has a known anemia, previously has received blood transfusions.  Past Medical History  Diagnosis Date  . COPD (chronic obstructive pulmonary disease)   . Crack cocaine use   . Dental caries   . HTN (hypertension) 06/12/2012  . Hematemesis/vomiting blood   . Shortness of breath     "just when I have too much potassium is too high" (05/15/2013)  . Anemia   . History of blood transfusion 10/2011; 04/2013  . Headache(784.0)     "get it from going to dialysis; when they get real bad I come to hospital" (05/15/2013)  . Arthritis     "qwhere" (05/15/2013)  . End stage renal disease 01/11/2012    Patient presented 11/04/11 to hospital with CP, wt loss and N/V. Creat was 10, admitted. 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 ANA was + 1:80, +MPO and +pr3 Ab's (ANCA). Received plasmapheresis x 7, IV cytoxan and pred taper. First HD was 11/17/11. 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 Pt d/c'd to do outpt HD and may have gotten 1-2 additional Cytoxan as OP, but this was stopped eventually since renal function didn't recover. Now gets HD TTS schedule at [facility].  L forearm AVF 11/19/11 by Dr. Scot Dock is current access.     Marland Kitchen ESRD (end stage renal disease) on dialysis     TTS: [address] [place] (05/15/2013)  . ESRD (end stage renal disease) on dialysis   . Headache(784.0)   . Depression   . Anxiety    Past Surgical History  Procedure Laterality Date  . Av fistula placement  11/29/2011    Procedure: ARTERIOVENOUS (AV) FISTULA CREATION;  Surgeon: Angelia Mould, MD;  Location: [place];  Service: Vascular;  Laterality: Left;  . Renal biopsy  11/07/2011  . Inguinal hernia repair Bilateral 1967  . Esophagogastroduodenoscopy N/A 05/17/2013    Procedure: ESOPHAGOGASTRODUODENOSCOPY (EGD);  Surgeon: Beryle Beams, MD;  Location: [facility] ENDOSCOPY;  Service: Endoscopy;  Laterality: N/A;   Family History  Problem Relation Age of Onset  . Heart attack Father    History  Substance Use Topics  . Smoking status: Current Every Day Smoker -- 1.00 packs/day for 35 years    Types: Cigars, Cigarettes  . Smokeless tobacco: Never Used  . Alcohol Use: No     Comment: 05/15/2013 "aien't drank in ~ 25 yrs"    Review of Systems  Constitutional:       Per HPI, otherwise negative  HENT:       Per HPI, otherwise negative  Respiratory:       Per HPI, otherwise negative  Cardiovascular:       Per HPI, otherwise negative  Gastrointestinal: Negative for vomiting.  Endocrine:       Negative aside from HPI  Genitourinary:       Neg aside from HPI   Musculoskeletal:       Per HPI, otherwise negative  Skin: Negative.   Neurological: Negative for syncope.      Allergies  Review of patient's allergies indicates no known allergies.  Home Medications   Prior to Admission medications   Medication Sig Start Date End Date Taking? Authorizing Provider  acetaminophen (TYLENOL) 500 MG tablet Take 1,000 mg by mouth every 6 (six) hours as needed for mild pain.     Historical Provider, MD  amLODipine (NORVASC) 10 MG tablet Take 1 tablet (10 mg total) by mouth daily. 01/25/14   Cresenciano Genre, MD  aspirin 81 MG chewable  tablet Chew 1 tablet (81 mg total) by mouth daily. 01/25/14   Cresenciano Genre, MD  b complex vitamins tablet Take 1 tablet by mouth daily.    Historical Provider, MD  carvedilol (COREG) 25 MG tablet Take 1.5 tablets (37.5 mg total) by mouth 2 (two) times daily with a meal. On Monday-Wednesday-Friday-Sunday (non-HD days); on dialysis days (Tuesday-Thursday-Saturday) take it once a day (evening time only). 01/30/14   Mihai Croitoru, MD  cinacalcet (SENSIPAR) 30 MG tablet Take 30 mg by mouth daily.    Historical Provider, MD  cloNIDine (CATAPRES) 0.2 MG tablet Take 1 tablet (0.2 mg total) by mouth 2 (two) times daily. 11/08/13   Reyne Dumas, MD  diphenhydrAMINE (BENADRYL) 25 MG tablet Take 25 mg by mouth every 6 (six) hours as needed for itching.    Historical Provider, MD  famotidine (PEPCID) 20 MG tablet Take 1 tablet (20 mg total) by mouth daily. 01/25/14   Cresenciano Genre, MD  guaiFENesin-dextromethorphan (ROBITUSSIN DM) 100-10 MG/5ML syrup Take 5 mLs by mouth every 4 (four) hours as needed for cough. 11/08/13   Reyne Dumas, MD  hydrOXYzine (ATARAX/VISTARIL) 25 MG tablet Take 25 mg by mouth 3 (three) times daily. itching    Historical Provider, MD  losartan (COZAAR) 100 MG tablet Take 100 mg by mouth at bedtime.    Historical Provider, MD  multivitamin (RENA-VIT) TABS tablet Take 1 tablet by mouth at bedtime. 11/29/11   Myriam Jacobson, PA-C  nicotine (NICODERM CQ - DOSED IN MG/24 HOURS) 21 mg/24hr patch Place 1 patch (21 mg total) onto the skin daily. 11/08/13   Reyne Dumas, MD  Nutritional Supplements (FEEDING SUPPLEMENT, NEPRO CARB STEADY,) LIQD Take 237 mLs by mouth 3 (three) times daily as needed (Supplement). 05/17/13   Maryann Mikhail, DO  pantoprazole (PROTONIX) 40 MG tablet Take 1 tablet (40 mg total) by mouth daily. 01/25/14   Cresenciano Genre, MD  sevelamer carbonate (RENVELA) 800 MG tablet Take 2 tablets (1,600 mg total) by mouth 3 (three) times daily with meals. 01/25/14   Cresenciano Genre, MD   topiramate (TOPAMAX) 25 MG tablet Take 1 tablet (25 mg total) by mouth at bedtime. 01/29/14   Hulen Luster, DO  VELPHORO 500 MG chewable tablet Chew 1,000 mg by mouth 3 (three) times daily with meals.  09/11/13   Historical Provider, MD   BP 125/74  Temp(Src) 98.5 F (36.9 C) (Oral)  Resp 13  SpO2 100% Physical Exam  Nursing note and vitals reviewed. Constitutional: He is oriented to person, place, and time. He appears well-developed. No distress.  HENT:  Head: Normocephalic and atraumatic.  Eyes: Conjunctivae and EOM are normal.  Cardiovascular: Normal rate and regular rhythm.   Pulmonary/Chest: Effort normal. No stridor. No respiratory distress.  Abdominal:  He exhibits no distension.  Musculoskeletal: He exhibits no edema.  Neurological: He is alert and oriented to person, place, and time.  Skin: Skin is warm and dry.  Psychiatric: He has a normal mood and affect.    ED Course  Procedures (including critical care time) Labs Review Labs Reviewed  CBC WITH DIFFERENTIAL - Abnormal; Notable for the following:    RBC 2.40 (*)    Hemoglobin 7.3 (*)    HCT 21.8 (*)    All other components within normal limits  BASIC METABOLIC PANEL - Abnormal; Notable for the following:    Potassium 3.2 (*)    Chloride 95 (*)    Glucose, Bld 122 (*)    BUN 31 (*)    Creatinine, Ser 6.73 (*)    GFR calc non Af Amer 9 (*)    GFR calc Af Amer 10 (*)    Anion gap 17 (*)    All other components within normal limits  TYPE AND SCREEN    I reviewed the patient's chart  2:49 PM On repeat exam the patient is awake and alert, in no distress. He'll follow up with his physicians to discuss additional therapy for his anemia MDM  Patient presents with intermittent, fatigue.  Patient has a history of anemia, likely chronic disease related. Today the patient is awake alert and hemodynamically stable with no active bleeding. Patient's hemoglobin is 7.3.  Patient was discharged to follow up with  his physicians for further evaluation and management.    Carmin Muskrat, MD 02/13/14 1450

## 2014-02-20 ENCOUNTER — Inpatient Hospital Stay (HOSPITAL_COMMUNITY)
Admission: EM | Admit: 2014-02-20 | Discharge: 2014-02-22 | DRG: 811 | Disposition: A | Discharge status: Home / Self Care | Payer: Medicare Other | Attending: Oncology | Admitting: Oncology

## 2014-02-20 ENCOUNTER — Encounter (HOSPITAL_COMMUNITY): Payer: Self-pay | Admitting: Emergency Medicine

## 2014-02-20 ENCOUNTER — Other Ambulatory Visit: Payer: Self-pay

## 2014-02-20 DIAGNOSIS — D62 Acute posthemorrhagic anemia: Secondary | ICD-10-CM | POA: Diagnosis present

## 2014-02-20 DIAGNOSIS — F419 Anxiety disorder, unspecified: Secondary | ICD-10-CM | POA: Diagnosis present

## 2014-02-20 DIAGNOSIS — Z992 Dependence on renal dialysis: Secondary | ICD-10-CM

## 2014-02-20 DIAGNOSIS — F1410 Cocaine abuse, uncomplicated: Secondary | ICD-10-CM | POA: Diagnosis present

## 2014-02-20 DIAGNOSIS — N189 Chronic kidney disease, unspecified: Secondary | ICD-10-CM

## 2014-02-20 DIAGNOSIS — I4891 Unspecified atrial fibrillation: Secondary | ICD-10-CM | POA: Diagnosis present

## 2014-02-20 DIAGNOSIS — G40909 Epilepsy, unspecified, not intractable, without status epilepticus: Secondary | ICD-10-CM | POA: Diagnosis present

## 2014-02-20 DIAGNOSIS — Z7982 Long term (current) use of aspirin: Secondary | ICD-10-CM | POA: Diagnosis not present

## 2014-02-20 DIAGNOSIS — I5032 Chronic diastolic (congestive) heart failure: Secondary | ICD-10-CM | POA: Diagnosis present

## 2014-02-20 DIAGNOSIS — D631 Anemia in chronic kidney disease: Secondary | ICD-10-CM

## 2014-02-20 DIAGNOSIS — F329 Major depressive disorder, single episode, unspecified: Secondary | ICD-10-CM | POA: Diagnosis present

## 2014-02-20 DIAGNOSIS — Z720 Tobacco use: Secondary | ICD-10-CM | POA: Diagnosis present

## 2014-02-20 DIAGNOSIS — Z79899 Other long term (current) drug therapy: Secondary | ICD-10-CM | POA: Diagnosis not present

## 2014-02-20 DIAGNOSIS — J449 Chronic obstructive pulmonary disease, unspecified: Secondary | ICD-10-CM | POA: Diagnosis present

## 2014-02-20 DIAGNOSIS — I120 Hypertensive chronic kidney disease with stage 5 chronic kidney disease or end stage renal disease: Secondary | ICD-10-CM | POA: Diagnosis present

## 2014-02-20 DIAGNOSIS — R5383 Other fatigue: Secondary | ICD-10-CM | POA: Diagnosis present

## 2014-02-20 DIAGNOSIS — N2581 Secondary hyperparathyroidism of renal origin: Secondary | ICD-10-CM | POA: Diagnosis present

## 2014-02-20 DIAGNOSIS — R1310 Dysphagia, unspecified: Secondary | ICD-10-CM | POA: Diagnosis present

## 2014-02-20 DIAGNOSIS — K210 Gastro-esophageal reflux disease with esophagitis: Secondary | ICD-10-CM | POA: Diagnosis present

## 2014-02-20 DIAGNOSIS — R1013 Epigastric pain: Secondary | ICD-10-CM

## 2014-02-20 DIAGNOSIS — D6489 Other specified anemias: Secondary | ICD-10-CM

## 2014-02-20 DIAGNOSIS — I150 Renovascular hypertension: Secondary | ICD-10-CM

## 2014-02-20 DIAGNOSIS — N186 End stage renal disease: Secondary | ICD-10-CM | POA: Diagnosis present

## 2014-02-20 DIAGNOSIS — I10 Essential (primary) hypertension: Secondary | ICD-10-CM | POA: Diagnosis present

## 2014-02-20 DIAGNOSIS — I480 Paroxysmal atrial fibrillation: Secondary | ICD-10-CM | POA: Diagnosis present

## 2014-02-20 DIAGNOSIS — D649 Anemia, unspecified: Secondary | ICD-10-CM | POA: Diagnosis present

## 2014-02-20 LAB — COMPREHENSIVE METABOLIC PANEL
ALBUMIN: 3.5 g/dL (ref 3.5–5.2)
ALT: 64 U/L — ABNORMAL HIGH (ref 0–53)
ANION GAP: 17 — AB (ref 5–15)
AST: 33 U/L (ref 0–37)
Alkaline Phosphatase: 203 U/L — ABNORMAL HIGH (ref 39–117)
BUN: 36 mg/dL — AB (ref 6–23)
CALCIUM: 9.5 mg/dL (ref 8.4–10.5)
CO2: 29 mEq/L (ref 19–32)
Chloride: 93 mEq/L — ABNORMAL LOW (ref 96–112)
Creatinine, Ser: 7.25 mg/dL — ABNORMAL HIGH (ref 0.50–1.35)
GFR calc Af Amer: 9 mL/min — ABNORMAL LOW (ref >90)
GFR calc non Af Amer: 8 mL/min — ABNORMAL LOW (ref >90)
Glucose, Bld: 136 mg/dL — ABNORMAL HIGH (ref 70–99)
Potassium: 3.1 mEq/L — ABNORMAL LOW (ref 3.7–5.3)
Sodium: 139 mEq/L (ref 137–147)
TOTAL PROTEIN: 7.8 g/dL (ref 6.0–8.3)
Total Bilirubin: 0.2 mg/dL — ABNORMAL LOW (ref 0.3–1.2)

## 2014-02-20 LAB — RETICULOCYTES
RBC.: 1.97 MIL/uL — ABNORMAL LOW (ref 4.22–5.81)
Retic Count, Absolute: 59.1 10*3/uL (ref 19.0–186.0)
Retic Ct Pct: 3 % (ref 0.4–3.1)

## 2014-02-20 LAB — FERRITIN: Ferritin: 1290 ng/mL — ABNORMAL HIGH (ref 22–322)

## 2014-02-20 LAB — LACTATE DEHYDROGENASE: LDH: 178 U/L (ref 94–250)

## 2014-02-20 LAB — HAPTOGLOBIN: Haptoglobin: 246 mg/dL — ABNORMAL HIGH (ref 45–215)

## 2014-02-20 LAB — I-STAT TROPONIN, ED: TROPONIN I, POC: 0.01 ng/mL (ref 0.00–0.08)

## 2014-02-20 LAB — LIPASE, BLOOD: LIPASE: 17 U/L (ref 11–59)

## 2014-02-20 LAB — IRON AND TIBC
IRON: 96 ug/dL (ref 42–135)
Saturation Ratios: 43 % (ref 20–55)
TIBC: 223 ug/dL (ref 215–435)
UIBC: 127 ug/dL (ref 125–400)

## 2014-02-20 LAB — CBC WITH DIFFERENTIAL/PLATELET
BASOS PCT: 0 % (ref 0–1)
Basophils Absolute: 0 10*3/uL (ref 0.0–0.1)
EOS ABS: 0.1 10*3/uL (ref 0.0–0.7)
EOS PCT: 1 % (ref 0–5)
HCT: 18.2 % — ABNORMAL LOW (ref 39.0–52.0)
Hemoglobin: 6 g/dL — CL (ref 13.0–17.0)
LYMPHS ABS: 1.1 10*3/uL (ref 0.7–4.0)
Lymphocytes Relative: 17 % (ref 12–46)
MCH: 30.8 pg (ref 26.0–34.0)
MCHC: 33 g/dL (ref 30.0–36.0)
MCV: 93.3 fL (ref 78.0–100.0)
Monocytes Absolute: 0.3 10*3/uL (ref 0.1–1.0)
Monocytes Relative: 5 % (ref 3–12)
Neutro Abs: 4.8 10*3/uL (ref 1.7–7.7)
Neutrophils Relative %: 76 % (ref 43–77)
PLATELETS: 260 10*3/uL (ref 150–400)
RBC: 1.95 MIL/uL — AB (ref 4.22–5.81)
RDW: 16.1 % — ABNORMAL HIGH (ref 11.5–15.5)
WBC: 6.3 10*3/uL (ref 4.0–10.5)

## 2014-02-20 LAB — PREPARE RBC (CROSSMATCH)

## 2014-02-20 LAB — PROTIME-INR
INR: 1.29 (ref 0.00–1.49)
PROTHROMBIN TIME: 16.1 s — AB (ref 11.6–15.2)

## 2014-02-20 LAB — VITAMIN B12: Vitamin B-12: 786 pg/mL (ref 211–911)

## 2014-02-20 LAB — FOLATE: Folate: >20 ng/mL

## 2014-02-20 LAB — POC OCCULT BLOOD, ED: Fecal Occult Bld: NEGATIVE

## 2014-02-20 MED ORDER — GI COCKTAIL ~~LOC~~
30.0000 mL | Freq: Once | ORAL | Status: DC
  Filled 2014-02-20: qty 30

## 2014-02-20 MED ORDER — RENA-VITE PO TABS
1.0000 | ORAL_TABLET | Freq: Every day | ORAL | Status: DC
  Administered 2014-02-20 – 2014-02-21 (×2): 1 via ORAL
  Filled 2014-02-20 (×3): qty 1

## 2014-02-20 MED ORDER — CARVEDILOL 25 MG PO TABS
37.5000 mg | ORAL_TABLET | ORAL | Status: DC
  Administered 2014-02-21 (×2): 37.5 mg via ORAL
  Filled 2014-02-20 (×2): qty 1

## 2014-02-20 MED ORDER — PANTOPRAZOLE SODIUM 40 MG PO TBEC
40.0000 mg | DELAYED_RELEASE_TABLET | Freq: Every day | ORAL | Status: DC
  Administered 2014-02-21 – 2014-02-22 (×2): 40 mg via ORAL
  Filled 2014-02-20: qty 1

## 2014-02-20 MED ORDER — ASPIRIN 81 MG PO CHEW
81.0000 mg | CHEWABLE_TABLET | Freq: Every day | ORAL | Status: DC
Start: 2014-02-21 — End: 2014-02-22
  Administered 2014-02-21 – 2014-02-22 (×2): 81 mg via ORAL
  Filled 2014-02-20: qty 1

## 2014-02-20 MED ORDER — CINACALCET HCL 30 MG PO TABS
30.0000 mg | ORAL_TABLET | Freq: Every day | ORAL | Status: DC
Start: 2014-02-20 — End: 2014-02-22
  Administered 2014-02-21: 30 mg via ORAL
  Filled 2014-02-20 (×3): qty 1

## 2014-02-20 MED ORDER — CARVEDILOL 25 MG PO TABS: 37.5000 mg | ORAL_TABLET | Freq: Two times a day (BID) | ORAL | Status: DC

## 2014-02-20 MED ORDER — NEPRO/CARBSTEADY PO LIQD: 237.0000 mL | Freq: Three times a day (TID) | ORAL | Status: DC | PRN

## 2014-02-20 MED ORDER — SODIUM CHLORIDE 0.9 % IV SOLN: 250.0000 mL | INTRAVENOUS | Status: DC | PRN

## 2014-02-20 MED ORDER — SODIUM CHLORIDE 0.9 % IV SOLN
Freq: Once | INTRAVENOUS | Status: AC
  Administered 2014-02-20: 21:00:00 via INTRAVENOUS

## 2014-02-20 MED ORDER — TOPIRAMATE 25 MG PO TABS
25.0000 mg | ORAL_TABLET | Freq: Every day | ORAL | Status: DC
  Administered 2014-02-20 – 2014-02-21 (×2): 25 mg via ORAL
  Filled 2014-02-20 (×3): qty 1

## 2014-02-20 MED ORDER — SODIUM CHLORIDE 0.9 % IJ SOLN
3.0000 mL | Freq: Two times a day (BID) | INTRAMUSCULAR | Status: DC
  Administered 2014-02-20 – 2014-02-21 (×3): 3 mL via INTRAVENOUS

## 2014-02-20 MED ORDER — CARVEDILOL 25 MG PO TABS
37.5000 mg | ORAL_TABLET | Freq: Every day | ORAL | Status: DC
  Administered 2014-02-21: 37.5 mg via ORAL
  Filled 2014-02-20 (×3): qty 1

## 2014-02-20 MED ORDER — SEVELAMER CARBONATE 800 MG PO TABS
1600.0000 mg | ORAL_TABLET | Freq: Three times a day (TID) | ORAL | Status: DC
  Administered 2014-02-21 – 2014-02-22 (×3): 1600 mg via ORAL
  Filled 2014-02-20 (×8): qty 2

## 2014-02-20 MED ORDER — ACETAMINOPHEN 500 MG PO TABS
1000.0000 mg | ORAL_TABLET | Freq: Four times a day (QID) | ORAL | Status: DC | PRN
  Administered 2014-02-20: 1000 mg via ORAL
  Filled 2014-02-20: qty 2

## 2014-02-20 MED ORDER — POTASSIUM CHLORIDE CRYS ER 20 MEQ PO TBCR
40.0000 meq | EXTENDED_RELEASE_TABLET | Freq: Once | ORAL | Status: AC
  Administered 2014-02-20: 40 meq via ORAL
  Filled 2014-02-20: qty 2

## 2014-02-20 MED ORDER — CLONIDINE HCL 0.2 MG PO TABS
0.2000 mg | ORAL_TABLET | Freq: Two times a day (BID) | ORAL | Status: DC
  Administered 2014-02-20 – 2014-02-22 (×4): 0.2 mg via ORAL
  Filled 2014-02-20 (×4): qty 1
  Filled 2014-02-20: qty 2
  Filled 2014-02-20: qty 1

## 2014-02-20 MED ORDER — SODIUM CHLORIDE 0.9 % IJ SOLN: 3.0000 mL | INTRAMUSCULAR | Status: DC | PRN

## 2014-02-20 NOTE — H&P (Signed)
Date: 02/20/2014               Patient Name:  Jeremy Mccoy MRN: [id number]  DOB: 1965/11/02 Age / Sex: 48 y.o., male   PCP: Leamon Arnt, MD         Medical Service: Internal Medicine Teaching Service         Attending Physician: Dr. Annia Belt, MD    First Contact: Dr. Marvel Plan Pager: [phone]  Second Contact: Dr. Heber Ute Pager: [phone]       After Hours (After 5p/  First Contact Pager: [phone]  weekends / holidays): Second Contact Pager: [phone]   Chief Complaint: weakness, fatigue  History of Present Illness: Mr. Gotte is a 48 yo male with PMHx of ESRD on dialysis on TTS, COPD, cocaine abuse, HTN, anxiety/depression, anemia of chronic disease, and paroxysmal atrial fibrillation who presents to the ED with increasing fatigue and shortness of breath after receiving dialysis today. Patient states he has had increasing fatigue and weakness over the past couple of weeks. He states now he feels almost too weak and tired to walk across the room. He has a long history of anemia in the past and was even evaluated a couple weeks ago for anemia, but was not given any blood since his hemoglobin was 7.3. In the ED, patient's hemoglobin was 6.0. FOBT negative.   Patient also complains of epigastric tenderness radiating to the back that has been going on for 3 days. Pain is constant, achey, feels better with eating. Patient has felt bloated, nausea, but no vomiting, associated with gas, fullness, reflux. Patient cannot sleep because of the pain and discomfort. Patient used to drink alcohol, but quit 23 years ago. He only drinks a few drinks every few months. He denies overuse of NSAIDs. He has never had any abdominal surgeries nor has he ever had pancreatitis. Patient had an EGD in January of 2015 which showed grade A esophagitis, but no PUD. Patient has been on Famotidine 20 mg daily.  Meds:  Prescriptions prior to admission  Medication Sig Dispense Refill  . acetaminophen  (TYLENOL) 500 MG tablet Take 1,000 mg by mouth every 6 (six) hours as needed for mild pain.       Marland Kitchen amLODipine (NORVASC) 10 MG tablet Take 1 tablet (10 mg total) by mouth daily.  30 tablet  1  . aspirin 81 MG chewable tablet Chew 1 tablet (81 mg total) by mouth daily.  30 tablet  11  . carvedilol (COREG) 25 MG tablet Take 1.5 tablets (37.5 mg total) by mouth 2 (two) times daily with a meal. On Monday-Wednesday-Friday-Sunday (non-HD days); on dialysis days (Tuesday-Thursday-Saturday) take it once a day (evening time only).  90 tablet  2  . cinacalcet (SENSIPAR) 30 MG tablet Take 30 mg by mouth daily.      . cloNIDine (CATAPRES) 0.2 MG tablet Take 1 tablet (0.2 mg total) by mouth 2 (two) times daily.  60 tablet  2  . diphenhydrAMINE (BENADRYL) 25 MG tablet Take 25 mg by mouth every 6 (six) hours as needed for itching.      . famotidine (PEPCID) 20 MG tablet Take 1 tablet (20 mg total) by mouth daily.  30 tablet  1  . losartan (COZAAR) 100 MG tablet Take 100 mg by mouth at bedtime.      . multivitamin (RENA-VIT) TABS tablet Take 1 tablet by mouth at bedtime.      . Nutritional Supplements (FEEDING SUPPLEMENT, NEPRO CARB STEADY,) LIQD  Take 237 mLs by mouth 3 (three) times daily as needed (Supplement).    0  . pantoprazole (PROTONIX) 40 MG tablet Take 1 tablet (40 mg total) by mouth daily.  30 tablet  1  . sevelamer carbonate (RENVELA) 800 MG tablet Take 2 tablets (1,600 mg total) by mouth 3 (three) times daily with meals.  90 tablet  0  . topiramate (TOPAMAX) 25 MG tablet Take 1 tablet (25 mg total) by mouth at bedtime.  60 tablet  6    Current Facility-Administered Medications  Medication Dose Route Frequency Provider Last Rate Last Dose  . 0.9 %  sodium chloride infusion  250 mL Intravenous PRN Blain Pais, MD      . 0.9 %  sodium chloride infusion   Intravenous Once Ovid Curd R. Alvino Chapel, MD      . acetaminophen (TYLENOL) tablet 1,000 mg  1,000 mg Oral Q6H PRN Blain Pais, MD      .  Derrill Memo ON 02/21/2014] aspirin chewable tablet 81 mg  81 mg Oral Daily Blain Pais, MD      . carvedilol (COREG) tablet 37.5 mg  37.5 mg Oral Q supper Annia Belt, MD      . Derrill Memo ON 02/21/2014] carvedilol (COREG) tablet 37.5 mg  37.5 mg Oral Once per day on Sun Mon Wed Fri Annia Belt, MD      . cinacalcet [facility]) tablet 30 mg  30 mg Oral Q supper Blain Pais, MD      . cloNIDine (CATAPRES) tablet 0.2 mg  0.2 mg Oral BID Blain Pais, MD      . feeding supplement (NEPRO CARB STEADY) liquid 237 mL  237 mL Oral TID PRN Blain Pais, MD      . gi cocktail (Maalox,Lidocaine,Donnatal)  30 mL Oral Once Osa Craver, MD      . multivitamin (RENA-VIT) tablet 1 tablet  1 tablet Oral QHS Blain Pais, MD      . Derrill Memo ON 02/21/2014] pantoprazole (PROTONIX) EC tablet 40 mg  40 mg Oral Daily Blain Pais, MD      . sevelamer carbonate (RENVELA) tablet 1,600 mg  1,600 mg Oral TID WC Blain Pais, MD      . sodium chloride 0.9 % injection 3 mL  3 mL Intravenous Q12H Blain Pais, MD      . sodium chloride 0.9 % injection 3 mL  3 mL Intravenous PRN Blain Pais, MD      . topiramate (TOPAMAX) tablet 25 mg  25 mg Oral QHS Blain Pais, MD        Allergies: Allergies as of 02/20/2014  . (No Known Allergies)   Past Medical History  Diagnosis Date  . COPD (chronic obstructive pulmonary disease)   . Crack cocaine use   . Dental caries   . HTN (hypertension) 06/12/2012  . Hematemesis/vomiting blood   . Shortness of breath     "just when I have too much potassium is too high" (05/15/2013)  . Anemia   . History of blood transfusion 10/2011; 04/2013  . Headache(784.0)     "get it from going to dialysis; when they get real bad I come to hospital" (05/15/2013)  . Arthritis     "qwhere" (05/15/2013)  . End stage renal disease 01/11/2012    Patient presented 11/04/11 to hospital with CP, wt loss and N/V. Creat was 10,  admitted. 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 ANA  was + 1:80, +MPO and +pr3 Ab's (ANCA). Received plasmapheresis x 7, IV cytoxan and pred taper. First HD was 11/17/11. 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 Pt d/c'd to do outpt HD and may have gotten 1-2 additional Cytoxan as OP, but this was stopped eventually since renal function didn't recover. Now gets HD TTS schedule at [facility].  L forearm AVF 11/19/11 by Dr. Scot Dock is current access.    Marland Kitchen ESRD (end stage renal disease) on dialysis     TTS: [address] [place] (05/15/2013)  . ESRD (end stage renal disease) on dialysis   . Headache(784.0)   . Depression   . Anxiety    Past Surgical History  Procedure Laterality Date  . Av fistula placement  11/29/2011    Procedure: ARTERIOVENOUS (AV) FISTULA CREATION;  Surgeon: Angelia Mould, MD;  Location: [place];  Service: Vascular;  Laterality: Left;  . Renal biopsy  11/07/2011  . Inguinal hernia repair Bilateral 1967  . Esophagogastroduodenoscopy N/A 05/17/2013    Procedure: ESOPHAGOGASTRODUODENOSCOPY (EGD);  Surgeon: Beryle Beams, MD;  Location: [facility] ENDOSCOPY;  Service: Endoscopy;  Laterality: N/A;   Family History  Problem Relation Age of Onset  . Heart attack Father    History   Social History  . Marital Status: Single    Spouse Name: N/A    Number of Children: 0  . Years of Education: 9 th   Occupational History  . floor insulation     Disabled   Social History Main Topics  . Smoking status: Current Every Day Smoker -- 1.00 packs/day for 35 years    Types: Cigars, Cigarettes  . Smokeless tobacco: Never Used  . Alcohol Use: No     Comment: 05/15/2013 "aien't drank in ~ 25 yrs"  . Drug Use: Yes    Special: Marijuana, Cocaine     Comment: Precription drugs (e.g. percocet) "I take what I have to to controll my constant pain" (05/15/2013)  .  Sexual Activity: No   Other Topics Concern  . Not on file   Social History Narrative   ** Merged History Encounter **   Patient lives at home with his parents and he is disabled.   Education 9th grade education   Right handed   Caffeine one cup daily        Review of Systems: General: Admits to fatigue, decreased appetite. Denies fever, chills. Respiratory: Denies SOB, cough, DOE, chest tightness, and wheezing.   Cardiovascular: Denies chest pain and palpitations.  Gastrointestinal: Admits to nausea, epigastric pain radiating to the back, abdominal fullness, gas, bloating, and constipation. Denies vomiting, diarrhea, blood in stool. Genitourinary: Denies dysuria, urgency, frequency, hematuria, suprapubic pain and flank pain. Endocrine: Denies hot or cold intolerance, polyuria, and polydipsia. Musculoskeletal: Denies myalgias, back pain, joint swelling, arthralgias and gait problem.  Neurological: Admits to dizziness, headaches, weakness, lightheadedness. Denies numbness, seizures, and syncope. Psychiatric/Behavioral: Denies mood changes, confusion, nervousness, sleep disturbance and agitation.  Physical Exam: Filed Vitals:   02/20/14 1430 02/20/14 1432 02/20/14 1500 02/20/14 1533  BP: 107/70  100/64 109/74  Pulse: 100  98 104  Temp:    98.8 F (37.1 C)  TempSrc:  Oral  Oral  Resp: 17  17 16   Height:    6' (1.829 m)  Weight:    72.1 kg (158 lb 15.2 oz)  SpO2: 99%  98% 99%   General:  Vital signs reviewed.  Patient is well-developed and well-nourished, in no acute distress and cooperative with exam.  Cardiovascular: RRR, S1 normal, S2 normal, no murmurs, gallops, or rubs. Pulmonary/Chest: Clear to auscultation bilaterally, no wheezes, rales, or rhonchi. Abdominal: Tight with involuntary guarding, mildly tender, non-distended, BS +, no masses, organomegaly.  Musculoskeletal: No joint deformities, erythema, or stiffness, ROM full and nontender. Extremities: No lower extremity  edema bilaterally,  pulses symmetric and intact bilaterally. No cyanosis or clubbing. Neurological: Strength is normal and symmetric bilaterally, 5/5. Skin: Pallor. No rashes or erythema. Psychiatric: Normal mood and affect. speech and behavior is normal. Cognition and memory are normal.   Lab results: Basic Metabolic Panel:  Recent Labs  02/20/14 1305  NA 139  K 3.1*  CL 93*  CO2 29  GLUCOSE 136*  BUN 36*  CREATININE 7.25*  CALCIUM 9.5   Liver Function Tests:  Recent Labs  02/20/14 1305  AST 33  ALT 64*  ALKPHOS 203*  BILITOT 0.2*  PROT 7.8  ALBUMIN 3.5    Recent Labs  02/20/14 1305  LIPASE 17   CBC:  Recent Labs  02/20/14 1305  WBC 6.3  NEUTROABS 4.8  HGB 6.0*  HCT 18.2*  MCV 93.3  PLT 260   Anemia Panel:  Recent Labs  02/20/14 1441  RETICCTPCT 3.0   Coagulation:  Recent Labs  02/20/14 1441  LABPROT 16.1*  INR 1.29   Urine Drug Screen: Drugs of Abuse     Component Value Date/Time   LABOPIA NEGATIVE 11/05/2011 1441   COCAINSCRNUR POSITIVE* 11/05/2011 1441   LABBENZ NEGATIVE 11/05/2011 1441   AMPHETMU NEGATIVE 11/05/2011 1441    Other results: EKG: sinus tachycardia.  Assessment & Plan by Problem: Principal Problem:   Symptomatic anemia Active Problems:   Tobacco abuse   HTN (hypertension)   Seizure disorder   ESRD on hemodialysis   PAF (paroxysmal atrial fibrillation)   Chronic diastolic congestive heart failure   COPD (chronic obstructive pulmonary disease)  Symptomatic Anemia: Hemoglobin 6.0 on admission. Patient states he has had increasing fatigue and weakness of the past couple of weeks. FOBT negative. Patient denies dark, tarry stools. Patient denies hematemesis, hematochezia, melena. Patient has long history of anemia. Currently, his anemia is normocytic at 93.3, HCT 18.2 , MCH 30.8, MCHC 33.0, RDW 16.1. Retic count 16.1 in June 2013. Iron/Anemia panel as of 2013 showed iron 52, TIBC 218, Saturation ratio 24, ferritin 286,  folate 5.9. Vitamin B12 level 701 in March 2015. Patient's hemoglobin has been under 9 since June 2015. Baseline hemoglobin 9 since 2013. Patient receives hemodialysis every TTS and is on Sensipar, Renavit, and Renvela. We will need to clarify that patient is on Epogen or Aranesp as well. Anemia is likely secondary to anemia of chronic disease. We doubt blood loss anemia since patient is chronically low, FOBT negative and no obvious signs of blood loss. We will continue to work this up. -Tranfuse 2 units -CBC/BMET tomorrow am -Reticulocytes tomorrow morning  Epigastric Pain: Patient describes constant, achey epigastric pain radiating to the back worsening over the past 3 days. Associated with decreased appetite, nausea, reflux, gas, bloating, constipation. Pain is 5/10 and better with food. Patient has a history of esophagitis diagnosed on EGD in January 2015. He is on famotidine 20 mg daily. Pain and symptoms could be secondary to esophagitis versus PUD, although no PUD in January and patient denies alcohol use, NSAID overuse, but does have history of cocaine abuse. Pain could also be secondary to  pancreatitis, but does not sound severe in nature and patient does not abuse alcohol. We will check a lipase level to rule out, lipase level 17. Doubt cholecystitis, pain does not occur after a meal and is primarily located in the epigastric region. We will try to symptomatically manage the patient and see if the pain improves with protonix and a GI cocktail. If pain continues or worsen, we will consider abdominal ultrasound, or CT abdomen/pelvis. We will also consider inpatient versus outpatient GI referral depending on progression. -Protonix 40 mg daily -GI cocktail once -BMET/CBC tomorrow morning  ESRD on Dialysis TTS: Patient received dialysis today. No indication for emergent hemodialysis. Nephrology has been consulted who will see the patient. Scheduled for dialysis on Saturday. -Consult to  Nephrology -Renal Diet -Sensipar -Renvela -Topamax for dialysis induced headaches  HTN: Patient is currently low normotensive. 100s/70s. We will monitor and continue his current home medications. -Carvedilol 37.5 mg BID, except QD on TTS -Clonidine 0.2 mg BID  PAF: Patient is currently in sinus tachycardia, HR 104.  -ASA 81 mg daily -Carvedilol 37.5 mg BID, except QD on TTS  Chronic Diastolic CHF: Patient is not currently in an exacerbation -Daily Weights -Carvedilol 37.5 mg BID, except QD on TTS -ASA 81 mg daily  DVT/PE ppx: SCDs  Dispo: Disposition is deferred at this time, awaiting improvement of current medical problems. Anticipated discharge in approximately 1-2 day(s).   The patient does have a current PCP Leamon Arnt, MD) and does not need an [facility] hospital follow-up appointment after discharge.  The patient does not have transportation limitations that hinder transportation to clinic appointments.  Signed: Osa Craver, DO PGY-1 Internal Medicine Resident Pager # [phone] 02/20/2014 5:40 PM

## 2014-02-20 NOTE — ED Notes (Signed)
Attempted report 

## 2014-02-20 NOTE — ED Notes (Addendum)
Pt presents to ED via EMS with SOB and epigastric pain, onset x3 days. Pt states has been SOB when walking. Per EMS, BP-93/60, pulse-109.

## 2014-02-20 NOTE — ED Provider Notes (Signed)
CSN: [id number]     Arrival date & time 02/20/14  1230 History   First MD Initiated Contact with Patient 02/20/14 1242     Chief Complaint  Patient presents with  . Shortness of Breath  . Abdominal Pain     (Consider location/radiation/quality/duration/timing/severity/associated sxs/prior Treatment) Patient is a 48 y.o. male presenting with shortness of breath and abdominal pain. The history is provided by the patient.  Shortness of Breath Severity:  Moderate Associated symptoms: abdominal pain   Associated symptoms: no chest pain, no headaches, no rash and no vomiting   Abdominal Pain Associated symptoms: fatigue and shortness of breath   Associated symptoms: no chest pain, no diarrhea, no nausea and no vomiting    patient presents with fatigue and shortness of breath. States it is worse with exertion. Is unable to do his normal activities. He has a history of anemia which appears to be related to his chronic disease. He is required transfusions in the past. He has had hemoglobins that were 7 or 8 in the ER last week and a dialysis. He also has upper abdominal pain. He is oriented has an ulcer. He states his guaiac test was negative last time he was here, which was a week ago. He denies black stool. The pain his abdomen as dull and constant. It is in his upper abdomen. He was hypotensive with pressures in the 90s for EMS today. He has had dialysis today  Past Medical History  Diagnosis Date  . COPD (chronic obstructive pulmonary disease)   . Crack cocaine use   . Dental caries   . HTN (hypertension) 06/12/2012  . Hematemesis/vomiting blood   . Shortness of breath     "just when I have too much potassium is too high" (05/15/2013)  . Anemia   . History of blood transfusion 10/2011; 04/2013  . Headache(784.0)     "get it from going to dialysis; when they get real bad I come to hospital" (05/15/2013)  . Arthritis     "qwhere" (05/15/2013)  . End stage renal disease 01/11/2012   Patient presented 11/04/11 to hospital with CP, wt loss and N/V. Creat was 10, admitted. 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 ANA was + 1:80, +MPO and +pr3 Ab's (ANCA). Received plasmapheresis x 7, IV cytoxan and pred taper. First HD was 11/17/11. 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 Pt d/c'd to do outpt HD and may have gotten 1-2 additional Cytoxan as OP, but this was stopped eventually since renal function didn't recover. Now gets HD TTS schedule at [facility].  L forearm AVF 11/19/11 by Dr. Scot Dock is current access.    Marland Kitchen ESRD (end stage renal disease) on dialysis     TTS: [address] [place] (05/15/2013)  . ESRD (end stage renal disease) on dialysis   . Headache(784.0)   . Depression   . Anxiety    Past Surgical History  Procedure Laterality Date  . Av fistula placement  11/29/2011    Procedure: ARTERIOVENOUS (AV) FISTULA CREATION;  Surgeon: Angelia Mould, MD;  Location: [place];  Service: Vascular;  Laterality: Left;  . Renal biopsy  11/07/2011  . Inguinal hernia repair Bilateral 1967  . Esophagogastroduodenoscopy N/A 05/17/2013    Procedure: ESOPHAGOGASTRODUODENOSCOPY (EGD);  Surgeon: Beryle Beams, MD;  Location[facility] ENDOSCOPY;  Service: Endoscopy;  Laterality: N/A;   Family History  Problem Relation  Age of Onset  . Heart attack Father    History  Substance Use Topics  . Smoking status: Current Every Day Smoker -- 1.00 packs/day for 35 years    Types: Cigars, Cigarettes  . Smokeless tobacco: Never Used  . Alcohol Use: No     Comment: 05/15/2013 "aien't drank in ~ 25 yrs"    Review of Systems  Constitutional: Positive for fatigue. Negative for activity change and appetite change.  Eyes: Negative for pain.  Respiratory: Positive for shortness of breath. Negative for chest tightness.   Cardiovascular: Negative for chest pain and leg swelling.   Gastrointestinal: Positive for abdominal pain. Negative for nausea, vomiting, diarrhea and blood in stool.  Genitourinary: Negative for flank pain.  Musculoskeletal: Negative for back pain and neck stiffness.  Skin: Positive for pallor. Negative for rash.  Neurological: Negative for weakness, numbness and headaches.  Psychiatric/Behavioral: Negative for behavioral problems.      Allergies  Review of patient's allergies indicates no known allergies.  Home Medications   Prior to Admission medications   Medication Sig Start Date End Date Taking? Authorizing Provider  acetaminophen (TYLENOL) 500 MG tablet Take 1,000 mg by mouth every 6 (six) hours as needed for mild pain.    Yes Historical Provider, MD  amLODipine (NORVASC) 10 MG tablet Take 1 tablet (10 mg total) by mouth daily. 01/25/14  Yes Cresenciano Genre, MD  aspirin 81 MG chewable tablet Chew 1 tablet (81 mg total) by mouth daily. 01/25/14  Yes Cresenciano Genre, MD  carvedilol (COREG) 25 MG tablet Take 1.5 tablets (37.5 mg total) by mouth 2 (two) times daily with a meal. On Monday-Wednesday-Friday-Sunday (non-HD days); on dialysis days (Tuesday-Thursday-Saturday) take it once a day (evening time only). 01/30/14  Yes Mihai Croitoru, MD  cinacalcet (SENSIPAR) 30 MG tablet Take 30 mg by mouth daily.   Yes Historical Provider, MD  cloNIDine (CATAPRES) 0.2 MG tablet Take 1 tablet (0.2 mg total) by mouth 2 (two) times daily. 11/08/13  Yes Reyne Dumas, MD  diphenhydrAMINE (BENADRYL) 25 MG tablet Take 25 mg by mouth every 6 (six) hours as needed for itching.   Yes Historical Provider, MD  famotidine (PEPCID) 20 MG tablet Take 1 tablet (20 mg total) by mouth daily. 01/25/14  Yes Cresenciano Genre, MD  losartan (COZAAR) 100 MG tablet Take 100 mg by mouth at bedtime.   Yes Historical Provider, MD  multivitamin (RENA-VIT) TABS tablet Take 1 tablet by mouth at bedtime. 11/29/11  Yes Myriam Jacobson, PA-C  Nutritional Supplements (FEEDING SUPPLEMENT, NEPRO  CARB STEADY,) LIQD Take 237 mLs by mouth 3 (three) times daily as needed (Supplement). 05/17/13  Yes Maryann Mikhail, DO  pantoprazole (PROTONIX) 40 MG tablet Take 1 tablet (40 mg total) by mouth daily. 01/25/14  Yes Cresenciano Genre, MD  sevelamer carbonate (RENVELA) 800 MG tablet Take 2 tablets (1,600 mg total) by mouth 3 (three) times daily with meals. 01/25/14  Yes Cresenciano Genre, MD  topiramate (TOPAMAX) 25 MG tablet Take 1 tablet (25 mg total) by mouth at bedtime. 01/29/14  Yes Hulen Luster, DO   BP 116/75  Pulse 97  Temp(Src) 98.3 F (36.8 C) (Oral)  Resp 17  SpO2 99% Physical Exam  Nursing note and vitals reviewed. Constitutional: He is oriented to person, place, and time. He appears well-developed and well-nourished.  HENT:  Head: Normocephalic and atraumatic.  Eyes: EOM are normal. Pupils are equal, round, and reactive to light.  Neck: Normal range of  motion. Neck supple.  Cardiovascular: Normal rate, regular rhythm and normal heart sounds.   No murmur heard. Pulmonary/Chest: Effort normal and breath sounds normal.  Abdominal: Soft. Bowel sounds are normal. He exhibits no distension and no mass. There is tenderness. There is no rebound and no guarding.  Midline upper abdominal tenderness without rebound or guarding. Possible mild fullness  Musculoskeletal: Normal range of motion. He exhibits no edema.  Dialysis graft left forearm.  Neurological: He is alert and oriented to person, place, and time. No cranial nerve deficit.  Skin: Skin is warm and dry. There is pallor.  Psychiatric: He has a normal mood and affect.    ED Course  Procedures (including critical care time) Labs Review Labs Reviewed  CBC WITH DIFFERENTIAL - Abnormal; Notable for the following:    RBC 1.95 (*)    Hemoglobin 6.0 (*)    HCT 18.2 (*)    RDW 16.1 (*)    All other components within normal limits  COMPREHENSIVE METABOLIC PANEL - Abnormal; Notable for the following:    Potassium 3.1 (*)     Chloride 93 (*)    Glucose, Bld 136 (*)    BUN 36 (*)    Creatinine, Ser 7.25 (*)    ALT 64 (*)    Alkaline Phosphatase 203 (*)    Total Bilirubin 0.2 (*)    GFR calc non Af Amer 8 (*)    GFR calc Af Amer 9 (*)    Anion gap 17 (*)    All other components within normal limits  LIPASE, BLOOD  HAPTOGLOBIN  LACTATE DEHYDROGENASE  VITAMIN B12  FOLATE  IRON AND TIBC  FERRITIN  RETICULOCYTES  PROTIME-INR  I-STAT TROPOININ, ED  TYPE AND SCREEN    Imaging Review No results found.   EKG Interpretation   Date/Time:  Thursday February 20 2014 12:26:36 EDT Ventricular Rate:  104 PR Interval:  175 QRS Duration: 91 QT Interval:  375 QTC Calculation: 493 R Axis:   15 Text Interpretation:  Sinus tachycardia Nonspecific T abnormalities,  lateral leads Borderline prolonged QT interval Confirmed by Alvino Chapel  MD,  Ovid Curd [phone]) on 02/20/2014 2:35:05 PM      MDM   Final diagnoses:  Anemia due to other cause    Patient presents with shortness of breath and fatigue. This is a history of anemia and his hemoglobin is 6 year. Is down from 7 week ago. It has been renal cause in the past. Mild hypotension. Will admit to internal medicine internal medicine clinic saw the patient just under a month ago and will be returned to their service    Livingston Wheeler. Alvino Chapel, MD 02/20/14 1439

## 2014-02-21 ENCOUNTER — Inpatient Hospital Stay (HOSPITAL_COMMUNITY): Payer: Medicare Other

## 2014-02-21 DIAGNOSIS — Z720 Tobacco use: Secondary | ICD-10-CM

## 2014-02-21 DIAGNOSIS — I5032 Chronic diastolic (congestive) heart failure: Secondary | ICD-10-CM

## 2014-02-21 DIAGNOSIS — I480 Paroxysmal atrial fibrillation: Secondary | ICD-10-CM

## 2014-02-21 DIAGNOSIS — J449 Chronic obstructive pulmonary disease, unspecified: Secondary | ICD-10-CM

## 2014-02-21 DIAGNOSIS — N186 End stage renal disease: Secondary | ICD-10-CM

## 2014-02-21 DIAGNOSIS — D6489 Other specified anemias: Secondary | ICD-10-CM

## 2014-02-21 DIAGNOSIS — R569 Unspecified convulsions: Secondary | ICD-10-CM

## 2014-02-21 DIAGNOSIS — I10 Essential (primary) hypertension: Secondary | ICD-10-CM

## 2014-02-21 LAB — BASIC METABOLIC PANEL
Anion gap: 20 — ABNORMAL HIGH (ref 5–15)
BUN: 60 mg/dL — AB (ref 6–23)
CO2: 24 mEq/L (ref 19–32)
CREATININE: 10.42 mg/dL — AB (ref 0.50–1.35)
Calcium: 10.1 mg/dL (ref 8.4–10.5)
Chloride: 93 mEq/L — ABNORMAL LOW (ref 96–112)
GFR calc Af Amer: 6 mL/min — ABNORMAL LOW (ref >90)
GFR, EST NON AFRICAN AMERICAN: 5 mL/min — AB (ref >90)
Glucose, Bld: 92 mg/dL (ref 70–99)
Potassium: 4.6 mEq/L (ref 3.7–5.3)
Sodium: 137 mEq/L (ref 137–147)

## 2014-02-21 LAB — CBC
HEMATOCRIT: 24 % — AB (ref 39.0–52.0)
Hemoglobin: 7.9 g/dL — ABNORMAL LOW (ref 13.0–17.0)
MCH: 31.2 pg (ref 26.0–34.0)
MCHC: 32.9 g/dL (ref 30.0–36.0)
MCV: 94.9 fL (ref 78.0–100.0)
Platelets: 228 10*3/uL (ref 150–400)
RBC: 2.53 MIL/uL — ABNORMAL LOW (ref 4.22–5.81)
RDW: 15.8 % — ABNORMAL HIGH (ref 11.5–15.5)
WBC: 7.4 10*3/uL (ref 4.0–10.5)

## 2014-02-21 LAB — HEPATITIS C ANTIBODY: HCV Ab: NEGATIVE

## 2014-02-21 LAB — RETICULOCYTES
RBC.: 2.53 MIL/uL — ABNORMAL LOW (ref 4.22–5.81)
Retic Count, Absolute: 75.9 10*3/uL (ref 19.0–186.0)
Retic Ct Pct: 3 % (ref 0.4–3.1)

## 2014-02-21 LAB — HEPATITIS B SURFACE ANTIBODY,QUALITATIVE: HEP B S AB: POSITIVE — AB

## 2014-02-21 LAB — HEPATITIS B CORE ANTIBODY, TOTAL: HEP B C TOTAL AB: NONREACTIVE

## 2014-02-21 LAB — HEPATITIS B SURFACE ANTIGEN: HEP B S AG: NEGATIVE

## 2014-02-21 MED ORDER — DARBEPOETIN ALFA-POLYSORBATE 200 MCG/0.4ML IJ SOLN: 200.0000 ug | INTRAMUSCULAR | Status: DC

## 2014-02-21 MED ORDER — DOXERCALCIFEROL 4 MCG/2ML IV SOLN
4.0000 ug | INTRAVENOUS | Status: DC
  Administered 2014-02-22: 4 ug via INTRAVENOUS
  Filled 2014-02-21: qty 2

## 2014-02-21 MED ORDER — SODIUM CHLORIDE 0.9 % IV SOLN
62.5000 mg | INTRAVENOUS | Status: DC
  Administered 2014-02-22: 62.5 mg via INTRAVENOUS
  Filled 2014-02-21 (×2): qty 5

## 2014-02-21 NOTE — H&P (Signed)
Attending physician admission note: I have personally interviewed and examined this patient and reviewed the pertinent lab data base and radiographs. I concur with the evaluation and management plan as outlined by resident physician Dr. Alexa Richardson.  Clinical history: 48-year-old man with end-stage renal disease on dialysis for the last 2-1/2 years. 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 He continues to use this drug. He is chronically anemic with baseline hemoglobin is running around 12 g, normal MCV, and I assume he is receiving erythropoietin stimulating agents as part of his dialysis program. Since June of this year there has been a abrupt fall in his hemoglobin which now runs 7-8 g. He has had intermittent dips in his platelet count over the same interval but nothing lower than 110,000. Platelet count admission was 260,000. He presented for a routine dialysis treatment on the day of the current admission and was found to have a hemoglobin of 6 g. Not surprisingly, he complained of increasing fatigue and dyspnea with minimal exertion.  Evaluation so far does not show any evidence for hemolysis with a negative Coombs test, normal LDH of 178, no elevation of the reticulocyte count when corrected for his degree of anemia, normal bilirubin 0.2, and a normal haptoglobin. Ferritin is elevated at 1290 reflecting recent administration of parenteral iron. He has had no signs of active bleeding he does have active gastritis symptoms.  Current exam: Blood pressure 121/73, pulse 96, temperature 98.6 F (37 C), temperature source Oral, resp. rate 17, height 6' (1.829 m), weight 167 lb (75.751 kg), SpO2 99.00%. Vascular graft left forearm. No cardiac murmurs. No lymphadenopathy. Very tender in the epigastrium. No palpable mass or organomegaly. No rash or ecchymosis.  Impression: #1. Acute on  chronic normochromic anemia Etiology unclear at present. No evidence for either hemolysis, iron deficiency, or vitamin deficiency. No obvious acute or subacute bleeding. I'm not sure whether ongoing cocaine use or other substance abuse is affecting his bone marrow function. He is taking a number of nutritional supplements and there is always a potential that these are causing bone marrow toxicity. For now we will transfuse to a stable hemoglobin. Very rarely, people can develop antibodies against erythropoietin. Tests for Antibodies against erythropoietin are difficult to obtain enough to be sent to special reference labs. Hepatitis and HIV testing negative. We may need to consider bone marrow biopsy if he will allow it and we have no other reason for his anemia.  #2. Gastritis./Esophagitis. Upper endoscopy in January of this year showed signs of esophagitis. He has some symptoms of dysphagia but more of a sensation that food is getting stuck in the epigastric region. We are going to get a barium swallow to make sure he has no obstruction, stricture, or ulcer. Continue PPIs.  #3. Severe and chronic migraine headaches  #4. Ongoing polysubstance abuse  #5. End-stage renal disease with necrotizing glomerulonephritis secondary to #4.  #6. History of paroxysmal atrial fibrillation currently in sinus rhythm  #7. Chronic diastolic heart failure  #8. History of a seizure.  #9. Hypertension  James Granfortuna, MD, FACP  Hematology-Oncology/Internal Medicine     

## 2014-02-21 NOTE — Progress Notes (Signed)
Subjective:  Patient was seen and examined this morning. Patient states his weakness and fatigue is lightly improved. He feels strong enough to walk around. Patient continues to have epigastric discomfort, bloating with a new description of dysphagia. Patient states his pills and food seems to get stuck "at the bottom." He denies trouble swallowing liquids.   Objective: Vital signs in last 24 hours: Filed Vitals:   02/21/14 0231 02/21/14 0546 02/21/14 0600 02/21/14 0920  BP: 105/79 128/86 118/97 129/88  Pulse: 99 91 99 94  Temp: 98.3 F (36.8 C) 98.8 F (37.1 C) 98.6 F (37 C) 98.1 F (36.7 C)  TempSrc: Oral Oral Oral Oral  Resp: 18 18 18 18   Height:      Weight:      SpO2: 98% 97% 99% 100%   Weight change:   Intake/Output Summary (Last 24 hours) at 02/21/14 1251 Last data filed at 02/21/14 0848  Gross per 24 hour  Intake    769 ml  Output      0 ml  Net    769 ml    General: Vital signs reviewed. Patient is well-developed and well-nourished, in no acute distress and cooperative with exam.  Cardiovascular: RRR, S1 normal, S2 normal, no murmurs, gallops, or rubs.  Pulmonary/Chest: Clear to auscultation bilaterally, no wheezes, rales, or rhonchi.  Abdominal: Tight with voluntary guarding, mildly tender, non-distended, BS +, no masses, organomegaly.  Musculoskeletal: No joint deformities, erythema, or stiffness, ROM full and nontender.  Extremities: No lower extremity edema bilaterally, pulses symmetric and intact bilaterally. No cyanosis or clubbing. Neurological: Strength is normal and symmetric bilaterally, 5/5.  Skin: Pallor. No rashes or erythema. Psychiatric: Normal mood and affect. speech and behavior is normal. Cognition and memory are normal.   Lab Results: Basic Metabolic Panel:  Recent Labs Lab 02/20/14 1305 02/21/14 0758  NA 139 137  K 3.1* 4.6  CL 93* 93*  CO2 29 24  GLUCOSE 136* 92  BUN 36* 60*  CREATININE 7.25* 10.42*  CALCIUM 9.5 10.1   Liver  Function Tests:  Recent Labs Lab 02/20/14 1305  AST 33  ALT 64*  ALKPHOS 203*  BILITOT 0.2*  PROT 7.8  ALBUMIN 3.5    Recent Labs Lab 02/20/14 1305  LIPASE 17   CBC:  Recent Labs Lab 02/20/14 1305 02/21/14 0758  WBC 6.3 7.4  NEUTROABS 4.8  --   HGB 6.0* 7.9*  HCT 18.2* 24.0*  MCV 93.3 94.9  PLT 260 228   Coagulation:  Recent Labs Lab 02/20/14 1441  LABPROT 16.1*  INR 1.29   Anemia Panel:  Recent Labs Lab 02/20/14 1441 02/21/14 0758  VITAMINB12 786  --   FOLATE >20.0  --   FERRITIN 1290*  --   TIBC 223  --   IRON 96  --   RETICCTPCT 3.0 3.0   Urine Drug Screen: Drugs of Abuse     Component Value Date/Time   LABOPIA NEGATIVE 11/05/2011 1441   COCAINSCRNUR POSITIVE* 11/05/2011 1441   LABBENZ NEGATIVE 11/05/2011 1441   AMPHETMU NEGATIVE 11/05/2011 1441    Medications:  I have reviewed the patient's current medications. Prior to Admission:  Prescriptions prior to admission  Medication Sig Dispense Refill  . acetaminophen (TYLENOL) 500 MG tablet Take 1,000 mg by mouth every 6 (six) hours as needed for mild pain.       Marland Kitchen amLODipine (NORVASC) 10 MG tablet Take 1 tablet (10 mg total) by mouth daily.  30 tablet  1  .  aspirin 81 MG chewable tablet Chew 1 tablet (81 mg total) by mouth daily.  30 tablet  11  . carvedilol (COREG) 25 MG tablet Take 1.5 tablets (37.5 mg total) by mouth 2 (two) times daily with a meal. On Monday-Wednesday-Friday-Sunday (non-HD days); on dialysis days (Tuesday-Thursday-Saturday) take it once a day (evening time only).  90 tablet  2  . cinacalcet (SENSIPAR) 30 MG tablet Take 30 mg by mouth daily.      . cloNIDine (CATAPRES) 0.2 MG tablet Take 1 tablet (0.2 mg total) by mouth 2 (two) times daily.  60 tablet  2  . diphenhydrAMINE (BENADRYL) 25 MG tablet Take 25 mg by mouth every 6 (six) hours as needed for itching.      . famotidine (PEPCID) 20 MG tablet Take 1 tablet (20 mg total) by mouth daily.  30 tablet  1  . losartan (COZAAR)  100 MG tablet Take 100 mg by mouth at bedtime.      . multivitamin (RENA-VIT) TABS tablet Take 1 tablet by mouth at bedtime.      . Nutritional Supplements (FEEDING SUPPLEMENT, NEPRO CARB STEADY,) LIQD Take 237 mLs by mouth 3 (three) times daily as needed (Supplement).    0  . pantoprazole (PROTONIX) 40 MG tablet Take 1 tablet (40 mg total) by mouth daily.  30 tablet  1  . sevelamer carbonate (RENVELA) 800 MG tablet Take 2 tablets (1,600 mg total) by mouth 3 (three) times daily with meals.  90 tablet  0  . topiramate (TOPAMAX) 25 MG tablet Take 1 tablet (25 mg total) by mouth at bedtime.  60 tablet  6   Scheduled Meds: . aspirin  81 mg Oral Daily  . carvedilol  37.5 mg Oral Q supper  . carvedilol  37.5 mg Oral Once per day on Sun Mon Wed Fri  . cinacalcet  30 mg Oral Q supper  . cloNIDine  0.2 mg Oral BID  . gi cocktail  30 mL Oral Once  . multivitamin  1 tablet Oral QHS  . pantoprazole  40 mg Oral Daily  . sevelamer carbonate  1,600 mg Oral TID WC  . sodium chloride  3 mL Intravenous Q12H  . topiramate  25 mg Oral QHS   Continuous Infusions:  PRN Meds:.sodium chloride, acetaminophen, feeding supplement (NEPRO CARB STEADY), sodium chloride Assessment/Plan: Principal Problem:   Symptomatic anemia Active Problems:   Tobacco abuse   HTN (hypertension)   Seizure disorder   ESRD on hemodialysis   PAF (paroxysmal atrial fibrillation)   Chronic diastolic congestive heart failure   COPD (chronic obstructive pulmonary disease)  Symptomatic Anemia: Hemoglobin 7.9 this morning. Recent labs show haptoglobin elevated at 246, ferritin elevated at 1290. The following were normal: LDH, vitamin B12, folate, iron, TIBC, saturation ratio. Reticulocyte count elevated at 3.0. Will need to check with Renal to make sure patient has been receiving Aranesp or Epogen with dialysis. -CBC/BMET tomorrow am  Esophagitis versus Esophageal Stricture: Patient complains of dysphagia, food getting stuck, bloating,  epigastric discomfort. Patient previously diagnosed with esophagitis on EGD in January 2015. Patient is on protonix 40 mg daily and famotidine 20 mg daily at home. Worsening symptoms over the past 4 days. Will obtain Modified Barium Swallow. -Modified Barium Swallow -Protoxin 40 mg daily  Elevated Liver Enzymes: Alk Phos and ALT elevated. AST and T. Bili normal. Previously normal in September 2015. Patient was HIV negative one month ago. Hep A, B, and C negative in 2013. Patient denies alcohol use or  IV drug use. Ordered Hepatitis B surface antigen, Hepatitis B core antibody, Hepatitis B surface antigen, Hepatitis C antibody for elevated Alk Phos and ALT. Ordered Abdominal Ultrasound to rule out liver, gallbladder and pancreas pathology. Patient will need to be NPO for 6 hours prior to the exam.  -Protonix 40 mg daily  -CMET/CBC tomorrow morning  -Hepatitis Panel -Abdominal Ultrasound  ESRD on Dialysis TTS: Nephrology has been consulted who will see the patient. Next dialysis on Saturday. We will need to make sure patient has been receiving Aranesp or Epogen with dialysis. -Nephrology following -Renal Diet  -Sensipar  -Renvela  -Topamax for dialysis induced headaches   HTN: Patient is currently normotensive. We will monitor and continue his current home medications.  -Carvedilol 37.5 mg BID, except QD on TTS  -Clonidine 0.2 mg BID   PAF: Patient is currently in sinus rhythm.  -ASA 81 mg daily  -Carvedilol 37.5 mg BID, except QD on TTS   DVT/PE ppx: SCDs  Dispo: Disposition is deferred at this time, awaiting improvement of current medical problems.  Anticipated discharge in approximately 1-2 day(s).   The patient does have a current PCP Leamon Arnt, MD) and does not need an [facility] hospital follow-up appointment after discharge.  The patient does not have transportation limitations that hinder transportation to clinic appointments.  .Services Needed at time of discharge: Y = Yes,  Blank = No PT:   OT:   RN:   Equipment:   Other:     LOS: 1 day   Osa Craver, DO PGY-1 Internal Medicine Resident Pager # [phone] 02/21/2014 12:51 PM

## 2014-02-21 NOTE — Consult Note (Signed)
[facility] Renal Consultation Note  Indication for Consultation:  Management of ESRD/hemodialysis; anemia, hypertension/volume and secondary hyperparathyroidism  HPI: Jeremy Mccoy is a 48 y.o. male admitted yesterday after Hemodialysis reported Abdominal discomfort and  felt extremely weak with fatigue progressing over past 48hrs  and some SOB with any exertion. He came to ER found to have HGB 6.0 with Negative FOBT.He has a known History of Anemia Of Chronic Kidney diseae and also Iron Deficiency with HGB 02/13/14 = 7.7 and Tsat 14% startered Venofer loading 10/6 and was already on max Aranesp q hd. Prior hgb 6.9 and sent to er for Transfusion but Hgb in ER in 7s' and not transfused. Ho of some noncompliance with hd and  thus missing  Aranesop dosing but so far in October more compliant ".Last used cocaine several weeks ago and drinks 1 to 2 beers a week".  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, prob levamisole vasculitis, COPD, tobacco, cocaine abuse, anemia, HTN , HPTH  13 - hyperkalemia, acute resp failure, polysubstance abuse, ESRD  1/14 - hyperkalemia, resp failure, vol excessk, ESRD, cocaine abuse  5/14 - vol overload, missed HD, tobacco, hyperkalemia, ESRD on HD  12/14 - hyperkalemia, esophagitis by EGD, HD  06/12/13 - PRES w status epilepticus rx with intubation and antiepileptics, was dc'd on two seizure medications, ESRD on HD  11/04/13 - resp distress and hypoxemia, missed HD, ESRD , treated with HD  9/8- 01/28/14 > HTN'sive emergency (resolved), acute resp failure with pulm edema, afib with RVR, tobacco/cocaine abuse, ESRD on HD, seizure d/o, high K, hx levamisole vasculitis > rx with nicard gtt, BP meds, HD. Afib w low CHADS2 , seen by cardiology rx with Coreg, and ASA for anticoagulation. Dialysis- assoc headache chronic issue, not typical migraines.     Past Medical History  Diagnosis Date  . COPD (chronic obstructive pulmonary disease)   .  Crack cocaine use   . Dental caries   . HTN (hypertension) 06/12/2012  . Hematemesis/vomiting blood   . Shortness of breath     "just when I have too much potassium is too high" (05/15/2013)  . Anemia   . History of blood transfusion 10/2011; 04/2013  . Headache(784.0)     "get it from going to dialysis; when they get real bad I come to hospital" (05/15/2013)  . Arthritis     "qwhere" (05/15/2013)  . End stage renal disease 01/11/2012    Patient presented 11/04/11 to hospital with CP, wt loss and N/V. Creat was 10, admitted. 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 ANA was + 1:80, +MPO and +pr3 Ab's (ANCA). Received plasmapheresis x 7, IV cytoxan and pred taper. First HD was 11/17/11. 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 Pt d/c'd to do outpt HD and may have gotten 1-2 additional Cytoxan as OP, but this was stopped eventually since renal function didn't recover. Now gets HD TTS schedule at [facility].  L forearm AVF 11/19/11 by Dr. Scot Dock is current access.    Marland Kitchen ESRD (end stage renal disease) on dialysis     TTS: [address] [place] (05/15/2013)  . ESRD (end stage renal disease) on dialysis   . Headache(784.0)   . Depression   . Anxiety     Past Surgical History  Procedure Laterality Date  . Av fistula placement  11/29/2011  Procedure: ARTERIOVENOUS (AV) FISTULA CREATION;  Surgeon: Angelia Mould, MD;  Location: [facility];  Service: Vascular;  Laterality: Left;  . Renal biopsy  11/07/2011  . Inguinal hernia repair Bilateral 1967  . Esophagogastroduodenoscopy N/A 05/17/2013    Procedure: ESOPHAGOGASTRODUODENOSCOPY (EGD);  Surgeon: Beryle Beams, MD;  Location: [facility] ENDOSCOPY;  Service: Endoscopy;  Laterality: N/A;      Family History  Problem Relation Age of Onset  . Heart attack Father       reports that he has been smoking Cigars and Cigarettes.  He  has a 35 pack-year smoking history. He has never used smokeless tobacco. He reports that he uses illicit drugs (Marijuana and Cocaine). He reports that he does not drink alcohol.  No Known Allergies  Prior to Admission medications   Medication Sig Start Date End Date Taking? Authorizing Provider  acetaminophen (TYLENOL) 500 MG tablet Take 1,000 mg by mouth every 6 (six) hours as needed for mild pain.    Yes Historical Provider, MD  amLODipine (NORVASC) 10 MG tablet Take 1 tablet (10 mg total) by mouth daily. 01/25/14  Yes Cresenciano Genre, MD  aspirin 81 MG chewable tablet Chew 1 tablet (81 mg total) by mouth daily. 01/25/14  Yes Cresenciano Genre, MD  carvedilol (COREG) 25 MG tablet Take 1.5 tablets (37.5 mg total) by mouth 2 (two) times daily with a meal. On Monday-Wednesday-Friday-Sunday (non-HD days); on dialysis days (Tuesday-Thursday-Saturday) take it once a day (evening time only). 01/30/14  Yes Mihai Croitoru, MD  cinacalcet (SENSIPAR) 30 MG tablet Take 30 mg by mouth daily.   Yes Historical Provider, MD  cloNIDine (CATAPRES) 0.2 MG tablet Take 1 tablet (0.2 mg total) by mouth 2 (two) times daily. 11/08/13  Yes Reyne Dumas, MD  diphenhydrAMINE (BENADRYL) 25 MG tablet Take 25 mg by mouth every 6 (six) hours as needed for itching.   Yes Historical Provider, MD  famotidine (PEPCID) 20 MG tablet Take 1 tablet (20 mg total) by mouth daily. 01/25/14  Yes Cresenciano Genre, MD  losartan (COZAAR) 100 MG tablet Take 100 mg by mouth at bedtime.   Yes Historical Provider, MD  multivitamin (RENA-VIT) TABS tablet Take 1 tablet by mouth at bedtime. 11/29/11  Yes Myriam Jacobson, PA-C  Nutritional Supplements (FEEDING SUPPLEMENT, NEPRO CARB STEADY,) LIQD Take 237 mLs by mouth 3 (three) times daily as needed (Supplement). 05/17/13  Yes Maryann Mikhail, DO  pantoprazole (PROTONIX) 40 MG tablet Take 1 tablet (40 mg total) by mouth daily. 01/25/14  Yes Cresenciano Genre, MD  sevelamer carbonate (RENVELA) 800 MG tablet Take 2  tablets (1,600 mg total) by mouth 3 (three) times daily with meals. 01/25/14  Yes Cresenciano Genre, MD  topiramate (TOPAMAX) 25 MG tablet Take 1 tablet (25 mg total) by mouth at bedtime. 01/29/14  Yes Hulen Luster, DO     Anti-infectives   None      Results for orders placed during the hospital encounter of 02/20/14 (from the past 48 hour(s))  CBC WITH DIFFERENTIAL     Status: Abnormal   Collection Time    02/20/14  1:05 PM      Result Value Ref Range   WBC 6.3  4.0 - 10.5 K/uL   RBC 1.95 (*) 4.22 - 5.81 MIL/uL   Hemoglobin 6.0 (*) 13.0 - 17.0 g/dL   Comment: REPEATED TO VERIFY     CRITICAL RESULT CALLED TO, READ BACK BY AND VERIFIED WITH:     A CORBETT,RN  1346 02/20/14 D BRADLEY   HCT 18.2 (*) 39.0 - 52.0 %   MCV 93.3  78.0 - 100.0 fL   MCH 30.8  26.0 - 34.0 pg   MCHC 33.0  30.0 - 36.0 g/dL   RDW 16.1 (*) 11.5 - 15.5 %   Platelets 260  150 - 400 K/uL   Neutrophils Relative % 76  43 - 77 %   Neutro Abs 4.8  1.7 - 7.7 K/uL   Lymphocytes Relative 17  12 - 46 %   Lymphs Abs 1.1  0.7 - 4.0 K/uL   Monocytes Relative 5  3 - 12 %   Monocytes Absolute 0.3  0.1 - 1.0 K/uL   Eosinophils Relative 1  0 - 5 %   Eosinophils Absolute 0.1  0.0 - 0.7 K/uL   Basophils Relative 0  0 - 1 %   Basophils Absolute 0.0  0.0 - 0.1 K/uL  COMPREHENSIVE METABOLIC PANEL     Status: Abnormal   Collection Time    02/20/14  1:05 PM      Result Value Ref Range   Sodium 139  137 - 147 mEq/L   Potassium 3.1 (*) 3.7 - 5.3 mEq/L   Chloride 93 (*) 96 - 112 mEq/L   CO2 29  19 - 32 mEq/L   Glucose, Bld 136 (*) 70 - 99 mg/dL   BUN 36 (*) 6 - 23 mg/dL   Creatinine, Ser 7.25 (*) 0.50 - 1.35 mg/dL   Calcium 9.5  8.4 - 10.5 mg/dL   Total Protein 7.8  6.0 - 8.3 g/dL   Albumin 3.5  3.5 - 5.2 g/dL   AST 33  0 - 37 U/L   ALT 64 (*) 0 - 53 U/L   Alkaline Phosphatase 203 (*) 39 - 117 U/L   Total Bilirubin 0.2 (*) 0.3 - 1.2 mg/dL   GFR calc non Af Amer 8 (*) >90 mL/min   GFR calc Af Amer 9 (*) >90 mL/min    Comment: (NOTE)     The eGFR has been calculated using the CKD EPI equation.     This calculation has not been validated in all clinical situations.     eGFR's persistently <90 mL/min signify possible Chronic Kidney     Disease.   Anion gap 17 (*) 5 - 15  LIPASE, BLOOD     Status: None   Collection Time    02/20/14  1:05 PM      Result Value Ref Range   Lipase 17  11 - 59 U/L  I-STAT TROPOININ, ED     Status: None   Collection Time    02/20/14  1:11 PM      Result Value Ref Range   Troponin i, poc 0.01  0.00 - 0.08 ng/mL   Comment 3            Comment: Due to the release kinetics of cTnI,     a negative result within the first hours     of the onset of symptoms does not rule out     myocardial infarction with certainty.     If myocardial infarction is still suspected,     repeat the test at appropriate intervals.  TYPE AND SCREEN     Status: None   Collection Time    02/20/14  2:15 PM      Result Value Ref Range   ABO/RH(D) O POS     Antibody Screen POS  Sample Expiration 02/23/2014     DAT, IgG NEG     Antibody Identification ANTI S ANTI E     Unit Number [id number]     Blood Component Type RED CELLS,LR     Unit division 00     Status of Unit ISSUED     Donor AG Type       Value: NEGATIVE FOR E ANTIGEN NEGATIVE FOR c ANTIGEN NEGATIVE FOR S ANTIGEN   Transfusion Status OK TO TRANSFUSE     Crossmatch Result COMPATIBLE     Unit Number [id number]     Blood Component Type RBC LR PHER1     Unit division 00     Status of Unit ISSUED,FINAL     Donor AG Type       Value: NEGATIVE FOR E ANTIGEN NEGATIVE FOR c ANTIGEN NEGATIVE FOR S ANTIGEN   Transfusion Status OK TO TRANSFUSE     Crossmatch Result COMPATIBLE    PREPARE RBC (CROSSMATCH)     Status: None   Collection Time    02/20/14  2:15 PM      Result Value Ref Range   Order Confirmation ORDER PROCESSED BY BLOOD BANK    HAPTOGLOBIN     Status: Abnormal   Collection Time    02/20/14  2:41 PM      Result Value  Ref Range   Haptoglobin 246 (*) 45 - 215 mg/dL   Comment: Performed at [facility] DEHYDROGENASE     Status: None   Collection Time    02/20/14  2:41 PM      Result Value Ref Range   LDH 178  94 - 250 U/L  VITAMIN B12     Status: None   Collection Time    02/20/14  2:41 PM      Result Value Ref Range   Vitamin B-12 786  211 - 911 pg/mL   Comment: Performed at [facility]     Status: None   Collection Time    02/20/14  2:41 PM      Result Value Ref Range   Folate >20.0     Comment: (NOTE)     Reference Ranges            Deficient:       0.4 - 3.3 ng/mL            Indeterminate:   3.4 - 5.4 ng/mL            Normal:              > 5.4 ng/mL     Performed at [facility]  IRON AND TIBC     Status: None   Collection Time    02/20/14  2:41 PM      Result Value Ref Range   Iron 96  42 - 135 ug/dL   TIBC 223  215 - 435 ug/dL   Saturation Ratios 43  20 - 55 %   UIBC 127  125 - 400 ug/dL   Comment: Performed at [facility]     Status: Abnormal   Collection Time    02/20/14  2:41 PM      Result Value Ref Range   Ferritin 1290 (*) 22 - 322 ng/mL   Comment: Performed at [facility]     Status: Abnormal   Collection Time    02/20/14  2:41 PM  Result Value Ref Range   Retic Ct Pct 3.0  0.4 - 3.1 %   RBC. 1.97 (*) 4.22 - 5.81 MIL/uL   Retic Count, Manual 59.1  19.0 - 186.0 K/uL  PROTIME-INR     Status: Abnormal   Collection Time    02/20/14  2:41 PM      Result Value Ref Range   Prothrombin Time 16.1 (*) 11.6 - 15.2 seconds   INR 1.29  0.00 - 1.49  POC OCCULT BLOOD, ED     Status: None   Collection Time    02/20/14  2:59 PM      Result Value Ref Range   Fecal Occult Bld NEGATIVE  NEGATIVE  CBC     Status: Abnormal   Collection Time    02/21/14  7:58 AM      Result Value Ref Range   WBC 7.4  4.0 - 10.5 K/uL   RBC 2.53 (*) 4.22 - 5.81 MIL/uL   Hemoglobin 7.9 (*) 13.0 - 17.0 g/dL    Comment: POST TRANSFUSION SPECIMEN   HCT 24.0 (*) 39.0 - 52.0 %   MCV 94.9  78.0 - 100.0 fL   MCH 31.2  26.0 - 34.0 pg   MCHC 32.9  30.0 - 36.0 g/dL   RDW 15.8 (*) 11.5 - 15.5 %   Platelets 228  150 - 400 K/uL  BASIC METABOLIC PANEL     Status: Abnormal   Collection Time    02/21/14  7:58 AM      Result Value Ref Range   Sodium 137  137 - 147 mEq/L   Potassium 4.6  3.7 - 5.3 mEq/L   Chloride 93 (*) 96 - 112 mEq/L   CO2 24  19 - 32 mEq/L   Glucose, Bld 92  70 - 99 mg/dL   BUN 60 (*) 6 - 23 mg/dL   Creatinine, Ser 10.42 (*) 0.50 - 1.35 mg/dL   Calcium 10.1  8.4 - 10.5 mg/dL   GFR calc non Af Amer 5 (*) >90 mL/min   GFR calc Af Amer 6 (*) >90 mL/min   Comment: (NOTE)     The eGFR has been calculated using the CKD EPI equation.     This calculation has not been validated in all clinical situations.     eGFR's persistently <90 mL/min signify possible Chronic Kidney     Disease.   Anion gap 20 (*) 5 - 15  RETICULOCYTES     Status: Abnormal   Collection Time    02/21/14  7:58 AM      Result Value Ref Range   Retic Ct Pct 3.0  0.4 - 3.1 %   RBC. 2.53 (*) 4.22 - 5.81 MIL/uL   Retic Count, Manual 75.9  19.0 - 186.0 K/uL    ROS: only positives in HPI Physical Exam: Filed Vitals:   02/21/14 0920  BP: 129/88  Pulse: 94  Temp: 98.1 F (36.7 C)  Resp: 18     General: Alert pale WM NAD cooperative HEENT: [place] slightly dry MMM and pale Neck: NO JVD Heart: RRR Lungs: CTA bilat Abdomen:  BS pos , soft , nontender Extremities: No pedal edema Skin: pale, no overt rash  Neuro: Alert ,Ox3, no gross acute focal deficits noted Dialysis Access: pos bruit LA AVF  Dialysis Orders: Center: [facility]  on TTS . EDW 71.5kg HD Bath 1.0 k, 2.5ca  Time 4hrs 5mn Heparin none. Access LUAAVF BFR 450 DFR a1.5    Hectoral  4 mcg IV/HD , Aranesp 200 q thurs HD   Units IV/HD  Venofer  100 mg q hd  End 03/08/14 then 50 mg weekly  Other  hgb 7.7 02/13/14 and TFSat  14%  Assessment/Plan 1. Symptomatic Anemia = Hgb 6.0 on admit with neg FOBT/for transfusion 2 units PRBCs/ At ourt pt. Kid center on max dose Aranesp and noted low Iron sat 14 % 02/13/14 lab as op  And ordered venofer load started pass Tues 02/18/14 noted he has refused GI workup in past/ TS wu 2. ESRD -  TTS ([facility]) Hd in am  3. Epigastric Pain-  HO Esophagitis ( jan 2015 egd) 4. Hypertension/volume  - Using Home BP meds 2 of 4 listed Coreg 37.5 hold am on hd / Clonidine 0.68m bid/ Cozaar and Amlodipine hold ? Op compliance  With these.  5. Metabolic bone disease - Binder, Sensipar, IV Vit d on hd 6. HO Drug abuse ( Cocaine use)''last used a few weeks ago"  DErnest Haber PA-C [id number]/01/2014, 12:30 PM   Pt seen, examined and agree w A/P as above.  RKelly SplinterMD pager [phone]   cell [phone]/01/2014, 3:09 PM

## 2014-02-22 LAB — TYPE AND SCREEN
ABO/RH(D): O POS
ANTIBODY SCREEN: POSITIVE
DAT, IGG: NEGATIVE
Unit division: 0
Unit division: 0

## 2014-02-22 LAB — COMPREHENSIVE METABOLIC PANEL
ALT: 60 U/L — ABNORMAL HIGH (ref 0–53)
ANION GAP: 24 — AB (ref 5–15)
AST: 20 U/L (ref 0–37)
Albumin: 3.4 g/dL — ABNORMAL LOW (ref 3.5–5.2)
Alkaline Phosphatase: 171 U/L — ABNORMAL HIGH (ref 39–117)
BUN: 85 mg/dL — ABNORMAL HIGH (ref 6–23)
CO2: 22 mEq/L (ref 19–32)
Calcium: 9.8 mg/dL (ref 8.4–10.5)
Chloride: 91 mEq/L — ABNORMAL LOW (ref 96–112)
Creatinine, Ser: 13.56 mg/dL — ABNORMAL HIGH (ref 0.50–1.35)
GFR calc Af Amer: 4 mL/min — ABNORMAL LOW (ref >90)
GFR calc non Af Amer: 4 mL/min — ABNORMAL LOW (ref >90)
Glucose, Bld: 95 mg/dL (ref 70–99)
Potassium: 4.6 mEq/L (ref 3.7–5.3)
Sodium: 137 mEq/L (ref 137–147)
TOTAL PROTEIN: 7.1 g/dL (ref 6.0–8.3)
Total Bilirubin: 0.3 mg/dL (ref 0.3–1.2)

## 2014-02-22 LAB — CBC
HCT: 21.7 % — ABNORMAL LOW (ref 39.0–52.0)
Hemoglobin: 7.2 g/dL — ABNORMAL LOW (ref 13.0–17.0)
MCH: 31.3 pg (ref 26.0–34.0)
MCHC: 33.2 g/dL (ref 30.0–36.0)
MCV: 94.3 fL (ref 78.0–100.0)
PLATELETS: 188 10*3/uL (ref 150–400)
RBC: 2.3 MIL/uL — ABNORMAL LOW (ref 4.22–5.81)
RDW: 16.2 % — AB (ref 11.5–15.5)
WBC: 6.3 10*3/uL (ref 4.0–10.5)

## 2014-02-22 MED ORDER — DOXERCALCIFEROL 4 MCG/2ML IV SOLN
INTRAVENOUS | Status: DC
Start: 2014-02-22 — End: 2014-02-22
  Filled 2014-02-22: qty 2

## 2014-02-22 NOTE — Procedures (Signed)
I was present at this dialysis session, have reviewed the session itself and made  appropriate changes  Kelly Splinter MD (pgr) [phone]    [phone] 02/22/2014, 9:54 AM

## 2014-02-22 NOTE — Progress Notes (Signed)
Patient ID: Jeremy Mccoy, male   DOB: 10-15-1965, 48 y.o.   MRN: [id number] Medicine attending discharge note: I personally interviewed and examined this patient together with resident physician Dr. Johnnette Gourd and I concur with his evaluation and discharge plan. 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 He has had a chronic anemia with hemoglobins ranging in 11-12 range. However, over the last 7 months since March of this year he has had a fall from his baseline hemoglobin down to a new baseline of 7-8 g of unclear etiology. He has had chronic gastritis and esophagitis but the anemia is normochromic. Upper endoscopy in January 2015 remarkable only for esophagitis but no tumor or ulceration. He continues to have epigastric pain and a sense that food doesn't pass easily from his distal esophagus into the stomach. He was admitted at this time when there was a minor decrease in hemoglobin from his baseline of 7 g down to 6 g. He had a concomitant flare of epigastric pain. With respect to his GI complaints, his PPI regimen was intensified. Secondary to complaints of dysphagia, a barium swallow was done on October 9. Findings were mildly diminished primary peristalsis with occasional intermittent tertiary contractions. Mild smooth narrowing of the distal 8 cm of the esophagus with no gross stricture. Moderate reflux. Suspicion for chronic esophagitis. No mass or ulceration. Patient's symptoms did subside but not completely. He was eating solid food at time of discharge. With respect to his normochromic anemia, he was given a transfusion of 2 units of packed red cells with rise in hemoglobin from 6 g to 7.9 g with a value at discharge 7.2 g on October 10. There was no evidence for a hemolytic process with normal LDH, reticulocyte count, bilirubin, and haptoglobin. Vitamin T05 and folic acid levels were  normal. Ferritin was elevated at 1290 probably reflecting parenteral iron infusions given as an outpatient. Hepatitis acute profile was negative. Signs of previous immunization with positive hepatitis B surface antibody. Etiology of the change in his baseline hemoglobin remains unclear. Patient states he has been erratic about complying with his dialysis treatment so it is possible that he has not been receiving erythropoietin stimulating agents (Aranesp) on a regular basis. We considered toxic effects of ongoing substance abuse as possibly contributing to bone marrow suppression. We consider the remote possibility that he has developed antibodies against the Aranesp. I discussed the use of nutritional supplements with him. They always have the potential to harm since they are not FDA approved agents and I suggested that he stop them. We discussed that if the anemia is persistent and unexplained, a bone marrow biopsy would be indicated for further assessment. I offered to do the bone marrow biopsy prior to discharge but he preferred to wait until followup and reevaluation after he has been back on his regular Aranesp injections.  Murriel Hopper, MD, [place]  Hematology-Oncology/Internal Medicine

## 2014-02-22 NOTE — Discharge Summary (Signed)
Name: Jeremy Mccoy MRN: [id number] DOB: September 08, 1965 48 y.o. PCP: Leamon Arnt, MD  Date of Admission: 02/20/2014 12:30 PM Date of Discharge: 02/22/2014 Attending Physician: Annia Belt, MD  Discharge Diagnosis: Principal Problem:   Symptomatic anemia Active Problems:   Tobacco abuse   End stage renal disease   HTN (hypertension)   Seizure disorder   ESRD on hemodialysis   PAF (paroxysmal atrial fibrillation)   Chronic diastolic congestive heart failure   COPD (chronic obstructive pulmonary disease)  Discharge Medications:   Medication List         acetaminophen 500 MG tablet  Commonly known as:  TYLENOL  Take 1,000 mg by mouth every 6 (six) hours as needed for mild pain.     amLODipine 10 MG tablet  Commonly known as:  NORVASC  Take 1 tablet (10 mg total) by mouth daily.     aspirin 81 MG chewable tablet  Chew 1 tablet (81 mg total) by mouth daily.     carvedilol 25 MG tablet  Commonly known as:  COREG  Take 1.5 tablets (37.5 mg total) by mouth 2 (two) times daily with a meal. On Monday-Wednesday-Friday-Sunday (non-HD days); on dialysis days (Tuesday-Thursday-Saturday) take it once a day (evening time only).     cloNIDine 0.2 MG tablet  Commonly known as:  CATAPRES  Take 1 tablet (0.2 mg total) by mouth 2 (two) times daily.     diphenhydrAMINE 25 MG tablet  Commonly known as:  BENADRYL  Take 25 mg by mouth every 6 (six) hours as needed for itching.     famotidine 20 MG tablet  Commonly known as:  PEPCID  Take 1 tablet (20 mg total) by mouth daily.     feeding supplement (NEPRO CARB STEADY) Liqd  Take 237 mLs by mouth 3 (three) times daily as needed (Supplement).     losartan 100 MG tablet  Commonly known as:  COZAAR  Take 100 mg by mouth at bedtime.     multivitamin Tabs tablet  Take 1 tablet by mouth at bedtime.     pantoprazole 40 MG tablet  Commonly known as:  PROTONIX  Take 1 tablet (40 mg total) by mouth daily.     SENSIPAR 30 MG  tablet  Generic drug:  cinacalcet  Take 30 mg by mouth daily.     sevelamer carbonate 800 MG tablet  Commonly known as:  RENVELA  Take 2 tablets (1,600 mg total) by mouth 3 (three) times daily with meals.     topiramate 25 MG tablet  Commonly known as:  TOPAMAX  Take 1 tablet (25 mg total) by mouth at bedtime.        Disposition and follow-up:   Jeremy Mccoy was discharged from [facility] in Stable condition.  At the hospital follow up visit please address:  1.  Anemia: please continue to trend his Anemia if not improving with regular Aranesp and IV iron with dialysis may need to consider a BM biopsy and testing for antibodies to Aranesp.  2.  Labs / imaging needed at time of follow-up: CBC  3.  Pending labs/ test needing follow-up: None  Follow-up Appointments:     Follow-up Information   Follow up with ANDY,CAMILLE L, MD In 2 weeks. (for hospital follow up)    Specialty:  Family Medicine   Contact information:   [place] [address] [phone]       Discharge Instructions: Discharge Instructions  Call MD for:  extreme fatigue    Complete by:  As directed      Diet - low sodium heart healthy    Complete by:  As directed      Discharge instructions    Complete by:  As directed   Please take you medications as prescribed.  Follow up with your PCP for monitoring of you blood counts.     Increase activity slowly    Complete by:  As directed            Consultations: Treatment Team:  [name], MD  Procedures Performed:  Dg Chest 2 View  01/24/2014   CLINICAL DATA:  Respiratory failure  EXAM: CHEST  2 VIEW  COMPARISON:  January 22, 2014  FINDINGS: There has been interval clearing of interstitial edema. There is atelectatic change in the right and left lung bases. Elsewhere, the lungs are clear. The heart is mildly enlarged with pulmonary vascularity within normal limits. There is moderate peribronchial  thickening.  No adenopathy.  No bone lesions.  IMPRESSION: Currently no edema or consolidation. There is central interstitial thickening consistent with a degree of bronchitis. There is patchy bibasilar atelectatic change. Heart is mildly enlarged.   Electronically Signed   By: Lowella Grip M.D.   On: 01/24/2014 08:48   US Abdomen Complete  02/21/2014   CLINICAL DATA:  Epigastric pain  EXAM: ULTRASOUND ABDOMEN COMPLETE  COMPARISON:  None.  FINDINGS: Gallbladder: Gallbladder wall thickening measuring 5.5 mm. There are tiny echogenic foci along the gallbladder wall. There are no definite cholelithiasis. There is no pericholecystic fluid. There is a positive sonographic Murphy sign reported by the technologist.  Common bile duct: Diameter: 4.8 mm  Liver: No focal hepatic lesion. The liver is increased in size measuring 24 cm. There is increased hepatic parenchymal echogenicity.  IVC: No abnormality visualized.  Pancreas: Limited evaluation secondary to overlying bowel gas.  Spleen: Size and appearance within normal limits.  Right Kidney: Length: 10.2 cm. There is severe right renal cortical thinning. Echogenicity within normal limits. No mass or hydronephrosis visualized.  Left Kidney: Length: 10.8 cm. Echogenicity within normal limits. No mass or hydronephrosis visualized.  Abdominal aorta: No aneurysm visualized.  Other findings: None.  IMPRESSION: 1. Thickened gallbladder wall with echogenic foci along the gallbladder wall. This appearance can be seen with adenomyomatosis. 2. Severe right renal cortical thinning. 3. Hepatomegaly. Increased hepatic echogenicity as can be seen with hepatic steatosis. Correlate with liver function tests.   Electronically Signed   By: Kathreen Devoid   On: 02/21/2014 14:57   Dg Esophagus  02/21/2014   CLINICAL DATA:  Persistent dysphagia  EXAM: ESOPHOGRAM / BARIUM SWALLOW / BARIUM TABLET STUDY  TECHNIQUE: Combined double contrast and single contrast examination performed using  thick barium liquid and thin barium liquid. The patient was observed with fluoroscopy swallowing a 13 mm barium sulphate tablet.  FLUOROSCOPY TIME:  2 min 11 seconds  COMPARISON:  None.  FINDINGS: The patient swallows readily. There is mildly diminished primary peristalsis with occasional intermittent tertiary contractions. There is mild smooth narrowing of the distal 8 cm of the esophagus. No hiatal hernia is seen. There is reflux of a full column of barium to the mid esophagus which is slowed empty. There is no esophageal mass or ulceration. No appreciable mucosal irregularity seen. Pharynx appears normal. Barium tablet passes freely from the mouth to the stomach without appreciable hesitation.  IMPRESSION: Smooth narrowing of the distal 8 cm the esophagus  with moderate reflux. Suspect chronic esophagitis. No higher grade stricture seen. No mass or ulceration seen. No hiatal hernia. Diminished primary peristalsis.  Given concern for potential Barrett's esophagus given the narrowing and reflux in the distal esophageal region, direct visualization may be warranted to further assess.   Electronically Signed   By: Lowella Grip M.D.   On: 02/21/2014 16:06   Admission HPI: Mr. Bail is a 48 yo male with PMHx of ESRD on dialysis on TTS, COPD, cocaine abuse, HTN, anxiety/depression, anemia of chronic disease, and paroxysmal atrial fibrillation who presents to the ED with increasing fatigue and shortness of breath after receiving dialysis today. Patient states he has had increasing fatigue and weakness over the past couple of weeks. He states now he feels almost too weak and tired to walk across the room. He has a long history of anemia in the past and was even evaluated a couple weeks ago for anemia, but was not given any blood since his hemoglobin was 7.3. In the ED, patient's hemoglobin was 6.0. FOBT negative.  Patient also complains of epigastric tenderness radiating to the back that has been going on for 3  days. Pain is constant, achey, feels better with eating. Patient has felt bloated, nausea, but no vomiting, associated with gas, fullness, reflux. Patient cannot sleep because of the pain and discomfort. Patient used to drink alcohol, but quit 23 years ago. He only drinks a few drinks every few months. He denies overuse of NSAIDs. He has never had any abdominal surgeries nor has he ever had pancreatitis. Patient had an EGD in January of 2015 which showed grade A esophagitis, but no PUD. Patient has been on Famotidine 20 mg daily.   Hospital Course by problem list:   Symptomatic anemia - Patient presented with increased dyspnea and fatigue.  He has a history of chronic anemia and is ESRD on dialysis.  In the ED he was found to have a Hgb of 6.0 g/dL.  He was transfused 2u of PRBC with appropriate response. Workup for the cause of his acute on chronic normochromic anemia included a negative Coombs test, normal LDH of 178, no elevation of the reticulocyte count when corrected for his degree of anemia, normal bilirubin 0.2, and a normal haptoglobin. Ferritin is was at 1290 reflecting recent administration of parenteral iron. He has had no signs of active bleeding (FOBT negative) but he did have active gastritis symptoms.  Per nephrology he has been complaint with his regular Aranesp injections. A barium swallow was done on October 9. Findings were mildly diminished primary peristalsis with occasional intermittent tertiary contractions. Mild smooth narrowing of the distal 8 cm of the esophagus with no gross stricture. Moderate reflux. Suspicion for chronic esophagitis. No mass or ulceration. Patient's symptoms did subside but not completely. He was eating solid food at time of discharge. Given his unexplained worsening of his Anemia we discussed alternative possibilities such as toxic effects of his substance abuse causing bone marrow suppression.  We also discussed the possibility of antibodies against Aranesp but  this would be a very difficult test to collect and send off.  We did offer the patient a bone marrow biopsy to further evaluate however he preferred to wait until follow up and see how his blood count is doing over the next few weeks.  We did suggest he stop taking some of his nutritional supplements.    End stage renal disease - Nephrology was consulted and he was maintained on his normal dialysis  schedule.    PAF (paroxysmal atrial fibrillation) - Patient maintained normal sinus rhythm, continued on ASA and Carvedilol.    Chronic diastolic congestive heart failure - Not volume overloaded, continued on home medications.    COPD (chronic obstructive pulmonary disease) - No wheezing on exam.   Elevated Liver Enzymes: Alk Phos and ALT elevated. AST and T. Bili normal. Previously normal in September 2015. Patient was HIV negative one month ago. Hep A, B, and C negative in 2013. Patient denies alcohol use or IV drug use. Ordered Hepatitis B surface antigen, Hepatitis B core antibody, Hepatitis B surface antigen, Hepatitis C antibody for elevated Alk Phos and ALT. We obtained an Abdominal Ultrasound which revealed a thickened gallbladder with echogenic foci along the wall consistent with adenomyomatosis as well as hepatomegaly consistent with hepatic steatosis.  HTN: Patient normotensive on home medications.    Discharge Vitals:   BP 142/107  Pulse 93  Temp(Src) 97.6 F (36.4 C) (Oral)  Resp 18  Ht 6' (1.829 m)  Wt 164 lb 7.4 oz (74.6 kg)  BMI 22.30 kg/m2  SpO2 100%  Discharge Labs:  Results for orders placed during the hospital encounter of 02/20/14 (from the past 24 hour(s))  CBC     Status: Abnormal   Collection Time    02/22/14  5:00 AM      Result Value Ref Range   WBC 6.3  4.0 - 10.5 K/uL   RBC 2.30 (*) 4.22 - 5.81 MIL/uL   Hemoglobin 7.2 (*) 13.0 - 17.0 g/dL   HCT 21.7 (*) 39.0 - 52.0 %   MCV 94.3  78.0 - 100.0 fL   MCH 31.3  26.0 - 34.0 pg   MCHC 33.2  30.0 - 36.0 g/dL    RDW 16.2 (*) 11.5 - 15.5 %   Platelets 188  150 - 400 K/uL  COMPREHENSIVE METABOLIC PANEL     Status: Abnormal   Collection Time    02/22/14  5:00 AM      Result Value Ref Range   Sodium 137  137 - 147 mEq/L   Potassium 4.6  3.7 - 5.3 mEq/L   Chloride 91 (*) 96 - 112 mEq/L   CO2 22  19 - 32 mEq/L   Glucose, Bld 95  70 - 99 mg/dL   BUN 85 (*) 6 - 23 mg/dL   Creatinine, Ser 13.56 (*) 0.50 - 1.35 mg/dL   Calcium 9.8  8.4 - 10.5 mg/dL   Total Protein 7.1  6.0 - 8.3 g/dL   Albumin 3.4 (*) 3.5 - 5.2 g/dL   AST 20  0 - 37 U/L   ALT 60 (*) 0 - 53 U/L   Alkaline Phosphatase 171 (*) 39 - 117 U/L   Total Bilirubin 0.3  0.3 - 1.2 mg/dL   GFR calc non Af Amer 4 (*) >90 mL/min   GFR calc Af Amer 4 (*) >90 mL/min   Anion gap 24 (*) 5 - 15    Signed: Lucious Groves, DO 02/22/2014, 2:29 PM    Services Ordered on Discharge: None Equipment Ordered on Discharge: None

## 2014-02-22 NOTE — Progress Notes (Signed)
Subjective: Seen at HD. Patient feeling much better, no acute complaints.  Objective: Vital signs in last 24 hours: Filed Vitals:   02/21/14 0920 02/21/14 1749 02/21/14 2007 02/22/14 0528  BP: 129/88 135/97 121/73 130/90  Pulse: 94 135 96 89  Temp: 98.1 F (36.7 C) 97.9 F (36.6 C) 98.6 F (37 C) 98.4 F (36.9 C)  TempSrc: Oral Oral    Resp: 18 17 17 17   Height:      Weight:   167 lb (75.751 kg)   SpO2: 100% 100% 99% 100%   Weight change: 8 lb 0.8 oz (3.651 kg)  Intake/Output Summary (Last 24 hours) at 02/22/14 0755 Last data filed at 02/21/14 1846  Gross per 24 hour  Intake    360 ml  Output      1 ml  Net    359 ml    General: resting in bed  Cardiac: RRR, no rubs, murmurs or gallops Pulm: clear to auscultation bilaterally, moving normal volumes of air Abd: soft, nontender, nondistended, BS present Ext: no pedal edema Neuro: alert and oriented X3, cranial nerves II-XII grossly intact   Lab Results: Basic Metabolic Panel:  Recent Labs Lab 02/20/14 1305 02/21/14 0758  NA 139 137  K 3.1* 4.6  CL 93* 93*  CO2 29 24  GLUCOSE 136* 92  BUN 36* 60*  CREATININE 7.25* 10.42*  CALCIUM 9.5 10.1   Liver Function Tests:  Recent Labs Lab 02/20/14 1305  AST 33  ALT 64*  ALKPHOS 203*  BILITOT 0.2*  PROT 7.8  ALBUMIN 3.5    Recent Labs Lab 02/20/14 1305  LIPASE 17   CBC:  Recent Labs Lab 02/20/14 1305 02/21/14 0758  WBC 6.3 7.4  NEUTROABS 4.8  --   HGB 6.0* 7.9*  HCT 18.2* 24.0*  MCV 93.3 94.9  PLT 260 228   Coagulation:  Recent Labs Lab 02/20/14 1441  LABPROT 16.1*  INR 1.29   Anemia Panel:  Recent Labs Lab 02/20/14 1441 02/21/14 0758  VITAMINB12 786  --   FOLATE >20.0  --   FERRITIN 1290*  --   TIBC 223  --   IRON 96  --   RETICCTPCT 3.0 3.0   Urine Drug Screen: Drugs of Abuse     Component Value Date/Time   LABOPIA NEGATIVE 11/05/2011 1441   COCAINSCRNUR POSITIVE* 11/05/2011 1441   LABBENZ NEGATIVE 11/05/2011 1441   AMPHETMU NEGATIVE 11/05/2011 1441    Medications:  I have reviewed the patient's current medications. Prior to Admission:  Prescriptions prior to admission  Medication Sig Dispense Refill  . acetaminophen (TYLENOL) 500 MG tablet Take 1,000 mg by mouth every 6 (six) hours as needed for mild pain.       Marland Kitchen amLODipine (NORVASC) 10 MG tablet Take 1 tablet (10 mg total) by mouth daily.  30 tablet  1  . aspirin 81 MG chewable tablet Chew 1 tablet (81 mg total) by mouth daily.  30 tablet  11  . carvedilol (COREG) 25 MG tablet Take 1.5 tablets (37.5 mg total) by mouth 2 (two) times daily with a meal. On Monday-Wednesday-Friday-Sunday (non-HD days); on dialysis days (Tuesday-Thursday-Saturday) take it once a day (evening time only).  90 tablet  2  . cinacalcet (SENSIPAR) 30 MG tablet Take 30 mg by mouth daily.      . cloNIDine (CATAPRES) 0.2 MG tablet Take 1 tablet (0.2 mg total) by mouth 2 (two) times daily.  60 tablet  2  . diphenhydrAMINE (BENADRYL) 25 MG  tablet Take 25 mg by mouth every 6 (six) hours as needed for itching.      . famotidine (PEPCID) 20 MG tablet Take 1 tablet (20 mg total) by mouth daily.  30 tablet  1  . losartan (COZAAR) 100 MG tablet Take 100 mg by mouth at bedtime.      . multivitamin (RENA-VIT) TABS tablet Take 1 tablet by mouth at bedtime.      . Nutritional Supplements (FEEDING SUPPLEMENT, NEPRO CARB STEADY,) LIQD Take 237 mLs by mouth 3 (three) times daily as needed (Supplement).    0  . pantoprazole (PROTONIX) 40 MG tablet Take 1 tablet (40 mg total) by mouth daily.  30 tablet  1  . sevelamer carbonate (RENVELA) 800 MG tablet Take 2 tablets (1,600 mg total) by mouth 3 (three) times daily with meals.  90 tablet  0  . topiramate (TOPAMAX) 25 MG tablet Take 1 tablet (25 mg total) by mouth at bedtime.  60 tablet  6   Scheduled Meds: . aspirin  81 mg Oral Daily  . carvedilol  37.5 mg Oral Q supper  . carvedilol  37.5 mg Oral Once per day on Sun Mon Wed Fri  . cinacalcet  30 mg  Oral Q supper  . cloNIDine  0.2 mg Oral BID  . [START ON 02/27/2014] darbepoetin (ARANESP) injection - DIALYSIS  200 mcg Intravenous Q Thu-HD  . doxercalciferol  4 mcg Intravenous Q T,Th,Sa-HD  . ferric gluconate (FERRLECIT/NULECIT) IV  62.5 mg Intravenous Q T,Th,Sa-HD  . gi cocktail  30 mL Oral Once  . multivitamin  1 tablet Oral QHS  . pantoprazole  40 mg Oral Daily  . sevelamer carbonate  1,600 mg Oral TID WC  . sodium chloride  3 mL Intravenous Q12H  . topiramate  25 mg Oral QHS   Continuous Infusions:  PRN Meds:.sodium chloride, acetaminophen, feeding supplement (NEPRO CARB STEADY), sodium chloride Assessment/Plan: Principal Problem:   Symptomatic anemia Active Problems:   Tobacco abuse   HTN (hypertension)   Seizure disorder   ESRD on hemodialysis   PAF (paroxysmal atrial fibrillation)   Chronic diastolic congestive heart failure   COPD (chronic obstructive pulmonary disease)  Symptomatic Anemia: CBC in AM pending. Recent labs show haptoglobin elevated at 246, ferritin elevated at 1290. The following were normal: LDH, vitamin B12, folate, iron, TIBC, saturation ratio. Reticulocyte count elevated at 3.0. Patient receiving Aranesp injections per renal. - Dr. Beryle Beams discussed BM biopsy with patient to further evaluate it however both agreed on a plan to monitor for a few weeks and plan to do so if not improving with Epo injections with dialysis. -  Will discharge home today after HD.  Esophagitis versus Esophageal Stricture: Patient complains of dysphagia, food getting stuck, bloating, epigastric discomfort. Patient previously diagnosed with esophagitis on EGD in January 2015. Patient is on protonix 40 mg daily and famotidine 20 mg daily at home.  -Modified Barium Swallow: no high grade stricture -Continue Protoxin 40 mg daily -Follow up as outpatient with GI  Elevated Liver Enzymes: Alk Phos and ALT elevated. AST and T. Bili normal. Previously normal in September 2015.  Patient was HIV negative one month ago. Hep A, B, and C negative in 2013. Patient denies alcohol use or IV drug use. Hepatitis serorology again negative. U/S shows thickened gallbladder with echogenic foci c/w adenomyomatosis.  It also shows hepatomegaly with concern for hepatic steatosis. -Follow up as outpatient  ESRD on Dialysis TTS: -Nephrology following, dialysis today -Renal Diet  -  Sensipar  -Renvela  -Topamax for dialysis induced headaches   HTN: Patient is currently normotensive.  -Carvedilol 37.5 mg BID, except QD on TTS  -Clonidine 0.2 mg BID  - Resume Coozar and Amlodipine on discharge  PAF: Patient is currently in sinus rhythm.  -ASA 81 mg daily  -Carvedilol 37.5 mg BID, except QD on TTS   DVT/PE ppx: SCDs  Dispo: Discharge home today  The patient does have a current PCP Leamon Arnt, MD) and does not need an [facility] hospital follow-up appointment after discharge.  The patient does not have transportation limitations that hinder transportation to clinic appointments.  .Services Needed at time of discharge: Y = Yes, Blank = No PT:   OT:   RN:   Equipment:   Other:     LOS: 2 days   Lucious Groves, DO  PGY-2 Internal Medicine Resident Pager # [phone]  02/22/2014 7:55 AM

## 2014-02-22 NOTE — Discharge Instructions (Signed)
Please take you medications as prescribed.  Follow up with your PCP for recheck of your blood counts.  You may also need to be referred back to your Gastroenterologist for evaluation of your esophageal stricture.

## 2014-02-22 NOTE — Progress Notes (Signed)
  [facility] Progress Note   Subjective:   Filed Vitals:   02/22/14 0822 02/22/14 0830 02/22/14 0900 02/22/14 0930  BP: 132/84 134/90 138/84 133/73  Pulse: 78 82 75 83  Temp:      TempSrc:      Resp:      Height:      Weight:      SpO2:       Exam: WM NAD cooperative, calm No JVD Chest clear bilat RRR no MRG Abd soft, NTND No LE edema Left arm AVF Neuro is nf, Ox 3  Dialysis: TTS [place] 4h 6min  71.5kg   1K/2.5 [place]  Heparin none   LUA AVF   450/A1.5 Hectorol 4 mcg TIW,  Aranesp 200 mcg q Thurs, Venofer 100 mg q HD thru 03/08/14 then 50 mg weekly Hgb 7.7, tsat 14%   Assessment:  1. Symptomatic Anemia - Hb 6 on admit, FOBT neg, s/p prbc x 2, had Ba swallow for dysphagia; getting aranesp and IV Fe load 2. ESRD - on HD 3. Hypertension/volume - 3kg up, for HD today. Getting clon / coreg, holding losartan/norvasc appropriately, BP is normal which is unusual for him 4. Metabolic bone disease - Binder, Sensipar, IV Vit d on hd 5. Hx Drug abuse ( Cocaine use)''last used a few weeks ago"- this is remarkable as he is a daily user for over 20 years 6. Dialysis-assoc headaches- have not been as bad lately according to patient  Plan - HD today, UF to dry wt.    Kelly Splinter MD  pager [phone]    cell [phone]  02/22/2014, 9:55 AM     Recent Labs Lab 02/20/14 1305 02/21/14 0758 02/22/14 0500  NA 139 137 137  K 3.1* 4.6 4.6  CL 93* 93* 91*  CO2 29 24 22   GLUCOSE 136* 92 95  BUN 36* 60* 85*  CREATININE 7.25* 10.42* 13.56*  CALCIUM 9.5 10.1 9.8    Recent Labs Lab 02/20/14 1305 02/22/14 0500  AST 33 20  ALT 64* 60*  ALKPHOS 203* 171*  BILITOT 0.2* 0.3  PROT 7.8 7.1  ALBUMIN 3.5 3.4*    Recent Labs Lab 02/20/14 1305 02/21/14 0758 02/22/14 0500  WBC 6.3 7.4 6.3  NEUTROABS 4.8  --   --   HGB 6.0* 7.9* 7.2*  HCT 18.2* 24.0* 21.7*  MCV 93.3 94.9 94.3  PLT 260 228 188   . aspirin  81 mg Oral Daily  . carvedilol  37.5 mg Oral Q  supper  . carvedilol  37.5 mg Oral Once per day on Sun Mon Wed Fri  . cinacalcet  30 mg Oral Q supper  . cloNIDine  0.2 mg Oral BID  . [START ON 02/27/2014] darbepoetin (ARANESP) injection - DIALYSIS  200 mcg Intravenous Q Thu-HD  . doxercalciferol  4 mcg Intravenous Q T,Th,Sa-HD  . ferric gluconate (FERRLECIT/NULECIT) IV  62.5 mg Intravenous Q T,Th,Sa-HD  . gi cocktail  30 mL Oral Once  . multivitamin  1 tablet Oral QHS  . pantoprazole  40 mg Oral Daily  . sevelamer carbonate  1,600 mg Oral TID WC  . sodium chloride  3 mL Intravenous Q12H  . topiramate  25 mg Oral QHS     sodium chloride, acetaminophen, feeding supplement (NEPRO CARB STEADY), sodium chloride

## 2014-03-02 NOTE — Discharge Summary (Signed)
Medicine Attending: I personally examined this patient on tne day of discharge and concur with the discharge summary and plan as recorded by resident physician Dr Joni Reining. See separate attending summary note. Murriel Hopper, MD, [place]  Hematology-Oncology/Internal Medicine

## 2014-03-15 ENCOUNTER — Other Ambulatory Visit (HOSPITAL_COMMUNITY): Payer: Self-pay | Admitting: Internal Medicine

## 2014-03-24 ENCOUNTER — Other Ambulatory Visit: Payer: Self-pay | Admitting: Internal Medicine

## 2014-03-26 ENCOUNTER — Other Ambulatory Visit: Payer: Self-pay | Admitting: Internal Medicine

## 2014-04-02 ENCOUNTER — Other Ambulatory Visit: Payer: Self-pay | Admitting: Internal Medicine

## 2014-04-14 ENCOUNTER — Other Ambulatory Visit: Payer: Self-pay | Admitting: Internal Medicine

## 2014-04-14 ENCOUNTER — Ambulatory Visit: Payer: Medicare Other | Admitting: Cardiovascular Disease

## 2014-05-22 ENCOUNTER — Encounter: Payer: Self-pay | Admitting: Cardiology

## 2014-05-29 ENCOUNTER — Encounter (HOSPITAL_COMMUNITY): Payer: Self-pay | Admitting: Gastroenterology

## 2014-06-02 ENCOUNTER — Ambulatory Visit: Payer: Medicare Other | Admitting: Neurology

## 2014-06-04 ENCOUNTER — Ambulatory Visit: Payer: Medicare Other | Admitting: Neurology

## 2014-06-27 DIAGNOSIS — L299 Pruritus, unspecified: Secondary | ICD-10-CM | POA: Insufficient documentation

## 2014-08-01 DIAGNOSIS — Z992 Dependence on renal dialysis: Secondary | ICD-10-CM | POA: Insufficient documentation

## 2014-08-01 DIAGNOSIS — Z4901 Encounter for fitting and adjustment of extracorporeal dialysis catheter: Secondary | ICD-10-CM | POA: Insufficient documentation

## 2015-03-27 ENCOUNTER — Other Ambulatory Visit: Payer: Self-pay | Admitting: Neurology

## 2015-03-27 NOTE — Telephone Encounter (Signed)
Hi Jeremy Mccoy,  I spoke to his mother (on HIPPA) - an appt has been scheduled for him on 03/30/15. She is unsure how long he has been off medication but feels it has been for some time now. I told her we would hold off on refills until Monday when he can be re-evaluated.  Thanks,  Sharyn Lull

## 2015-03-30 ENCOUNTER — Telehealth: Payer: Self-pay | Admitting: *Deleted

## 2015-03-30 ENCOUNTER — Telehealth: Payer: Self-pay | Admitting: Neurology

## 2015-03-30 ENCOUNTER — Ambulatory Visit: Payer: Medicare Other | Admitting: Neurology

## 2015-03-30 MED ORDER — LEVETIRACETAM 1000 MG PO TABS: 1000.0000 mg | ORAL_TABLET | Freq: Two times a day (BID) | ORAL | Status: DC

## 2015-03-30 NOTE — Telephone Encounter (Signed)
Patient left the clinic without been seen,  I have reviewed previous office note by Dr. Janann Colonel, September 2015  He had a history of polysubstance abuse, including cocaine, was admitted to hospital July 10 2013 for status epilepticus,  MRI of the brain consistent with PRES  Patient has not been compliance with his medications, chart only reviewed topiramate 25 mg every night,  I have called in Keppra 1000 mg twice a day for patient, one month supply, schedule patient within one or 2 days

## 2015-03-30 NOTE — Telephone Encounter (Signed)
Spoke to [employer] (mother on HIPPA) - she is aware that a prescription for Keppra has been sent to the pharmacy.  She is going to speak to [employer] and call me back tomorrow to reschedule his appt.

## 2015-03-30 NOTE — Telephone Encounter (Addendum)
Patient had a 4pm appointment with Dr. Krista Blue.  Called him back at 4:13 for his appt and he decided not to stay and left our office.  Attempted to reach patient by phone - his mother (on [employer]) answered his phone - said she would talk with him and call back to schedule another time.  Said she would speak with him about the importance of staying for his appointment.

## 2015-03-31 NOTE — Telephone Encounter (Signed)
Spoke to Krag - he will be here on 04/01/15 for an appt.

## 2015-03-31 NOTE — Telephone Encounter (Signed)
Left message for a return call - need to schedule his appt.

## 2015-04-01 ENCOUNTER — Ambulatory Visit (INDEPENDENT_AMBULATORY_CARE_PROVIDER_SITE_OTHER): Payer: Medicare Other | Admitting: Neurology

## 2015-04-01 ENCOUNTER — Encounter: Payer: Self-pay | Admitting: Neurology

## 2015-04-01 VITALS — BP 144/96 | HR 71 | Ht 72.0 in | Wt 159.0 lb

## 2015-04-01 DIAGNOSIS — N186 End stage renal disease: Secondary | ICD-10-CM | POA: Diagnosis not present

## 2015-04-01 DIAGNOSIS — G40909 Epilepsy, unspecified, not intractable, without status epilepticus: Secondary | ICD-10-CM | POA: Diagnosis not present

## 2015-04-01 MED ORDER — LEVETIRACETAM 1000 MG PO TABS: 1000.0000 mg | ORAL_TABLET | Freq: Two times a day (BID) | ORAL | Status: DC

## 2015-04-01 NOTE — Progress Notes (Signed)
PATIENT: Jeremy Mccoy DOB: [birth year]  Chief Complaint  Patient presents with  . Seizures    He started Keppra 1000mg  BID on 03/31/15.  He denies any recent seizure activity.     HISTORICAL  NAME Jeremy Mccoy is a 49 years old right-handed male, returned for evaluation of seizure, last visit was with Dr. Janann Colonel in September 2015  He had a history of status epilepticus in July 10 2013, he was found down by his family, MRI of the brain consistent with diagnosis of PRES, he was intubated, was treated with IV Keppra, Dilantin, along with IV propofol and phenobarbital, he was discharged home with Keppra 500 mg twice a day, Dilantin 600 mg daily,  He also had a history of end-stage renal disease, receiving dialysis since 2013, his seizure was thought due to his noncompliance with hemodialysis, he had a history of cocaine abuse, mood disorder,  Since last visit he has lost follow-up, came in 2 days ago, the left the clinic without being seen, he was only taking Topamax 25 mg every day, I have called in [place] 1000 mg twice a day in March 31 2015, he has take few doses, and makes him a little drowsy, he likes to side effect  He lives at home with his parents, he drove to clinic today, he denies recurrent seizure.  REVIEW OF SYSTEMS: Full 14 system review of systems performed and notable only for fatigue, trouble swallowing, double vision, cough, blurry vision, frequent awakening, joint pain, joint swelling, back pain, achy muscles, walking difficulty, neck pain, rash, weakness  ALLERGIES: No Known Allergies  HOME MEDICATIONS: Current Outpatient Prescriptions  Medication Sig Dispense Refill  . acetaminophen (TYLENOL) 500 MG tablet Take 1,000 mg by mouth every 6 (six) hours as needed for mild pain.     Marland Kitchen amLODipine (NORVASC) 10 MG tablet Take 1 tablet (10 mg total) by mouth daily. 30 tablet 1  . aspirin 81 MG chewable tablet Chew 1 tablet (81 mg total) by mouth daily. 30 tablet 11   . carvedilol (COREG) 25 MG tablet Take 1.5 tablets (37.5 mg total) by mouth 2 (two) times daily with a meal. On Monday-Wednesday-Friday-Sunday (non-HD days); on dialysis days (Tuesday-Thursday-Saturday) take it once a day (evening time only). 90 tablet 2  . cinacalcet (SENSIPAR) 30 MG tablet Take 30 mg by mouth daily.    . cloNIDine (CATAPRES) 0.2 MG tablet Take 1 tablet (0.2 mg total) by mouth 2 (two) times daily. 60 tablet 2  . diphenhydrAMINE (BENADRYL) 25 MG tablet Take 25 mg by mouth every 6 (six) hours as needed for itching.    . levETIRAcetam (KEPPRA) 1000 MG tablet Take 1 tablet (1,000 mg total) by mouth 2 (two) times daily. 60 tablet 4  . losartan (COZAAR) 100 MG tablet Take 100 mg by mouth at bedtime.    . multivitamin (RENA-VIT) TABS tablet Take 1 tablet by mouth at bedtime.    . Nutritional Supplements (FEEDING SUPPLEMENT, NEPRO CARB STEADY,) LIQD Take 237 mLs by mouth 3 (three) times daily as needed (Supplement).  0  . pantoprazole (PROTONIX) 40 MG tablet Take 1 tablet (40 mg total) by mouth daily. 30 tablet 1  . sevelamer carbonate (RENVELA) 800 MG tablet Take 2 tablets (1,600 mg total) by mouth 3 (three) times daily with meals. 90 tablet 0   No current facility-administered medications for this visit.    PAST MEDICAL HISTORY: Past Medical History  Diagnosis Date  . COPD (chronic obstructive pulmonary disease) ([place])   .  Crack cocaine use   . Dental caries   . HTN (hypertension) 06/12/2012  . Hematemesis/vomiting blood   . Shortness of breath     "just when I have too much potassium is too high" (05/15/2013)  . Anemia   . History of blood transfusion 10/2011; 04/2013  . Headache(784.0)     "get it from going to dialysis; when they get real bad I come to hospital" (05/15/2013)  . Arthritis     "qwhere" (05/15/2013)  . End stage renal disease ([place]) 01/11/2012    Patient presented 11/04/11 to hospital with CP, wt loss and N/V. Creat was 10, admitted. 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 ANA was + 1:80, +MPO and +pr3 Ab's (ANCA). Received plasmapheresis x 7, IV cytoxan and pred taper. First HD was 11/17/11. 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 Pt d/c'd to do outpt HD and may have gotten 1-2 additional Cytoxan as OP, but this was stopped eventually since renal function didn't recover. Now gets HD TTS schedule at [facility].  L forearm AVF 11/19/11 by Dr. Scot Dock is current access.    Marland Kitchen ESRD (end stage renal disease) on dialysis [facility])     TTS: [address] [place] (05/15/2013)  . ESRD (end stage renal disease) on dialysis ([place])   . Headache(784.0)   . Depression   . Anxiety     PAST SURGICAL HISTORY: Past Surgical History  Procedure Laterality Date  . Av fistula placement  11/29/2011    Procedure: ARTERIOVENOUS (AV) FISTULA CREATION;  Surgeon: Angelia Mould, MD;  Location: [facility];  Service: Vascular;  Laterality: Left;  . Renal biopsy  11/07/2011  . Inguinal hernia repair Bilateral 1967  . Esophagogastroduodenoscopy N/A 05/17/2013    Procedure: ESOPHAGOGASTRODUODENOSCOPY (EGD);  Surgeon: Beryle Beams, MD;  Location: [facility] ENDOSCOPY;  Service: Endoscopy;  Laterality: N/A;    FAMILY HISTORY: Family History  Problem Relation Age of Onset  . Heart attack Father     SOCIAL HISTORY:  Social History   Social History  . Marital Status: Single    Spouse Name: N/A  . Number of Children: 0  . Years of Education: 9 th   Occupational History  . floor insulation     Disabled   Social History Main Topics  . Smoking status: Current Every Day Smoker -- 1.00 packs/day for 35 years    Types: Cigars, Cigarettes  . Smokeless tobacco: Never Used  . Alcohol Use: No     Comment: 05/15/2013 "aien't drank in ~ 25 yrs"  . Drug Use: Yes    Special: Marijuana, Cocaine     Comment: Precription drugs (e.g. percocet) "I take what I  have to to controll my constant pain" (05/15/2013)  . Sexual Activity: No   Other Topics Concern  . Not on file   Social History Narrative   ** Merged History Encounter **   Patient lives at home with his parents and he is disabled.   Education 9th grade education   Right handed   Caffeine one cup daily         PHYSICAL EXAM   Filed Vitals:   04/01/15 0729  BP: 144/96  Pulse: 71  Height: 6' (1.829 m)  Weight: 159 lb (72.122 kg)    Not recorded      Body mass index is 21.56 kg/(m^2).  PHYSICAL EXAMNIATION:  Gen: NAD,  conversant, well nourised, obese, well groomed                     Cardiovascular: Regular rate rhythm, no peripheral edema, warm, nontender. Eyes: Conjunctivae clear without exudates or hemorrhage Neck: Supple, no carotid bruise. Pulmonary: Clear to auscultation bilaterally   NEUROLOGICAL EXAM:  MENTAL STATUS: Speech:    Speech is normal; fluent and spontaneous with normal comprehension.  Cognition:     Orientation to time, place and person     Normal recent and remote memory     Normal Attention span and concentration     Normal Language, naming, repeating,spontaneous speech     Fund of knowledge   CRANIAL NERVES: CN II: Visual fields are full to confrontation. Fundoscopic exam is normal with sharp discs and no vascular changes. Pupils are round equal and briskly reactive to light. CN III, IV, VI: extraocular movement are normal. No ptosis. CN V: Facial sensation is intact to pinprick in all 3 divisions bilaterally. Corneal responses are intact.  CN VII: Face is symmetric with normal eye closure and smile. CN VIII: Hearing is normal to rubbing fingers CN IX, X: Palate elevates symmetrically. Phonation is normal. CN XI: Head turning and shoulder shrug are intact CN XII: Tongue is midline with normal movements and no atrophy.  MOTOR: There is no pronator drift of out-stretched arms. Muscle bulk and tone are normal. Muscle strength is  normal.  REFLEXES: Reflexes are 2+ and symmetric at the biceps, triceps, knees, and ankles. Plantar responses are flexor.  SENSORY: Intact to light touch, pinprick, position sense, and vibration sense are intact in fingers and toes.  COORDINATION: Rapid alternating movements and fine finger movements are intact. There is no dysmetria on finger-to-nose and heel-knee-shin.    GAIT/STANCE: Posture is normal. Gait is steady with normal steps, base, arm swing, and turning. Heel and toe walking are normal. Tandem gait is normal.  Romberg is absent.   DIAGNOSTIC DATA (LABS, IMAGING, TESTING) - I reviewed patient records, labs, notes, testing and imaging myself where available.   ASSESSMENT AND PLAN  Jeremy Mccoy is a 49 y.o. male   History of cocaine abuse End-stage renal disease on hemodialysis, noncompliance with his hemodialysis schedule History of status epilepticus in February 2015  Restart Keppra 1000 mg twice a day  If he continue followup, compliance with his medications, may consider decrease Keppra to 500 mg twice a day     Marcial Pacas, M.D. Ph.D.  [facility] [address] [place], [place] [address] Ph: [phone] Fax: [phone]  CC: Referring Provider

## 2015-04-27 ENCOUNTER — Ambulatory Visit: Payer: Self-pay | Admitting: Neurology

## 2015-05-19 DIAGNOSIS — N2581 Secondary hyperparathyroidism of renal origin: Secondary | ICD-10-CM | POA: Diagnosis not present

## 2015-05-19 DIAGNOSIS — D509 Iron deficiency anemia, unspecified: Secondary | ICD-10-CM | POA: Diagnosis not present

## 2015-05-19 DIAGNOSIS — E875 Hyperkalemia: Secondary | ICD-10-CM | POA: Diagnosis not present

## 2015-05-19 DIAGNOSIS — D631 Anemia in chronic kidney disease: Secondary | ICD-10-CM | POA: Diagnosis not present

## 2015-05-19 DIAGNOSIS — N186 End stage renal disease: Secondary | ICD-10-CM | POA: Diagnosis not present

## 2015-05-23 DIAGNOSIS — N2581 Secondary hyperparathyroidism of renal origin: Secondary | ICD-10-CM | POA: Diagnosis not present

## 2015-05-23 DIAGNOSIS — D631 Anemia in chronic kidney disease: Secondary | ICD-10-CM | POA: Diagnosis not present

## 2015-05-23 DIAGNOSIS — D509 Iron deficiency anemia, unspecified: Secondary | ICD-10-CM | POA: Diagnosis not present

## 2015-05-23 DIAGNOSIS — E875 Hyperkalemia: Secondary | ICD-10-CM | POA: Diagnosis not present

## 2015-05-23 DIAGNOSIS — N186 End stage renal disease: Secondary | ICD-10-CM | POA: Diagnosis not present

## 2015-05-27 DIAGNOSIS — D509 Iron deficiency anemia, unspecified: Secondary | ICD-10-CM | POA: Diagnosis not present

## 2015-05-27 DIAGNOSIS — D631 Anemia in chronic kidney disease: Secondary | ICD-10-CM | POA: Diagnosis not present

## 2015-05-27 DIAGNOSIS — E875 Hyperkalemia: Secondary | ICD-10-CM | POA: Diagnosis not present

## 2015-05-27 DIAGNOSIS — N186 End stage renal disease: Secondary | ICD-10-CM | POA: Diagnosis not present

## 2015-05-27 DIAGNOSIS — N2581 Secondary hyperparathyroidism of renal origin: Secondary | ICD-10-CM | POA: Diagnosis not present

## 2015-05-28 DIAGNOSIS — E875 Hyperkalemia: Secondary | ICD-10-CM | POA: Diagnosis not present

## 2015-05-28 DIAGNOSIS — D509 Iron deficiency anemia, unspecified: Secondary | ICD-10-CM | POA: Diagnosis not present

## 2015-05-28 DIAGNOSIS — D631 Anemia in chronic kidney disease: Secondary | ICD-10-CM | POA: Diagnosis not present

## 2015-05-28 DIAGNOSIS — N2581 Secondary hyperparathyroidism of renal origin: Secondary | ICD-10-CM | POA: Diagnosis not present

## 2015-05-28 DIAGNOSIS — N186 End stage renal disease: Secondary | ICD-10-CM | POA: Diagnosis not present

## 2015-06-01 ENCOUNTER — Ambulatory Visit: Payer: Medicare Other | Admitting: Neurology

## 2015-06-01 ENCOUNTER — Telehealth: Payer: Self-pay

## 2015-06-01 NOTE — Telephone Encounter (Signed)
Pt did not show for his appt with Dr. Krista Blue today

## 2015-06-02 ENCOUNTER — Encounter: Payer: Self-pay | Admitting: Neurology

## 2015-06-03 DIAGNOSIS — N186 End stage renal disease: Secondary | ICD-10-CM | POA: Diagnosis not present

## 2015-06-03 DIAGNOSIS — D631 Anemia in chronic kidney disease: Secondary | ICD-10-CM | POA: Diagnosis not present

## 2015-06-03 DIAGNOSIS — N2581 Secondary hyperparathyroidism of renal origin: Secondary | ICD-10-CM | POA: Diagnosis not present

## 2015-06-03 DIAGNOSIS — D509 Iron deficiency anemia, unspecified: Secondary | ICD-10-CM | POA: Diagnosis not present

## 2015-06-03 DIAGNOSIS — E875 Hyperkalemia: Secondary | ICD-10-CM | POA: Diagnosis not present

## 2015-06-04 DIAGNOSIS — D631 Anemia in chronic kidney disease: Secondary | ICD-10-CM | POA: Diagnosis not present

## 2015-06-04 DIAGNOSIS — N186 End stage renal disease: Secondary | ICD-10-CM | POA: Diagnosis not present

## 2015-06-04 DIAGNOSIS — D509 Iron deficiency anemia, unspecified: Secondary | ICD-10-CM | POA: Diagnosis not present

## 2015-06-04 DIAGNOSIS — E875 Hyperkalemia: Secondary | ICD-10-CM | POA: Diagnosis not present

## 2015-06-04 DIAGNOSIS — N2581 Secondary hyperparathyroidism of renal origin: Secondary | ICD-10-CM | POA: Diagnosis not present

## 2015-06-06 DIAGNOSIS — N2581 Secondary hyperparathyroidism of renal origin: Secondary | ICD-10-CM | POA: Diagnosis not present

## 2015-06-06 DIAGNOSIS — D509 Iron deficiency anemia, unspecified: Secondary | ICD-10-CM | POA: Diagnosis not present

## 2015-06-06 DIAGNOSIS — N186 End stage renal disease: Secondary | ICD-10-CM | POA: Diagnosis not present

## 2015-06-06 DIAGNOSIS — D631 Anemia in chronic kidney disease: Secondary | ICD-10-CM | POA: Diagnosis not present

## 2015-06-06 DIAGNOSIS — E875 Hyperkalemia: Secondary | ICD-10-CM | POA: Diagnosis not present

## 2015-06-09 DIAGNOSIS — N2581 Secondary hyperparathyroidism of renal origin: Secondary | ICD-10-CM | POA: Diagnosis not present

## 2015-06-09 DIAGNOSIS — E875 Hyperkalemia: Secondary | ICD-10-CM | POA: Diagnosis not present

## 2015-06-09 DIAGNOSIS — N186 End stage renal disease: Secondary | ICD-10-CM | POA: Diagnosis not present

## 2015-06-09 DIAGNOSIS — D631 Anemia in chronic kidney disease: Secondary | ICD-10-CM | POA: Diagnosis not present

## 2015-06-09 DIAGNOSIS — D509 Iron deficiency anemia, unspecified: Secondary | ICD-10-CM | POA: Diagnosis not present

## 2015-06-11 DIAGNOSIS — D509 Iron deficiency anemia, unspecified: Secondary | ICD-10-CM | POA: Diagnosis not present

## 2015-06-11 DIAGNOSIS — D631 Anemia in chronic kidney disease: Secondary | ICD-10-CM | POA: Diagnosis not present

## 2015-06-11 DIAGNOSIS — N2581 Secondary hyperparathyroidism of renal origin: Secondary | ICD-10-CM | POA: Diagnosis not present

## 2015-06-11 DIAGNOSIS — N186 End stage renal disease: Secondary | ICD-10-CM | POA: Diagnosis not present

## 2015-06-11 DIAGNOSIS — E875 Hyperkalemia: Secondary | ICD-10-CM | POA: Diagnosis not present

## 2015-06-16 DIAGNOSIS — D509 Iron deficiency anemia, unspecified: Secondary | ICD-10-CM | POA: Diagnosis not present

## 2015-06-16 DIAGNOSIS — Z992 Dependence on renal dialysis: Secondary | ICD-10-CM | POA: Diagnosis not present

## 2015-06-16 DIAGNOSIS — N2581 Secondary hyperparathyroidism of renal origin: Secondary | ICD-10-CM | POA: Diagnosis not present

## 2015-06-16 DIAGNOSIS — E875 Hyperkalemia: Secondary | ICD-10-CM | POA: Diagnosis not present

## 2015-06-16 DIAGNOSIS — D631 Anemia in chronic kidney disease: Secondary | ICD-10-CM | POA: Diagnosis not present

## 2015-06-16 DIAGNOSIS — N186 End stage renal disease: Secondary | ICD-10-CM | POA: Diagnosis not present

## 2015-06-16 DIAGNOSIS — I10 Essential (primary) hypertension: Secondary | ICD-10-CM | POA: Diagnosis not present

## 2015-06-19 DIAGNOSIS — N2581 Secondary hyperparathyroidism of renal origin: Secondary | ICD-10-CM | POA: Diagnosis not present

## 2015-06-19 DIAGNOSIS — N186 End stage renal disease: Secondary | ICD-10-CM | POA: Diagnosis not present

## 2015-06-19 DIAGNOSIS — D631 Anemia in chronic kidney disease: Secondary | ICD-10-CM | POA: Diagnosis not present

## 2015-06-19 DIAGNOSIS — D509 Iron deficiency anemia, unspecified: Secondary | ICD-10-CM | POA: Diagnosis not present

## 2015-06-22 DIAGNOSIS — D509 Iron deficiency anemia, unspecified: Secondary | ICD-10-CM | POA: Diagnosis not present

## 2015-06-22 DIAGNOSIS — N2581 Secondary hyperparathyroidism of renal origin: Secondary | ICD-10-CM | POA: Diagnosis not present

## 2015-06-22 DIAGNOSIS — N186 End stage renal disease: Secondary | ICD-10-CM | POA: Diagnosis not present

## 2015-06-22 DIAGNOSIS — D631 Anemia in chronic kidney disease: Secondary | ICD-10-CM | POA: Diagnosis not present

## 2015-06-24 DIAGNOSIS — D509 Iron deficiency anemia, unspecified: Secondary | ICD-10-CM | POA: Diagnosis not present

## 2015-06-24 DIAGNOSIS — D631 Anemia in chronic kidney disease: Secondary | ICD-10-CM | POA: Diagnosis not present

## 2015-06-24 DIAGNOSIS — N2581 Secondary hyperparathyroidism of renal origin: Secondary | ICD-10-CM | POA: Diagnosis not present

## 2015-06-24 DIAGNOSIS — N186 End stage renal disease: Secondary | ICD-10-CM | POA: Diagnosis not present

## 2015-06-26 DIAGNOSIS — D509 Iron deficiency anemia, unspecified: Secondary | ICD-10-CM | POA: Diagnosis not present

## 2015-06-26 DIAGNOSIS — N186 End stage renal disease: Secondary | ICD-10-CM | POA: Diagnosis not present

## 2015-06-26 DIAGNOSIS — D631 Anemia in chronic kidney disease: Secondary | ICD-10-CM | POA: Diagnosis not present

## 2015-06-26 DIAGNOSIS — N2581 Secondary hyperparathyroidism of renal origin: Secondary | ICD-10-CM | POA: Diagnosis not present

## 2015-06-29 DIAGNOSIS — N186 End stage renal disease: Secondary | ICD-10-CM | POA: Diagnosis not present

## 2015-06-29 DIAGNOSIS — N2581 Secondary hyperparathyroidism of renal origin: Secondary | ICD-10-CM | POA: Diagnosis not present

## 2015-06-29 DIAGNOSIS — D509 Iron deficiency anemia, unspecified: Secondary | ICD-10-CM | POA: Diagnosis not present

## 2015-06-29 DIAGNOSIS — D631 Anemia in chronic kidney disease: Secondary | ICD-10-CM | POA: Diagnosis not present

## 2015-07-01 DIAGNOSIS — D631 Anemia in chronic kidney disease: Secondary | ICD-10-CM | POA: Diagnosis not present

## 2015-07-01 DIAGNOSIS — D509 Iron deficiency anemia, unspecified: Secondary | ICD-10-CM | POA: Diagnosis not present

## 2015-07-01 DIAGNOSIS — N2581 Secondary hyperparathyroidism of renal origin: Secondary | ICD-10-CM | POA: Diagnosis not present

## 2015-07-01 DIAGNOSIS — N186 End stage renal disease: Secondary | ICD-10-CM | POA: Diagnosis not present

## 2015-07-03 DIAGNOSIS — D509 Iron deficiency anemia, unspecified: Secondary | ICD-10-CM | POA: Diagnosis not present

## 2015-07-03 DIAGNOSIS — D631 Anemia in chronic kidney disease: Secondary | ICD-10-CM | POA: Diagnosis not present

## 2015-07-03 DIAGNOSIS — N2581 Secondary hyperparathyroidism of renal origin: Secondary | ICD-10-CM | POA: Diagnosis not present

## 2015-07-03 DIAGNOSIS — N186 End stage renal disease: Secondary | ICD-10-CM | POA: Diagnosis not present

## 2015-07-06 DIAGNOSIS — D631 Anemia in chronic kidney disease: Secondary | ICD-10-CM | POA: Diagnosis not present

## 2015-07-06 DIAGNOSIS — N186 End stage renal disease: Secondary | ICD-10-CM | POA: Diagnosis not present

## 2015-07-06 DIAGNOSIS — D509 Iron deficiency anemia, unspecified: Secondary | ICD-10-CM | POA: Diagnosis not present

## 2015-07-06 DIAGNOSIS — N2581 Secondary hyperparathyroidism of renal origin: Secondary | ICD-10-CM | POA: Diagnosis not present

## 2015-07-09 DIAGNOSIS — I871 Compression of vein: Secondary | ICD-10-CM | POA: Diagnosis not present

## 2015-07-09 DIAGNOSIS — N186 End stage renal disease: Secondary | ICD-10-CM | POA: Diagnosis not present

## 2015-07-09 DIAGNOSIS — T82858A Stenosis of vascular prosthetic devices, implants and grafts, initial encounter: Secondary | ICD-10-CM | POA: Diagnosis not present

## 2015-07-09 DIAGNOSIS — Z4802 Encounter for removal of sutures: Secondary | ICD-10-CM | POA: Insufficient documentation

## 2015-07-09 DIAGNOSIS — Z992 Dependence on renal dialysis: Secondary | ICD-10-CM | POA: Diagnosis not present

## 2015-07-10 DIAGNOSIS — N186 End stage renal disease: Secondary | ICD-10-CM | POA: Diagnosis not present

## 2015-07-10 DIAGNOSIS — D509 Iron deficiency anemia, unspecified: Secondary | ICD-10-CM | POA: Diagnosis not present

## 2015-07-10 DIAGNOSIS — D631 Anemia in chronic kidney disease: Secondary | ICD-10-CM | POA: Diagnosis not present

## 2015-07-10 DIAGNOSIS — N2581 Secondary hyperparathyroidism of renal origin: Secondary | ICD-10-CM | POA: Diagnosis not present

## 2015-07-13 DIAGNOSIS — N186 End stage renal disease: Secondary | ICD-10-CM | POA: Diagnosis not present

## 2015-07-13 DIAGNOSIS — D509 Iron deficiency anemia, unspecified: Secondary | ICD-10-CM | POA: Diagnosis not present

## 2015-07-13 DIAGNOSIS — D631 Anemia in chronic kidney disease: Secondary | ICD-10-CM | POA: Diagnosis not present

## 2015-07-13 DIAGNOSIS — N2581 Secondary hyperparathyroidism of renal origin: Secondary | ICD-10-CM | POA: Diagnosis not present

## 2015-07-14 DIAGNOSIS — I10 Essential (primary) hypertension: Secondary | ICD-10-CM | POA: Diagnosis not present

## 2015-07-14 DIAGNOSIS — Z992 Dependence on renal dialysis: Secondary | ICD-10-CM | POA: Diagnosis not present

## 2015-07-14 DIAGNOSIS — N186 End stage renal disease: Secondary | ICD-10-CM | POA: Diagnosis not present

## 2015-07-15 DIAGNOSIS — N2581 Secondary hyperparathyroidism of renal origin: Secondary | ICD-10-CM | POA: Diagnosis not present

## 2015-07-15 DIAGNOSIS — D509 Iron deficiency anemia, unspecified: Secondary | ICD-10-CM | POA: Diagnosis not present

## 2015-07-15 DIAGNOSIS — N186 End stage renal disease: Secondary | ICD-10-CM | POA: Diagnosis not present

## 2015-07-15 DIAGNOSIS — D631 Anemia in chronic kidney disease: Secondary | ICD-10-CM | POA: Diagnosis not present

## 2015-07-17 DIAGNOSIS — N2581 Secondary hyperparathyroidism of renal origin: Secondary | ICD-10-CM | POA: Diagnosis not present

## 2015-07-17 DIAGNOSIS — N186 End stage renal disease: Secondary | ICD-10-CM | POA: Diagnosis not present

## 2015-07-17 DIAGNOSIS — D631 Anemia in chronic kidney disease: Secondary | ICD-10-CM | POA: Diagnosis not present

## 2015-07-17 DIAGNOSIS — D509 Iron deficiency anemia, unspecified: Secondary | ICD-10-CM | POA: Diagnosis not present

## 2015-07-20 DIAGNOSIS — D631 Anemia in chronic kidney disease: Secondary | ICD-10-CM | POA: Diagnosis not present

## 2015-07-20 DIAGNOSIS — N186 End stage renal disease: Secondary | ICD-10-CM | POA: Diagnosis not present

## 2015-07-20 DIAGNOSIS — N2581 Secondary hyperparathyroidism of renal origin: Secondary | ICD-10-CM | POA: Diagnosis not present

## 2015-07-20 DIAGNOSIS — D509 Iron deficiency anemia, unspecified: Secondary | ICD-10-CM | POA: Diagnosis not present

## 2015-07-22 DIAGNOSIS — D631 Anemia in chronic kidney disease: Secondary | ICD-10-CM | POA: Diagnosis not present

## 2015-07-22 DIAGNOSIS — N186 End stage renal disease: Secondary | ICD-10-CM | POA: Diagnosis not present

## 2015-07-22 DIAGNOSIS — N2581 Secondary hyperparathyroidism of renal origin: Secondary | ICD-10-CM | POA: Diagnosis not present

## 2015-07-22 DIAGNOSIS — D509 Iron deficiency anemia, unspecified: Secondary | ICD-10-CM | POA: Diagnosis not present

## 2015-07-25 ENCOUNTER — Encounter (HOSPITAL_COMMUNITY): Payer: Self-pay | Admitting: *Deleted

## 2015-07-25 ENCOUNTER — Inpatient Hospital Stay (HOSPITAL_COMMUNITY)
Admission: EM | Admit: 2015-07-25 | Discharge: 2015-07-26 | DRG: 204 | Disposition: A | Discharge status: Home / Self Care | Payer: Medicare Other | Attending: Infectious Disease | Admitting: Infectious Disease

## 2015-07-25 ENCOUNTER — Emergency Department (HOSPITAL_COMMUNITY): Payer: Medicare Other

## 2015-07-25 DIAGNOSIS — N2581 Secondary hyperparathyroidism of renal origin: Secondary | ICD-10-CM | POA: Diagnosis present

## 2015-07-25 DIAGNOSIS — R51 Headache: Secondary | ICD-10-CM | POA: Diagnosis present

## 2015-07-25 DIAGNOSIS — I776 Arteritis, unspecified: Secondary | ICD-10-CM | POA: Insufficient documentation

## 2015-07-25 DIAGNOSIS — F1410 Cocaine abuse, uncomplicated: Secondary | ICD-10-CM | POA: Diagnosis present

## 2015-07-25 DIAGNOSIS — Z7982 Long term (current) use of aspirin: Secondary | ICD-10-CM

## 2015-07-25 DIAGNOSIS — I120 Hypertensive chronic kidney disease with stage 5 chronic kidney disease or end stage renal disease: Secondary | ICD-10-CM | POA: Diagnosis not present

## 2015-07-25 DIAGNOSIS — D649 Anemia, unspecified: Secondary | ICD-10-CM | POA: Diagnosis present

## 2015-07-25 DIAGNOSIS — N186 End stage renal disease: Secondary | ICD-10-CM | POA: Diagnosis not present

## 2015-07-25 DIAGNOSIS — R042 Hemoptysis: Principal | ICD-10-CM | POA: Diagnosis present

## 2015-07-25 DIAGNOSIS — F17210 Nicotine dependence, cigarettes, uncomplicated: Secondary | ICD-10-CM | POA: Diagnosis present

## 2015-07-25 DIAGNOSIS — Z9114 Patient's other noncompliance with medication regimen: Secondary | ICD-10-CM | POA: Diagnosis not present

## 2015-07-25 DIAGNOSIS — E8770 Fluid overload, unspecified: Secondary | ICD-10-CM | POA: Diagnosis present

## 2015-07-25 DIAGNOSIS — J449 Chronic obstructive pulmonary disease, unspecified: Secondary | ICD-10-CM | POA: Diagnosis present

## 2015-07-25 DIAGNOSIS — R918 Other nonspecific abnormal finding of lung field: Secondary | ICD-10-CM | POA: Diagnosis not present

## 2015-07-25 DIAGNOSIS — Z992 Dependence on renal dialysis: Secondary | ICD-10-CM | POA: Diagnosis not present

## 2015-07-25 DIAGNOSIS — E875 Hyperkalemia: Secondary | ICD-10-CM | POA: Diagnosis not present

## 2015-07-25 DIAGNOSIS — R0989 Other specified symptoms and signs involving the circulatory and respiratory systems: Secondary | ICD-10-CM | POA: Insufficient documentation

## 2015-07-25 DIAGNOSIS — R030 Elevated blood-pressure reading, without diagnosis of hypertension: Secondary | ICD-10-CM | POA: Diagnosis not present

## 2015-07-25 DIAGNOSIS — F14288 Cocaine dependence with other cocaine-induced disorder: Secondary | ICD-10-CM | POA: Diagnosis not present

## 2015-07-25 HISTORY — DX: Disorder of kidney and ureter, unspecified: N28.9

## 2015-07-25 LAB — RENAL FUNCTION PANEL
ALBUMIN: 4 g/dL (ref 3.5–5.0)
ANION GAP: 20 — AB (ref 5–15)
ANION GAP: 15 (ref 5–15)
Albumin: 4.3 g/dL (ref 3.5–5.0)
BUN: 122 mg/dL — ABNORMAL HIGH (ref 6–20)
BUN: 54 mg/dL — ABNORMAL HIGH (ref 6–20)
CALCIUM: 8.7 mg/dL — AB (ref 8.9–10.3)
CALCIUM: 9.8 mg/dL (ref 8.9–10.3)
CO2: 22 mmol/L (ref 22–32)
CO2: 28 mmol/L (ref 22–32)
CREATININE: 15.1 mg/dL — AB (ref 0.61–1.24)
CREATININE: 8.87 mg/dL — AB (ref 0.61–1.24)
Chloride: 89 mmol/L — ABNORMAL LOW (ref 101–111)
Chloride: 93 mmol/L — ABNORMAL LOW (ref 101–111)
GFR calc Af Amer: 4 mL/min — ABNORMAL LOW (ref >60)
GFR calc Af Amer: 7 mL/min — ABNORMAL LOW (ref >60)
GFR calc non Af Amer: 3 mL/min — ABNORMAL LOW (ref >60)
GFR calc non Af Amer: 6 mL/min — ABNORMAL LOW (ref >60)
GLUCOSE: 87 mg/dL (ref 65–99)
GLUCOSE: 169 mg/dL — AB (ref 65–99)
PHOSPHORUS: 6.3 mg/dL — AB (ref 2.5–4.6)
Phosphorus: 3.9 mg/dL (ref 2.5–4.6)
Potassium: >7.5 mmol/L (ref 3.5–5.1)
Potassium: 4 mmol/L (ref 3.5–5.1)
SODIUM: 131 mmol/L — AB (ref 135–145)
SODIUM: 136 mmol/L (ref 135–145)

## 2015-07-25 LAB — COMPREHENSIVE METABOLIC PANEL
ALT: 18 U/L (ref 17–63)
AST: 13 U/L — ABNORMAL LOW (ref 15–41)
Albumin: 3.9 g/dL (ref 3.5–5.0)
Alkaline Phosphatase: 111 U/L (ref 38–126)
Anion gap: 20 — ABNORMAL HIGH (ref 5–15)
BUN: 118 mg/dL — ABNORMAL HIGH (ref 6–20)
CO2: 25 mmol/L (ref 22–32)
Calcium: 8.7 mg/dL — ABNORMAL LOW (ref 8.9–10.3)
Chloride: 91 mmol/L — ABNORMAL LOW (ref 101–111)
Creatinine, Ser: 15.2 mg/dL — ABNORMAL HIGH (ref 0.61–1.24)
GFR, EST AFRICAN AMERICAN: 4 mL/min — AB (ref >60)
GFR, EST NON AFRICAN AMERICAN: 3 mL/min — AB (ref >60)
Glucose, Bld: 75 mg/dL (ref 65–99)
POTASSIUM: 6.8 mmol/L — AB (ref 3.5–5.1)
Sodium: 136 mmol/L (ref 135–145)
TOTAL PROTEIN: 6.6 g/dL (ref 6.5–8.1)
Total Bilirubin: 0.6 mg/dL (ref 0.3–1.2)

## 2015-07-25 LAB — CBC WITH DIFFERENTIAL/PLATELET
BASOS ABS: 0 10*3/uL (ref 0.0–0.1)
BASOS PCT: 0 %
Eosinophils Absolute: 0.1 10*3/uL (ref 0.0–0.7)
Eosinophils Relative: 2 %
HEMATOCRIT: 36 % — AB (ref 39.0–52.0)
Hemoglobin: 12 g/dL — ABNORMAL LOW (ref 13.0–17.0)
Lymphocytes Relative: 23 %
Lymphs Abs: 1.4 10*3/uL (ref 0.7–4.0)
MCH: 32.6 pg (ref 26.0–34.0)
MCHC: 33.3 g/dL (ref 30.0–36.0)
MCV: 97.8 fL (ref 78.0–100.0)
Monocytes Absolute: 0.2 10*3/uL (ref 0.1–1.0)
Monocytes Relative: 4 %
NEUTROS ABS: 4.3 10*3/uL (ref 1.7–7.7)
Neutrophils Relative %: 71 %
PLATELETS: 94 10*3/uL — AB (ref 150–400)
RBC: 3.68 MIL/uL — AB (ref 4.22–5.81)
RDW: 14.5 % (ref 11.5–15.5)
WBC: 6.1 10*3/uL (ref 4.0–10.5)

## 2015-07-25 LAB — CBC
HCT: 36.5 % — ABNORMAL LOW (ref 39.0–52.0)
HEMOGLOBIN: 12.2 g/dL — AB (ref 13.0–17.0)
MCH: 32.5 pg (ref 26.0–34.0)
MCHC: 33.4 g/dL (ref 30.0–36.0)
MCV: 97.3 fL (ref 78.0–100.0)
PLATELETS: 86 10*3/uL — AB (ref 150–400)
RBC: 3.75 MIL/uL — ABNORMAL LOW (ref 4.22–5.81)
RDW: 14.5 % (ref 11.5–15.5)
WBC: 7.1 10*3/uL (ref 4.0–10.5)

## 2015-07-25 MED ORDER — DIPHENHYDRAMINE HCL 25 MG PO CAPS: 25.0000 mg | ORAL_CAPSULE | Freq: Four times a day (QID) | ORAL | Status: DC | PRN

## 2015-07-25 MED ORDER — ASPIRIN 81 MG PO CHEW
81.0000 mg | CHEWABLE_TABLET | Freq: Every day | ORAL | Status: DC
  Administered 2015-07-26: 81 mg via ORAL
  Filled 2015-07-25: qty 1

## 2015-07-25 MED ORDER — DEXTROSE 50 % IV SOLN: 1.0000 | Freq: Once | INTRAVENOUS | Status: DC

## 2015-07-25 MED ORDER — INSULIN ASPART 100 UNIT/ML IV SOLN: 10.0000 [IU] | Freq: Once | INTRAVENOUS | Status: DC

## 2015-07-25 MED ORDER — CINACALCET HCL 30 MG PO TABS
90.0000 mg | ORAL_TABLET | Freq: Every day | ORAL | Status: DC
Start: 2015-07-26 — End: 2015-07-26
  Administered 2015-07-26: 90 mg via ORAL
  Filled 2015-07-25: qty 3

## 2015-07-25 MED ORDER — RENA-VITE PO TABS
1.0000 | ORAL_TABLET | Freq: Every day | ORAL | Status: DC
  Administered 2015-07-25: 1 via ORAL
  Filled 2015-07-25: qty 1

## 2015-07-25 MED ORDER — PANTOPRAZOLE SODIUM 20 MG PO TBEC
20.0000 mg | DELAYED_RELEASE_TABLET | Freq: Every day | ORAL | Status: DC
  Administered 2015-07-26: 20 mg via ORAL
  Filled 2015-07-25: qty 1

## 2015-07-25 MED ORDER — AMLODIPINE BESYLATE 10 MG PO TABS
10.0000 mg | ORAL_TABLET | Freq: Every day | ORAL | Status: DC
  Administered 2015-07-25: 10 mg via ORAL
  Filled 2015-07-25: qty 1

## 2015-07-25 MED ORDER — CALCIUM CARBONATE ANTACID 500 MG PO CHEW: 500.0000 mg | CHEWABLE_TABLET | Freq: Every day | ORAL | Status: DC | PRN

## 2015-07-25 MED ORDER — NEPRO/CARBSTEADY PO LIQD: 237.0000 mL | Freq: Three times a day (TID) | ORAL | Status: DC | PRN

## 2015-07-25 MED ORDER — HYDROXYZINE HCL 25 MG PO TABS: 25.0000 mg | ORAL_TABLET | Freq: Three times a day (TID) | ORAL | Status: DC | PRN

## 2015-07-25 MED ORDER — IOHEXOL 300 MG/ML  SOLN
100.0000 mL | Freq: Once | INTRAMUSCULAR | Status: AC | PRN
  Administered 2015-07-25: 100 mL via INTRAVENOUS

## 2015-07-25 MED ORDER — SEVELAMER CARBONATE 800 MG PO TABS
1600.0000 mg | ORAL_TABLET | Freq: Three times a day (TID) | ORAL | Status: DC
  Administered 2015-07-26 (×3): 1600 mg via ORAL
  Filled 2015-07-25 (×3): qty 2

## 2015-07-25 MED ORDER — CINACALCET HCL 30 MG PO TABS
60.0000 mg | ORAL_TABLET | Freq: Every day | ORAL | Status: DC
  Administered 2015-07-26: 60 mg via ORAL
  Filled 2015-07-25: qty 2

## 2015-07-25 MED ORDER — CARVEDILOL 25 MG PO TABS: 37.5000 mg | ORAL_TABLET | Freq: Two times a day (BID) | ORAL | Status: DC

## 2015-07-25 MED ORDER — LEVETIRACETAM 500 MG PO TABS
1000.0000 mg | ORAL_TABLET | Freq: Two times a day (BID) | ORAL | Status: DC
  Administered 2015-07-25 – 2015-07-26 (×2): 1000 mg via ORAL
  Filled 2015-07-25 (×2): qty 2

## 2015-07-25 MED ORDER — LOSARTAN POTASSIUM 50 MG PO TABS
100.0000 mg | ORAL_TABLET | Freq: Every day | ORAL | Status: DC
  Administered 2015-07-25: 100 mg via ORAL
  Filled 2015-07-25: qty 2

## 2015-07-25 MED ORDER — CLONIDINE HCL 0.2 MG PO TABS
0.2000 mg | ORAL_TABLET | Freq: Two times a day (BID) | ORAL | Status: DC
  Administered 2015-07-25: 0.2 mg via ORAL
  Filled 2015-07-25: qty 1

## 2015-07-25 NOTE — Consult Note (Signed)
[facility] Renal Consultation Note  Indication for Consultation:  Management of ESRD/hemodialysis; anemia, hypertension/volume and secondary hyperparathyroidism  HPI: Jeremy Mccoy is a 50 y.o. male on chronic HD  MWF ( [facility]), Missed last HD Friday 07/24/15 ="was taking Crack  Cocaine, started coughing up Bright red blood /became concerned  and came to ER."  He denies  fevers, chills, sob, chest pain, N/V/D/ ,[place]. Of note has not ran a complete tx this month at op kidney center , signs off early/and past week Dr. Augustin Coupe  Preformed PTA of L FA AVF [id number] cephalic vein stenosis /and no reported problems at op [facility] center using AVF.       Past Medical History  Diagnosis Date  . COPD (chronic obstructive pulmonary disease) ([place])   . Crack cocaine use   . Dental caries   . HTN (hypertension) 06/12/2012  . Hematemesis/vomiting blood   . Shortness of breath     "just when I have too much potassium is too high" (05/15/2013)  . Anemia   . History of blood transfusion 10/2011; 04/2013  . Headache(784.0)     "get it from going to dialysis; when they get real bad I come to hospital" (05/15/2013)  . Arthritis     "qwhere" (05/15/2013)  . End stage renal disease ([place]) 01/11/2012    Patient presented 11/04/11 to hospital with CP, wt loss and N/V. Creat was 10, admitted. 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 ANA was + 1:80, +MPO and +pr3 Ab's (ANCA). Received plasmapheresis x 7, IV cytoxan and pred taper. First HD was 11/17/11. 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 Pt d/c'd to do outpt HD and may have gotten 1-2 additional Cytoxan as OP, but this was stopped eventually since renal function didn't recover. Now gets HD TTS schedule at [facility].  L forearm AVF 11/19/11 by Dr. Scot Dock is current access.    Marland Kitchen ESRD (end stage renal disease) on  dialysis [facility])     TTS: [address] [place] (05/15/2013)  . ESRD (end stage renal disease) on dialysis ([place])   . Headache(784.0)   . Depression   . Anxiety     Past Surgical History  Procedure Laterality Date  . Av fistula placement  11/29/2011    Procedure: ARTERIOVENOUS (AV) FISTULA CREATION;  Surgeon: Angelia Mould, MD;  Location: [facility];  Service: Vascular;  Laterality: Left;  . Renal biopsy  11/07/2011  . Inguinal hernia repair Bilateral 1967  . Esophagogastroduodenoscopy N/A 05/17/2013    Procedure: ESOPHAGOGASTRODUODENOSCOPY (EGD);  Surgeon: Beryle Beams, MD;  Location: [facility] ENDOSCOPY;  Service: Endoscopy;  Laterality: N/A;      Family History  Problem Relation Age of Onset  . Heart attack Father       reports that he has been smoking Cigars and Cigarettes.  He has a 35 pack-year smoking history. He has never used smokeless tobacco. He reports that he uses illicit drugs (Marijuana and Cocaine). He reports that he does not drink alcohol.  No Known Allergies  Prior to Admission medications   Medication Sig Start Date End Date Taking? Authorizing Provider  acetaminophen (TYLENOL) 500 MG tablet Take 1,000 mg by mouth daily at 6 (six) AM. 6 times a day, 500 mg, 12 to 24 tabs a day, 6,000 to 12,000 mg daily for pain   Yes Historical Provider, MD  amLODipine (NORVASC) 10 MG tablet Take 1 tablet (10 mg total) by mouth daily. 01/25/14  Yes Nino Glow McLean-Scocozza, MD  aspirin 81 MG chewable tablet Chew 1 tablet (81 mg total) by mouth daily. 01/25/14  Yes Nino Glow McLean-Scocozza, MD  calcium carbonate (TUMS - DOSED IN MG ELEMENTAL CALCIUM) 500 MG chewable tablet Chew 500 mg by mouth daily as needed for indigestion or heartburn.    Yes Historical Provider, MD  carvedilol (COREG) 25 MG tablet Take 1.5 tablets (37.5 mg total) by mouth 2 (two) times daily with a meal. On Monday-Wednesday-Friday-Sunday (non-HD days); on dialysis days (Tuesday-Thursday-Saturday) take it once a day (evening  time only). 01/30/14  Yes Mihai Croitoru, MD  cinacalcet (SENSIPAR) 30 MG tablet Take 60 mg by mouth daily.    Yes Historical Provider, MD  cloNIDine (CATAPRES) 0.2 MG tablet Take 1 tablet (0.2 mg total) by mouth 2 (two) times daily. 11/08/13  Yes Reyne Dumas, MD  diphenhydrAMINE (BENADRYL) 25 MG tablet Take 25 mg by mouth every 6 (six) hours as needed for itching.   Yes Historical Provider, MD  hydrOXYzine (ATARAX/VISTARIL) 25 MG tablet Take 25 mg by mouth 3 (three) times daily as needed for itching.  06/10/15  Yes Historical Provider, MD  levETIRAcetam (KEPPRA) 1000 MG tablet Take 1 tablet (1,000 mg total) by mouth 2 (two) times daily. 04/01/15  Yes Marcial Pacas, MD  losartan (COZAAR) 100 MG tablet Take 100 mg by mouth at bedtime.   Yes Historical Provider, MD  multivitamin (RENA-VIT) TABS tablet Take 1 tablet by mouth at bedtime. 11/29/11  Yes Alric Seton, PA-C  Nutritional Supplements (FEEDING SUPPLEMENT, NEPRO CARB STEADY,) LIQD Take 237 mLs by mouth 3 (three) times daily as needed (Supplement). 05/17/13  Yes Maryann Mikhail, DO  pantoprazole (PROTONIX) 20 MG tablet Take 20 mg by mouth daily. 07/20/15  Yes Historical Provider, MD  SENSIPAR 90 MG tablet Take 90 mg by mouth daily. 07/01/15  Yes Historical Provider, MD  sevelamer carbonate (RENVELA) 800 MG tablet Take 2 tablets (1,600 mg total) by mouth 3 (three) times daily with meals. 01/25/14  Yes Nino Glow McLean-Scocozza, MD  pantoprazole (PROTONIX) 40 MG tablet Take 1 tablet (40 mg total) by mouth daily. Patient not taking: Reported on 07/25/2015 01/25/14   Nino Glow McLean-Scocozza, MD     Anti-infectives    None      Results for orders placed or performed during the hospital encounter of 07/25/15 (from the past 48 hour(s))  Type and screen [place]     Status: None (Preliminary result)   Collection Time: 07/25/15 10:05 AM  Result Value Ref Range   ABO/RH(D) O POS    Antibody Screen PENDING    Sample Expiration 07/28/2015    CBC with Differential/Platelet     Status: Abnormal (Preliminary result)   Collection Time: 07/25/15 10:16 AM  Result Value Ref Range   WBC 6.1 4.0 - 10.5 K/uL   RBC 3.68 (L) 4.22 - 5.81 MIL/uL   Hemoglobin 12.0 (L) 13.0 - 17.0 g/dL   HCT 36.0 (L) 39.0 - 52.0 %   MCV 97.8 78.0 - 100.0 fL   MCH 32.6 26.0 - 34.0 pg   MCHC 33.3 30.0 - 36.0 g/dL   RDW 14.5 11.5 - 15.5 %   Platelets PENDING 150 - 400 K/uL   Neutrophils Relative % 71 %   Neutro Abs 4.3 1.7 - 7.7 K/uL   Lymphocytes Relative 23 %   Lymphs Abs 1.4 0.7 - 4.0  K/uL   Monocytes Relative 4 %   Monocytes Absolute 0.2 0.1 - 1.0 K/uL   Eosinophils Relative 2 %   Eosinophils Absolute 0.1 0.0 - 0.7 K/uL   Basophils Relative 0 %   Basophils Absolute 0.0 0.0 - 0.1 K/uL     ROS: see hpi  Physical Exam:  Temp 98.2 bp 183/120, 91 % o2sat,  &4.3 kg , 23 resp rate 77 pulse  General: alert thin Chronically ill WM/ NAD/Ox3 HEENT: [place], MMdry. EOMI Neck: supple , no jvd Heart: RRR soft 1/6 sem  No rub, gallop  Lungs: faint L coarse BS sound Abdomen: Bs pos. Soft nt, nd  Extremities: No pedal dema Skin: No overt rash Neuro:  Alert / appropriate moves all extrem no acute overt deficits noted  Dialysis Access:pos bruit LFA AVF   Dialysis Orders: Center: [place]   on mwf  . EDW 70 kg  HD Bath 2k, 2.25ca   Time 4 hr 43min  Heparin none. Access L FA AVF     Hectorol 74mcg  mcg IV/HD    Units IV/HD  Venofer  50mg  weekly  Other op Labs  hgb 12.9 ca 9.0 phos 6.2 pth 1030  Assessment/Plan 1. Hyperkalemia - sec to missed HD in setting of Cocaine use  And hemoptysis= place on emergent HD  2. ESRD -  Nl schedule MWF  Hd today  Sec ^k  3. Hemoptysis   Wu per admit 4. Hypertension/volume  - missed hd/ Coccaine/ ?? bp med(norvasc, coreg, clonidine, losartin listed as op  compliance all playing some role 5. Anemia  - no esa with HGB 12 / weekly fe on hd  6. Metabolic bone disease -  Iv Hectorol with pth >1,000 / phos binder=,renvela/ Tums /  q  day  Sensipar  7. Cocaine / substance abuse-  Ernest Haber, PA-C [place] [phone] 07/25/2015, 10:57 AM   Pt seen, examined and agree w A/P as above. Extra HD today 3.5 hours.  UF to dry wt 4kg goal.   Kelly Splinter MD [place] pager [phone]    cell [phone] 07/25/2015, 1:25 PM

## 2015-07-25 NOTE — Progress Notes (Signed)
Arrived from Hemodialysis was walking down the hallway toward to his room with hemodialysis nurse.Placed in bed in comfortable position.No skin issues observed.

## 2015-07-25 NOTE — ED Notes (Signed)
Pt still in CT

## 2015-07-25 NOTE — ED Notes (Signed)
Pt arrives from home via [facility]. Pt states he woke up at 0100 this AM with SOB and hemoptysis. Pt receives dialysis MWF but didn't go yesterday. Pt endorses smoking crack yesterday. Rales bilaterally.

## 2015-07-25 NOTE — Procedures (Signed)
  I was present at this dialysis session, have reviewed the session itself and made  appropriate changes Kelly Splinter MD [place] pager [phone]    cell [phone] 07/25/2015, 1:29 PM

## 2015-07-25 NOTE — H&P (Signed)
Date: 07/25/2015               Patient Name:  Jeremy Mccoy MRN: [id number]  DOB: 12-12-1965 Age / Sex: 50 y.o., male   PCP: Leamon Arnt, MD         Medical Service: Internal Medicine Teaching Service         Attending Physician: Dr. Truman Hayward, MD    First Contact: Dr. Benjamine Mola Pager: [phone]  Second Contact: Dr. Hulen Luster Pager: [phone]       After Hours (After 5p/  First Contact Pager: [phone]  weekends / holidays): Second Contact Pager: [phone]   Chief Complaint: Hemopytsis  History of Present Illness: 50 y/o white man with PMHx of COPD, hypertension, ESRD on HD MWF, cocaine abuse presented to ED with with hemoptysis and shortness of breath. The patient reports missing his last HD session and daily crack cocaine use, last at 11pm Friday night. At around 1am he started coughing scant bright red blood. This continued intermittently until approximately 8am this morning when he started to feel crackles in his breathing and becoming short of breath. He denies abdominal pain, nausea, vomiting. He denies any weakness or dizziness. He did have mild chest pain associated with his cough.  After arrival to the ED he was observed to continue coughing scant bright red blood. CT chest obtained showing likely endobronchial hemorrhage in RLL as well as evidence of bronchiolitis and old granulomatous disease. Potassium of >7.5 without acute EKG changes and patient taken for emergent hemodialysis.  Meds: Current Facility-Administered Medications  Medication Dose Route Frequency Provider Last Rate Last Dose  . amLODipine (NORVASC) tablet 10 mg  10 mg Oral Daily Norman Herrlich, MD   10 mg at 07/25/15 2027  . [START ON 07/26/2015] aspirin chewable tablet 81 mg  81 mg Oral Daily Norman Herrlich, MD      . calcium carbonate (TUMS - dosed in mg elemental calcium) chewable tablet 500 mg  500 mg Oral Daily PRN Norman Herrlich, MD      . Derrill Memo ON 07/26/2015] carvedilol (COREG) tablet 37.5 mg  37.5 mg  Oral BID WC Norman Herrlich, MD      . Derrill Memo ON 07/26/2015] cinacalcet (SENSIPAR) tablet 60 mg  60 mg Oral Q supper Norman Herrlich, MD      . Derrill Memo ON 07/26/2015] cinacalcet (SENSIPAR) tablet 90 mg  90 mg Oral QAC breakfast Norman Herrlich, MD      . cloNIDine (CATAPRES) tablet 0.2 mg  0.2 mg Oral BID Norman Herrlich, MD      . dextrose 50 % solution 50 mL  1 ampule Intravenous Once Courteney Lyn Mackuen, MD      . diphenhydrAMINE (BENADRYL) capsule 25 mg  25 mg Oral Q6H PRN Norman Herrlich, MD      . feeding supplement (NEPRO CARB STEADY) liquid 237 mL  237 mL Oral TID PRN Norman Herrlich, MD      . hydrOXYzine (ATARAX/VISTARIL) tablet 25 mg  25 mg Oral TID PRN Norman Herrlich, MD      . insulin aspart (novoLOG) injection 10 Units  10 Units Intravenous Once Courteney Lyn Mackuen, MD      . levETIRAcetam (KEPPRA) tablet 1,000 mg  1,000 mg Oral BID Norman Herrlich, MD      . losartan (COZAAR) tablet 100 mg  100 mg Oral QHS Norman Herrlich, MD      . multivitamin (RENA-VIT)  tablet 1 tablet  1 tablet Oral QHS Norman Herrlich, MD      . Derrill Memo ON 07/26/2015] pantoprazole (PROTONIX) EC tablet 20 mg  20 mg Oral Daily Norman Herrlich, MD      . Derrill Memo ON 07/26/2015] sevelamer carbonate (RENVELA) tablet 1,600 mg  1,600 mg Oral TID WC Norman Herrlich, MD        Allergies: Allergies as of 07/25/2015  . (No Known Allergies)   Past Medical History  Diagnosis Date  . COPD (chronic obstructive pulmonary disease) ([place])   . Crack cocaine use   . Dental caries   . HTN (hypertension) 06/12/2012  . Hematemesis/vomiting blood   . Shortness of breath     "just when I have too much potassium is too high" (05/15/2013)  . Anemia   . History of blood transfusion 10/2011; 04/2013  . Headache(784.0)     "get it from going to dialysis; when they get real bad I come to hospital" (05/15/2013)  . Arthritis     "qwhere" (05/15/2013)  . End stage renal disease ([place]) 01/11/2012    Patient presented 11/04/11 to hospital with CP, wt  loss and N/V. Creat was 10, admitted. 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 ANA was + 1:80, +MPO and +pr3 Ab's (ANCA). Received plasmapheresis x 7, IV cytoxan and pred taper. First HD was 11/17/11. 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 Pt d/c'd to do outpt HD and may have gotten 1-2 additional Cytoxan as OP, but this was stopped eventually since renal function didn't recover. Now gets HD TTS schedule at [facility].  L forearm AVF 11/19/11 by Dr. Scot Dock is current access.    Marland Kitchen ESRD (end stage renal disease) on dialysis [facility])     TTS: [address] [place] (05/15/2013)  . ESRD (end stage renal disease) on dialysis ([place])   . Headache(784.0)   . Depression   . Anxiety   . Renal insufficiency    Past Surgical History  Procedure Laterality Date  . Av fistula placement  11/29/2011    Procedure: ARTERIOVENOUS (AV) FISTULA CREATION;  Surgeon: Angelia Mould, MD;  Location: [place];  Service: Vascular;  Laterality: Left;  . Renal biopsy  11/07/2011  . Inguinal hernia repair Bilateral 1967  . Esophagogastroduodenoscopy N/A 05/17/2013    Procedure: ESOPHAGOGASTRODUODENOSCOPY (EGD);  Surgeon: Beryle Beams, MD;  [facility]: [facility] ENDOSCOPY;  Service: Endoscopy;  Laterality: N/A;   Family History  Problem Relation Age of Onset  . Heart attack Father    Social History   Social History  . Marital Status: Single    Spouse Name: N/A  . Number of Children: 0  . Years of Education: 9 th   Occupational History  . floor insulation     Disabled   Social History Main Topics  . Smoking status: Current Every Day Smoker -- 1.00 packs/day for 35 years    Types: Cigars, Cigarettes  . Smokeless tobacco: Never Used  . Alcohol Use: No     Comment: 05/15/2013 "aien't drank in ~ 25 yrs"  . Drug Use: Yes    Special: Marijuana, Cocaine     Comment: Precription drugs (e.g.  percocet) "I take what I have to to controll my constant pain" (05/15/2013)  . Sexual Activity: No   Other Topics Concern  . Not on file   Social History Narrative   **  Merged History Encounter **   Patient lives at home with his parents and he is disabled.   Education 9th grade education   Right handed   Caffeine one cup daily        Review of Systems: Review of Systems  Constitutional: Positive for weight loss. Negative for fever.  Eyes: Negative for blurred vision.  Respiratory: Positive for cough and hemoptysis.   Cardiovascular: Positive for chest pain. Negative for leg swelling.  Gastrointestinal: Negative for nausea, vomiting, abdominal pain and diarrhea.  Genitourinary: Negative for dysuria.  Musculoskeletal: Negative for back pain.  Skin: Negative for rash.  Neurological: Positive for headaches. Negative for dizziness.  Endo/Heme/Allergies: Does not bruise/bleed easily.  Psychiatric/Behavioral: Positive for substance abuse.    Physical Exam: Blood pressure 193/116, pulse 102, temperature 98.4 F (36.9 C), temperature source Oral, resp. rate 17, weight 70.5 kg (155 lb 6.8 oz), SpO2 92 %. GENERAL- chronically ill appearing, thin man, in NAD HEENT- Oral mucosa appears dry, no JVD CARDIAC- RRR, no murmurs, rubs or gallops. RESP- Diffuse coarse breath sounds in all fields ABDOMEN- Soft, nontender, no guarding or rebound, normoactive bowel sounds present BACK-  no paraspinal tenderness, no CVA tenderness. NEURO- No obvious Cr N abnormality, strength upper and lower extremities- 5/5 EXTREMITIES- symmetric, no pedal edema, LUE forearm AVF with palpable thrill SKIN- Warm, dry, No rash or lesion. PSYCH- Appropriate mood and affect,   Lab results: Basic Metabolic Panel:  Recent Labs  07/25/15 1257 07/25/15 1757  NA 131* 136  K >7.5* 4.0  CL 89* 93*  CO2 22 28  GLUCOSE 87 169*  BUN 122* 54*  CREATININE 15.10* 8.87*  CALCIUM 8.7* 9.8  PHOS 6.3* 3.9   Liver  Function Tests:  Recent Labs  07/25/15 1016 07/25/15 1257 07/25/15 1757  AST 13*  --   --   ALT 18  --   --   ALKPHOS 111  --   --   BILITOT 0.6  --   --   PROT 6.6  --   --   ALBUMIN 3.9 4.0 4.3   CBC:  Recent Labs  07/25/15 1016 07/25/15 1258  WBC 6.1 7.1  NEUTROABS 4.3  --   HGB 12.0* 12.2*  HCT 36.0* 36.5*  MCV 97.8 97.3  PLT 94* 86*    Drugs of Abuse     Component Value Date/Time   LABOPIA NEGATIVE 11/05/2011 1441   COCAINSCRNUR POSITIVE* 11/05/2011 1441   LABBENZ NEGATIVE 11/05/2011 1441   AMPHETMU NEGATIVE 11/05/2011 1441     Imaging results:  Ct Chest W Contrast  07/25/2015  CLINICAL DATA:  50 year old male with history of hemoptysis after smoking crack and missing dialysis appointment. EXAM: CT CHEST WITH CONTRAST TECHNIQUE: Multidetector CT imaging of the chest was performed during intravenous contrast administration. CONTRAST:  162mL OMNIPAQUE IOHEXOL 300 MG/ML  SOLN COMPARISON:  Chest CT 11/10/2011. FINDINGS: Mediastinum/Lymph Nodes: Heart size is enlarged with left atrial dilatation. There is no significant pericardial fluid, thickening or pericardial calcification. There is atherosclerosis of the thoracic aorta, the great vessels of the mediastinum and the coronary arteries, including calcified atherosclerotic plaque in the distal left main and proximal left anterior descending coronary arteries. Multiple mildly enlarged and borderline enlarged mediastinal and bilateral hilar lymph nodes, largest of which measures up to 1.4 cm in the right hilar region, many of which are densely calcified. Esophagus is unremarkable in appearance. No axillary lymphadenopathy. Lungs/Pleura: There is a background of diffuse centrilobular ground-glass attenuation micronodularity throughout all aspects  of the lungs bilaterally, presumably a smoking related disease such as respiratory bronchiolitis or respiratory bronchiolitis interstitial lung disease. Scattered small calcified  granulomas are also noted. In addition, there is extensive peribronchovascular ground-glass attenuation which is predominantly in the right lower lobe, and to a lesser degree in the left lower lobe. Given the patient's history of hemoptysis, findings are concerning for source of hemoptysis in the right lower lobe (favored to be in the superior segment), with some endobronchial spread of hemorrhage to other areas of the lungs. Very mild paraseptal emphysema. No pleural effusions. Upper Abdomen: Severe bilateral renal atrophy in the visualized portions of the abdomen. Musculoskeletal/Soft Tissues: There are no aggressive appearing lytic or blastic lesions noted in the visualized portions of the skeleton. IMPRESSION: 1. Given the patient's history of hemoptysis, findings are concerning for source of hemoptysis in the right lower lobe, likely in the superior segment, with some endobronchial spread of hemorrhage 2 other areas of the lungs, predominantly in the basal segments of the right lower lobe and left lower lobe. 2. Stigmata of smoking related disease such as respiratory bronchiolitis (RB), or if the patient is symptomatic, respiratory bronchiolitis interstitial lung disease (RB-ILD). 3. Cardiomegaly with left atrial dilatation. 4. Atherosclerosis, including left main and left anterior descending coronary artery disease. Please note that although the presence of coronary artery calcium documents the presence of coronary artery disease, the severity of this disease and any potential stenosis cannot be assessed on this non-gated CT examination. Assessment for potential risk factor modification, dietary therapy or pharmacologic therapy may be warranted, if clinically indicated. 47. Stigmata of old granulomatous disease redemonstrated. Electronically Signed   By: Vinnie Langton M.D.   On: 07/25/2015 11:55   Dg Chest Portable 1 View  07/25/2015  CLINICAL DATA:  50 year old male with a history of shortness of breath.  A hemoptysis. EXAM: PORTABLE CHEST 1 VIEW COMPARISON:  01/24/2014, 01/22/2014, CT 11/10/2011 FINDINGS: Cardiomediastinal silhouette unchanged, with cardiomegaly. No pneumothorax or pleural effusion. Bronchial wall thickening on the right, more pronounced at the right medial base than on the comparison. Micro nodular appearance of the bilateral lungs, similar to comparison plain film. IMPRESSION: Bronchial wall thickening with ill-defined interstitial opacities at the right medial base, more pronounced than the comparison. Nonspecific, potentially chronic or acute on chronic changes, including that related to bronchitis or aspiration pneumonitis. Micro nodular appearance of the bilateral lungs, which was present on prior plain film and CT dating to 11/10/2011. In the absence of known malignancy, this may represent chronic changes of the lymphatics, potentially related to prior episodes of CHF, partially calcified granulomas, or potentially a manifestation of sarcoid. Correlation with patient history may be useful. Signed, Dulcy Fanny. Earleen Newport, DO [facility] Electronically Signed   By: Corrie Mckusick D.O.   On: 07/25/2015 10:45    Other results: EKG: Sinus rhythm with some repolarization abnomality, questionable L sided hypertrophy  Assessment & Plan by Problem: Hemopytsis: Acute onset of hemoptysis, says he had it once before approximately 4.5 yrs ago. Seen in PM after HD he is essentially asymptomatic. 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 Also has a Hx of upper GI irritation and could be alternative bleeding source although no epigastric, nausea, vomiting, bloody stools symptoms. Needs to stop smoking cocaine. -F/U AM CBC -Plan bronchoscopy per pulmonary after resolving acute presentation due to inability to visualize well through large blood in airway  ESRD on HD: HD with resolution of metabolic abnormalities. SOB  resolved and chest  pain improved. He complains of severe headache every time he is on dialysis being the main reason for leaving early and missing sessions. 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 -HD per nephro -MBD per Nephro -F/U AM chem panel  Cocaine abuse: Very longstanding problem, has undergone numerous rehabilitation and detox programs. States his pain and fatigue related to hemodialysis are a major driving force. Might benefit if he could detox enough to be considered in a pain program. -Consult to social work  HTN: High and remaining high after HD. On multiple medications at home. Cocaine use and med noncompliance. -Amlodipine 10mg  -Coreg 37.5mg  BIDWM -Clonidine 0.2mg  BID -Losartan 100mg  qhs  COPD: No more dyspnea after HD. Does not appear to be on/using inhaler therapies. Chronic 1ppd smoker in addition to daily crack cocaine user.  Diet: Renal VTE ppx: [place] heparin FULL CODE  Dispo: Disposition is deferred at this time, awaiting improvement of current medical problems. Anticipated discharge in approximately 2-4 day(s).   The patient does have a current PCP Leamon Arnt, MD) and does need an [facility] hospital follow-up appointment after discharge.  The patient does not know have transportation limitations that hinder transportation to clinic appointments.  Signed: Collier Salina, MD 07/25/2015, 9:11 PM

## 2015-07-25 NOTE — ED Provider Notes (Signed)
CSN: [id number]     Arrival date & time 07/25/15  [id number] History   First MD Initiated Contact with Patient 07/25/15 [id number]     Chief Complaint  Patient presents with  . Shortness of Breath  . Hemoptysis     (Consider location/radiation/quality/duration/timing/severity/associated sxs/prior Treatment) HPI   Patient is a 50 year old male with history of COPD, hypertension, anemia end-stage renal disease on dialysis.  Patient presenting today with hemoptysis. Patient reports smoking crack cocaine yesterday around 11 PM. At 1 AM he started coughing up bright red blood. He reports  coughing up a small amount of bright red blood repetitively since last night.. Patient reports no subjective shortness of breath  Patient missed dialysis yesterday. Last dialysis was Wednesday. Patient never has to have emergent dialysis after missing dialysis. He does not usually have shortness of breath between dialysis sessions.  Patient had no recent fevers, myalgias, infectious symptoms.  Past Medical History  Diagnosis Date  . COPD (chronic obstructive pulmonary disease) ([place])   . Crack cocaine use   . Dental caries   . HTN (hypertension) 06/12/2012  . Hematemesis/vomiting blood   . Shortness of breath     "just when I have too much potassium is too high" (05/15/2013)  . Anemia   . History of blood transfusion 10/2011; 04/2013  . Headache(784.0)     "get it from going to dialysis; when they get real bad I come to hospital" (05/15/2013)  . Arthritis     "qwhere" (05/15/2013)  . End stage renal disease ([place]) 01/11/2012    Patient presented 11/04/11 to hospital with CP, wt loss and N/V. Creat was 10, admitted. 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 ANA was + 1:80, +MPO and +pr3 Ab's (ANCA). Received plasmapheresis x 7, IV cytoxan and pred taper. First HD was 11/17/11. 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 Pt d/c'd to do outpt HD and may have gotten 1-2 additional Cytoxan as OP, but this was stopped eventually since renal function didn't recover. Now gets HD TTS schedule at [facility].  L forearm AVF 11/19/11 by Dr. Scot Dock is current access.    Marland Kitchen ESRD (end stage renal disease) on dialysis [facility])     TTS: [address] [place] (05/15/2013)  . ESRD (end stage renal disease) on dialysis ([place])   . Headache(784.0)   . Depression   . Anxiety    Past Surgical History  Procedure Laterality Date  . Av fistula placement  11/29/2011    Procedure: ARTERIOVENOUS (AV) FISTULA CREATION;  Surgeon: Angelia Mould, MD;  Location: [place];  Service: Vascular;  Laterality: Left;  . Renal biopsy  11/07/2011  . Inguinal hernia repair Bilateral 1967  . Esophagogastroduodenoscopy N/A 05/17/2013    Procedure: ESOPHAGOGASTRODUODENOSCOPY (EGD);  Surgeon: Beryle Beams, MD;  Location: [facility] ENDOSCOPY;  Service: Endoscopy;  Laterality: N/A;   Family History  Problem Relation Age of Onset  . Heart attack Father    Social History  Substance Use Topics  . Smoking status: Current Every Day Smoker -- 1.00 packs/day for 35 years    Types: Cigars, Cigarettes  . Smokeless tobacco: Never Used  . Alcohol Use: No     Comment: 05/15/2013 "aien't drank in ~ 25 yrs"    Review of Systems  Constitutional: Negative for fever, activity change and fatigue.  HENT: Negative for congestion.   Respiratory: Positive for cough.  Negative for shortness of breath and wheezing.   Cardiovascular: Negative for chest pain.  Gastrointestinal: Negative for abdominal pain.  Skin: Negative for color change.  Neurological: Negative for headaches.  Psychiatric/Behavioral: Negative for agitation.      Allergies  Review of patient's allergies indicates no known allergies.  Home Medications   Prior to Admission medications   Medication Sig Start Date End Date Taking? Authorizing Provider  acetaminophen  (TYLENOL) 500 MG tablet Take 1,000 mg by mouth every 6 (six) hours as needed for mild pain.    Yes Historical Provider, MD  amLODipine (NORVASC) 10 MG tablet Take 1 tablet (10 mg total) by mouth daily. 01/25/14  Yes Nino Glow McLean-Scocozza, MD  aspirin 81 MG chewable tablet Chew 1 tablet (81 mg total) by mouth daily. 01/25/14  Yes Nino Glow McLean-Scocozza, MD  carvedilol (COREG) 25 MG tablet Take 1.5 tablets (37.5 mg total) by mouth 2 (two) times daily with a meal. On Monday-Wednesday-Friday-Sunday (non-HD days); on dialysis days (Tuesday-Thursday-Saturday) take it once a day (evening time only). 01/30/14  Yes Mihai Croitoru, MD  cinacalcet (SENSIPAR) 30 MG tablet Take 60 mg by mouth daily.    Yes Historical Provider, MD  cloNIDine (CATAPRES) 0.2 MG tablet Take 1 tablet (0.2 mg total) by mouth 2 (two) times daily. 11/08/13  Yes Reyne Dumas, MD  diphenhydrAMINE (BENADRYL) 25 MG tablet Take 25 mg by mouth every 6 (six) hours as needed for itching.   Yes Historical Provider, MD  levETIRAcetam (KEPPRA) 1000 MG tablet Take 1 tablet (1,000 mg total) by mouth 2 (two) times daily. 04/01/15  Yes Marcial Pacas, MD  losartan (COZAAR) 100 MG tablet Take 100 mg by mouth at bedtime.   Yes Historical Provider, MD  multivitamin (RENA-VIT) TABS tablet Take 1 tablet by mouth at bedtime. 11/29/11  Yes Alric Seton, PA-C  Nutritional Supplements (FEEDING SUPPLEMENT, NEPRO CARB STEADY,) LIQD Take 237 mLs by mouth 3 (three) times daily as needed (Supplement). 05/17/13  Yes Maryann Mikhail, DO  pantoprazole (PROTONIX) 40 MG tablet Take 1 tablet (40 mg total) by mouth daily. 01/25/14  Yes Nino Glow McLean-Scocozza, MD  calcium carbonate (TUMS - DOSED IN MG ELEMENTAL CALCIUM) 500 MG chewable tablet Chew 500 mg by mouth daily as needed.    Historical Provider, MD  hydrOXYzine (ATARAX/VISTARIL) 25 MG tablet Take 25 mg by mouth 3 (three) times daily as needed. 06/10/15   Historical Provider, MD  pantoprazole (PROTONIX) 20 MG tablet Take 20 mg  by mouth daily. 07/20/15   Historical Provider, MD  SENSIPAR 90 MG tablet Take 90 mg by mouth daily. 07/01/15   Historical Provider, MD  sevelamer carbonate (RENVELA) 800 MG tablet Take 2 tablets (1,600 mg total) by mouth 3 (three) times daily with meals. 01/25/14   Nino Glow McLean-Scocozza, MD   SpO2 99% Physical Exam  Constitutional: He is oriented to person, place, and time.  HENT:  Head: Normocephalic.  Mouth/Throat: Oropharynx is clear and moist.  Eyes: Conjunctivae are normal.  Neck: No tracheal deviation present.  Cardiovascular: Normal rate.   Pulmonary/Chest: Effort normal. No stridor. He has rales.  Patient has rales in bilateral lungs anterior more than posterior.  Abdominal: Soft. There is no tenderness. There is no guarding.  Musculoskeletal: Normal range of motion. He exhibits no edema.  Neurological: He is oriented to person, place, and time. No cranial nerve deficit.  Skin: Skin is warm and dry. No rash noted. He is not diaphoretic.  Fistula present.  Psychiatric: He has a  normal mood and affect.  Nursing note and vitals reviewed.   ED Course  Procedures (including critical care time) Labs Review Labs Reviewed  COMPREHENSIVE METABOLIC PANEL  CBC WITH DIFFERENTIAL/PLATELET  TYPE AND SCREEN    Imaging Review No results found. I have personally reviewed and evaluated these images and lab results as part of my medical decision-making.   EKG Interpretation None      MDM   Final diagnoses:  Coughing blood  Patient's 50 year old male with history of anemia anemia, also blood transfusions, end-stage renal disease on dialysis. He is presenting today with hemoptysis after using crack cocaine. Patient has been coughing up bright red blood since last night at 1 AM. Patient also missed dialysis yesterday. Patient has raless on exam. He is currently coughing up bright red blood occasionally. Since arrival here, less than 10 mL.  Patient could be fluid overloaded from  missing dialysis yesterday. Also Rales and cough could be due to hemoptysis. Believe the hemoptysis is likely due to a substrate in crack cocaine and he smokes versus bronchopulmonary irritation from smoking crack cocaine.  Unfortunately patient has multiple antibody issues and is not able to receive blood.  Currently hemoglobin is pending. We touched base with calluses so they're aware. He is here. I think likely is Rales are secondary to crack cocaine use.  We'll get CT chest and then consider admission to stepdown with critical care aware of patient in case the need of bronchoscopy emerges.  11:15 AM Labs show hyperK.  Will treat according. Renal aware. Talked to crit care before CT back and they thought they were unlikely to bronch given bleeding from crack cocaine  12:11 PM patietn going for emergent dialysis. Will admit to internal medicine.   12:19 PM Discussed with Dr. Annamaria Boots on pulmonology. He is aware of patient, his hemoptysis.  Also told him about patient's difficult in receiving blood given particular antibody status. He will follow up with patient.  Recs to keep him quiet, monitor.   CRITICAL CARE Performed by: Gardiner Sleeper Total critical care time:  60 minutes Critical care time was exclusive of separately billable procedures and treating other patients. Critical care was necessary to treat or prevent imminent or life-threatening deterioration. Critical care was time spent personally by me on the following activities: development of treatment plan with patient and/or surrogate as well as nursing, discussions with consultants, evaluation of patient's response to treatment, examination of patient, obtaining history from patient or surrogate, ordering and performing treatments and interventions, ordering and review of laboratory studies, ordering and review of radiographic studies, pulse oximetry and re-evaluation of patient's condition.   Denyla Cortese Julio Alm,  MD 07/25/15 1220

## 2015-07-26 DIAGNOSIS — Z992 Dependence on renal dialysis: Secondary | ICD-10-CM

## 2015-07-26 DIAGNOSIS — F14288 Cocaine dependence with other cocaine-induced disorder: Secondary | ICD-10-CM

## 2015-07-26 DIAGNOSIS — F17210 Nicotine dependence, cigarettes, uncomplicated: Secondary | ICD-10-CM

## 2015-07-26 DIAGNOSIS — R042 Hemoptysis: Principal | ICD-10-CM

## 2015-07-26 DIAGNOSIS — I120 Hypertensive chronic kidney disease with stage 5 chronic kidney disease or end stage renal disease: Secondary | ICD-10-CM

## 2015-07-26 DIAGNOSIS — F17290 Nicotine dependence, other tobacco product, uncomplicated: Secondary | ICD-10-CM

## 2015-07-26 DIAGNOSIS — Z9115 Patient's noncompliance with renal dialysis: Secondary | ICD-10-CM

## 2015-07-26 DIAGNOSIS — N186 End stage renal disease: Secondary | ICD-10-CM

## 2015-07-26 DIAGNOSIS — I776 Arteritis, unspecified: Secondary | ICD-10-CM | POA: Insufficient documentation

## 2015-07-26 DIAGNOSIS — R0989 Other specified symptoms and signs involving the circulatory and respiratory systems: Secondary | ICD-10-CM | POA: Insufficient documentation

## 2015-07-26 DIAGNOSIS — J449 Chronic obstructive pulmonary disease, unspecified: Secondary | ICD-10-CM

## 2015-07-26 LAB — MRSA PCR SCREENING: MRSA by PCR: NEGATIVE

## 2015-07-26 LAB — RENAL FUNCTION PANEL
ALBUMIN: 4.5 g/dL (ref 3.5–5.0)
Anion gap: 18 — ABNORMAL HIGH (ref 5–15)
BUN: 73 mg/dL — ABNORMAL HIGH (ref 6–20)
CHLORIDE: 93 mmol/L — AB (ref 101–111)
CO2: 27 mmol/L (ref 22–32)
CREATININE: 10.39 mg/dL — AB (ref 0.61–1.24)
Calcium: 10.2 mg/dL (ref 8.9–10.3)
GFR calc non Af Amer: 5 mL/min — ABNORMAL LOW (ref >60)
GFR, EST AFRICAN AMERICAN: 6 mL/min — AB (ref >60)
Glucose, Bld: 68 mg/dL (ref 65–99)
Phosphorus: 7.4 mg/dL — ABNORMAL HIGH (ref 2.5–4.6)
Potassium: 5.7 mmol/L — ABNORMAL HIGH (ref 3.5–5.1)
Sodium: 138 mmol/L (ref 135–145)

## 2015-07-26 LAB — CBC
HCT: 40.7 % (ref 39.0–52.0)
Hemoglobin: 13 g/dL (ref 13.0–17.0)
MCH: 31.3 pg (ref 26.0–34.0)
MCHC: 31.9 g/dL (ref 30.0–36.0)
MCV: 98.1 fL (ref 78.0–100.0)
PLATELETS: 99 10*3/uL — AB (ref 150–400)
RBC: 4.15 MIL/uL — AB (ref 4.22–5.81)
RDW: 14.4 % (ref 11.5–15.5)
WBC: 6 10*3/uL (ref 4.0–10.5)

## 2015-07-26 LAB — HIV ANTIBODY (ROUTINE TESTING W REFLEX): HIV SCREEN 4TH GENERATION: NONREACTIVE

## 2015-07-26 MED ORDER — ACETAMINOPHEN 500 MG PO TABS
1000.0000 mg | ORAL_TABLET | Freq: Once | ORAL | Status: AC
  Administered 2015-07-26: 1000 mg via ORAL
  Filled 2015-07-26: qty 2

## 2015-07-26 MED ORDER — CLONIDINE HCL 0.2 MG PO TABS
0.2000 mg | ORAL_TABLET | Freq: Two times a day (BID) | ORAL | Status: DC
  Administered 2015-07-26: 0.2 mg via ORAL
  Filled 2015-07-26: qty 1

## 2015-07-26 MED ORDER — SODIUM POLYSTYRENE SULFONATE 15 GM/60ML PO SUSP
45.0000 g | Freq: Once | ORAL | Status: AC
  Administered 2015-07-26: 45 g via ORAL
  Filled 2015-07-26: qty 180

## 2015-07-26 MED ORDER — AMLODIPINE BESYLATE 10 MG PO TABS
10.0000 mg | ORAL_TABLET | Freq: Every day | ORAL | Status: DC
  Administered 2015-07-26: 10 mg via ORAL
  Filled 2015-07-26: qty 1

## 2015-07-26 MED ORDER — CARVEDILOL 25 MG PO TABS
37.5000 mg | ORAL_TABLET | Freq: Two times a day (BID) | ORAL | Status: DC
  Administered 2015-07-26 (×2): 37.5 mg via ORAL
  Filled 2015-07-26 (×2): qty 1

## 2015-07-26 MED ORDER — ACETAMINOPHEN 325 MG PO TABS
650.0000 mg | ORAL_TABLET | Freq: Four times a day (QID) | ORAL | Status: DC | PRN
  Administered 2015-07-26: 650 mg via ORAL
  Filled 2015-07-26: qty 2

## 2015-07-26 NOTE — Progress Notes (Signed)
Subjective: Much improved condition. His only complaint this morning is headache that is partially improved with tylenol. He has not had any more hemoptysis since hemodialysis yesterday. No chest pain or shortness of breath.  Objective: Vital signs in last 24 hours: Filed Vitals:   07/25/15 2003 07/26/15 0531 07/26/15 0907 07/26/15 1021  BP: 193/116 180/128 182/120 156/110  Pulse: 102 91 97 83  Temp: 98.4 F (36.9 C) 98 F (36.7 C) 98.2 F (36.8 C)   TempSrc: Oral Oral Oral   Resp: 17 18 18    Weight: 70.5 kg (155 lb 6.8 oz)     SpO2: 92% 96% 98%    Weight change:   Intake/Output Summary (Last 24 hours) at 07/26/15 1104 Last data filed at 07/26/15 0900  Gross per 24 hour  Intake    480 ml  Output   4007 ml  Net  -3527 ml   GENERAL- chronically ill appearing, thin man, in NAD HEENT- Oral mucosa appears moist, no JVD CARDIAC- RRR, no murmurs, rubs or gallops. RESP- Faint coarse breath sounds in all fields, good air movement ABDOMEN- Soft, nontender, no guarding or rebound, EXTREMITIES- symmetric, no pedal edema, LUE forearm AVF with palpable thrill  Lab Results: Basic Metabolic Panel:  Recent Labs Lab 07/25/15 1757 07/26/15 0541  NA 136 138  K 4.0 5.7*  CL 93* 93*  CO2 28 27  GLUCOSE 169* 68  BUN 54* 73*  CREATININE 8.87* 10.39*  CALCIUM 9.8 10.2  PHOS 3.9 7.4*   Liver Function Tests:  Recent Labs Lab 07/25/15 1016  07/25/15 1757 07/26/15 0541  AST 13*  --   --   --   ALT 18  --   --   --   ALKPHOS 111  --   --   --   BILITOT 0.6  --   --   --   PROT 6.6  --   --   --   ALBUMIN 3.9  < > 4.3 4.5  < > = values in this interval not displayed. CBC:  Recent Labs Lab 07/25/15 1016 07/25/15 1258 07/26/15 0541  WBC 6.1 7.1 6.0  NEUTROABS 4.3  --   --   HGB 12.0* 12.2* 13.0  HCT 36.0* 36.5* 40.7  MCV 97.8 97.3 98.1  PLT 94* 86* 99*    Micro Results: Recent Results (from the past 240 hour(s))  MRSA PCR Screening     Status: None   Collection  Time: 07/25/15 10:42 PM  Result Value Ref Range Status   MRSA by PCR NEGATIVE NEGATIVE Final    Comment:        The GeneXpert MRSA Assay (FDA approved for NASAL specimens only), is one component of a comprehensive MRSA colonization surveillance program. It is not intended to diagnose MRSA infection nor to guide or monitor treatment for MRSA infections.    Studies/Results: Ct Chest W Contrast  07/25/2015  CLINICAL DATA:  50 year old male with history of hemoptysis after smoking crack and missing dialysis appointment. EXAM: CT CHEST WITH CONTRAST TECHNIQUE: Multidetector CT imaging of the chest was performed during intravenous contrast administration. CONTRAST:  169mL OMNIPAQUE IOHEXOL 300 MG/ML  SOLN COMPARISON:  Chest CT 11/10/2011. FINDINGS: Mediastinum/Lymph Nodes: Heart size is enlarged with left atrial dilatation. There is no significant pericardial fluid, thickening or pericardial calcification. There is atherosclerosis of the thoracic aorta, the great vessels of the mediastinum and the coronary arteries, including calcified atherosclerotic plaque in the distal left main and proximal left anterior descending coronary  arteries. Multiple mildly enlarged and borderline enlarged mediastinal and bilateral hilar lymph nodes, largest of which measures up to 1.4 cm in the right hilar region, many of which are densely calcified. Esophagus is unremarkable in appearance. No axillary lymphadenopathy. Lungs/Pleura: There is a background of diffuse centrilobular ground-glass attenuation micronodularity throughout all aspects of the lungs bilaterally, presumably a smoking related disease such as respiratory bronchiolitis or respiratory bronchiolitis interstitial lung disease. Scattered small calcified granulomas are also noted. In addition, there is extensive peribronchovascular ground-glass attenuation which is predominantly in the right lower lobe, and to a lesser degree in the left lower lobe. Given the  patient's history of hemoptysis, findings are concerning for source of hemoptysis in the right lower lobe (favored to be in the superior segment), with some endobronchial spread of hemorrhage to other areas of the lungs. Very mild paraseptal emphysema. No pleural effusions. Upper Abdomen: Severe bilateral renal atrophy in the visualized portions of the abdomen. Musculoskeletal/Soft Tissues: There are no aggressive appearing lytic or blastic lesions noted in the visualized portions of the skeleton. IMPRESSION: 1. Given the patient's history of hemoptysis, findings are concerning for source of hemoptysis in the right lower lobe, likely in the superior segment, with some endobronchial spread of hemorrhage 2 other areas of the lungs, predominantly in the basal segments of the right lower lobe and left lower lobe. 2. Stigmata of smoking related disease such as respiratory bronchiolitis (RB), or if the patient is symptomatic, respiratory bronchiolitis interstitial lung disease (RB-ILD). 3. Cardiomegaly with left atrial dilatation. 4. Atherosclerosis, including left main and left anterior descending coronary artery disease. Please note that although the presence of coronary artery calcium documents the presence of coronary artery disease, the severity of this disease and any potential stenosis cannot be assessed on this non-gated CT examination. Assessment for potential risk factor modification, dietary therapy or pharmacologic therapy may be warranted, if clinically indicated. 28. Stigmata of old granulomatous disease redemonstrated. Electronically Signed   By: Vinnie Langton M.D.   On: 07/25/2015 11:55   Dg Chest Portable 1 View  07/25/2015  CLINICAL DATA:  50 year old male with a history of shortness of breath. A hemoptysis. EXAM: PORTABLE CHEST 1 VIEW COMPARISON:  01/24/2014, 01/22/2014, CT 11/10/2011 FINDINGS: Cardiomediastinal silhouette unchanged, with cardiomegaly. No pneumothorax or pleural effusion. Bronchial  wall thickening on the right, more pronounced at the right medial base than on the comparison. Micro nodular appearance of the bilateral lungs, similar to comparison plain film. IMPRESSION: Bronchial wall thickening with ill-defined interstitial opacities at the right medial base, more pronounced than the comparison. Nonspecific, potentially chronic or acute on chronic changes, including that related to bronchitis or aspiration pneumonitis. Micro nodular appearance of the bilateral lungs, which was present on prior plain film and CT dating to 11/10/2011. In the absence of known malignancy, this may represent chronic changes of the lymphatics, potentially related to prior episodes of CHF, partially calcified granulomas, or potentially a manifestation of sarcoid. Correlation with patient history may be useful. Signed, Dulcy Fanny. Earleen Newport, DO [facility] Electronically Signed   By: Corrie Mckusick D.O.   On: 07/25/2015 10:45   Medications: I have reviewed the patient's current medications. Scheduled Meds: . amLODipine  10 mg Oral Daily  . aspirin  81 mg Oral Daily  . carvedilol  37.5 mg Oral BID WC  . cinacalcet  60 mg Oral Q supper  . cinacalcet  90 mg Oral QAC breakfast  . cloNIDine  0.2 mg Oral  BID  . dextrose  1 ampule Intravenous Once  . insulin aspart  10 Units Intravenous Once  . levETIRAcetam  1,000 mg Oral BID  . losartan  100 mg Oral QHS  . multivitamin  1 tablet Oral QHS  . pantoprazole  20 mg Oral Daily  . sevelamer carbonate  1,600 mg Oral TID WC  . sodium polystyrene  45 g Oral Once   Continuous Infusions:  PRN Meds:.calcium carbonate, diphenhydrAMINE, feeding supplement (NEPRO CARB STEADY), hydrOXYzine Assessment/Plan: Hemopytsis: Discussed his case briefly with pulmonology service. Given the resolution of his symptoms and clear history of provocation by cocaine use there is not a strong indication for further studies or  procedures at this time. He will need follow up imaging for resolution of his hemoptysis and infiltrate to be worked up with pulmonology if not cleared in 2 weeks. Patient again counseled on link between his extensive cocaine use and these symptoms.  ESRD on HD MWF: HD with resolution of metabolic abnormalities. SOB resolved and chest pain improved. Hyperkalemia this morning to 5.7. We will plan to manage with medication and let him resume HD on normal schedule Monday. -Kayexalate 45g -MBD management per Nephrology  Cocaine abuse: Very longstanding problem, has undergone numerous rehabilitation and detox programs. Might benefit if he could detox enough to be considered in a pain program. Seems precontemplative at this point. -Consult to social work  HTN: High and remaining high after HD. On multiple medications at home. Cocaine use and med noncompliance. -Amlodipine 10mg  -Coreg 37.5mg  BIDWM -Clonidine 0.2mg  BID -Losartan 100mg  qhs  COPD: No more dyspnea after HD. Does not appear to be on/using inhaler therapies. Chronic 1ppd smoker in addition to daily crack cocaine user.  Diet: Renal VTE ppx: [place] heparin FULL CODE  Dispo: Possibly discharge to home this afternoon if continues in good clinical condition.  The patient does have a current PCP Leamon Arnt, MD) and does need an [facility] hospital follow-up appointment after discharge.  The patient does not have transportation limitations that hinder transportation to clinic appointments.   LOS: 1 day   Collier Salina, MD 07/26/2015, 11:04 AM

## 2015-07-26 NOTE — Progress Notes (Signed)
Patient blood pressure 180/124. MD notified. Awaiting orders. RN will continue to monitor patient.   Ermalinda Memos, RN

## 2015-07-26 NOTE — Discharge Summary (Signed)
Name: Jeremy Mccoy MRN: [id number] DOB: Jan 02, 1966 50 y.o. PCP: Leamon Arnt, MD  Date of Admission: 07/25/2015  9:33 AM Date of Discharge: 07/26/2015 Attending Physician: Dr. Rhina Brackett Dam  Discharge Diagnosis:   Hemoptysis   Vasculitis ([place])   Hyperkalemia   Chronic cocaine abuse  Discharge Medications:   Medication List    TAKE these medications        acetaminophen 500 MG tablet  Commonly known as:  TYLENOL  Take 1,000 mg by mouth daily at 6 (six) AM. 6 times a day, 500 mg, 12 to 24 tabs a day, 6,000 to 12,000 mg daily for pain     amLODipine 10 MG tablet  Commonly known as:  NORVASC  Take 1 tablet (10 mg total) by mouth daily.     aspirin 81 MG chewable tablet  Chew 1 tablet (81 mg total) by mouth daily.     calcium carbonate 500 MG chewable tablet  Commonly known as:  TUMS - dosed in mg elemental calcium  Chew 500 mg by mouth daily as needed for indigestion or heartburn.     carvedilol 25 MG tablet  Commonly known as:  COREG  Take 1.5 tablets (37.5 mg total) by mouth 2 (two) times daily with a meal. On Monday-Wednesday-Friday-Sunday (non-HD days); on dialysis days (Tuesday-Thursday-Saturday) take it once a day (evening time only).     cloNIDine 0.2 MG tablet  Commonly known as:  CATAPRES  Take 1 tablet (0.2 mg total) by mouth 2 (two) times daily.     diphenhydrAMINE 25 MG tablet  Commonly known as:  BENADRYL  Take 25 mg by mouth every 6 (six) hours as needed for itching.     feeding supplement (NEPRO CARB STEADY) Liqd  Take 237 mLs by mouth 3 (three) times daily as needed (Supplement).     hydrOXYzine 25 MG tablet  Commonly known as:  ATARAX/VISTARIL  Take 25 mg by mouth 3 (three) times daily as needed for itching.     levETIRAcetam 1000 MG tablet  Commonly known as:  KEPPRA  Take 1 tablet (1,000 mg total) by mouth 2 (two) times daily.     losartan 100 MG tablet  Commonly known as:  COZAAR  Take 100 mg by mouth at bedtime.     multivitamin Tabs tablet  Take 1 tablet by mouth at bedtime.     pantoprazole 40 MG tablet  Commonly known as:  PROTONIX  Take 1 tablet (40 mg total) by mouth daily.     SENSIPAR 30 MG tablet  Generic drug:  cinacalcet  Take 60 mg by mouth daily.     sevelamer carbonate 800 MG tablet  Commonly known as:  RENVELA  Take 2 tablets (1,600 mg total) by mouth 3 (three) times daily with meals.        Disposition and follow-up:   Jeremy Mccoy was discharged from [facility] in Good condition.  At the hospital follow up visit please address:  1. Hemoptysis: Precipitated by crack cocaine use, possibly vasculitis 2/2 cocaine additive. Resolved after HD. Per pulmonology should get a follow up chest xray and outpatient consultation if not resolved.  2. Hyperkalemia: From missing hemodialysis, needs dialysis.  3. Labs / imaging needed at time of follow-up: Repeat chest xray in 2 weeks to check for clearance of hemoptysis.   Follow-up Appointments: Follow-up Information    Follow up with ANDY,CAMILLE L, MD. Schedule an appointment as soon as possible for a visit  on 08/10/2015.   Specialty:  Family Medicine   Why:  For xray and follow up   Contact information:   [address] [place] [address] [phone]       Discharge Instructions:   Consultations: Treatment Team:  Roney Jaffe, MD  Procedures Performed:  Ct Chest W Contrast  07/25/2015  CLINICAL DATA:  50 year old male with history of hemoptysis after smoking crack and missing dialysis appointment. EXAM: CT CHEST WITH CONTRAST TECHNIQUE: Multidetector CT imaging of the chest was performed during intravenous contrast administration. CONTRAST:  137mL OMNIPAQUE IOHEXOL 300 MG/ML  SOLN COMPARISON:  Chest CT 11/10/2011. FINDINGS: Mediastinum/Lymph Nodes: Heart size is enlarged with left atrial dilatation. There is no significant pericardial fluid, thickening or pericardial calcification.  There is atherosclerosis of the thoracic aorta, the great vessels of the mediastinum and the coronary arteries, including calcified atherosclerotic plaque in the distal left main and proximal left anterior descending coronary arteries. Multiple mildly enlarged and borderline enlarged mediastinal and bilateral hilar lymph nodes, largest of which measures up to 1.4 cm in the right hilar region, many of which are densely calcified. Esophagus is unremarkable in appearance. No axillary lymphadenopathy. Lungs/Pleura: There is a background of diffuse centrilobular ground-glass attenuation micronodularity throughout all aspects of the lungs bilaterally, presumably a smoking related disease such as respiratory bronchiolitis or respiratory bronchiolitis interstitial lung disease. Scattered small calcified granulomas are also noted. In addition, there is extensive peribronchovascular ground-glass attenuation which is predominantly in the right lower lobe, and to a lesser degree in the left lower lobe. Given the patient's history of hemoptysis, findings are concerning for source of hemoptysis in the right lower lobe (favored to be in the superior segment), with some endobronchial spread of hemorrhage to other areas of the lungs. Very mild paraseptal emphysema. No pleural effusions. Upper Abdomen: Severe bilateral renal atrophy in the visualized portions of the abdomen. Musculoskeletal/Soft Tissues: There are no aggressive appearing lytic or blastic lesions noted in the visualized portions of the skeleton. IMPRESSION: 1. Given the patient's history of hemoptysis, findings are concerning for source of hemoptysis in the right lower lobe, likely in the superior segment, with some endobronchial spread of hemorrhage 2 other areas of the lungs, predominantly in the basal segments of the right lower lobe and left lower lobe. 2. Stigmata of smoking related disease such as respiratory bronchiolitis (RB), or if the patient is symptomatic,  respiratory bronchiolitis interstitial lung disease (RB-ILD). 3. Cardiomegaly with left atrial dilatation. 4. Atherosclerosis, including left main and left anterior descending coronary artery disease. Please note that although the presence of coronary artery calcium documents the presence of coronary artery disease, the severity of this disease and any potential stenosis cannot be assessed on this non-gated CT examination. Assessment for potential risk factor modification, dietary therapy or pharmacologic therapy may be warranted, if clinically indicated. 12. Stigmata of old granulomatous disease redemonstrated. Electronically Signed   By: Vinnie Langton M.D.   On: 07/25/2015 11:55   Dg Chest Portable 1 View  07/25/2015  CLINICAL DATA:  50 year old male with a history of shortness of breath. A hemoptysis. EXAM: PORTABLE CHEST 1 VIEW COMPARISON:  01/24/2014, 01/22/2014, CT 11/10/2011 FINDINGS: Cardiomediastinal silhouette unchanged, with cardiomegaly. No pneumothorax or pleural effusion. Bronchial wall thickening on the right, more pronounced at the right medial base than on the comparison. Micro nodular appearance of the bilateral lungs, similar to comparison plain film. IMPRESSION: Bronchial wall thickening with ill-defined interstitial opacities at the right medial base, more pronounced  than the comparison. Nonspecific, potentially chronic or acute on chronic changes, including that related to bronchitis or aspiration pneumonitis. Micro nodular appearance of the bilateral lungs, which was present on prior plain film and CT dating to 11/10/2011. In the absence of known malignancy, this may represent chronic changes of the lymphatics, potentially related to prior episodes of CHF, partially calcified granulomas, or potentially a manifestation of sarcoid. Correlation with patient history may be useful. Signed, Dulcy Fanny. Earleen Newport, DO [facility] Radiology Electronically  Signed   By: Corrie Mckusick D.O.   On: 07/25/2015 10:45   Admission HPI: 50 y/o white man with PMHx of COPD, hypertension, ESRD on HD MWF, cocaine abuse presented to ED with with hemoptysis and shortness of breath. The patient reports missing his last HD session and daily crack cocaine use, last at 11pm Friday night. At around 1am he started coughing scant bright red blood. This continued intermittently until approximately 8am this morning when he started to feel crackles in his breathing and becoming short of breath. He denies abdominal pain, nausea, vomiting. He denies any weakness or dizziness. He did have mild chest pain associated with his cough.  After arrival to the ED he was observed to continue coughing scant bright red blood. CT chest obtained showing likely endobronchial hemorrhage in RLL as well as evidence of bronchiolitis and old granulomatous disease. Potassium of >7.5 without acute EKG changes and patient taken for emergent hemodialysis.  Hospital Course by problem list: Hemoptysis: Patient admitted for emergent hemodialysis during which he continued scant hemoptysis but fully resolved the same evening. Slept overnight with some headache otherwise no distress. Patient was hyperkalemic again to 5.7 the next day with no changes on cardiac telemetry and received 45g kayexalate with 4 stools passed prior to discharge in good condition for f/u at outptient dialysis center the next day 3/13.  Chronic cocaine abuse: Reports years of use with daily use for past several months. Patient was again counseled on relationship of crack cocaine abuse and his ESRD, chronic headaches, and his presenting hemoptysis. Expressed understanding but seemed precontemplative of cessation at this time.  Discharge Vitals:   BP 167/120 mmHg  Pulse 82  Temp(Src) 98.4 F (36.9 C) (Oral)  Resp 18  Wt 70.5 kg (155 lb 6.8 oz)  SpO2 99%  Discharge Labs:  No results found for this or any previous visit (from the past  24 hour(s)).  Signed: Collier Salina, MD 07/28/2015, 2:07 PM

## 2015-07-26 NOTE — Progress Notes (Signed)
[facility] Progress Note   Subjective: stable, feels "much better", no hemoptysis this am  Filed Vitals:   07/25/15 2003 07/26/15 0531 07/26/15 0907 07/26/15 1021  BP: 193/116 180/128 182/120 156/110  Pulse: 102 91 97 83  Temp: 98.4 F (36.9 C) 98 F (36.7 C) 98.2 F (36.8 C)   TempSrc: Oral Oral Oral   Resp: 17 18 18    Weight: 70.5 kg (155 lb 6.8 oz)     SpO2: 92% 96% 98%     Inpatient medications: . amLODipine  10 mg Oral Daily  . aspirin  81 mg Oral Daily  . carvedilol  37.5 mg Oral BID WC  . cinacalcet  60 mg Oral Q supper  . cinacalcet  90 mg Oral QAC breakfast  . cloNIDine  0.2 mg Oral BID  . dextrose  1 ampule Intravenous Once  . insulin aspart  10 Units Intravenous Once  . levETIRAcetam  1,000 mg Oral BID  . losartan  100 mg Oral QHS  . multivitamin  1 tablet Oral QHS  . pantoprazole  20 mg Oral Daily  . sevelamer carbonate  1,600 mg Oral TID WC  . sodium polystyrene  45 g Oral Once     calcium carbonate, diphenhydrAMINE, feeding supplement (NEPRO CARB STEADY), hydrOXYzine  Exam: Alert, occ cough No distress No jvd Chest some crackles R base, o/w clear RRR no mrg aBd soft ntnd no mass or ascites No LE or UE edema LFA AVF +bruit  HD MWF Adm Fm EDW 70 kg HD Bath 2k, 2.25ca Time 4 hr 58min Heparin none. Access L FA AVF  Hectorol 100mcg mcg IV/HD Units IV/HD Venofer 50mg  weekly  Other op Labs hgb 12.9 ca 9.0 phos 6.2 pth 1030  Assessment/Plan 1. Hyperkalemia - better, dropped < 5 then 5.7 this am.   2. ESRD - MWF HD 3. Hemoptysis- no intervention planned as of now 4. Hypertension/volume - norvasc, coreg, clonidine, losartin. At dry wt 5. Anemia - no esa with HGB 12 / weekly fe on hd  6. Metabolic bone disease - Iv Hectorol with pth >1,000 / phos binder=,renvela/ Tums / q day Sensipar  7. Cocaine / substance abuse - major issue 8. Dispo - ok for dc from renal standpoint. Will have HD tomorrow at OP center. Give  kayexalate x 1 today prior to dc.  D/w primary.    Kelly Splinter MD [facility] pager [phone]    cell [phone] 07/26/2015, 10:51 AM    Recent Labs Lab 07/25/15 1257 07/25/15 1757 07/26/15 0541  NA 131* 136 138  K >7.5* 4.0 5.7*  CL 89* 93* 93*  CO2 22 28 27   GLUCOSE 87 169* 68  BUN 122* 54* 73*  CREATININE 15.10* 8.87* 10.39*  CALCIUM 8.7* 9.8 10.2  PHOS 6.3* 3.9 7.4*    Recent Labs Lab 07/25/15 1016 07/25/15 1257 07/25/15 1757 07/26/15 0541  AST 13*  --   --   --   ALT 18  --   --   --   ALKPHOS 111  --   --   --   BILITOT 0.6  --   --   --   PROT 6.6  --   --   --   ALBUMIN 3.9 4.0 4.3 4.5    Recent Labs Lab 07/25/15 1016 07/25/15 1258 07/26/15 0541  WBC 6.1 7.1 6.0  NEUTROABS 4.3  --   --   HGB 12.0* 12.2* 13.0  HCT 36.0* 36.5* 40.7  MCV  97.8 97.3 98.1  PLT 94* 86* 99*

## 2015-07-26 NOTE — Progress Notes (Signed)
Pt BP 182/120. Pt given scheduled meds. Recheck BP 156/110. Dr. Benjamine Mola paged to make aware. Pt not in acute distress. Will continue to assess and monitor pt.   Carole Civil, RN

## 2015-07-26 NOTE — Discharge Instructions (Addendum)
You were admitted for hemoptysis (coughing up blood) related to cocaine use. This is improved and no additional treatments are required at this time but you should follow up with your regular doctor in 2 weeks for a repeat xray to see if this has cleared or if there is continued bleeding, clotting, or scarring of the lungs that may need more treatment.  As discussed, decreasing cocaine use would decrease the likelihood of these symptoms coming back.

## 2015-07-27 DIAGNOSIS — D631 Anemia in chronic kidney disease: Secondary | ICD-10-CM | POA: Diagnosis not present

## 2015-07-27 DIAGNOSIS — N2581 Secondary hyperparathyroidism of renal origin: Secondary | ICD-10-CM | POA: Diagnosis not present

## 2015-07-27 DIAGNOSIS — D509 Iron deficiency anemia, unspecified: Secondary | ICD-10-CM | POA: Diagnosis not present

## 2015-07-27 DIAGNOSIS — N186 End stage renal disease: Secondary | ICD-10-CM | POA: Diagnosis not present

## 2015-07-27 LAB — TYPE AND SCREEN
ABO/RH(D): O POS
Antibody Screen: POSITIVE
DAT, IgG: NEGATIVE
UNIT DIVISION: 0
Unit division: 0

## 2015-07-27 LAB — POCT I-STAT, CHEM 8
BUN: 42 mg/dL — AB (ref 6–20)
CREATININE: 6.4 mg/dL — AB (ref 0.61–1.24)
Calcium, Ion: 1.09 mmol/L — ABNORMAL LOW (ref 1.12–1.23)
Chloride: 93 mmol/L — ABNORMAL LOW (ref 101–111)
Glucose, Bld: 84 mg/dL (ref 65–99)
HEMATOCRIT: 44 % (ref 39.0–52.0)
HEMOGLOBIN: 15 g/dL (ref 13.0–17.0)
POTASSIUM: 3.8 mmol/L (ref 3.5–5.1)
SODIUM: 136 mmol/L (ref 135–145)
TCO2: 26 mmol/L (ref 0–100)

## 2015-07-28 LAB — QUANTIFERON IN TUBE
QFT TB AG MINUS NIL VALUE: 0.02 IU/mL
QUANTIFERON MITOGEN VALUE: 9.34 IU/mL
QUANTIFERON NIL VALUE: 0.04 [IU]/mL
QUANTIFERON TB AG VALUE: 0.06 [IU]/mL
QUANTIFERON TB GOLD: NEGATIVE

## 2015-07-28 LAB — QUANTIFERON TB GOLD ASSAY (BLOOD)

## 2015-07-29 DIAGNOSIS — D631 Anemia in chronic kidney disease: Secondary | ICD-10-CM | POA: Diagnosis not present

## 2015-07-29 DIAGNOSIS — N186 End stage renal disease: Secondary | ICD-10-CM | POA: Diagnosis not present

## 2015-07-29 DIAGNOSIS — N2581 Secondary hyperparathyroidism of renal origin: Secondary | ICD-10-CM | POA: Diagnosis not present

## 2015-07-29 DIAGNOSIS — D509 Iron deficiency anemia, unspecified: Secondary | ICD-10-CM | POA: Diagnosis not present

## 2015-07-31 DIAGNOSIS — D631 Anemia in chronic kidney disease: Secondary | ICD-10-CM | POA: Diagnosis not present

## 2015-07-31 DIAGNOSIS — D509 Iron deficiency anemia, unspecified: Secondary | ICD-10-CM | POA: Diagnosis not present

## 2015-07-31 DIAGNOSIS — N2581 Secondary hyperparathyroidism of renal origin: Secondary | ICD-10-CM | POA: Diagnosis not present

## 2015-07-31 DIAGNOSIS — N186 End stage renal disease: Secondary | ICD-10-CM | POA: Diagnosis not present

## 2015-08-03 DIAGNOSIS — N186 End stage renal disease: Secondary | ICD-10-CM | POA: Diagnosis not present

## 2015-08-03 DIAGNOSIS — D509 Iron deficiency anemia, unspecified: Secondary | ICD-10-CM | POA: Diagnosis not present

## 2015-08-03 DIAGNOSIS — D631 Anemia in chronic kidney disease: Secondary | ICD-10-CM | POA: Diagnosis not present

## 2015-08-03 DIAGNOSIS — N2581 Secondary hyperparathyroidism of renal origin: Secondary | ICD-10-CM | POA: Diagnosis not present

## 2015-08-05 DIAGNOSIS — N2581 Secondary hyperparathyroidism of renal origin: Secondary | ICD-10-CM | POA: Diagnosis not present

## 2015-08-05 DIAGNOSIS — D509 Iron deficiency anemia, unspecified: Secondary | ICD-10-CM | POA: Diagnosis not present

## 2015-08-05 DIAGNOSIS — D631 Anemia in chronic kidney disease: Secondary | ICD-10-CM | POA: Diagnosis not present

## 2015-08-05 DIAGNOSIS — N186 End stage renal disease: Secondary | ICD-10-CM | POA: Diagnosis not present

## 2015-08-07 DIAGNOSIS — D631 Anemia in chronic kidney disease: Secondary | ICD-10-CM | POA: Diagnosis not present

## 2015-08-07 DIAGNOSIS — N186 End stage renal disease: Secondary | ICD-10-CM | POA: Diagnosis not present

## 2015-08-07 DIAGNOSIS — N2581 Secondary hyperparathyroidism of renal origin: Secondary | ICD-10-CM | POA: Diagnosis not present

## 2015-08-07 DIAGNOSIS — D509 Iron deficiency anemia, unspecified: Secondary | ICD-10-CM | POA: Diagnosis not present

## 2015-08-10 DIAGNOSIS — N2581 Secondary hyperparathyroidism of renal origin: Secondary | ICD-10-CM | POA: Diagnosis not present

## 2015-08-10 DIAGNOSIS — D631 Anemia in chronic kidney disease: Secondary | ICD-10-CM | POA: Diagnosis not present

## 2015-08-10 DIAGNOSIS — N186 End stage renal disease: Secondary | ICD-10-CM | POA: Diagnosis not present

## 2015-08-10 DIAGNOSIS — D509 Iron deficiency anemia, unspecified: Secondary | ICD-10-CM | POA: Diagnosis not present

## 2015-08-12 DIAGNOSIS — N2581 Secondary hyperparathyroidism of renal origin: Secondary | ICD-10-CM | POA: Diagnosis not present

## 2015-08-12 DIAGNOSIS — D631 Anemia in chronic kidney disease: Secondary | ICD-10-CM | POA: Diagnosis not present

## 2015-08-12 DIAGNOSIS — D509 Iron deficiency anemia, unspecified: Secondary | ICD-10-CM | POA: Diagnosis not present

## 2015-08-12 DIAGNOSIS — N186 End stage renal disease: Secondary | ICD-10-CM | POA: Diagnosis not present

## 2015-08-14 DIAGNOSIS — D631 Anemia in chronic kidney disease: Secondary | ICD-10-CM | POA: Diagnosis not present

## 2015-08-14 DIAGNOSIS — D509 Iron deficiency anemia, unspecified: Secondary | ICD-10-CM | POA: Diagnosis not present

## 2015-08-14 DIAGNOSIS — N2581 Secondary hyperparathyroidism of renal origin: Secondary | ICD-10-CM | POA: Diagnosis not present

## 2015-08-14 DIAGNOSIS — I10 Essential (primary) hypertension: Secondary | ICD-10-CM | POA: Diagnosis not present

## 2015-08-14 DIAGNOSIS — Z992 Dependence on renal dialysis: Secondary | ICD-10-CM | POA: Diagnosis not present

## 2015-08-14 DIAGNOSIS — N186 End stage renal disease: Secondary | ICD-10-CM | POA: Diagnosis not present

## 2015-08-19 DIAGNOSIS — N186 End stage renal disease: Secondary | ICD-10-CM | POA: Diagnosis not present

## 2015-08-19 DIAGNOSIS — N2581 Secondary hyperparathyroidism of renal origin: Secondary | ICD-10-CM | POA: Diagnosis not present

## 2015-08-19 DIAGNOSIS — D631 Anemia in chronic kidney disease: Secondary | ICD-10-CM | POA: Diagnosis not present

## 2015-08-21 DIAGNOSIS — N2581 Secondary hyperparathyroidism of renal origin: Secondary | ICD-10-CM | POA: Diagnosis not present

## 2015-08-21 DIAGNOSIS — D631 Anemia in chronic kidney disease: Secondary | ICD-10-CM | POA: Diagnosis not present

## 2015-08-21 DIAGNOSIS — N186 End stage renal disease: Secondary | ICD-10-CM | POA: Diagnosis not present

## 2015-08-24 DIAGNOSIS — D631 Anemia in chronic kidney disease: Secondary | ICD-10-CM | POA: Diagnosis not present

## 2015-08-24 DIAGNOSIS — N186 End stage renal disease: Secondary | ICD-10-CM | POA: Diagnosis not present

## 2015-08-24 DIAGNOSIS — N2581 Secondary hyperparathyroidism of renal origin: Secondary | ICD-10-CM | POA: Diagnosis not present

## 2015-08-26 DIAGNOSIS — N186 End stage renal disease: Secondary | ICD-10-CM | POA: Diagnosis not present

## 2015-08-29 DIAGNOSIS — N186 End stage renal disease: Secondary | ICD-10-CM | POA: Diagnosis not present

## 2015-08-29 DIAGNOSIS — D631 Anemia in chronic kidney disease: Secondary | ICD-10-CM | POA: Diagnosis not present

## 2015-08-29 DIAGNOSIS — N2581 Secondary hyperparathyroidism of renal origin: Secondary | ICD-10-CM | POA: Diagnosis not present

## 2015-08-31 DIAGNOSIS — N2581 Secondary hyperparathyroidism of renal origin: Secondary | ICD-10-CM | POA: Diagnosis not present

## 2015-08-31 DIAGNOSIS — N186 End stage renal disease: Secondary | ICD-10-CM | POA: Diagnosis not present

## 2015-08-31 DIAGNOSIS — D631 Anemia in chronic kidney disease: Secondary | ICD-10-CM | POA: Diagnosis not present

## 2015-09-02 DIAGNOSIS — N2581 Secondary hyperparathyroidism of renal origin: Secondary | ICD-10-CM | POA: Diagnosis not present

## 2015-09-02 DIAGNOSIS — N186 End stage renal disease: Secondary | ICD-10-CM | POA: Diagnosis not present

## 2015-09-02 DIAGNOSIS — D631 Anemia in chronic kidney disease: Secondary | ICD-10-CM | POA: Diagnosis not present

## 2015-09-04 DIAGNOSIS — D631 Anemia in chronic kidney disease: Secondary | ICD-10-CM | POA: Diagnosis not present

## 2015-09-04 DIAGNOSIS — N186 End stage renal disease: Secondary | ICD-10-CM | POA: Diagnosis not present

## 2015-09-04 DIAGNOSIS — N2581 Secondary hyperparathyroidism of renal origin: Secondary | ICD-10-CM | POA: Diagnosis not present

## 2015-09-07 DIAGNOSIS — N186 End stage renal disease: Secondary | ICD-10-CM | POA: Diagnosis not present

## 2015-09-07 DIAGNOSIS — D631 Anemia in chronic kidney disease: Secondary | ICD-10-CM | POA: Diagnosis not present

## 2015-09-07 DIAGNOSIS — N2581 Secondary hyperparathyroidism of renal origin: Secondary | ICD-10-CM | POA: Diagnosis not present

## 2015-09-09 DIAGNOSIS — N186 End stage renal disease: Secondary | ICD-10-CM | POA: Diagnosis not present

## 2015-09-09 DIAGNOSIS — D631 Anemia in chronic kidney disease: Secondary | ICD-10-CM | POA: Diagnosis not present

## 2015-09-09 DIAGNOSIS — N2581 Secondary hyperparathyroidism of renal origin: Secondary | ICD-10-CM | POA: Diagnosis not present

## 2015-09-13 DIAGNOSIS — Z992 Dependence on renal dialysis: Secondary | ICD-10-CM | POA: Diagnosis not present

## 2015-09-13 DIAGNOSIS — N186 End stage renal disease: Secondary | ICD-10-CM | POA: Diagnosis not present

## 2015-09-13 DIAGNOSIS — I10 Essential (primary) hypertension: Secondary | ICD-10-CM | POA: Diagnosis not present

## 2015-09-14 DIAGNOSIS — D631 Anemia in chronic kidney disease: Secondary | ICD-10-CM | POA: Diagnosis not present

## 2015-09-14 DIAGNOSIS — D509 Iron deficiency anemia, unspecified: Secondary | ICD-10-CM | POA: Diagnosis not present

## 2015-09-14 DIAGNOSIS — N2581 Secondary hyperparathyroidism of renal origin: Secondary | ICD-10-CM | POA: Diagnosis not present

## 2015-09-14 DIAGNOSIS — N186 End stage renal disease: Secondary | ICD-10-CM | POA: Diagnosis not present

## 2015-09-16 DIAGNOSIS — N186 End stage renal disease: Secondary | ICD-10-CM | POA: Diagnosis not present

## 2015-09-16 DIAGNOSIS — N2581 Secondary hyperparathyroidism of renal origin: Secondary | ICD-10-CM | POA: Diagnosis not present

## 2015-09-16 DIAGNOSIS — D509 Iron deficiency anemia, unspecified: Secondary | ICD-10-CM | POA: Diagnosis not present

## 2015-09-16 DIAGNOSIS — D631 Anemia in chronic kidney disease: Secondary | ICD-10-CM | POA: Diagnosis not present

## 2015-09-18 DIAGNOSIS — N2581 Secondary hyperparathyroidism of renal origin: Secondary | ICD-10-CM | POA: Diagnosis not present

## 2015-09-18 DIAGNOSIS — D509 Iron deficiency anemia, unspecified: Secondary | ICD-10-CM | POA: Diagnosis not present

## 2015-09-18 DIAGNOSIS — D631 Anemia in chronic kidney disease: Secondary | ICD-10-CM | POA: Diagnosis not present

## 2015-09-18 DIAGNOSIS — N186 End stage renal disease: Secondary | ICD-10-CM | POA: Diagnosis not present

## 2015-09-21 DIAGNOSIS — N2581 Secondary hyperparathyroidism of renal origin: Secondary | ICD-10-CM | POA: Diagnosis not present

## 2015-09-21 DIAGNOSIS — D509 Iron deficiency anemia, unspecified: Secondary | ICD-10-CM | POA: Diagnosis not present

## 2015-09-21 DIAGNOSIS — D631 Anemia in chronic kidney disease: Secondary | ICD-10-CM | POA: Diagnosis not present

## 2015-09-21 DIAGNOSIS — N186 End stage renal disease: Secondary | ICD-10-CM | POA: Diagnosis not present

## 2015-09-23 DIAGNOSIS — N2581 Secondary hyperparathyroidism of renal origin: Secondary | ICD-10-CM | POA: Diagnosis not present

## 2015-09-23 DIAGNOSIS — D509 Iron deficiency anemia, unspecified: Secondary | ICD-10-CM | POA: Diagnosis not present

## 2015-09-23 DIAGNOSIS — N186 End stage renal disease: Secondary | ICD-10-CM | POA: Diagnosis not present

## 2015-09-23 DIAGNOSIS — D631 Anemia in chronic kidney disease: Secondary | ICD-10-CM | POA: Diagnosis not present

## 2015-09-25 DIAGNOSIS — D631 Anemia in chronic kidney disease: Secondary | ICD-10-CM | POA: Diagnosis not present

## 2015-09-25 DIAGNOSIS — D509 Iron deficiency anemia, unspecified: Secondary | ICD-10-CM | POA: Diagnosis not present

## 2015-09-25 DIAGNOSIS — N186 End stage renal disease: Secondary | ICD-10-CM | POA: Diagnosis not present

## 2015-09-25 DIAGNOSIS — N2581 Secondary hyperparathyroidism of renal origin: Secondary | ICD-10-CM | POA: Diagnosis not present

## 2015-09-28 DIAGNOSIS — D631 Anemia in chronic kidney disease: Secondary | ICD-10-CM | POA: Diagnosis not present

## 2015-09-28 DIAGNOSIS — D509 Iron deficiency anemia, unspecified: Secondary | ICD-10-CM | POA: Diagnosis not present

## 2015-09-28 DIAGNOSIS — N186 End stage renal disease: Secondary | ICD-10-CM | POA: Diagnosis not present

## 2015-09-28 DIAGNOSIS — N2581 Secondary hyperparathyroidism of renal origin: Secondary | ICD-10-CM | POA: Diagnosis not present

## 2015-09-30 DIAGNOSIS — D509 Iron deficiency anemia, unspecified: Secondary | ICD-10-CM | POA: Diagnosis not present

## 2015-09-30 DIAGNOSIS — D631 Anemia in chronic kidney disease: Secondary | ICD-10-CM | POA: Diagnosis not present

## 2015-09-30 DIAGNOSIS — N186 End stage renal disease: Secondary | ICD-10-CM | POA: Diagnosis not present

## 2015-09-30 DIAGNOSIS — N2581 Secondary hyperparathyroidism of renal origin: Secondary | ICD-10-CM | POA: Diagnosis not present

## 2015-10-02 DIAGNOSIS — D509 Iron deficiency anemia, unspecified: Secondary | ICD-10-CM | POA: Diagnosis not present

## 2015-10-02 DIAGNOSIS — D631 Anemia in chronic kidney disease: Secondary | ICD-10-CM | POA: Diagnosis not present

## 2015-10-02 DIAGNOSIS — N186 End stage renal disease: Secondary | ICD-10-CM | POA: Diagnosis not present

## 2015-10-02 DIAGNOSIS — N2581 Secondary hyperparathyroidism of renal origin: Secondary | ICD-10-CM | POA: Diagnosis not present

## 2015-10-05 DIAGNOSIS — D509 Iron deficiency anemia, unspecified: Secondary | ICD-10-CM | POA: Diagnosis not present

## 2015-10-05 DIAGNOSIS — D631 Anemia in chronic kidney disease: Secondary | ICD-10-CM | POA: Diagnosis not present

## 2015-10-05 DIAGNOSIS — N186 End stage renal disease: Secondary | ICD-10-CM | POA: Diagnosis not present

## 2015-10-05 DIAGNOSIS — N2581 Secondary hyperparathyroidism of renal origin: Secondary | ICD-10-CM | POA: Diagnosis not present

## 2015-10-07 DIAGNOSIS — D509 Iron deficiency anemia, unspecified: Secondary | ICD-10-CM | POA: Diagnosis not present

## 2015-10-07 DIAGNOSIS — D631 Anemia in chronic kidney disease: Secondary | ICD-10-CM | POA: Diagnosis not present

## 2015-10-07 DIAGNOSIS — N2581 Secondary hyperparathyroidism of renal origin: Secondary | ICD-10-CM | POA: Diagnosis not present

## 2015-10-07 DIAGNOSIS — N186 End stage renal disease: Secondary | ICD-10-CM | POA: Diagnosis not present

## 2015-10-09 DIAGNOSIS — N2581 Secondary hyperparathyroidism of renal origin: Secondary | ICD-10-CM | POA: Diagnosis not present

## 2015-10-09 DIAGNOSIS — D631 Anemia in chronic kidney disease: Secondary | ICD-10-CM | POA: Diagnosis not present

## 2015-10-09 DIAGNOSIS — D509 Iron deficiency anemia, unspecified: Secondary | ICD-10-CM | POA: Diagnosis not present

## 2015-10-09 DIAGNOSIS — N186 End stage renal disease: Secondary | ICD-10-CM | POA: Diagnosis not present

## 2015-10-12 DIAGNOSIS — D631 Anemia in chronic kidney disease: Secondary | ICD-10-CM | POA: Diagnosis not present

## 2015-10-12 DIAGNOSIS — D509 Iron deficiency anemia, unspecified: Secondary | ICD-10-CM | POA: Diagnosis not present

## 2015-10-12 DIAGNOSIS — N186 End stage renal disease: Secondary | ICD-10-CM | POA: Diagnosis not present

## 2015-10-12 DIAGNOSIS — N2581 Secondary hyperparathyroidism of renal origin: Secondary | ICD-10-CM | POA: Diagnosis not present

## 2015-10-14 DIAGNOSIS — I10 Essential (primary) hypertension: Secondary | ICD-10-CM | POA: Diagnosis not present

## 2015-10-14 DIAGNOSIS — D509 Iron deficiency anemia, unspecified: Secondary | ICD-10-CM | POA: Diagnosis not present

## 2015-10-14 DIAGNOSIS — Z992 Dependence on renal dialysis: Secondary | ICD-10-CM | POA: Diagnosis not present

## 2015-10-14 DIAGNOSIS — N2581 Secondary hyperparathyroidism of renal origin: Secondary | ICD-10-CM | POA: Diagnosis not present

## 2015-10-14 DIAGNOSIS — D631 Anemia in chronic kidney disease: Secondary | ICD-10-CM | POA: Diagnosis not present

## 2015-10-14 DIAGNOSIS — N186 End stage renal disease: Secondary | ICD-10-CM | POA: Diagnosis not present

## 2015-10-16 DIAGNOSIS — N186 End stage renal disease: Secondary | ICD-10-CM | POA: Diagnosis not present

## 2015-10-16 DIAGNOSIS — N2581 Secondary hyperparathyroidism of renal origin: Secondary | ICD-10-CM | POA: Diagnosis not present

## 2015-10-16 DIAGNOSIS — D509 Iron deficiency anemia, unspecified: Secondary | ICD-10-CM | POA: Diagnosis not present

## 2015-10-16 DIAGNOSIS — D631 Anemia in chronic kidney disease: Secondary | ICD-10-CM | POA: Diagnosis not present

## 2015-10-18 DIAGNOSIS — I160 Hypertensive urgency: Secondary | ICD-10-CM | POA: Diagnosis not present

## 2015-10-18 DIAGNOSIS — E8770 Fluid overload, unspecified: Secondary | ICD-10-CM | POA: Diagnosis not present

## 2015-10-18 DIAGNOSIS — G40909 Epilepsy, unspecified, not intractable, without status epilepticus: Secondary | ICD-10-CM | POA: Diagnosis present

## 2015-10-18 DIAGNOSIS — N186 End stage renal disease: Secondary | ICD-10-CM | POA: Diagnosis not present

## 2015-10-18 DIAGNOSIS — R7981 Abnormal blood-gas level: Secondary | ICD-10-CM | POA: Diagnosis not present

## 2015-10-18 DIAGNOSIS — I10 Essential (primary) hypertension: Secondary | ICD-10-CM | POA: Diagnosis not present

## 2015-10-18 DIAGNOSIS — R0789 Other chest pain: Secondary | ICD-10-CM | POA: Diagnosis not present

## 2015-10-18 DIAGNOSIS — E875 Hyperkalemia: Secondary | ICD-10-CM | POA: Diagnosis not present

## 2015-10-18 DIAGNOSIS — Z87891 Personal history of nicotine dependence: Secondary | ICD-10-CM | POA: Diagnosis not present

## 2015-10-18 DIAGNOSIS — J90 Pleural effusion, not elsewhere classified: Secondary | ICD-10-CM | POA: Diagnosis not present

## 2015-10-18 DIAGNOSIS — Z9119 Patient's noncompliance with other medical treatment and regimen: Secondary | ICD-10-CM | POA: Diagnosis not present

## 2015-10-18 DIAGNOSIS — I120 Hypertensive chronic kidney disease with stage 5 chronic kidney disease or end stage renal disease: Secondary | ICD-10-CM | POA: Diagnosis not present

## 2015-10-18 DIAGNOSIS — M549 Dorsalgia, unspecified: Secondary | ICD-10-CM | POA: Diagnosis not present

## 2015-10-18 DIAGNOSIS — M109 Gout, unspecified: Secondary | ICD-10-CM | POA: Diagnosis present

## 2015-10-18 DIAGNOSIS — R1110 Vomiting, unspecified: Secondary | ICD-10-CM | POA: Diagnosis not present

## 2015-10-18 DIAGNOSIS — R600 Localized edema: Secondary | ICD-10-CM | POA: Diagnosis not present

## 2015-10-18 DIAGNOSIS — R079 Chest pain, unspecified: Secondary | ICD-10-CM | POA: Diagnosis not present

## 2015-10-18 DIAGNOSIS — E872 Acidosis: Secondary | ICD-10-CM | POA: Diagnosis not present

## 2015-10-18 DIAGNOSIS — R0602 Shortness of breath: Secondary | ICD-10-CM | POA: Diagnosis not present

## 2015-10-18 DIAGNOSIS — J9601 Acute respiratory failure with hypoxia: Secondary | ICD-10-CM | POA: Diagnosis not present

## 2015-10-18 DIAGNOSIS — R062 Wheezing: Secondary | ICD-10-CM | POA: Diagnosis not present

## 2015-10-18 DIAGNOSIS — Z992 Dependence on renal dialysis: Secondary | ICD-10-CM | POA: Diagnosis not present

## 2015-10-18 DIAGNOSIS — J810 Acute pulmonary edema: Secondary | ICD-10-CM | POA: Diagnosis not present

## 2015-10-23 DIAGNOSIS — D509 Iron deficiency anemia, unspecified: Secondary | ICD-10-CM | POA: Diagnosis not present

## 2015-10-23 DIAGNOSIS — D631 Anemia in chronic kidney disease: Secondary | ICD-10-CM | POA: Diagnosis not present

## 2015-10-23 DIAGNOSIS — N186 End stage renal disease: Secondary | ICD-10-CM | POA: Diagnosis not present

## 2015-10-23 DIAGNOSIS — N2581 Secondary hyperparathyroidism of renal origin: Secondary | ICD-10-CM | POA: Diagnosis not present

## 2015-10-28 DIAGNOSIS — D509 Iron deficiency anemia, unspecified: Secondary | ICD-10-CM | POA: Diagnosis not present

## 2015-10-28 DIAGNOSIS — N2581 Secondary hyperparathyroidism of renal origin: Secondary | ICD-10-CM | POA: Diagnosis not present

## 2015-10-28 DIAGNOSIS — N186 End stage renal disease: Secondary | ICD-10-CM | POA: Diagnosis not present

## 2015-10-28 DIAGNOSIS — D631 Anemia in chronic kidney disease: Secondary | ICD-10-CM | POA: Diagnosis not present

## 2015-10-30 DIAGNOSIS — N2581 Secondary hyperparathyroidism of renal origin: Secondary | ICD-10-CM | POA: Diagnosis not present

## 2015-10-30 DIAGNOSIS — N186 End stage renal disease: Secondary | ICD-10-CM | POA: Diagnosis not present

## 2015-10-30 DIAGNOSIS — D631 Anemia in chronic kidney disease: Secondary | ICD-10-CM | POA: Diagnosis not present

## 2015-10-30 DIAGNOSIS — D509 Iron deficiency anemia, unspecified: Secondary | ICD-10-CM | POA: Diagnosis not present

## 2015-11-02 DIAGNOSIS — N186 End stage renal disease: Secondary | ICD-10-CM | POA: Diagnosis not present

## 2015-11-02 DIAGNOSIS — N2581 Secondary hyperparathyroidism of renal origin: Secondary | ICD-10-CM | POA: Diagnosis not present

## 2015-11-02 DIAGNOSIS — D509 Iron deficiency anemia, unspecified: Secondary | ICD-10-CM | POA: Diagnosis not present

## 2015-11-02 DIAGNOSIS — D631 Anemia in chronic kidney disease: Secondary | ICD-10-CM | POA: Diagnosis not present

## 2015-11-04 DIAGNOSIS — D631 Anemia in chronic kidney disease: Secondary | ICD-10-CM | POA: Diagnosis not present

## 2015-11-04 DIAGNOSIS — N2581 Secondary hyperparathyroidism of renal origin: Secondary | ICD-10-CM | POA: Diagnosis not present

## 2015-11-04 DIAGNOSIS — D509 Iron deficiency anemia, unspecified: Secondary | ICD-10-CM | POA: Diagnosis not present

## 2015-11-04 DIAGNOSIS — N186 End stage renal disease: Secondary | ICD-10-CM | POA: Diagnosis not present

## 2015-11-06 DIAGNOSIS — N2581 Secondary hyperparathyroidism of renal origin: Secondary | ICD-10-CM | POA: Diagnosis not present

## 2015-11-06 DIAGNOSIS — D631 Anemia in chronic kidney disease: Secondary | ICD-10-CM | POA: Diagnosis not present

## 2015-11-06 DIAGNOSIS — N186 End stage renal disease: Secondary | ICD-10-CM | POA: Diagnosis not present

## 2015-11-06 DIAGNOSIS — D509 Iron deficiency anemia, unspecified: Secondary | ICD-10-CM | POA: Diagnosis not present

## 2015-11-09 DIAGNOSIS — D631 Anemia in chronic kidney disease: Secondary | ICD-10-CM | POA: Diagnosis not present

## 2015-11-09 DIAGNOSIS — N186 End stage renal disease: Secondary | ICD-10-CM | POA: Diagnosis not present

## 2015-11-09 DIAGNOSIS — D509 Iron deficiency anemia, unspecified: Secondary | ICD-10-CM | POA: Diagnosis not present

## 2015-11-09 DIAGNOSIS — N2581 Secondary hyperparathyroidism of renal origin: Secondary | ICD-10-CM | POA: Diagnosis not present

## 2015-11-11 DIAGNOSIS — D631 Anemia in chronic kidney disease: Secondary | ICD-10-CM | POA: Diagnosis not present

## 2015-11-11 DIAGNOSIS — D509 Iron deficiency anemia, unspecified: Secondary | ICD-10-CM | POA: Diagnosis not present

## 2015-11-11 DIAGNOSIS — N186 End stage renal disease: Secondary | ICD-10-CM | POA: Diagnosis not present

## 2015-11-11 DIAGNOSIS — N2581 Secondary hyperparathyroidism of renal origin: Secondary | ICD-10-CM | POA: Diagnosis not present

## 2015-11-13 DIAGNOSIS — D631 Anemia in chronic kidney disease: Secondary | ICD-10-CM | POA: Diagnosis not present

## 2015-11-13 DIAGNOSIS — N2581 Secondary hyperparathyroidism of renal origin: Secondary | ICD-10-CM | POA: Diagnosis not present

## 2015-11-13 DIAGNOSIS — I10 Essential (primary) hypertension: Secondary | ICD-10-CM | POA: Diagnosis not present

## 2015-11-13 DIAGNOSIS — D509 Iron deficiency anemia, unspecified: Secondary | ICD-10-CM | POA: Diagnosis not present

## 2015-11-13 DIAGNOSIS — N186 End stage renal disease: Secondary | ICD-10-CM | POA: Diagnosis not present

## 2015-11-13 DIAGNOSIS — Z992 Dependence on renal dialysis: Secondary | ICD-10-CM | POA: Diagnosis not present

## 2015-11-18 DIAGNOSIS — D631 Anemia in chronic kidney disease: Secondary | ICD-10-CM | POA: Diagnosis not present

## 2015-11-18 DIAGNOSIS — N186 End stage renal disease: Secondary | ICD-10-CM | POA: Diagnosis not present

## 2015-11-18 DIAGNOSIS — N2581 Secondary hyperparathyroidism of renal origin: Secondary | ICD-10-CM | POA: Diagnosis not present

## 2015-11-20 DIAGNOSIS — D631 Anemia in chronic kidney disease: Secondary | ICD-10-CM | POA: Diagnosis not present

## 2015-11-20 DIAGNOSIS — N186 End stage renal disease: Secondary | ICD-10-CM | POA: Diagnosis not present

## 2015-11-20 DIAGNOSIS — N2581 Secondary hyperparathyroidism of renal origin: Secondary | ICD-10-CM | POA: Diagnosis not present

## 2015-11-23 DIAGNOSIS — N186 End stage renal disease: Secondary | ICD-10-CM | POA: Diagnosis not present

## 2015-11-23 DIAGNOSIS — D631 Anemia in chronic kidney disease: Secondary | ICD-10-CM | POA: Diagnosis not present

## 2015-11-23 DIAGNOSIS — N2581 Secondary hyperparathyroidism of renal origin: Secondary | ICD-10-CM | POA: Diagnosis not present

## 2015-11-25 DIAGNOSIS — N186 End stage renal disease: Secondary | ICD-10-CM | POA: Diagnosis not present

## 2015-11-25 DIAGNOSIS — D631 Anemia in chronic kidney disease: Secondary | ICD-10-CM | POA: Diagnosis not present

## 2015-11-25 DIAGNOSIS — N2581 Secondary hyperparathyroidism of renal origin: Secondary | ICD-10-CM | POA: Diagnosis not present

## 2015-11-27 DIAGNOSIS — N2581 Secondary hyperparathyroidism of renal origin: Secondary | ICD-10-CM | POA: Diagnosis not present

## 2015-11-27 DIAGNOSIS — D631 Anemia in chronic kidney disease: Secondary | ICD-10-CM | POA: Diagnosis not present

## 2015-11-27 DIAGNOSIS — N186 End stage renal disease: Secondary | ICD-10-CM | POA: Diagnosis not present

## 2015-11-30 DIAGNOSIS — D631 Anemia in chronic kidney disease: Secondary | ICD-10-CM | POA: Diagnosis not present

## 2015-11-30 DIAGNOSIS — N186 End stage renal disease: Secondary | ICD-10-CM | POA: Diagnosis not present

## 2015-11-30 DIAGNOSIS — N2581 Secondary hyperparathyroidism of renal origin: Secondary | ICD-10-CM | POA: Diagnosis not present

## 2015-12-02 DIAGNOSIS — N186 End stage renal disease: Secondary | ICD-10-CM | POA: Diagnosis not present

## 2015-12-02 DIAGNOSIS — D631 Anemia in chronic kidney disease: Secondary | ICD-10-CM | POA: Diagnosis not present

## 2015-12-02 DIAGNOSIS — N2581 Secondary hyperparathyroidism of renal origin: Secondary | ICD-10-CM | POA: Diagnosis not present

## 2015-12-04 DIAGNOSIS — N2581 Secondary hyperparathyroidism of renal origin: Secondary | ICD-10-CM | POA: Diagnosis not present

## 2015-12-04 DIAGNOSIS — N186 End stage renal disease: Secondary | ICD-10-CM | POA: Diagnosis not present

## 2015-12-04 DIAGNOSIS — D631 Anemia in chronic kidney disease: Secondary | ICD-10-CM | POA: Diagnosis not present

## 2015-12-07 DIAGNOSIS — D631 Anemia in chronic kidney disease: Secondary | ICD-10-CM | POA: Diagnosis not present

## 2015-12-07 DIAGNOSIS — N186 End stage renal disease: Secondary | ICD-10-CM | POA: Diagnosis not present

## 2015-12-07 DIAGNOSIS — N2581 Secondary hyperparathyroidism of renal origin: Secondary | ICD-10-CM | POA: Diagnosis not present

## 2015-12-09 DIAGNOSIS — N2581 Secondary hyperparathyroidism of renal origin: Secondary | ICD-10-CM | POA: Diagnosis not present

## 2015-12-09 DIAGNOSIS — D631 Anemia in chronic kidney disease: Secondary | ICD-10-CM | POA: Diagnosis not present

## 2015-12-09 DIAGNOSIS — N186 End stage renal disease: Secondary | ICD-10-CM | POA: Diagnosis not present

## 2015-12-11 DIAGNOSIS — N2581 Secondary hyperparathyroidism of renal origin: Secondary | ICD-10-CM | POA: Diagnosis not present

## 2015-12-11 DIAGNOSIS — D631 Anemia in chronic kidney disease: Secondary | ICD-10-CM | POA: Diagnosis not present

## 2015-12-11 DIAGNOSIS — N186 End stage renal disease: Secondary | ICD-10-CM | POA: Diagnosis not present

## 2015-12-14 DIAGNOSIS — N186 End stage renal disease: Secondary | ICD-10-CM | POA: Diagnosis not present

## 2015-12-14 DIAGNOSIS — D631 Anemia in chronic kidney disease: Secondary | ICD-10-CM | POA: Diagnosis not present

## 2015-12-14 DIAGNOSIS — Z992 Dependence on renal dialysis: Secondary | ICD-10-CM | POA: Diagnosis not present

## 2015-12-14 DIAGNOSIS — N2581 Secondary hyperparathyroidism of renal origin: Secondary | ICD-10-CM | POA: Diagnosis not present

## 2015-12-14 DIAGNOSIS — I10 Essential (primary) hypertension: Secondary | ICD-10-CM | POA: Diagnosis not present

## 2015-12-16 DIAGNOSIS — Z23 Encounter for immunization: Secondary | ICD-10-CM | POA: Diagnosis not present

## 2015-12-16 DIAGNOSIS — D509 Iron deficiency anemia, unspecified: Secondary | ICD-10-CM | POA: Diagnosis not present

## 2015-12-16 DIAGNOSIS — N186 End stage renal disease: Secondary | ICD-10-CM | POA: Diagnosis not present

## 2015-12-16 DIAGNOSIS — D649 Anemia, unspecified: Secondary | ICD-10-CM | POA: Diagnosis not present

## 2015-12-16 DIAGNOSIS — N2581 Secondary hyperparathyroidism of renal origin: Secondary | ICD-10-CM | POA: Diagnosis not present

## 2015-12-18 DIAGNOSIS — N2581 Secondary hyperparathyroidism of renal origin: Secondary | ICD-10-CM | POA: Diagnosis not present

## 2015-12-18 DIAGNOSIS — Z23 Encounter for immunization: Secondary | ICD-10-CM | POA: Diagnosis not present

## 2015-12-18 DIAGNOSIS — D509 Iron deficiency anemia, unspecified: Secondary | ICD-10-CM | POA: Diagnosis not present

## 2015-12-18 DIAGNOSIS — D649 Anemia, unspecified: Secondary | ICD-10-CM | POA: Diagnosis not present

## 2015-12-18 DIAGNOSIS — N186 End stage renal disease: Secondary | ICD-10-CM | POA: Diagnosis not present

## 2015-12-21 DIAGNOSIS — D509 Iron deficiency anemia, unspecified: Secondary | ICD-10-CM | POA: Diagnosis not present

## 2015-12-21 DIAGNOSIS — D649 Anemia, unspecified: Secondary | ICD-10-CM | POA: Diagnosis not present

## 2015-12-21 DIAGNOSIS — N2581 Secondary hyperparathyroidism of renal origin: Secondary | ICD-10-CM | POA: Diagnosis not present

## 2015-12-21 DIAGNOSIS — N186 End stage renal disease: Secondary | ICD-10-CM | POA: Diagnosis not present

## 2015-12-21 DIAGNOSIS — Z23 Encounter for immunization: Secondary | ICD-10-CM | POA: Diagnosis not present

## 2015-12-23 DIAGNOSIS — Z23 Encounter for immunization: Secondary | ICD-10-CM | POA: Diagnosis not present

## 2015-12-23 DIAGNOSIS — N186 End stage renal disease: Secondary | ICD-10-CM | POA: Diagnosis not present

## 2015-12-23 DIAGNOSIS — N2581 Secondary hyperparathyroidism of renal origin: Secondary | ICD-10-CM | POA: Diagnosis not present

## 2015-12-23 DIAGNOSIS — D649 Anemia, unspecified: Secondary | ICD-10-CM | POA: Diagnosis not present

## 2015-12-23 DIAGNOSIS — D509 Iron deficiency anemia, unspecified: Secondary | ICD-10-CM | POA: Diagnosis not present

## 2015-12-25 DIAGNOSIS — N2581 Secondary hyperparathyroidism of renal origin: Secondary | ICD-10-CM | POA: Diagnosis not present

## 2015-12-25 DIAGNOSIS — Z23 Encounter for immunization: Secondary | ICD-10-CM | POA: Diagnosis not present

## 2015-12-25 DIAGNOSIS — D649 Anemia, unspecified: Secondary | ICD-10-CM | POA: Diagnosis not present

## 2015-12-25 DIAGNOSIS — D509 Iron deficiency anemia, unspecified: Secondary | ICD-10-CM | POA: Diagnosis not present

## 2015-12-25 DIAGNOSIS — N186 End stage renal disease: Secondary | ICD-10-CM | POA: Diagnosis not present

## 2015-12-30 DIAGNOSIS — N2581 Secondary hyperparathyroidism of renal origin: Secondary | ICD-10-CM | POA: Diagnosis not present

## 2015-12-30 DIAGNOSIS — N186 End stage renal disease: Secondary | ICD-10-CM | POA: Diagnosis not present

## 2015-12-30 DIAGNOSIS — D649 Anemia, unspecified: Secondary | ICD-10-CM | POA: Diagnosis not present

## 2015-12-30 DIAGNOSIS — Z23 Encounter for immunization: Secondary | ICD-10-CM | POA: Diagnosis not present

## 2015-12-30 DIAGNOSIS — D509 Iron deficiency anemia, unspecified: Secondary | ICD-10-CM | POA: Diagnosis not present

## 2016-01-01 DIAGNOSIS — D649 Anemia, unspecified: Secondary | ICD-10-CM | POA: Diagnosis not present

## 2016-01-01 DIAGNOSIS — N186 End stage renal disease: Secondary | ICD-10-CM | POA: Diagnosis not present

## 2016-01-01 DIAGNOSIS — N2581 Secondary hyperparathyroidism of renal origin: Secondary | ICD-10-CM | POA: Diagnosis not present

## 2016-01-01 DIAGNOSIS — Z23 Encounter for immunization: Secondary | ICD-10-CM | POA: Diagnosis not present

## 2016-01-01 DIAGNOSIS — D509 Iron deficiency anemia, unspecified: Secondary | ICD-10-CM | POA: Diagnosis not present

## 2016-01-04 DIAGNOSIS — D649 Anemia, unspecified: Secondary | ICD-10-CM | POA: Diagnosis not present

## 2016-01-04 DIAGNOSIS — Z23 Encounter for immunization: Secondary | ICD-10-CM | POA: Diagnosis not present

## 2016-01-04 DIAGNOSIS — D509 Iron deficiency anemia, unspecified: Secondary | ICD-10-CM | POA: Diagnosis not present

## 2016-01-04 DIAGNOSIS — N2581 Secondary hyperparathyroidism of renal origin: Secondary | ICD-10-CM | POA: Diagnosis not present

## 2016-01-04 DIAGNOSIS — N186 End stage renal disease: Secondary | ICD-10-CM | POA: Diagnosis not present

## 2016-01-06 DIAGNOSIS — Z23 Encounter for immunization: Secondary | ICD-10-CM | POA: Diagnosis not present

## 2016-01-06 DIAGNOSIS — D649 Anemia, unspecified: Secondary | ICD-10-CM | POA: Diagnosis not present

## 2016-01-06 DIAGNOSIS — N186 End stage renal disease: Secondary | ICD-10-CM | POA: Diagnosis not present

## 2016-01-06 DIAGNOSIS — D509 Iron deficiency anemia, unspecified: Secondary | ICD-10-CM | POA: Diagnosis not present

## 2016-01-06 DIAGNOSIS — N2581 Secondary hyperparathyroidism of renal origin: Secondary | ICD-10-CM | POA: Diagnosis not present

## 2016-01-08 DIAGNOSIS — D649 Anemia, unspecified: Secondary | ICD-10-CM | POA: Diagnosis not present

## 2016-01-08 DIAGNOSIS — D509 Iron deficiency anemia, unspecified: Secondary | ICD-10-CM | POA: Diagnosis not present

## 2016-01-08 DIAGNOSIS — Z23 Encounter for immunization: Secondary | ICD-10-CM | POA: Diagnosis not present

## 2016-01-08 DIAGNOSIS — N2581 Secondary hyperparathyroidism of renal origin: Secondary | ICD-10-CM | POA: Diagnosis not present

## 2016-01-08 DIAGNOSIS — N186 End stage renal disease: Secondary | ICD-10-CM | POA: Diagnosis not present

## 2016-01-11 DIAGNOSIS — N186 End stage renal disease: Secondary | ICD-10-CM | POA: Diagnosis not present

## 2016-01-11 DIAGNOSIS — D649 Anemia, unspecified: Secondary | ICD-10-CM | POA: Diagnosis not present

## 2016-01-11 DIAGNOSIS — D509 Iron deficiency anemia, unspecified: Secondary | ICD-10-CM | POA: Diagnosis not present

## 2016-01-11 DIAGNOSIS — Z23 Encounter for immunization: Secondary | ICD-10-CM | POA: Diagnosis not present

## 2016-01-11 DIAGNOSIS — N2581 Secondary hyperparathyroidism of renal origin: Secondary | ICD-10-CM | POA: Diagnosis not present

## 2016-01-14 DIAGNOSIS — Z992 Dependence on renal dialysis: Secondary | ICD-10-CM | POA: Diagnosis not present

## 2016-01-14 DIAGNOSIS — D649 Anemia, unspecified: Secondary | ICD-10-CM | POA: Diagnosis not present

## 2016-01-14 DIAGNOSIS — D509 Iron deficiency anemia, unspecified: Secondary | ICD-10-CM | POA: Diagnosis not present

## 2016-01-14 DIAGNOSIS — N2581 Secondary hyperparathyroidism of renal origin: Secondary | ICD-10-CM | POA: Diagnosis not present

## 2016-01-14 DIAGNOSIS — Z23 Encounter for immunization: Secondary | ICD-10-CM | POA: Diagnosis not present

## 2016-01-14 DIAGNOSIS — N186 End stage renal disease: Secondary | ICD-10-CM | POA: Diagnosis not present

## 2016-01-14 DIAGNOSIS — I10 Essential (primary) hypertension: Secondary | ICD-10-CM | POA: Diagnosis not present

## 2016-01-15 DIAGNOSIS — N2581 Secondary hyperparathyroidism of renal origin: Secondary | ICD-10-CM | POA: Diagnosis not present

## 2016-01-15 DIAGNOSIS — N186 End stage renal disease: Secondary | ICD-10-CM | POA: Diagnosis not present

## 2016-01-15 DIAGNOSIS — D649 Anemia, unspecified: Secondary | ICD-10-CM | POA: Diagnosis not present

## 2016-01-20 DIAGNOSIS — N186 End stage renal disease: Secondary | ICD-10-CM | POA: Diagnosis not present

## 2016-01-20 DIAGNOSIS — N2581 Secondary hyperparathyroidism of renal origin: Secondary | ICD-10-CM | POA: Diagnosis not present

## 2016-01-20 DIAGNOSIS — D649 Anemia, unspecified: Secondary | ICD-10-CM | POA: Diagnosis not present

## 2016-01-22 DIAGNOSIS — N2581 Secondary hyperparathyroidism of renal origin: Secondary | ICD-10-CM | POA: Diagnosis not present

## 2016-01-22 DIAGNOSIS — N186 End stage renal disease: Secondary | ICD-10-CM | POA: Diagnosis not present

## 2016-01-22 DIAGNOSIS — D649 Anemia, unspecified: Secondary | ICD-10-CM | POA: Diagnosis not present

## 2016-01-25 DIAGNOSIS — N186 End stage renal disease: Secondary | ICD-10-CM | POA: Diagnosis not present

## 2016-01-25 DIAGNOSIS — D649 Anemia, unspecified: Secondary | ICD-10-CM | POA: Diagnosis not present

## 2016-01-25 DIAGNOSIS — N2581 Secondary hyperparathyroidism of renal origin: Secondary | ICD-10-CM | POA: Diagnosis not present

## 2016-01-27 DIAGNOSIS — N2581 Secondary hyperparathyroidism of renal origin: Secondary | ICD-10-CM | POA: Diagnosis not present

## 2016-01-27 DIAGNOSIS — D649 Anemia, unspecified: Secondary | ICD-10-CM | POA: Diagnosis not present

## 2016-01-27 DIAGNOSIS — N186 End stage renal disease: Secondary | ICD-10-CM | POA: Diagnosis not present

## 2016-01-29 DIAGNOSIS — N186 End stage renal disease: Secondary | ICD-10-CM | POA: Diagnosis not present

## 2016-01-29 DIAGNOSIS — N2581 Secondary hyperparathyroidism of renal origin: Secondary | ICD-10-CM | POA: Diagnosis not present

## 2016-01-29 DIAGNOSIS — D649 Anemia, unspecified: Secondary | ICD-10-CM | POA: Diagnosis not present

## 2016-02-01 DIAGNOSIS — N2581 Secondary hyperparathyroidism of renal origin: Secondary | ICD-10-CM | POA: Diagnosis not present

## 2016-02-01 DIAGNOSIS — N186 End stage renal disease: Secondary | ICD-10-CM | POA: Diagnosis not present

## 2016-02-01 DIAGNOSIS — D649 Anemia, unspecified: Secondary | ICD-10-CM | POA: Diagnosis not present

## 2016-02-03 DIAGNOSIS — D649 Anemia, unspecified: Secondary | ICD-10-CM | POA: Diagnosis not present

## 2016-02-03 DIAGNOSIS — N2581 Secondary hyperparathyroidism of renal origin: Secondary | ICD-10-CM | POA: Diagnosis not present

## 2016-02-03 DIAGNOSIS — N186 End stage renal disease: Secondary | ICD-10-CM | POA: Diagnosis not present

## 2016-02-05 DIAGNOSIS — N186 End stage renal disease: Secondary | ICD-10-CM | POA: Diagnosis not present

## 2016-02-05 DIAGNOSIS — D649 Anemia, unspecified: Secondary | ICD-10-CM | POA: Diagnosis not present

## 2016-02-05 DIAGNOSIS — N2581 Secondary hyperparathyroidism of renal origin: Secondary | ICD-10-CM | POA: Diagnosis not present

## 2016-02-06 ENCOUNTER — Other Ambulatory Visit: Payer: Self-pay | Admitting: Neurology

## 2016-02-09 DIAGNOSIS — N186 End stage renal disease: Secondary | ICD-10-CM | POA: Diagnosis not present

## 2016-02-09 DIAGNOSIS — N2581 Secondary hyperparathyroidism of renal origin: Secondary | ICD-10-CM | POA: Diagnosis not present

## 2016-02-09 DIAGNOSIS — D649 Anemia, unspecified: Secondary | ICD-10-CM | POA: Diagnosis not present

## 2016-02-10 DIAGNOSIS — N186 End stage renal disease: Secondary | ICD-10-CM | POA: Diagnosis not present

## 2016-02-10 DIAGNOSIS — D649 Anemia, unspecified: Secondary | ICD-10-CM | POA: Diagnosis not present

## 2016-02-10 DIAGNOSIS — N2581 Secondary hyperparathyroidism of renal origin: Secondary | ICD-10-CM | POA: Diagnosis not present

## 2016-02-12 DIAGNOSIS — N186 End stage renal disease: Secondary | ICD-10-CM | POA: Diagnosis not present

## 2016-02-13 DIAGNOSIS — I10 Essential (primary) hypertension: Secondary | ICD-10-CM | POA: Diagnosis not present

## 2016-02-13 DIAGNOSIS — N186 End stage renal disease: Secondary | ICD-10-CM | POA: Diagnosis not present

## 2016-02-13 DIAGNOSIS — Z992 Dependence on renal dialysis: Secondary | ICD-10-CM | POA: Diagnosis not present

## 2016-02-17 DIAGNOSIS — D649 Anemia, unspecified: Secondary | ICD-10-CM | POA: Diagnosis not present

## 2016-02-17 DIAGNOSIS — D509 Iron deficiency anemia, unspecified: Secondary | ICD-10-CM | POA: Diagnosis not present

## 2016-02-17 DIAGNOSIS — N2581 Secondary hyperparathyroidism of renal origin: Secondary | ICD-10-CM | POA: Diagnosis not present

## 2016-02-17 DIAGNOSIS — N186 End stage renal disease: Secondary | ICD-10-CM | POA: Diagnosis not present

## 2016-02-17 DIAGNOSIS — D631 Anemia in chronic kidney disease: Secondary | ICD-10-CM | POA: Diagnosis not present

## 2016-02-19 DIAGNOSIS — D631 Anemia in chronic kidney disease: Secondary | ICD-10-CM | POA: Diagnosis not present

## 2016-02-19 DIAGNOSIS — D509 Iron deficiency anemia, unspecified: Secondary | ICD-10-CM | POA: Diagnosis not present

## 2016-02-19 DIAGNOSIS — D649 Anemia, unspecified: Secondary | ICD-10-CM | POA: Diagnosis not present

## 2016-02-19 DIAGNOSIS — N186 End stage renal disease: Secondary | ICD-10-CM | POA: Diagnosis not present

## 2016-02-19 DIAGNOSIS — N2581 Secondary hyperparathyroidism of renal origin: Secondary | ICD-10-CM | POA: Diagnosis not present

## 2016-02-23 ENCOUNTER — Encounter (HOSPITAL_COMMUNITY): Payer: Self-pay | Admitting: Emergency Medicine

## 2016-02-23 ENCOUNTER — Inpatient Hospital Stay (HOSPITAL_COMMUNITY)
Admission: EM | Admit: 2016-02-23 | Discharge: 2016-02-26 | DRG: 291 | Disposition: A | Discharge status: Home / Self Care | Payer: Medicare Other | Attending: Internal Medicine | Admitting: Internal Medicine

## 2016-02-23 ENCOUNTER — Emergency Department (HOSPITAL_COMMUNITY): Payer: Medicare Other

## 2016-02-23 DIAGNOSIS — I4891 Unspecified atrial fibrillation: Secondary | ICD-10-CM | POA: Diagnosis present

## 2016-02-23 DIAGNOSIS — J189 Pneumonia, unspecified organism: Secondary | ICD-10-CM | POA: Diagnosis present

## 2016-02-23 DIAGNOSIS — N186 End stage renal disease: Secondary | ICD-10-CM | POA: Diagnosis not present

## 2016-02-23 DIAGNOSIS — J9690 Respiratory failure, unspecified, unspecified whether with hypoxia or hypercapnia: Secondary | ICD-10-CM | POA: Diagnosis present

## 2016-02-23 DIAGNOSIS — I480 Paroxysmal atrial fibrillation: Secondary | ICD-10-CM | POA: Diagnosis present

## 2016-02-23 DIAGNOSIS — R0602 Shortness of breath: Secondary | ICD-10-CM | POA: Diagnosis not present

## 2016-02-23 DIAGNOSIS — D631 Anemia in chronic kidney disease: Secondary | ICD-10-CM | POA: Diagnosis not present

## 2016-02-23 DIAGNOSIS — Z8249 Family history of ischemic heart disease and other diseases of the circulatory system: Secondary | ICD-10-CM

## 2016-02-23 DIAGNOSIS — E875 Hyperkalemia: Secondary | ICD-10-CM | POA: Diagnosis present

## 2016-02-23 DIAGNOSIS — I5032 Chronic diastolic (congestive) heart failure: Secondary | ICD-10-CM | POA: Diagnosis not present

## 2016-02-23 DIAGNOSIS — G40909 Epilepsy, unspecified, not intractable, without status epilepticus: Secondary | ICD-10-CM | POA: Diagnosis present

## 2016-02-23 DIAGNOSIS — R0600 Dyspnea, unspecified: Secondary | ICD-10-CM | POA: Diagnosis not present

## 2016-02-23 DIAGNOSIS — Z720 Tobacco use: Secondary | ICD-10-CM

## 2016-02-23 DIAGNOSIS — E43 Unspecified severe protein-calorie malnutrition: Secondary | ICD-10-CM | POA: Diagnosis present

## 2016-02-23 DIAGNOSIS — N2581 Secondary hyperparathyroidism of renal origin: Secondary | ICD-10-CM | POA: Diagnosis not present

## 2016-02-23 DIAGNOSIS — I132 Hypertensive heart and chronic kidney disease with heart failure and with stage 5 chronic kidney disease, or end stage renal disease: Secondary | ICD-10-CM | POA: Diagnosis not present

## 2016-02-23 DIAGNOSIS — I5033 Acute on chronic diastolic (congestive) heart failure: Secondary | ICD-10-CM | POA: Diagnosis present

## 2016-02-23 DIAGNOSIS — Z9119 Patient's noncompliance with other medical treatment and regimen: Secondary | ICD-10-CM

## 2016-02-23 DIAGNOSIS — Z992 Dependence on renal dialysis: Secondary | ICD-10-CM | POA: Diagnosis not present

## 2016-02-23 DIAGNOSIS — N189 Chronic kidney disease, unspecified: Secondary | ICD-10-CM

## 2016-02-23 DIAGNOSIS — R531 Weakness: Secondary | ICD-10-CM | POA: Diagnosis not present

## 2016-02-23 DIAGNOSIS — J9601 Acute respiratory failure with hypoxia: Secondary | ICD-10-CM | POA: Diagnosis not present

## 2016-02-23 DIAGNOSIS — I160 Hypertensive urgency: Secondary | ICD-10-CM | POA: Diagnosis present

## 2016-02-23 DIAGNOSIS — I10 Essential (primary) hypertension: Secondary | ICD-10-CM | POA: Diagnosis present

## 2016-02-23 DIAGNOSIS — I120 Hypertensive chronic kidney disease with stage 5 chronic kidney disease or end stage renal disease: Secondary | ICD-10-CM | POA: Diagnosis not present

## 2016-02-23 DIAGNOSIS — Z7982 Long term (current) use of aspirin: Secondary | ICD-10-CM

## 2016-02-23 DIAGNOSIS — J9600 Acute respiratory failure, unspecified whether with hypoxia or hypercapnia: Secondary | ICD-10-CM | POA: Diagnosis present

## 2016-02-23 DIAGNOSIS — F17290 Nicotine dependence, other tobacco product, uncomplicated: Secondary | ICD-10-CM | POA: Diagnosis present

## 2016-02-23 DIAGNOSIS — F1410 Cocaine abuse, uncomplicated: Secondary | ICD-10-CM | POA: Diagnosis present

## 2016-02-23 DIAGNOSIS — R509 Fever, unspecified: Secondary | ICD-10-CM | POA: Diagnosis not present

## 2016-02-23 DIAGNOSIS — E8889 Other specified metabolic disorders: Secondary | ICD-10-CM | POA: Diagnosis present

## 2016-02-23 DIAGNOSIS — J440 Chronic obstructive pulmonary disease with acute lower respiratory infection: Secondary | ICD-10-CM | POA: Diagnosis present

## 2016-02-23 DIAGNOSIS — F17210 Nicotine dependence, cigarettes, uncomplicated: Secondary | ICD-10-CM | POA: Diagnosis present

## 2016-02-23 LAB — I-STAT CHEM 8, ED
BUN: >140 mg/dL — ABNORMAL HIGH (ref 6–20)
CALCIUM ION: 1.06 mmol/L — AB (ref 1.15–1.40)
CHLORIDE: 100 mmol/L — AB (ref 101–111)
Creatinine, Ser: >18 mg/dL — ABNORMAL HIGH (ref 0.61–1.24)
GLUCOSE: 92 mg/dL (ref 65–99)
HEMATOCRIT: 31 % — AB (ref 39.0–52.0)
HEMOGLOBIN: 10.5 g/dL — AB (ref 13.0–17.0)
POTASSIUM: 8.2 mmol/L — AB (ref 3.5–5.1)
SODIUM: 130 mmol/L — AB (ref 135–145)
TCO2: 19 mmol/L (ref 0–100)

## 2016-02-23 LAB — CBC WITH DIFFERENTIAL/PLATELET
BASOS ABS: 0 10*3/uL (ref 0.0–0.1)
BASOS PCT: 0 %
EOS PCT: 0 %
Eosinophils Absolute: 0 10*3/uL (ref 0.0–0.7)
HEMATOCRIT: 29.6 % — AB (ref 39.0–52.0)
Hemoglobin: 9.2 g/dL — ABNORMAL LOW (ref 13.0–17.0)
LYMPHS PCT: 10 %
Lymphs Abs: 0.9 10*3/uL (ref 0.7–4.0)
MCH: 30.8 pg (ref 26.0–34.0)
MCHC: 31.1 g/dL (ref 30.0–36.0)
MCV: 99 fL (ref 78.0–100.0)
MONO ABS: 0.4 10*3/uL (ref 0.1–1.0)
Monocytes Relative: 4 %
NEUTROS ABS: 8 10*3/uL — AB (ref 1.7–7.7)
Neutrophils Relative %: 86 %
PLATELETS: 227 10*3/uL (ref 150–400)
RBC: 2.99 MIL/uL — AB (ref 4.22–5.81)
RDW: 16.6 % — AB (ref 11.5–15.5)
WBC: 9.3 10*3/uL (ref 4.0–10.5)

## 2016-02-23 LAB — I-STAT TROPONIN, ED: TROPONIN I, POC: 0.03 ng/mL (ref 0.00–0.08)

## 2016-02-23 LAB — COMPREHENSIVE METABOLIC PANEL
ALK PHOS: 120 U/L (ref 38–126)
ALT: 22 U/L (ref 17–63)
ANION GAP: 23 — AB (ref 5–15)
AST: 15 U/L (ref 15–41)
Albumin: 4.1 g/dL (ref 3.5–5.0)
BILIRUBIN TOTAL: 0.6 mg/dL (ref 0.3–1.2)
BUN: 147 mg/dL — ABNORMAL HIGH (ref 6–20)
CALCIUM: 9.4 mg/dL (ref 8.9–10.3)
CO2: 17 mmol/L — ABNORMAL LOW (ref 22–32)
Chloride: 93 mmol/L — ABNORMAL LOW (ref 101–111)
Creatinine, Ser: 19.36 mg/dL — ABNORMAL HIGH (ref 0.61–1.24)
GFR calc Af Amer: 3 mL/min — ABNORMAL LOW (ref >60)
GFR, EST NON AFRICAN AMERICAN: 2 mL/min — AB (ref >60)
Glucose, Bld: 96 mg/dL (ref 65–99)
Sodium: 133 mmol/L — ABNORMAL LOW (ref 135–145)
TOTAL PROTEIN: 7.6 g/dL (ref 6.5–8.1)

## 2016-02-23 MED ORDER — HEPARIN SODIUM (PORCINE) 5000 UNIT/ML IJ SOLN
5000.0000 [IU] | Freq: Three times a day (TID) | INTRAMUSCULAR | Status: DC
  Administered 2016-02-23 – 2016-02-24 (×2): 5000 [IU] via SUBCUTANEOUS
  Filled 2016-02-23 (×6): qty 1

## 2016-02-23 MED ORDER — SODIUM CHLORIDE 0.9% FLUSH
3.0000 mL | Freq: Two times a day (BID) | INTRAVENOUS | Status: DC
  Administered 2016-02-23 – 2016-02-25 (×5): 3 mL via INTRAVENOUS

## 2016-02-23 MED ORDER — PANTOPRAZOLE SODIUM 20 MG PO TBEC
20.0000 mg | DELAYED_RELEASE_TABLET | Freq: Every day | ORAL | Status: DC
  Administered 2016-02-24 – 2016-02-25 (×2): 20 mg via ORAL
  Filled 2016-02-23 (×3): qty 1

## 2016-02-23 MED ORDER — SODIUM BICARBONATE 8.4 % IV SOLN: 50.0000 meq | Freq: Once | INTRAVENOUS | Status: DC

## 2016-02-23 MED ORDER — AMLODIPINE BESYLATE 10 MG PO TABS
10.0000 mg | ORAL_TABLET | Freq: Every day | ORAL | Status: DC
  Administered 2016-02-24 – 2016-02-26 (×3): 10 mg via ORAL
  Filled 2016-02-23 (×5): qty 1

## 2016-02-23 MED ORDER — LEVETIRACETAM 500 MG PO TABS
1000.0000 mg | ORAL_TABLET | Freq: Two times a day (BID) | ORAL | Status: DC
  Administered 2016-02-23 – 2016-02-26 (×6): 1000 mg via ORAL
  Filled 2016-02-23 (×6): qty 2

## 2016-02-23 MED ORDER — PENTAFLUOROPROP-TETRAFLUOROETH EX AERO
1.0000 "application " | INHALATION_SPRAY | CUTANEOUS | Status: DC | PRN
  Filled 2016-02-23: qty 30

## 2016-02-23 MED ORDER — CARVEDILOL 25 MG PO TABS
37.5000 mg | ORAL_TABLET | Freq: Two times a day (BID) | ORAL | Status: DC
  Administered 2016-02-24 – 2016-02-26 (×5): 37.5 mg via ORAL
  Filled 2016-02-23 (×5): qty 1

## 2016-02-23 MED ORDER — RENA-VITE PO TABS
1.0000 | ORAL_TABLET | Freq: Every day | ORAL | Status: DC
  Administered 2016-02-23 – 2016-02-25 (×3): 1 via ORAL
  Filled 2016-02-23 (×3): qty 1

## 2016-02-23 MED ORDER — SODIUM CHLORIDE 0.9 % IV SOLN
125.0000 mg | INTRAVENOUS | Status: DC
  Filled 2016-02-23: qty 10

## 2016-02-23 MED ORDER — SODIUM CHLORIDE 0.9 % IV SOLN: 100.0000 mL | INTRAVENOUS | Status: DC | PRN

## 2016-02-23 MED ORDER — CLONIDINE HCL 0.2 MG PO TABS
0.3000 mg | ORAL_TABLET | Freq: Two times a day (BID) | ORAL | Status: DC
  Administered 2016-02-23 – 2016-02-26 (×6): 0.3 mg via ORAL
  Filled 2016-02-23 (×7): qty 1

## 2016-02-23 MED ORDER — HYDROXYZINE HCL 25 MG PO TABS: 25.0000 mg | ORAL_TABLET | Freq: Three times a day (TID) | ORAL | Status: DC | PRN

## 2016-02-23 MED ORDER — CALCIUM CARBONATE ANTACID 500 MG PO CHEW
500.0000 mg | CHEWABLE_TABLET | Freq: Every day | ORAL | Status: DC | PRN
  Administered 2016-02-25: 500 mg via ORAL
  Filled 2016-02-23: qty 3

## 2016-02-23 MED ORDER — CINACALCET HCL 30 MG PO TABS
90.0000 mg | ORAL_TABLET | Freq: Every day | ORAL | Status: DC
  Administered 2016-02-24 – 2016-02-26 (×3): 90 mg via ORAL
  Filled 2016-02-23 (×3): qty 3

## 2016-02-23 MED ORDER — ACETAMINOPHEN 325 MG PO TABS
650.0000 mg | ORAL_TABLET | Freq: Once | ORAL | Status: AC
  Administered 2016-02-23: 650 mg via ORAL

## 2016-02-23 MED ORDER — DOXERCALCIFEROL 4 MCG/2ML IV SOLN
3.0000 ug | INTRAVENOUS | Status: DC
  Administered 2016-02-24 – 2016-02-26 (×2): 3 ug via INTRAVENOUS
  Filled 2016-02-23 (×2): qty 2

## 2016-02-23 MED ORDER — HYDRALAZINE HCL 25 MG PO TABS
25.0000 mg | ORAL_TABLET | Freq: Three times a day (TID) | ORAL | Status: DC
  Administered 2016-02-23 – 2016-02-26 (×8): 25 mg via ORAL
  Filled 2016-02-23 (×9): qty 1

## 2016-02-23 MED ORDER — ACETAMINOPHEN 650 MG RE SUPP: 650.0000 mg | Freq: Four times a day (QID) | RECTAL | Status: DC | PRN

## 2016-02-23 MED ORDER — CALCIUM GLUCONATE 10 % IV SOLN
1.0000 g | Freq: Once | INTRAVENOUS | Status: AC
  Administered 2016-02-23: 1 g via INTRAVENOUS
  Filled 2016-02-23: qty 10

## 2016-02-23 MED ORDER — INSULIN ASPART 100 UNIT/ML ~~LOC~~ SOLN: 5.0000 [IU] | Freq: Once | SUBCUTANEOUS | Status: DC

## 2016-02-23 MED ORDER — SEVELAMER CARBONATE 800 MG PO TABS
1600.0000 mg | ORAL_TABLET | Freq: Three times a day (TID) | ORAL | Status: DC
  Administered 2016-02-24 – 2016-02-25 (×5): 1600 mg via ORAL
  Filled 2016-02-23 (×5): qty 2

## 2016-02-23 MED ORDER — ACETAMINOPHEN 325 MG PO TABS
650.0000 mg | ORAL_TABLET | Freq: Four times a day (QID) | ORAL | Status: DC | PRN
  Administered 2016-02-25 – 2016-02-26 (×2): 650 mg via ORAL
  Filled 2016-02-23 (×3): qty 2

## 2016-02-23 MED ORDER — ALBUTEROL SULFATE (2.5 MG/3ML) 0.083% IN NEBU
5.0000 mg | INHALATION_SOLUTION | Freq: Once | RESPIRATORY_TRACT | Status: AC
  Administered 2016-02-23: 5 mg via RESPIRATORY_TRACT
  Filled 2016-02-23: qty 6

## 2016-02-23 MED ORDER — DEXTROSE 50 % IV SOLN: 1.0000 | Freq: Once | INTRAVENOUS | Status: DC

## 2016-02-23 MED ORDER — ASPIRIN 81 MG PO CHEW
81.0000 mg | CHEWABLE_TABLET | Freq: Every day | ORAL | Status: DC
  Administered 2016-02-24 – 2016-02-25 (×2): 81 mg via ORAL
  Filled 2016-02-23 (×3): qty 1

## 2016-02-23 MED ORDER — LOSARTAN POTASSIUM 50 MG PO TABS
100.0000 mg | ORAL_TABLET | Freq: Every day | ORAL | Status: DC
  Administered 2016-02-23: 100 mg via ORAL
  Filled 2016-02-23: qty 2

## 2016-02-23 MED ORDER — NEPRO/CARBSTEADY PO LIQD: 237.0000 mL | Freq: Three times a day (TID) | ORAL | Status: DC | PRN

## 2016-02-23 MED ORDER — ACETAMINOPHEN 325 MG PO TABS
ORAL_TABLET | ORAL | Status: AC
  Filled 2016-02-23: qty 2

## 2016-02-23 NOTE — Progress Notes (Signed)
RT note- patient is requesting to be off Bipap at this time. Placed on 4l/min [place], good cough, RN aware.

## 2016-02-23 NOTE — Progress Notes (Signed)
Pt admitted to the unit at 1815. Pt mental status is A&Ox4. Pt oriented to room, staff, and call bell. Skin is intact except where otherwise charted. Full assessment charted in [facility]. Call bell within reach. Visitor guidelines reviewed w/ pt and/or family.

## 2016-02-23 NOTE — ED Notes (Signed)
EKG given to Lawyer (PA-C), and Dr. Lita Mains.

## 2016-02-23 NOTE — ED Provider Notes (Signed)
[facility] DEPT Provider Note   CSN: [id number] Arrival date & time: 02/23/16  1122     History   Chief Complaint Chief Complaint  Patient presents with  . Shortness of Breath    HPI Jeremy Mccoy is a 50 y.o. male.  HPI Patient presents to the emergency department with shortness of breath that started late last night.  Patient states he did not go to dialysis yesterday and started getting worse today with shortness of breath.  He states that he called and rescheduled his dialysis for today, but was unable to go due to shortness of breath.  Patient is brought in by EMS on BiPAP. The patient denies chest pain,  headache,blurred vision, neck pain, fever, cough, weakness, numbness, dizziness, anorexia, edema, abdominal pain, nausea, vomiting, diarrhea, rash, back pain, dysuria, hematemesis, bloody stool, near syncope, or syncope. Past Medical History:  Diagnosis Date  . Anemia   . Anxiety   . Arthritis    "qwhere" (05/15/2013)  . COPD (chronic obstructive pulmonary disease) ([place])   . Crack cocaine use   . Dental caries   . Depression   . End stage renal disease ([place]) 01/11/2012   Patient presented 11/04/11 to hospital with CP, wt loss and N/V. Creat was 10, admitted. 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 ANA was + 1:80, +MPO and +pr3 Ab's (ANCA). Received plasmapheresis x 7, IV cytoxan and pred taper. First HD was 11/17/11. 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 Pt d/c'd to do outpt HD and may have gotten 1-2 additional Cytoxan as OP, but this was stopped eventually since renal function didn't recover. Now gets HD TTS schedule at [facility].  L forearm AVF 11/19/11 by Dr. Scot Dock is current access.    Marland Kitchen ESRD (end stage renal disease) on dialysis [facility])    TTS: [address] [place] (05/15/2013)  . ESRD (end stage renal disease) on dialysis ([place])   .  Headache(784.0)    "get it from going to dialysis; when they get real bad I come to hospital" (05/15/2013)  . Headache(784.0)   . Hematemesis/vomiting blood   . History of blood transfusion 10/2011; 04/2013  . HTN (hypertension) 06/12/2012  . Renal insufficiency   . Shortness of breath    "just when I have too much potassium is too high" (05/15/2013)    Patient Active Problem List   Diagnosis Date Noted  . Coughing blood   . Vasculitis ([place])   . Lung crackles   . Hemoptysis 07/25/2015  . Anemia 02/20/2014  . Symptomatic anemia 02/20/2014  . PAF (paroxysmal atrial fibrillation) ([place]) 01/31/2014  . Chronic diastolic congestive heart failure ([place]) 01/31/2014  . COPD (chronic obstructive pulmonary disease) ([place]) 01/31/2014  . Thrombocytopenia, unspecified 01/23/2014  . Unspecified constipation 01/23/2014  . High anion gap metabolic acidosis 71/69/6789  . ESRD on hemodialysis ([place]) 11/05/2013  . Seizure disorder ([place]) 11/04/2013  . Protein-calorie malnutrition, severe ([place]) 07/12/2013  . Acute respiratory failure ([place]) 07/10/2013  . Status epilepticus ([place]) 07/10/2013  . HTN (hypertension) 07/10/2013  . Acute blood loss anemia 05/15/2013  . Hematemesis 05/15/2013  . Volume excess 09/20/2012  . Abnormal EKG 09/20/2012  . Drug abuse 09/20/2012  . Elevated troponin 09/20/2012  . Anemia in chronic kidney disease 09/20/2012  . Cocaine abuse 06/12/2012  . End stage renal disease ([place]) 01/11/2012  . Tobacco abuse 11/04/2011  . Dental caries  11/04/2011  . Color blindness 11/04/2011    Past Surgical History:  Procedure Laterality Date  . AV FISTULA PLACEMENT  11/29/2011   Procedure: ARTERIOVENOUS (AV) FISTULA CREATION;  Surgeon: Angelia Mould, MD;  Location: [facility] OR;  Service: Vascular;  Laterality: Left;  . ESOPHAGOGASTRODUODENOSCOPY N/A 05/17/2013   Procedure: ESOPHAGOGASTRODUODENOSCOPY (EGD);  Surgeon: Beryle Beams, MD;  Location[facility] ENDOSCOPY;  Service: Endoscopy;   Laterality: N/A;  . INGUINAL HERNIA REPAIR Bilateral 1967  . RENAL BIOPSY  11/07/2011       Home Medications    Prior to Admission medications   Medication Sig Start Date End Date Taking? Authorizing Provider  acetaminophen (TYLENOL) 500 MG tablet Take 1,000 mg by mouth daily at 6 (six) AM. 6 times a day, 500 mg, 12 to 24 tabs a day, 6,000 to 12,000 mg daily for pain   Yes Historical Provider, MD  amLODipine (NORVASC) 10 MG tablet Take 1 tablet (10 mg total) by mouth daily. 01/25/14  Yes Nino Glow McLean-Scocuzza, MD  aspirin 81 MG chewable tablet Chew 1 tablet (81 mg total) by mouth daily. 01/25/14  Yes Nino Glow McLean-Scocuzza, MD  calcium carbonate (TUMS - DOSED IN MG ELEMENTAL CALCIUM) 500 MG chewable tablet Chew 500 mg by mouth daily as needed for indigestion or heartburn.    Yes Historical Provider, MD  carvedilol (COREG) 25 MG tablet Take 1.5 tablets (37.5 mg total) by mouth 2 (two) times daily with a meal. On Monday-Wednesday-Friday-Sunday (non-HD days); on dialysis days (Tuesday-Thursday-Saturday) take it once a day (evening time only). 01/30/14  Yes Mihai Croitoru, MD  cinacalcet (SENSIPAR) 30 MG tablet Take 60 mg by mouth daily.    Yes Historical Provider, MD  cloNIDine (CATAPRES) 0.3 MG tablet Take 0.3 mg by mouth 2 (two) times daily.   Yes Historical Provider, MD  diphenhydrAMINE (BENADRYL) 25 MG tablet Take 25 mg by mouth every 6 (six) hours as needed for itching.   Yes Historical Provider, MD  hydrALAZINE (APRESOLINE) 25 MG tablet Take 25 mg by mouth 3 (three) times daily. 01/31/16  Yes Historical Provider, MD  hydrOXYzine (ATARAX/VISTARIL) 25 MG tablet Take 25 mg by mouth 3 (three) times daily as needed for itching.  06/10/15  Yes Historical Provider, MD  levETIRAcetam (KEPPRA) 1000 MG tablet TAKE 1 TABLET BY MOUTH 2 TIMES DAILY. 02/08/16  Yes Marcial Pacas, MD  losartan (COZAAR) 100 MG tablet Take 100 mg by mouth at bedtime.   Yes Historical Provider, MD  multivitamin (RENA-VIT) TABS  tablet Take 1 tablet by mouth at bedtime. 11/29/11  Yes Alric Seton, PA-C  pantoprazole (PROTONIX) 20 MG tablet Take 20 mg by mouth daily. 02/21/16  Yes Historical Provider, MD  SENSIPAR 90 MG tablet Take 90 mg by mouth daily. 02/03/16  Yes Historical Provider, MD  sevelamer carbonate (RENVELA) 800 MG tablet Take 2 tablets (1,600 mg total) by mouth 3 (three) times daily with meals. 01/25/14  Yes Nino Glow McLean-Scocuzza, MD  cloNIDine (CATAPRES) 0.2 MG tablet Take 1 tablet (0.2 mg total) by mouth 2 (two) times daily. Patient not taking: Reported on 02/23/2016 11/08/13   Reyne Dumas, MD  Nutritional Supplements (FEEDING SUPPLEMENT, NEPRO CARB STEADY,) LIQD Take 237 mLs by mouth 3 (three) times daily as needed (Supplement). Patient not taking: Reported on 02/23/2016 05/17/13   Velta Addison Mikhail, DO  pantoprazole (PROTONIX) 40 MG tablet Take 1 tablet (40 mg total) by mouth daily. Patient not taking: Reported on 02/23/2016 01/25/14   Nino Glow McLean-Scocuzza, MD  Family History Family History  Problem Relation Age of Onset  . Heart attack Father     Social History Social History  Substance Use Topics  . Smoking status: Current Every Day Smoker    Packs/day: 1.00    Years: 35.00    Types: Cigars, Cigarettes  . Smokeless tobacco: Never Used  . Alcohol use No     Comment: 05/15/2013 "aien't drank in ~ 25 yrs"     Allergies   Review of patient's allergies indicates no known allergies.   Review of Systems Review of Systems All other systems negative except as documented in the HPI. All pertinent positives and negatives as reviewed in the HPI.  Physical Exam Updated Vital Signs BP (!) 181/108   Pulse 100   Temp 97.5 F (36.4 C) (Axillary)   Resp 18   Wt 77.4 kg   SpO2 96%   BMI 23.14 kg/m   Physical Exam  Constitutional: He is oriented to person, place, and time. He appears well-developed and well-nourished. No distress.  HENT:  Head: Normocephalic and atraumatic.    Mouth/Throat: Oropharynx is clear and moist.  Eyes: Pupils are equal, round, and reactive to light.  Neck: Normal range of motion. Neck supple.  Cardiovascular: Normal rate, regular rhythm and normal heart sounds.  Exam reveals no gallop and no friction rub.   No murmur heard. Pulmonary/Chest: He is in respiratory distress. He has no wheezes. He has rales. He exhibits no tenderness.  Abdominal: Soft. Bowel sounds are normal. He exhibits no distension. There is no tenderness.  Musculoskeletal: He exhibits edema.  Neurological: He is alert and oriented to person, place, and time. He exhibits normal muscle tone. Coordination normal.  Skin: Skin is warm and dry. No rash noted. No erythema.  Psychiatric: He has a normal mood and affect. His behavior is normal.  Nursing note and vitals reviewed.    ED Treatments / Results  Labs (all labs ordered are listed, but only abnormal results are displayed) Labs Reviewed  COMPREHENSIVE METABOLIC PANEL - Abnormal; Notable for the following:       Result Value   Sodium 133 (*)    Potassium >7.5 (*)    Chloride 93 (*)    CO2 17 (*)    BUN 147 (*)    Creatinine, Ser 19.36 (*)    GFR calc non Af Amer 2 (*)    GFR calc Af Amer 3 (*)    Anion gap 23 (*)    All other components within normal limits  CBC WITH DIFFERENTIAL/PLATELET - Abnormal; Notable for the following:    RBC 2.99 (*)    Hemoglobin 9.2 (*)    HCT 29.6 (*)    RDW 16.6 (*)    Neutro Abs 8.0 (*)    All other components within normal limits  I-STAT CHEM 8, ED - Abnormal; Notable for the following:    Sodium 130 (*)    Potassium 8.2 (*)    Chloride 100 (*)    BUN >140 (*)    Creatinine, Ser >18.00 (*)    Calcium, Ion 1.06 (*)    Hemoglobin 10.5 (*)    HCT 31.0 (*)    All other components within normal limits  HEPATITIS B SURFACE ANTIGEN  I-STAT TROPOININ, ED    EKG  EKG Interpretation None       Radiology Dg Chest Portable 1 View  Result Date: 02/23/2016 CLINICAL  DATA:  Shortness of breath.  Missed dialysis today. EXAM: PORTABLE CHEST  1 VIEW COMPARISON:  07/25/2015 FINDINGS: Interstitial and airspace densities in both lungs, particularly in the lower lungs. Findings are most compatible with pulmonary edema. Central vascular structures are also enlarged. Heart size is mildly enlarged. The trachea is midline. Cannot exclude small pleural effusions. Multiple small calcified granulomas in the right upper lung. IMPRESSION: Pulmonary edema. Mild cardiomegaly. Cannot exclude small pleural effusions. Electronically Signed   By: Markus Daft M.D.   On: 02/23/2016 11:57    Procedures Procedures (including critical care time)  Medications Ordered in ED Medications  insulin aspart (novoLOG) injection 5 Units (not administered)  dextrose 50 % solution 50 mL (not administered)  pentafluoroprop-tetrafluoroeth (GEBAUERS) aerosol 1 application (not administered)  0.9 %  sodium chloride infusion (not administered)  0.9 %  sodium chloride infusion (not administered)  ferric gluconate (NULECIT) 125 mg in sodium chloride 0.9 % 100 mL IVPB (not administered)  doxercalciferol (HECTOROL) injection 3 mcg (not administered)  acetaminophen (TYLENOL) 325 MG tablet (not administered)  albuterol (PROVENTIL) (2.5 MG/3ML) 0.083% nebulizer solution 5 mg (5 mg Nebulization Given 02/23/16 1157)  calcium gluconate 1 g in sodium chloride 0.9 % 100 mL IVPB (1 g Intravenous New Bag/Given 02/23/16 1217)  acetaminophen (TYLENOL) tablet 650 mg (650 mg Oral Given 02/23/16 1604)     Initial Impression / Assessment and Plan / ED Course  I have reviewed the triage vital signs and the nursing notes.  Pertinent labs & imaging results that were available during my care of the patient were reviewed by me and considered in my medical decision making (see chart for details).  Clinical Course    I spoke with the nephrologist on-call, who will evaluate the patient for dialysis.  Also spoke with the  hospitalist, who states that if they need admission to have the nephrologist called them.  Patient was stabilized here in the emergency department and then sent for dialysis  Final Clinical Impressions(s) / ED Diagnoses   Final diagnoses:  None    New Prescriptions New Prescriptions   No medications on file     Dalia Heading, PA-C 02/23/16 [place], MD 02/28/16 [phone]

## 2016-02-23 NOTE — H&P (Signed)
History and Physical    Jeremy Mccoy XBL:[id number] DOB: 1965/09/27 DOA: 02/23/2016  PCP: Leamon Arnt, MD  Patient coming from: Home  Chief Complaint: Dyspnea  HPI: Jeremy Mccoy is a 50 y.o. male with medical history significant of ESRD on dialysis, hypertension, seizures, substance abuse. Patient states that he missed dialysis yesterday because he came in late from out of town. Overnight he developed significant dyspnea and orthopnea and decided to come to the emergency department for evaluation and dialysis.  ED Course: Vitals: Hypertensive in urgency range. BiPAP initially Labs: Potassium up to 8.2. BUN/creatinine above detectable range. Potassium of 130. Hemoglobin of 10.5 Imaging: CXR with mild pleural effusion Medications/Course: Albuterol, Calcium gluconate, D50, Dialysis  Review of Systems: Review of Systems  Constitutional: Negative for chills and fever.  Respiratory: Positive for shortness of breath and wheezing. Negative for cough and sputum production.   Cardiovascular: Positive for orthopnea, leg swelling and PND. Negative for chest pain.  Gastrointestinal: Negative for abdominal pain, constipation, diarrhea, nausea and vomiting.  Neurological: Positive for headaches.  All other systems reviewed and are negative.   Past Medical History:  Diagnosis Date  . Anemia   . Anxiety   . Arthritis    "qwhere" (05/15/2013)  . COPD (chronic obstructive pulmonary disease) ([place])   . Crack cocaine use   . Dental caries   . Depression   . End stage renal disease ([place]) 01/11/2012   Patient presented 11/04/11 to hospital with CP, wt loss and N/V. Creat was 10, admitted. 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 ANA was + 1:80, +MPO and +pr3 Ab's (ANCA). Received plasmapheresis x 7, IV cytoxan and pred taper. First HD was 11/17/11. 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 Pt d/c'd to do outpt HD and may have gotten 1-2 additional Cytoxan as OP, but this was stopped eventually since renal function didn't recover. Now gets HD TTS schedule at [facility].  L forearm AVF 11/19/11 by Dr. Scot Dock is current access.    Marland Kitchen ESRD (end stage renal disease) on dialysis [facility])    TTS: [address] [place] (05/15/2013)  . ESRD (end stage renal disease) on dialysis ([place])   . Headache(784.0)    "get it from going to dialysis; when they get real bad I come to hospital" (05/15/2013)  . Headache(784.0)   . Hematemesis/vomiting blood   . History of blood transfusion 10/2011; 04/2013  . HTN (hypertension) 06/12/2012  . Renal insufficiency   . Shortness of breath    "just when I have too much potassium is too high" (05/15/2013)    Past Surgical History:  Procedure Laterality Date  . AV FISTULA PLACEMENT  11/29/2011   Procedure: ARTERIOVENOUS (AV) FISTULA CREATION;  Surgeon: Angelia Mould, MD;  Location: [facility] OR;  Service: Vascular;  Laterality: Left;  . ESOPHAGOGASTRODUODENOSCOPY N/A 05/17/2013   Procedure: ESOPHAGOGASTRODUODENOSCOPY (EGD);  Surgeon: Beryle Beams, MD;  Location[facility] ENDOSCOPY;  Service: Endoscopy;  Laterality: N/A;  . INGUINAL HERNIA REPAIR Bilateral 1967  . RENAL BIOPSY  11/07/2011     reports that he has been smoking Cigars and Cigarettes.  He has a 35.00 pack-year smoking history. He has never used smokeless tobacco. He reports that he uses drugs, including Marijuana and Cocaine. He reports that he does not drink alcohol.  No Known Allergies  Family History  Problem Relation Age of Onset  .  Heart attack Father     Prior to Admission medications   Medication Sig Start Date End Date Taking? Authorizing Provider  acetaminophen (TYLENOL) 500 MG tablet Take 1,000 mg by mouth daily at 6 (six) AM. 6 times a day, 500 mg, 12 to 24 tabs a day, 6,000 to 12,000 mg daily for pain   Yes Historical Provider, MD  amLODipine (NORVASC)  10 MG tablet Take 1 tablet (10 mg total) by mouth daily. 01/25/14  Yes Nino Glow McLean-Scocuzza, MD  aspirin 81 MG chewable tablet Chew 1 tablet (81 mg total) by mouth daily. 01/25/14  Yes Nino Glow McLean-Scocuzza, MD  calcium carbonate (TUMS - DOSED IN MG ELEMENTAL CALCIUM) 500 MG chewable tablet Chew 500 mg by mouth daily as needed for indigestion or heartburn.    Yes Historical Provider, MD  carvedilol (COREG) 25 MG tablet Take 1.5 tablets (37.5 mg total) by mouth 2 (two) times daily with a meal. On Monday-Wednesday-Friday-Sunday (non-HD days); on dialysis days (Tuesday-Thursday-Saturday) take it once a day (evening time only). 01/30/14  Yes Mihai Croitoru, MD  cinacalcet (SENSIPAR) 30 MG tablet Take 60 mg by mouth daily.    Yes Historical Provider, MD  cloNIDine (CATAPRES) 0.3 MG tablet Take 0.3 mg by mouth 2 (two) times daily.   Yes Historical Provider, MD  diphenhydrAMINE (BENADRYL) 25 MG tablet Take 25 mg by mouth every 6 (six) hours as needed for itching.   Yes Historical Provider, MD  hydrALAZINE (APRESOLINE) 25 MG tablet Take 25 mg by mouth 3 (three) times daily. 01/31/16  Yes Historical Provider, MD  hydrOXYzine (ATARAX/VISTARIL) 25 MG tablet Take 25 mg by mouth 3 (three) times daily as needed for itching.  06/10/15  Yes Historical Provider, MD  levETIRAcetam (KEPPRA) 1000 MG tablet TAKE 1 TABLET BY MOUTH 2 TIMES DAILY. 02/08/16  Yes Marcial Pacas, MD  losartan (COZAAR) 100 MG tablet Take 100 mg by mouth at bedtime.   Yes Historical Provider, MD  multivitamin (RENA-VIT) TABS tablet Take 1 tablet by mouth at bedtime. 11/29/11  Yes Alric Seton, PA-C  pantoprazole (PROTONIX) 20 MG tablet Take 20 mg by mouth daily. 02/21/16  Yes Historical Provider, MD  SENSIPAR 90 MG tablet Take 90 mg by mouth daily. 02/03/16  Yes Historical Provider, MD  sevelamer carbonate (RENVELA) 800 MG tablet Take 2 tablets (1,600 mg total) by mouth 3 (three) times daily with meals. 01/25/14  Yes Nino Glow McLean-Scocuzza, MD    cloNIDine (CATAPRES) 0.2 MG tablet Take 1 tablet (0.2 mg total) by mouth 2 (two) times daily. Patient not taking: Reported on 02/23/2016 11/08/13   Reyne Dumas, MD  Nutritional Supplements (FEEDING SUPPLEMENT, NEPRO CARB STEADY,) LIQD Take 237 mLs by mouth 3 (three) times daily as needed (Supplement). Patient not taking: Reported on 02/23/2016 05/17/13   Velta Addison Mikhail, DO  pantoprazole (PROTONIX) 40 MG tablet Take 1 tablet (40 mg total) by mouth daily. Patient not taking: Reported on 02/23/2016 01/25/14   Nino Glow McLean-Scocuzza, MD    Physical Exam: Vitals:   02/23/16 1530 02/23/16 1545 02/23/16 1605 02/23/16 1642  BP: (!) 180/113 (!) 181/108 (!) 177/114   Pulse: 100 100 92   Resp: 20 18 20    Temp:   98 F (36.7 C)   TempSrc:   Oral   SpO2: 96% 96% 96% 95%  Weight:   74.4 kg (164 lb 0.4 oz)      Constitutional: NAD, calm, comfortable.  Eyes: PERRL, lids and conjunctivae normal ENMT: Mucous membranes are moist. Posterior pharynx  clear of any exudate or lesions. Neck: normal, supple, no masses, no thyromegaly Respiratory: crackles at bases bilaterally, no wheezing. Normal respiratory effort. No accessory muscle use. Speaking in full sentences Cardiovascular: Regular rate and rhythm, no murmurs / rubs / gallops. No extremity edema. 2+ pedal pulses. Abdomen: no tenderness, no masses palpated. Bowel sounds positive.  Musculoskeletal: no clubbing / cyanosis. No joint deformity upper and lower extremities. Good ROM, no contractures. Normal muscle tone. 1+ edema Skin: no rashes, lesions, ulcers. No induration Neurologic: CN 2-12 grossly intact. Sensation intact, DTR normal. Strength 5/5 in all 4.  Psychiatric: Normal judgment and insight. Alert and oriented x 3. Normal mood.   Labs on Admission: I have personally reviewed following labs and imaging studies  CBC:  Recent Labs Lab 02/23/16 1129 02/23/16 1135  WBC 9.3  --   NEUTROABS 8.0*  --   HGB 9.2* 10.5*  HCT 29.6* 31.0*   MCV 99.0  --   PLT 227  --    Basic Metabolic Panel:  Recent Labs Lab 02/23/16 1129 02/23/16 1135  NA 133* 130*  K >7.5* 8.2*  CL 93* 100*  CO2 17*  --   GLUCOSE 96 92  BUN 147* >140*  CREATININE 19.36* >18.00*  CALCIUM 9.4  --    GFR: CrCl cannot be calculated (Unknown ideal weight.). Liver Function Tests:  Recent Labs Lab 02/23/16 1129  AST 15  ALT 22  ALKPHOS 120  BILITOT 0.6  PROT 7.6  ALBUMIN 4.1   No results for input(s): LIPASE, AMYLASE in the last 168 hours. No results for input(s): AMMONIA in the last 168 hours. Coagulation Profile: No results for input(s): INR, PROTIME in the last 168 hours. Cardiac Enzymes: No results for input(s): CKTOTAL, CKMB, CKMBINDEX, TROPONINI in the last 168 hours. BNP (last 3 results) No results for input(s): PROBNP in the last 8760 hours. HbA1C: No results for input(s): HGBA1C in the last 72 hours. CBG: No results for input(s): GLUCAP in the last 168 hours. Lipid Profile: No results for input(s): CHOL, HDL, LDLCALC, TRIG, CHOLHDL, LDLDIRECT in the last 72 hours. Thyroid Function Tests: No results for input(s): TSH, T4TOTAL, FREET4, T3FREE, THYROIDAB in the last 72 hours. Anemia Panel: No results for input(s): VITAMINB12, FOLATE, FERRITIN, TIBC, IRON, RETICCTPCT in the last 72 hours. Urine analysis:    Component Value Date/Time   COLORURINE YELLOW 11/04/2011 1410   APPEARANCEUR CLOUDY (A) 11/04/2011 1410   LABSPEC 1.014 11/04/2011 1410   PHURINE 5.0 11/04/2011 1410   GLUCOSEU NEGATIVE 11/04/2011 1410   HGBUR LARGE (A) 11/04/2011 1410   BILIRUBINUR NEGATIVE 11/04/2011 1410   KETONESUR NEGATIVE 11/04/2011 1410   PROTEINUR 100 (A) 11/04/2011 1410   UROBILINOGEN 0.2 11/04/2011 1410   NITRITE NEGATIVE 11/04/2011 1410   LEUKOCYTESUR TRACE (A) 11/04/2011 1410   Radiological Exams on Admission: Dg Chest Portable 1 View  Result Date: 02/23/2016 CLINICAL DATA:  Shortness of breath.  Missed dialysis today. EXAM:  PORTABLE CHEST 1 VIEW COMPARISON:  07/25/2015 FINDINGS: Interstitial and airspace densities in both lungs, particularly in the lower lungs. Findings are most compatible with pulmonary edema. Central vascular structures are also enlarged. Heart size is mildly enlarged. The trachea is midline. Cannot exclude small pleural effusions. Multiple small calcified granulomas in the right upper lung. IMPRESSION: Pulmonary edema. Mild cardiomegaly. Cannot exclude small pleural effusions. Electronically Signed   By: Markus Daft M.D.   On: 02/23/2016 11:57    EKG: Independently reviewed. Normal sinus rhythm. Peaked T waves. Prolonged PR. LBBB  Assessment/Plan Principal Problem:   Acute respiratory failure (HCC) Active Problems:   Tobacco abuse   Cocaine abuse   Anemia in chronic kidney disease   Essential hypertension   Protein-calorie malnutrition, severe (HCC)   ESRD on hemodialysis (HCC)   PAF (paroxysmal atrial fibrillation) (HCC)   Chronic diastolic congestive heart failure (HCC)   Hyperkalemia   Acute respiratory failure Secondary to fluid overload from missing dialysis. Improved with dialysis. Still on 2L [place] -repeat dialysis tomorrow -continue O2 via [place]  Hyperkalemia Needing emergent dialysis. Secondary to missing dialysis -nephrology recommendations -renal function panel in AM  End stage renal disease on hemodialysis MWF -dialysis per nephrology  Hypertension Hypertensive urgency Uncontrolled -continue amlodipine, clonidine, hydralazine, losartan, carvedilol  Protein calorie malnutrition, severe -Continue supplementation  Paroxysmal atrial fibrillation Appears in sinus on exam. Currently not on anticoagulation. CHA2DS2-VASc Score is 3. Listed as a problem but cannot find documentation. Closest thing is a history of SVT in 2015 which did not look to be atrial fibrillation. Rate is controlled -continue coreg  Seizures -continue Keppra  Chronic diastolic heart failure Likely  playing a part in symptoms. -dialysis  DVT prophylaxis: Heparin Code Status: Full code Family Communication: None at bedside Disposition Plan: Likely discharge home tomorrow Consults called: Nephrology Admission status: Observation, telemetry   Cordelia Poche, MD [facility] Pager [phone]  If 7PM-7AM, please contact night-coverage [email or web address] Password [facility]  02/23/2016, 6:46 PM

## 2016-02-23 NOTE — ED Triage Notes (Signed)
Pt here from home on cpap after missing dialysis today , sats were 85 % by ems , pt arrived on cpap

## 2016-02-23 NOTE — Procedures (Signed)
I have seen pt o HD: Pt presented to the ED (after missing HD on the 9th) d/t SOB Required BIPAP Labs as below:   Recent Labs  02/23/16 1129 02/23/16 1135  NA 133* 130*  K >7.5* 8.2*  CL 93* 100*  CO2 17*  --   GLUCOSE 96 92  BUN 147* >140*  CREATININE 19.36* >18.00*  CALCIUM 9.4  --    Liver Function Tests:  Recent Labs  02/23/16 1129  AST 15  ALT 22  ALKPHOS 120  BILITOT 0.6  PROT 7.6  ALBUMIN 4.1    Recent Labs  02/23/16 1129 02/23/16 1135  WBC 9.3  --   NEUTROABS 8.0*  --   HGB 9.2* 10.5*  HCT 29.6* 31.0*  MCV 99.0  --   PLT 227  --    On HD now via AVF Plan 1K bath for entire treatment EDW 69.5 and wt 77.4 kg - will try for 5 kg Will need another treatment tomorrow  Jamal Maes, MD [place] Pager 02/23/2016, 1:34 PM

## 2016-02-23 NOTE — ED Notes (Signed)
Pt transported to Dialysis on bipap , report given to RN  At dialysis

## 2016-02-23 NOTE — Consult Note (Signed)
[facility] Renal Consultation Note    Indication for Consultation:  Management of ESRD/hemodialysis, anemia, hypertension/volume, and secondary hyperparathyroidism. PCP:  HPI: Jeremy Mccoy is a 50 y.o. male with ESRD, HTN, Hx seizure disorder, COPD who presented to the ED with dyspnea in the setting of missing his last dialysis. He required Bi-Pap to assist with breathing (O2 saturations were initially ~85%). CXR showed pulmonary edema. Labs done showed K of 8.2 ECG with prolonged PR. He was brought to the HD unit for emergent dialysis.  He dialyzes MWF at [facility], but often misses treatments and cuts his HD time. In the past 2 weeks, he missed 3 HD sessions (9/29, 10/2, 10/9) and cut his time on his remaining HD treatments (ran 3 hours out of 4:15 on 10/4 and 2:18hours on 10/6). He has not been reaching his outpatient EDW.  Currently on Bi-Pap and having issues speaking, but denies CP, N/V, diarrhea, or fever.   Past Medical History:  Diagnosis Date  . Anemia   . Anxiety   . Arthritis    "qwhere" (05/15/2013)  . COPD (chronic obstructive pulmonary disease) ([place])   . Crack cocaine use   . Dental caries   . Depression   . End stage renal disease ([place]) 01/11/2012   Patient presented 11/04/11 to hospital with CP, wt loss and N/V. Creat was 10, admitted. 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 ANA was + 1:80, +MPO and +pr3 Ab's (ANCA). Received plasmapheresis x 7, IV cytoxan and pred taper. First HD was 11/17/11. 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 Pt d/c'd to do outpt HD and may have gotten 1-2 additional Cytoxan as OP, but this was stopped eventually since renal function didn't recover. Now gets HD TTS schedule at [facility].  L forearm AVF 11/19/11 by Dr. Scot Dock is current access.    Marland Kitchen ESRD (end stage renal disease) on  dialysis [facility])    TTS: [address] [place] (05/15/2013)  . ESRD (end stage renal disease) on dialysis ([place])   . Headache(784.0)    "get it from going to dialysis; when they get real bad I come to hospital" (05/15/2013)  . Headache(784.0)   . Hematemesis/vomiting blood   . History of blood transfusion 10/2011; 04/2013  . HTN (hypertension) 06/12/2012  . Renal insufficiency   . Shortness of breath    "just when I have too much potassium is too high" (05/15/2013)   Past Surgical History:  Procedure Laterality Date  . AV FISTULA PLACEMENT  11/29/2011   Procedure: ARTERIOVENOUS (AV) FISTULA CREATION;  Surgeon: Angelia Mould, MD;  Location: [facility] OR;  Service: Vascular;  Laterality: Left;  . ESOPHAGOGASTRODUODENOSCOPY N/A 05/17/2013   Procedure: ESOPHAGOGASTRODUODENOSCOPY (EGD);  Surgeon: Beryle Beams, MD;  Location: [facility] ENDOSCOPY;  Service: Endoscopy;  Laterality: N/A;  . INGUINAL HERNIA REPAIR Bilateral 1967  . RENAL BIOPSY  11/07/2011   Family History  Problem Relation Age of Onset  . Heart attack Father    Social History:  reports that he has been smoking Cigars and Cigarettes.  He has a 35.00 pack-year smoking history. He has never used smokeless tobacco. He reports that he uses drugs, including Marijuana and Cocaine. He reports that he does not drink alcohol. No Known Allergies Prior to Admission medications   Medication Sig Start Date End Date Taking? Authorizing Provider  acetaminophen (TYLENOL) 500 MG  tablet Take 1,000 mg by mouth daily at 6 (six) AM. 6 times a day, 500 mg, 12 to 24 tabs a day, 6,000 to 12,000 mg daily for pain   Yes Historical Provider, MD  amLODipine (NORVASC) 10 MG tablet Take 1 tablet (10 mg total) by mouth daily. 01/25/14  Yes Nino Glow McLean-Scocuzza, MD  aspirin 81 MG chewable tablet Chew 1 tablet (81 mg total) by mouth daily. 01/25/14  Yes Nino Glow McLean-Scocuzza, MD  calcium carbonate (TUMS - DOSED IN MG ELEMENTAL CALCIUM) 500 MG chewable tablet Chew 500  mg by mouth daily as needed for indigestion or heartburn.    Yes Historical Provider, MD  carvedilol (COREG) 25 MG tablet Take 1.5 tablets (37.5 mg total) by mouth 2 (two) times daily with a meal. On Monday-Wednesday-Friday-Sunday (non-HD days); on dialysis days (Tuesday-Thursday-Saturday) take it once a day (evening time only). 01/30/14  Yes Mihai Croitoru, MD  cinacalcet (SENSIPAR) 30 MG tablet Take 60 mg by mouth daily.    Yes Historical Provider, MD  cloNIDine (CATAPRES) 0.3 MG tablet Take 0.3 mg by mouth 2 (two) times daily.   Yes Historical Provider, MD  diphenhydrAMINE (BENADRYL) 25 MG tablet Take 25 mg by mouth every 6 (six) hours as needed for itching.   Yes Historical Provider, MD  hydrALAZINE (APRESOLINE) 25 MG tablet Take 25 mg by mouth 3 (three) times daily. 01/31/16  Yes Historical Provider, MD  hydrOXYzine (ATARAX/VISTARIL) 25 MG tablet Take 25 mg by mouth 3 (three) times daily as needed for itching.  06/10/15  Yes Historical Provider, MD  levETIRAcetam (KEPPRA) 1000 MG tablet TAKE 1 TABLET BY MOUTH 2 TIMES DAILY. 02/08/16  Yes Marcial Pacas, MD  losartan (COZAAR) 100 MG tablet Take 100 mg by mouth at bedtime.   Yes Historical Provider, MD  multivitamin (RENA-VIT) TABS tablet Take 1 tablet by mouth at bedtime. 11/29/11  Yes Alric Seton, PA-C  pantoprazole (PROTONIX) 20 MG tablet Take 20 mg by mouth daily. 02/21/16  Yes Historical Provider, MD  SENSIPAR 90 MG tablet Take 90 mg by mouth daily. 02/03/16  Yes Historical Provider, MD  sevelamer carbonate (RENVELA) 800 MG tablet Take 2 tablets (1,600 mg total) by mouth 3 (three) times daily with meals. 01/25/14  Yes Nino Glow McLean-Scocuzza, MD  cloNIDine (CATAPRES) 0.2 MG tablet Take 1 tablet (0.2 mg total) by mouth 2 (two) times daily. Patient not taking: Reported on 02/23/2016 11/08/13   Reyne Dumas, MD  Nutritional Supplements (FEEDING SUPPLEMENT, NEPRO CARB STEADY,) LIQD Take 237 mLs by mouth 3 (three) times daily as needed (Supplement). Patient  not taking: Reported on 02/23/2016 05/17/13   Velta Addison Mikhail, DO  pantoprazole (PROTONIX) 40 MG tablet Take 1 tablet (40 mg total) by mouth daily. Patient not taking: Reported on 02/23/2016 01/25/14   Nino Glow McLean-Scocuzza, MD   Current Facility-Administered Medications  Medication Dose Route Frequency Provider Last Rate Last Dose  . dextrose 50 % solution 50 mL  1 ampule Intravenous Once [employer], PA-C      . insulin aspart (novoLOG) injection 5 Units  5 Units Intravenous Once Dalia Heading, PA-C       Current Outpatient Prescriptions  Medication Sig Dispense Refill  . acetaminophen (TYLENOL) 500 MG tablet Take 1,000 mg by mouth daily at 6 (six) AM. 6 times a day, 500 mg, 12 to 24 tabs a day, 6,000 to 12,000 mg daily for pain    . amLODipine (NORVASC) 10 MG tablet Take 1 tablet (10 mg total) by mouth daily.  30 tablet 1  . aspirin 81 MG chewable tablet Chew 1 tablet (81 mg total) by mouth daily. 30 tablet 11  . calcium carbonate (TUMS - DOSED IN MG ELEMENTAL CALCIUM) 500 MG chewable tablet Chew 500 mg by mouth daily as needed for indigestion or heartburn.     . carvedilol (COREG) 25 MG tablet Take 1.5 tablets (37.5 mg total) by mouth 2 (two) times daily with a meal. On Monday-Wednesday-Friday-Sunday (non-HD days); on dialysis days (Tuesday-Thursday-Saturday) take it once a day (evening time only). 90 tablet 2  . cinacalcet (SENSIPAR) 30 MG tablet Take 60 mg by mouth daily.     . cloNIDine (CATAPRES) 0.3 MG tablet Take 0.3 mg by mouth 2 (two) times daily.    . diphenhydrAMINE (BENADRYL) 25 MG tablet Take 25 mg by mouth every 6 (six) hours as needed for itching.    . hydrALAZINE (APRESOLINE) 25 MG tablet Take 25 mg by mouth 3 (three) times daily.    . hydrOXYzine (ATARAX/VISTARIL) 25 MG tablet Take 25 mg by mouth 3 (three) times daily as needed for itching.   6  . levETIRAcetam (KEPPRA) 1000 MG tablet TAKE 1 TABLET BY MOUTH 2 TIMES DAILY. 60 tablet 0  . losartan (COZAAR) 100 MG  tablet Take 100 mg by mouth at bedtime.    . multivitamin (RENA-VIT) TABS tablet Take 1 tablet by mouth at bedtime.    . pantoprazole (PROTONIX) 20 MG tablet Take 20 mg by mouth daily.    . SENSIPAR 90 MG tablet Take 90 mg by mouth daily.    . sevelamer carbonate (RENVELA) 800 MG tablet Take 2 tablets (1,600 mg total) by mouth 3 (three) times daily with meals. 90 tablet 0  . cloNIDine (CATAPRES) 0.2 MG tablet Take 1 tablet (0.2 mg total) by mouth 2 (two) times daily. (Patient not taking: Reported on 02/23/2016) 60 tablet 2  . Nutritional Supplements (FEEDING SUPPLEMENT, NEPRO CARB STEADY,) LIQD Take 237 mLs by mouth 3 (three) times daily as needed (Supplement). (Patient not taking: Reported on 02/23/2016)  0  . pantoprazole (PROTONIX) 40 MG tablet Take 1 tablet (40 mg total) by mouth daily. (Patient not taking: Reported on 02/23/2016) 30 tablet 1   ROS: As per HPI otherwise negative.  Physical Exam: Vitals:   02/23/16 1157 02/23/16 1240 02/23/16 1250 02/23/16 1255  BP:  (!) 186/127 (!) 183/118 (!) 180/120  Pulse:  80 78 78  Resp:  18 18 18   Temp:  97.5 F (36.4 C)    TempSrc:  Axillary    SpO2: 99% 98% 99% 100%  Weight:  77.4 kg (170 lb 10.2 oz)       General: Well developed, well nourished, in mild respiratory distress on Bi-pap. Head: Normocephalic, atraumatic, sclera non-icteric, mucus membranes are moist. Neck: Supple without lymphadenopathy/masses. Lungs: Rhonchi throughout. Heart: RRR with normal S1, S2. No murmurs, rubs, or gallops appreciated. Abdomen: Soft, non-tender, non-distended with normoactive bowel sounds. No rebound/guarding. No obvious abdominal masses. Musculoskeletal:  Strength and tone appear normal for age. Lower extremities: No edema or ischemic changes, no open wounds. Neuro: Alert and oriented X 3. Moves all extremities spontaneously. Psych:  Responds to questions appropriately with a normal affect. Dialysis Access: LUE AVF + thrill/bruit  Recent Labs Lab  02/23/16 1129 02/23/16 1135  NA 133* 130*  K >7.5* 8.2*  CL 93* 100*  CO2 17*  --   GLUCOSE 96 92  BUN 147* >140*  CREATININE 19.36* >18.00*  CALCIUM 9.4  --  Recent Labs Lab 02/23/16 1129  AST 15  ALT 22  ALKPHOS 120  BILITOT 0.6  PROT 7.6  ALBUMIN 4.1    Recent Labs Lab 02/23/16 1129 02/23/16 1135  WBC 9.3  --   NEUTROABS 8.0*  --   HGB 9.2* 10.5*  HCT 29.6* 31.0*  MCV 99.0  --   PLT 227  --    Studies/Results: Dg Chest Portable 1 View  Result Date: 02/23/2016 CLINICAL DATA:  Shortness of breath.  Missed dialysis today. EXAM: PORTABLE CHEST 1 VIEW COMPARISON:  07/25/2015 FINDINGS: Interstitial and airspace densities in both lungs, particularly in the lower lungs. Findings are most compatible with pulmonary edema. Central vascular structures are also enlarged. Heart size is mildly enlarged. The trachea is midline. Cannot exclude small pleural effusions. Multiple small calcified granulomas in the right upper lung. IMPRESSION: Pulmonary edema. Mild cardiomegaly. Cannot exclude small pleural effusions. Electronically Signed   By: Markus Daft M.D.   On: 02/23/2016 11:57   Dialysis Orders:  Center: [facility]:15 [facility], EDW 69.5kg, BFR 500/DFR A1.5, 2K/2Ca bath, AVF - No heparin - Mircera 147mcg IV q 2 weeks (last given 9/20) - Hectoral 48mcg IV q HD - Venofer 100mg  IV q HD (#2 out of 5 given)   Assessment/Plan: 1.  Pulmonary edema: He is about 8kg over outpatient EDW. 5L UF with HD today and will bring back again tomorrow for further fluid removal. 2.  Hyperkalemia: 1K bath today with HD for correction. Will repeat afterwards. 3.  ESRD: As above. HD today for volume/high K, then back tomorrow for usual MWF schedule. 4.  Hypertension/volume: As above. 5L UF goal today. 5.  Anemia: Hgb 10.5. Continue course of IV iron (3 more doses). 6.  Metabolic bone disease: Ca 9.4. Continue binders/VDRA/sensipar. Check Phos tomorrow. 7.  Seizure  disorder: Per primary.  Veneta Penton, PA-C 02/23/2016, 1:39 PM  [facility] Pager: [phone]  I have seen and examined this patient and agree with plan and assessment in the above note with renal recommendations/intervention highlighted. Pt with missed HD, fluid overload, life threatening hyperkalemia. Plan for HD today on 1K bath, try for 5 kg today (close to 10 kg up) and remainder tomorrow. Currently breathing better, transitioned to nasal cannula.   Ketara Cavness B,MD 02/23/2016 2:39 PM

## 2016-02-24 ENCOUNTER — Observation Stay (HOSPITAL_COMMUNITY): Payer: Medicare Other

## 2016-02-24 DIAGNOSIS — Z8249 Family history of ischemic heart disease and other diseases of the circulatory system: Secondary | ICD-10-CM | POA: Diagnosis not present

## 2016-02-24 DIAGNOSIS — F1410 Cocaine abuse, uncomplicated: Secondary | ICD-10-CM | POA: Diagnosis present

## 2016-02-24 DIAGNOSIS — Z992 Dependence on renal dialysis: Secondary | ICD-10-CM | POA: Diagnosis not present

## 2016-02-24 DIAGNOSIS — Z7982 Long term (current) use of aspirin: Secondary | ICD-10-CM | POA: Diagnosis not present

## 2016-02-24 DIAGNOSIS — E8889 Other specified metabolic disorders: Secondary | ICD-10-CM | POA: Diagnosis present

## 2016-02-24 DIAGNOSIS — J9601 Acute respiratory failure with hypoxia: Secondary | ICD-10-CM

## 2016-02-24 DIAGNOSIS — F17210 Nicotine dependence, cigarettes, uncomplicated: Secondary | ICD-10-CM | POA: Diagnosis present

## 2016-02-24 DIAGNOSIS — E43 Unspecified severe protein-calorie malnutrition: Secondary | ICD-10-CM | POA: Diagnosis present

## 2016-02-24 DIAGNOSIS — I132 Hypertensive heart and chronic kidney disease with heart failure and with stage 5 chronic kidney disease, or end stage renal disease: Secondary | ICD-10-CM | POA: Diagnosis present

## 2016-02-24 DIAGNOSIS — I5032 Chronic diastolic (congestive) heart failure: Secondary | ICD-10-CM

## 2016-02-24 DIAGNOSIS — I480 Paroxysmal atrial fibrillation: Secondary | ICD-10-CM | POA: Diagnosis present

## 2016-02-24 DIAGNOSIS — I160 Hypertensive urgency: Secondary | ICD-10-CM | POA: Diagnosis present

## 2016-02-24 DIAGNOSIS — N186 End stage renal disease: Secondary | ICD-10-CM | POA: Diagnosis not present

## 2016-02-24 DIAGNOSIS — E875 Hyperkalemia: Secondary | ICD-10-CM | POA: Diagnosis not present

## 2016-02-24 DIAGNOSIS — G40909 Epilepsy, unspecified, not intractable, without status epilepticus: Secondary | ICD-10-CM | POA: Diagnosis not present

## 2016-02-24 DIAGNOSIS — J189 Pneumonia, unspecified organism: Secondary | ICD-10-CM | POA: Diagnosis present

## 2016-02-24 DIAGNOSIS — J9811 Atelectasis: Secondary | ICD-10-CM | POA: Diagnosis not present

## 2016-02-24 DIAGNOSIS — R0600 Dyspnea, unspecified: Secondary | ICD-10-CM | POA: Diagnosis present

## 2016-02-24 DIAGNOSIS — D631 Anemia in chronic kidney disease: Secondary | ICD-10-CM | POA: Diagnosis not present

## 2016-02-24 DIAGNOSIS — N2581 Secondary hyperparathyroidism of renal origin: Secondary | ICD-10-CM | POA: Diagnosis not present

## 2016-02-24 DIAGNOSIS — J440 Chronic obstructive pulmonary disease with acute lower respiratory infection: Secondary | ICD-10-CM | POA: Diagnosis present

## 2016-02-24 DIAGNOSIS — I5033 Acute on chronic diastolic (congestive) heart failure: Secondary | ICD-10-CM | POA: Diagnosis present

## 2016-02-24 DIAGNOSIS — J9600 Acute respiratory failure, unspecified whether with hypoxia or hypercapnia: Secondary | ICD-10-CM | POA: Diagnosis present

## 2016-02-24 DIAGNOSIS — I120 Hypertensive chronic kidney disease with stage 5 chronic kidney disease or end stage renal disease: Secondary | ICD-10-CM | POA: Diagnosis not present

## 2016-02-24 DIAGNOSIS — Z9119 Patient's noncompliance with other medical treatment and regimen: Secondary | ICD-10-CM | POA: Diagnosis not present

## 2016-02-24 DIAGNOSIS — F17290 Nicotine dependence, other tobacco product, uncomplicated: Secondary | ICD-10-CM | POA: Diagnosis present

## 2016-02-24 LAB — CBC
HCT: 26.1 % — ABNORMAL LOW (ref 39.0–52.0)
Hemoglobin: 8.4 g/dL — ABNORMAL LOW (ref 13.0–17.0)
MCH: 30.9 pg (ref 26.0–34.0)
MCHC: 32.2 g/dL (ref 30.0–36.0)
MCV: 96 fL (ref 78.0–100.0)
PLATELETS: 189 10*3/uL (ref 150–400)
RBC: 2.72 MIL/uL — ABNORMAL LOW (ref 4.22–5.81)
RDW: 16.4 % — AB (ref 11.5–15.5)
WBC: 5.5 10*3/uL (ref 4.0–10.5)

## 2016-02-24 LAB — RENAL FUNCTION PANEL
ALBUMIN: 3.4 g/dL — AB (ref 3.5–5.0)
ANION GAP: 17 — AB (ref 5–15)
Albumin: 3.7 g/dL (ref 3.5–5.0)
Anion gap: 18 — ABNORMAL HIGH (ref 5–15)
BUN: 85 mg/dL — AB (ref 6–20)
BUN: 95 mg/dL — AB (ref 6–20)
CHLORIDE: 95 mmol/L — AB (ref 101–111)
CO2: 23 mmol/L (ref 22–32)
CO2: 22 mmol/L (ref 22–32)
CREATININE: 13.21 mg/dL — AB (ref 0.61–1.24)
Calcium: 9.1 mg/dL (ref 8.9–10.3)
Calcium: 8.9 mg/dL (ref 8.9–10.3)
Chloride: 95 mmol/L — ABNORMAL LOW (ref 101–111)
Creatinine, Ser: 12.32 mg/dL — ABNORMAL HIGH (ref 0.61–1.24)
GFR calc Af Amer: 5 mL/min — ABNORMAL LOW (ref >60)
GFR calc Af Amer: 4 mL/min — ABNORMAL LOW (ref >60)
GFR calc non Af Amer: 4 mL/min — ABNORMAL LOW (ref >60)
GFR calc non Af Amer: 4 mL/min — ABNORMAL LOW (ref >60)
GLUCOSE: 89 mg/dL (ref 65–99)
GLUCOSE: 78 mg/dL (ref 65–99)
PHOSPHORUS: 7 mg/dL — AB (ref 2.5–4.6)
POTASSIUM: 5.8 mmol/L — AB (ref 3.5–5.1)
POTASSIUM: 6 mmol/L — AB (ref 3.5–5.1)
Phosphorus: 6.7 mg/dL — ABNORMAL HIGH (ref 2.5–4.6)
Sodium: 135 mmol/L (ref 135–145)
Sodium: 135 mmol/L (ref 135–145)

## 2016-02-24 LAB — HEPATITIS B SURFACE ANTIGEN: Hepatitis B Surface Ag: NEGATIVE

## 2016-02-24 LAB — MRSA PCR SCREENING: MRSA BY PCR: NEGATIVE

## 2016-02-24 MED ORDER — ALTEPLASE 2 MG IJ SOLR: 2.0000 mg | Freq: Once | INTRAMUSCULAR | Status: DC | PRN

## 2016-02-24 MED ORDER — LIDOCAINE-PRILOCAINE 2.5-2.5 % EX CREA
1.0000 | TOPICAL_CREAM | CUTANEOUS | Status: DC | PRN
Start: 2016-02-24 — End: 2016-02-24

## 2016-02-24 MED ORDER — SODIUM CHLORIDE 0.9 % IV SOLN: 100.0000 mL | INTRAVENOUS | Status: DC | PRN

## 2016-02-24 MED ORDER — VANCOMYCIN HCL IN DEXTROSE 750-5 MG/150ML-% IV SOLN
750.0000 mg | INTRAVENOUS | Status: DC
  Administered 2016-02-24 – 2016-02-26 (×2): 750 mg via INTRAVENOUS
  Filled 2016-02-24 (×3): qty 150

## 2016-02-24 MED ORDER — DOXERCALCIFEROL 4 MCG/2ML IV SOLN
INTRAVENOUS | Status: AC
  Filled 2016-02-24: qty 2

## 2016-02-24 MED ORDER — VANCOMYCIN HCL 10 G IV SOLR
1500.0000 mg | Freq: Once | INTRAVENOUS | Status: AC
  Administered 2016-02-24: 1500 mg via INTRAVENOUS
  Filled 2016-02-24: qty 1500

## 2016-02-24 MED ORDER — LIDOCAINE HCL (PF) 1 % IJ SOLN: 5.0000 mL | INTRAMUSCULAR | Status: DC | PRN

## 2016-02-24 MED ORDER — HEPARIN SODIUM (PORCINE) 1000 UNIT/ML DIALYSIS: 1000.0000 [IU] | INTRAMUSCULAR | Status: DC | PRN

## 2016-02-24 MED ORDER — PIPERACILLIN-TAZOBACTAM 3.375 G IVPB
3.3750 g | Freq: Two times a day (BID) | INTRAVENOUS | Status: DC
  Administered 2016-02-24 – 2016-02-25 (×4): 3.375 g via INTRAVENOUS
  Filled 2016-02-24 (×6): qty 50

## 2016-02-24 NOTE — Progress Notes (Signed)
[facility] Progress Note   Subjective:  Seen in room prior to dialysis. Not currently wearing oxygen, but required it overnight. Denies CP, abdominal pain, N/V, fevers. Repeat CXR this morning showed improved edema, but concerning for pneumonia. He is now getting Vanc/Zosyn for this.  Objective Vitals:   02/23/16 1851 02/23/16 1853 02/23/16 2132 02/24/16 0525  BP:  (!) 174/107 (!) 189/106 (!) 185/119  Pulse:  90 (!) 102 93  Resp:  16 (!) 22 (!) 24  Temp:  98.5 F (36.9 C) (!) 101 F (38.3 C) 98.3 F (36.8 C)  TempSrc:  Oral Oral Oral  SpO2:  97% 91% 95%  Weight: 77.5 kg (170 lb 13.7 oz)     Height: 6' (1.829 m)      Physical Exam General: Well nourished male, NAD. Off nasal oxygen currently. Heart: RRR; 2/6 systolic murmur Lungs: Coarse air movement throughout without rales or wheezing. Abdomen: Soft, non-tender Extremities: 1+ pedal edema Dialysis Access: L forearm AVF + thrill/bruit   Recent Labs Lab 02/23/16 1129 02/23/16 1135 02/24/16 0432  NA 133* 130* 135  K >7.5* 8.2* 5.8*  CL 93* 100* 95*  CO2 17*  --  23  GLUCOSE 96 92 89  BUN 147* >140* 85*  CREATININE 19.36* >18.00* 12.32*  CALCIUM 9.4  --  9.1  PHOS  --   --  6.7*     Recent Labs Lab 02/23/16 1129 02/24/16 0432  AST 15  --   ALT 22  --   ALKPHOS 120  --   BILITOT 0.6  --   PROT 7.6  --   ALBUMIN 4.1 3.7     Recent Labs Lab 02/23/16 1129 02/23/16 1135  WBC 9.3  --   NEUTROABS 8.0*  --   HGB 9.2* 10.5*  HCT 29.6* 31.0*  MCV 99.0  --   PLT 227  --    Studies/Results: Dg Chest 2 View  Result Date: 02/24/2016 CLINICAL DATA:  Fever and weakness. History of COPD, end-stage renal disease, tobacco and cocaine abuse EXAM: CHEST  2 VIEW COMPARISON:  Portable chest x-ray of February 23, 2016 FINDINGS: The lungs remain hyperinflated. The pulmonary interstitial markings remain increased. Subpleural rounded 2-3 mm calcifications peripherally in the right upper lobe are stable.  There is improving left lower lobe atelectasis or pneumonia. The heart is top-normal in size. The pulmonary vascularity is engorged. The mediastinum is normal in width. There are small bilateral pleural effusions. The bony thorax exhibits no acute abnormality. IMPRESSION: Mild interval improvement in pulmonary interstitial edema. Persistent left lower lobe atelectasis or pneumonia with small bilateral pleural effusions. Previous granulomatous infection. Electronically Signed   By: David  Martinique M.D.   On: 02/24/2016 11:12   Dg Chest Portable 1 View  Result Date: 02/23/2016 CLINICAL DATA:  Shortness of breath.  Missed dialysis today. EXAM: PORTABLE CHEST 1 VIEW COMPARISON:  07/25/2015 FINDINGS: Interstitial and airspace densities in both lungs, particularly in the lower lungs. Findings are most compatible with pulmonary edema. Central vascular structures are also enlarged. Heart size is mildly enlarged. The trachea is midline. Cannot exclude small pleural effusions. Multiple small calcified granulomas in the right upper lung. IMPRESSION: Pulmonary edema. Mild cardiomegaly. Cannot exclude small pleural effusions. Electronically Signed   By: Markus Daft M.D.   On: 02/23/2016 11:57   Medications:   . amLODipine  10 mg Oral Daily  . aspirin  81 mg Oral Daily  . carvedilol  37.5 mg Oral BID WC  . cinacalcet  90 mg Oral QAC breakfast  . cloNIDine  0.3 mg Oral BID  . doxercalciferol  3 mcg Intravenous Q M,W,F-HD  . ferric gluconate (FERRLECIT/NULECIT) IV  125 mg Intravenous Q M,W,F-HD  . heparin  5,000 Units Subcutaneous Q8H  . hydrALAZINE  25 mg Oral TID  . levETIRAcetam  1,000 mg Oral BID  . multivitamin  1 tablet Oral QHS  . pantoprazole  20 mg Oral Daily  . piperacillin-tazobactam (ZOSYN)  IV  3.375 g Intravenous Q12H  . sevelamer carbonate  1,600 mg Oral TID WC  . sodium chloride flush  3 mL Intravenous Q12H  . vancomycin  750 mg Intravenous Q M,W,F-HD   Dialysis Orders: Center: MWF at [facility] ([place] [place] 4:15 hours, EDW 69.5kg, BFR 500/DFR A1.5, 2K/2Ca bath, AVF - No heparin - Mircera 142mcg IV q 2 weeks (last given 9/20) - Hectoral 63mcg IV q HD - Venofer 100mg  IV q HD (#2 out of 5 given)  Assessment/Plan: 1. Pulmonary edema: Presented around 8kg over outpatient EDW. Removed 3.5L with HD on 10/10. Planning for further 5L UF with HD today. 2. Hyperkalemia: K 8.2 on admit requiring emergent HD 10/10. K down to 5.8 this AM, 2K bath with HD 10/11. Continue to monitor. Low K diet discussed. 3. ?Pneumonia: Repeat CXR 10/11 concerning for LLL pneumonia. Now on Vanc/Zosyn. BCx pending. Dosing per pharmacy. 4.  ESRD: As above. Emergent HD 10/10 for volume/high K, back today per MWF schedule. 5.  Hypertension/volume: As above. BP high. Trying to get back to EDW with today's HD. 6.  Anemia: Hgb 10.5. Hold IV iron in light of infection. 7.  Metabolic bone disease: Ca 9.4, Phos 6.7. Continue binders/VDRA/sensipar. May need to increase binders soon.  8.  Seizure disorder: Per primary.   Veneta Penton, PA-C 02/24/2016, 12:41 PM  [facility] Pager: [phone]  I have seen and examined this patient and agree with plan and assessment in the above note with renal recommendations/intervention highlighted. For HD again today. ATB's for possible PNA. [place] Shaul B,MD 02/24/2016 1:11 PM

## 2016-02-24 NOTE — Progress Notes (Signed)
PROGRESS NOTE    Jeremy Mccoy  EYC:[id number] DOB: 1965-10-13 DOA: 02/23/2016 PCP: Leamon Arnt, MD    Brief Narrative:Jeremy Mccoy is a 50 y.o. male with medical history significant of ESRD on dialysis, hypertension, seizures, substance abuse. Patient states that he missed dialysis yesterday because he came in late from out of town. Overnight he developed significant dyspnea and orthopnea and decided to come to the emergency department for evaluation and dialysis.  ED Course: Vitals: Hypertensive in urgency range. BiPAP initially Labs: Potassium up to 8.2. BUN/creatinine above detectable range. Potassium of 130. Hemoglobin of 10.5 Imaging: CXR with mild pleural effusion Medications/Course: Albuterol, Calcium gluconate, D50, Dialysis    Assessment & Plan:   Principal Problem:   Acute respiratory failure (HCC) Active Problems:   Tobacco abuse   Cocaine abuse   Anemia in chronic kidney disease   Essential hypertension   Protein-calorie malnutrition, severe (HCC)   ESRD on hemodialysis (HCC)   PAF (paroxysmal atrial fibrillation) (HCC)   Chronic diastolic congestive heart failure (HCC)   Hyperkalemia  Acute respiratory failure Secondary to fluid overload from missing dialysis.underwent Emergent HD 10-10 -repeat dialysis today./  -continue O2 via  -Repeat Chest x ray.   Fever; 101 on 10-10/ Will order Blood culture.  Repeat Chest x ray.  Start Vanc and Zosyn to cover for infection.   Hyperkalemia Needing emergent dialysis. Secondary to missing dialysis Improving. Correction with HD>  Avoid Cozaar.   End stage renal disease on hemodialysis MWF -dialysis per nephrology  Hypertension Hypertensive urgency Uncontrolled -continue amlodipine, clonidine, hydralazine,  carvedilol -Discontinue Cozaar due to Hyperkalemia.   Protein calorie malnutrition, severe -Continue supplementation  History of Paroxysmal atrial fibrillation Appears in sinus on  exam. Currently not on anticoagulation. CHA2DS2-VASc Scoreis 3. Listed as a problem but cannot find documentation. Closest thing is a history of SVT in 2015 which did not look to be atrial fibrillation. Rate is controlled -continue coreg  Seizures -continue Keppra  Acute on Chronic diastolic heart failure exacerbation.  Pulmonary edema, due to missing HD.  -dialysis     DVT prophylaxis: Heparin Code Status: Full Code.  Family Communication:  Disposition Plan: Remain inpatient for treatment of Respiratory failure, Renal failure.   Consultants:   Nephrology    Procedures;  HD   Antimicrobials:  Vancomycin 10-11  Zosyn 10-11   Subjective: Report feeling better, breathing better.  Report productive cough for last few days.  Had BM this am, soft.    Objective: Vitals:   02/23/16 1851 02/23/16 1853 02/23/16 2132 02/24/16 0525  BP:  (!) 174/107 (!) 189/106 (!) 185/119  Pulse:  90 (!) 102 93  Resp:  16 (!) 22 (!) 24  Temp:  98.5 F (36.9 C) (!) 101 F (38.3 C) 98.3 F (36.8 C)  TempSrc:  Oral Oral Oral  SpO2:  97% 91% 95%  Weight: 77.5 kg (170 lb 13.7 oz)     Height: 6' (1.829 m)       Intake/Output Summary (Last 24 hours) at 02/24/16 0900 Last data filed at 02/24/16 0801  Gross per 24 hour  Intake               60 ml  Output             3010 ml  Net            -2950 ml   Filed Weights   02/23/16 1240 02/23/16 1605 02/23/16 1851  Weight: 77.4 kg (170 lb 10.2 oz)  74.4 kg (164 lb 0.4 oz) 77.5 kg (170 lb 13.7 oz)    Examination:  General exam: Appears calm and comfortable  Respiratory system: Bilateral crackles.  Respiratory effort normal. Cardiovascular system: S1 & S2 heard, RRR. No JVD, murmurs, rubs, gallops or clicks. No pedal edema. Gastrointestinal system: Abdomen is nondistended, soft and nontender. No organomegaly or masses felt. Normal bowel sounds heard. Central nervous system: Alert and oriented. No focal neurological  deficits. Extremities: Symmetric 5 x 5 power. Skin: No rashes, lesions or ulcers Psychiatry: Judgement and insight appear normal. Mood & affect appropriate.     Data Reviewed: I have personally reviewed following labs and imaging studies  CBC:  Recent Labs Lab 02/23/16 1129 02/23/16 1135  WBC 9.3  --   NEUTROABS 8.0*  --   HGB 9.2* 10.5*  HCT 29.6* 31.0*  MCV 99.0  --   PLT 227  --    Basic Metabolic Panel:  Recent Labs Lab 02/23/16 1129 02/23/16 1135 02/24/16 0432  NA 133* 130* 135  K >7.5* 8.2* 5.8*  CL 93* 100* 95*  CO2 17*  --  23  GLUCOSE 96 92 89  BUN 147* >140* 85*  CREATININE 19.36* >18.00* 12.32*  CALCIUM 9.4  --  9.1  PHOS  --   --  6.7*   GFR: Estimated Creatinine Clearance: 7.9 mL/min (by C-G formula based on SCr of 12.32 mg/dL (H)). Liver Function Tests:  Recent Labs Lab 02/23/16 1129 02/24/16 0432  AST 15  --   ALT 22  --   ALKPHOS 120  --   BILITOT 0.6  --   PROT 7.6  --   ALBUMIN 4.1 3.7   No results for input(s): LIPASE, AMYLASE in the last 168 hours. No results for input(s): AMMONIA in the last 168 hours. Coagulation Profile: No results for input(s): INR, PROTIME in the last 168 hours. Cardiac Enzymes: No results for input(s): CKTOTAL, CKMB, CKMBINDEX, TROPONINI in the last 168 hours. BNP (last 3 results) No results for input(s): PROBNP in the last 8760 hours. HbA1C: No results for input(s): HGBA1C in the last 72 hours. CBG: No results for input(s): GLUCAP in the last 168 hours. Lipid Profile: No results for input(s): CHOL, HDL, LDLCALC, TRIG, CHOLHDL, LDLDIRECT in the last 72 hours. Thyroid Function Tests: No results for input(s): TSH, T4TOTAL, FREET4, T3FREE, THYROIDAB in the last 72 hours. Anemia Panel: No results for input(s): VITAMINB12, FOLATE, FERRITIN, TIBC, IRON, RETICCTPCT in the last 72 hours. Sepsis Labs: No results for input(s): PROCALCITON, LATICACIDVEN in the last 168 hours.  No results found for this or any  previous visit (from the past 240 hour(s)).       Radiology Studies: Dg Chest Portable 1 View  Result Date: 02/23/2016 CLINICAL DATA:  Shortness of breath.  Missed dialysis today. EXAM: PORTABLE CHEST 1 VIEW COMPARISON:  07/25/2015 FINDINGS: Interstitial and airspace densities in both lungs, particularly in the lower lungs. Findings are most compatible with pulmonary edema. Central vascular structures are also enlarged. Heart size is mildly enlarged. The trachea is midline. Cannot exclude small pleural effusions. Multiple small calcified granulomas in the right upper lung. IMPRESSION: Pulmonary edema. Mild cardiomegaly. Cannot exclude small pleural effusions. Electronically Signed   By: Markus Daft M.D.   On: 02/23/2016 11:57        Scheduled Meds: . amLODipine  10 mg Oral Daily  . aspirin  81 mg Oral Daily  . carvedilol  37.5 mg Oral BID WC  . cinacalcet  90  mg Oral QAC breakfast  . cloNIDine  0.3 mg Oral BID  . doxercalciferol  3 mcg Intravenous Q M,W,F-HD  . ferric gluconate (FERRLECIT/NULECIT) IV  125 mg Intravenous Q M,W,F-HD  . heparin  5,000 Units Subcutaneous Q8H  . hydrALAZINE  25 mg Oral TID  . levETIRAcetam  1,000 mg Oral BID  . losartan  100 mg Oral QHS  . multivitamin  1 tablet Oral QHS  . pantoprazole  20 mg Oral Daily  . sevelamer carbonate  1,600 mg Oral TID WC  . sodium chloride flush  3 mL Intravenous Q12H   Continuous Infusions:    LOS: 0 days    Time spent: 35 minutes.     Elmarie Shiley, MD [facility] Pager [phone]  If 7PM-7AM, please contact night-coverage [email or web address] Password [email or web address] 02/24/2016, 9:00 AM

## 2016-02-24 NOTE — Procedures (Signed)
I have personally attended this patient's dialysis session.   5 L max goal 2K bath (K 5.8) Hopeful that he will stay on full time today  Jamal Maes, MD [place] Pager 02/24/2016, 2:31 PM

## 2016-02-24 NOTE — Progress Notes (Signed)
Pharmacy Antibiotic Note  Jeremy Mccoy is a 50 y.o. male admitted on 02/23/2016 with pneumonia.  Pharmacy has been consulted for vancomycin/Zosyn dosing.  Plan: Vancomycin 1500 x 1 dose, plan to give vancomycin 750 mg post HD MWF if tolerating full sessions. Target pre-HD levels 15-25 mcg/mL. Zosyn 3.375g IV q12h (4 hour infusion). Monitor clinical progress, c/s, renal function, abx plan/LOT Vancomycin levels as indicated   Height: 6' (182.9 cm) Weight: 170 lb 13.7 oz (77.5 kg) IBW/kg (Calculated) : 77.6  Temp (24hrs), Avg:98.4 F (36.9 C), Min:96.9 F (36.1 C), Max:101 F (38.3 C)   Recent Labs Lab 02/23/16 1129 02/23/16 1135 02/24/16 0432  WBC 9.3  --   --   CREATININE 19.36* >18.00* 12.32*    Estimated Creatinine Clearance: 7.9 mL/min (by C-G formula based on SCr of 12.32 mg/dL (H)).    No Known Allergies  Antimicrobials this admission: 10/11 vanc >>  10/11 Zosyn >>   Dose adjustments this admission: n/a  Microbiology results: 10/11 [facility] x2: sent   Thank you for allowing [place] to participate in this patients care. Jens Som, PharmD Pager: [phone] 02/24/2016 9:27 AM

## 2016-02-25 ENCOUNTER — Encounter (HOSPITAL_COMMUNITY): Payer: Self-pay | Admitting: General Practice

## 2016-02-25 LAB — CBC
HCT: 28.8 % — ABNORMAL LOW (ref 39.0–52.0)
Hemoglobin: 9.2 g/dL — ABNORMAL LOW (ref 13.0–17.0)
MCH: 31 pg (ref 26.0–34.0)
MCHC: 31.9 g/dL (ref 30.0–36.0)
MCV: 97 fL (ref 78.0–100.0)
PLATELETS: 196 10*3/uL (ref 150–400)
RBC: 2.97 MIL/uL — ABNORMAL LOW (ref 4.22–5.81)
RDW: 16 % — ABNORMAL HIGH (ref 11.5–15.5)
WBC: 6.3 10*3/uL (ref 4.0–10.5)

## 2016-02-25 LAB — RENAL FUNCTION PANEL
Albumin: 3.5 g/dL (ref 3.5–5.0)
Anion gap: 15 (ref 5–15)
BUN: 63 mg/dL — AB (ref 6–20)
CALCIUM: 9.7 mg/dL (ref 8.9–10.3)
CO2: 26 mmol/L (ref 22–32)
CREATININE: 10.14 mg/dL — AB (ref 0.61–1.24)
Chloride: 96 mmol/L — ABNORMAL LOW (ref 101–111)
GFR calc Af Amer: 6 mL/min — ABNORMAL LOW (ref >60)
GFR calc non Af Amer: 5 mL/min — ABNORMAL LOW (ref >60)
GLUCOSE: 92 mg/dL (ref 65–99)
Phosphorus: 7.5 mg/dL — ABNORMAL HIGH (ref 2.5–4.6)
Potassium: 5.3 mmol/L — ABNORMAL HIGH (ref 3.5–5.1)
SODIUM: 137 mmol/L (ref 135–145)

## 2016-02-25 MED ORDER — SEVELAMER CARBONATE 800 MG PO TABS
2400.0000 mg | ORAL_TABLET | Freq: Three times a day (TID) | ORAL | Status: DC
  Administered 2016-02-25 – 2016-02-26 (×2): 2400 mg via ORAL
  Filled 2016-02-25 (×2): qty 3

## 2016-02-25 NOTE — Progress Notes (Addendum)
PROGRESS NOTE    Jeremy Mccoy  VEH:[id number] DOB: 26-May-1965 DOA: 02/23/2016 PCP: Leamon Arnt, MD    Brief Narrative:Jeremy Mccoy is a 50 y.o. male with medical history significant of ESRD on dialysis, hypertension, seizures, substance abuse. Patient states that he missed dialysis yesterday because he came in late from out of town. Overnight he developed significant dyspnea and orthopnea and decided to come to the emergency department for evaluation and dialysis.  ED Course: Vitals: Hypertensive in urgency range. BiPAP initially Labs: Potassium up to 8.2. BUN/creatinine above detectable range. Potassium of 130. Hemoglobin of 10.5 Imaging: CXR with mild pleural effusion Medications/Course: Albuterol, Calcium gluconate, D50, Dialysis    Assessment & Plan:   Principal Problem:   Acute respiratory failure (HCC) Active Problems:   Tobacco abuse   Cocaine abuse   Anemia in chronic kidney disease   Essential hypertension   Protein-calorie malnutrition, severe (HCC)   ESRD on hemodialysis (HCC)   PAF (paroxysmal atrial fibrillation) (HCC)   Chronic diastolic congestive heart failure (HCC)   Hyperkalemia  Acute respiratory failure Secondary to fluid overload from missing dialysis.underwent Emergent HD 10-10 a,d component of diastolic HF exacerbation  -had dialysis 10-11 -continue O2 via [place] Improving.   PNA, treating for Health care associated.  Fever; 101 on 10-10/ Blood culture. No growth to date  Chest x ray with finding consistent with PNA Continue with Vancomycin and Zosyn day 2.   Hyperkalemia Needing emergent dialysis. Secondary to missing dialysis Improving. Correction with HD>  Avoid Cozaar.  improving.   End stage renal disease on hemodialysis MWF -dialysis per nephrology dialysis tomorrow.   Hypertension Hypertensive urgency -continue amlodipine, clonidine, hydralazine,  carvedilol -Discontinue Cozaar due to Hyperkalemia.   Protein calorie  malnutrition, severe -Continue supplementation  History of Paroxysmal atrial fibrillation Appears in sinus on exam. Currently not on anticoagulation. CHA2DS2-VASc Scoreis 3. Listed as a problem but cannot find documentation. Closest thing is a history of SVT in 2015 which did not look to be atrial fibrillation. Rate is controlled -continue coreg  Seizures -continue Keppra  Acute on Chronic diastolic heart failure exacerbation.  Pulmonary edema, due to missing HD. Combination.  -dialysis     DVT prophylaxis: Heparin Code Status: Full Code.  Family Communication:  Disposition Plan: home in 24 hours.   Consultants:   Nephrology    Procedures;  HD   Antimicrobials:  Vancomycin 10-11  Zosyn 10-11   Subjective: Feeling better. Cough improving. Still complaining go chronic headaches.    Objective: Vitals:   02/24/16 2145 02/25/16 0547 02/25/16 1027 02/25/16 1445  BP: (!) 169/103 (!) 152/100 (!) 151/110 (!) 154/93  Pulse: 94 83 81 86  Resp: 18 18  16   Temp: 98.7 F (37.1 C) 98.3 F (36.8 C)  97.9 F (36.6 C)  TempSrc: Oral Oral  Oral  SpO2: 92% 91% 94% 91%  Weight:      Height:        Intake/Output Summary (Last 24 hours) at 02/25/16 1600 Last data filed at 02/25/16 1500  Gross per 24 hour  Intake              683 ml  Output             3029 ml  Net            -2346 ml   Filed Weights   02/23/16 1851 02/24/16 1350 02/24/16 1646  Weight: 77.5 kg (170 lb 13.7 oz) 75.9 kg (167 lb 5.3 oz) 72.8  kg (160 lb 7.9 oz)    Examination:  General exam: Appears calm and comfortable  Respiratory system: Bilateral crackles.  Respiratory effort normal. Cardiovascular system: S1 & S2 heard, RRR. No JVD, murmurs, rubs, gallops or clicks. No pedal edema. Gastrointestinal system: Abdomen is nondistended, soft and nontender. No organomegaly or masses felt. Normal bowel sounds heard. Central nervous system: Alert and oriented. No focal neurological  deficits. Extremities: Symmetric 5 x 5 power. Skin: No rashes, lesions or ulcers Psychiatry: Judgement and insight appear normal. Mood & affect appropriate.     Data Reviewed: I have personally reviewed following labs and imaging studies  CBC:  Recent Labs Lab 02/23/16 1129 02/23/16 1135 02/24/16 1500 02/25/16 0455  WBC 9.3  --  5.5 6.3  NEUTROABS 8.0*  --   --   --   HGB 9.2* 10.5* 8.4* 9.2*  HCT 29.6* 31.0* 26.1* 28.8*  MCV 99.0  --  96.0 97.0  PLT 227  --  189 786   Basic Metabolic Panel:  Recent Labs Lab 02/23/16 1129 02/23/16 1135 02/24/16 0432 02/24/16 1500 02/25/16 0455  NA 133* 130* 135 135 137  K >7.5* 8.2* 5.8* 6.0* 5.3*  CL 93* 100* 95* 95* 96*  CO2 17*  --  23 22 26   GLUCOSE 96 92 89 78 92  BUN 147* >140* 85* 95* 63*  CREATININE 19.36* >18.00* 12.32* 13.21* 10.14*  CALCIUM 9.4  --  9.1 8.9 9.7  PHOS  --   --  6.7* 7.0* 7.5*   GFR: Estimated Creatinine Clearance: 9 mL/min (by C-G formula based on SCr of 10.14 mg/dL (H)). Liver Function Tests:  Recent Labs Lab 02/23/16 1129 02/24/16 0432 02/24/16 1500 02/25/16 0455  AST 15  --   --   --   ALT 22  --   --   --   ALKPHOS 120  --   --   --   BILITOT 0.6  --   --   --   PROT 7.6  --   --   --   ALBUMIN 4.1 3.7 3.4* 3.5   No results for input(s): LIPASE, AMYLASE in the last 168 hours. No results for input(s): AMMONIA in the last 168 hours. Coagulation Profile: No results for input(s): INR, PROTIME in the last 168 hours. Cardiac Enzymes: No results for input(s): CKTOTAL, CKMB, CKMBINDEX, TROPONINI in the last 168 hours. BNP (last 3 results) No results for input(s): PROBNP in the last 8760 hours. HbA1C: No results for input(s): HGBA1C in the last 72 hours. CBG: No results for input(s): GLUCAP in the last 168 hours. Lipid Profile: No results for input(s): CHOL, HDL, LDLCALC, TRIG, CHOLHDL, LDLDIRECT in the last 72 hours. Thyroid Function Tests: No results for input(s): TSH, T4TOTAL, FREET4,  T3FREE, THYROIDAB in the last 72 hours. Anemia Panel: No results for input(s): VITAMINB12, FOLATE, FERRITIN, TIBC, IRON, RETICCTPCT in the last 72 hours. Sepsis Labs: No results for input(s): PROCALCITON, LATICACIDVEN in the last 168 hours.  Recent Results (from the past 240 hour(s))  Culture, blood (routine x 2)     Status: None (Preliminary result)   Collection Time: 02/24/16  9:35 AM  Result Value Ref Range Status   Specimen Description BLOOD RIGHT HAND  Final   Special Requests BOTTLES DRAWN AEROBIC AND ANAEROBIC 5CC EACH  Final   Culture NO GROWTH 1 DAY  Final   Report Status PENDING  Incomplete  Culture, blood (routine x 2)     Status: None (Preliminary result)  Collection Time: 02/24/16  9:50 AM  Result Value Ref Range Status   Specimen Description BLOOD RIGHT ARM  Final   Special Requests BOTTLES DRAWN AEROBIC AND ANAEROBIC 5CC EACH  Final   Culture NO GROWTH 1 DAY  Final   Report Status PENDING  Incomplete  MRSA PCR Screening     Status: None   Collection Time: 02/24/16 12:10 PM  Result Value Ref Range Status   MRSA by PCR NEGATIVE NEGATIVE Final    Comment:        The GeneXpert MRSA Assay (FDA approved for NASAL specimens only), is one component of a comprehensive MRSA colonization surveillance program. It is not intended to diagnose MRSA infection nor to guide or monitor treatment for MRSA infections.          Radiology Studies: Dg Chest 2 View  Result Date: 02/24/2016 CLINICAL DATA:  Fever and weakness. History of COPD, end-stage renal disease, tobacco and cocaine abuse EXAM: CHEST  2 VIEW COMPARISON:  Portable chest x-ray of February 23, 2016 FINDINGS: The lungs remain hyperinflated. The pulmonary interstitial markings remain increased. Subpleural rounded 2-3 mm calcifications peripherally in the right upper lobe are stable. There is improving left lower lobe atelectasis or pneumonia. The heart is top-normal in size. The pulmonary vascularity is engorged.  The mediastinum is normal in width. There are small bilateral pleural effusions. The bony thorax exhibits no acute abnormality. IMPRESSION: Mild interval improvement in pulmonary interstitial edema. Persistent left lower lobe atelectasis or pneumonia with small bilateral pleural effusions. Previous granulomatous infection. Electronically Signed   By: David  Martinique M.D.   On: 02/24/2016 11:12        Scheduled Meds: . amLODipine  10 mg Oral Daily  . aspirin  81 mg Oral Daily  . carvedilol  37.5 mg Oral BID WC  . cinacalcet  90 mg Oral QAC breakfast  . cloNIDine  0.3 mg Oral BID  . doxercalciferol  3 mcg Intravenous Q M,W,F-HD  . heparin  5,000 Units Subcutaneous Q8H  . hydrALAZINE  25 mg Oral TID  . levETIRAcetam  1,000 mg Oral BID  . multivitamin  1 tablet Oral QHS  . pantoprazole  20 mg Oral Daily  . piperacillin-tazobactam (ZOSYN)  IV  3.375 g Intravenous Q12H  . sevelamer carbonate  2,400 mg Oral TID WC  . sodium chloride flush  3 mL Intravenous Q12H  . vancomycin  750 mg Intravenous Q M,W,F-HD   Continuous Infusions:    LOS: 1 day    Time spent: 35 minutes.     Elmarie Shiley, MD [facility] Pager [phone]  If 7PM-7AM, please contact night-coverage [email or web address] Password [email or web address] 02/25/2016, 4:00 PM

## 2016-02-25 NOTE — Progress Notes (Signed)
[facility] Progress Note   Subjective:  Seen in room. Dyspnea resolved, looks comfortable. No chest pain. Reports that he had prolonged bleeding from AVF after HD which he attributes to heparin. Long discussion about ongoing headaches with dialysis which is why he often cuts his HD short. Will try to investigate this further. No other new symptoms.  Objective Vitals:   02/24/16 1646 02/24/16 2145 02/25/16 0547 02/25/16 1027  BP: (!) 167/116 (!) 169/103 (!) 152/100 (!) 151/110  Pulse: 93 94 83 81  Resp: 20 18 18    Temp: 98 F (36.7 C) 98.7 F (37.1 C) 98.3 F (36.8 C)   TempSrc: Oral Oral Oral   SpO2: 100% 92% 91% 94%  Weight: 72.8 kg (160 lb 7.9 oz)     Height:       Physical Exam General: Well nourished male, NAD. Heart: RRR; 2/6 systolic murmur. Lungs: CTA anteriorly, faint crackles in R base. Abdomen: Soft, non-tender. Extremities: No LE edema. Dialysis Access: L forearm AVF + thrill/bruit  Additional Objective Labs: Basic Metabolic Panel:  Recent Labs Lab 02/24/16 0432 02/24/16 1500 02/25/16 0455  NA 135 135 137  K 5.8* 6.0* 5.3*  CL 95* 95* 96*  CO2 23 22 26   GLUCOSE 89 78 92  BUN 85* 95* 63*  CREATININE 12.32* 13.21* 10.14*  CALCIUM 9.1 8.9 9.7  PHOS 6.7* 7.0* 7.5*   Liver Function Tests:  Recent Labs Lab 02/23/16 1129 02/24/16 0432 02/24/16 1500 02/25/16 0455  AST 15  --   --   --   ALT 22  --   --   --   ALKPHOS 120  --   --   --   BILITOT 0.6  --   --   --   PROT 7.6  --   --   --   ALBUMIN 4.1 3.7 3.4* 3.5   CBC:  Recent Labs Lab 02/23/16 1129 02/23/16 1135 02/24/16 1500 02/25/16 0455  WBC 9.3  --  5.5 6.3  NEUTROABS 8.0*  --   --   --   HGB 9.2* 10.5* 8.4* 9.2*  HCT 29.6* 31.0* 26.1* 28.8*  MCV 99.0  --  96.0 97.0  PLT 227  --  189 196   Blood Culture    Component Value Date/Time   SDES BLOOD RIGHT ARM 02/24/2016 0950   SPECREQUEST BOTTLES DRAWN AEROBIC AND ANAEROBIC 5CC EACH 02/24/2016 0950   CULT NO  GROWTH 1 DAY 02/24/2016 0950   REPTSTATUS PENDING 02/24/2016 0950   Studies/Results: Dg Chest 2 View  Result Date: 02/24/2016 CLINICAL DATA:  Fever and weakness. History of COPD, end-stage renal disease, tobacco and cocaine abuse EXAM: CHEST  2 VIEW COMPARISON:  Portable chest x-ray of February 23, 2016 FINDINGS: The lungs remain hyperinflated. The pulmonary interstitial markings remain increased. Subpleural rounded 2-3 mm calcifications peripherally in the right upper lobe are stable. There is improving left lower lobe atelectasis or pneumonia. The heart is top-normal in size. The pulmonary vascularity is engorged. The mediastinum is normal in width. There are small bilateral pleural effusions. The bony thorax exhibits no acute abnormality. IMPRESSION: Mild interval improvement in pulmonary interstitial edema. Persistent left lower lobe atelectasis or pneumonia with small bilateral pleural effusions. Previous granulomatous infection. Electronically Signed   By: David  Martinique M.D.   On: 02/24/2016 11:12   Medications:   . amLODipine  10 mg Oral Daily  . aspirin  81 mg Oral Daily  . carvedilol  37.5 mg Oral BID WC  .  cinacalcet  90 mg Oral QAC breakfast  . cloNIDine  0.3 mg Oral BID  . doxercalciferol  3 mcg Intravenous Q M,W,F-HD  . heparin  5,000 Units Subcutaneous Q8H  . hydrALAZINE  25 mg Oral TID  . levETIRAcetam  1,000 mg Oral BID  . multivitamin  1 tablet Oral QHS  . pantoprazole  20 mg Oral Daily  . piperacillin-tazobactam (ZOSYN)  IV  3.375 g Intravenous Q12H  . sevelamer carbonate  1,600 mg Oral TID WC  . sodium chloride flush  3 mL Intravenous Q12H  . vancomycin  750 mg Intravenous Q M,W,F-HD    Dialysis Orders: Center: MWF at [facility] ([place]) [place] 4:15 hours, EDW 69.5kg, BFR 500/DFR A1.5, 2K/2Ca bath, AVF - No heparin - Mircera 153mcg IV q 2 weeks (last given 9/20) - Hectoral 58mcg IV q HD - Venofer 100mg  IV q HD (#2 out of 5  given)  Assessment/Plan: 1. Pulmonary edema: Presented around 8kg over outpatient EDW and required BiPap. Removed 3.5L with HD on 10/10, and anotehr 3L on 10/11. Looks in much better shape at this time. Will try to get down all the way to EDW with next HD. 2. Hyperkalemia: K 8.2 on admit requiring emergent HD 10/10, now normalized after 2 HD sessions. Continue to monitor. Low K diet discussed; had been eating tomatoes and french fries. 3. Pneumonia: Repeat CXR 10/11 showed LLL pneumonia. On Vanc/Zosyn. BCx pending. Dosing per pharmacy. 4. ESRD: As above. Emergent HD 10/10 for volume/high K, now back to MWF schedule. 5. Hypertension/volume: As above. BP coming down. Trying to get back to EDW. 6. Anemia: Hgb 9.2. Hold IV iron in light of infection. 7. Metabolic bone disease: Ca 9.7, Phos 7.5. Continue binders/VDRA/sensipar, but increase [place] 2->3/meals. 8. Seizure disorder: Per primary.   Veneta Penton, PA-C 02/25/2016, 1:41 PM  [employer] Pager: [phone]

## 2016-02-26 DIAGNOSIS — Z992 Dependence on renal dialysis: Secondary | ICD-10-CM

## 2016-02-26 DIAGNOSIS — N186 End stage renal disease: Secondary | ICD-10-CM

## 2016-02-26 LAB — RENAL FUNCTION PANEL
ALBUMIN: 3.7 g/dL (ref 3.5–5.0)
ANION GAP: 19 — AB (ref 5–15)
BUN: 90 mg/dL — AB (ref 6–20)
CALCIUM: 9.7 mg/dL (ref 8.9–10.3)
CO2: 24 mmol/L (ref 22–32)
Chloride: 95 mmol/L — ABNORMAL LOW (ref 101–111)
Creatinine, Ser: 12.61 mg/dL — ABNORMAL HIGH (ref 0.61–1.24)
GFR calc Af Amer: 5 mL/min — ABNORMAL LOW (ref >60)
GFR calc non Af Amer: 4 mL/min — ABNORMAL LOW (ref >60)
GLUCOSE: 139 mg/dL — AB (ref 65–99)
POTASSIUM: 5.3 mmol/L — AB (ref 3.5–5.1)
Phosphorus: 7.2 mg/dL — ABNORMAL HIGH (ref 2.5–4.6)
SODIUM: 138 mmol/L (ref 135–145)

## 2016-02-26 LAB — CBC
HEMATOCRIT: 27.5 % — AB (ref 39.0–52.0)
HEMOGLOBIN: 8.8 g/dL — AB (ref 13.0–17.0)
MCH: 30.4 pg (ref 26.0–34.0)
MCHC: 32 g/dL (ref 30.0–36.0)
MCV: 95.2 fL (ref 78.0–100.0)
Platelets: 188 10*3/uL (ref 150–400)
RBC: 2.89 MIL/uL — ABNORMAL LOW (ref 4.22–5.81)
RDW: 15.8 % — AB (ref 11.5–15.5)
WBC: 6.3 10*3/uL (ref 4.0–10.5)

## 2016-02-26 MED ORDER — DARBEPOETIN ALFA 60 MCG/0.3ML IJ SOSY: 60.0000 ug | PREFILLED_SYRINGE | Freq: Once | INTRAMUSCULAR | Status: AC

## 2016-02-26 MED ORDER — SODIUM CHLORIDE 0.9 % IV SOLN: 100.0000 mL | INTRAVENOUS | Status: DC | PRN

## 2016-02-26 MED ORDER — AMOXICILLIN-POT CLAVULANATE 875-125 MG PO TABS: 1.0000 | ORAL_TABLET | Freq: Two times a day (BID) | ORAL | 0 refills | Status: DC

## 2016-02-26 MED ORDER — DOXERCALCIFEROL 4 MCG/2ML IV SOLN
INTRAVENOUS | Status: AC
  Filled 2016-02-26: qty 2

## 2016-02-26 MED ORDER — DARBEPOETIN ALFA 60 MCG/0.3ML IJ SOSY
PREFILLED_SYRINGE | INTRAMUSCULAR | Status: AC
  Administered 2016-02-26: 60 ug
  Filled 2016-02-26: qty 0.3

## 2016-02-26 MED ORDER — VANCOMYCIN HCL IN DEXTROSE 750-5 MG/150ML-% IV SOLN
INTRAVENOUS | Status: AC
Start: 2016-02-26 — End: 2016-02-26
  Filled 2016-02-26: qty 150

## 2016-02-26 MED ORDER — HYDRALAZINE HCL 20 MG/ML IJ SOLN
10.0000 mg | Freq: Once | INTRAMUSCULAR | Status: AC
  Administered 2016-02-26: 10 mg via INTRAVENOUS
  Filled 2016-02-26: qty 1

## 2016-02-26 NOTE — Progress Notes (Signed)
[facility] Progress Note   Subjective:  "I can just go on and tell you I am signing off at 3 hours. I don't care what you say!" Patient actually pleasant, very talkative, is talking candidly about his substance abuse history and how he never plans to stop using cocaine. Says he'd be dead without his dialysis but just can't stand staying longer than 3 hours. Acknowledges that "My blood isn't getting clean when I sign off early though..."   On HD now, tolerating well. High gain since last Tx.   Objective Vitals:   02/25/16 1445 02/25/16 1601 02/25/16 2126 02/26/16 0546  BP: (!) 154/93 (!) 152/88 (!) 149/107 (!) 167/112  Pulse: 86 89 81 78  Resp: 16  18 18   Temp: 97.9 F (36.6 C)   97.8 F (36.6 C)  TempSrc: Oral  Oral   SpO2: 91% 95% 97% 97%  Weight:      Height:       Physical Exam General: Pleasant, NAD Heart: Y8,M5, RRR 2/6 systolic M Lungs: BBS CTA anteriorly Abdomen: abdomen soft, nontender Extremities: No LE edema Dialysis Access:  LFA AVF cannulated at present. Arterial/Venous pressures OK.   Additional Objective  Recent Labs Lab 02/24/16 0432 02/24/16 1500 02/25/16 0455  NA 135 135 137  K 5.8* 6.0* 5.3*  CL 95* 95* 96*  CO2 23 22 26   GLUCOSE 89 78 92  BUN 85* 95* 63*  CREATININE 12.32* 13.21* 10.14*  CALCIUM 9.1 8.9 9.7  PHOS 6.7* 7.0* 7.5*     Recent Labs Lab 02/23/16 1129 02/24/16 0432 02/24/16 1500 02/25/16 0455  AST 15  --   --   --   ALT 22  --   --   --   ALKPHOS 120  --   --   --   BILITOT 0.6  --   --   --   PROT 7.6  --   --   --   ALBUMIN 4.1 3.7 3.4* 3.5     Recent Labs Lab 02/23/16 1129 02/23/16 1135 02/24/16 1500 02/25/16 0455  WBC 9.3  --  5.5 6.3  NEUTROABS 8.0*  --   --   --   HGB 9.2* 10.5* 8.4* 9.2*  HCT 29.6* 31.0* 26.1* 28.8*  MCV 99.0  --  96.0 97.0  PLT 227  --  189 196   Blood Culture    Component Value Date/Time   SDES BLOOD RIGHT ARM 02/24/2016 0950   SPECREQUEST BOTTLES DRAWN AEROBIC AND  ANAEROBIC 5CC EACH 02/24/2016 0950   CULT NO GROWTH 1 DAY 02/24/2016 0950   REPTSTATUS PENDING 02/24/2016 0950    Studies/Results: Dg Chest 2 View  Result Date: 02/24/2016 CLINICAL DATA:  Fever and weakness. History of COPD, end-stage renal disease, tobacco and cocaine abuse EXAM: CHEST  2 VIEW COMPARISON:  Portable chest x-ray of February 23, 2016 FINDINGS: The lungs remain hyperinflated. The pulmonary interstitial markings remain increased. Subpleural rounded 2-3 mm calcifications peripherally in the right upper lobe are stable. There is improving left lower lobe atelectasis or pneumonia. The heart is top-normal in size. The pulmonary vascularity is engorged. The mediastinum is normal in width. There are small bilateral pleural effusions. The bony thorax exhibits no acute abnormality. IMPRESSION: Mild interval improvement in pulmonary interstitial edema. Persistent left lower lobe atelectasis or pneumonia with small bilateral pleural effusions. Previous granulomatous infection. Electronically Signed   By: David  Martinique M.D.   On: 02/24/2016 11:12   Medications:   . amLODipine  10 mg Oral Daily  . aspirin  81 mg Oral Daily  . carvedilol  37.5 mg Oral BID WC  . cinacalcet  90 mg Oral QAC breakfast  . cloNIDine  0.3 mg Oral BID  . doxercalciferol  3 mcg Intravenous Q M,W,F-HD  . heparin  5,000 Units Subcutaneous Q8H  . hydrALAZINE  25 mg Oral TID  . levETIRAcetam  1,000 mg Oral BID  . multivitamin  1 tablet Oral QHS  . pantoprazole  20 mg Oral Daily  . piperacillin-tazobactam (ZOSYN)  IV  3.375 g Intravenous Q12H  . sevelamer carbonate  2,400 mg Oral TID WC  . sodium chloride flush  3 mL Intravenous Q12H  . vancomycin  750 mg Intravenous Q M,W,F-HD   Dialysis Orders: Center: MWF at [facility] ([place]) [place] 4:15 hours, EDW 69.5kg, BFR 500/DFR A1.5, 2K/2Ca bath, AVF - No heparin - Mircera 124mcg IV q 2 weeks (last given 9/20) - Hectoral 33mcg IV q HD - Venofer 100mg  IV q HD (#2  out of 5 given)    Assessment/Plan: 1.  Pulmonary edema: Presented around8kg over outpatient EDW and required BiPap. Removed 3.5L with HD on 10/10, and another 3L on 10/11. Looks in much better shape at this time. Presented to HD this AM 7.6 kg above EDW. Will attempt UFG 5500. Patient has already said he will sign off early. Will put on UF profile 2-see if we can facilitate volume removal early in tx while he remains on machine.   2. Hyperkalemia: K 8.2 on admit requiring emergent HD 10/10, now normalized after 2 HD sessions. Continue to monitor. Low K diet discussed; had been eating tomatoes and french fries. K+ 5.3 02/25/16. Lab pending for this AM.   3. Pneumonia: Repeat CXR 10/11 showed LLL pneumonia. On Vanc/Zosyn. BCx NG X 1 day. Dosing per pharmacy.  4.  ESRD: As above. Emergent HD 10/10 for volume/high K, now back to MWF schedule.  5.  Hypertension/volume: As above. BP coming down. Trying to get back to EDW.  6. Anemia: Hgb 9.2 . Hold IV iron in light of infection. Last OP ESA dose 02/03/16. Give Aranesp 60 mcg IV today. Follow HGB.   7. Metabolic bone disease: Ca 9.7, Phos 7.5. Continue binders/VDRA/sensipar, but increase Renvela 2->3/meals.  8. Seizure disorder: Per primary.  Rita H. Brown NP-C 02/26/2016, 8:33 AM  [employer] [phone]  I have seen and examined this patient and agree with plan and assessment in the above note with renal recommendations/intervention highlighted. Remains noncompliant with fluid restriction in the hospital and will not stay on his full treatment times. HD today for as long as he will stay on the machine.  Roben Schliep B,MD 02/26/2016 10:07 AM

## 2016-02-26 NOTE — Progress Notes (Signed)
Discharge instructions given to patient and family, both verbalized understanding, prescription given to patient, CCMD notified, and IV removed.  Betha Loa Boden Stucky, RN

## 2016-02-26 NOTE — Progress Notes (Addendum)
Patient back from dialysis upset and angry about being here. Patient stated his ride is coming and he is leaving. Attempted to reassure patient and explain that Dr. Tyrell Antonio is coming back up to see him and discharge him. Patient still upset threatening to leave AMA. Dr. Tyrell Antonio contacted and orders received. MD stated she is coming to see patient. Patient updated.  Patient refusing all medication and stated he will take at home.

## 2016-02-26 NOTE — Progress Notes (Signed)
Dr. Tyrell Antonio asked for patient to have BP rechecked and patient refused to wait until BP decreased. Education provided and patient stated he wanted to leave.

## 2016-02-26 NOTE — Discharge Summary (Signed)
Physician Discharge Summary  Jeremy Mccoy CWC:[id number] DOB: 01/01/1966 DOA: 02/23/2016  PCP: Leamon Arnt, MD  Admit date: 02/23/2016 Discharge date: 02/26/2016  Admitted From: home  Disposition:  Home   Recommendations for Outpatient Follow-up:  1. Follow up with PCP in 1-2 weeks 2. Please obtain BMP/CBC in one week  Discharge Condition: stable.  CODE STATUS: full code.  Diet recommendation: Heart Healthy  Brief/Interim Summary: Jeremy Mapel McMahanis a 50 y.o.malewith medical history significant of ESRD on dialysis, hypertension, seizures, substance abuse. Patient states that he missed dialysis yesterday because he came in late from out of town. Overnight he developed significant dyspnea and orthopnea and decided to come to the emergency department for evaluation and dialysis.  ED Course: Vitals: Hypertensive in urgency range. BiPAP initially Labs: Potassium up to 8.2. BUN/creatinine above detectable range. Potassium of 130. Hemoglobin of 10.5 Imaging: CXR with mild pleural effusion Medications/Course: Albuterol, Calcium gluconate, D50, Dialysis    Assessment & Plan:  Acute respiratory failure Secondary to fluid overload from missing dialysis.underwent Emergent HD 10-10 a,d component of diastolic HF exacerbation  -had dialysis 10-11 Improving.   PNA, treating for Health care associated.  Fever; 101 on 10-10/ Blood culture. No growth to date  Chest x ray with finding consistent with PNA Continue with Vancomycin and Zosyn day 3 Discharge on Augmentin for 4 more days. .   Hyperkalemia Needing emergent dialysis. Secondary to missing dialysis Improving. Correction with HD>  Avoid Cozaar.  improving.   End stage renal disease on hemodialysis MWF -dialysis per nephrology dialysis tomorrow.   Hypertension Hypertensive urgency -continue amlodipine, clonidine, hydralazine,  carvedilol -Discontinue Cozaar due to Hyperkalemia.   Protein calorie  malnutrition, severe -Continue supplementation  History of Paroxysmal atrial fibrillation Appears in sinus on exam. Currently not on anticoagulation. CHA2DS2-VASc Scoreis 3. Listed as a problem but cannot find documentation. Closest thing is a history of SVT in 2015 which did not look to be atrial fibrillation. Rate is controlled -continue coreg  Seizures -continue Keppra  Acute on Chronic diastolic heart failure exacerbation.  Pulmonary edema, due to missing HD. Combination.  -dialysis   Discharge Diagnoses:  Principal Problem:   Acute respiratory failure ([place]) Active Problems:   Tobacco abuse   Cocaine abuse   Anemia in chronic kidney disease   Essential hypertension   Protein-calorie malnutrition, severe (HCC)   ESRD on hemodialysis (HCC)   PAF (paroxysmal atrial fibrillation) (HCC)   Chronic diastolic congestive heart failure (HCC)   Hyperkalemia    Discharge Instructions  Discharge Instructions    Diet - low sodium heart healthy    Complete by:  As directed    Increase activity slowly    Complete by:  As directed        Medication List    STOP taking these medications   losartan 100 MG tablet Commonly known as:  COZAAR     TAKE these medications   acetaminophen 500 MG tablet Commonly known as:  TYLENOL Take 1,000 mg by mouth daily at 6 (six) AM. 6 times a day, 500 mg, 12 to 24 tabs a day, 6,000 to 12,000 mg daily for pain   amLODipine 10 MG tablet Commonly known as:  NORVASC Take 1 tablet (10 mg total) by mouth daily.   amoxicillin-clavulanate 875-125 MG tablet Commonly known as:  AUGMENTIN Take 1 tablet by mouth 2 (two) times daily.   aspirin 81 MG chewable tablet Chew 1 tablet (81 mg total) by mouth daily.   calcium carbonate 500  MG chewable tablet Commonly known as:  TUMS - dosed in mg elemental calcium Chew 500 mg by mouth daily as needed for indigestion or heartburn.   carvedilol 25 MG tablet Commonly known as:  COREG Take 1.5  tablets (37.5 mg total) by mouth 2 (two) times daily with a meal. On Monday-Wednesday-Friday-Sunday (non-HD days); on dialysis days (Tuesday-Thursday-Saturday) take it once a day (evening time only).   cloNIDine 0.3 MG tablet Commonly known as:  CATAPRES Take 0.3 mg by mouth 2 (two) times daily.   diphenhydrAMINE 25 MG tablet Commonly known as:  BENADRYL Take 25 mg by mouth every 6 (six) hours as needed for itching.   feeding supplement (NEPRO CARB STEADY) Liqd Take 237 mLs by mouth 3 (three) times daily as needed (Supplement).   hydrALAZINE 25 MG tablet Commonly known as:  APRESOLINE Take 25 mg by mouth 3 (three) times daily.   hydrOXYzine 25 MG tablet Commonly known as:  ATARAX/VISTARIL Take 25 mg by mouth 3 (three) times daily as needed for itching.   levETIRAcetam 1000 MG tablet Commonly known as:  KEPPRA TAKE 1 TABLET BY MOUTH 2 TIMES DAILY.   multivitamin Tabs tablet Take 1 tablet by mouth at bedtime.   pantoprazole 20 MG tablet Commonly known as:  PROTONIX Take 20 mg by mouth daily. What changed:  Another medication with the same name was removed. Continue taking this medication, and follow the directions you see here.   SENSIPAR 30 MG tablet Generic drug:  cinacalcet Take 60 mg by mouth daily.   SENSIPAR 90 MG tablet Generic drug:  cinacalcet Take 90 mg by mouth daily.   sevelamer carbonate 800 MG tablet Commonly known as:  RENVELA Take 2 tablets (1,600 mg total) by mouth 3 (three) times daily with meals.       No Known Allergies  Consultations:  Nephrology    Procedures/Studies: Dg Chest 2 View  Result Date: 02/24/2016 CLINICAL DATA:  Fever and weakness. History of COPD, end-stage renal disease, tobacco and cocaine abuse EXAM: CHEST  2 VIEW COMPARISON:  Portable chest x-ray of February 23, 2016 FINDINGS: The lungs remain hyperinflated. The pulmonary interstitial markings remain increased. Subpleural rounded 2-3 mm calcifications peripherally in the  right upper lobe are stable. There is improving left lower lobe atelectasis or pneumonia. The heart is top-normal in size. The pulmonary vascularity is engorged. The mediastinum is normal in width. There are small bilateral pleural effusions. The bony thorax exhibits no acute abnormality. IMPRESSION: Mild interval improvement in pulmonary interstitial edema. Persistent left lower lobe atelectasis or pneumonia with small bilateral pleural effusions. Previous granulomatous infection. Electronically Signed   By: David  Martinique M.D.   On: 02/24/2016 11:12   Dg Chest Portable 1 View  Result Date: 02/23/2016 CLINICAL DATA:  Shortness of breath.  Missed dialysis today. EXAM: PORTABLE CHEST 1 VIEW COMPARISON:  07/25/2015 FINDINGS: Interstitial and airspace densities in both lungs, particularly in the lower lungs. Findings are most compatible with pulmonary edema. Central vascular structures are also enlarged. Heart size is mildly enlarged. The trachea is midline. Cannot exclude small pleural effusions. Multiple small calcified granulomas in the right upper lung. IMPRESSION: Pulmonary edema. Mild cardiomegaly. Cannot exclude small pleural effusions. Electronically Signed   By: Markus Daft M.D.   On: 02/23/2016 11:57      Subjective: Was very upset, food was cold, they want I eat that cold food, that was breakfast.  Does not like to have 4 hour treatment for HD   Discharge Exam:  Vitals:   02/26/16 1306 02/26/16 1339  BP: (!) 177/124 (!) 171/117  Pulse: 91 89  Resp:  16  Temp:     Vitals:   02/26/16 1100 02/26/16 1232 02/26/16 1306 02/26/16 1339  BP: (!) 180/117 (!) 184/121 (!) 177/124 (!) 171/117  Pulse: 92 90 91 89  Resp: 16 16  16   Temp: 97 F (36.1 C) 98.2 F (36.8 C)    TempSrc: Oral Oral    SpO2: 95% 98% 98%   Weight: 72 kg (158 lb 11.7 oz)     Height:        General: Pt is alert, awake, not in acute distress Cardiovascular: RRR, S1/S2 +, no rubs, no gallops Respiratory: CTA  bilaterally, no wheezing, no rhonchi Abdominal: Soft, NT, ND, bowel sounds + Extremities: no edema, no cyanosis    The results of significant diagnostics from this hospitalization (including imaging, microbiology, ancillary and laboratory) are listed below for reference.     Microbiology: Recent Results (from the past 240 hour(s))  Culture, blood (routine x 2)     Status: None (Preliminary result)   Collection Time: 02/24/16  9:35 AM  Result Value Ref Range Status   Specimen Description BLOOD RIGHT HAND  Final   Special Requests BOTTLES DRAWN AEROBIC AND ANAEROBIC 5CC EACH  Final   Culture NO GROWTH 2 DAYS  Final   Report Status PENDING  Incomplete  Culture, blood (routine x 2)     Status: None (Preliminary result)   Collection Time: 02/24/16  9:50 AM  Result Value Ref Range Status   Specimen Description BLOOD RIGHT ARM  Final   Special Requests BOTTLES DRAWN AEROBIC AND ANAEROBIC 5CC EACH  Final   Culture NO GROWTH 2 DAYS  Final   Report Status PENDING  Incomplete  MRSA PCR Screening     Status: None   Collection Time: 02/24/16 12:10 PM  Result Value Ref Range Status   MRSA by PCR NEGATIVE NEGATIVE Final    Comment:        The GeneXpert MRSA Assay (FDA approved for NASAL specimens only), is one component of a comprehensive MRSA colonization surveillance program. It is not intended to diagnose MRSA infection nor to guide or monitor treatment for MRSA infections.      Labs: BNP (last 3 results) No results for input(s): BNP in the last 8760 hours. Basic Metabolic Panel:  Recent Labs Lab 02/23/16 1129 02/23/16 1135 02/24/16 0432 02/24/16 1500 02/25/16 0455 02/26/16 0843  NA 133* 130* 135 135 137 138  K >7.5* 8.2* 5.8* 6.0* 5.3* 5.3*  CL 93* 100* 95* 95* 96* 95*  CO2 17*  --  23 22 26 24   GLUCOSE 96 92 89 78 92 139*  BUN 147* >140* 85* 95* 63* 90*  CREATININE 19.36* >18.00* 12.32* 13.21* 10.14* 12.61*  CALCIUM 9.4  --  9.1 8.9 9.7 9.7  PHOS  --   --  6.7*  7.0* 7.5* 7.2*   Liver Function Tests:  Recent Labs Lab 02/23/16 1129 02/24/16 0432 02/24/16 1500 02/25/16 0455 02/26/16 0843  AST 15  --   --   --   --   ALT 22  --   --   --   --   ALKPHOS 120  --   --   --   --   BILITOT 0.6  --   --   --   --   PROT 7.6  --   --   --   --  ALBUMIN 4.1 3.7 3.4* 3.5 3.7   No results for input(s): LIPASE, AMYLASE in the last 168 hours. No results for input(s): AMMONIA in the last 168 hours. CBC:  Recent Labs Lab 02/23/16 1129 02/23/16 1135 02/24/16 1500 02/25/16 0455 02/26/16 0843  WBC 9.3  --  5.5 6.3 6.3  NEUTROABS 8.0*  --   --   --   --   HGB 9.2* 10.5* 8.4* 9.2* 8.8*  HCT 29.6* 31.0* 26.1* 28.8* 27.5*  MCV 99.0  --  96.0 97.0 95.2  PLT 227  --  189 196 188   Cardiac Enzymes: No results for input(s): CKTOTAL, CKMB, CKMBINDEX, TROPONINI in the last 168 hours. BNP: Invalid input(s): POCBNP CBG: No results for input(s): GLUCAP in the last 168 hours. D-Dimer No results for input(s): DDIMER in the last 72 hours. Hgb A1c No results for input(s): HGBA1C in the last 72 hours. Lipid Profile No results for input(s): CHOL, HDL, LDLCALC, TRIG, CHOLHDL, LDLDIRECT in the last 72 hours. Thyroid function studies No results for input(s): TSH, T4TOTAL, T3FREE, THYROIDAB in the last 72 hours.  Invalid input(s): FREET3 Anemia work up No results for input(s): VITAMINB12, FOLATE, FERRITIN, TIBC, IRON, RETICCTPCT in the last 72 hours. Urinalysis    Component Value Date/Time   COLORURINE YELLOW 11/04/2011 1410   APPEARANCEUR CLOUDY (A) 11/04/2011 1410   LABSPEC 1.014 11/04/2011 1410   PHURINE 5.0 11/04/2011 1410   GLUCOSEU NEGATIVE 11/04/2011 1410   HGBUR LARGE (A) 11/04/2011 1410   BILIRUBINUR NEGATIVE 11/04/2011 1410   KETONESUR NEGATIVE 11/04/2011 1410   PROTEINUR 100 (A) 11/04/2011 1410   UROBILINOGEN 0.2 11/04/2011 1410   NITRITE NEGATIVE 11/04/2011 1410   LEUKOCYTESUR TRACE (A) 11/04/2011 1410   Sepsis Labs Invalid input(s):  PROCALCITONIN,  WBC,  LACTICIDVEN Microbiology Recent Results (from the past 240 hour(s))  Culture, blood (routine x 2)     Status: None (Preliminary result)   Collection Time: 02/24/16  9:35 AM  Result Value Ref Range Status   Specimen Description BLOOD RIGHT HAND  Final   Special Requests BOTTLES DRAWN AEROBIC AND ANAEROBIC 5CC EACH  Final   Culture NO GROWTH 2 DAYS  Final   Report Status PENDING  Incomplete  Culture, blood (routine x 2)     Status: None (Preliminary result)   Collection Time: 02/24/16  9:50 AM  Result Value Ref Range Status   Specimen Description BLOOD RIGHT ARM  Final   Special Requests BOTTLES DRAWN AEROBIC AND ANAEROBIC 5CC EACH  Final   Culture NO GROWTH 2 DAYS  Final   Report Status PENDING  Incomplete  MRSA PCR Screening     Status: None   Collection Time: 02/24/16 12:10 PM  Result Value Ref Range Status   MRSA by PCR NEGATIVE NEGATIVE Final    Comment:        The GeneXpert MRSA Assay (FDA approved for NASAL specimens only), is one component of a comprehensive MRSA colonization surveillance program. It is not intended to diagnose MRSA infection nor to guide or monitor treatment for MRSA infections.      Time coordinating discharge: Over 30 minutes  SIGNED:   Elmarie Shiley, MD  [facility] 02/26/2016, 8:00 PM Pager   If 7PM-7AM, please contact night-coverage [email or web address] Password [email or web address]

## 2016-02-26 NOTE — Procedures (Signed)
I have personally attended this patient's dialysis session.   7.5 kg >EDW and will not stay on full time Will do the best we can with fluid removal  Jamal Maes, MD [place] Pager 02/26/2016, 10:09 AM

## 2016-02-29 DIAGNOSIS — D631 Anemia in chronic kidney disease: Secondary | ICD-10-CM | POA: Diagnosis not present

## 2016-02-29 DIAGNOSIS — D649 Anemia, unspecified: Secondary | ICD-10-CM | POA: Diagnosis not present

## 2016-02-29 DIAGNOSIS — N186 End stage renal disease: Secondary | ICD-10-CM | POA: Diagnosis not present

## 2016-02-29 DIAGNOSIS — D509 Iron deficiency anemia, unspecified: Secondary | ICD-10-CM | POA: Diagnosis not present

## 2016-02-29 DIAGNOSIS — N2581 Secondary hyperparathyroidism of renal origin: Secondary | ICD-10-CM | POA: Diagnosis not present

## 2016-02-29 LAB — CULTURE, BLOOD (ROUTINE X 2)
CULTURE: NO GROWTH
CULTURE: NO GROWTH

## 2016-03-02 DIAGNOSIS — D631 Anemia in chronic kidney disease: Secondary | ICD-10-CM | POA: Diagnosis not present

## 2016-03-02 DIAGNOSIS — D649 Anemia, unspecified: Secondary | ICD-10-CM | POA: Diagnosis not present

## 2016-03-02 DIAGNOSIS — N186 End stage renal disease: Secondary | ICD-10-CM | POA: Diagnosis not present

## 2016-03-02 DIAGNOSIS — N2581 Secondary hyperparathyroidism of renal origin: Secondary | ICD-10-CM | POA: Diagnosis not present

## 2016-03-02 DIAGNOSIS — D509 Iron deficiency anemia, unspecified: Secondary | ICD-10-CM | POA: Diagnosis not present

## 2016-03-04 DIAGNOSIS — N186 End stage renal disease: Secondary | ICD-10-CM | POA: Diagnosis not present

## 2016-03-04 DIAGNOSIS — D509 Iron deficiency anemia, unspecified: Secondary | ICD-10-CM | POA: Diagnosis not present

## 2016-03-04 DIAGNOSIS — D631 Anemia in chronic kidney disease: Secondary | ICD-10-CM | POA: Diagnosis not present

## 2016-03-04 DIAGNOSIS — N2581 Secondary hyperparathyroidism of renal origin: Secondary | ICD-10-CM | POA: Diagnosis not present

## 2016-03-04 DIAGNOSIS — D649 Anemia, unspecified: Secondary | ICD-10-CM | POA: Diagnosis not present

## 2016-03-07 ENCOUNTER — Other Ambulatory Visit: Payer: Self-pay | Admitting: Neurology

## 2016-03-07 DIAGNOSIS — D509 Iron deficiency anemia, unspecified: Secondary | ICD-10-CM | POA: Diagnosis not present

## 2016-03-07 DIAGNOSIS — N186 End stage renal disease: Secondary | ICD-10-CM | POA: Diagnosis not present

## 2016-03-07 DIAGNOSIS — D649 Anemia, unspecified: Secondary | ICD-10-CM | POA: Diagnosis not present

## 2016-03-07 DIAGNOSIS — D631 Anemia in chronic kidney disease: Secondary | ICD-10-CM | POA: Diagnosis not present

## 2016-03-07 DIAGNOSIS — N2581 Secondary hyperparathyroidism of renal origin: Secondary | ICD-10-CM | POA: Diagnosis not present

## 2016-03-07 NOTE — Telephone Encounter (Signed)
Take one tablet twice daily.  You must be seen prior to future refills.  Please call for an appt. [phone]

## 2016-03-09 DIAGNOSIS — N2581 Secondary hyperparathyroidism of renal origin: Secondary | ICD-10-CM | POA: Diagnosis not present

## 2016-03-09 DIAGNOSIS — D631 Anemia in chronic kidney disease: Secondary | ICD-10-CM | POA: Diagnosis not present

## 2016-03-09 DIAGNOSIS — D509 Iron deficiency anemia, unspecified: Secondary | ICD-10-CM | POA: Diagnosis not present

## 2016-03-09 DIAGNOSIS — N186 End stage renal disease: Secondary | ICD-10-CM | POA: Diagnosis not present

## 2016-03-09 DIAGNOSIS — D649 Anemia, unspecified: Secondary | ICD-10-CM | POA: Diagnosis not present

## 2016-03-11 DIAGNOSIS — N186 End stage renal disease: Secondary | ICD-10-CM | POA: Diagnosis not present

## 2016-03-11 DIAGNOSIS — D509 Iron deficiency anemia, unspecified: Secondary | ICD-10-CM | POA: Diagnosis not present

## 2016-03-11 DIAGNOSIS — D631 Anemia in chronic kidney disease: Secondary | ICD-10-CM | POA: Diagnosis not present

## 2016-03-11 DIAGNOSIS — N2581 Secondary hyperparathyroidism of renal origin: Secondary | ICD-10-CM | POA: Diagnosis not present

## 2016-03-11 DIAGNOSIS — D649 Anemia, unspecified: Secondary | ICD-10-CM | POA: Diagnosis not present

## 2016-03-14 DIAGNOSIS — N2581 Secondary hyperparathyroidism of renal origin: Secondary | ICD-10-CM | POA: Diagnosis not present

## 2016-03-14 DIAGNOSIS — D649 Anemia, unspecified: Secondary | ICD-10-CM | POA: Diagnosis not present

## 2016-03-14 DIAGNOSIS — D631 Anemia in chronic kidney disease: Secondary | ICD-10-CM | POA: Diagnosis not present

## 2016-03-14 DIAGNOSIS — D509 Iron deficiency anemia, unspecified: Secondary | ICD-10-CM | POA: Diagnosis not present

## 2016-03-14 DIAGNOSIS — N186 End stage renal disease: Secondary | ICD-10-CM | POA: Diagnosis not present

## 2016-03-15 DIAGNOSIS — I10 Essential (primary) hypertension: Secondary | ICD-10-CM | POA: Diagnosis not present

## 2016-03-15 DIAGNOSIS — Z992 Dependence on renal dialysis: Secondary | ICD-10-CM | POA: Diagnosis not present

## 2016-03-15 DIAGNOSIS — N186 End stage renal disease: Secondary | ICD-10-CM | POA: Diagnosis not present

## 2016-03-16 DIAGNOSIS — D631 Anemia in chronic kidney disease: Secondary | ICD-10-CM | POA: Diagnosis not present

## 2016-03-16 DIAGNOSIS — D509 Iron deficiency anemia, unspecified: Secondary | ICD-10-CM | POA: Diagnosis not present

## 2016-03-16 DIAGNOSIS — N186 End stage renal disease: Secondary | ICD-10-CM | POA: Diagnosis not present

## 2016-03-18 DIAGNOSIS — N186 End stage renal disease: Secondary | ICD-10-CM | POA: Diagnosis not present

## 2016-03-18 DIAGNOSIS — D631 Anemia in chronic kidney disease: Secondary | ICD-10-CM | POA: Diagnosis not present

## 2016-03-18 DIAGNOSIS — D509 Iron deficiency anemia, unspecified: Secondary | ICD-10-CM | POA: Diagnosis not present

## 2016-03-21 DIAGNOSIS — D509 Iron deficiency anemia, unspecified: Secondary | ICD-10-CM | POA: Diagnosis not present

## 2016-03-21 DIAGNOSIS — D631 Anemia in chronic kidney disease: Secondary | ICD-10-CM | POA: Diagnosis not present

## 2016-03-21 DIAGNOSIS — N186 End stage renal disease: Secondary | ICD-10-CM | POA: Diagnosis not present

## 2016-03-23 DIAGNOSIS — D509 Iron deficiency anemia, unspecified: Secondary | ICD-10-CM | POA: Diagnosis not present

## 2016-03-23 DIAGNOSIS — D631 Anemia in chronic kidney disease: Secondary | ICD-10-CM | POA: Diagnosis not present

## 2016-03-23 DIAGNOSIS — N186 End stage renal disease: Secondary | ICD-10-CM | POA: Diagnosis not present

## 2016-03-24 DIAGNOSIS — D631 Anemia in chronic kidney disease: Secondary | ICD-10-CM | POA: Diagnosis not present

## 2016-03-24 DIAGNOSIS — D509 Iron deficiency anemia, unspecified: Secondary | ICD-10-CM | POA: Diagnosis not present

## 2016-03-24 DIAGNOSIS — N186 End stage renal disease: Secondary | ICD-10-CM | POA: Diagnosis not present

## 2016-04-01 DIAGNOSIS — N186 End stage renal disease: Secondary | ICD-10-CM | POA: Diagnosis not present

## 2016-04-01 DIAGNOSIS — D509 Iron deficiency anemia, unspecified: Secondary | ICD-10-CM | POA: Diagnosis not present

## 2016-04-01 DIAGNOSIS — D631 Anemia in chronic kidney disease: Secondary | ICD-10-CM | POA: Diagnosis not present

## 2016-04-04 DIAGNOSIS — D631 Anemia in chronic kidney disease: Secondary | ICD-10-CM | POA: Diagnosis not present

## 2016-04-04 DIAGNOSIS — D509 Iron deficiency anemia, unspecified: Secondary | ICD-10-CM | POA: Diagnosis not present

## 2016-04-04 DIAGNOSIS — N186 End stage renal disease: Secondary | ICD-10-CM | POA: Diagnosis not present

## 2016-04-08 DIAGNOSIS — N186 End stage renal disease: Secondary | ICD-10-CM | POA: Diagnosis not present

## 2016-04-08 DIAGNOSIS — D631 Anemia in chronic kidney disease: Secondary | ICD-10-CM | POA: Diagnosis not present

## 2016-04-08 DIAGNOSIS — D509 Iron deficiency anemia, unspecified: Secondary | ICD-10-CM | POA: Diagnosis not present

## 2016-04-11 DIAGNOSIS — D509 Iron deficiency anemia, unspecified: Secondary | ICD-10-CM | POA: Diagnosis not present

## 2016-04-11 DIAGNOSIS — N186 End stage renal disease: Secondary | ICD-10-CM | POA: Diagnosis not present

## 2016-04-11 DIAGNOSIS — D631 Anemia in chronic kidney disease: Secondary | ICD-10-CM | POA: Diagnosis not present

## 2016-04-13 DIAGNOSIS — D509 Iron deficiency anemia, unspecified: Secondary | ICD-10-CM | POA: Diagnosis not present

## 2016-04-13 DIAGNOSIS — N186 End stage renal disease: Secondary | ICD-10-CM | POA: Diagnosis not present

## 2016-04-13 DIAGNOSIS — D631 Anemia in chronic kidney disease: Secondary | ICD-10-CM | POA: Diagnosis not present

## 2016-04-14 DIAGNOSIS — Z992 Dependence on renal dialysis: Secondary | ICD-10-CM | POA: Diagnosis not present

## 2016-04-14 DIAGNOSIS — I10 Essential (primary) hypertension: Secondary | ICD-10-CM | POA: Diagnosis not present

## 2016-04-14 DIAGNOSIS — N186 End stage renal disease: Secondary | ICD-10-CM | POA: Diagnosis not present

## 2016-04-15 DIAGNOSIS — D631 Anemia in chronic kidney disease: Secondary | ICD-10-CM | POA: Diagnosis not present

## 2016-04-15 DIAGNOSIS — N2581 Secondary hyperparathyroidism of renal origin: Secondary | ICD-10-CM | POA: Diagnosis not present

## 2016-04-15 DIAGNOSIS — N186 End stage renal disease: Secondary | ICD-10-CM | POA: Diagnosis not present

## 2016-04-18 DIAGNOSIS — N186 End stage renal disease: Secondary | ICD-10-CM | POA: Diagnosis not present

## 2016-04-18 DIAGNOSIS — N2581 Secondary hyperparathyroidism of renal origin: Secondary | ICD-10-CM | POA: Diagnosis not present

## 2016-04-18 DIAGNOSIS — D631 Anemia in chronic kidney disease: Secondary | ICD-10-CM | POA: Diagnosis not present

## 2016-04-20 DIAGNOSIS — N186 End stage renal disease: Secondary | ICD-10-CM | POA: Diagnosis not present

## 2016-04-20 DIAGNOSIS — D631 Anemia in chronic kidney disease: Secondary | ICD-10-CM | POA: Diagnosis not present

## 2016-04-20 DIAGNOSIS — N2581 Secondary hyperparathyroidism of renal origin: Secondary | ICD-10-CM | POA: Diagnosis not present

## 2016-04-23 DIAGNOSIS — N2581 Secondary hyperparathyroidism of renal origin: Secondary | ICD-10-CM | POA: Diagnosis not present

## 2016-04-23 DIAGNOSIS — N186 End stage renal disease: Secondary | ICD-10-CM | POA: Diagnosis not present

## 2016-04-23 DIAGNOSIS — D631 Anemia in chronic kidney disease: Secondary | ICD-10-CM | POA: Diagnosis not present

## 2016-04-25 DIAGNOSIS — D631 Anemia in chronic kidney disease: Secondary | ICD-10-CM | POA: Diagnosis not present

## 2016-04-25 DIAGNOSIS — N2581 Secondary hyperparathyroidism of renal origin: Secondary | ICD-10-CM | POA: Diagnosis not present

## 2016-04-25 DIAGNOSIS — N186 End stage renal disease: Secondary | ICD-10-CM | POA: Diagnosis not present

## 2016-04-27 DIAGNOSIS — N186 End stage renal disease: Secondary | ICD-10-CM | POA: Diagnosis not present

## 2016-04-27 DIAGNOSIS — N2581 Secondary hyperparathyroidism of renal origin: Secondary | ICD-10-CM | POA: Diagnosis not present

## 2016-04-27 DIAGNOSIS — D631 Anemia in chronic kidney disease: Secondary | ICD-10-CM | POA: Diagnosis not present

## 2016-04-29 DIAGNOSIS — N2581 Secondary hyperparathyroidism of renal origin: Secondary | ICD-10-CM | POA: Diagnosis not present

## 2016-04-29 DIAGNOSIS — D631 Anemia in chronic kidney disease: Secondary | ICD-10-CM | POA: Diagnosis not present

## 2016-04-29 DIAGNOSIS — N186 End stage renal disease: Secondary | ICD-10-CM | POA: Diagnosis not present

## 2016-05-02 DIAGNOSIS — N2581 Secondary hyperparathyroidism of renal origin: Secondary | ICD-10-CM | POA: Diagnosis not present

## 2016-05-02 DIAGNOSIS — N186 End stage renal disease: Secondary | ICD-10-CM | POA: Diagnosis not present

## 2016-05-02 DIAGNOSIS — D631 Anemia in chronic kidney disease: Secondary | ICD-10-CM | POA: Diagnosis not present

## 2016-05-04 DIAGNOSIS — N2581 Secondary hyperparathyroidism of renal origin: Secondary | ICD-10-CM | POA: Diagnosis not present

## 2016-05-04 DIAGNOSIS — D631 Anemia in chronic kidney disease: Secondary | ICD-10-CM | POA: Diagnosis not present

## 2016-05-04 DIAGNOSIS — N186 End stage renal disease: Secondary | ICD-10-CM | POA: Diagnosis not present

## 2016-05-06 DIAGNOSIS — N186 End stage renal disease: Secondary | ICD-10-CM | POA: Diagnosis not present

## 2016-05-06 DIAGNOSIS — D631 Anemia in chronic kidney disease: Secondary | ICD-10-CM | POA: Diagnosis not present

## 2016-05-06 DIAGNOSIS — N2581 Secondary hyperparathyroidism of renal origin: Secondary | ICD-10-CM | POA: Diagnosis not present

## 2016-05-11 DIAGNOSIS — N2581 Secondary hyperparathyroidism of renal origin: Secondary | ICD-10-CM | POA: Diagnosis not present

## 2016-05-11 DIAGNOSIS — D631 Anemia in chronic kidney disease: Secondary | ICD-10-CM | POA: Diagnosis not present

## 2016-05-11 DIAGNOSIS — N186 End stage renal disease: Secondary | ICD-10-CM | POA: Diagnosis not present

## 2016-05-13 DIAGNOSIS — N186 End stage renal disease: Secondary | ICD-10-CM | POA: Diagnosis not present

## 2016-05-13 DIAGNOSIS — N2581 Secondary hyperparathyroidism of renal origin: Secondary | ICD-10-CM | POA: Diagnosis not present

## 2016-05-13 DIAGNOSIS — D631 Anemia in chronic kidney disease: Secondary | ICD-10-CM | POA: Diagnosis not present

## 2016-05-15 DIAGNOSIS — N2581 Secondary hyperparathyroidism of renal origin: Secondary | ICD-10-CM | POA: Diagnosis not present

## 2016-05-15 DIAGNOSIS — I10 Essential (primary) hypertension: Secondary | ICD-10-CM | POA: Diagnosis not present

## 2016-05-15 DIAGNOSIS — D631 Anemia in chronic kidney disease: Secondary | ICD-10-CM | POA: Diagnosis not present

## 2016-05-15 DIAGNOSIS — N186 End stage renal disease: Secondary | ICD-10-CM | POA: Diagnosis not present

## 2016-05-15 DIAGNOSIS — Z992 Dependence on renal dialysis: Secondary | ICD-10-CM | POA: Diagnosis not present

## 2016-05-19 DIAGNOSIS — D509 Iron deficiency anemia, unspecified: Secondary | ICD-10-CM | POA: Diagnosis not present

## 2016-05-19 DIAGNOSIS — D631 Anemia in chronic kidney disease: Secondary | ICD-10-CM | POA: Diagnosis not present

## 2016-05-19 DIAGNOSIS — N186 End stage renal disease: Secondary | ICD-10-CM | POA: Diagnosis not present

## 2016-05-19 DIAGNOSIS — E875 Hyperkalemia: Secondary | ICD-10-CM | POA: Diagnosis not present

## 2016-05-19 DIAGNOSIS — N2581 Secondary hyperparathyroidism of renal origin: Secondary | ICD-10-CM | POA: Diagnosis not present

## 2016-05-20 DIAGNOSIS — D509 Iron deficiency anemia, unspecified: Secondary | ICD-10-CM | POA: Diagnosis not present

## 2016-05-20 DIAGNOSIS — N186 End stage renal disease: Secondary | ICD-10-CM | POA: Diagnosis not present

## 2016-05-20 DIAGNOSIS — E875 Hyperkalemia: Secondary | ICD-10-CM | POA: Diagnosis not present

## 2016-05-20 DIAGNOSIS — D631 Anemia in chronic kidney disease: Secondary | ICD-10-CM | POA: Diagnosis not present

## 2016-05-20 DIAGNOSIS — N2581 Secondary hyperparathyroidism of renal origin: Secondary | ICD-10-CM | POA: Diagnosis not present

## 2016-05-23 DIAGNOSIS — E875 Hyperkalemia: Secondary | ICD-10-CM | POA: Diagnosis not present

## 2016-05-23 DIAGNOSIS — N2581 Secondary hyperparathyroidism of renal origin: Secondary | ICD-10-CM | POA: Diagnosis not present

## 2016-05-23 DIAGNOSIS — N186 End stage renal disease: Secondary | ICD-10-CM | POA: Diagnosis not present

## 2016-05-23 DIAGNOSIS — D631 Anemia in chronic kidney disease: Secondary | ICD-10-CM | POA: Diagnosis not present

## 2016-05-23 DIAGNOSIS — D509 Iron deficiency anemia, unspecified: Secondary | ICD-10-CM | POA: Diagnosis not present

## 2016-05-25 DIAGNOSIS — N2581 Secondary hyperparathyroidism of renal origin: Secondary | ICD-10-CM | POA: Diagnosis not present

## 2016-05-25 DIAGNOSIS — N186 End stage renal disease: Secondary | ICD-10-CM | POA: Diagnosis not present

## 2016-05-25 DIAGNOSIS — E875 Hyperkalemia: Secondary | ICD-10-CM | POA: Diagnosis not present

## 2016-05-25 DIAGNOSIS — D509 Iron deficiency anemia, unspecified: Secondary | ICD-10-CM | POA: Diagnosis not present

## 2016-05-25 DIAGNOSIS — D631 Anemia in chronic kidney disease: Secondary | ICD-10-CM | POA: Diagnosis not present

## 2016-05-27 DIAGNOSIS — D509 Iron deficiency anemia, unspecified: Secondary | ICD-10-CM | POA: Diagnosis not present

## 2016-05-27 DIAGNOSIS — N186 End stage renal disease: Secondary | ICD-10-CM | POA: Diagnosis not present

## 2016-05-27 DIAGNOSIS — N2581 Secondary hyperparathyroidism of renal origin: Secondary | ICD-10-CM | POA: Diagnosis not present

## 2016-05-27 DIAGNOSIS — D631 Anemia in chronic kidney disease: Secondary | ICD-10-CM | POA: Diagnosis not present

## 2016-05-27 DIAGNOSIS — E875 Hyperkalemia: Secondary | ICD-10-CM | POA: Diagnosis not present

## 2016-05-30 DIAGNOSIS — D509 Iron deficiency anemia, unspecified: Secondary | ICD-10-CM | POA: Diagnosis not present

## 2016-05-30 DIAGNOSIS — N2581 Secondary hyperparathyroidism of renal origin: Secondary | ICD-10-CM | POA: Diagnosis not present

## 2016-05-30 DIAGNOSIS — N186 End stage renal disease: Secondary | ICD-10-CM | POA: Diagnosis not present

## 2016-05-30 DIAGNOSIS — D631 Anemia in chronic kidney disease: Secondary | ICD-10-CM | POA: Diagnosis not present

## 2016-05-30 DIAGNOSIS — E875 Hyperkalemia: Secondary | ICD-10-CM | POA: Diagnosis not present

## 2016-06-01 DIAGNOSIS — D509 Iron deficiency anemia, unspecified: Secondary | ICD-10-CM | POA: Diagnosis not present

## 2016-06-01 DIAGNOSIS — E875 Hyperkalemia: Secondary | ICD-10-CM | POA: Diagnosis not present

## 2016-06-01 DIAGNOSIS — N186 End stage renal disease: Secondary | ICD-10-CM | POA: Diagnosis not present

## 2016-06-01 DIAGNOSIS — D631 Anemia in chronic kidney disease: Secondary | ICD-10-CM | POA: Diagnosis not present

## 2016-06-01 DIAGNOSIS — N2581 Secondary hyperparathyroidism of renal origin: Secondary | ICD-10-CM | POA: Diagnosis not present

## 2016-06-04 DIAGNOSIS — N186 End stage renal disease: Secondary | ICD-10-CM | POA: Diagnosis not present

## 2016-06-04 DIAGNOSIS — D631 Anemia in chronic kidney disease: Secondary | ICD-10-CM | POA: Diagnosis not present

## 2016-06-04 DIAGNOSIS — E875 Hyperkalemia: Secondary | ICD-10-CM | POA: Diagnosis not present

## 2016-06-04 DIAGNOSIS — D509 Iron deficiency anemia, unspecified: Secondary | ICD-10-CM | POA: Diagnosis not present

## 2016-06-04 DIAGNOSIS — N2581 Secondary hyperparathyroidism of renal origin: Secondary | ICD-10-CM | POA: Diagnosis not present

## 2016-06-06 DIAGNOSIS — N2581 Secondary hyperparathyroidism of renal origin: Secondary | ICD-10-CM | POA: Diagnosis not present

## 2016-06-06 DIAGNOSIS — D631 Anemia in chronic kidney disease: Secondary | ICD-10-CM | POA: Diagnosis not present

## 2016-06-06 DIAGNOSIS — D509 Iron deficiency anemia, unspecified: Secondary | ICD-10-CM | POA: Diagnosis not present

## 2016-06-06 DIAGNOSIS — E875 Hyperkalemia: Secondary | ICD-10-CM | POA: Diagnosis not present

## 2016-06-06 DIAGNOSIS — N186 End stage renal disease: Secondary | ICD-10-CM | POA: Diagnosis not present

## 2016-06-09 DIAGNOSIS — N2581 Secondary hyperparathyroidism of renal origin: Secondary | ICD-10-CM | POA: Diagnosis not present

## 2016-06-09 DIAGNOSIS — N186 End stage renal disease: Secondary | ICD-10-CM | POA: Diagnosis not present

## 2016-06-09 DIAGNOSIS — D509 Iron deficiency anemia, unspecified: Secondary | ICD-10-CM | POA: Diagnosis not present

## 2016-06-09 DIAGNOSIS — D631 Anemia in chronic kidney disease: Secondary | ICD-10-CM | POA: Diagnosis not present

## 2016-06-09 DIAGNOSIS — E875 Hyperkalemia: Secondary | ICD-10-CM | POA: Diagnosis not present

## 2016-06-10 DIAGNOSIS — N2581 Secondary hyperparathyroidism of renal origin: Secondary | ICD-10-CM | POA: Diagnosis not present

## 2016-06-10 DIAGNOSIS — E875 Hyperkalemia: Secondary | ICD-10-CM | POA: Diagnosis not present

## 2016-06-10 DIAGNOSIS — N186 End stage renal disease: Secondary | ICD-10-CM | POA: Diagnosis not present

## 2016-06-10 DIAGNOSIS — D509 Iron deficiency anemia, unspecified: Secondary | ICD-10-CM | POA: Diagnosis not present

## 2016-06-10 DIAGNOSIS — D631 Anemia in chronic kidney disease: Secondary | ICD-10-CM | POA: Diagnosis not present

## 2016-06-13 DIAGNOSIS — D509 Iron deficiency anemia, unspecified: Secondary | ICD-10-CM | POA: Diagnosis not present

## 2016-06-13 DIAGNOSIS — D631 Anemia in chronic kidney disease: Secondary | ICD-10-CM | POA: Diagnosis not present

## 2016-06-13 DIAGNOSIS — E875 Hyperkalemia: Secondary | ICD-10-CM | POA: Diagnosis not present

## 2016-06-13 DIAGNOSIS — N2581 Secondary hyperparathyroidism of renal origin: Secondary | ICD-10-CM | POA: Diagnosis not present

## 2016-06-13 DIAGNOSIS — N186 End stage renal disease: Secondary | ICD-10-CM | POA: Diagnosis not present

## 2016-06-15 DIAGNOSIS — N186 End stage renal disease: Secondary | ICD-10-CM | POA: Diagnosis not present

## 2016-06-15 DIAGNOSIS — I10 Essential (primary) hypertension: Secondary | ICD-10-CM | POA: Diagnosis not present

## 2016-06-15 DIAGNOSIS — Z992 Dependence on renal dialysis: Secondary | ICD-10-CM | POA: Diagnosis not present

## 2016-06-16 DIAGNOSIS — N2581 Secondary hyperparathyroidism of renal origin: Secondary | ICD-10-CM | POA: Diagnosis not present

## 2016-06-16 DIAGNOSIS — D631 Anemia in chronic kidney disease: Secondary | ICD-10-CM | POA: Diagnosis not present

## 2016-06-16 DIAGNOSIS — N186 End stage renal disease: Secondary | ICD-10-CM | POA: Diagnosis not present

## 2016-06-16 DIAGNOSIS — E8770 Fluid overload, unspecified: Secondary | ICD-10-CM | POA: Diagnosis not present

## 2016-06-16 DIAGNOSIS — E875 Hyperkalemia: Secondary | ICD-10-CM | POA: Diagnosis not present

## 2016-06-16 DIAGNOSIS — D509 Iron deficiency anemia, unspecified: Secondary | ICD-10-CM | POA: Diagnosis not present

## 2016-06-17 DIAGNOSIS — E875 Hyperkalemia: Secondary | ICD-10-CM | POA: Diagnosis not present

## 2016-06-17 DIAGNOSIS — N186 End stage renal disease: Secondary | ICD-10-CM | POA: Diagnosis not present

## 2016-06-17 DIAGNOSIS — E8770 Fluid overload, unspecified: Secondary | ICD-10-CM | POA: Diagnosis not present

## 2016-06-17 DIAGNOSIS — D509 Iron deficiency anemia, unspecified: Secondary | ICD-10-CM | POA: Diagnosis not present

## 2016-06-17 DIAGNOSIS — N2581 Secondary hyperparathyroidism of renal origin: Secondary | ICD-10-CM | POA: Diagnosis not present

## 2016-06-17 DIAGNOSIS — D631 Anemia in chronic kidney disease: Secondary | ICD-10-CM | POA: Diagnosis not present

## 2016-06-20 DIAGNOSIS — E875 Hyperkalemia: Secondary | ICD-10-CM | POA: Diagnosis not present

## 2016-06-20 DIAGNOSIS — D631 Anemia in chronic kidney disease: Secondary | ICD-10-CM | POA: Diagnosis not present

## 2016-06-20 DIAGNOSIS — D509 Iron deficiency anemia, unspecified: Secondary | ICD-10-CM | POA: Diagnosis not present

## 2016-06-20 DIAGNOSIS — N2581 Secondary hyperparathyroidism of renal origin: Secondary | ICD-10-CM | POA: Diagnosis not present

## 2016-06-20 DIAGNOSIS — E8770 Fluid overload, unspecified: Secondary | ICD-10-CM | POA: Diagnosis not present

## 2016-06-20 DIAGNOSIS — N186 End stage renal disease: Secondary | ICD-10-CM | POA: Diagnosis not present

## 2016-06-24 DIAGNOSIS — D509 Iron deficiency anemia, unspecified: Secondary | ICD-10-CM | POA: Diagnosis not present

## 2016-06-24 DIAGNOSIS — E8770 Fluid overload, unspecified: Secondary | ICD-10-CM | POA: Diagnosis not present

## 2016-06-24 DIAGNOSIS — E875 Hyperkalemia: Secondary | ICD-10-CM | POA: Diagnosis not present

## 2016-06-24 DIAGNOSIS — N2581 Secondary hyperparathyroidism of renal origin: Secondary | ICD-10-CM | POA: Diagnosis not present

## 2016-06-24 DIAGNOSIS — N186 End stage renal disease: Secondary | ICD-10-CM | POA: Diagnosis not present

## 2016-06-24 DIAGNOSIS — D631 Anemia in chronic kidney disease: Secondary | ICD-10-CM | POA: Diagnosis not present

## 2016-06-25 DIAGNOSIS — D631 Anemia in chronic kidney disease: Secondary | ICD-10-CM | POA: Diagnosis not present

## 2016-06-25 DIAGNOSIS — E8770 Fluid overload, unspecified: Secondary | ICD-10-CM | POA: Diagnosis not present

## 2016-06-25 DIAGNOSIS — N2581 Secondary hyperparathyroidism of renal origin: Secondary | ICD-10-CM | POA: Diagnosis not present

## 2016-06-25 DIAGNOSIS — E875 Hyperkalemia: Secondary | ICD-10-CM | POA: Diagnosis not present

## 2016-06-25 DIAGNOSIS — D509 Iron deficiency anemia, unspecified: Secondary | ICD-10-CM | POA: Diagnosis not present

## 2016-06-25 DIAGNOSIS — N186 End stage renal disease: Secondary | ICD-10-CM | POA: Diagnosis not present

## 2016-06-27 DIAGNOSIS — E875 Hyperkalemia: Secondary | ICD-10-CM | POA: Diagnosis not present

## 2016-06-27 DIAGNOSIS — D631 Anemia in chronic kidney disease: Secondary | ICD-10-CM | POA: Diagnosis not present

## 2016-06-27 DIAGNOSIS — D509 Iron deficiency anemia, unspecified: Secondary | ICD-10-CM | POA: Diagnosis not present

## 2016-06-27 DIAGNOSIS — E8770 Fluid overload, unspecified: Secondary | ICD-10-CM | POA: Diagnosis not present

## 2016-06-27 DIAGNOSIS — N2581 Secondary hyperparathyroidism of renal origin: Secondary | ICD-10-CM | POA: Diagnosis not present

## 2016-06-27 DIAGNOSIS — N186 End stage renal disease: Secondary | ICD-10-CM | POA: Diagnosis not present

## 2016-07-01 DIAGNOSIS — D631 Anemia in chronic kidney disease: Secondary | ICD-10-CM | POA: Diagnosis not present

## 2016-07-01 DIAGNOSIS — D509 Iron deficiency anemia, unspecified: Secondary | ICD-10-CM | POA: Diagnosis not present

## 2016-07-01 DIAGNOSIS — E8770 Fluid overload, unspecified: Secondary | ICD-10-CM | POA: Diagnosis not present

## 2016-07-01 DIAGNOSIS — N2581 Secondary hyperparathyroidism of renal origin: Secondary | ICD-10-CM | POA: Diagnosis not present

## 2016-07-01 DIAGNOSIS — N186 End stage renal disease: Secondary | ICD-10-CM | POA: Diagnosis not present

## 2016-07-01 DIAGNOSIS — E875 Hyperkalemia: Secondary | ICD-10-CM | POA: Diagnosis not present

## 2016-07-04 DIAGNOSIS — D509 Iron deficiency anemia, unspecified: Secondary | ICD-10-CM | POA: Diagnosis not present

## 2016-07-04 DIAGNOSIS — N186 End stage renal disease: Secondary | ICD-10-CM | POA: Diagnosis not present

## 2016-07-04 DIAGNOSIS — E8770 Fluid overload, unspecified: Secondary | ICD-10-CM | POA: Diagnosis not present

## 2016-07-04 DIAGNOSIS — D631 Anemia in chronic kidney disease: Secondary | ICD-10-CM | POA: Diagnosis not present

## 2016-07-04 DIAGNOSIS — N2581 Secondary hyperparathyroidism of renal origin: Secondary | ICD-10-CM | POA: Diagnosis not present

## 2016-07-04 DIAGNOSIS — E875 Hyperkalemia: Secondary | ICD-10-CM | POA: Diagnosis not present

## 2016-07-06 DIAGNOSIS — N186 End stage renal disease: Secondary | ICD-10-CM | POA: Diagnosis not present

## 2016-07-06 DIAGNOSIS — D631 Anemia in chronic kidney disease: Secondary | ICD-10-CM | POA: Diagnosis not present

## 2016-07-06 DIAGNOSIS — N2581 Secondary hyperparathyroidism of renal origin: Secondary | ICD-10-CM | POA: Diagnosis not present

## 2016-07-06 DIAGNOSIS — E8770 Fluid overload, unspecified: Secondary | ICD-10-CM | POA: Diagnosis not present

## 2016-07-06 DIAGNOSIS — E875 Hyperkalemia: Secondary | ICD-10-CM | POA: Diagnosis not present

## 2016-07-06 DIAGNOSIS — D509 Iron deficiency anemia, unspecified: Secondary | ICD-10-CM | POA: Diagnosis not present

## 2016-07-07 DIAGNOSIS — E875 Hyperkalemia: Secondary | ICD-10-CM | POA: Diagnosis not present

## 2016-07-07 DIAGNOSIS — D631 Anemia in chronic kidney disease: Secondary | ICD-10-CM | POA: Diagnosis not present

## 2016-07-07 DIAGNOSIS — D509 Iron deficiency anemia, unspecified: Secondary | ICD-10-CM | POA: Diagnosis not present

## 2016-07-07 DIAGNOSIS — N186 End stage renal disease: Secondary | ICD-10-CM | POA: Diagnosis not present

## 2016-07-07 DIAGNOSIS — E8770 Fluid overload, unspecified: Secondary | ICD-10-CM | POA: Diagnosis not present

## 2016-07-07 DIAGNOSIS — N2581 Secondary hyperparathyroidism of renal origin: Secondary | ICD-10-CM | POA: Diagnosis not present

## 2016-07-11 DIAGNOSIS — D509 Iron deficiency anemia, unspecified: Secondary | ICD-10-CM | POA: Diagnosis not present

## 2016-07-11 DIAGNOSIS — N2581 Secondary hyperparathyroidism of renal origin: Secondary | ICD-10-CM | POA: Diagnosis not present

## 2016-07-11 DIAGNOSIS — E8770 Fluid overload, unspecified: Secondary | ICD-10-CM | POA: Diagnosis not present

## 2016-07-11 DIAGNOSIS — D631 Anemia in chronic kidney disease: Secondary | ICD-10-CM | POA: Diagnosis not present

## 2016-07-11 DIAGNOSIS — N186 End stage renal disease: Secondary | ICD-10-CM | POA: Diagnosis not present

## 2016-07-11 DIAGNOSIS — E875 Hyperkalemia: Secondary | ICD-10-CM | POA: Diagnosis not present

## 2016-07-13 DIAGNOSIS — D509 Iron deficiency anemia, unspecified: Secondary | ICD-10-CM | POA: Diagnosis not present

## 2016-07-13 DIAGNOSIS — E8770 Fluid overload, unspecified: Secondary | ICD-10-CM | POA: Diagnosis not present

## 2016-07-13 DIAGNOSIS — I10 Essential (primary) hypertension: Secondary | ICD-10-CM | POA: Diagnosis not present

## 2016-07-13 DIAGNOSIS — N186 End stage renal disease: Secondary | ICD-10-CM | POA: Diagnosis not present

## 2016-07-13 DIAGNOSIS — D631 Anemia in chronic kidney disease: Secondary | ICD-10-CM | POA: Diagnosis not present

## 2016-07-13 DIAGNOSIS — E875 Hyperkalemia: Secondary | ICD-10-CM | POA: Diagnosis not present

## 2016-07-13 DIAGNOSIS — Z992 Dependence on renal dialysis: Secondary | ICD-10-CM | POA: Diagnosis not present

## 2016-07-13 DIAGNOSIS — N2581 Secondary hyperparathyroidism of renal origin: Secondary | ICD-10-CM | POA: Diagnosis not present

## 2016-07-14 DIAGNOSIS — N2581 Secondary hyperparathyroidism of renal origin: Secondary | ICD-10-CM | POA: Diagnosis not present

## 2016-07-14 DIAGNOSIS — E875 Hyperkalemia: Secondary | ICD-10-CM | POA: Diagnosis not present

## 2016-07-14 DIAGNOSIS — D631 Anemia in chronic kidney disease: Secondary | ICD-10-CM | POA: Diagnosis not present

## 2016-07-14 DIAGNOSIS — D509 Iron deficiency anemia, unspecified: Secondary | ICD-10-CM | POA: Diagnosis not present

## 2016-07-14 DIAGNOSIS — N186 End stage renal disease: Secondary | ICD-10-CM | POA: Diagnosis not present

## 2016-07-19 DIAGNOSIS — N186 End stage renal disease: Secondary | ICD-10-CM | POA: Diagnosis not present

## 2016-07-19 DIAGNOSIS — D631 Anemia in chronic kidney disease: Secondary | ICD-10-CM | POA: Diagnosis not present

## 2016-07-19 DIAGNOSIS — D509 Iron deficiency anemia, unspecified: Secondary | ICD-10-CM | POA: Diagnosis not present

## 2016-07-19 DIAGNOSIS — E875 Hyperkalemia: Secondary | ICD-10-CM | POA: Diagnosis not present

## 2016-07-19 DIAGNOSIS — N2581 Secondary hyperparathyroidism of renal origin: Secondary | ICD-10-CM | POA: Diagnosis not present

## 2016-07-20 DIAGNOSIS — N186 End stage renal disease: Secondary | ICD-10-CM | POA: Diagnosis not present

## 2016-07-20 DIAGNOSIS — D509 Iron deficiency anemia, unspecified: Secondary | ICD-10-CM | POA: Diagnosis not present

## 2016-07-20 DIAGNOSIS — E875 Hyperkalemia: Secondary | ICD-10-CM | POA: Diagnosis not present

## 2016-07-20 DIAGNOSIS — N2581 Secondary hyperparathyroidism of renal origin: Secondary | ICD-10-CM | POA: Diagnosis not present

## 2016-07-20 DIAGNOSIS — D631 Anemia in chronic kidney disease: Secondary | ICD-10-CM | POA: Diagnosis not present

## 2016-07-25 DIAGNOSIS — N2581 Secondary hyperparathyroidism of renal origin: Secondary | ICD-10-CM | POA: Diagnosis not present

## 2016-07-25 DIAGNOSIS — D509 Iron deficiency anemia, unspecified: Secondary | ICD-10-CM | POA: Diagnosis not present

## 2016-07-25 DIAGNOSIS — D631 Anemia in chronic kidney disease: Secondary | ICD-10-CM | POA: Diagnosis not present

## 2016-07-25 DIAGNOSIS — N186 End stage renal disease: Secondary | ICD-10-CM | POA: Diagnosis not present

## 2016-07-25 DIAGNOSIS — E875 Hyperkalemia: Secondary | ICD-10-CM | POA: Diagnosis not present

## 2016-07-27 DIAGNOSIS — N2581 Secondary hyperparathyroidism of renal origin: Secondary | ICD-10-CM | POA: Diagnosis not present

## 2016-07-27 DIAGNOSIS — E875 Hyperkalemia: Secondary | ICD-10-CM | POA: Diagnosis not present

## 2016-07-27 DIAGNOSIS — N186 End stage renal disease: Secondary | ICD-10-CM | POA: Diagnosis not present

## 2016-07-27 DIAGNOSIS — D509 Iron deficiency anemia, unspecified: Secondary | ICD-10-CM | POA: Diagnosis not present

## 2016-07-27 DIAGNOSIS — D631 Anemia in chronic kidney disease: Secondary | ICD-10-CM | POA: Diagnosis not present

## 2016-07-30 DIAGNOSIS — N2581 Secondary hyperparathyroidism of renal origin: Secondary | ICD-10-CM | POA: Diagnosis not present

## 2016-07-30 DIAGNOSIS — N186 End stage renal disease: Secondary | ICD-10-CM | POA: Diagnosis not present

## 2016-08-01 DIAGNOSIS — N2581 Secondary hyperparathyroidism of renal origin: Secondary | ICD-10-CM | POA: Diagnosis not present

## 2016-08-01 DIAGNOSIS — E875 Hyperkalemia: Secondary | ICD-10-CM | POA: Diagnosis not present

## 2016-08-01 DIAGNOSIS — N186 End stage renal disease: Secondary | ICD-10-CM | POA: Diagnosis not present

## 2016-08-01 DIAGNOSIS — D631 Anemia in chronic kidney disease: Secondary | ICD-10-CM | POA: Diagnosis not present

## 2016-08-01 DIAGNOSIS — D509 Iron deficiency anemia, unspecified: Secondary | ICD-10-CM | POA: Diagnosis not present

## 2016-08-02 ENCOUNTER — Inpatient Hospital Stay (HOSPITAL_COMMUNITY)
Admission: EM | Admit: 2016-08-02 | Discharge: 2016-08-05 | DRG: 640 | Disposition: A | Discharge status: Home / Self Care | Payer: Medicare Other | Attending: Infectious Disease | Admitting: Infectious Disease

## 2016-08-02 ENCOUNTER — Inpatient Hospital Stay (HOSPITAL_COMMUNITY): Payer: Medicare Other

## 2016-08-02 ENCOUNTER — Emergency Department (HOSPITAL_COMMUNITY): Payer: Medicare Other

## 2016-08-02 ENCOUNTER — Encounter (HOSPITAL_COMMUNITY): Payer: Self-pay

## 2016-08-02 DIAGNOSIS — F17210 Nicotine dependence, cigarettes, uncomplicated: Secondary | ICD-10-CM | POA: Diagnosis present

## 2016-08-02 DIAGNOSIS — E8889 Other specified metabolic disorders: Secondary | ICD-10-CM | POA: Diagnosis present

## 2016-08-02 DIAGNOSIS — I120 Hypertensive chronic kidney disease with stage 5 chronic kidney disease or end stage renal disease: Secondary | ICD-10-CM | POA: Diagnosis not present

## 2016-08-02 DIAGNOSIS — J9600 Acute respiratory failure, unspecified whether with hypoxia or hypercapnia: Secondary | ICD-10-CM

## 2016-08-02 DIAGNOSIS — J449 Chronic obstructive pulmonary disease, unspecified: Secondary | ICD-10-CM | POA: Diagnosis present

## 2016-08-02 DIAGNOSIS — N186 End stage renal disease: Secondary | ICD-10-CM | POA: Diagnosis not present

## 2016-08-02 DIAGNOSIS — R0602 Shortness of breath: Secondary | ICD-10-CM | POA: Diagnosis not present

## 2016-08-02 DIAGNOSIS — F1420 Cocaine dependence, uncomplicated: Secondary | ICD-10-CM | POA: Diagnosis not present

## 2016-08-02 DIAGNOSIS — I5032 Chronic diastolic (congestive) heart failure: Secondary | ICD-10-CM | POA: Diagnosis present

## 2016-08-02 DIAGNOSIS — N2581 Secondary hyperparathyroidism of renal origin: Secondary | ICD-10-CM | POA: Diagnosis not present

## 2016-08-02 DIAGNOSIS — D631 Anemia in chronic kidney disease: Secondary | ICD-10-CM

## 2016-08-02 DIAGNOSIS — Z9115 Patient's noncompliance with renal dialysis: Secondary | ICD-10-CM | POA: Diagnosis not present

## 2016-08-02 DIAGNOSIS — J42 Unspecified chronic bronchitis: Secondary | ICD-10-CM | POA: Diagnosis not present

## 2016-08-02 DIAGNOSIS — Z7982 Long term (current) use of aspirin: Secondary | ICD-10-CM

## 2016-08-02 DIAGNOSIS — I776 Arteritis, unspecified: Secondary | ICD-10-CM | POA: Diagnosis present

## 2016-08-02 DIAGNOSIS — I509 Heart failure, unspecified: Secondary | ICD-10-CM | POA: Diagnosis not present

## 2016-08-02 DIAGNOSIS — Z992 Dependence on renal dialysis: Secondary | ICD-10-CM

## 2016-08-02 DIAGNOSIS — Z79899 Other long term (current) drug therapy: Secondary | ICD-10-CM | POA: Diagnosis not present

## 2016-08-02 DIAGNOSIS — R0600 Dyspnea, unspecified: Secondary | ICD-10-CM

## 2016-08-02 DIAGNOSIS — J9690 Respiratory failure, unspecified, unspecified whether with hypoxia or hypercapnia: Secondary | ICD-10-CM

## 2016-08-02 DIAGNOSIS — J811 Chronic pulmonary edema: Secondary | ICD-10-CM

## 2016-08-02 DIAGNOSIS — F1410 Cocaine abuse, uncomplicated: Secondary | ICD-10-CM | POA: Diagnosis present

## 2016-08-02 DIAGNOSIS — E8770 Fluid overload, unspecified: Principal | ICD-10-CM

## 2016-08-02 DIAGNOSIS — Z8679 Personal history of other diseases of the circulatory system: Secondary | ICD-10-CM

## 2016-08-02 DIAGNOSIS — T374X5A Adverse effect of anthelminthics, initial encounter: Secondary | ICD-10-CM | POA: Diagnosis present

## 2016-08-02 DIAGNOSIS — I132 Hypertensive heart and chronic kidney disease with heart failure and with stage 5 chronic kidney disease, or end stage renal disease: Secondary | ICD-10-CM | POA: Diagnosis present

## 2016-08-02 DIAGNOSIS — F1220 Cannabis dependence, uncomplicated: Secondary | ICD-10-CM | POA: Diagnosis not present

## 2016-08-02 DIAGNOSIS — Z09 Encounter for follow-up examination after completed treatment for conditions other than malignant neoplasm: Secondary | ICD-10-CM

## 2016-08-02 DIAGNOSIS — E875 Hyperkalemia: Secondary | ICD-10-CM | POA: Diagnosis not present

## 2016-08-02 DIAGNOSIS — J9601 Acute respiratory failure with hypoxia: Secondary | ICD-10-CM | POA: Diagnosis present

## 2016-08-02 DIAGNOSIS — F1490 Cocaine use, unspecified, uncomplicated: Secondary | ICD-10-CM | POA: Diagnosis not present

## 2016-08-02 DIAGNOSIS — Z8249 Family history of ischemic heart disease and other diseases of the circulatory system: Secondary | ICD-10-CM

## 2016-08-02 DIAGNOSIS — F14288 Cocaine dependence with other cocaine-induced disorder: Secondary | ICD-10-CM | POA: Diagnosis not present

## 2016-08-02 DIAGNOSIS — N189 Chronic kidney disease, unspecified: Secondary | ICD-10-CM

## 2016-08-02 DIAGNOSIS — R042 Hemoptysis: Secondary | ICD-10-CM

## 2016-08-02 HISTORY — DX: Unspecified convulsions: R56.9

## 2016-08-02 HISTORY — DX: Heart failure, unspecified: I50.9

## 2016-08-02 LAB — CBC
HCT: 28.9 % — ABNORMAL LOW (ref 39.0–52.0)
HEMATOCRIT: 28.8 % — AB (ref 39.0–52.0)
HEMOGLOBIN: 8.9 g/dL — AB (ref 13.0–17.0)
Hemoglobin: 8.9 g/dL — ABNORMAL LOW (ref 13.0–17.0)
MCH: 29.2 pg (ref 26.0–34.0)
MCH: 29.1 pg (ref 26.0–34.0)
MCHC: 30.9 g/dL (ref 30.0–36.0)
MCHC: 30.8 g/dL (ref 30.0–36.0)
MCV: 94.4 fL (ref 78.0–100.0)
MCV: 94.4 fL (ref 78.0–100.0)
Platelets: 154 10*3/uL (ref 150–400)
Platelets: 153 10*3/uL (ref 150–400)
RBC: 3.05 MIL/uL — AB (ref 4.22–5.81)
RBC: 3.06 MIL/uL — AB (ref 4.22–5.81)
RDW: 16.2 % — ABNORMAL HIGH (ref 11.5–15.5)
RDW: 16.1 % — AB (ref 11.5–15.5)
WBC: 10 10*3/uL (ref 4.0–10.5)
WBC: 10.5 10*3/uL (ref 4.0–10.5)

## 2016-08-02 LAB — TYPE AND SCREEN
ABO/RH(D): O POS
ANTIBODY SCREEN: NEGATIVE

## 2016-08-02 LAB — BLOOD GAS, ARTERIAL
Acid-Base Excess: 8.2 mmol/L — ABNORMAL HIGH (ref 0.0–2.0)
BICARBONATE: 32.2 mmol/L — AB (ref 20.0–28.0)
Drawn by: 39898
FIO2: 100
O2 SAT: 94.1 %
PATIENT TEMPERATURE: 98.6
PCO2 ART: 44.4 mmHg (ref 32.0–48.0)
PO2 ART: 73.3 mmHg — AB (ref 83.0–108.0)
pH, Arterial: 7.474 — ABNORMAL HIGH (ref 7.350–7.450)

## 2016-08-02 LAB — HEPATIC FUNCTION PANEL
ALT: 19 U/L (ref 17–63)
AST: 19 U/L (ref 15–41)
Albumin: 3.7 g/dL (ref 3.5–5.0)
Alkaline Phosphatase: 121 U/L (ref 38–126)
BILIRUBIN INDIRECT: 0.5 mg/dL (ref 0.3–0.9)
Bilirubin, Direct: 0.1 mg/dL (ref 0.1–0.5)
TOTAL PROTEIN: 6.7 g/dL (ref 6.5–8.1)
Total Bilirubin: 0.6 mg/dL (ref 0.3–1.2)

## 2016-08-02 LAB — PROTIME-INR
INR: 1.26
Prothrombin Time: 15.9 seconds — ABNORMAL HIGH (ref 11.4–15.2)

## 2016-08-02 LAB — BASIC METABOLIC PANEL
ANION GAP: 19 — AB (ref 5–15)
BUN: 74 mg/dL — ABNORMAL HIGH (ref 6–20)
CALCIUM: 9 mg/dL (ref 8.9–10.3)
CO2: 24 mmol/L (ref 22–32)
Chloride: 91 mmol/L — ABNORMAL LOW (ref 101–111)
Creatinine, Ser: 11.5 mg/dL — ABNORMAL HIGH (ref 0.61–1.24)
GFR calc non Af Amer: 4 mL/min — ABNORMAL LOW (ref >60)
GFR, EST AFRICAN AMERICAN: 5 mL/min — AB (ref >60)
GLUCOSE: 105 mg/dL — AB (ref 65–99)
Potassium: 6.8 mmol/L (ref 3.5–5.1)
Sodium: 134 mmol/L — ABNORMAL LOW (ref 135–145)

## 2016-08-02 LAB — I-STAT TROPONIN, ED: TROPONIN I, POC: 0.01 ng/mL (ref 0.00–0.08)

## 2016-08-02 LAB — APTT: APTT: 41 s — AB (ref 24–36)

## 2016-08-02 LAB — BRAIN NATRIURETIC PEPTIDE: B NATRIURETIC PEPTIDE 5: 3739.8 pg/mL — AB (ref 0.0–100.0)

## 2016-08-02 MED ORDER — SODIUM CHLORIDE 0.9 % IV SOLN: 100.0000 mL | INTRAVENOUS | Status: DC | PRN

## 2016-08-02 MED ORDER — HEPARIN SODIUM (PORCINE) 1000 UNIT/ML DIALYSIS: 1000.0000 [IU] | INTRAMUSCULAR | Status: DC | PRN

## 2016-08-02 MED ORDER — ACETAMINOPHEN 650 MG RE SUPP: 650.0000 mg | Freq: Four times a day (QID) | RECTAL | Status: DC | PRN

## 2016-08-02 MED ORDER — DOXERCALCIFEROL 4 MCG/2ML IV SOLN
4.0000 ug | INTRAVENOUS | Status: DC
  Administered 2016-08-04 – 2016-08-05 (×2): 4 ug via INTRAVENOUS
  Filled 2016-08-02 (×2): qty 2

## 2016-08-02 MED ORDER — HEPARIN SODIUM (PORCINE) 1000 UNIT/ML DIALYSIS
40.0000 [IU]/kg | Freq: Once | INTRAMUSCULAR | Status: DC
  Filled 2016-08-02: qty 3

## 2016-08-02 MED ORDER — POLYETHYLENE GLYCOL 3350 17 G PO PACK: 17.0000 g | PACK | Freq: Every day | ORAL | Status: DC | PRN

## 2016-08-02 MED ORDER — ALTEPLASE 2 MG IJ SOLR: 2.0000 mg | Freq: Once | INTRAMUSCULAR | Status: DC | PRN

## 2016-08-02 MED ORDER — IPRATROPIUM-ALBUTEROL 0.5-2.5 (3) MG/3ML IN SOLN
3.0000 mL | Freq: Once | RESPIRATORY_TRACT | Status: AC
  Administered 2016-08-02: 3 mL via RESPIRATORY_TRACT
  Filled 2016-08-02: qty 3

## 2016-08-02 MED ORDER — PENTAFLUOROPROP-TETRAFLUOROETH EX AERO: 1.0000 "application " | INHALATION_SPRAY | CUTANEOUS | Status: DC | PRN

## 2016-08-02 MED ORDER — LIDOCAINE HCL (PF) 1 % IJ SOLN: 5.0000 mL | INTRAMUSCULAR | Status: DC | PRN

## 2016-08-02 MED ORDER — DARBEPOETIN ALFA 200 MCG/0.4ML IJ SOSY
200.0000 ug | PREFILLED_SYRINGE | INTRAMUSCULAR | Status: DC
  Filled 2016-08-02: qty 0.4

## 2016-08-02 MED ORDER — SEVELAMER CARBONATE 800 MG PO TABS: 2400.0000 mg | ORAL_TABLET | Freq: Three times a day (TID) | ORAL | Status: DC

## 2016-08-02 MED ORDER — HEPARIN SODIUM (PORCINE) 1000 UNIT/ML DIALYSIS
1000.0000 [IU] | INTRAMUSCULAR | Status: DC | PRN
  Filled 2016-08-02: qty 1

## 2016-08-02 MED ORDER — LOSARTAN POTASSIUM 50 MG PO TABS
100.0000 mg | ORAL_TABLET | Freq: Every day | ORAL | Status: DC
  Administered 2016-08-02 – 2016-08-04 (×2): 100 mg via ORAL
  Filled 2016-08-02 (×3): qty 2

## 2016-08-02 MED ORDER — LIDOCAINE-PRILOCAINE 2.5-2.5 % EX CREA: 1.0000 "application " | TOPICAL_CREAM | CUTANEOUS | Status: DC | PRN

## 2016-08-02 MED ORDER — CLONIDINE HCL 0.3 MG PO TABS
0.3000 mg | ORAL_TABLET | Freq: Three times a day (TID) | ORAL | Status: DC
  Administered 2016-08-02 – 2016-08-05 (×7): 0.3 mg via ORAL
  Filled 2016-08-02 (×9): qty 1

## 2016-08-02 MED ORDER — NEPRO/CARBSTEADY PO LIQD: 237.0000 mL | Freq: Three times a day (TID) | ORAL | Status: DC | PRN

## 2016-08-02 MED ORDER — SEVELAMER CARBONATE 800 MG PO TABS
3200.0000 mg | ORAL_TABLET | Freq: Three times a day (TID) | ORAL | Status: DC
Start: 2016-08-02 — End: 2016-08-05
  Administered 2016-08-02: 3200 mg via ORAL
  Administered 2016-08-03 (×2): 1600 mg via ORAL
  Administered 2016-08-03 – 2016-08-04 (×3): 3200 mg via ORAL
  Filled 2016-08-02 (×5): qty 4

## 2016-08-02 MED ORDER — HYDROXYZINE HCL 25 MG PO TABS
25.0000 mg | ORAL_TABLET | Freq: Three times a day (TID) | ORAL | Status: DC | PRN
  Administered 2016-08-04: 25 mg via ORAL
  Filled 2016-08-02 (×2): qty 1

## 2016-08-02 MED ORDER — LEVETIRACETAM 500 MG PO TABS
1000.0000 mg | ORAL_TABLET | Freq: Two times a day (BID) | ORAL | Status: DC
  Administered 2016-08-02: 1000 mg via ORAL
  Administered 2016-08-03: 500 mg via ORAL
  Administered 2016-08-03 – 2016-08-04 (×2): 1000 mg via ORAL
  Filled 2016-08-02 (×4): qty 2

## 2016-08-02 MED ORDER — LOSARTAN POTASSIUM 50 MG PO TABS: 100.0000 mg | ORAL_TABLET | Freq: Every day | ORAL | Status: DC

## 2016-08-02 MED ORDER — RENA-VITE PO TABS
1.0000 | ORAL_TABLET | Freq: Every day | ORAL | Status: DC
  Administered 2016-08-02 – 2016-08-04 (×3): 1 via ORAL
  Filled 2016-08-02 (×3): qty 1

## 2016-08-02 MED ORDER — ASPIRIN 81 MG PO CHEW: 81.0000 mg | CHEWABLE_TABLET | Freq: Every day | ORAL | Status: DC

## 2016-08-02 MED ORDER — AMLODIPINE BESYLATE 10 MG PO TABS
10.0000 mg | ORAL_TABLET | Freq: Every day | ORAL | Status: DC
  Administered 2016-08-03 – 2016-08-05 (×2): 10 mg via ORAL
  Filled 2016-08-02 (×2): qty 1

## 2016-08-02 MED ORDER — ONDANSETRON HCL 4 MG/2ML IJ SOLN: 4.0000 mg | Freq: Four times a day (QID) | INTRAMUSCULAR | Status: DC | PRN

## 2016-08-02 MED ORDER — ACETAMINOPHEN 325 MG PO TABS
650.0000 mg | ORAL_TABLET | Freq: Four times a day (QID) | ORAL | Status: DC | PRN
  Administered 2016-08-02: 650 mg via ORAL
  Filled 2016-08-02: qty 2

## 2016-08-02 MED ORDER — PANTOPRAZOLE SODIUM 20 MG PO TBEC
20.0000 mg | DELAYED_RELEASE_TABLET | Freq: Every day | ORAL | Status: DC
  Administered 2016-08-05: 20 mg via ORAL
  Filled 2016-08-02 (×3): qty 1

## 2016-08-02 MED ORDER — CINACALCET HCL 30 MG PO TABS
90.0000 mg | ORAL_TABLET | Freq: Every day | ORAL | Status: DC
  Administered 2016-08-02 – 2016-08-04 (×3): 90 mg via ORAL
  Filled 2016-08-02 (×3): qty 3

## 2016-08-02 MED ORDER — ONDANSETRON HCL 4 MG PO TABS: 4.0000 mg | ORAL_TABLET | Freq: Four times a day (QID) | ORAL | Status: DC | PRN

## 2016-08-02 MED ORDER — ACETAMINOPHEN 325 MG PO TABS
ORAL_TABLET | ORAL | Status: AC
  Administered 2016-08-02: 650 mg
  Filled 2016-08-02: qty 2

## 2016-08-02 MED ORDER — FAMOTIDINE 20 MG PO TABS
20.0000 mg | ORAL_TABLET | Freq: Two times a day (BID) | ORAL | Status: DC | PRN
  Administered 2016-08-02 – 2016-08-04 (×2): 20 mg via ORAL
  Filled 2016-08-02 (×2): qty 1

## 2016-08-02 MED ORDER — CARVEDILOL 25 MG PO TABS
25.0000 mg | ORAL_TABLET | Freq: Two times a day (BID) | ORAL | Status: DC
  Administered 2016-08-02 – 2016-08-04 (×4): 25 mg via ORAL
  Filled 2016-08-02 (×5): qty 1

## 2016-08-02 NOTE — Consult Note (Signed)
[facility] Renal Consultation Note    Indication for Consultation:  Management of ESRD/hemodialysis; anemia, hypertension/volume and secondary hyperparathyroidism  HPI: Jeremy Mccoy is a 51 y.o. male with ESRD on hemodialysis, HTN, COPD, h/o seizure disorder, chronic cocaine use, non adherence to dialysis rx. He presented to ED this morning with severe SOB and hemoptysis. Reports he began "spitting up blood" last night. Has had similar episodes in the past. Seen in ED requiring oxygen via NRB.  On ED admission labs significant for hyperkalemia K 6.8 CXR with vascular congestion and multiple small pulmonary nodular densities. Now in HD unit for emergent HD. Endorses SOB, chest pain orthopnea, HA, abdominal pain. "Hurts all over".   Dialyzes at [facility] MWF. Last HD was yesterday for 2h7mins. Severely volume overloaded 10kg over EDW pre HD and only able to tolerate 3L off yesterday. Frequently cuts treatments short because of HA during treatment. Was scheduled for extra treatment today.   Past Medical History:  Diagnosis Date  . Anemia   . Anxiety   . Arthritis    "qwhere" (05/15/2013)  . CHF (congestive heart failure) ([place])   . COPD (chronic obstructive pulmonary disease) ([place])   . Crack cocaine use   . Dental caries   . Depression   . End stage renal disease ([place]) 01/11/2012   Patient presented 11/04/11 to hospital with CP, wt loss and N/V. Creat was 10, admitted. 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 ANA was + 1:80, +MPO and +pr3 Ab's (ANCA). Received plasmapheresis x 7, IV cytoxan and pred taper. First HD was 11/17/11. 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 Pt d/c'd to do outpt HD and may have gotten 1-2 additional Cytoxan as OP, but this was stopped eventually since renal function didn't recover. Now gets HD TTS  schedule at [facility].  L forearm AVF 11/19/11 by Dr. Scot Dock is current access.    Marland Kitchen ESRD (end stage renal disease) on dialysis [facility])    TTS: [address] [place] (05/15/2013)  . ESRD (end stage renal disease) on dialysis ([place])   . Headache(784.0)    "get it from going to dialysis; when they get real bad I come to hospital" (05/15/2013)  . Headache(784.0)   . Hematemesis/vomiting blood   . History of blood transfusion 10/2011; 04/2013  . HTN (hypertension) 06/12/2012  . Renal insufficiency   . Seizures ([place])   . Shortness of breath    "just when I have too much potassium is too high" (05/15/2013)   Past Surgical History:  Procedure Laterality Date  . AV FISTULA PLACEMENT  11/29/2011   Procedure: ARTERIOVENOUS (AV) FISTULA CREATION;  Surgeon: Angelia Mould, MD;  [facility] OR;  Service: Vascular;  Laterality: Left;  . ESOPHAGOGASTRODUODENOSCOPY N/A 05/17/2013   Procedure: ESOPHAGOGASTRODUODENOSCOPY (EGD);  Surgeon: Beryle Beams, MD;  Location: [facility] ENDOSCOPY;  Service: Endoscopy;  Laterality: N/A;  . INGUINAL HERNIA REPAIR Bilateral 1967  . RENAL BIOPSY  11/07/2011   Family History  Problem Relation Age of Onset  . Heart attack Father    Social History:  reports that he has been smoking Cigars and Cigarettes.  He has a 35.00 pack-year smoking history. He has never used smokeless tobacco. He reports that he uses drugs, including Marijuana and Cocaine. He reports that he does not drink alcohol. No Known Allergies Prior to Admission medications   Medication  Sig Start Date End Date Taking? Authorizing Provider  acetaminophen (TYLENOL) 500 MG tablet Take 500-1,000 mg by mouth 6 (six) times daily. 6 times a day, 500 mg, 12 to 24 tabs a day, 6,000 to 12,000 mg daily for pain   Yes Historical Provider, MD  amLODipine (NORVASC) 10 MG tablet Take 1 tablet (10 mg total) by mouth daily. 01/25/14  Yes Nino Glow McLean-Scocuzza, MD  aspirin 81 MG chewable tablet Chew 1 tablet (81 mg total) by  mouth daily. 01/25/14  Yes Nino Glow McLean-Scocuzza, MD  carvedilol (COREG) 25 MG tablet Take 1.5 tablets (37.5 mg total) by mouth 2 (two) times daily with a meal. On Monday-Wednesday-Friday-Sunday (non-HD days); on dialysis days (Tuesday-Thursday-Saturday) take it once a day (evening time only). Patient taking differently: Take 25 mg by mouth 2 (two) times daily with a meal.  01/30/14  Yes Mihai Croitoru, MD  cinacalcet (SENSIPAR) 60 MG tablet Take 60 mg by mouth daily.    Yes Historical Provider, MD  cloNIDine (CATAPRES) 0.3 MG tablet Take 0.3 mg by mouth 3 (three) times daily.    Yes Historical Provider, MD  hydrALAZINE (APRESOLINE) 25 MG tablet Take 25 mg by mouth 3 (three) times daily. 01/31/16  Yes Historical Provider, MD  hydrOXYzine (ATARAX/VISTARIL) 25 MG tablet Take 25 mg by mouth 3 (three) times daily as needed for itching.  06/10/15  Yes Historical Provider, MD  levETIRAcetam (KEPPRA) 1000 MG tablet Take 1 tablet (1,000 mg total) by mouth 2 (two) times daily. 03/07/16  Yes Marcial Pacas, MD  losartan (COZAAR) 100 MG tablet Take 100 mg by mouth at bedtime. 08/02/16  Yes Historical Provider, MD  multivitamin (RENA-VIT) TABS tablet Take 1 tablet by mouth at bedtime. 11/29/11  Yes Alric Seton, PA-C  pantoprazole (PROTONIX) 20 MG tablet Take 20 mg by mouth daily. 02/21/16  Yes Historical Provider, MD  ranitidine (ZANTAC) 300 MG tablet Take 300 mg by mouth daily as needed for heartburn.   Yes Historical Provider, MD  sevelamer carbonate (RENVELA) 800 MG tablet Take 2 tablets (1,600 mg total) by mouth 3 (three) times daily with meals. Patient taking differently: Take 2,400 mg by mouth 3 (three) times daily with meals. And with snacks 01/25/14  Yes Nino Glow McLean-Scocuzza, MD  Nutritional Supplements (FEEDING SUPPLEMENT, NEPRO CARB STEADY,) LIQD Take 237 mLs by mouth 3 (three) times daily as needed (Supplement). Patient not taking: Reported on 02/23/2016 05/17/13   Velta Addison Mikhail, DO   No current  facility-administered medications for this encounter.    Current Outpatient Prescriptions  Medication Sig Dispense Refill  . acetaminophen (TYLENOL) 500 MG tablet Take 500-1,000 mg by mouth 6 (six) times daily. 6 times a day, 500 mg, 12 to 24 tabs a day, 6,000 to 12,000 mg daily for pain    . amLODipine (NORVASC) 10 MG tablet Take 1 tablet (10 mg total) by mouth daily. 30 tablet 1  . aspirin 81 MG chewable tablet Chew 1 tablet (81 mg total) by mouth daily. 30 tablet 11  . carvedilol (COREG) 25 MG tablet Take 1.5 tablets (37.5 mg total) by mouth 2 (two) times daily with a meal. On Monday-Wednesday-Friday-Sunday (non-HD days); on dialysis days (Tuesday-Thursday-Saturday) take it once a day (evening time only). (Patient taking differently: Take 25 mg by mouth 2 (two) times daily with a meal. ) 90 tablet 2  . cinacalcet (SENSIPAR) 60 MG tablet Take 60 mg by mouth daily.     . cloNIDine (CATAPRES) 0.3 MG tablet Take 0.3 mg by mouth  3 (three) times daily.     . hydrALAZINE (APRESOLINE) 25 MG tablet Take 25 mg by mouth 3 (three) times daily.    . hydrOXYzine (ATARAX/VISTARIL) 25 MG tablet Take 25 mg by mouth 3 (three) times daily as needed for itching.   6  . levETIRAcetam (KEPPRA) 1000 MG tablet Take 1 tablet (1,000 mg total) by mouth 2 (two) times daily. 60 tablet 0  . losartan (COZAAR) 100 MG tablet Take 100 mg by mouth at bedtime.    . multivitamin (RENA-VIT) TABS tablet Take 1 tablet by mouth at bedtime.    . pantoprazole (PROTONIX) 20 MG tablet Take 20 mg by mouth daily.    . ranitidine (ZANTAC) 300 MG tablet Take 300 mg by mouth daily as needed for heartburn.    . sevelamer carbonate (RENVELA) 800 MG tablet Take 2 tablets (1,600 mg total) by mouth 3 (three) times daily with meals. (Patient taking differently: Take 2,400 mg by mouth 3 (three) times daily with meals. And with snacks) 90 tablet 0  . Nutritional Supplements (FEEDING SUPPLEMENT, NEPRO CARB STEADY,) LIQD Take 237 mLs by mouth 3 (three)  times daily as needed (Supplement). (Patient not taking: Reported on 02/23/2016)  0    ROS: As per HPI otherwise negative.  Physical Exam: Vitals:   08/02/16 1245 08/02/16 1250 08/02/16 1300 08/02/16 1330  BP: (!) 160/112 (!) 163/106 (!) 171/123 (!) 162/119  Pulse: 88 94 94 99  Resp: (!) 29 (!) 30 (!) 30 (!) 26  Temp: 98.5 F (36.9 C)     TempSrc: Oral     SpO2: 97%  96%   Weight: 76.7 kg (169 lb 1.5 oz)     Height:         General: Ill appearing WM with NRB mask  Head: NCAT sclera not icteric MMM Neck: Supple. No JVD  Lungs:  Rales at bases bilat diffuse bilat coarse rales/ rhonchi Heart: RRR with S1 S2 Abdomen: soft NT ND Lower extremities: 2+ LE edema  no open wounds  Neuro: A & O  X 3. Moves all extremities spontaneously. Psych:  Responds to questions appropriately with a normal affect. Dialysis Access: LUE AVF +thrill   Labs: Basic Metabolic Panel:  Recent Labs Lab 08/02/16 0948  NA 134*  K 6.8*  CL 91*  CO2 24  GLUCOSE 105*  BUN 74*  CREATININE 11.50*  CALCIUM 9.0   Liver Function Tests:  Recent Labs Lab 08/02/16 0948  AST 19  ALT 19  ALKPHOS 121  BILITOT 0.6  PROT 6.7  ALBUMIN 3.7   No results for input(s): LIPASE, AMYLASE in the last 168 hours. No results for input(s): AMMONIA in the last 168 hours. CBC:  Recent Labs Lab 08/02/16 0948  WBC 10.0  HGB 8.9*  HCT 28.8*  MCV 94.4  PLT 154   Cardiac Enzymes: No results for input(s): CKTOTAL, CKMB, CKMBINDEX, TROPONINI in the last 168 hours. CBG: No results for input(s): GLUCAP in the last 168 hours. Iron Studies: No results for input(s): IRON, TIBC, TRANSFERRIN, FERRITIN in the last 72 hours. Studies/Results: Dg Chest 2 View  Result Date: 08/02/2016 CLINICAL DATA:  Shortness of breath for 2 days. Congestive heart failure. End-stage renal disease. EXAM: CHEST  2 VIEW COMPARISON:  02/24/2016 FINDINGS: Stable mild cardiomegaly. Worsening heterogeneous bilateral airspace disease is seen.  There is also multiple small bilateral pulmonary nodular densities which appear increased in size. No evidence of pneumothorax or pleural effusion IMPRESSION: Worsening diffuse bilateral airspace disease with multiple  small pulmonary nodular densities. Differential diagnosis includes pulmonary edema, infection, and possibly metastatic disease. Recommend continued chest radiographic followup or chest CT. Electronically Signed   By: Earle Gell M.D.   On: 08/02/2016 10:29    Dialysis Orders:  [facility] MWF 4h18mins 180 F BFR 500 2/2 EDW 69.5 kg -L AVF No heparin -Hectorol 4 mcg IV q HD -Venofer 50mg  IV q HD  -Mircera 200 mcg IV q 2 weeks   Assessment/Plan: 1. Pulmonary edema/hemoptysis/volume overload - secondary to non adherence to dialysis rx refuses to run longer than 3 hours  2. Hyperkalemia - should resolve with emergent HD today  3. ESRD -  MWF - HD today and again tomorrow on schedule  4. Hypertension/volume  - BP elevated with volume/ 7 kg over EDW by weights here - UF goal 4L today and again tomorrow  5. Anemia  - 8.9 redose esa 3/21  6. Metabolic bone disease -  Cont VDRA/Renvela binder/Sensipar  7. Nutrition - Renal diet/vitamins 8. H/o chronic cocaine use   Lynnda Child PA-C [place] Pager [phone] 08/02/2016, 1:54 PM   Pt seen, examined and agree w A/P as above. ESRD pt with vol overload pulm edema, signs off early on HD.  Getting HD now, breathing somewhat improving.  Plan HD again tomorrow.   Kelly Splinter MD [employer] pager [phone]   08/02/2016, 3:09 PM

## 2016-08-02 NOTE — Significant Event (Signed)
Rapid Response Event Note  Overview:Called by bedside RN d/t tachypnea and increased work of breathing Time Called: 2102 Arrival Time: 2105 Event Type: Respiratory  Initial Focused Assessment:Pt alert and oriented, sitting up in bed.  Skin warm and dry.  Pt RR-30s, sats 93 % on 100% NRB.  Pt using accessory muscles to breath.  Pt has congested cough and says he is still coughing up blood.  Lungs with crackles t/o all lung fields.  Pt had HD yesterday taking off 3L and an emergent treatment today taking of 4L.    Interventions: ABG obtained.  PH-7.47, CO2-44, PO2-73, bicarb-32.    Plan of Care (if not transferred): Monitor pt. Dr. Reesa Chew on way to see patient.  Event Summary: Name of Physician Notified: Dr. Reesa Chew at 2145    at    Outcome: Stayed in room and stabalized  Event End Time: 2145  Jeremy Mccoy

## 2016-08-02 NOTE — H&P (Signed)
Date: 08/02/2016               Patient Name:  Jeremy Mccoy MRN: [id number]  DOB: March 12, 1966 Age / Sex: 51 y.o., male   PCP: Jeremy Arnt, MD         Medical Service: Internal Medicine Teaching Service         Attending Physician: Dr. Truman Hayward, MD    First Contact: Jeremy Mccoy Pager: [phone]  Second Contact: Jeremy Mccoy Pager: [phone]       After Hours (After 5p/  First Contact Pager: [phone]  weekends / holidays): Second Contact Pager: [phone]   Chief Complaint: hemoptysis and volume overload  History of Present Illness:  Jeremy Mccoy is a 51 year old male who presents for evaluation of hemoptysis, shortness of breath, chest tightness and productive cough since yesterday after . He has extensive medical history significant for crack cocaine abuse, ESRD on HD via LUE fistula MWF, diastolic CHF (EF 41-28% in 2015), seizure disorder, COPD and HTN. He reports spitting up "about 6 ounces" of blood last night. Feels this is likely due to him using crack cocaine yesterday because he wasn't feeling well. Notes he has missed some HD sessions and have left some early due to HA and has subsequently been feeling bad for a few weeks. 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   In the ED, temperature was 98.4*, pulse 95, BP 160/112, respirations 30 and he was saturating 96% via non-rebreather mask. CXR demonstrated diffuse bilateral airspace disease with multiple small pulmonary nodular densities with a broad differential of pulmonary edema, infection vs possible metastatic disease. CMET with hyperkalemia at 6.8 and an anion gap of 19 with a normal bicarb. CBC showing stable normocytic anemia at 8.9. BNP 3,700. Nephrology was consulted who performed emergent dialysis and he was subsequently admitted to IMTS for evaluation.   Meds:  Current Meds    Medication Sig  . acetaminophen (TYLENOL) 500 MG tablet Take 500-1,000 mg by mouth 6 (six) times daily. 6 times a day, 500 mg, 12 to 24 tabs a day, 6,000 to 12,000 mg daily for pain  . amLODipine (NORVASC) 10 MG tablet Take 1 tablet (10 mg total) by mouth daily.  Marland Kitchen aspirin 81 MG chewable tablet Chew 1 tablet (81 mg total) by mouth daily.  . carvedilol (COREG) 25 MG tablet Take 1.5 tablets (37.5 mg total) by mouth 2 (two) times daily with a meal. On Monday-Wednesday-Friday-Sunday (non-HD days); on dialysis days (Tuesday-Thursday-Saturday) take it once a day (evening time only). (Patient taking differently: Take 25 mg by mouth 2 (two) times daily with a meal. )  . cinacalcet (SENSIPAR) 60 MG tablet Take 60 mg by mouth daily.   . cloNIDine (CATAPRES) 0.3 MG tablet Take 0.3 mg by mouth 3 (three) times daily.   . hydrALAZINE (APRESOLINE) 25 MG tablet Take 25 mg by mouth 3 (three) times daily.  . hydrOXYzine (ATARAX/VISTARIL) 25 MG tablet Take 25 mg by mouth 3 (three) times daily as needed for itching.   . levETIRAcetam (KEPPRA) 1000 MG tablet Take 1 tablet (1,000 mg total) by mouth 2 (two) times daily.  Marland Kitchen losartan (COZAAR) 100 MG tablet Take 100 mg by mouth at bedtime.  . multivitamin (RENA-VIT) TABS tablet Take 1 tablet by mouth at bedtime.  . pantoprazole (PROTONIX) 20 MG tablet Take  20 mg by mouth daily.  . ranitidine (ZANTAC) 300 MG tablet Take 300 mg by mouth daily as needed for heartburn.  . sevelamer carbonate (RENVELA) 800 MG tablet Take 2 tablets (1,600 mg total) by mouth 3 (three) times daily with meals. (Patient taking differently: Take 2,400 mg by mouth 3 (three) times daily with meals. And with snacks)   Allergies: Allergies as of 08/02/2016  . (No Known Allergies)   Past Medical History:  Diagnosis Date  . Anemia   . Anxiety   . Arthritis    "qwhere" (05/15/2013)  . CHF (congestive heart failure) ([place])   . COPD (chronic obstructive pulmonary disease) ([place])   . Crack cocaine use    . Dental caries   . Depression   . End stage renal disease ([place]) 01/11/2012   Patient presented 11/04/11 to hospital with CP, wt loss and N/V. Creat was 10, admitted. 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 ANA was + 1:80, +MPO and +pr3 Ab's (ANCA). Received plasmapheresis x 7, IV cytoxan and pred taper. First HD was 11/17/11. 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 Pt d/c'd to do outpt HD and may have gotten 1-2 additional Cytoxan as OP, but this was stopped eventually since renal function didn't recover. Now gets HD TTS schedule at [facility]  L forearm AVF 11/19/11 by Dr. Scot Mccoy is current access.    Marland Kitchen ESRD (end stage renal disease) on dialysis [facility])    TTS: [address] [place] (05/15/2013)  . ESRD (end stage renal disease) on dialysis ([place])   . Headache(784.0)    "get it from going to dialysis; when they get real bad I come to hospital" (05/15/2013)  . Headache(784.0)   . Hematemesis/vomiting blood   . History of blood transfusion 10/2011; 04/2013  . HTN (hypertension) 06/12/2012  . Renal insufficiency   . Seizures ([place])   . Shortness of breath    "just when I have too much potassium is too high" (05/15/2013)  Family History: Father: deceased, [place] Social History: Current daily smoker with a 35 pack-year history. Denied recent alcohol abuse however does endorse crack cocaine use (smokes it) and marijuana use.  Review of Systems: A complete ROS was negative except as per HPI.  Physical Exam: Blood pressure (!) 171/123, pulse 94, temperature 98.5 F (36.9 C), temperature source Oral, resp. rate (!) 30, height 6' (1.829 m), weight 169 lb 1.5 oz (76.7 kg), SpO2 96 %. General: Thin, chronically-ill appearing caucasian male resting in HD. In no acute distress. Still had non-rebreather on but took off during conversation without desat.  HENT: [place]/AT. EOMI. No  conjunctival injection, icterus or ptosis.  Cardiovascular: Regular rate and rhythm. No murmur or rub appreciated however difficult to appreciate given patients pulmonary sounds.  Pulmonary: Diffuse coarse rales bilaterally with rhonchi. Unlabored breathing.  Abdomen: Soft, thin, tender throughout. No guarding or rigidity. +bowel sounds.  Extremities: 2+ lower extremity edema. Scattered scabs on hands and arms. No gross deformities. LUE AVF + thrill Neuro: Alert and oriented x3. Strength and sensation grossly intact.  Psych: Mood normal and affect was mood congruent. Responds to questions appropriately.   EKG: Sinus rhythm, rate 85. LAD. Narrow complex. Some tall T-waves however not peaked.   CXR: Diffuse bilateral airspace disease with multiple small pulmonary nodular densities. Wide differential including pulmonary edema vs infection vs possibly even metastatic disease. Follow-up imaging recommended.  Assessment & Plan by Problem: Principal Problem:   ESRD on hemodialysis ([place]) Active Problems:   Cocaine abuse   Anemia in chronic kidney disease   Acute respiratory failure (H[facility])   COPD (chronic obstructive pulmonary disease) (H[facility])   Hemoptysis  Acute Respiratory Failure, Hemoptysis: Pt requiring supplemental O2 to maintain saturation in setting of volume overload due to HD non-compliance. Improved with non-rebreather. 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 Endorses history of similar episodes this time last year as well as about 5 years ago. Hb stable currently at 8.9 in this patient with anemia of chronic disease. He denies any hematochezia, melena, hematemesis or other sources of blood loss. He has been counseled before on the importance of crack cocaine abstinence, especially in the setting of recurrent episodes of hemoptysis with use however pt reports he used it because he was feeling  poorly due to missing dialysis. Again received counseling on importance of cessation.   -Hb and VSS stable currently however type & screen and will order STAT CBC in the event of another episode of hemoptysis -Repeat CBC in AM -Due for Aranesp tomorrow with HD -Telemetry, Pulse ox  Hyperkalemia, ESRD on HD, Volume Overload secondary to non-compliance: Supposed to receive HD MWF: K 6.8 on admission and EKG without concrete changes of hyperkalemia. He was taken to HD urgently today. Renal failure thought to be secondary to levemisole-induced vasculitis. Started HD 11/2011. Presenting weight 169 lbs, dry weight thought to be ~ 155 lbs. He admits to missing several HD sessions and terminating the remainder early due to HA or not feeling well.  -HD per nephrology. Will dialyze again tomorrow.  -Electrolytes per nephro -Repeat chemistry in AM  Polysubstance abuse including Crack Cocaine and Marijuana: These appear to be longstanding issues in this patient and has undergone multiple rehab/detox programs.  -HIV screen given his high-risk behavior  HTN: Hypertensive on admission however in the setting of volume overload. Currently in HD and expect some improvement with continued HD sessions. Pt reports he has not taken hydralazine recently per EMR so this has been held on admission. Will restart if persistently hypertensive, otherwise will continue his home regimen. -Amlodipine 10 mg -Coreg 25 mg BID -Clonidine 0.3 mg TID -Losartan 100 mg QHS -Add Hydral if patient persistently hypertensive after HD  COPD: In the setting of a 35 pack year history with currently 1pk/day use. Also in setting of smoking crack cocaine. Does not use inhalers at home.   Consults: Nephrology IVF: NONE Diet: Renal with fluid restriction <1264mL Code status: FULL CODE DVT Prophylaxis: SCDs in setting of hemoptysis  Dispo: Admit patient to Inpatient with expected length of stay greater than 2 midnights.  SignedEinar Gip, DO 08/02/2016, 1:33 PM  Pager: [phone]    Date: 08/03/2016  Patient name: Jeremy Mccoy  Medical record number: [id number]  Date of birth: 1966/04/20   I have seen and evaluated Jeremy Mccoy and discussed their care with the Residency Team. This is a 51 year old Caucasian man with a history of extensive crack cocaine abuse who has developed end-stage disease and is on hemodialysis. He developed ESRD from levimasole toxicity (agent used in cocaine in the [place]). He has had this and in the past with hemoptysis in the setting of recurrent cocaine abuse and also missing hemodialysis or having incomplete hemodialysis sessions. In fact we took care of him nearly a year ago in March 2017  on the teaching service as well. Yesterday he presented with hemoptysis and shortness of breath and chest tightness. He had mentioned spitting up 6 ounces of blood. He stated this happened after he had used crack cocaine while he was not feeling well. He also had missed several hemodialysis sessions and left early due to having a headache. He was admitted to our service last night and was found to have bilateral opacities concerning for possible diffuse pulmonary edema. After hemodialysis he still was suffering from hypoxemic respiratory failure and underwent placement on BiPAP he underwent a second hemodialysis session this morning. He is now on a nonrebreather and states that he feels really better than he did last night.  He is being followed closely by nephrology but also pulmonary care. I emphasized again to him the toxic nature of levimasole which ruined his kidneys and was likely also damaging his lungs and emphasized that he needed to stop using cocaine period.  PMHx, Fam Hx, and/or Soc Hx : Reviewed and as above  Vitals:   08/03/16 1505 08/03/16 1600  BP: (!) 141/90 132/84  Pulse:  87  Resp:    Temp:  97.8 F (36.6 C)   Gen.: Thin man who is older. In that his stated age she is oriented to  person place location. Is wearing a nonrebreather  HEENT: He has some muscle wasting in his face  Cardiovascular exam: Tachycardic no murmurs gallops or rubs heard  Pulmonary exam: Diffuse crackles in the bases, he has difficulty drawing a full breath. GI soft nondistended  Extremities without edema  Neurological exam nonfocal  Skin no new rashes.  Assessment and Plan: I have seen and evaluated the patient as outlined above. I agree with the formulated Assessment and Plan as detailed in the residents' note, with the following changes:   Hypoxic respiratory failure due to combination of volume overload from missing HD and likely pulmonary toxicity of cocaine (with levimasole)  Greatly appreciate pulmonary critical care and nephrology's help. He seems to have improved after second session of hemodialysis. We will see what nephrology thinks in terms of needs for another session today but he will undoubtedly have another one tomorrow. Watch him closely.  I do not think his bilateral infiltrates are consistent with infection but are due to volume overload plus live levimasole cocaine + toxicity to lung   Levimasole + vasculitis: Again we have emphasized that he absolutely must stop using cocaine if he is to stop going to the cycles he is currently and he may have other even more morbid consequences to exposing himself to this drug which is particular toxic to him.  Agree with rescreening him for hep C hep B and HIV.   Jeremy Mccoy, [place] 3/21/20186:11 PM

## 2016-08-02 NOTE — ED Notes (Signed)
Renal diet ordered for patient.

## 2016-08-02 NOTE — ED Provider Notes (Signed)
[facility] DEPT Provider Note   CSN: [id number] Arrival date & time: 08/02/16  2703     History   Chief Complaint Chief Complaint  Patient presents with  . Shortness of Breath  . Hematemesis    HPI Jeremy Mccoy is a 51 y.o. male with pertinent pmh of tobacco abuse, crack cocaine abuse (smokes it), ESRD on HD via LUE fistula (MWF), seizure disorder, HTN, COPD, chronic diastolic HF (EF 50-09% in 2015) presents to ED reporting shortness of breath, chest tightness, wet cough and hemoptysis since yesterday.  Patient reports increased weight by 9 kg over the weekend.  Patient did not feel very well yesterday during dialysis and was not able to stay for the whole scheduled time, is rescheduled to return to HD today but his breathing deteriorated and he came to ED instead.  Patient smokes 1ppd and uses cocaine almost daily, last time used last night. Patient also reports nasal congestion, runny nose and myalgias x 3-4 days.  Per chart review, patient presented to ED a year ago with similar shortness of breath and hemoptysis.  At that time patient had missed a day of dialysis.  Patient was found to be hyperkalemic.  Patient was admitted, received emergent HD and hemoptysis was thought to have been precipitated by crack cocaine use possibly vasculitis (resolved after HD).   HPI  Past Medical History:  Diagnosis Date  . Anemia   . Anxiety   . Arthritis    "qwhere" (05/15/2013)  . CHF (congestive heart failure) ([place])   . COPD (chronic obstructive pulmonary disease) ([place])   . Crack cocaine use   . Dental caries   . Depression   . End stage renal disease ([place]) 01/11/2012   Patient presented 11/04/11 to hospital with CP, wt loss and N/V. Creat was 10, admitted. 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 ANA was + 1:80, +MPO and +pr3 Ab's (ANCA). Received plasmapheresis x 7, IV cytoxan and pred taper. First HD was 11/17/11. 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 Pt d/c'd to do outpt HD and may have gotten 1-2 additional Cytoxan as OP, but this was stopped eventually since renal function didn't recover. Now gets HD TTS schedule at [facility].  L forearm AVF 11/19/11 by Dr. Scot Dock is current access.    Marland Kitchen ESRD (end stage renal disease) on dialysis [facility])    TTS: [address] [place] (05/15/2013)  . ESRD (end stage renal disease) on dialysis ([place])   . Headache(784.0)    "get it from going to dialysis; when they get real bad I come to hospital" (05/15/2013)  . Headache(784.0)   . Hematemesis/vomiting blood   . History of blood transfusion 10/2011; 04/2013  . HTN (hypertension) 06/12/2012  . Renal insufficiency   . Seizures ([place])   . Shortness of breath    "just when I have too much potassium is too high" (05/15/2013)    Patient Active Problem List   Diagnosis Date Noted  . Hyperkalemia 02/23/2016  . Coughing blood   . Vasculitis ([place])   . Lung crackles   . Hemoptysis 07/25/2015  . Anemia 02/20/2014  . Symptomatic anemia 02/20/2014  . PAF (paroxysmal atrial fibrillation) ([place]) 01/31/2014  . Chronic diastolic congestive heart failure ([place]) 01/31/2014  . COPD (chronic obstructive pulmonary disease) ([place]) 01/31/2014  . Thrombocytopenia ([place]) 01/23/2014  . Unspecified constipation 01/23/2014  . High anion  gap metabolic acidosis 17/79/3903  . ESRD on hemodialysis ([place]) 11/05/2013  . Seizure disorder ([place]) 11/04/2013  . Protein-calorie malnutrition, severe ([place]) 07/12/2013  . Acute respiratory failure ([place]) 07/10/2013  . Status epilepticus ([place]) 07/10/2013  . Essential hypertension 07/10/2013  . Acute blood loss anemia 05/15/2013  . Hematemesis 05/15/2013  . Volume excess 09/20/2012  . Abnormal EKG 09/20/2012  . Drug abuse 09/20/2012  . Elevated troponin 09/20/2012  . Anemia in chronic kidney disease 09/20/2012  . Cocaine abuse 06/12/2012  . End  stage renal disease ([place]) 01/11/2012  . Tobacco abuse 11/04/2011  . Dental caries 11/04/2011  . Color blindness 11/04/2011    Past Surgical History:  Procedure Laterality Date  . AV FISTULA PLACEMENT  11/29/2011   Procedure: ARTERIOVENOUS (AV) FISTULA CREATION;  Surgeon: Angelia Mould, MD;  Location: [facility] OR;  Service: Vascular;  Laterality: Left;  . ESOPHAGOGASTRODUODENOSCOPY N/A 05/17/2013   Procedure: ESOPHAGOGASTRODUODENOSCOPY (EGD);  Surgeon: Beryle Beams, MD;  Location: [facility] ENDOSCOPY;  Service: Endoscopy;  Laterality: N/A;  . INGUINAL HERNIA REPAIR Bilateral 1967  . RENAL BIOPSY  11/07/2011       Home Medications    Prior to Admission medications   Medication Sig Start Date End Date Taking? Authorizing Provider  acetaminophen (TYLENOL) 500 MG tablet Take 500-1,000 mg by mouth 6 (six) times daily. 6 times a day, 500 mg, 12 to 24 tabs a day, 6,000 to 12,000 mg daily for pain   Yes Historical Provider, MD  amLODipine (NORVASC) 10 MG tablet Take 1 tablet (10 mg total) by mouth daily. 01/25/14  Yes Nino Glow McLean-Scocuzza, MD  aspirin 81 MG chewable tablet Chew 1 tablet (81 mg total) by mouth daily. 01/25/14  Yes Nino Glow McLean-Scocuzza, MD  carvedilol (COREG) 25 MG tablet Take 1.5 tablets (37.5 mg total) by mouth 2 (two) times daily with a meal. On Monday-Wednesday-Friday-Sunday (non-HD days); on dialysis days (Tuesday-Thursday-Saturday) take it once a day (evening time only). Patient taking differently: Take 25 mg by mouth 2 (two) times daily with a meal.  01/30/14  Yes Mihai Croitoru, MD  cinacalcet (SENSIPAR) 60 MG tablet Take 60 mg by mouth daily.    Yes Historical Provider, MD  cloNIDine (CATAPRES) 0.3 MG tablet Take 0.3 mg by mouth 3 (three) times daily.    Yes Historical Provider, MD  hydrALAZINE (APRESOLINE) 25 MG tablet Take 25 mg by mouth 3 (three) times daily. 01/31/16  Yes Historical Provider, MD  hydrOXYzine (ATARAX/VISTARIL) 25 MG tablet Take 25 mg by mouth 3 (three)  times daily as needed for itching.  06/10/15  Yes Historical Provider, MD  levETIRAcetam (KEPPRA) 1000 MG tablet Take 1 tablet (1,000 mg total) by mouth 2 (two) times daily. 03/07/16  Yes Marcial Pacas, MD  losartan (COZAAR) 100 MG tablet Take 100 mg by mouth at bedtime. 08/02/16  Yes Historical Provider, MD  multivitamin (RENA-VIT) TABS tablet Take 1 tablet by mouth at bedtime. 11/29/11  Yes Alric Seton, PA-C  pantoprazole (PROTONIX) 20 MG tablet Take 20 mg by mouth daily. 02/21/16  Yes Historical Provider, MD  ranitidine (ZANTAC) 300 MG tablet Take 300 mg by mouth daily as needed for heartburn.   Yes Historical Provider, MD  sevelamer carbonate (RENVELA) 800 MG tablet Take 2 tablets (1,600 mg total) by mouth 3 (three) times daily with meals. Patient taking differently: Take 2,400 mg by mouth 3 (three) times daily with meals. And with snacks 01/25/14  Yes Nino Glow McLean-Scocuzza, MD  Nutritional Supplements (FEEDING SUPPLEMENT, NEPRO CARB  STEADY,) LIQD Take 237 mLs by mouth 3 (three) times daily as needed (Supplement). Patient not taking: Reported on 02/23/2016 05/17/13   Cristal Ford, DO    Family History Family History  Problem Relation Age of Onset  . Heart attack Father     Social History Social History  Substance Use Topics  . Smoking status: Current Every Day Smoker    Packs/day: 1.00    Years: 35.00    Types: Cigars, Cigarettes  . Smokeless tobacco: Never Used  . Alcohol use No     Comment: 05/15/2013 "aien't drank in ~ 25 yrs"     Allergies   Patient has no known allergies.   Review of Systems Review of Systems  Constitutional: Negative for chills, diaphoresis and fever.  HENT: Positive for congestion, postnasal drip and rhinorrhea. Negative for sore throat.   Eyes: Negative for visual disturbance.  Respiratory: Positive for cough, chest tightness and shortness of breath.   Cardiovascular: Positive for chest pain and leg swelling (chronic). Negative for palpitations.    Gastrointestinal: Negative for abdominal pain, blood in stool, constipation, diarrhea, nausea and vomiting.  Genitourinary: Negative for flank pain and hematuria.  Musculoskeletal: Positive for myalgias. Negative for joint swelling.  Skin: Negative for rash.  Allergic/Immunologic: Positive for immunocompromised state.  Neurological: Negative for dizziness, syncope, weakness, light-headedness and headaches.  Hematological: Negative.   Psychiatric/Behavioral: Negative.      Physical Exam Updated Vital Signs BP (!) 159/110   Pulse 93   Temp 98.4 F (36.9 C) (Oral)   Resp (!) 31   Ht 6' (1.829 m)   Wt 76.7 kg   SpO2 92%   BMI 22.93 kg/m   Physical Exam  Constitutional: He is oriented to person, place, and time. He appears well-developed and well-nourished. No distress.  Chronically ill appearing  HENT:  Head: Normocephalic and atraumatic.  Nose: Mucosal edema and rhinorrhea present.  Mouth/Throat: Uvula is midline, oropharynx is clear and moist and mucous membranes are normal. No trismus in the jaw. No uvula swelling. No oropharyngeal exudate. Tonsils are 1+ on the right. Tonsils are 1+ on the left.  Eyes: Conjunctivae, EOM and lids are normal. Pupils are equal, round, and reactive to light.  Neck: Normal range of motion and full passive range of motion without pain. Neck supple. No JVD present. No tracheal deviation present.  Cardiovascular: Normal rate, regular rhythm, S1 normal, S2 normal, normal heart sounds and intact distal pulses.   No murmur heard. Pulses:      Carotid pulses are 2+ on the right side, and 2+ on the left side.      Radial pulses are 2+ on the right side, and 2+ on the left side.       Dorsalis pedis pulses are 2+ on the right side, and 2+ on the left side.  Bilateral lower extremity edema (R>L), 2+ and symmetric radial, dorsalis pedis and posterior tibial pulses bilaterally Patient reported worsening SOB with head of the bed flat.  +Crackles in bilateral  lower lobes L>R  BP 143/98. HR 86.  No JVD with head of bed at 30 degrees. Carotid pulses 2+ bilaterally without bruits. No lateral displacement of apical pulse.  Apical pulse without thrills or lifts.  Good S1 and S2. No extra sounds or murmurs.  Pulmonary/Chest: Effort normal. Tachypnea noted. No respiratory distress. He has no decreased breath sounds. He has no wheezes. He has rhonchi in the right middle field, the right lower field, the left middle field and  the left lower field. He has rales in the right middle field, the right lower field, the left middle field and the left lower field.  +Crackles in bilateral lower lobes L>R   Abdominal: Soft. Bowel sounds are normal. There is no tenderness.  Questionable abdominal distention   Musculoskeletal: Normal range of motion. He exhibits no deformity.       Right ankle: He exhibits ecchymosis. Tenderness. Lateral malleolus tenderness found.       Feet:  Lymphadenopathy:    He has no cervical adenopathy.  Neurological: He is alert and oriented to person, place, and time.  Skin: Skin is warm and dry. Capillary refill takes less than 2 seconds. Ecchymosis noted.  Ecchymosis to right lateral malleolus consistent with recent injury  Psychiatric: He has a normal mood and affect. His behavior is normal. Judgment and thought content normal.  Nursing note and vitals reviewed.    ED Treatments / Results  Labs (all labs ordered are listed, but only abnormal results are displayed) Labs Reviewed  BASIC METABOLIC PANEL - Abnormal; Notable for the following:       Result Value   Sodium 134 (*)    Potassium 6.8 (*)    Chloride 91 (*)    Glucose, Bld 105 (*)    BUN 74 (*)    Creatinine, Ser 11.50 (*)    GFR calc non Af Amer 4 (*)    GFR calc Af Amer 5 (*)    Anion gap 19 (*)    All other components within normal limits  CBC - Abnormal; Notable for the following:    RBC 3.05 (*)    Hemoglobin 8.9 (*)    HCT 28.8 (*)    RDW 16.2 (*)    All  other components within normal limits  BRAIN NATRIURETIC PEPTIDE - Abnormal; Notable for the following:    B Natriuretic Peptide 3,739.8 (*)    All other components within normal limits  PROTIME-INR - Abnormal; Notable for the following:    Prothrombin Time 15.9 (*)    All other components within normal limits  APTT - Abnormal; Notable for the following:    aPTT 41 (*)    All other components within normal limits  HEPATIC FUNCTION PANEL  I-STAT TROPOININ, ED  TYPE AND SCREEN    EKG  EKG Interpretation  Date/Time:  Tuesday August 02 2016 09:43:55 EDT Ventricular Rate:  85 PR Interval:    QRS Duration: 98 QT Interval:  374 QTC Calculation: 445 R Axis:   -49 Text Interpretation:  Sinus rhythm Probable left atrial enlargement Left anterior fascicular block LVH with secondary repolarization abnormality ST-t wave abnormality No significant change since last tracing Abnormal ekg Confirmed by Carmin Muskrat  MD [phone]) on 08/02/2016 9:55:15 AM       Radiology Dg Chest 2 View  Result Date: 08/02/2016 CLINICAL DATA:  Shortness of breath for 2 days. Congestive heart failure. End-stage renal disease. EXAM: CHEST  2 VIEW COMPARISON:  02/24/2016 FINDINGS: Stable mild cardiomegaly. Worsening heterogeneous bilateral airspace disease is seen. There is also multiple small bilateral pulmonary nodular densities which appear increased in size. No evidence of pneumothorax or pleural effusion IMPRESSION: Worsening diffuse bilateral airspace disease with multiple small pulmonary nodular densities. Differential diagnosis includes pulmonary edema, infection, and possibly metastatic disease. Recommend continued chest radiographic followup or chest CT. Electronically Signed   By: Earle Gell M.D.   On: 08/02/2016 10:29    Procedures Procedures (including critical care time)  Medications Ordered  in ED Medications  ipratropium-albuterol (DUONEB) 0.5-2.5 (3) MG/3ML nebulizer solution 3 mL (3 mLs  Nebulization Given 08/02/16 1028)     Initial Impression / Assessment and Plan / ED Course  I have reviewed the triage vital signs and the nursing notes.  Pertinent labs & imaging results that were available during my care of the patient were reviewed by me and considered in my medical decision making (see chart for details).  Clinical Course as of Aug 02 1245  Tue Aug 02, 2016  0956 Sinus rhythm Probable left atrial enlargement Left anterior fascicular block LVH with secondary repolarization abnormality ST-t wave abnormality No significant change since last tracing Abnormal ekg ED EKG within 10 minutes [CG]  0956 Resp: (!) 22 [CG]  0956 On 10L supplemental oxygen SpO2: 96 % [CG]  0957 Temp: 98.4 F (36.9 C) [CG]  0957 Pulse Rate: 86 [CG]  0957 BP: (!) 143/98 [CG]  1045 IMPRESSION: Worsening diffuse bilateral airspace disease with multiple small pulmonary nodular densities. Differential diagnosis includes pulmonary edema, infection, and possibly metastatic disease. Recommend continued chest radiographic followup or chest CT. DG Chest 2 View [CG]  1045 Platelets: 154 [CG]  4982 H/o anemia, this is patient's baseline Hgb Hemoglobin: (!) 8.9 [CG]  1045 WBC: 10.0 [CG]  1046 Prothrombin Time: (!) 15.9 [CG]  1046 APTT: (!) 41 [CG]  1046 Troponin i, poc: 0.01 [CG]  1103 Potassium: (!!) 6.8 [CG]  1103 Chloride: (!) 91 [CG]  1103 BUN: (!) 74 [CG]  1104 Creatinine: (!) 11.50 [CG]  1104 EGFR (Non-African Amer.): (!) 4 [CG]  1104 B Natriuretic Peptide: (!) 3,739.8 [CG]    Clinical Course User Index [CG] Kinnie Feil, PA-C   Pt with h/o tobacco abuse, crack cocaine abuse (smokes it), ESRD on HD via LUE fistula (MWF), seizure disorder, HTN, COPD, chronic diastolic HF (EF 64-15% in 2015) presents to ED reporting shortness of breath, chest tightness, wet cough and hemoptysis since yesterday. Incomplete dialysis session yesterday.  Last cocaine use last night.  Patient has crackles on  lung exam, bilateral LE edema, is tachypnic with elevated SBP.  Patient speaking in full sentences, chronically ill appearing.  Concerned for CHF exacerbation, ACS, vasculitis or need for emergent dialysis.    Lab work remarkable for CXR concerning for new multiple small pulmonary nodular densities suspicious for pulmonary edema (most likely), infection or mets.  CT chest recommended.  BNP 3,739.  EKG unchanged from previous and troponin x 1 normal.  CBC remarkable for anemia Hgb 8.9 which appears to be patient's baseline anemia, no leukocytosis, normal PLTs.  BMP showed hyperkalemia 6.8, creatinine 11.50 and GFR 4.  PT/PTT grossly normal. LFTs within normal limits.  Patient needs emergent dialysis, less optimal ED tx for hyperkalemia and fluid overload. Consulted nephrology who will see patient in ED and place orders for HD.  Patient will require admission after HD. Pending hospitalist consult. Discussed plan to HD with patient who is agreeable.   Patient, ED treatment and discharge plan was discussed with supervising physician who also evaluated the patient and is agreeable with plan.   Final Clinical Impressions(s) / ED Diagnoses   Final diagnoses:  Hemoptysis  Shortness of breath    New Prescriptions New Prescriptions   No medications on file     Arlean Hopping 08/02/16 [place], MD 08/03/16 1540

## 2016-08-02 NOTE — Consult Note (Signed)
PCCM Consult Note  Admission date: 08/02/2016 Consult date: 08/02/2016 Referring provider: Dr. Tommy Medal  CC: Short of breath  HPI: 51 yo male presented to ER with dyspnea.  He reports having cough with blood in sputum.  He was noted to be hypoxic and have b/l infiltrates on CXR.  He has hx of ESRD on HD.  He has hx of cocaine abuse.  He has several previous admissions for similar findings.  He also had chest tightness.  He reports smoking crack cocaine one day prior to admission.  He is not consistent with his HD treatments.  Renal consulted, and he was up for HD.  In spite of fluid removal with HD, he had progressive dyspnea and hypoxia, and PCCM asked to assess.  He was transferred to SDU and started on Bipap >> says he loves it and wishes this was started sooner.  He  has a past medical history of Anemia; Anxiety; Arthritis; CHF (congestive heart failure) ([place]); COPD (chronic obstructive pulmonary disease) ([place]); Crack cocaine use; Dental caries; Depression; End stage renal disease ([place]) (01/11/2012); ESRD (end stage renal disease) on dialysis [facility]); ESRD (end stage renal disease) on dialysis ([place]); Headache(784.0); Headache(784.0); Hematemesis/vomiting blood; History of blood transfusion (10/2011; 04/2013); HTN (hypertension) (06/12/2012); Renal insufficiency; Seizures ([place]); and Shortness of breath.  He  has a past surgical history that includes AV fistula placement (11/29/2011); Renal biopsy (11/07/2011); Inguinal hernia repair (Bilateral, 1967); and Esophagogastroduodenoscopy (N/A, 05/17/2013).  He  reports that he has been smoking Cigars and Cigarettes.  He has a 35.00 pack-year smoking history. He has never used smokeless tobacco. He reports that he uses drugs, including Marijuana and Cocaine. He reports that he does not drink alcohol.  His family history includes Heart attack in his father.  No Known Allergies  No current facility-administered medications on file prior to encounter.    Current  Outpatient Prescriptions on File Prior to Encounter  Medication Sig  . acetaminophen (TYLENOL) 500 MG tablet Take 500-1,000 mg by mouth 6 (six) times daily. 6 times a day, 500 mg, 12 to 24 tabs a day, 6,000 to 12,000 mg daily for pain  . amLODipine (NORVASC) 10 MG tablet Take 1 tablet (10 mg total) by mouth daily.  Marland Kitchen aspirin 81 MG chewable tablet Chew 1 tablet (81 mg total) by mouth daily.  . carvedilol (COREG) 25 MG tablet Take 1.5 tablets (37.5 mg total) by mouth 2 (two) times daily with a meal. On Monday-Wednesday-Friday-Sunday (non-HD days); on dialysis days (Tuesday-Thursday-Saturday) take it once a day (evening time only). (Patient taking differently: Take 25 mg by mouth 2 (two) times daily with a meal. )  . cinacalcet (SENSIPAR) 60 MG tablet Take 60 mg by mouth daily.   . cloNIDine (CATAPRES) 0.3 MG tablet Take 0.3 mg by mouth 3 (three) times daily.   . hydrALAZINE (APRESOLINE) 25 MG tablet Take 25 mg by mouth 3 (three) times daily.  . hydrOXYzine (ATARAX/VISTARIL) 25 MG tablet Take 25 mg by mouth 3 (three) times daily as needed for itching.   . levETIRAcetam (KEPPRA) 1000 MG tablet Take 1 tablet (1,000 mg total) by mouth 2 (two) times daily.  . multivitamin (RENA-VIT) TABS tablet Take 1 tablet by mouth at bedtime.  . pantoprazole (PROTONIX) 20 MG tablet Take 20 mg by mouth daily.  . sevelamer carbonate (RENVELA) 800 MG tablet Take 2 tablets (1,600 mg total) by mouth 3 (three) times daily with meals. (Patient taking differently: Take 2,400 mg by mouth 3 (three) times daily  with meals. And with snacks)  . Nutritional Supplements (FEEDING SUPPLEMENT, NEPRO CARB STEADY,) LIQD Take 237 mLs by mouth 3 (three) times daily as needed (Supplement). (Patient not taking: Reported on 02/23/2016)    ROS: 12 point ROS negative except above.  Vital signs: BP (!) 156/97 (BP Location: Right Arm)   Pulse (!) 104   Temp 98.7 F (37.1 C) (Oral)   Resp (!) 21   Ht 6' (1.829 m)   Wt 159 lb 13.3 oz (72.5  kg)   SpO2 96%   BMI 21.68 kg/m   Intake/output: I/O last 3 completed shifts: In: -  Out: 4000 [Other:4000]  General: sitting up in bed, alert, speaks in full sentences Neuro: normal strength, moves extremities HEENT: Pupils reactive, Bipap mask on, no LAN Cardiac: regular, tachycardic, no murmur Chest: b/l crackles more in upper lung fields Abd: soft, non tender, + bowel sounds Ext: no edema, AV graft Lt arm Skin: no rashes   CMP Latest Ref Rng & Units 08/02/2016 02/26/2016 02/25/2016  Glucose 65 - 99 mg/dL 105(H) 139(H) 92  BUN 6 - 20 mg/dL 74(H) 90(H) 63(H)  Creatinine 0.61 - 1.24 mg/dL 11.50(H) 12.61(H) 10.14(H)  Sodium 135 - 145 mmol/L 134(L) 138 137  Potassium 3.5 - 5.1 mmol/L 6.8(HH) 5.3(H) 5.3(H)  Chloride 101 - 111 mmol/L 91(L) 95(L) 96(L)  CO2 22 - 32 mmol/L 24 24 26   Calcium 8.9 - 10.3 mg/dL 9.0 9.7 9.7  Total Protein 6.5 - 8.1 g/dL 6.7 - -  Total Bilirubin 0.3 - 1.2 mg/dL 0.6 - -  Alkaline Phos 38 - 126 U/L 121 - -  AST 15 - 41 U/L 19 - -  ALT 17 - 63 U/L 19 - -     CBC Latest Ref Rng & Units 08/02/2016 08/02/2016 02/26/2016  WBC 4.0 - 10.5 K/uL 10.5 10.0 6.3  Hemoglobin 13.0 - 17.0 g/dL 8.9(L) 8.9(L) 8.8(L)  Hematocrit 39.0 - 52.0 % 28.9(L) 28.8(L) 27.5(L)  Platelets 150 - 400 K/uL 153 154 188     ABG    Component Value Date/Time   PHART 7.474 (H) 08/02/2016 2114   PCO2ART 44.4 08/02/2016 2114   PO2ART 73.3 (L) 08/02/2016 2114   HCO3 32.2 (H) 08/02/2016 2114   TCO2 19 02/23/2016 1135   ACIDBASEDEF 7.0 (H) 01/21/2014 0734   O2SAT 94.1 08/02/2016 2114     CBG (last 3)  No results for input(s): GLUCAP in the last 72 hours.   Imaging: Dg Chest 2 View  Result Date: 08/02/2016 CLINICAL DATA:  Shortness of breath for 2 days. Congestive heart failure. End-stage renal disease. EXAM: CHEST  2 VIEW COMPARISON:  02/24/2016 FINDINGS: Stable mild cardiomegaly. Worsening heterogeneous bilateral airspace disease is seen. There is also multiple small  bilateral pulmonary nodular densities which appear increased in size. No evidence of pneumothorax or pleural effusion IMPRESSION: Worsening diffuse bilateral airspace disease with multiple small pulmonary nodular densities. Differential diagnosis includes pulmonary edema, infection, and possibly metastatic disease. Recommend continued chest radiographic followup or chest CT. Electronically Signed   By: Earle Gell M.D.   On: 08/02/2016 10:29   Dg Chest Port 1 View  Result Date: 08/02/2016 CLINICAL DATA:  Dyspnea EXAM: PORTABLE CHEST 1 VIEW COMPARISON:  08/02/2016, 02/24/2016 FINDINGS: Interval worsening of bilateral pulmonary airspace opacities with upper lobe predominance. There is cardiomegaly. No pleural effusion. No pneumothorax. IMPRESSION: Interim worsening of diffuse bilateral pulmonary a airspace opacities with upper lobe predominance. Opacities are nodular in appearance. Findings could be secondary to worsening pneumonia,  edema, or pulmonary nodules. CT previously recommended for further evaluation. Electronically Signed   By: Donavan Foil M.D.   On: 08/02/2016 23:01     Studies:  Antibiotics:  Cultures:  Lines/tubes:  Events: 3/20 Admit, renal consulted  Summary: 51 yo male with acute hypoxic respiratory failure and hemoptysis with b/l pulmonary infiltrates in setting of crack cocaine use and hx of ESRD with inconsistent compliance with HD.  Assessment/plan:  Acute hypoxic respiratory failure. - oxygen to keep SpO2 > 92% - Bipap >> hopefully can avoid intubation - infection seems less likely >> hold Abx for now - f/u CXR  ESRD on HD. Hyperkalemia. - per nephrology  Anemia of critical illness and chronic disease. - f/u CBC  Hx of HTN. - continue meds  Hx of seizures. - continue keppra  DVT prophylaxis - SCDs SUP - not indicated Nutrition - NPO Goals of care - full code  CC time 33 minutes   Chesley Mires, MD [place] 08/03/2016, 12:03  AM Pager:  [phone] After 3pm call: [phone]

## 2016-08-02 NOTE — Procedures (Signed)
  I was present at this dialysis session, have reviewed the session itself and made  appropriate changes Kelly Splinter MD [place] pager [phone]   08/02/2016, 3:13 PM

## 2016-08-02 NOTE — Progress Notes (Signed)
Patient ID: Jeremy Mccoy, male   DOB: 10-03-1965, 51 y.o.   MRN: [id number]  Asked to eval by Rapid REsponse.  Had HD earlier with 4 L off but was over 10L up yest. Now more dyspnea. O:  bp 150s sys Lungs diffuse rales and rhonhi CXR diffuse infilt, alv pattern. More UL than lower, CM A: At least a signif component vol xs. Will do extra Hd and see if can avoid entub.  May be some component on infx or bleeding from chronic cocaine usage P Hd, see if improves

## 2016-08-02 NOTE — ED Triage Notes (Signed)
Pt from home with shortness of breath. Pt did not have full dialysis treatment yesterday and feels like he has a lot of fluid on him. Pt started spitting up blood last night. Pt thinks he has spit up about 6oz of blood. Pt normally is not on O2 but today is sating at 96% on NRB and 80% on room air.

## 2016-08-03 LAB — CBC
HCT: 27.9 % — ABNORMAL LOW (ref 39.0–52.0)
HCT: 29.8 % — ABNORMAL LOW (ref 39.0–52.0)
HEMOGLOBIN: 9.1 g/dL — AB (ref 13.0–17.0)
Hemoglobin: 8.7 g/dL — ABNORMAL LOW (ref 13.0–17.0)
MCH: 28.6 pg (ref 26.0–34.0)
MCH: 29.6 pg (ref 26.0–34.0)
MCHC: 30.5 g/dL (ref 30.0–36.0)
MCHC: 31.2 g/dL (ref 30.0–36.0)
MCV: 93.7 fL (ref 78.0–100.0)
MCV: 94.9 fL (ref 78.0–100.0)
PLATELETS: 147 10*3/uL — AB (ref 150–400)
PLATELETS: 150 10*3/uL (ref 150–400)
RBC: 2.94 MIL/uL — ABNORMAL LOW (ref 4.22–5.81)
RBC: 3.18 MIL/uL — AB (ref 4.22–5.81)
RDW: 16 % — ABNORMAL HIGH (ref 11.5–15.5)
RDW: 16.4 % — AB (ref 11.5–15.5)
WBC: 9.7 10*3/uL (ref 4.0–10.5)
WBC: 9.8 10*3/uL (ref 4.0–10.5)

## 2016-08-03 LAB — TROPONIN I
TROPONIN I: 0.04 ng/mL — AB (ref <0.03)
Troponin I: 0.03 ng/mL (ref <0.03)
Troponin I: 0.04 ng/mL (ref <0.03)

## 2016-08-03 LAB — BASIC METABOLIC PANEL
ANION GAP: 13 (ref 5–15)
BUN: 21 mg/dL — ABNORMAL HIGH (ref 6–20)
CHLORIDE: 92 mmol/L — AB (ref 101–111)
CO2: 28 mmol/L (ref 22–32)
Calcium: 9 mg/dL (ref 8.9–10.3)
Creatinine, Ser: 3.65 mg/dL — ABNORMAL HIGH (ref 0.61–1.24)
GFR calc Af Amer: 21 mL/min — ABNORMAL LOW (ref >60)
GFR calc non Af Amer: 18 mL/min — ABNORMAL LOW (ref >60)
Glucose, Bld: 89 mg/dL (ref 65–99)
POTASSIUM: 3.4 mmol/L — AB (ref 3.5–5.1)
Sodium: 133 mmol/L — ABNORMAL LOW (ref 135–145)

## 2016-08-03 LAB — RENAL FUNCTION PANEL
ANION GAP: 16 — AB (ref 5–15)
Albumin: 3.4 g/dL — ABNORMAL LOW (ref 3.5–5.0)
BUN: 41 mg/dL — AB (ref 6–20)
CHLORIDE: 91 mmol/L — AB (ref 101–111)
CO2: 29 mmol/L (ref 22–32)
CREATININE: 6.82 mg/dL — AB (ref 0.61–1.24)
Calcium: 8.7 mg/dL — ABNORMAL LOW (ref 8.9–10.3)
GFR, EST AFRICAN AMERICAN: 10 mL/min — AB (ref >60)
GFR, EST NON AFRICAN AMERICAN: 8 mL/min — AB (ref >60)
GLUCOSE: 100 mg/dL — AB (ref 65–99)
PHOSPHORUS: 6.5 mg/dL — AB (ref 2.5–4.6)
POTASSIUM: 4.8 mmol/L (ref 3.5–5.1)
Sodium: 136 mmol/L (ref 135–145)

## 2016-08-03 LAB — HIV ANTIBODY (ROUTINE TESTING W REFLEX)
HIV SCREEN 4TH GENERATION: NONREACTIVE
HIV Screen 4th Generation wRfx: NONREACTIVE

## 2016-08-03 LAB — MRSA PCR SCREENING: MRSA by PCR: NEGATIVE

## 2016-08-03 NOTE — Progress Notes (Signed)
[facility] Progress Note   Subjective: had to get another HD last night for resp distress.  Breathing better today, still on FM O2.  No new c/o's.  Having some hemoptysis.   Vitals:   08/03/16 0339 08/03/16 0402 08/03/16 0527 08/03/16 0727  BP: (!) 141/97  (!) 140/97   Pulse: 92 96 (!) 105   Resp: (!) 24 (!) 24 (!) 28 (!) 24  Temp: 98.6 F (37 C)   98.6 F (37 C)  TempSrc: Axillary   Oral  SpO2: 100% 100% 98%   Weight: 71.9 kg (158 lb 8.2 oz)   70.5 kg (155 lb 6.8 oz)  Height:        Inpatient medications: . amLODipine  10 mg Oral Daily  . carvedilol  25 mg Oral BID WC  . cinacalcet  90 mg Oral Q supper  . cloNIDine  0.3 mg Oral TID  . darbepoetin (ARANESP) injection - DIALYSIS  200 mcg Intravenous Q Wed-HD  . doxercalciferol  4 mcg Intravenous Q M,W,F-HD  . levETIRAcetam  1,000 mg Oral BID  . losartan  100 mg Oral QHS  . multivitamin  1 tablet Oral QHS  . pantoprazole  20 mg Oral Daily  . sevelamer carbonate  3,200 mg Oral TID WC    sodium chloride, sodium chloride, sodium chloride, sodium chloride, acetaminophen **OR** acetaminophen, alteplase, famotidine, feeding supplement (NEPRO CARB STEADY), heparin, hydrOXYzine, lidocaine (PF), lidocaine-prilocaine, ondansetron **OR** ondansetron (ZOFRAN) IV, pentafluoroprop-tetrafluoroeth, polyethylene glycol  Exam: Alert, less dyspneic today No jvd Chest mild rales bilat bases, occ exp wheezing RRR no mrg Abd soft no ascites Ext edema resolved LUE AVF+bruit  Dialysis: SW MWF 4h 47min   500/800  69.5kg   Hep none   L arm AVF -Hectorol 4 mcg IV q HD -Venofer 50mg  IV q HD  -Mircera 200 mcg IV q 2 weeks       Assessment: 1. Pulmonary edema/hemoptysis/volume overload - secondary to non adherence to dialysis rx refuses to run longer than 3 hours.  Had 2nd HD emergently last night.  Plan short HD again today and will repeat CXR now.  2. Hyperkalemia - resolved 3. ESRD -  MWF HD.  4. Hypertension - on 4 bp  meds, continue 5. Anemia  - 8.9 redose esa 3/21  6. Metabolic bone disease -  Cont VDRA/Renvela binder/Sensipar  7. Nutrition - Renal diet/vitamins 8. H/o chronic cocaine use   Plan - as above   Kelly Splinter MD [employer] pager [phone]   08/03/2016, 11:05 AM    Recent Labs Lab 08/02/16 0948 08/02/16 2358 08/03/16 0207  NA 134* 136 133*  K 6.8* 4.8 3.4*  CL 91* 91* 92*  CO2 24 29 28   GLUCOSE 105* 100* 89  BUN 74* 41* 21*  CREATININE 11.50* 6.82* 3.65*  CALCIUM 9.0 8.7* 9.0  PHOS  --  6.5*  --     Recent Labs Lab 08/02/16 0948 08/02/16 2358  AST 19  --   ALT 19  --   ALKPHOS 121  --   BILITOT 0.6  --   PROT 6.7  --   ALBUMIN 3.7 3.4*    Recent Labs Lab 08/02/16 2234 08/02/16 2358 08/03/16 0207  WBC 10.5 9.7 9.8  HGB 8.9* 8.7* 9.1*  HCT 28.9* 27.9* 29.8*  MCV 94.4 94.9 93.7  PLT 153 147* 150   Iron/TIBC/Ferritin/ %Sat    Component Value Date/Time   IRON 96 02/20/2014 1441   TIBC 223 02/20/2014 1441  FERRITIN 1,290 (H) 02/20/2014 1441   IRONPCTSAT 43 02/20/2014 1441

## 2016-08-03 NOTE — Progress Notes (Signed)
RT NOTE:  RT assisted with transfer to 4E24. No complcations

## 2016-08-03 NOTE — Progress Notes (Addendum)
  Informed by RN that patient was having increased work of breathing after dialysis was finished which removed about 4 L of fluid. Rapid response was called who obtained ABG and put on NBR. Patient had presented with hemoptysis earlier and had about 10 cc of blood every time he expectorated.   He expectorated blood during our encounter in a cup which was about few drops of blood.   Patient had a similar presentation in Mar 2017 for hemoptysis which was precipitated by crack cocaine use, possibly vasculitis. He had a CT chest done in Mar 2017, which showedsource to be in the right lower lobe, and stigmata of smoking related disease like resp bronchiolitis, or ILD.    BP (!) 149/99   Pulse 92   Temp 99.8 F (37.7 C) (Axillary)   Resp (!) 28   Ht 6' (1.829 m)   Wt 159 lb 13.3 oz (72.5 kg)   SpO2 100%   BMI 21.68 kg/m  Arterial Blood Gas result on 100% NBR:  PO2 73.3 ; pCO2 44.4; pH 7.474;  HCO3 32.2, %O2 Sat 94.1.  Exam: General: Vital signs reviewed. Patient has increased work of breathing and is on a non rebreather , but speaking in full sentences  Cardiovascular: regular rate, rhythm, no murmur appreciated  Pulmonary/Chest: diffuse crackles in all fields Extremities: No lower extremity edema bilaterally, left arm has AV graft  Skin: Warm, dry and intact. No rashes or erythema.    Plan: -Discussed with PCCM- Dr. Alva Garnet who recommended transferring to ICU, but pt now is in SDU. They said that PCCM will take over the patient  -Tighter BP control  -Ordered CXR which showed interim worsening of diffuse bilateral pulmonary airspace opacities- possibly worsening pna or fluid overload.  -Stat CBC shows stable HgB  -BiPap -stopped aspirin. Pt is not on AC -INR was normal. PTT was slightly elevated at 41 -He has already been typed and screened  -Renal was consulted also and pt will undergo another HD session to hopefully get more fluid off. -Per PCCM recommendations: ordered repeat ANA,  HIV check, repeat EKG, troponin cycle   Signed  Burgess Estelle, MD MPH PGY-2 IMTS 10:45 PM 08/02/2016

## 2016-08-03 NOTE — Progress Notes (Signed)
While taking note of pt belongings pt would not allow RN to assess belongings for medication and dangerous items. This RN saw OTC bottle of Tylenol and asked pt if I could look in the bottle and pt refused. Pt educated on the importance of not taking home meds during hospital stay. Pt stated "You don't have to worry, I wont take anything".

## 2016-08-03 NOTE — Progress Notes (Signed)
RT NOTE:  RT assisted with transfer to Dialysis. No complications

## 2016-08-03 NOTE — Progress Notes (Signed)
CRITICAL VALUE ALERT  Critical value received:  Troponin 0.04    Date of notification: 07/31/2016  Time of notification: 0300  Critical value read back: yes  Nurse who received alert: L.Kalleigh [place],RN  MD notified (1st page): Dr. Reesa Chew  Time of first page: 0302  Responding MD:  Dr. Reesa Chew  Time MD responded:  [phone]

## 2016-08-03 NOTE — Progress Notes (Signed)
We are backed up in HD today; pt is stable, will plan next HD in am tomorrow, not today.    Kelly Splinter MD [employer] pgr [phone]   08/03/2016, 2:26 PM

## 2016-08-03 NOTE — Progress Notes (Signed)
   Subjective: Jeremy Mccoy was seen and evaluated today at bedside. Feels better than he did overnight although still feels quite bad. Denies any more hemoptysis this morning although did report some hemoptysis last night when he was having significant SOB.   Objective:  Vital signs in last 24 hours: Vitals:   08/03/16 0402 08/03/16 0527 08/03/16 0727 08/03/16 1149  BP:  (!) 140/97    Pulse: 96 (!) 105  (!) 102  Resp: (!) 24 (!) 28 (!) 24 (!) 30  Temp:   98.6 F (37 C) 97.8 F (36.6 C)  TempSrc:   Oral Oral  SpO2: 100% 98%  100%  Weight:   155 lb 6.8 oz (70.5 kg)   Height:       General: Thin, chronically-ill appearing caucasian male resting in bed with head of bed elevated. Venturi mask on. Appears quite fatigued.  Cardiovascular: Regular rate and rhythm. No murmur or rub appreciated. Pulmonary: Coarse rales throughout. +Rhonchi. Using accessory muscles.   Abdomen: No guarding or rigidity. +bowel sounds.  Skin: Warm, dry. No cyanosis.   Assessment/Plan:  Principal Problem:   ESRD on hemodialysis ([place]) Active Problems:   Cocaine abuse   Anemia in chronic kidney disease   Acute respiratory failure (HCC)   COPD (chronic obstructive pulmonary disease) (HCC)   Hemoptysis  Hemoptysis in setting of recent crack cocaine use with history of levimasole-induced vasculitis: Small volume hemoptysis overnight as observed by night team during worsening of his SOB after HD. Seen by PCCM and nephro who urgently dialized him again yesterday. Pts breathing improved since HD however he still requires respiratory assistance, currently in the form of nonrebreather. He does not seem to have good awareness of the consequences of his cocaine abuse and was counseled today on the importance of cessation. He reports some interest in rehab programs currently.  -Hb stable today at 9.1 -Encouraged strict cessation of cocaine abuse -- pt reports some interest in rehab  ESRD on HD secondary to the above: s/p  2 urgent HD sessions. Will likely have another session tomorrow as well. He currently weights 155, was 159 on admission. ~10lbs before dry weight. Nephrology on board and greatly appreciate their assistance.  -HD per nephrology -Electrolytes per nephro -BMET in am  HTN: Hypertensive on admission however improved with 2 HD sessions and his home BP meds. Will continue Amlodipine, Coreg, Clonidine and Losartan. -Holding hydral currently, consider resuming if pt persistently hypertensive  COPD: Not on any inhalers at home nor is he on supplemental O2 at home. Continues to require oxygen support   Polysubstance abuse: As above, will check HIV and HepC Abs  Dispo: Anticipated discharge pending improvement of pts respiratory status.   Eliel Dudding, DO 08/03/2016, 1:53 PM Pager: [phone]

## 2016-08-04 ENCOUNTER — Inpatient Hospital Stay (HOSPITAL_COMMUNITY): Payer: Medicare Other

## 2016-08-04 DIAGNOSIS — N186 End stage renal disease: Secondary | ICD-10-CM

## 2016-08-04 DIAGNOSIS — J42 Unspecified chronic bronchitis: Secondary | ICD-10-CM

## 2016-08-04 DIAGNOSIS — Z992 Dependence on renal dialysis: Secondary | ICD-10-CM

## 2016-08-04 DIAGNOSIS — F1410 Cocaine abuse, uncomplicated: Secondary | ICD-10-CM

## 2016-08-04 DIAGNOSIS — F14288 Cocaine dependence with other cocaine-induced disorder: Secondary | ICD-10-CM

## 2016-08-04 LAB — BASIC METABOLIC PANEL
ANION GAP: 15 (ref 5–15)
Anion gap: 20 — ABNORMAL HIGH (ref 5–15)
BUN: 71 mg/dL — AB (ref 6–20)
BUN: 49 mg/dL — ABNORMAL HIGH (ref 6–20)
CHLORIDE: 90 mmol/L — AB (ref 101–111)
CHLORIDE: 93 mmol/L — AB (ref 101–111)
CO2: 28 mmol/L (ref 22–32)
CO2: 26 mmol/L (ref 22–32)
CREATININE: 6.19 mg/dL — AB (ref 0.61–1.24)
Calcium: 9.4 mg/dL (ref 8.9–10.3)
Calcium: 9.8 mg/dL (ref 8.9–10.3)
Creatinine, Ser: 8.37 mg/dL — ABNORMAL HIGH (ref 0.61–1.24)
GFR calc Af Amer: 8 mL/min — ABNORMAL LOW (ref >60)
GFR calc non Af Amer: 7 mL/min — ABNORMAL LOW (ref >60)
GFR calc non Af Amer: 9 mL/min — ABNORMAL LOW (ref >60)
GFR, EST AFRICAN AMERICAN: 11 mL/min — AB (ref >60)
GLUCOSE: 130 mg/dL — AB (ref 65–99)
Glucose, Bld: 113 mg/dL — ABNORMAL HIGH (ref 65–99)
POTASSIUM: 3.9 mmol/L (ref 3.5–5.1)
Potassium: 4.4 mmol/L (ref 3.5–5.1)
Sodium: 138 mmol/L (ref 135–145)
Sodium: 134 mmol/L — ABNORMAL LOW (ref 135–145)

## 2016-08-04 LAB — CBC
HCT: 28.3 % — ABNORMAL LOW (ref 39.0–52.0)
HEMATOCRIT: 29 % — AB (ref 39.0–52.0)
HEMOGLOBIN: 8.9 g/dL — AB (ref 13.0–17.0)
HEMOGLOBIN: 9 g/dL — AB (ref 13.0–17.0)
MCH: 29.7 pg (ref 26.0–34.0)
MCH: 28.9 pg (ref 26.0–34.0)
MCHC: 31.4 g/dL (ref 30.0–36.0)
MCHC: 31 g/dL (ref 30.0–36.0)
MCV: 94.3 fL (ref 78.0–100.0)
MCV: 93.2 fL (ref 78.0–100.0)
Platelets: 168 10*3/uL (ref 150–400)
Platelets: 197 10*3/uL (ref 150–400)
RBC: 3 MIL/uL — ABNORMAL LOW (ref 4.22–5.81)
RBC: 3.11 MIL/uL — ABNORMAL LOW (ref 4.22–5.81)
RDW: 16.1 % — ABNORMAL HIGH (ref 11.5–15.5)
RDW: 15.5 % (ref 11.5–15.5)
WBC: 8.2 10*3/uL (ref 4.0–10.5)
WBC: 6.7 10*3/uL (ref 4.0–10.5)

## 2016-08-04 LAB — ANTINUCLEAR ANTIBODIES, IFA: ANA Ab, IFA: POSITIVE — AB

## 2016-08-04 LAB — FANA STAINING PATTERNS: Homogeneous Pattern: 1:160 {titer} — ABNORMAL HIGH

## 2016-08-04 MED ORDER — DARBEPOETIN ALFA 200 MCG/0.4ML IJ SOSY
PREFILLED_SYRINGE | INTRAMUSCULAR | Status: AC
  Filled 2016-08-04: qty 0.4

## 2016-08-04 MED ORDER — HEPARIN SODIUM (PORCINE) 1000 UNIT/ML DIALYSIS
1000.0000 [IU] | INTRAMUSCULAR | Status: DC | PRN
  Filled 2016-08-04: qty 1

## 2016-08-04 MED ORDER — CALCIUM CARBONATE ANTACID 500 MG PO CHEW
CHEWABLE_TABLET | ORAL | Status: AC
  Filled 2016-08-04: qty 2

## 2016-08-04 MED ORDER — DOXERCALCIFEROL 4 MCG/2ML IV SOLN
INTRAVENOUS | Status: AC
  Filled 2016-08-04: qty 2

## 2016-08-04 MED ORDER — LIDOCAINE-PRILOCAINE 2.5-2.5 % EX CREA
1.0000 "application " | TOPICAL_CREAM | CUTANEOUS | Status: DC | PRN
  Filled 2016-08-04: qty 5

## 2016-08-04 MED ORDER — DARBEPOETIN ALFA 200 MCG/0.4ML IJ SOSY
200.0000 ug | PREFILLED_SYRINGE | INTRAMUSCULAR | Status: DC
  Administered 2016-08-04: 200 ug via INTRAVENOUS
  Filled 2016-08-04: qty 0.4

## 2016-08-04 MED ORDER — CALCIUM CARBONATE ANTACID 500 MG PO CHEW
1.0000 | CHEWABLE_TABLET | Freq: Three times a day (TID) | ORAL | Status: DC | PRN
  Administered 2016-08-04: 200 mg via ORAL
  Filled 2016-08-04 (×2): qty 1

## 2016-08-04 MED ORDER — ALTEPLASE 2 MG IJ SOLR: 2.0000 mg | Freq: Once | INTRAMUSCULAR | Status: DC | PRN

## 2016-08-04 MED ORDER — LIDOCAINE HCL (PF) 1 % IJ SOLN: 5.0000 mL | INTRAMUSCULAR | Status: DC | PRN

## 2016-08-04 MED ORDER — SODIUM CHLORIDE 0.9 % IV SOLN: 100.0000 mL | INTRAVENOUS | Status: DC | PRN

## 2016-08-04 MED ORDER — LEVETIRACETAM 500 MG PO TABS: 1000.0000 mg | ORAL_TABLET | Freq: Every evening | ORAL | Status: DC

## 2016-08-04 MED ORDER — DARBEPOETIN ALFA 200 MCG/0.4ML IJ SOSY: 200.0000 ug | PREFILLED_SYRINGE | INTRAMUSCULAR | Status: DC

## 2016-08-04 MED ORDER — PENTAFLUOROPROP-TETRAFLUOROETH EX AERO: 1.0000 "application " | INHALATION_SPRAY | CUTANEOUS | Status: DC | PRN

## 2016-08-04 NOTE — Progress Notes (Signed)
  Date: 08/04/2016  Patient name: Jeremy Mccoy  Medical record number: [id number]  Date of birth: 1965-11-17   This patient's plan of care was discussed with the house staff. Please see their note for complete details. I concur with their findings.  Patient was much more comfortable today, sitting in bed with [place] at 4L watching TV while undergoing HD. On exam less distress and use of accessory muscles. His lungs are much, much clearer today.    Truman Hayward, MD 08/04/2016, 11:27 AM

## 2016-08-04 NOTE — Progress Notes (Signed)
New Admission Note: transfer from 4E  Arrival Method: bed Mental Orientation: a.o x4 Telemetry: placed Assessment: Completed Skin: clean dry intact. Small bruise on R lateral ankle IV: RFA SL Pain: none Tubes: none Safety Measures: Safety Fall Prevention Plan has been given, discussed and signed Admission: Completed Unit Orientation: Patient has been orientated to the room, unit and staff.  Family:not present  Orders have been reviewed and implemented. Will continue to monitor the patient. Call light has been placed within reach and bed alarm has been activated.   Retta Mac BSN, RN

## 2016-08-04 NOTE — Progress Notes (Signed)
Transitions of Care Pharmacy Note  Plan:  Educated on medication dose changes that occurred while in hospital  Addressed concerns regarding: - Keppra - please consider continuing this as an outpatient since patient reports benefit with taking medication and experiences no side effects - would need a prescription sent to pharmacy as he has none at home - Heartburn not resolved by Tums - advised to ask RN for Pepcid, asked RN in hallway to give patient Pepcid as already ordered prn  Recommend: - Keppra prescription for 1000 mg po qhs (renally adjusted) to be sent to pharmacy   Follow-up: - BP before re-initiation of home hydralazine - BP elevated today, but patient has missed AM doses of BP meds --------------------------------------------- Jeremy Mccoy is an 51 y.o. male who presents with a chief complaint of hemoptysis. In anticipation of discharge, pharmacy has reviewed this patient's prior to admission medication history, as well as current inpatient medications listed per the [facility].  Current medication indications, dosing, frequency, and notable side effects reviewed with patient. Patient verbalized understanding of current inpatient medication regimen and is aware that the After Visit Summary when presented, will represent the most accurate medication list at discharge.   Jeremy Mccoy expressed concerns regarding Keppra prescription on discharge. Previous to admission, he ran out of [place] for 1 month and was denied a refill without neurologist consult per patient. He reports that during the past month he has experienced jerks, such as hitting himself in the head and almost falling out of bed without his control. Since being in the hospital, and back on Keppra, he states these jerking motions have stopped. He doesn't have any Keppra at home.   He also expressed that he was having terrible heartburn for which Tums did not improve.   Assessment: Understanding of regimen:  good Understanding of indications: good Potential of compliance: fair Barriers to Obtaining Medications: No - patient states he is able to afford and get his medications without issue - uses [facility]  Patient instructed to contact inpatient pharmacy team with further questions or concerns if needed.    Time spent preparing for discharge counseling: 15 minutes Time spent counseling patient: 10 minutes   Thank you for allowing pharmacy to be a part of this patient's care.  Belia Heman, PharmD PGY1 Pharmacy Resident [phone] (Pager) 08/04/2016 5:46 PM

## 2016-08-04 NOTE — Progress Notes (Signed)
PCCM Consult Note  Admission date: 08/02/2016 Consult date: 08/02/2016 Referring provider: Dr. Tommy Medal  CC: Short of breath  HPI: 51 yo male presented to ER with dyspnea.  He reports having cough with blood in sputum.  He was noted to be hypoxic and have b/l infiltrates on CXR.  He has hx of ESRD on HD.  He has hx of cocaine abuse.  He has several previous admissions for similar findings.  He also had chest tightness.  He reports smoking crack cocaine one day prior to admission.  He is not consistent with his HD treatments.  Renal consulted, and he was up for HD.  In spite of fluid removal with HD, he had progressive dyspnea and hypoxia, and PCCM asked to assess.  He was transferred to SDU and started on Bipap >> says he loves it and wishes this was started sooner.    Vital signs: BP 125/83   Pulse 100   Temp 97.8 F (36.6 C) (Oral)   Resp (!) 22   Ht 6' (1.829 m)   Wt 72.1 kg (158 lb 15.2 oz)   SpO2 94%   BMI 21.56 kg/m   Intake/output: I/O last 3 completed shifts: In: 360 [P.O.:360] Out: 3500 [Other:3500]  General: sitting up in bed, alert, speaks in full sentences, stable Neuro: normal strength, moves extremities HEENT: Pupils reactive Cardiac: regular, no murmur Chest:decrease bases  Abd: soft, non tender, + bowel sounds Ext: no edema, AV graft Lt arm Skin: no rashes   CMP Latest Ref Rng & Units 08/04/2016 08/03/2016 08/02/2016  Glucose 65 - 99 mg/dL 130(H) 89 100(H)  BUN 6 - 20 mg/dL 71(H) 21(H) 41(H)  Creatinine 0.61 - 1.24 mg/dL 8.37(H) 3.65(H) 6.82(H)  Sodium 135 - 145 mmol/L 138 133(L) 136  Potassium 3.5 - 5.1 mmol/L 3.9 3.4(L) 4.8  Chloride 101 - 111 mmol/L 90(L) 92(L) 91(L)  CO2 22 - 32 mmol/L 28 28 29   Calcium 8.9 - 10.3 mg/dL 9.4 9.0 8.7(L)  Total Protein 6.5 - 8.1 g/dL - - -  Total Bilirubin 0.3 - 1.2 mg/dL - - -  Alkaline Phos 38 - 126 U/L - - -  AST 15 - 41 U/L - - -  ALT 17 - 63 U/L - - -     CBC Latest Ref Rng & Units 08/04/2016 08/03/2016 08/02/2016   WBC 4.0 - 10.5 K/uL 8.2 9.8 9.7  Hemoglobin 13.0 - 17.0 g/dL 8.9(L) 9.1(L) 8.7(L)  Hematocrit 39.0 - 52.0 % 28.3(L) 29.8(L) 27.9(L)  Platelets 150 - 400 K/uL 168 150 147(L)     ABG    Component Value Date/Time   PHART 7.474 (H) 08/02/2016 2114   PCO2ART 44.4 08/02/2016 2114   PO2ART 73.3 (L) 08/02/2016 2114   HCO3 32.2 (H) 08/02/2016 2114   TCO2 19 02/23/2016 1135   ACIDBASEDEF 7.0 (H) 01/21/2014 0734   O2SAT 94.1 08/02/2016 2114     CBG (last 3)  No results for input(s): GLUCAP in the last 72 hours.   Imaging: Dg Chest Port 1 View  Result Date: 08/04/2016 CLINICAL DATA:  Pulmonary edema appeared congestive heart failure. EXAM: PORTABLE CHEST 1 VIEW COMPARISON:  08/02/2016 FINDINGS: Bilateral asymmetric airspace filling pattern has worsened, more extensively involving the lower lungs presently. Findings more consistent with pneumonia/ARDS rather than cardiogenic pulmonary edema. No effusions. No bone abnormality. IMPRESSION: Worsening of bilateral asymmetric airspace filling consistent with ARDS/pneumonia. Electronically Signed   By: Nelson Chimes M.D.   On: 08/04/2016 07:30   Dg Chest  Port 1 View  Result Date: 08/02/2016 CLINICAL DATA:  Dyspnea EXAM: PORTABLE CHEST 1 VIEW COMPARISON:  08/02/2016, 02/24/2016 FINDINGS: Interval worsening of bilateral pulmonary airspace opacities with upper lobe predominance. There is cardiomegaly. No pleural effusion. No pneumothorax. IMPRESSION: Interim worsening of diffuse bilateral pulmonary a airspace opacities with upper lobe predominance. Opacities are nodular in appearance. Findings could be secondary to worsening pneumonia, edema, or pulmonary nodules. CT previously recommended for further evaluation. Electronically Signed   By: Donavan Foil M.D.   On: 08/02/2016 23:01     Studies:  Antibiotics:  Cultures:  Lines/tubes:  Events: 3/20 Admit, renal consulted  Summary: 51 yo male with acute hypoxic respiratory failure and  hemoptysis with b/l pulmonary infiltrates in setting of crack cocaine use and hx of ESRD with inconsistent compliance with HD.  Assessment/plan:  Acute hypoxic respiratory failure. Resolved 3/22. PCCM will sign off 3/22 - oxygen to keep SpO2 > 92%, down to 2l - Bipap >> off - infection seems less likely >> hold Abx for now - f/u CXR  ESRD on HD. Hyperkalemia. - per nephrology  Anemia of critical illness and chronic disease. - f/u CBC  Hx of HTN. - continue meds  Hx of seizures. - continue keppra  DVT prophylaxis - SCDs SUP - not indicated Nutrition - NPO Goals of care - full code  [facility] Minor ACNP Maryanna Shape PCCM Pager [phone] till 3 pm If no answer page [phone] 08/04/2016, 11:28 AM    Attending Note:  I have examined patient, reviewed labs, studies and notes. I have discussed the case with S Minor, and I agree with the data and plans as amended above. 51 year old man with a history of end-stage renal disease, poor compliance with hemodialysis, cocaine use. He was admitted in respiratory distress with a pulmonary edema and blood-tinged sputum. He temporarily required BiPAP. He's had significant volume removal with serial rounds of hemodialysis. He is actually 3 kg below his dry weight. he's having no more cough or hemoptysis. We will sign off of hiscase. Please call we can assist in any way.   Baltazar Apo, MD, PhD 08/04/2016, 4:00 PM [facility] [phone] or if no answer [phone]

## 2016-08-04 NOTE — Progress Notes (Signed)
[facility] Progress Note   Subjective: on HD now, breathing much better overall.  No c/o's, hemoptysis improved.   Vitals:   08/04/16 1045 08/04/16 1115 08/04/16 1145 08/04/16 1215  BP: (!) 101/55 125/83 120/83 110/80  Pulse: 97 100 (!) 101 96  Resp: (!) 22 (!) 22 (!) 22 20  Temp:    97.2 F (36.2 C)  TempSrc:    Oral  SpO2: 94% 94% 95% 94%  Weight:    67 kg (147 lb 11.3 oz)  Height:        Inpatient medications: . amLODipine  10 mg Oral Daily  . calcium carbonate      . carvedilol  25 mg Oral BID WC  . cinacalcet  90 mg Oral Q supper  . cloNIDine  0.3 mg Oral TID  . Darbepoetin Alfa      . darbepoetin (ARANESP) injection - DIALYSIS  200 mcg Intravenous Q Thu-HD  . doxercalciferol      . doxercalciferol  4 mcg Intravenous Q M,W,F-HD  . [START ON 08/05/2016] levETIRAcetam  1,000 mg Oral QPM  . losartan  100 mg Oral QHS  . multivitamin  1 tablet Oral QHS  . pantoprazole  20 mg Oral Daily  . sevelamer carbonate  3,200 mg Oral TID WC    sodium chloride, sodium chloride, acetaminophen **OR** acetaminophen, alteplase, calcium carbonate, famotidine, feeding supplement (NEPRO CARB STEADY), heparin, hydrOXYzine, lidocaine (PF), lidocaine-prilocaine, ondansetron **OR** ondansetron (ZOFRAN) IV, pentafluoroprop-tetrafluoroeth, polyethylene glycol  Exam: Alert, no distress, on HD No jvd Chest mostly clear bilat RRR no mrg Abd soft no ascites Ext edema resolved LUE AVF+bruit  Dialysis: SW MWF 4h 93min   500/800  69.5kg   Hep none   L arm AVF -Hectorol 4 mcg IV q HD -Venofer 50mg  IV q HD  -Mircera 200 mcg IV q 2 weeks       Assessment: 1. Acute resp failure - CXR much worse but pt much better, down to dry wt now. Suspect combination of pulm edema and something else - pulm hemorrhage or cocaine pneumonitis; prob the latter 2. Hyperkalemia - resolved 3. ESRD -  MWF HD.  4. Hypertension - on 4 bp meds, continue 5. Anemia  - 8.9 redose esa 3/21  6. Metabolic  bone disease -  Cont VDRA/Renvela binder/Sensipar  7. Nutrition - Renal diet/vitamins 8. H/o chronic cocaine use   Plan - HD today and then short HD Friday to get back on schedule. Watch CXR, not sure anything to be done for cocaine lung injury?   Kelly Splinter MD [facility] pager [phone]   08/04/2016, 3:06 PM    Recent Labs Lab 08/02/16 2358 08/03/16 0207 08/04/16 0800  NA 136 133* 138  K 4.8 3.4* 3.9  CL 91* 92* 90*  CO2 29 28 28   GLUCOSE 100* 89 130*  BUN 41* 21* 71*  CREATININE 6.82* 3.65* 8.37*  CALCIUM 8.7* 9.0 9.4  PHOS 6.5*  --   --     Recent Labs Lab 08/02/16 0948 08/02/16 2358  AST 19  --   ALT 19  --   ALKPHOS 121  --   BILITOT 0.6  --   PROT 6.7  --   ALBUMIN 3.7 3.4*    Recent Labs Lab 08/02/16 2358 08/03/16 0207 08/04/16 0800  WBC 9.7 9.8 8.2  HGB 8.7* 9.1* 8.9*  HCT 27.9* 29.8* 28.3*  MCV 94.9 93.7 94.3  PLT 147* 150 168   Iron/TIBC/Ferritin/ %Sat  Component Value Date/Time   IRON 96 02/20/2014 1441   TIBC 223 02/20/2014 1441   FERRITIN 1,290 (H) 02/20/2014 1441   IRONPCTSAT 43 02/20/2014 1441

## 2016-08-04 NOTE — Progress Notes (Signed)
   Subjective: Jeremy Mccoy was seen and evaluated today at bedside during HD. Reports feeling well overnight and wants to be discharged. Requests stopping the venturi mask.   Objective:  Vital signs in last 24 hours: Vitals:   08/04/16 0843 08/04/16 0850 08/04/16 0920 08/04/16 0945  BP: (!) 99/50 (!) 104/57 121/79 107/74  Pulse: 96 97 100 100  Resp: (!) 21 (!) 21 (!) 22 (!) 22  Temp: 97.8 F (36.6 C)     TempSrc: Oral     SpO2: 98% 98% 92% 92%  Weight:      Height:       General: Thin, chronically-ill appearing caucasian male resting in bed on HD. On [place] and in no distress.  Cardiovascular: Regular rate and rhythm. No murmur or rub appreciated. Pulmonary: Lungs much improved today however with some crackles. Breathing unlabored.    Abdomen: No guarding or rigidity. +bowel sounds.  Skin: Warm, dry. No cyanosis. No peripheral edema appreciated.   Assessment/Plan:  Principal Problem:   ESRD on hemodialysis ([place]) Active Problems:   Cocaine abuse   Anemia in chronic kidney disease   Acute respiratory failure (HCC)   COPD (chronic obstructive pulmonary disease) (HCC)   Hemoptysis  Hemoptysis in setting of recent crack cocaine use with history of levimasole-induced vasculitis:  Pt continues to complain of coughing up small volume of dark red blood. Hb stable today at 8.9. Breathing significantly improved since admission and requests trial off venturi mask to see how he saturates. Will d/c venturi mask and transition to [place]. Was counseled again today on the importance of cocaine cessation. He reports some interest in rehab programs currently.  -Hb stable today at 8.9 -Pt reports some interest in rehab, will consult SW for assistance in providing patient with information.  ESRD on HD secondary to the above: s/p 2 urgent HD sessions. Was unable to have HD session yesterday due to back-up of schedule. Postponed until today as pt HD stable currently. Was seen during HD. Nephrology on board  and greatly appreciate their assistance.  -HD per nephrology -Electrolytes per nephro  HTN: Hypertensive on admission however improved with 2 HD sessions and his home BP meds. Will continue Amlodipine, Coreg, Clonidine and Losartan. -Holding hydral currently, consider resuming if pt persistently hypertensive  COPD: Not on any inhalers at home nor is he on supplemental O2 at home. Continues to require oxygen support   Polysubstance abuse: As above, will check HIV and HepC Abs  Dispo: Anticipated discharge pending improvement of pts respiratory status.   Naylin Burkle, DO 08/04/2016, 11:18 AM Pager: [phone]

## 2016-08-05 LAB — RENAL FUNCTION PANEL
Albumin: 3.1 g/dL — ABNORMAL LOW (ref 3.5–5.0)
Anion gap: 18 — ABNORMAL HIGH (ref 5–15)
BUN: 63 mg/dL — ABNORMAL HIGH (ref 6–20)
CALCIUM: 9.9 mg/dL (ref 8.9–10.3)
CO2: 22 mmol/L (ref 22–32)
CREATININE: 7.46 mg/dL — AB (ref 0.61–1.24)
Chloride: 95 mmol/L — ABNORMAL LOW (ref 101–111)
GFR, EST AFRICAN AMERICAN: 9 mL/min — AB (ref >60)
GFR, EST NON AFRICAN AMERICAN: 8 mL/min — AB (ref >60)
Glucose, Bld: 82 mg/dL (ref 65–99)
Phosphorus: 5.3 mg/dL — ABNORMAL HIGH (ref 2.5–4.6)
Potassium: 4.4 mmol/L (ref 3.5–5.1)
Sodium: 135 mmol/L (ref 135–145)

## 2016-08-05 LAB — CBC
HEMATOCRIT: 28.7 % — AB (ref 39.0–52.0)
HEMOGLOBIN: 9 g/dL — AB (ref 13.0–17.0)
MCH: 29.2 pg (ref 26.0–34.0)
MCHC: 31.4 g/dL (ref 30.0–36.0)
MCV: 93.2 fL (ref 78.0–100.0)
Platelets: 205 10*3/uL (ref 150–400)
RBC: 3.08 MIL/uL — AB (ref 4.22–5.81)
RDW: 15.7 % — AB (ref 11.5–15.5)
WBC: 6.5 10*3/uL (ref 4.0–10.5)

## 2016-08-05 LAB — HCV COMMENT:

## 2016-08-05 LAB — HEPATITIS C ANTIBODY (REFLEX): HCV Ab: <0.1 s/co ratio (ref 0.0–0.9)

## 2016-08-05 MED ORDER — POLYETHYLENE GLYCOL 3350 17 G PO PACK: 17.0000 g | PACK | Freq: Every day | ORAL | 0 refills | Status: DC | PRN

## 2016-08-05 MED ORDER — LIDOCAINE-PRILOCAINE 2.5-2.5 % EX CREA: 1.0000 "application " | TOPICAL_CREAM | CUTANEOUS | Status: DC | PRN

## 2016-08-05 MED ORDER — SODIUM CHLORIDE 0.9 % IV SOLN: 100.0000 mL | INTRAVENOUS | Status: DC | PRN

## 2016-08-05 MED ORDER — LIDOCAINE HCL (PF) 1 % IJ SOLN: 5.0000 mL | INTRAMUSCULAR | Status: DC | PRN

## 2016-08-05 MED ORDER — PENTAFLUOROPROP-TETRAFLUOROETH EX AERO
1.0000 | INHALATION_SPRAY | CUTANEOUS | Status: DC | PRN
Start: 2016-08-05 — End: 2016-08-05

## 2016-08-05 MED ORDER — LEVETIRACETAM 1000 MG PO TABS: 1000.0000 mg | ORAL_TABLET | Freq: Every evening | ORAL | 0 refills | Status: DC

## 2016-08-05 MED ORDER — MIDODRINE HCL 5 MG PO TABS
ORAL_TABLET | ORAL | Status: AC
  Filled 2016-08-05: qty 2

## 2016-08-05 MED ORDER — ALTEPLASE 2 MG IJ SOLR: 2.0000 mg | Freq: Once | INTRAMUSCULAR | Status: DC | PRN

## 2016-08-05 MED ORDER — DOXERCALCIFEROL 4 MCG/2ML IV SOLN
INTRAVENOUS | Status: AC
  Administered 2016-08-05: 4 ug via INTRAVENOUS
  Filled 2016-08-05: qty 2

## 2016-08-05 MED ORDER — ACETAMINOPHEN 500 MG PO TABS: 500.0000 mg | ORAL_TABLET | Freq: Three times a day (TID) | ORAL | 0 refills | Status: AC | PRN

## 2016-08-05 MED ORDER — HEPARIN SODIUM (PORCINE) 1000 UNIT/ML DIALYSIS: 1000.0000 [IU] | INTRAMUSCULAR | Status: DC | PRN

## 2016-08-05 NOTE — Progress Notes (Signed)
Pt refused wheelchair for discharge. Pt wants to walk out alone.

## 2016-08-05 NOTE — Progress Notes (Signed)
SATURATION QUALIFICATIONS:  Patient Saturations on Room Air at Rest = 94%  Patient Saturations on Room Air while Ambulating = 96%

## 2016-08-05 NOTE — Discharge Summary (Signed)
Name: Jeremy Mccoy MRN: [id number] DOB: [birth year] 51 y.o. PCP: Leamon Arnt, MD  Date of Admission: 08/02/2016  9:29 AM Date of Discharge: 08/05/2016 Attending Physician: Truman Hayward, MD  Discharge Diagnosis: 1. Acute Respiratory Failure with Hypoxia 2. Cocaine-induced vasculitis 3. ESRD on HD  Principal Problem:   ESRD on hemodialysis ([place]) Active Problems:   Cocaine abuse   Anemia in chronic kidney disease   Respiratory failure (HCC)   COPD (chronic obstructive pulmonary disease) ([place])   Hemoptysis  Discharge Medications: Allergies as of 08/05/2016   No Known Allergies     Medication List    STOP taking these medications   hydrALAZINE 25 MG tablet Commonly known as:  APRESOLINE     TAKE these medications   acetaminophen 500 MG tablet Commonly known as:  TYLENOL Take 1 tablet (500 mg total) by mouth every 8 (eight) hours as needed for mild pain or fever. 6 times a day, 500 mg, 12 to 24 tabs a day, 6,000 to 12,000 mg daily for pain What changed:  how much to take  when to take this  reasons to take this   amLODipine 10 MG tablet Commonly known as:  NORVASC Take 1 tablet (10 mg total) by mouth daily.   aspirin 81 MG chewable tablet Chew 1 tablet (81 mg total) by mouth daily.   carvedilol 25 MG tablet Commonly known as:  COREG Take 1.5 tablets (37.5 mg total) by mouth 2 (two) times daily with a meal. On Monday-Wednesday-Friday-Sunday (non-HD days); on dialysis days (Tuesday-Thursday-Saturday) take it once a day (evening time only). What changed:  how much to take  additional instructions   cloNIDine 0.3 MG tablet Commonly known as:  CATAPRES Take 0.3 mg by mouth 3 (three) times daily.   feeding supplement (NEPRO CARB STEADY) Liqd Take 237 mLs by mouth 3 (three) times daily as needed (Supplement).   hydrOXYzine 25 MG tablet Commonly known as:  ATARAX/VISTARIL Take 25 mg by mouth 3 (three) times daily as needed for itching.     levETIRAcetam 1000 MG tablet Commonly known as:  KEPPRA Take 1 tablet (1,000 mg total) by mouth every evening.   losartan 100 MG tablet Commonly known as:  COZAAR Take 100 mg by mouth at bedtime.   multivitamin Tabs tablet Take 1 tablet by mouth at bedtime.   pantoprazole 20 MG tablet Commonly known as:  PROTONIX Take 20 mg by mouth daily.   polyethylene glycol packet Commonly known as:  MIRALAX / GLYCOLAX Take 17 g by mouth daily as needed for mild constipation.   ranitidine 300 MG tablet Commonly known as:  ZANTAC Take 300 mg by mouth daily as needed for heartburn.   SENSIPAR 60 MG tablet Generic drug:  cinacalcet Take 60 mg by mouth daily.   sevelamer carbonate 800 MG tablet Commonly known as:  RENVELA Take 2 tablets (1,600 mg total) by mouth 3 (three) times daily with meals. What changed:  how much to take  additional instructions       Disposition and follow-up:   [name] was discharged from [facility] in stable condition.  At the hospital follow up visit please address:  1.  Hemoptysis, Crack Cocaine Use: Please inquire about patients recreational drug use as its key that he avoids any cocaine use due to his vasculitis. All cocaine in the [place] contains levamisole, the ingredient responsible for his vasculitis and subsequent hemoptysis.  ESRD: Please ensure patient is completing all  of his HD sessions as he presented volume overloaded on admission.   2.  Labs / imaging needed at time of follow-up: CBC. Could consider further antibody testing given his +ANA  3.  Pending labs/ test needing follow-up: none.  Follow-up Appointments: Follow-up Information    ANDY,CAMILLE L, MD In 1 week.   Specialty:  Family Medicine Why:  Appointment date on 08/26/2016 3:30p  Contact information: [place] [address] [address] [phone]           Hospital Course by problem list: Principal Problem:   ESRD on  hemodialysis [facility]) Active Problems:   Cocaine abuse   Anemia in chronic kidney disease   Respiratory failure ([place])   COPD (chronic obstructive pulmonary disease) (HCC)   Hemoptysis   1. Hemoptysis secondary to Levamisole-induced vasculitis in the setting of recent crack cocaine use This is a 51 year old man with history of levamisole-induced vasculitis and end-stage renal disease who presented for evaluation of hemoptysis, shortness of breath and volume overload. Patient reports spitting up" 6 ounces" of blood last night following crack cocaine use, which is known to cause hemoptysis in this patient due to vasculitis. Hemoglobin was stable on admission at 8.9 and was not hypotensive. He had several small volume episodes of dark red hemoptysis during admission where this had resolved at time of discharge due to abstinence of cocaine. ANA was positive with a 1-160 homogenous titer consistent with either systemic lupus erythematosus versus drug-induced lupus. Levamisole vasculitis is associated with drug-induced lupus. Hemoglobin at discharge 9.0.  2. Hypoxic respiratory failure secondary to volume overload in setting of End-stage renal disease on hemodialysis Patient has end-stage renal disease secondary to the above vasculitis who presented significantly volume overloaded due to hemodialysis noncompliance. He was initially hypoxic on presentation and is placed on CPAP and BiPAP with improvement of his symptoms then placed on nonrebreather mask. Chest x-ray significant for diffuse bilateral pulmonary edema. Nephrology was consulted who emergently dialyzed pt the day of admission and again that night with significant improvement of his shortness of breath. Additionally received a another brief hemodialysis session the day of discharge. At time of discharge patient had no more dyspnea and was saturating 96% on room air with ambulation. He had reached his estimated dry weight prior to discharge.  3.  Hyperkalemia Potassium 6.8 on admission however this normalized with hemodialysis sessions.  Discharge Vitals:   BP (!) 148/91 (BP Location: Right Arm)   Pulse (!) 102   Temp 97.7 F (36.5 C) (Oral)   Resp 20   Ht 6' (1.829 m)   Wt 143 lb 4.8 oz (65 kg)   SpO2 91%   BMI 19.43 kg/m   Pertinent Labs, Studies, and Procedures:  Chest x-ray: Bilateral airspace disease consistent with pulmonary edema, right greater than left hemoglobin 8.9 on admission, 9.0 on discharge Platelets normal at 168,000 Potassium 6.8 on admission EKG: Without overt stigmata of hyperkalemia 3 hemodialysis sessions  Discharge Instructions:  Your admitted due to hemoptysis and volume overload. Your hemoptysis was due to your recent crack cocaine use and will likely continue to have hemoptysis with continued use. It is important that you abstain from any cocaine use has nearly all cocaine in the [place] contains Levamisole which is the agent responsible for your bleeding. In addition he received 3 hemodialysis sessions during your admission secondary to volume overload. It is important that you complete the entirety of all of your hemodialysis sessions.  Signed: Beatrix Breece, DO  08/05/2016, 12:32 PM   Pager: [phone]

## 2016-08-05 NOTE — Progress Notes (Signed)
   Subjective: Jeremy Mccoy was seen and evaluated today at bedside after his brief HD session today. No complaints. Able to ambulate around room without dyspnea. Reports breathing back to BL and hopes for d/c today. No more hemoptysis.   Objective:  Vital signs in last 24 hours: Vitals:   08/05/16 0845 08/05/16 0900 08/05/16 0913 08/05/16 0938  BP: (!) 144/95 135/78 (!) 146/97 (!) 148/91  Pulse: 90 96 91 (!) 102  Resp:   (!) 23 20  Temp:   97.7 F (36.5 C) 97.7 F (36.5 C)  TempSrc:   Oral Oral  SpO2:   94% 91%  Weight:   143 lb 4.8 oz (65 kg)   Height:       General: Thin, chronically-ill appearing caucasian male resting in bed. Breathing comfortably on RA. Cardiovascular: Regular rate and rhythm. No murmur or rub appreciated. Pulmonary: Much improved, only faint scattered crackles. Breathing unlabored.    Abdomen: No guarding or rigidity. +bowel sounds.  Skin: Warm, dry. No cyanosis. No peripheral edema appreciated.   Assessment/Plan:  Principal Problem:   ESRD on hemodialysis ([place]) Active Problems:   Cocaine abuse   Anemia in chronic kidney disease   Respiratory failure (HCC)   COPD (chronic obstructive pulmonary disease) (HCC)   Hemoptysis  Hemoptysis in setting of recent crack cocaine use with history of levimasole-induced vasculitis:  No hemoptysis overnight and Hb stable at 9. He is saturating well on RA and is without distress. Ambulated and saturated 96%. He was again counseled on the importance of cocaine cessation. Of note, lab results returned +ANA and +1:160 with a homogenous staining pattern. This is c/w his drug-induced vasculitis.   ESRD on HD secondary to the above: s/p 3 HD sessions and is now on-schedule to resume Monday.  -HD per nephrology -Electrolytes per nephro  HTN: Well controlled with volume removal and resumption of his home medications. Will continue Amlodipine, Coreg, Clonidine and Losartan. -Holding hydral currently, consider resuming if pt  persistently hypertensive  COPD: Not on any inhalers at home nor is he on supplemental O2.   Polysubstance abuse: As above, will check HIV and HepC Abs  Dispo: Anticipated discharge today.  Jeremy Gergen, DO 08/05/2016, 11:42 AM Pager: [phone]

## 2016-08-05 NOTE — Care Management Note (Signed)
Case Management Note  Patient Details  Name: ERMON SAGAN MRN: [id number] Date of Birth: 02-03-1966  Subjective/Objective:                 Spoke with patient at the bedside. He states that he lives at home with his parents. He states that he drives, he denies using or needing DME at home, he describes himself as independent and manages his ADLs. Marland Kitchen He denies having difficulties obtaining his medications, other than narcotics for which he states the doctors have stopped prescribing due to his "addictive personality" which has led him to go and get them himself off the street.    Action/Plan:  No further CM needs identified at this time. CSW consult in place for resources.  Expected Discharge Date:  08/05/16               Expected Discharge Plan:  Home/Self Care  In-House Referral:     Discharge planning Services  CM Consult  Post Acute Care Choice:    Choice offered to:     DME Arranged:    DME Agency:     HH Arranged:    HH Agency:     Status of Service:  Completed, signed off  If discussed at [employer] of Stay Meetings, dates discussed:    Additional Comments:  Carles Collet, RN 08/05/2016, 12:21 PM

## 2016-08-05 NOTE — Progress Notes (Signed)
Discharge instructions given. Pt verbalized understanding and all questions were answered.  

## 2016-08-08 DIAGNOSIS — D509 Iron deficiency anemia, unspecified: Secondary | ICD-10-CM | POA: Diagnosis not present

## 2016-08-08 DIAGNOSIS — N2581 Secondary hyperparathyroidism of renal origin: Secondary | ICD-10-CM | POA: Diagnosis not present

## 2016-08-08 DIAGNOSIS — D631 Anemia in chronic kidney disease: Secondary | ICD-10-CM | POA: Diagnosis not present

## 2016-08-08 DIAGNOSIS — N186 End stage renal disease: Secondary | ICD-10-CM | POA: Diagnosis not present

## 2016-08-08 DIAGNOSIS — E875 Hyperkalemia: Secondary | ICD-10-CM | POA: Diagnosis not present

## 2016-08-10 DIAGNOSIS — E875 Hyperkalemia: Secondary | ICD-10-CM | POA: Diagnosis not present

## 2016-08-10 DIAGNOSIS — N2581 Secondary hyperparathyroidism of renal origin: Secondary | ICD-10-CM | POA: Diagnosis not present

## 2016-08-10 DIAGNOSIS — N186 End stage renal disease: Secondary | ICD-10-CM | POA: Diagnosis not present

## 2016-08-10 DIAGNOSIS — D631 Anemia in chronic kidney disease: Secondary | ICD-10-CM | POA: Diagnosis not present

## 2016-08-10 DIAGNOSIS — D509 Iron deficiency anemia, unspecified: Secondary | ICD-10-CM | POA: Diagnosis not present

## 2016-08-12 DIAGNOSIS — D509 Iron deficiency anemia, unspecified: Secondary | ICD-10-CM | POA: Diagnosis not present

## 2016-08-12 DIAGNOSIS — D631 Anemia in chronic kidney disease: Secondary | ICD-10-CM | POA: Diagnosis not present

## 2016-08-12 DIAGNOSIS — N186 End stage renal disease: Secondary | ICD-10-CM | POA: Diagnosis not present

## 2016-08-12 DIAGNOSIS — E875 Hyperkalemia: Secondary | ICD-10-CM | POA: Diagnosis not present

## 2016-08-12 DIAGNOSIS — N2581 Secondary hyperparathyroidism of renal origin: Secondary | ICD-10-CM | POA: Diagnosis not present

## 2016-08-13 DIAGNOSIS — I10 Essential (primary) hypertension: Secondary | ICD-10-CM | POA: Diagnosis not present

## 2016-08-13 DIAGNOSIS — N186 End stage renal disease: Secondary | ICD-10-CM | POA: Diagnosis not present

## 2016-08-13 DIAGNOSIS — Z992 Dependence on renal dialysis: Secondary | ICD-10-CM | POA: Diagnosis not present

## 2016-08-15 DIAGNOSIS — D509 Iron deficiency anemia, unspecified: Secondary | ICD-10-CM | POA: Diagnosis not present

## 2016-08-15 DIAGNOSIS — E875 Hyperkalemia: Secondary | ICD-10-CM | POA: Diagnosis not present

## 2016-08-15 DIAGNOSIS — N186 End stage renal disease: Secondary | ICD-10-CM | POA: Diagnosis not present

## 2016-08-15 DIAGNOSIS — D631 Anemia in chronic kidney disease: Secondary | ICD-10-CM | POA: Diagnosis not present

## 2016-08-15 DIAGNOSIS — N2581 Secondary hyperparathyroidism of renal origin: Secondary | ICD-10-CM | POA: Diagnosis not present

## 2016-08-17 DIAGNOSIS — D631 Anemia in chronic kidney disease: Secondary | ICD-10-CM | POA: Diagnosis not present

## 2016-08-17 DIAGNOSIS — E875 Hyperkalemia: Secondary | ICD-10-CM | POA: Diagnosis not present

## 2016-08-17 DIAGNOSIS — D509 Iron deficiency anemia, unspecified: Secondary | ICD-10-CM | POA: Diagnosis not present

## 2016-08-17 DIAGNOSIS — N2581 Secondary hyperparathyroidism of renal origin: Secondary | ICD-10-CM | POA: Diagnosis not present

## 2016-08-17 DIAGNOSIS — N186 End stage renal disease: Secondary | ICD-10-CM | POA: Diagnosis not present

## 2016-08-19 DIAGNOSIS — N186 End stage renal disease: Secondary | ICD-10-CM | POA: Diagnosis not present

## 2016-08-19 DIAGNOSIS — N2581 Secondary hyperparathyroidism of renal origin: Secondary | ICD-10-CM | POA: Diagnosis not present

## 2016-08-19 DIAGNOSIS — E875 Hyperkalemia: Secondary | ICD-10-CM | POA: Diagnosis not present

## 2016-08-19 DIAGNOSIS — D509 Iron deficiency anemia, unspecified: Secondary | ICD-10-CM | POA: Diagnosis not present

## 2016-08-19 DIAGNOSIS — D631 Anemia in chronic kidney disease: Secondary | ICD-10-CM | POA: Diagnosis not present

## 2016-08-22 DIAGNOSIS — N2581 Secondary hyperparathyroidism of renal origin: Secondary | ICD-10-CM | POA: Diagnosis not present

## 2016-08-22 DIAGNOSIS — D631 Anemia in chronic kidney disease: Secondary | ICD-10-CM | POA: Diagnosis not present

## 2016-08-22 DIAGNOSIS — N186 End stage renal disease: Secondary | ICD-10-CM | POA: Diagnosis not present

## 2016-08-22 DIAGNOSIS — D509 Iron deficiency anemia, unspecified: Secondary | ICD-10-CM | POA: Diagnosis not present

## 2016-08-22 DIAGNOSIS — E875 Hyperkalemia: Secondary | ICD-10-CM | POA: Diagnosis not present

## 2016-08-24 DIAGNOSIS — E875 Hyperkalemia: Secondary | ICD-10-CM | POA: Diagnosis not present

## 2016-08-24 DIAGNOSIS — N2581 Secondary hyperparathyroidism of renal origin: Secondary | ICD-10-CM | POA: Diagnosis not present

## 2016-08-24 DIAGNOSIS — D509 Iron deficiency anemia, unspecified: Secondary | ICD-10-CM | POA: Diagnosis not present

## 2016-08-24 DIAGNOSIS — D631 Anemia in chronic kidney disease: Secondary | ICD-10-CM | POA: Diagnosis not present

## 2016-08-24 DIAGNOSIS — N186 End stage renal disease: Secondary | ICD-10-CM | POA: Diagnosis not present

## 2016-08-26 DIAGNOSIS — N186 End stage renal disease: Secondary | ICD-10-CM | POA: Diagnosis not present

## 2016-08-26 DIAGNOSIS — E875 Hyperkalemia: Secondary | ICD-10-CM | POA: Diagnosis not present

## 2016-08-26 DIAGNOSIS — D509 Iron deficiency anemia, unspecified: Secondary | ICD-10-CM | POA: Diagnosis not present

## 2016-08-26 DIAGNOSIS — D631 Anemia in chronic kidney disease: Secondary | ICD-10-CM | POA: Diagnosis not present

## 2016-08-26 DIAGNOSIS — N2581 Secondary hyperparathyroidism of renal origin: Secondary | ICD-10-CM | POA: Diagnosis not present

## 2016-08-29 DIAGNOSIS — D631 Anemia in chronic kidney disease: Secondary | ICD-10-CM | POA: Diagnosis not present

## 2016-08-29 DIAGNOSIS — N186 End stage renal disease: Secondary | ICD-10-CM | POA: Diagnosis not present

## 2016-08-29 DIAGNOSIS — E875 Hyperkalemia: Secondary | ICD-10-CM | POA: Diagnosis not present

## 2016-08-29 DIAGNOSIS — D509 Iron deficiency anemia, unspecified: Secondary | ICD-10-CM | POA: Diagnosis not present

## 2016-08-29 DIAGNOSIS — N2581 Secondary hyperparathyroidism of renal origin: Secondary | ICD-10-CM | POA: Diagnosis not present

## 2016-08-31 DIAGNOSIS — N186 End stage renal disease: Secondary | ICD-10-CM | POA: Diagnosis not present

## 2016-08-31 DIAGNOSIS — N2581 Secondary hyperparathyroidism of renal origin: Secondary | ICD-10-CM | POA: Diagnosis not present

## 2016-08-31 DIAGNOSIS — D509 Iron deficiency anemia, unspecified: Secondary | ICD-10-CM | POA: Diagnosis not present

## 2016-08-31 DIAGNOSIS — D631 Anemia in chronic kidney disease: Secondary | ICD-10-CM | POA: Diagnosis not present

## 2016-08-31 DIAGNOSIS — E875 Hyperkalemia: Secondary | ICD-10-CM | POA: Diagnosis not present

## 2016-09-01 DIAGNOSIS — D631 Anemia in chronic kidney disease: Secondary | ICD-10-CM | POA: Diagnosis not present

## 2016-09-01 DIAGNOSIS — E875 Hyperkalemia: Secondary | ICD-10-CM | POA: Diagnosis not present

## 2016-09-01 DIAGNOSIS — N186 End stage renal disease: Secondary | ICD-10-CM | POA: Diagnosis not present

## 2016-09-01 DIAGNOSIS — D509 Iron deficiency anemia, unspecified: Secondary | ICD-10-CM | POA: Diagnosis not present

## 2016-09-01 DIAGNOSIS — N2581 Secondary hyperparathyroidism of renal origin: Secondary | ICD-10-CM | POA: Diagnosis not present

## 2016-09-05 DIAGNOSIS — D509 Iron deficiency anemia, unspecified: Secondary | ICD-10-CM | POA: Diagnosis not present

## 2016-09-05 DIAGNOSIS — D631 Anemia in chronic kidney disease: Secondary | ICD-10-CM | POA: Diagnosis not present

## 2016-09-05 DIAGNOSIS — N2581 Secondary hyperparathyroidism of renal origin: Secondary | ICD-10-CM | POA: Diagnosis not present

## 2016-09-05 DIAGNOSIS — E875 Hyperkalemia: Secondary | ICD-10-CM | POA: Diagnosis not present

## 2016-09-05 DIAGNOSIS — N186 End stage renal disease: Secondary | ICD-10-CM | POA: Diagnosis not present

## 2016-09-07 DIAGNOSIS — N2581 Secondary hyperparathyroidism of renal origin: Secondary | ICD-10-CM | POA: Diagnosis not present

## 2016-09-07 DIAGNOSIS — E875 Hyperkalemia: Secondary | ICD-10-CM | POA: Diagnosis not present

## 2016-09-07 DIAGNOSIS — D631 Anemia in chronic kidney disease: Secondary | ICD-10-CM | POA: Diagnosis not present

## 2016-09-07 DIAGNOSIS — N186 End stage renal disease: Secondary | ICD-10-CM | POA: Diagnosis not present

## 2016-09-07 DIAGNOSIS — D509 Iron deficiency anemia, unspecified: Secondary | ICD-10-CM | POA: Diagnosis not present

## 2016-09-09 DIAGNOSIS — N186 End stage renal disease: Secondary | ICD-10-CM | POA: Diagnosis not present

## 2016-09-09 DIAGNOSIS — D631 Anemia in chronic kidney disease: Secondary | ICD-10-CM | POA: Diagnosis not present

## 2016-09-09 DIAGNOSIS — N2581 Secondary hyperparathyroidism of renal origin: Secondary | ICD-10-CM | POA: Diagnosis not present

## 2016-09-09 DIAGNOSIS — E875 Hyperkalemia: Secondary | ICD-10-CM | POA: Diagnosis not present

## 2016-09-09 DIAGNOSIS — D509 Iron deficiency anemia, unspecified: Secondary | ICD-10-CM | POA: Diagnosis not present

## 2016-09-12 DIAGNOSIS — D509 Iron deficiency anemia, unspecified: Secondary | ICD-10-CM | POA: Diagnosis not present

## 2016-09-12 DIAGNOSIS — N186 End stage renal disease: Secondary | ICD-10-CM | POA: Diagnosis not present

## 2016-09-12 DIAGNOSIS — Z992 Dependence on renal dialysis: Secondary | ICD-10-CM | POA: Diagnosis not present

## 2016-09-12 DIAGNOSIS — I10 Essential (primary) hypertension: Secondary | ICD-10-CM | POA: Diagnosis not present

## 2016-09-12 DIAGNOSIS — D631 Anemia in chronic kidney disease: Secondary | ICD-10-CM | POA: Diagnosis not present

## 2016-09-12 DIAGNOSIS — N2581 Secondary hyperparathyroidism of renal origin: Secondary | ICD-10-CM | POA: Diagnosis not present

## 2016-09-12 DIAGNOSIS — E875 Hyperkalemia: Secondary | ICD-10-CM | POA: Diagnosis not present

## 2016-09-14 DIAGNOSIS — D509 Iron deficiency anemia, unspecified: Secondary | ICD-10-CM | POA: Diagnosis not present

## 2016-09-14 DIAGNOSIS — N2581 Secondary hyperparathyroidism of renal origin: Secondary | ICD-10-CM | POA: Diagnosis not present

## 2016-09-14 DIAGNOSIS — N186 End stage renal disease: Secondary | ICD-10-CM | POA: Diagnosis not present

## 2016-09-14 DIAGNOSIS — D631 Anemia in chronic kidney disease: Secondary | ICD-10-CM | POA: Diagnosis not present

## 2016-09-14 DIAGNOSIS — E875 Hyperkalemia: Secondary | ICD-10-CM | POA: Diagnosis not present

## 2016-09-16 DIAGNOSIS — D509 Iron deficiency anemia, unspecified: Secondary | ICD-10-CM | POA: Diagnosis not present

## 2016-09-16 DIAGNOSIS — N2581 Secondary hyperparathyroidism of renal origin: Secondary | ICD-10-CM | POA: Diagnosis not present

## 2016-09-16 DIAGNOSIS — D631 Anemia in chronic kidney disease: Secondary | ICD-10-CM | POA: Diagnosis not present

## 2016-09-16 DIAGNOSIS — E875 Hyperkalemia: Secondary | ICD-10-CM | POA: Diagnosis not present

## 2016-09-16 DIAGNOSIS — N186 End stage renal disease: Secondary | ICD-10-CM | POA: Diagnosis not present

## 2016-09-19 DIAGNOSIS — D509 Iron deficiency anemia, unspecified: Secondary | ICD-10-CM | POA: Diagnosis not present

## 2016-09-19 DIAGNOSIS — D631 Anemia in chronic kidney disease: Secondary | ICD-10-CM | POA: Diagnosis not present

## 2016-09-19 DIAGNOSIS — N2581 Secondary hyperparathyroidism of renal origin: Secondary | ICD-10-CM | POA: Diagnosis not present

## 2016-09-19 DIAGNOSIS — N186 End stage renal disease: Secondary | ICD-10-CM | POA: Diagnosis not present

## 2016-09-19 DIAGNOSIS — E875 Hyperkalemia: Secondary | ICD-10-CM | POA: Diagnosis not present

## 2016-09-21 DIAGNOSIS — N2581 Secondary hyperparathyroidism of renal origin: Secondary | ICD-10-CM | POA: Diagnosis not present

## 2016-09-21 DIAGNOSIS — D631 Anemia in chronic kidney disease: Secondary | ICD-10-CM | POA: Diagnosis not present

## 2016-09-21 DIAGNOSIS — D509 Iron deficiency anemia, unspecified: Secondary | ICD-10-CM | POA: Diagnosis not present

## 2016-09-21 DIAGNOSIS — E875 Hyperkalemia: Secondary | ICD-10-CM | POA: Diagnosis not present

## 2016-09-21 DIAGNOSIS — N186 End stage renal disease: Secondary | ICD-10-CM | POA: Diagnosis not present

## 2016-09-23 DIAGNOSIS — D509 Iron deficiency anemia, unspecified: Secondary | ICD-10-CM | POA: Diagnosis not present

## 2016-09-23 DIAGNOSIS — N186 End stage renal disease: Secondary | ICD-10-CM | POA: Diagnosis not present

## 2016-09-23 DIAGNOSIS — D631 Anemia in chronic kidney disease: Secondary | ICD-10-CM | POA: Diagnosis not present

## 2016-09-23 DIAGNOSIS — N2581 Secondary hyperparathyroidism of renal origin: Secondary | ICD-10-CM | POA: Diagnosis not present

## 2016-09-23 DIAGNOSIS — E875 Hyperkalemia: Secondary | ICD-10-CM | POA: Diagnosis not present

## 2016-09-26 DIAGNOSIS — E875 Hyperkalemia: Secondary | ICD-10-CM | POA: Diagnosis not present

## 2016-09-26 DIAGNOSIS — D631 Anemia in chronic kidney disease: Secondary | ICD-10-CM | POA: Diagnosis not present

## 2016-09-26 DIAGNOSIS — D509 Iron deficiency anemia, unspecified: Secondary | ICD-10-CM | POA: Diagnosis not present

## 2016-09-26 DIAGNOSIS — N2581 Secondary hyperparathyroidism of renal origin: Secondary | ICD-10-CM | POA: Diagnosis not present

## 2016-09-26 DIAGNOSIS — N186 End stage renal disease: Secondary | ICD-10-CM | POA: Diagnosis not present

## 2016-09-28 DIAGNOSIS — D631 Anemia in chronic kidney disease: Secondary | ICD-10-CM | POA: Diagnosis not present

## 2016-09-28 DIAGNOSIS — N2581 Secondary hyperparathyroidism of renal origin: Secondary | ICD-10-CM | POA: Diagnosis not present

## 2016-09-28 DIAGNOSIS — E875 Hyperkalemia: Secondary | ICD-10-CM | POA: Diagnosis not present

## 2016-09-28 DIAGNOSIS — D509 Iron deficiency anemia, unspecified: Secondary | ICD-10-CM | POA: Diagnosis not present

## 2016-09-28 DIAGNOSIS — N186 End stage renal disease: Secondary | ICD-10-CM | POA: Diagnosis not present

## 2016-09-30 DIAGNOSIS — N186 End stage renal disease: Secondary | ICD-10-CM | POA: Diagnosis not present

## 2016-09-30 DIAGNOSIS — N2581 Secondary hyperparathyroidism of renal origin: Secondary | ICD-10-CM | POA: Diagnosis not present

## 2016-09-30 DIAGNOSIS — D631 Anemia in chronic kidney disease: Secondary | ICD-10-CM | POA: Diagnosis not present

## 2016-09-30 DIAGNOSIS — D509 Iron deficiency anemia, unspecified: Secondary | ICD-10-CM | POA: Diagnosis not present

## 2016-09-30 DIAGNOSIS — E875 Hyperkalemia: Secondary | ICD-10-CM | POA: Diagnosis not present

## 2016-10-03 DIAGNOSIS — D631 Anemia in chronic kidney disease: Secondary | ICD-10-CM | POA: Diagnosis not present

## 2016-10-03 DIAGNOSIS — N2581 Secondary hyperparathyroidism of renal origin: Secondary | ICD-10-CM | POA: Diagnosis not present

## 2016-10-03 DIAGNOSIS — N186 End stage renal disease: Secondary | ICD-10-CM | POA: Diagnosis not present

## 2016-10-03 DIAGNOSIS — D509 Iron deficiency anemia, unspecified: Secondary | ICD-10-CM | POA: Diagnosis not present

## 2016-10-03 DIAGNOSIS — E875 Hyperkalemia: Secondary | ICD-10-CM | POA: Diagnosis not present

## 2016-10-05 DIAGNOSIS — N186 End stage renal disease: Secondary | ICD-10-CM | POA: Diagnosis not present

## 2016-10-05 DIAGNOSIS — D509 Iron deficiency anemia, unspecified: Secondary | ICD-10-CM | POA: Diagnosis not present

## 2016-10-05 DIAGNOSIS — D631 Anemia in chronic kidney disease: Secondary | ICD-10-CM | POA: Diagnosis not present

## 2016-10-05 DIAGNOSIS — N2581 Secondary hyperparathyroidism of renal origin: Secondary | ICD-10-CM | POA: Diagnosis not present

## 2016-10-05 DIAGNOSIS — R509 Fever, unspecified: Secondary | ICD-10-CM | POA: Insufficient documentation

## 2016-10-05 DIAGNOSIS — E875 Hyperkalemia: Secondary | ICD-10-CM | POA: Diagnosis not present

## 2016-10-07 DIAGNOSIS — D509 Iron deficiency anemia, unspecified: Secondary | ICD-10-CM | POA: Diagnosis not present

## 2016-10-07 DIAGNOSIS — E875 Hyperkalemia: Secondary | ICD-10-CM | POA: Diagnosis not present

## 2016-10-07 DIAGNOSIS — N186 End stage renal disease: Secondary | ICD-10-CM | POA: Diagnosis not present

## 2016-10-07 DIAGNOSIS — D631 Anemia in chronic kidney disease: Secondary | ICD-10-CM | POA: Diagnosis not present

## 2016-10-07 DIAGNOSIS — N2581 Secondary hyperparathyroidism of renal origin: Secondary | ICD-10-CM | POA: Diagnosis not present

## 2016-10-10 DIAGNOSIS — D631 Anemia in chronic kidney disease: Secondary | ICD-10-CM | POA: Diagnosis not present

## 2016-10-10 DIAGNOSIS — N186 End stage renal disease: Secondary | ICD-10-CM | POA: Diagnosis not present

## 2016-10-10 DIAGNOSIS — E875 Hyperkalemia: Secondary | ICD-10-CM | POA: Diagnosis not present

## 2016-10-10 DIAGNOSIS — N2581 Secondary hyperparathyroidism of renal origin: Secondary | ICD-10-CM | POA: Diagnosis not present

## 2016-10-10 DIAGNOSIS — D509 Iron deficiency anemia, unspecified: Secondary | ICD-10-CM | POA: Diagnosis not present

## 2016-10-12 DIAGNOSIS — E875 Hyperkalemia: Secondary | ICD-10-CM | POA: Diagnosis not present

## 2016-10-12 DIAGNOSIS — D509 Iron deficiency anemia, unspecified: Secondary | ICD-10-CM | POA: Diagnosis not present

## 2016-10-12 DIAGNOSIS — D631 Anemia in chronic kidney disease: Secondary | ICD-10-CM | POA: Diagnosis not present

## 2016-10-12 DIAGNOSIS — N2581 Secondary hyperparathyroidism of renal origin: Secondary | ICD-10-CM | POA: Diagnosis not present

## 2016-10-12 DIAGNOSIS — N186 End stage renal disease: Secondary | ICD-10-CM | POA: Diagnosis not present

## 2016-10-13 DIAGNOSIS — I10 Essential (primary) hypertension: Secondary | ICD-10-CM | POA: Diagnosis not present

## 2016-10-13 DIAGNOSIS — Z992 Dependence on renal dialysis: Secondary | ICD-10-CM | POA: Diagnosis not present

## 2016-10-13 DIAGNOSIS — N186 End stage renal disease: Secondary | ICD-10-CM | POA: Diagnosis not present

## 2016-10-14 DIAGNOSIS — E875 Hyperkalemia: Secondary | ICD-10-CM | POA: Diagnosis not present

## 2016-10-14 DIAGNOSIS — N186 End stage renal disease: Secondary | ICD-10-CM | POA: Diagnosis not present

## 2016-10-14 DIAGNOSIS — D509 Iron deficiency anemia, unspecified: Secondary | ICD-10-CM | POA: Diagnosis not present

## 2016-10-14 DIAGNOSIS — N2581 Secondary hyperparathyroidism of renal origin: Secondary | ICD-10-CM | POA: Diagnosis not present

## 2016-10-17 DIAGNOSIS — E875 Hyperkalemia: Secondary | ICD-10-CM | POA: Diagnosis not present

## 2016-10-17 DIAGNOSIS — N186 End stage renal disease: Secondary | ICD-10-CM | POA: Diagnosis not present

## 2016-10-17 DIAGNOSIS — D509 Iron deficiency anemia, unspecified: Secondary | ICD-10-CM | POA: Diagnosis not present

## 2016-10-17 DIAGNOSIS — N2581 Secondary hyperparathyroidism of renal origin: Secondary | ICD-10-CM | POA: Diagnosis not present

## 2016-10-19 DIAGNOSIS — E875 Hyperkalemia: Secondary | ICD-10-CM | POA: Diagnosis not present

## 2016-10-19 DIAGNOSIS — N186 End stage renal disease: Secondary | ICD-10-CM | POA: Diagnosis not present

## 2016-10-19 DIAGNOSIS — D509 Iron deficiency anemia, unspecified: Secondary | ICD-10-CM | POA: Diagnosis not present

## 2016-10-19 DIAGNOSIS — N2581 Secondary hyperparathyroidism of renal origin: Secondary | ICD-10-CM | POA: Diagnosis not present

## 2016-10-21 DIAGNOSIS — D509 Iron deficiency anemia, unspecified: Secondary | ICD-10-CM | POA: Diagnosis not present

## 2016-10-21 DIAGNOSIS — N2581 Secondary hyperparathyroidism of renal origin: Secondary | ICD-10-CM | POA: Diagnosis not present

## 2016-10-21 DIAGNOSIS — E875 Hyperkalemia: Secondary | ICD-10-CM | POA: Diagnosis not present

## 2016-10-21 DIAGNOSIS — N186 End stage renal disease: Secondary | ICD-10-CM | POA: Diagnosis not present

## 2016-10-24 DIAGNOSIS — N2581 Secondary hyperparathyroidism of renal origin: Secondary | ICD-10-CM | POA: Diagnosis not present

## 2016-10-24 DIAGNOSIS — E875 Hyperkalemia: Secondary | ICD-10-CM | POA: Diagnosis not present

## 2016-10-24 DIAGNOSIS — D509 Iron deficiency anemia, unspecified: Secondary | ICD-10-CM | POA: Diagnosis not present

## 2016-10-24 DIAGNOSIS — N186 End stage renal disease: Secondary | ICD-10-CM | POA: Diagnosis not present

## 2016-10-26 DIAGNOSIS — E875 Hyperkalemia: Secondary | ICD-10-CM | POA: Diagnosis not present

## 2016-10-26 DIAGNOSIS — D509 Iron deficiency anemia, unspecified: Secondary | ICD-10-CM | POA: Diagnosis not present

## 2016-10-26 DIAGNOSIS — N2581 Secondary hyperparathyroidism of renal origin: Secondary | ICD-10-CM | POA: Diagnosis not present

## 2016-10-26 DIAGNOSIS — N186 End stage renal disease: Secondary | ICD-10-CM | POA: Diagnosis not present

## 2016-10-28 DIAGNOSIS — D509 Iron deficiency anemia, unspecified: Secondary | ICD-10-CM | POA: Diagnosis not present

## 2016-10-28 DIAGNOSIS — E875 Hyperkalemia: Secondary | ICD-10-CM | POA: Diagnosis not present

## 2016-10-28 DIAGNOSIS — N186 End stage renal disease: Secondary | ICD-10-CM | POA: Diagnosis not present

## 2016-10-28 DIAGNOSIS — N2581 Secondary hyperparathyroidism of renal origin: Secondary | ICD-10-CM | POA: Diagnosis not present

## 2016-10-31 DIAGNOSIS — N2581 Secondary hyperparathyroidism of renal origin: Secondary | ICD-10-CM | POA: Diagnosis not present

## 2016-10-31 DIAGNOSIS — E875 Hyperkalemia: Secondary | ICD-10-CM | POA: Diagnosis not present

## 2016-10-31 DIAGNOSIS — N186 End stage renal disease: Secondary | ICD-10-CM | POA: Diagnosis not present

## 2016-10-31 DIAGNOSIS — D509 Iron deficiency anemia, unspecified: Secondary | ICD-10-CM | POA: Diagnosis not present

## 2016-11-02 DIAGNOSIS — N2581 Secondary hyperparathyroidism of renal origin: Secondary | ICD-10-CM | POA: Diagnosis not present

## 2016-11-02 DIAGNOSIS — N186 End stage renal disease: Secondary | ICD-10-CM | POA: Diagnosis not present

## 2016-11-02 DIAGNOSIS — E875 Hyperkalemia: Secondary | ICD-10-CM | POA: Diagnosis not present

## 2016-11-02 DIAGNOSIS — D509 Iron deficiency anemia, unspecified: Secondary | ICD-10-CM | POA: Diagnosis not present

## 2016-11-04 DIAGNOSIS — D509 Iron deficiency anemia, unspecified: Secondary | ICD-10-CM | POA: Diagnosis not present

## 2016-11-04 DIAGNOSIS — N2581 Secondary hyperparathyroidism of renal origin: Secondary | ICD-10-CM | POA: Diagnosis not present

## 2016-11-04 DIAGNOSIS — N186 End stage renal disease: Secondary | ICD-10-CM | POA: Diagnosis not present

## 2016-11-04 DIAGNOSIS — E875 Hyperkalemia: Secondary | ICD-10-CM | POA: Diagnosis not present

## 2016-11-07 DIAGNOSIS — E875 Hyperkalemia: Secondary | ICD-10-CM | POA: Diagnosis not present

## 2016-11-07 DIAGNOSIS — N186 End stage renal disease: Secondary | ICD-10-CM | POA: Diagnosis not present

## 2016-11-07 DIAGNOSIS — D509 Iron deficiency anemia, unspecified: Secondary | ICD-10-CM | POA: Diagnosis not present

## 2016-11-07 DIAGNOSIS — N2581 Secondary hyperparathyroidism of renal origin: Secondary | ICD-10-CM | POA: Diagnosis not present

## 2016-11-09 DIAGNOSIS — N186 End stage renal disease: Secondary | ICD-10-CM | POA: Diagnosis not present

## 2016-11-09 DIAGNOSIS — N2581 Secondary hyperparathyroidism of renal origin: Secondary | ICD-10-CM | POA: Diagnosis not present

## 2016-11-09 DIAGNOSIS — D509 Iron deficiency anemia, unspecified: Secondary | ICD-10-CM | POA: Diagnosis not present

## 2016-11-09 DIAGNOSIS — E875 Hyperkalemia: Secondary | ICD-10-CM | POA: Diagnosis not present

## 2016-11-11 DIAGNOSIS — D509 Iron deficiency anemia, unspecified: Secondary | ICD-10-CM | POA: Diagnosis not present

## 2016-11-11 DIAGNOSIS — N186 End stage renal disease: Secondary | ICD-10-CM | POA: Diagnosis not present

## 2016-11-11 DIAGNOSIS — N2581 Secondary hyperparathyroidism of renal origin: Secondary | ICD-10-CM | POA: Diagnosis not present

## 2016-11-11 DIAGNOSIS — E875 Hyperkalemia: Secondary | ICD-10-CM | POA: Diagnosis not present

## 2016-11-12 DIAGNOSIS — I10 Essential (primary) hypertension: Secondary | ICD-10-CM | POA: Diagnosis not present

## 2016-11-12 DIAGNOSIS — Z992 Dependence on renal dialysis: Secondary | ICD-10-CM | POA: Diagnosis not present

## 2016-11-12 DIAGNOSIS — N186 End stage renal disease: Secondary | ICD-10-CM | POA: Diagnosis not present

## 2016-11-14 DIAGNOSIS — N186 End stage renal disease: Secondary | ICD-10-CM | POA: Diagnosis not present

## 2016-11-14 DIAGNOSIS — D631 Anemia in chronic kidney disease: Secondary | ICD-10-CM | POA: Diagnosis not present

## 2016-11-14 DIAGNOSIS — E875 Hyperkalemia: Secondary | ICD-10-CM | POA: Diagnosis not present

## 2016-11-14 DIAGNOSIS — N2581 Secondary hyperparathyroidism of renal origin: Secondary | ICD-10-CM | POA: Diagnosis not present

## 2016-11-18 DIAGNOSIS — D631 Anemia in chronic kidney disease: Secondary | ICD-10-CM | POA: Diagnosis not present

## 2016-11-18 DIAGNOSIS — E875 Hyperkalemia: Secondary | ICD-10-CM | POA: Diagnosis not present

## 2016-11-18 DIAGNOSIS — N186 End stage renal disease: Secondary | ICD-10-CM | POA: Diagnosis not present

## 2016-11-18 DIAGNOSIS — N2581 Secondary hyperparathyroidism of renal origin: Secondary | ICD-10-CM | POA: Diagnosis not present

## 2016-11-23 DIAGNOSIS — E875 Hyperkalemia: Secondary | ICD-10-CM | POA: Diagnosis not present

## 2016-11-23 DIAGNOSIS — N2581 Secondary hyperparathyroidism of renal origin: Secondary | ICD-10-CM | POA: Diagnosis not present

## 2016-11-23 DIAGNOSIS — N186 End stage renal disease: Secondary | ICD-10-CM | POA: Diagnosis not present

## 2016-11-23 DIAGNOSIS — D631 Anemia in chronic kidney disease: Secondary | ICD-10-CM | POA: Diagnosis not present

## 2016-11-25 DIAGNOSIS — N186 End stage renal disease: Secondary | ICD-10-CM | POA: Diagnosis not present

## 2016-11-25 DIAGNOSIS — D631 Anemia in chronic kidney disease: Secondary | ICD-10-CM | POA: Diagnosis not present

## 2016-11-25 DIAGNOSIS — E875 Hyperkalemia: Secondary | ICD-10-CM | POA: Diagnosis not present

## 2016-11-25 DIAGNOSIS — N2581 Secondary hyperparathyroidism of renal origin: Secondary | ICD-10-CM | POA: Diagnosis not present

## 2016-11-28 DIAGNOSIS — N186 End stage renal disease: Secondary | ICD-10-CM | POA: Diagnosis not present

## 2016-11-28 DIAGNOSIS — N2581 Secondary hyperparathyroidism of renal origin: Secondary | ICD-10-CM | POA: Diagnosis not present

## 2016-11-28 DIAGNOSIS — E875 Hyperkalemia: Secondary | ICD-10-CM | POA: Diagnosis not present

## 2016-11-28 DIAGNOSIS — D631 Anemia in chronic kidney disease: Secondary | ICD-10-CM | POA: Diagnosis not present

## 2016-11-30 DIAGNOSIS — N2581 Secondary hyperparathyroidism of renal origin: Secondary | ICD-10-CM | POA: Diagnosis not present

## 2016-11-30 DIAGNOSIS — N186 End stage renal disease: Secondary | ICD-10-CM | POA: Diagnosis not present

## 2016-11-30 DIAGNOSIS — E875 Hyperkalemia: Secondary | ICD-10-CM | POA: Diagnosis not present

## 2016-11-30 DIAGNOSIS — D631 Anemia in chronic kidney disease: Secondary | ICD-10-CM | POA: Diagnosis not present

## 2016-12-02 DIAGNOSIS — N186 End stage renal disease: Secondary | ICD-10-CM | POA: Diagnosis not present

## 2016-12-02 DIAGNOSIS — N2581 Secondary hyperparathyroidism of renal origin: Secondary | ICD-10-CM | POA: Diagnosis not present

## 2016-12-02 DIAGNOSIS — E875 Hyperkalemia: Secondary | ICD-10-CM | POA: Diagnosis not present

## 2016-12-02 DIAGNOSIS — D631 Anemia in chronic kidney disease: Secondary | ICD-10-CM | POA: Diagnosis not present

## 2016-12-05 DIAGNOSIS — D631 Anemia in chronic kidney disease: Secondary | ICD-10-CM | POA: Diagnosis not present

## 2016-12-05 DIAGNOSIS — N186 End stage renal disease: Secondary | ICD-10-CM | POA: Diagnosis not present

## 2016-12-05 DIAGNOSIS — N2581 Secondary hyperparathyroidism of renal origin: Secondary | ICD-10-CM | POA: Diagnosis not present

## 2016-12-05 DIAGNOSIS — E875 Hyperkalemia: Secondary | ICD-10-CM | POA: Diagnosis not present

## 2016-12-07 DIAGNOSIS — N186 End stage renal disease: Secondary | ICD-10-CM | POA: Diagnosis not present

## 2016-12-07 DIAGNOSIS — N2581 Secondary hyperparathyroidism of renal origin: Secondary | ICD-10-CM | POA: Diagnosis not present

## 2016-12-07 DIAGNOSIS — E875 Hyperkalemia: Secondary | ICD-10-CM | POA: Diagnosis not present

## 2016-12-07 DIAGNOSIS — D631 Anemia in chronic kidney disease: Secondary | ICD-10-CM | POA: Diagnosis not present

## 2016-12-09 DIAGNOSIS — N186 End stage renal disease: Secondary | ICD-10-CM | POA: Diagnosis not present

## 2016-12-09 DIAGNOSIS — N2581 Secondary hyperparathyroidism of renal origin: Secondary | ICD-10-CM | POA: Diagnosis not present

## 2016-12-09 DIAGNOSIS — D631 Anemia in chronic kidney disease: Secondary | ICD-10-CM | POA: Diagnosis not present

## 2016-12-09 DIAGNOSIS — E875 Hyperkalemia: Secondary | ICD-10-CM | POA: Diagnosis not present

## 2016-12-12 DIAGNOSIS — E875 Hyperkalemia: Secondary | ICD-10-CM | POA: Diagnosis not present

## 2016-12-12 DIAGNOSIS — N2581 Secondary hyperparathyroidism of renal origin: Secondary | ICD-10-CM | POA: Diagnosis not present

## 2016-12-12 DIAGNOSIS — N186 End stage renal disease: Secondary | ICD-10-CM | POA: Diagnosis not present

## 2016-12-12 DIAGNOSIS — D631 Anemia in chronic kidney disease: Secondary | ICD-10-CM | POA: Diagnosis not present

## 2016-12-13 DIAGNOSIS — N186 End stage renal disease: Secondary | ICD-10-CM | POA: Diagnosis not present

## 2016-12-13 DIAGNOSIS — I10 Essential (primary) hypertension: Secondary | ICD-10-CM | POA: Diagnosis not present

## 2016-12-13 DIAGNOSIS — Z992 Dependence on renal dialysis: Secondary | ICD-10-CM | POA: Diagnosis not present

## 2016-12-14 DIAGNOSIS — D631 Anemia in chronic kidney disease: Secondary | ICD-10-CM | POA: Diagnosis not present

## 2016-12-14 DIAGNOSIS — N186 End stage renal disease: Secondary | ICD-10-CM | POA: Diagnosis not present

## 2016-12-14 DIAGNOSIS — D509 Iron deficiency anemia, unspecified: Secondary | ICD-10-CM | POA: Diagnosis not present

## 2016-12-14 DIAGNOSIS — N2581 Secondary hyperparathyroidism of renal origin: Secondary | ICD-10-CM | POA: Diagnosis not present

## 2016-12-14 DIAGNOSIS — E875 Hyperkalemia: Secondary | ICD-10-CM | POA: Diagnosis not present

## 2016-12-19 DIAGNOSIS — N186 End stage renal disease: Secondary | ICD-10-CM | POA: Diagnosis not present

## 2016-12-19 DIAGNOSIS — E875 Hyperkalemia: Secondary | ICD-10-CM | POA: Diagnosis not present

## 2016-12-19 DIAGNOSIS — D509 Iron deficiency anemia, unspecified: Secondary | ICD-10-CM | POA: Diagnosis not present

## 2016-12-19 DIAGNOSIS — N2581 Secondary hyperparathyroidism of renal origin: Secondary | ICD-10-CM | POA: Diagnosis not present

## 2016-12-19 DIAGNOSIS — D631 Anemia in chronic kidney disease: Secondary | ICD-10-CM | POA: Diagnosis not present

## 2016-12-21 DIAGNOSIS — D509 Iron deficiency anemia, unspecified: Secondary | ICD-10-CM | POA: Diagnosis not present

## 2016-12-21 DIAGNOSIS — N2581 Secondary hyperparathyroidism of renal origin: Secondary | ICD-10-CM | POA: Diagnosis not present

## 2016-12-21 DIAGNOSIS — D631 Anemia in chronic kidney disease: Secondary | ICD-10-CM | POA: Diagnosis not present

## 2016-12-21 DIAGNOSIS — N186 End stage renal disease: Secondary | ICD-10-CM | POA: Diagnosis not present

## 2016-12-21 DIAGNOSIS — E875 Hyperkalemia: Secondary | ICD-10-CM | POA: Diagnosis not present

## 2016-12-23 DIAGNOSIS — N2581 Secondary hyperparathyroidism of renal origin: Secondary | ICD-10-CM | POA: Diagnosis not present

## 2016-12-23 DIAGNOSIS — D509 Iron deficiency anemia, unspecified: Secondary | ICD-10-CM | POA: Diagnosis not present

## 2016-12-23 DIAGNOSIS — N186 End stage renal disease: Secondary | ICD-10-CM | POA: Diagnosis not present

## 2016-12-23 DIAGNOSIS — D631 Anemia in chronic kidney disease: Secondary | ICD-10-CM | POA: Diagnosis not present

## 2016-12-23 DIAGNOSIS — E875 Hyperkalemia: Secondary | ICD-10-CM | POA: Diagnosis not present

## 2016-12-26 DIAGNOSIS — D631 Anemia in chronic kidney disease: Secondary | ICD-10-CM | POA: Diagnosis not present

## 2016-12-26 DIAGNOSIS — N186 End stage renal disease: Secondary | ICD-10-CM | POA: Diagnosis not present

## 2016-12-26 DIAGNOSIS — N2581 Secondary hyperparathyroidism of renal origin: Secondary | ICD-10-CM | POA: Diagnosis not present

## 2016-12-26 DIAGNOSIS — E875 Hyperkalemia: Secondary | ICD-10-CM | POA: Diagnosis not present

## 2016-12-26 DIAGNOSIS — D509 Iron deficiency anemia, unspecified: Secondary | ICD-10-CM | POA: Diagnosis not present

## 2016-12-28 DIAGNOSIS — D631 Anemia in chronic kidney disease: Secondary | ICD-10-CM | POA: Diagnosis not present

## 2016-12-28 DIAGNOSIS — N186 End stage renal disease: Secondary | ICD-10-CM | POA: Diagnosis not present

## 2016-12-28 DIAGNOSIS — D509 Iron deficiency anemia, unspecified: Secondary | ICD-10-CM | POA: Diagnosis not present

## 2016-12-28 DIAGNOSIS — E875 Hyperkalemia: Secondary | ICD-10-CM | POA: Diagnosis not present

## 2016-12-28 DIAGNOSIS — N2581 Secondary hyperparathyroidism of renal origin: Secondary | ICD-10-CM | POA: Diagnosis not present

## 2016-12-30 DIAGNOSIS — D509 Iron deficiency anemia, unspecified: Secondary | ICD-10-CM | POA: Diagnosis not present

## 2016-12-30 DIAGNOSIS — D631 Anemia in chronic kidney disease: Secondary | ICD-10-CM | POA: Diagnosis not present

## 2016-12-30 DIAGNOSIS — E875 Hyperkalemia: Secondary | ICD-10-CM | POA: Diagnosis not present

## 2016-12-30 DIAGNOSIS — N2581 Secondary hyperparathyroidism of renal origin: Secondary | ICD-10-CM | POA: Diagnosis not present

## 2016-12-30 DIAGNOSIS — N186 End stage renal disease: Secondary | ICD-10-CM | POA: Diagnosis not present

## 2017-01-02 DIAGNOSIS — D509 Iron deficiency anemia, unspecified: Secondary | ICD-10-CM | POA: Diagnosis not present

## 2017-01-02 DIAGNOSIS — N186 End stage renal disease: Secondary | ICD-10-CM | POA: Diagnosis not present

## 2017-01-02 DIAGNOSIS — N2581 Secondary hyperparathyroidism of renal origin: Secondary | ICD-10-CM | POA: Diagnosis not present

## 2017-01-02 DIAGNOSIS — D631 Anemia in chronic kidney disease: Secondary | ICD-10-CM | POA: Diagnosis not present

## 2017-01-02 DIAGNOSIS — E875 Hyperkalemia: Secondary | ICD-10-CM | POA: Diagnosis not present

## 2017-01-04 DIAGNOSIS — N186 End stage renal disease: Secondary | ICD-10-CM | POA: Diagnosis not present

## 2017-01-04 DIAGNOSIS — D509 Iron deficiency anemia, unspecified: Secondary | ICD-10-CM | POA: Diagnosis not present

## 2017-01-04 DIAGNOSIS — N2581 Secondary hyperparathyroidism of renal origin: Secondary | ICD-10-CM | POA: Diagnosis not present

## 2017-01-04 DIAGNOSIS — D631 Anemia in chronic kidney disease: Secondary | ICD-10-CM | POA: Diagnosis not present

## 2017-01-04 DIAGNOSIS — E875 Hyperkalemia: Secondary | ICD-10-CM | POA: Diagnosis not present

## 2017-01-06 DIAGNOSIS — N186 End stage renal disease: Secondary | ICD-10-CM | POA: Diagnosis not present

## 2017-01-06 DIAGNOSIS — D509 Iron deficiency anemia, unspecified: Secondary | ICD-10-CM | POA: Diagnosis not present

## 2017-01-06 DIAGNOSIS — D631 Anemia in chronic kidney disease: Secondary | ICD-10-CM | POA: Diagnosis not present

## 2017-01-06 DIAGNOSIS — E875 Hyperkalemia: Secondary | ICD-10-CM | POA: Diagnosis not present

## 2017-01-06 DIAGNOSIS — N2581 Secondary hyperparathyroidism of renal origin: Secondary | ICD-10-CM | POA: Diagnosis not present

## 2017-01-11 DIAGNOSIS — N2581 Secondary hyperparathyroidism of renal origin: Secondary | ICD-10-CM | POA: Diagnosis not present

## 2017-01-11 DIAGNOSIS — N186 End stage renal disease: Secondary | ICD-10-CM | POA: Diagnosis not present

## 2017-01-11 DIAGNOSIS — D509 Iron deficiency anemia, unspecified: Secondary | ICD-10-CM | POA: Diagnosis not present

## 2017-01-11 DIAGNOSIS — E875 Hyperkalemia: Secondary | ICD-10-CM | POA: Diagnosis not present

## 2017-01-11 DIAGNOSIS — D631 Anemia in chronic kidney disease: Secondary | ICD-10-CM | POA: Diagnosis not present

## 2017-01-13 DIAGNOSIS — N186 End stage renal disease: Secondary | ICD-10-CM | POA: Diagnosis not present

## 2017-01-13 DIAGNOSIS — I10 Essential (primary) hypertension: Secondary | ICD-10-CM | POA: Diagnosis not present

## 2017-01-13 DIAGNOSIS — Z992 Dependence on renal dialysis: Secondary | ICD-10-CM | POA: Diagnosis not present

## 2017-01-14 DIAGNOSIS — N2581 Secondary hyperparathyroidism of renal origin: Secondary | ICD-10-CM | POA: Diagnosis not present

## 2017-01-14 DIAGNOSIS — N186 End stage renal disease: Secondary | ICD-10-CM | POA: Diagnosis not present

## 2017-01-14 DIAGNOSIS — E875 Hyperkalemia: Secondary | ICD-10-CM | POA: Diagnosis not present

## 2017-01-14 DIAGNOSIS — D509 Iron deficiency anemia, unspecified: Secondary | ICD-10-CM | POA: Diagnosis not present

## 2017-01-14 DIAGNOSIS — D631 Anemia in chronic kidney disease: Secondary | ICD-10-CM | POA: Diagnosis not present

## 2017-01-16 DIAGNOSIS — N2581 Secondary hyperparathyroidism of renal origin: Secondary | ICD-10-CM | POA: Diagnosis not present

## 2017-01-16 DIAGNOSIS — E875 Hyperkalemia: Secondary | ICD-10-CM | POA: Diagnosis not present

## 2017-01-16 DIAGNOSIS — D509 Iron deficiency anemia, unspecified: Secondary | ICD-10-CM | POA: Diagnosis not present

## 2017-01-16 DIAGNOSIS — D631 Anemia in chronic kidney disease: Secondary | ICD-10-CM | POA: Diagnosis not present

## 2017-01-16 DIAGNOSIS — N186 End stage renal disease: Secondary | ICD-10-CM | POA: Diagnosis not present

## 2017-01-18 DIAGNOSIS — E875 Hyperkalemia: Secondary | ICD-10-CM | POA: Diagnosis not present

## 2017-01-18 DIAGNOSIS — N186 End stage renal disease: Secondary | ICD-10-CM | POA: Diagnosis not present

## 2017-01-18 DIAGNOSIS — N2581 Secondary hyperparathyroidism of renal origin: Secondary | ICD-10-CM | POA: Diagnosis not present

## 2017-01-18 DIAGNOSIS — D509 Iron deficiency anemia, unspecified: Secondary | ICD-10-CM | POA: Diagnosis not present

## 2017-01-18 DIAGNOSIS — D631 Anemia in chronic kidney disease: Secondary | ICD-10-CM | POA: Diagnosis not present

## 2017-01-20 DIAGNOSIS — N2581 Secondary hyperparathyroidism of renal origin: Secondary | ICD-10-CM | POA: Diagnosis not present

## 2017-01-20 DIAGNOSIS — N186 End stage renal disease: Secondary | ICD-10-CM | POA: Diagnosis not present

## 2017-01-20 DIAGNOSIS — E875 Hyperkalemia: Secondary | ICD-10-CM | POA: Diagnosis not present

## 2017-01-20 DIAGNOSIS — D509 Iron deficiency anemia, unspecified: Secondary | ICD-10-CM | POA: Diagnosis not present

## 2017-01-20 DIAGNOSIS — D631 Anemia in chronic kidney disease: Secondary | ICD-10-CM | POA: Diagnosis not present

## 2017-01-23 DIAGNOSIS — D631 Anemia in chronic kidney disease: Secondary | ICD-10-CM | POA: Diagnosis not present

## 2017-01-23 DIAGNOSIS — N2581 Secondary hyperparathyroidism of renal origin: Secondary | ICD-10-CM | POA: Diagnosis not present

## 2017-01-23 DIAGNOSIS — N186 End stage renal disease: Secondary | ICD-10-CM | POA: Diagnosis not present

## 2017-01-23 DIAGNOSIS — D509 Iron deficiency anemia, unspecified: Secondary | ICD-10-CM | POA: Diagnosis not present

## 2017-01-23 DIAGNOSIS — E875 Hyperkalemia: Secondary | ICD-10-CM | POA: Diagnosis not present

## 2017-01-25 DIAGNOSIS — N2581 Secondary hyperparathyroidism of renal origin: Secondary | ICD-10-CM | POA: Diagnosis not present

## 2017-01-25 DIAGNOSIS — D631 Anemia in chronic kidney disease: Secondary | ICD-10-CM | POA: Diagnosis not present

## 2017-01-25 DIAGNOSIS — D509 Iron deficiency anemia, unspecified: Secondary | ICD-10-CM | POA: Diagnosis not present

## 2017-01-25 DIAGNOSIS — E875 Hyperkalemia: Secondary | ICD-10-CM | POA: Diagnosis not present

## 2017-01-25 DIAGNOSIS — N186 End stage renal disease: Secondary | ICD-10-CM | POA: Diagnosis not present

## 2017-01-27 DIAGNOSIS — D631 Anemia in chronic kidney disease: Secondary | ICD-10-CM | POA: Diagnosis not present

## 2017-01-27 DIAGNOSIS — D509 Iron deficiency anemia, unspecified: Secondary | ICD-10-CM | POA: Diagnosis not present

## 2017-01-27 DIAGNOSIS — N2581 Secondary hyperparathyroidism of renal origin: Secondary | ICD-10-CM | POA: Diagnosis not present

## 2017-01-27 DIAGNOSIS — N186 End stage renal disease: Secondary | ICD-10-CM | POA: Diagnosis not present

## 2017-01-27 DIAGNOSIS — E875 Hyperkalemia: Secondary | ICD-10-CM | POA: Diagnosis not present

## 2017-01-30 DIAGNOSIS — N2581 Secondary hyperparathyroidism of renal origin: Secondary | ICD-10-CM | POA: Diagnosis not present

## 2017-01-30 DIAGNOSIS — D509 Iron deficiency anemia, unspecified: Secondary | ICD-10-CM | POA: Diagnosis not present

## 2017-01-30 DIAGNOSIS — E875 Hyperkalemia: Secondary | ICD-10-CM | POA: Diagnosis not present

## 2017-01-30 DIAGNOSIS — D631 Anemia in chronic kidney disease: Secondary | ICD-10-CM | POA: Diagnosis not present

## 2017-01-30 DIAGNOSIS — N186 End stage renal disease: Secondary | ICD-10-CM | POA: Diagnosis not present

## 2017-02-03 DIAGNOSIS — D631 Anemia in chronic kidney disease: Secondary | ICD-10-CM | POA: Diagnosis not present

## 2017-02-03 DIAGNOSIS — N2581 Secondary hyperparathyroidism of renal origin: Secondary | ICD-10-CM | POA: Diagnosis not present

## 2017-02-03 DIAGNOSIS — E875 Hyperkalemia: Secondary | ICD-10-CM | POA: Diagnosis not present

## 2017-02-03 DIAGNOSIS — D509 Iron deficiency anemia, unspecified: Secondary | ICD-10-CM | POA: Diagnosis not present

## 2017-02-03 DIAGNOSIS — N186 End stage renal disease: Secondary | ICD-10-CM | POA: Diagnosis not present

## 2017-02-06 DIAGNOSIS — N186 End stage renal disease: Secondary | ICD-10-CM | POA: Diagnosis not present

## 2017-02-06 DIAGNOSIS — E875 Hyperkalemia: Secondary | ICD-10-CM | POA: Diagnosis not present

## 2017-02-06 DIAGNOSIS — D509 Iron deficiency anemia, unspecified: Secondary | ICD-10-CM | POA: Diagnosis not present

## 2017-02-06 DIAGNOSIS — N2581 Secondary hyperparathyroidism of renal origin: Secondary | ICD-10-CM | POA: Diagnosis not present

## 2017-02-06 DIAGNOSIS — D631 Anemia in chronic kidney disease: Secondary | ICD-10-CM | POA: Diagnosis not present

## 2017-02-08 DIAGNOSIS — N186 End stage renal disease: Secondary | ICD-10-CM | POA: Diagnosis not present

## 2017-02-08 DIAGNOSIS — D631 Anemia in chronic kidney disease: Secondary | ICD-10-CM | POA: Diagnosis not present

## 2017-02-08 DIAGNOSIS — D509 Iron deficiency anemia, unspecified: Secondary | ICD-10-CM | POA: Diagnosis not present

## 2017-02-08 DIAGNOSIS — E875 Hyperkalemia: Secondary | ICD-10-CM | POA: Diagnosis not present

## 2017-02-08 DIAGNOSIS — N2581 Secondary hyperparathyroidism of renal origin: Secondary | ICD-10-CM | POA: Diagnosis not present

## 2017-02-10 DIAGNOSIS — E875 Hyperkalemia: Secondary | ICD-10-CM | POA: Diagnosis not present

## 2017-02-10 DIAGNOSIS — N186 End stage renal disease: Secondary | ICD-10-CM | POA: Diagnosis not present

## 2017-02-10 DIAGNOSIS — D509 Iron deficiency anemia, unspecified: Secondary | ICD-10-CM | POA: Diagnosis not present

## 2017-02-10 DIAGNOSIS — N2581 Secondary hyperparathyroidism of renal origin: Secondary | ICD-10-CM | POA: Diagnosis not present

## 2017-02-10 DIAGNOSIS — D631 Anemia in chronic kidney disease: Secondary | ICD-10-CM | POA: Diagnosis not present

## 2017-02-12 DIAGNOSIS — Z992 Dependence on renal dialysis: Secondary | ICD-10-CM | POA: Diagnosis not present

## 2017-02-12 DIAGNOSIS — I10 Essential (primary) hypertension: Secondary | ICD-10-CM | POA: Diagnosis not present

## 2017-02-12 DIAGNOSIS — N186 End stage renal disease: Secondary | ICD-10-CM | POA: Diagnosis not present

## 2017-02-13 DIAGNOSIS — N2581 Secondary hyperparathyroidism of renal origin: Secondary | ICD-10-CM | POA: Diagnosis not present

## 2017-02-13 DIAGNOSIS — E875 Hyperkalemia: Secondary | ICD-10-CM | POA: Diagnosis not present

## 2017-02-13 DIAGNOSIS — N186 End stage renal disease: Secondary | ICD-10-CM | POA: Diagnosis not present

## 2017-02-13 DIAGNOSIS — D631 Anemia in chronic kidney disease: Secondary | ICD-10-CM | POA: Diagnosis not present

## 2017-02-15 DIAGNOSIS — N186 End stage renal disease: Secondary | ICD-10-CM | POA: Diagnosis not present

## 2017-02-15 DIAGNOSIS — E875 Hyperkalemia: Secondary | ICD-10-CM | POA: Diagnosis not present

## 2017-02-15 DIAGNOSIS — D631 Anemia in chronic kidney disease: Secondary | ICD-10-CM | POA: Diagnosis not present

## 2017-02-15 DIAGNOSIS — N2581 Secondary hyperparathyroidism of renal origin: Secondary | ICD-10-CM | POA: Diagnosis not present

## 2017-02-17 DIAGNOSIS — D631 Anemia in chronic kidney disease: Secondary | ICD-10-CM | POA: Diagnosis not present

## 2017-02-17 DIAGNOSIS — N2581 Secondary hyperparathyroidism of renal origin: Secondary | ICD-10-CM | POA: Diagnosis not present

## 2017-02-17 DIAGNOSIS — N186 End stage renal disease: Secondary | ICD-10-CM | POA: Diagnosis not present

## 2017-02-17 DIAGNOSIS — E875 Hyperkalemia: Secondary | ICD-10-CM | POA: Diagnosis not present

## 2017-02-20 DIAGNOSIS — N186 End stage renal disease: Secondary | ICD-10-CM | POA: Diagnosis not present

## 2017-02-20 DIAGNOSIS — N2581 Secondary hyperparathyroidism of renal origin: Secondary | ICD-10-CM | POA: Diagnosis not present

## 2017-02-20 DIAGNOSIS — E875 Hyperkalemia: Secondary | ICD-10-CM | POA: Diagnosis not present

## 2017-02-20 DIAGNOSIS — D631 Anemia in chronic kidney disease: Secondary | ICD-10-CM | POA: Diagnosis not present

## 2017-02-22 DIAGNOSIS — E875 Hyperkalemia: Secondary | ICD-10-CM | POA: Diagnosis not present

## 2017-02-22 DIAGNOSIS — N2581 Secondary hyperparathyroidism of renal origin: Secondary | ICD-10-CM | POA: Diagnosis not present

## 2017-02-22 DIAGNOSIS — N186 End stage renal disease: Secondary | ICD-10-CM | POA: Diagnosis not present

## 2017-02-22 DIAGNOSIS — D631 Anemia in chronic kidney disease: Secondary | ICD-10-CM | POA: Diagnosis not present

## 2017-02-24 DIAGNOSIS — D631 Anemia in chronic kidney disease: Secondary | ICD-10-CM | POA: Diagnosis not present

## 2017-02-24 DIAGNOSIS — N2581 Secondary hyperparathyroidism of renal origin: Secondary | ICD-10-CM | POA: Diagnosis not present

## 2017-02-24 DIAGNOSIS — N186 End stage renal disease: Secondary | ICD-10-CM | POA: Diagnosis not present

## 2017-02-24 DIAGNOSIS — E875 Hyperkalemia: Secondary | ICD-10-CM | POA: Diagnosis not present

## 2017-02-27 DIAGNOSIS — E875 Hyperkalemia: Secondary | ICD-10-CM | POA: Diagnosis not present

## 2017-02-27 DIAGNOSIS — D631 Anemia in chronic kidney disease: Secondary | ICD-10-CM | POA: Diagnosis not present

## 2017-02-27 DIAGNOSIS — N186 End stage renal disease: Secondary | ICD-10-CM | POA: Diagnosis not present

## 2017-02-27 DIAGNOSIS — N2581 Secondary hyperparathyroidism of renal origin: Secondary | ICD-10-CM | POA: Diagnosis not present

## 2017-03-01 DIAGNOSIS — N2581 Secondary hyperparathyroidism of renal origin: Secondary | ICD-10-CM | POA: Diagnosis not present

## 2017-03-01 DIAGNOSIS — E875 Hyperkalemia: Secondary | ICD-10-CM | POA: Diagnosis not present

## 2017-03-01 DIAGNOSIS — D631 Anemia in chronic kidney disease: Secondary | ICD-10-CM | POA: Diagnosis not present

## 2017-03-01 DIAGNOSIS — N186 End stage renal disease: Secondary | ICD-10-CM | POA: Diagnosis not present

## 2017-03-03 DIAGNOSIS — D631 Anemia in chronic kidney disease: Secondary | ICD-10-CM | POA: Diagnosis not present

## 2017-03-03 DIAGNOSIS — N2581 Secondary hyperparathyroidism of renal origin: Secondary | ICD-10-CM | POA: Diagnosis not present

## 2017-03-03 DIAGNOSIS — N186 End stage renal disease: Secondary | ICD-10-CM | POA: Diagnosis not present

## 2017-03-03 DIAGNOSIS — E875 Hyperkalemia: Secondary | ICD-10-CM | POA: Diagnosis not present

## 2017-03-06 DIAGNOSIS — N186 End stage renal disease: Secondary | ICD-10-CM | POA: Diagnosis not present

## 2017-03-06 DIAGNOSIS — E875 Hyperkalemia: Secondary | ICD-10-CM | POA: Diagnosis not present

## 2017-03-06 DIAGNOSIS — D631 Anemia in chronic kidney disease: Secondary | ICD-10-CM | POA: Diagnosis not present

## 2017-03-06 DIAGNOSIS — N2581 Secondary hyperparathyroidism of renal origin: Secondary | ICD-10-CM | POA: Diagnosis not present

## 2017-03-08 DIAGNOSIS — E875 Hyperkalemia: Secondary | ICD-10-CM | POA: Diagnosis not present

## 2017-03-08 DIAGNOSIS — D631 Anemia in chronic kidney disease: Secondary | ICD-10-CM | POA: Diagnosis not present

## 2017-03-08 DIAGNOSIS — N186 End stage renal disease: Secondary | ICD-10-CM | POA: Diagnosis not present

## 2017-03-08 DIAGNOSIS — N2581 Secondary hyperparathyroidism of renal origin: Secondary | ICD-10-CM | POA: Diagnosis not present

## 2017-03-10 DIAGNOSIS — N2581 Secondary hyperparathyroidism of renal origin: Secondary | ICD-10-CM | POA: Diagnosis not present

## 2017-03-10 DIAGNOSIS — E875 Hyperkalemia: Secondary | ICD-10-CM | POA: Diagnosis not present

## 2017-03-10 DIAGNOSIS — D631 Anemia in chronic kidney disease: Secondary | ICD-10-CM | POA: Diagnosis not present

## 2017-03-10 DIAGNOSIS — N186 End stage renal disease: Secondary | ICD-10-CM | POA: Diagnosis not present

## 2017-03-13 DIAGNOSIS — D631 Anemia in chronic kidney disease: Secondary | ICD-10-CM | POA: Diagnosis not present

## 2017-03-13 DIAGNOSIS — N2581 Secondary hyperparathyroidism of renal origin: Secondary | ICD-10-CM | POA: Diagnosis not present

## 2017-03-13 DIAGNOSIS — E875 Hyperkalemia: Secondary | ICD-10-CM | POA: Diagnosis not present

## 2017-03-13 DIAGNOSIS — N186 End stage renal disease: Secondary | ICD-10-CM | POA: Diagnosis not present

## 2017-03-15 DIAGNOSIS — D631 Anemia in chronic kidney disease: Secondary | ICD-10-CM | POA: Diagnosis not present

## 2017-03-15 DIAGNOSIS — E875 Hyperkalemia: Secondary | ICD-10-CM | POA: Diagnosis not present

## 2017-03-15 DIAGNOSIS — I10 Essential (primary) hypertension: Secondary | ICD-10-CM | POA: Diagnosis not present

## 2017-03-15 DIAGNOSIS — N186 End stage renal disease: Secondary | ICD-10-CM | POA: Diagnosis not present

## 2017-03-15 DIAGNOSIS — Z992 Dependence on renal dialysis: Secondary | ICD-10-CM | POA: Diagnosis not present

## 2017-03-15 DIAGNOSIS — N2581 Secondary hyperparathyroidism of renal origin: Secondary | ICD-10-CM | POA: Diagnosis not present

## 2017-03-17 DIAGNOSIS — E875 Hyperkalemia: Secondary | ICD-10-CM | POA: Diagnosis not present

## 2017-03-17 DIAGNOSIS — N186 End stage renal disease: Secondary | ICD-10-CM | POA: Diagnosis not present

## 2017-03-17 DIAGNOSIS — N2581 Secondary hyperparathyroidism of renal origin: Secondary | ICD-10-CM | POA: Diagnosis not present

## 2017-03-17 DIAGNOSIS — D631 Anemia in chronic kidney disease: Secondary | ICD-10-CM | POA: Diagnosis not present

## 2017-03-20 DIAGNOSIS — E875 Hyperkalemia: Secondary | ICD-10-CM | POA: Diagnosis not present

## 2017-03-20 DIAGNOSIS — D631 Anemia in chronic kidney disease: Secondary | ICD-10-CM | POA: Diagnosis not present

## 2017-03-20 DIAGNOSIS — N2581 Secondary hyperparathyroidism of renal origin: Secondary | ICD-10-CM | POA: Diagnosis not present

## 2017-03-20 DIAGNOSIS — N186 End stage renal disease: Secondary | ICD-10-CM | POA: Diagnosis not present

## 2017-03-22 DIAGNOSIS — N186 End stage renal disease: Secondary | ICD-10-CM | POA: Diagnosis not present

## 2017-03-22 DIAGNOSIS — D631 Anemia in chronic kidney disease: Secondary | ICD-10-CM | POA: Diagnosis not present

## 2017-03-22 DIAGNOSIS — N2581 Secondary hyperparathyroidism of renal origin: Secondary | ICD-10-CM | POA: Diagnosis not present

## 2017-03-22 DIAGNOSIS — E875 Hyperkalemia: Secondary | ICD-10-CM | POA: Diagnosis not present

## 2017-03-24 DIAGNOSIS — N186 End stage renal disease: Secondary | ICD-10-CM | POA: Diagnosis not present

## 2017-03-24 DIAGNOSIS — D631 Anemia in chronic kidney disease: Secondary | ICD-10-CM | POA: Diagnosis not present

## 2017-03-24 DIAGNOSIS — E875 Hyperkalemia: Secondary | ICD-10-CM | POA: Diagnosis not present

## 2017-03-24 DIAGNOSIS — N2581 Secondary hyperparathyroidism of renal origin: Secondary | ICD-10-CM | POA: Diagnosis not present

## 2017-03-27 DIAGNOSIS — E875 Hyperkalemia: Secondary | ICD-10-CM | POA: Diagnosis not present

## 2017-03-27 DIAGNOSIS — N186 End stage renal disease: Secondary | ICD-10-CM | POA: Diagnosis not present

## 2017-03-27 DIAGNOSIS — N2581 Secondary hyperparathyroidism of renal origin: Secondary | ICD-10-CM | POA: Diagnosis not present

## 2017-03-27 DIAGNOSIS — D631 Anemia in chronic kidney disease: Secondary | ICD-10-CM | POA: Diagnosis not present

## 2017-03-29 DIAGNOSIS — N2581 Secondary hyperparathyroidism of renal origin: Secondary | ICD-10-CM | POA: Diagnosis not present

## 2017-03-29 DIAGNOSIS — N186 End stage renal disease: Secondary | ICD-10-CM | POA: Diagnosis not present

## 2017-03-29 DIAGNOSIS — E875 Hyperkalemia: Secondary | ICD-10-CM | POA: Diagnosis not present

## 2017-03-29 DIAGNOSIS — D631 Anemia in chronic kidney disease: Secondary | ICD-10-CM | POA: Diagnosis not present

## 2017-03-31 DIAGNOSIS — E875 Hyperkalemia: Secondary | ICD-10-CM | POA: Diagnosis not present

## 2017-03-31 DIAGNOSIS — N186 End stage renal disease: Secondary | ICD-10-CM | POA: Diagnosis not present

## 2017-03-31 DIAGNOSIS — N2581 Secondary hyperparathyroidism of renal origin: Secondary | ICD-10-CM | POA: Diagnosis not present

## 2017-03-31 DIAGNOSIS — D631 Anemia in chronic kidney disease: Secondary | ICD-10-CM | POA: Diagnosis not present

## 2017-04-02 DIAGNOSIS — N2581 Secondary hyperparathyroidism of renal origin: Secondary | ICD-10-CM | POA: Diagnosis not present

## 2017-04-02 DIAGNOSIS — D631 Anemia in chronic kidney disease: Secondary | ICD-10-CM | POA: Diagnosis not present

## 2017-04-02 DIAGNOSIS — N186 End stage renal disease: Secondary | ICD-10-CM | POA: Diagnosis not present

## 2017-04-02 DIAGNOSIS — E875 Hyperkalemia: Secondary | ICD-10-CM | POA: Diagnosis not present

## 2017-04-04 DIAGNOSIS — D631 Anemia in chronic kidney disease: Secondary | ICD-10-CM | POA: Diagnosis not present

## 2017-04-04 DIAGNOSIS — E875 Hyperkalemia: Secondary | ICD-10-CM | POA: Diagnosis not present

## 2017-04-04 DIAGNOSIS — N2581 Secondary hyperparathyroidism of renal origin: Secondary | ICD-10-CM | POA: Diagnosis not present

## 2017-04-04 DIAGNOSIS — N186 End stage renal disease: Secondary | ICD-10-CM | POA: Diagnosis not present

## 2017-04-07 DIAGNOSIS — D631 Anemia in chronic kidney disease: Secondary | ICD-10-CM | POA: Diagnosis not present

## 2017-04-07 DIAGNOSIS — E875 Hyperkalemia: Secondary | ICD-10-CM | POA: Diagnosis not present

## 2017-04-07 DIAGNOSIS — N186 End stage renal disease: Secondary | ICD-10-CM | POA: Diagnosis not present

## 2017-04-07 DIAGNOSIS — N2581 Secondary hyperparathyroidism of renal origin: Secondary | ICD-10-CM | POA: Diagnosis not present

## 2017-04-10 DIAGNOSIS — N186 End stage renal disease: Secondary | ICD-10-CM | POA: Diagnosis not present

## 2017-04-10 DIAGNOSIS — E875 Hyperkalemia: Secondary | ICD-10-CM | POA: Diagnosis not present

## 2017-04-10 DIAGNOSIS — D631 Anemia in chronic kidney disease: Secondary | ICD-10-CM | POA: Diagnosis not present

## 2017-04-10 DIAGNOSIS — N2581 Secondary hyperparathyroidism of renal origin: Secondary | ICD-10-CM | POA: Diagnosis not present

## 2017-04-12 DIAGNOSIS — E875 Hyperkalemia: Secondary | ICD-10-CM | POA: Diagnosis not present

## 2017-04-12 DIAGNOSIS — N186 End stage renal disease: Secondary | ICD-10-CM | POA: Diagnosis not present

## 2017-04-12 DIAGNOSIS — D631 Anemia in chronic kidney disease: Secondary | ICD-10-CM | POA: Diagnosis not present

## 2017-04-12 DIAGNOSIS — N2581 Secondary hyperparathyroidism of renal origin: Secondary | ICD-10-CM | POA: Diagnosis not present

## 2017-04-14 DIAGNOSIS — E875 Hyperkalemia: Secondary | ICD-10-CM | POA: Diagnosis not present

## 2017-04-14 DIAGNOSIS — I10 Essential (primary) hypertension: Secondary | ICD-10-CM | POA: Diagnosis not present

## 2017-04-14 DIAGNOSIS — D631 Anemia in chronic kidney disease: Secondary | ICD-10-CM | POA: Diagnosis not present

## 2017-04-14 DIAGNOSIS — N186 End stage renal disease: Secondary | ICD-10-CM | POA: Diagnosis not present

## 2017-04-14 DIAGNOSIS — Z992 Dependence on renal dialysis: Secondary | ICD-10-CM | POA: Diagnosis not present

## 2017-04-14 DIAGNOSIS — N2581 Secondary hyperparathyroidism of renal origin: Secondary | ICD-10-CM | POA: Diagnosis not present

## 2017-04-17 DIAGNOSIS — N186 End stage renal disease: Secondary | ICD-10-CM | POA: Diagnosis not present

## 2017-04-17 DIAGNOSIS — E875 Hyperkalemia: Secondary | ICD-10-CM | POA: Diagnosis not present

## 2017-04-17 DIAGNOSIS — D631 Anemia in chronic kidney disease: Secondary | ICD-10-CM | POA: Diagnosis not present

## 2017-04-17 DIAGNOSIS — N2581 Secondary hyperparathyroidism of renal origin: Secondary | ICD-10-CM | POA: Diagnosis not present

## 2017-04-19 DIAGNOSIS — N2581 Secondary hyperparathyroidism of renal origin: Secondary | ICD-10-CM | POA: Diagnosis not present

## 2017-04-19 DIAGNOSIS — N186 End stage renal disease: Secondary | ICD-10-CM | POA: Diagnosis not present

## 2017-04-19 DIAGNOSIS — D631 Anemia in chronic kidney disease: Secondary | ICD-10-CM | POA: Diagnosis not present

## 2017-04-19 DIAGNOSIS — E875 Hyperkalemia: Secondary | ICD-10-CM | POA: Diagnosis not present

## 2017-04-21 DIAGNOSIS — N2581 Secondary hyperparathyroidism of renal origin: Secondary | ICD-10-CM | POA: Diagnosis not present

## 2017-04-21 DIAGNOSIS — N186 End stage renal disease: Secondary | ICD-10-CM | POA: Diagnosis not present

## 2017-04-21 DIAGNOSIS — E875 Hyperkalemia: Secondary | ICD-10-CM | POA: Diagnosis not present

## 2017-04-21 DIAGNOSIS — D631 Anemia in chronic kidney disease: Secondary | ICD-10-CM | POA: Diagnosis not present

## 2017-04-26 DIAGNOSIS — D631 Anemia in chronic kidney disease: Secondary | ICD-10-CM | POA: Diagnosis not present

## 2017-04-26 DIAGNOSIS — E875 Hyperkalemia: Secondary | ICD-10-CM | POA: Diagnosis not present

## 2017-04-26 DIAGNOSIS — N2581 Secondary hyperparathyroidism of renal origin: Secondary | ICD-10-CM | POA: Diagnosis not present

## 2017-04-26 DIAGNOSIS — N186 End stage renal disease: Secondary | ICD-10-CM | POA: Diagnosis not present

## 2017-04-28 DIAGNOSIS — N2581 Secondary hyperparathyroidism of renal origin: Secondary | ICD-10-CM | POA: Diagnosis not present

## 2017-04-28 DIAGNOSIS — N186 End stage renal disease: Secondary | ICD-10-CM | POA: Diagnosis not present

## 2017-04-28 DIAGNOSIS — E875 Hyperkalemia: Secondary | ICD-10-CM | POA: Diagnosis not present

## 2017-04-28 DIAGNOSIS — D631 Anemia in chronic kidney disease: Secondary | ICD-10-CM | POA: Diagnosis not present

## 2017-05-01 DIAGNOSIS — N2581 Secondary hyperparathyroidism of renal origin: Secondary | ICD-10-CM | POA: Diagnosis not present

## 2017-05-01 DIAGNOSIS — E875 Hyperkalemia: Secondary | ICD-10-CM | POA: Diagnosis not present

## 2017-05-01 DIAGNOSIS — N186 End stage renal disease: Secondary | ICD-10-CM | POA: Diagnosis not present

## 2017-05-01 DIAGNOSIS — D631 Anemia in chronic kidney disease: Secondary | ICD-10-CM | POA: Diagnosis not present

## 2017-05-03 DIAGNOSIS — E875 Hyperkalemia: Secondary | ICD-10-CM | POA: Diagnosis not present

## 2017-05-03 DIAGNOSIS — N2581 Secondary hyperparathyroidism of renal origin: Secondary | ICD-10-CM | POA: Diagnosis not present

## 2017-05-03 DIAGNOSIS — N186 End stage renal disease: Secondary | ICD-10-CM | POA: Diagnosis not present

## 2017-05-03 DIAGNOSIS — D631 Anemia in chronic kidney disease: Secondary | ICD-10-CM | POA: Diagnosis not present

## 2017-05-05 DIAGNOSIS — E875 Hyperkalemia: Secondary | ICD-10-CM | POA: Diagnosis not present

## 2017-05-05 DIAGNOSIS — N186 End stage renal disease: Secondary | ICD-10-CM | POA: Diagnosis not present

## 2017-05-05 DIAGNOSIS — D631 Anemia in chronic kidney disease: Secondary | ICD-10-CM | POA: Diagnosis not present

## 2017-05-05 DIAGNOSIS — N2581 Secondary hyperparathyroidism of renal origin: Secondary | ICD-10-CM | POA: Diagnosis not present

## 2017-05-07 DIAGNOSIS — N186 End stage renal disease: Secondary | ICD-10-CM | POA: Diagnosis not present

## 2017-05-07 DIAGNOSIS — D631 Anemia in chronic kidney disease: Secondary | ICD-10-CM | POA: Diagnosis not present

## 2017-05-07 DIAGNOSIS — E875 Hyperkalemia: Secondary | ICD-10-CM | POA: Diagnosis not present

## 2017-05-07 DIAGNOSIS — N2581 Secondary hyperparathyroidism of renal origin: Secondary | ICD-10-CM | POA: Diagnosis not present

## 2017-05-10 DIAGNOSIS — N2581 Secondary hyperparathyroidism of renal origin: Secondary | ICD-10-CM | POA: Diagnosis not present

## 2017-05-10 DIAGNOSIS — E875 Hyperkalemia: Secondary | ICD-10-CM | POA: Diagnosis not present

## 2017-05-10 DIAGNOSIS — D631 Anemia in chronic kidney disease: Secondary | ICD-10-CM | POA: Diagnosis not present

## 2017-05-10 DIAGNOSIS — N186 End stage renal disease: Secondary | ICD-10-CM | POA: Diagnosis not present

## 2017-05-12 DIAGNOSIS — N2581 Secondary hyperparathyroidism of renal origin: Secondary | ICD-10-CM | POA: Diagnosis not present

## 2017-05-12 DIAGNOSIS — N186 End stage renal disease: Secondary | ICD-10-CM | POA: Diagnosis not present

## 2017-05-12 DIAGNOSIS — E875 Hyperkalemia: Secondary | ICD-10-CM | POA: Diagnosis not present

## 2017-05-12 DIAGNOSIS — D631 Anemia in chronic kidney disease: Secondary | ICD-10-CM | POA: Diagnosis not present

## 2017-05-14 DIAGNOSIS — N2581 Secondary hyperparathyroidism of renal origin: Secondary | ICD-10-CM | POA: Diagnosis not present

## 2017-05-14 DIAGNOSIS — N186 End stage renal disease: Secondary | ICD-10-CM | POA: Diagnosis not present

## 2017-05-14 DIAGNOSIS — D631 Anemia in chronic kidney disease: Secondary | ICD-10-CM | POA: Diagnosis not present

## 2017-05-14 DIAGNOSIS — E875 Hyperkalemia: Secondary | ICD-10-CM | POA: Diagnosis not present

## 2017-05-15 DIAGNOSIS — I10 Essential (primary) hypertension: Secondary | ICD-10-CM | POA: Diagnosis not present

## 2017-05-15 DIAGNOSIS — N186 End stage renal disease: Secondary | ICD-10-CM | POA: Diagnosis not present

## 2017-05-15 DIAGNOSIS — Z992 Dependence on renal dialysis: Secondary | ICD-10-CM | POA: Diagnosis not present

## 2017-05-17 DIAGNOSIS — N2581 Secondary hyperparathyroidism of renal origin: Secondary | ICD-10-CM | POA: Diagnosis not present

## 2017-05-17 DIAGNOSIS — D509 Iron deficiency anemia, unspecified: Secondary | ICD-10-CM | POA: Diagnosis not present

## 2017-05-17 DIAGNOSIS — E875 Hyperkalemia: Secondary | ICD-10-CM | POA: Diagnosis not present

## 2017-05-17 DIAGNOSIS — D631 Anemia in chronic kidney disease: Secondary | ICD-10-CM | POA: Diagnosis not present

## 2017-05-17 DIAGNOSIS — N186 End stage renal disease: Secondary | ICD-10-CM | POA: Diagnosis not present

## 2017-05-19 DIAGNOSIS — D631 Anemia in chronic kidney disease: Secondary | ICD-10-CM | POA: Diagnosis not present

## 2017-05-19 DIAGNOSIS — N186 End stage renal disease: Secondary | ICD-10-CM | POA: Diagnosis not present

## 2017-05-19 DIAGNOSIS — D509 Iron deficiency anemia, unspecified: Secondary | ICD-10-CM | POA: Diagnosis not present

## 2017-05-19 DIAGNOSIS — N2581 Secondary hyperparathyroidism of renal origin: Secondary | ICD-10-CM | POA: Diagnosis not present

## 2017-05-19 DIAGNOSIS — E875 Hyperkalemia: Secondary | ICD-10-CM | POA: Diagnosis not present

## 2017-05-22 DIAGNOSIS — D631 Anemia in chronic kidney disease: Secondary | ICD-10-CM | POA: Diagnosis not present

## 2017-05-22 DIAGNOSIS — N2581 Secondary hyperparathyroidism of renal origin: Secondary | ICD-10-CM | POA: Diagnosis not present

## 2017-05-22 DIAGNOSIS — D509 Iron deficiency anemia, unspecified: Secondary | ICD-10-CM | POA: Diagnosis not present

## 2017-05-22 DIAGNOSIS — E875 Hyperkalemia: Secondary | ICD-10-CM | POA: Diagnosis not present

## 2017-05-22 DIAGNOSIS — N186 End stage renal disease: Secondary | ICD-10-CM | POA: Diagnosis not present

## 2017-05-24 DIAGNOSIS — N186 End stage renal disease: Secondary | ICD-10-CM | POA: Diagnosis not present

## 2017-05-24 DIAGNOSIS — D509 Iron deficiency anemia, unspecified: Secondary | ICD-10-CM | POA: Diagnosis not present

## 2017-05-24 DIAGNOSIS — R519 Headache, unspecified: Secondary | ICD-10-CM | POA: Insufficient documentation

## 2017-05-24 DIAGNOSIS — E875 Hyperkalemia: Secondary | ICD-10-CM | POA: Diagnosis not present

## 2017-05-24 DIAGNOSIS — D631 Anemia in chronic kidney disease: Secondary | ICD-10-CM | POA: Diagnosis not present

## 2017-05-24 DIAGNOSIS — N2581 Secondary hyperparathyroidism of renal origin: Secondary | ICD-10-CM | POA: Diagnosis not present

## 2017-05-26 DIAGNOSIS — E875 Hyperkalemia: Secondary | ICD-10-CM | POA: Diagnosis not present

## 2017-05-26 DIAGNOSIS — D509 Iron deficiency anemia, unspecified: Secondary | ICD-10-CM | POA: Diagnosis not present

## 2017-05-26 DIAGNOSIS — N186 End stage renal disease: Secondary | ICD-10-CM | POA: Diagnosis not present

## 2017-05-26 DIAGNOSIS — D631 Anemia in chronic kidney disease: Secondary | ICD-10-CM | POA: Diagnosis not present

## 2017-05-26 DIAGNOSIS — N2581 Secondary hyperparathyroidism of renal origin: Secondary | ICD-10-CM | POA: Diagnosis not present

## 2017-05-29 DIAGNOSIS — E875 Hyperkalemia: Secondary | ICD-10-CM | POA: Diagnosis not present

## 2017-05-29 DIAGNOSIS — D631 Anemia in chronic kidney disease: Secondary | ICD-10-CM | POA: Diagnosis not present

## 2017-05-29 DIAGNOSIS — N2581 Secondary hyperparathyroidism of renal origin: Secondary | ICD-10-CM | POA: Diagnosis not present

## 2017-05-29 DIAGNOSIS — D509 Iron deficiency anemia, unspecified: Secondary | ICD-10-CM | POA: Diagnosis not present

## 2017-05-29 DIAGNOSIS — N186 End stage renal disease: Secondary | ICD-10-CM | POA: Diagnosis not present

## 2017-05-31 DIAGNOSIS — N2581 Secondary hyperparathyroidism of renal origin: Secondary | ICD-10-CM | POA: Diagnosis not present

## 2017-05-31 DIAGNOSIS — E875 Hyperkalemia: Secondary | ICD-10-CM | POA: Diagnosis not present

## 2017-05-31 DIAGNOSIS — D509 Iron deficiency anemia, unspecified: Secondary | ICD-10-CM | POA: Diagnosis not present

## 2017-05-31 DIAGNOSIS — D631 Anemia in chronic kidney disease: Secondary | ICD-10-CM | POA: Diagnosis not present

## 2017-05-31 DIAGNOSIS — N186 End stage renal disease: Secondary | ICD-10-CM | POA: Diagnosis not present

## 2017-06-02 DIAGNOSIS — D509 Iron deficiency anemia, unspecified: Secondary | ICD-10-CM | POA: Diagnosis not present

## 2017-06-02 DIAGNOSIS — E875 Hyperkalemia: Secondary | ICD-10-CM | POA: Diagnosis not present

## 2017-06-02 DIAGNOSIS — N186 End stage renal disease: Secondary | ICD-10-CM | POA: Diagnosis not present

## 2017-06-02 DIAGNOSIS — D631 Anemia in chronic kidney disease: Secondary | ICD-10-CM | POA: Diagnosis not present

## 2017-06-02 DIAGNOSIS — N2581 Secondary hyperparathyroidism of renal origin: Secondary | ICD-10-CM | POA: Diagnosis not present

## 2017-06-05 DIAGNOSIS — N186 End stage renal disease: Secondary | ICD-10-CM | POA: Diagnosis not present

## 2017-06-05 DIAGNOSIS — N2581 Secondary hyperparathyroidism of renal origin: Secondary | ICD-10-CM | POA: Diagnosis not present

## 2017-06-05 DIAGNOSIS — D631 Anemia in chronic kidney disease: Secondary | ICD-10-CM | POA: Diagnosis not present

## 2017-06-05 DIAGNOSIS — E875 Hyperkalemia: Secondary | ICD-10-CM | POA: Diagnosis not present

## 2017-06-05 DIAGNOSIS — D509 Iron deficiency anemia, unspecified: Secondary | ICD-10-CM | POA: Diagnosis not present

## 2017-06-07 DIAGNOSIS — D509 Iron deficiency anemia, unspecified: Secondary | ICD-10-CM | POA: Diagnosis not present

## 2017-06-07 DIAGNOSIS — D631 Anemia in chronic kidney disease: Secondary | ICD-10-CM | POA: Diagnosis not present

## 2017-06-07 DIAGNOSIS — N186 End stage renal disease: Secondary | ICD-10-CM | POA: Diagnosis not present

## 2017-06-07 DIAGNOSIS — E875 Hyperkalemia: Secondary | ICD-10-CM | POA: Diagnosis not present

## 2017-06-07 DIAGNOSIS — N2581 Secondary hyperparathyroidism of renal origin: Secondary | ICD-10-CM | POA: Diagnosis not present

## 2017-06-09 DIAGNOSIS — N186 End stage renal disease: Secondary | ICD-10-CM | POA: Diagnosis not present

## 2017-06-09 DIAGNOSIS — N2581 Secondary hyperparathyroidism of renal origin: Secondary | ICD-10-CM | POA: Diagnosis not present

## 2017-06-09 DIAGNOSIS — D631 Anemia in chronic kidney disease: Secondary | ICD-10-CM | POA: Diagnosis not present

## 2017-06-09 DIAGNOSIS — E875 Hyperkalemia: Secondary | ICD-10-CM | POA: Diagnosis not present

## 2017-06-09 DIAGNOSIS — D509 Iron deficiency anemia, unspecified: Secondary | ICD-10-CM | POA: Diagnosis not present

## 2017-06-12 DIAGNOSIS — N186 End stage renal disease: Secondary | ICD-10-CM | POA: Diagnosis not present

## 2017-06-12 DIAGNOSIS — D509 Iron deficiency anemia, unspecified: Secondary | ICD-10-CM | POA: Diagnosis not present

## 2017-06-12 DIAGNOSIS — D631 Anemia in chronic kidney disease: Secondary | ICD-10-CM | POA: Diagnosis not present

## 2017-06-12 DIAGNOSIS — N2581 Secondary hyperparathyroidism of renal origin: Secondary | ICD-10-CM | POA: Diagnosis not present

## 2017-06-12 DIAGNOSIS — E875 Hyperkalemia: Secondary | ICD-10-CM | POA: Diagnosis not present

## 2017-06-14 DIAGNOSIS — N2581 Secondary hyperparathyroidism of renal origin: Secondary | ICD-10-CM | POA: Diagnosis not present

## 2017-06-14 DIAGNOSIS — N186 End stage renal disease: Secondary | ICD-10-CM | POA: Diagnosis not present

## 2017-06-14 DIAGNOSIS — D509 Iron deficiency anemia, unspecified: Secondary | ICD-10-CM | POA: Diagnosis not present

## 2017-06-14 DIAGNOSIS — D631 Anemia in chronic kidney disease: Secondary | ICD-10-CM | POA: Diagnosis not present

## 2017-06-14 DIAGNOSIS — E875 Hyperkalemia: Secondary | ICD-10-CM | POA: Diagnosis not present

## 2017-06-15 DIAGNOSIS — Z992 Dependence on renal dialysis: Secondary | ICD-10-CM | POA: Diagnosis not present

## 2017-06-15 DIAGNOSIS — I10 Essential (primary) hypertension: Secondary | ICD-10-CM | POA: Diagnosis not present

## 2017-06-15 DIAGNOSIS — N186 End stage renal disease: Secondary | ICD-10-CM | POA: Diagnosis not present

## 2017-06-16 DIAGNOSIS — D509 Iron deficiency anemia, unspecified: Secondary | ICD-10-CM | POA: Diagnosis not present

## 2017-06-16 DIAGNOSIS — D631 Anemia in chronic kidney disease: Secondary | ICD-10-CM | POA: Diagnosis not present

## 2017-06-16 DIAGNOSIS — N2581 Secondary hyperparathyroidism of renal origin: Secondary | ICD-10-CM | POA: Diagnosis not present

## 2017-06-16 DIAGNOSIS — E875 Hyperkalemia: Secondary | ICD-10-CM | POA: Diagnosis not present

## 2017-06-16 DIAGNOSIS — N186 End stage renal disease: Secondary | ICD-10-CM | POA: Diagnosis not present

## 2017-06-16 DIAGNOSIS — Z992 Dependence on renal dialysis: Secondary | ICD-10-CM | POA: Diagnosis not present

## 2017-06-16 DIAGNOSIS — I10 Essential (primary) hypertension: Secondary | ICD-10-CM | POA: Diagnosis not present

## 2017-06-19 DIAGNOSIS — E875 Hyperkalemia: Secondary | ICD-10-CM | POA: Diagnosis not present

## 2017-06-19 DIAGNOSIS — N186 End stage renal disease: Secondary | ICD-10-CM | POA: Diagnosis not present

## 2017-06-19 DIAGNOSIS — N2581 Secondary hyperparathyroidism of renal origin: Secondary | ICD-10-CM | POA: Diagnosis not present

## 2017-06-19 DIAGNOSIS — D509 Iron deficiency anemia, unspecified: Secondary | ICD-10-CM | POA: Diagnosis not present

## 2017-06-19 DIAGNOSIS — D631 Anemia in chronic kidney disease: Secondary | ICD-10-CM | POA: Diagnosis not present

## 2017-06-21 DIAGNOSIS — N186 End stage renal disease: Secondary | ICD-10-CM | POA: Diagnosis not present

## 2017-06-21 DIAGNOSIS — D631 Anemia in chronic kidney disease: Secondary | ICD-10-CM | POA: Diagnosis not present

## 2017-06-21 DIAGNOSIS — N2581 Secondary hyperparathyroidism of renal origin: Secondary | ICD-10-CM | POA: Diagnosis not present

## 2017-06-21 DIAGNOSIS — D509 Iron deficiency anemia, unspecified: Secondary | ICD-10-CM | POA: Diagnosis not present

## 2017-06-21 DIAGNOSIS — E875 Hyperkalemia: Secondary | ICD-10-CM | POA: Diagnosis not present

## 2017-06-23 DIAGNOSIS — D509 Iron deficiency anemia, unspecified: Secondary | ICD-10-CM | POA: Diagnosis not present

## 2017-06-23 DIAGNOSIS — N186 End stage renal disease: Secondary | ICD-10-CM | POA: Diagnosis not present

## 2017-06-23 DIAGNOSIS — N2581 Secondary hyperparathyroidism of renal origin: Secondary | ICD-10-CM | POA: Diagnosis not present

## 2017-06-23 DIAGNOSIS — D631 Anemia in chronic kidney disease: Secondary | ICD-10-CM | POA: Diagnosis not present

## 2017-06-23 DIAGNOSIS — E875 Hyperkalemia: Secondary | ICD-10-CM | POA: Diagnosis not present

## 2017-06-26 DIAGNOSIS — D509 Iron deficiency anemia, unspecified: Secondary | ICD-10-CM | POA: Diagnosis not present

## 2017-06-26 DIAGNOSIS — N186 End stage renal disease: Secondary | ICD-10-CM | POA: Diagnosis not present

## 2017-06-26 DIAGNOSIS — D631 Anemia in chronic kidney disease: Secondary | ICD-10-CM | POA: Diagnosis not present

## 2017-06-26 DIAGNOSIS — N2581 Secondary hyperparathyroidism of renal origin: Secondary | ICD-10-CM | POA: Diagnosis not present

## 2017-06-26 DIAGNOSIS — E875 Hyperkalemia: Secondary | ICD-10-CM | POA: Diagnosis not present

## 2017-06-28 DIAGNOSIS — D631 Anemia in chronic kidney disease: Secondary | ICD-10-CM | POA: Diagnosis not present

## 2017-06-28 DIAGNOSIS — N2581 Secondary hyperparathyroidism of renal origin: Secondary | ICD-10-CM | POA: Diagnosis not present

## 2017-06-28 DIAGNOSIS — N186 End stage renal disease: Secondary | ICD-10-CM | POA: Diagnosis not present

## 2017-06-28 DIAGNOSIS — D509 Iron deficiency anemia, unspecified: Secondary | ICD-10-CM | POA: Diagnosis not present

## 2017-06-28 DIAGNOSIS — E875 Hyperkalemia: Secondary | ICD-10-CM | POA: Diagnosis not present

## 2017-06-30 DIAGNOSIS — D631 Anemia in chronic kidney disease: Secondary | ICD-10-CM | POA: Diagnosis not present

## 2017-06-30 DIAGNOSIS — D509 Iron deficiency anemia, unspecified: Secondary | ICD-10-CM | POA: Diagnosis not present

## 2017-06-30 DIAGNOSIS — N2581 Secondary hyperparathyroidism of renal origin: Secondary | ICD-10-CM | POA: Diagnosis not present

## 2017-06-30 DIAGNOSIS — N186 End stage renal disease: Secondary | ICD-10-CM | POA: Diagnosis not present

## 2017-06-30 DIAGNOSIS — E875 Hyperkalemia: Secondary | ICD-10-CM | POA: Diagnosis not present

## 2017-07-03 DIAGNOSIS — D509 Iron deficiency anemia, unspecified: Secondary | ICD-10-CM | POA: Diagnosis not present

## 2017-07-03 DIAGNOSIS — D631 Anemia in chronic kidney disease: Secondary | ICD-10-CM | POA: Diagnosis not present

## 2017-07-03 DIAGNOSIS — N2581 Secondary hyperparathyroidism of renal origin: Secondary | ICD-10-CM | POA: Diagnosis not present

## 2017-07-03 DIAGNOSIS — N186 End stage renal disease: Secondary | ICD-10-CM | POA: Diagnosis not present

## 2017-07-03 DIAGNOSIS — E875 Hyperkalemia: Secondary | ICD-10-CM | POA: Diagnosis not present

## 2017-07-05 DIAGNOSIS — N2581 Secondary hyperparathyroidism of renal origin: Secondary | ICD-10-CM | POA: Diagnosis not present

## 2017-07-05 DIAGNOSIS — D631 Anemia in chronic kidney disease: Secondary | ICD-10-CM | POA: Diagnosis not present

## 2017-07-05 DIAGNOSIS — D509 Iron deficiency anemia, unspecified: Secondary | ICD-10-CM | POA: Diagnosis not present

## 2017-07-05 DIAGNOSIS — N186 End stage renal disease: Secondary | ICD-10-CM | POA: Diagnosis not present

## 2017-07-05 DIAGNOSIS — E875 Hyperkalemia: Secondary | ICD-10-CM | POA: Diagnosis not present

## 2017-07-07 DIAGNOSIS — N186 End stage renal disease: Secondary | ICD-10-CM | POA: Diagnosis not present

## 2017-07-07 DIAGNOSIS — D631 Anemia in chronic kidney disease: Secondary | ICD-10-CM | POA: Diagnosis not present

## 2017-07-07 DIAGNOSIS — E875 Hyperkalemia: Secondary | ICD-10-CM | POA: Diagnosis not present

## 2017-07-07 DIAGNOSIS — N2581 Secondary hyperparathyroidism of renal origin: Secondary | ICD-10-CM | POA: Diagnosis not present

## 2017-07-07 DIAGNOSIS — D509 Iron deficiency anemia, unspecified: Secondary | ICD-10-CM | POA: Diagnosis not present

## 2017-07-10 DIAGNOSIS — D509 Iron deficiency anemia, unspecified: Secondary | ICD-10-CM | POA: Diagnosis not present

## 2017-07-10 DIAGNOSIS — N2581 Secondary hyperparathyroidism of renal origin: Secondary | ICD-10-CM | POA: Diagnosis not present

## 2017-07-10 DIAGNOSIS — N186 End stage renal disease: Secondary | ICD-10-CM | POA: Diagnosis not present

## 2017-07-10 DIAGNOSIS — E875 Hyperkalemia: Secondary | ICD-10-CM | POA: Diagnosis not present

## 2017-07-10 DIAGNOSIS — D631 Anemia in chronic kidney disease: Secondary | ICD-10-CM | POA: Diagnosis not present

## 2017-07-12 DIAGNOSIS — D631 Anemia in chronic kidney disease: Secondary | ICD-10-CM | POA: Diagnosis not present

## 2017-07-12 DIAGNOSIS — N2581 Secondary hyperparathyroidism of renal origin: Secondary | ICD-10-CM | POA: Diagnosis not present

## 2017-07-12 DIAGNOSIS — E875 Hyperkalemia: Secondary | ICD-10-CM | POA: Diagnosis not present

## 2017-07-12 DIAGNOSIS — D509 Iron deficiency anemia, unspecified: Secondary | ICD-10-CM | POA: Diagnosis not present

## 2017-07-12 DIAGNOSIS — N186 End stage renal disease: Secondary | ICD-10-CM | POA: Diagnosis not present

## 2017-07-13 DIAGNOSIS — N186 End stage renal disease: Secondary | ICD-10-CM | POA: Diagnosis not present

## 2017-07-13 DIAGNOSIS — N390 Urinary tract infection, site not specified: Secondary | ICD-10-CM | POA: Diagnosis not present

## 2017-07-14 ENCOUNTER — Ambulatory Visit (INDEPENDENT_AMBULATORY_CARE_PROVIDER_SITE_OTHER): Payer: Medicare Other | Admitting: Urgent Care

## 2017-07-14 VITALS — BP 145/87 | HR 93 | Temp 97.6°F | Resp 18 | Ht 72.0 in | Wt 159.6 lb

## 2017-07-14 DIAGNOSIS — N186 End stage renal disease: Secondary | ICD-10-CM

## 2017-07-14 DIAGNOSIS — D509 Iron deficiency anemia, unspecified: Secondary | ICD-10-CM | POA: Diagnosis not present

## 2017-07-14 DIAGNOSIS — I10 Essential (primary) hypertension: Secondary | ICD-10-CM | POA: Diagnosis not present

## 2017-07-14 DIAGNOSIS — D631 Anemia in chronic kidney disease: Secondary | ICD-10-CM | POA: Diagnosis not present

## 2017-07-14 DIAGNOSIS — N2581 Secondary hyperparathyroidism of renal origin: Secondary | ICD-10-CM | POA: Diagnosis not present

## 2017-07-14 DIAGNOSIS — Z992 Dependence on renal dialysis: Secondary | ICD-10-CM | POA: Diagnosis not present

## 2017-07-14 DIAGNOSIS — E875 Hyperkalemia: Secondary | ICD-10-CM | POA: Diagnosis not present

## 2017-07-14 NOTE — Progress Notes (Deleted)
    MRN: [id number] DOB: 07/11/1965  Subjective:   Jeremy Mccoy is a 52 y.o. male presenting for UTI. Patient reported to [facility], was given IM ceftriaxone. He was not given further medications. Patient is on hemodialysis 3 times weekly. Reports ***.  Jeremy Mccoy has a current medication list which includes the following prescription(s): acetaminophen, amlodipine, aspirin, carvedilol, cinacalcet, clonidine, hydroxyzine, levetiracetam, losartan, multivitamin, feeding supplement (nepro carb steady), pantoprazole, polyethylene glycol, ranitidine, and sevelamer carbonate. Patient has No Known Allergies. Jeremy Mccoy  has a past medical history of Anemia, Anxiety, Arthritis, CHF (congestive heart failure) ([place]), COPD (chronic obstructive pulmonary disease) ([place]), Crack cocaine use, Dental caries, Depression, End stage renal disease ([place]) (01/11/2012), ESRD (end stage renal disease) on dialysis ([place]), ESRD (end stage renal disease) on dialysis ([place]), Headache(784.0), Headache(784.0), Hematemesis/vomiting blood, History of blood transfusion (10/2011; 04/2013), HTN (hypertension) (06/12/2012), Renal insufficiency, Seizures ([place]), and Shortness of breath. Also  has a past surgical history that includes AV fistula placement (11/29/2011); Renal biopsy (11/07/2011); Inguinal hernia repair (Bilateral, 1967); and Esophagogastroduodenoscopy (N/A, 05/17/2013).  Objective:   Vitals: BP (!) 145/87   Pulse 93   Temp 97.6 F (36.4 C) (Oral)   Resp 18   Ht 6' (1.829 m)   Wt 159 lb 9.6 oz (72.4 kg)   SpO2 99%   BMI 21.65 kg/m   Physical Exam  No results found for this or any previous visit (from the past 24 hour(s)).  Assessment and Plan :     Jeremy Eagles, PA-C [facility] [phone] 07/14/2017 8:47 AM

## 2017-07-14 NOTE — Progress Notes (Signed)
I was unable to see patient. My previous appointment required an unexpected procedure. Patient was unable to wait due to having an appointment with dialysis clinic.

## 2017-07-14 NOTE — Patient Instructions (Addendum)
Dysuria Dysuria is pain or discomfort while urinating. The pain or discomfort may be felt in the tube that carries urine out of the bladder (urethra) or in the surrounding tissue of the genitals. The pain may also be felt in the groin area, lower abdomen, and lower back. You may have to urinate frequently or have the sudden feeling that you have to urinate (urgency). Dysuria can affect both men and women, but is more common in women. Dysuria can be caused by many different things, including:  Urinary tract infection in women.  Infection of the kidney or bladder.  Kidney stones or bladder stones.  Certain sexually transmitted infections (STIs), such as chlamydia.  Dehydration.  Inflammation of the vagina.  Use of certain medicines.  Use of certain soaps or scented products that cause irritation.  Follow these instructions at home: Watch your dysuria for any changes. The following actions may help to reduce any discomfort you are feeling:  Drink enough fluid to keep your urine clear or pale yellow.  Empty your bladder often. Avoid holding urine for long periods of time.  After a bowel movement or urination, women should cleanse from front to back, using each tissue only once.  Empty your bladder after sexual intercourse.  Take medicines only as directed by your health care provider.  If you were prescribed an antibiotic medicine, finish it all even if you start to feel better.  Avoid caffeine, tea, and alcohol. They can irritate the bladder and make dysuria worse. In men, alcohol may irritate the prostate.  Keep all follow-up visits as directed by your health care provider. This is important.  If you had any tests done to find the cause of dysuria, it is your responsibility to obtain your test results. Ask the lab or department performing the test when and how you will get your results. Talk with your health care provider if you have any questions about your results.  Contact a  health care provider if:  You develop pain in your back or sides.  You have a fever.  You have nausea or vomiting.  You have blood in your urine.  You are not urinating as often as you usually do. Get help right away if:  You pain is severe and not relieved with medicines.  You are unable to hold down any fluids.  You or someone else notices a change in your mental function.  You have a rapid heartbeat at rest.  You have shaking or chills.  You feel extremely weak. This information is not intended to replace advice given to you by your health care provider. Make sure you discuss any questions you have with your health care provider. Document Released: 01/29/2004 Document Revised: 10/08/2015 Document Reviewed: 12/26/2013 Elsevier Interactive Patient Education  2018 [employer].     IF you received an x-ray today, you will receive an invoice from [facility]. Please contact [facility] at [phone] with questions or concerns regarding your invoice.   IF you received labwork today, you will receive an invoice from [place]. Please contact [facility] at [phone] with questions or concerns regarding your invoice.   Our billing staff will not be able to assist you with questions regarding bills from these companies.  You will be contacted with the lab results as soon as they are available. The fastest way to get your results is to activate your My Chart account. Instructions are located on the last page of this paperwork. If you have not heard from  [place] regarding the results in 2 weeks, please contact this office.

## 2017-07-17 DIAGNOSIS — N2581 Secondary hyperparathyroidism of renal origin: Secondary | ICD-10-CM | POA: Diagnosis not present

## 2017-07-17 DIAGNOSIS — E875 Hyperkalemia: Secondary | ICD-10-CM | POA: Diagnosis not present

## 2017-07-17 DIAGNOSIS — N186 End stage renal disease: Secondary | ICD-10-CM | POA: Diagnosis not present

## 2017-07-17 DIAGNOSIS — D509 Iron deficiency anemia, unspecified: Secondary | ICD-10-CM | POA: Diagnosis not present

## 2017-07-17 DIAGNOSIS — D631 Anemia in chronic kidney disease: Secondary | ICD-10-CM | POA: Diagnosis not present

## 2017-07-19 DIAGNOSIS — D509 Iron deficiency anemia, unspecified: Secondary | ICD-10-CM | POA: Diagnosis not present

## 2017-07-19 DIAGNOSIS — N2581 Secondary hyperparathyroidism of renal origin: Secondary | ICD-10-CM | POA: Diagnosis not present

## 2017-07-19 DIAGNOSIS — N186 End stage renal disease: Secondary | ICD-10-CM | POA: Diagnosis not present

## 2017-07-19 DIAGNOSIS — D631 Anemia in chronic kidney disease: Secondary | ICD-10-CM | POA: Diagnosis not present

## 2017-07-19 DIAGNOSIS — E875 Hyperkalemia: Secondary | ICD-10-CM | POA: Diagnosis not present

## 2017-07-21 DIAGNOSIS — N2581 Secondary hyperparathyroidism of renal origin: Secondary | ICD-10-CM | POA: Diagnosis not present

## 2017-07-21 DIAGNOSIS — N186 End stage renal disease: Secondary | ICD-10-CM | POA: Diagnosis not present

## 2017-07-21 DIAGNOSIS — D631 Anemia in chronic kidney disease: Secondary | ICD-10-CM | POA: Diagnosis not present

## 2017-07-21 DIAGNOSIS — E875 Hyperkalemia: Secondary | ICD-10-CM | POA: Diagnosis not present

## 2017-07-21 DIAGNOSIS — D509 Iron deficiency anemia, unspecified: Secondary | ICD-10-CM | POA: Diagnosis not present

## 2017-07-24 DIAGNOSIS — D631 Anemia in chronic kidney disease: Secondary | ICD-10-CM | POA: Diagnosis not present

## 2017-07-24 DIAGNOSIS — N186 End stage renal disease: Secondary | ICD-10-CM | POA: Diagnosis not present

## 2017-07-24 DIAGNOSIS — D509 Iron deficiency anemia, unspecified: Secondary | ICD-10-CM | POA: Diagnosis not present

## 2017-07-24 DIAGNOSIS — E875 Hyperkalemia: Secondary | ICD-10-CM | POA: Diagnosis not present

## 2017-07-24 DIAGNOSIS — N2581 Secondary hyperparathyroidism of renal origin: Secondary | ICD-10-CM | POA: Diagnosis not present

## 2017-07-26 DIAGNOSIS — N2581 Secondary hyperparathyroidism of renal origin: Secondary | ICD-10-CM | POA: Diagnosis not present

## 2017-07-26 DIAGNOSIS — E875 Hyperkalemia: Secondary | ICD-10-CM | POA: Diagnosis not present

## 2017-07-26 DIAGNOSIS — D631 Anemia in chronic kidney disease: Secondary | ICD-10-CM | POA: Diagnosis not present

## 2017-07-26 DIAGNOSIS — N186 End stage renal disease: Secondary | ICD-10-CM | POA: Diagnosis not present

## 2017-07-26 DIAGNOSIS — D509 Iron deficiency anemia, unspecified: Secondary | ICD-10-CM | POA: Diagnosis not present

## 2017-07-28 DIAGNOSIS — E875 Hyperkalemia: Secondary | ICD-10-CM | POA: Diagnosis not present

## 2017-07-28 DIAGNOSIS — D631 Anemia in chronic kidney disease: Secondary | ICD-10-CM | POA: Diagnosis not present

## 2017-07-28 DIAGNOSIS — N2581 Secondary hyperparathyroidism of renal origin: Secondary | ICD-10-CM | POA: Diagnosis not present

## 2017-07-28 DIAGNOSIS — D509 Iron deficiency anemia, unspecified: Secondary | ICD-10-CM | POA: Diagnosis not present

## 2017-07-28 DIAGNOSIS — N186 End stage renal disease: Secondary | ICD-10-CM | POA: Diagnosis not present

## 2017-07-31 DIAGNOSIS — N2581 Secondary hyperparathyroidism of renal origin: Secondary | ICD-10-CM | POA: Diagnosis not present

## 2017-07-31 DIAGNOSIS — D509 Iron deficiency anemia, unspecified: Secondary | ICD-10-CM | POA: Diagnosis not present

## 2017-07-31 DIAGNOSIS — D631 Anemia in chronic kidney disease: Secondary | ICD-10-CM | POA: Diagnosis not present

## 2017-07-31 DIAGNOSIS — E875 Hyperkalemia: Secondary | ICD-10-CM | POA: Diagnosis not present

## 2017-07-31 DIAGNOSIS — N186 End stage renal disease: Secondary | ICD-10-CM | POA: Diagnosis not present

## 2017-08-02 DIAGNOSIS — D509 Iron deficiency anemia, unspecified: Secondary | ICD-10-CM | POA: Diagnosis not present

## 2017-08-02 DIAGNOSIS — E875 Hyperkalemia: Secondary | ICD-10-CM | POA: Diagnosis not present

## 2017-08-02 DIAGNOSIS — D631 Anemia in chronic kidney disease: Secondary | ICD-10-CM | POA: Diagnosis not present

## 2017-08-02 DIAGNOSIS — N186 End stage renal disease: Secondary | ICD-10-CM | POA: Diagnosis not present

## 2017-08-02 DIAGNOSIS — N2581 Secondary hyperparathyroidism of renal origin: Secondary | ICD-10-CM | POA: Diagnosis not present

## 2017-08-04 DIAGNOSIS — N186 End stage renal disease: Secondary | ICD-10-CM | POA: Diagnosis not present

## 2017-08-04 DIAGNOSIS — E875 Hyperkalemia: Secondary | ICD-10-CM | POA: Diagnosis not present

## 2017-08-04 DIAGNOSIS — D631 Anemia in chronic kidney disease: Secondary | ICD-10-CM | POA: Diagnosis not present

## 2017-08-04 DIAGNOSIS — N2581 Secondary hyperparathyroidism of renal origin: Secondary | ICD-10-CM | POA: Diagnosis not present

## 2017-08-04 DIAGNOSIS — D509 Iron deficiency anemia, unspecified: Secondary | ICD-10-CM | POA: Diagnosis not present

## 2017-08-07 DIAGNOSIS — E875 Hyperkalemia: Secondary | ICD-10-CM | POA: Diagnosis not present

## 2017-08-07 DIAGNOSIS — N2581 Secondary hyperparathyroidism of renal origin: Secondary | ICD-10-CM | POA: Diagnosis not present

## 2017-08-07 DIAGNOSIS — N186 End stage renal disease: Secondary | ICD-10-CM | POA: Diagnosis not present

## 2017-08-07 DIAGNOSIS — D631 Anemia in chronic kidney disease: Secondary | ICD-10-CM | POA: Diagnosis not present

## 2017-08-07 DIAGNOSIS — D509 Iron deficiency anemia, unspecified: Secondary | ICD-10-CM | POA: Diagnosis not present

## 2017-08-09 DIAGNOSIS — N186 End stage renal disease: Secondary | ICD-10-CM | POA: Diagnosis not present

## 2017-08-09 DIAGNOSIS — N2581 Secondary hyperparathyroidism of renal origin: Secondary | ICD-10-CM | POA: Diagnosis not present

## 2017-08-09 DIAGNOSIS — D631 Anemia in chronic kidney disease: Secondary | ICD-10-CM | POA: Diagnosis not present

## 2017-08-09 DIAGNOSIS — E875 Hyperkalemia: Secondary | ICD-10-CM | POA: Diagnosis not present

## 2017-08-09 DIAGNOSIS — D509 Iron deficiency anemia, unspecified: Secondary | ICD-10-CM | POA: Diagnosis not present

## 2017-08-11 DIAGNOSIS — D509 Iron deficiency anemia, unspecified: Secondary | ICD-10-CM | POA: Diagnosis not present

## 2017-08-11 DIAGNOSIS — D631 Anemia in chronic kidney disease: Secondary | ICD-10-CM | POA: Diagnosis not present

## 2017-08-11 DIAGNOSIS — N186 End stage renal disease: Secondary | ICD-10-CM | POA: Diagnosis not present

## 2017-08-11 DIAGNOSIS — N2581 Secondary hyperparathyroidism of renal origin: Secondary | ICD-10-CM | POA: Diagnosis not present

## 2017-08-11 DIAGNOSIS — E875 Hyperkalemia: Secondary | ICD-10-CM | POA: Diagnosis not present

## 2017-08-14 DIAGNOSIS — N2581 Secondary hyperparathyroidism of renal origin: Secondary | ICD-10-CM | POA: Diagnosis not present

## 2017-08-14 DIAGNOSIS — D631 Anemia in chronic kidney disease: Secondary | ICD-10-CM | POA: Diagnosis not present

## 2017-08-14 DIAGNOSIS — N186 End stage renal disease: Secondary | ICD-10-CM | POA: Diagnosis not present

## 2017-08-14 DIAGNOSIS — E875 Hyperkalemia: Secondary | ICD-10-CM | POA: Diagnosis not present

## 2017-08-14 DIAGNOSIS — I10 Essential (primary) hypertension: Secondary | ICD-10-CM | POA: Diagnosis not present

## 2017-08-14 DIAGNOSIS — Z992 Dependence on renal dialysis: Secondary | ICD-10-CM | POA: Diagnosis not present

## 2017-08-16 DIAGNOSIS — E875 Hyperkalemia: Secondary | ICD-10-CM | POA: Diagnosis not present

## 2017-08-16 DIAGNOSIS — D631 Anemia in chronic kidney disease: Secondary | ICD-10-CM | POA: Diagnosis not present

## 2017-08-16 DIAGNOSIS — N186 End stage renal disease: Secondary | ICD-10-CM | POA: Diagnosis not present

## 2017-08-16 DIAGNOSIS — N2581 Secondary hyperparathyroidism of renal origin: Secondary | ICD-10-CM | POA: Diagnosis not present

## 2017-08-18 DIAGNOSIS — N2581 Secondary hyperparathyroidism of renal origin: Secondary | ICD-10-CM | POA: Diagnosis not present

## 2017-08-18 DIAGNOSIS — E875 Hyperkalemia: Secondary | ICD-10-CM | POA: Diagnosis not present

## 2017-08-18 DIAGNOSIS — N186 End stage renal disease: Secondary | ICD-10-CM | POA: Diagnosis not present

## 2017-08-18 DIAGNOSIS — D631 Anemia in chronic kidney disease: Secondary | ICD-10-CM | POA: Diagnosis not present

## 2017-08-21 DIAGNOSIS — N2581 Secondary hyperparathyroidism of renal origin: Secondary | ICD-10-CM | POA: Diagnosis not present

## 2017-08-21 DIAGNOSIS — E875 Hyperkalemia: Secondary | ICD-10-CM | POA: Diagnosis not present

## 2017-08-21 DIAGNOSIS — D631 Anemia in chronic kidney disease: Secondary | ICD-10-CM | POA: Diagnosis not present

## 2017-08-21 DIAGNOSIS — N186 End stage renal disease: Secondary | ICD-10-CM | POA: Diagnosis not present

## 2017-08-23 DIAGNOSIS — E875 Hyperkalemia: Secondary | ICD-10-CM | POA: Diagnosis not present

## 2017-08-23 DIAGNOSIS — N2581 Secondary hyperparathyroidism of renal origin: Secondary | ICD-10-CM | POA: Diagnosis not present

## 2017-08-23 DIAGNOSIS — N186 End stage renal disease: Secondary | ICD-10-CM | POA: Diagnosis not present

## 2017-08-23 DIAGNOSIS — D631 Anemia in chronic kidney disease: Secondary | ICD-10-CM | POA: Diagnosis not present

## 2017-08-25 DIAGNOSIS — D631 Anemia in chronic kidney disease: Secondary | ICD-10-CM | POA: Diagnosis not present

## 2017-08-25 DIAGNOSIS — E875 Hyperkalemia: Secondary | ICD-10-CM | POA: Diagnosis not present

## 2017-08-25 DIAGNOSIS — N2581 Secondary hyperparathyroidism of renal origin: Secondary | ICD-10-CM | POA: Diagnosis not present

## 2017-08-25 DIAGNOSIS — N186 End stage renal disease: Secondary | ICD-10-CM | POA: Diagnosis not present

## 2017-08-28 DIAGNOSIS — N2581 Secondary hyperparathyroidism of renal origin: Secondary | ICD-10-CM | POA: Diagnosis not present

## 2017-08-28 DIAGNOSIS — N186 End stage renal disease: Secondary | ICD-10-CM | POA: Diagnosis not present

## 2017-08-28 DIAGNOSIS — D631 Anemia in chronic kidney disease: Secondary | ICD-10-CM | POA: Diagnosis not present

## 2017-08-28 DIAGNOSIS — E875 Hyperkalemia: Secondary | ICD-10-CM | POA: Diagnosis not present

## 2017-08-31 DIAGNOSIS — E875 Hyperkalemia: Secondary | ICD-10-CM | POA: Diagnosis not present

## 2017-08-31 DIAGNOSIS — D631 Anemia in chronic kidney disease: Secondary | ICD-10-CM | POA: Diagnosis not present

## 2017-08-31 DIAGNOSIS — N2581 Secondary hyperparathyroidism of renal origin: Secondary | ICD-10-CM | POA: Diagnosis not present

## 2017-08-31 DIAGNOSIS — N186 End stage renal disease: Secondary | ICD-10-CM | POA: Diagnosis not present

## 2017-09-01 DIAGNOSIS — E875 Hyperkalemia: Secondary | ICD-10-CM | POA: Diagnosis not present

## 2017-09-01 DIAGNOSIS — N186 End stage renal disease: Secondary | ICD-10-CM | POA: Diagnosis not present

## 2017-09-01 DIAGNOSIS — D631 Anemia in chronic kidney disease: Secondary | ICD-10-CM | POA: Diagnosis not present

## 2017-09-01 DIAGNOSIS — N2581 Secondary hyperparathyroidism of renal origin: Secondary | ICD-10-CM | POA: Diagnosis not present

## 2017-09-04 DIAGNOSIS — N2581 Secondary hyperparathyroidism of renal origin: Secondary | ICD-10-CM | POA: Diagnosis not present

## 2017-09-04 DIAGNOSIS — E875 Hyperkalemia: Secondary | ICD-10-CM | POA: Diagnosis not present

## 2017-09-04 DIAGNOSIS — D631 Anemia in chronic kidney disease: Secondary | ICD-10-CM | POA: Diagnosis not present

## 2017-09-04 DIAGNOSIS — N186 End stage renal disease: Secondary | ICD-10-CM | POA: Diagnosis not present

## 2017-09-06 DIAGNOSIS — N186 End stage renal disease: Secondary | ICD-10-CM | POA: Diagnosis not present

## 2017-09-06 DIAGNOSIS — N2581 Secondary hyperparathyroidism of renal origin: Secondary | ICD-10-CM | POA: Diagnosis not present

## 2017-09-06 DIAGNOSIS — E875 Hyperkalemia: Secondary | ICD-10-CM | POA: Diagnosis not present

## 2017-09-06 DIAGNOSIS — D631 Anemia in chronic kidney disease: Secondary | ICD-10-CM | POA: Diagnosis not present

## 2017-09-08 DIAGNOSIS — D631 Anemia in chronic kidney disease: Secondary | ICD-10-CM | POA: Diagnosis not present

## 2017-09-08 DIAGNOSIS — N186 End stage renal disease: Secondary | ICD-10-CM | POA: Diagnosis not present

## 2017-09-08 DIAGNOSIS — N2581 Secondary hyperparathyroidism of renal origin: Secondary | ICD-10-CM | POA: Diagnosis not present

## 2017-09-08 DIAGNOSIS — E875 Hyperkalemia: Secondary | ICD-10-CM | POA: Diagnosis not present

## 2017-09-11 DIAGNOSIS — N2581 Secondary hyperparathyroidism of renal origin: Secondary | ICD-10-CM | POA: Diagnosis not present

## 2017-09-11 DIAGNOSIS — N186 End stage renal disease: Secondary | ICD-10-CM | POA: Diagnosis not present

## 2017-09-11 DIAGNOSIS — D631 Anemia in chronic kidney disease: Secondary | ICD-10-CM | POA: Diagnosis not present

## 2017-09-11 DIAGNOSIS — E875 Hyperkalemia: Secondary | ICD-10-CM | POA: Diagnosis not present

## 2017-09-12 ENCOUNTER — Encounter: Payer: Self-pay | Admitting: Urgent Care

## 2017-09-12 ENCOUNTER — Ambulatory Visit (INDEPENDENT_AMBULATORY_CARE_PROVIDER_SITE_OTHER): Payer: Medicare Other

## 2017-09-12 ENCOUNTER — Ambulatory Visit (INDEPENDENT_AMBULATORY_CARE_PROVIDER_SITE_OTHER): Payer: Medicare Other | Admitting: Urgent Care

## 2017-09-12 VITALS — BP 152/100 | HR 77 | Temp 98.3°F | Resp 16 | Ht 72.0 in | Wt 159.4 lb

## 2017-09-12 DIAGNOSIS — M25572 Pain in left ankle and joints of left foot: Secondary | ICD-10-CM

## 2017-09-12 DIAGNOSIS — M79672 Pain in left foot: Secondary | ICD-10-CM

## 2017-09-12 DIAGNOSIS — R7309 Other abnormal glucose: Secondary | ICD-10-CM

## 2017-09-12 DIAGNOSIS — M25571 Pain in right ankle and joints of right foot: Secondary | ICD-10-CM

## 2017-09-12 DIAGNOSIS — M79671 Pain in right foot: Secondary | ICD-10-CM

## 2017-09-12 DIAGNOSIS — M722 Plantar fascial fibromatosis: Secondary | ICD-10-CM | POA: Diagnosis not present

## 2017-09-12 LAB — GLUCOSE, POCT (MANUAL RESULT ENTRY): POC Glucose: 101 mg/dl — AB (ref 70–99)

## 2017-09-12 MED ORDER — PREDNISONE 20 MG PO TABS: 20.0000 mg | ORAL_TABLET | Freq: Every day | ORAL | 0 refills | Status: DC

## 2017-09-12 NOTE — Patient Instructions (Addendum)
Plantar Fasciitis Plantar fasciitis is a painful foot condition that affects the heel. It occurs when the band of tissue that connects the toes to the heel bone (plantar fascia) becomes irritated. This can happen after exercising too much or doing other repetitive activities (overuse injury). The pain from plantar fasciitis can range from mild irritation to severe pain that makes it difficult for you to walk or move. The pain is usually worse in the morning or after you have been sitting or lying down for a while. What are the causes? This condition may be caused by:  Standing for long periods of time.  Wearing shoes that do not fit.  Doing high-impact activities, including running, aerobics, and ballet.  Being overweight.  Having an abnormal way of walking (gait).  Having tight calf muscles.  Having high arches in your feet.  Starting a new athletic activity.  What are the signs or symptoms? The main symptom of this condition is heel pain. Other symptoms include:  Pain that gets worse after activity or exercise.  Pain that is worse in the morning or after resting.  Pain that goes away after you walk for a few minutes.  How is this diagnosed? This condition may be diagnosed based on your signs and symptoms. Your health care provider will also do a physical exam to check for:  A tender area on the bottom of your foot.  A high arch in your foot.  Pain when you move your foot.  Difficulty moving your foot.  You may also need to have imaging studies to confirm the diagnosis. These can include:  X-rays.  Ultrasound.  MRI.  How is this treated? Treatment for plantar fasciitis depends on the severity of the condition. Your treatment may include:  Rest, ice, and over-the-counter pain medicines to manage your pain.  Exercises to stretch your calves and your plantar fascia.  A splint that holds your foot in a stretched, upward position while you sleep (night  splint).  Physical therapy to relieve symptoms and prevent problems in the future.  Cortisone injections to relieve severe pain.  Extracorporeal shock wave therapy (ESWT) to stimulate damaged plantar fascia with electrical impulses. It is often used as a last resort before surgery.  Surgery, if other treatments have not worked after 12 months.  Follow these instructions at home:  Take medicines only as directed by your health care provider.  Avoid activities that cause pain.  Roll the bottom of your foot over a bag of ice or a bottle of cold water. Do this for 20 minutes, 3-4 times a day.  Perform simple stretches as directed by your health care provider.  Try wearing athletic shoes with air-sole or gel-sole cushions or soft shoe inserts.  Wear a night splint while sleeping, if directed by your health care provider.  Keep all follow-up appointments with your health care provider. How is this prevented?  Do not perform exercises or activities that cause heel pain.  Consider finding low-impact activities if you continue to have problems.  Lose weight if you need to. The best way to prevent plantar fasciitis is to avoid the activities that aggravate your plantar fascia. Contact a health care provider if:  Your symptoms do not go away after treatment with home care measures.  Your pain gets worse.  Your pain affects your ability to move or do your daily activities. This information is not intended to replace advice given to you by your health care provider. Make sure you   discuss any questions you have with your health care provider. Document Released: 01/25/2001 Document Revised: 10/05/2015 Document Reviewed: 03/12/2014 Elsevier Interactive Patient Education  2018 [employer].     IF you received an x-ray today, you will receive an invoice from [facility]. Please contact [facility] at [phone] with questions or concerns regarding your invoice.    IF you received labwork today, you will receive an invoice from [place]. Please contact [facility] at [phone] with questions or concerns regarding your invoice.   Our billing staff will not be able to assist you with questions regarding bills from these companies.  You will be contacted with the lab results as soon as they are available. The fastest way to get your results is to activate your My Chart account. Instructions are located on the last page of this paperwork. If you have not heard from [place] regarding the results in 2 weeks, please contact this office.

## 2017-09-12 NOTE — Progress Notes (Signed)
MRN: [id number] DOB: 10-09-1965  Subjective:   Jeremy Mccoy is a 52 y.o. male presenting for several month history of bilateral heel pain.  Patient was previously trying to stay active and walked as much as he could.  He is now having daily severe heel pain just hours after he wakes up even with minimal walking.  Denies history of plantar fasciitis or osteophytes.  He is on dialysis for end-stage renal disease.  Nicola has a current medication list which includes the following prescription(s): acetaminophen, amlodipine, aspirin, carvedilol, cinacalcet, clonidine, hydroxyzine, losartan, multivitamin, feeding supplement (nepro carb steady), pantoprazole, polyethylene glycol, ranitidine, and sevelamer carbonate. Also has No Known Allergies.  Ladarius  has a past medical history of Anemia, Anxiety, Arthritis, CHF (congestive heart failure) ([place]), COPD (chronic obstructive pulmonary disease) ([place]), Crack cocaine use, Dental caries, Depression, End stage renal disease ([place]) (01/11/2012), ESRD (end stage renal disease) on dialysis ([place]), ESRD (end stage renal disease) on dialysis ([place]), Headache(784.0), Headache(784.0), Hematemesis/vomiting blood, History of blood transfusion (10/2011; 04/2013), HTN (hypertension) (06/12/2012), Renal insufficiency, Seizures ([place]), and Shortness of breath. Also  has a past surgical history that includes AV fistula placement (11/29/2011); Renal biopsy (11/07/2011); Inguinal hernia repair (Bilateral, 1967); and Esophagogastroduodenoscopy (N/A, 05/17/2013).  Objective:   Vitals: BP (!) 152/100   Pulse 77   Temp 98.3 F (36.8 C) (Oral)   Resp 16   Ht 6' (1.829 m)   Wt 159 lb 6.4 oz (72.3 kg)   SpO2 97%   BMI 21.62 kg/m   Physical Exam  Constitutional: He is oriented to person, place, and time. He appears well-developed and well-nourished.  Cardiovascular: Normal rate.  Pulmonary/Chest: Effort normal.  Musculoskeletal:       Right foot: There is normal range of motion,  no tenderness, no bony tenderness, no swelling, normal capillary refill, no crepitus and no deformity.       Left foot: There is normal range of motion, no tenderness, no bony tenderness, no swelling, normal capillary refill and no crepitus.  Neurological: He is alert and oriented to person, place, and time.  Skin: Skin is warm and dry.  Psychiatric: He has a normal mood and affect.   Results for orders placed or performed in visit on 09/12/17 (from the past 24 hour(s))  POCT glucose (manual entry)     Status: Abnormal   Collection Time: 09/12/17  9:20 AM  Result Value Ref Range   POC Glucose 101 (A) 70 - 99 mg/dl    Dg Foot 2 Views Left  Result Date: 09/12/2017 CLINICAL DATA:  Bilateral heel pain EXAM: LEFT FOOT - 2 VIEW COMPARISON:  None. FINDINGS: There is no evidence of fracture or dislocation. There is no evidence of arthropathy or other focal bone abnormality. Soft tissues are unremarkable. Negative for calcaneal spurring. IMPRESSION: Negative. Electronically Signed   By: Franchot Gallo M.D.   On: 09/12/2017 09:39   Dg Foot 2 Views Right  Result Date: 09/12/2017 CLINICAL DATA:  Bilateral heel pain. EXAM: RIGHT FOOT - 2 VIEW COMPARISON:  No recent. FINDINGS: No acute bony or joint abnormality identified. Peripheral vascular calcification noted. No radiopaque foreign body. IMPRESSION: No acute or focal bony abnormality. 2.  Peripheral vascular disease. Electronically Signed   By: Marcello Moores  Register   On: 09/12/2017 09:38   Assessment and Plan :   Heel pain, bilateral - Plan: DG Foot 2 Views Right, DG Foot 2 Views Left  Pain in joints of both feet - Plan: Uric acid, Basic metabolic  panel, Sedimentation Rate, Rheumatoid factor, ANA w/Reflex if Positive, DG Foot 2 Views Right, DG Foot 2 Views Left  Plantar fasciitis, bilateral  Elevated glucose - Plan: POCT glucose (manual entry)  Given patient's end-stage renal disease, will use 20 mg of prednisone for 1 week.  Counseled on management  of plantar fasciitis.  Patient is to follow-up in 1 week.  If his heel pain persist will refer to orthopedist.  Jaynee Eagles, PA-C [facility] [phone] 09/12/2017  9:09 AM

## 2017-09-13 DIAGNOSIS — I10 Essential (primary) hypertension: Secondary | ICD-10-CM | POA: Diagnosis not present

## 2017-09-13 DIAGNOSIS — N186 End stage renal disease: Secondary | ICD-10-CM | POA: Diagnosis not present

## 2017-09-13 DIAGNOSIS — E875 Hyperkalemia: Secondary | ICD-10-CM | POA: Diagnosis not present

## 2017-09-13 DIAGNOSIS — D509 Iron deficiency anemia, unspecified: Secondary | ICD-10-CM | POA: Diagnosis not present

## 2017-09-13 DIAGNOSIS — N2581 Secondary hyperparathyroidism of renal origin: Secondary | ICD-10-CM | POA: Diagnosis not present

## 2017-09-13 DIAGNOSIS — D631 Anemia in chronic kidney disease: Secondary | ICD-10-CM | POA: Diagnosis not present

## 2017-09-13 DIAGNOSIS — Z992 Dependence on renal dialysis: Secondary | ICD-10-CM | POA: Diagnosis not present

## 2017-09-13 LAB — BASIC METABOLIC PANEL
BUN / CREAT RATIO: 4 — AB (ref 9–20)
BUN: 47 mg/dL — ABNORMAL HIGH (ref 6–24)
CO2: 25 mmol/L (ref 20–29)
CREATININE: 10.9 mg/dL — AB (ref 0.76–1.27)
Calcium: 10 mg/dL (ref 8.7–10.2)
Chloride: 92 mmol/L — ABNORMAL LOW (ref 96–106)
GFR calc Af Amer: 6 mL/min/{1.73_m2} — ABNORMAL LOW (ref >59)
GFR, EST NON AFRICAN AMERICAN: 5 mL/min/{1.73_m2} — AB (ref >59)
GLUCOSE: 85 mg/dL (ref 65–99)
Potassium: 5.4 mmol/L — ABNORMAL HIGH (ref 3.5–5.2)
SODIUM: 142 mmol/L (ref 134–144)

## 2017-09-13 LAB — ANA W/REFLEX IF POSITIVE: Anti Nuclear Antibody(ANA): NEGATIVE

## 2017-09-13 LAB — SEDIMENTATION RATE: Sed Rate: 9 mm/hr (ref 0–30)

## 2017-09-13 LAB — RHEUMATOID FACTOR: Rhuematoid fact SerPl-aCnc: <10 IU/mL (ref 0.0–13.9)

## 2017-09-13 LAB — URIC ACID: URIC ACID: 4.4 mg/dL (ref 3.7–8.6)

## 2017-09-15 DIAGNOSIS — E875 Hyperkalemia: Secondary | ICD-10-CM | POA: Diagnosis not present

## 2017-09-15 DIAGNOSIS — D509 Iron deficiency anemia, unspecified: Secondary | ICD-10-CM | POA: Diagnosis not present

## 2017-09-15 DIAGNOSIS — N186 End stage renal disease: Secondary | ICD-10-CM | POA: Diagnosis not present

## 2017-09-15 DIAGNOSIS — D631 Anemia in chronic kidney disease: Secondary | ICD-10-CM | POA: Diagnosis not present

## 2017-09-15 DIAGNOSIS — N2581 Secondary hyperparathyroidism of renal origin: Secondary | ICD-10-CM | POA: Diagnosis not present

## 2017-09-18 DIAGNOSIS — D509 Iron deficiency anemia, unspecified: Secondary | ICD-10-CM | POA: Diagnosis not present

## 2017-09-18 DIAGNOSIS — N2581 Secondary hyperparathyroidism of renal origin: Secondary | ICD-10-CM | POA: Diagnosis not present

## 2017-09-18 DIAGNOSIS — N186 End stage renal disease: Secondary | ICD-10-CM | POA: Diagnosis not present

## 2017-09-18 DIAGNOSIS — E875 Hyperkalemia: Secondary | ICD-10-CM | POA: Diagnosis not present

## 2017-09-18 DIAGNOSIS — D631 Anemia in chronic kidney disease: Secondary | ICD-10-CM | POA: Diagnosis not present

## 2017-09-19 ENCOUNTER — Encounter: Payer: Self-pay | Admitting: Urgent Care

## 2017-09-19 ENCOUNTER — Ambulatory Visit (INDEPENDENT_AMBULATORY_CARE_PROVIDER_SITE_OTHER): Payer: Medicare Other | Admitting: Urgent Care

## 2017-09-19 VITALS — BP 158/93 | HR 91 | Temp 98.0°F | Resp 18 | Ht 72.0 in | Wt 159.0 lb

## 2017-09-19 DIAGNOSIS — M25572 Pain in left ankle and joints of left foot: Secondary | ICD-10-CM | POA: Diagnosis not present

## 2017-09-19 DIAGNOSIS — M25571 Pain in right ankle and joints of right foot: Secondary | ICD-10-CM | POA: Diagnosis not present

## 2017-09-19 DIAGNOSIS — M79671 Pain in right foot: Secondary | ICD-10-CM | POA: Diagnosis not present

## 2017-09-19 DIAGNOSIS — N186 End stage renal disease: Secondary | ICD-10-CM | POA: Diagnosis not present

## 2017-09-19 DIAGNOSIS — M722 Plantar fascial fibromatosis: Secondary | ICD-10-CM | POA: Diagnosis not present

## 2017-09-19 DIAGNOSIS — M79672 Pain in left foot: Secondary | ICD-10-CM | POA: Diagnosis not present

## 2017-09-19 MED ORDER — PREDNISONE 20 MG PO TABS
20.0000 mg | ORAL_TABLET | Freq: Every day | ORAL | 0 refills | Status: AC
Start: 2017-09-19 — End: 2017-09-24

## 2017-09-19 MED ORDER — PREDNISONE 10 MG PO TABS: 10.0000 mg | ORAL_TABLET | Freq: Every day | ORAL | 0 refills | Status: AC

## 2017-09-19 NOTE — Progress Notes (Signed)
    MRN: [id number] DOB: 25-Jul-1965  Subjective:   Jeremy Mccoy is a 52 y.o. male presenting for recheck on his plantar fasciitis.  Reviewed labs with him in clinic.  States that he has done very well with prednisone but still has persistent pain albeit improved.  He would like to try more prednisone.  He does have end-stage renal disease and monitors his blood pressure through the dialysis clinic.  He denies any real change in his blood pressure.  Jeremy Mccoy has a current medication list which includes the following prescription(s): acetaminophen, amlodipine, aspirin, carvedilol, cinacalcet, clonidine, hydroxyzine, losartan, multivitamin, feeding supplement (nepro carb steady), pantoprazole, polyethylene glycol, ranitidine, and sevelamer carbonate. Also has No Known Allergies.  Jeremy Mccoy  has a past medical history of Anemia, Anxiety, Arthritis, CHF (congestive heart failure) ([place]), COPD (chronic obstructive pulmonary disease) ([place]), Crack cocaine use, Dental caries, Depression, End stage renal disease ([place]) (01/11/2012), ESRD (end stage renal disease) on dialysis ([place]), ESRD (end stage renal disease) on dialysis ([place]), Headache(784.0), Headache(784.0), Hematemesis/vomiting blood, History of blood transfusion (10/2011; 04/2013), HTN (hypertension) (06/12/2012), Renal insufficiency, Seizures ([place]), and Shortness of breath. Also  has a past surgical history that includes AV fistula placement (11/29/2011); Renal biopsy (11/07/2011); Inguinal hernia repair (Bilateral, 1967); and Esophagogastroduodenoscopy (N/A, 05/17/2013).  Objective:   Vitals: BP (!) 158/93   Pulse 91   Temp 98 F (36.7 C) (Oral)   Resp 18   Ht 6' (1.829 m)   Wt 159 lb (72.1 kg)   SpO2 99%   BMI 21.56 kg/m   BP Readings from Last 3 Encounters:  09/19/17 (!) 158/93  09/12/17 (!) 152/100  07/14/17 (!) 145/87    Physical Exam  Constitutional: He is oriented to person, place, and time. He appears well-developed and well-nourished.    Cardiovascular: Normal rate.  Pulmonary/Chest: Effort normal.  Neurological: He is alert and oriented to person, place, and time.   Assessment and Plan :   Heel pain, bilateral  Plantar fasciitis, bilateral  Pain in joints of both feet  End stage renal disease ([place])  Patient is unable to take NSAIDs and Tylenol does not help patient.  I counseled that we cannot use prednisone indefinitely.  Discussed potential for side effects with prolonged use of prednisone.  He would like to try one more course and plans on setting of physical therapy for his plantar fasciitis.  We will use a 10-day course of 20 mg of prednisone for 5 days followed by 10 mg for him another 5 days.  Follow-up as needed.  Jaynee Eagles, PA-C [facility] [phone] 09/19/2017  8:41 AM

## 2017-09-19 NOTE — Patient Instructions (Addendum)
For a consult of physical therapy, please contact [place].  [phone] Please call Orvan July, Office Manager to set up an appointment     Plantar Fasciitis Plantar fasciitis is a painful foot condition that affects the heel. It occurs when the band of tissue that connects the toes to the heel bone (plantar fascia) becomes irritated. This can happen after exercising too much or doing other repetitive activities (overuse injury). The pain from plantar fasciitis can range from mild irritation to severe pain that makes it difficult for you to walk or move. The pain is usually worse in the morning or after you have been sitting or lying down for a while. What are the causes? This condition may be caused by:  Standing for long periods of time.  Wearing shoes that do not fit.  Doing high-impact activities, including running, aerobics, and ballet.  Being overweight.  Having an abnormal way of walking (gait).  Having tight calf muscles.  Having high arches in your feet.  Starting a new athletic activity.  What are the signs or symptoms? The main symptom of this condition is heel pain. Other symptoms include:  Pain that gets worse after activity or exercise.  Pain that is worse in the morning or after resting.  Pain that goes away after you walk for a few minutes.  How is this diagnosed? This condition may be diagnosed based on your signs and symptoms. Your health care provider will also do a physical exam to check for:  A tender area on the bottom of your foot.  A high arch in your foot.  Pain when you move your foot.  Difficulty moving your foot.  You may also need to have imaging studies to confirm the diagnosis. These can include:  X-rays.  Ultrasound.  MRI.  How is this treated? Treatment for plantar fasciitis depends on the severity of the condition. Your treatment may include:  Rest, ice, and over-the-counter pain medicines to manage  your pain.  Exercises to stretch your calves and your plantar fascia.  A splint that holds your foot in a stretched, upward position while you sleep (night splint).  Physical therapy to relieve symptoms and prevent problems in the future.  Cortisone injections to relieve severe pain.  Extracorporeal shock wave therapy (ESWT) to stimulate damaged plantar fascia with electrical impulses. It is often used as a last resort before surgery.  Surgery, if other treatments have not worked after 12 months.  Follow these instructions at home:  Take medicines only as directed by your health care provider.  Avoid activities that cause pain.  Roll the bottom of your foot over a bag of ice or a bottle of cold water. Do this for 20 minutes, 3-4 times a day.  Perform simple stretches as directed by your health care provider.  Try wearing athletic shoes with air-sole or gel-sole cushions or soft shoe inserts.  Wear a night splint while sleeping, if directed by your health care provider.  Keep all follow-up appointments with your health care provider. How is this prevented?  Do not perform exercises or activities that cause heel pain.  Consider finding low-impact activities if you continue to have problems.  Lose weight if you need to. The best way to prevent plantar fasciitis is to avoid the activities that aggravate your plantar fascia. Contact a health care provider if:  Your symptoms do not go away after treatment with home care measures.  Your pain gets worse.  Your pain affects your  ability to move or do your daily activities. This information is not intended to replace advice given to you by your health care provider. Make sure you discuss any questions you have with your health care provider. Document Released: 01/25/2001 Document Revised: 10/05/2015 Document Reviewed: 03/12/2014 Elsevier Interactive Patient Education  2018 [employer].     IF you received an x-ray today,  you will receive an invoice from [facility]. Please contact [facility] at [phone] with questions or concerns regarding your invoice.   IF you received labwork today, you will receive an invoice from [place]. Please contact [facility] at [phone] with questions or concerns regarding your invoice.   Our billing staff will not be able to assist you with questions regarding bills from these companies.  You will be contacted with the lab results as soon as they are available. The fastest way to get your results is to activate your My Chart account. Instructions are located on the last page of this paperwork. If you have not heard from [place] regarding the results in 2 weeks, please contact this office.

## 2017-09-20 DIAGNOSIS — N2581 Secondary hyperparathyroidism of renal origin: Secondary | ICD-10-CM | POA: Diagnosis not present

## 2017-09-20 DIAGNOSIS — N186 End stage renal disease: Secondary | ICD-10-CM | POA: Diagnosis not present

## 2017-09-20 DIAGNOSIS — D509 Iron deficiency anemia, unspecified: Secondary | ICD-10-CM | POA: Diagnosis not present

## 2017-09-20 DIAGNOSIS — D631 Anemia in chronic kidney disease: Secondary | ICD-10-CM | POA: Diagnosis not present

## 2017-09-20 DIAGNOSIS — E875 Hyperkalemia: Secondary | ICD-10-CM | POA: Diagnosis not present

## 2017-09-22 DIAGNOSIS — E875 Hyperkalemia: Secondary | ICD-10-CM | POA: Diagnosis not present

## 2017-09-22 DIAGNOSIS — D509 Iron deficiency anemia, unspecified: Secondary | ICD-10-CM | POA: Diagnosis not present

## 2017-09-22 DIAGNOSIS — D631 Anemia in chronic kidney disease: Secondary | ICD-10-CM | POA: Diagnosis not present

## 2017-09-22 DIAGNOSIS — N186 End stage renal disease: Secondary | ICD-10-CM | POA: Diagnosis not present

## 2017-09-22 DIAGNOSIS — N2581 Secondary hyperparathyroidism of renal origin: Secondary | ICD-10-CM | POA: Diagnosis not present

## 2017-09-25 DIAGNOSIS — N186 End stage renal disease: Secondary | ICD-10-CM | POA: Diagnosis not present

## 2017-09-25 DIAGNOSIS — D631 Anemia in chronic kidney disease: Secondary | ICD-10-CM | POA: Diagnosis not present

## 2017-09-25 DIAGNOSIS — N2581 Secondary hyperparathyroidism of renal origin: Secondary | ICD-10-CM | POA: Diagnosis not present

## 2017-09-25 DIAGNOSIS — D509 Iron deficiency anemia, unspecified: Secondary | ICD-10-CM | POA: Diagnosis not present

## 2017-09-25 DIAGNOSIS — E875 Hyperkalemia: Secondary | ICD-10-CM | POA: Diagnosis not present

## 2017-09-27 DIAGNOSIS — N186 End stage renal disease: Secondary | ICD-10-CM | POA: Diagnosis not present

## 2017-09-27 DIAGNOSIS — D509 Iron deficiency anemia, unspecified: Secondary | ICD-10-CM | POA: Diagnosis not present

## 2017-09-27 DIAGNOSIS — D631 Anemia in chronic kidney disease: Secondary | ICD-10-CM | POA: Diagnosis not present

## 2017-09-27 DIAGNOSIS — E875 Hyperkalemia: Secondary | ICD-10-CM | POA: Diagnosis not present

## 2017-09-27 DIAGNOSIS — N2581 Secondary hyperparathyroidism of renal origin: Secondary | ICD-10-CM | POA: Diagnosis not present

## 2017-09-29 DIAGNOSIS — E875 Hyperkalemia: Secondary | ICD-10-CM | POA: Diagnosis not present

## 2017-09-29 DIAGNOSIS — N186 End stage renal disease: Secondary | ICD-10-CM | POA: Diagnosis not present

## 2017-09-29 DIAGNOSIS — D509 Iron deficiency anemia, unspecified: Secondary | ICD-10-CM | POA: Diagnosis not present

## 2017-09-29 DIAGNOSIS — N2581 Secondary hyperparathyroidism of renal origin: Secondary | ICD-10-CM | POA: Diagnosis not present

## 2017-09-29 DIAGNOSIS — D631 Anemia in chronic kidney disease: Secondary | ICD-10-CM | POA: Diagnosis not present

## 2017-10-02 DIAGNOSIS — N186 End stage renal disease: Secondary | ICD-10-CM | POA: Diagnosis not present

## 2017-10-02 DIAGNOSIS — D509 Iron deficiency anemia, unspecified: Secondary | ICD-10-CM | POA: Diagnosis not present

## 2017-10-02 DIAGNOSIS — D631 Anemia in chronic kidney disease: Secondary | ICD-10-CM | POA: Diagnosis not present

## 2017-10-02 DIAGNOSIS — E875 Hyperkalemia: Secondary | ICD-10-CM | POA: Diagnosis not present

## 2017-10-02 DIAGNOSIS — N2581 Secondary hyperparathyroidism of renal origin: Secondary | ICD-10-CM | POA: Diagnosis not present

## 2017-10-04 DIAGNOSIS — E875 Hyperkalemia: Secondary | ICD-10-CM | POA: Diagnosis not present

## 2017-10-04 DIAGNOSIS — N2581 Secondary hyperparathyroidism of renal origin: Secondary | ICD-10-CM | POA: Diagnosis not present

## 2017-10-04 DIAGNOSIS — N186 End stage renal disease: Secondary | ICD-10-CM | POA: Diagnosis not present

## 2017-10-04 DIAGNOSIS — D509 Iron deficiency anemia, unspecified: Secondary | ICD-10-CM | POA: Diagnosis not present

## 2017-10-04 DIAGNOSIS — D631 Anemia in chronic kidney disease: Secondary | ICD-10-CM | POA: Diagnosis not present

## 2017-10-06 DIAGNOSIS — N2581 Secondary hyperparathyroidism of renal origin: Secondary | ICD-10-CM | POA: Diagnosis not present

## 2017-10-06 DIAGNOSIS — D631 Anemia in chronic kidney disease: Secondary | ICD-10-CM | POA: Diagnosis not present

## 2017-10-06 DIAGNOSIS — D509 Iron deficiency anemia, unspecified: Secondary | ICD-10-CM | POA: Diagnosis not present

## 2017-10-06 DIAGNOSIS — N186 End stage renal disease: Secondary | ICD-10-CM | POA: Diagnosis not present

## 2017-10-06 DIAGNOSIS — E875 Hyperkalemia: Secondary | ICD-10-CM | POA: Diagnosis not present

## 2017-10-09 DIAGNOSIS — N2581 Secondary hyperparathyroidism of renal origin: Secondary | ICD-10-CM | POA: Diagnosis not present

## 2017-10-09 DIAGNOSIS — D509 Iron deficiency anemia, unspecified: Secondary | ICD-10-CM | POA: Diagnosis not present

## 2017-10-09 DIAGNOSIS — N186 End stage renal disease: Secondary | ICD-10-CM | POA: Diagnosis not present

## 2017-10-09 DIAGNOSIS — E875 Hyperkalemia: Secondary | ICD-10-CM | POA: Diagnosis not present

## 2017-10-09 DIAGNOSIS — D631 Anemia in chronic kidney disease: Secondary | ICD-10-CM | POA: Diagnosis not present

## 2017-10-12 DIAGNOSIS — N186 End stage renal disease: Secondary | ICD-10-CM | POA: Diagnosis not present

## 2017-10-12 DIAGNOSIS — D631 Anemia in chronic kidney disease: Secondary | ICD-10-CM | POA: Diagnosis not present

## 2017-10-12 DIAGNOSIS — N2581 Secondary hyperparathyroidism of renal origin: Secondary | ICD-10-CM | POA: Diagnosis not present

## 2017-10-12 DIAGNOSIS — D509 Iron deficiency anemia, unspecified: Secondary | ICD-10-CM | POA: Diagnosis not present

## 2017-10-12 DIAGNOSIS — E875 Hyperkalemia: Secondary | ICD-10-CM | POA: Diagnosis not present

## 2017-10-13 DIAGNOSIS — D509 Iron deficiency anemia, unspecified: Secondary | ICD-10-CM | POA: Diagnosis not present

## 2017-10-13 DIAGNOSIS — N186 End stage renal disease: Secondary | ICD-10-CM | POA: Diagnosis not present

## 2017-10-13 DIAGNOSIS — D631 Anemia in chronic kidney disease: Secondary | ICD-10-CM | POA: Diagnosis not present

## 2017-10-13 DIAGNOSIS — N2581 Secondary hyperparathyroidism of renal origin: Secondary | ICD-10-CM | POA: Diagnosis not present

## 2017-10-13 DIAGNOSIS — E875 Hyperkalemia: Secondary | ICD-10-CM | POA: Diagnosis not present

## 2017-10-14 DIAGNOSIS — I10 Essential (primary) hypertension: Secondary | ICD-10-CM | POA: Diagnosis not present

## 2017-10-14 DIAGNOSIS — Z992 Dependence on renal dialysis: Secondary | ICD-10-CM | POA: Diagnosis not present

## 2017-10-14 DIAGNOSIS — N186 End stage renal disease: Secondary | ICD-10-CM | POA: Diagnosis not present

## 2017-10-16 DIAGNOSIS — N186 End stage renal disease: Secondary | ICD-10-CM | POA: Diagnosis not present

## 2017-10-16 DIAGNOSIS — D509 Iron deficiency anemia, unspecified: Secondary | ICD-10-CM | POA: Diagnosis not present

## 2017-10-16 DIAGNOSIS — E875 Hyperkalemia: Secondary | ICD-10-CM | POA: Diagnosis not present

## 2017-10-16 DIAGNOSIS — N2581 Secondary hyperparathyroidism of renal origin: Secondary | ICD-10-CM | POA: Diagnosis not present

## 2017-10-16 DIAGNOSIS — D631 Anemia in chronic kidney disease: Secondary | ICD-10-CM | POA: Diagnosis not present

## 2017-10-18 DIAGNOSIS — D631 Anemia in chronic kidney disease: Secondary | ICD-10-CM | POA: Diagnosis not present

## 2017-10-18 DIAGNOSIS — N2581 Secondary hyperparathyroidism of renal origin: Secondary | ICD-10-CM | POA: Diagnosis not present

## 2017-10-18 DIAGNOSIS — E875 Hyperkalemia: Secondary | ICD-10-CM | POA: Diagnosis not present

## 2017-10-18 DIAGNOSIS — N186 End stage renal disease: Secondary | ICD-10-CM | POA: Diagnosis not present

## 2017-10-18 DIAGNOSIS — D509 Iron deficiency anemia, unspecified: Secondary | ICD-10-CM | POA: Diagnosis not present

## 2017-10-20 ENCOUNTER — Ambulatory Visit (HOSPITAL_COMMUNITY)
Admission: RE | Admit: 2017-10-20 | Discharge: 2017-10-20 | Disposition: A | Discharge status: Home / Self Care | Payer: Medicare Other | Source: Ambulatory Visit | Attending: Nurse Practitioner | Admitting: Nurse Practitioner

## 2017-10-20 ENCOUNTER — Other Ambulatory Visit (HOSPITAL_COMMUNITY): Payer: Self-pay | Admitting: Nephrology

## 2017-10-20 ENCOUNTER — Other Ambulatory Visit (HOSPITAL_COMMUNITY): Payer: Self-pay | Admitting: Nurse Practitioner

## 2017-10-20 ENCOUNTER — Encounter (HOSPITAL_COMMUNITY): Payer: Self-pay | Admitting: *Deleted

## 2017-10-20 ENCOUNTER — Non-Acute Institutional Stay (HOSPITAL_COMMUNITY)
Admission: EM | Admit: 2017-10-20 | Discharge: 2017-10-21 | Disposition: A | Discharge status: Home / Self Care | Payer: Medicare Other | Attending: Emergency Medicine | Admitting: Emergency Medicine

## 2017-10-20 ENCOUNTER — Other Ambulatory Visit: Payer: Self-pay

## 2017-10-20 ENCOUNTER — Emergency Department (HOSPITAL_COMMUNITY)
Admission: EM | Admit: 2017-10-20 | Discharge: 2017-10-20 | Disposition: A | Discharge status: Home / Self Care | Payer: Medicare Other | Attending: Emergency Medicine | Admitting: Emergency Medicine

## 2017-10-20 DIAGNOSIS — N186 End stage renal disease: Secondary | ICD-10-CM | POA: Diagnosis not present

## 2017-10-20 DIAGNOSIS — I132 Hypertensive heart and chronic kidney disease with heart failure and with stage 5 chronic kidney disease, or end stage renal disease: Secondary | ICD-10-CM | POA: Insufficient documentation

## 2017-10-20 DIAGNOSIS — E875 Hyperkalemia: Secondary | ICD-10-CM

## 2017-10-20 DIAGNOSIS — J449 Chronic obstructive pulmonary disease, unspecified: Secondary | ICD-10-CM | POA: Insufficient documentation

## 2017-10-20 DIAGNOSIS — Z992 Dependence on renal dialysis: Secondary | ICD-10-CM | POA: Diagnosis not present

## 2017-10-20 DIAGNOSIS — T82590A Other mechanical complication of surgically created arteriovenous fistula, initial encounter: Secondary | ICD-10-CM | POA: Insufficient documentation

## 2017-10-20 DIAGNOSIS — I5032 Chronic diastolic (congestive) heart failure: Secondary | ICD-10-CM | POA: Insufficient documentation

## 2017-10-20 DIAGNOSIS — Z4901 Encounter for fitting and adjustment of extracorporeal dialysis catheter: Secondary | ICD-10-CM | POA: Diagnosis not present

## 2017-10-20 DIAGNOSIS — F17210 Nicotine dependence, cigarettes, uncomplicated: Secondary | ICD-10-CM | POA: Insufficient documentation

## 2017-10-20 DIAGNOSIS — T82868D Thrombosis of vascular prosthetic devices, implants and grafts, subsequent encounter: Secondary | ICD-10-CM | POA: Insufficient documentation

## 2017-10-20 DIAGNOSIS — T829XXA Unspecified complication of cardiac and vascular prosthetic device, implant and graft, initial encounter: Secondary | ICD-10-CM

## 2017-10-20 DIAGNOSIS — G40909 Epilepsy, unspecified, not intractable, without status epilepticus: Secondary | ICD-10-CM | POA: Diagnosis not present

## 2017-10-20 DIAGNOSIS — Y832 Surgical operation with anastomosis, bypass or graft as the cause of abnormal reaction of the patient, or of later complication, without mention of misadventure at the time of the procedure: Secondary | ICD-10-CM | POA: Insufficient documentation

## 2017-10-20 DIAGNOSIS — I10 Essential (primary) hypertension: Secondary | ICD-10-CM | POA: Diagnosis not present

## 2017-10-20 HISTORY — PX: IR FLUORO GUIDE CV LINE RIGHT: IMG2283

## 2017-10-20 HISTORY — PX: IR US GUIDE VASC ACCESS RIGHT: IMG2390

## 2017-10-20 LAB — BASIC METABOLIC PANEL
ANION GAP: 17 — AB (ref 5–15)
BUN: 72 mg/dL — ABNORMAL HIGH (ref 6–20)
CHLORIDE: 93 mmol/L — AB (ref 101–111)
CO2: 28 mmol/L (ref 22–32)
Calcium: 9 mg/dL (ref 8.9–10.3)
Creatinine, Ser: 15.54 mg/dL — ABNORMAL HIGH (ref 0.61–1.24)
GFR calc Af Amer: 4 mL/min — ABNORMAL LOW (ref >60)
GFR, EST NON AFRICAN AMERICAN: 3 mL/min — AB (ref >60)
Glucose, Bld: 123 mg/dL — ABNORMAL HIGH (ref 65–99)
POTASSIUM: 5.7 mmol/L — AB (ref 3.5–5.1)
Sodium: 138 mmol/L (ref 135–145)

## 2017-10-20 LAB — CBC WITH DIFFERENTIAL/PLATELET
ABS IMMATURE GRANULOCYTES: 0 10*3/uL (ref 0.0–0.1)
BASOS ABS: 0 10*3/uL (ref 0.0–0.1)
Basophils Relative: 0 %
Eosinophils Absolute: 0.1 10*3/uL (ref 0.0–0.7)
Eosinophils Relative: 1 %
HCT: 34.7 % — ABNORMAL LOW (ref 39.0–52.0)
HEMOGLOBIN: 10.9 g/dL — AB (ref 13.0–17.0)
Immature Granulocytes: 1 %
LYMPHS PCT: 21 %
Lymphs Abs: 0.8 10*3/uL (ref 0.7–4.0)
MCH: 32 pg (ref 26.0–34.0)
MCHC: 31.4 g/dL (ref 30.0–36.0)
MCV: 101.8 fL — ABNORMAL HIGH (ref 78.0–100.0)
MONO ABS: 0.4 10*3/uL (ref 0.1–1.0)
Monocytes Relative: 10 %
NEUTROS ABS: 2.6 10*3/uL (ref 1.7–7.7)
Neutrophils Relative %: 67 %
Platelets: 158 10*3/uL (ref 150–400)
RBC: 3.41 MIL/uL — AB (ref 4.22–5.81)
RDW: 14.2 % (ref 11.5–15.5)
WBC: 3.9 10*3/uL — ABNORMAL LOW (ref 4.0–10.5)

## 2017-10-20 MED ORDER — LIDOCAINE HCL 1 % IJ SOLN
INTRAMUSCULAR | Status: AC
  Filled 2017-10-20: qty 20

## 2017-10-20 MED ORDER — HEPARIN SODIUM (PORCINE) 1000 UNIT/ML IJ SOLN
INTRAMUSCULAR | Status: AC
  Filled 2017-10-20: qty 1

## 2017-10-20 MED ORDER — CHLORHEXIDINE GLUCONATE CLOTH 2 % EX PADS: 6.0000 | MEDICATED_PAD | Freq: Every day | CUTANEOUS | Status: DC

## 2017-10-20 MED ORDER — LIDOCAINE HCL (PF) 1 % IJ SOLN
INTRAMUSCULAR | Status: DC | PRN
  Administered 2017-10-20: 5 mL

## 2017-10-20 NOTE — ED Notes (Signed)
Re-paged Nephrology

## 2017-10-20 NOTE — ED Provider Notes (Signed)
Emergency Department Provider Note   I have reviewed the triage vital signs and the nursing notes.   HISTORY  Chief Complaint needs dialysis   HPI Jeremy Mccoy is a 52 y.o. male with PMH of ESRD on (MWF HD), COPD, HTN, and seizures presents to the emergency department for evaluation of malfunctioning left forearm fistula.  The patient went to dialysis today where they were unable to use the fistula.  He came to the emergency department where a hemodialysis catheter was placed in the right chest.  Labs drawn earlier today show elevated potassium.  Patient has no complaints at this time. No radiation of symptoms or modifying factors.   Past Medical History:  Diagnosis Date  . Anemia   . Anxiety   . Arthritis    "qwhere" (05/15/2013)  . CHF (congestive heart failure) ([place])   . COPD (chronic obstructive pulmonary disease) ([place])   . Crack cocaine use   . Dental caries   . Depression   . End stage renal disease ([place]) 01/11/2012   Patient presented 11/04/11 to hospital with CP, wt loss and N/V. Creat was 10, admitted. 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 ANA was + 1:80, +MPO and +pr3 Ab's (ANCA). Received plasmapheresis x 7, IV cytoxan and pred taper. First HD was 11/17/11. 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 Pt d/c'd to do outpt HD and may have gotten 1-2 additional Cytoxan as OP, but this was stopped eventually since renal function didn't recover. Now gets HD TTS schedule at [facility].  L forearm AVF 11/19/11 by Dr. Scot Dock is current access.    Marland Kitchen ESRD (end stage renal disease) on dialysis [facility])    TTS: [address] [place] (05/15/2013)  . ESRD (end stage renal disease) on dialysis ([place])   . Headache(784.0)    "get it from going to dialysis; when they get real bad I come to hospital" (05/15/2013)  . Headache(784.0)   . Hematemesis/vomiting blood    . History of blood transfusion 10/2011; 04/2013  . HTN (hypertension) 06/12/2012  . Renal insufficiency   . Seizures ([place])   . Shortness of breath    "just when I have too much potassium is too high" (05/15/2013)    Patient Active Problem List   Diagnosis Date Noted  . ESRD (end stage renal disease) on dialysis ([place]) 10/20/2017  . Plantar fasciitis, bilateral 09/12/2017  . Hyperkalemia 02/23/2016  . Coughing blood   . Vasculitis ([place])   . Lung crackles   . Hemoptysis 07/25/2015  . Anemia 02/20/2014  . Symptomatic anemia 02/20/2014  . PAF (paroxysmal atrial fibrillation) ([place]) 01/31/2014  . Chronic diastolic congestive heart failure ([place]) 01/31/2014  . COPD (chronic obstructive pulmonary disease) ([place]) 01/31/2014  . Thrombocytopenia ([place]) 01/23/2014  . Unspecified constipation 01/23/2014  . High anion gap metabolic acidosis 93/81/8299  . ESRD on hemodialysis ([place]) 11/05/2013  . Seizure disorder ([place]) 11/04/2013  . Protein-calorie malnutrition, severe ([place]) 07/12/2013  . Respiratory failure ([place]) 07/10/2013  . Status epilepticus ([place]) 07/10/2013  . Essential hypertension 07/10/2013  . Acute blood loss anemia 05/15/2013  . Hematemesis 05/15/2013  . Volume excess 09/20/2012  . Abnormal EKG 09/20/2012  . Drug abuse ([place]) 09/20/2012  . Elevated troponin 09/20/2012  . Anemia in chronic kidney disease 09/20/2012  . Pulmonary edema 06/12/2012  . Cocaine abuse ([place]) 06/12/2012  . End stage renal disease ([place])  01/11/2012  . Tobacco abuse 11/04/2011  . Dental caries 11/04/2011  . Color blindness 11/04/2011    Past Surgical History:  Procedure Laterality Date  . AV FISTULA PLACEMENT  11/29/2011   Procedure: ARTERIOVENOUS (AV) FISTULA CREATION;  Surgeon: Angelia Mould, MD;  Location: [facility] OR;  Service: Vascular;  Laterality: Left;  . ESOPHAGOGASTRODUODENOSCOPY N/A 05/17/2013   Procedure: ESOPHAGOGASTRODUODENOSCOPY (EGD);  Surgeon: Beryle Beams, MD;  Location[facility] ENDOSCOPY;   Service: Endoscopy;  Laterality: N/A;  . INGUINAL HERNIA REPAIR Bilateral 1967  . IR FLUORO GUIDE CV LINE RIGHT  10/20/2017  . IR US GUIDE VASC ACCESS RIGHT  10/20/2017  . RENAL BIOPSY  11/07/2011      Allergies Patient has no known allergies.  Family History  Problem Relation Age of Onset  . Heart attack Father     Social History Social History   Tobacco Use  . Smoking status: Current Every Day Smoker    Packs/day: 1.00    Years: 35.00    Pack years: 35.00    Types: Cigars, Cigarettes  . Smokeless tobacco: Never Used  Substance Use Topics  . Alcohol use: No    Comment: 05/15/2013 "aien't drank in ~ 25 yrs"  . Drug use: Yes    Types: Marijuana, Cocaine    Comment: Precription drugs (e.g. percocet) "I take what I have to to controll my constant pain" (05/15/2013)    Review of Systems  Constitutional: No fever/chills Eyes: No visual changes. ENT: No sore throat. Cardiovascular: Denies chest pain. Positive malfunctioning fistula.  Respiratory: Denies shortness of breath. Gastrointestinal: No abdominal pain.  No nausea, no vomiting.  No diarrhea.  No constipation. Genitourinary: Negative for dysuria. Musculoskeletal: Negative for back pain. Skin: Negative for rash. Neurological: Negative for headaches, focal weakness or numbness.  10-point ROS otherwise negative.  ____________________________________________   PHYSICAL EXAM:  VITAL SIGNS: ED Triage Vitals  Enc Vitals Group     BP 10/20/17 1643 (!) 156/111     Pulse Rate 10/20/17 1643 72     Resp 10/20/17 1643 16     Temp 10/20/17 1643 98.2 F (36.8 C)     Temp Source 10/20/17 1848 Oral     SpO2 10/20/17 1643 99 %     Weight 10/20/17 1646 155 lb (70.3 kg)     Height 10/20/17 1646 6' (1.829 m)     Pain Score 10/20/17 1645 0   Constitutional: Alert and oriented. Well appearing and in no acute distress. Eyes: Conjunctivae are normal.  Head: Atraumatic. Nose: No congestion/rhinnorhea. Mouth/Throat: Mucous  membranes are moist.   Neck: No stridor.   Cardiovascular: Normal rate, regular rhythm. Good peripheral circulation. Grossly normal heart sounds. Well-appearing HD cath over right chest.  Respiratory: Normal respiratory effort.  No retractions. Lungs CTAB. Gastrointestinal: Soft and nontender. No distention.  Musculoskeletal: No lower extremity tenderness nor edema. No gross deformities of extremities. Neurologic:  Normal speech and language. No gross focal neurologic deficits are appreciated.  Skin:  Skin is warm, dry and intact. No rash noted. ____________________________________________  EKG  NSR. Normal intervals. Normal T waves. Narrow QRS. No ST elevation or depression.  ____________________________________________  RADIOLOGY  Ir Fluoro Guide Cv Line Right  Result Date: 10/20/2017 INDICATION: 52 year old with end-stage renal disease. Patient has a thrombosed left wrist fistula and needs hemodialysis. EXAM: FLUOROSCOPIC AND ULTRASOUND GUIDED PLACEMENT OF A NON-TUNNELED DIALYSIS CATHETER Physician: Stephan Minister. Henn, MD MEDICATIONS: None ANESTHESIA/SEDATION: None FLUOROSCOPY TIME:  Fluoroscopy Time: 18 seconds, 1 mGy COMPLICATIONS: None  immediate. PROCEDURE: Informed consent was obtained for catheter placement. The patient was placed supine on the interventional table. Ultrasound confirmed a patent right internal jugular vein. Ultrasound images were obtained for documentation. The right side of the neck was prepped and draped in a sterile fashion. The right side of the neck neck was anesthetized with 1% lidocaine. Maximal barrier sterile technique was utilized including caps, mask, sterile gowns, sterile gloves, sterile drape, hand hygiene and skin antiseptic. A small incision was made with #11 blade scalpel. A 21 gauge needle directed into the right internal jugular vein with ultrasound guidance. A micropuncture dilator set was placed. A 20 cm Mahurkar catheter was selected. The catheter was  advanced over a wire and positioned at the superior cavoatrial junction. Fluoroscopic images were obtained for documentation. Both dialysis lumens were found to aspirate and flush well. The proper amount of heparin was flushed in both lumens. The central venous lumen was flushed with normal saline. Catheter was sutured to skin. FINDINGS: Catheter tip at the superior cavoatrial junction. IMPRESSION: Successful placement of a right jugular non-tunneled dialysis catheter using ultrasound and fluoroscopic guidance. Electronically Signed   By: Markus Daft M.D.   On: 10/20/2017 18:07   Ir US Guide Vasc Access Right  Result Date: 10/20/2017 INDICATION: 52 year old with end-stage renal disease. Patient has a thrombosed left wrist fistula and needs hemodialysis. EXAM: FLUOROSCOPIC AND ULTRASOUND GUIDED PLACEMENT OF A NON-TUNNELED DIALYSIS CATHETER Physician: Stephan Minister. [place]enn, MD MEDICATIONS: None ANESTHESIA/SEDATION: None FLUOROSCOPY TIME:  Fluoroscopy Time: 18 seconds, 1 mGy COMPLICATIONS: None immediate. PROCEDURE: Informed consent was obtained for catheter placement. The patient was placed supine on the interventional table. Ultrasound confirmed a patent right internal jugular vein. Ultrasound images were obtained for documentation. The right side of the neck was prepped and draped in a sterile fashion. The right side of the neck neck was anesthetized with 1% lidocaine. Maximal barrier sterile technique was utilized including caps, mask, sterile gowns, sterile gloves, sterile drape, hand hygiene and skin antiseptic. A small incision was made with #11 blade scalpel. A 21 gauge needle directed into the right internal jugular vein with ultrasound guidance. A micropuncture dilator set was placed. A 20 cm Mahurkar catheter was selected. The catheter was advanced over a wire and positioned at the superior cavoatrial junction. Fluoroscopic images were obtained for documentation. Both dialysis lumens were found to aspirate and  flush well. The proper amount of heparin was flushed in both lumens. The central venous lumen was flushed with normal saline. Catheter was sutured to skin. FINDINGS: Catheter tip at the superior cavoatrial junction. IMPRESSION: Successful placement of a right jugular non-tunneled dialysis catheter using ultrasound and fluoroscopic guidance. Electronically Signed   By: Markus Daft M.D.   On: 10/20/2017 18:07    ____________________________________________   PROCEDURES  Procedure(s) performed:   Procedures  None ____________________________________________   INITIAL IMPRESSION / ASSESSMENT AND PLAN / ED COURSE  Pertinent labs & imaging results that were available during my care of the patient were reviewed by me and considered in my medical decision making (see chart for details).  Patient presents to the emergency department for evaluation of fistula malfunction.  He was discharged to interventional radiology where hemodialysis catheter was placed.  He is now returned with x-ray showing catheter in good placement.  I reviewed the patient's labs from earlier showing a potassium of 5.7.  Will repeat an EKG and speak with Nephrology.   Spoke with Nephrology, Dr. Posey Pronto. Patient will undergo HD tonight and be  discharged to home afterwards. No admit required.  ____________________________________________  FINAL CLINICAL IMPRESSION(S) / ED DIAGNOSES  Final diagnoses:  Hyperkalemia  Complication of arteriovenous dialysis fistula, initial encounter     MEDICATIONS GIVEN DURING THIS VISIT:  Medications  Chlorhexidine Gluconate Cloth 2 % PADS 6 each (has no administration in time range)   Note:  This document was prepared using Dragon voice recognition software and may include unintentional dictation errors.  Nanda Quinton, MD Emergency Medicine    Long, Wonda [facility], MD 10/21/17 [phone]

## 2017-10-20 NOTE — ED Triage Notes (Signed)
Pt in stating he is having a problem with his dilaysis graft, noticed it was worse on Wednesday after his treatment, today they were unable to access it. Unable to get an appointment with vascular so he was advised to come to ED for evaluation and possible temporary catheter placement

## 2017-10-20 NOTE — ED Notes (Addendum)
Pt will be going to IR for procedure, was screened by provider in ED, placed up for discharge

## 2017-10-20 NOTE — ED Provider Notes (Signed)
Patient placed in Quick Look pathway, seen and evaluated   Chief Complaint: Fistula problem  HPI:   52 year old male with ESRD (Dialysis M, W, F). He states he started to have soreness over the fistula yesterday. He went for dialysis today and was not able to complete it because it was clotted. He was told to come to the ED.   ROS: +clotted fistula  Physical Exam:   Gen: No distress  Neuro: Awake and Alert  Skin: Warm    Focused Exam: LUE: No palpable thrill. There is a palpable pulse   Initiation of care has begun. The patient has been counseled on the process, plan, and necessity for staying for the completion/evaluation, and the remainder of the medical screening examination     Recardo Evangelist, PA-C 10/20/17 [place], MD 10/20/17 [phone]

## 2017-10-20 NOTE — ED Triage Notes (Signed)
The pt went to dialysis on Wednesday and he had dialysis  Thursday the lt arm was sore  He went back to dialysis today and the fistula did not work  He went to ?? Xray and they placed a dialysis catheter in his rt chest  For dialysis later tonight

## 2017-10-20 NOTE — ED Provider Notes (Signed)
Patient placed in Quick Look pathway, seen and evaluated   Chief Complaint: Needs dialysis  HPI:   52 year old male presents for dialysis. He was seen earlier in the ED by me but has mistakenly come here instead of going to IR. He was discharged and went to IR and had a catheter placed. He is back now to have emergent dialysis  ROS: +needs dialysis  Physical Exam:   Gen: No distress  Neuro: Awake and Alert  Skin: Warm    Focused Exam: Catheter in place in RU chest wall   Initiation of care has begun. The patient has been counseled on the process, plan, and necessity for staying for the completion/evaluation, and the remainder of the medical screening examination    Recardo Evangelist, PA-C 10/20/17 [place], Kevin, MD 10/20/17 1659

## 2017-10-20 NOTE — Procedures (Signed)
Placement of right jugularTemp Cath.  Tip at SVC/RA junction and ready to use. Minimal blood loss and no immediate complication.

## 2017-10-21 ENCOUNTER — Other Ambulatory Visit: Payer: Self-pay | Admitting: Radiology

## 2017-10-21 NOTE — Progress Notes (Signed)
Pt signed off 51mins early of HD tx and discharged home per MD order in stable condition. Transported via W/C to ED waiting area

## 2017-10-23 ENCOUNTER — Encounter (HOSPITAL_COMMUNITY): Payer: Self-pay

## 2017-10-23 ENCOUNTER — Inpatient Hospital Stay (HOSPITAL_COMMUNITY)
Admission: EM | Admit: 2017-10-23 | Discharge: 2017-10-26 | DRG: 252 | Disposition: A | Discharge status: Home / Self Care | Payer: Medicare Other | Source: Other Acute Inpatient Hospital | Attending: Family Medicine | Admitting: Family Medicine

## 2017-10-23 ENCOUNTER — Ambulatory Visit (HOSPITAL_COMMUNITY)
Admission: RE | Admit: 2017-10-23 | Discharge: 2017-10-23 | Disposition: A | Discharge status: Home / Self Care | Payer: Medicare Other | Source: Ambulatory Visit | Attending: Nephrology | Admitting: Nephrology

## 2017-10-23 ENCOUNTER — Other Ambulatory Visit: Payer: Self-pay

## 2017-10-23 DIAGNOSIS — I5032 Chronic diastolic (congestive) heart failure: Secondary | ICD-10-CM | POA: Diagnosis present

## 2017-10-23 DIAGNOSIS — F17210 Nicotine dependence, cigarettes, uncomplicated: Secondary | ICD-10-CM | POA: Diagnosis present

## 2017-10-23 DIAGNOSIS — Z7982 Long term (current) use of aspirin: Secondary | ICD-10-CM

## 2017-10-23 DIAGNOSIS — N186 End stage renal disease: Secondary | ICD-10-CM

## 2017-10-23 DIAGNOSIS — Z992 Dependence on renal dialysis: Secondary | ICD-10-CM

## 2017-10-23 DIAGNOSIS — N2581 Secondary hyperparathyroidism of renal origin: Secondary | ICD-10-CM | POA: Diagnosis present

## 2017-10-23 DIAGNOSIS — I120 Hypertensive chronic kidney disease with stage 5 chronic kidney disease or end stage renal disease: Secondary | ICD-10-CM | POA: Diagnosis not present

## 2017-10-23 DIAGNOSIS — E875 Hyperkalemia: Secondary | ICD-10-CM | POA: Diagnosis not present

## 2017-10-23 DIAGNOSIS — I132 Hypertensive heart and chronic kidney disease with heart failure and with stage 5 chronic kidney disease, or end stage renal disease: Secondary | ICD-10-CM | POA: Diagnosis present

## 2017-10-23 DIAGNOSIS — Z8249 Family history of ischemic heart disease and other diseases of the circulatory system: Secondary | ICD-10-CM

## 2017-10-23 DIAGNOSIS — T82590A Other mechanical complication of surgically created arteriovenous fistula, initial encounter: Secondary | ICD-10-CM

## 2017-10-23 DIAGNOSIS — D631 Anemia in chronic kidney disease: Secondary | ICD-10-CM | POA: Diagnosis present

## 2017-10-23 DIAGNOSIS — F1410 Cocaine abuse, uncomplicated: Secondary | ICD-10-CM | POA: Diagnosis present

## 2017-10-23 DIAGNOSIS — T82868A Thrombosis of vascular prosthetic devices, implants and grafts, initial encounter: Principal | ICD-10-CM

## 2017-10-23 DIAGNOSIS — Y832 Surgical operation with anastomosis, bypass or graft as the cause of abnormal reaction of the patient, or of later complication, without mention of misadventure at the time of the procedure: Secondary | ICD-10-CM | POA: Diagnosis present

## 2017-10-23 DIAGNOSIS — J449 Chronic obstructive pulmonary disease, unspecified: Secondary | ICD-10-CM | POA: Diagnosis present

## 2017-10-23 LAB — RENAL FUNCTION PANEL
Albumin: 4 g/dL (ref 3.5–5.0)
Anion gap: 12 (ref 5–15)
BUN: 43 mg/dL — ABNORMAL HIGH (ref 6–20)
CO2: 28 mmol/L (ref 22–32)
Calcium: 8.9 mg/dL (ref 8.9–10.3)
Chloride: 98 mmol/L — ABNORMAL LOW (ref 101–111)
Creatinine, Ser: 9.55 mg/dL — ABNORMAL HIGH (ref 0.61–1.24)
GFR calc Af Amer: 6 mL/min — ABNORMAL LOW (ref >60)
GFR calc non Af Amer: 6 mL/min — ABNORMAL LOW (ref >60)
Glucose, Bld: 135 mg/dL — ABNORMAL HIGH (ref 65–99)
Phosphorus: 3 mg/dL (ref 2.5–4.6)
Potassium: 4.5 mmol/L (ref 3.5–5.1)
Sodium: 138 mmol/L (ref 135–145)

## 2017-10-23 LAB — CBC
HCT: 35.6 % — ABNORMAL LOW (ref 39.0–52.0)
HEMATOCRIT: 33.9 % — AB (ref 39.0–52.0)
HEMOGLOBIN: 10.7 g/dL — AB (ref 13.0–17.0)
Hemoglobin: 11.2 g/dL — ABNORMAL LOW (ref 13.0–17.0)
MCH: 32.1 pg (ref 26.0–34.0)
MCH: 32 pg (ref 26.0–34.0)
MCHC: 31.6 g/dL (ref 30.0–36.0)
MCHC: 31.5 g/dL (ref 30.0–36.0)
MCV: 101.8 fL — AB (ref 78.0–100.0)
MCV: 101.7 fL — ABNORMAL HIGH (ref 78.0–100.0)
Platelets: 147 10*3/uL — ABNORMAL LOW (ref 150–400)
Platelets: 158 10*3/uL (ref 150–400)
RBC: 3.33 MIL/uL — ABNORMAL LOW (ref 4.22–5.81)
RBC: 3.5 MIL/uL — ABNORMAL LOW (ref 4.22–5.81)
RDW: 14.3 % (ref 11.5–15.5)
RDW: 14.4 % (ref 11.5–15.5)
WBC: 4.8 10*3/uL (ref 4.0–10.5)
WBC: 4.5 10*3/uL (ref 4.0–10.5)

## 2017-10-23 LAB — BASIC METABOLIC PANEL
ANION GAP: 16 — AB (ref 5–15)
BUN: 70 mg/dL — ABNORMAL HIGH (ref 6–20)
CO2: 24 mmol/L (ref 22–32)
Calcium: 9.4 mg/dL (ref 8.9–10.3)
Chloride: 96 mmol/L — ABNORMAL LOW (ref 101–111)
Creatinine, Ser: 14.99 mg/dL — ABNORMAL HIGH (ref 0.61–1.24)
GFR calc Af Amer: 4 mL/min — ABNORMAL LOW (ref >60)
GFR calc non Af Amer: 3 mL/min — ABNORMAL LOW (ref >60)
Glucose, Bld: 82 mg/dL (ref 65–99)
POTASSIUM: 7.1 mmol/L — AB (ref 3.5–5.1)
Sodium: 136 mmol/L (ref 135–145)

## 2017-10-23 LAB — PROTIME-INR
INR: 1.07
Prothrombin Time: 13.8 seconds (ref 11.4–15.2)

## 2017-10-23 MED ORDER — ALTEPLASE 2 MG IJ SOLR
2.0000 mg | Freq: Once | INTRAMUSCULAR | Status: DC | PRN
  Filled 2017-10-23 (×2): qty 2

## 2017-10-23 MED ORDER — PENTAFLUOROPROP-TETRAFLUOROETH EX AERO: 1.0000 "application " | INHALATION_SPRAY | CUTANEOUS | Status: DC | PRN

## 2017-10-23 MED ORDER — CHLORHEXIDINE GLUCONATE CLOTH 2 % EX PADS: 6.0000 | MEDICATED_PAD | Freq: Every day | CUTANEOUS | Status: DC

## 2017-10-23 MED ORDER — HYDROXYZINE HCL 25 MG PO TABS
25.0000 mg | ORAL_TABLET | Freq: Three times a day (TID) | ORAL | Status: DC | PRN
Start: 2017-10-23 — End: 2017-10-26
  Administered 2017-10-24 – 2017-10-25 (×2): 25 mg via ORAL
  Filled 2017-10-23 (×3): qty 1

## 2017-10-23 MED ORDER — IOPAMIDOL (ISOVUE-300) INJECTION 61%
INTRAVENOUS | Status: AC
  Filled 2017-10-23: qty 100

## 2017-10-23 MED ORDER — ACETAMINOPHEN 650 MG RE SUPP: 650.0000 mg | Freq: Four times a day (QID) | RECTAL | Status: DC | PRN

## 2017-10-23 MED ORDER — DOXERCALCIFEROL 4 MCG/2ML IV SOLN
INTRAVENOUS | Status: AC
  Filled 2017-10-23: qty 2

## 2017-10-23 MED ORDER — SODIUM CHLORIDE 0.9 % IV SOLN
1.0000 g | INTRAVENOUS | Status: AC
  Filled 2017-10-23: qty 10

## 2017-10-23 MED ORDER — MIDAZOLAM HCL 2 MG/2ML IJ SOLN
INTRAMUSCULAR | Status: AC
  Filled 2017-10-23: qty 4

## 2017-10-23 MED ORDER — CLONIDINE HCL 0.3 MG PO TABS
0.3000 mg | ORAL_TABLET | Freq: Three times a day (TID) | ORAL | Status: DC
  Administered 2017-10-24 – 2017-10-26 (×5): 0.3 mg via ORAL
  Filled 2017-10-23 (×8): qty 1

## 2017-10-23 MED ORDER — ACETAMINOPHEN 325 MG PO TABS: 650.0000 mg | ORAL_TABLET | Freq: Four times a day (QID) | ORAL | Status: DC | PRN

## 2017-10-23 MED ORDER — LIDOCAINE HCL (PF) 1 % IJ SOLN: 5.0000 mL | INTRAMUSCULAR | Status: DC | PRN

## 2017-10-23 MED ORDER — INSULIN ASPART 100 UNIT/ML ~~LOC~~ SOLN: 10.0000 [IU] | Freq: Once | SUBCUTANEOUS | Status: DC

## 2017-10-23 MED ORDER — ALBUTEROL SULFATE (2.5 MG/3ML) 0.083% IN NEBU: 10.0000 mg | INHALATION_SOLUTION | Freq: Once | RESPIRATORY_TRACT | Status: DC

## 2017-10-23 MED ORDER — FENTANYL CITRATE (PF) 100 MCG/2ML IJ SOLN
INTRAMUSCULAR | Status: AC
  Filled 2017-10-23: qty 4

## 2017-10-23 MED ORDER — LIDOCAINE-PRILOCAINE 2.5-2.5 % EX CREA: 1.0000 "application " | TOPICAL_CREAM | CUTANEOUS | Status: DC | PRN

## 2017-10-23 MED ORDER — AMLODIPINE BESYLATE 10 MG PO TABS
10.0000 mg | ORAL_TABLET | Freq: Every day | ORAL | Status: DC
  Administered 2017-10-24: 10 mg via ORAL
  Filled 2017-10-23 (×3): qty 1

## 2017-10-23 MED ORDER — LIDOCAINE HCL 1 % IJ SOLN
INTRAMUSCULAR | Status: AC
  Filled 2017-10-23: qty 20

## 2017-10-23 MED ORDER — LOSARTAN POTASSIUM 50 MG PO TABS
100.0000 mg | ORAL_TABLET | Freq: Every day | ORAL | Status: DC
  Filled 2017-10-23 (×3): qty 2

## 2017-10-23 MED ORDER — SODIUM CHLORIDE 0.9 % IV SOLN: INTRAVENOUS | Status: DC

## 2017-10-23 MED ORDER — HEPARIN SODIUM (PORCINE) 1000 UNIT/ML DIALYSIS: 1000.0000 [IU] | INTRAMUSCULAR | Status: DC | PRN

## 2017-10-23 MED ORDER — DOXERCALCIFEROL 4 MCG/2ML IV SOLN
5.0000 ug | INTRAVENOUS | Status: DC
  Administered 2017-10-23 – 2017-10-25 (×2): 5 ug via INTRAVENOUS
  Filled 2017-10-23 (×2): qty 4

## 2017-10-23 MED ORDER — INSULIN ASPART 100 UNIT/ML IV SOLN: 10.0000 [IU] | Freq: Once | INTRAVENOUS | Status: DC

## 2017-10-23 MED ORDER — HEPARIN SODIUM (PORCINE) 5000 UNIT/ML IJ SOLN
5000.0000 [IU] | Freq: Three times a day (TID) | INTRAMUSCULAR | Status: DC
  Filled 2017-10-23 (×2): qty 1

## 2017-10-23 MED ORDER — SODIUM CHLORIDE 0.9 % IV SOLN: 100.0000 mL | INTRAVENOUS | Status: DC | PRN

## 2017-10-23 MED ORDER — DEXTROSE 50 % IV SOLN
1.0000 | Freq: Once | INTRAVENOUS | Status: DC
  Filled 2017-10-23: qty 50

## 2017-10-23 MED ORDER — ASPIRIN 81 MG PO CHEW
81.0000 mg | CHEWABLE_TABLET | Freq: Every day | ORAL | Status: DC
  Administered 2017-10-24 – 2017-10-26 (×3): 81 mg via ORAL
  Filled 2017-10-23 (×3): qty 1

## 2017-10-23 MED ORDER — PANTOPRAZOLE SODIUM 20 MG PO TBEC
20.0000 mg | DELAYED_RELEASE_TABLET | Freq: Every day | ORAL | Status: DC
  Administered 2017-10-24 – 2017-10-26 (×3): 20 mg via ORAL
  Filled 2017-10-23 (×3): qty 1

## 2017-10-23 MED ORDER — ALTEPLASE 2 MG IJ SOLR: 2.0000 mg | Freq: Once | INTRAMUSCULAR | Status: DC | PRN

## 2017-10-23 NOTE — H&P (Addendum)
[facility] Admission History and Physical Service Pager: [phone]  Patient name: Jeremy Mccoy Medical record number: [id number] Date of birth: Jan 19, 1966 Age: 52 y.o. Gender: male  Primary Care Provider: Patient, No Pcp Per Consultants: Nephrology, Interventional Radiology Code Status: full  Chief Complaint: hyperkalemia  Assessment and Plan: Jeremy Mccoy is a 52 y.o. male presenting with electrolyte abnormalities 2/2 a clogged LUE AV fistula.  Hyperkalemia K+ 7.1. Currently receiving dialysis. Management per nephrology. Likely due to a missed dialysis session. Last dialysis session on 6/7, patient reports he never does a full session and always sign himself early and did so on Friday which might explained worsening K+ over the weekend. Some peaked T-waves seen on EKG but otherwise no symtoms. Will need to repeat am renal function panel. Could consider usual treatment options of lasix, insulin, b-agonist, kayexalate but emergent dialysis is best treatment for K this high. - admit to inpatient family medicine, Dr. 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 Has been on dialysis since 2014. Has been getting dialysis through AV fistula since that time. Patient currently on MWF dialysis schedule. Getting dialysis session in afternoon of 6/10. Dialysis through HD cath in R IJ. Will need to go with IR in am of 6/11 for angioplasty vs thrombolysis vs thrombectomy to make L radiocephalic fistula usable again.  - nephrology followin,g appreciate their recs - dialysis MWF - With IR in am for procedure to unclog LUE AV fistula - NPO at midnight - cbc, RFP, pt/inr, aptt in  am  Anemia Likely 2/2 ESRD. hgb 10.7 on admission. Well above threshold for transfusion. EPO with dialysis. - daily cbc - likely needs epo with dialysis  Hypertension Well controlled on current regimen.  Elevated during dialysis up to 160/109. Takes norvasc, coreg, clonidine, losartan. Will hold patient's coreg while inpatient due to cocaine abuse. Will hold this medication in discharge due to contraindication in cocaine abuse. - continue norvasc 10mg  - continue clonidine 0.3mg  tid - continue losartan 100mg  daily - holding coreg - hold coreg on discharge  HFpEF Echo from 2015 showing EF of 50-55%. Could likely benefit from another echo but do not think this is useful as inpatient. Already on most medications indicated for HFrEF if he is shown to have that at some pain. Could add spiro if EF drops below 45%. - continue norvasc, clonidine, losartan - holding coreg - consider repeat echo as outpatient  Cocaine Abuse Will hold home coreg in setting of cocaine abuse. 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 Will need counseling as outpatient. Has been using 28 years.  PMH is significant for ESRD, anemia, HFpEF, cocaine abuse, COPD, HTN, tobacco abuse.  FEN/GI: renal diet, NPO after midnight Prophylaxis: heparin 5000U, pantoprazole  Disposition: likely home tomorrow 6/11 after IR procedure  History of Present Illness:  Jeremy Mccoy is a 52 y.o. male presenting due to increased potassium. Patient is a MWF dialyzer. It was noted that his LUE radiocephalic aV fistula became clotted towards the end of his session on 6/7. He was to have a thrombectomy vs angioplasty done with IR on the morning of 6/10. Pre-operative labs were checked and he was found to have a potassium of 7.1.  He was sent to [facility] for emergent dialysis. Patient evaluated by nephrology in the ED and he was sent up for dialysis. Currently receiving dialysis at the time of this admission H&P.  Patient has had no  symptoms of palpitations, increased shortness of breath, chest pain or any other findings. He feels to be in his usual state of health aside from a sore throat. Additional workup consisted of a cbc which showed hgb of 10.7, plt 147. His creatinine was 15. Pt/INR was negative. ekg showed sinus rhythm, peaked T-waves.   Review Of Systems: Per HPI with the following additions:  Review of Systems  Constitutional: Negative for chills and fever.  HENT: Positive for sore throat.   Respiratory: Negative for cough.   Cardiovascular: Negative for chest pain and palpitations.  Gastrointestinal: Negative for abdominal pain, constipation, diarrhea, nausea and vomiting.  Neurological: Negative for dizziness and headaches.    Patient Active Problem List   Diagnosis Date Noted  . ESRD (end stage renal disease) on dialysis ([place]) 10/20/2017  . Plantar fasciitis, bilateral 09/12/2017  . Hyperkalemia 02/23/2016  . Coughing blood   . Vasculitis ([place])   . Lung crackles   . Hemoptysis 07/25/2015  . Anemia 02/20/2014  . Symptomatic anemia 02/20/2014  . PAF (paroxysmal atrial fibrillation) ([place]) 01/31/2014  . Chronic diastolic congestive heart failure ([place]) 01/31/2014  . COPD (chronic obstructive pulmonary disease) ([place]) 01/31/2014  . Thrombocytopenia ([place]) 01/23/2014  . Unspecified constipation 01/23/2014  . High anion gap metabolic acidosis 67/34/1937  . ESRD on hemodialysis ([place]) 11/05/2013  . Seizure disorder ([place]) 11/04/2013  . Protein-calorie malnutrition, severe ([place]) 07/12/2013  . Respiratory failure ([place]) 07/10/2013  . Status epilepticus ([place]) 07/10/2013  . Essential hypertension 07/10/2013  . Acute blood loss anemia 05/15/2013  . Hematemesis 05/15/2013  . Volume excess 09/20/2012  . Abnormal EKG 09/20/2012  . Drug abuse ([place]) 09/20/2012  . Elevated troponin 09/20/2012  . Anemia in chronic kidney disease 09/20/2012  . Pulmonary edema 06/12/2012  . Cocaine abuse ([place]) 06/12/2012  . End  stage renal disease ([place]) 01/11/2012  . Tobacco abuse 11/04/2011  . Dental caries 11/04/2011  . Color blindness 11/04/2011    Past Medical History: Past Medical History:  Diagnosis Date  . Anemia   . Anxiety   . Arthritis    "qwhere" (05/15/2013)  . CHF (congestive heart failure) ([place])   . COPD (chronic obstructive pulmonary disease) ([place])   . Crack cocaine use   . Dental caries   . Depression   . End stage renal disease ([place]) 01/11/2012   Patient presented 11/04/11 to hospital with CP, wt loss and N/V. Creat was 10, admitted. 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 ANA was + 1:80, +MPO and +pr3 Ab's (ANCA). Received plasmapheresis x 7, IV cytoxan and pred taper. First HD was 11/17/11. 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 Pt d/c'd to do outpt HD and may have gotten 1-2 additional Cytoxan as OP, but this was stopped eventually since renal function didn't recover. Now gets HD TTS schedule at [facility].  L forearm AVF 11/19/11 by Dr. Scot Dock is current access.    Marland Kitchen ESRD (end stage renal disease) on dialysis [facility])    TTS: [address] [place] (05/15/2013)  . ESRD (end stage renal disease) on dialysis ([place])   . Headache(784.0)    "get it from going to dialysis; when they  get real bad I come to hospital" (05/15/2013)  . Headache(784.0)   . Hematemesis/vomiting blood   . History of blood transfusion 10/2011; 04/2013  . HTN (hypertension) 06/12/2012  . Renal insufficiency   . Seizures ([place])   . Shortness of breath    "just when I have too much potassium is too high" (05/15/2013)    Past Surgical History: Past Surgical History:  Procedure Laterality Date  . AV FISTULA PLACEMENT  11/29/2011   Procedure: ARTERIOVENOUS (AV) FISTULA CREATION;  Surgeon: Angelia Mould, MD;  Location: [facility] OR;  Service: Vascular;  Laterality: Left;  .  ESOPHAGOGASTRODUODENOSCOPY N/A 05/17/2013   Procedure: ESOPHAGOGASTRODUODENOSCOPY (EGD);  Surgeon: Beryle Beams, MD;  Location[facility] ENDOSCOPY;  Service: Endoscopy;  Laterality: N/A;  . INGUINAL HERNIA REPAIR Bilateral 1967  . IR FLUORO GUIDE CV LINE RIGHT  10/20/2017  . IR US GUIDE VASC ACCESS RIGHT  10/20/2017  . RENAL BIOPSY  11/07/2011    Social History: Social History   Tobacco Use  . Smoking status: Current Every Day Smoker    Packs/day: 1.00    Years: 35.00    Pack years: 35.00    Types: Cigars, Cigarettes  . Smokeless tobacco: Never Used  Substance Use Topics  . Alcohol use: No    Comment: 05/15/2013 "aien't drank in ~ 25 yrs"  . Drug use: Yes    Types: Marijuana, Cocaine    Comment: Precription drugs (e.g. percocet) "I take what I have to to controll my constant pain" (05/15/2013)   Additional social history:  Please also refer to relevant sections of EMR.  Family History: Family History  Problem Relation Age of Onset  . Heart attack Father     Allergies and Medications: No Known Allergies Current Facility-Administered Medications on File Prior to Encounter  Medication Dose Route Frequency Provider Last Rate Last Dose  . 0.9 %  sodium chloride infusion   Intravenous Continuous Monia Sabal, PA-C       Current Outpatient Medications on File Prior to Encounter  Medication Sig Dispense Refill  . acetaminophen (TYLENOL) 500 MG tablet Take 1 tablet (500 mg total) by mouth every 8 (eight) hours as needed for mild pain or fever. 6 times a day, 500 mg, 12 to 24 tabs a day, 6,000 to 12,000 mg daily for pain 30 tablet 0  . amLODipine (NORVASC) 10 MG tablet Take 1 tablet (10 mg total) by mouth daily. 30 tablet 1  . aspirin 81 MG chewable tablet Chew 1 tablet (81 mg total) by mouth daily. 30 tablet 11  . carvedilol (COREG) 25 MG tablet Take 1.5 tablets (37.5 mg total) by mouth 2 (two) times daily with a meal. On Monday-Wednesday-Friday-Sunday (non-HD days); on dialysis days  (Tuesday-Thursday-Saturday) take it once a day (evening time only). (Patient taking differently: Take 25 mg by mouth 2 (two) times daily with a meal. ) 90 tablet 2  . cinacalcet (SENSIPAR) 60 MG tablet Take 60 mg by mouth daily.     . cloNIDine (CATAPRES) 0.3 MG tablet Take 0.3 mg by mouth 3 (three) times daily.     . hydrOXYzine (ATARAX/VISTARIL) 25 MG tablet Take 25 mg by mouth 3 (three) times daily as needed for itching.   6  . losartan (COZAAR) 100 MG tablet Take 100 mg by mouth at bedtime.    . multivitamin (RENA-VIT) TABS tablet Take 1 tablet by mouth at bedtime.    . Nutritional Supplements (FEEDING SUPPLEMENT, NEPRO CARB STEADY,) LIQD Take 237 mLs by mouth  3 (three) times daily as needed (Supplement).  0  . pantoprazole (PROTONIX) 20 MG tablet Take 20 mg by mouth daily.    . polyethylene glycol (MIRALAX / GLYCOLAX) packet Take 17 g by mouth daily as needed for mild constipation. 14 each 0  . ranitidine (ZANTAC) 300 MG tablet Take 300 mg by mouth daily as needed for heartburn.    . sevelamer carbonate (RENVELA) 800 MG tablet Take 2 tablets (1,600 mg total) by mouth 3 (three) times daily with meals. (Patient taking differently: Take 2,400 mg by mouth 3 (three) times daily with meals. And with snacks) 90 tablet 0    Objective: BP (!) 146/103   Pulse 78   Temp 98.5 F (36.9 C) (Oral)   Resp (!) 21   Ht 6' (1.829 m)   Wt 163 lb 9.3 oz (74.2 kg)   SpO2 97%   BMI 22.19 kg/m  Exam: General: thin, caucasian male, resting comfortably in bed. Arby's bag noted in bed Eyes: eomi, perrla.  ENTM: no signs of trauma to face or ears Neck: range of motion intact Cardiovascular: rrr, no m/r/g. LUE radiocephalic fistula without thrill. There is arterial flow noted in most distal area. Tender to palpation. Right IJ HD catheter in place, currently dialyzing. Respiratory: lungs clear to ausculation bilaterally, no increased work of breathing Gastrointestinal: soft, non-tender, non-distended MSK: 5/5  strength BUE, BLE.  Derm: warm, dry Neuro: cn 2-12 intact, no focal neuro deficits Psych: appropriate  Labs and Imaging: CBC BMET  Recent Labs  Lab 10/23/17 1027  WBC 4.8  HGB 10.7*  HCT 33.9*  PLT 147*   Recent Labs  Lab 10/23/17 1027  NA 136  K 7.1*  CL 96*  CO2 24  BUN 70*  CREATININE 14.99*  GLUCOSE 82  CALCIUM 9.4      I have seen and evaluated the patient with Dr. Kris Mouton. I am in agreement with the note above in its revised form. My additions are in blue.  Marjie Skiff, MD Family Medicine, PGY-2  Guadalupe Dawn, MD 10/23/2017, 4:04 PM PGY-1, [place] [facility] pager: [phone], text pages welcome

## 2017-10-23 NOTE — ED Triage Notes (Signed)
Pt coming from IR, was sent there to get his fistula declotted but they were unable to due to his potassium resulting as 7.1. Pt sent here to be admitted for dialysis. Pt has temporary cath in right side of his neck. Pt a.o, nad

## 2017-10-23 NOTE — Progress Notes (Signed)
Pt refusing assessments and medications. Will continue to monitor.

## 2017-10-23 NOTE — Progress Notes (Addendum)
Pt with ESRD and clotted AVF.  He was dialyzed here last week after having had a temporary catheter place and was d/c early 6/8 am after signing off 30 min early.  He presented today for declot but K was found to be 7.1.  His temporary catheter had not been removed at discharge so he is being emergently dialyzed. Goal set for 5.7 based on HD - stressed the need to stay on full treatment.  He is being dialyzed for 2 hr on 1K and 2 hr on 2 K.  It is my understanding he will stay overnight under OBS with declot in the am by IR.  He WILL need to have the temporary catheter removed prior to discharge.  Amalia Hailey, PA-C [facility].  Kelly Splinter MD [employer] pgr [phone]   10/23/2017, 5:03 PM

## 2017-10-23 NOTE — H&P (Signed)
Chief Complaint: Thrombosed left arteriovenous fistula  Referring Physician(s): Goldsborough,Kellie  Supervising Physician: Aletta Edouard  Patient Status: [facility] - Out-pt  History of Present Illness: Jeremy Mccoy is a 52 y.o. male who is here today for declot of his left arteriovenous fistula.  He presented on Friday in IR and had placement of a temporary dialysis catheter.  Pre-op labs showed critically elevated potassium at 7.1.  He is NPO.  Past Medical History:  Diagnosis Date  . Anemia   . Anxiety   . Arthritis    "qwhere" (05/15/2013)  . CHF (congestive heart failure) ([place])   . COPD (chronic obstructive pulmonary disease) ([place])   . Crack cocaine use   . Dental caries   . Depression   . End stage renal disease ([place]) 01/11/2012   Patient presented 11/04/11 to hospital with CP, wt loss and N/V. Creat was 10, admitted. 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 ANA was + 1:80, +MPO and +pr3 Ab's (ANCA). Received plasmapheresis x 7, IV cytoxan and pred taper. First HD was 11/17/11. 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 Pt d/c'd to do outpt HD and may have gotten 1-2 additional Cytoxan as OP, but this was stopped eventually since renal function didn't recover. Now gets HD TTS schedule at [facility].  L forearm AVF 11/19/11 by Dr. Scot Dock is current access.    Marland Kitchen ESRD (end stage renal disease) on dialysis [facility])    TTS: [address] [place] (05/15/2013)  . ESRD (end stage renal disease) on dialysis ([place])   . Headache(784.0)    "get it from going to dialysis; when they get real bad I come to hospital" (05/15/2013)  . Headache(784.0)   . Hematemesis/vomiting blood   . History of blood transfusion 10/2011; 04/2013  . HTN (hypertension) 06/12/2012  . Renal insufficiency   . Seizures ([place])   . Shortness of breath    "just when I have too much  potassium is too high" (05/15/2013)    Past Surgical History:  Procedure Laterality Date  . AV FISTULA PLACEMENT  11/29/2011   Procedure: ARTERIOVENOUS (AV) FISTULA CREATION;  Surgeon: Angelia Mould, MD;  Location: [facility] OR;  Service: Vascular;  Laterality: Left;  . ESOPHAGOGASTRODUODENOSCOPY N/A 05/17/2013   Procedure: ESOPHAGOGASTRODUODENOSCOPY (EGD);  Surgeon: Beryle Beams, MD;  Location: [facility] ENDOSCOPY;  Service: Endoscopy;  Laterality: N/A;  . INGUINAL HERNIA REPAIR Bilateral 1967  . IR FLUORO GUIDE CV LINE RIGHT  10/20/2017  . IR US GUIDE VASC ACCESS RIGHT  10/20/2017  . RENAL BIOPSY  11/07/2011    Allergies: Patient has no known allergies.  Medications: Prior to Admission medications   Medication Sig Start Date End Date Taking? Authorizing Provider  amLODipine (NORVASC) 10 MG tablet Take 1 tablet (10 mg total) by mouth daily. 01/25/14  Yes McLean-Scocuzza, Nino Glow, MD  aspirin 81 MG chewable tablet Chew 1 tablet (81 mg total) by mouth daily. 01/25/14  Yes McLean-Scocuzza, Nino Glow, MD  carvedilol (COREG) 25 MG tablet Take 1.5 tablets (37.5 mg total) by mouth 2 (two) times daily with a meal. On Monday-Wednesday-Friday-Sunday (non-HD days); on dialysis days (Tuesday-Thursday-Saturday) take it once a day (evening time only). Patient taking differently: Take 25 mg by mouth 2 (two) times daily with a meal.  01/30/14  Yes Croitoru, Mihai, MD  cinacalcet (SENSIPAR) 60 MG tablet Take 60 mg by mouth daily.  Yes [provider]  cloNIDine (CATAPRES) 0.3 MG tablet Take 0.3 mg by mouth 3 (three) times daily.    Yes [provider]  hydrOXYzine (ATARAX/VISTARIL) 25 MG tablet Take 25 mg by mouth 3 (three) times daily as needed for itching.  06/10/15  Yes [provider]  losartan (COZAAR) 100 MG tablet Take 100 mg by mouth at bedtime. 08/02/16  Yes [provider]  multivitamin (RENA-VIT) TABS tablet Take 1 tablet by mouth at bedtime. 11/29/11  Yes Alric Seton,  PA-C  Nutritional Supplements (FEEDING SUPPLEMENT, NEPRO CARB STEADY,) LIQD Take 237 mLs by mouth 3 (three) times daily as needed (Supplement). 05/17/13  Yes Mikhail, Maryann, DO  pantoprazole (PROTONIX) 20 MG tablet Take 20 mg by mouth daily. 02/21/16  Yes [provider]  sevelamer carbonate (RENVELA) 800 MG tablet Take 2 tablets (1,600 mg total) by mouth 3 (three) times daily with meals. Patient taking differently: Take 2,400 mg by mouth 3 (three) times daily with meals. And with snacks 01/25/14  Yes McLean-Scocuzza, Nino Glow, MD  acetaminophen (TYLENOL) 500 MG tablet Take 1 tablet (500 mg total) by mouth every 8 (eight) hours as needed for mild pain or fever. 6 times a day, 500 mg, 12 to 24 tabs a day, 6,000 to 12,000 mg daily for pain 08/05/16   Molt, Bethany, DO  polyethylene glycol (MIRALAX / GLYCOLAX) packet Take 17 g by mouth daily as needed for mild constipation. 08/05/16   Molt, Bethany, DO  ranitidine (ZANTAC) 300 MG tablet Take 300 mg by mouth daily as needed for heartburn.    [provider]     Family History  Problem Relation Age of Onset  . Heart attack Father     Social History   Socioeconomic History  . Marital status: Single    Spouse name: Not on file  . Number of children: 0  . Years of education: 9 th  . Highest education level: Not on file  Occupational History  . Occupation: Corporate treasurer    Comment: Disabled  Social Needs  . Financial resource strain: Not on file  . Food insecurity:    Worry: Not on file    Inability: Not on file  . Transportation needs:    Medical: Not on file    Non-medical: Not on file  Tobacco Use  . Smoking status: Current Every Day Smoker    Packs/day: 1.00    Years: 35.00    Pack years: 35.00    Types: Cigars, Cigarettes  . Smokeless tobacco: Never Used  Substance and Sexual Activity  . Alcohol use: No    Comment: 05/15/2013 "aien't drank in ~ 25 yrs"  . Drug use: Yes    Types: Marijuana, Cocaine    Comment:  Precription drugs (e.g. percocet) "I take what I have to to controll my constant pain" (05/15/2013)  . Sexual activity: Never  Lifestyle  . Physical activity:    Days per week: Not on file    Minutes per session: Not on file  . Stress: Not on file  Relationships  . Social connections:    Talks on phone: Not on file    Gets together: Not on file    Attends religious service: Not on file    Active member of club or organization: Not on file    Attends meetings of clubs or organizations: Not on file    Relationship status: Not on file  Other Topics Concern  . Not on file  Social History  Narrative   ** Merged History Encounter **   Patient lives at home with his parents and he is disabled.   Education 9th grade education   Right handed   Caffeine one cup daily         Review of Systems: A 12 point ROS discussed and pertinent positives are indicated in the HPI above.  All other systems are negative. Review of Systems  Vital Signs: BP (!) 143/110 (BP Location: Right Arm)   Pulse 63   Temp 97.6 F (36.4 C) (Oral)   Ht 6' (1.829 m)   Wt 160 lb (72.6 kg)   SpO2 96%   BMI 21.70 kg/m   Physical Exam  Constitutional: He is oriented to person, place, and time. He appears well-developed.  HENT:  Head: Normocephalic and atraumatic.  Eyes: EOM are normal.  Neck: Normal range of motion.  Cardiovascular: Normal rate, regular rhythm and normal heart sounds.  Pulmonary/Chest: Effort normal and breath sounds normal. No respiratory distress.  Abdominal: Soft.  Musculoskeletal: Normal range of motion.  Neurological: He is alert and oriented to person, place, and time.  Skin: Skin is warm and dry.  Psychiatric: He has a normal mood and affect. His behavior is normal. Judgment and thought content normal.  Vitals reviewed.   Imaging: Ir Fluoro Guide Cv Line Right  Result Date: 10/20/2017 INDICATION: 52 year old with end-stage renal disease. Patient has a thrombosed left wrist fistula  and needs hemodialysis. EXAM: FLUOROSCOPIC AND ULTRASOUND GUIDED PLACEMENT OF A NON-TUNNELED DIALYSIS CATHETER Physician: Stephan Minister. Henn, MD MEDICATIONS: None ANESTHESIA/SEDATION: None FLUOROSCOPY TIME:  Fluoroscopy Time: 18 seconds, 1 mGy COMPLICATIONS: None immediate. PROCEDURE: Informed consent was obtained for catheter placement. The patient was placed supine on the interventional table. Ultrasound confirmed a patent right internal jugular vein. Ultrasound images were obtained for documentation. The right side of the neck was prepped and draped in a sterile fashion. The right side of the neck neck was anesthetized with 1% lidocaine. Maximal barrier sterile technique was utilized including caps, mask, sterile gowns, sterile gloves, sterile drape, hand hygiene and skin antiseptic. A small incision was made with #11 blade scalpel. A 21 gauge needle directed into the right internal jugular vein with ultrasound guidance. A micropuncture dilator set was placed. A 20 cm Mahurkar catheter was selected. The catheter was advanced over a wire and positioned at the superior cavoatrial junction. Fluoroscopic images were obtained for documentation. Both dialysis lumens were found to aspirate and flush well. The proper amount of heparin was flushed in both lumens. The central venous lumen was flushed with normal saline. Catheter was sutured to skin. FINDINGS: Catheter tip at the superior cavoatrial junction. IMPRESSION: Successful placement of a right jugular non-tunneled dialysis catheter using ultrasound and fluoroscopic guidance. Electronically Signed   By: Markus Daft M.D.   On: 10/20/2017 18:07   Ir US Guide Vasc Access Right  Result Date: 10/20/2017 INDICATION: 52 year old with end-stage renal disease. Patient has a thrombosed left wrist fistula and needs hemodialysis. EXAM: FLUOROSCOPIC AND ULTRASOUND GUIDED PLACEMENT OF A NON-TUNNELED DIALYSIS CATHETER Physician: Stephan Minister. Henn, MD MEDICATIONS: None ANESTHESIA/SEDATION:  None FLUOROSCOPY TIME:  Fluoroscopy Time: 18 seconds, 1 mGy COMPLICATIONS: None immediate. PROCEDURE: Informed consent was obtained for catheter placement. The patient was placed supine on the interventional table. Ultrasound confirmed a patent right internal jugular vein. Ultrasound images were obtained for documentation. The right side of the neck was prepped and draped in a sterile fashion. The right side of the neck neck was  anesthetized with 1% lidocaine. Maximal barrier sterile technique was utilized including caps, mask, sterile gowns, sterile gloves, sterile drape, hand hygiene and skin antiseptic. A small incision was made with #11 blade scalpel. A 21 gauge needle directed into the right internal jugular vein with ultrasound guidance. A micropuncture dilator set was placed. A 20 cm Mahurkar catheter was selected. The catheter was advanced over a wire and positioned at the superior cavoatrial junction. Fluoroscopic images were obtained for documentation. Both dialysis lumens were found to aspirate and flush well. The proper amount of heparin was flushed in both lumens. The central venous lumen was flushed with normal saline. Catheter was sutured to skin. FINDINGS: Catheter tip at the superior cavoatrial junction. IMPRESSION: Successful placement of a right jugular non-tunneled dialysis catheter using ultrasound and fluoroscopic guidance. Electronically Signed   By: Markus Daft M.D.   On: 10/20/2017 18:07    Labs:  CBC: Recent Labs    10/20/17 1410 10/23/17 1027  WBC 3.9* 4.8  HGB 10.9* 10.7*  HCT 34.7* 33.9*  PLT 158 147*    COAGS: Recent Labs    10/23/17 1027  INR 1.07    BMP: Recent Labs    09/12/17 1102 10/20/17 1410 10/23/17 1027  NA 142 138 136  K 5.4* 5.7* 7.1*  CL 92* 93* 96*  CO2 25 28 24   GLUCOSE 85 123* 82  BUN 47* 72* 70*  CALCIUM 10.0 9.0 9.4  CREATININE 10.90* 15.54* 14.99*  GFRNONAA 5* 3* 3*  GFRAA 6* 4* 4*    LIVER FUNCTION TESTS: No results for  input(s): BILITOT, AST, ALT, ALKPHOS, PROT, ALBUMIN in the last 8760 hours.  TUMOR MARKERS: No results for input(s): AFPTM, CEA, CA199, CHROMGRNA in the last 8760 hours.  Assessment and Plan:  ESRD on hemodialysis with clotted left AV Fistula.  Critical hyperkalemia = 7.1  I spoke with Dr. Corliss Parish and she will arrange for patient to be admitted for hemodialysis today, then will plan for declot of the fistula tomorrow.  Risks and benefits discussed with the patient including, but not limited to bleeding, infection, vascular injury, pulmonary embolism, need for tunneled HD catheter placement or even death.  All of the patient's questions were answered, patient is agreeable to proceed. Consent signed and in chart.  Thank you for this interesting consult.  I greatly enjoyed meeting Jeremy Mccoy and look forward to participating in their care.  A copy of this report was sent to the requesting provider on this date.  Electronically Signed: Murrell Redden, PA-C   10/23/2017, 12:50 PM      I spent a total of  25 Minutes in face to face in clinical consultation, greater than 50% of which was counseling/coordinating care for fistula declot.

## 2017-10-23 NOTE — Procedures (Signed)
   I was present at this dialysis session, have reviewed the session itself and made  appropriate changes Kelly Splinter MD [place] pager [phone]   10/23/2017, 5:04 PM

## 2017-10-23 NOTE — ED Provider Notes (Signed)
[facility] EMERGENCY DEPARTMENT Provider Note   CSN: [id number] Arrival date & time: 10/23/17  1342     History   Chief Complaint Chief Complaint  Patient presents with  . Hyperkalemia    HPI Jeremy Mccoy is a 52 y.o. male.  Patient is a 52 year old male with end-stage renal disease on hemodialysis who presents with hyperkalemia.  He last received dialysis in the hospital 3 days ago.  He has not been able to use his fistula because it is clogged and he had a temporary dialysis catheter placed at that time.  He states he was supposed to come back today to get his fistula opened up.  His preop labs showed a potassium over 7.  He was sent to the emergency room for further evaluation.  He states he can tell that he needs dialysis.  He gets a funny taste in his mouth but otherwise he denies any symptoms.  He says his breathing is a little bit harder but he denies any specific shortness of breath.  No chest pain.  No nausea or vomiting.  No fevers or other recent illnesses.     Past Medical History:  Diagnosis Date  . Anemia   . Anxiety   . Arthritis    "qwhere" (05/15/2013)  . CHF (congestive heart failure) ([place])   . COPD (chronic obstructive pulmonary disease) ([place])   . Crack cocaine use   . Dental caries   . Depression   . End stage renal disease ([place]) 01/11/2012   Patient presented 11/04/11 to hospital with CP, wt loss and N/V. Creat was 10, admitted. 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 ANA was + 1:80, +MPO and +pr3 Ab's (ANCA). Received plasmapheresis x 7, IV cytoxan and pred taper. First HD was 11/17/11. 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 Pt d/c'd to do outpt HD and may have gotten 1-2 additional Cytoxan as OP, but this was stopped eventually since renal function didn't recover. Now gets HD TTS schedule at [facility].  L  forearm AVF 11/19/11 by Dr. Scot Dock is current access.    Marland Kitchen ESRD (end stage renal disease) on dialysis [facility])    TTS: [address] [place] (05/15/2013)  . ESRD (end stage renal disease) on dialysis ([place])   . Headache(784.0)    "get it from going to dialysis; when they get real bad I come to hospital" (05/15/2013)  . Headache(784.0)   . Hematemesis/vomiting blood   . History of blood transfusion 10/2011; 04/2013  . HTN (hypertension) 06/12/2012  . Renal insufficiency   . Seizures ([place])   . Shortness of breath    "just when I have too much potassium is too high" (05/15/2013)    Patient Active Problem List   Diagnosis Date Noted  . ESRD (end stage renal disease) on dialysis ([place]) 10/20/2017  . Plantar fasciitis, bilateral 09/12/2017  . Hyperkalemia 02/23/2016  . Coughing blood   . Vasculitis ([place])   . Lung crackles   . Hemoptysis 07/25/2015  . Anemia 02/20/2014  . Symptomatic anemia 02/20/2014  . PAF (paroxysmal atrial fibrillation) ([place]) 01/31/2014  . Chronic diastolic congestive heart failure ([place]) 01/31/2014  . COPD (chronic obstructive pulmonary disease) ([place]) 01/31/2014  . Thrombocytopenia ([place]) 01/23/2014  . Unspecified constipation 01/23/2014  . High anion gap metabolic acidosis 45/80/9983  . ESRD on hemodialysis ([place]) 11/05/2013  . Seizure disorder ([place])  11/04/2013  . Protein-calorie malnutrition, severe ([place]) 07/12/2013  . Respiratory failure ([place]) 07/10/2013  . Status epilepticus ([place]) 07/10/2013  . Essential hypertension 07/10/2013  . Acute blood loss anemia 05/15/2013  . Hematemesis 05/15/2013  . Volume excess 09/20/2012  . Abnormal EKG 09/20/2012  . Drug abuse ([place]) 09/20/2012  . Elevated troponin 09/20/2012  . Anemia in chronic kidney disease 09/20/2012  . Pulmonary edema 06/12/2012  . Cocaine abuse ([place]) 06/12/2012  . End stage renal disease ([place]) 01/11/2012  . Tobacco abuse 11/04/2011  . Dental caries 11/04/2011  . Color blindness 11/04/2011    Past  Surgical History:  Procedure Laterality Date  . AV FISTULA PLACEMENT  11/29/2011   Procedure: ARTERIOVENOUS (AV) FISTULA CREATION;  Surgeon: Angelia Mould, MD;  Location: [facility] OR;  Service: Vascular;  Laterality: Left;  . ESOPHAGOGASTRODUODENOSCOPY N/A 05/17/2013   Procedure: ESOPHAGOGASTRODUODENOSCOPY (EGD);  Surgeon: Beryle Beams, MD;  Location: [facility] ENDOSCOPY;  Service: Endoscopy;  Laterality: N/A;  . INGUINAL HERNIA REPAIR Bilateral 1967  . IR FLUORO GUIDE CV LINE RIGHT  10/20/2017  . IR US GUIDE VASC ACCESS RIGHT  10/20/2017  . RENAL BIOPSY  11/07/2011        Home Medications    Prior to Admission medications   Medication Sig Start Date End Date Taking? Authorizing Provider  acetaminophen (TYLENOL) 500 MG tablet Take 1 tablet (500 mg total) by mouth every 8 (eight) hours as needed for mild pain or fever. 6 times a day, 500 mg, 12 to 24 tabs a day, 6,000 to 12,000 mg daily for pain 08/05/16   Molt, Bethany, DO  amLODipine (NORVASC) 10 MG tablet Take 1 tablet (10 mg total) by mouth daily. 01/25/14   McLean-Scocuzza, Nino Glow, MD  aspirin 81 MG chewable tablet Chew 1 tablet (81 mg total) by mouth daily. 01/25/14   McLean-Scocuzza, Nino Glow, MD  carvedilol (COREG) 25 MG tablet Take 1.5 tablets (37.5 mg total) by mouth 2 (two) times daily with a meal. On Monday-Wednesday-Friday-Sunday (non-HD days); on dialysis days (Tuesday-Thursday-Saturday) take it once a day (evening time only). Patient taking differently: Take 25 mg by mouth 2 (two) times daily with a meal.  01/30/14   Croitoru, Mihai, MD  cinacalcet (SENSIPAR) 60 MG tablet Take 60 mg by mouth daily.     [provider]  cloNIDine (CATAPRES) 0.3 MG tablet Take 0.3 mg by mouth 3 (three) times daily.     [provider]  hydrOXYzine (ATARAX/VISTARIL) 25 MG tablet Take 25 mg by mouth 3 (three) times daily as needed for itching.  06/10/15   [provider]  losartan (COZAAR) 100 MG tablet Take 100 mg by mouth at bedtime.  08/02/16   [provider]  multivitamin (RENA-VIT) TABS tablet Take 1 tablet by mouth at bedtime. 11/29/11   Alric Seton, PA-C  Nutritional Supplements (FEEDING SUPPLEMENT, NEPRO CARB STEADY,) LIQD Take 237 mLs by mouth 3 (three) times daily as needed (Supplement). 05/17/13   Mikhail, Velta Addison, DO  pantoprazole (PROTONIX) 20 MG tablet Take 20 mg by mouth daily. 02/21/16   [provider]  polyethylene glycol (MIRALAX / GLYCOLAX) packet Take 17 g by mouth daily as needed for mild constipation. 08/05/16   Molt, Bethany, DO  ranitidine (ZANTAC) 300 MG tablet Take 300 mg by mouth daily as needed for heartburn.    [provider]  sevelamer carbonate (RENVELA) 800 MG tablet Take 2 tablets (1,600 mg total) by mouth 3 (three) times daily with meals. Patient taking differently: Take  2,400 mg by mouth 3 (three) times daily with meals. And with snacks 01/25/14   McLean-Scocuzza, Nino Glow, MD    Family History Family History  Problem Relation Age of Onset  . Heart attack Father     Social History Social History   Tobacco Use  . Smoking status: Current Every Day Smoker    Packs/day: 1.00    Years: 35.00    Pack years: 35.00    Types: Cigars, Cigarettes  . Smokeless tobacco: Never Used  Substance Use Topics  . Alcohol use: No    Comment: 05/15/2013 "aien't drank in ~ 25 yrs"  . Drug use: Yes    Types: Marijuana, Cocaine    Comment: Precription drugs (e.g. percocet) "I take what I have to to controll my constant pain" (05/15/2013)     Allergies   Patient has no known allergies.   Review of Systems Review of Systems  Constitutional: Negative for chills, diaphoresis, fatigue and fever.  HENT: Negative for congestion, rhinorrhea and sneezing.   Eyes: Negative.   Respiratory: Negative for cough, chest tightness and shortness of breath.   Cardiovascular: Negative for chest pain and leg swelling.  Gastrointestinal: Negative for abdominal pain, blood in stool,  diarrhea, nausea and vomiting.  Genitourinary: Negative for difficulty urinating, flank pain, frequency and hematuria.  Musculoskeletal: Negative for arthralgias and back pain.  Skin: Negative for rash.  Neurological: Negative for dizziness, speech difficulty, weakness, numbness and headaches.     Physical Exam Updated Vital Signs BP (!) 157/97 (BP Location: Right Arm)   Pulse 67   Temp 98.1 F (36.7 C) (Oral)   Resp 17   Ht 6' (1.829 m)   Wt 72.6 kg (160 lb)   SpO2 98%   BMI 21.70 kg/m   Physical Exam  Constitutional: He is oriented to person, place, and time. He appears well-developed and well-nourished.  HENT:  Head: Normocephalic and atraumatic.  Eyes: Pupils are equal, round, and reactive to light.  Neck: Normal range of motion. Neck supple.  Cardiovascular: Normal rate, regular rhythm and normal heart sounds.  Pulmonary/Chest: Effort normal and breath sounds normal. No respiratory distress. He has no wheezes. He has no rales. He exhibits no tenderness.  Abdominal: Soft. Bowel sounds are normal. There is no tenderness. There is no rebound and no guarding.  Musculoskeletal: Normal range of motion. He exhibits no edema.  Lymphadenopathy:    He has no cervical adenopathy.  Neurological: He is alert and oriented to person, place, and time.  Skin: Skin is warm and dry. No rash noted.  Psychiatric: He has a normal mood and affect.     ED Treatments / Results  Labs (all labs ordered are listed, but only abnormal results are displayed) Labs Reviewed - No data to display  EKG EKG Interpretation  Date/Time:  Monday October 23 2017 14:00:14 EDT Ventricular Rate:  71 PR Interval:    QRS Duration: 98 QT Interval:  406 QTC Calculation: 442 R Axis:   -59 Text Interpretation:  Sinus rhythm Consider left atrial enlargement Left anterior fascicular block Anterior infarct, old Abnormal T, consider ischemia, lateral leads peaked T waves Confirmed by Malvin Johns [phone]) on  10/23/2017 2:07:56 PM   Radiology No results found.  Procedures Procedures (including critical care time)  Medications Ordered in ED Medications  Chlorhexidine Gluconate Cloth 2 % PADS 6 each (has no administration in time range)  doxercalciferol (HECTOROL) injection 5 mcg (has no administration in time range)  calcium gluconate 1 g  in sodium chloride 0.9 % 100 mL IVPB (has no administration in time range)  albuterol (PROVENTIL) (2.5 MG/3ML) 0.083% nebulizer solution 10 mg (has no administration in time range)  dextrose 50 % solution 50 mL (has no administration in time range)  insulin aspart (novoLOG) injection 10 Units (has no administration in time range)     Initial Impression / Assessment and Plan / ED Course  I have reviewed the triage vital signs and the nursing notes.  Pertinent labs & imaging results that were available during my care of the patient were reviewed by me and considered in my medical decision making (see chart for details).     Patient is a 52 year old male who is a dialysis patient.  His last dialysis was 3 days ago.  He was supposed to get his fistula declotted today but his potassium is found to be 7.1.  I did order medications for stabilization given his hyperkalemia although they have called for him to go directly to dialysis.  His EKG shows peaked T waves.  This was reviewed by me.  I spoke with Dr. Jonnie Finner with nephrology who requests admission to the medicine service as an observation patient and he will get his AV fistula declotted tomorrow.  I spoke with Dr. Kris Mouton with the family medicine service he will admit the patient.  CRITICAL CARE Performed by: Malvin Johns Total critical care time: 25 minutes Critical care time was exclusive of separately billable procedures and treating other patients. Critical care was necessary to treat or prevent imminent or life-threatening deterioration. Critical care was time spent personally by me on the following  activities: development of treatment plan with patient and/or surrogate as well as nursing, discussions with consultants, evaluation of patient's response to treatment, examination of patient, obtaining history from patient or surrogate, ordering and performing treatments and interventions, ordering and review of laboratory studies, ordering and review of radiographic studies, pulse oximetry and re-evaluation of patient's condition.   Final Clinical Impressions(s) / ED Diagnoses   Final diagnoses:  ESRD (end stage renal disease) ([place])  Hyperkalemia    ED Discharge Orders    None       Malvin Johns, MD 10/23/17 1438

## 2017-10-24 ENCOUNTER — Observation Stay (HOSPITAL_COMMUNITY): Payer: Medicare Other

## 2017-10-24 ENCOUNTER — Encounter (HOSPITAL_COMMUNITY): Payer: Self-pay | Admitting: Interventional Radiology

## 2017-10-24 DIAGNOSIS — E875 Hyperkalemia: Secondary | ICD-10-CM | POA: Diagnosis not present

## 2017-10-24 DIAGNOSIS — N186 End stage renal disease: Secondary | ICD-10-CM | POA: Diagnosis not present

## 2017-10-24 DIAGNOSIS — T82868A Thrombosis of vascular prosthetic devices, implants and grafts, initial encounter: Secondary | ICD-10-CM | POA: Diagnosis not present

## 2017-10-24 HISTORY — PX: IR US GUIDE VASC ACCESS LEFT: IMG2389

## 2017-10-24 HISTORY — PX: IR THROMBECTOMY AV FISTULA W/THROMBOLYSIS/PTA INC/SHUNT/IMG LEFT: IMG6106

## 2017-10-24 LAB — HIV ANTIBODY (ROUTINE TESTING W REFLEX): HIV SCREEN 4TH GENERATION: NONREACTIVE

## 2017-10-24 LAB — RENAL FUNCTION PANEL
Albumin: 3.7 g/dL (ref 3.5–5.0)
Anion gap: 12 (ref 5–15)
BUN: 48 mg/dL — ABNORMAL HIGH (ref 6–20)
CALCIUM: 9.2 mg/dL (ref 8.9–10.3)
CHLORIDE: 101 mmol/L (ref 101–111)
CO2: 26 mmol/L (ref 22–32)
Creatinine, Ser: 10.57 mg/dL — ABNORMAL HIGH (ref 0.61–1.24)
GFR calc Af Amer: 6 mL/min — ABNORMAL LOW (ref >60)
GFR, EST NON AFRICAN AMERICAN: 5 mL/min — AB (ref >60)
Glucose, Bld: 89 mg/dL (ref 65–99)
Phosphorus: 6.5 mg/dL — ABNORMAL HIGH (ref 2.5–4.6)
Potassium: 5.6 mmol/L — ABNORMAL HIGH (ref 3.5–5.1)
Sodium: 139 mmol/L (ref 135–145)

## 2017-10-24 LAB — CBC WITH DIFFERENTIAL/PLATELET
Abs Immature Granulocytes: 0 10*3/uL (ref 0.0–0.1)
Basophils Absolute: 0 10*3/uL (ref 0.0–0.1)
Basophils Relative: 1 %
EOS ABS: 0 10*3/uL (ref 0.0–0.7)
Eosinophils Relative: 1 %
HEMATOCRIT: 35.8 % — AB (ref 39.0–52.0)
Hemoglobin: 11.1 g/dL — ABNORMAL LOW (ref 13.0–17.0)
Immature Granulocytes: 1 %
LYMPHS ABS: 1 10*3/uL (ref 0.7–4.0)
Lymphocytes Relative: 24 %
MCH: 31.9 pg (ref 26.0–34.0)
MCHC: 31 g/dL (ref 30.0–36.0)
MCV: 102.9 fL — AB (ref 78.0–100.0)
MONOS PCT: 11 %
Monocytes Absolute: 0.5 10*3/uL (ref 0.1–1.0)
Neutro Abs: 2.8 10*3/uL (ref 1.7–7.7)
Neutrophils Relative %: 64 %
Platelets: 152 10*3/uL (ref 150–400)
RBC: 3.48 MIL/uL — AB (ref 4.22–5.81)
RDW: 14.4 % (ref 11.5–15.5)
WBC: 4.4 10*3/uL (ref 4.0–10.5)

## 2017-10-24 LAB — APTT: aPTT: 40 seconds — ABNORMAL HIGH (ref 24–36)

## 2017-10-24 LAB — PROTIME-INR
INR: 1.11
Prothrombin Time: 14.2 seconds (ref 11.4–15.2)

## 2017-10-24 MED ORDER — HEPARIN SODIUM (PORCINE) 1000 UNIT/ML IJ SOLN
INTRAMUSCULAR | Status: AC
  Filled 2017-10-24: qty 1

## 2017-10-24 MED ORDER — MIDAZOLAM HCL 2 MG/2ML IJ SOLN
INTRAMUSCULAR | Status: AC
  Filled 2017-10-24: qty 8

## 2017-10-24 MED ORDER — SEVELAMER CARBONATE 800 MG PO TABS
1600.0000 mg | ORAL_TABLET | Freq: Three times a day (TID) | ORAL | Status: DC
  Administered 2017-10-25 – 2017-10-26 (×3): 1600 mg via ORAL
  Filled 2017-10-24 (×3): qty 2

## 2017-10-24 MED ORDER — CHLORHEXIDINE GLUCONATE CLOTH 2 % EX PADS: 6.0000 | MEDICATED_PAD | Freq: Every day | CUTANEOUS | Status: DC

## 2017-10-24 MED ORDER — MIDAZOLAM HCL 2 MG/2ML IJ SOLN
INTRAMUSCULAR | Status: AC | PRN
  Administered 2017-10-24 (×7): 1 mg via INTRAVENOUS

## 2017-10-24 MED ORDER — FENTANYL CITRATE (PF) 100 MCG/2ML IJ SOLN
INTRAMUSCULAR | Status: AC
  Filled 2017-10-24: qty 4

## 2017-10-24 MED ORDER — FENTANYL CITRATE (PF) 100 MCG/2ML IJ SOLN
INTRAMUSCULAR | Status: AC | PRN
  Administered 2017-10-24 (×4): 50 ug via INTRAVENOUS

## 2017-10-24 MED ORDER — HEPARIN SODIUM (PORCINE) 1000 UNIT/ML IJ SOLN
INTRAMUSCULAR | Status: AC | PRN
  Administered 2017-10-24: 5000 [IU] via INTRAVENOUS

## 2017-10-24 MED ORDER — HYDROCODONE-ACETAMINOPHEN 5-325 MG PO TABS
1.0000 | ORAL_TABLET | ORAL | Status: DC | PRN
  Administered 2017-10-24 (×3): 2 via ORAL
  Filled 2017-10-24 (×4): qty 2

## 2017-10-24 MED ORDER — RENA-VITE PO TABS
1.0000 | ORAL_TABLET | Freq: Every day | ORAL | Status: DC
  Administered 2017-10-24: 1 via ORAL
  Filled 2017-10-24 (×2): qty 1

## 2017-10-24 MED ORDER — IOPAMIDOL (ISOVUE-300) INJECTION 61%
INTRAVENOUS | Status: AC
  Administered 2017-10-24: 50 mL
  Filled 2017-10-24: qty 100

## 2017-10-24 MED ORDER — DIPHENHYDRAMINE HCL 50 MG/ML IJ SOLN
INTRAMUSCULAR | Status: AC | PRN
  Administered 2017-10-24: 12.5 mg via INTRAVENOUS

## 2017-10-24 MED ORDER — DIPHENHYDRAMINE HCL 50 MG/ML IJ SOLN
INTRAMUSCULAR | Status: AC
  Filled 2017-10-24: qty 1

## 2017-10-24 MED ORDER — LIDOCAINE HCL 1 % IJ SOLN
INTRAMUSCULAR | Status: AC
  Filled 2017-10-24: qty 20

## 2017-10-24 MED ORDER — ALTEPLASE 2 MG IJ SOLR
INTRAMUSCULAR | Status: AC | PRN
  Administered 2017-10-24 (×2): 2 mg

## 2017-10-24 NOTE — Sedation Documentation (Signed)
Patient is resting comfortably. 

## 2017-10-24 NOTE — Progress Notes (Signed)
Late entry: New Admission Note:  Arrival Method: Via bed from ED Mental Orientation: Alert & Oriented X4 Telemetry: CCMD verified.  Assessment: Completed Skin: Refer to flowsheet IV: Right Forearm Pain: 0/10 Tubes: Safety Measures: Safety Fall Prevention Plan discussed with patient. Admission: Completed 5 [place] Orientation: Patient has been orientated to the room, unit and the staff.  Orders have been reviewed and are being implemented. Will continue to monitor the patient. Call light has been placed within reach.   Vassie Moselle, RN  Phone Number: [phone]

## 2017-10-24 NOTE — Progress Notes (Signed)
Family Medicine Teaching Service Daily Progress Note Intern Pager: [phone]  Patient name: Jeremy Mccoy Medical record number: [id number] Date of birth: 05/22/1965 Age: 52 y.o. Gender: male  Primary Care Provider: Patient, No Pcp Per Consultants: Nephrology, Interventional Radiology Code Status: full  Pt Overview and Major Events to Date:  6/10 Admitted to FPTS, received HD 6/11 Declot procedure with IR  Assessment and Plan: Jeremy Mccoy is a 52 y.o. male presenting with electrolyte abnormalities 2/2 a clogged LUE AV fistula.  Hyperkalemia (resolved) Admission K 7.1. After HD now 4.5 in am of 6/11. Appreciate nephrology input. Likely home this PM after has declotting procedure. No indication for pharmacologic treatment of hyperkalemia at this time. - vital signs per telemetry routine - cardiac monitoring - Nephrology following, appreciate their recs - Heparin for dvt ppx - NPO until procedure  ESRD HD on 6/10. Electrolyte abnormalities corrected this am. Appreciate nephrology input. Likely have LUE AV fistula declotted and will be able to dc after. - nephrology following appreciate their recs - dialysis MWF - NPO until after procedure - With IR this am for LUE thrombectomy vs thrombolysis vs angiplasty - NPO at midnight  Anemia Likely 2/2 ESRD. hgb 10.7 on admission. UP to 11.1 in ma of 6/11. Well above threshold for transfusion. EPO with dialysis. - daily cbc - epo with dialysis  Hypertension Well controlled on current regimen.  Elevated during dialysis up to 160/109. 133/100 this am. Takes norvasc, coreg, clonidine, losartan. Will hold patient's coreg while inpatient due to cocaine abuse. Will hold this medication in discharge due to contraindication in cocaine abuse. - continue norvasc 10mg  - continue clonidine 0.3mg  tid - continue losartan 100mg  daily - holding coreg - hold coreg on discharge  HFpEF Echo from 2015 showing EF of 50-55%. Could likely  benefit from another echo but do not think this is useful as inpatient. Already on most medications indicated for HFrEF if he is shown to have that at some pain. Could add spiro if EF drops below 45%. - continue norvasc, clonidine, losartan - holding coreg - consider repeat echo as outpatient  Cocaine Abuse Will hold home coreg in setting of cocaine abuse. 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 Will need counseling as outpatient. Has been using 28 years.  FEN/GI: NPO PPx: heparin  Disposition: home  Subjective:  Doing well this morning. Ready to get fistula declotted so he can go home  Objective: Temp:  [97.6 F (36.4 C)-99.7 F (37.6 C)] 97.8 F (36.6 C) (06/11 0834) Pulse Rate:  [63-89] 72 (06/11 0834) Resp:  [14-24] 14 (06/11 0834) BP: (133-167)/(96-120) 133/100 (06/11 0834) SpO2:  [96 %-100 %] 100 % (06/11 0834) Weight:  [154 lb 1.6 oz (69.9 kg)-163 lb 9.3 oz (74.2 kg)] 156 lb 1.4 oz (70.8 kg) (06/11 0130) Physical Exam: General: thin, caucasian male, resting comfortably in bed. No acute distress Cardiovascular: regular rate rhythm, no murmurs/rubs/gallops. LUE radiocephalic fistula without thrill, arterial flow noted in distal distribution of radial artery. R IJ HD cath in place. Respiratory: lungs clear to ausculation bilaterally, no increased work of breathing Gastrointestinal: soft, NT, ND MSK: 5/5 strength BUE, BLE.  Psych: appropriate  Laboratory: Recent Labs  Lab 10/23/17 1027 10/23/17 1624 10/24/17 0753  WBC 4.8 4.5 4.4  HGB 10.7* 11.2* 11.1*  HCT 33.9* 35.6* 35.8*  PLT 147* 158 152   Recent Labs  Lab 10/23/17 1027 10/23/17 1623 10/24/17 0753  NA 136 138 139  K 7.1* 4.5 5.6*  CL 96* 98*  101  CO2 24 28 26   BUN 70* 43* 48*  CREATININE 14.99* 9.55* 10.57*  CALCIUM 9.4 8.9 9.2  GLUCOSE 82 135* 89    Imaging/Diagnostic Tests: none  Guadalupe Dawn, MD 10/24/2017, 9:26 AM PGY-1, [place] Intern pager: [phone],  text pages welcome

## 2017-10-24 NOTE — Procedures (Signed)
Interventional Radiology Procedure Note  Procedure: Declot of LUE radiocephalic AVF  Complications: Left forearm hematoma  Estimated Blood Loss: 25 mL  Recommendations: - May use for dialysis - Out-pt vascular surgery consult to begin planning for a new access site as this one has chronic calcifications and may continue to have issues going forward.    Signed,  Criselda Peaches, MD

## 2017-10-24 NOTE — Progress Notes (Signed)
Patient ID: Jeremy Mccoy, male   DOB: 1965-12-29, 52 y.o.   MRN: [id number]   Scheduled for left arm dialysis fistula with possible angioplasty/stent placement. Possible tunneled dialysis catheter placement.  Risks and benefits discussed with the patient including, but not limited to bleeding, infection, vascular injury, pulmonary embolism, need for tunneled HD catheter placement or even death. All of the patient's questions were answered, patient is agreeable to proceed. Consent signed and in chart.

## 2017-10-24 NOTE — Sedation Documentation (Signed)
Dsg L arm intact

## 2017-10-24 NOTE — Sedation Documentation (Signed)
Pt very agitated by 02 [place]- will take off for minute and make sure pt maintains sats

## 2017-10-24 NOTE — Discharge Instructions (Signed)
You were admitted because your potassium level was very high. You were treated with dialysis. You then got your AV fistula unclogged the following morning. Everything went well with the procedure, please resume dialysis on your normal schedule.  Because of the cocaine the coreg is contraindicated. This interaction can cause your blood pressure to increase tremendously and cause serious side effects. We are stopping the coreg, you should follow up with your pcp regarding a replacement medication for this.

## 2017-10-25 DIAGNOSIS — T82858A Stenosis of vascular prosthetic devices, implants and grafts, initial encounter: Secondary | ICD-10-CM | POA: Diagnosis not present

## 2017-10-25 DIAGNOSIS — I5032 Chronic diastolic (congestive) heart failure: Secondary | ICD-10-CM | POA: Diagnosis present

## 2017-10-25 DIAGNOSIS — Z992 Dependence on renal dialysis: Secondary | ICD-10-CM | POA: Diagnosis not present

## 2017-10-25 DIAGNOSIS — N2581 Secondary hyperparathyroidism of renal origin: Secondary | ICD-10-CM | POA: Diagnosis present

## 2017-10-25 DIAGNOSIS — J449 Chronic obstructive pulmonary disease, unspecified: Secondary | ICD-10-CM | POA: Diagnosis present

## 2017-10-25 DIAGNOSIS — N186 End stage renal disease: Secondary | ICD-10-CM | POA: Diagnosis not present

## 2017-10-25 DIAGNOSIS — Z7982 Long term (current) use of aspirin: Secondary | ICD-10-CM | POA: Diagnosis not present

## 2017-10-25 DIAGNOSIS — I132 Hypertensive heart and chronic kidney disease with heart failure and with stage 5 chronic kidney disease, or end stage renal disease: Secondary | ICD-10-CM | POA: Diagnosis present

## 2017-10-25 DIAGNOSIS — Y832 Surgical operation with anastomosis, bypass or graft as the cause of abnormal reaction of the patient, or of later complication, without mention of misadventure at the time of the procedure: Secondary | ICD-10-CM | POA: Diagnosis present

## 2017-10-25 DIAGNOSIS — Z8249 Family history of ischemic heart disease and other diseases of the circulatory system: Secondary | ICD-10-CM | POA: Diagnosis not present

## 2017-10-25 DIAGNOSIS — D631 Anemia in chronic kidney disease: Secondary | ICD-10-CM | POA: Diagnosis present

## 2017-10-25 DIAGNOSIS — T82868A Thrombosis of vascular prosthetic devices, implants and grafts, initial encounter: Secondary | ICD-10-CM | POA: Diagnosis not present

## 2017-10-25 DIAGNOSIS — F1410 Cocaine abuse, uncomplicated: Secondary | ICD-10-CM | POA: Diagnosis present

## 2017-10-25 DIAGNOSIS — F17210 Nicotine dependence, cigarettes, uncomplicated: Secondary | ICD-10-CM | POA: Diagnosis present

## 2017-10-25 DIAGNOSIS — E875 Hyperkalemia: Secondary | ICD-10-CM | POA: Diagnosis not present

## 2017-10-25 LAB — BASIC METABOLIC PANEL
Anion gap: 14 (ref 5–15)
Anion gap: 13 (ref 5–15)
BUN: 69 mg/dL — ABNORMAL HIGH (ref 6–20)
BUN: 40 mg/dL — ABNORMAL HIGH (ref 6–20)
CO2: 22 mmol/L (ref 22–32)
CO2: 29 mmol/L (ref 22–32)
Calcium: 8.9 mg/dL (ref 8.9–10.3)
Calcium: 10.1 mg/dL (ref 8.9–10.3)
Chloride: 97 mmol/L — ABNORMAL LOW (ref 101–111)
Chloride: 93 mmol/L — ABNORMAL LOW (ref 101–111)
Creatinine, Ser: 12.88 mg/dL — ABNORMAL HIGH (ref 0.61–1.24)
Creatinine, Ser: 8.89 mg/dL — ABNORMAL HIGH (ref 0.61–1.24)
GFR calc Af Amer: 4 mL/min — ABNORMAL LOW (ref >60)
GFR calc Af Amer: 7 mL/min — ABNORMAL LOW (ref >60)
GFR calc non Af Amer: 4 mL/min — ABNORMAL LOW (ref >60)
GFR calc non Af Amer: 6 mL/min — ABNORMAL LOW (ref >60)
Glucose, Bld: 86 mg/dL (ref 65–99)
Glucose, Bld: 76 mg/dL (ref 65–99)
Potassium: 6.9 mmol/L (ref 3.5–5.1)
Potassium: 5.1 mmol/L (ref 3.5–5.1)
Sodium: 133 mmol/L — ABNORMAL LOW (ref 135–145)
Sodium: 135 mmol/L (ref 135–145)

## 2017-10-25 LAB — CBC
HCT: 30.6 % — ABNORMAL LOW (ref 39.0–52.0)
Hemoglobin: 9.9 g/dL — ABNORMAL LOW (ref 13.0–17.0)
MCH: 32.5 pg (ref 26.0–34.0)
MCHC: 32.4 g/dL (ref 30.0–36.0)
MCV: 100.3 fL — AB (ref 78.0–100.0)
Platelets: 124 10*3/uL — ABNORMAL LOW (ref 150–400)
RBC: 3.05 MIL/uL — ABNORMAL LOW (ref 4.22–5.81)
RDW: 14.3 % (ref 11.5–15.5)
WBC: 4.4 10*3/uL (ref 4.0–10.5)

## 2017-10-25 LAB — RENAL FUNCTION PANEL
Albumin: 3.5 g/dL (ref 3.5–5.0)
Anion gap: 12 (ref 5–15)
BUN: 69 mg/dL — ABNORMAL HIGH (ref 6–20)
CALCIUM: 8.8 mg/dL — AB (ref 8.9–10.3)
CO2: 23 mmol/L (ref 22–32)
CREATININE: 12.83 mg/dL — AB (ref 0.61–1.24)
Chloride: 98 mmol/L — ABNORMAL LOW (ref 101–111)
GFR, EST AFRICAN AMERICAN: 5 mL/min — AB (ref >60)
GFR, EST NON AFRICAN AMERICAN: 4 mL/min — AB (ref >60)
Glucose, Bld: 87 mg/dL (ref 65–99)
Phosphorus: 7 mg/dL — ABNORMAL HIGH (ref 2.5–4.6)
Potassium: 6.9 mmol/L (ref 3.5–5.1)
SODIUM: 133 mmol/L — AB (ref 135–145)

## 2017-10-25 LAB — MRSA PCR SCREENING: MRSA by PCR: NEGATIVE

## 2017-10-25 MED ORDER — LIDOCAINE-PRILOCAINE 2.5-2.5 % EX CREA: 1.0000 "application " | TOPICAL_CREAM | CUTANEOUS | Status: DC | PRN

## 2017-10-25 MED ORDER — DOXERCALCIFEROL 4 MCG/2ML IV SOLN
INTRAVENOUS | Status: AC
  Filled 2017-10-25: qty 4

## 2017-10-25 MED ORDER — PENTAFLUOROPROP-TETRAFLUOROETH EX AERO: 1.0000 "application " | INHALATION_SPRAY | CUTANEOUS | Status: DC | PRN

## 2017-10-25 MED ORDER — HEPARIN SODIUM (PORCINE) 1000 UNIT/ML DIALYSIS: 1000.0000 [IU] | INTRAMUSCULAR | Status: DC | PRN

## 2017-10-25 MED ORDER — ALTEPLASE 2 MG IJ SOLR: 2.0000 mg | Freq: Once | INTRAMUSCULAR | Status: DC | PRN

## 2017-10-25 MED ORDER — SODIUM CHLORIDE 0.9 % IV SOLN: 100.0000 mL | INTRAVENOUS | Status: DC | PRN

## 2017-10-25 MED ORDER — LIDOCAINE HCL (PF) 1 % IJ SOLN: 5.0000 mL | INTRAMUSCULAR | Status: DC | PRN

## 2017-10-25 MED ORDER — CHLORHEXIDINE GLUCONATE CLOTH 2 % EX PADS: 6.0000 | MEDICATED_PAD | Freq: Every day | CUTANEOUS | Status: DC

## 2017-10-25 NOTE — Progress Notes (Addendum)
[facility] Progress Note   Dialysis Orders: MWF 4.25 hr 500/800 EDW 69 2 K 2.25 Ca left lowr AVF hectorol 5 Parsabiv 12.5 venofer 50/week mircera 100 q 2 weeks  Assessment/Plan: 1. Hyperkalemia- poor clearances with temp cath during Monday dialysis K up to 6.9 today - s/p declot yesterday - using AVF flows only Qb 250 limited by arterial pressure- use 1 K for entire treatment due to poor kinetics- 2. ESRD - MWF - will have full HD tmt today 3. Anemia - hgb 9.9  4. Secondary hyperparathyroidism - ^ P ^ renvela to 3 ac 5. HTN/volume - net U 5.2 Monday - goal for EDW today. Notoriously signs off early at about 2.5 hr at the outpt unit. 6. Nutrition - alb 3.5 - large IDWG 7. Clotted AVF - s/p declot 6/1 by IR ; marginal function today - PT of calcific 80% stenosis of prox draining ceph vein and 50% mod stenosis of draining ceph vein in prox forearm. IR rec further access planning due to vessel calcification.  It doesn't appear access is going to work adequately.  Have discussed with Dr. Jonnie Finner - ask VVS to see and place need access/access planning.  Have ordered vein mappaing.  Myriam Jacobson, PA-C [place] [phone] 10/25/2017,8:04 AM  LOS: 0 days   Pt seen, examined and agree w A/P as above. AVF not working well after declot yesterday, will consult VVS for recommendations. HD today.  Kelly Splinter MD [employer] pager [phone]   10/25/2017, 12:30 PM    Subjective:   Agrees to have VVS see him. Encouraged to run full treatment to get K down.  Objective Vitals:   10/24/17 2034 10/24/17 2246 10/25/17 0455 10/25/17 0507  BP: 128/82 (!) 142/106  (!) 139/98  Pulse: 78 71  68  Resp: 16   16  Temp: 98.6 F (37 C) 98.2 F (36.8 C)  98.1 F (36.7 C)  TempSrc: Oral Oral  Oral  SpO2: 99% 99%  99%  Weight: 73.6 kg (162 lb 3.2 oz)  73.6 kg (162 lb 4.1 oz)   Height:       Physical Exam General: NAD  Heart: RRR Lungs: grossly  clear Abdomen: soft NT Extremities: no sig edema Dialysis Access: left lower AVF mild edema of forearm and min edema of hand   Additional Objective Labs: Basic Metabolic Panel: Recent Labs  Lab 10/23/17 1623 10/24/17 0753 10/25/17 0558 10/25/17 0627  NA 138 139 133* 133*  K 4.5 5.6* 6.9* 6.9*  CL 98* 101 98* 97*  CO2 28 26 23 22   GLUCOSE 135* 89 87 86  BUN 43* 48* 69* 69*  CREATININE 9.55* 10.57* 12.83* 12.88*  CALCIUM 8.9 9.2 8.8* 8.9  PHOS 3.0 6.5* 7.0*  --    Liver Function Tests: Recent Labs  Lab 10/23/17 1623 10/24/17 0753 10/25/17 0558  ALBUMIN 4.0 3.7 3.5   No results for input(s): LIPASE, AMYLASE in the last 168 hours. CBC: Recent Labs  Lab 10/20/17 1410 10/23/17 1027 10/23/17 1624 10/24/17 0753  WBC 3.9* 4.8 4.5 4.4  NEUTROABS 2.6  --   --  2.8  HGB 10.9* 10.7* 11.2* 11.1*  HCT 34.7* 33.9* 35.6* 35.8*  MCV 101.8* 101.8* 101.7* 102.9*  PLT 158 147* 158 152   Blood Culture    Component Value Date/Time   SDES BLOOD RIGHT ARM 02/24/2016 0950   SPECREQUEST BOTTLES DRAWN AEROBIC AND ANAEROBIC 5CC EACH 02/24/2016 0950   CULT NO GROWTH 5  DAYS 02/24/2016 0950   REPTSTATUS 02/29/2016 FINAL 02/24/2016 0950    Cardiac Enzymes: No results for input(s): CKTOTAL, CKMB, CKMBINDEX, TROPONINI in the last 168 hours. CBG: No results for input(s): GLUCAP in the last 168 hours. Iron Studies: No results for input(s): IRON, TIBC, TRANSFERRIN, FERRITIN in the last 72 hours. Lab Results  Component Value Date   INR 1.11 10/24/2017   INR 1.07 10/23/2017   INR 1.26 08/02/2016   Studies/Results: Ir US Guide Vasc Access Left  Result Date: 10/24/2017 INDICATION: 52 year old male with end-stage renal disease on hemodialysis via a left upper extremity radiocephalic arteriovenous fistula. He has had the fistula worked on once before at [facility] and underwent an angioplasty. His fistula clotted last Thursday. He presents today for attempted declot procedure. EXAM: 1.  Ultrasound-guided vascular access antegrade 2. Ultrasound-guided vascular access retrograde 3. Declot procedure 4. Angioplasty to 8 mm MEDICATIONS: 4 mg tPA, 5000 units heparin, 7 mg Versed, 200 mcg fentanyl ANESTHESIA/SEDATION: Moderate Sedation Time:  90 minutes The patient was continuously monitored during the procedure by the interventional radiology nurse under my direct supervision. FLUOROSCOPY TIME:  Fluoroscopy Time: 23 minutes 36 seconds (13 mGy). COMPLICATIONS: None immediate. PROCEDURE: Informed written consent was obtained from the patient after a thorough discussion of the procedural risks, benefits and alternatives. All questions were addressed. Maximal Sterile Barrier Technique was utilized including caps, mask, sterile gowns, sterile gloves, sterile drape, hand hygiene and skin antiseptic. A timeout was performed prior to the initiation of the procedure. The left forearm fistula was interrogated with ultrasound and found to be thrombosed. There is echogenic calcified thrombus along the walls of the fistula. The arterial anastomosis remains patent. An image was obtained and stored for the medical record. Local anesthesia was attained by infiltration with 1% lidocaine. A small dermatotomy was made. Under real-time sonographic guidance, the vessel was punctured in antegrade fashion with a 21 gauge micropuncture needle. Using standard technique, the initial micro needle was exchanged over a 0.018 micro wire for a transitional 4 [place] micro sheath. 2 mg tPA were then administered into the thrombosed fistula. The micro sheath was then exchanged over the 0.035 wire for a 6 French vascular sheath. Using the same technique, a second retrograde access was obtained further up the forearm. Once the 4 [place] micro sheath was introduced into the fistula, an additional 2 mg tPA were administered. The 4 French micro sheath was then exchanged over a 0.035 wire for a working 6 [place] vascular sheath. An angled  catheter was advanced over a Glidewire into the left subclavian vein. Central venography was performed. The central veins are widely patent. A pull-back venogram was performed. No stenosis visible in the upper arm. The fistula becomes thrombosed at the level of the proximal forearm. The angled catheter was then introduced from the retrograde approach and successfully navigated into the right radial artery. An arteriogram was performed. Heavily calcified thrombus is present in the most proximal aspect of the draining cephalic vein resulting in an approximately 80% stenosis. This appears to be the flow limiting occlusion. Multiple techniques were used including an over the wire 5 [place] Fogarty balloon, as well as balloon maceration and angioplasty first to 6 mm, and then to 8 mm using a 6 x 40 and then an 8 x 40 mustang balloon. The aero percutaneous thrombectomy device was also used in an effort to disrupt the chronic thrombus. Intermittently, both the antegrade and retrograde sheaths were aspirated and thrombus was removed. The 5 [place] catheter  was reintroduced into the radial artery and an arteriogram was performed. There is now antegrade flow through the arteriovenous fistula. There is a region of focal approximately 50% stenosis in the proximal forearm. This was successfully angioplastied to 8 mm using the 8 x 40 mm Conquest balloon. Follow-up venography demonstrates no significant residual stenosis and no significant residual thrombus. Flow is brisk. There is a palpable thrill. There is a focal soft tissue hematoma at the retrograde sheath access site. Hemostasis was attained through a combination of manual pressure and a 2 0 nylon pursestring suture. The antegrade sheath was also removed. Hemostasis was attained with the nylon pursestring suture. IMPRESSION: 1. Calcified chronic thrombus and calcific wall thickening present in the proximal draining cephalic vein in the distal forearm. 2. Moderate focal  stenosis of the cephalic vein in the proximal forearm. 3. Successful declot procedure. 4. Angioplasty of the calcific stenosis (80%) in the proximal cephalic vein at the distal forearm to 8 mm. 5. Angioplasty of the moderate stenosis (50%) of the draining cephalic vein in the proximal forearm to 8 mm. ACCESS: Barring early rethrombosis, this access remains amenable to future percutaneous interventions as clinically indicated. However, due to the degree of underlying vessel calcification, consideration should also be given to vascular surgery referral to discuss the possibility of new access site planning. Signed, Criselda Peaches, MD [facility] Radiology Electronically Signed   By: Jacqulynn Cadet M.D.   On: 10/24/2017 12:28   Ir Thrombectomy Av Fistula W/thrombolysis/pta Inc/shunt/img Left  Result Date: 10/24/2017 INDICATION: 52 year old male with end-stage renal disease on hemodialysis via a left upper extremity radiocephalic arteriovenous fistula. He has had the fistula worked on once before at [facility] and underwent an angioplasty. His fistula clotted last Thursday. He presents today for attempted declot procedure. EXAM: 1. Ultrasound-guided vascular access antegrade 2. Ultrasound-guided vascular access retrograde 3. Declot procedure 4. Angioplasty to 8 mm MEDICATIONS: 4 mg tPA, 5000 units heparin, 7 mg Versed, 200 mcg fentanyl ANESTHESIA/SEDATION: Moderate Sedation Time:  90 minutes The patient was continuously monitored during the procedure by the interventional radiology nurse under my direct supervision. FLUOROSCOPY TIME:  Fluoroscopy Time: 23 minutes 36 seconds (13 mGy). COMPLICATIONS: None immediate. PROCEDURE: Informed written consent was obtained from the patient after a thorough discussion of the procedural risks, benefits and alternatives. All questions were addressed. Maximal Sterile Barrier Technique was utilized including caps, mask,  sterile gowns, sterile gloves, sterile drape, hand hygiene and skin antiseptic. A timeout was performed prior to the initiation of the procedure. The left forearm fistula was interrogated with ultrasound and found to be thrombosed. There is echogenic calcified thrombus along the walls of the fistula. The arterial anastomosis remains patent. An image was obtained and stored for the medical record. Local anesthesia was attained by infiltration with 1% lidocaine. A small dermatotomy was made. Under real-time sonographic guidance, the vessel was punctured in antegrade fashion with a 21 gauge micropuncture needle. Using standard technique, the initial micro needle was exchanged over a 0.018 micro wire for a transitional 4 [place] micro sheath. 2 mg tPA were then administered into the thrombosed fistula. The micro sheath was then exchanged over the 0.035 wire for a 6 French vascular sheath. Using the same technique, a second retrograde access was obtained further up the forearm. Once the 4 [place] micro sheath was introduced into the fistula, an additional 2 mg tPA were administered. The 4 French micro sheath was then exchanged over a 0.035 wire for  a working 6 [place] vascular sheath. An angled catheter was advanced over a Glidewire into the left subclavian vein. Central venography was performed. The central veins are widely patent. A pull-back venogram was performed. No stenosis visible in the upper arm. The fistula becomes thrombosed at the level of the proximal forearm. The angled catheter was then introduced from the retrograde approach and successfully navigated into the right radial artery. An arteriogram was performed. Heavily calcified thrombus is present in the most proximal aspect of the draining cephalic vein resulting in an approximately 80% stenosis. This appears to be the flow limiting occlusion. Multiple techniques were used including an over the wire 5 [place] Fogarty balloon, as well as balloon maceration  and angioplasty first to 6 mm, and then to 8 mm using a 6 x 40 and then an 8 x 40 mustang balloon. The aero percutaneous thrombectomy device was also used in an effort to disrupt the chronic thrombus. Intermittently, both the antegrade and retrograde sheaths were aspirated and thrombus was removed. The 5 French catheter was reintroduced into the radial artery and an arteriogram was performed. There is now antegrade flow through the arteriovenous fistula. There is a region of focal approximately 50% stenosis in the proximal forearm. This was successfully angioplastied to 8 mm using the 8 x 40 mm Conquest balloon. Follow-up venography demonstrates no significant residual stenosis and no significant residual thrombus. Flow is brisk. There is a palpable thrill. There is a focal soft tissue hematoma at the retrograde sheath access site. Hemostasis was attained through a combination of manual pressure and a 2 0 nylon pursestring suture. The antegrade sheath was also removed. Hemostasis was attained with the nylon pursestring suture. IMPRESSION: 1. Calcified chronic thrombus and calcific wall thickening present in the proximal draining cephalic vein in the distal forearm. 2. Moderate focal stenosis of the cephalic vein in the proximal forearm. 3. Successful declot procedure. 4. Angioplasty of the calcific stenosis (80%) in the proximal cephalic vein at the distal forearm to 8 mm. 5. Angioplasty of the moderate stenosis (50%) of the draining cephalic vein in the proximal forearm to 8 mm. ACCESS: Barring early rethrombosis, this access remains amenable to future percutaneous interventions as clinically indicated. However, due to the degree of underlying vessel calcification, consideration should also be given to vascular surgery referral to discuss the possibility of new access site planning. Signed, Criselda Peaches, MD [facility] Radiology Electronically Signed   By:  Jacqulynn Cadet M.D.   On: 10/24/2017 12:28   Medications: . sodium chloride    . sodium chloride    . sodium chloride     . albuterol  10 mg Nebulization Once  . amLODipine  10 mg Oral QHS  . aspirin  81 mg Oral Daily  . Chlorhexidine Gluconate Cloth  6 each Topical Q0600  . cloNIDine  0.3 mg Oral TID  . dextrose  1 ampule Intravenous Once  . doxercalciferol  5 mcg Intravenous Q M,W,F-HD  . heparin  5,000 Units Subcutaneous Q8H  . insulin aspart  10 Units Intravenous Once  . losartan  100 mg Oral QHS  . multivitamin  1 tablet Oral QHS  . pantoprazole  20 mg Oral Daily  . sevelamer carbonate  1,600 mg Oral TID WC

## 2017-10-25 NOTE — Progress Notes (Signed)
Appreciate VVS seeing patient.  K 2 hrs post HD is down to 5.1 from 6.9 but I expect will be much higher in the am.  Cr down about 42% .Net UF 4.9 L with post wt 69.3.    We will schedule another HD first round to more and cannulate AVF again.  Hopefully, it will work better. Flows ony 250 today  Amalia Hailey, PA-C

## 2017-10-25 NOTE — Progress Notes (Signed)
Received critical K of 6.9. PA made aware and received order to change bath to 1K for the entire duration of HD tx.

## 2017-10-25 NOTE — Progress Notes (Signed)
Family Medicine Teaching Service Daily Progress Note Intern Pager: [phone]  Patient name: Jeremy Mccoy Medical record number: [id number] Date of birth: 12-Jun-1965 Age: 52 y.o. Gender: male  Primary Care Provider: Patient, No Pcp Per Consultants: Nephrology, Interventional Radiology Code Status: full   Pt Overview and Major Events to Date:  6/10 Admitted to [facility], received HD 6/11 Declot procedure with IR 6/12 Needs VVS eval, vein mapping study  Assessment and Plan: Jeremy Mccoy is a 52 y.o. male presenting with electrolyte abnormalities 2/2 a clogged LUE AV fistula.  Hyperkalemia Potassium 6.9 on recheck this am. Receiving HD this am. Appreciate nephrology input. S/P declot by IR on 6/11. Chronic issues with dialysis acces, mentioned below. - vital signs per telemetry routine - cardiac monitoring - nephrology following, appreciate their recs - heparin for dvt ppx - Dialysis this am - am renal function panel  ESRD HD this am. Patient with problems accessing LUE av fistula. Chronic calcifications. Will need vascular surgery eval to see if a new AV fistula is appropriate.  Appreciate nephrology input.  - nephrology following appreciate their recs - dialysis MWF - NPO until after procedure - With IR this am for LUE thrombectomy vs thrombolysis vs angiplasty - NPO at midnight  Anemia Likely 2/2 ESRD. hgb 9.9 on am cbc. Likely 2/2 esrd. Well above threshold for transfusion. EPO w/ dialysis. - daily cbc - epo with dialysis  Hypertension Well controlled on current regimen.   135/89 this am. Takes norvasc, coreg, clonidine, losartan. Will hold patient's coreg while inpatient due to cocaine abuse. Will hold this medication in discharge due to contraindication in cocaine abuse. - continue norvasc 10mg  - continue clonidine 0.3mg  tid - continue losartan 100mg  daily - holding coreg - hold coreg on discharge  HFpEF Echo from 2015 showing EF of 50-55%. Could likely  benefit from another echo but do not think this is useful as inpatient. Already on most medications indicated for HFrEF if he is shown to have that at some pain. Could add spiro if EF drops below 45%. - continue norvasc, clonidine, losartan - holding coreg - consider repeat echo as outpatient  Cocaine Abuse Will hold home coreg in setting of cocaine abuse. 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 Will need counseling as outpatient. Has been using 28 years.  FEN/GI: NPO PPx: heparin  Disposition: home  Subjective:  Doing well this morning. Would like to be able to leave if possible.  Objective: Temp:  [97.6 F (36.4 C)-98.6 F (37 C)] 98.2 F (36.8 C) (06/12 1138) Pulse Rate:  [66-79] 79 (06/12 1138) Resp:  [15-18] 18 (06/12 1138) BP: (128-148)/(74-108) 135/89 (06/12 1138) SpO2:  [98 %-100 %] 98 % (06/12 1138) Weight:  [152 lb 12.5 oz (69.3 kg)-163 lb 9.3 oz (74.2 kg)] 152 lb 12.5 oz (69.3 kg) (06/12 1138) Physical Exam: General: thin, caucasian male, resting comfortably in dialysis bed. NAD Cardiovascular: rrr, no m/r/g. LUE radiocephalic fistula without thrill, arterial flow noted in distal distribution of radial artery. R IJ HD cath in place. Respiratory: lungs clear to ausculation bilaterally, no increased work of breathing Gastrointestinal: soft, NT, ND MSK: 5/5 strength BUE, BLE.  Psych: appropriate  Laboratory: Recent Labs  Lab 10/23/17 1624 10/24/17 0753 10/25/17 0752  WBC 4.5 4.4 4.4  HGB 11.2* 11.1* 9.9*  HCT 35.6* 35.8* 30.6*  PLT 158 152 124*   Recent Labs  Lab 10/24/17 0753 10/25/17 0558 10/25/17 0627  NA 139 133* 133*  K 5.6* 6.9* 6.9*  CL 101  98* 97*  CO2 26 23 22   BUN 48* 69* 69*  CREATININE 10.57* 12.83* 12.88*  CALCIUM 9.2 8.8* 8.9  GLUCOSE 89 87 86    Imaging/Diagnostic Tests: none  Guadalupe Dawn, MD 10/25/2017, 1:58 PM PGY-1, [place] Intern pager: [phone], text pages welcome

## 2017-10-25 NOTE — Consult Note (Addendum)
Hospital Consult    Reason for Consult:  Dialysis access Requesting Physician:  Reinaldo Meeker [facility] MRN #:  [id number]  History of Present Illness: This is a 52 y.o. male who was seen by [facility] in the past and had undergone a left RC AVF on 11/29/11 by Dr. Scot Dock.  He states the fistula has worked well and he has only had one balloon intervention at [facility] a couple of years ago.  He states that they checked his fistula last Wednesday at HD and it was working fine.  On Thursday, he had some pain over the fistula and and felt something wasn't right.  At dialysis on Friday, he was sent to the hospital for intervention but was found to have K+ > 7.  A temp cath was placed and he was dialyzed and he was sent home.  He subsequently returned underwent a fistulogram in IR on 10/24/17.  He states that he had to undergo dialysis prior to his procedure.  He states that he went to dialysis today and they tried to stick his fistula and the guy couldn't get it and someone else stuck him and got it.  He states the machine ran but it kept alarming.  [facility]VS is consulted.  He states that he dialyzes M/W/F on [employer].    He takes a daily aspirin.  She is on CCB, ARB for blood pressure control.    Past Medical History:  Diagnosis Date  . Anemia   . Anxiety   . Arthritis    "qwhere" (05/15/2013)  . CHF (congestive heart failure) ([place])   . COPD (chronic obstructive pulmonary disease) ([place])   . Crack cocaine use   . Dental caries   . Depression   . End stage renal disease ([place]) 01/11/2012   Patient presented 11/04/11 to hospital with CP, wt loss and N/V. Creat was 10, admitted. 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 ANA was + 1:80, +MPO and +pr3 Ab's (ANCA). Received plasmapheresis x 7, IV cytoxan and pred taper. First HD was 11/17/11. 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 Pt d/c'd to do  outpt HD and may have gotten 1-2 additional Cytoxan as OP, but this was stopped eventually since renal function didn't recover. Now gets HD TTS schedule at [facility].  L forearm AVF 11/19/11 by Dr. Scot Dock is current access.    Marland Kitchen ESRD (end stage renal disease) on dialysis [facility])    TTS: [address] [place] (05/15/2013)  . ESRD (end stage renal disease) on dialysis ([place])   . Headache(784.0)    "get it from going to dialysis; when they get real bad I come to hospital" (05/15/2013)  . Headache(784.0)   . Hematemesis/vomiting blood   . History of blood transfusion 10/2011; 04/2013  . HTN (hypertension) 06/12/2012  . Renal insufficiency   . Seizures ([place])   . Shortness of breath    "just when I have too much potassium is too high" (05/15/2013)    Past Surgical History:  Procedure Laterality Date  . AV FISTULA PLACEMENT  11/29/2011   Procedure: ARTERIOVENOUS (AV) FISTULA CREATION;  Surgeon: Angelia Mould, MD;  Location: [facility] OR;  Service: Vascular;  Laterality: Left;  . ESOPHAGOGASTRODUODENOSCOPY N/A 05/17/2013   Procedure: ESOPHAGOGASTRODUODENOSCOPY (EGD);  Surgeon: Beryle Beams, MD;  Location[facility] ENDOSCOPY;  Service: Endoscopy;  Laterality: N/A;  . INGUINAL HERNIA REPAIR Bilateral 1967  .  IR FLUORO GUIDE CV LINE RIGHT  10/20/2017  . IR THROMBECTOMY AV FISTULA W/THROMBOLYSIS/PTA INC/SHUNT/IMG LEFT Left 10/24/2017  . IR US GUIDE VASC ACCESS LEFT  10/24/2017  . IR US GUIDE VASC ACCESS RIGHT  10/20/2017  . RENAL BIOPSY  11/07/2011    No Known Allergies  Prior to Admission medications   Medication Sig Start Date End Date Taking? Authorizing Provider  acetaminophen (TYLENOL) 500 MG tablet Take 1 tablet (500 mg total) by mouth every 8 (eight) hours as needed for mild pain or fever. 6 times a day, 500 mg, 12 to 24 tabs a day, 6,000 to 12,000 mg daily for pain Patient taking differently: Take 500 mg by mouth every 8 (eight) hours as needed for mild pain or fever.  08/05/16  Yes Molt, Bethany, DO    amLODipine (NORVASC) 10 MG tablet Take 1 tablet (10 mg total) by mouth daily. 01/25/14  Yes McLean-Scocuzza, Nino Glow, MD  aspirin 81 MG chewable tablet Chew 1 tablet (81 mg total) by mouth daily. 01/25/14  Yes McLean-Scocuzza, Nino Glow, MD  carvedilol (COREG) 25 MG tablet Take 1.5 tablets (37.5 mg total) by mouth 2 (two) times daily with a meal. On Monday-Wednesday-Friday-Sunday (non-HD days); on dialysis days (Tuesday-Thursday-Saturday) take it once a day (evening time only). Patient taking differently: Take 25 mg by mouth 2 (two) times daily with a meal.  01/30/14  Yes Croitoru, Mihai, MD  cloNIDine (CATAPRES) 0.3 MG tablet Take 0.3 mg by mouth 3 (three) times daily.    Yes [provider]  hydrOXYzine (ATARAX/VISTARIL) 25 MG tablet Take 25 mg by mouth 3 (three) times daily as needed for itching.  06/10/15  Yes [provider]  losartan (COZAAR) 100 MG tablet Take 100 mg by mouth at bedtime. 08/02/16  Yes [provider]  multivitamin (RENA-VIT) TABS tablet Take 1 tablet by mouth at bedtime. 11/29/11  Yes Alric Seton, PA-C  Nutritional Supplements (FEEDING SUPPLEMENT, NEPRO CARB STEADY,) LIQD Take 237 mLs by mouth 3 (three) times daily as needed (Supplement). 05/17/13  Yes Mikhail, Maryann, DO  pantoprazole (PROTONIX) 20 MG tablet Take 20 mg by mouth daily. 02/21/16  Yes [provider]  polyethylene glycol (MIRALAX / GLYCOLAX) packet Take 17 g by mouth daily as needed for mild constipation. 08/05/16  Yes Molt, Bethany, DO  ranitidine (ZANTAC) 300 MG tablet Take 300 mg by mouth daily as needed for heartburn.   Yes [provider]  sevelamer carbonate (RENVELA) 800 MG tablet Take 2 tablets (1,600 mg total) by mouth 3 (three) times daily with meals. Patient taking differently: Take 2,400 mg by mouth 3 (three) times daily with meals. And with snacks 01/25/14  Yes McLean-Scocuzza, Nino Glow, MD    Social History   Socioeconomic History  . Marital status: Single     Spouse name: Not on file  . Number of children: 0  . Years of education: 9 th  . Highest education level: Not on file  Occupational History  . Occupation: Corporate treasurer    Comment: Disabled  Social Needs  . Financial resource strain: Not on file  . Food insecurity:    Worry: Not on file    Inability: Not on file  . Transportation needs:    Medical: Not on file    Non-medical: Not on file  Tobacco Use  . Smoking status: Current Every Day Smoker    Packs/day: 1.00    Years: 35.00    Pack years: 35.00    Types: Cigars, Cigarettes  .  Smokeless tobacco: Never Used  Substance and Sexual Activity  . Alcohol use: No    Comment: 05/15/2013 "aien't drank in ~ 25 yrs"  . Drug use: Yes    Types: Marijuana, Cocaine    Comment: Precription drugs (e.g. percocet) "I take what I have to to controll my constant pain" (05/15/2013)  . Sexual activity: Never  Lifestyle  . Physical activity:    Days per week: Not on file    Minutes per session: Not on file  . Stress: Not on file  Relationships  . Social connections:    Talks on phone: Not on file    Gets together: Not on file    Attends religious service: Not on file    Active member of club or organization: Not on file    Attends meetings of clubs or organizations: Not on file    Relationship status: Not on file  . Intimate partner violence:    Fear of current or ex partner: Not on file    Emotionally abused: Not on file    Physically abused: Not on file    Forced sexual activity: Not on file  Other Topics Concern  . Not on file  Social History Narrative   ** Merged History Encounter **   Patient lives at home with his parents and he is disabled.   Education 9th grade education   Right handed   Caffeine one cup daily         Family History  Problem Relation Age of Onset  . Heart attack Father     ROS: [x]  Positive   [ ]  Negative   [ ]  All sytems reviewed and are negative  Cardiac: []  chest pain/pressure []   palpitations [x]  SOB  []  DOE  Vascular: []  pain in legs while walking []  pain in legs at rest []  pain in legs at night []  non-healing ulcers []  hx of DVT []  swelling in legs  Pulmonary: []  productive cough [x]  asthma/wheezing/COPD []  home O2  Neurologic: []  weakness in []  arms []  legs []  numbness in []  arms []  legs []  hx of CVA []  mini stroke [] difficulty speaking or slurred speech []  temporary loss of vision in one eye []  dizziness [x]  hx of a seizure and HA after dialysis tx   Hematologic: []  hx of cancer []  bleeding problems []  problems with blood clotting easily [x]  anemia  Endocrine:   []  diabetes []  thyroid disease  GI []  vomiting blood []  blood in stool  GU: [x]  CKD/renal failure [x]  HD--[x]  M/W/F or []  T/T/S; ? clotted graft []  burning with urination []  blood in urine  Psychiatric: [x]  anxiety []  depression  Musculoskeletal: [x]  arthritis []  joint pain  Integumentary: []  rashes []  ulcers  Constitutional: []  fever []  chills   Physical Examination  Vitals:   10/25/17 0930 10/25/17 1138  BP: 134/74 135/89  Pulse: 74 79  Resp: 18 18  Temp:  98.2 F (36.8 C)  SpO2:  98%   Body mass index is 20.72 kg/m.  General:  WDWN in NAD Gait: Not observed HENT: WNL, normocephalic Pulmonary: normal non-labored breathing, without Rales, rhonchi,  wheezing Cardiac: regular, without  Murmurs, rubs or gallops; without carotid bruits Abdomen:  soft, NT/ND, no masses Skin: without rashes Vascular Exam/Pulses:  Right Left  Radial 2+ (normal) 2+ (normal)  Ulnar 1+ (weak) 1+ (weak)  DP 2+ (normal) 2+ (normal)  PT 2+ (normal) 2+ (normal)   Extremities: without ischemic changes, without Gangrene , without cellulitis; without open  wounds; +thrill within the left radial cephalic AVF up through the basilic vein. Musculoskeletal: no muscle wasting or atrophy  Neurologic: A&O X 3;  No focal weakness or paresthesias are detected; speech is  fluent/normal Psychiatric:  The pt has Normal affect.   CBC    Component Value Date/Time   WBC 4.4 10/25/2017 0752   RBC 3.05 (L) 10/25/2017 0752   HGB 9.9 (L) 10/25/2017 0752   HCT 30.6 (L) 10/25/2017 0752   PLT 124 (L) 10/25/2017 0752   MCV 100.3 (H) 10/25/2017 0752   MCH 32.5 10/25/2017 0752   MCHC 32.4 10/25/2017 0752   RDW 14.3 10/25/2017 0752   LYMPHSABS 1.0 10/24/2017 0753   MONOABS 0.5 10/24/2017 0753   EOSABS 0.0 10/24/2017 0753   BASOSABS 0.0 10/24/2017 0753    BMET    Component Value Date/Time   NA 133 (L) 10/25/2017 0627   NA 142 09/12/2017 1102   K 6.9 (HH) 10/25/2017 0627   CL 97 (L) 10/25/2017 0627   CO2 22 10/25/2017 0627   GLUCOSE 86 10/25/2017 0627   BUN 69 (H) 10/25/2017 0627   BUN 47 (H) 09/12/2017 1102   CREATININE 12.88 (H) 10/25/2017 0627   CALCIUM 8.9 10/25/2017 0627   GFRNONAA 4 (L) 10/25/2017 0627   GFRAA 4 (L) 10/25/2017 0627    COAGS: Lab Results  Component Value Date   INR 1.11 10/24/2017   INR 1.07 10/23/2017   INR 1.26 08/02/2016     Invasive Vascular Imaging:   Fistulogram 10/24/17: IMPRESSION: 1. Calcified chronic thrombus and calcific wall thickening present in the proximal draining cephalic vein in the distal forearm. 2. Moderate focal stenosis of the cephalic vein in the proximal forearm. 3. Successful declot procedure. 4. Angioplasty of the calcific stenosis (80%) in the proximal cephalic vein at the distal forearm to 8 mm. 5. Angioplasty of the moderate stenosis (50%) of the draining cephalic vein in the proximal forearm to 8 mm.  ACCESS: Barring early rethrombosis, this access remains amenable to future percutaneous interventions as clinically indicated. However, due to the degree of underlying vessel calcification, consideration should also be given to vascular surgery referral to discuss the possibility of new access site planning.  Statin:  No. Beta Blocker:  Yes.   Aspirin:  Yes.   ACEI:  No. ARB:  Yes.    CCB use:  Yes Other antiplatelets/anticoagulants:  Yes.   SQ heparin (DVT prophylaxis)   ASSESSMENT/PLAN: This is a 52 y.o. male with ESRD s/p fistulogram by IR and malfunctioning fistula   -staff was able to stick the fistula and run dialysis but with poor flow rates.  The fistula has an excellent thrill and Dr. Trula Slade would prefer that we try to use the fistula again at his next dialysis treatment.  If he has continued difficulty, we may need to convert his fistula to an upper arm fistula but not ready to give up on this fistula quite yet.     Leontine Locket, PA-C [facility] [phone]   I agree with the above.  I have seen and evaluated the patient.  He has had difficulty with dialysis access.  He recently underwent successful endovascular thrombolyzes of his left radiocephalic fistula.  Currently, there is a good thrill within his fistula.  I have recommended repeating attempts of cannulation of his fistula tomorrow.  If unsuccessful, he would probably benefit from a repeat fistulogram given the good thrill within his fistula prior to contemplating a new access.  Based  on review of his fistulogram I do not think a upper arm cephalic vein fistula would be recommended but consideration for basilic vein fistula if needed.  We will follow-up following his next dialysis session  Wells Brabham

## 2017-10-26 LAB — CBC WITH DIFFERENTIAL/PLATELET
ABS IMMATURE GRANULOCYTES: 0 10*3/uL (ref 0.0–0.1)
BASOS ABS: 0 10*3/uL (ref 0.0–0.1)
BASOS PCT: 1 %
Eosinophils Absolute: 0.1 10*3/uL (ref 0.0–0.7)
Eosinophils Relative: 1 %
HCT: 34.8 % — ABNORMAL LOW (ref 39.0–52.0)
HEMOGLOBIN: 10.9 g/dL — AB (ref 13.0–17.0)
Immature Granulocytes: 1 %
Lymphocytes Relative: 25 %
Lymphs Abs: 1.1 10*3/uL (ref 0.7–4.0)
MCH: 31.7 pg (ref 26.0–34.0)
MCHC: 31.3 g/dL (ref 30.0–36.0)
MCV: 101.2 fL — ABNORMAL HIGH (ref 78.0–100.0)
MONO ABS: 0.5 10*3/uL (ref 0.1–1.0)
Monocytes Relative: 12 %
NEUTROS ABS: 2.7 10*3/uL (ref 1.7–7.7)
Neutrophils Relative %: 60 %
PLATELETS: 132 10*3/uL — AB (ref 150–400)
RBC: 3.44 MIL/uL — AB (ref 4.22–5.81)
RDW: 14.1 % (ref 11.5–15.5)
WBC: 4.4 10*3/uL (ref 4.0–10.5)

## 2017-10-26 LAB — RENAL FUNCTION PANEL
ALBUMIN: 3.7 g/dL (ref 3.5–5.0)
Anion gap: 16 — ABNORMAL HIGH (ref 5–15)
BUN: 61 mg/dL — ABNORMAL HIGH (ref 6–20)
CALCIUM: 9.4 mg/dL (ref 8.9–10.3)
CO2: 23 mmol/L (ref 22–32)
Chloride: 97 mmol/L — ABNORMAL LOW (ref 101–111)
Creatinine, Ser: 11.66 mg/dL — ABNORMAL HIGH (ref 0.61–1.24)
GFR calc Af Amer: 5 mL/min — ABNORMAL LOW (ref >60)
GFR, EST NON AFRICAN AMERICAN: 4 mL/min — AB (ref >60)
Glucose, Bld: 96 mg/dL (ref 65–99)
PHOSPHORUS: 7.3 mg/dL — AB (ref 2.5–4.6)
Potassium: 6.1 mmol/L — ABNORMAL HIGH (ref 3.5–5.1)
SODIUM: 136 mmol/L (ref 135–145)

## 2017-10-26 NOTE — Progress Notes (Signed)
Hemodialysis: HD initiated via L AVF using 15g needles x2 without issue. AVF still tender with swelling/bruising. No difficulty cannulating however. Standing weight 70.5kg. 2k bath. All vitals stable. Patient has no complaints. Continue to monitor.

## 2017-10-26 NOTE — Progress Notes (Addendum)
[facility] Progress Note   Dialysis Orders: MWF 4.25 hr 500/800 EDW 69 2 K 2.25 Ca left lowr AVF hectorol 5 Parsabiv 12.5 venofer 50/week mircera 100 q 2 weeks  Assessment/Plan: 1. Hyperkalemia- K back up to 6.1 ;  2. ESRD - MWF - extra tmt today to ascertain AVF function - and also get K down; promises to go to his uusal outpt HD tomorrow Qb 400 3. Anemia - hgb 10.9  4. Secondary hyperparathyroidism - ^ P ^ renvela to 3 ac 5. HTN/volume - net U 5.2 Monday - goal for EDW today. Notoriously signs off early at about 2.5 hr at the outpt unit. 6. Nutrition - alb 3.5 - large IDWG 7. Clotted AVF - s/p declot 6/11 by IR ; marginal function today - PTA of calcific 80% stenosis of prox draining ceph vein and 50% mod stenosis of draining ceph vein in prox forearm. IR rec further access planning due to vessel calcification. Seen by VVS yesterday - felt ok to use - working fine today w bfr 400.  Repeat AF tomorrow at his HD unit. 8. Disp - ok for d/c today after temp cath removed  Myriam Jacobson, PA-C [place] [phone] 10/26/2017,9:41 AM  LOS: 1 day   Pt seen, examined and agree w A/P as above.  Kelly Splinter MD [facility] pager [phone]   10/26/2017, 1:05 PM    Subjective:   No c/o  Objective Vitals:   10/26/17 0800 10/26/17 0830 10/26/17 0900 10/26/17 0930  BP: (!) 135/92 107/62 (!) 155/92 (!) 142/91  Pulse: 76 79 81 79  Resp: (!) 25 17  16   Temp:      TempSrc:      SpO2:      Weight:      Height:       Physical Exam General: NAD  Heart: RRR Lungs: grossly clear Abdomen: soft NT Extremities: no sig edema Dialysis Access: left lower AVF mild edema of forearm and min edema of hand Qb 400 - have increased to 500 tolerating higher flow ok   Additional Objective Labs: Basic Metabolic Panel: Recent Labs  Lab 10/24/17 0753 10/25/17 0558 10/25/17 0627 10/25/17 1330 10/26/17 0508  NA 139 133* 133* 135 136  K 5.6*  6.9* 6.9* 5.1 6.1*  CL 101 98* 97* 93* 97*  CO2 26 23 22 29 23   GLUCOSE 89 87 86 76 96  BUN 48* 69* 69* 40* 61*  CREATININE 10.57* 12.83* 12.88* 8.89* 11.66*  CALCIUM 9.2 8.8* 8.9 10.1 9.4  PHOS 6.5* 7.0*  --   --  7.3*   Liver Function Tests: Recent Labs  Lab 10/24/17 0753 10/25/17 0558 10/26/17 0508  ALBUMIN 3.7 3.5 3.7   No results for input(s): LIPASE, AMYLASE in the last 168 hours. CBC: Recent Labs  Lab 10/20/17 1410 10/23/17 1027 10/23/17 1624 10/24/17 0753 10/25/17 0752 10/26/17 0508  WBC 3.9* 4.8 4.5 4.4 4.4 4.4  NEUTROABS 2.6  --   --  2.8  --  2.7  HGB 10.9* 10.7* 11.2* 11.1* 9.9* 10.9*  HCT 34.7* 33.9* 35.6* 35.8* 30.6* 34.8*  MCV 101.8* 101.8* 101.7* 102.9* 100.3* 101.2*  PLT 158 147* 158 152 124* 132*   Blood Culture    Component Value Date/Time   SDES BLOOD RIGHT ARM 02/24/2016 0950   SPECREQUEST BOTTLES DRAWN AEROBIC AND ANAEROBIC 5CC EACH 02/24/2016 0950   CULT NO GROWTH 5 DAYS 02/24/2016 0950   REPTSTATUS 02/29/2016 FINAL 02/24/2016 0950  Cardiac Enzymes: No results for input(s): CKTOTAL, CKMB, CKMBINDEX, TROPONINI in the last 168 hours. CBG: No results for input(s): GLUCAP in the last 168 hours. Iron Studies: No results for input(s): IRON, TIBC, TRANSFERRIN, FERRITIN in the last 72 hours. Lab Results  Component Value Date   INR 1.11 10/24/2017   INR 1.07 10/23/2017   INR 1.26 08/02/2016   Studies/Results: Ir US Guide Vasc Access Left  Result Date: 10/24/2017 INDICATION: 52 year old male with end-stage renal disease on hemodialysis via a left upper extremity radiocephalic arteriovenous fistula. He has had the fistula worked on once before at [facility] and underwent an angioplasty. His fistula clotted last Thursday. He presents today for attempted declot procedure. EXAM: 1. Ultrasound-guided vascular access antegrade 2. Ultrasound-guided vascular access retrograde 3. Declot procedure 4. Angioplasty to 8 mm MEDICATIONS: 4 mg tPA, 5000 units  heparin, 7 mg Versed, 200 mcg fentanyl ANESTHESIA/SEDATION: Moderate Sedation Time:  90 minutes The patient was continuously monitored during the procedure by the interventional radiology nurse under my direct supervision. FLUOROSCOPY TIME:  Fluoroscopy Time: 23 minutes 36 seconds (13 mGy). COMPLICATIONS: None immediate. PROCEDURE: Informed written consent was obtained from the patient after a thorough discussion of the procedural risks, benefits and alternatives. All questions were addressed. Maximal Sterile Barrier Technique was utilized including caps, mask, sterile gowns, sterile gloves, sterile drape, hand hygiene and skin antiseptic. A timeout was performed prior to the initiation of the procedure. The left forearm fistula was interrogated with ultrasound and found to be thrombosed. There is echogenic calcified thrombus along the walls of the fistula. The arterial anastomosis remains patent. An image was obtained and stored for the medical record. Local anesthesia was attained by infiltration with 1% lidocaine. A small dermatotomy was made. Under real-time sonographic guidance, the vessel was punctured in antegrade fashion with a 21 gauge micropuncture needle. Using standard technique, the initial micro needle was exchanged over a 0.018 micro wire for a transitional 4 [place] micro sheath. 2 mg tPA were then administered into the thrombosed fistula. The micro sheath was then exchanged over the 0.035 wire for a 6 French vascular sheath. Using the same technique, a second retrograde access was obtained further up the forearm. Once the 4 [place] micro sheath was introduced into the fistula, an additional 2 mg tPA were administered. The 4 French micro sheath was then exchanged over a 0.035 wire for a working 6 [place] vascular sheath. An angled catheter was advanced over a Glidewire into the left subclavian vein. Central venography was performed. The central veins are widely patent. A pull-back venogram was  performed. No stenosis visible in the upper arm. The fistula becomes thrombosed at the level of the proximal forearm. The angled catheter was then introduced from the retrograde approach and successfully navigated into the right radial artery. An arteriogram was performed. Heavily calcified thrombus is present in the most proximal aspect of the draining cephalic vein resulting in an approximately 80% stenosis. This appears to be the flow limiting occlusion. Multiple techniques were used including an over the wire 5 [place] Fogarty balloon, as well as balloon maceration and angioplasty first to 6 mm, and then to 8 mm using a 6 x 40 and then an 8 x 40 mustang balloon. The aero percutaneous thrombectomy device was also used in an effort to disrupt the chronic thrombus. Intermittently, both the antegrade and retrograde sheaths were aspirated and thrombus was removed. The 5 French catheter was reintroduced into the radial artery and an arteriogram was performed. There is  now antegrade flow through the arteriovenous fistula. There is a region of focal approximately 50% stenosis in the proximal forearm. This was successfully angioplastied to 8 mm using the 8 x 40 mm Conquest balloon. Follow-up venography demonstrates no significant residual stenosis and no significant residual thrombus. Flow is brisk. There is a palpable thrill. There is a focal soft tissue hematoma at the retrograde sheath access site. Hemostasis was attained through a combination of manual pressure and a 2 0 nylon pursestring suture. The antegrade sheath was also removed. Hemostasis was attained with the nylon pursestring suture. IMPRESSION: 1. Calcified chronic thrombus and calcific wall thickening present in the proximal draining cephalic vein in the distal forearm. 2. Moderate focal stenosis of the cephalic vein in the proximal forearm. 3. Successful declot procedure. 4. Angioplasty of the calcific stenosis (80%) in the proximal cephalic vein at the  distal forearm to 8 mm. 5. Angioplasty of the moderate stenosis (50%) of the draining cephalic vein in the proximal forearm to 8 mm. ACCESS: Barring early rethrombosis, this access remains amenable to future percutaneous interventions as clinically indicated. However, due to the degree of underlying vessel calcification, consideration should also be given to vascular surgery referral to discuss the possibility of new access site planning. Signed, Criselda Peaches, MD [facility] Radiology Electronically Signed   By: Jacqulynn Cadet M.D.   On: 10/24/2017 12:28   Ir Thrombectomy Av Fistula W/thrombolysis/pta Inc/shunt/img Left  Result Date: 10/24/2017 INDICATION: 52 year old male with end-stage renal disease on hemodialysis via a left upper extremity radiocephalic arteriovenous fistula. He has had the fistula worked on once before at [facility] and underwent an angioplasty. His fistula clotted last Thursday. He presents today for attempted declot procedure. EXAM: 1. Ultrasound-guided vascular access antegrade 2. Ultrasound-guided vascular access retrograde 3. Declot procedure 4. Angioplasty to 8 mm MEDICATIONS: 4 mg tPA, 5000 units heparin, 7 mg Versed, 200 mcg fentanyl ANESTHESIA/SEDATION: Moderate Sedation Time:  90 minutes The patient was continuously monitored during the procedure by the interventional radiology nurse under my direct supervision. FLUOROSCOPY TIME:  Fluoroscopy Time: 23 minutes 36 seconds (13 mGy). COMPLICATIONS: None immediate. PROCEDURE: Informed written consent was obtained from the patient after a thorough discussion of the procedural risks, benefits and alternatives. All questions were addressed. Maximal Sterile Barrier Technique was utilized including caps, mask, sterile gowns, sterile gloves, sterile drape, hand hygiene and skin antiseptic. A timeout was performed prior to the initiation of the procedure. The left forearm fistula was  interrogated with ultrasound and found to be thrombosed. There is echogenic calcified thrombus along the walls of the fistula. The arterial anastomosis remains patent. An image was obtained and stored for the medical record. Local anesthesia was attained by infiltration with 1% lidocaine. A small dermatotomy was made. Under real-time sonographic guidance, the vessel was punctured in antegrade fashion with a 21 gauge micropuncture needle. Using standard technique, the initial micro needle was exchanged over a 0.018 micro wire for a transitional 4 [place] micro sheath. 2 mg tPA were then administered into the thrombosed fistula. The micro sheath was then exchanged over the 0.035 wire for a 6 French vascular sheath. Using the same technique, a second retrograde access was obtained further up the forearm. Once the 4 [place] micro sheath was introduced into the fistula, an additional 2 mg tPA were administered. The 4 French micro sheath was then exchanged over a 0.035 wire for a working 6 [place] vascular sheath. An angled catheter was advanced over a  Glidewire into the left subclavian vein. Central venography was performed. The central veins are widely patent. A pull-back venogram was performed. No stenosis visible in the upper arm. The fistula becomes thrombosed at the level of the proximal forearm. The angled catheter was then introduced from the retrograde approach and successfully navigated into the right radial artery. An arteriogram was performed. Heavily calcified thrombus is present in the most proximal aspect of the draining cephalic vein resulting in an approximately 80% stenosis. This appears to be the flow limiting occlusion. Multiple techniques were used including an over the wire 5 [place] Fogarty balloon, as well as balloon maceration and angioplasty first to 6 mm, and then to 8 mm using a 6 x 40 and then an 8 x 40 mustang balloon. The aero percutaneous thrombectomy device was also used in an effort to  disrupt the chronic thrombus. Intermittently, both the antegrade and retrograde sheaths were aspirated and thrombus was removed. The 5 French catheter was reintroduced into the radial artery and an arteriogram was performed. There is now antegrade flow through the arteriovenous fistula. There is a region of focal approximately 50% stenosis in the proximal forearm. This was successfully angioplastied to 8 mm using the 8 x 40 mm Conquest balloon. Follow-up venography demonstrates no significant residual stenosis and no significant residual thrombus. Flow is brisk. There is a palpable thrill. There is a focal soft tissue hematoma at the retrograde sheath access site. Hemostasis was attained through a combination of manual pressure and a 2 0 nylon pursestring suture. The antegrade sheath was also removed. Hemostasis was attained with the nylon pursestring suture. IMPRESSION: 1. Calcified chronic thrombus and calcific wall thickening present in the proximal draining cephalic vein in the distal forearm. 2. Moderate focal stenosis of the cephalic vein in the proximal forearm. 3. Successful declot procedure. 4. Angioplasty of the calcific stenosis (80%) in the proximal cephalic vein at the distal forearm to 8 mm. 5. Angioplasty of the moderate stenosis (50%) of the draining cephalic vein in the proximal forearm to 8 mm. ACCESS: Barring early rethrombosis, this access remains amenable to future percutaneous interventions as clinically indicated. However, due to the degree of underlying vessel calcification, consideration should also be given to vascular surgery referral to discuss the possibility of new access site planning. Signed, Criselda Peaches, MD [facility] Radiology Electronically Signed   By: Jacqulynn Cadet M.D.   On: 10/24/2017 12:28   Medications: . sodium chloride    . sodium chloride    . sodium chloride    . sodium chloride    . sodium chloride     . sodium chloride    . sodium chloride     . albuterol  10 mg Nebulization Once  . amLODipine  10 mg Oral QHS  . aspirin  81 mg Oral Daily  . Chlorhexidine Gluconate Cloth  6 each Topical Q0600  . Chlorhexidine Gluconate Cloth  6 each Topical Q0600  . cloNIDine  0.3 mg Oral TID  . dextrose  1 ampule Intravenous Once  . doxercalciferol  5 mcg Intravenous Q M,W,F-HD  . heparin  5,000 Units Subcutaneous Q8H  . insulin aspart  10 Units Intravenous Once  . losartan  100 mg Oral QHS  . multivitamin  1 tablet Oral QHS  . pantoprazole  20 mg Oral Daily  . sevelamer carbonate  1,600 mg Oral TID WC

## 2017-10-26 NOTE — Progress Notes (Signed)
Patient discharged to home. AVS reviewed including medications. IV removed, tele-box returned. Patient left floor ambulatory, refused wheelchair and staff assistance.

## 2017-10-26 NOTE — Discharge Summary (Signed)
[facility] Discharge Summary  Patient name: Jeremy Mccoy Medical record number: [id number] Date of birth: 12-Mar-1966 Age: 52 y.o. Gender: male Date of Admission: 10/23/2017  Date of Discharge: 10/26/2017 Admitting Physician: Guadalupe Dawn, MD  Primary Care Provider: Patient, No Pcp Per Consultants: VVS, IR, nephrology  Indication for Hospitalization: hyperkalemia  Discharge Diagnoses/Problem List:  Hyperkalemia esrd Anemia Hypertension hfpef Cocaine abuse  Disposition: home  Discharge Condition: stable  Discharge Exam: General:thin, caucasian male, resting comfortably in dialysis bed. NAD Cardiovascular:rrr, no m/r/g. LUE radiocephalic fistula without thrill, arterial flow noted in distal distribution of radial artery. R IJ HD cath in place. Respiratory:lungs clear to ausculation bilaterally, no increased work of breathing Gastrointestinal:soft, NT, ND MSK:5/5 strength BUE, BLE. Psych:appropriate  Brief Hospital Course:  52 year old who presented on 6/10 with potassium of 7.0. Patient had clotted av fistula on 6/8. He was due to have IR declot procedure but was found to have k of 7.0. He had a temporary HD cath placed and underwent course of dialysis. Had av fistula declot on 6/11. Fistula did not draw well at HD on 6/12 and patient was evaluated by VVS. They felt the fistula was still working well enough and recommended another session. Patient underwent dialysis on 6/13 and the session went well. His HD cath was removed and he was discharged home with close follow up.  Patient is chronic cocaine user. He had no interest in stopping. Coreg was held on discharge. Issues for Follow Up:  1. Follow up with vascular surgery regarding longer term access for dialysis  Significant Procedures: IR LUE av fistula angioplasty  Significant Labs and Imaging:  Recent Labs  Lab 10/24/17 0753 10/25/17 0752 10/26/17 0508  WBC 4.4 4.4 4.4  HGB  11.1* 9.9* 10.9*  HCT 35.8* 30.6* 34.8*  PLT 152 124* 132*   Recent Labs  Lab 10/23/17 1623 10/24/17 0753 10/25/17 0558 10/25/17 0627 10/25/17 1330 10/26/17 0508  NA 138 139 133* 133* 135 136  K 4.5 5.6* 6.9* 6.9* 5.1 6.1*  CL 98* 101 98* 97* 93* 97*  CO2 28 26 23 22 29 23   GLUCOSE 135* 89 87 86 76 96  BUN 43* 48* 69* 69* 40* 61*  CREATININE 9.55* 10.57* 12.83* 12.88* 8.89* 11.66*  CALCIUM 8.9 9.2 8.8* 8.9 10.1 9.4  PHOS 3.0 6.5* 7.0*  --   --  7.3*  ALBUMIN 4.0 3.7 3.5  --   --  3.7      Results/Tests Pending at Time of Discharge:  Discharge Medications:  Allergies as of 10/26/2017   No Known Allergies     Medication List    STOP taking these medications   carvedilol 25 MG tablet Commonly known as:  COREG     TAKE these medications   acetaminophen 500 MG tablet Commonly known as:  TYLENOL Take 1 tablet (500 mg total) by mouth every 8 (eight) hours as needed for mild pain or fever. 6 times a day, 500 mg, 12 to 24 tabs a day, 6,000 to 12,000 mg daily for pain What changed:  additional instructions   amLODipine 10 MG tablet Commonly known as:  NORVASC Take 1 tablet (10 mg total) by mouth daily.   aspirin 81 MG chewable tablet Chew 1 tablet (81 mg total) by mouth daily.   cloNIDine 0.3 MG tablet Commonly known as:  CATAPRES Take 0.3 mg by mouth 3 (three) times daily.   feeding supplement (NEPRO CARB STEADY) Liqd Take 237 mLs by  mouth 3 (three) times daily as needed (Supplement).   hydrOXYzine 25 MG tablet Commonly known as:  ATARAX/VISTARIL Take 25 mg by mouth 3 (three) times daily as needed for itching.   losartan 100 MG tablet Commonly known as:  COZAAR Take 100 mg by mouth at bedtime.   multivitamin Tabs tablet Take 1 tablet by mouth at bedtime.   pantoprazole 20 MG tablet Commonly known as:  PROTONIX Take 20 mg by mouth daily.   polyethylene glycol packet Commonly known as:  MIRALAX / GLYCOLAX Take 17 g by mouth daily as needed for mild  constipation.   ranitidine 300 MG tablet Commonly known as:  ZANTAC Take 300 mg by mouth daily as needed for heartburn.   sevelamer carbonate 800 MG tablet Commonly known as:  RENVELA Take 2 tablets (1,600 mg total) by mouth 3 (three) times daily with meals. What changed:    how much to take  additional instructions       Discharge Instructions: Please refer to Patient Instructions section of EMR for full details.  Patient was counseled important signs and symptoms that should prompt return to medical care, changes in medications, dietary instructions, activity restrictions, and follow up appointments.   Follow-Up Appointments:   Guadalupe Dawn, MD 10/26/2017, 6:18 PM PGY-1, [place]

## 2017-10-26 NOTE — H&P (Deleted)
[facility] Discharge Summary  Patient name: Jeremy Mccoy Medical record number: [id number] Date of birth: 1965/06/06 Age: 52 y.o. Gender: male Date of Admission: 10/23/2017  Date of Discharge: 10/26/2017 Admitting Physician: Guadalupe Dawn, MD  Primary Care Provider: Patient, No Pcp Per Consultants: VVS, IR, nephrology  Indication for Hospitalization: hyperkalemia  Discharge Diagnoses/Problem List:  Hyperkalemia esrd Anemia Hypertension hfpef Cocaine abuse  Disposition: home  Discharge Condition: stable  Discharge Exam: General:thin, caucasian male, resting comfortably in dialysis bed. NAD Cardiovascular:rrr, no m/r/g. LUE radiocephalic fistula without thrill, arterial flow noted in distal distribution of radial artery. R IJ HD cath in place. Respiratory:lungs clear to ausculation bilaterally, no increased work of breathing Gastrointestinal:soft, NT, ND MSK:5/5 strength BUE, BLE. Psych:appropriate  Brief Hospital Course:  52 year old who presented on 6/10 with potassium of 7.0. Patient had clotted av fistula on 6/8. He was due to have IR declot procedure but was found to have k of 7.0. He had a temporary HD cath placed and underwent course of dialysis. Had av fistula declot on 6/11. Fistula did not draw well at HD on 6/12 and patient was evaluated by VVS. They felt the fistula was still working well enough and recommended another session. Patient underwent dialysis on 6/13 and the session went well. His HD cath was removed and he was discharged home with close follow up.  Patient is chronic cocaine user. He had no interest in stopping. Coreg was held on discharge. Issues for Follow Up:  1. Follow up with vascular surgery regarding longer term access for dialysis  Significant Procedures: IR LUE av fistula angioplasty  Significant Labs and Imaging:  Recent Labs  Lab 10/24/17 0753 10/25/17 0752 10/26/17 0508  WBC 4.4 4.4 4.4  HGB  11.1* 9.9* 10.9*  HCT 35.8* 30.6* 34.8*  PLT 152 124* 132*   Recent Labs  Lab 10/23/17 1623 10/24/17 0753 10/25/17 0558 10/25/17 0627 10/25/17 1330 10/26/17 0508  NA 138 139 133* 133* 135 136  K 4.5 5.6* 6.9* 6.9* 5.1 6.1*  CL 98* 101 98* 97* 93* 97*  CO2 28 26 23 22 29 23   GLUCOSE 135* 89 87 86 76 96  BUN 43* 48* 69* 69* 40* 61*  CREATININE 9.55* 10.57* 12.83* 12.88* 8.89* 11.66*  CALCIUM 8.9 9.2 8.8* 8.9 10.1 9.4  PHOS 3.0 6.5* 7.0*  --   --  7.3*  ALBUMIN 4.0 3.7 3.5  --   --  3.7      Results/Tests Pending at Time of Discharge:  Discharge Medications:  Allergies as of 10/26/2017   No Known Allergies     Medication List    STOP taking these medications   carvedilol 25 MG tablet Commonly known as:  COREG     TAKE these medications   acetaminophen 500 MG tablet Commonly known as:  TYLENOL Take 1 tablet (500 mg total) by mouth every 8 (eight) hours as needed for mild pain or fever. 6 times a day, 500 mg, 12 to 24 tabs a day, 6,000 to 12,000 mg daily for pain What changed:  additional instructions   amLODipine 10 MG tablet Commonly known as:  NORVASC Take 1 tablet (10 mg total) by mouth daily.   aspirin 81 MG chewable tablet Chew 1 tablet (81 mg total) by mouth daily.   cloNIDine 0.3 MG tablet Commonly known as:  CATAPRES Take 0.3 mg by mouth 3 (three) times daily.   feeding supplement (NEPRO CARB STEADY) Liqd Take 237 mLs by  mouth 3 (three) times daily as needed (Supplement).   hydrOXYzine 25 MG tablet Commonly known as:  ATARAX/VISTARIL Take 25 mg by mouth 3 (three) times daily as needed for itching.   losartan 100 MG tablet Commonly known as:  COZAAR Take 100 mg by mouth at bedtime.   multivitamin Tabs tablet Take 1 tablet by mouth at bedtime.   pantoprazole 20 MG tablet Commonly known as:  PROTONIX Take 20 mg by mouth daily.   polyethylene glycol packet Commonly known as:  MIRALAX / GLYCOLAX Take 17 g by mouth daily as needed for mild  constipation.   ranitidine 300 MG tablet Commonly known as:  ZANTAC Take 300 mg by mouth daily as needed for heartburn.   sevelamer carbonate 800 MG tablet Commonly known as:  RENVELA Take 2 tablets (1,600 mg total) by mouth 3 (three) times daily with meals. What changed:    how much to take  additional instructions       Discharge Instructions: Please refer to Patient Instructions section of EMR for full details.  Patient was counseled important signs and symptoms that should prompt return to medical care, changes in medications, dietary instructions, activity restrictions, and follow up appointments.   Follow-Up Appointments:   Guadalupe Dawn, MD 10/26/2017, 6:18 PM PGY-1, [place]

## 2017-10-27 DIAGNOSIS — D509 Iron deficiency anemia, unspecified: Secondary | ICD-10-CM | POA: Diagnosis not present

## 2017-10-27 DIAGNOSIS — N2581 Secondary hyperparathyroidism of renal origin: Secondary | ICD-10-CM | POA: Diagnosis not present

## 2017-10-27 DIAGNOSIS — E875 Hyperkalemia: Secondary | ICD-10-CM | POA: Diagnosis not present

## 2017-10-27 DIAGNOSIS — N186 End stage renal disease: Secondary | ICD-10-CM | POA: Diagnosis not present

## 2017-10-27 DIAGNOSIS — D631 Anemia in chronic kidney disease: Secondary | ICD-10-CM | POA: Diagnosis not present

## 2017-10-30 DIAGNOSIS — D509 Iron deficiency anemia, unspecified: Secondary | ICD-10-CM | POA: Diagnosis not present

## 2017-10-30 DIAGNOSIS — D631 Anemia in chronic kidney disease: Secondary | ICD-10-CM | POA: Diagnosis not present

## 2017-10-30 DIAGNOSIS — N2581 Secondary hyperparathyroidism of renal origin: Secondary | ICD-10-CM | POA: Diagnosis not present

## 2017-10-30 DIAGNOSIS — E875 Hyperkalemia: Secondary | ICD-10-CM | POA: Diagnosis not present

## 2017-10-30 DIAGNOSIS — N186 End stage renal disease: Secondary | ICD-10-CM | POA: Diagnosis not present

## 2017-11-01 DIAGNOSIS — D631 Anemia in chronic kidney disease: Secondary | ICD-10-CM | POA: Diagnosis not present

## 2017-11-01 DIAGNOSIS — N2581 Secondary hyperparathyroidism of renal origin: Secondary | ICD-10-CM | POA: Diagnosis not present

## 2017-11-01 DIAGNOSIS — E875 Hyperkalemia: Secondary | ICD-10-CM | POA: Diagnosis not present

## 2017-11-01 DIAGNOSIS — D509 Iron deficiency anemia, unspecified: Secondary | ICD-10-CM | POA: Diagnosis not present

## 2017-11-01 DIAGNOSIS — N186 End stage renal disease: Secondary | ICD-10-CM | POA: Diagnosis not present

## 2017-11-03 DIAGNOSIS — N186 End stage renal disease: Secondary | ICD-10-CM | POA: Diagnosis not present

## 2017-11-03 DIAGNOSIS — D509 Iron deficiency anemia, unspecified: Secondary | ICD-10-CM | POA: Diagnosis not present

## 2017-11-03 DIAGNOSIS — D631 Anemia in chronic kidney disease: Secondary | ICD-10-CM | POA: Diagnosis not present

## 2017-11-03 DIAGNOSIS — N2581 Secondary hyperparathyroidism of renal origin: Secondary | ICD-10-CM | POA: Diagnosis not present

## 2017-11-03 DIAGNOSIS — E875 Hyperkalemia: Secondary | ICD-10-CM | POA: Diagnosis not present

## 2017-11-06 DIAGNOSIS — D509 Iron deficiency anemia, unspecified: Secondary | ICD-10-CM | POA: Diagnosis not present

## 2017-11-06 DIAGNOSIS — N2581 Secondary hyperparathyroidism of renal origin: Secondary | ICD-10-CM | POA: Diagnosis not present

## 2017-11-06 DIAGNOSIS — E875 Hyperkalemia: Secondary | ICD-10-CM | POA: Diagnosis not present

## 2017-11-06 DIAGNOSIS — D631 Anemia in chronic kidney disease: Secondary | ICD-10-CM | POA: Diagnosis not present

## 2017-11-06 DIAGNOSIS — N186 End stage renal disease: Secondary | ICD-10-CM | POA: Diagnosis not present

## 2017-11-08 DIAGNOSIS — N2581 Secondary hyperparathyroidism of renal origin: Secondary | ICD-10-CM | POA: Diagnosis not present

## 2017-11-08 DIAGNOSIS — D631 Anemia in chronic kidney disease: Secondary | ICD-10-CM | POA: Diagnosis not present

## 2017-11-08 DIAGNOSIS — N186 End stage renal disease: Secondary | ICD-10-CM | POA: Diagnosis not present

## 2017-11-08 DIAGNOSIS — D509 Iron deficiency anemia, unspecified: Secondary | ICD-10-CM | POA: Diagnosis not present

## 2017-11-08 DIAGNOSIS — E875 Hyperkalemia: Secondary | ICD-10-CM | POA: Diagnosis not present

## 2017-11-10 DIAGNOSIS — N186 End stage renal disease: Secondary | ICD-10-CM | POA: Diagnosis not present

## 2017-11-10 DIAGNOSIS — D631 Anemia in chronic kidney disease: Secondary | ICD-10-CM | POA: Diagnosis not present

## 2017-11-10 DIAGNOSIS — D509 Iron deficiency anemia, unspecified: Secondary | ICD-10-CM | POA: Diagnosis not present

## 2017-11-10 DIAGNOSIS — E875 Hyperkalemia: Secondary | ICD-10-CM | POA: Diagnosis not present

## 2017-11-10 DIAGNOSIS — N2581 Secondary hyperparathyroidism of renal origin: Secondary | ICD-10-CM | POA: Diagnosis not present

## 2017-11-13 DIAGNOSIS — D631 Anemia in chronic kidney disease: Secondary | ICD-10-CM | POA: Diagnosis not present

## 2017-11-13 DIAGNOSIS — D509 Iron deficiency anemia, unspecified: Secondary | ICD-10-CM | POA: Diagnosis not present

## 2017-11-13 DIAGNOSIS — N186 End stage renal disease: Secondary | ICD-10-CM | POA: Diagnosis not present

## 2017-11-13 DIAGNOSIS — Z992 Dependence on renal dialysis: Secondary | ICD-10-CM | POA: Diagnosis not present

## 2017-11-13 DIAGNOSIS — I10 Essential (primary) hypertension: Secondary | ICD-10-CM | POA: Diagnosis not present

## 2017-11-13 DIAGNOSIS — E875 Hyperkalemia: Secondary | ICD-10-CM | POA: Diagnosis not present

## 2017-11-13 DIAGNOSIS — N2581 Secondary hyperparathyroidism of renal origin: Secondary | ICD-10-CM | POA: Diagnosis not present

## 2017-11-15 DIAGNOSIS — N2581 Secondary hyperparathyroidism of renal origin: Secondary | ICD-10-CM | POA: Diagnosis not present

## 2017-11-15 DIAGNOSIS — N186 End stage renal disease: Secondary | ICD-10-CM | POA: Diagnosis not present

## 2017-11-15 DIAGNOSIS — D631 Anemia in chronic kidney disease: Secondary | ICD-10-CM | POA: Diagnosis not present

## 2017-11-15 DIAGNOSIS — D509 Iron deficiency anemia, unspecified: Secondary | ICD-10-CM | POA: Diagnosis not present

## 2017-11-15 DIAGNOSIS — E875 Hyperkalemia: Secondary | ICD-10-CM | POA: Diagnosis not present

## 2017-11-17 DIAGNOSIS — N2581 Secondary hyperparathyroidism of renal origin: Secondary | ICD-10-CM | POA: Diagnosis not present

## 2017-11-17 DIAGNOSIS — E875 Hyperkalemia: Secondary | ICD-10-CM | POA: Diagnosis not present

## 2017-11-17 DIAGNOSIS — D509 Iron deficiency anemia, unspecified: Secondary | ICD-10-CM | POA: Diagnosis not present

## 2017-11-17 DIAGNOSIS — N186 End stage renal disease: Secondary | ICD-10-CM | POA: Diagnosis not present

## 2017-11-17 DIAGNOSIS — D631 Anemia in chronic kidney disease: Secondary | ICD-10-CM | POA: Diagnosis not present

## 2017-11-20 DIAGNOSIS — D631 Anemia in chronic kidney disease: Secondary | ICD-10-CM | POA: Diagnosis not present

## 2017-11-20 DIAGNOSIS — N186 End stage renal disease: Secondary | ICD-10-CM | POA: Diagnosis not present

## 2017-11-20 DIAGNOSIS — N2581 Secondary hyperparathyroidism of renal origin: Secondary | ICD-10-CM | POA: Diagnosis not present

## 2017-11-20 DIAGNOSIS — E875 Hyperkalemia: Secondary | ICD-10-CM | POA: Diagnosis not present

## 2017-11-20 DIAGNOSIS — D509 Iron deficiency anemia, unspecified: Secondary | ICD-10-CM | POA: Diagnosis not present

## 2017-11-22 DIAGNOSIS — N186 End stage renal disease: Secondary | ICD-10-CM | POA: Diagnosis not present

## 2017-11-22 DIAGNOSIS — D509 Iron deficiency anemia, unspecified: Secondary | ICD-10-CM | POA: Diagnosis not present

## 2017-11-22 DIAGNOSIS — D631 Anemia in chronic kidney disease: Secondary | ICD-10-CM | POA: Diagnosis not present

## 2017-11-22 DIAGNOSIS — E875 Hyperkalemia: Secondary | ICD-10-CM | POA: Diagnosis not present

## 2017-11-22 DIAGNOSIS — N2581 Secondary hyperparathyroidism of renal origin: Secondary | ICD-10-CM | POA: Diagnosis not present

## 2017-11-24 DIAGNOSIS — N2581 Secondary hyperparathyroidism of renal origin: Secondary | ICD-10-CM | POA: Diagnosis not present

## 2017-11-24 DIAGNOSIS — N186 End stage renal disease: Secondary | ICD-10-CM | POA: Diagnosis not present

## 2017-11-24 DIAGNOSIS — D509 Iron deficiency anemia, unspecified: Secondary | ICD-10-CM | POA: Diagnosis not present

## 2017-11-24 DIAGNOSIS — E875 Hyperkalemia: Secondary | ICD-10-CM | POA: Diagnosis not present

## 2017-11-24 DIAGNOSIS — D631 Anemia in chronic kidney disease: Secondary | ICD-10-CM | POA: Diagnosis not present

## 2017-11-27 DIAGNOSIS — D631 Anemia in chronic kidney disease: Secondary | ICD-10-CM | POA: Diagnosis not present

## 2017-11-27 DIAGNOSIS — D509 Iron deficiency anemia, unspecified: Secondary | ICD-10-CM | POA: Diagnosis not present

## 2017-11-27 DIAGNOSIS — N186 End stage renal disease: Secondary | ICD-10-CM | POA: Diagnosis not present

## 2017-11-27 DIAGNOSIS — E875 Hyperkalemia: Secondary | ICD-10-CM | POA: Diagnosis not present

## 2017-11-27 DIAGNOSIS — N2581 Secondary hyperparathyroidism of renal origin: Secondary | ICD-10-CM | POA: Diagnosis not present

## 2017-11-29 DIAGNOSIS — D509 Iron deficiency anemia, unspecified: Secondary | ICD-10-CM | POA: Diagnosis not present

## 2017-11-29 DIAGNOSIS — N2581 Secondary hyperparathyroidism of renal origin: Secondary | ICD-10-CM | POA: Diagnosis not present

## 2017-11-29 DIAGNOSIS — N186 End stage renal disease: Secondary | ICD-10-CM | POA: Diagnosis not present

## 2017-11-29 DIAGNOSIS — D631 Anemia in chronic kidney disease: Secondary | ICD-10-CM | POA: Diagnosis not present

## 2017-11-29 DIAGNOSIS — E875 Hyperkalemia: Secondary | ICD-10-CM | POA: Diagnosis not present

## 2017-12-01 DIAGNOSIS — D509 Iron deficiency anemia, unspecified: Secondary | ICD-10-CM | POA: Diagnosis not present

## 2017-12-01 DIAGNOSIS — N2581 Secondary hyperparathyroidism of renal origin: Secondary | ICD-10-CM | POA: Diagnosis not present

## 2017-12-01 DIAGNOSIS — D631 Anemia in chronic kidney disease: Secondary | ICD-10-CM | POA: Diagnosis not present

## 2017-12-01 DIAGNOSIS — E875 Hyperkalemia: Secondary | ICD-10-CM | POA: Diagnosis not present

## 2017-12-01 DIAGNOSIS — N186 End stage renal disease: Secondary | ICD-10-CM | POA: Diagnosis not present

## 2017-12-04 DIAGNOSIS — N186 End stage renal disease: Secondary | ICD-10-CM | POA: Diagnosis not present

## 2017-12-04 DIAGNOSIS — D509 Iron deficiency anemia, unspecified: Secondary | ICD-10-CM | POA: Diagnosis not present

## 2017-12-04 DIAGNOSIS — N2581 Secondary hyperparathyroidism of renal origin: Secondary | ICD-10-CM | POA: Diagnosis not present

## 2017-12-04 DIAGNOSIS — D631 Anemia in chronic kidney disease: Secondary | ICD-10-CM | POA: Diagnosis not present

## 2017-12-04 DIAGNOSIS — E875 Hyperkalemia: Secondary | ICD-10-CM | POA: Diagnosis not present

## 2017-12-06 DIAGNOSIS — D631 Anemia in chronic kidney disease: Secondary | ICD-10-CM | POA: Diagnosis not present

## 2017-12-06 DIAGNOSIS — N186 End stage renal disease: Secondary | ICD-10-CM | POA: Diagnosis not present

## 2017-12-06 DIAGNOSIS — D509 Iron deficiency anemia, unspecified: Secondary | ICD-10-CM | POA: Diagnosis not present

## 2017-12-06 DIAGNOSIS — N2581 Secondary hyperparathyroidism of renal origin: Secondary | ICD-10-CM | POA: Diagnosis not present

## 2017-12-06 DIAGNOSIS — E875 Hyperkalemia: Secondary | ICD-10-CM | POA: Diagnosis not present

## 2017-12-09 DIAGNOSIS — N2581 Secondary hyperparathyroidism of renal origin: Secondary | ICD-10-CM | POA: Diagnosis not present

## 2017-12-09 DIAGNOSIS — N186 End stage renal disease: Secondary | ICD-10-CM | POA: Diagnosis not present

## 2017-12-09 DIAGNOSIS — D509 Iron deficiency anemia, unspecified: Secondary | ICD-10-CM | POA: Diagnosis not present

## 2017-12-09 DIAGNOSIS — E875 Hyperkalemia: Secondary | ICD-10-CM | POA: Diagnosis not present

## 2017-12-09 DIAGNOSIS — D631 Anemia in chronic kidney disease: Secondary | ICD-10-CM | POA: Diagnosis not present

## 2017-12-11 DIAGNOSIS — D631 Anemia in chronic kidney disease: Secondary | ICD-10-CM | POA: Diagnosis not present

## 2017-12-11 DIAGNOSIS — N186 End stage renal disease: Secondary | ICD-10-CM | POA: Diagnosis not present

## 2017-12-11 DIAGNOSIS — D509 Iron deficiency anemia, unspecified: Secondary | ICD-10-CM | POA: Diagnosis not present

## 2017-12-11 DIAGNOSIS — E875 Hyperkalemia: Secondary | ICD-10-CM | POA: Diagnosis not present

## 2017-12-11 DIAGNOSIS — N2581 Secondary hyperparathyroidism of renal origin: Secondary | ICD-10-CM | POA: Diagnosis not present

## 2017-12-13 DIAGNOSIS — N186 End stage renal disease: Secondary | ICD-10-CM | POA: Diagnosis not present

## 2017-12-13 DIAGNOSIS — E875 Hyperkalemia: Secondary | ICD-10-CM | POA: Diagnosis not present

## 2017-12-13 DIAGNOSIS — D631 Anemia in chronic kidney disease: Secondary | ICD-10-CM | POA: Diagnosis not present

## 2017-12-13 DIAGNOSIS — D509 Iron deficiency anemia, unspecified: Secondary | ICD-10-CM | POA: Diagnosis not present

## 2017-12-13 DIAGNOSIS — N2581 Secondary hyperparathyroidism of renal origin: Secondary | ICD-10-CM | POA: Diagnosis not present

## 2017-12-14 DIAGNOSIS — I10 Essential (primary) hypertension: Secondary | ICD-10-CM | POA: Diagnosis not present

## 2017-12-14 DIAGNOSIS — N186 End stage renal disease: Secondary | ICD-10-CM | POA: Diagnosis not present

## 2017-12-14 DIAGNOSIS — Z992 Dependence on renal dialysis: Secondary | ICD-10-CM | POA: Diagnosis not present

## 2017-12-15 DIAGNOSIS — D631 Anemia in chronic kidney disease: Secondary | ICD-10-CM | POA: Diagnosis not present

## 2017-12-15 DIAGNOSIS — E875 Hyperkalemia: Secondary | ICD-10-CM | POA: Diagnosis not present

## 2017-12-15 DIAGNOSIS — N186 End stage renal disease: Secondary | ICD-10-CM | POA: Diagnosis not present

## 2017-12-15 DIAGNOSIS — N2581 Secondary hyperparathyroidism of renal origin: Secondary | ICD-10-CM | POA: Diagnosis not present

## 2017-12-15 DIAGNOSIS — D509 Iron deficiency anemia, unspecified: Secondary | ICD-10-CM | POA: Diagnosis not present

## 2017-12-18 DIAGNOSIS — D631 Anemia in chronic kidney disease: Secondary | ICD-10-CM | POA: Diagnosis not present

## 2017-12-18 DIAGNOSIS — N186 End stage renal disease: Secondary | ICD-10-CM | POA: Diagnosis not present

## 2017-12-18 DIAGNOSIS — N2581 Secondary hyperparathyroidism of renal origin: Secondary | ICD-10-CM | POA: Diagnosis not present

## 2017-12-18 DIAGNOSIS — D509 Iron deficiency anemia, unspecified: Secondary | ICD-10-CM | POA: Diagnosis not present

## 2017-12-18 DIAGNOSIS — E875 Hyperkalemia: Secondary | ICD-10-CM | POA: Diagnosis not present

## 2017-12-20 DIAGNOSIS — D509 Iron deficiency anemia, unspecified: Secondary | ICD-10-CM | POA: Diagnosis not present

## 2017-12-20 DIAGNOSIS — E875 Hyperkalemia: Secondary | ICD-10-CM | POA: Diagnosis not present

## 2017-12-20 DIAGNOSIS — D631 Anemia in chronic kidney disease: Secondary | ICD-10-CM | POA: Diagnosis not present

## 2017-12-20 DIAGNOSIS — N186 End stage renal disease: Secondary | ICD-10-CM | POA: Diagnosis not present

## 2017-12-20 DIAGNOSIS — N2581 Secondary hyperparathyroidism of renal origin: Secondary | ICD-10-CM | POA: Diagnosis not present

## 2017-12-22 DIAGNOSIS — D509 Iron deficiency anemia, unspecified: Secondary | ICD-10-CM | POA: Diagnosis not present

## 2017-12-22 DIAGNOSIS — N186 End stage renal disease: Secondary | ICD-10-CM | POA: Diagnosis not present

## 2017-12-22 DIAGNOSIS — E875 Hyperkalemia: Secondary | ICD-10-CM | POA: Diagnosis not present

## 2017-12-22 DIAGNOSIS — D631 Anemia in chronic kidney disease: Secondary | ICD-10-CM | POA: Diagnosis not present

## 2017-12-22 DIAGNOSIS — N2581 Secondary hyperparathyroidism of renal origin: Secondary | ICD-10-CM | POA: Diagnosis not present

## 2017-12-25 DIAGNOSIS — D631 Anemia in chronic kidney disease: Secondary | ICD-10-CM | POA: Diagnosis not present

## 2017-12-25 DIAGNOSIS — N2581 Secondary hyperparathyroidism of renal origin: Secondary | ICD-10-CM | POA: Diagnosis not present

## 2017-12-25 DIAGNOSIS — D509 Iron deficiency anemia, unspecified: Secondary | ICD-10-CM | POA: Diagnosis not present

## 2017-12-25 DIAGNOSIS — N186 End stage renal disease: Secondary | ICD-10-CM | POA: Diagnosis not present

## 2017-12-25 DIAGNOSIS — E875 Hyperkalemia: Secondary | ICD-10-CM | POA: Diagnosis not present

## 2017-12-27 DIAGNOSIS — E875 Hyperkalemia: Secondary | ICD-10-CM | POA: Diagnosis not present

## 2017-12-27 DIAGNOSIS — D509 Iron deficiency anemia, unspecified: Secondary | ICD-10-CM | POA: Diagnosis not present

## 2017-12-27 DIAGNOSIS — N2581 Secondary hyperparathyroidism of renal origin: Secondary | ICD-10-CM | POA: Diagnosis not present

## 2017-12-27 DIAGNOSIS — N186 End stage renal disease: Secondary | ICD-10-CM | POA: Diagnosis not present

## 2017-12-27 DIAGNOSIS — D631 Anemia in chronic kidney disease: Secondary | ICD-10-CM | POA: Diagnosis not present

## 2017-12-29 DIAGNOSIS — E875 Hyperkalemia: Secondary | ICD-10-CM | POA: Diagnosis not present

## 2017-12-29 DIAGNOSIS — N2581 Secondary hyperparathyroidism of renal origin: Secondary | ICD-10-CM | POA: Diagnosis not present

## 2017-12-29 DIAGNOSIS — N186 End stage renal disease: Secondary | ICD-10-CM | POA: Diagnosis not present

## 2017-12-29 DIAGNOSIS — D509 Iron deficiency anemia, unspecified: Secondary | ICD-10-CM | POA: Diagnosis not present

## 2017-12-29 DIAGNOSIS — D631 Anemia in chronic kidney disease: Secondary | ICD-10-CM | POA: Diagnosis not present

## 2018-01-01 DIAGNOSIS — D509 Iron deficiency anemia, unspecified: Secondary | ICD-10-CM | POA: Diagnosis not present

## 2018-01-01 DIAGNOSIS — N2581 Secondary hyperparathyroidism of renal origin: Secondary | ICD-10-CM | POA: Diagnosis not present

## 2018-01-01 DIAGNOSIS — D631 Anemia in chronic kidney disease: Secondary | ICD-10-CM | POA: Diagnosis not present

## 2018-01-01 DIAGNOSIS — E875 Hyperkalemia: Secondary | ICD-10-CM | POA: Diagnosis not present

## 2018-01-01 DIAGNOSIS — N186 End stage renal disease: Secondary | ICD-10-CM | POA: Diagnosis not present

## 2018-01-03 DIAGNOSIS — D509 Iron deficiency anemia, unspecified: Secondary | ICD-10-CM | POA: Diagnosis not present

## 2018-01-03 DIAGNOSIS — N2581 Secondary hyperparathyroidism of renal origin: Secondary | ICD-10-CM | POA: Diagnosis not present

## 2018-01-03 DIAGNOSIS — N186 End stage renal disease: Secondary | ICD-10-CM | POA: Diagnosis not present

## 2018-01-03 DIAGNOSIS — D631 Anemia in chronic kidney disease: Secondary | ICD-10-CM | POA: Diagnosis not present

## 2018-01-03 DIAGNOSIS — E875 Hyperkalemia: Secondary | ICD-10-CM | POA: Diagnosis not present

## 2018-01-05 DIAGNOSIS — N2581 Secondary hyperparathyroidism of renal origin: Secondary | ICD-10-CM | POA: Diagnosis not present

## 2018-01-05 DIAGNOSIS — N186 End stage renal disease: Secondary | ICD-10-CM | POA: Diagnosis not present

## 2018-01-05 DIAGNOSIS — D631 Anemia in chronic kidney disease: Secondary | ICD-10-CM | POA: Diagnosis not present

## 2018-01-05 DIAGNOSIS — E875 Hyperkalemia: Secondary | ICD-10-CM | POA: Diagnosis not present

## 2018-01-05 DIAGNOSIS — D509 Iron deficiency anemia, unspecified: Secondary | ICD-10-CM | POA: Diagnosis not present

## 2018-01-08 DIAGNOSIS — D631 Anemia in chronic kidney disease: Secondary | ICD-10-CM | POA: Diagnosis not present

## 2018-01-08 DIAGNOSIS — D509 Iron deficiency anemia, unspecified: Secondary | ICD-10-CM | POA: Diagnosis not present

## 2018-01-08 DIAGNOSIS — N2581 Secondary hyperparathyroidism of renal origin: Secondary | ICD-10-CM | POA: Diagnosis not present

## 2018-01-08 DIAGNOSIS — E875 Hyperkalemia: Secondary | ICD-10-CM | POA: Diagnosis not present

## 2018-01-08 DIAGNOSIS — N186 End stage renal disease: Secondary | ICD-10-CM | POA: Diagnosis not present

## 2018-01-10 DIAGNOSIS — N2581 Secondary hyperparathyroidism of renal origin: Secondary | ICD-10-CM | POA: Diagnosis not present

## 2018-01-10 DIAGNOSIS — N186 End stage renal disease: Secondary | ICD-10-CM | POA: Diagnosis not present

## 2018-01-10 DIAGNOSIS — D509 Iron deficiency anemia, unspecified: Secondary | ICD-10-CM | POA: Diagnosis not present

## 2018-01-10 DIAGNOSIS — D631 Anemia in chronic kidney disease: Secondary | ICD-10-CM | POA: Diagnosis not present

## 2018-01-10 DIAGNOSIS — E875 Hyperkalemia: Secondary | ICD-10-CM | POA: Diagnosis not present

## 2018-01-12 DIAGNOSIS — E875 Hyperkalemia: Secondary | ICD-10-CM | POA: Diagnosis not present

## 2018-01-12 DIAGNOSIS — D631 Anemia in chronic kidney disease: Secondary | ICD-10-CM | POA: Diagnosis not present

## 2018-01-12 DIAGNOSIS — N2581 Secondary hyperparathyroidism of renal origin: Secondary | ICD-10-CM | POA: Diagnosis not present

## 2018-01-12 DIAGNOSIS — D509 Iron deficiency anemia, unspecified: Secondary | ICD-10-CM | POA: Diagnosis not present

## 2018-01-12 DIAGNOSIS — N186 End stage renal disease: Secondary | ICD-10-CM | POA: Diagnosis not present

## 2018-01-13 ENCOUNTER — Other Ambulatory Visit: Payer: Self-pay | Admitting: Urgent Care

## 2018-01-14 DIAGNOSIS — N186 End stage renal disease: Secondary | ICD-10-CM | POA: Diagnosis not present

## 2018-01-14 DIAGNOSIS — I10 Essential (primary) hypertension: Secondary | ICD-10-CM | POA: Diagnosis not present

## 2018-01-14 DIAGNOSIS — Z992 Dependence on renal dialysis: Secondary | ICD-10-CM | POA: Diagnosis not present

## 2018-01-15 DIAGNOSIS — D631 Anemia in chronic kidney disease: Secondary | ICD-10-CM | POA: Diagnosis not present

## 2018-01-15 DIAGNOSIS — E875 Hyperkalemia: Secondary | ICD-10-CM | POA: Diagnosis not present

## 2018-01-15 DIAGNOSIS — N186 End stage renal disease: Secondary | ICD-10-CM | POA: Diagnosis not present

## 2018-01-15 DIAGNOSIS — N2581 Secondary hyperparathyroidism of renal origin: Secondary | ICD-10-CM | POA: Diagnosis not present

## 2018-01-15 DIAGNOSIS — D509 Iron deficiency anemia, unspecified: Secondary | ICD-10-CM | POA: Diagnosis not present

## 2018-01-16 NOTE — Telephone Encounter (Signed)
Prednisone refill Last Refill:09/22/17 #10  Last OV: 09/19/17 PCP: [place]: [place], [place]

## 2018-01-17 DIAGNOSIS — E875 Hyperkalemia: Secondary | ICD-10-CM | POA: Diagnosis not present

## 2018-01-17 DIAGNOSIS — D631 Anemia in chronic kidney disease: Secondary | ICD-10-CM | POA: Diagnosis not present

## 2018-01-17 DIAGNOSIS — N186 End stage renal disease: Secondary | ICD-10-CM | POA: Diagnosis not present

## 2018-01-17 DIAGNOSIS — N2581 Secondary hyperparathyroidism of renal origin: Secondary | ICD-10-CM | POA: Diagnosis not present

## 2018-01-17 DIAGNOSIS — D509 Iron deficiency anemia, unspecified: Secondary | ICD-10-CM | POA: Diagnosis not present

## 2018-01-18 NOTE — Telephone Encounter (Signed)
Patient needs an office visit.  We previously discussed that prednisone is not a medication that we can refill indefinitely.  A referral to an orthopedist may be more appropriate.  Please call and make an appointment for patient to see any provider at our clinic.

## 2018-01-19 DIAGNOSIS — N186 End stage renal disease: Secondary | ICD-10-CM | POA: Diagnosis not present

## 2018-01-19 DIAGNOSIS — D509 Iron deficiency anemia, unspecified: Secondary | ICD-10-CM | POA: Diagnosis not present

## 2018-01-19 DIAGNOSIS — D631 Anemia in chronic kidney disease: Secondary | ICD-10-CM | POA: Diagnosis not present

## 2018-01-19 DIAGNOSIS — N2581 Secondary hyperparathyroidism of renal origin: Secondary | ICD-10-CM | POA: Diagnosis not present

## 2018-01-19 DIAGNOSIS — E875 Hyperkalemia: Secondary | ICD-10-CM | POA: Diagnosis not present

## 2018-01-22 DIAGNOSIS — D631 Anemia in chronic kidney disease: Secondary | ICD-10-CM | POA: Diagnosis not present

## 2018-01-22 DIAGNOSIS — D509 Iron deficiency anemia, unspecified: Secondary | ICD-10-CM | POA: Diagnosis not present

## 2018-01-22 DIAGNOSIS — N2581 Secondary hyperparathyroidism of renal origin: Secondary | ICD-10-CM | POA: Diagnosis not present

## 2018-01-22 DIAGNOSIS — N186 End stage renal disease: Secondary | ICD-10-CM | POA: Diagnosis not present

## 2018-01-22 DIAGNOSIS — E875 Hyperkalemia: Secondary | ICD-10-CM | POA: Diagnosis not present

## 2018-01-24 DIAGNOSIS — E875 Hyperkalemia: Secondary | ICD-10-CM | POA: Diagnosis not present

## 2018-01-24 DIAGNOSIS — D631 Anemia in chronic kidney disease: Secondary | ICD-10-CM | POA: Diagnosis not present

## 2018-01-24 DIAGNOSIS — N2581 Secondary hyperparathyroidism of renal origin: Secondary | ICD-10-CM | POA: Diagnosis not present

## 2018-01-24 DIAGNOSIS — N186 End stage renal disease: Secondary | ICD-10-CM | POA: Diagnosis not present

## 2018-01-24 DIAGNOSIS — D509 Iron deficiency anemia, unspecified: Secondary | ICD-10-CM | POA: Diagnosis not present

## 2018-01-26 DIAGNOSIS — N2581 Secondary hyperparathyroidism of renal origin: Secondary | ICD-10-CM | POA: Diagnosis not present

## 2018-01-26 DIAGNOSIS — D509 Iron deficiency anemia, unspecified: Secondary | ICD-10-CM | POA: Diagnosis not present

## 2018-01-26 DIAGNOSIS — D631 Anemia in chronic kidney disease: Secondary | ICD-10-CM | POA: Diagnosis not present

## 2018-01-26 DIAGNOSIS — N186 End stage renal disease: Secondary | ICD-10-CM | POA: Diagnosis not present

## 2018-01-26 DIAGNOSIS — E875 Hyperkalemia: Secondary | ICD-10-CM | POA: Diagnosis not present

## 2018-01-29 DIAGNOSIS — N186 End stage renal disease: Secondary | ICD-10-CM | POA: Diagnosis not present

## 2018-01-29 DIAGNOSIS — D509 Iron deficiency anemia, unspecified: Secondary | ICD-10-CM | POA: Diagnosis not present

## 2018-01-29 DIAGNOSIS — D631 Anemia in chronic kidney disease: Secondary | ICD-10-CM | POA: Diagnosis not present

## 2018-01-29 DIAGNOSIS — E875 Hyperkalemia: Secondary | ICD-10-CM | POA: Diagnosis not present

## 2018-01-29 DIAGNOSIS — N2581 Secondary hyperparathyroidism of renal origin: Secondary | ICD-10-CM | POA: Diagnosis not present

## 2018-01-31 DIAGNOSIS — N2581 Secondary hyperparathyroidism of renal origin: Secondary | ICD-10-CM | POA: Diagnosis not present

## 2018-01-31 DIAGNOSIS — D509 Iron deficiency anemia, unspecified: Secondary | ICD-10-CM | POA: Diagnosis not present

## 2018-01-31 DIAGNOSIS — N186 End stage renal disease: Secondary | ICD-10-CM | POA: Diagnosis not present

## 2018-01-31 DIAGNOSIS — E875 Hyperkalemia: Secondary | ICD-10-CM | POA: Diagnosis not present

## 2018-01-31 DIAGNOSIS — D631 Anemia in chronic kidney disease: Secondary | ICD-10-CM | POA: Diagnosis not present

## 2018-02-02 DIAGNOSIS — N186 End stage renal disease: Secondary | ICD-10-CM | POA: Diagnosis not present

## 2018-02-02 DIAGNOSIS — D631 Anemia in chronic kidney disease: Secondary | ICD-10-CM | POA: Diagnosis not present

## 2018-02-02 DIAGNOSIS — D509 Iron deficiency anemia, unspecified: Secondary | ICD-10-CM | POA: Diagnosis not present

## 2018-02-02 DIAGNOSIS — N2581 Secondary hyperparathyroidism of renal origin: Secondary | ICD-10-CM | POA: Diagnosis not present

## 2018-02-02 DIAGNOSIS — E875 Hyperkalemia: Secondary | ICD-10-CM | POA: Diagnosis not present

## 2018-02-05 DIAGNOSIS — N2581 Secondary hyperparathyroidism of renal origin: Secondary | ICD-10-CM | POA: Diagnosis not present

## 2018-02-05 DIAGNOSIS — E875 Hyperkalemia: Secondary | ICD-10-CM | POA: Diagnosis not present

## 2018-02-05 DIAGNOSIS — D631 Anemia in chronic kidney disease: Secondary | ICD-10-CM | POA: Diagnosis not present

## 2018-02-05 DIAGNOSIS — N186 End stage renal disease: Secondary | ICD-10-CM | POA: Diagnosis not present

## 2018-02-05 DIAGNOSIS — D509 Iron deficiency anemia, unspecified: Secondary | ICD-10-CM | POA: Diagnosis not present

## 2018-02-07 DIAGNOSIS — E875 Hyperkalemia: Secondary | ICD-10-CM | POA: Diagnosis not present

## 2018-02-07 DIAGNOSIS — D631 Anemia in chronic kidney disease: Secondary | ICD-10-CM | POA: Diagnosis not present

## 2018-02-07 DIAGNOSIS — N2581 Secondary hyperparathyroidism of renal origin: Secondary | ICD-10-CM | POA: Diagnosis not present

## 2018-02-07 DIAGNOSIS — N186 End stage renal disease: Secondary | ICD-10-CM | POA: Diagnosis not present

## 2018-02-07 DIAGNOSIS — D509 Iron deficiency anemia, unspecified: Secondary | ICD-10-CM | POA: Diagnosis not present

## 2018-02-09 DIAGNOSIS — E875 Hyperkalemia: Secondary | ICD-10-CM | POA: Diagnosis not present

## 2018-02-09 DIAGNOSIS — N2581 Secondary hyperparathyroidism of renal origin: Secondary | ICD-10-CM | POA: Diagnosis not present

## 2018-02-09 DIAGNOSIS — N186 End stage renal disease: Secondary | ICD-10-CM | POA: Diagnosis not present

## 2018-02-09 DIAGNOSIS — D631 Anemia in chronic kidney disease: Secondary | ICD-10-CM | POA: Diagnosis not present

## 2018-02-09 DIAGNOSIS — D509 Iron deficiency anemia, unspecified: Secondary | ICD-10-CM | POA: Diagnosis not present

## 2018-02-12 DIAGNOSIS — D509 Iron deficiency anemia, unspecified: Secondary | ICD-10-CM | POA: Diagnosis not present

## 2018-02-12 DIAGNOSIS — D631 Anemia in chronic kidney disease: Secondary | ICD-10-CM | POA: Diagnosis not present

## 2018-02-12 DIAGNOSIS — E875 Hyperkalemia: Secondary | ICD-10-CM | POA: Diagnosis not present

## 2018-02-12 DIAGNOSIS — N2581 Secondary hyperparathyroidism of renal origin: Secondary | ICD-10-CM | POA: Diagnosis not present

## 2018-02-12 DIAGNOSIS — N186 End stage renal disease: Secondary | ICD-10-CM | POA: Diagnosis not present

## 2018-02-13 DIAGNOSIS — I10 Essential (primary) hypertension: Secondary | ICD-10-CM | POA: Diagnosis not present

## 2018-02-13 DIAGNOSIS — Z992 Dependence on renal dialysis: Secondary | ICD-10-CM | POA: Diagnosis not present

## 2018-02-13 DIAGNOSIS — N186 End stage renal disease: Secondary | ICD-10-CM | POA: Diagnosis not present

## 2018-02-14 DIAGNOSIS — D509 Iron deficiency anemia, unspecified: Secondary | ICD-10-CM | POA: Diagnosis not present

## 2018-02-14 DIAGNOSIS — N2581 Secondary hyperparathyroidism of renal origin: Secondary | ICD-10-CM | POA: Diagnosis not present

## 2018-02-14 DIAGNOSIS — E875 Hyperkalemia: Secondary | ICD-10-CM | POA: Diagnosis not present

## 2018-02-14 DIAGNOSIS — D631 Anemia in chronic kidney disease: Secondary | ICD-10-CM | POA: Diagnosis not present

## 2018-02-14 DIAGNOSIS — N186 End stage renal disease: Secondary | ICD-10-CM | POA: Diagnosis not present

## 2018-02-16 DIAGNOSIS — D509 Iron deficiency anemia, unspecified: Secondary | ICD-10-CM | POA: Diagnosis not present

## 2018-02-16 DIAGNOSIS — N2581 Secondary hyperparathyroidism of renal origin: Secondary | ICD-10-CM | POA: Diagnosis not present

## 2018-02-16 DIAGNOSIS — E875 Hyperkalemia: Secondary | ICD-10-CM | POA: Diagnosis not present

## 2018-02-16 DIAGNOSIS — D631 Anemia in chronic kidney disease: Secondary | ICD-10-CM | POA: Diagnosis not present

## 2018-02-16 DIAGNOSIS — N186 End stage renal disease: Secondary | ICD-10-CM | POA: Diagnosis not present

## 2018-02-19 DIAGNOSIS — D509 Iron deficiency anemia, unspecified: Secondary | ICD-10-CM | POA: Diagnosis not present

## 2018-02-19 DIAGNOSIS — E875 Hyperkalemia: Secondary | ICD-10-CM | POA: Diagnosis not present

## 2018-02-19 DIAGNOSIS — N186 End stage renal disease: Secondary | ICD-10-CM | POA: Diagnosis not present

## 2018-02-19 DIAGNOSIS — N2581 Secondary hyperparathyroidism of renal origin: Secondary | ICD-10-CM | POA: Diagnosis not present

## 2018-02-19 DIAGNOSIS — D631 Anemia in chronic kidney disease: Secondary | ICD-10-CM | POA: Diagnosis not present

## 2018-02-21 DIAGNOSIS — D631 Anemia in chronic kidney disease: Secondary | ICD-10-CM | POA: Diagnosis not present

## 2018-02-21 DIAGNOSIS — E875 Hyperkalemia: Secondary | ICD-10-CM | POA: Diagnosis not present

## 2018-02-21 DIAGNOSIS — N2581 Secondary hyperparathyroidism of renal origin: Secondary | ICD-10-CM | POA: Diagnosis not present

## 2018-02-21 DIAGNOSIS — N186 End stage renal disease: Secondary | ICD-10-CM | POA: Diagnosis not present

## 2018-02-21 DIAGNOSIS — D509 Iron deficiency anemia, unspecified: Secondary | ICD-10-CM | POA: Diagnosis not present

## 2018-02-23 DIAGNOSIS — N2581 Secondary hyperparathyroidism of renal origin: Secondary | ICD-10-CM | POA: Diagnosis not present

## 2018-02-23 DIAGNOSIS — N186 End stage renal disease: Secondary | ICD-10-CM | POA: Diagnosis not present

## 2018-02-23 DIAGNOSIS — E875 Hyperkalemia: Secondary | ICD-10-CM | POA: Diagnosis not present

## 2018-02-23 DIAGNOSIS — D631 Anemia in chronic kidney disease: Secondary | ICD-10-CM | POA: Diagnosis not present

## 2018-02-23 DIAGNOSIS — D509 Iron deficiency anemia, unspecified: Secondary | ICD-10-CM | POA: Diagnosis not present

## 2018-02-26 DIAGNOSIS — D631 Anemia in chronic kidney disease: Secondary | ICD-10-CM | POA: Diagnosis not present

## 2018-02-26 DIAGNOSIS — E875 Hyperkalemia: Secondary | ICD-10-CM | POA: Diagnosis not present

## 2018-02-26 DIAGNOSIS — N186 End stage renal disease: Secondary | ICD-10-CM | POA: Diagnosis not present

## 2018-02-26 DIAGNOSIS — D509 Iron deficiency anemia, unspecified: Secondary | ICD-10-CM | POA: Diagnosis not present

## 2018-02-26 DIAGNOSIS — N2581 Secondary hyperparathyroidism of renal origin: Secondary | ICD-10-CM | POA: Diagnosis not present

## 2018-02-28 DIAGNOSIS — N2581 Secondary hyperparathyroidism of renal origin: Secondary | ICD-10-CM | POA: Diagnosis not present

## 2018-02-28 DIAGNOSIS — D631 Anemia in chronic kidney disease: Secondary | ICD-10-CM | POA: Diagnosis not present

## 2018-02-28 DIAGNOSIS — N186 End stage renal disease: Secondary | ICD-10-CM | POA: Diagnosis not present

## 2018-02-28 DIAGNOSIS — E875 Hyperkalemia: Secondary | ICD-10-CM | POA: Diagnosis not present

## 2018-02-28 DIAGNOSIS — D509 Iron deficiency anemia, unspecified: Secondary | ICD-10-CM | POA: Diagnosis not present

## 2018-03-02 DIAGNOSIS — E875 Hyperkalemia: Secondary | ICD-10-CM | POA: Diagnosis not present

## 2018-03-02 DIAGNOSIS — D631 Anemia in chronic kidney disease: Secondary | ICD-10-CM | POA: Diagnosis not present

## 2018-03-02 DIAGNOSIS — D509 Iron deficiency anemia, unspecified: Secondary | ICD-10-CM | POA: Diagnosis not present

## 2018-03-02 DIAGNOSIS — N186 End stage renal disease: Secondary | ICD-10-CM | POA: Diagnosis not present

## 2018-03-02 DIAGNOSIS — N2581 Secondary hyperparathyroidism of renal origin: Secondary | ICD-10-CM | POA: Diagnosis not present

## 2018-03-05 DIAGNOSIS — N2581 Secondary hyperparathyroidism of renal origin: Secondary | ICD-10-CM | POA: Diagnosis not present

## 2018-03-05 DIAGNOSIS — D509 Iron deficiency anemia, unspecified: Secondary | ICD-10-CM | POA: Diagnosis not present

## 2018-03-05 DIAGNOSIS — E875 Hyperkalemia: Secondary | ICD-10-CM | POA: Diagnosis not present

## 2018-03-05 DIAGNOSIS — N186 End stage renal disease: Secondary | ICD-10-CM | POA: Diagnosis not present

## 2018-03-05 DIAGNOSIS — D631 Anemia in chronic kidney disease: Secondary | ICD-10-CM | POA: Diagnosis not present

## 2018-03-09 DIAGNOSIS — N186 End stage renal disease: Secondary | ICD-10-CM | POA: Diagnosis not present

## 2018-03-09 DIAGNOSIS — N2581 Secondary hyperparathyroidism of renal origin: Secondary | ICD-10-CM | POA: Diagnosis not present

## 2018-03-09 DIAGNOSIS — D509 Iron deficiency anemia, unspecified: Secondary | ICD-10-CM | POA: Diagnosis not present

## 2018-03-09 DIAGNOSIS — E875 Hyperkalemia: Secondary | ICD-10-CM | POA: Diagnosis not present

## 2018-03-09 DIAGNOSIS — D631 Anemia in chronic kidney disease: Secondary | ICD-10-CM | POA: Diagnosis not present

## 2018-03-12 DIAGNOSIS — N186 End stage renal disease: Secondary | ICD-10-CM | POA: Diagnosis not present

## 2018-03-12 DIAGNOSIS — D509 Iron deficiency anemia, unspecified: Secondary | ICD-10-CM | POA: Diagnosis not present

## 2018-03-12 DIAGNOSIS — D631 Anemia in chronic kidney disease: Secondary | ICD-10-CM | POA: Diagnosis not present

## 2018-03-12 DIAGNOSIS — E875 Hyperkalemia: Secondary | ICD-10-CM | POA: Diagnosis not present

## 2018-03-12 DIAGNOSIS — N2581 Secondary hyperparathyroidism of renal origin: Secondary | ICD-10-CM | POA: Diagnosis not present

## 2018-03-14 DIAGNOSIS — E875 Hyperkalemia: Secondary | ICD-10-CM | POA: Diagnosis not present

## 2018-03-14 DIAGNOSIS — D631 Anemia in chronic kidney disease: Secondary | ICD-10-CM | POA: Diagnosis not present

## 2018-03-14 DIAGNOSIS — N186 End stage renal disease: Secondary | ICD-10-CM | POA: Diagnosis not present

## 2018-03-14 DIAGNOSIS — N2581 Secondary hyperparathyroidism of renal origin: Secondary | ICD-10-CM | POA: Diagnosis not present

## 2018-03-14 DIAGNOSIS — D509 Iron deficiency anemia, unspecified: Secondary | ICD-10-CM | POA: Diagnosis not present

## 2018-03-16 DIAGNOSIS — N186 End stage renal disease: Secondary | ICD-10-CM | POA: Diagnosis not present

## 2018-03-16 DIAGNOSIS — E875 Hyperkalemia: Secondary | ICD-10-CM | POA: Diagnosis not present

## 2018-03-16 DIAGNOSIS — D631 Anemia in chronic kidney disease: Secondary | ICD-10-CM | POA: Diagnosis not present

## 2018-03-16 DIAGNOSIS — I10 Essential (primary) hypertension: Secondary | ICD-10-CM | POA: Diagnosis not present

## 2018-03-16 DIAGNOSIS — Z992 Dependence on renal dialysis: Secondary | ICD-10-CM | POA: Diagnosis not present

## 2018-03-16 DIAGNOSIS — N2581 Secondary hyperparathyroidism of renal origin: Secondary | ICD-10-CM | POA: Diagnosis not present

## 2018-03-19 DIAGNOSIS — N186 End stage renal disease: Secondary | ICD-10-CM | POA: Diagnosis not present

## 2018-03-19 DIAGNOSIS — N2581 Secondary hyperparathyroidism of renal origin: Secondary | ICD-10-CM | POA: Diagnosis not present

## 2018-03-19 DIAGNOSIS — D631 Anemia in chronic kidney disease: Secondary | ICD-10-CM | POA: Diagnosis not present

## 2018-03-19 DIAGNOSIS — E875 Hyperkalemia: Secondary | ICD-10-CM | POA: Diagnosis not present

## 2018-03-21 DIAGNOSIS — N2581 Secondary hyperparathyroidism of renal origin: Secondary | ICD-10-CM | POA: Diagnosis not present

## 2018-03-21 DIAGNOSIS — E875 Hyperkalemia: Secondary | ICD-10-CM | POA: Diagnosis not present

## 2018-03-21 DIAGNOSIS — D631 Anemia in chronic kidney disease: Secondary | ICD-10-CM | POA: Diagnosis not present

## 2018-03-21 DIAGNOSIS — N186 End stage renal disease: Secondary | ICD-10-CM | POA: Diagnosis not present

## 2018-03-23 DIAGNOSIS — N2581 Secondary hyperparathyroidism of renal origin: Secondary | ICD-10-CM | POA: Diagnosis not present

## 2018-03-23 DIAGNOSIS — E875 Hyperkalemia: Secondary | ICD-10-CM | POA: Diagnosis not present

## 2018-03-23 DIAGNOSIS — D631 Anemia in chronic kidney disease: Secondary | ICD-10-CM | POA: Diagnosis not present

## 2018-03-23 DIAGNOSIS — N186 End stage renal disease: Secondary | ICD-10-CM | POA: Diagnosis not present

## 2018-03-26 DIAGNOSIS — D631 Anemia in chronic kidney disease: Secondary | ICD-10-CM | POA: Diagnosis not present

## 2018-03-26 DIAGNOSIS — E875 Hyperkalemia: Secondary | ICD-10-CM | POA: Diagnosis not present

## 2018-03-26 DIAGNOSIS — N2581 Secondary hyperparathyroidism of renal origin: Secondary | ICD-10-CM | POA: Diagnosis not present

## 2018-03-26 DIAGNOSIS — N186 End stage renal disease: Secondary | ICD-10-CM | POA: Diagnosis not present

## 2018-03-28 DIAGNOSIS — D631 Anemia in chronic kidney disease: Secondary | ICD-10-CM | POA: Diagnosis not present

## 2018-03-28 DIAGNOSIS — N186 End stage renal disease: Secondary | ICD-10-CM | POA: Diagnosis not present

## 2018-03-28 DIAGNOSIS — E875 Hyperkalemia: Secondary | ICD-10-CM | POA: Diagnosis not present

## 2018-03-28 DIAGNOSIS — N2581 Secondary hyperparathyroidism of renal origin: Secondary | ICD-10-CM | POA: Diagnosis not present

## 2018-03-31 DIAGNOSIS — N186 End stage renal disease: Secondary | ICD-10-CM | POA: Diagnosis not present

## 2018-03-31 DIAGNOSIS — N2581 Secondary hyperparathyroidism of renal origin: Secondary | ICD-10-CM | POA: Diagnosis not present

## 2018-03-31 DIAGNOSIS — E875 Hyperkalemia: Secondary | ICD-10-CM | POA: Diagnosis not present

## 2018-03-31 DIAGNOSIS — D631 Anemia in chronic kidney disease: Secondary | ICD-10-CM | POA: Diagnosis not present

## 2018-04-02 DIAGNOSIS — N2581 Secondary hyperparathyroidism of renal origin: Secondary | ICD-10-CM | POA: Diagnosis not present

## 2018-04-02 DIAGNOSIS — E875 Hyperkalemia: Secondary | ICD-10-CM | POA: Diagnosis not present

## 2018-04-02 DIAGNOSIS — N186 End stage renal disease: Secondary | ICD-10-CM | POA: Diagnosis not present

## 2018-04-02 DIAGNOSIS — D631 Anemia in chronic kidney disease: Secondary | ICD-10-CM | POA: Diagnosis not present

## 2018-04-05 DIAGNOSIS — D631 Anemia in chronic kidney disease: Secondary | ICD-10-CM | POA: Diagnosis not present

## 2018-04-05 DIAGNOSIS — N186 End stage renal disease: Secondary | ICD-10-CM | POA: Diagnosis not present

## 2018-04-05 DIAGNOSIS — N2581 Secondary hyperparathyroidism of renal origin: Secondary | ICD-10-CM | POA: Diagnosis not present

## 2018-04-05 DIAGNOSIS — E875 Hyperkalemia: Secondary | ICD-10-CM | POA: Diagnosis not present

## 2018-04-06 DIAGNOSIS — N2581 Secondary hyperparathyroidism of renal origin: Secondary | ICD-10-CM | POA: Diagnosis not present

## 2018-04-06 DIAGNOSIS — E875 Hyperkalemia: Secondary | ICD-10-CM | POA: Diagnosis not present

## 2018-04-06 DIAGNOSIS — D631 Anemia in chronic kidney disease: Secondary | ICD-10-CM | POA: Diagnosis not present

## 2018-04-06 DIAGNOSIS — N186 End stage renal disease: Secondary | ICD-10-CM | POA: Diagnosis not present

## 2018-04-08 DIAGNOSIS — N186 End stage renal disease: Secondary | ICD-10-CM | POA: Diagnosis not present

## 2018-04-08 DIAGNOSIS — N2581 Secondary hyperparathyroidism of renal origin: Secondary | ICD-10-CM | POA: Diagnosis not present

## 2018-04-08 DIAGNOSIS — D631 Anemia in chronic kidney disease: Secondary | ICD-10-CM | POA: Diagnosis not present

## 2018-04-08 DIAGNOSIS — E875 Hyperkalemia: Secondary | ICD-10-CM | POA: Diagnosis not present

## 2018-04-10 DIAGNOSIS — E875 Hyperkalemia: Secondary | ICD-10-CM | POA: Diagnosis not present

## 2018-04-10 DIAGNOSIS — D631 Anemia in chronic kidney disease: Secondary | ICD-10-CM | POA: Diagnosis not present

## 2018-04-10 DIAGNOSIS — N186 End stage renal disease: Secondary | ICD-10-CM | POA: Diagnosis not present

## 2018-04-10 DIAGNOSIS — N2581 Secondary hyperparathyroidism of renal origin: Secondary | ICD-10-CM | POA: Diagnosis not present

## 2018-04-13 DIAGNOSIS — D631 Anemia in chronic kidney disease: Secondary | ICD-10-CM | POA: Diagnosis not present

## 2018-04-13 DIAGNOSIS — E875 Hyperkalemia: Secondary | ICD-10-CM | POA: Diagnosis not present

## 2018-04-13 DIAGNOSIS — N2581 Secondary hyperparathyroidism of renal origin: Secondary | ICD-10-CM | POA: Diagnosis not present

## 2018-04-13 DIAGNOSIS — N186 End stage renal disease: Secondary | ICD-10-CM | POA: Diagnosis not present

## 2018-04-15 DIAGNOSIS — I10 Essential (primary) hypertension: Secondary | ICD-10-CM | POA: Diagnosis not present

## 2018-04-15 DIAGNOSIS — N186 End stage renal disease: Secondary | ICD-10-CM | POA: Diagnosis not present

## 2018-04-15 DIAGNOSIS — Z992 Dependence on renal dialysis: Secondary | ICD-10-CM | POA: Diagnosis not present

## 2018-04-16 DIAGNOSIS — D631 Anemia in chronic kidney disease: Secondary | ICD-10-CM | POA: Diagnosis not present

## 2018-04-16 DIAGNOSIS — N186 End stage renal disease: Secondary | ICD-10-CM | POA: Diagnosis not present

## 2018-04-16 DIAGNOSIS — E875 Hyperkalemia: Secondary | ICD-10-CM | POA: Diagnosis not present

## 2018-04-16 DIAGNOSIS — N2581 Secondary hyperparathyroidism of renal origin: Secondary | ICD-10-CM | POA: Diagnosis not present

## 2018-04-18 DIAGNOSIS — N2581 Secondary hyperparathyroidism of renal origin: Secondary | ICD-10-CM | POA: Diagnosis not present

## 2018-04-18 DIAGNOSIS — D631 Anemia in chronic kidney disease: Secondary | ICD-10-CM | POA: Diagnosis not present

## 2018-04-18 DIAGNOSIS — N186 End stage renal disease: Secondary | ICD-10-CM | POA: Diagnosis not present

## 2018-04-18 DIAGNOSIS — E875 Hyperkalemia: Secondary | ICD-10-CM | POA: Diagnosis not present

## 2018-04-20 DIAGNOSIS — N2581 Secondary hyperparathyroidism of renal origin: Secondary | ICD-10-CM | POA: Diagnosis not present

## 2018-04-20 DIAGNOSIS — D631 Anemia in chronic kidney disease: Secondary | ICD-10-CM | POA: Diagnosis not present

## 2018-04-20 DIAGNOSIS — E875 Hyperkalemia: Secondary | ICD-10-CM | POA: Diagnosis not present

## 2018-04-20 DIAGNOSIS — N186 End stage renal disease: Secondary | ICD-10-CM | POA: Diagnosis not present

## 2018-04-23 DIAGNOSIS — D631 Anemia in chronic kidney disease: Secondary | ICD-10-CM | POA: Diagnosis not present

## 2018-04-23 DIAGNOSIS — E875 Hyperkalemia: Secondary | ICD-10-CM | POA: Diagnosis not present

## 2018-04-23 DIAGNOSIS — N186 End stage renal disease: Secondary | ICD-10-CM | POA: Diagnosis not present

## 2018-04-23 DIAGNOSIS — N2581 Secondary hyperparathyroidism of renal origin: Secondary | ICD-10-CM | POA: Diagnosis not present

## 2018-04-25 DIAGNOSIS — N186 End stage renal disease: Secondary | ICD-10-CM | POA: Diagnosis not present

## 2018-04-25 DIAGNOSIS — E875 Hyperkalemia: Secondary | ICD-10-CM | POA: Diagnosis not present

## 2018-04-25 DIAGNOSIS — D631 Anemia in chronic kidney disease: Secondary | ICD-10-CM | POA: Diagnosis not present

## 2018-04-25 DIAGNOSIS — N2581 Secondary hyperparathyroidism of renal origin: Secondary | ICD-10-CM | POA: Diagnosis not present

## 2018-04-27 DIAGNOSIS — E875 Hyperkalemia: Secondary | ICD-10-CM | POA: Diagnosis not present

## 2018-04-27 DIAGNOSIS — N2581 Secondary hyperparathyroidism of renal origin: Secondary | ICD-10-CM | POA: Diagnosis not present

## 2018-04-27 DIAGNOSIS — D631 Anemia in chronic kidney disease: Secondary | ICD-10-CM | POA: Diagnosis not present

## 2018-04-27 DIAGNOSIS — N186 End stage renal disease: Secondary | ICD-10-CM | POA: Diagnosis not present

## 2018-04-30 DIAGNOSIS — N186 End stage renal disease: Secondary | ICD-10-CM | POA: Diagnosis not present

## 2018-04-30 DIAGNOSIS — N2581 Secondary hyperparathyroidism of renal origin: Secondary | ICD-10-CM | POA: Diagnosis not present

## 2018-04-30 DIAGNOSIS — D631 Anemia in chronic kidney disease: Secondary | ICD-10-CM | POA: Diagnosis not present

## 2018-04-30 DIAGNOSIS — E875 Hyperkalemia: Secondary | ICD-10-CM | POA: Diagnosis not present

## 2018-05-02 DIAGNOSIS — N2581 Secondary hyperparathyroidism of renal origin: Secondary | ICD-10-CM | POA: Diagnosis not present

## 2018-05-02 DIAGNOSIS — E875 Hyperkalemia: Secondary | ICD-10-CM | POA: Diagnosis not present

## 2018-05-02 DIAGNOSIS — N186 End stage renal disease: Secondary | ICD-10-CM | POA: Diagnosis not present

## 2018-05-02 DIAGNOSIS — D631 Anemia in chronic kidney disease: Secondary | ICD-10-CM | POA: Diagnosis not present

## 2018-05-04 DIAGNOSIS — N2581 Secondary hyperparathyroidism of renal origin: Secondary | ICD-10-CM | POA: Diagnosis not present

## 2018-05-04 DIAGNOSIS — D631 Anemia in chronic kidney disease: Secondary | ICD-10-CM | POA: Diagnosis not present

## 2018-05-04 DIAGNOSIS — E875 Hyperkalemia: Secondary | ICD-10-CM | POA: Diagnosis not present

## 2018-05-04 DIAGNOSIS — N186 End stage renal disease: Secondary | ICD-10-CM | POA: Diagnosis not present

## 2018-05-06 DIAGNOSIS — D631 Anemia in chronic kidney disease: Secondary | ICD-10-CM | POA: Diagnosis not present

## 2018-05-06 DIAGNOSIS — N2581 Secondary hyperparathyroidism of renal origin: Secondary | ICD-10-CM | POA: Diagnosis not present

## 2018-05-06 DIAGNOSIS — N186 End stage renal disease: Secondary | ICD-10-CM | POA: Diagnosis not present

## 2018-05-06 DIAGNOSIS — E875 Hyperkalemia: Secondary | ICD-10-CM | POA: Diagnosis not present

## 2018-05-08 DIAGNOSIS — N2581 Secondary hyperparathyroidism of renal origin: Secondary | ICD-10-CM | POA: Diagnosis not present

## 2018-05-08 DIAGNOSIS — E875 Hyperkalemia: Secondary | ICD-10-CM | POA: Diagnosis not present

## 2018-05-08 DIAGNOSIS — D631 Anemia in chronic kidney disease: Secondary | ICD-10-CM | POA: Diagnosis not present

## 2018-05-08 DIAGNOSIS — N186 End stage renal disease: Secondary | ICD-10-CM | POA: Diagnosis not present

## 2018-05-11 DIAGNOSIS — N186 End stage renal disease: Secondary | ICD-10-CM | POA: Diagnosis not present

## 2018-05-11 DIAGNOSIS — N2581 Secondary hyperparathyroidism of renal origin: Secondary | ICD-10-CM | POA: Diagnosis not present

## 2018-05-11 DIAGNOSIS — E875 Hyperkalemia: Secondary | ICD-10-CM | POA: Diagnosis not present

## 2018-05-11 DIAGNOSIS — D631 Anemia in chronic kidney disease: Secondary | ICD-10-CM | POA: Diagnosis not present

## 2018-05-13 DIAGNOSIS — D631 Anemia in chronic kidney disease: Secondary | ICD-10-CM | POA: Diagnosis not present

## 2018-05-13 DIAGNOSIS — N2581 Secondary hyperparathyroidism of renal origin: Secondary | ICD-10-CM | POA: Diagnosis not present

## 2018-05-13 DIAGNOSIS — E875 Hyperkalemia: Secondary | ICD-10-CM | POA: Diagnosis not present

## 2018-05-13 DIAGNOSIS — N186 End stage renal disease: Secondary | ICD-10-CM | POA: Diagnosis not present

## 2018-05-15 DIAGNOSIS — N2581 Secondary hyperparathyroidism of renal origin: Secondary | ICD-10-CM | POA: Diagnosis not present

## 2018-05-15 DIAGNOSIS — E875 Hyperkalemia: Secondary | ICD-10-CM | POA: Diagnosis not present

## 2018-05-15 DIAGNOSIS — N186 End stage renal disease: Secondary | ICD-10-CM | POA: Diagnosis not present

## 2018-05-15 DIAGNOSIS — D631 Anemia in chronic kidney disease: Secondary | ICD-10-CM | POA: Diagnosis not present

## 2018-05-16 DIAGNOSIS — N186 End stage renal disease: Secondary | ICD-10-CM | POA: Diagnosis not present

## 2018-05-16 DIAGNOSIS — I10 Essential (primary) hypertension: Secondary | ICD-10-CM | POA: Diagnosis not present

## 2018-05-16 DIAGNOSIS — Z992 Dependence on renal dialysis: Secondary | ICD-10-CM | POA: Diagnosis not present

## 2018-05-18 DIAGNOSIS — N186 End stage renal disease: Secondary | ICD-10-CM | POA: Diagnosis not present

## 2018-05-18 DIAGNOSIS — E875 Hyperkalemia: Secondary | ICD-10-CM | POA: Diagnosis not present

## 2018-05-18 DIAGNOSIS — D631 Anemia in chronic kidney disease: Secondary | ICD-10-CM | POA: Diagnosis not present

## 2018-05-18 DIAGNOSIS — N2581 Secondary hyperparathyroidism of renal origin: Secondary | ICD-10-CM | POA: Diagnosis not present

## 2018-05-21 DIAGNOSIS — N2581 Secondary hyperparathyroidism of renal origin: Secondary | ICD-10-CM | POA: Diagnosis not present

## 2018-05-21 DIAGNOSIS — D631 Anemia in chronic kidney disease: Secondary | ICD-10-CM | POA: Diagnosis not present

## 2018-05-21 DIAGNOSIS — E875 Hyperkalemia: Secondary | ICD-10-CM | POA: Diagnosis not present

## 2018-05-21 DIAGNOSIS — N186 End stage renal disease: Secondary | ICD-10-CM | POA: Diagnosis not present

## 2018-05-23 DIAGNOSIS — E875 Hyperkalemia: Secondary | ICD-10-CM | POA: Diagnosis not present

## 2018-05-23 DIAGNOSIS — N186 End stage renal disease: Secondary | ICD-10-CM | POA: Diagnosis not present

## 2018-05-23 DIAGNOSIS — N2581 Secondary hyperparathyroidism of renal origin: Secondary | ICD-10-CM | POA: Diagnosis not present

## 2018-05-23 DIAGNOSIS — D631 Anemia in chronic kidney disease: Secondary | ICD-10-CM | POA: Diagnosis not present

## 2018-05-25 DIAGNOSIS — E875 Hyperkalemia: Secondary | ICD-10-CM | POA: Diagnosis not present

## 2018-05-25 DIAGNOSIS — D631 Anemia in chronic kidney disease: Secondary | ICD-10-CM | POA: Diagnosis not present

## 2018-05-25 DIAGNOSIS — N2581 Secondary hyperparathyroidism of renal origin: Secondary | ICD-10-CM | POA: Diagnosis not present

## 2018-05-25 DIAGNOSIS — N186 End stage renal disease: Secondary | ICD-10-CM | POA: Diagnosis not present

## 2018-05-28 DIAGNOSIS — N186 End stage renal disease: Secondary | ICD-10-CM | POA: Diagnosis not present

## 2018-05-28 DIAGNOSIS — N2581 Secondary hyperparathyroidism of renal origin: Secondary | ICD-10-CM | POA: Diagnosis not present

## 2018-05-28 DIAGNOSIS — D631 Anemia in chronic kidney disease: Secondary | ICD-10-CM | POA: Diagnosis not present

## 2018-05-28 DIAGNOSIS — E875 Hyperkalemia: Secondary | ICD-10-CM | POA: Diagnosis not present

## 2018-05-30 DIAGNOSIS — E875 Hyperkalemia: Secondary | ICD-10-CM | POA: Diagnosis not present

## 2018-05-30 DIAGNOSIS — N186 End stage renal disease: Secondary | ICD-10-CM | POA: Diagnosis not present

## 2018-05-30 DIAGNOSIS — N2581 Secondary hyperparathyroidism of renal origin: Secondary | ICD-10-CM | POA: Diagnosis not present

## 2018-05-30 DIAGNOSIS — D631 Anemia in chronic kidney disease: Secondary | ICD-10-CM | POA: Diagnosis not present

## 2018-06-01 DIAGNOSIS — E875 Hyperkalemia: Secondary | ICD-10-CM | POA: Diagnosis not present

## 2018-06-01 DIAGNOSIS — D631 Anemia in chronic kidney disease: Secondary | ICD-10-CM | POA: Diagnosis not present

## 2018-06-01 DIAGNOSIS — N186 End stage renal disease: Secondary | ICD-10-CM | POA: Diagnosis not present

## 2018-06-01 DIAGNOSIS — N2581 Secondary hyperparathyroidism of renal origin: Secondary | ICD-10-CM | POA: Diagnosis not present

## 2018-06-04 DIAGNOSIS — D631 Anemia in chronic kidney disease: Secondary | ICD-10-CM | POA: Diagnosis not present

## 2018-06-04 DIAGNOSIS — E875 Hyperkalemia: Secondary | ICD-10-CM | POA: Diagnosis not present

## 2018-06-04 DIAGNOSIS — N2581 Secondary hyperparathyroidism of renal origin: Secondary | ICD-10-CM | POA: Diagnosis not present

## 2018-06-04 DIAGNOSIS — N186 End stage renal disease: Secondary | ICD-10-CM | POA: Diagnosis not present

## 2018-06-06 DIAGNOSIS — E875 Hyperkalemia: Secondary | ICD-10-CM | POA: Diagnosis not present

## 2018-06-06 DIAGNOSIS — D631 Anemia in chronic kidney disease: Secondary | ICD-10-CM | POA: Diagnosis not present

## 2018-06-06 DIAGNOSIS — N2581 Secondary hyperparathyroidism of renal origin: Secondary | ICD-10-CM | POA: Diagnosis not present

## 2018-06-06 DIAGNOSIS — N186 End stage renal disease: Secondary | ICD-10-CM | POA: Diagnosis not present

## 2018-06-11 DIAGNOSIS — N186 End stage renal disease: Secondary | ICD-10-CM | POA: Diagnosis not present

## 2018-06-11 DIAGNOSIS — E875 Hyperkalemia: Secondary | ICD-10-CM | POA: Diagnosis not present

## 2018-06-11 DIAGNOSIS — N2581 Secondary hyperparathyroidism of renal origin: Secondary | ICD-10-CM | POA: Diagnosis not present

## 2018-06-11 DIAGNOSIS — D631 Anemia in chronic kidney disease: Secondary | ICD-10-CM | POA: Diagnosis not present

## 2018-06-13 DIAGNOSIS — E875 Hyperkalemia: Secondary | ICD-10-CM | POA: Diagnosis not present

## 2018-06-13 DIAGNOSIS — N186 End stage renal disease: Secondary | ICD-10-CM | POA: Diagnosis not present

## 2018-06-13 DIAGNOSIS — D631 Anemia in chronic kidney disease: Secondary | ICD-10-CM | POA: Diagnosis not present

## 2018-06-13 DIAGNOSIS — N2581 Secondary hyperparathyroidism of renal origin: Secondary | ICD-10-CM | POA: Diagnosis not present

## 2018-06-15 DIAGNOSIS — D631 Anemia in chronic kidney disease: Secondary | ICD-10-CM | POA: Diagnosis not present

## 2018-06-15 DIAGNOSIS — N186 End stage renal disease: Secondary | ICD-10-CM | POA: Diagnosis not present

## 2018-06-15 DIAGNOSIS — E875 Hyperkalemia: Secondary | ICD-10-CM | POA: Diagnosis not present

## 2018-06-15 DIAGNOSIS — N2581 Secondary hyperparathyroidism of renal origin: Secondary | ICD-10-CM | POA: Diagnosis not present

## 2018-06-16 DIAGNOSIS — I10 Essential (primary) hypertension: Secondary | ICD-10-CM | POA: Diagnosis not present

## 2018-06-16 DIAGNOSIS — Z992 Dependence on renal dialysis: Secondary | ICD-10-CM | POA: Diagnosis not present

## 2018-06-16 DIAGNOSIS — N186 End stage renal disease: Secondary | ICD-10-CM | POA: Diagnosis not present

## 2018-06-18 DIAGNOSIS — D509 Iron deficiency anemia, unspecified: Secondary | ICD-10-CM | POA: Diagnosis not present

## 2018-06-18 DIAGNOSIS — D631 Anemia in chronic kidney disease: Secondary | ICD-10-CM | POA: Diagnosis not present

## 2018-06-18 DIAGNOSIS — E875 Hyperkalemia: Secondary | ICD-10-CM | POA: Diagnosis not present

## 2018-06-18 DIAGNOSIS — N186 End stage renal disease: Secondary | ICD-10-CM | POA: Diagnosis not present

## 2018-06-18 DIAGNOSIS — N2581 Secondary hyperparathyroidism of renal origin: Secondary | ICD-10-CM | POA: Diagnosis not present

## 2018-06-20 DIAGNOSIS — N2581 Secondary hyperparathyroidism of renal origin: Secondary | ICD-10-CM | POA: Diagnosis not present

## 2018-06-20 DIAGNOSIS — N186 End stage renal disease: Secondary | ICD-10-CM | POA: Diagnosis not present

## 2018-06-20 DIAGNOSIS — E875 Hyperkalemia: Secondary | ICD-10-CM | POA: Diagnosis not present

## 2018-06-20 DIAGNOSIS — D631 Anemia in chronic kidney disease: Secondary | ICD-10-CM | POA: Diagnosis not present

## 2018-06-20 DIAGNOSIS — D509 Iron deficiency anemia, unspecified: Secondary | ICD-10-CM | POA: Diagnosis not present

## 2018-06-23 DIAGNOSIS — N2581 Secondary hyperparathyroidism of renal origin: Secondary | ICD-10-CM | POA: Diagnosis not present

## 2018-06-23 DIAGNOSIS — D631 Anemia in chronic kidney disease: Secondary | ICD-10-CM | POA: Diagnosis not present

## 2018-06-23 DIAGNOSIS — D509 Iron deficiency anemia, unspecified: Secondary | ICD-10-CM | POA: Diagnosis not present

## 2018-06-23 DIAGNOSIS — E875 Hyperkalemia: Secondary | ICD-10-CM | POA: Diagnosis not present

## 2018-06-23 DIAGNOSIS — N186 End stage renal disease: Secondary | ICD-10-CM | POA: Diagnosis not present

## 2018-06-25 DIAGNOSIS — D631 Anemia in chronic kidney disease: Secondary | ICD-10-CM | POA: Diagnosis not present

## 2018-06-25 DIAGNOSIS — E875 Hyperkalemia: Secondary | ICD-10-CM | POA: Diagnosis not present

## 2018-06-25 DIAGNOSIS — N186 End stage renal disease: Secondary | ICD-10-CM | POA: Diagnosis not present

## 2018-06-25 DIAGNOSIS — D509 Iron deficiency anemia, unspecified: Secondary | ICD-10-CM | POA: Diagnosis not present

## 2018-06-25 DIAGNOSIS — N2581 Secondary hyperparathyroidism of renal origin: Secondary | ICD-10-CM | POA: Diagnosis not present

## 2018-06-27 DIAGNOSIS — N2581 Secondary hyperparathyroidism of renal origin: Secondary | ICD-10-CM | POA: Diagnosis not present

## 2018-06-27 DIAGNOSIS — D631 Anemia in chronic kidney disease: Secondary | ICD-10-CM | POA: Diagnosis not present

## 2018-06-27 DIAGNOSIS — E875 Hyperkalemia: Secondary | ICD-10-CM | POA: Diagnosis not present

## 2018-06-27 DIAGNOSIS — D509 Iron deficiency anemia, unspecified: Secondary | ICD-10-CM | POA: Diagnosis not present

## 2018-06-27 DIAGNOSIS — N186 End stage renal disease: Secondary | ICD-10-CM | POA: Diagnosis not present

## 2018-06-29 DIAGNOSIS — N186 End stage renal disease: Secondary | ICD-10-CM | POA: Diagnosis not present

## 2018-06-29 DIAGNOSIS — E875 Hyperkalemia: Secondary | ICD-10-CM | POA: Diagnosis not present

## 2018-06-29 DIAGNOSIS — D631 Anemia in chronic kidney disease: Secondary | ICD-10-CM | POA: Diagnosis not present

## 2018-06-29 DIAGNOSIS — D509 Iron deficiency anemia, unspecified: Secondary | ICD-10-CM | POA: Diagnosis not present

## 2018-06-29 DIAGNOSIS — N2581 Secondary hyperparathyroidism of renal origin: Secondary | ICD-10-CM | POA: Diagnosis not present

## 2018-07-02 DIAGNOSIS — E875 Hyperkalemia: Secondary | ICD-10-CM | POA: Diagnosis not present

## 2018-07-02 DIAGNOSIS — D509 Iron deficiency anemia, unspecified: Secondary | ICD-10-CM | POA: Diagnosis not present

## 2018-07-02 DIAGNOSIS — N186 End stage renal disease: Secondary | ICD-10-CM | POA: Diagnosis not present

## 2018-07-02 DIAGNOSIS — N2581 Secondary hyperparathyroidism of renal origin: Secondary | ICD-10-CM | POA: Diagnosis not present

## 2018-07-02 DIAGNOSIS — D631 Anemia in chronic kidney disease: Secondary | ICD-10-CM | POA: Diagnosis not present

## 2018-07-04 DIAGNOSIS — D509 Iron deficiency anemia, unspecified: Secondary | ICD-10-CM | POA: Diagnosis not present

## 2018-07-04 DIAGNOSIS — E875 Hyperkalemia: Secondary | ICD-10-CM | POA: Diagnosis not present

## 2018-07-04 DIAGNOSIS — N2581 Secondary hyperparathyroidism of renal origin: Secondary | ICD-10-CM | POA: Diagnosis not present

## 2018-07-04 DIAGNOSIS — D631 Anemia in chronic kidney disease: Secondary | ICD-10-CM | POA: Diagnosis not present

## 2018-07-04 DIAGNOSIS — N186 End stage renal disease: Secondary | ICD-10-CM | POA: Diagnosis not present

## 2018-07-06 DIAGNOSIS — D509 Iron deficiency anemia, unspecified: Secondary | ICD-10-CM | POA: Diagnosis not present

## 2018-07-06 DIAGNOSIS — N2581 Secondary hyperparathyroidism of renal origin: Secondary | ICD-10-CM | POA: Diagnosis not present

## 2018-07-06 DIAGNOSIS — D631 Anemia in chronic kidney disease: Secondary | ICD-10-CM | POA: Diagnosis not present

## 2018-07-06 DIAGNOSIS — E875 Hyperkalemia: Secondary | ICD-10-CM | POA: Diagnosis not present

## 2018-07-06 DIAGNOSIS — N186 End stage renal disease: Secondary | ICD-10-CM | POA: Diagnosis not present

## 2018-07-09 DIAGNOSIS — N186 End stage renal disease: Secondary | ICD-10-CM | POA: Diagnosis not present

## 2018-07-09 DIAGNOSIS — D509 Iron deficiency anemia, unspecified: Secondary | ICD-10-CM | POA: Diagnosis not present

## 2018-07-09 DIAGNOSIS — D631 Anemia in chronic kidney disease: Secondary | ICD-10-CM | POA: Diagnosis not present

## 2018-07-09 DIAGNOSIS — E875 Hyperkalemia: Secondary | ICD-10-CM | POA: Diagnosis not present

## 2018-07-09 DIAGNOSIS — N2581 Secondary hyperparathyroidism of renal origin: Secondary | ICD-10-CM | POA: Diagnosis not present

## 2018-07-11 DIAGNOSIS — E875 Hyperkalemia: Secondary | ICD-10-CM | POA: Diagnosis not present

## 2018-07-11 DIAGNOSIS — D509 Iron deficiency anemia, unspecified: Secondary | ICD-10-CM | POA: Diagnosis not present

## 2018-07-11 DIAGNOSIS — N186 End stage renal disease: Secondary | ICD-10-CM | POA: Diagnosis not present

## 2018-07-11 DIAGNOSIS — D631 Anemia in chronic kidney disease: Secondary | ICD-10-CM | POA: Diagnosis not present

## 2018-07-11 DIAGNOSIS — N2581 Secondary hyperparathyroidism of renal origin: Secondary | ICD-10-CM | POA: Diagnosis not present

## 2018-07-13 DIAGNOSIS — E875 Hyperkalemia: Secondary | ICD-10-CM | POA: Diagnosis not present

## 2018-07-13 DIAGNOSIS — N186 End stage renal disease: Secondary | ICD-10-CM | POA: Diagnosis not present

## 2018-07-13 DIAGNOSIS — D509 Iron deficiency anemia, unspecified: Secondary | ICD-10-CM | POA: Diagnosis not present

## 2018-07-13 DIAGNOSIS — N2581 Secondary hyperparathyroidism of renal origin: Secondary | ICD-10-CM | POA: Diagnosis not present

## 2018-07-13 DIAGNOSIS — D631 Anemia in chronic kidney disease: Secondary | ICD-10-CM | POA: Diagnosis not present

## 2018-07-14 DIAGNOSIS — D509 Iron deficiency anemia, unspecified: Secondary | ICD-10-CM | POA: Diagnosis not present

## 2018-07-14 DIAGNOSIS — N186 End stage renal disease: Secondary | ICD-10-CM | POA: Diagnosis not present

## 2018-07-14 DIAGNOSIS — N2581 Secondary hyperparathyroidism of renal origin: Secondary | ICD-10-CM | POA: Diagnosis not present

## 2018-07-14 DIAGNOSIS — E875 Hyperkalemia: Secondary | ICD-10-CM | POA: Diagnosis not present

## 2018-07-14 DIAGNOSIS — D631 Anemia in chronic kidney disease: Secondary | ICD-10-CM | POA: Diagnosis not present

## 2018-07-15 DIAGNOSIS — N186 End stage renal disease: Secondary | ICD-10-CM | POA: Diagnosis not present

## 2018-07-15 DIAGNOSIS — I10 Essential (primary) hypertension: Secondary | ICD-10-CM | POA: Diagnosis not present

## 2018-07-15 DIAGNOSIS — Z992 Dependence on renal dialysis: Secondary | ICD-10-CM | POA: Diagnosis not present

## 2018-07-18 DIAGNOSIS — N186 End stage renal disease: Secondary | ICD-10-CM | POA: Diagnosis not present

## 2018-07-18 DIAGNOSIS — D509 Iron deficiency anemia, unspecified: Secondary | ICD-10-CM | POA: Diagnosis not present

## 2018-07-18 DIAGNOSIS — D631 Anemia in chronic kidney disease: Secondary | ICD-10-CM | POA: Diagnosis not present

## 2018-07-18 DIAGNOSIS — E875 Hyperkalemia: Secondary | ICD-10-CM | POA: Diagnosis not present

## 2018-07-18 DIAGNOSIS — N2581 Secondary hyperparathyroidism of renal origin: Secondary | ICD-10-CM | POA: Diagnosis not present

## 2018-07-20 DIAGNOSIS — N2581 Secondary hyperparathyroidism of renal origin: Secondary | ICD-10-CM | POA: Diagnosis not present

## 2018-07-20 DIAGNOSIS — E875 Hyperkalemia: Secondary | ICD-10-CM | POA: Diagnosis not present

## 2018-07-20 DIAGNOSIS — D509 Iron deficiency anemia, unspecified: Secondary | ICD-10-CM | POA: Diagnosis not present

## 2018-07-20 DIAGNOSIS — D631 Anemia in chronic kidney disease: Secondary | ICD-10-CM | POA: Diagnosis not present

## 2018-07-20 DIAGNOSIS — N186 End stage renal disease: Secondary | ICD-10-CM | POA: Diagnosis not present

## 2018-07-23 DIAGNOSIS — D631 Anemia in chronic kidney disease: Secondary | ICD-10-CM | POA: Diagnosis not present

## 2018-07-23 DIAGNOSIS — N186 End stage renal disease: Secondary | ICD-10-CM | POA: Diagnosis not present

## 2018-07-23 DIAGNOSIS — N2581 Secondary hyperparathyroidism of renal origin: Secondary | ICD-10-CM | POA: Diagnosis not present

## 2018-07-23 DIAGNOSIS — D509 Iron deficiency anemia, unspecified: Secondary | ICD-10-CM | POA: Diagnosis not present

## 2018-07-23 DIAGNOSIS — E875 Hyperkalemia: Secondary | ICD-10-CM | POA: Diagnosis not present

## 2018-07-25 DIAGNOSIS — D631 Anemia in chronic kidney disease: Secondary | ICD-10-CM | POA: Diagnosis not present

## 2018-07-25 DIAGNOSIS — E875 Hyperkalemia: Secondary | ICD-10-CM | POA: Diagnosis not present

## 2018-07-25 DIAGNOSIS — N2581 Secondary hyperparathyroidism of renal origin: Secondary | ICD-10-CM | POA: Diagnosis not present

## 2018-07-25 DIAGNOSIS — N186 End stage renal disease: Secondary | ICD-10-CM | POA: Diagnosis not present

## 2018-07-25 DIAGNOSIS — D509 Iron deficiency anemia, unspecified: Secondary | ICD-10-CM | POA: Diagnosis not present

## 2018-07-27 DIAGNOSIS — E875 Hyperkalemia: Secondary | ICD-10-CM | POA: Diagnosis not present

## 2018-07-27 DIAGNOSIS — D509 Iron deficiency anemia, unspecified: Secondary | ICD-10-CM | POA: Diagnosis not present

## 2018-07-27 DIAGNOSIS — D631 Anemia in chronic kidney disease: Secondary | ICD-10-CM | POA: Diagnosis not present

## 2018-07-27 DIAGNOSIS — N186 End stage renal disease: Secondary | ICD-10-CM | POA: Diagnosis not present

## 2018-07-27 DIAGNOSIS — N2581 Secondary hyperparathyroidism of renal origin: Secondary | ICD-10-CM | POA: Diagnosis not present

## 2018-07-30 DIAGNOSIS — E875 Hyperkalemia: Secondary | ICD-10-CM | POA: Diagnosis not present

## 2018-07-30 DIAGNOSIS — N2581 Secondary hyperparathyroidism of renal origin: Secondary | ICD-10-CM | POA: Diagnosis not present

## 2018-07-30 DIAGNOSIS — D509 Iron deficiency anemia, unspecified: Secondary | ICD-10-CM | POA: Diagnosis not present

## 2018-07-30 DIAGNOSIS — D631 Anemia in chronic kidney disease: Secondary | ICD-10-CM | POA: Diagnosis not present

## 2018-07-30 DIAGNOSIS — N186 End stage renal disease: Secondary | ICD-10-CM | POA: Diagnosis not present

## 2018-08-01 DIAGNOSIS — N2581 Secondary hyperparathyroidism of renal origin: Secondary | ICD-10-CM | POA: Diagnosis not present

## 2018-08-01 DIAGNOSIS — D631 Anemia in chronic kidney disease: Secondary | ICD-10-CM | POA: Diagnosis not present

## 2018-08-01 DIAGNOSIS — N186 End stage renal disease: Secondary | ICD-10-CM | POA: Diagnosis not present

## 2018-08-01 DIAGNOSIS — D509 Iron deficiency anemia, unspecified: Secondary | ICD-10-CM | POA: Diagnosis not present

## 2018-08-01 DIAGNOSIS — E875 Hyperkalemia: Secondary | ICD-10-CM | POA: Diagnosis not present

## 2018-08-03 DIAGNOSIS — E875 Hyperkalemia: Secondary | ICD-10-CM | POA: Diagnosis not present

## 2018-08-03 DIAGNOSIS — N186 End stage renal disease: Secondary | ICD-10-CM | POA: Diagnosis not present

## 2018-08-03 DIAGNOSIS — D509 Iron deficiency anemia, unspecified: Secondary | ICD-10-CM | POA: Diagnosis not present

## 2018-08-03 DIAGNOSIS — D631 Anemia in chronic kidney disease: Secondary | ICD-10-CM | POA: Diagnosis not present

## 2018-08-03 DIAGNOSIS — N2581 Secondary hyperparathyroidism of renal origin: Secondary | ICD-10-CM | POA: Diagnosis not present

## 2018-08-06 DIAGNOSIS — E875 Hyperkalemia: Secondary | ICD-10-CM | POA: Diagnosis not present

## 2018-08-06 DIAGNOSIS — D509 Iron deficiency anemia, unspecified: Secondary | ICD-10-CM | POA: Diagnosis not present

## 2018-08-06 DIAGNOSIS — D631 Anemia in chronic kidney disease: Secondary | ICD-10-CM | POA: Diagnosis not present

## 2018-08-06 DIAGNOSIS — N186 End stage renal disease: Secondary | ICD-10-CM | POA: Diagnosis not present

## 2018-08-06 DIAGNOSIS — N2581 Secondary hyperparathyroidism of renal origin: Secondary | ICD-10-CM | POA: Diagnosis not present

## 2018-08-08 DIAGNOSIS — N186 End stage renal disease: Secondary | ICD-10-CM | POA: Diagnosis not present

## 2018-08-08 DIAGNOSIS — N2581 Secondary hyperparathyroidism of renal origin: Secondary | ICD-10-CM | POA: Diagnosis not present

## 2018-08-08 DIAGNOSIS — E875 Hyperkalemia: Secondary | ICD-10-CM | POA: Diagnosis not present

## 2018-08-08 DIAGNOSIS — D631 Anemia in chronic kidney disease: Secondary | ICD-10-CM | POA: Diagnosis not present

## 2018-08-08 DIAGNOSIS — D509 Iron deficiency anemia, unspecified: Secondary | ICD-10-CM | POA: Diagnosis not present

## 2018-08-10 DIAGNOSIS — N2581 Secondary hyperparathyroidism of renal origin: Secondary | ICD-10-CM | POA: Diagnosis not present

## 2018-08-10 DIAGNOSIS — N186 End stage renal disease: Secondary | ICD-10-CM | POA: Diagnosis not present

## 2018-08-10 DIAGNOSIS — D509 Iron deficiency anemia, unspecified: Secondary | ICD-10-CM | POA: Diagnosis not present

## 2018-08-10 DIAGNOSIS — D631 Anemia in chronic kidney disease: Secondary | ICD-10-CM | POA: Diagnosis not present

## 2018-08-10 DIAGNOSIS — E875 Hyperkalemia: Secondary | ICD-10-CM | POA: Diagnosis not present

## 2018-08-13 DIAGNOSIS — E875 Hyperkalemia: Secondary | ICD-10-CM | POA: Diagnosis not present

## 2018-08-13 DIAGNOSIS — D631 Anemia in chronic kidney disease: Secondary | ICD-10-CM | POA: Diagnosis not present

## 2018-08-13 DIAGNOSIS — D509 Iron deficiency anemia, unspecified: Secondary | ICD-10-CM | POA: Diagnosis not present

## 2018-08-13 DIAGNOSIS — N2581 Secondary hyperparathyroidism of renal origin: Secondary | ICD-10-CM | POA: Diagnosis not present

## 2018-08-13 DIAGNOSIS — N186 End stage renal disease: Secondary | ICD-10-CM | POA: Diagnosis not present

## 2018-08-14 DIAGNOSIS — N186 End stage renal disease: Secondary | ICD-10-CM | POA: Diagnosis not present

## 2018-08-14 DIAGNOSIS — N2581 Secondary hyperparathyroidism of renal origin: Secondary | ICD-10-CM | POA: Diagnosis not present

## 2018-08-14 DIAGNOSIS — E8770 Fluid overload, unspecified: Secondary | ICD-10-CM | POA: Diagnosis not present

## 2018-08-15 DIAGNOSIS — D509 Iron deficiency anemia, unspecified: Secondary | ICD-10-CM | POA: Diagnosis not present

## 2018-08-15 DIAGNOSIS — D649 Anemia, unspecified: Secondary | ICD-10-CM | POA: Diagnosis not present

## 2018-08-15 DIAGNOSIS — Z992 Dependence on renal dialysis: Secondary | ICD-10-CM | POA: Diagnosis not present

## 2018-08-15 DIAGNOSIS — I10 Essential (primary) hypertension: Secondary | ICD-10-CM | POA: Diagnosis not present

## 2018-08-15 DIAGNOSIS — D631 Anemia in chronic kidney disease: Secondary | ICD-10-CM | POA: Diagnosis not present

## 2018-08-15 DIAGNOSIS — E875 Hyperkalemia: Secondary | ICD-10-CM | POA: Diagnosis not present

## 2018-08-15 DIAGNOSIS — N2581 Secondary hyperparathyroidism of renal origin: Secondary | ICD-10-CM | POA: Diagnosis not present

## 2018-08-15 DIAGNOSIS — N186 End stage renal disease: Secondary | ICD-10-CM | POA: Diagnosis not present

## 2018-08-20 DIAGNOSIS — N2581 Secondary hyperparathyroidism of renal origin: Secondary | ICD-10-CM | POA: Diagnosis not present

## 2018-08-20 DIAGNOSIS — E875 Hyperkalemia: Secondary | ICD-10-CM | POA: Diagnosis not present

## 2018-08-20 DIAGNOSIS — D631 Anemia in chronic kidney disease: Secondary | ICD-10-CM | POA: Diagnosis not present

## 2018-08-20 DIAGNOSIS — D509 Iron deficiency anemia, unspecified: Secondary | ICD-10-CM | POA: Diagnosis not present

## 2018-08-20 DIAGNOSIS — N186 End stage renal disease: Secondary | ICD-10-CM | POA: Diagnosis not present

## 2018-08-20 DIAGNOSIS — D649 Anemia, unspecified: Secondary | ICD-10-CM | POA: Diagnosis not present

## 2018-08-22 DIAGNOSIS — E875 Hyperkalemia: Secondary | ICD-10-CM | POA: Diagnosis not present

## 2018-08-22 DIAGNOSIS — N186 End stage renal disease: Secondary | ICD-10-CM | POA: Diagnosis not present

## 2018-08-22 DIAGNOSIS — D649 Anemia, unspecified: Secondary | ICD-10-CM | POA: Diagnosis not present

## 2018-08-22 DIAGNOSIS — D631 Anemia in chronic kidney disease: Secondary | ICD-10-CM | POA: Diagnosis not present

## 2018-08-22 DIAGNOSIS — N2581 Secondary hyperparathyroidism of renal origin: Secondary | ICD-10-CM | POA: Diagnosis not present

## 2018-08-22 DIAGNOSIS — D509 Iron deficiency anemia, unspecified: Secondary | ICD-10-CM | POA: Diagnosis not present

## 2018-08-24 DIAGNOSIS — D631 Anemia in chronic kidney disease: Secondary | ICD-10-CM | POA: Diagnosis not present

## 2018-08-24 DIAGNOSIS — E875 Hyperkalemia: Secondary | ICD-10-CM | POA: Diagnosis not present

## 2018-08-24 DIAGNOSIS — D509 Iron deficiency anemia, unspecified: Secondary | ICD-10-CM | POA: Diagnosis not present

## 2018-08-24 DIAGNOSIS — N2581 Secondary hyperparathyroidism of renal origin: Secondary | ICD-10-CM | POA: Diagnosis not present

## 2018-08-24 DIAGNOSIS — D649 Anemia, unspecified: Secondary | ICD-10-CM | POA: Diagnosis not present

## 2018-08-24 DIAGNOSIS — N186 End stage renal disease: Secondary | ICD-10-CM | POA: Diagnosis not present

## 2018-08-27 DIAGNOSIS — E875 Hyperkalemia: Secondary | ICD-10-CM | POA: Diagnosis not present

## 2018-08-27 DIAGNOSIS — N2581 Secondary hyperparathyroidism of renal origin: Secondary | ICD-10-CM | POA: Diagnosis not present

## 2018-08-27 DIAGNOSIS — D631 Anemia in chronic kidney disease: Secondary | ICD-10-CM | POA: Diagnosis not present

## 2018-08-27 DIAGNOSIS — N186 End stage renal disease: Secondary | ICD-10-CM | POA: Diagnosis not present

## 2018-08-27 DIAGNOSIS — D649 Anemia, unspecified: Secondary | ICD-10-CM | POA: Diagnosis not present

## 2018-08-27 DIAGNOSIS — D509 Iron deficiency anemia, unspecified: Secondary | ICD-10-CM | POA: Diagnosis not present

## 2018-08-29 DIAGNOSIS — D649 Anemia, unspecified: Secondary | ICD-10-CM | POA: Diagnosis not present

## 2018-08-29 DIAGNOSIS — N2581 Secondary hyperparathyroidism of renal origin: Secondary | ICD-10-CM | POA: Diagnosis not present

## 2018-08-29 DIAGNOSIS — N186 End stage renal disease: Secondary | ICD-10-CM | POA: Diagnosis not present

## 2018-08-29 DIAGNOSIS — E875 Hyperkalemia: Secondary | ICD-10-CM | POA: Diagnosis not present

## 2018-08-29 DIAGNOSIS — D509 Iron deficiency anemia, unspecified: Secondary | ICD-10-CM | POA: Diagnosis not present

## 2018-08-29 DIAGNOSIS — D631 Anemia in chronic kidney disease: Secondary | ICD-10-CM | POA: Diagnosis not present

## 2018-08-31 DIAGNOSIS — N186 End stage renal disease: Secondary | ICD-10-CM | POA: Diagnosis not present

## 2018-08-31 DIAGNOSIS — D649 Anemia, unspecified: Secondary | ICD-10-CM | POA: Diagnosis not present

## 2018-08-31 DIAGNOSIS — E875 Hyperkalemia: Secondary | ICD-10-CM | POA: Diagnosis not present

## 2018-08-31 DIAGNOSIS — D631 Anemia in chronic kidney disease: Secondary | ICD-10-CM | POA: Diagnosis not present

## 2018-08-31 DIAGNOSIS — D509 Iron deficiency anemia, unspecified: Secondary | ICD-10-CM | POA: Diagnosis not present

## 2018-08-31 DIAGNOSIS — N2581 Secondary hyperparathyroidism of renal origin: Secondary | ICD-10-CM | POA: Diagnosis not present

## 2018-09-03 DIAGNOSIS — E875 Hyperkalemia: Secondary | ICD-10-CM | POA: Diagnosis not present

## 2018-09-03 DIAGNOSIS — D509 Iron deficiency anemia, unspecified: Secondary | ICD-10-CM | POA: Diagnosis not present

## 2018-09-03 DIAGNOSIS — D649 Anemia, unspecified: Secondary | ICD-10-CM | POA: Diagnosis not present

## 2018-09-03 DIAGNOSIS — N186 End stage renal disease: Secondary | ICD-10-CM | POA: Diagnosis not present

## 2018-09-03 DIAGNOSIS — N2581 Secondary hyperparathyroidism of renal origin: Secondary | ICD-10-CM | POA: Diagnosis not present

## 2018-09-03 DIAGNOSIS — D631 Anemia in chronic kidney disease: Secondary | ICD-10-CM | POA: Diagnosis not present

## 2018-09-05 DIAGNOSIS — D649 Anemia, unspecified: Secondary | ICD-10-CM | POA: Diagnosis not present

## 2018-09-05 DIAGNOSIS — E875 Hyperkalemia: Secondary | ICD-10-CM | POA: Diagnosis not present

## 2018-09-05 DIAGNOSIS — N2581 Secondary hyperparathyroidism of renal origin: Secondary | ICD-10-CM | POA: Diagnosis not present

## 2018-09-05 DIAGNOSIS — D509 Iron deficiency anemia, unspecified: Secondary | ICD-10-CM | POA: Diagnosis not present

## 2018-09-05 DIAGNOSIS — D631 Anemia in chronic kidney disease: Secondary | ICD-10-CM | POA: Diagnosis not present

## 2018-09-05 DIAGNOSIS — N186 End stage renal disease: Secondary | ICD-10-CM | POA: Diagnosis not present

## 2018-09-07 DIAGNOSIS — E875 Hyperkalemia: Secondary | ICD-10-CM | POA: Diagnosis not present

## 2018-09-07 DIAGNOSIS — N2581 Secondary hyperparathyroidism of renal origin: Secondary | ICD-10-CM | POA: Diagnosis not present

## 2018-09-07 DIAGNOSIS — N186 End stage renal disease: Secondary | ICD-10-CM | POA: Diagnosis not present

## 2018-09-07 DIAGNOSIS — D509 Iron deficiency anemia, unspecified: Secondary | ICD-10-CM | POA: Diagnosis not present

## 2018-09-07 DIAGNOSIS — D649 Anemia, unspecified: Secondary | ICD-10-CM | POA: Diagnosis not present

## 2018-09-07 DIAGNOSIS — D631 Anemia in chronic kidney disease: Secondary | ICD-10-CM | POA: Diagnosis not present

## 2018-09-11 DIAGNOSIS — N186 End stage renal disease: Secondary | ICD-10-CM | POA: Diagnosis not present

## 2018-09-11 DIAGNOSIS — D649 Anemia, unspecified: Secondary | ICD-10-CM | POA: Diagnosis not present

## 2018-09-11 DIAGNOSIS — D631 Anemia in chronic kidney disease: Secondary | ICD-10-CM | POA: Diagnosis not present

## 2018-09-11 DIAGNOSIS — D509 Iron deficiency anemia, unspecified: Secondary | ICD-10-CM | POA: Diagnosis not present

## 2018-09-11 DIAGNOSIS — E875 Hyperkalemia: Secondary | ICD-10-CM | POA: Diagnosis not present

## 2018-09-11 DIAGNOSIS — N2581 Secondary hyperparathyroidism of renal origin: Secondary | ICD-10-CM | POA: Diagnosis not present

## 2018-09-12 DIAGNOSIS — E875 Hyperkalemia: Secondary | ICD-10-CM | POA: Diagnosis not present

## 2018-09-12 DIAGNOSIS — D509 Iron deficiency anemia, unspecified: Secondary | ICD-10-CM | POA: Diagnosis not present

## 2018-09-12 DIAGNOSIS — N186 End stage renal disease: Secondary | ICD-10-CM | POA: Diagnosis not present

## 2018-09-12 DIAGNOSIS — D649 Anemia, unspecified: Secondary | ICD-10-CM | POA: Diagnosis not present

## 2018-09-12 DIAGNOSIS — N2581 Secondary hyperparathyroidism of renal origin: Secondary | ICD-10-CM | POA: Diagnosis not present

## 2018-09-12 DIAGNOSIS — D631 Anemia in chronic kidney disease: Secondary | ICD-10-CM | POA: Diagnosis not present

## 2018-09-14 DIAGNOSIS — I10 Essential (primary) hypertension: Secondary | ICD-10-CM | POA: Diagnosis not present

## 2018-09-14 DIAGNOSIS — N186 End stage renal disease: Secondary | ICD-10-CM | POA: Diagnosis not present

## 2018-09-14 DIAGNOSIS — D509 Iron deficiency anemia, unspecified: Secondary | ICD-10-CM | POA: Diagnosis not present

## 2018-09-14 DIAGNOSIS — E875 Hyperkalemia: Secondary | ICD-10-CM | POA: Diagnosis not present

## 2018-09-14 DIAGNOSIS — N2581 Secondary hyperparathyroidism of renal origin: Secondary | ICD-10-CM | POA: Diagnosis not present

## 2018-09-14 DIAGNOSIS — Z992 Dependence on renal dialysis: Secondary | ICD-10-CM | POA: Diagnosis not present

## 2018-09-14 DIAGNOSIS — D631 Anemia in chronic kidney disease: Secondary | ICD-10-CM | POA: Diagnosis not present

## 2018-09-17 DIAGNOSIS — N2581 Secondary hyperparathyroidism of renal origin: Secondary | ICD-10-CM | POA: Diagnosis not present

## 2018-09-17 DIAGNOSIS — D631 Anemia in chronic kidney disease: Secondary | ICD-10-CM | POA: Diagnosis not present

## 2018-09-17 DIAGNOSIS — D509 Iron deficiency anemia, unspecified: Secondary | ICD-10-CM | POA: Diagnosis not present

## 2018-09-17 DIAGNOSIS — N186 End stage renal disease: Secondary | ICD-10-CM | POA: Diagnosis not present

## 2018-09-17 DIAGNOSIS — E875 Hyperkalemia: Secondary | ICD-10-CM | POA: Diagnosis not present

## 2018-09-19 DIAGNOSIS — D509 Iron deficiency anemia, unspecified: Secondary | ICD-10-CM | POA: Diagnosis not present

## 2018-09-19 DIAGNOSIS — D631 Anemia in chronic kidney disease: Secondary | ICD-10-CM | POA: Diagnosis not present

## 2018-09-19 DIAGNOSIS — E875 Hyperkalemia: Secondary | ICD-10-CM | POA: Diagnosis not present

## 2018-09-19 DIAGNOSIS — N186 End stage renal disease: Secondary | ICD-10-CM | POA: Diagnosis not present

## 2018-09-19 DIAGNOSIS — N2581 Secondary hyperparathyroidism of renal origin: Secondary | ICD-10-CM | POA: Diagnosis not present

## 2018-09-21 DIAGNOSIS — N186 End stage renal disease: Secondary | ICD-10-CM | POA: Diagnosis not present

## 2018-09-21 DIAGNOSIS — D509 Iron deficiency anemia, unspecified: Secondary | ICD-10-CM | POA: Diagnosis not present

## 2018-09-21 DIAGNOSIS — N2581 Secondary hyperparathyroidism of renal origin: Secondary | ICD-10-CM | POA: Diagnosis not present

## 2018-09-21 DIAGNOSIS — E875 Hyperkalemia: Secondary | ICD-10-CM | POA: Diagnosis not present

## 2018-09-21 DIAGNOSIS — D631 Anemia in chronic kidney disease: Secondary | ICD-10-CM | POA: Diagnosis not present

## 2018-09-24 DIAGNOSIS — N2581 Secondary hyperparathyroidism of renal origin: Secondary | ICD-10-CM | POA: Diagnosis not present

## 2018-09-24 DIAGNOSIS — E875 Hyperkalemia: Secondary | ICD-10-CM | POA: Diagnosis not present

## 2018-09-24 DIAGNOSIS — D631 Anemia in chronic kidney disease: Secondary | ICD-10-CM | POA: Diagnosis not present

## 2018-09-24 DIAGNOSIS — N186 End stage renal disease: Secondary | ICD-10-CM | POA: Diagnosis not present

## 2018-09-24 DIAGNOSIS — D509 Iron deficiency anemia, unspecified: Secondary | ICD-10-CM | POA: Diagnosis not present

## 2018-09-26 DIAGNOSIS — D509 Iron deficiency anemia, unspecified: Secondary | ICD-10-CM | POA: Diagnosis not present

## 2018-09-26 DIAGNOSIS — E875 Hyperkalemia: Secondary | ICD-10-CM | POA: Diagnosis not present

## 2018-09-26 DIAGNOSIS — N186 End stage renal disease: Secondary | ICD-10-CM | POA: Diagnosis not present

## 2018-09-26 DIAGNOSIS — N2581 Secondary hyperparathyroidism of renal origin: Secondary | ICD-10-CM | POA: Diagnosis not present

## 2018-09-26 DIAGNOSIS — D631 Anemia in chronic kidney disease: Secondary | ICD-10-CM | POA: Diagnosis not present

## 2018-09-28 DIAGNOSIS — D509 Iron deficiency anemia, unspecified: Secondary | ICD-10-CM | POA: Diagnosis not present

## 2018-09-28 DIAGNOSIS — E875 Hyperkalemia: Secondary | ICD-10-CM | POA: Diagnosis not present

## 2018-09-28 DIAGNOSIS — N186 End stage renal disease: Secondary | ICD-10-CM | POA: Diagnosis not present

## 2018-09-28 DIAGNOSIS — N2581 Secondary hyperparathyroidism of renal origin: Secondary | ICD-10-CM | POA: Diagnosis not present

## 2018-09-28 DIAGNOSIS — D631 Anemia in chronic kidney disease: Secondary | ICD-10-CM | POA: Diagnosis not present

## 2018-10-01 DIAGNOSIS — N186 End stage renal disease: Secondary | ICD-10-CM | POA: Diagnosis not present

## 2018-10-01 DIAGNOSIS — D631 Anemia in chronic kidney disease: Secondary | ICD-10-CM | POA: Diagnosis not present

## 2018-10-01 DIAGNOSIS — D509 Iron deficiency anemia, unspecified: Secondary | ICD-10-CM | POA: Diagnosis not present

## 2018-10-01 DIAGNOSIS — N2581 Secondary hyperparathyroidism of renal origin: Secondary | ICD-10-CM | POA: Diagnosis not present

## 2018-10-01 DIAGNOSIS — E875 Hyperkalemia: Secondary | ICD-10-CM | POA: Diagnosis not present

## 2018-10-03 DIAGNOSIS — D631 Anemia in chronic kidney disease: Secondary | ICD-10-CM | POA: Diagnosis not present

## 2018-10-03 DIAGNOSIS — N2581 Secondary hyperparathyroidism of renal origin: Secondary | ICD-10-CM | POA: Diagnosis not present

## 2018-10-03 DIAGNOSIS — D509 Iron deficiency anemia, unspecified: Secondary | ICD-10-CM | POA: Diagnosis not present

## 2018-10-03 DIAGNOSIS — N186 End stage renal disease: Secondary | ICD-10-CM | POA: Diagnosis not present

## 2018-10-03 DIAGNOSIS — E875 Hyperkalemia: Secondary | ICD-10-CM | POA: Diagnosis not present

## 2018-10-05 DIAGNOSIS — E875 Hyperkalemia: Secondary | ICD-10-CM | POA: Diagnosis not present

## 2018-10-05 DIAGNOSIS — D509 Iron deficiency anemia, unspecified: Secondary | ICD-10-CM | POA: Diagnosis not present

## 2018-10-05 DIAGNOSIS — N186 End stage renal disease: Secondary | ICD-10-CM | POA: Diagnosis not present

## 2018-10-05 DIAGNOSIS — D631 Anemia in chronic kidney disease: Secondary | ICD-10-CM | POA: Diagnosis not present

## 2018-10-05 DIAGNOSIS — N2581 Secondary hyperparathyroidism of renal origin: Secondary | ICD-10-CM | POA: Diagnosis not present

## 2018-10-08 DIAGNOSIS — N2581 Secondary hyperparathyroidism of renal origin: Secondary | ICD-10-CM | POA: Diagnosis not present

## 2018-10-08 DIAGNOSIS — D509 Iron deficiency anemia, unspecified: Secondary | ICD-10-CM | POA: Diagnosis not present

## 2018-10-08 DIAGNOSIS — N186 End stage renal disease: Secondary | ICD-10-CM | POA: Diagnosis not present

## 2018-10-08 DIAGNOSIS — E875 Hyperkalemia: Secondary | ICD-10-CM | POA: Diagnosis not present

## 2018-10-08 DIAGNOSIS — D631 Anemia in chronic kidney disease: Secondary | ICD-10-CM | POA: Diagnosis not present

## 2018-10-10 DIAGNOSIS — E875 Hyperkalemia: Secondary | ICD-10-CM | POA: Diagnosis not present

## 2018-10-10 DIAGNOSIS — N2581 Secondary hyperparathyroidism of renal origin: Secondary | ICD-10-CM | POA: Diagnosis not present

## 2018-10-10 DIAGNOSIS — N186 End stage renal disease: Secondary | ICD-10-CM | POA: Diagnosis not present

## 2018-10-10 DIAGNOSIS — D509 Iron deficiency anemia, unspecified: Secondary | ICD-10-CM | POA: Diagnosis not present

## 2018-10-10 DIAGNOSIS — D631 Anemia in chronic kidney disease: Secondary | ICD-10-CM | POA: Diagnosis not present

## 2018-10-12 DIAGNOSIS — D509 Iron deficiency anemia, unspecified: Secondary | ICD-10-CM | POA: Diagnosis not present

## 2018-10-12 DIAGNOSIS — E875 Hyperkalemia: Secondary | ICD-10-CM | POA: Diagnosis not present

## 2018-10-12 DIAGNOSIS — N2581 Secondary hyperparathyroidism of renal origin: Secondary | ICD-10-CM | POA: Diagnosis not present

## 2018-10-12 DIAGNOSIS — D631 Anemia in chronic kidney disease: Secondary | ICD-10-CM | POA: Diagnosis not present

## 2018-10-12 DIAGNOSIS — N186 End stage renal disease: Secondary | ICD-10-CM | POA: Diagnosis not present

## 2018-10-15 DIAGNOSIS — I10 Essential (primary) hypertension: Secondary | ICD-10-CM | POA: Diagnosis not present

## 2018-10-15 DIAGNOSIS — Z992 Dependence on renal dialysis: Secondary | ICD-10-CM | POA: Diagnosis not present

## 2018-10-15 DIAGNOSIS — N186 End stage renal disease: Secondary | ICD-10-CM | POA: Diagnosis not present

## 2018-10-15 DIAGNOSIS — E875 Hyperkalemia: Secondary | ICD-10-CM | POA: Diagnosis not present

## 2018-10-15 DIAGNOSIS — N2581 Secondary hyperparathyroidism of renal origin: Secondary | ICD-10-CM | POA: Diagnosis not present

## 2018-10-15 DIAGNOSIS — D509 Iron deficiency anemia, unspecified: Secondary | ICD-10-CM | POA: Diagnosis not present

## 2018-10-15 DIAGNOSIS — D631 Anemia in chronic kidney disease: Secondary | ICD-10-CM | POA: Diagnosis not present

## 2018-10-17 DIAGNOSIS — D631 Anemia in chronic kidney disease: Secondary | ICD-10-CM | POA: Diagnosis not present

## 2018-10-17 DIAGNOSIS — N2581 Secondary hyperparathyroidism of renal origin: Secondary | ICD-10-CM | POA: Diagnosis not present

## 2018-10-17 DIAGNOSIS — D509 Iron deficiency anemia, unspecified: Secondary | ICD-10-CM | POA: Diagnosis not present

## 2018-10-17 DIAGNOSIS — N186 End stage renal disease: Secondary | ICD-10-CM | POA: Diagnosis not present

## 2018-10-17 DIAGNOSIS — E875 Hyperkalemia: Secondary | ICD-10-CM | POA: Diagnosis not present

## 2018-10-19 DIAGNOSIS — E875 Hyperkalemia: Secondary | ICD-10-CM | POA: Diagnosis not present

## 2018-10-19 DIAGNOSIS — N2581 Secondary hyperparathyroidism of renal origin: Secondary | ICD-10-CM | POA: Diagnosis not present

## 2018-10-19 DIAGNOSIS — D631 Anemia in chronic kidney disease: Secondary | ICD-10-CM | POA: Diagnosis not present

## 2018-10-19 DIAGNOSIS — N186 End stage renal disease: Secondary | ICD-10-CM | POA: Diagnosis not present

## 2018-10-19 DIAGNOSIS — D509 Iron deficiency anemia, unspecified: Secondary | ICD-10-CM | POA: Diagnosis not present

## 2018-10-22 DIAGNOSIS — N186 End stage renal disease: Secondary | ICD-10-CM | POA: Diagnosis not present

## 2018-10-22 DIAGNOSIS — D631 Anemia in chronic kidney disease: Secondary | ICD-10-CM | POA: Diagnosis not present

## 2018-10-22 DIAGNOSIS — N2581 Secondary hyperparathyroidism of renal origin: Secondary | ICD-10-CM | POA: Diagnosis not present

## 2018-10-22 DIAGNOSIS — E875 Hyperkalemia: Secondary | ICD-10-CM | POA: Diagnosis not present

## 2018-10-22 DIAGNOSIS — D509 Iron deficiency anemia, unspecified: Secondary | ICD-10-CM | POA: Diagnosis not present

## 2018-10-24 DIAGNOSIS — E875 Hyperkalemia: Secondary | ICD-10-CM | POA: Diagnosis not present

## 2018-10-24 DIAGNOSIS — D509 Iron deficiency anemia, unspecified: Secondary | ICD-10-CM | POA: Diagnosis not present

## 2018-10-24 DIAGNOSIS — N2581 Secondary hyperparathyroidism of renal origin: Secondary | ICD-10-CM | POA: Diagnosis not present

## 2018-10-24 DIAGNOSIS — D631 Anemia in chronic kidney disease: Secondary | ICD-10-CM | POA: Diagnosis not present

## 2018-10-24 DIAGNOSIS — N186 End stage renal disease: Secondary | ICD-10-CM | POA: Diagnosis not present

## 2018-10-26 DIAGNOSIS — N2581 Secondary hyperparathyroidism of renal origin: Secondary | ICD-10-CM | POA: Diagnosis not present

## 2018-10-26 DIAGNOSIS — D509 Iron deficiency anemia, unspecified: Secondary | ICD-10-CM | POA: Diagnosis not present

## 2018-10-26 DIAGNOSIS — E875 Hyperkalemia: Secondary | ICD-10-CM | POA: Diagnosis not present

## 2018-10-26 DIAGNOSIS — N186 End stage renal disease: Secondary | ICD-10-CM | POA: Diagnosis not present

## 2018-10-26 DIAGNOSIS — D631 Anemia in chronic kidney disease: Secondary | ICD-10-CM | POA: Diagnosis not present

## 2018-10-29 DIAGNOSIS — N186 End stage renal disease: Secondary | ICD-10-CM | POA: Diagnosis not present

## 2018-10-29 DIAGNOSIS — D509 Iron deficiency anemia, unspecified: Secondary | ICD-10-CM | POA: Diagnosis not present

## 2018-10-29 DIAGNOSIS — N2581 Secondary hyperparathyroidism of renal origin: Secondary | ICD-10-CM | POA: Diagnosis not present

## 2018-10-29 DIAGNOSIS — D631 Anemia in chronic kidney disease: Secondary | ICD-10-CM | POA: Diagnosis not present

## 2018-10-29 DIAGNOSIS — E875 Hyperkalemia: Secondary | ICD-10-CM | POA: Diagnosis not present

## 2018-10-31 DIAGNOSIS — D509 Iron deficiency anemia, unspecified: Secondary | ICD-10-CM | POA: Diagnosis not present

## 2018-10-31 DIAGNOSIS — D631 Anemia in chronic kidney disease: Secondary | ICD-10-CM | POA: Diagnosis not present

## 2018-10-31 DIAGNOSIS — E875 Hyperkalemia: Secondary | ICD-10-CM | POA: Diagnosis not present

## 2018-10-31 DIAGNOSIS — N186 End stage renal disease: Secondary | ICD-10-CM | POA: Diagnosis not present

## 2018-10-31 DIAGNOSIS — N2581 Secondary hyperparathyroidism of renal origin: Secondary | ICD-10-CM | POA: Diagnosis not present

## 2018-11-02 DIAGNOSIS — N2581 Secondary hyperparathyroidism of renal origin: Secondary | ICD-10-CM | POA: Diagnosis not present

## 2018-11-02 DIAGNOSIS — D631 Anemia in chronic kidney disease: Secondary | ICD-10-CM | POA: Diagnosis not present

## 2018-11-02 DIAGNOSIS — N186 End stage renal disease: Secondary | ICD-10-CM | POA: Diagnosis not present

## 2018-11-02 DIAGNOSIS — E875 Hyperkalemia: Secondary | ICD-10-CM | POA: Diagnosis not present

## 2018-11-02 DIAGNOSIS — D509 Iron deficiency anemia, unspecified: Secondary | ICD-10-CM | POA: Diagnosis not present

## 2018-11-05 DIAGNOSIS — D631 Anemia in chronic kidney disease: Secondary | ICD-10-CM | POA: Diagnosis not present

## 2018-11-05 DIAGNOSIS — D509 Iron deficiency anemia, unspecified: Secondary | ICD-10-CM | POA: Diagnosis not present

## 2018-11-05 DIAGNOSIS — N2581 Secondary hyperparathyroidism of renal origin: Secondary | ICD-10-CM | POA: Diagnosis not present

## 2018-11-05 DIAGNOSIS — N186 End stage renal disease: Secondary | ICD-10-CM | POA: Diagnosis not present

## 2018-11-05 DIAGNOSIS — E875 Hyperkalemia: Secondary | ICD-10-CM | POA: Diagnosis not present

## 2018-11-07 DIAGNOSIS — N186 End stage renal disease: Secondary | ICD-10-CM | POA: Diagnosis not present

## 2018-11-07 DIAGNOSIS — D509 Iron deficiency anemia, unspecified: Secondary | ICD-10-CM | POA: Diagnosis not present

## 2018-11-07 DIAGNOSIS — N2581 Secondary hyperparathyroidism of renal origin: Secondary | ICD-10-CM | POA: Diagnosis not present

## 2018-11-07 DIAGNOSIS — E875 Hyperkalemia: Secondary | ICD-10-CM | POA: Diagnosis not present

## 2018-11-07 DIAGNOSIS — D631 Anemia in chronic kidney disease: Secondary | ICD-10-CM | POA: Diagnosis not present

## 2018-11-09 DIAGNOSIS — D509 Iron deficiency anemia, unspecified: Secondary | ICD-10-CM | POA: Diagnosis not present

## 2018-11-09 DIAGNOSIS — N2581 Secondary hyperparathyroidism of renal origin: Secondary | ICD-10-CM | POA: Diagnosis not present

## 2018-11-09 DIAGNOSIS — E875 Hyperkalemia: Secondary | ICD-10-CM | POA: Diagnosis not present

## 2018-11-09 DIAGNOSIS — D631 Anemia in chronic kidney disease: Secondary | ICD-10-CM | POA: Diagnosis not present

## 2018-11-09 DIAGNOSIS — N186 End stage renal disease: Secondary | ICD-10-CM | POA: Diagnosis not present

## 2018-11-12 DIAGNOSIS — N186 End stage renal disease: Secondary | ICD-10-CM | POA: Diagnosis not present

## 2018-11-12 DIAGNOSIS — D509 Iron deficiency anemia, unspecified: Secondary | ICD-10-CM | POA: Diagnosis not present

## 2018-11-12 DIAGNOSIS — D631 Anemia in chronic kidney disease: Secondary | ICD-10-CM | POA: Diagnosis not present

## 2018-11-12 DIAGNOSIS — N2581 Secondary hyperparathyroidism of renal origin: Secondary | ICD-10-CM | POA: Diagnosis not present

## 2018-11-12 DIAGNOSIS — E875 Hyperkalemia: Secondary | ICD-10-CM | POA: Diagnosis not present

## 2018-11-14 DIAGNOSIS — D509 Iron deficiency anemia, unspecified: Secondary | ICD-10-CM | POA: Diagnosis not present

## 2018-11-14 DIAGNOSIS — Z992 Dependence on renal dialysis: Secondary | ICD-10-CM | POA: Diagnosis not present

## 2018-11-14 DIAGNOSIS — N186 End stage renal disease: Secondary | ICD-10-CM | POA: Diagnosis not present

## 2018-11-14 DIAGNOSIS — E875 Hyperkalemia: Secondary | ICD-10-CM | POA: Diagnosis not present

## 2018-11-14 DIAGNOSIS — D631 Anemia in chronic kidney disease: Secondary | ICD-10-CM | POA: Diagnosis not present

## 2018-11-14 DIAGNOSIS — N2581 Secondary hyperparathyroidism of renal origin: Secondary | ICD-10-CM | POA: Diagnosis not present

## 2018-11-14 DIAGNOSIS — I10 Essential (primary) hypertension: Secondary | ICD-10-CM | POA: Diagnosis not present

## 2018-11-17 DIAGNOSIS — E875 Hyperkalemia: Secondary | ICD-10-CM | POA: Diagnosis not present

## 2018-11-17 DIAGNOSIS — D631 Anemia in chronic kidney disease: Secondary | ICD-10-CM | POA: Diagnosis not present

## 2018-11-17 DIAGNOSIS — N2581 Secondary hyperparathyroidism of renal origin: Secondary | ICD-10-CM | POA: Diagnosis not present

## 2018-11-17 DIAGNOSIS — D509 Iron deficiency anemia, unspecified: Secondary | ICD-10-CM | POA: Diagnosis not present

## 2018-11-17 DIAGNOSIS — N186 End stage renal disease: Secondary | ICD-10-CM | POA: Diagnosis not present

## 2018-11-19 DIAGNOSIS — D509 Iron deficiency anemia, unspecified: Secondary | ICD-10-CM | POA: Diagnosis not present

## 2018-11-19 DIAGNOSIS — N186 End stage renal disease: Secondary | ICD-10-CM | POA: Diagnosis not present

## 2018-11-19 DIAGNOSIS — N2581 Secondary hyperparathyroidism of renal origin: Secondary | ICD-10-CM | POA: Diagnosis not present

## 2018-11-19 DIAGNOSIS — D631 Anemia in chronic kidney disease: Secondary | ICD-10-CM | POA: Diagnosis not present

## 2018-11-19 DIAGNOSIS — E875 Hyperkalemia: Secondary | ICD-10-CM | POA: Diagnosis not present

## 2018-11-21 DIAGNOSIS — N2581 Secondary hyperparathyroidism of renal origin: Secondary | ICD-10-CM | POA: Diagnosis not present

## 2018-11-21 DIAGNOSIS — D509 Iron deficiency anemia, unspecified: Secondary | ICD-10-CM | POA: Diagnosis not present

## 2018-11-21 DIAGNOSIS — D631 Anemia in chronic kidney disease: Secondary | ICD-10-CM | POA: Diagnosis not present

## 2018-11-21 DIAGNOSIS — E875 Hyperkalemia: Secondary | ICD-10-CM | POA: Diagnosis not present

## 2018-11-21 DIAGNOSIS — N186 End stage renal disease: Secondary | ICD-10-CM | POA: Diagnosis not present

## 2018-11-23 DIAGNOSIS — N2581 Secondary hyperparathyroidism of renal origin: Secondary | ICD-10-CM | POA: Diagnosis not present

## 2018-11-23 DIAGNOSIS — D631 Anemia in chronic kidney disease: Secondary | ICD-10-CM | POA: Diagnosis not present

## 2018-11-23 DIAGNOSIS — N186 End stage renal disease: Secondary | ICD-10-CM | POA: Diagnosis not present

## 2018-11-23 DIAGNOSIS — E875 Hyperkalemia: Secondary | ICD-10-CM | POA: Diagnosis not present

## 2018-11-23 DIAGNOSIS — D509 Iron deficiency anemia, unspecified: Secondary | ICD-10-CM | POA: Diagnosis not present

## 2018-11-26 DIAGNOSIS — N186 End stage renal disease: Secondary | ICD-10-CM | POA: Diagnosis not present

## 2018-11-26 DIAGNOSIS — E875 Hyperkalemia: Secondary | ICD-10-CM | POA: Diagnosis not present

## 2018-11-26 DIAGNOSIS — N2581 Secondary hyperparathyroidism of renal origin: Secondary | ICD-10-CM | POA: Diagnosis not present

## 2018-11-26 DIAGNOSIS — D509 Iron deficiency anemia, unspecified: Secondary | ICD-10-CM | POA: Diagnosis not present

## 2018-11-26 DIAGNOSIS — D631 Anemia in chronic kidney disease: Secondary | ICD-10-CM | POA: Diagnosis not present

## 2018-11-28 DIAGNOSIS — N2581 Secondary hyperparathyroidism of renal origin: Secondary | ICD-10-CM | POA: Diagnosis not present

## 2018-11-28 DIAGNOSIS — D631 Anemia in chronic kidney disease: Secondary | ICD-10-CM | POA: Diagnosis not present

## 2018-11-28 DIAGNOSIS — E875 Hyperkalemia: Secondary | ICD-10-CM | POA: Diagnosis not present

## 2018-11-28 DIAGNOSIS — N186 End stage renal disease: Secondary | ICD-10-CM | POA: Diagnosis not present

## 2018-11-28 DIAGNOSIS — D509 Iron deficiency anemia, unspecified: Secondary | ICD-10-CM | POA: Diagnosis not present

## 2018-11-30 DIAGNOSIS — D509 Iron deficiency anemia, unspecified: Secondary | ICD-10-CM | POA: Diagnosis not present

## 2018-11-30 DIAGNOSIS — D631 Anemia in chronic kidney disease: Secondary | ICD-10-CM | POA: Diagnosis not present

## 2018-11-30 DIAGNOSIS — N2581 Secondary hyperparathyroidism of renal origin: Secondary | ICD-10-CM | POA: Diagnosis not present

## 2018-11-30 DIAGNOSIS — E875 Hyperkalemia: Secondary | ICD-10-CM | POA: Diagnosis not present

## 2018-11-30 DIAGNOSIS — N186 End stage renal disease: Secondary | ICD-10-CM | POA: Diagnosis not present

## 2018-12-03 DIAGNOSIS — D509 Iron deficiency anemia, unspecified: Secondary | ICD-10-CM | POA: Diagnosis not present

## 2018-12-03 DIAGNOSIS — D631 Anemia in chronic kidney disease: Secondary | ICD-10-CM | POA: Diagnosis not present

## 2018-12-03 DIAGNOSIS — N186 End stage renal disease: Secondary | ICD-10-CM | POA: Diagnosis not present

## 2018-12-03 DIAGNOSIS — E875 Hyperkalemia: Secondary | ICD-10-CM | POA: Diagnosis not present

## 2018-12-03 DIAGNOSIS — N2581 Secondary hyperparathyroidism of renal origin: Secondary | ICD-10-CM | POA: Diagnosis not present

## 2018-12-05 DIAGNOSIS — N2581 Secondary hyperparathyroidism of renal origin: Secondary | ICD-10-CM | POA: Diagnosis not present

## 2018-12-05 DIAGNOSIS — D509 Iron deficiency anemia, unspecified: Secondary | ICD-10-CM | POA: Diagnosis not present

## 2018-12-05 DIAGNOSIS — N186 End stage renal disease: Secondary | ICD-10-CM | POA: Diagnosis not present

## 2018-12-05 DIAGNOSIS — D631 Anemia in chronic kidney disease: Secondary | ICD-10-CM | POA: Diagnosis not present

## 2018-12-05 DIAGNOSIS — E875 Hyperkalemia: Secondary | ICD-10-CM | POA: Diagnosis not present

## 2018-12-07 DIAGNOSIS — N2581 Secondary hyperparathyroidism of renal origin: Secondary | ICD-10-CM | POA: Diagnosis not present

## 2018-12-07 DIAGNOSIS — D631 Anemia in chronic kidney disease: Secondary | ICD-10-CM | POA: Diagnosis not present

## 2018-12-07 DIAGNOSIS — E875 Hyperkalemia: Secondary | ICD-10-CM | POA: Diagnosis not present

## 2018-12-07 DIAGNOSIS — N186 End stage renal disease: Secondary | ICD-10-CM | POA: Diagnosis not present

## 2018-12-07 DIAGNOSIS — D509 Iron deficiency anemia, unspecified: Secondary | ICD-10-CM | POA: Diagnosis not present

## 2018-12-10 DIAGNOSIS — N2581 Secondary hyperparathyroidism of renal origin: Secondary | ICD-10-CM | POA: Diagnosis not present

## 2018-12-10 DIAGNOSIS — E875 Hyperkalemia: Secondary | ICD-10-CM | POA: Diagnosis not present

## 2018-12-10 DIAGNOSIS — D509 Iron deficiency anemia, unspecified: Secondary | ICD-10-CM | POA: Diagnosis not present

## 2018-12-10 DIAGNOSIS — D631 Anemia in chronic kidney disease: Secondary | ICD-10-CM | POA: Diagnosis not present

## 2018-12-10 DIAGNOSIS — N186 End stage renal disease: Secondary | ICD-10-CM | POA: Diagnosis not present

## 2018-12-12 DIAGNOSIS — E875 Hyperkalemia: Secondary | ICD-10-CM | POA: Diagnosis not present

## 2018-12-12 DIAGNOSIS — D631 Anemia in chronic kidney disease: Secondary | ICD-10-CM | POA: Diagnosis not present

## 2018-12-12 DIAGNOSIS — N2581 Secondary hyperparathyroidism of renal origin: Secondary | ICD-10-CM | POA: Diagnosis not present

## 2018-12-12 DIAGNOSIS — N186 End stage renal disease: Secondary | ICD-10-CM | POA: Diagnosis not present

## 2018-12-12 DIAGNOSIS — D509 Iron deficiency anemia, unspecified: Secondary | ICD-10-CM | POA: Diagnosis not present

## 2018-12-14 DIAGNOSIS — N186 End stage renal disease: Secondary | ICD-10-CM | POA: Diagnosis not present

## 2018-12-14 DIAGNOSIS — D631 Anemia in chronic kidney disease: Secondary | ICD-10-CM | POA: Diagnosis not present

## 2018-12-14 DIAGNOSIS — D509 Iron deficiency anemia, unspecified: Secondary | ICD-10-CM | POA: Diagnosis not present

## 2018-12-14 DIAGNOSIS — N2581 Secondary hyperparathyroidism of renal origin: Secondary | ICD-10-CM | POA: Diagnosis not present

## 2018-12-14 DIAGNOSIS — E875 Hyperkalemia: Secondary | ICD-10-CM | POA: Diagnosis not present

## 2018-12-15 DIAGNOSIS — I10 Essential (primary) hypertension: Secondary | ICD-10-CM | POA: Diagnosis not present

## 2018-12-15 DIAGNOSIS — N186 End stage renal disease: Secondary | ICD-10-CM | POA: Diagnosis not present

## 2018-12-15 DIAGNOSIS — Z992 Dependence on renal dialysis: Secondary | ICD-10-CM | POA: Diagnosis not present

## 2018-12-17 DIAGNOSIS — N186 End stage renal disease: Secondary | ICD-10-CM | POA: Diagnosis not present

## 2018-12-17 DIAGNOSIS — Z992 Dependence on renal dialysis: Secondary | ICD-10-CM | POA: Diagnosis not present

## 2018-12-17 DIAGNOSIS — E875 Hyperkalemia: Secondary | ICD-10-CM | POA: Diagnosis not present

## 2018-12-17 DIAGNOSIS — D509 Iron deficiency anemia, unspecified: Secondary | ICD-10-CM | POA: Diagnosis not present

## 2018-12-17 DIAGNOSIS — D631 Anemia in chronic kidney disease: Secondary | ICD-10-CM | POA: Diagnosis not present

## 2018-12-17 DIAGNOSIS — N2581 Secondary hyperparathyroidism of renal origin: Secondary | ICD-10-CM | POA: Diagnosis not present

## 2018-12-19 DIAGNOSIS — D631 Anemia in chronic kidney disease: Secondary | ICD-10-CM | POA: Diagnosis not present

## 2018-12-19 DIAGNOSIS — E875 Hyperkalemia: Secondary | ICD-10-CM | POA: Diagnosis not present

## 2018-12-19 DIAGNOSIS — D509 Iron deficiency anemia, unspecified: Secondary | ICD-10-CM | POA: Diagnosis not present

## 2018-12-19 DIAGNOSIS — N186 End stage renal disease: Secondary | ICD-10-CM | POA: Diagnosis not present

## 2018-12-19 DIAGNOSIS — Z992 Dependence on renal dialysis: Secondary | ICD-10-CM | POA: Diagnosis not present

## 2018-12-19 DIAGNOSIS — N2581 Secondary hyperparathyroidism of renal origin: Secondary | ICD-10-CM | POA: Diagnosis not present

## 2018-12-21 DIAGNOSIS — D509 Iron deficiency anemia, unspecified: Secondary | ICD-10-CM | POA: Diagnosis not present

## 2018-12-21 DIAGNOSIS — Z992 Dependence on renal dialysis: Secondary | ICD-10-CM | POA: Diagnosis not present

## 2018-12-21 DIAGNOSIS — E875 Hyperkalemia: Secondary | ICD-10-CM | POA: Diagnosis not present

## 2018-12-21 DIAGNOSIS — N186 End stage renal disease: Secondary | ICD-10-CM | POA: Diagnosis not present

## 2018-12-21 DIAGNOSIS — D631 Anemia in chronic kidney disease: Secondary | ICD-10-CM | POA: Diagnosis not present

## 2018-12-21 DIAGNOSIS — N2581 Secondary hyperparathyroidism of renal origin: Secondary | ICD-10-CM | POA: Diagnosis not present

## 2018-12-24 DIAGNOSIS — N2581 Secondary hyperparathyroidism of renal origin: Secondary | ICD-10-CM | POA: Diagnosis not present

## 2018-12-24 DIAGNOSIS — E875 Hyperkalemia: Secondary | ICD-10-CM | POA: Diagnosis not present

## 2018-12-24 DIAGNOSIS — D631 Anemia in chronic kidney disease: Secondary | ICD-10-CM | POA: Diagnosis not present

## 2018-12-24 DIAGNOSIS — Z992 Dependence on renal dialysis: Secondary | ICD-10-CM | POA: Diagnosis not present

## 2018-12-24 DIAGNOSIS — D509 Iron deficiency anemia, unspecified: Secondary | ICD-10-CM | POA: Diagnosis not present

## 2018-12-24 DIAGNOSIS — N186 End stage renal disease: Secondary | ICD-10-CM | POA: Diagnosis not present

## 2018-12-26 DIAGNOSIS — D509 Iron deficiency anemia, unspecified: Secondary | ICD-10-CM | POA: Diagnosis not present

## 2018-12-26 DIAGNOSIS — E875 Hyperkalemia: Secondary | ICD-10-CM | POA: Diagnosis not present

## 2018-12-26 DIAGNOSIS — D631 Anemia in chronic kidney disease: Secondary | ICD-10-CM | POA: Diagnosis not present

## 2018-12-26 DIAGNOSIS — N2581 Secondary hyperparathyroidism of renal origin: Secondary | ICD-10-CM | POA: Diagnosis not present

## 2018-12-26 DIAGNOSIS — Z992 Dependence on renal dialysis: Secondary | ICD-10-CM | POA: Diagnosis not present

## 2018-12-26 DIAGNOSIS — N186 End stage renal disease: Secondary | ICD-10-CM | POA: Diagnosis not present

## 2018-12-28 DIAGNOSIS — N186 End stage renal disease: Secondary | ICD-10-CM | POA: Diagnosis not present

## 2018-12-28 DIAGNOSIS — N2581 Secondary hyperparathyroidism of renal origin: Secondary | ICD-10-CM | POA: Diagnosis not present

## 2018-12-28 DIAGNOSIS — D509 Iron deficiency anemia, unspecified: Secondary | ICD-10-CM | POA: Diagnosis not present

## 2018-12-28 DIAGNOSIS — Z992 Dependence on renal dialysis: Secondary | ICD-10-CM | POA: Diagnosis not present

## 2018-12-28 DIAGNOSIS — E875 Hyperkalemia: Secondary | ICD-10-CM | POA: Diagnosis not present

## 2018-12-28 DIAGNOSIS — D631 Anemia in chronic kidney disease: Secondary | ICD-10-CM | POA: Diagnosis not present

## 2018-12-31 DIAGNOSIS — E875 Hyperkalemia: Secondary | ICD-10-CM | POA: Diagnosis not present

## 2018-12-31 DIAGNOSIS — N2581 Secondary hyperparathyroidism of renal origin: Secondary | ICD-10-CM | POA: Diagnosis not present

## 2018-12-31 DIAGNOSIS — N186 End stage renal disease: Secondary | ICD-10-CM | POA: Diagnosis not present

## 2018-12-31 DIAGNOSIS — D631 Anemia in chronic kidney disease: Secondary | ICD-10-CM | POA: Diagnosis not present

## 2018-12-31 DIAGNOSIS — Z992 Dependence on renal dialysis: Secondary | ICD-10-CM | POA: Diagnosis not present

## 2018-12-31 DIAGNOSIS — D509 Iron deficiency anemia, unspecified: Secondary | ICD-10-CM | POA: Diagnosis not present

## 2019-01-02 DIAGNOSIS — E875 Hyperkalemia: Secondary | ICD-10-CM | POA: Diagnosis not present

## 2019-01-02 DIAGNOSIS — N2581 Secondary hyperparathyroidism of renal origin: Secondary | ICD-10-CM | POA: Diagnosis not present

## 2019-01-02 DIAGNOSIS — D509 Iron deficiency anemia, unspecified: Secondary | ICD-10-CM | POA: Diagnosis not present

## 2019-01-02 DIAGNOSIS — Z992 Dependence on renal dialysis: Secondary | ICD-10-CM | POA: Diagnosis not present

## 2019-01-02 DIAGNOSIS — N186 End stage renal disease: Secondary | ICD-10-CM | POA: Diagnosis not present

## 2019-01-02 DIAGNOSIS — D631 Anemia in chronic kidney disease: Secondary | ICD-10-CM | POA: Diagnosis not present

## 2019-01-04 DIAGNOSIS — E875 Hyperkalemia: Secondary | ICD-10-CM | POA: Diagnosis not present

## 2019-01-04 DIAGNOSIS — N186 End stage renal disease: Secondary | ICD-10-CM | POA: Diagnosis not present

## 2019-01-04 DIAGNOSIS — D509 Iron deficiency anemia, unspecified: Secondary | ICD-10-CM | POA: Diagnosis not present

## 2019-01-04 DIAGNOSIS — D631 Anemia in chronic kidney disease: Secondary | ICD-10-CM | POA: Diagnosis not present

## 2019-01-04 DIAGNOSIS — N2581 Secondary hyperparathyroidism of renal origin: Secondary | ICD-10-CM | POA: Diagnosis not present

## 2019-01-04 DIAGNOSIS — Z992 Dependence on renal dialysis: Secondary | ICD-10-CM | POA: Diagnosis not present

## 2019-01-07 DIAGNOSIS — Z992 Dependence on renal dialysis: Secondary | ICD-10-CM | POA: Diagnosis not present

## 2019-01-07 DIAGNOSIS — N2581 Secondary hyperparathyroidism of renal origin: Secondary | ICD-10-CM | POA: Diagnosis not present

## 2019-01-07 DIAGNOSIS — D631 Anemia in chronic kidney disease: Secondary | ICD-10-CM | POA: Diagnosis not present

## 2019-01-07 DIAGNOSIS — E875 Hyperkalemia: Secondary | ICD-10-CM | POA: Diagnosis not present

## 2019-01-07 DIAGNOSIS — N186 End stage renal disease: Secondary | ICD-10-CM | POA: Diagnosis not present

## 2019-01-07 DIAGNOSIS — D509 Iron deficiency anemia, unspecified: Secondary | ICD-10-CM | POA: Diagnosis not present

## 2019-01-09 DIAGNOSIS — D509 Iron deficiency anemia, unspecified: Secondary | ICD-10-CM | POA: Diagnosis not present

## 2019-01-09 DIAGNOSIS — E875 Hyperkalemia: Secondary | ICD-10-CM | POA: Diagnosis not present

## 2019-01-09 DIAGNOSIS — D631 Anemia in chronic kidney disease: Secondary | ICD-10-CM | POA: Diagnosis not present

## 2019-01-09 DIAGNOSIS — Z992 Dependence on renal dialysis: Secondary | ICD-10-CM | POA: Diagnosis not present

## 2019-01-09 DIAGNOSIS — N2581 Secondary hyperparathyroidism of renal origin: Secondary | ICD-10-CM | POA: Diagnosis not present

## 2019-01-09 DIAGNOSIS — N186 End stage renal disease: Secondary | ICD-10-CM | POA: Diagnosis not present

## 2019-01-11 DIAGNOSIS — N2581 Secondary hyperparathyroidism of renal origin: Secondary | ICD-10-CM | POA: Diagnosis not present

## 2019-01-11 DIAGNOSIS — D509 Iron deficiency anemia, unspecified: Secondary | ICD-10-CM | POA: Diagnosis not present

## 2019-01-11 DIAGNOSIS — Z992 Dependence on renal dialysis: Secondary | ICD-10-CM | POA: Diagnosis not present

## 2019-01-11 DIAGNOSIS — E875 Hyperkalemia: Secondary | ICD-10-CM | POA: Diagnosis not present

## 2019-01-11 DIAGNOSIS — N186 End stage renal disease: Secondary | ICD-10-CM | POA: Diagnosis not present

## 2019-01-11 DIAGNOSIS — D631 Anemia in chronic kidney disease: Secondary | ICD-10-CM | POA: Diagnosis not present

## 2019-01-14 DIAGNOSIS — N2581 Secondary hyperparathyroidism of renal origin: Secondary | ICD-10-CM | POA: Diagnosis not present

## 2019-01-14 DIAGNOSIS — D509 Iron deficiency anemia, unspecified: Secondary | ICD-10-CM | POA: Diagnosis not present

## 2019-01-14 DIAGNOSIS — N186 End stage renal disease: Secondary | ICD-10-CM | POA: Diagnosis not present

## 2019-01-14 DIAGNOSIS — Z992 Dependence on renal dialysis: Secondary | ICD-10-CM | POA: Diagnosis not present

## 2019-01-14 DIAGNOSIS — D631 Anemia in chronic kidney disease: Secondary | ICD-10-CM | POA: Diagnosis not present

## 2019-01-14 DIAGNOSIS — E875 Hyperkalemia: Secondary | ICD-10-CM | POA: Diagnosis not present

## 2019-01-15 DIAGNOSIS — I10 Essential (primary) hypertension: Secondary | ICD-10-CM | POA: Diagnosis not present

## 2019-01-15 DIAGNOSIS — Z992 Dependence on renal dialysis: Secondary | ICD-10-CM | POA: Diagnosis not present

## 2019-01-15 DIAGNOSIS — N186 End stage renal disease: Secondary | ICD-10-CM | POA: Diagnosis not present

## 2019-01-16 DIAGNOSIS — N2581 Secondary hyperparathyroidism of renal origin: Secondary | ICD-10-CM | POA: Diagnosis not present

## 2019-01-16 DIAGNOSIS — N186 End stage renal disease: Secondary | ICD-10-CM | POA: Diagnosis not present

## 2019-01-16 DIAGNOSIS — Z992 Dependence on renal dialysis: Secondary | ICD-10-CM | POA: Diagnosis not present

## 2019-01-16 DIAGNOSIS — E875 Hyperkalemia: Secondary | ICD-10-CM | POA: Diagnosis not present

## 2019-01-16 DIAGNOSIS — D631 Anemia in chronic kidney disease: Secondary | ICD-10-CM | POA: Diagnosis not present

## 2019-01-18 DIAGNOSIS — N186 End stage renal disease: Secondary | ICD-10-CM | POA: Diagnosis not present

## 2019-01-18 DIAGNOSIS — N2581 Secondary hyperparathyroidism of renal origin: Secondary | ICD-10-CM | POA: Diagnosis not present

## 2019-01-18 DIAGNOSIS — Z992 Dependence on renal dialysis: Secondary | ICD-10-CM | POA: Diagnosis not present

## 2019-01-18 DIAGNOSIS — D631 Anemia in chronic kidney disease: Secondary | ICD-10-CM | POA: Diagnosis not present

## 2019-01-18 DIAGNOSIS — E875 Hyperkalemia: Secondary | ICD-10-CM | POA: Diagnosis not present

## 2019-01-21 DIAGNOSIS — E875 Hyperkalemia: Secondary | ICD-10-CM | POA: Diagnosis not present

## 2019-01-21 DIAGNOSIS — N2581 Secondary hyperparathyroidism of renal origin: Secondary | ICD-10-CM | POA: Diagnosis not present

## 2019-01-21 DIAGNOSIS — N186 End stage renal disease: Secondary | ICD-10-CM | POA: Diagnosis not present

## 2019-01-21 DIAGNOSIS — Z992 Dependence on renal dialysis: Secondary | ICD-10-CM | POA: Diagnosis not present

## 2019-01-21 DIAGNOSIS — D631 Anemia in chronic kidney disease: Secondary | ICD-10-CM | POA: Diagnosis not present

## 2019-01-23 DIAGNOSIS — N2581 Secondary hyperparathyroidism of renal origin: Secondary | ICD-10-CM | POA: Diagnosis not present

## 2019-01-23 DIAGNOSIS — D631 Anemia in chronic kidney disease: Secondary | ICD-10-CM | POA: Diagnosis not present

## 2019-01-23 DIAGNOSIS — E875 Hyperkalemia: Secondary | ICD-10-CM | POA: Diagnosis not present

## 2019-01-23 DIAGNOSIS — N186 End stage renal disease: Secondary | ICD-10-CM | POA: Diagnosis not present

## 2019-01-23 DIAGNOSIS — Z992 Dependence on renal dialysis: Secondary | ICD-10-CM | POA: Diagnosis not present

## 2019-01-25 DIAGNOSIS — N186 End stage renal disease: Secondary | ICD-10-CM | POA: Diagnosis not present

## 2019-01-25 DIAGNOSIS — Z992 Dependence on renal dialysis: Secondary | ICD-10-CM | POA: Diagnosis not present

## 2019-01-25 DIAGNOSIS — D631 Anemia in chronic kidney disease: Secondary | ICD-10-CM | POA: Diagnosis not present

## 2019-01-25 DIAGNOSIS — N2581 Secondary hyperparathyroidism of renal origin: Secondary | ICD-10-CM | POA: Diagnosis not present

## 2019-01-25 DIAGNOSIS — E875 Hyperkalemia: Secondary | ICD-10-CM | POA: Diagnosis not present

## 2019-01-28 DIAGNOSIS — D631 Anemia in chronic kidney disease: Secondary | ICD-10-CM | POA: Diagnosis not present

## 2019-01-28 DIAGNOSIS — N2581 Secondary hyperparathyroidism of renal origin: Secondary | ICD-10-CM | POA: Diagnosis not present

## 2019-01-28 DIAGNOSIS — Z992 Dependence on renal dialysis: Secondary | ICD-10-CM | POA: Diagnosis not present

## 2019-01-28 DIAGNOSIS — E875 Hyperkalemia: Secondary | ICD-10-CM | POA: Diagnosis not present

## 2019-01-28 DIAGNOSIS — N186 End stage renal disease: Secondary | ICD-10-CM | POA: Diagnosis not present

## 2019-01-30 DIAGNOSIS — N2581 Secondary hyperparathyroidism of renal origin: Secondary | ICD-10-CM | POA: Diagnosis not present

## 2019-01-30 DIAGNOSIS — D631 Anemia in chronic kidney disease: Secondary | ICD-10-CM | POA: Diagnosis not present

## 2019-01-30 DIAGNOSIS — Z992 Dependence on renal dialysis: Secondary | ICD-10-CM | POA: Diagnosis not present

## 2019-01-30 DIAGNOSIS — N186 End stage renal disease: Secondary | ICD-10-CM | POA: Diagnosis not present

## 2019-01-30 DIAGNOSIS — E875 Hyperkalemia: Secondary | ICD-10-CM | POA: Diagnosis not present

## 2019-02-01 DIAGNOSIS — E875 Hyperkalemia: Secondary | ICD-10-CM | POA: Diagnosis not present

## 2019-02-01 DIAGNOSIS — N186 End stage renal disease: Secondary | ICD-10-CM | POA: Diagnosis not present

## 2019-02-01 DIAGNOSIS — D631 Anemia in chronic kidney disease: Secondary | ICD-10-CM | POA: Diagnosis not present

## 2019-02-01 DIAGNOSIS — N2581 Secondary hyperparathyroidism of renal origin: Secondary | ICD-10-CM | POA: Diagnosis not present

## 2019-02-01 DIAGNOSIS — Z992 Dependence on renal dialysis: Secondary | ICD-10-CM | POA: Diagnosis not present

## 2019-02-04 DIAGNOSIS — E875 Hyperkalemia: Secondary | ICD-10-CM | POA: Diagnosis not present

## 2019-02-04 DIAGNOSIS — N186 End stage renal disease: Secondary | ICD-10-CM | POA: Diagnosis not present

## 2019-02-04 DIAGNOSIS — Z992 Dependence on renal dialysis: Secondary | ICD-10-CM | POA: Diagnosis not present

## 2019-02-04 DIAGNOSIS — N2581 Secondary hyperparathyroidism of renal origin: Secondary | ICD-10-CM | POA: Diagnosis not present

## 2019-02-04 DIAGNOSIS — D631 Anemia in chronic kidney disease: Secondary | ICD-10-CM | POA: Diagnosis not present

## 2019-02-06 DIAGNOSIS — Z992 Dependence on renal dialysis: Secondary | ICD-10-CM | POA: Diagnosis not present

## 2019-02-06 DIAGNOSIS — N186 End stage renal disease: Secondary | ICD-10-CM | POA: Diagnosis not present

## 2019-02-06 DIAGNOSIS — E875 Hyperkalemia: Secondary | ICD-10-CM | POA: Diagnosis not present

## 2019-02-06 DIAGNOSIS — N2581 Secondary hyperparathyroidism of renal origin: Secondary | ICD-10-CM | POA: Diagnosis not present

## 2019-02-06 DIAGNOSIS — D631 Anemia in chronic kidney disease: Secondary | ICD-10-CM | POA: Diagnosis not present

## 2019-02-08 DIAGNOSIS — E875 Hyperkalemia: Secondary | ICD-10-CM | POA: Diagnosis not present

## 2019-02-08 DIAGNOSIS — N186 End stage renal disease: Secondary | ICD-10-CM | POA: Diagnosis not present

## 2019-02-08 DIAGNOSIS — D631 Anemia in chronic kidney disease: Secondary | ICD-10-CM | POA: Diagnosis not present

## 2019-02-08 DIAGNOSIS — N2581 Secondary hyperparathyroidism of renal origin: Secondary | ICD-10-CM | POA: Diagnosis not present

## 2019-02-08 DIAGNOSIS — Z992 Dependence on renal dialysis: Secondary | ICD-10-CM | POA: Diagnosis not present

## 2019-02-11 DIAGNOSIS — Z992 Dependence on renal dialysis: Secondary | ICD-10-CM | POA: Diagnosis not present

## 2019-02-11 DIAGNOSIS — N2581 Secondary hyperparathyroidism of renal origin: Secondary | ICD-10-CM | POA: Diagnosis not present

## 2019-02-11 DIAGNOSIS — N186 End stage renal disease: Secondary | ICD-10-CM | POA: Diagnosis not present

## 2019-02-11 DIAGNOSIS — D631 Anemia in chronic kidney disease: Secondary | ICD-10-CM | POA: Diagnosis not present

## 2019-02-11 DIAGNOSIS — E875 Hyperkalemia: Secondary | ICD-10-CM | POA: Diagnosis not present

## 2019-02-13 DIAGNOSIS — N186 End stage renal disease: Secondary | ICD-10-CM | POA: Diagnosis not present

## 2019-02-13 DIAGNOSIS — Z992 Dependence on renal dialysis: Secondary | ICD-10-CM | POA: Diagnosis not present

## 2019-02-13 DIAGNOSIS — E875 Hyperkalemia: Secondary | ICD-10-CM | POA: Diagnosis not present

## 2019-02-13 DIAGNOSIS — N2581 Secondary hyperparathyroidism of renal origin: Secondary | ICD-10-CM | POA: Diagnosis not present

## 2019-02-13 DIAGNOSIS — D631 Anemia in chronic kidney disease: Secondary | ICD-10-CM | POA: Diagnosis not present

## 2019-02-14 DIAGNOSIS — N186 End stage renal disease: Secondary | ICD-10-CM | POA: Diagnosis not present

## 2019-02-14 DIAGNOSIS — I10 Essential (primary) hypertension: Secondary | ICD-10-CM | POA: Diagnosis not present

## 2019-02-14 DIAGNOSIS — Z992 Dependence on renal dialysis: Secondary | ICD-10-CM | POA: Diagnosis not present

## 2019-02-15 DIAGNOSIS — D631 Anemia in chronic kidney disease: Secondary | ICD-10-CM | POA: Diagnosis not present

## 2019-02-15 DIAGNOSIS — Z992 Dependence on renal dialysis: Secondary | ICD-10-CM | POA: Diagnosis not present

## 2019-02-15 DIAGNOSIS — D509 Iron deficiency anemia, unspecified: Secondary | ICD-10-CM | POA: Diagnosis not present

## 2019-02-15 DIAGNOSIS — N2581 Secondary hyperparathyroidism of renal origin: Secondary | ICD-10-CM | POA: Diagnosis not present

## 2019-02-15 DIAGNOSIS — N186 End stage renal disease: Secondary | ICD-10-CM | POA: Diagnosis not present

## 2019-02-15 DIAGNOSIS — E875 Hyperkalemia: Secondary | ICD-10-CM | POA: Diagnosis not present

## 2019-02-18 DIAGNOSIS — D509 Iron deficiency anemia, unspecified: Secondary | ICD-10-CM | POA: Diagnosis not present

## 2019-02-18 DIAGNOSIS — N2581 Secondary hyperparathyroidism of renal origin: Secondary | ICD-10-CM | POA: Diagnosis not present

## 2019-02-18 DIAGNOSIS — N186 End stage renal disease: Secondary | ICD-10-CM | POA: Diagnosis not present

## 2019-02-18 DIAGNOSIS — D631 Anemia in chronic kidney disease: Secondary | ICD-10-CM | POA: Diagnosis not present

## 2019-02-18 DIAGNOSIS — E875 Hyperkalemia: Secondary | ICD-10-CM | POA: Diagnosis not present

## 2019-02-18 DIAGNOSIS — Z992 Dependence on renal dialysis: Secondary | ICD-10-CM | POA: Diagnosis not present

## 2019-02-20 DIAGNOSIS — Z992 Dependence on renal dialysis: Secondary | ICD-10-CM | POA: Diagnosis not present

## 2019-02-20 DIAGNOSIS — E875 Hyperkalemia: Secondary | ICD-10-CM | POA: Diagnosis not present

## 2019-02-20 DIAGNOSIS — N186 End stage renal disease: Secondary | ICD-10-CM | POA: Diagnosis not present

## 2019-02-20 DIAGNOSIS — D631 Anemia in chronic kidney disease: Secondary | ICD-10-CM | POA: Diagnosis not present

## 2019-02-20 DIAGNOSIS — D509 Iron deficiency anemia, unspecified: Secondary | ICD-10-CM | POA: Diagnosis not present

## 2019-02-20 DIAGNOSIS — N2581 Secondary hyperparathyroidism of renal origin: Secondary | ICD-10-CM | POA: Diagnosis not present

## 2019-02-22 DIAGNOSIS — D509 Iron deficiency anemia, unspecified: Secondary | ICD-10-CM | POA: Diagnosis not present

## 2019-02-22 DIAGNOSIS — E875 Hyperkalemia: Secondary | ICD-10-CM | POA: Diagnosis not present

## 2019-02-22 DIAGNOSIS — D631 Anemia in chronic kidney disease: Secondary | ICD-10-CM | POA: Diagnosis not present

## 2019-02-22 DIAGNOSIS — N186 End stage renal disease: Secondary | ICD-10-CM | POA: Diagnosis not present

## 2019-02-22 DIAGNOSIS — Z992 Dependence on renal dialysis: Secondary | ICD-10-CM | POA: Diagnosis not present

## 2019-02-22 DIAGNOSIS — N2581 Secondary hyperparathyroidism of renal origin: Secondary | ICD-10-CM | POA: Diagnosis not present

## 2019-02-25 DIAGNOSIS — N186 End stage renal disease: Secondary | ICD-10-CM | POA: Diagnosis not present

## 2019-02-25 DIAGNOSIS — N2581 Secondary hyperparathyroidism of renal origin: Secondary | ICD-10-CM | POA: Diagnosis not present

## 2019-02-25 DIAGNOSIS — Z992 Dependence on renal dialysis: Secondary | ICD-10-CM | POA: Diagnosis not present

## 2019-02-25 DIAGNOSIS — D631 Anemia in chronic kidney disease: Secondary | ICD-10-CM | POA: Diagnosis not present

## 2019-02-25 DIAGNOSIS — E875 Hyperkalemia: Secondary | ICD-10-CM | POA: Diagnosis not present

## 2019-02-25 DIAGNOSIS — D509 Iron deficiency anemia, unspecified: Secondary | ICD-10-CM | POA: Diagnosis not present

## 2019-02-27 DIAGNOSIS — D509 Iron deficiency anemia, unspecified: Secondary | ICD-10-CM | POA: Diagnosis not present

## 2019-02-27 DIAGNOSIS — N186 End stage renal disease: Secondary | ICD-10-CM | POA: Diagnosis not present

## 2019-02-27 DIAGNOSIS — E875 Hyperkalemia: Secondary | ICD-10-CM | POA: Diagnosis not present

## 2019-02-27 DIAGNOSIS — D631 Anemia in chronic kidney disease: Secondary | ICD-10-CM | POA: Diagnosis not present

## 2019-02-27 DIAGNOSIS — N2581 Secondary hyperparathyroidism of renal origin: Secondary | ICD-10-CM | POA: Diagnosis not present

## 2019-02-27 DIAGNOSIS — Z992 Dependence on renal dialysis: Secondary | ICD-10-CM | POA: Diagnosis not present

## 2019-03-01 DIAGNOSIS — N186 End stage renal disease: Secondary | ICD-10-CM | POA: Diagnosis not present

## 2019-03-01 DIAGNOSIS — N2581 Secondary hyperparathyroidism of renal origin: Secondary | ICD-10-CM | POA: Diagnosis not present

## 2019-03-01 DIAGNOSIS — Z992 Dependence on renal dialysis: Secondary | ICD-10-CM | POA: Diagnosis not present

## 2019-03-01 DIAGNOSIS — E875 Hyperkalemia: Secondary | ICD-10-CM | POA: Diagnosis not present

## 2019-03-01 DIAGNOSIS — D509 Iron deficiency anemia, unspecified: Secondary | ICD-10-CM | POA: Diagnosis not present

## 2019-03-01 DIAGNOSIS — D631 Anemia in chronic kidney disease: Secondary | ICD-10-CM | POA: Diagnosis not present

## 2019-03-04 DIAGNOSIS — Z992 Dependence on renal dialysis: Secondary | ICD-10-CM | POA: Diagnosis not present

## 2019-03-04 DIAGNOSIS — E875 Hyperkalemia: Secondary | ICD-10-CM | POA: Diagnosis not present

## 2019-03-04 DIAGNOSIS — N186 End stage renal disease: Secondary | ICD-10-CM | POA: Diagnosis not present

## 2019-03-04 DIAGNOSIS — D631 Anemia in chronic kidney disease: Secondary | ICD-10-CM | POA: Diagnosis not present

## 2019-03-04 DIAGNOSIS — N2581 Secondary hyperparathyroidism of renal origin: Secondary | ICD-10-CM | POA: Diagnosis not present

## 2019-03-04 DIAGNOSIS — D509 Iron deficiency anemia, unspecified: Secondary | ICD-10-CM | POA: Diagnosis not present

## 2019-03-06 DIAGNOSIS — E875 Hyperkalemia: Secondary | ICD-10-CM | POA: Diagnosis not present

## 2019-03-06 DIAGNOSIS — Z992 Dependence on renal dialysis: Secondary | ICD-10-CM | POA: Diagnosis not present

## 2019-03-06 DIAGNOSIS — D631 Anemia in chronic kidney disease: Secondary | ICD-10-CM | POA: Diagnosis not present

## 2019-03-06 DIAGNOSIS — D509 Iron deficiency anemia, unspecified: Secondary | ICD-10-CM | POA: Diagnosis not present

## 2019-03-06 DIAGNOSIS — N2581 Secondary hyperparathyroidism of renal origin: Secondary | ICD-10-CM | POA: Diagnosis not present

## 2019-03-06 DIAGNOSIS — N186 End stage renal disease: Secondary | ICD-10-CM | POA: Diagnosis not present

## 2019-03-08 DIAGNOSIS — N2581 Secondary hyperparathyroidism of renal origin: Secondary | ICD-10-CM | POA: Diagnosis not present

## 2019-03-08 DIAGNOSIS — N186 End stage renal disease: Secondary | ICD-10-CM | POA: Diagnosis not present

## 2019-03-08 DIAGNOSIS — D509 Iron deficiency anemia, unspecified: Secondary | ICD-10-CM | POA: Diagnosis not present

## 2019-03-08 DIAGNOSIS — Z992 Dependence on renal dialysis: Secondary | ICD-10-CM | POA: Diagnosis not present

## 2019-03-08 DIAGNOSIS — D631 Anemia in chronic kidney disease: Secondary | ICD-10-CM | POA: Diagnosis not present

## 2019-03-08 DIAGNOSIS — E875 Hyperkalemia: Secondary | ICD-10-CM | POA: Diagnosis not present

## 2019-03-11 DIAGNOSIS — D509 Iron deficiency anemia, unspecified: Secondary | ICD-10-CM | POA: Diagnosis not present

## 2019-03-11 DIAGNOSIS — E875 Hyperkalemia: Secondary | ICD-10-CM | POA: Diagnosis not present

## 2019-03-11 DIAGNOSIS — D631 Anemia in chronic kidney disease: Secondary | ICD-10-CM | POA: Diagnosis not present

## 2019-03-11 DIAGNOSIS — N2581 Secondary hyperparathyroidism of renal origin: Secondary | ICD-10-CM | POA: Diagnosis not present

## 2019-03-11 DIAGNOSIS — N186 End stage renal disease: Secondary | ICD-10-CM | POA: Diagnosis not present

## 2019-03-11 DIAGNOSIS — Z992 Dependence on renal dialysis: Secondary | ICD-10-CM | POA: Diagnosis not present

## 2019-03-13 DIAGNOSIS — E875 Hyperkalemia: Secondary | ICD-10-CM | POA: Diagnosis not present

## 2019-03-13 DIAGNOSIS — N2581 Secondary hyperparathyroidism of renal origin: Secondary | ICD-10-CM | POA: Diagnosis not present

## 2019-03-13 DIAGNOSIS — D509 Iron deficiency anemia, unspecified: Secondary | ICD-10-CM | POA: Diagnosis not present

## 2019-03-13 DIAGNOSIS — D631 Anemia in chronic kidney disease: Secondary | ICD-10-CM | POA: Diagnosis not present

## 2019-03-13 DIAGNOSIS — N186 End stage renal disease: Secondary | ICD-10-CM | POA: Diagnosis not present

## 2019-03-13 DIAGNOSIS — Z992 Dependence on renal dialysis: Secondary | ICD-10-CM | POA: Diagnosis not present

## 2019-03-15 DIAGNOSIS — Z992 Dependence on renal dialysis: Secondary | ICD-10-CM | POA: Diagnosis not present

## 2019-03-15 DIAGNOSIS — N2581 Secondary hyperparathyroidism of renal origin: Secondary | ICD-10-CM | POA: Diagnosis not present

## 2019-03-15 DIAGNOSIS — N186 End stage renal disease: Secondary | ICD-10-CM | POA: Diagnosis not present

## 2019-03-15 DIAGNOSIS — D509 Iron deficiency anemia, unspecified: Secondary | ICD-10-CM | POA: Diagnosis not present

## 2019-03-15 DIAGNOSIS — D631 Anemia in chronic kidney disease: Secondary | ICD-10-CM | POA: Diagnosis not present

## 2019-03-15 DIAGNOSIS — E875 Hyperkalemia: Secondary | ICD-10-CM | POA: Diagnosis not present

## 2019-03-17 DIAGNOSIS — Z992 Dependence on renal dialysis: Secondary | ICD-10-CM | POA: Diagnosis not present

## 2019-03-17 DIAGNOSIS — I10 Essential (primary) hypertension: Secondary | ICD-10-CM | POA: Diagnosis not present

## 2019-03-17 DIAGNOSIS — N186 End stage renal disease: Secondary | ICD-10-CM | POA: Diagnosis not present

## 2019-03-18 DIAGNOSIS — N2581 Secondary hyperparathyroidism of renal origin: Secondary | ICD-10-CM | POA: Diagnosis not present

## 2019-03-18 DIAGNOSIS — D631 Anemia in chronic kidney disease: Secondary | ICD-10-CM | POA: Diagnosis not present

## 2019-03-18 DIAGNOSIS — Z992 Dependence on renal dialysis: Secondary | ICD-10-CM | POA: Diagnosis not present

## 2019-03-18 DIAGNOSIS — N186 End stage renal disease: Secondary | ICD-10-CM | POA: Diagnosis not present

## 2019-03-18 DIAGNOSIS — E875 Hyperkalemia: Secondary | ICD-10-CM | POA: Diagnosis not present

## 2019-03-18 DIAGNOSIS — D509 Iron deficiency anemia, unspecified: Secondary | ICD-10-CM | POA: Diagnosis not present

## 2019-03-20 DIAGNOSIS — D631 Anemia in chronic kidney disease: Secondary | ICD-10-CM | POA: Diagnosis not present

## 2019-03-20 DIAGNOSIS — E875 Hyperkalemia: Secondary | ICD-10-CM | POA: Diagnosis not present

## 2019-03-20 DIAGNOSIS — N2581 Secondary hyperparathyroidism of renal origin: Secondary | ICD-10-CM | POA: Diagnosis not present

## 2019-03-20 DIAGNOSIS — Z992 Dependence on renal dialysis: Secondary | ICD-10-CM | POA: Diagnosis not present

## 2019-03-20 DIAGNOSIS — N186 End stage renal disease: Secondary | ICD-10-CM | POA: Diagnosis not present

## 2019-03-20 DIAGNOSIS — D509 Iron deficiency anemia, unspecified: Secondary | ICD-10-CM | POA: Diagnosis not present

## 2019-03-22 DIAGNOSIS — D631 Anemia in chronic kidney disease: Secondary | ICD-10-CM | POA: Diagnosis not present

## 2019-03-22 DIAGNOSIS — E875 Hyperkalemia: Secondary | ICD-10-CM | POA: Diagnosis not present

## 2019-03-22 DIAGNOSIS — D509 Iron deficiency anemia, unspecified: Secondary | ICD-10-CM | POA: Diagnosis not present

## 2019-03-22 DIAGNOSIS — Z992 Dependence on renal dialysis: Secondary | ICD-10-CM | POA: Diagnosis not present

## 2019-03-22 DIAGNOSIS — N2581 Secondary hyperparathyroidism of renal origin: Secondary | ICD-10-CM | POA: Diagnosis not present

## 2019-03-22 DIAGNOSIS — N186 End stage renal disease: Secondary | ICD-10-CM | POA: Diagnosis not present

## 2019-03-25 DIAGNOSIS — D631 Anemia in chronic kidney disease: Secondary | ICD-10-CM | POA: Diagnosis not present

## 2019-03-25 DIAGNOSIS — N186 End stage renal disease: Secondary | ICD-10-CM | POA: Diagnosis not present

## 2019-03-25 DIAGNOSIS — D509 Iron deficiency anemia, unspecified: Secondary | ICD-10-CM | POA: Diagnosis not present

## 2019-03-25 DIAGNOSIS — N2581 Secondary hyperparathyroidism of renal origin: Secondary | ICD-10-CM | POA: Diagnosis not present

## 2019-03-25 DIAGNOSIS — Z992 Dependence on renal dialysis: Secondary | ICD-10-CM | POA: Diagnosis not present

## 2019-03-25 DIAGNOSIS — E875 Hyperkalemia: Secondary | ICD-10-CM | POA: Diagnosis not present

## 2019-03-27 DIAGNOSIS — N2581 Secondary hyperparathyroidism of renal origin: Secondary | ICD-10-CM | POA: Diagnosis not present

## 2019-03-27 DIAGNOSIS — E875 Hyperkalemia: Secondary | ICD-10-CM | POA: Diagnosis not present

## 2019-03-27 DIAGNOSIS — D631 Anemia in chronic kidney disease: Secondary | ICD-10-CM | POA: Diagnosis not present

## 2019-03-27 DIAGNOSIS — N186 End stage renal disease: Secondary | ICD-10-CM | POA: Diagnosis not present

## 2019-03-27 DIAGNOSIS — Z992 Dependence on renal dialysis: Secondary | ICD-10-CM | POA: Diagnosis not present

## 2019-03-27 DIAGNOSIS — D509 Iron deficiency anemia, unspecified: Secondary | ICD-10-CM | POA: Diagnosis not present

## 2019-03-29 DIAGNOSIS — E875 Hyperkalemia: Secondary | ICD-10-CM | POA: Diagnosis not present

## 2019-03-29 DIAGNOSIS — D631 Anemia in chronic kidney disease: Secondary | ICD-10-CM | POA: Diagnosis not present

## 2019-03-29 DIAGNOSIS — Z992 Dependence on renal dialysis: Secondary | ICD-10-CM | POA: Diagnosis not present

## 2019-03-29 DIAGNOSIS — N2581 Secondary hyperparathyroidism of renal origin: Secondary | ICD-10-CM | POA: Diagnosis not present

## 2019-03-29 DIAGNOSIS — D509 Iron deficiency anemia, unspecified: Secondary | ICD-10-CM | POA: Diagnosis not present

## 2019-03-29 DIAGNOSIS — N186 End stage renal disease: Secondary | ICD-10-CM | POA: Diagnosis not present

## 2019-04-01 DIAGNOSIS — E875 Hyperkalemia: Secondary | ICD-10-CM | POA: Diagnosis not present

## 2019-04-01 DIAGNOSIS — D509 Iron deficiency anemia, unspecified: Secondary | ICD-10-CM | POA: Diagnosis not present

## 2019-04-01 DIAGNOSIS — D631 Anemia in chronic kidney disease: Secondary | ICD-10-CM | POA: Diagnosis not present

## 2019-04-01 DIAGNOSIS — N2581 Secondary hyperparathyroidism of renal origin: Secondary | ICD-10-CM | POA: Diagnosis not present

## 2019-04-01 DIAGNOSIS — Z992 Dependence on renal dialysis: Secondary | ICD-10-CM | POA: Diagnosis not present

## 2019-04-01 DIAGNOSIS — N186 End stage renal disease: Secondary | ICD-10-CM | POA: Diagnosis not present

## 2019-04-03 DIAGNOSIS — E875 Hyperkalemia: Secondary | ICD-10-CM | POA: Diagnosis not present

## 2019-04-03 DIAGNOSIS — D631 Anemia in chronic kidney disease: Secondary | ICD-10-CM | POA: Diagnosis not present

## 2019-04-03 DIAGNOSIS — N186 End stage renal disease: Secondary | ICD-10-CM | POA: Diagnosis not present

## 2019-04-03 DIAGNOSIS — D509 Iron deficiency anemia, unspecified: Secondary | ICD-10-CM | POA: Diagnosis not present

## 2019-04-03 DIAGNOSIS — Z992 Dependence on renal dialysis: Secondary | ICD-10-CM | POA: Diagnosis not present

## 2019-04-03 DIAGNOSIS — N2581 Secondary hyperparathyroidism of renal origin: Secondary | ICD-10-CM | POA: Diagnosis not present

## 2019-04-05 DIAGNOSIS — E875 Hyperkalemia: Secondary | ICD-10-CM | POA: Diagnosis not present

## 2019-04-05 DIAGNOSIS — N186 End stage renal disease: Secondary | ICD-10-CM | POA: Diagnosis not present

## 2019-04-05 DIAGNOSIS — D631 Anemia in chronic kidney disease: Secondary | ICD-10-CM | POA: Diagnosis not present

## 2019-04-05 DIAGNOSIS — Z992 Dependence on renal dialysis: Secondary | ICD-10-CM | POA: Diagnosis not present

## 2019-04-05 DIAGNOSIS — N2581 Secondary hyperparathyroidism of renal origin: Secondary | ICD-10-CM | POA: Diagnosis not present

## 2019-04-05 DIAGNOSIS — D509 Iron deficiency anemia, unspecified: Secondary | ICD-10-CM | POA: Diagnosis not present

## 2019-04-09 DIAGNOSIS — N2581 Secondary hyperparathyroidism of renal origin: Secondary | ICD-10-CM | POA: Diagnosis not present

## 2019-04-09 DIAGNOSIS — D631 Anemia in chronic kidney disease: Secondary | ICD-10-CM | POA: Diagnosis not present

## 2019-04-09 DIAGNOSIS — Z992 Dependence on renal dialysis: Secondary | ICD-10-CM | POA: Diagnosis not present

## 2019-04-09 DIAGNOSIS — D509 Iron deficiency anemia, unspecified: Secondary | ICD-10-CM | POA: Diagnosis not present

## 2019-04-09 DIAGNOSIS — E875 Hyperkalemia: Secondary | ICD-10-CM | POA: Diagnosis not present

## 2019-04-09 DIAGNOSIS — N186 End stage renal disease: Secondary | ICD-10-CM | POA: Diagnosis not present

## 2019-04-12 DIAGNOSIS — Z992 Dependence on renal dialysis: Secondary | ICD-10-CM | POA: Diagnosis not present

## 2019-04-12 DIAGNOSIS — N2581 Secondary hyperparathyroidism of renal origin: Secondary | ICD-10-CM | POA: Diagnosis not present

## 2019-04-12 DIAGNOSIS — D509 Iron deficiency anemia, unspecified: Secondary | ICD-10-CM | POA: Diagnosis not present

## 2019-04-12 DIAGNOSIS — D631 Anemia in chronic kidney disease: Secondary | ICD-10-CM | POA: Diagnosis not present

## 2019-04-12 DIAGNOSIS — N186 End stage renal disease: Secondary | ICD-10-CM | POA: Diagnosis not present

## 2019-04-12 DIAGNOSIS — E875 Hyperkalemia: Secondary | ICD-10-CM | POA: Diagnosis not present

## 2019-04-15 DIAGNOSIS — N2581 Secondary hyperparathyroidism of renal origin: Secondary | ICD-10-CM | POA: Diagnosis not present

## 2019-04-15 DIAGNOSIS — N186 End stage renal disease: Secondary | ICD-10-CM | POA: Diagnosis not present

## 2019-04-15 DIAGNOSIS — E875 Hyperkalemia: Secondary | ICD-10-CM | POA: Diagnosis not present

## 2019-04-15 DIAGNOSIS — Z992 Dependence on renal dialysis: Secondary | ICD-10-CM | POA: Diagnosis not present

## 2019-04-15 DIAGNOSIS — D631 Anemia in chronic kidney disease: Secondary | ICD-10-CM | POA: Diagnosis not present

## 2019-04-15 DIAGNOSIS — D509 Iron deficiency anemia, unspecified: Secondary | ICD-10-CM | POA: Diagnosis not present

## 2019-05-20 DIAGNOSIS — N186 End stage renal disease: Secondary | ICD-10-CM | POA: Diagnosis not present

## 2019-05-20 DIAGNOSIS — Z992 Dependence on renal dialysis: Secondary | ICD-10-CM | POA: Diagnosis not present

## 2019-05-20 DIAGNOSIS — N2581 Secondary hyperparathyroidism of renal origin: Secondary | ICD-10-CM | POA: Diagnosis not present

## 2019-05-22 DIAGNOSIS — Z992 Dependence on renal dialysis: Secondary | ICD-10-CM | POA: Diagnosis not present

## 2019-05-22 DIAGNOSIS — N2581 Secondary hyperparathyroidism of renal origin: Secondary | ICD-10-CM | POA: Diagnosis not present

## 2019-05-22 DIAGNOSIS — N186 End stage renal disease: Secondary | ICD-10-CM | POA: Diagnosis not present

## 2019-05-24 DIAGNOSIS — N2581 Secondary hyperparathyroidism of renal origin: Secondary | ICD-10-CM | POA: Diagnosis not present

## 2019-05-24 DIAGNOSIS — N186 End stage renal disease: Secondary | ICD-10-CM | POA: Diagnosis not present

## 2019-05-24 DIAGNOSIS — Z992 Dependence on renal dialysis: Secondary | ICD-10-CM | POA: Diagnosis not present

## 2019-05-27 DIAGNOSIS — N2581 Secondary hyperparathyroidism of renal origin: Secondary | ICD-10-CM | POA: Diagnosis not present

## 2019-05-27 DIAGNOSIS — N186 End stage renal disease: Secondary | ICD-10-CM | POA: Diagnosis not present

## 2019-05-27 DIAGNOSIS — Z992 Dependence on renal dialysis: Secondary | ICD-10-CM | POA: Diagnosis not present

## 2019-05-29 DIAGNOSIS — N186 End stage renal disease: Secondary | ICD-10-CM | POA: Diagnosis not present

## 2019-05-29 DIAGNOSIS — Z992 Dependence on renal dialysis: Secondary | ICD-10-CM | POA: Diagnosis not present

## 2019-05-29 DIAGNOSIS — N2581 Secondary hyperparathyroidism of renal origin: Secondary | ICD-10-CM | POA: Diagnosis not present

## 2019-05-31 DIAGNOSIS — Z992 Dependence on renal dialysis: Secondary | ICD-10-CM | POA: Diagnosis not present

## 2019-05-31 DIAGNOSIS — N2581 Secondary hyperparathyroidism of renal origin: Secondary | ICD-10-CM | POA: Diagnosis not present

## 2019-05-31 DIAGNOSIS — N186 End stage renal disease: Secondary | ICD-10-CM | POA: Diagnosis not present

## 2019-06-03 DIAGNOSIS — N2581 Secondary hyperparathyroidism of renal origin: Secondary | ICD-10-CM | POA: Diagnosis not present

## 2019-06-03 DIAGNOSIS — N186 End stage renal disease: Secondary | ICD-10-CM | POA: Diagnosis not present

## 2019-06-03 DIAGNOSIS — Z992 Dependence on renal dialysis: Secondary | ICD-10-CM | POA: Diagnosis not present

## 2019-06-05 DIAGNOSIS — N2581 Secondary hyperparathyroidism of renal origin: Secondary | ICD-10-CM | POA: Diagnosis not present

## 2019-06-05 DIAGNOSIS — N186 End stage renal disease: Secondary | ICD-10-CM | POA: Diagnosis not present

## 2019-06-05 DIAGNOSIS — Z992 Dependence on renal dialysis: Secondary | ICD-10-CM | POA: Diagnosis not present

## 2019-06-07 DIAGNOSIS — N2581 Secondary hyperparathyroidism of renal origin: Secondary | ICD-10-CM | POA: Diagnosis not present

## 2019-06-07 DIAGNOSIS — Z992 Dependence on renal dialysis: Secondary | ICD-10-CM | POA: Diagnosis not present

## 2019-06-07 DIAGNOSIS — N186 End stage renal disease: Secondary | ICD-10-CM | POA: Diagnosis not present

## 2019-06-10 DIAGNOSIS — N186 End stage renal disease: Secondary | ICD-10-CM | POA: Diagnosis not present

## 2019-06-10 DIAGNOSIS — N2581 Secondary hyperparathyroidism of renal origin: Secondary | ICD-10-CM | POA: Diagnosis not present

## 2019-06-10 DIAGNOSIS — Z992 Dependence on renal dialysis: Secondary | ICD-10-CM | POA: Diagnosis not present

## 2019-06-12 DIAGNOSIS — N186 End stage renal disease: Secondary | ICD-10-CM | POA: Diagnosis not present

## 2019-06-12 DIAGNOSIS — Z992 Dependence on renal dialysis: Secondary | ICD-10-CM | POA: Diagnosis not present

## 2019-06-12 DIAGNOSIS — N2581 Secondary hyperparathyroidism of renal origin: Secondary | ICD-10-CM | POA: Diagnosis not present

## 2019-06-14 DIAGNOSIS — N186 End stage renal disease: Secondary | ICD-10-CM | POA: Diagnosis not present

## 2019-06-14 DIAGNOSIS — Z992 Dependence on renal dialysis: Secondary | ICD-10-CM | POA: Diagnosis not present

## 2019-06-14 DIAGNOSIS — N2581 Secondary hyperparathyroidism of renal origin: Secondary | ICD-10-CM | POA: Diagnosis not present

## 2019-06-16 DIAGNOSIS — N186 End stage renal disease: Secondary | ICD-10-CM | POA: Diagnosis not present

## 2019-06-16 DIAGNOSIS — Z992 Dependence on renal dialysis: Secondary | ICD-10-CM | POA: Diagnosis not present

## 2019-06-16 DIAGNOSIS — I10 Essential (primary) hypertension: Secondary | ICD-10-CM | POA: Diagnosis not present

## 2019-06-17 DIAGNOSIS — Z992 Dependence on renal dialysis: Secondary | ICD-10-CM | POA: Diagnosis not present

## 2019-06-17 DIAGNOSIS — N186 End stage renal disease: Secondary | ICD-10-CM | POA: Diagnosis not present

## 2019-06-17 DIAGNOSIS — N2581 Secondary hyperparathyroidism of renal origin: Secondary | ICD-10-CM | POA: Diagnosis not present

## 2019-06-19 DIAGNOSIS — N2581 Secondary hyperparathyroidism of renal origin: Secondary | ICD-10-CM | POA: Diagnosis not present

## 2019-06-19 DIAGNOSIS — N186 End stage renal disease: Secondary | ICD-10-CM | POA: Diagnosis not present

## 2019-06-19 DIAGNOSIS — Z992 Dependence on renal dialysis: Secondary | ICD-10-CM | POA: Diagnosis not present

## 2019-06-21 DIAGNOSIS — N186 End stage renal disease: Secondary | ICD-10-CM | POA: Diagnosis not present

## 2019-06-21 DIAGNOSIS — N2581 Secondary hyperparathyroidism of renal origin: Secondary | ICD-10-CM | POA: Diagnosis not present

## 2019-06-21 DIAGNOSIS — Z992 Dependence on renal dialysis: Secondary | ICD-10-CM | POA: Diagnosis not present

## 2019-06-24 DIAGNOSIS — N186 End stage renal disease: Secondary | ICD-10-CM | POA: Diagnosis not present

## 2019-06-24 DIAGNOSIS — Z992 Dependence on renal dialysis: Secondary | ICD-10-CM | POA: Diagnosis not present

## 2019-06-24 DIAGNOSIS — N2581 Secondary hyperparathyroidism of renal origin: Secondary | ICD-10-CM | POA: Diagnosis not present

## 2019-06-27 DIAGNOSIS — N2581 Secondary hyperparathyroidism of renal origin: Secondary | ICD-10-CM | POA: Diagnosis not present

## 2019-06-27 DIAGNOSIS — N186 End stage renal disease: Secondary | ICD-10-CM | POA: Diagnosis not present

## 2019-06-27 DIAGNOSIS — Z992 Dependence on renal dialysis: Secondary | ICD-10-CM | POA: Diagnosis not present

## 2019-06-28 DIAGNOSIS — N186 End stage renal disease: Secondary | ICD-10-CM | POA: Diagnosis not present

## 2019-06-28 DIAGNOSIS — Z992 Dependence on renal dialysis: Secondary | ICD-10-CM | POA: Diagnosis not present

## 2019-06-28 DIAGNOSIS — N2581 Secondary hyperparathyroidism of renal origin: Secondary | ICD-10-CM | POA: Diagnosis not present

## 2019-07-01 DIAGNOSIS — N186 End stage renal disease: Secondary | ICD-10-CM | POA: Diagnosis not present

## 2019-07-01 DIAGNOSIS — N2581 Secondary hyperparathyroidism of renal origin: Secondary | ICD-10-CM | POA: Diagnosis not present

## 2019-07-01 DIAGNOSIS — Z992 Dependence on renal dialysis: Secondary | ICD-10-CM | POA: Diagnosis not present

## 2019-07-03 DIAGNOSIS — N2581 Secondary hyperparathyroidism of renal origin: Secondary | ICD-10-CM | POA: Diagnosis not present

## 2019-07-03 DIAGNOSIS — Z992 Dependence on renal dialysis: Secondary | ICD-10-CM | POA: Diagnosis not present

## 2019-07-03 DIAGNOSIS — N186 End stage renal disease: Secondary | ICD-10-CM | POA: Diagnosis not present

## 2019-07-05 DIAGNOSIS — N2581 Secondary hyperparathyroidism of renal origin: Secondary | ICD-10-CM | POA: Diagnosis not present

## 2019-07-05 DIAGNOSIS — Z992 Dependence on renal dialysis: Secondary | ICD-10-CM | POA: Diagnosis not present

## 2019-07-05 DIAGNOSIS — N186 End stage renal disease: Secondary | ICD-10-CM | POA: Diagnosis not present

## 2019-07-08 DIAGNOSIS — N2581 Secondary hyperparathyroidism of renal origin: Secondary | ICD-10-CM | POA: Diagnosis not present

## 2019-07-08 DIAGNOSIS — Z992 Dependence on renal dialysis: Secondary | ICD-10-CM | POA: Diagnosis not present

## 2019-07-08 DIAGNOSIS — N186 End stage renal disease: Secondary | ICD-10-CM | POA: Diagnosis not present

## 2019-07-10 DIAGNOSIS — N186 End stage renal disease: Secondary | ICD-10-CM | POA: Diagnosis not present

## 2019-07-10 DIAGNOSIS — N2581 Secondary hyperparathyroidism of renal origin: Secondary | ICD-10-CM | POA: Diagnosis not present

## 2019-07-10 DIAGNOSIS — Z992 Dependence on renal dialysis: Secondary | ICD-10-CM | POA: Diagnosis not present

## 2019-07-12 DIAGNOSIS — Z992 Dependence on renal dialysis: Secondary | ICD-10-CM | POA: Diagnosis not present

## 2019-07-12 DIAGNOSIS — N186 End stage renal disease: Secondary | ICD-10-CM | POA: Diagnosis not present

## 2019-07-12 DIAGNOSIS — N2581 Secondary hyperparathyroidism of renal origin: Secondary | ICD-10-CM | POA: Diagnosis not present

## 2019-07-14 DIAGNOSIS — Z992 Dependence on renal dialysis: Secondary | ICD-10-CM | POA: Diagnosis not present

## 2019-07-14 DIAGNOSIS — I10 Essential (primary) hypertension: Secondary | ICD-10-CM | POA: Diagnosis not present

## 2019-07-14 DIAGNOSIS — N186 End stage renal disease: Secondary | ICD-10-CM | POA: Diagnosis not present

## 2019-07-15 DIAGNOSIS — N186 End stage renal disease: Secondary | ICD-10-CM | POA: Diagnosis not present

## 2019-07-15 DIAGNOSIS — Z992 Dependence on renal dialysis: Secondary | ICD-10-CM | POA: Diagnosis not present

## 2019-07-15 DIAGNOSIS — N2581 Secondary hyperparathyroidism of renal origin: Secondary | ICD-10-CM | POA: Diagnosis not present

## 2019-07-18 DIAGNOSIS — Z992 Dependence on renal dialysis: Secondary | ICD-10-CM | POA: Diagnosis not present

## 2019-07-18 DIAGNOSIS — N186 End stage renal disease: Secondary | ICD-10-CM | POA: Diagnosis not present

## 2019-07-18 DIAGNOSIS — N2581 Secondary hyperparathyroidism of renal origin: Secondary | ICD-10-CM | POA: Diagnosis not present

## 2019-07-19 DIAGNOSIS — Z992 Dependence on renal dialysis: Secondary | ICD-10-CM | POA: Diagnosis not present

## 2019-07-19 DIAGNOSIS — N186 End stage renal disease: Secondary | ICD-10-CM | POA: Diagnosis not present

## 2019-07-19 DIAGNOSIS — N2581 Secondary hyperparathyroidism of renal origin: Secondary | ICD-10-CM | POA: Diagnosis not present

## 2019-07-22 DIAGNOSIS — Z992 Dependence on renal dialysis: Secondary | ICD-10-CM | POA: Diagnosis not present

## 2019-07-22 DIAGNOSIS — N2581 Secondary hyperparathyroidism of renal origin: Secondary | ICD-10-CM | POA: Diagnosis not present

## 2019-07-22 DIAGNOSIS — N186 End stage renal disease: Secondary | ICD-10-CM | POA: Diagnosis not present

## 2019-07-24 DIAGNOSIS — N186 End stage renal disease: Secondary | ICD-10-CM | POA: Diagnosis not present

## 2019-07-24 DIAGNOSIS — N2581 Secondary hyperparathyroidism of renal origin: Secondary | ICD-10-CM | POA: Diagnosis not present

## 2019-07-24 DIAGNOSIS — Z992 Dependence on renal dialysis: Secondary | ICD-10-CM | POA: Diagnosis not present

## 2019-07-25 ENCOUNTER — Other Ambulatory Visit: Payer: Self-pay

## 2019-07-25 ENCOUNTER — Emergency Department (HOSPITAL_COMMUNITY): Payer: Medicare HMO

## 2019-07-25 ENCOUNTER — Emergency Department (HOSPITAL_COMMUNITY)
Admission: EM | Admit: 2019-07-25 | Discharge: 2019-07-25 | Disposition: A | Discharge status: Home / Self Care | Payer: Medicare HMO | Attending: Emergency Medicine | Admitting: Emergency Medicine

## 2019-07-25 ENCOUNTER — Encounter (HOSPITAL_COMMUNITY): Payer: Self-pay

## 2019-07-25 DIAGNOSIS — R0789 Other chest pain: Secondary | ICD-10-CM | POA: Insufficient documentation

## 2019-07-25 DIAGNOSIS — R0602 Shortness of breath: Secondary | ICD-10-CM

## 2019-07-25 DIAGNOSIS — Z79899 Other long term (current) drug therapy: Secondary | ICD-10-CM | POA: Diagnosis not present

## 2019-07-25 DIAGNOSIS — J449 Chronic obstructive pulmonary disease, unspecified: Secondary | ICD-10-CM | POA: Insufficient documentation

## 2019-07-25 DIAGNOSIS — Z992 Dependence on renal dialysis: Secondary | ICD-10-CM | POA: Insufficient documentation

## 2019-07-25 DIAGNOSIS — I132 Hypertensive heart and chronic kidney disease with heart failure and with stage 5 chronic kidney disease, or end stage renal disease: Secondary | ICD-10-CM | POA: Insufficient documentation

## 2019-07-25 DIAGNOSIS — N186 End stage renal disease: Secondary | ICD-10-CM | POA: Insufficient documentation

## 2019-07-25 DIAGNOSIS — F17210 Nicotine dependence, cigarettes, uncomplicated: Secondary | ICD-10-CM | POA: Insufficient documentation

## 2019-07-25 DIAGNOSIS — I5032 Chronic diastolic (congestive) heart failure: Secondary | ICD-10-CM | POA: Diagnosis not present

## 2019-07-25 DIAGNOSIS — Z7982 Long term (current) use of aspirin: Secondary | ICD-10-CM | POA: Insufficient documentation

## 2019-07-25 DIAGNOSIS — R079 Chest pain, unspecified: Secondary | ICD-10-CM | POA: Diagnosis not present

## 2019-07-25 LAB — CBC
HCT: 27.3 % — ABNORMAL LOW (ref 39.0–52.0)
HCT: 27.5 % — ABNORMAL LOW (ref 39.0–52.0)
Hemoglobin: 8.3 g/dL — ABNORMAL LOW (ref 13.0–17.0)
Hemoglobin: 8.5 g/dL — ABNORMAL LOW (ref 13.0–17.0)
MCH: 33.1 pg (ref 26.0–34.0)
MCH: 33.2 pg (ref 26.0–34.0)
MCHC: 30.4 g/dL (ref 30.0–36.0)
MCHC: 30.9 g/dL (ref 30.0–36.0)
MCV: 108.8 fL — ABNORMAL HIGH (ref 80.0–100.0)
MCV: 107.4 fL — ABNORMAL HIGH (ref 80.0–100.0)
Platelets: 181 10*3/uL (ref 150–400)
Platelets: 165 10*3/uL (ref 150–400)
RBC: 2.51 MIL/uL — ABNORMAL LOW (ref 4.22–5.81)
RBC: 2.56 MIL/uL — ABNORMAL LOW (ref 4.22–5.81)
RDW: 19.8 % — ABNORMAL HIGH (ref 11.5–15.5)
RDW: 19.8 % — ABNORMAL HIGH (ref 11.5–15.5)
WBC: 5.5 10*3/uL (ref 4.0–10.5)
WBC: 5.1 10*3/uL (ref 4.0–10.5)
nRBC: 0 % (ref 0.0–0.2)
nRBC: 0 % (ref 0.0–0.2)

## 2019-07-25 LAB — BASIC METABOLIC PANEL
Anion gap: 17 — ABNORMAL HIGH (ref 5–15)
BUN: 43 mg/dL — ABNORMAL HIGH (ref 6–20)
CO2: 29 mmol/L (ref 22–32)
Calcium: 8.6 mg/dL — ABNORMAL LOW (ref 8.9–10.3)
Chloride: 93 mmol/L — ABNORMAL LOW (ref 98–111)
Creatinine, Ser: 9.39 mg/dL — ABNORMAL HIGH (ref 0.61–1.24)
GFR calc Af Amer: 7 mL/min — ABNORMAL LOW (ref >60)
GFR calc non Af Amer: 6 mL/min — ABNORMAL LOW (ref >60)
Glucose, Bld: 105 mg/dL — ABNORMAL HIGH (ref 70–99)
Potassium: 5.1 mmol/L (ref 3.5–5.1)
Sodium: 139 mmol/L (ref 135–145)

## 2019-07-25 LAB — TROPONIN I (HIGH SENSITIVITY)
Troponin I (High Sensitivity): 33 ng/L — ABNORMAL HIGH (ref <18)
Troponin I (High Sensitivity): 31 ng/L — ABNORMAL HIGH (ref <18)

## 2019-07-25 NOTE — ED Triage Notes (Signed)
Pt sent by dialysis center for hgb of 7. Last dialysis treatment was yesterday. Reports SOB, chest pain and generalized fatigue over the past 2 weeks. Pt a.o

## 2019-07-25 NOTE — Discharge Instructions (Signed)
Your work-up today did not show worsened anemia.  Your hemoglobin was above 8 and was actually increasing on repeat.  Thus, we do not feel you need emergent blood transfusion.  Your other work-up was similar and overall reassuring.  Your cardiac enzymes were not rising.  Your chest x-ray did show fluid which fit with our exam however you are not hypoxic when he walks around.  Thus, we feel you are safe to go to your dialysis tomorrow.  Please touch base with your nephrology team to see if they will take off more fluid tomorrow if needed.  If any symptoms change or worsen acutely, please return to nearest emergency department.

## 2019-07-25 NOTE — ED Provider Notes (Signed)
[facility] EMERGENCY DEPARTMENT Provider Note   CSN: [id number] Arrival date & time: 07/25/19  7322     History Chief Complaint  Patient presents with  . Shortness of Breath  . Chest Pain  . Anemia    Jeremy Mccoy is a 54 y.o. male.  The history is provided by the patient and medical records. No language interpreter was used.  Shortness of Breath Severity:  Moderate Onset quality:  Gradual Duration:  2 weeks Timing:  Constant Progression:  Waxing and waning Chronicity:  Recurrent Context: not URI   Relieved by:  Nothing Worsened by:  Exertion Ineffective treatments:  None tried Associated symptoms: chest pain   Associated symptoms: no cough, no diaphoresis, no fever, no headaches, no neck pain, no rash, no sputum production, no vomiting and no wheezing        Past Medical History:  Diagnosis Date  . Anemia   . Anxiety   . Arthritis    "qwhere" (05/15/2013)  . CHF (congestive heart failure) ([place])   . COPD (chronic obstructive pulmonary disease) ([place])   . Crack cocaine use   . Dental caries   . Depression   . End stage renal disease ([place]) 01/11/2012   Patient presented 11/04/11 to hospital with CP, wt loss and N/V. Creat was 10, admitted. 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 ANA was + 1:80, +MPO and +pr3 Ab's (ANCA). Received plasmapheresis x 7, IV cytoxan and pred taper. First HD was 11/17/11. 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 Pt d/c'd to do outpt HD and may have gotten 1-2 additional Cytoxan as OP, but this was stopped eventually since renal function didn't recover. Now gets HD TTS schedule at [facility].  L forearm AVF 11/19/11 by Dr. Scot Dock is current access.    Marland Kitchen ESRD (end stage renal disease) on dialysis [facility])    TTS: [address] [place] (05/15/2013)  . ESRD (end stage renal disease) on dialysis ([place])    . Headache(784.0)    "get it from going to dialysis; when they get real bad I come to hospital" (05/15/2013)  . Headache(784.0)   . Hematemesis/vomiting blood   . History of blood transfusion 10/2011; 04/2013  . HTN (hypertension) 06/12/2012  . Renal insufficiency   . Seizures ([place])   . Shortness of breath    "just when I have too much potassium is too high" (05/15/2013)    Patient Active Problem List   Diagnosis Date Noted  . ESRD (end stage renal disease) on dialysis ([place]) 10/20/2017  . Plantar fasciitis, bilateral 09/12/2017  . Hyperkalemia 02/23/2016  . Coughing blood   . Vasculitis ([place])   . Lung crackles   . Hemoptysis 07/25/2015  . Anemia 02/20/2014  . Symptomatic anemia 02/20/2014  . PAF (paroxysmal atrial fibrillation) ([place]) 01/31/2014  . Chronic diastolic congestive heart failure ([employer]) 01/31/2014  . COPD (chronic obstructive pulmonary disease) ([place]) 01/31/2014  . Thrombocytopenia ([place]) 01/23/2014  . Unspecified constipation 01/23/2014  . High anion gap metabolic acidosis 02/54/2706  . ESRD on hemodialysis ([place]) 11/05/2013  . Seizure disorder ([place]) 11/04/2013  . Protein-calorie malnutrition, severe ([place]) 07/12/2013  . Respiratory failure ([place]) 07/10/2013  . Status epilepticus ([place]) 07/10/2013  . Essential hypertension 07/10/2013  . Acute blood loss anemia 05/15/2013  . Hematemesis 05/15/2013  . Volume excess 09/20/2012  . Abnormal EKG 09/20/2012  . Drug abuse ([place])  09/20/2012  . Elevated troponin 09/20/2012  . Anemia in chronic kidney disease 09/20/2012  . Pulmonary edema 06/12/2012  . Cocaine abuse ([place]) 06/12/2012  . End stage renal disease ([place]) 01/11/2012  . Tobacco abuse 11/04/2011  . Dental caries 11/04/2011  . Color blindness 11/04/2011    Past Surgical History:  Procedure Laterality Date  . AV FISTULA PLACEMENT  11/29/2011   Procedure: ARTERIOVENOUS (AV) FISTULA CREATION;  Surgeon: Angelia Mould, MD;  Location[facility] OR;  Service: Vascular;   Laterality: Left;  . ESOPHAGOGASTRODUODENOSCOPY N/A 05/17/2013   Procedure: ESOPHAGOGASTRODUODENOSCOPY (EGD);  Surgeon: Beryle Beams, MD;  Location: [facility] ENDOSCOPY;  Service: Endoscopy;  Laterality: N/A;  . INGUINAL HERNIA REPAIR Bilateral 1967  . IR FLUORO GUIDE CV LINE RIGHT  10/20/2017  . IR THROMBECTOMY AV FISTULA W/THROMBOLYSIS/PTA INC/SHUNT/IMG LEFT Left 10/24/2017  . IR US GUIDE VASC ACCESS LEFT  10/24/2017  . IR US GUIDE VASC ACCESS RIGHT  10/20/2017  . RENAL BIOPSY  11/07/2011       Family History  Problem Relation Age of Onset  . Heart attack Father     Social History   Tobacco Use  . Smoking status: Current Every Day Smoker    Packs/day: 1.00    Years: 35.00    Pack years: 35.00    Types: Cigars, Cigarettes  . Smokeless tobacco: Never Used  Substance Use Topics  . Alcohol use: No    Comment: 05/15/2013 "aien't drank in ~ 25 yrs"  . Drug use: Yes    Types: Marijuana, Cocaine    Comment: Precription drugs (e.g. percocet) "I take what I have to to controll my constant pain" (05/15/2013)    Home Medications Prior to Admission medications   Medication Sig Start Date End Date Taking? Authorizing Provider  acetaminophen (TYLENOL) 500 MG tablet Take 1 tablet (500 mg total) by mouth every 8 (eight) hours as needed for mild pain or fever. 6 times a day, 500 mg, 12 to 24 tabs a day, 6,000 to 12,000 mg daily for pain Patient taking differently: Take 500 mg by mouth every 8 (eight) hours as needed for mild pain or fever.  08/05/16   Molt, Bethany, DO  amLODipine (NORVASC) 10 MG tablet Take 1 tablet (10 mg total) by mouth daily. 01/25/14   McLean-Scocuzza, Nino Glow, MD  aspirin 81 MG chewable tablet Chew 1 tablet (81 mg total) by mouth daily. 01/25/14   McLean-Scocuzza, Nino Glow, MD  cloNIDine (CATAPRES) 0.3 MG tablet Take 0.3 mg by mouth 3 (three) times daily.     [provider]  hydrOXYzine (ATARAX/VISTARIL) 25 MG tablet Take 25 mg by mouth 3 (three) times daily as needed for  itching.  06/10/15   [provider]  losartan (COZAAR) 100 MG tablet Take 100 mg by mouth at bedtime. 08/02/16   [provider]  multivitamin (RENA-VIT) TABS tablet Take 1 tablet by mouth at bedtime. 11/29/11   Alric Seton, PA-C  Nutritional Supplements (FEEDING SUPPLEMENT, NEPRO CARB STEADY,) LIQD Take 237 mLs by mouth 3 (three) times daily as needed (Supplement). 05/17/13   Mikhail, Velta Addison, DO  pantoprazole (PROTONIX) 20 MG tablet Take 20 mg by mouth daily. 02/21/16   [provider]  polyethylene glycol (MIRALAX / GLYCOLAX) packet Take 17 g by mouth daily as needed for mild constipation. 08/05/16   Molt, Bethany, DO  ranitidine (ZANTAC) 300 MG tablet Take 300 mg by mouth daily as needed for heartburn.    [provider]  sevelamer carbonate (RENVELA)  800 MG tablet Take 2 tablets (1,600 mg total) by mouth 3 (three) times daily with meals. Patient taking differently: Take 2,400 mg by mouth 3 (three) times daily with meals. And with snacks 01/25/14   McLean-Scocuzza, Nino Glow, MD    Allergies    Patient has no known allergies.  Review of Systems   Review of Systems  Constitutional: Positive for fatigue. Negative for chills, diaphoresis and fever.  HENT: Negative for congestion.   Eyes: Negative for visual disturbance.  Respiratory: Positive for chest tightness and shortness of breath. Negative for cough, sputum production, choking, wheezing and stridor.   Cardiovascular: Positive for chest pain and leg swelling (chronic). Negative for palpitations.  Gastrointestinal: Negative for abdominal distention, constipation, diarrhea, nausea and vomiting.  Genitourinary: Negative for flank pain.       Does not make urine  Musculoskeletal: Negative for back pain, neck pain and neck stiffness.  Skin: Negative for rash and wound.  Neurological: Negative for dizziness, light-headedness and headaches.  Psychiatric/Behavioral: Negative for agitation and confusion.  All  other systems reviewed and are negative.   Physical Exam Updated Vital Signs BP (!) 171/115 (BP Location: Right Arm)   Pulse 92   Temp 98.2 F (36.8 C) (Oral)   Resp 17   SpO2 96%   Physical Exam Constitutional:      General: He is not in acute distress.    Appearance: He is well-developed. He is not ill-appearing, toxic-appearing or diaphoretic.  HENT:     Head: Normocephalic and atraumatic.     Right Ear: External ear normal.     Left Ear: External ear normal.     Nose: Nose normal.     Mouth/Throat:     Mouth: Mucous membranes are moist.     Pharynx: No pharyngeal swelling or oropharyngeal exudate.  Eyes:     Conjunctiva/sclera: Conjunctivae normal.     Pupils: Pupils are equal, round, and reactive to light.  Cardiovascular:     Rate and Rhythm: Normal rate.  No extrasystoles are present.    Heart sounds: No murmur.  Pulmonary:     Effort: No tachypnea or respiratory distress.     Breath sounds: No stridor. Rales present. No decreased breath sounds, wheezing or rhonchi.  Chest:     Chest wall: No tenderness.  Abdominal:     Palpations: Abdomen is soft.     Tenderness: There is no abdominal tenderness. There is no guarding or rebound.  Musculoskeletal:     Cervical back: Normal range of motion and neck supple.     Right lower leg: No tenderness. Edema present.     Left lower leg: No tenderness. Edema present.  Skin:    General: Skin is warm.     Findings: No erythema or rash.  Neurological:     General: No focal deficit present.     Mental Status: He is alert and oriented to person, place, and time.     Cranial Nerves: No cranial nerve deficit.     Motor: No abnormal muscle tone.     Coordination: Coordination normal.     Deep Tendon Reflexes: Reflexes normal.  Psychiatric:        Mood and Affect: Mood normal.     ED Results / Procedures / Treatments   Labs (all labs ordered are listed, but only abnormal results are displayed) Labs Reviewed  BASIC  METABOLIC PANEL - Abnormal; Notable for the following components:      Result Value  Chloride 93 (*)    Glucose, Bld 105 (*)    BUN 43 (*)    Creatinine, Ser 9.39 (*)    Calcium 8.6 (*)    GFR calc non Af Amer 6 (*)    GFR calc Af Amer 7 (*)    Anion gap 17 (*)    All other components within normal limits  CBC - Abnormal; Notable for the following components:   RBC 2.51 (*)    Hemoglobin 8.3 (*)    HCT 27.3 (*)    MCV 108.8 (*)    RDW 19.8 (*)    All other components within normal limits  CBC - Abnormal; Notable for the following components:   RBC 2.56 (*)    Hemoglobin 8.5 (*)    HCT 27.5 (*)    MCV 107.4 (*)    RDW 19.8 (*)    All other components within normal limits  TROPONIN I (HIGH SENSITIVITY) - Abnormal; Notable for the following components:   Troponin I (High Sensitivity) 33 (*)    All other components within normal limits  TROPONIN I (HIGH SENSITIVITY) - Abnormal; Notable for the following components:   Troponin I (High Sensitivity) 31 (*)    All other components within normal limits  TYPE AND SCREEN    EKG EKG Interpretation  Date/Time:  Thursday July 25 2019 07:09:37 EST Ventricular Rate:  93 PR Interval:  178 QRS Duration: 80 QT Interval:  374 QTC Calculation: 465 R Axis:   -54 Text Interpretation: Sinus rhythm with Premature atrial complexes with Abberant conduction Possible Left atrial enlargement Left axis deviation Minimal voltage criteria for LVH, may be normal variant ( Cornell product ) Inferior infarct , age undetermined Anterior infarct , age undetermined Abnormal ECG When comapred to prior, significant artifact now present. No STEMI Confirmed by Antony Blackbird [phone]) on 07/25/2019 7:45:52 AM   Radiology DG Chest 2 View  Result Date: 07/25/2019 CLINICAL DATA:  Shortness of breath. Chronic renal failure EXAM: CHEST - 2 VIEW COMPARISON:  08/04/2016 FINDINGS: The heart is upper limits of normal in size. The mediastinal and hilar contours are  within normal limits and stable. Mild central vascular congestion and increased perihilar interstitial markings and mild peribronchial thickening suggesting perihilar interstitial edema. No focal infiltrates or pleural effusions. IMPRESSION: Central vascular congestion and probable mild perihilar pattern of interstitial edema. No infiltrates or effusions. Electronically Signed   By: Marijo Sanes M.D.   On: 07/25/2019 07:48    Procedures Procedures (including critical care time)  Medications Ordered in ED Medications - No data to display  ED Course  I have reviewed the triage vital signs and the nursing notes.  Pertinent labs & imaging results that were available during my care of the patient were reviewed by me and considered in my medical decision making (see chart for details).    MDM Rules/Calculators/A&P                      Jeremy Mccoy is a 54 y.o. male with a past medical history significant for ESRD with dialysis Monday Wednesday Friday, prior substance abuse, seizure disorder, paroxysmal atrial fibrillation, prior anemia, CHF, and COPD who presents at the direction of his nephrology team for evaluation of possible recurrent anemia.  According to patient, the last 2 weeks he has had gradually worsening exertional shortness of breath and fatigue.  He reports he is also chest pain for the last 3 days that seems to be exertional  in nature and is central chest and location.  Does not radiate.  He denies nausea vomiting, fevers, chills, ingestion, cough.  Denies any new leg pain.  He does report some mild chronic leg swelling which is similar to his baseline.  He denies any productive cough.  No hemoptysis.  He reports his chest pressure is very mild and is currently absent.  He had dialysis yesterday and reports that his hemoglobin was found to be 7 and he was told to come to the emergency department today for evaluation and possible plan transfusion.  On exam, lungs have rales but no  rhonchi.  No wheezing.  Chest and abdomen are nontender.  Good pulses in all extremities.  Legs are slightly edematous but nontender.  Patient resting comfortably on room air with oxygen saturations in the upper 90s.  Had a shared decision conversation with patient and we agreed to get some blood work and reassess the patient.  Patient reports that previously he has been told he was anemic, sent to the ED, it was found to be just above a transfusion cut off and then was sent back home only to have it low again and have to be recent back.  EKG showed lots of artifact but no acute STEMI.  Chest x-ray shows pulmonary edema but no consolidation or large effusion.  No pneumothorax.  Laboratory testing began to return and his hemoglobin was found to be 8.3.  Given his report that it tends to be higher initially before dropping, we agreed to get a repeat CBC while he is waiting on a delta troponin.  Initial troponin was slightly elevated at 33.  Patient was ambulated with pulse oximetry and he did not have hypoxia or significant increase in chest pain or shortness of breath.    Anticipate reassessment after work-up.   11:42 AM Patient's work-up is returned and his hemoglobin is actually improving again.  It is now 8.5.  Troponin was stable and not increasing.  Patient ambulated without difficulty and we had a discussion.  He is amenable to going home and having dialysis tomorrow for the fluid overload.  Do not feel need emergent dialysis as his potassium is normal and he is not hypoxic.  Patient agrees with this.  Patient be discharged for outpatient follow-up.  He understands return precautions and was discharged in good condition.    Final Clinical Impression(s) / ED Diagnoses Final diagnoses:  Shortness of breath  Atypical chest pain    Rx / DC Orders ED Discharge Orders    None      Clinical Impression: 1. Shortness of breath   2. Atypical chest pain     Disposition:  Discharge  Condition: Good  I have discussed the results, Dx and Tx plan with the pt(& family if present). He/she/they expressed understanding and agree(s) with the plan. Discharge instructions discussed at great length. Strict return precautions discussed and pt &/or family have verbalized understanding of the instructions. No further questions at time of discharge.    New Prescriptions   No medications on file    Follow Up: Go to your PCP and your Dialyisis tomorrow.     [place] [address] [id number] mc [place]       Suhayla Chisom, Gwenyth Allegra, MD 07/25/19 1145

## 2019-07-25 NOTE — ED Notes (Signed)
Pt ambulatory independently, SpO2 remained between 95% and 100% before, during, and after ambulating.

## 2019-07-25 NOTE — ED Notes (Signed)
Patient verbalizes understanding of discharge instructions. Opportunity for questioning and answers were provided. Armband removed by staff, pt discharged from ED.  

## 2019-07-26 DIAGNOSIS — N186 End stage renal disease: Secondary | ICD-10-CM | POA: Diagnosis not present

## 2019-07-26 DIAGNOSIS — N2581 Secondary hyperparathyroidism of renal origin: Secondary | ICD-10-CM | POA: Diagnosis not present

## 2019-07-26 DIAGNOSIS — Z992 Dependence on renal dialysis: Secondary | ICD-10-CM | POA: Diagnosis not present

## 2019-07-26 LAB — TYPE AND SCREEN
ABO/RH(D): O POS
Antibody Screen: NEGATIVE
Unit division: 0
Unit division: 0

## 2019-07-26 LAB — BPAM RBC
Blood Product Expiration Date: 202104082359
Blood Product Expiration Date: 202104112359
Unit Type and Rh: 5100
Unit Type and Rh: 5100

## 2019-07-29 DIAGNOSIS — Z992 Dependence on renal dialysis: Secondary | ICD-10-CM | POA: Diagnosis not present

## 2019-07-29 DIAGNOSIS — N2581 Secondary hyperparathyroidism of renal origin: Secondary | ICD-10-CM | POA: Diagnosis not present

## 2019-07-29 DIAGNOSIS — N186 End stage renal disease: Secondary | ICD-10-CM | POA: Diagnosis not present

## 2019-08-01 DIAGNOSIS — N186 End stage renal disease: Secondary | ICD-10-CM | POA: Diagnosis not present

## 2019-08-01 DIAGNOSIS — Z992 Dependence on renal dialysis: Secondary | ICD-10-CM | POA: Diagnosis not present

## 2019-08-01 DIAGNOSIS — N2581 Secondary hyperparathyroidism of renal origin: Secondary | ICD-10-CM | POA: Diagnosis not present

## 2019-08-02 DIAGNOSIS — N186 End stage renal disease: Secondary | ICD-10-CM | POA: Diagnosis not present

## 2019-08-02 DIAGNOSIS — Z992 Dependence on renal dialysis: Secondary | ICD-10-CM | POA: Diagnosis not present

## 2019-08-02 DIAGNOSIS — N2581 Secondary hyperparathyroidism of renal origin: Secondary | ICD-10-CM | POA: Diagnosis not present

## 2019-08-07 DIAGNOSIS — Z992 Dependence on renal dialysis: Secondary | ICD-10-CM | POA: Diagnosis not present

## 2019-08-07 DIAGNOSIS — N186 End stage renal disease: Secondary | ICD-10-CM | POA: Diagnosis not present

## 2019-08-07 DIAGNOSIS — N2581 Secondary hyperparathyroidism of renal origin: Secondary | ICD-10-CM | POA: Diagnosis not present

## 2019-08-09 DIAGNOSIS — N186 End stage renal disease: Secondary | ICD-10-CM | POA: Diagnosis not present

## 2019-08-09 DIAGNOSIS — N2581 Secondary hyperparathyroidism of renal origin: Secondary | ICD-10-CM | POA: Diagnosis not present

## 2019-08-09 DIAGNOSIS — Z992 Dependence on renal dialysis: Secondary | ICD-10-CM | POA: Diagnosis not present

## 2019-08-12 DIAGNOSIS — N186 End stage renal disease: Secondary | ICD-10-CM | POA: Diagnosis not present

## 2019-08-12 DIAGNOSIS — N2581 Secondary hyperparathyroidism of renal origin: Secondary | ICD-10-CM | POA: Diagnosis not present

## 2019-08-12 DIAGNOSIS — Z992 Dependence on renal dialysis: Secondary | ICD-10-CM | POA: Diagnosis not present

## 2019-08-14 DIAGNOSIS — I10 Essential (primary) hypertension: Secondary | ICD-10-CM | POA: Diagnosis not present

## 2019-08-14 DIAGNOSIS — N2581 Secondary hyperparathyroidism of renal origin: Secondary | ICD-10-CM | POA: Diagnosis not present

## 2019-08-14 DIAGNOSIS — N186 End stage renal disease: Secondary | ICD-10-CM | POA: Diagnosis not present

## 2019-08-14 DIAGNOSIS — Z992 Dependence on renal dialysis: Secondary | ICD-10-CM | POA: Diagnosis not present

## 2019-08-19 DIAGNOSIS — Z992 Dependence on renal dialysis: Secondary | ICD-10-CM | POA: Diagnosis not present

## 2019-08-19 DIAGNOSIS — N2581 Secondary hyperparathyroidism of renal origin: Secondary | ICD-10-CM | POA: Diagnosis not present

## 2019-08-19 DIAGNOSIS — N186 End stage renal disease: Secondary | ICD-10-CM | POA: Diagnosis not present

## 2019-08-21 DIAGNOSIS — Z992 Dependence on renal dialysis: Secondary | ICD-10-CM | POA: Diagnosis not present

## 2019-08-21 DIAGNOSIS — N186 End stage renal disease: Secondary | ICD-10-CM | POA: Diagnosis not present

## 2019-08-21 DIAGNOSIS — N2581 Secondary hyperparathyroidism of renal origin: Secondary | ICD-10-CM | POA: Diagnosis not present

## 2019-08-23 DIAGNOSIS — N186 End stage renal disease: Secondary | ICD-10-CM | POA: Diagnosis not present

## 2019-08-23 DIAGNOSIS — Z992 Dependence on renal dialysis: Secondary | ICD-10-CM | POA: Diagnosis not present

## 2019-08-23 DIAGNOSIS — N2581 Secondary hyperparathyroidism of renal origin: Secondary | ICD-10-CM | POA: Diagnosis not present

## 2019-08-26 DIAGNOSIS — Z992 Dependence on renal dialysis: Secondary | ICD-10-CM | POA: Diagnosis not present

## 2019-08-26 DIAGNOSIS — N2581 Secondary hyperparathyroidism of renal origin: Secondary | ICD-10-CM | POA: Diagnosis not present

## 2019-08-26 DIAGNOSIS — N186 End stage renal disease: Secondary | ICD-10-CM | POA: Diagnosis not present

## 2019-08-28 DIAGNOSIS — N186 End stage renal disease: Secondary | ICD-10-CM | POA: Diagnosis not present

## 2019-08-28 DIAGNOSIS — Z992 Dependence on renal dialysis: Secondary | ICD-10-CM | POA: Diagnosis not present

## 2019-08-28 DIAGNOSIS — N2581 Secondary hyperparathyroidism of renal origin: Secondary | ICD-10-CM | POA: Diagnosis not present

## 2019-08-30 DIAGNOSIS — N2581 Secondary hyperparathyroidism of renal origin: Secondary | ICD-10-CM | POA: Diagnosis not present

## 2019-08-30 DIAGNOSIS — N186 End stage renal disease: Secondary | ICD-10-CM | POA: Diagnosis not present

## 2019-08-30 DIAGNOSIS — Z992 Dependence on renal dialysis: Secondary | ICD-10-CM | POA: Diagnosis not present

## 2019-09-02 DIAGNOSIS — Z992 Dependence on renal dialysis: Secondary | ICD-10-CM | POA: Diagnosis not present

## 2019-09-02 DIAGNOSIS — N2581 Secondary hyperparathyroidism of renal origin: Secondary | ICD-10-CM | POA: Diagnosis not present

## 2019-09-02 DIAGNOSIS — N186 End stage renal disease: Secondary | ICD-10-CM | POA: Diagnosis not present

## 2019-09-04 DIAGNOSIS — N2581 Secondary hyperparathyroidism of renal origin: Secondary | ICD-10-CM | POA: Diagnosis not present

## 2019-09-04 DIAGNOSIS — N186 End stage renal disease: Secondary | ICD-10-CM | POA: Diagnosis not present

## 2019-09-04 DIAGNOSIS — Z992 Dependence on renal dialysis: Secondary | ICD-10-CM | POA: Diagnosis not present

## 2019-09-06 DIAGNOSIS — Z992 Dependence on renal dialysis: Secondary | ICD-10-CM | POA: Diagnosis not present

## 2019-09-06 DIAGNOSIS — N186 End stage renal disease: Secondary | ICD-10-CM | POA: Diagnosis not present

## 2019-09-06 DIAGNOSIS — N2581 Secondary hyperparathyroidism of renal origin: Secondary | ICD-10-CM | POA: Diagnosis not present

## 2019-09-09 DIAGNOSIS — Z992 Dependence on renal dialysis: Secondary | ICD-10-CM | POA: Diagnosis not present

## 2019-09-09 DIAGNOSIS — N2581 Secondary hyperparathyroidism of renal origin: Secondary | ICD-10-CM | POA: Diagnosis not present

## 2019-09-09 DIAGNOSIS — N186 End stage renal disease: Secondary | ICD-10-CM | POA: Diagnosis not present

## 2019-09-11 DIAGNOSIS — N2581 Secondary hyperparathyroidism of renal origin: Secondary | ICD-10-CM | POA: Diagnosis not present

## 2019-09-11 DIAGNOSIS — N186 End stage renal disease: Secondary | ICD-10-CM | POA: Diagnosis not present

## 2019-09-11 DIAGNOSIS — Z992 Dependence on renal dialysis: Secondary | ICD-10-CM | POA: Diagnosis not present

## 2019-09-13 DIAGNOSIS — Z992 Dependence on renal dialysis: Secondary | ICD-10-CM | POA: Diagnosis not present

## 2019-09-13 DIAGNOSIS — N2581 Secondary hyperparathyroidism of renal origin: Secondary | ICD-10-CM | POA: Diagnosis not present

## 2019-09-13 DIAGNOSIS — I10 Essential (primary) hypertension: Secondary | ICD-10-CM | POA: Diagnosis not present

## 2019-09-13 DIAGNOSIS — N186 End stage renal disease: Secondary | ICD-10-CM | POA: Diagnosis not present

## 2019-09-16 DIAGNOSIS — N186 End stage renal disease: Secondary | ICD-10-CM | POA: Diagnosis not present

## 2019-09-16 DIAGNOSIS — Z992 Dependence on renal dialysis: Secondary | ICD-10-CM | POA: Diagnosis not present

## 2019-09-16 DIAGNOSIS — N2581 Secondary hyperparathyroidism of renal origin: Secondary | ICD-10-CM | POA: Diagnosis not present

## 2019-09-18 DIAGNOSIS — Z992 Dependence on renal dialysis: Secondary | ICD-10-CM | POA: Diagnosis not present

## 2019-09-18 DIAGNOSIS — N186 End stage renal disease: Secondary | ICD-10-CM | POA: Diagnosis not present

## 2019-09-18 DIAGNOSIS — N2581 Secondary hyperparathyroidism of renal origin: Secondary | ICD-10-CM | POA: Diagnosis not present

## 2019-09-21 DIAGNOSIS — N2581 Secondary hyperparathyroidism of renal origin: Secondary | ICD-10-CM | POA: Diagnosis not present

## 2019-09-21 DIAGNOSIS — N186 End stage renal disease: Secondary | ICD-10-CM | POA: Diagnosis not present

## 2019-09-21 DIAGNOSIS — Z992 Dependence on renal dialysis: Secondary | ICD-10-CM | POA: Diagnosis not present

## 2019-09-23 DIAGNOSIS — N186 End stage renal disease: Secondary | ICD-10-CM | POA: Diagnosis not present

## 2019-09-23 DIAGNOSIS — N2581 Secondary hyperparathyroidism of renal origin: Secondary | ICD-10-CM | POA: Diagnosis not present

## 2019-09-23 DIAGNOSIS — Z992 Dependence on renal dialysis: Secondary | ICD-10-CM | POA: Diagnosis not present

## 2019-09-26 DIAGNOSIS — N2581 Secondary hyperparathyroidism of renal origin: Secondary | ICD-10-CM | POA: Diagnosis not present

## 2019-09-26 DIAGNOSIS — Z992 Dependence on renal dialysis: Secondary | ICD-10-CM | POA: Diagnosis not present

## 2019-09-26 DIAGNOSIS — N186 End stage renal disease: Secondary | ICD-10-CM | POA: Diagnosis not present

## 2019-09-28 DIAGNOSIS — N186 End stage renal disease: Secondary | ICD-10-CM | POA: Diagnosis not present

## 2019-09-28 DIAGNOSIS — Z992 Dependence on renal dialysis: Secondary | ICD-10-CM | POA: Diagnosis not present

## 2019-09-28 DIAGNOSIS — N2581 Secondary hyperparathyroidism of renal origin: Secondary | ICD-10-CM | POA: Diagnosis not present

## 2019-09-30 DIAGNOSIS — N186 End stage renal disease: Secondary | ICD-10-CM | POA: Diagnosis not present

## 2019-09-30 DIAGNOSIS — Z992 Dependence on renal dialysis: Secondary | ICD-10-CM | POA: Diagnosis not present

## 2019-09-30 DIAGNOSIS — N2581 Secondary hyperparathyroidism of renal origin: Secondary | ICD-10-CM | POA: Diagnosis not present

## 2019-10-02 DIAGNOSIS — N2581 Secondary hyperparathyroidism of renal origin: Secondary | ICD-10-CM | POA: Diagnosis not present

## 2019-10-02 DIAGNOSIS — Z992 Dependence on renal dialysis: Secondary | ICD-10-CM | POA: Diagnosis not present

## 2019-10-02 DIAGNOSIS — N186 End stage renal disease: Secondary | ICD-10-CM | POA: Diagnosis not present

## 2019-10-05 DIAGNOSIS — N2581 Secondary hyperparathyroidism of renal origin: Secondary | ICD-10-CM | POA: Diagnosis not present

## 2019-10-05 DIAGNOSIS — Z992 Dependence on renal dialysis: Secondary | ICD-10-CM | POA: Diagnosis not present

## 2019-10-05 DIAGNOSIS — N186 End stage renal disease: Secondary | ICD-10-CM | POA: Diagnosis not present

## 2019-10-07 DIAGNOSIS — N2581 Secondary hyperparathyroidism of renal origin: Secondary | ICD-10-CM | POA: Diagnosis not present

## 2019-10-07 DIAGNOSIS — N186 End stage renal disease: Secondary | ICD-10-CM | POA: Diagnosis not present

## 2019-10-07 DIAGNOSIS — Z992 Dependence on renal dialysis: Secondary | ICD-10-CM | POA: Diagnosis not present

## 2019-10-09 DIAGNOSIS — Z992 Dependence on renal dialysis: Secondary | ICD-10-CM | POA: Diagnosis not present

## 2019-10-09 DIAGNOSIS — N2581 Secondary hyperparathyroidism of renal origin: Secondary | ICD-10-CM | POA: Diagnosis not present

## 2019-10-09 DIAGNOSIS — N186 End stage renal disease: Secondary | ICD-10-CM | POA: Diagnosis not present

## 2019-10-11 DIAGNOSIS — N2581 Secondary hyperparathyroidism of renal origin: Secondary | ICD-10-CM | POA: Diagnosis not present

## 2019-10-11 DIAGNOSIS — Z992 Dependence on renal dialysis: Secondary | ICD-10-CM | POA: Diagnosis not present

## 2019-10-11 DIAGNOSIS — N186 End stage renal disease: Secondary | ICD-10-CM | POA: Diagnosis not present

## 2019-10-14 DIAGNOSIS — N186 End stage renal disease: Secondary | ICD-10-CM | POA: Diagnosis not present

## 2019-10-14 DIAGNOSIS — I10 Essential (primary) hypertension: Secondary | ICD-10-CM | POA: Diagnosis not present

## 2019-10-14 DIAGNOSIS — Z992 Dependence on renal dialysis: Secondary | ICD-10-CM | POA: Diagnosis not present

## 2019-10-14 DIAGNOSIS — N2581 Secondary hyperparathyroidism of renal origin: Secondary | ICD-10-CM | POA: Diagnosis not present

## 2019-10-16 DIAGNOSIS — N186 End stage renal disease: Secondary | ICD-10-CM | POA: Diagnosis not present

## 2019-10-16 DIAGNOSIS — Z992 Dependence on renal dialysis: Secondary | ICD-10-CM | POA: Diagnosis not present

## 2019-10-16 DIAGNOSIS — N2581 Secondary hyperparathyroidism of renal origin: Secondary | ICD-10-CM | POA: Diagnosis not present

## 2019-10-18 DIAGNOSIS — Z992 Dependence on renal dialysis: Secondary | ICD-10-CM | POA: Diagnosis not present

## 2019-10-18 DIAGNOSIS — N186 End stage renal disease: Secondary | ICD-10-CM | POA: Diagnosis not present

## 2019-10-18 DIAGNOSIS — N2581 Secondary hyperparathyroidism of renal origin: Secondary | ICD-10-CM | POA: Diagnosis not present

## 2019-10-21 DIAGNOSIS — N186 End stage renal disease: Secondary | ICD-10-CM | POA: Diagnosis not present

## 2019-10-21 DIAGNOSIS — Z992 Dependence on renal dialysis: Secondary | ICD-10-CM | POA: Diagnosis not present

## 2019-10-21 DIAGNOSIS — N2581 Secondary hyperparathyroidism of renal origin: Secondary | ICD-10-CM | POA: Diagnosis not present

## 2019-10-23 DIAGNOSIS — N2581 Secondary hyperparathyroidism of renal origin: Secondary | ICD-10-CM | POA: Diagnosis not present

## 2019-10-23 DIAGNOSIS — Z992 Dependence on renal dialysis: Secondary | ICD-10-CM | POA: Diagnosis not present

## 2019-10-23 DIAGNOSIS — N186 End stage renal disease: Secondary | ICD-10-CM | POA: Diagnosis not present

## 2019-10-25 DIAGNOSIS — N2581 Secondary hyperparathyroidism of renal origin: Secondary | ICD-10-CM | POA: Diagnosis not present

## 2019-10-25 DIAGNOSIS — N186 End stage renal disease: Secondary | ICD-10-CM | POA: Diagnosis not present

## 2019-10-25 DIAGNOSIS — Z992 Dependence on renal dialysis: Secondary | ICD-10-CM | POA: Diagnosis not present

## 2019-10-28 DIAGNOSIS — Z992 Dependence on renal dialysis: Secondary | ICD-10-CM | POA: Diagnosis not present

## 2019-10-28 DIAGNOSIS — N2581 Secondary hyperparathyroidism of renal origin: Secondary | ICD-10-CM | POA: Diagnosis not present

## 2019-10-28 DIAGNOSIS — N186 End stage renal disease: Secondary | ICD-10-CM | POA: Diagnosis not present

## 2019-10-30 DIAGNOSIS — N186 End stage renal disease: Secondary | ICD-10-CM | POA: Diagnosis not present

## 2019-10-30 DIAGNOSIS — Z992 Dependence on renal dialysis: Secondary | ICD-10-CM | POA: Diagnosis not present

## 2019-10-30 DIAGNOSIS — N2581 Secondary hyperparathyroidism of renal origin: Secondary | ICD-10-CM | POA: Diagnosis not present

## 2019-11-01 DIAGNOSIS — N186 End stage renal disease: Secondary | ICD-10-CM | POA: Diagnosis not present

## 2019-11-01 DIAGNOSIS — N2581 Secondary hyperparathyroidism of renal origin: Secondary | ICD-10-CM | POA: Diagnosis not present

## 2019-11-01 DIAGNOSIS — Z992 Dependence on renal dialysis: Secondary | ICD-10-CM | POA: Diagnosis not present

## 2019-11-04 DIAGNOSIS — N2581 Secondary hyperparathyroidism of renal origin: Secondary | ICD-10-CM | POA: Diagnosis not present

## 2019-11-04 DIAGNOSIS — Z992 Dependence on renal dialysis: Secondary | ICD-10-CM | POA: Diagnosis not present

## 2019-11-04 DIAGNOSIS — N186 End stage renal disease: Secondary | ICD-10-CM | POA: Diagnosis not present

## 2019-11-07 DIAGNOSIS — N186 End stage renal disease: Secondary | ICD-10-CM | POA: Diagnosis not present

## 2019-11-07 DIAGNOSIS — Z992 Dependence on renal dialysis: Secondary | ICD-10-CM | POA: Diagnosis not present

## 2019-11-07 DIAGNOSIS — N2581 Secondary hyperparathyroidism of renal origin: Secondary | ICD-10-CM | POA: Diagnosis not present

## 2019-11-08 DIAGNOSIS — N186 End stage renal disease: Secondary | ICD-10-CM | POA: Diagnosis not present

## 2019-11-08 DIAGNOSIS — N2581 Secondary hyperparathyroidism of renal origin: Secondary | ICD-10-CM | POA: Diagnosis not present

## 2019-11-08 DIAGNOSIS — Z992 Dependence on renal dialysis: Secondary | ICD-10-CM | POA: Diagnosis not present

## 2019-11-12 DIAGNOSIS — N186 End stage renal disease: Secondary | ICD-10-CM | POA: Diagnosis not present

## 2019-11-12 DIAGNOSIS — Z992 Dependence on renal dialysis: Secondary | ICD-10-CM | POA: Diagnosis not present

## 2019-11-12 DIAGNOSIS — N2581 Secondary hyperparathyroidism of renal origin: Secondary | ICD-10-CM | POA: Diagnosis not present

## 2019-11-13 DIAGNOSIS — I10 Essential (primary) hypertension: Secondary | ICD-10-CM | POA: Diagnosis not present

## 2019-11-13 DIAGNOSIS — N2581 Secondary hyperparathyroidism of renal origin: Secondary | ICD-10-CM | POA: Diagnosis not present

## 2019-11-13 DIAGNOSIS — Z992 Dependence on renal dialysis: Secondary | ICD-10-CM | POA: Diagnosis not present

## 2019-11-13 DIAGNOSIS — N186 End stage renal disease: Secondary | ICD-10-CM | POA: Diagnosis not present

## 2019-11-15 DIAGNOSIS — N186 End stage renal disease: Secondary | ICD-10-CM | POA: Diagnosis not present

## 2019-11-15 DIAGNOSIS — N2581 Secondary hyperparathyroidism of renal origin: Secondary | ICD-10-CM | POA: Diagnosis not present

## 2019-11-15 DIAGNOSIS — Z992 Dependence on renal dialysis: Secondary | ICD-10-CM | POA: Diagnosis not present

## 2019-11-18 DIAGNOSIS — Z992 Dependence on renal dialysis: Secondary | ICD-10-CM | POA: Diagnosis not present

## 2019-11-18 DIAGNOSIS — N2581 Secondary hyperparathyroidism of renal origin: Secondary | ICD-10-CM | POA: Diagnosis not present

## 2019-11-18 DIAGNOSIS — N186 End stage renal disease: Secondary | ICD-10-CM | POA: Diagnosis not present

## 2019-11-19 ENCOUNTER — Encounter (HOSPITAL_COMMUNITY): Payer: Medicare HMO

## 2019-11-20 DIAGNOSIS — N2581 Secondary hyperparathyroidism of renal origin: Secondary | ICD-10-CM | POA: Diagnosis not present

## 2019-11-20 DIAGNOSIS — N186 End stage renal disease: Secondary | ICD-10-CM | POA: Diagnosis not present

## 2019-11-20 DIAGNOSIS — Z992 Dependence on renal dialysis: Secondary | ICD-10-CM | POA: Diagnosis not present

## 2019-11-22 DIAGNOSIS — N2581 Secondary hyperparathyroidism of renal origin: Secondary | ICD-10-CM | POA: Diagnosis not present

## 2019-11-22 DIAGNOSIS — N186 End stage renal disease: Secondary | ICD-10-CM | POA: Diagnosis not present

## 2019-11-22 DIAGNOSIS — Z992 Dependence on renal dialysis: Secondary | ICD-10-CM | POA: Diagnosis not present

## 2019-11-25 DIAGNOSIS — N2581 Secondary hyperparathyroidism of renal origin: Secondary | ICD-10-CM | POA: Diagnosis not present

## 2019-11-25 DIAGNOSIS — N186 End stage renal disease: Secondary | ICD-10-CM | POA: Diagnosis not present

## 2019-11-25 DIAGNOSIS — Z992 Dependence on renal dialysis: Secondary | ICD-10-CM | POA: Diagnosis not present

## 2019-11-26 ENCOUNTER — Ambulatory Visit (HOSPITAL_COMMUNITY)
Admission: RE | Admit: 2019-11-26 | Discharge: 2019-11-26 | Disposition: A | Discharge status: Home / Self Care | Payer: Medicare HMO | Source: Ambulatory Visit | Attending: Internal Medicine | Admitting: Internal Medicine

## 2019-11-26 ENCOUNTER — Other Ambulatory Visit: Payer: Self-pay

## 2019-11-26 DIAGNOSIS — N186 End stage renal disease: Secondary | ICD-10-CM | POA: Diagnosis not present

## 2019-11-26 LAB — PREPARE RBC (CROSSMATCH)

## 2019-11-26 MED ORDER — SODIUM CHLORIDE 0.9% IV SOLUTION: Freq: Once | INTRAVENOUS | Status: DC

## 2019-11-27 ENCOUNTER — Ambulatory Visit (HOSPITAL_COMMUNITY)
Admission: RE | Admit: 2019-11-27 | Discharge: 2019-11-27 | Disposition: A | Discharge status: Home / Self Care | Payer: Medicare HMO | Source: Ambulatory Visit | Attending: Internal Medicine | Admitting: Internal Medicine

## 2019-11-27 DIAGNOSIS — N186 End stage renal disease: Secondary | ICD-10-CM | POA: Diagnosis not present

## 2019-11-27 DIAGNOSIS — N2581 Secondary hyperparathyroidism of renal origin: Secondary | ICD-10-CM | POA: Diagnosis not present

## 2019-11-27 DIAGNOSIS — Z992 Dependence on renal dialysis: Secondary | ICD-10-CM | POA: Diagnosis not present

## 2019-11-27 MED ORDER — SODIUM CHLORIDE 0.9% IV SOLUTION: Freq: Once | INTRAVENOUS | Status: AC

## 2019-11-27 NOTE — Progress Notes (Signed)
Patient received 2 units of packed red blood cells as ordered by Corliss Parish MD via a PIV. Pre transfusion BP was 155/87. RN contacted the MD's office, per Meyer Cory, it was okay to transfuse the blood. Patient tolerated well, vitals stable, discharge instructions given, verbalized understanding. Patient alert, oriented and ambulatory at the time of discharge.

## 2019-11-27 NOTE — Discharge Instructions (Signed)

## 2019-11-28 LAB — TYPE AND SCREEN
ABO/RH(D): O POS
Antibody Screen: NEGATIVE
Unit division: 0
Unit division: 0

## 2019-11-28 LAB — BPAM RBC
Blood Product Expiration Date: 202108032359
Blood Product Expiration Date: 202108032359
ISSUE DATE / TIME: 202107140810
ISSUE DATE / TIME: 202107140810
Unit Type and Rh: 5100
Unit Type and Rh: 5100

## 2019-11-29 DIAGNOSIS — N2581 Secondary hyperparathyroidism of renal origin: Secondary | ICD-10-CM | POA: Diagnosis not present

## 2019-11-29 DIAGNOSIS — N186 End stage renal disease: Secondary | ICD-10-CM | POA: Diagnosis not present

## 2019-11-29 DIAGNOSIS — Z992 Dependence on renal dialysis: Secondary | ICD-10-CM | POA: Diagnosis not present

## 2019-12-02 DIAGNOSIS — N186 End stage renal disease: Secondary | ICD-10-CM | POA: Diagnosis not present

## 2019-12-02 DIAGNOSIS — N2581 Secondary hyperparathyroidism of renal origin: Secondary | ICD-10-CM | POA: Diagnosis not present

## 2019-12-02 DIAGNOSIS — Z992 Dependence on renal dialysis: Secondary | ICD-10-CM | POA: Diagnosis not present

## 2019-12-03 ENCOUNTER — Other Ambulatory Visit: Payer: Self-pay

## 2019-12-03 ENCOUNTER — Ambulatory Visit (INDEPENDENT_AMBULATORY_CARE_PROVIDER_SITE_OTHER): Payer: Medicare HMO | Admitting: Physician Assistant

## 2019-12-03 VITALS — BP 150/88 | HR 103 | Temp 98.3°F | Resp 20 | Ht 72.0 in | Wt 153.7 lb

## 2019-12-03 DIAGNOSIS — N186 End stage renal disease: Secondary | ICD-10-CM | POA: Diagnosis not present

## 2019-12-03 DIAGNOSIS — Z992 Dependence on renal dialysis: Secondary | ICD-10-CM | POA: Diagnosis not present

## 2019-12-03 NOTE — Progress Notes (Signed)
Established Dialysis Access   History of Present Illness   Jeremy Mccoy is a 54 y.o. (01/28/1966) male who presents at the request of Dr. Moshe Mccoy for some thinning over his left radiocephalic AV fistula. The patients fistula was initially placed by Dr. Scot Mccoy in July of 2013. We have not seen him in follow up since his initial post operative visit in August of 2013. He has had several IR thrombectomies and also some fistulogram's performed at [facility] over the past 8 years. He says recently he has not had any issues with the fistula. It is functioning well. He has no steal symptoms. He denies any bleeding, difficulty with sticks at dialysis, prolonged dialysis times or issues with swelling of his left upper extremity.   Current Outpatient Medications  Medication Sig Dispense Refill  . acetaminophen (TYLENOL) 500 MG tablet Take 1 tablet (500 mg total) by mouth every 8 (eight) hours as needed for mild pain or fever. 6 times a day, 500 mg, 12 to 24 tabs a day, 6,000 to 12,000 mg daily for pain (Patient taking differently: Take 500 mg by mouth every 8 (eight) hours as needed for mild pain or fever. ) 30 tablet 0  . amLODipine (NORVASC) 10 MG tablet Take 1 tablet (10 mg total) by mouth daily. 30 tablet 1  . aspirin 81 MG chewable tablet Chew 1 tablet (81 mg total) by mouth daily. 30 tablet 11  . B Complex-C-Zn-Folic Acid (DIALYVITE/ZINC) TABS Take by mouth.    . calcium carbonate (OS-CAL) 1250 (500 Ca) MG chewable tablet Chew by mouth.    . cinacalcet (SENSIPAR) 90 MG tablet     . cloNIDine (CATAPRES) 0.3 MG tablet Take 0.3 mg by mouth 3 (three) times daily.     . Diphenhydramine-APAP 12.5-325 MG TABS Take by mouth.    . doxercalciferol (HECTOROL) 0.5 MCG capsule Doxercalciferol (Hectorol)    . Etelcalcetide HCl (PARSABIV IV) Etelcalcetide (Parsabiv)    . hydrALAZINE (APRESOLINE) 25 MG tablet Take 25 mg by mouth 3 (three) times daily.    . hydrOXYzine (ATARAX/VISTARIL) 25 MG tablet  Take 25 mg by mouth 3 (three) times daily as needed for itching.   6  . iron sucrose in sodium chloride 0.9 % 100 mL Iron Sucrose (Venofer)    . Nutritional Supplements (FEEDING SUPPLEMENT, NEPRO CARB STEADY,) LIQD Take 237 mLs by mouth 3 (three) times daily as needed (Supplement).  0  . pantoprazole (PROTONIX) 20 MG tablet Take 20 mg by mouth daily.    . patiromer (VELTASSA) 8.4 g packet Take by mouth.    . polyethylene glycol (MIRALAX / GLYCOLAX) packet Take 17 g by mouth daily as needed for mild constipation. 14 each 0  . ranitidine (ZANTAC) 300 MG tablet Take 300 mg by mouth daily as needed for heartburn.    . sevelamer carbonate (RENVELA) 800 MG tablet Take 2 tablets (1,600 mg total) by mouth 3 (three) times daily with meals. (Patient taking differently: Take 2,400 mg by mouth 3 (three) times daily with meals. And with snacks) 90 tablet 0   No current facility-administered medications for this visit.   ROS reviewed and all systems are negative   Physical Examination   Vitals:   12/03/19 0955  BP: (!) 150/88  Pulse: (!) 103  Resp: 20  Temp: 98.3 F (36.8 C)  TempSrc: Temporal  SpO2: 96%  Weight: 153 lb 11.2 oz (69.7 kg)  Height: 6' (1.829 m)   Body mass index is 20.85  kg/m.  General Well appearing, well nourished, not in any distress  Pulmonary Clear to auscultation bilaterally, equal expansion, no wheezing  Cardiac Regular rate and rhythm  Vascular Vessel Right Left  Radial Palpable Palpable  Brachial Palpable Palpable  Ulnar Palpable Palpable    Left radiocephalic av fistula as shown above. Excellent thrill. Good bruit. 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 There is some thinning of skin over the more proximal and larger aneurysmal area. He has plenty of length of fistula that this area can be avoided for dialsysis    Musculo- Skeletal M/S 5/5 throughout  , Extremities without ischemic changes    Neurologic A&O; CN grossly intact     Medical Decision  Making   Jeremy Mccoy is a 54 y.o. male who presents for follow up of left radiocephalic AV fistula with some thinning over the fistula. The fistula has two aneurysmal areas with thinning over top but it is very minimal. He has had no issues with bleeding. He has plenty of length of vein that could be used to cannulate. I recommend avoiding the aneurysmal areas to allow them to rest and heal for several weeks. I discussed with patient that he may need a plication of these areas in the future if they do not heal or if they become thinner especially if he has issues with bleeding. I have scheduled him a 6-8 week follow up to recheck these areas but in the mean time will discuss with dialysis nurses to avoid the aneurysmal area.  Jeremy Caldwell, PA-C [facility] Office: [phone]  Clinic MD: Jeremy Mccoy

## 2019-12-05 DIAGNOSIS — N2581 Secondary hyperparathyroidism of renal origin: Secondary | ICD-10-CM | POA: Diagnosis not present

## 2019-12-05 DIAGNOSIS — Z992 Dependence on renal dialysis: Secondary | ICD-10-CM | POA: Diagnosis not present

## 2019-12-05 DIAGNOSIS — N186 End stage renal disease: Secondary | ICD-10-CM | POA: Diagnosis not present

## 2019-12-06 DIAGNOSIS — N2581 Secondary hyperparathyroidism of renal origin: Secondary | ICD-10-CM | POA: Diagnosis not present

## 2019-12-06 DIAGNOSIS — Z992 Dependence on renal dialysis: Secondary | ICD-10-CM | POA: Diagnosis not present

## 2019-12-06 DIAGNOSIS — N186 End stage renal disease: Secondary | ICD-10-CM | POA: Diagnosis not present

## 2019-12-09 DIAGNOSIS — N2581 Secondary hyperparathyroidism of renal origin: Secondary | ICD-10-CM | POA: Diagnosis not present

## 2019-12-09 DIAGNOSIS — Z992 Dependence on renal dialysis: Secondary | ICD-10-CM | POA: Diagnosis not present

## 2019-12-09 DIAGNOSIS — N186 End stage renal disease: Secondary | ICD-10-CM | POA: Diagnosis not present

## 2019-12-11 DIAGNOSIS — N186 End stage renal disease: Secondary | ICD-10-CM | POA: Diagnosis not present

## 2019-12-11 DIAGNOSIS — Z992 Dependence on renal dialysis: Secondary | ICD-10-CM | POA: Diagnosis not present

## 2019-12-11 DIAGNOSIS — N2581 Secondary hyperparathyroidism of renal origin: Secondary | ICD-10-CM | POA: Diagnosis not present

## 2019-12-13 DIAGNOSIS — N2581 Secondary hyperparathyroidism of renal origin: Secondary | ICD-10-CM | POA: Diagnosis not present

## 2019-12-13 DIAGNOSIS — N186 End stage renal disease: Secondary | ICD-10-CM | POA: Diagnosis not present

## 2019-12-13 DIAGNOSIS — Z992 Dependence on renal dialysis: Secondary | ICD-10-CM | POA: Diagnosis not present

## 2019-12-14 DIAGNOSIS — I10 Essential (primary) hypertension: Secondary | ICD-10-CM | POA: Diagnosis not present

## 2019-12-14 DIAGNOSIS — N186 End stage renal disease: Secondary | ICD-10-CM | POA: Diagnosis not present

## 2019-12-14 DIAGNOSIS — Z992 Dependence on renal dialysis: Secondary | ICD-10-CM | POA: Diagnosis not present

## 2019-12-16 DIAGNOSIS — N186 End stage renal disease: Secondary | ICD-10-CM | POA: Diagnosis not present

## 2019-12-16 DIAGNOSIS — Z992 Dependence on renal dialysis: Secondary | ICD-10-CM | POA: Diagnosis not present

## 2019-12-16 DIAGNOSIS — N2581 Secondary hyperparathyroidism of renal origin: Secondary | ICD-10-CM | POA: Diagnosis not present

## 2019-12-18 DIAGNOSIS — N186 End stage renal disease: Secondary | ICD-10-CM | POA: Diagnosis not present

## 2019-12-18 DIAGNOSIS — N2581 Secondary hyperparathyroidism of renal origin: Secondary | ICD-10-CM | POA: Diagnosis not present

## 2019-12-18 DIAGNOSIS — Z992 Dependence on renal dialysis: Secondary | ICD-10-CM | POA: Diagnosis not present

## 2019-12-23 DIAGNOSIS — N2581 Secondary hyperparathyroidism of renal origin: Secondary | ICD-10-CM | POA: Diagnosis not present

## 2019-12-23 DIAGNOSIS — Z992 Dependence on renal dialysis: Secondary | ICD-10-CM | POA: Diagnosis not present

## 2019-12-23 DIAGNOSIS — N186 End stage renal disease: Secondary | ICD-10-CM | POA: Diagnosis not present

## 2019-12-25 DIAGNOSIS — Z992 Dependence on renal dialysis: Secondary | ICD-10-CM | POA: Diagnosis not present

## 2019-12-25 DIAGNOSIS — N186 End stage renal disease: Secondary | ICD-10-CM | POA: Diagnosis not present

## 2019-12-25 DIAGNOSIS — N2581 Secondary hyperparathyroidism of renal origin: Secondary | ICD-10-CM | POA: Diagnosis not present

## 2019-12-27 DIAGNOSIS — N2581 Secondary hyperparathyroidism of renal origin: Secondary | ICD-10-CM | POA: Diagnosis not present

## 2019-12-27 DIAGNOSIS — Z992 Dependence on renal dialysis: Secondary | ICD-10-CM | POA: Diagnosis not present

## 2019-12-27 DIAGNOSIS — N186 End stage renal disease: Secondary | ICD-10-CM | POA: Diagnosis not present

## 2019-12-30 DIAGNOSIS — Z992 Dependence on renal dialysis: Secondary | ICD-10-CM | POA: Diagnosis not present

## 2019-12-30 DIAGNOSIS — N2581 Secondary hyperparathyroidism of renal origin: Secondary | ICD-10-CM | POA: Diagnosis not present

## 2019-12-30 DIAGNOSIS — N186 End stage renal disease: Secondary | ICD-10-CM | POA: Diagnosis not present

## 2020-01-01 DIAGNOSIS — Z992 Dependence on renal dialysis: Secondary | ICD-10-CM | POA: Diagnosis not present

## 2020-01-01 DIAGNOSIS — N2581 Secondary hyperparathyroidism of renal origin: Secondary | ICD-10-CM | POA: Diagnosis not present

## 2020-01-01 DIAGNOSIS — N186 End stage renal disease: Secondary | ICD-10-CM | POA: Diagnosis not present

## 2020-01-03 DIAGNOSIS — N2581 Secondary hyperparathyroidism of renal origin: Secondary | ICD-10-CM | POA: Diagnosis not present

## 2020-01-03 DIAGNOSIS — Z992 Dependence on renal dialysis: Secondary | ICD-10-CM | POA: Diagnosis not present

## 2020-01-03 DIAGNOSIS — N186 End stage renal disease: Secondary | ICD-10-CM | POA: Diagnosis not present

## 2020-01-06 DIAGNOSIS — Z992 Dependence on renal dialysis: Secondary | ICD-10-CM | POA: Diagnosis not present

## 2020-01-06 DIAGNOSIS — N186 End stage renal disease: Secondary | ICD-10-CM | POA: Diagnosis not present

## 2020-01-06 DIAGNOSIS — N2581 Secondary hyperparathyroidism of renal origin: Secondary | ICD-10-CM | POA: Diagnosis not present

## 2020-01-08 DIAGNOSIS — N186 End stage renal disease: Secondary | ICD-10-CM | POA: Diagnosis not present

## 2020-01-08 DIAGNOSIS — Z992 Dependence on renal dialysis: Secondary | ICD-10-CM | POA: Diagnosis not present

## 2020-01-08 DIAGNOSIS — N2581 Secondary hyperparathyroidism of renal origin: Secondary | ICD-10-CM | POA: Diagnosis not present

## 2020-01-10 DIAGNOSIS — Z992 Dependence on renal dialysis: Secondary | ICD-10-CM | POA: Diagnosis not present

## 2020-01-10 DIAGNOSIS — N186 End stage renal disease: Secondary | ICD-10-CM | POA: Diagnosis not present

## 2020-01-10 DIAGNOSIS — N2581 Secondary hyperparathyroidism of renal origin: Secondary | ICD-10-CM | POA: Diagnosis not present

## 2020-01-11 ENCOUNTER — Encounter (HOSPITAL_COMMUNITY): Payer: Self-pay

## 2020-01-11 ENCOUNTER — Emergency Department (HOSPITAL_COMMUNITY)
Admission: EM | Admit: 2020-01-11 | Discharge: 2020-01-11 | Disposition: A | Discharge status: Home / Self Care | Payer: Medicare HMO | Attending: Emergency Medicine | Admitting: Emergency Medicine

## 2020-01-11 DIAGNOSIS — R5383 Other fatigue: Secondary | ICD-10-CM | POA: Insufficient documentation

## 2020-01-11 DIAGNOSIS — Z5321 Procedure and treatment not carried out due to patient leaving prior to being seen by health care provider: Secondary | ICD-10-CM | POA: Diagnosis not present

## 2020-01-11 DIAGNOSIS — R52 Pain, unspecified: Secondary | ICD-10-CM | POA: Diagnosis not present

## 2020-01-11 DIAGNOSIS — R7889 Finding of other specified substances, not normally found in blood: Secondary | ICD-10-CM | POA: Diagnosis not present

## 2020-01-11 DIAGNOSIS — R42 Dizziness and giddiness: Secondary | ICD-10-CM | POA: Diagnosis not present

## 2020-01-11 DIAGNOSIS — K920 Hematemesis: Secondary | ICD-10-CM | POA: Insufficient documentation

## 2020-01-11 DIAGNOSIS — R1084 Generalized abdominal pain: Secondary | ICD-10-CM | POA: Diagnosis not present

## 2020-01-11 LAB — TYPE AND SCREEN
ABO/RH(D): O POS
Antibody Screen: POSITIVE

## 2020-01-11 LAB — COMPREHENSIVE METABOLIC PANEL
ALT: 9 U/L (ref 0–44)
AST: 10 U/L — ABNORMAL LOW (ref 15–41)
Albumin: 3.4 g/dL — ABNORMAL LOW (ref 3.5–5.0)
Alkaline Phosphatase: 87 U/L (ref 38–126)
Anion gap: 19 — ABNORMAL HIGH (ref 5–15)
BUN: 66 mg/dL — ABNORMAL HIGH (ref 6–20)
CO2: 30 mmol/L (ref 22–32)
Calcium: 9.5 mg/dL (ref 8.9–10.3)
Chloride: 87 mmol/L — ABNORMAL LOW (ref 98–111)
Creatinine, Ser: 10.37 mg/dL — ABNORMAL HIGH (ref 0.61–1.24)
GFR calc Af Amer: 6 mL/min — ABNORMAL LOW (ref >60)
GFR calc non Af Amer: 5 mL/min — ABNORMAL LOW (ref >60)
Glucose, Bld: 115 mg/dL — ABNORMAL HIGH (ref 70–99)
Potassium: 4.5 mmol/L (ref 3.5–5.1)
Sodium: 136 mmol/L (ref 135–145)
Total Bilirubin: 0.7 mg/dL (ref 0.3–1.2)
Total Protein: 7.2 g/dL (ref 6.5–8.1)

## 2020-01-11 LAB — CBC
HCT: 23.8 % — ABNORMAL LOW (ref 39.0–52.0)
Hemoglobin: 7.1 g/dL — ABNORMAL LOW (ref 13.0–17.0)
MCH: 27.4 pg (ref 26.0–34.0)
MCHC: 29.8 g/dL — ABNORMAL LOW (ref 30.0–36.0)
MCV: 91.9 fL (ref 80.0–100.0)
Platelets: 219 10*3/uL (ref 150–400)
RBC: 2.59 MIL/uL — ABNORMAL LOW (ref 4.22–5.81)
RDW: 18.2 % — ABNORMAL HIGH (ref 11.5–15.5)
WBC: 7.5 10*3/uL (ref 4.0–10.5)
nRBC: 0 % (ref 0.0–0.2)

## 2020-01-11 NOTE — ED Notes (Signed)
Pt called for vitals reassessment x3

## 2020-01-11 NOTE — ED Triage Notes (Signed)
Pt BIB [facility] for eval of low hemoglobin. Pt was at dialysis yesterday, reported they advised him his hemoglobin was around 6. Pt reports hematemesis, spitting up blood. Pt denies melena, denies blood thinners. Endorses dizziness, fatigue since yesterday.

## 2020-01-13 DIAGNOSIS — N186 End stage renal disease: Secondary | ICD-10-CM | POA: Diagnosis not present

## 2020-01-13 DIAGNOSIS — Z992 Dependence on renal dialysis: Secondary | ICD-10-CM | POA: Diagnosis not present

## 2020-01-13 DIAGNOSIS — N2581 Secondary hyperparathyroidism of renal origin: Secondary | ICD-10-CM | POA: Diagnosis not present

## 2020-01-14 DIAGNOSIS — I10 Essential (primary) hypertension: Secondary | ICD-10-CM | POA: Diagnosis not present

## 2020-01-14 DIAGNOSIS — Z992 Dependence on renal dialysis: Secondary | ICD-10-CM | POA: Diagnosis not present

## 2020-01-14 DIAGNOSIS — N186 End stage renal disease: Secondary | ICD-10-CM | POA: Diagnosis not present

## 2020-01-15 DIAGNOSIS — Z992 Dependence on renal dialysis: Secondary | ICD-10-CM | POA: Diagnosis not present

## 2020-01-15 DIAGNOSIS — N186 End stage renal disease: Secondary | ICD-10-CM | POA: Diagnosis not present

## 2020-01-15 DIAGNOSIS — N2581 Secondary hyperparathyroidism of renal origin: Secondary | ICD-10-CM | POA: Diagnosis not present

## 2020-01-16 ENCOUNTER — Ambulatory Visit (HOSPITAL_COMMUNITY)
Admission: RE | Admit: 2020-01-16 | Discharge: 2020-01-16 | Disposition: A | Discharge status: Home / Self Care | Payer: Medicare HMO | Source: Ambulatory Visit | Attending: Internal Medicine | Admitting: Internal Medicine

## 2020-01-16 ENCOUNTER — Other Ambulatory Visit: Payer: Self-pay

## 2020-01-16 DIAGNOSIS — N186 End stage renal disease: Secondary | ICD-10-CM | POA: Insufficient documentation

## 2020-01-16 DIAGNOSIS — D631 Anemia in chronic kidney disease: Secondary | ICD-10-CM | POA: Diagnosis not present

## 2020-01-16 LAB — PREPARE RBC (CROSSMATCH)

## 2020-01-16 MED ORDER — SODIUM CHLORIDE 0.9% IV SOLUTION: Freq: Once | INTRAVENOUS | Status: AC

## 2020-01-16 NOTE — Progress Notes (Signed)
PATIENT CARE CENTER NOTE  Diagnosis: Anemia; Hemoglobin 6.1   Provider: Corliss Parish, MD   Procedure: 2 units PRBC   Note: Type and screen drawn and patient received 2 units of blood. Patient tolerated transfusion well with no adverse reaction. Vital signs remained stable throughout transfusion. Discharge instructions given. Patient alert, oriented and ambulatory at discharge.

## 2020-01-16 NOTE — Discharge Instructions (Signed)

## 2020-01-17 DIAGNOSIS — N2581 Secondary hyperparathyroidism of renal origin: Secondary | ICD-10-CM | POA: Diagnosis not present

## 2020-01-17 DIAGNOSIS — Z992 Dependence on renal dialysis: Secondary | ICD-10-CM | POA: Diagnosis not present

## 2020-01-17 DIAGNOSIS — N186 End stage renal disease: Secondary | ICD-10-CM | POA: Diagnosis not present

## 2020-01-17 LAB — TYPE AND SCREEN
ABO/RH(D): O POS
Antibody Screen: POSITIVE
Unit division: 0
Unit division: 0

## 2020-01-17 LAB — BPAM RBC
Blood Product Expiration Date: 202110032359
Blood Product Expiration Date: 202110062359
ISSUE DATE / TIME: 202109021209
ISSUE DATE / TIME: 202109021209
Unit Type and Rh: 5100
Unit Type and Rh: 5100

## 2020-01-20 ENCOUNTER — Inpatient Hospital Stay (HOSPITAL_COMMUNITY)
Admission: EM | Admit: 2020-01-20 | Discharge: 2020-01-23 | DRG: 640 | Disposition: A | Discharge status: Home / Self Care | Payer: Medicare HMO | Attending: Internal Medicine | Admitting: Internal Medicine

## 2020-01-20 ENCOUNTER — Other Ambulatory Visit: Payer: Self-pay

## 2020-01-20 ENCOUNTER — Encounter (HOSPITAL_COMMUNITY): Payer: Self-pay | Admitting: Student

## 2020-01-20 ENCOUNTER — Emergency Department (HOSPITAL_COMMUNITY): Payer: Medicare HMO

## 2020-01-20 DIAGNOSIS — N250 Renal osteodystrophy: Secondary | ICD-10-CM | POA: Diagnosis not present

## 2020-01-20 DIAGNOSIS — R042 Hemoptysis: Secondary | ICD-10-CM | POA: Diagnosis not present

## 2020-01-20 DIAGNOSIS — Z7982 Long term (current) use of aspirin: Secondary | ICD-10-CM | POA: Diagnosis not present

## 2020-01-20 DIAGNOSIS — F1410 Cocaine abuse, uncomplicated: Secondary | ICD-10-CM | POA: Diagnosis present

## 2020-01-20 DIAGNOSIS — F17200 Nicotine dependence, unspecified, uncomplicated: Secondary | ICD-10-CM | POA: Diagnosis not present

## 2020-01-20 DIAGNOSIS — N186 End stage renal disease: Secondary | ICD-10-CM | POA: Diagnosis not present

## 2020-01-20 DIAGNOSIS — J90 Pleural effusion, not elsewhere classified: Secondary | ICD-10-CM | POA: Diagnosis not present

## 2020-01-20 DIAGNOSIS — D631 Anemia in chronic kidney disease: Secondary | ICD-10-CM | POA: Diagnosis not present

## 2020-01-20 DIAGNOSIS — I499 Cardiac arrhythmia, unspecified: Secondary | ICD-10-CM | POA: Diagnosis not present

## 2020-01-20 DIAGNOSIS — N261 Atrophy of kidney (terminal): Secondary | ICD-10-CM | POA: Diagnosis not present

## 2020-01-20 DIAGNOSIS — I342 Nonrheumatic mitral (valve) stenosis: Secondary | ICD-10-CM | POA: Diagnosis not present

## 2020-01-20 DIAGNOSIS — Z992 Dependence on renal dialysis: Secondary | ICD-10-CM | POA: Diagnosis not present

## 2020-01-20 DIAGNOSIS — M1990 Unspecified osteoarthritis, unspecified site: Secondary | ICD-10-CM | POA: Diagnosis present

## 2020-01-20 DIAGNOSIS — J918 Pleural effusion in other conditions classified elsewhere: Secondary | ICD-10-CM | POA: Diagnosis not present

## 2020-01-20 DIAGNOSIS — E8889 Other specified metabolic disorders: Secondary | ICD-10-CM | POA: Diagnosis present

## 2020-01-20 DIAGNOSIS — D649 Anemia, unspecified: Secondary | ICD-10-CM | POA: Diagnosis not present

## 2020-01-20 DIAGNOSIS — R0602 Shortness of breath: Secondary | ICD-10-CM | POA: Diagnosis not present

## 2020-01-20 DIAGNOSIS — I4891 Unspecified atrial fibrillation: Secondary | ICD-10-CM

## 2020-01-20 DIAGNOSIS — Z79899 Other long term (current) drug therapy: Secondary | ICD-10-CM | POA: Diagnosis not present

## 2020-01-20 DIAGNOSIS — R195 Other fecal abnormalities: Secondary | ICD-10-CM | POA: Diagnosis not present

## 2020-01-20 DIAGNOSIS — I5032 Chronic diastolic (congestive) heart failure: Secondary | ICD-10-CM | POA: Diagnosis present

## 2020-01-20 DIAGNOSIS — J189 Pneumonia, unspecified organism: Secondary | ICD-10-CM

## 2020-01-20 DIAGNOSIS — I120 Hypertensive chronic kidney disease with stage 5 chronic kidney disease or end stage renal disease: Secondary | ICD-10-CM | POA: Diagnosis not present

## 2020-01-20 DIAGNOSIS — J440 Chronic obstructive pulmonary disease with acute lower respiratory infection: Secondary | ICD-10-CM | POA: Diagnosis present

## 2020-01-20 DIAGNOSIS — Z20828 Contact with and (suspected) exposure to other viral communicable diseases: Secondary | ICD-10-CM | POA: Diagnosis not present

## 2020-01-20 DIAGNOSIS — I480 Paroxysmal atrial fibrillation: Secondary | ICD-10-CM | POA: Diagnosis present

## 2020-01-20 DIAGNOSIS — I132 Hypertensive heart and chronic kidney disease with heart failure and with stage 5 chronic kidney disease, or end stage renal disease: Secondary | ICD-10-CM | POA: Diagnosis not present

## 2020-01-20 DIAGNOSIS — R069 Unspecified abnormalities of breathing: Secondary | ICD-10-CM | POA: Diagnosis not present

## 2020-01-20 DIAGNOSIS — I700 Atherosclerosis of aorta: Secondary | ICD-10-CM | POA: Diagnosis not present

## 2020-01-20 DIAGNOSIS — Z8249 Family history of ischemic heart disease and other diseases of the circulatory system: Secondary | ICD-10-CM

## 2020-01-20 DIAGNOSIS — Z20822 Contact with and (suspected) exposure to covid-19: Secondary | ICD-10-CM | POA: Diagnosis present

## 2020-01-20 DIAGNOSIS — K5909 Other constipation: Secondary | ICD-10-CM | POA: Diagnosis present

## 2020-01-20 DIAGNOSIS — G40909 Epilepsy, unspecified, not intractable, without status epilepticus: Secondary | ICD-10-CM | POA: Diagnosis present

## 2020-01-20 DIAGNOSIS — I517 Cardiomegaly: Secondary | ICD-10-CM | POA: Diagnosis not present

## 2020-01-20 DIAGNOSIS — K5730 Diverticulosis of large intestine without perforation or abscess without bleeding: Secondary | ICD-10-CM | POA: Diagnosis not present

## 2020-01-20 DIAGNOSIS — J439 Emphysema, unspecified: Secondary | ICD-10-CM | POA: Diagnosis not present

## 2020-01-20 DIAGNOSIS — R945 Abnormal results of liver function studies: Secondary | ICD-10-CM | POA: Diagnosis not present

## 2020-01-20 DIAGNOSIS — F419 Anxiety disorder, unspecified: Secondary | ICD-10-CM | POA: Diagnosis present

## 2020-01-20 DIAGNOSIS — I10 Essential (primary) hypertension: Secondary | ICD-10-CM | POA: Diagnosis present

## 2020-01-20 DIAGNOSIS — I340 Nonrheumatic mitral (valve) insufficiency: Secondary | ICD-10-CM | POA: Diagnosis not present

## 2020-01-20 DIAGNOSIS — Z87891 Personal history of nicotine dependence: Secondary | ICD-10-CM | POA: Diagnosis not present

## 2020-01-20 DIAGNOSIS — I509 Heart failure, unspecified: Secondary | ICD-10-CM | POA: Diagnosis not present

## 2020-01-20 DIAGNOSIS — J811 Chronic pulmonary edema: Secondary | ICD-10-CM | POA: Diagnosis not present

## 2020-01-20 DIAGNOSIS — E8770 Fluid overload, unspecified: Secondary | ICD-10-CM | POA: Diagnosis not present

## 2020-01-20 DIAGNOSIS — E875 Hyperkalemia: Principal | ICD-10-CM | POA: Diagnosis present

## 2020-01-20 DIAGNOSIS — F329 Major depressive disorder, single episode, unspecified: Secondary | ICD-10-CM | POA: Diagnosis present

## 2020-01-20 DIAGNOSIS — D509 Iron deficiency anemia, unspecified: Secondary | ICD-10-CM | POA: Diagnosis not present

## 2020-01-20 DIAGNOSIS — N189 Chronic kidney disease, unspecified: Secondary | ICD-10-CM | POA: Diagnosis present

## 2020-01-20 DIAGNOSIS — R531 Weakness: Secondary | ICD-10-CM | POA: Diagnosis not present

## 2020-01-20 DIAGNOSIS — I361 Nonrheumatic tricuspid (valve) insufficiency: Secondary | ICD-10-CM | POA: Diagnosis not present

## 2020-01-20 LAB — CBC
HCT: 24.6 % — ABNORMAL LOW (ref 39.0–52.0)
Hemoglobin: 7.3 g/dL — ABNORMAL LOW (ref 13.0–17.0)
MCH: 26.1 pg (ref 26.0–34.0)
MCHC: 29.7 g/dL — ABNORMAL LOW (ref 30.0–36.0)
MCV: 87.9 fL (ref 80.0–100.0)
Platelets: 283 10*3/uL (ref 150–400)
RBC: 2.8 MIL/uL — ABNORMAL LOW (ref 4.22–5.81)
RDW: 18.4 % — ABNORMAL HIGH (ref 11.5–15.5)
WBC: 8.5 10*3/uL (ref 4.0–10.5)
nRBC: 0.5 % — ABNORMAL HIGH (ref 0.0–0.2)

## 2020-01-20 LAB — BASIC METABOLIC PANEL
Anion gap: 22 — ABNORMAL HIGH (ref 5–15)
BUN: 131 mg/dL — ABNORMAL HIGH (ref 6–20)
CO2: 20 mmol/L — ABNORMAL LOW (ref 22–32)
Calcium: 9.6 mg/dL (ref 8.9–10.3)
Chloride: 88 mmol/L — ABNORMAL LOW (ref 98–111)
Creatinine, Ser: 13.95 mg/dL — ABNORMAL HIGH (ref 0.61–1.24)
GFR calc Af Amer: 4 mL/min — ABNORMAL LOW (ref >60)
GFR calc non Af Amer: 4 mL/min — ABNORMAL LOW (ref >60)
Glucose, Bld: 96 mg/dL (ref 70–99)
Potassium: 7.1 mmol/L (ref 3.5–5.1)
Sodium: 130 mmol/L — ABNORMAL LOW (ref 135–145)

## 2020-01-20 LAB — TROPONIN I (HIGH SENSITIVITY): Troponin I (High Sensitivity): 58 ng/L — ABNORMAL HIGH (ref <18)

## 2020-01-20 LAB — HEPATIC FUNCTION PANEL
ALT: 331 U/L — ABNORMAL HIGH (ref 0–44)
AST: 114 U/L — ABNORMAL HIGH (ref 15–41)
Albumin: 2.7 g/dL — ABNORMAL LOW (ref 3.5–5.0)
Alkaline Phosphatase: 129 U/L — ABNORMAL HIGH (ref 38–126)
Bilirubin, Direct: 0.1 mg/dL (ref 0.0–0.2)
Indirect Bilirubin: 0.5 mg/dL (ref 0.3–0.9)
Total Bilirubin: 0.6 mg/dL (ref 0.3–1.2)
Total Protein: 7 g/dL (ref 6.5–8.1)

## 2020-01-20 LAB — SARS CORONAVIRUS 2 BY RT PCR (HOSPITAL ORDER, PERFORMED IN ~~LOC~~ HOSPITAL LAB): SARS Coronavirus 2: NEGATIVE

## 2020-01-20 LAB — PROTIME-INR
INR: 1.4 — ABNORMAL HIGH (ref 0.8–1.2)
Prothrombin Time: 16.7 seconds — ABNORMAL HIGH (ref 11.4–15.2)

## 2020-01-20 LAB — HIV ANTIBODY (ROUTINE TESTING W REFLEX): HIV Screen 4th Generation wRfx: NONREACTIVE

## 2020-01-20 LAB — BRAIN NATRIURETIC PEPTIDE: B Natriuretic Peptide: >4500 pg/mL — ABNORMAL HIGH (ref 0.0–100.0)

## 2020-01-20 LAB — LIPASE, BLOOD: Lipase: 25 U/L (ref 11–51)

## 2020-01-20 LAB — PROCALCITONIN: Procalcitonin: 5.08 ng/mL

## 2020-01-20 MED ORDER — DILTIAZEM LOAD VIA INFUSION
15.0000 mg | Freq: Once | INTRAVENOUS | Status: AC
  Administered 2020-01-20: 15 mg via INTRAVENOUS
  Filled 2020-01-20: qty 15

## 2020-01-20 MED ORDER — SODIUM CHLORIDE 0.9 % IV SOLN
1.0000 g | Freq: Once | INTRAVENOUS | Status: AC
  Administered 2020-01-20: 1 g via INTRAVENOUS

## 2020-01-20 MED ORDER — DILTIAZEM HCL-DEXTROSE 125-5 MG/125ML-% IV SOLN (PREMIX)
5.0000 mg/h | INTRAVENOUS | Status: AC
  Administered 2020-01-20: 15 mg/h via INTRAVENOUS
  Administered 2020-01-20: 5 mg/h via INTRAVENOUS
  Administered 2020-01-21 (×3): 15 mg/h via INTRAVENOUS
  Administered 2020-01-22: 12.5 mg/h via INTRAVENOUS
  Filled 2020-01-20 (×6): qty 125

## 2020-01-20 MED ORDER — ACETAMINOPHEN 325 MG PO TABS
650.0000 mg | ORAL_TABLET | Freq: Four times a day (QID) | ORAL | Status: DC | PRN
  Administered 2020-01-20 – 2020-01-23 (×4): 650 mg via ORAL
  Filled 2020-01-20 (×5): qty 2

## 2020-01-20 MED ORDER — AZITHROMYCIN 250 MG PO TABS
250.0000 mg | ORAL_TABLET | Freq: Every day | ORAL | Status: DC
  Administered 2020-01-21 – 2020-01-23 (×3): 250 mg via ORAL
  Filled 2020-01-20 (×3): qty 1

## 2020-01-20 MED ORDER — SODIUM CHLORIDE 0.9% FLUSH
3.0000 mL | Freq: Two times a day (BID) | INTRAVENOUS | Status: DC
  Administered 2020-01-20 – 2020-01-23 (×4): 3 mL via INTRAVENOUS

## 2020-01-20 MED ORDER — SODIUM CHLORIDE 0.9 % IV SOLN
500.0000 mg | Freq: Once | INTRAVENOUS | Status: AC
  Administered 2020-01-20: 500 mg via INTRAVENOUS
  Filled 2020-01-20: qty 500

## 2020-01-20 MED ORDER — SODIUM CHLORIDE 0.9 % IV SOLN
1.0000 g | INTRAVENOUS | Status: DC
  Administered 2020-01-21 – 2020-01-22 (×2): 1 g via INTRAVENOUS
  Filled 2020-01-20 (×3): qty 10

## 2020-01-20 MED ORDER — CLONIDINE HCL 0.3 MG PO TABS
0.3000 mg | ORAL_TABLET | Freq: Three times a day (TID) | ORAL | Status: DC
  Administered 2020-01-20 – 2020-01-22 (×5): 0.3 mg via ORAL
  Filled 2020-01-20 (×6): qty 1

## 2020-01-20 MED ORDER — ONDANSETRON HCL 4 MG/2ML IJ SOLN: 4.0000 mg | Freq: Four times a day (QID) | INTRAMUSCULAR | Status: DC | PRN

## 2020-01-20 MED ORDER — DOXERCALCIFEROL 4 MCG/2ML IV SOLN: 3.0000 ug | INTRAVENOUS | Status: DC

## 2020-01-20 MED ORDER — ACETAMINOPHEN 650 MG RE SUPP: 650.0000 mg | Freq: Four times a day (QID) | RECTAL | Status: DC | PRN

## 2020-01-20 MED ORDER — POLYETHYLENE GLYCOL 3350 17 G PO PACK: 17.0000 g | PACK | Freq: Every day | ORAL | Status: DC | PRN

## 2020-01-20 MED ORDER — DEXTROSE 50 % IV SOLN
1.0000 | Freq: Once | INTRAVENOUS | Status: AC
  Administered 2020-01-20: 50 mL via INTRAVENOUS
  Filled 2020-01-20: qty 50

## 2020-01-20 MED ORDER — ASPIRIN 81 MG PO CHEW
81.0000 mg | CHEWABLE_TABLET | Freq: Every day | ORAL | Status: DC
  Administered 2020-01-20 – 2020-01-22 (×3): 81 mg via ORAL
  Filled 2020-01-20 (×3): qty 1

## 2020-01-20 MED ORDER — INSULIN ASPART 100 UNIT/ML IV SOLN
5.0000 [IU] | Freq: Once | INTRAVENOUS | Status: AC
  Administered 2020-01-20: 5 [IU] via INTRAVENOUS

## 2020-01-20 MED ORDER — ALBUTEROL SULFATE HFA 108 (90 BASE) MCG/ACT IN AERS
4.0000 | INHALATION_SPRAY | Freq: Once | RESPIRATORY_TRACT | Status: AC
  Administered 2020-01-20: 4 via RESPIRATORY_TRACT
  Filled 2020-01-20: qty 6.7

## 2020-01-20 MED ORDER — HYDROXYZINE HCL 10 MG PO TABS
10.0000 mg | ORAL_TABLET | Freq: Every evening | ORAL | Status: DC | PRN
  Administered 2020-01-20: 10 mg via ORAL
  Filled 2020-01-20: qty 1

## 2020-01-20 MED ORDER — SODIUM CHLORIDE 0.9 % IV SOLN
1.0000 g | Freq: Once | INTRAVENOUS | Status: AC
  Administered 2020-01-20: 1 g via INTRAVENOUS
  Filled 2020-01-20: qty 10

## 2020-01-20 MED ORDER — ONDANSETRON HCL 4 MG PO TABS: 4.0000 mg | ORAL_TABLET | Freq: Four times a day (QID) | ORAL | Status: DC | PRN

## 2020-01-20 MED ORDER — PANTOPRAZOLE SODIUM 20 MG PO TBEC
20.0000 mg | DELAYED_RELEASE_TABLET | Freq: Every day | ORAL | Status: DC
  Administered 2020-01-20 – 2020-01-22 (×3): 20 mg via ORAL
  Filled 2020-01-20 (×3): qty 1

## 2020-01-20 MED ORDER — SODIUM BICARBONATE 8.4 % IV SOLN
50.0000 meq | Freq: Once | INTRAVENOUS | Status: AC
  Administered 2020-01-20: 50 meq via INTRAVENOUS
  Filled 2020-01-20: qty 50

## 2020-01-20 MED ORDER — CHLORHEXIDINE GLUCONATE CLOTH 2 % EX PADS: 6.0000 | MEDICATED_PAD | Freq: Every day | CUTANEOUS | Status: DC

## 2020-01-20 MED ORDER — NICOTINE 14 MG/24HR TD PT24
14.0000 mg | MEDICATED_PATCH | Freq: Every day | TRANSDERMAL | Status: DC
  Administered 2020-01-23: 14 mg via TRANSDERMAL
  Filled 2020-01-20 (×3): qty 1

## 2020-01-20 NOTE — Consult Note (Signed)
[facility] Renal Consultation Note    Indication for Consultation:  Management of ESRD/hemodialysis, anemia, hypertension/volume, and secondary hyperparathyroidism.  HPI: Jeremy Mccoy is a 54 y.o. male with PMH including ESRD on dialysis MWF, CHF, COPD, HTN, seizures, and cocaine use, who presented to the ED on 01/20/20 with generalized weakness, cough, and body aches. Reports he has felt progressively worse over the past 2 weeks. He tried limiting his fluids but shortness of breath did not improve. Reports his last cocaine use was about 1 week ago but he has not used any illicit drugs or tobacco for 1 week because he was feeling so poorly. His hemoglobin was 5.8 on 9/1 and he received 2 units PRBC on 01/16/20, but still did not feel much better. Anemia has been an ongoing issue and he is on max dose ESA. Patient endorses pain "all over" earlier today including chest and abdomen, now improved. Also reports shortness of breath and orthopnea. On presentation to the ED, K+ 7.1, BUN 131, Cr 13.95, Hgb 7.3, WBC 8.5. HR 100-170s, BP 152/101, T 97.20F. COVID 19 negative. CT chest showed large R upper lobe consolidation with patchy smaller consolidations, conglomerate lymphadenopathy, R pleural effusion, and diffuse ground-glass opacities bilaterally, which appear consistent with pulmonary edema. Patient was taken for urgent HD.   Patient chronically shortens dialysis treatments to 2-3 hours (reports he "died" when he stayed full time) and last 3 potassium levels were 6.9 outpatient.   Patient seen on HD. In a.fib RVR with HR variable from 120s-160's, diltiazem recently titrated and BP stable. Patient is asymptomatic with no dizziness, CP or palpitations and is alert and oriented x3. Discussed with Dr. Jonnie Finner, continue treatment and ok to continue pulling fluid as long as BP is >854OEVO systolic.   Past Medical History:  Diagnosis Date   Anemia    Anxiety    Arthritis    "qwhere"  (05/15/2013)   CHF (congestive heart failure) (HCC)    COPD (chronic obstructive pulmonary disease) (HCC)    Crack cocaine use    Dental caries    Depression    ESRD (end stage renal disease) on dialysis ([place])    TTS: [address] [place] (05/15/2013)   Headache(784.0)    "get it from going to dialysis; when they get real bad I come to hospital" (05/15/2013)   Hematemesis/vomiting blood    History of blood transfusion 10/2011; 04/2013   HTN (hypertension) 06/12/2012   Seizures ([place])    Shortness of breath    "just when I have too much potassium is too high" (05/15/2013)   Past Surgical History:  Procedure Laterality Date   AV FISTULA PLACEMENT  11/29/2011   Procedure: ARTERIOVENOUS (AV) FISTULA CREATION;  Surgeon: Angelia Mould, MD;  Location: [facility];  Service: Vascular;  Laterality: Left;   ESOPHAGOGASTRODUODENOSCOPY N/A 05/17/2013   Procedure: ESOPHAGOGASTRODUODENOSCOPY (EGD);  Surgeon: Beryle Beams, MD;  Location: [facility] ENDOSCOPY;  Service: Endoscopy;  Laterality: N/A;   INGUINAL HERNIA REPAIR Bilateral 1967   IR FLUORO GUIDE CV LINE RIGHT  10/20/2017   IR THROMBECTOMY AV FISTULA W/THROMBOLYSIS/PTA INC/SHUNT/IMG LEFT Left 10/24/2017   IR US GUIDE VASC ACCESS LEFT  10/24/2017   IR US GUIDE VASC ACCESS RIGHT  10/20/2017   RENAL BIOPSY  11/07/2011   Family History  Problem Relation Age of Onset   Heart attack Father    Social History:  reports that he has quit smoking. His smoking use included cigars and cigarettes. He has a 50.00 pack-year  smoking history. He has never used smokeless tobacco. He reports current drug use. Drugs: Marijuana and Cocaine. He reports that he does not drink alcohol.  ROS: As per HPI otherwise negative.  Physical Exam: Vitals:   01/20/20 1330 01/20/20 1400 01/20/20 1430 01/20/20 1500  BP: (!) 123/110 (!) 141/98 (!) 117/99 109/87  Pulse: 69 (!) 144 (!) 119 77  Resp:  20    Temp:      TempSrc:      SpO2:      Weight:       Height:         General: Well developed, well nourished, in no acute distress. Head: Normocephalic, atraumatic, sclera non-icteric, mucus membranes are moist. Neck: Supple without lymphadenopathy/masses. JVD not elevated. Lungs: Breathing is unlabored on [place]. Diffuse crackles right upper and lower lobes, decreased breath sounds left lower lobe.  Heart: RRR with normal S1, S2. No murmurs, rubs, or gallops appreciated. Abdomen: Soft, non-tender, non-distended with normoactive bowel sounds. No rebound/guarding. No obvious abdominal masses. Musculoskeletal:  Strength and tone appear normal for age. Lower extremities: No edema or ischemic changes, no open wounds. Neuro: Alert and oriented X 3. Moves all extremities spontaneously. Psych:  Responds to questions appropriately with a normal affect. Dialysis Access: LUE AVF currently accessed  No Known Allergies Prior to Admission medications   Medication Sig Start Date End Date Taking? Authorizing Provider  acetaminophen (TYLENOL) 500 MG tablet Take 1 tablet (500 mg total) by mouth every 8 (eight) hours as needed for mild pain or fever. 6 times a day, 500 mg, 12 to 24 tabs a day, 6,000 to 12,000 mg daily for pain Patient taking differently: Take 500 mg by mouth every 8 (eight) hours as needed for mild pain or fever.  08/05/16   Molt, Bethany, DO  amLODipine (NORVASC) 10 MG tablet Take 1 tablet (10 mg total) by mouth daily. 01/25/14   McLean-Scocuzza, Nino Glow, MD  aspirin 81 MG chewable tablet Chew 1 tablet (81 mg total) by mouth daily. 01/25/14   McLean-Scocuzza, Nino Glow, MD  B Complex-C-Zn-Folic Acid (DIALYVITE/ZINC) TABS Take by mouth. 09/11/19   [provider]  calcium carbonate (OS-CAL) 1250 (500 Ca) MG chewable tablet Chew by mouth.    [provider]  cinacalcet (SENSIPAR) 90 MG tablet  10/10/19   [provider]  cloNIDine (CATAPRES) 0.3 MG tablet Take 0.3 mg by mouth 3 (three) times daily.     [provider]   Diphenhydramine-APAP 12.5-325 MG TABS Take by mouth. 06/03/19 06/01/20  [provider]  doxercalciferol (HECTOROL) 0.5 MCG capsule Doxercalciferol (Hectorol) 11/22/19 11/20/20  [provider]  Etelcalcetide HCl (PARSABIV IV) Etelcalcetide Hermina Staggers) 11/13/19 11/09/20  [provider]  hydrALAZINE (APRESOLINE) 25 MG tablet Take 25 mg by mouth 3 (three) times daily. 07/20/19   [provider]  hydrOXYzine (ATARAX/VISTARIL) 25 MG tablet Take 25 mg by mouth 3 (three) times daily as needed for itching.  06/10/15   [provider]  Nutritional Supplements (FEEDING SUPPLEMENT, NEPRO CARB STEADY,) LIQD Take 237 mLs by mouth 3 (three) times daily as needed (Supplement). 05/17/13   Mikhail, Velta Addison, DO  pantoprazole (PROTONIX) 20 MG tablet Take 20 mg by mouth daily. 02/21/16   [provider]  patiromer Daryll Drown) 8.4 g packet Take by mouth. 09/18/19   [provider]  polyethylene glycol (MIRALAX / GLYCOLAX) packet Take 17 g by mouth daily as needed for mild constipation. 08/05/16   Molt, Bethany, DO  ranitidine (ZANTAC) 300 MG  tablet Take 300 mg by mouth daily as needed for heartburn.    [provider]  sevelamer carbonate (RENVELA) 800 MG tablet Take 2 tablets (1,600 mg total) by mouth 3 (three) times daily with meals. Patient taking differently: Take 2,400 mg by mouth 3 (three) times daily with meals. And with snacks 01/25/14   McLean-Scocuzza, Nino Glow, MD   Current Facility-Administered Medications  Medication Dose Route Frequency Provider Last Rate Last Admin   acetaminophen (TYLENOL) tablet 650 mg  650 mg Oral Q6H PRN Masoudi, Elhamalsadat, MD       Or   acetaminophen (TYLENOL) suppository 650 mg  650 mg Rectal Q6H PRN Masoudi, Elhamalsadat, MD       azithromycin (ZITHROMAX) 500 mg in sodium chloride 0.9 % 250 mL IVPB  500 mg Intravenous Once Lucrezia Starch, MD       [START ON 01/21/2020] azithromycin (ZITHROMAX) tablet 250 mg  250 mg  Oral Daily Andrew Au, MD       cefTRIAXone (ROCEPHIN) 1 g in sodium chloride 0.9 % 100 mL IVPB  1 g Intravenous Once Lucrezia Starch, MD       [START ON 01/21/2020] cefTRIAXone (ROCEPHIN) 1 g in sodium chloride 0.9 % 100 mL IVPB  1 g Intravenous Q24H Andrew Au, MD       [START ON 01/21/2020] Chlorhexidine Gluconate Cloth 2 % PADS 6 each  6 each Topical Q0600 Janalee Dane, PA-C       diltiazem (CARDIZEM) 125 mg in dextrose 5% 125 mL (1 mg/mL) infusion  5-15 mg/hr Intravenous Titrated Lucrezia Starch, MD 10 mL/hr at 01/20/20 1157 10 mg/hr at 01/20/20 1157   [START ON 01/22/2020] doxercalciferol (HECTOROL) injection 3 mcg  3 mcg Intravenous Q M,W,F-HD Milia Warth G, PA-C       nicotine (NICODERM CQ - dosed in mg/24 hours) patch 14 mg  14 mg Transdermal Daily Masoudi, Elhamalsadat, MD       ondansetron (ZOFRAN) tablet 4 mg  4 mg Oral Q6H PRN Masoudi, Elhamalsadat, MD       Or   ondansetron (ZOFRAN) injection 4 mg  4 mg Intravenous Q6H PRN Masoudi, Elhamalsadat, MD       polyethylene glycol (MIRALAX / GLYCOLAX) packet 17 g  17 g Oral Daily PRN Masoudi, Elhamalsadat, MD       sodium chloride flush (NS) 0.9 % injection 3 mL  3 mL Intravenous Q12H Masoudi, Elhamalsadat, MD       Current Outpatient Medications  Medication Sig Dispense Refill   acetaminophen (TYLENOL) 500 MG tablet Take 1 tablet (500 mg total) by mouth every 8 (eight) hours as needed for mild pain or fever. 6 times a day, 500 mg, 12 to 24 tabs a day, 6,000 to 12,000 mg daily for pain (Patient taking differently: Take 500 mg by mouth every 8 (eight) hours as needed for mild pain or fever. ) 30 tablet 0   amLODipine (NORVASC) 10 MG tablet Take 1 tablet (10 mg total) by mouth daily. 30 tablet 1   aspirin 81 MG chewable tablet Chew 1 tablet (81 mg total) by mouth daily. 30 tablet 11   B Complex-C-Zn-Folic Acid (DIALYVITE/ZINC) TABS Take by mouth.     calcium carbonate (OS-CAL) 1250 (500 Ca) MG chewable  tablet Chew by mouth.     cinacalcet (SENSIPAR) 90 MG tablet      cloNIDine (CATAPRES) 0.3 MG tablet Take 0.3 mg by mouth 3 (three) times daily.  Diphenhydramine-APAP 12.5-325 MG TABS Take by mouth.     doxercalciferol (HECTOROL) 0.5 MCG capsule Doxercalciferol (Hectorol)     Etelcalcetide HCl (PARSABIV IV) Etelcalcetide (Parsabiv)     hydrALAZINE (APRESOLINE) 25 MG tablet Take 25 mg by mouth 3 (three) times daily.     hydrOXYzine (ATARAX/VISTARIL) 25 MG tablet Take 25 mg by mouth 3 (three) times daily as needed for itching.   6   Nutritional Supplements (FEEDING SUPPLEMENT, NEPRO CARB STEADY,) LIQD Take 237 mLs by mouth 3 (three) times daily as needed (Supplement).  0   pantoprazole (PROTONIX) 20 MG tablet Take 20 mg by mouth daily.     patiromer (VELTASSA) 8.4 g packet Take by mouth.     polyethylene glycol (MIRALAX / GLYCOLAX) packet Take 17 g by mouth daily as needed for mild constipation. 14 each 0   ranitidine (ZANTAC) 300 MG tablet Take 300 mg by mouth daily as needed for heartburn.     sevelamer carbonate (RENVELA) 800 MG tablet Take 2 tablets (1,600 mg total) by mouth 3 (three) times daily with meals. (Patient taking differently: Take 2,400 mg by mouth 3 (three) times daily with meals. And with snacks) 90 tablet 0   Labs: Basic Metabolic Panel: Recent Labs  Lab 01/20/20 0910  NA 130*  K 7.1*  CL 88*  CO2 20*  GLUCOSE 96  BUN 131*  CREATININE 13.95*  CALCIUM 9.6   CBC: Recent Labs  Lab 01/20/20 0910  WBC 8.5  HGB 7.3*  HCT 24.6*  MCV 87.9  PLT 283   Studies/Results: CT ABDOMEN PELVIS WO CONTRAST  Result Date: 01/20/2020 CLINICAL DATA:  Diffuse abdominal pain. Concern for diverticulitis. Generalized weakness, cough, body aches for few days. EXAM: CT CHEST, ABDOMEN AND PELVIS WITHOUT CONTRAST TECHNIQUE: Multidetector CT imaging of the chest, abdomen and pelvis was performed following the standard protocol without IV contrast. COMPARISON:  Chest x-ray  from earlier same day. Chest CT dated 07/25/2015. FINDINGS: CT CHEST FINDINGS Cardiovascular: No pericardial effusion. No thoracic aortic aneurysm. Densely calcified mitral valve annulus. Mediastinum/Nodes: Conglomerate lymphadenopathy within the mediastinum and bilateral perihilar regions, some calcified and some noncalcified. Esophagus is unremarkable. Trachea and central bronchi are unremarkable. Lungs/Pleura: Large dense consolidation within the RIGHT upper lobe, additional patchy smaller consolidations within the RIGHT upper lobe. RIGHT pleural effusion, small to moderate in size. Diffuse ground-glass opacities bilaterally, likely accentuated by areas of air trapping. Emphysematous changes bilaterally, mild to moderate in degree. Scattered calcified granulomas within each lung. No suspicious appearing pulmonary nodule or mass. No pneumothorax. Musculoskeletal: No acute findings. Diffuse heterogeneous mineralization of the osseous structures. CT ABDOMEN PELVIS FINDINGS Hepatobiliary: No focal liver abnormality is seen. Gallbladder is not well visualized. No bile duct dilatation is seen. Pancreas: Not well seen.  Grossly normal. Spleen: Normal in size without focal abnormality. Adrenals/Urinary Tract: Both kidneys are atrophic. Stomach/Bowel: No dilated large or small bowel loops. Scattered diverticulosis but no focal inflammatory changes seen to suggest acute diverticulitis, although characterization of the bowel walls is limited by the lack of oral contrast. Appendix is not seen. Vascular/Lymphatic: Aortic atherosclerosis. No enlarged lymph nodes are visualized within the abdomen or pelvis. Reproductive: Prostate is unremarkable. Other: Moderate amount of ascites. No abscess collection identified. No free intraperitoneal air. Musculoskeletal: No acute findings. Diffuse heterogeneous mineralization of the osseous structures. IMPRESSION: 1. Large dense consolidation within the RIGHT upper lobe, with additional  patchy smaller consolidations within the RIGHT upper lobe, consistent with multifocal pneumonia. 2. Conglomerate lymphadenopathy within the mediastinum and  bilateral perihilar regions, some calcified and some noncalcified. This is most likely reactive in the absence of a known underlying malignancy, with a granulomatous etiology favored. 3. RIGHT pleural effusion, small to moderate in size. 4. Diffuse ground-glass opacities bilaterally, likely accentuated by areas of air trapping. Differential includes atypical pneumonias such as viral or fungal, interstitial pneumonias, edema related to volume overload/CHF, chronic interstitial diseases, hypersensitivity pneumonitis, and respiratory bronchiolitis. 5. Moderate amount of ascites. No abscess collection identified. No free intraperitoneal air. 6. Colonic diverticulosis without evidence of acute diverticulitis. 7. 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 8. Atrophic kidneys. Aortic Atherosclerosis (ICD10-I70.0) and Emphysema (ICD10-J43.9). Electronically Signed   By: Franki Cabot M.D.   On: 01/20/2020 11:41   DG Chest 2 View  Result Date: 01/20/2020 CLINICAL DATA:  Generalized weakness EXAM: CHEST - 2 VIEW COMPARISON:  07/25/2019 FINDINGS: Focal right upper lobe consolidation. Diffuse interstitial opacity with fissure thickening. Cardiomegaly that is chronic. Sclerotic appearance of the bones attributed to patient's end-stage renal disease. IMPRESSION: Right upper lobe pneumonia. Followup PA and lateral chest X-ray is recommended in 3-4 weeks following trial of antibiotic therapy to ensure resolution. Interstitial pulmonary edema. Electronically Signed   By: Monte Fantasia M.D.   On: 01/20/2020 09:42   CT Chest Wo Contrast  Result Date:  01/20/2020 CLINICAL DATA:  Diffuse abdominal pain. Concern for diverticulitis. Generalized weakness, cough, body aches for few days. EXAM: CT CHEST, ABDOMEN AND PELVIS WITHOUT CONTRAST TECHNIQUE: Multidetector CT imaging of the chest, abdomen and pelvis was performed following the standard protocol without IV contrast. COMPARISON:  Chest x-ray from earlier same day. Chest CT dated 07/25/2015. FINDINGS: CT CHEST FINDINGS Cardiovascular: No pericardial effusion. No thoracic aortic aneurysm. Densely calcified mitral valve annulus. Mediastinum/Nodes: Conglomerate lymphadenopathy within the mediastinum and bilateral perihilar regions, some calcified and some noncalcified. Esophagus is unremarkable. Trachea and central bronchi are unremarkable. Lungs/Pleura: Large dense consolidation within the RIGHT upper lobe, additional patchy smaller consolidations within the RIGHT upper lobe. RIGHT pleural effusion, small to moderate in size. Diffuse ground-glass opacities bilaterally, likely accentuated by areas of air trapping. Emphysematous changes bilaterally, mild to moderate in degree. Scattered calcified granulomas within each lung. No suspicious appearing pulmonary nodule or mass. No pneumothorax. Musculoskeletal: No acute findings. Diffuse heterogeneous mineralization of the osseous structures. CT ABDOMEN PELVIS FINDINGS Hepatobiliary: No focal liver abnormality is seen. Gallbladder is not well visualized. No bile duct dilatation is seen. Pancreas: Not well seen.  Grossly normal. Spleen: Normal in size without focal abnormality. Adrenals/Urinary Tract: Both kidneys are atrophic. Stomach/Bowel: No dilated large or small bowel loops. Scattered diverticulosis but no focal inflammatory changes seen to suggest acute diverticulitis, although characterization of the bowel walls is limited by the lack of oral contrast. Appendix is not seen. Vascular/Lymphatic: Aortic atherosclerosis. No enlarged lymph nodes are visualized within the  abdomen or pelvis. Reproductive: Prostate is unremarkable. Other: Moderate amount of ascites. No abscess collection identified. No free intraperitoneal air. Musculoskeletal: No acute findings. Diffuse heterogeneous mineralization of the osseous structures. IMPRESSION: 1. Large dense consolidation within the RIGHT upper lobe, with additional patchy smaller consolidations within the RIGHT upper lobe, consistent with multifocal pneumonia. 2. Conglomerate lymphadenopathy within the mediastinum and bilateral perihilar regions, some calcified and some noncalcified. This is most likely reactive in the absence of a known underlying malignancy, with a granulomatous  etiology favored. 3. RIGHT pleural effusion, small to moderate in size. 4. Diffuse ground-glass opacities bilaterally, likely accentuated by areas of air trapping. Differential includes atypical pneumonias such as viral or fungal, interstitial pneumonias, edema related to volume overload/CHF, chronic interstitial diseases, hypersensitivity pneumonitis, and respiratory bronchiolitis. 5. Moderate amount of ascites. No abscess collection identified. No free intraperitoneal air. 6. Colonic diverticulosis without evidence of acute diverticulitis. 7. 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 8. Atrophic kidneys. Aortic Atherosclerosis (ICD10-I70.0) and Emphysema (ICD10-J43.9). Electronically Signed   By: Franki Cabot M.D.   On: 01/20/2020 11:41    Dialysis Orders:  Center: [facility] on MWF. MWF 180Nre Time: 4:15 hrs, BFR 500, DFR 800, EDW 67.5kg, 1K/2.5Ca, AVF 15g, no heparin Mircera 259mcg IV q 2 weeks- last dose 8/30 Venofer 100mg  IV q HD 9/3-9/24 Parsabiv 15mg  IV q HD hectorol 3 mcg IV q HD  Assessment/Plan: 1.  Pneumonia: CT  chest showing large R upper lobe infiltrate with patchy scattered infiltrates. Started on zithromycin and ceftriaxone. Also likely has a component of pulmonary edema, attempting UF today as tolerated (see #3).  2. Hyperkalemia: K+ 7.1, undergoing urgent HD today with low K+ bath. Of note, this is not far from his baseline- outpatient K+ has been 6.9 since June. Prescribed veltassa outpatient but admits he does not take it. Patient has been counseled about importance of low K+ diet and full dialysis many times.  3. A. Fib with RVR: On diltiazem, likely exacerbated by acute illness. Discussed with Dr. Jonnie Finner, ok to continue to pull fluid with HD as long as BP is >131mmHg and patient is asymptomatic, RN aware.   4.  ESRD:  On MWF schedule but chronically shortens his treatments to 2-3 hours. Undergoing urgent HD today, will reeval tomorrow to determine if extra treatment is needed. 5.  Hypertension/volume: BP elevated with pulmonary edema on CXR and shortness of breath. 5.5kg above his outpatient EDW. UF today as tolerated, continue to titrate down volume.  6.  Anemia: Hgb 7.3, received 2 units PRBC on 9/2 and max dose mircera on 01/13/20. Denies any blood loss including melena. Transfuse PRN. Apparently told PA he was coughing up blood on 8/27. May need hematology consult if no clear source of blood loss can be identified. Holding venofer due to acute infection.  7.  Metabolic bone disease: Calcium 9.6. Parsabiv not on formulary. Continue hectoro and monitor calcium. Last outpatient phos 6.3. Continue renvela 6.3.  8.  Nutrition:  Renal diet/fluid restrictions recommended.  9. Cocaine use: Reports last use about 1 week ago. He recently started attending church with a new friend who is sober, and plans to move in with this friend soon (friend lives in [place]). He does not want to change outpatient dialysis units.   Anice Paganini, PA-C 01/20/2020, 3:23 PM  [facility] Pager: [phone]

## 2020-01-20 NOTE — Procedures (Signed)
   I was present at this dialysis session, have reviewed the session itself and made  appropriate changes Kelly Splinter MD [place] pager [phone]   01/20/2020, 3:59 PM

## 2020-01-20 NOTE — Progress Notes (Addendum)
Date: 01/20/2020               Patient Name:  Jeremy Mccoy MRN: [id number]  DOB: [birth year] Age / Sex: 54 y.o., male   PCP: Patient, No Pcp Per         Medical Service: Internal Medicine Teaching Service         Attending Physician: Dr. Gilles Chiquito, MD    First Contact: Dr. Bridgett Larsson Pager: [phone]  Second Contact: Masoudi Pager: [phone]       After Hours (After 5p/  First Contact Pager: [phone]  weekends / holidays): Second Contact Pager: [phone]   Chief Complaint: generalized weakness, body aches, cough  History of Present Illness:   [name] is a 54 y/o M with hx of ESRD on MWF dialysis, HTN, COPD, history of seizure disorder, chronic cocaine use, presenting with cc of generalized weakness, body aches, and cough for the past 2 weeks. Reports that he was coughing up blood 2 weeks ago, but this has resolved and is now green sputum. Denies seeing other sources of blood loss since then. Has a myriad of other symptoms including congestion, back pain, n/v, constipation, dizziness.   He was noted to have anemia with hgb of 6.1 recently at dialysis and received 2 units pRBCs on 9/2 at the [facility] center. He did not feel significantly better after this transfusion. Of note, on Type and Screen he has had positive anti-IgE antibody as far as back as 2014, requires special blood for transfusion.   At his most recent dialysis on Friday at [facility] on [address], there was an issue with IV access and he received only 1 hour before they sent him home. His next scheduled dialysis was today, but he presented to the ED for worsening symptoms, found to have a K of 7.1.   ED Course -On CXR found to have a RUL consolidation and diffuse interstitial opacities. CT chest confirmed this, and also shows moderate R-sided pleural effusion. Started on azithromycin and ceftriaxone. -Found to be in A fib with RVR, pulse 100-170s. ED started him on diltiazem drip with documented improvement to pulse  60-70s around noon, but at time of our interview patient was again in A fib with RVR. -Found to have K of 7.1 in the setting of under-dialysis, only had 1 hour on Friday because of issue with access. Insulin and dextrose administered in ED. Nephro arranged for urgent dialysis.  Meds:  Current Outpatient Medications  Medication Instructions  . acetaminophen (TYLENOL) 500 mg, Oral, Every 8 hours PRN, 6 times a day, 500 mg, 12 to 24 tabs a day, 6,000 to 12,000 mg daily for pain  . amLODipine (NORVASC) 10 mg, Oral, Daily  . aspirin 81 mg, Oral, Daily  . B Complex-C-Zn-Folic Acid (DIALYVITE/ZINC) TABS Oral  . calcium carbonate (OS-CAL) 1250 (500 Ca) MG chewable tablet Oral  . cinacalcet (SENSIPAR) 90 MG tablet No dose, route, or frequency recorded.  . cloNIDine (CATAPRES) 0.3 mg, Oral, 3 times daily  . Diphenhydramine-APAP 12.5-325 MG TABS Oral  . doxercalciferol (HECTOROL) 0.5 MCG capsule Doxercalciferol (Hectorol)  . Etelcalcetide HCl (PARSABIV IV) Etelcalcetide (Parsabiv)  . hydrALAZINE (APRESOLINE) 25 mg, Oral, 3 times daily  . hydrOXYzine (ATARAX/VISTARIL) 25 mg, Oral, 3 times daily PRN  . Nutritional Supplements (FEEDING SUPPLEMENT, NEPRO CARB STEADY,) LIQD 237 mLs, Oral, 3 times daily PRN  . pantoprazole (PROTONIX) 20 mg, Oral, Daily  . patiromer (VELTASSA) 8.4 g packet Oral  . polyethylene glycol (  MIRALAX / GLYCOLAX) 17 g, Oral, Daily PRN  . ranitidine (ZANTAC) 300 mg, Oral, Daily PRN  . sevelamer carbonate (RENVELA) 1,600 mg, Oral, 3 times daily with meals    Allergies: Allergies as of 01/20/2020  . (No Known Allergies)   Past Medical History:  Diagnosis Date  . Anemia   . Anxiety   . Arthritis    "qwhere" (05/15/2013)  . CHF (congestive heart failure) ([place])   . COPD (chronic obstructive pulmonary disease) ([place])   . Crack cocaine use   . Dental caries   . Depression   . ESRD (end stage renal disease) on dialysis [facility])    TTS: [address] [place] (05/15/2013)  .  Headache(784.0)    "get it from going to dialysis; when they get real bad I come to hospital" (05/15/2013)  . Hematemesis/vomiting blood   . History of blood transfusion 10/2011; 04/2013  . HTN (hypertension) 06/12/2012  . Seizures ([place])   . Shortness of breath    "just when I have too much potassium is too high" (05/15/2013)    Family History:  Family History  Problem Relation Age of Onset  . Heart attack Father      Social History:  Social History   Tobacco Use  . Smoking status: Former Smoker    Packs/day: 1.00    Years: 50.00    Pack years: 50.00    Types: Cigars, Cigarettes  . Smokeless tobacco: Never Used  Substance Use Topics  . Alcohol use: No    Comment: 05/15/2013 "aien't drank in ~ 25 yrs"  . Drug use: Yes    Types: Marijuana, Cocaine    Comment: Precription drugs (e.g. percocet) "I take what I have to to controll my constant pain" (05/15/2013)    Review of Systems: Review of Systems  Constitutional: Negative for chills and fever.  HENT: Positive for congestion. Negative for sore throat.   Eyes: Negative for blurred vision and double vision.  Respiratory: Positive for cough, hemoptysis (2 weeks ago, now producing green phlegm), sputum production (green) and shortness of breath.   Cardiovascular: Positive for leg swelling (improving). Negative for chest pain.  Gastrointestinal: Positive for constipation, nausea and vomiting. Negative for blood in stool, diarrhea and melena.  Genitourinary: Negative for dysuria and urgency.  Musculoskeletal: Positive for myalgias.  Skin: Negative for itching and rash.  Neurological: Positive for dizziness and weakness.    Physical Exam: Blood pressure (!) 141/98, pulse (!) 144, temperature 97.6 F (36.4 C), temperature source Oral, resp. rate 20, height 6' (1.829 m), weight 72 kg, SpO2 99 %. Physical Exam Vitals and nursing note reviewed.  Constitutional:      General: He is not in acute distress.    Appearance: He is  ill-appearing.  HENT:     Head: Normocephalic and atraumatic.     Right Ear: External ear normal.     Left Ear: External ear normal.     Nose: Nose normal. No rhinorrhea.     Mouth/Throat:     Mouth: Mucous membranes are moist.  Eyes:     Extraocular Movements: Extraocular movements intact.  Cardiovascular:     Rate and Rhythm: Tachycardia present. Rhythm irregular.  Pulmonary:     Effort: Pulmonary effort is normal. No respiratory distress.     Breath sounds: Rhonchi (RUL) present.  Abdominal:     General: Abdomen is flat.     Palpations: Abdomen is soft.  Musculoskeletal:        General: No deformity.  Cervical back: Normal range of motion.     Right lower leg: No edema.     Left lower leg: No edema.  Skin:    General: Skin is warm and dry.  Neurological:     General: No focal deficit present.     Mental Status: He is alert and oriented to person, place, and time.  Psychiatric:        Mood and Affect: Mood normal.        Behavior: Behavior normal.     EKG: personally reviewed my interpretation is A fib with RVR  CXR: personally reviewed my interpretation is RUL consolidation, interstitial opacities  Assessment & Plan by Problem: Active Problems:   Cocaine abuse (HCC)   Anemia in chronic kidney disease   Essential hypertension   ESRD on hemodialysis (HCC)   PAF (paroxysmal atrial fibrillation) (HCC)   Hyperkalemia, diminished renal excretion  Mr. Stanbery is a 54 y/o M with hx significant for ESRD on MWF dialysis, HTN, chronic cocaine use presenting with cc of generalized weakness and body aches, also with cough for several weeks. Found to have pneumonia on imaging, hyperkalmic to 7.1 with recent under-dialysis, and new onset A fib with RVR.  Hyperkalemia ESRD, on MWF dialysis Found to have K of 7.1 in the setting of under-dialysis, only had 1 hour on Friday because of issue with access. EKG with A fib. Currently receiving dialysis. -dialysis per  nephro  Paroxysmal A fib with RVR Presented with pulse 100-170s and irregularly irregular. Started on diltiazem drip in ED with improvement to pulse 60-70s around noon, but at time of our interview patient was again in A fib with RVR. Hx of transient A fib in 2015 2/2 volume overload and metabolic derangements from missed dialysis which resolved after correcting these. CHADS-VASC -continue diltiazem drip -telemetry -dialysis today  Pneumonia Pleural effusion, new On CXR found to have a RUL consolidation and diffuse interstitial opacities, as well as small to moderate pleural effusion, lymphadenopathy, and small ascites. CT chest confirmed this, and also shows moderate R-sided pleural effusion. No fevers or chills at home. Started on azithromycin and ceftriaxone. -f/u procalcitonin -continue azithromycin and ceftriaxone, day 1 -f/u CT outpatient to ensure resolution of lymphadenopathy, pleural effusion, and ascites  Normocytic anemia, anemia of renal disease vs blood loss Presented with hgb of 7.3. INR elevated to 1.4. Anemia has been an ongoing issue and he is on max dose mircera. Patient reported hematemesis 2 weeks ago which has since resolved. Received 2 units pRBC outpatient on 9/2. Type and screen with Anti E and other Ab detected. Spoke with blood bank who indicate that they may have matching blood here, or may have to order it from [place].  -if transfusion required, talk to blood bank asap so that they can locate appropriate blood -per nephro, erythropoiesis-stimulating agents and holding venofer for acute infection -may need hematology consult no clear source of blood loss can be identified -SCDs for DVT PPx  HTN Home meds of amlodipine 97m daily, clonidine 0.368mTID, hydralazine 2578mID. Normotensive at this time. -restart clonidine to prevent reflex tach -hold other home BP meds for now  Chronic HFpEF Echo with EF 50-55% in 2015. No symptoms or physical exam findings  suggesting HF exacerbation. -monitor for signs of HF   Dispo: Admit patient to Inpatient with expected length of stay greater than 2 midnights.  Signed: CheAndrew AuD 01/20/2020, 2:46 PM  Pager: [phone] 5pm on weekdays and 1pm on  weekends: On Call pager: [phone]

## 2020-01-20 NOTE — ED Triage Notes (Signed)
Pt here for generalized weakness, cough, and body aches x a few days. Denies sick contacts. Afib 100-170. Last full dialysis tx was Wednesday, received approx one hour of tx of Friday.

## 2020-01-20 NOTE — Progress Notes (Signed)
Notified Dr. Roslynn Amble in ED on pt;s HR status. New order received, "Go up on diliazem to 15mg  and give it 10-15 mins. As long as BP stable ok for HR 140's"

## 2020-01-20 NOTE — ED Notes (Signed)
Critical value:K of  7.1 reported by lab

## 2020-01-20 NOTE — ED Notes (Signed)
Blood bank advised, pt has antibody concerns for receiving blood.  Special blood would have to come from [place]. Pt not receiving blood at this time.  Consult blood bank if blood ordered while admitted.

## 2020-01-20 NOTE — ED Provider Notes (Signed)
[facility] EMERGENCY DEPARTMENT Provider Note   CSN: [id number] Arrival date & time: 01/20/20  0857     History Chief Complaint  Patient presents with  . Weakness  . Atrial Fibrillation    Jeremy Mccoy is a 54 y.o. male.  Presents to ER with concern for generalized weakness.  Has had nonproductive cough, generalized malaise over the past few days.  Reports that he only received part of his dialysis session on Friday due to not feeling well.  No fevers, no chest pain.  Has had some shortness of breath.  Has had some abdominal discomfort but no current abdominal pain.  No recent change in medication.  Not on blood thinners, denies history of A. fib.  HPI     Past Medical History:  Diagnosis Date  . Anemia   . Anxiety   . Arthritis    "qwhere" (05/15/2013)  . CHF (congestive heart failure) ([place])   . COPD (chronic obstructive pulmonary disease) ([place])   . Crack cocaine use   . Dental caries   . Depression   . ESRD (end stage renal disease) on dialysis [facility])    TTS: [address] [place] (05/15/2013)  . Headache(784.0)    "get it from going to dialysis; when they get real bad I come to hospital" (05/15/2013)  . Hematemesis/vomiting blood   . History of blood transfusion 10/2011; 04/2013  . HTN (hypertension) 06/12/2012  . Seizures ([place])   . Shortness of breath    "just when I have too much potassium is too high" (05/15/2013)    Patient Active Problem List   Diagnosis Date Noted  . Hyperkalemia, diminished renal excretion 01/20/2020  . ESRD (end stage renal disease) on dialysis ([place]) 10/20/2017  . Plantar fasciitis, bilateral 09/12/2017  . Hyperkalemia 02/23/2016  . Coughing blood   . Vasculitis ([place])   . Lung crackles   . Hemoptysis 07/25/2015  . Anemia 02/20/2014  . Symptomatic anemia 02/20/2014  . PAF (paroxysmal atrial fibrillation) ([place]) 01/31/2014  . Chronic diastolic congestive heart failure ([place]) 01/31/2014  . COPD (chronic obstructive  pulmonary disease) ([place]) 01/31/2014  . Thrombocytopenia ([place]) 01/23/2014  . Unspecified constipation 01/23/2014  . High anion gap metabolic acidosis 99/37/1696  . ESRD on hemodialysis ([place]) 11/05/2013  . Seizure disorder ([place]) 11/04/2013  . Protein-calorie malnutrition, severe ([place]) 07/12/2013  . Respiratory failure ([place]) 07/10/2013  . Status epilepticus ([place]) 07/10/2013  . Essential hypertension 07/10/2013  . Acute blood loss anemia 05/15/2013  . Hematemesis 05/15/2013  . Volume excess 09/20/2012  . Abnormal EKG 09/20/2012  . Drug abuse ([place]) 09/20/2012  . Elevated troponin 09/20/2012  . Anemia in chronic kidney disease 09/20/2012  . Pulmonary edema 06/12/2012  . Cocaine abuse ([place]) 06/12/2012  . End stage renal disease ([place]) 01/11/2012  . Tobacco abuse 11/04/2011  . Dental caries 11/04/2011  . Color blindness 11/04/2011    Past Surgical History:  Procedure Laterality Date  . AV FISTULA PLACEMENT  11/29/2011   Procedure: ARTERIOVENOUS (AV) FISTULA CREATION;  Surgeon: Angelia Mould, MD;  Location: [facility] OR;  Service: Vascular;  Laterality: Left;  . ESOPHAGOGASTRODUODENOSCOPY N/A 05/17/2013   Procedure: ESOPHAGOGASTRODUODENOSCOPY (EGD);  Surgeon: Beryle Beams, MD;  Location: [facility] ENDOSCOPY;  Service: Endoscopy;  Laterality: N/A;  . INGUINAL HERNIA REPAIR Bilateral 1967  . IR FLUORO GUIDE CV LINE RIGHT  10/20/2017  . IR THROMBECTOMY AV FISTULA W/THROMBOLYSIS/PTA INC/SHUNT/IMG LEFT Left 10/24/2017  . IR US GUIDE VASC ACCESS LEFT  10/24/2017  . IR  US GUIDE VASC ACCESS RIGHT  10/20/2017  . RENAL BIOPSY  11/07/2011       Family History  Problem Relation Age of Onset  . Heart attack Father     Social History   Tobacco Use  . Smoking status: Former Smoker    Packs/day: 1.00    Years: 50.00    Pack years: 50.00    Types: Cigars, Cigarettes  . Smokeless tobacco: Never Used  Substance Use Topics  . Alcohol use: No    Comment: 05/15/2013 "aien't drank in ~ 25 yrs"  . Drug  use: Yes    Types: Marijuana, Cocaine    Comment: Precription drugs (e.g. percocet) "I take what I have to to controll my constant pain" (05/15/2013)    Home Medications Prior to Admission medications   Medication Sig Start Date End Date Taking? Authorizing Provider  acetaminophen (TYLENOL) 500 MG tablet Take 1 tablet (500 mg total) by mouth every 8 (eight) hours as needed for mild pain or fever. 6 times a day, 500 mg, 12 to 24 tabs a day, 6,000 to 12,000 mg daily for pain Patient taking differently: Take 500 mg by mouth every 8 (eight) hours as needed for mild pain or fever.  08/05/16   Molt, Bethany, DO  amLODipine (NORVASC) 10 MG tablet Take 1 tablet (10 mg total) by mouth daily. 01/25/14   McLean-Scocuzza, Nino Glow, MD  aspirin 81 MG chewable tablet Chew 1 tablet (81 mg total) by mouth daily. 01/25/14   McLean-Scocuzza, Nino Glow, MD  B Complex-C-Zn-Folic Acid (DIALYVITE/ZINC) TABS Take by mouth. 09/11/19   [provider]  calcium carbonate (OS-CAL) 1250 (500 Ca) MG chewable tablet Chew by mouth.    [provider]  cinacalcet (SENSIPAR) 90 MG tablet  10/10/19   [provider]  cloNIDine (CATAPRES) 0.3 MG tablet Take 0.3 mg by mouth 3 (three) times daily.     [provider]  Diphenhydramine-APAP 12.5-325 MG TABS Take by mouth. 06/03/19 06/01/20  [provider]  doxercalciferol (HECTOROL) 0.5 MCG capsule Doxercalciferol (Hectorol) 11/22/19 11/20/20  [provider]  Etelcalcetide HCl (PARSABIV IV) Etelcalcetide Hermina Staggers) 11/13/19 11/09/20  [provider]  hydrALAZINE (APRESOLINE) 25 MG tablet Take 25 mg by mouth 3 (three) times daily. 07/20/19   [provider]  hydrOXYzine (ATARAX/VISTARIL) 25 MG tablet Take 25 mg by mouth 3 (three) times daily as needed for itching.  06/10/15   [provider]  Nutritional Supplements (FEEDING SUPPLEMENT, NEPRO CARB STEADY,) LIQD Take 237 mLs by mouth 3 (three) times daily as needed  (Supplement). 05/17/13   Mikhail, Velta Addison, DO  pantoprazole (PROTONIX) 20 MG tablet Take 20 mg by mouth daily. 02/21/16   [provider]  patiromer Daryll Drown) 8.4 g packet Take by mouth. 09/18/19   [provider]  polyethylene glycol (MIRALAX / GLYCOLAX) packet Take 17 g by mouth daily as needed for mild constipation. 08/05/16   Molt, Bethany, DO  ranitidine (ZANTAC) 300 MG tablet Take 300 mg by mouth daily as needed for heartburn.    [provider]  sevelamer carbonate (RENVELA) 800 MG tablet Take 2 tablets (1,600 mg total) by mouth 3 (three) times daily with meals. Patient taking differently: Take 2,400 mg by mouth 3 (three) times daily with meals. And with snacks 01/25/14   McLean-Scocuzza, Nino Glow, MD    Allergies    Patient has no known allergies.  Review of Systems   Review of Systems  Constitutional: Positive for chills and fatigue. Negative  for fever.  HENT: Negative for ear pain and sore throat.   Eyes: Negative for pain and visual disturbance.  Respiratory: Positive for cough and shortness of breath.   Cardiovascular: Positive for palpitations. Negative for chest pain.  Gastrointestinal: Positive for abdominal pain and nausea. Negative for vomiting.  Genitourinary: Negative for dysuria and hematuria.  Musculoskeletal: Negative for arthralgias and back pain.  Skin: Negative for color change and rash.  Neurological: Negative for seizures and syncope.  All other systems reviewed and are negative.   Physical Exam Updated Vital Signs BP 112/89 (BP Location: Left Arm)   Pulse 67   Temp 97.6 F (36.4 C) (Oral)   Resp 20   Ht 6' (1.829 m)   Wt 72 kg   SpO2 99%   BMI 21.53 kg/m   Physical Exam Vitals and nursing note reviewed.  Constitutional:      Appearance: He is well-developed.     Comments: Chronically ill-appearing but no distress  HENT:     Head: Normocephalic and atraumatic.  Eyes:     Conjunctiva/sclera: Conjunctivae normal.    Cardiovascular:     Rate and Rhythm: Tachycardia present. Rhythm irregular.     Pulses: Normal pulses.     Heart sounds: No murmur heard.   Pulmonary:     Comments: Mild tachypnea but no distress Abdominal:     Palpations: Abdomen is soft.     Comments: Some generalized tenderness but no rebound or guarding  Musculoskeletal:     Cervical back: Neck supple.     Comments: No edema bilaterally  Skin:    General: Skin is warm and dry.     Capillary Refill: Capillary refill takes less than 2 seconds.  Neurological:     General: No focal deficit present.     Mental Status: He is alert.  Psychiatric:        Mood and Affect: Mood normal.        Behavior: Behavior normal.     ED Results / Procedures / Treatments   Labs (all labs ordered are listed, but only abnormal results are displayed) Labs Reviewed  BASIC METABOLIC PANEL - Abnormal; Notable for the following components:      Result Value   Sodium 130 (*)    Potassium 7.1 (*)    Chloride 88 (*)    CO2 20 (*)    BUN 131 (*)    Creatinine, Ser 13.95 (*)    GFR calc non Af Amer 4 (*)    GFR calc Af Amer 4 (*)    Anion gap 22 (*)    All other components within normal limits  CBC - Abnormal; Notable for the following components:   RBC 2.80 (*)    Hemoglobin 7.3 (*)    HCT 24.6 (*)    MCHC 29.7 (*)    RDW 18.4 (*)    nRBC 0.5 (*)    All other components within normal limits  PROTIME-INR - Abnormal; Notable for the following components:   Prothrombin Time 16.7 (*)    INR 1.4 (*)    All other components within normal limits  SARS CORONAVIRUS 2 BY RT PCR (HOSPITAL ORDER, [facility])  CULTURE, BLOOD (SINGLE)  HEPATIC FUNCTION PANEL  LIPASE, BLOOD  BRAIN NATRIURETIC PEPTIDE  PROCALCITONIN  HIV ANTIBODY (ROUTINE TESTING W REFLEX)  TYPE AND SCREEN  TROPONIN I (HIGH SENSITIVITY)  TROPONIN I (HIGH SENSITIVITY)    EKG EKG Interpretation  Date/Time:  Monday January 20 2020 09:02:22  EDT Ventricular Rate:  143 PR Interval:    QRS Duration: 84 QT Interval:  260 QTC Calculation: 401 R Axis:   -82 Text Interpretation: Atrial fibrillation with rapid ventricular response Left axis deviation Septal infarct , age undetermined Abnormal ECG Confirmed by Madalyn Rob [phone]) on 01/20/2020 9:52:55 AM   Radiology CT ABDOMEN PELVIS WO CONTRAST  Result Date: 01/20/2020 CLINICAL DATA:  Diffuse abdominal pain. Concern for diverticulitis. Generalized weakness, cough, body aches for few days. EXAM: CT CHEST, ABDOMEN AND PELVIS WITHOUT CONTRAST TECHNIQUE: Multidetector CT imaging of the chest, abdomen and pelvis was performed following the standard protocol without IV contrast. COMPARISON:  Chest x-ray from earlier same day. Chest CT dated 07/25/2015. FINDINGS: CT CHEST FINDINGS Cardiovascular: No pericardial effusion. No thoracic aortic aneurysm. Densely calcified mitral valve annulus. Mediastinum/Nodes: Conglomerate lymphadenopathy within the mediastinum and bilateral perihilar regions, some calcified and some noncalcified. Esophagus is unremarkable. Trachea and central bronchi are unremarkable. Lungs/Pleura: Large dense consolidation within the RIGHT upper lobe, additional patchy smaller consolidations within the RIGHT upper lobe. RIGHT pleural effusion, small to moderate in size. Diffuse ground-glass opacities bilaterally, likely accentuated by areas of air trapping. Emphysematous changes bilaterally, mild to moderate in degree. Scattered calcified granulomas within each lung. No suspicious appearing pulmonary nodule or mass. No pneumothorax. Musculoskeletal: No acute findings. Diffuse heterogeneous mineralization of the osseous structures. CT ABDOMEN PELVIS FINDINGS Hepatobiliary: No focal liver abnormality is seen. Gallbladder is not well visualized. No bile duct dilatation is seen. Pancreas: Not well seen.  Grossly normal. Spleen: Normal in size without focal abnormality. Adrenals/Urinary  Tract: Both kidneys are atrophic. Stomach/Bowel: No dilated large or small bowel loops. Scattered diverticulosis but no focal inflammatory changes seen to suggest acute diverticulitis, although characterization of the bowel walls is limited by the lack of oral contrast. Appendix is not seen. Vascular/Lymphatic: Aortic atherosclerosis. No enlarged lymph nodes are visualized within the abdomen or pelvis. Reproductive: Prostate is unremarkable. Other: Moderate amount of ascites. No abscess collection identified. No free intraperitoneal air. Musculoskeletal: No acute findings. Diffuse heterogeneous mineralization of the osseous structures. IMPRESSION: 1. Large dense consolidation within the RIGHT upper lobe, with additional patchy smaller consolidations within the RIGHT upper lobe, consistent with multifocal pneumonia. 2. Conglomerate lymphadenopathy within the mediastinum and bilateral perihilar regions, some calcified and some noncalcified. This is most likely reactive in the absence of a known underlying malignancy, with a granulomatous etiology favored. 3. RIGHT pleural effusion, small to moderate in size. 4. Diffuse ground-glass opacities bilaterally, likely accentuated by areas of air trapping. Differential includes atypical pneumonias such as viral or fungal, interstitial pneumonias, edema related to volume overload/CHF, chronic interstitial diseases, hypersensitivity pneumonitis, and respiratory bronchiolitis. 5. Moderate amount of ascites. No abscess collection identified. No free intraperitoneal air. 6. Colonic diverticulosis without evidence of acute diverticulitis. 7. 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 8. Atrophic kidneys. Aortic Atherosclerosis (ICD10-I70.0) and Emphysema (ICD10-J43.9).  Electronically Signed   By: Franki Cabot M.D.   On: 01/20/2020 11:41   DG Chest 2 View  Result Date: 01/20/2020 CLINICAL DATA:  Generalized weakness EXAM: CHEST - 2 VIEW COMPARISON:  07/25/2019 FINDINGS: Focal right upper lobe consolidation. Diffuse interstitial opacity with fissure thickening. Cardiomegaly that is chronic. Sclerotic appearance of the bones attributed to patient's end-stage renal disease. IMPRESSION: Right upper lobe pneumonia. Followup PA  and lateral chest X-ray is recommended in 3-4 weeks following trial of antibiotic therapy to ensure resolution. Interstitial pulmonary edema. Electronically Signed   By: Monte Fantasia M.D.   On: 01/20/2020 09:42   CT Chest Wo Contrast  Result Date: 01/20/2020 CLINICAL DATA:  Diffuse abdominal pain. Concern for diverticulitis. Generalized weakness, cough, body aches for few days. EXAM: CT CHEST, ABDOMEN AND PELVIS WITHOUT CONTRAST TECHNIQUE: Multidetector CT imaging of the chest, abdomen and pelvis was performed following the standard protocol without IV contrast. COMPARISON:  Chest x-ray from earlier same day. Chest CT dated 07/25/2015. FINDINGS: CT CHEST FINDINGS Cardiovascular: No pericardial effusion. No thoracic aortic aneurysm. Densely calcified mitral valve annulus. Mediastinum/Nodes: Conglomerate lymphadenopathy within the mediastinum and bilateral perihilar regions, some calcified and some noncalcified. Esophagus is unremarkable. Trachea and central bronchi are unremarkable. Lungs/Pleura: Large dense consolidation within the RIGHT upper lobe, additional patchy smaller consolidations within the RIGHT upper lobe. RIGHT pleural effusion, small to moderate in size. Diffuse ground-glass opacities bilaterally, likely accentuated by areas of air trapping. Emphysematous changes bilaterally, mild to moderate in degree. Scattered calcified granulomas within each lung. No suspicious appearing pulmonary nodule or mass. No pneumothorax. Musculoskeletal: No  acute findings. Diffuse heterogeneous mineralization of the osseous structures. CT ABDOMEN PELVIS FINDINGS Hepatobiliary: No focal liver abnormality is seen. Gallbladder is not well visualized. No bile duct dilatation is seen. Pancreas: Not well seen.  Grossly normal. Spleen: Normal in size without focal abnormality. Adrenals/Urinary Tract: Both kidneys are atrophic. Stomach/Bowel: No dilated large or small bowel loops. Scattered diverticulosis but no focal inflammatory changes seen to suggest acute diverticulitis, although characterization of the bowel walls is limited by the lack of oral contrast. Appendix is not seen. Vascular/Lymphatic: Aortic atherosclerosis. No enlarged lymph nodes are visualized within the abdomen or pelvis. Reproductive: Prostate is unremarkable. Other: Moderate amount of ascites. No abscess collection identified. No free intraperitoneal air. Musculoskeletal: No acute findings. Diffuse heterogeneous mineralization of the osseous structures. IMPRESSION: 1. Large dense consolidation within the RIGHT upper lobe, with additional patchy smaller consolidations within the RIGHT upper lobe, consistent with multifocal pneumonia. 2. Conglomerate lymphadenopathy within the mediastinum and bilateral perihilar regions, some calcified and some noncalcified. This is most likely reactive in the absence of a known underlying malignancy, with a granulomatous etiology favored. 3. RIGHT pleural effusion, small to moderate in size. 4. Diffuse ground-glass opacities bilaterally, likely accentuated by areas of air trapping. Differential includes atypical pneumonias such as viral or fungal, interstitial pneumonias, edema related to volume overload/CHF, chronic interstitial diseases, hypersensitivity pneumonitis, and respiratory bronchiolitis. 5. Moderate amount of ascites. No abscess collection identified. No free intraperitoneal air. 6. Colonic diverticulosis without evidence of acute diverticulitis. 7. Diffuse  heterogeneous mineralization of the osseous structures, of uncertain etiology but most likely benign in the absence of a known primary malignancy, favor secondary renal osteodystrophy given the atrophic kidneys and chronic in stage renal disease. 8. Atrophic kidneys. Aortic Atherosclerosis (ICD10-I70.0) and Emphysema (ICD10-J43.9). Electronically Signed   By: Franki Cabot M.D.   On: 01/20/2020 11:41    Procedures .Critical Care Performed by: Lucrezia Starch, MD Authorized by: Lucrezia Starch, MD   Critical care provider statement:    Critical care time (minutes):  46   Critical care was necessary to treat or prevent imminent or life-threatening deterioration of the following conditions:  Cardiac failure, circulatory failure and metabolic crisis   Critical care was time spent personally by me on the following activities:  Discussions with consultants, evaluation of patient's response  to treatment, examination of patient, ordering and performing treatments and interventions, ordering and review of laboratory studies, ordering and review of radiographic studies, pulse oximetry, re-evaluation of patient's condition, obtaining history from patient or surrogate and review of old charts   (including critical care time)  Medications Ordered in ED Medications  diltiazem (CARDIZEM) 125 mg in dextrose 5% 125 mL (1 mg/mL) infusion (10 mg/hr Intravenous Rate/Dose Verify 01/20/20 1157)  cefTRIAXone (ROCEPHIN) 1 g in sodium chloride 0.9 % 100 mL IVPB (has no administration in time range)  azithromycin (ZITHROMAX) 500 mg in sodium chloride 0.9 % 250 mL IVPB (has no administration in time range)  Chlorhexidine Gluconate Cloth 2 % PADS 6 each (has no administration in time range)  doxercalciferol (HECTOROL) injection 3 mcg (has no administration in time range)  sodium chloride flush (NS) 0.9 % injection 3 mL (has no administration in time range)  acetaminophen (TYLENOL) tablet 650 mg (has no administration  in time range)    Or  acetaminophen (TYLENOL) suppository 650 mg (has no administration in time range)  polyethylene glycol (MIRALAX / GLYCOLAX) packet 17 g (has no administration in time range)  ondansetron (ZOFRAN) tablet 4 mg (has no administration in time range)    Or  ondansetron (ZOFRAN) injection 4 mg (has no administration in time range)  nicotine (NICODERM CQ - dosed in mg/24 hours) patch 14 mg (has no administration in time range)  azithromycin (ZITHROMAX) tablet 250 mg (has no administration in time range)  cefTRIAXone (ROCEPHIN) 1 g in sodium chloride 0.9 % 100 mL IVPB (has no administration in time range)  calcium chloride 1 g in sodium chloride 0.9 % 100 mL IVPB (0 g Intravenous Stopped 01/20/20 1155)  insulin aspart (novoLOG) injection 5 Units (5 Units Intravenous Given 01/20/20 1102)    And  dextrose 50 % solution 50 mL (50 mLs Intravenous Given 01/20/20 1101)  sodium bicarbonate injection 50 mEq (50 mEq Intravenous Given 01/20/20 1101)  albuterol (VENTOLIN HFA) 108 (90 Base) MCG/ACT inhaler 4 puff (4 puffs Inhalation Given 01/20/20 1102)  diltiazem (CARDIZEM) 1 mg/mL load via infusion 15 mg (15 mg Intravenous Bolus from Bag 01/20/20 1127)    ED Course  I have reviewed the triage vital signs and the nursing notes.  Pertinent labs & imaging results that were available during my care of the patient were reviewed by me and considered in my medical decision making (see chart for details).    MDM Rules/Calculators/A&P                         54 year old male ESRD presenting with cough, generalized malaise.  On exam patient not in distress but noted mild tachypnea, significant tachycardia.  EKG demonstrating A. fib RVR.  BP stable.  Not clear onset, proceeded with rate control with diltiazem bolus and infusion.  Blood work was concerning for hyperkalemia.  Suspect this is related to missed dialysis, recent illness.  Provided insulin with D50, albuterol, calcium and bicarb.  His chest x-ray  was concerning for pneumonia versus mass, on exam noted some mild abdominal tenderness as well.  CT chest abdomen pelvis was obtained which was negative for any acute abdominal pathology but was consistent with pneumonia.  His Covid test was negative.  Started on antibiotics for pneumonia.  Nephrology came to bedside and made arrangements for patient to receive urgent dialysis.  Consulted medicine, Dr. Venia Minks team with family medicine will admit patient for further management of his A. fib,  pneumonia, electrolyte derangements.   Final Clinical Impression(s) / ED Diagnoses Final diagnoses:  Hyperkalemia  Community acquired pneumonia, unspecified laterality  ESRD (end stage renal disease) ([place])  Atrial fibrillation, unspecified type [facility])    Rx / DC Orders ED Discharge Orders    None       Lucrezia Starch, MD 01/20/20 [phone]

## 2020-01-21 DIAGNOSIS — D631 Anemia in chronic kidney disease: Secondary | ICD-10-CM

## 2020-01-21 DIAGNOSIS — J918 Pleural effusion in other conditions classified elsewhere: Secondary | ICD-10-CM

## 2020-01-21 DIAGNOSIS — I120 Hypertensive chronic kidney disease with stage 5 chronic kidney disease or end stage renal disease: Secondary | ICD-10-CM

## 2020-01-21 DIAGNOSIS — E875 Hyperkalemia: Principal | ICD-10-CM

## 2020-01-21 DIAGNOSIS — F1410 Cocaine abuse, uncomplicated: Secondary | ICD-10-CM

## 2020-01-21 DIAGNOSIS — Z992 Dependence on renal dialysis: Secondary | ICD-10-CM

## 2020-01-21 DIAGNOSIS — N186 End stage renal disease: Secondary | ICD-10-CM

## 2020-01-21 LAB — COMPREHENSIVE METABOLIC PANEL
ALT: 276 U/L — ABNORMAL HIGH (ref 0–44)
AST: 76 U/L — ABNORMAL HIGH (ref 15–41)
Albumin: 2.6 g/dL — ABNORMAL LOW (ref 3.5–5.0)
Alkaline Phosphatase: 123 U/L (ref 38–126)
Anion gap: 17 — ABNORMAL HIGH (ref 5–15)
BUN: 59 mg/dL — ABNORMAL HIGH (ref 6–20)
CO2: 25 mmol/L (ref 22–32)
Calcium: 9.6 mg/dL (ref 8.9–10.3)
Chloride: 97 mmol/L — ABNORMAL LOW (ref 98–111)
Creatinine, Ser: 8.16 mg/dL — ABNORMAL HIGH (ref 0.61–1.24)
GFR calc Af Amer: 8 mL/min — ABNORMAL LOW (ref >60)
GFR calc non Af Amer: 7 mL/min — ABNORMAL LOW (ref >60)
Glucose, Bld: 114 mg/dL — ABNORMAL HIGH (ref 70–99)
Potassium: 5.1 mmol/L (ref 3.5–5.1)
Sodium: 139 mmol/L (ref 135–145)
Total Bilirubin: 0.5 mg/dL (ref 0.3–1.2)
Total Protein: 6.4 g/dL — ABNORMAL LOW (ref 6.5–8.1)

## 2020-01-21 LAB — CBC
HCT: 23.6 % — ABNORMAL LOW (ref 39.0–52.0)
Hemoglobin: 7 g/dL — ABNORMAL LOW (ref 13.0–17.0)
MCH: 27.1 pg (ref 26.0–34.0)
MCHC: 29.7 g/dL — ABNORMAL LOW (ref 30.0–36.0)
MCV: 91.5 fL (ref 80.0–100.0)
Platelets: 259 10*3/uL (ref 150–400)
RBC: 2.58 MIL/uL — ABNORMAL LOW (ref 4.22–5.81)
RDW: 18.9 % — ABNORMAL HIGH (ref 11.5–15.5)
WBC: 7.2 10*3/uL (ref 4.0–10.5)
nRBC: 0.4 % — ABNORMAL HIGH (ref 0.0–0.2)

## 2020-01-21 LAB — BILIRUBIN, DIRECT: Bilirubin, Direct: <0.1 mg/dL (ref 0.0–0.2)

## 2020-01-21 LAB — PHOSPHORUS: Phosphorus: 6.7 mg/dL — ABNORMAL HIGH (ref 2.5–4.6)

## 2020-01-21 MED ORDER — CHLORHEXIDINE GLUCONATE CLOTH 2 % EX PADS: 6.0000 | MEDICATED_PAD | Freq: Every day | CUTANEOUS | Status: DC

## 2020-01-21 MED ORDER — POLYETHYLENE GLYCOL 3350 17 G PO PACK
17.0000 g | PACK | Freq: Two times a day (BID) | ORAL | Status: DC
  Administered 2020-01-21 – 2020-01-23 (×4): 17 g via ORAL
  Filled 2020-01-21 (×4): qty 1

## 2020-01-21 NOTE — Progress Notes (Signed)
   01/20/20 2017  Assess: MEWS Score  Temp 98.1 F (36.7 C)  BP 121/75  Pulse Rate 85  ECG Heart Rate (!) 122  Resp 18  SpO2 97 %  Assess: MEWS Score  MEWS Temp 0  MEWS Systolic 0  MEWS Pulse 2  MEWS RR 0  MEWS LOC 0  MEWS Score 2  MEWS Score Color Yellow  Assess: if the MEWS score is Yellow or Red  Were vital signs taken at a resting state? Yes  Focused Assessment No change from prior assessment  Early Detection of Sepsis Score *See Row Information* Low  MEWS guidelines implemented *See Row Information* Yes  Take Vital Signs  Increase Vital Sign Frequency  Yellow: Q 2hr X 2 then Q 4hr X 2, if remains yellow, continue Q 4hrs  Escalate  MEWS: Escalate Yellow: discuss with charge nurse/RN and consider discussing with provider and RRT  Notify: Charge Nurse/RN  Name of Charge Nurse/RN Notified Jamal Collin, RN  Date Charge Nurse/RN Notified 01/20/20  Time Charge Nurse/RN Notified 2040  Document  Patient Outcome Stabilized after interventions  Progress note created (see row info) Yes

## 2020-01-21 NOTE — Progress Notes (Addendum)
Subjective:   Patient states that he feels very well today. No hemoptysis today or yesterday, no pain with breathing. Denies any other source of bleeding in the past 2 weeks including bowel or nosebleed. However, he does note he continues to cough. Denies any palpitations. He endorses a history of fluid in his lungs but denies any known history of PNA. He says his last bowel movement was about 2 days ago.  Objective:  Vital signs in last 24 hours: Vitals:   01/20/20 2017 01/21/20 0005 01/21/20 0357 01/21/20 0400  BP: 121/75 (!) 125/91 116/77 116/77  Pulse: 85 66 98 79  Resp: 18 18 18    Temp: 98.1 F (36.7 C) 98.9 F (37.2 C) 99.1 F (37.3 C)   TempSrc: Oral Oral Oral   SpO2: 97% 98% 93% 96%  Weight:   70.2 kg   Height:        Physical Exam Constitutional: no acute distress Head: atraumatic ENT: external ears normal Eyes: EOMI Cardiovascular: rate is normal, rhythm is irregularly irregular, normal heart sounds Pulmonary: effort normal, lungs clear to ascultation bilaterally Abdominal: flat, nontender, no rebound tenderness, bowel sounds normal Skin: warm and dry Neurological: alert, no focal deficit Psychiatric: normal mood and affect  Assessment/Plan: Mr. Colaizzi is a 54 y/o M with hx significant for ESRD on MWF dialysis, HTN, chronic cocaine use presenting with cc of generalized weakness and body aches, also with cough for several weeks. Found to have pneumonia on imaging, treated with Abx. Receiving dialysis here. Also has new onset A fib with RVR, rate controlled with diltiazem. Slowly declining hemoglobin with hx of chronic anemia. Reports feeling much better than at admission.  Active Problems:   Cocaine abuse (HCC)   Anemia in chronic kidney disease   Essential hypertension   ESRD on hemodialysis (HCC)   PAF (paroxysmal atrial fibrillation) (HCC)   Hyperkalemia, diminished renal excretion   Pneumonia  Pneumonia Pleural effusion, new On CXR found to have a RUL  consolidation and diffuse interstitial opacities, as well as small to moderate pleural effusion, lymphadenopathy, and small ascites. CT chest confirmed this, and also shows moderate R-sided pleural effusion. No fevers or chills at home. Baseline procalcitonin of 5.08. -continue azithromycin and ceftriaxone, day 2 -f/u CT to ensure resolution of lymphadenopathy, pleural effusion, and ascites  Normocytic anemia, 2/2 renal disease Hgb of 7.0 < 7.3.. INR 1.4. Anemia has been an ongoing issue and he is on max dose mircera. No significant sources of bleeding. Received 2 units pRBC outpatient on 9/2. Type and screen with Anti E and other Ab detected. Spoke with blood bank who indicate that they may have matching blood here, or may have to order it from [facility].  -if transfusion required, talk to blood bank asap so that they can locate appropriate blood -per nephro, erythropoiesis-stimulating agents and holding venofer for acute infection -may need hematology consult no clear source of blood loss can be identified -SCDs for DVT PPx  Paroxysmal A fib Pulse now 60s-80s and irregularly irregular. Rate controlled with diltiazem drip. Hx of transient A fib in 2015 2/2 volume overload and metabolic derangements from missed dialysis which resolved after correcting these. Expecting that this will resolve with dialysis and treatment of pneumonia. -continue diltiazem drip -if A fib does not resolve in next day, consider echo for further evaluation -telemetry  Hyperkalemia ESRD, on MWF dialysis K of 5.1 < 7.1 s/p dialysis yesterday. Apparently chronically has problem tolerating his entire dialysis session, and on Friday had  a short session due to access issues. -dialysis per nephro -daily CMP  HTN Home meds of amlodipine 10mg  daily, clonidine 0.3mg  TID, hydralazine 25mg  TID. Normotensive at this time. -continue home clonidine 0.3mg  TID -hold other home BP meds for now  Diet: renal with fluid  restriction IVF: n/a VTE: SCDs due to low hgb Prior to Admission Living Arrangement: home Anticipated Discharge Location: home Barriers to Discharge: anemia, pneumonia, hyperkalemia Dispo: Anticipated discharge in approximately 1-2 day(s).   Andrew Au, MD 01/21/2020, 6:48 AM Pager: [phone] After 5pm on weekdays and 1pm on weekends: On Call pager [phone]

## 2020-01-21 NOTE — Progress Notes (Addendum)
Jeremy [facility] Progress Note   Subjective:   Patient seen in room. Reports he is feeling about 50% better but did have some shortness of breath with self care tasks this AM. No SOB at rest, CP, palpitations, dizziness, abdominal pain, N/V. Generalized pain is improving. HR currently controlled.   Objective Vitals:   01/21/20 0357 01/21/20 0400 01/21/20 0819 01/21/20 1115  BP: 116/77 116/77 (!) 113/94 102/80  Pulse: 98 79 92 66  Resp: 18  18 17   Temp: 99.1 F (37.3 C)  98.9 F (37.2 C) 97.8 F (36.6 C)  TempSrc: Oral  Oral Oral  SpO2: 93% 96% 95% 94%  Weight: 70.2 kg     Height:       Physical Exam General: Well developed male, alert and in NAD Heart: RRR, no murmurs, rubs or gallops Lungs: Crackles R upper lobe, no wheezing or rales auscultated Abdomen: Soft, non-tender, non-distended, +BS Extremities: No edema b/l lower extremities Dialysis Access: LUE AVF + bruit, superficial bruising improving  Additional Objective Labs: Basic Metabolic Panel: Recent Labs  Lab 01/20/20 0910 01/21/20 0550  NA 130* 139  K 7.1* 5.1  CL 88* 97*  CO2 20* 25  GLUCOSE 96 114*  BUN 131* 59*  CREATININE 13.95* 8.16*  CALCIUM 9.6 9.6   Liver Function Tests: Recent Labs  Lab 01/20/20 1827 01/21/20 0550  AST 114* 76*  ALT 331* 276*  ALKPHOS 129* 123  BILITOT 0.6 0.5  PROT 7.0 6.4*  ALBUMIN 2.7* 2.6*   Recent Labs  Lab 01/20/20 1827  LIPASE 25   CBC: Recent Labs  Lab 01/20/20 0910 01/21/20 0550  WBC 8.5 7.2  HGB 7.3* 7.0*  HCT 24.6* 23.6*  MCV 87.9 91.5  PLT 283 259   Blood Culture    Component Value Date/Time   SDES BLOOD RIGHT ARM 01/20/2020 1827   SPECREQUEST  01/20/2020 1827    BOTTLES DRAWN AEROBIC ONLY Blood Culture adequate volume   CULT  01/20/2020 1827    NO GROWTH < 24 HOURS Performed at [facility], [place] [address], [place], [place] [address]    REPTSTATUS PENDING 01/20/2020 1827    Studies/Results: CT ABDOMEN PELVIS WO  CONTRAST  Result Date: 01/20/2020 CLINICAL DATA:  Diffuse abdominal pain. Concern for diverticulitis. Generalized weakness, cough, body aches for few days. EXAM: CT CHEST, ABDOMEN AND PELVIS WITHOUT CONTRAST TECHNIQUE: Multidetector CT imaging of the chest, abdomen and pelvis was performed following the standard protocol without IV contrast. COMPARISON:  Chest x-ray from earlier same day. Chest CT dated 07/25/2015. FINDINGS: CT CHEST FINDINGS Cardiovascular: No pericardial effusion. No thoracic aortic aneurysm. Densely calcified mitral valve annulus. Mediastinum/Nodes: Conglomerate lymphadenopathy within the mediastinum and bilateral perihilar regions, some calcified and some noncalcified. Esophagus is unremarkable. Trachea and central bronchi are unremarkable. Lungs/Pleura: Large dense consolidation within the RIGHT upper lobe, additional patchy smaller consolidations within the RIGHT upper lobe. RIGHT pleural effusion, small to moderate in size. Diffuse ground-glass opacities bilaterally, likely accentuated by areas of air trapping. Emphysematous changes bilaterally, mild to moderate in degree. Scattered calcified granulomas within each lung. No suspicious appearing pulmonary nodule or mass. No pneumothorax. Musculoskeletal: No acute findings. Diffuse heterogeneous mineralization of the osseous structures. CT ABDOMEN PELVIS FINDINGS Hepatobiliary: No focal liver abnormality is seen. Gallbladder is not well visualized. No bile duct dilatation is seen. Pancreas: Not well seen.  Grossly normal. Spleen: Normal in size without focal abnormality. Adrenals/Urinary Tract: Both kidneys are atrophic. Stomach/Bowel: No dilated large or small bowel loops.  Scattered diverticulosis but no focal inflammatory changes seen to suggest acute diverticulitis, although characterization of the bowel walls is limited by the lack of oral contrast. Appendix is not seen. Vascular/Lymphatic: Aortic atherosclerosis. No enlarged lymph nodes  are visualized within the abdomen or pelvis. Reproductive: Prostate is unremarkable. Other: Moderate amount of ascites. No abscess collection identified. No free intraperitoneal air. Musculoskeletal: No acute findings. Diffuse heterogeneous mineralization of the osseous structures. IMPRESSION: 1. Large dense consolidation within the RIGHT upper lobe, with additional patchy smaller consolidations within the RIGHT upper lobe, consistent with multifocal pneumonia. 2. Conglomerate lymphadenopathy within the mediastinum and bilateral perihilar regions, some calcified and some noncalcified. This is most likely reactive in the absence of a known underlying malignancy, with a granulomatous etiology favored. 3. RIGHT pleural effusion, small to moderate in size. 4. Diffuse ground-glass opacities bilaterally, likely accentuated by areas of air trapping. Differential includes atypical pneumonias such as viral or fungal, interstitial pneumonias, edema related to volume overload/CHF, chronic interstitial diseases, hypersensitivity pneumonitis, and respiratory bronchiolitis. 5. Moderate amount of ascites. No abscess collection identified. No free intraperitoneal air. 6. Colonic diverticulosis without evidence of acute diverticulitis. 7. 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 8. Atrophic kidneys. Aortic Atherosclerosis (ICD10-I70.0) and Emphysema (ICD10-J43.9). Electronically Signed   By: Franki Cabot M.D.   On: 01/20/2020 11:41   DG Chest 2 View  Result Date: 01/20/2020 CLINICAL DATA:  Generalized weakness EXAM: CHEST - 2 VIEW COMPARISON:  07/25/2019 FINDINGS: Focal right upper lobe consolidation. Diffuse interstitial opacity with fissure thickening. Cardiomegaly that is chronic. Sclerotic appearance of the bones  attributed to patient's end-stage renal disease. IMPRESSION: Right upper lobe pneumonia. Followup PA and lateral chest X-ray is recommended in 3-4 weeks following trial of antibiotic therapy to ensure resolution. Interstitial pulmonary edema. Electronically Signed   By: Monte Fantasia M.D.   On: 01/20/2020 09:42   CT Chest Wo Contrast  Result Date: 01/20/2020 CLINICAL DATA:  Diffuse abdominal pain. Concern for diverticulitis. Generalized weakness, cough, body aches for few days. EXAM: CT CHEST, ABDOMEN AND PELVIS WITHOUT CONTRAST TECHNIQUE: Multidetector CT imaging of the chest, abdomen and pelvis was performed following the standard protocol without IV contrast. COMPARISON:  Chest x-ray from earlier same day. Chest CT dated 07/25/2015. FINDINGS: CT CHEST FINDINGS Cardiovascular: No pericardial effusion. No thoracic aortic aneurysm. Densely calcified mitral valve annulus. Mediastinum/Nodes: Conglomerate lymphadenopathy within the mediastinum and bilateral perihilar regions, some calcified and some noncalcified. Esophagus is unremarkable. Trachea and central bronchi are unremarkable. Lungs/Pleura: Large dense consolidation within the RIGHT upper lobe, additional patchy smaller consolidations within the RIGHT upper lobe. RIGHT pleural effusion, small to moderate in size. Diffuse ground-glass opacities bilaterally, likely accentuated by areas of air trapping. Emphysematous changes bilaterally, mild to moderate in degree. Scattered calcified granulomas within each lung. No suspicious appearing pulmonary nodule or mass. No pneumothorax. Musculoskeletal: No acute findings. Diffuse heterogeneous mineralization of the osseous structures. CT ABDOMEN PELVIS FINDINGS Hepatobiliary: No focal liver abnormality is seen. Gallbladder is not well visualized. No bile duct dilatation is seen. Pancreas: Not well seen.  Grossly normal. Spleen: Normal in size without focal abnormality. Adrenals/Urinary Tract: Both kidneys are  atrophic. Stomach/Bowel: No dilated large or small bowel loops. Scattered diverticulosis but no focal inflammatory changes seen to suggest acute diverticulitis, although characterization of the bowel walls is limited by the lack  of oral contrast. Appendix is not seen. Vascular/Lymphatic: Aortic atherosclerosis. No enlarged lymph nodes are visualized within the abdomen or pelvis. Reproductive: Prostate is unremarkable. Other: Moderate amount of ascites. No abscess collection identified. No free intraperitoneal air. Musculoskeletal: No acute findings. Diffuse heterogeneous mineralization of the osseous structures. IMPRESSION: 1. Large dense consolidation within the RIGHT upper lobe, with additional patchy smaller consolidations within the RIGHT upper lobe, consistent with multifocal pneumonia. 2. Conglomerate lymphadenopathy within the mediastinum and bilateral perihilar regions, some calcified and some noncalcified. This is most likely reactive in the absence of a known underlying malignancy, with a granulomatous etiology favored. 3. RIGHT pleural effusion, small to moderate in size. 4. Diffuse ground-glass opacities bilaterally, likely accentuated by areas of air trapping. Differential includes atypical pneumonias such as viral or fungal, interstitial pneumonias, edema related to volume overload/CHF, chronic interstitial diseases, hypersensitivity pneumonitis, and respiratory bronchiolitis. 5. Moderate amount of ascites. No abscess collection identified. No free intraperitoneal air. 6. Colonic diverticulosis without evidence of acute diverticulitis. 7. 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 8. Atrophic kidneys. Aortic Atherosclerosis (ICD10-I70.0) and Emphysema (ICD10-J43.9). Electronically Signed   By:  Franki Cabot M.D.   On: 01/20/2020 11:41   Medications: . cefTRIAXone (ROCEPHIN)  IV    . diltiazem (CARDIZEM) infusion 15 mg/hr (01/21/20 1104)   . aspirin  81 mg Oral Daily  . azithromycin  250 mg Oral Daily  . Chlorhexidine Gluconate Cloth  6 each Topical Q0600  . cloNIDine  0.3 mg Oral TID  . [START ON 01/22/2020] doxercalciferol  3 mcg Intravenous Q M,W,F-HD  . nicotine  14 mg Transdermal Daily  . pantoprazole  20 mg Oral Daily  . polyethylene glycol  17 g Oral BID  . sodium chloride flush  3 mL Intravenous Q12H    Dialysis Orders: Center: [facility] on MWF. MWF 180NRe Time: 4:15 hrs, BFR 500, DFR 800, EDW 67.5kg, 1K/2.5Ca, AVF 15g, no heparin Mircera 255mcg IV q 2 weeks- last dose 8/30 Venofer 100mg  IV q HD 9/3-9/24 Parsabiv 15mg  IV q HD hectorol 3 mcg IV q HD  Assessment/Plan: 1.  Pneumonia: CT chest showing large R upper lobe infiltrate with patchy scattered infiltrates. Started on zithromycin and ceftriaxone. Also likely has a component of pulmonary edema, SOB improved after 4L UF yesterday. 2. Hyperkalemia: K+ 7.1 on admission, improved to 5.1 after HD. Of note, this is not far from his baseline- outpatient K+ has been 6.9 since June. Prescribed veltassa outpatient but admits he does not take it. Patient has been counseled about importance of low K+ diet and full dialysis many times.  3. A. Fib with RVR: New onset, on diltiazem and currently rate controlled, likely exacerbated by acute illness.   4.  ESRD:  On MWF schedule but chronically shortens his treatments to 2-3 hours. Had HD yest, next HD tomorrow. Get down to dry wt , still 3-4 kg+ 5.  Hypertension/volume: UF 4L yesterday with HD, breathing improved. Continue to titrate volume as tolerated. Continue home meds.   6.  Anemia: Hgb 7, received 2 units PRBC on 9/2 and max dose mircera on 01/13/20. Denies any blood loss including melena. Transfuse PRN. Apparently told PA he was coughing up blood on 8/27 but  this has stopped. May need hematology consult if no clear source of blood loss can  be identified. Holding venofer due to acute infection. Transfuse PRN- per notes does have antibodies.  7.  Metabolic bone disease: Corrected calcium high, hold hectorol. Parsabiv not on formulary. . Last outpatient phos 6.3. Continue renvela 6.3.  8.  Nutrition:  Renal diet/fluid restrictions recommended.  9. Cocaine use: Reports last use about 1 week ago. He recently started attending church with a new friend who is sober, and plans to move in with this friend soon (friend lives in [place]). He does not want to change outpatient dialysis units.   Anice Paganini, PA-C 01/21/2020, 12:29 PM  [facility] Pager: [phone]  Pt seen, examined and agree w A/P as above.  Kelly Splinter  MD 01/21/2020, 2:24 PM

## 2020-01-21 NOTE — Evaluation (Signed)
Occupational Therapy Evaluation Patient Details Name: Jeremy Mccoy MRN: [id number] DOB: 22-Jul-1965 Today's Date: 01/21/2020    History of Present Illness 54 y.o. male.  Presents to ER with concern for generalized weakness.  Has had nonproductive cough, generalized malaise over the past few days.  Reports that he only received part of his dialysis session on Friday due to not feeling well.  No fevers, no chest pain.  Has had some shortness of breath.  Has had some abdominal discomfort but no current abdominal pain.  PMH includes: Anemia, Anxiety, CHF, COPD, ESRD on HD.     Clinical Impression   Patient presents at or near baseline function for basic in room mobility, ADL and toileting skills.  Patient is stating he feels much better this date and is hoping to discharge soon, HR and mild chest congestion are his only complaints.  He states he is currently living alone, but is in the process of moving to a rental home with two roommates.  Patient was driving, caring for meds and required no assist with functional tasks.  Patient does not require acute OT, nor will he require follow OT in the home setting.  Mild impulsivity noted, but this appears to be his baseline as well.      Follow Up Recommendations  No OT follow up    Equipment Recommendations  Tub/shower seat    Recommendations for Other Services       Precautions / Restrictions Precautions Precautions: Fall Precaution Comments: mild impulsivity. Restrictions Weight Bearing Restrictions: No      Mobility Bed Mobility Overal bed mobility: Independent             General bed mobility comments: VC's for lines and leads  Transfers Overall transfer level: Independent                    Balance Overall balance assessment: Independent                                         ADL either performed or assessed with clinical judgement   ADL Overall ADL's : At baseline                                        General ADL Comments: general cues for safety with respect to lines and leads, otherwise no assist neeeded.     Vision Baseline Vision/History: Wears glasses Wears Glasses: Reading only Vision Assessment?: No apparent visual deficits     Perception     Praxis      Pertinent Vitals/Pain Pain Assessment: No/denies pain     Hand Dominance Right   Extremity/Trunk Assessment Upper Extremity Assessment Upper Extremity Assessment: Overall WFL for tasks assessed       Cervical / Trunk Assessment Cervical / Trunk Assessment: Normal   Communication Communication Communication: No difficulties   Cognition Arousal/Alertness: Awake/alert Behavior During Therapy: WFL for tasks assessed/performed Overall Cognitive Status: Within Functional Limits for tasks assessed                                 General Comments: Baseline impulsive   General Comments  brusing to B UE's.  Pt had an issue with fistual at last HD appointment.  Exercises     Shoulder Instructions      Home Living Family/patient expects to be discharged to:: Private residence Living Arrangements: Other (Comment) (He is in the process of moving to a rental home with 2 roommates)   Type of Home: House Home Access: Level entry     Home Layout: One level     Bathroom Shower/Tub: Teacher, early years/pre: Standard     Home Equipment: None          Prior Functioning/Environment Level of Independence: Independent        Comments: Pt continues to receive HD        OT Problem List: Decreased safety awareness      OT Treatment/Interventions:      OT Goals(Current goals can be found in the care plan section) Acute Rehab OT Goals Patient Stated Goal: Patient is looking forward to moving to new residence. OT Goal Formulation: With patient Time For Goal Achievement: 01/31/20 Potential to Achieve Goals: Good  OT Frequency:     Barriers to D/C:  Elevated HR          Co-evaluation              AM-PAC OT "6 Clicks" Daily Activity     Outcome Measure Help from another person eating meals?: None Help from another person taking care of personal grooming?: None Help from another person toileting, which includes using toliet, bedpan, or urinal?: None Help from another person bathing (including washing, rinsing, drying)?: None Help from another person to put on and taking off regular upper body clothing?: None Help from another person to put on and taking off regular lower body clothing?: None 6 Click Score: 24   End of Session Nurse Communication: Mobility status  Activity Tolerance: Patient tolerated treatment well Patient left: in chair;with call bell/phone within reach;with chair alarm set  OT Visit Diagnosis: Other (comment) (HR and congestion)                Time: 1610-9604 OT Time Calculation (min): 33 min Charges:  OT General Charges $OT Visit: 1 Visit OT Evaluation $OT Eval Moderate Complexity: 1 Mod  01/21/2020  Rich, OTR/L  Acute Rehabilitation Services  Office:  [phone]   Metta Clines 01/21/2020, 8:57 AM

## 2020-01-21 NOTE — Evaluation (Signed)
Physical Therapy Evaluation Patient Details Name: Jeremy Mccoy MRN: [id number] DOB: 1965-08-25 Today's Date: 01/21/2020   History of Present Illness  54 y.o. male.  Presents to ER with concern for generalized weakness.  Has had nonproductive cough, generalized malaise over the past few days.  Reports that he only received part of his dialysis session on Friday due to not feeling well.  No fevers, no chest pain.  Has had some shortness of breath.  Has had some abdominal discomfort but no current abdominal pain.  PMH includes: Anemia, Anxiety, CHF,COPD and ESRD on HD.  Clinical Impression  Pt is at or close to baseline functioning and should be safe at home. There are no further acute PT needs.  Will sign off at this time.     Follow Up Recommendations No PT follow up    Equipment Recommendations  None recommended by PT    Recommendations for Other Services       Precautions / Restrictions Precautions Precautions: Fall Precaution Comments: mild impulsivity.      Mobility  Bed Mobility Overal bed mobility: Independent                Transfers Overall transfer level: Independent                  Ambulation/Gait Ambulation/Gait assistance: Independent Gait Distance (Feet): 400 Feet Assistive device: None Gait Pattern/deviations: Step-through pattern   Gait velocity interpretation: >4.37 ft/sec, indicative of normal walking speed General Gait Details: steady in general.  EHR in the 80's, SpO2 on RA 94%  mildly dyspneic after gait and stairs.  Stairs Stairs: Yes Stairs assistance: Modified independent (Device/Increase time) Stair Management: One rail Right;No rails;Alternating pattern;Step to pattern;Forwards Number of Stairs: 3 General stair comments: safe with rails, able without rails, but with less control  Wheelchair Mobility    Modified Rankin (Stroke Patients Only)       Balance Overall balance assessment: Independent                                Standardized Balance Assessment Standardized Balance Assessment : Dynamic Gait Index;Berg Balance Test Berg Balance Test Sit to Stand: Able to stand without using hands and stabilize independently Standing Unsupported: Able to stand safely 2 minutes Sitting with Back Unsupported but Feet Supported on Floor or Stool: Able to sit safely and securely 2 minutes Stand to Sit: Sits safely with minimal use of hands Transfers: Able to transfer safely, minor use of hands Standing Unsupported with Eyes Closed: Able to stand 10 seconds safely Standing Ubsupported with Feet Together: Able to place feet together independently and stand 1 minute safely From Standing, Reach Forward with Outstretched Arm: Can reach confidently >25 cm (10") From Standing Position, Pick up Object from Floor: Able to pick up shoe safely and easily From Standing Position, Turn to Look Behind Over each Shoulder: Looks behind from both sides and weight shifts well Turn 360 Degrees: Able to turn 360 degrees safely in 4 seconds or less Standing Unsupported, Alternately Place Feet on Step/Stool: Able to stand independently and safely and complete 8 steps in 20 seconds Standing Unsupported, One Foot in Front: Able to place foot tandem independently and hold 30 seconds Standing on One Leg: Able to lift leg independently and hold equal to or more than 3 seconds Total Score: 54 Dynamic Gait Index Level Surface: Normal Change in Gait Speed: Normal Gait with Horizontal Head Turns: Normal  Gait with Vertical Head Turns: Normal Gait and Pivot Turn: Normal Step Over Obstacle: Normal Step Around Obstacles: Mild Impairment Steps: Mild Impairment Total Score: 22       Pertinent Vitals/Pain Pain Assessment: No/denies pain    Home Living Family/patient expects to be discharged to:: Private residence Living Arrangements: Other (Comment)   Type of Home: House Home Access: Level entry     Home Layout: One  level Home Equipment: None      Prior Function Level of Independence: Independent         Comments: Pt continues to receive HD     Hand Dominance   Dominant Hand: Right    Extremity/Trunk Assessment   Upper Extremity Assessment Upper Extremity Assessment: Overall WFL for tasks assessed    Lower Extremity Assessment Lower Extremity Assessment: Overall WFL for tasks assessed    Cervical / Trunk Assessment Cervical / Trunk Assessment: Normal  Communication   Communication: No difficulties  Cognition Arousal/Alertness: Awake/alert Behavior During Therapy: WFL for tasks assessed/performed Overall Cognitive Status: Within Functional Limits for tasks assessed                                        General Comments General comments (skin integrity, edema, etc.): vss  heavy bil arm bruising.    Exercises     Assessment/Plan    PT Assessment Patent does not need any further PT services  PT Problem List         PT Treatment Interventions      PT Goals (Current goals can be found in the Care Plan section)  Acute Rehab PT Goals PT Goal Formulation: All assessment and education complete, DC therapy    Frequency     Barriers to discharge        Co-evaluation               AM-PAC PT "6 Clicks" Mobility  Outcome Measure Help needed turning from your back to your side while in a flat bed without using bedrails?: None Help needed moving from lying on your back to sitting on the side of a flat bed without using bedrails?: None Help needed moving to and from a bed to a chair (including a wheelchair)?: None Help needed standing up from a chair using your arms (e.g., wheelchair or bedside chair)?: None Help needed to walk in hospital room?: None Help needed climbing 3-5 steps with a railing? : None 6 Click Score: 24    End of Session   Activity Tolerance: Patient tolerated treatment well Patient left: in bed;with call bell/phone within  reach;Other (comment) (sitting EOB) Nurse Communication: Mobility status PT Visit Diagnosis: Unsteadiness on feet (R26.81)    Time: 9628-3662 PT Time Calculation (min) (ACUTE ONLY): 23 min   Charges:   PT Evaluation $PT Eval Low Complexity: 1 Low PT Treatments $Gait Training: 8-22 mins        01/21/2020  Ginger Carne., PT Acute Rehabilitation Services [phone]  (pager) [phone]  (office)  Tessie Fass Albertia Carvin 01/21/2020, 2:37 PM

## 2020-01-22 ENCOUNTER — Other Ambulatory Visit (HOSPITAL_COMMUNITY): Payer: Medicare HMO

## 2020-01-22 DIAGNOSIS — I480 Paroxysmal atrial fibrillation: Secondary | ICD-10-CM

## 2020-01-22 DIAGNOSIS — D649 Anemia, unspecified: Secondary | ICD-10-CM

## 2020-01-22 DIAGNOSIS — J189 Pneumonia, unspecified organism: Secondary | ICD-10-CM

## 2020-01-22 DIAGNOSIS — R195 Other fecal abnormalities: Secondary | ICD-10-CM

## 2020-01-22 LAB — COMPREHENSIVE METABOLIC PANEL
ALT: 212 U/L — ABNORMAL HIGH (ref 0–44)
AST: 56 U/L — ABNORMAL HIGH (ref 15–41)
Albumin: 2.5 g/dL — ABNORMAL LOW (ref 3.5–5.0)
Alkaline Phosphatase: 128 U/L — ABNORMAL HIGH (ref 38–126)
Anion gap: 15 (ref 5–15)
BUN: 84 mg/dL — ABNORMAL HIGH (ref 6–20)
CO2: 23 mmol/L (ref 22–32)
Calcium: 9.7 mg/dL (ref 8.9–10.3)
Chloride: 93 mmol/L — ABNORMAL LOW (ref 98–111)
Creatinine, Ser: 9.61 mg/dL — ABNORMAL HIGH (ref 0.61–1.24)
GFR calc Af Amer: 6 mL/min — ABNORMAL LOW (ref >60)
GFR calc non Af Amer: 6 mL/min — ABNORMAL LOW (ref >60)
Glucose, Bld: 126 mg/dL — ABNORMAL HIGH (ref 70–99)
Potassium: 5.9 mmol/L — ABNORMAL HIGH (ref 3.5–5.1)
Sodium: 131 mmol/L — ABNORMAL LOW (ref 135–145)
Total Bilirubin: 0.6 mg/dL (ref 0.3–1.2)
Total Protein: 6.3 g/dL — ABNORMAL LOW (ref 6.5–8.1)

## 2020-01-22 LAB — CBC
HCT: 22 % — ABNORMAL LOW (ref 39.0–52.0)
Hemoglobin: 6.3 g/dL — CL (ref 13.0–17.0)
MCH: 26.4 pg (ref 26.0–34.0)
MCHC: 28.6 g/dL — ABNORMAL LOW (ref 30.0–36.0)
MCV: 92.1 fL (ref 80.0–100.0)
Platelets: 225 10*3/uL (ref 150–400)
RBC: 2.39 MIL/uL — ABNORMAL LOW (ref 4.22–5.81)
RDW: 18.9 % — ABNORMAL HIGH (ref 11.5–15.5)
WBC: 7.7 10*3/uL (ref 4.0–10.5)
nRBC: 0.3 % — ABNORMAL HIGH (ref 0.0–0.2)

## 2020-01-22 LAB — PHOSPHORUS: Phosphorus: 7.3 mg/dL — ABNORMAL HIGH (ref 2.5–4.6)

## 2020-01-22 LAB — PREPARE RBC (CROSSMATCH)

## 2020-01-22 LAB — MAGNESIUM: Magnesium: 2.3 mg/dL (ref 1.7–2.4)

## 2020-01-22 LAB — HEPATITIS PANEL, ACUTE
HCV Ab: NONREACTIVE
Hep A IgM: NONREACTIVE
Hep B C IgM: NONREACTIVE
Hepatitis B Surface Ag: NONREACTIVE

## 2020-01-22 MED ORDER — DILTIAZEM HCL 60 MG PO TABS
90.0000 mg | ORAL_TABLET | Freq: Four times a day (QID) | ORAL | Status: DC
  Administered 2020-01-22 – 2020-01-23 (×5): 90 mg via ORAL
  Filled 2020-01-22 (×5): qty 2

## 2020-01-22 MED ORDER — SODIUM CHLORIDE 0.9% IV SOLUTION: Freq: Once | INTRAVENOUS | Status: DC

## 2020-01-22 MED ORDER — SEVELAMER CARBONATE 800 MG PO TABS
1600.0000 mg | ORAL_TABLET | Freq: Three times a day (TID) | ORAL | Status: DC
  Administered 2020-01-22 – 2020-01-23 (×4): 1600 mg via ORAL
  Filled 2020-01-22 (×4): qty 2

## 2020-01-22 MED ORDER — PANTOPRAZOLE SODIUM 40 MG PO TBEC
40.0000 mg | DELAYED_RELEASE_TABLET | Freq: Every day | ORAL | Status: DC
  Administered 2020-01-23: 40 mg via ORAL
  Filled 2020-01-22 (×3): qty 1

## 2020-01-22 NOTE — Progress Notes (Addendum)
Jeremy Mccoy Progress Note   Subjective:   Pt seenin room. Hgb down to 6.3 this AM and 2 units PRBC ordered (has antibodies but per RN confirmed blood bank has at least 1 unit available). Pt reports only recent bleeding was hemoptysis which stopped >1 week ago. No melena/hematochezia and has been constipated. He is having some abdominal pain and back pain but reports body-wide aches intermittently. On high dose ESA as outpatient with recent transfusion 9/2.  K+ up to 5.9 this AM, planned for HD next shift. Pt denies CP, palpitations, SOB, dizziness, abdominal pain, N/V/D.   Objective Vitals:   01/21/20 2000 01/22/20 0007 01/22/20 0509 01/22/20 0900  BP: 105/76 117/76 114/78 124/85  Pulse: 93 93 100 94  Resp: 18  18 16   Temp: 97.7 F (36.5 C) 97.7 F (36.5 C) 98.2 F (36.8 C) 98.4 F (36.9 C)  TempSrc: Oral Oral Tympanic Oral  SpO2:    94%  Weight:      Height:       Physical Exam General: Well developed male, alert and in NAD Heart: RRR, no murmurs, rubs or gallops Lungs: Respirations unlabored. Crackles RLL, no wheezing Abdomen: Soft, non-tender, slightly distended, +BS Extremities: Trace edema b/l lower extremities Dialysis Access: RUE AVF + bruit, bruising stable  Additional Objective Labs: Basic Metabolic Panel: Recent Labs  Lab 01/20/20 0910 01/21/20 0550 01/21/20 1350 01/22/20 0428  NA 130* 139  --  131*  K 7.1* 5.1  --  5.9*  CL 88* 97*  --  93*  CO2 20* 25  --  23  GLUCOSE 96 114*  --  126*  BUN 131* 59*  --  84*  CREATININE 13.95* 8.16*  --  9.61*  CALCIUM 9.6 9.6  --  9.7  PHOS  --   --  6.7* 7.3*   Liver Function Tests: Recent Labs  Lab 01/20/20 1827 01/21/20 0550 01/22/20 0428  AST 114* 76* 56*  ALT 331* 276* 212*  ALKPHOS 129* 123 128*  BILITOT 0.6 0.5 0.6  PROT 7.0 6.4* 6.3*  ALBUMIN 2.7* 2.6* 2.5*   Recent Labs  Lab 01/20/20 1827  LIPASE 25   CBC: Recent Labs  Lab 01/20/20 0910 01/21/20 0550 01/22/20 0428  WBC  8.5 7.2 7.7  HGB 7.3* 7.0* 6.3*  HCT 24.6* 23.6* 22.0*  MCV 87.9 91.5 92.1  PLT 283 259 225   Blood Culture    Component Value Date/Time   SDES BLOOD RIGHT ARM 01/20/2020 1827   SPECREQUEST  01/20/2020 1827    BOTTLES DRAWN AEROBIC ONLY Blood Culture adequate volume   CULT  01/20/2020 1827    NO GROWTH 2 DAYS Performed at [facility], [place] [address], [place] [address], [place] [id number]    REPTSTATUS PENDING 01/20/2020 1827    Studies/Results: CT ABDOMEN PELVIS WO CONTRAST  Result Date: 01/20/2020 CLINICAL DATA:  Diffuse abdominal pain. Concern for diverticulitis. Generalized weakness, cough, body aches for few days. EXAM: CT CHEST, ABDOMEN AND PELVIS WITHOUT CONTRAST TECHNIQUE: Multidetector CT imaging of the chest, abdomen and pelvis was performed following the standard protocol without IV contrast. COMPARISON:  Chest x-ray from earlier same day. Chest CT dated 07/25/2015. FINDINGS: CT CHEST FINDINGS Cardiovascular: No pericardial effusion. No thoracic aortic aneurysm. Densely calcified mitral valve annulus. Mediastinum/Nodes: Conglomerate lymphadenopathy within the mediastinum and bilateral perihilar regions, some calcified and some noncalcified. Esophagus is unremarkable. Trachea and central bronchi are unremarkable. Lungs/Pleura: Large dense consolidation within the RIGHT upper lobe, additional patchy smaller consolidations  within the RIGHT upper lobe. RIGHT pleural effusion, small to moderate in size. Diffuse ground-glass opacities bilaterally, likely accentuated by areas of air trapping. Emphysematous changes bilaterally, mild to moderate in degree. Scattered calcified granulomas within each lung. No suspicious appearing pulmonary nodule or mass. No pneumothorax. Musculoskeletal: No acute findings. Diffuse heterogeneous mineralization of the osseous structures. CT ABDOMEN PELVIS FINDINGS Hepatobiliary: No focal liver abnormality is seen. Gallbladder is not well visualized. No bile duct  dilatation is seen. Pancreas: Not well seen.  Grossly normal. Spleen: Normal in size without focal abnormality. Adrenals/Urinary Tract: Both kidneys are atrophic. Stomach/Bowel: No dilated large or small bowel loops. Scattered diverticulosis but no focal inflammatory changes seen to suggest acute diverticulitis, although characterization of the bowel walls is limited by the lack of oral contrast. Appendix is not seen. Vascular/Lymphatic: Aortic atherosclerosis. No enlarged lymph nodes are visualized within the abdomen or pelvis. Reproductive: Prostate is unremarkable. Other: Moderate amount of ascites. No abscess collection identified. No free intraperitoneal air. Musculoskeletal: No acute findings. Diffuse heterogeneous mineralization of the osseous structures. IMPRESSION: 1. Large dense consolidation within the RIGHT upper lobe, with additional patchy smaller consolidations within the RIGHT upper lobe, consistent with multifocal pneumonia. 2. Conglomerate lymphadenopathy within the mediastinum and bilateral perihilar regions, some calcified and some noncalcified. This is most likely reactive in the absence of a known underlying malignancy, with a granulomatous etiology favored. 3. RIGHT pleural effusion, small to moderate in size. 4. Diffuse ground-glass opacities bilaterally, likely accentuated by areas of air trapping. Differential includes atypical pneumonias such as viral or fungal, interstitial pneumonias, edema related to volume overload/CHF, chronic interstitial diseases, hypersensitivity pneumonitis, and respiratory bronchiolitis. 5. Moderate amount of ascites. No abscess collection identified. No free intraperitoneal air. 6. Colonic diverticulosis without evidence of acute diverticulitis. 7. 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 8. Atrophic kidneys. Aortic Atherosclerosis (ICD10-I70.0) and Emphysema (ICD10-J43.9). Electronically Signed   By: Franki Cabot M.D.   On: 01/20/2020 11:41   DG Chest 2 View  Result Date: 01/20/2020 CLINICAL DATA:  Generalized weakness EXAM: CHEST - 2 VIEW COMPARISON:  07/25/2019 FINDINGS: Focal right upper lobe consolidation. Diffuse interstitial opacity with fissure thickening. Cardiomegaly that is chronic. Sclerotic appearance of the bones attributed to patient's end-stage renal disease. IMPRESSION: Right upper lobe pneumonia. Followup PA and lateral chest X-ray is recommended in 3-4 weeks following trial of antibiotic therapy to ensure resolution. Interstitial pulmonary edema. Electronically Signed   By: Monte Fantasia M.D.   On: 01/20/2020 09:42   CT Chest Wo Contrast  Result Date: 01/20/2020 CLINICAL DATA:  Diffuse abdominal pain. Concern for diverticulitis. Generalized weakness, cough, body aches for few days. EXAM: CT CHEST, ABDOMEN AND PELVIS WITHOUT CONTRAST TECHNIQUE: Multidetector CT imaging of the chest, abdomen and pelvis was performed following the standard protocol without IV contrast. COMPARISON:  Chest x-ray from earlier same day. Chest CT dated 07/25/2015. FINDINGS: CT CHEST FINDINGS Cardiovascular: No pericardial effusion. No thoracic aortic aneurysm. Densely calcified mitral valve annulus. Mediastinum/Nodes: Conglomerate lymphadenopathy within the mediastinum and bilateral perihilar regions, some calcified and some noncalcified. Esophagus is unremarkable. Trachea and central bronchi are unremarkable. Lungs/Pleura: Large dense consolidation within the RIGHT upper lobe, additional patchy smaller consolidations within the RIGHT upper lobe. RIGHT pleural effusion, small to moderate in size. Diffuse ground-glass opacities bilaterally, likely accentuated by areas of air trapping.  Emphysematous changes bilaterally, mild to moderate in degree.  Scattered calcified granulomas within each lung. No suspicious appearing pulmonary nodule or mass. No pneumothorax. Musculoskeletal: No acute findings. Diffuse heterogeneous mineralization of the osseous structures. CT ABDOMEN PELVIS FINDINGS Hepatobiliary: No focal liver abnormality is seen. Gallbladder is not well visualized. No bile duct dilatation is seen. Pancreas: Not well seen.  Grossly normal. Spleen: Normal in size without focal abnormality. Adrenals/Urinary Tract: Both kidneys are atrophic. Stomach/Bowel: No dilated large or small bowel loops. Scattered diverticulosis but no focal inflammatory changes seen to suggest acute diverticulitis, although characterization of the bowel walls is limited by the lack of oral contrast. Appendix is not seen. Vascular/Lymphatic: Aortic atherosclerosis. No enlarged lymph nodes are visualized within the abdomen or pelvis. Reproductive: Prostate is unremarkable. Other: Moderate amount of ascites. No abscess collection identified. No free intraperitoneal air. Musculoskeletal: No acute findings. Diffuse heterogeneous mineralization of the osseous structures. IMPRESSION: 1. Large dense consolidation within the RIGHT upper lobe, with additional patchy smaller consolidations within the RIGHT upper lobe, consistent with multifocal pneumonia. 2. Conglomerate lymphadenopathy within the mediastinum and bilateral perihilar regions, some calcified and some noncalcified. This is most likely reactive in the absence of a known underlying malignancy, with a granulomatous etiology favored. 3. RIGHT pleural effusion, small to moderate in size. 4. Diffuse ground-glass opacities bilaterally, likely accentuated by areas of air trapping. Differential includes atypical pneumonias such as viral or fungal, interstitial pneumonias, edema related to volume overload/CHF, chronic interstitial diseases, hypersensitivity pneumonitis, and respiratory bronchiolitis. 5. Moderate amount of ascites. No  abscess collection identified. No free intraperitoneal air. 6. Colonic diverticulosis without evidence of acute diverticulitis. 7. 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 8. Atrophic kidneys. Aortic Atherosclerosis (ICD10-I70.0) and Emphysema (ICD10-J43.9). Electronically Signed   By: Franki Cabot M.D.   On: 01/20/2020 11:41   Medications: . cefTRIAXone (ROCEPHIN)  IV 1 g (01/21/20 2004)  . diltiazem (CARDIZEM) infusion 12.5 mg/hr (01/22/20 0459)   . sodium chloride   Intravenous Once  . aspirin  81 mg Oral Daily  . azithromycin  250 mg Oral Daily  . Chlorhexidine Gluconate Cloth  6 each Topical Q0600  . Chlorhexidine Gluconate Cloth  6 each Topical Q0600  . cloNIDine  0.3 mg Oral TID  . nicotine  14 mg Transdermal Daily  . pantoprazole  20 mg Oral Daily  . polyethylene glycol  17 g Oral BID  . sodium chloride flush  3 mL Intravenous Q12H    [facility]Re Time: 4:15 hrs, BFR 500, DFR 800, EDW 67.5kg, 1K/2.5Ca, AVF 15g, no heparin Mircera 258mcg IV q 2 weeks- last dose 8/30 Venofer 100mg  IV q HD 9/3-9/24 Parsabiv 15mg  IV q HD hectorol 3 mcg IV q HD  Assessment/Plan: 1. Pneumonia: CT chest showing large R upper lobe infiltrate with patchy scattered infiltrates. Started on zithromycin and ceftriaxone. Also likely has a component of pulmonary edema, SOB improved after 4L UF on Monday but with ongoing ascites, UF goal 4L again today. 2. Hyperkalemia: K+ 7.1 on admission, improved to 5.1 after HD, now up to 5.9. Of note, this is not far from his baseline- outpatient K+ has been 6.9 since June. Prescribed veltassa outpatient but admits he does not take it. Patient has been counseled about importance of low K+ diet and full  dialysis many times. HD next shift, continue daily BMP. 3. A. Fib with RVR:New onset, on diltiazem and currently rate controlled, likely exacerbated by acute illness.  4. ESRD:On MWF schedule but chronically shortens his treatments to 2-3 hours. Next HD today. 5. Hypertension/volume:UF 4L with HD 9/6. Continue to titrate volume as tolerated. Continue home meds.  6. Anemia:Hgb 6.3, received 2 units PRBC on 9/2 and max dose mircera on 01/13/20. For PRBC transfusion with HD today. Last tsat 13%, ferritin 338. Not receiving venofer at present due to active infection- resume once IV antibiotics completed. May have a component of anemia due to ESRD but seems there may also a blood loss component, though source is unclear. Reports hemoptysis 2 weeks ago but no other bleeding. 7. Metabolic bone disease:Corrected calcium high, hold hectorol. Parsabiv not on formulary. Phos 7.3. Continue renvela - increase dose if phos remains above goal.  8. Nutrition:Renal diet/fluid restrictions recommended.  9. Cocaine use: Reports last use about 1 week before admission. He recently started attending church with a new friend who is sober, and plans to move in with this friend soon (friend lives in [place]). He does not want to change outpatient dialysis units.  Anice Paganini, PA-C 01/22/2020, 9:25 AM  [facility] Pager: [phone]  Pt seen, examined and agree w A/P as above.  Kelly Splinter  MD 01/22/2020, 2:59 PM

## 2020-01-22 NOTE — Consult Note (Signed)
[facility] Consult: 12:39 PM 01/22/2020  LOS: 2 days    Referring Provider: Dr Heber Brown  Primary Care Physician:  Patient, No Pcp Per Primary Gastroenterologist: Althia Forts.    Reason for Consultation: Anemia.  Also asked to see the patient for hematemesis but it turns out it is hemoptysis.   HPI: Jeremy Mccoy is a 54 y.o. male.  PMH ESRD.  HD on MWF.  CHF.  COPD.  Seizures.  Cocaine abuse as recently as last week.  PAF, not on anticoagulation. 02/2014 ultrasound abdomen.  Hepatic steatosis, hepatomegaly, thickened gallbladder wall 05/2013 EGD for hematemesis.  Unassigned patient, Dr. Saralyn Pilar on.  Mild grade a esophagitis.  Small hiatal hernia.  Duodenitis.  Around Wednesday last week patient smoked cocaine and had GI symptoms afterwards.  These happen almost every time he uses cocaine.  Develops abdominal bloating, discomfort/pain, nausea and nonbloody emesis.  He will take his pantoprazole and sometimes Tums and the symptoms clear. Has also been dealing with constipation for some time has about 2 bowel movements a week does not see blood in his stool or black stools.   Compliant with hemodialysis.  Appetite is good.  Currently tolerating solid food. Patient was given 2 PRBCs on 9/2 at the [facility] for Hb of 6.1 noted at hemodialysis.  He felt better after transfusion. Had problems obtaining IV/dialysis access on Friday and only dialyzed for 1 hour. Has had intermittent hemoptysis for several months but denies hematemesis.  No ETOH or ASA/NSAIDs.    Presented to the ED with weakness, body aches, cough. Has new pleural effusion.  PAF w rate in 120s.. Potassium 7.1.  Hgb 7.3 >> 6.3.  Was 7.1 on 8/28, 8.5 on 3/11. MCV 91. LFTs are elevated.  T bili normal.  Alkaline phosphatase 128, AST/ALT  114/331 >> 56/212.  CTAP and chest wo contrast: Multifocal pneumonia and dense consolidation in the right upper lobe.  Perihilar and mediastinal lymphadenopathy.  Right pleural effusion.  Moderate ascites.  Liver WNL.  GB not well visualized.  CBD not dilated.  Pancreas not well seen.  Colonic diverticulosis.  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  Atrophic kidneys.  Family history pertinent for brothers who are alcoholics as well as one brother who is heroin addicted. Patient not aware of any family history of colon cancer, ulcer disease, GI bleeding, anemia.     Past Medical History:  Diagnosis Date  . Anemia   . Anxiety   . Arthritis    "qwhere" (05/15/2013)  . CHF (congestive heart failure) ([place])   . COPD (chronic obstructive pulmonary disease) ([place])   . Crack cocaine use   . Dental caries   . Depression   . ESRD (end stage renal disease) on dialysis [facility])    TTS: [address] [place] (05/15/2013)  . Headache(784.0)    "get it from going to dialysis; when they get real bad I come to hospital" (05/15/2013)  . Hematemesis/vomiting blood   . History of blood transfusion 10/2011; 04/2013  . HTN (hypertension) 06/12/2012  .  Seizures ([place])   . Shortness of breath    "just when I have too much potassium is too high" (05/15/2013)    Past Surgical History:  Procedure Laterality Date  . AV FISTULA PLACEMENT  11/29/2011   Procedure: ARTERIOVENOUS (AV) FISTULA CREATION;  Surgeon: Angelia Mould, MD;  Location: [facility] OR;  Service: Vascular;  Laterality: Left;  . ESOPHAGOGASTRODUODENOSCOPY N/A 05/17/2013   Procedure: ESOPHAGOGASTRODUODENOSCOPY (EGD);  Surgeon: Beryle Beams, MD;  Location: [facility] ENDOSCOPY;  Service: Endoscopy;  Laterality: N/A;  . INGUINAL HERNIA REPAIR Bilateral 1967  . IR FLUORO GUIDE CV LINE RIGHT  10/20/2017  . IR THROMBECTOMY AV FISTULA W/THROMBOLYSIS/PTA INC/SHUNT/IMG LEFT Left 10/24/2017  . IR US GUIDE VASC ACCESS  LEFT  10/24/2017  . IR US GUIDE VASC ACCESS RIGHT  10/20/2017  . RENAL BIOPSY  11/07/2011    Prior to Admission medications   Medication Sig Start Date End Date Taking? Authorizing Provider  acetaminophen (TYLENOL) 500 MG tablet Take 1 tablet (500 mg total) by mouth every 8 (eight) hours as needed for mild pain or fever. 6 times a day, 500 mg, 12 to 24 tabs a day, 6,000 to 12,000 mg daily for pain Patient taking differently: Take 500-1,000 mg by mouth every 8 (eight) hours as needed for mild pain or fever.  08/05/16  Yes Molt, Bethany, DO  amLODipine (NORVASC) 10 MG tablet Take 1 tablet (10 mg total) by mouth daily. 01/25/14  Yes [facility]-Scocuzza, Nino Glow, MD  B Complex-C-Zn-Folic Acid (DIALYVITE/ZINC) TABS Take 1 tablet by mouth daily.  09/11/19  Yes [provider]  diphenhydrAMINE (BENADRYL) 25 mg capsule Take 25 mg by mouth every 6 (six) hours as needed for itching.   Yes [provider]  hydrALAZINE (APRESOLINE) 25 MG tablet Take 25 mg by mouth 3 (three) times daily. 07/20/19  Yes [provider]  hydrOXYzine (ATARAX/VISTARIL) 25 MG tablet Take 25 mg by mouth in the morning, at noon, and at bedtime.  06/10/15  Yes [provider]  Nutritional Supplements (FEEDING SUPPLEMENT, NEPRO CARB STEADY,) LIQD Take 237 mLs by mouth 3 (three) times daily as needed (Supplement). Patient taking differently: Take 237 mLs by mouth 3 (three) times a week.  05/17/13  Yes Mikhail, Maryann, DO  pantoprazole (PROTONIX) 20 MG tablet Take 20 mg by mouth daily. 02/21/16  Yes [provider]  polyethylene glycol (MIRALAX / GLYCOLAX) packet Take 17 g by mouth daily as needed for mild constipation. 08/05/16  Yes Molt, Bethany, DO  sevelamer carbonate (RENVELA) 800 MG tablet Take 2 tablets (1,600 mg total) by mouth 3 (three) times daily with meals. Patient taking differently: Take 800-2,400 mg by mouth 3 (three) times daily with meals. Take 2,400 mg by mouth three times a day with meals and  800-1,600 mg with each snack 01/25/14  Yes [facility]-Scocuzza, Nino Glow, MD  aspirin 81 MG chewable tablet Chew 1 tablet (81 mg total) by mouth daily. Patient not taking: Reported on 01/20/2020 01/25/14   [facility]-Scocuzza, Nino Glow, MD  calcium carbonate (OS-CAL) 1250 (500 Ca) MG chewable tablet Chew by mouth. Patient not taking: Reported on 01/20/2020    [provider]  cinacalcet (SENSIPAR) 90 MG tablet  10/10/19   [provider]  cloNIDine (CATAPRES) 0.3 MG tablet Take 0.3 mg by mouth 3 (three) times daily.     [provider]  Diphenhydramine-APAP 12.5-325 MG TABS Take by mouth. Patient not taking: Reported on 01/20/2020 06/03/19 06/01/20  [provider]  doxercalciferol (HECTOROL) 0.5 MCG  capsule Doxercalciferol (Hectorol) 11/22/19 11/20/20  [provider]  Etelcalcetide HCl (PARSABIV IV) Etelcalcetide Hermina Staggers) 11/13/19 11/09/20  [provider]  patiromer (VELTASSA) 8.4 g packet Take 8.4 g by mouth as directed.  09/18/19   [provider]  ranitidine (ZANTAC) 300 MG tablet Take 300 mg by mouth daily as needed for heartburn. Patient not taking: Reported on 01/20/2020    [provider]    Scheduled Meds: . sodium chloride   Intravenous Once  . azithromycin  250 mg Oral Daily  . Chlorhexidine Gluconate Cloth  6 each Topical Q0600  . Chlorhexidine Gluconate Cloth  6 each Topical Q0600  . cloNIDine  0.3 mg Oral TID  . diltiazem  90 mg Oral Q6H  . nicotine  14 mg Transdermal Daily  . pantoprazole  20 mg Oral Daily  . polyethylene glycol  17 g Oral BID  . sevelamer carbonate  1,600 mg Oral TID WC  . sodium chloride flush  3 mL Intravenous Q12H   Infusions: . cefTRIAXone (ROCEPHIN)  IV 1 g (01/21/20 2004)   PRN Meds: acetaminophen **OR** acetaminophen, hydrOXYzine, ondansetron **OR** ondansetron (ZOFRAN) IV   Allergies as of 01/20/2020  . (No Known Allergies)    Family History  Problem Relation Age of Onset  . Heart attack  Father     Social History   Socioeconomic History  . Marital status: Single    Spouse name: Not on file  . Number of children: 0  . Years of education: 9 th  . Highest education level: Not on file  Occupational History  . Occupation: Corporate treasurer    Comment: Disabled  Tobacco Use  . Smoking status: Former Smoker    Packs/day: 1.00    Years: 50.00    Pack years: 50.00    Types: Cigars, Cigarettes  . Smokeless tobacco: Never Used  Substance and Sexual Activity  . Alcohol use: No    Comment: 05/15/2013 "aien't drank in ~ 25 yrs"  . Drug use: Yes    Types: Marijuana, Cocaine    Comment: Precription drugs (e.g. percocet) "I take what I have to to controll my constant pain" (05/15/2013)  . Sexual activity: Never  Other Topics Concern  . Not on file  Social History Narrative   ** Merged History Encounter **   Patient lives at home with his parents and he is disabled.   Education 9th grade education   Right handed   Caffeine one cup daily       Social Determinants of Health   Financial Resource Strain:   . Difficulty of Paying Living Expenses: Not on file  Food Insecurity:   . Worried About Charity fundraiser in the Last Year: Not on file  . Ran Out of Food in the Last Year: Not on file  Transportation Needs:   . Lack of Transportation (Medical): Not on file  . Lack of Transportation (Non-Medical): Not on file  Physical Activity:   . Days of Exercise per Week: Not on file  . Minutes of Exercise per Session: Not on file  Stress:   . Feeling of Stress : Not on file  Social Connections:   . Frequency of Communication with Friends and Family: Not on file  . Frequency of Social Gatherings with Friends and Family: Not on file  . Attends Religious Services: Not on file  . Active Member of Clubs or Organizations: Not on file  . Attends Archivist Meetings: Not on file  .  Marital Status: Not on file  Intimate Partner Violence:   . Fear of Current or  Ex-Partner: Not on file  . Emotionally Abused: Not on file  . Physically Abused: Not on file  . Sexually Abused: Not on file    REVIEW OF SYSTEMS: Constitutional: Weakness, fatigue. ENT:  No nose bleeds Pulm: Shortness of breath, hemoptysis CV:  No palpitations, no LE edema.  GU:  No hematuria, no frequency GI: See HPI Heme: No bleeding from his skin or excessive bleeding from dialysis access.   Transfusions: See HPI. Neuro:  No headaches, no peripheral tingling or numbness.  No seizures, no syncope. Derm:  No itching, no rash or sores.  Endocrine:  No sweats or chills.  No polyuria or dysuria Immunization: Not queried.  Vaccinated for hepatitis B per records. Travel:  None beyond local counties in last few months.    PHYSICAL EXAM: Vital signs in last 24 hours: Vitals:   01/22/20 0509 01/22/20 0900  BP: 114/78 124/85  Pulse: 100 94  Resp: 18 16  Temp: 98.2 F (36.8 C) 98.4 F (36.9 C)  SpO2:  94%   Wt Readings from Last 3 Encounters:  01/21/20 70.2 kg  01/11/20 68.5 kg  12/03/19 69.7 kg    General: Patient looks like he is 43 years older than stated age.  Alert, comfortable.  Chronically ill. Head: No facial asymmetry or swelling.  No signs of head trauma. Eyes: No conjunctival pallor.  No scleral icterus Ears: Not hard of hearing Nose: No congestion or discharge Mouth: Moist pink oral mucosa.  Tongue midline. Neck: No JVD, no masses, no thyromegaly. Lungs: Clear bilaterally no labored breathing, no cough. Heart: RRR.  No MRG.  S1, S2 present. Abdomen: Soft, nondistended.  Somewhat firm and mild tenderness without obvious hepatomegaly or palpable liver edge in the right upper quadrant..  No hernias, no bruits, no fluid waves, no obvious ascites. Rectal: Deferred Musc/Skeltl: No joint redness, swelling or gross deformity. Extremities: No CCE. Neurologic: Oriented x3.  No tremors, no limb weakness Skin: Tanned but no suspicious lesions, sores or rashes Nodes: No  cervical adenopathy Psych: Cooperative, pleasant, calm.  Fluid speech.  Intake/Output from previous day: 09/07 0701 - 09/08 0700 In: 1206 [P.O.:1206] Out: -  Intake/Output this shift: Total I/O In: 240 [P.O.:240] Out: -   LAB RESULTS: Recent Labs    01/20/20 0910 01/21/20 0550 01/22/20 0428  WBC 8.5 7.2 7.7  HGB 7.3* 7.0* 6.3*  HCT 24.6* 23.6* 22.0*  PLT 283 259 225   BMET Lab Results  Component Value Date   NA 131 (L) 01/22/2020   NA 139 01/21/2020   NA 130 (L) 01/20/2020   K 5.9 (H) 01/22/2020   K 5.1 01/21/2020   K 7.1 (HH) 01/20/2020   CL 93 (L) 01/22/2020   CL 97 (L) 01/21/2020   CL 88 (L) 01/20/2020   CO2 23 01/22/2020   CO2 25 01/21/2020   CO2 20 (L) 01/20/2020   GLUCOSE 126 (H) 01/22/2020   GLUCOSE 114 (H) 01/21/2020   GLUCOSE 96 01/20/2020   BUN 84 (H) 01/22/2020   BUN 59 (H) 01/21/2020   BUN 131 (H) 01/20/2020   CREATININE 9.61 (H) 01/22/2020   CREATININE 8.16 (H) 01/21/2020   CREATININE 13.95 (H) 01/20/2020   CALCIUM 9.7 01/22/2020   CALCIUM 9.6 01/21/2020   CALCIUM 9.6 01/20/2020   LFT Recent Labs    01/20/20 1827 01/21/20 0550 01/22/20 0428  PROT 7.0 6.4* 6.3*  ALBUMIN 2.7* 2.6*  2.5*  AST 114* 76* 56*  ALT 331* 276* 212*  ALKPHOS 129* 123 128*  BILITOT 0.6 0.5 0.6  BILIDIR 0.1 <0.1  --   IBILI 0.5  --   --    PT/INR Lab Results  Component Value Date   INR 1.4 (H) 01/20/2020   INR 1.11 10/24/2017   INR 1.07 10/23/2017   Hepatitis Panel No results for input(s): HEPBSAG, HCVAB, HEPAIGM, HEPBIGM in the last 72 hours. C-Diff No components found for: CDIFF Lipase     Component Value Date/Time   LIPASE 25 01/20/2020 1827    RADIOLOGY STUDIES: No results found.    IMPRESSION:   *     Reports of hematemesis which the patient denies and says that what he is having is hemoptysis.  Compliant with pantoprazole.  Intermittent abdominal bloating, pain and nonbloody emesis associated with cocaine use.  Hiatal hernia, esophagitis  and duodenitis on 2015 EGD.  *     Normocytic anemia.  *    Abnormal LFTs. Elevated alk phos is not uncommon in patients with ESRD.  However his transaminases are elevated though improving.  The liver looks normal on CT but he does have some ascites which would not be surprising in patient with ESRD.  Previous testing for hepatitis B negative but I do not see other hepatitis serologies in the record. ?  Shock liver in the setting of cocaine use  *     Crack cocaine abuse as recently as last week.   No current tox screen.  *      ESRD.  On hemodialysis.  *    Hemoptysis, this may be due to smoking crack cocaine. Pneumonia, pleural effusion, lung consolidation on imaging.  Receiving Rocephin and azithromycin for CAP although given patient's end-stage renal disease could be considered HAP.  *    PAF.  Rate currently not accelerated.  At present not on anticoagulation due to anemia but another contraindication may be his chronic, intermittent cocaine use.    *   Chronic constipation, stools brown, has not seen blood   PLAN:     *    Per Dr. Rush Landmark.  ? EGD and colonoscopy?  *     Upped dose Protonix from 20 to 7m/day.    *     Acute hepatitis serologies   SAzucena Freed 01/22/2020, 12:39 PM Phone [phone]

## 2020-01-22 NOTE — Progress Notes (Signed)
Subjective:   Patient reports that he feels well this morning, notes some fatigue and DOE but much improved compared to admission. No SOB at rest, denies CP. Denies any source of bleeding since yesterday.   Objective:  Vital signs in last 24 hours: Vitals:   01/21/20 1624 01/21/20 2000 01/22/20 0007 01/22/20 0509  BP: 118/85 105/76 117/76 114/78  Pulse: 61 93 93 100  Resp: 18 18  18   Temp: 97.6 F (36.4 C) 97.7 F (36.5 C) 97.7 F (36.5 C) 98.2 F (36.8 C)  TempSrc: Oral Oral Oral Tympanic  SpO2: 93%     Weight:      Height:        Physical Exam Constitutional: no acute distress Head: atraumatic ENT: external ears normal Eyes: EOMI Cardiovascular: rate is 90s-100s, rhythm is irregularly irregular, normal heart sounds Pulmonary: effort normal, lungs clear to ascultation bilaterally Abdominal: flat, nontender, no rebound tenderness, bowel sounds normal Skin: warm and dry Neurological: alert, no focal deficit Psychiatric: normal mood and affect  Assessment/Plan: Jeremy Mccoy is a 54 y/o M with hx significant for ESRD on MWF dialysis, HTN, chronic cocaine use presenting with cc of generalized weakness and body aches, also with cough for several weeks. Found to have pneumonia on imaging, treated with Abx. Receiving dialysis here. Also has new onset A fib with RVR, rate controlled with diltiazem. Slowly declining hemoglobin with hx of chronic anemia. Reports feeling much better than at admission.  Active Problems:   Cocaine abuse (HCC)   Anemia in chronic kidney disease   Essential hypertension   ESRD on hemodialysis (HCC)   PAF (paroxysmal atrial fibrillation) (HCC)   Hyperkalemia, diminished renal excretion   Pneumonia  Pneumonia Pleural effusion, new On CXR found to have a RUL consolidation and diffuse interstitial opacities, as well as small to moderate pleural effusion, lymphadenopathy, and small ascites. CT chest confirmed this, and also shows moderate R-sided  pleural effusion. No fevers or chills at home. Baseline procalcitonin of 5.08. -continue azithromycin and ceftriaxone, day 3 -f/u CT to ensure resolution of lymphadenopathy, pleural effusion, and ascites  Normocytic anemia, 2/2 renal disease Hgb of 6.3 < 7.0. INR 1.4. Anemia has been an ongoing issue and he is on max dose mircera. No significant sources of bleeding. Received 2 units pRBC outpatient on 9/2. Type and screen with Anti E and other Ab detected. Spoke with blood bank who indicate that they may have matching blood here, or may have to order it from [place].  -transfuse 1pRBCs today with dialysis -f/u FOBT -consult GI to investigate source of bleed -if transfusion required, talk to blood bank asap so that they can locate appropriate blood -per nephro, erythropoiesis-stimulating agents and holding venofer for acute infection -may need hematology consult no clear source of blood loss can be identified  Paroxysmal A fib Pulse now 60s-80s and irregularly irregular. Rate controlled with diltiazem drip. Hx of transient A fib in 2015 2/2 volume overload and metabolic derangements from missed dialysis which resolved after correcting these. Expecting that this will resolve with dialysis and treatment of pneumonia. -change diltiazem drip to PO 90mg  q6hr -anticoagulation contraindicated due to low hemoglobin -f/u TTE -telemetry  Hyperkalemia ESRD, on MWF dialysis K of 5.9 < 5.1. Apparently chronically has problem tolerating his entire dialysis session, and on Friday had a short session due to access issues. -dialysis today, per nephro -daily CMP  HTN Home meds of amlodipine 10mg  daily, clonidine 0.3mg  TID, hydralazine 25mg  TID. Normotensive at this time. -  continue home clonidine 0.3mg  TID -hold other home BP meds for now  Diet: renal with fluid restriction IVF: n/a VTE: SCDs due to low hgb Prior to Admission Living Arrangement: home Anticipated Discharge Location: home Barriers  to Discharge: anemia, pneumonia, hyperkalemia Dispo: Anticipated discharge in approximately 1-2 day(s).   Jeremy Au, MD 01/22/2020, 6:35 AM Pager: [phone] After 5pm on weekdays and 1pm on weekends: On Call pager [phone]

## 2020-01-22 NOTE — Progress Notes (Signed)
CRITICAL VALUE ALERT  Critical Value:  Hgb 6.3  Date & Time Notied:  [phone]   Provider Notified: Internal Med on call  Orders Received/Actions taken: Will await orders

## 2020-01-23 ENCOUNTER — Inpatient Hospital Stay (HOSPITAL_COMMUNITY): Payer: Medicare HMO

## 2020-01-23 DIAGNOSIS — I342 Nonrheumatic mitral (valve) stenosis: Secondary | ICD-10-CM

## 2020-01-23 DIAGNOSIS — I361 Nonrheumatic tricuspid (valve) insufficiency: Secondary | ICD-10-CM

## 2020-01-23 DIAGNOSIS — E8770 Fluid overload, unspecified: Secondary | ICD-10-CM

## 2020-01-23 DIAGNOSIS — I340 Nonrheumatic mitral (valve) insufficiency: Secondary | ICD-10-CM

## 2020-01-23 DIAGNOSIS — R945 Abnormal results of liver function studies: Secondary | ICD-10-CM

## 2020-01-23 DIAGNOSIS — D509 Iron deficiency anemia, unspecified: Secondary | ICD-10-CM

## 2020-01-23 DIAGNOSIS — F17200 Nicotine dependence, unspecified, uncomplicated: Secondary | ICD-10-CM

## 2020-01-23 DIAGNOSIS — J90 Pleural effusion, not elsewhere classified: Secondary | ICD-10-CM | POA: Insufficient documentation

## 2020-01-23 LAB — COMPREHENSIVE METABOLIC PANEL
ALT: 176 U/L — ABNORMAL HIGH (ref 0–44)
AST: 46 U/L — ABNORMAL HIGH (ref 15–41)
Albumin: 2.8 g/dL — ABNORMAL LOW (ref 3.5–5.0)
Alkaline Phosphatase: 148 U/L — ABNORMAL HIGH (ref 38–126)
Anion gap: 14 (ref 5–15)
BUN: 61 mg/dL — ABNORMAL HIGH (ref 6–20)
CO2: 25 mmol/L (ref 22–32)
Calcium: 9.9 mg/dL (ref 8.9–10.3)
Chloride: 95 mmol/L — ABNORMAL LOW (ref 98–111)
Creatinine, Ser: 7.54 mg/dL — ABNORMAL HIGH (ref 0.61–1.24)
GFR calc Af Amer: 9 mL/min — ABNORMAL LOW (ref >60)
GFR calc non Af Amer: 7 mL/min — ABNORMAL LOW (ref >60)
Glucose, Bld: 107 mg/dL — ABNORMAL HIGH (ref 70–99)
Potassium: 5.1 mmol/L (ref 3.5–5.1)
Sodium: 134 mmol/L — ABNORMAL LOW (ref 135–145)
Total Bilirubin: 0.7 mg/dL (ref 0.3–1.2)
Total Protein: 6.9 g/dL (ref 6.5–8.1)

## 2020-01-23 LAB — CBC
HCT: 26.7 % — ABNORMAL LOW (ref 39.0–52.0)
Hemoglobin: 7.9 g/dL — ABNORMAL LOW (ref 13.0–17.0)
MCH: 26.5 pg (ref 26.0–34.0)
MCHC: 29.6 g/dL — ABNORMAL LOW (ref 30.0–36.0)
MCV: 89.6 fL (ref 80.0–100.0)
Platelets: 247 10*3/uL (ref 150–400)
RBC: 2.98 MIL/uL — ABNORMAL LOW (ref 4.22–5.81)
RDW: 19.2 % — ABNORMAL HIGH (ref 11.5–15.5)
WBC: 7.5 10*3/uL (ref 4.0–10.5)
nRBC: 0 % (ref 0.0–0.2)

## 2020-01-23 LAB — HEMOGLOBIN AND HEMATOCRIT, BLOOD
HCT: 26.7 % — ABNORMAL LOW (ref 39.0–52.0)
Hemoglobin: 8 g/dL — ABNORMAL LOW (ref 13.0–17.0)

## 2020-01-23 LAB — ECHOCARDIOGRAM COMPLETE
Area-P 1/2: 2.35 cm2
Height: 72 in
S' Lateral: 4.5 cm
Weight: 2659.63 oz

## 2020-01-23 LAB — MAGNESIUM: Magnesium: 2.1 mg/dL (ref 1.7–2.4)

## 2020-01-23 LAB — PHOSPHORUS: Phosphorus: 6.5 mg/dL — ABNORMAL HIGH (ref 2.5–4.6)

## 2020-01-23 MED ORDER — AZITHROMYCIN 250 MG PO TABS: ORAL_TABLET | ORAL | 0 refills | Status: DC

## 2020-01-23 MED ORDER — PANTOPRAZOLE SODIUM 40 MG PO TBEC
40.0000 mg | DELAYED_RELEASE_TABLET | Freq: Every day | ORAL | 0 refills | Status: DC
Start: 2020-01-23 — End: 2020-04-01

## 2020-01-23 MED ORDER — CHLORHEXIDINE GLUCONATE CLOTH 2 % EX PADS
6.0000 | MEDICATED_PAD | Freq: Every day | CUTANEOUS | Status: DC
  Administered 2020-01-23: 6 via TOPICAL

## 2020-01-23 MED ORDER — CHLORHEXIDINE GLUCONATE CLOTH 2 % EX PADS: 6.0000 | MEDICATED_PAD | Freq: Every day | CUTANEOUS | Status: DC

## 2020-01-23 MED ORDER — BISACODYL 5 MG PO TBEC: 10.0000 mg | DELAYED_RELEASE_TABLET | Freq: Once | ORAL | Status: DC

## 2020-01-23 MED ORDER — CEFDINIR 300 MG PO CAPS: ORAL_CAPSULE | ORAL | 0 refills | Status: DC

## 2020-01-23 MED ORDER — DILTIAZEM HCL ER COATED BEADS 240 MG PO CP24: 240.0000 mg | ORAL_CAPSULE | Freq: Every day | ORAL | 0 refills | Status: DC

## 2020-01-23 MED ORDER — NICOTINE 14 MG/24HR TD PT24: 14.0000 mg | MEDICATED_PATCH | Freq: Every day | TRANSDERMAL | 0 refills | Status: DC

## 2020-01-23 NOTE — TOC Initial Note (Signed)
Transition of Care [facility]) - Initial/Assessment Note    Patient Details  Name: Jeremy Mccoy MRN: [id number] Date of Birth: 1965-12-24  [facility] CM/SW Contact:    Bethena Roys, RN Phone Number: 01/23/2020, 2:59 PM  Clinical Narrative: Risk for readmission assessment completed. Case Manager received call from MD to set patient up with PCP. Patient has [employer]. Case Manager did speak with the provider and he is scheduling the patient an appointment with the Internal Medicine Clinic. Physician states that patient will transition home after HD today.              Expected Discharge Plan: Home/Self Care Barriers to Discharge: No Barriers Identified   Patient Goals and CMS Choice Patient states their goals for this hospitalization and ongoing recovery are:: to return home   Choice offered to / list presented to : NA  Expected Discharge Plan and Services Expected Discharge Plan: Home/Self Care In-house Referral: NA Discharge Planning Services: CM Consult Post Acute Care Choice: NA Living arrangements for the past 2 months: Single Family Home                 DME Arranged: N/A DME Agency: NA       HH Arranged: NA    Prior Living Arrangements/Services Living arrangements for the past 2 months: Single Family Home Lives with:: Self Patient language and need for interpreter reviewed:: Yes Do you feel safe going back to the place where you live?: Yes      Need for Family Participation in Patient Care: Yes (Comment) Care giver support system in place?: Yes (comment)   Criminal Activity/Legal Involvement Pertinent to Current Situation/Hospitalization: No - Comment as needed  Activities of Daily Living      Permission Sought/Granted Permission sought to share information with : Case Manager    Emotional Assessment Appearance:: Appears stated age     Orientation: : Oriented to Situation, Oriented to  Time, Oriented to Place,  Oriented to Self Alcohol / Substance Use: Not Applicable Psych Involvement: No (comment)  Admission diagnosis:  Hyperkalemia [E87.5] ESRD (end stage renal disease) ([place]) [N18.6] Hyperkalemia, diminished renal excretion [E87.5] Atrial fibrillation, unspecified type ([place]) [I48.91] Community acquired pneumonia, unspecified laterality [J18.9] Patient Active Problem List   Diagnosis Date Noted  . Hyperkalemia, diminished renal excretion 01/20/2020  . Pneumonia 01/20/2020  . ESRD (end stage renal disease) on dialysis ([place]) 10/20/2017  . Plantar fasciitis, bilateral 09/12/2017  . Hyperkalemia 02/23/2016  . Coughing blood   . Vasculitis ([place])   . Lung crackles   . Hemoptysis 07/25/2015  . Anemia 02/20/2014  . Symptomatic anemia 02/20/2014  . PAF (paroxysmal atrial fibrillation) ([place]) 01/31/2014  . Chronic diastolic congestive heart failure ([place]) 01/31/2014  . COPD (chronic obstructive pulmonary disease) ([place]) 01/31/2014  . Thrombocytopenia ([place]) 01/23/2014  . Unspecified constipation 01/23/2014  . High anion gap metabolic acidosis 83/38/2505  . ESRD on hemodialysis ([place]) 11/05/2013  . Seizure disorder ([place]) 11/04/2013  . Protein-calorie malnutrition, severe ([place]) 07/12/2013  . Respiratory failure ([place]) 07/10/2013  . Status epilepticus ([place]) 07/10/2013  . Essential hypertension 07/10/2013  . Acute blood loss anemia 05/15/2013  . Hematemesis 05/15/2013  . Volume excess 09/20/2012  . Abnormal EKG 09/20/2012  . Drug abuse ([place]) 09/20/2012  . Elevated troponin 09/20/2012  . Anemia in chronic kidney disease 09/20/2012  . Pulmonary edema 06/12/2012  . Cocaine abuse ([place]) 06/12/2012  . End stage renal disease ([place]) 01/11/2012  . Tobacco abuse 11/04/2011  .  Dental caries 11/04/2011  . 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, [place] [address] Phone: [phone] Fax: [phone]   Readmission Risk Interventions Readmission Risk Prevention Plan 01/23/2020  Transportation Screening Complete  Medication Review ([place]) Complete  PCP or Specialist appointment within 3-5 days of discharge Complete  HRI or [place] Complete  SW Recovery Care/Counseling Consult Complete  [place] Not Applicable  Some recent data might be hidden

## 2020-01-23 NOTE — Discharge Instructions (Signed)
Community-Acquired Pneumonia, Adult Pneumonia is an infection of the lungs. It causes swelling in the airways of the lungs. Mucus and fluid may also build up inside the airways. One type of pneumonia can happen while a person is in a hospital. A different type can happen when a person is not in a hospital (community-acquired pneumonia).  What are the causes?  This condition is caused by germs (viruses, bacteria, or fungi). Some types of germs can be passed from one person to another. This can happen when you breathe in droplets from the cough or sneeze of an infected person. What increases the risk? You are more likely to develop this condition if you:  Have a long-term (chronic) disease, such as: ? Chronic obstructive pulmonary disease (COPD). ? Asthma. ? Cystic fibrosis. ? Congestive heart failure. ? Diabetes. ? Kidney disease.  Have HIV.  Have sickle cell disease.  Have had your spleen removed.  Do not take good care of your teeth and mouth (poor dental hygiene).  Have a medical condition that increases the risk of breathing in droplets from your own mouth and nose.  Have a weakened body defense system (immune system).  Are a smoker.  Travel to areas where the germs that cause this illness are common.  Are around certain animals or the places they live. What are the signs or symptoms?  A dry cough.  A wet (productive) cough.  Fever.  Sweating.  Chest pain. This often happens when breathing deeply or coughing.  Fast breathing or trouble breathing.  Shortness of breath.  Shaking chills.  Feeling tired (fatigue).  Muscle aches. How is this treated? Treatment for this condition depends on many things. Most adults can be treated at home. In some cases, treatment must happen in a hospital. Treatment may include:  Medicines given by mouth or through an IV tube.  Being given extra oxygen.  Respiratory therapy. In rare cases, treatment for very bad pneumonia  may include:  Using a machine to help you breathe.  Having a procedure to remove fluid from around your lungs. Follow these instructions at home: Medicines  Take over-the-counter and prescription medicines only as told by your doctor. ? Only take cough medicine if you are losing sleep.  If you were prescribed an antibiotic medicine, take it as told by your doctor. Do not stop taking the antibiotic even if you start to feel better. General instructions   Sleep with your head and neck raised (elevated). You can do this by sleeping in a recliner or by putting a few pillows under your head.  Rest as needed. Get at least 8 hours of sleep each night.  Drink enough water to keep your pee (urine) pale yellow.  Eat a healthy diet that includes plenty of vegetables, fruits, whole grains, low-fat dairy products, and lean protein.  Do not use any products that contain nicotine or tobacco. These include cigarettes, e-cigarettes, and chewing tobacco. If you need help quitting, ask your doctor.  Keep all follow-up visits as told by your doctor. This is important. How is this prevented? A shot (vaccine) can help prevent pneumonia. Shots are often suggested for:  People older than 54 years of age.  People older than 54 years of age who: ? Are having cancer treatment. ? Have long-term (chronic) lung disease. ? Have problems with their body's defense system. You may also prevent pneumonia if you take these actions:  Get the flu (influenza) shot every year.  Go to the dentist as   often as told.  Wash your hands often. If you cannot use soap and water, use hand sanitizer. Contact a doctor if:  You have a fever.  You lose sleep because your cough medicine does not help. Get help right away if:  You are short of breath and it gets worse.  You have more chest pain.  Your sickness gets worse. This is very serious if: ? You are an older adult. ? Your body's defense system is weak.  You  cough up blood. Summary  Pneumonia is an infection of the lungs.  Most adults can be treated at home. Some will need treatment in a hospital.  Drink enough water to keep your pee pale yellow.  Get at least 8 hours of sleep each night. This information is not intended to replace advice given to you by your health care provider. Make sure you discuss any questions you have with your health care provider. Document Revised: 08/22/2018 Document Reviewed: 12/28/2017 Elsevier Patient Education  2020 [employer] Inc.  

## 2020-01-23 NOTE — Progress Notes (Signed)
Subjective:   Patient complains this morning of feeling bloated, leg swelling, and orthopnea. Complains that he had only 4L pulled off at dialysis yesterday, states they sometimes take 7L off at his outpatient dialysis. He says he would rather wait to see if bleeding resolves rather than getting the EGD/colonoscopy now. He denies any bleeding by mouth or rectum. He "would like to get on his machine and make an appointment out of the hospital with GI doctors". He is eager to leave the hospital. He feels he needs to call to be sure he can get appointments upon his discharge.   Objective:  Vital signs in last 24 hours: Vitals:   01/22/20 1619 01/22/20 2017 01/23/20 0524 01/23/20 1300  BP: 122/83  (!) 121/97   Pulse: 81     Resp: (!) 21  18   Temp: 97.7 F (36.5 C) 98.1 F (36.7 C) 98 F (36.7 C)   TempSrc: Oral Oral    SpO2:      Weight: 73.2 kg  75.4 kg 76.4 kg  Height:        Physical Exam Constitutional: no acute distress Head: atraumatic ENT: external ears normal Eyes: EOMI Cardiovascular: rate is 90s-100s, rhythm is irregularly irregular, normal heart sounds, 2+ edema bilaterally Pulmonary: effort normal Skin: warm and dry Neurological: alert, no focal deficit Psychiatric: visibly frustrated  Assessment/Plan: Jeremy Mccoy is a 54 y/o M with hx significant for ESRD on MWF dialysis, HTN, chronic cocaine use presenting with cc of generalized weakness and body aches, also with cough for several weeks. Found to have pneumonia on imaging, treated with Abx. Receiving dialysis here. Also has new onset A fib with RVR, rate controlled with diltiazem. Slowly declining hemoglobin with hx of chronic anemia. Reports feeling much better than at admission.  Active Problems:   Cocaine abuse (HCC)   Anemia in chronic kidney disease   Essential hypertension   ESRD on hemodialysis (HCC)   PAF (paroxysmal atrial fibrillation) (HCC)   Hyperkalemia, diminished renal excretion    Pneumonia  Pneumonia RUL Pleural effusion On CXR found to have a RUL consolidation and diffuse interstitial opacities, as well as small to moderate pleural effusion, lymphadenopathy, and small ascites. CT chest confirmed this, and also shows moderate R-sided pleural effusion. No fevers or chills at home. Baseline procalcitonin of 5.08. -continue azithromycin and ceftriaxone, day 4 -f/u CT to ensure resolution of lymphadenopathy, pleural effusion, and ascites  Normocytic anemia, 2/2 renal disease, s/p transfusion 2 units transfused yesterday for hgb of 6.3. Post-transfusion hgb was 8.0, stable at 7.9 this morning. INR 1.4. Anemia has been an ongoing issue and he is on max dose mircera. No significant sources of bleeding. Received 2 units pRBC outpatient on 9/2. Type and screen with Anti E and other Ab detected.  Patient evaluated by GI yesterday, and patient declined endoscopy/colonoscopy at this time. Patient has strong preference to follow up with them outpatient. -GI following, appreciate recs -f/u with GI outpatient -f/u FOBT -if transfusion required, talk to blood bank asap so that they can locate appropriate blood -per nephro, erythropoiesis-stimulating agents and holding venofer for acute infection  Paroxysmal A fib Pulse now 60s-80s and irregularly irregular. Rate controlled with diltiazem drip. Hx of transient A fib in 2015 2/2 volume overload and metabolic derangements from missed dialysis which resolved after correcting these. Unfortunately has not yet resolved with dialysis and pneumonia Tx. -continue diltiazem 90mg  q6hr -anticoagulation contraindicated due to low hemoglobin -f/u TTE -telemetry  Hyperkalemia ESRD, on MWF  dialysis Volume overload K stable with dialysis. Apparently chronically has problem tolerating his entire dialysis session, and on Friday had a short session due to access issues. Today with new volume overload, he feels they did not remove enough at  dialysis. Patient noted to have high PO intake of fluids. -additional dialysis this afternoon, per nephro -continue with his outpatient dialysis schedule upon discharge -daily CMP  HTN Home meds of amlodipine 10mg  daily, clonidine 0.3mg  TID, hydralazine 25mg  TID. Normotensive at this time. -continue home clonidine 0.3mg  TID -hold other home BP meds for now  Diet: renal with fluid restriction IVF: n/a VTE: SCDs due to low hgb Prior to Admission Living Arrangement: home Anticipated Discharge Location: home Barriers to Discharge: volume status, dialysis requirement Dispo: Anticipated discharge today after dialysis.   Andrew Au, MD 01/23/2020, 1:39 PM Pager: [phone] After 5pm on weekdays and 1pm on weekends: On Call pager [phone]

## 2020-01-23 NOTE — H&P (Signed)
Attestation signed by Lucious Groves, DO at 01/21/2020 4:05 PM  Internal Medicine Attending:   I saw and examined the patient. I reviewed the resident's note and I agree with the resident's findings and plan as documented in the resident's note.           Date: 01/20/2020               Patient Name:  Jeremy Mccoy MRN: [id number]  DOB: 10/29/1965 Age / Sex: 54 y.o., male   PCP: Patient, No Pcp Per         Medical Service: Internal Medicine Teaching Service         Attending Physician: Dr. Gilles Chiquito, MD    First Contact: Dr. Bridgett Larsson Pager: [phone]  Second Contact: [place] Pager: [phone]       After Hours (After 5p/  First Contact Pager: [phone]  weekends / holidays): Second Contact Pager: [phone]   Chief Complaint: generalized weakness, body aches, cough  History of Present Illness:   Jeremy Mccoy is a 53 y/o M with hx of ESRD on MWF dialysis, HTN, COPD, history of seizure disorder, chronic cocaine use, presenting with cc of generalized weakness, body aches, and cough for the past 2 weeks. Reports that he was coughing up blood 2 weeks ago, but this has resolved and is now green sputum. Denies seeing other sources of blood loss since then. Has a myriad of other symptoms including congestion, back pain, n/v, constipation, dizziness.   He was noted to have anemia with hgb of 6.1 recently at dialysis and received 2 units pRBCs on 9/2 at the [facility]. He did not feel significantly better after this transfusion. Of note, on Type and Screen he has had positive anti-IgE antibody as far as back as 2014, requires special blood for transfusion.   At his most recent dialysis on Friday at [facility] on [address], there was an issue with IV access and he received only 1 hour before they sent him home. His next scheduled dialysis was today, but he presented to the ED for worsening symptoms, found to have a K of 7.1.   ED Course -On CXR found  to have a RUL consolidation and diffuse interstitial opacities. CT chest confirmed this, and also shows moderate R-sided pleural effusion. Started on azithromycin and ceftriaxone. -Found to be in A fib with RVR, pulse 100-170s. ED started him on diltiazem drip with documented improvement to pulse 60-70s around noon, but at time of our interview patient was again in A fib with RVR. -Found to have K of 7.1 in the setting of under-dialysis, only had 1 hour on Friday because of issue with access. Insulin and dextrose administered in ED. Nephro arranged for urgent dialysis.  Meds:      Current Outpatient Medications  Medication Instructions  . acetaminophen (TYLENOL) 500 mg, Oral, Every 8 hours PRN, 6 times a day, 500 mg, 12 to 24 tabs a day, 6,000 to 12,000 mg daily for pain  . amLODipine (NORVASC) 10 mg, Oral, Daily  . aspirin 81 mg, Oral, Daily  . B Complex-C-Zn-Folic Acid (DIALYVITE/ZINC) TABS Oral  . calcium carbonate (OS-CAL) 1250 (500 Ca) MG chewable tablet Oral  . cinacalcet (SENSIPAR) 90 MG tablet No dose, route, or frequency recorded.  . cloNIDine (CATAPRES) 0.3 mg, Oral, 3 times daily  . Diphenhydramine-APAP 12.5-325 MG TABS Oral  . doxercalciferol (HECTOROL) 0.5 MCG capsule Doxercalciferol (Hectorol)  . Etelcalcetide HCl (PARSABIV IV) Etelcalcetide (Parsabiv)  . hydrALAZINE (APRESOLINE)  25 mg, Oral, 3 times daily  . hydrOXYzine (ATARAX/VISTARIL) 25 mg, Oral, 3 times daily PRN  . Nutritional Supplements (FEEDING SUPPLEMENT, NEPRO CARB STEADY,) LIQD 237 mLs, Oral, 3 times daily PRN  . pantoprazole (PROTONIX) 20 mg, Oral, Daily  . patiromer (VELTASSA) 8.4 g packet Oral  . polyethylene glycol (MIRALAX / GLYCOLAX) 17 g, Oral, Daily PRN  . ranitidine (ZANTAC) 300 mg, Oral, Daily PRN  . sevelamer carbonate (RENVELA) 1,600 mg, Oral, 3 times daily with meals    Allergies:    Allergies as of 01/20/2020  . (No Known Allergies)       Past Medical History:  Diagnosis Date  . Anemia    . Anxiety   . Arthritis    "qwhere" (05/15/2013)  . CHF (congestive heart failure) ([place])   . COPD (chronic obstructive pulmonary disease) ([place])   . Crack cocaine use   . Dental caries   . Depression   . ESRD (end stage renal disease) on dialysis [facility])    TTS: [address] [place] (05/15/2013)  . Headache(784.0)    "get it from going to dialysis; when they get real bad I come to hospital" (05/15/2013)  . Hematemesis/vomiting blood   . History of blood transfusion 10/2011; 04/2013  . HTN (hypertension) 06/12/2012  . Seizures ([place])   . Shortness of breath    "just when I have too much potassium is too high" (05/15/2013)    Family History:       Family History  Problem Relation Age of Onset  . Heart attack Father      Social History:  Social History        Tobacco Use  . Smoking status: Former Smoker    Packs/day: 1.00    Years: 50.00    Pack years: 50.00    Types: Cigars, Cigarettes  . Smokeless tobacco: Never Used  Substance Use Topics  . Alcohol use: No    Comment: 05/15/2013 "aien't drank in ~ 25 yrs"  . Drug use: Yes    Types: Marijuana, Cocaine    Comment: Precription drugs (e.g. percocet) "I take what I have to to controll my constant pain" (05/15/2013)    Review of Systems: Review of Systems  Constitutional: Negative for chills and fever.  HENT: Positive for congestion. Negative for sore throat.   Eyes: Negative for blurred vision and double vision.  Respiratory: Positive for cough, hemoptysis (2 weeks ago, now producing green phlegm), sputum production (green) and shortness of breath.   Cardiovascular: Positive for leg swelling (improving). Negative for chest pain.  Gastrointestinal: Positive for constipation, nausea and vomiting. Negative for blood in stool, diarrhea and melena.  Genitourinary: Negative for dysuria and urgency.  Musculoskeletal: Positive for myalgias.  Skin: Negative for itching and rash.    Neurological: Positive for dizziness and weakness.    Physical Exam: Blood pressure (!) 141/98, pulse (!) 144, temperature 97.6 F (36.4 C), temperature source Oral, resp. rate 20, height 6' (1.829 m), weight 72 kg, SpO2 99 %. Physical Exam Vitals and nursing note reviewed.  Constitutional:      General: He is not in acute distress.    Appearance: He is ill-appearing.  HENT:     Head: Normocephalic and atraumatic.     Right Ear: External ear normal.     Left Ear: External ear normal.     Nose: Nose normal. No rhinorrhea.     Mouth/Throat:     Mouth: Mucous membranes are moist.  Eyes:  Extraocular Movements: Extraocular movements intact.  Cardiovascular:     Rate and Rhythm: Tachycardia present. Rhythm irregular.  Pulmonary:     Effort: Pulmonary effort is normal. No respiratory distress.     Breath sounds: Rhonchi (RUL) present.  Abdominal:     General: Abdomen is flat.     Palpations: Abdomen is soft.  Musculoskeletal:        General: No deformity.     Cervical back: Normal range of motion.     Right lower leg: No edema.     Left lower leg: No edema.  Skin:    General: Skin is warm and dry.  Neurological:     General: No focal deficit present.     Mental Status: He is alert and oriented to person, place, and time.  Psychiatric:        Mood and Affect: Mood normal.        Behavior: Behavior normal.     EKG: personally reviewed my interpretation is A fib with RVR  CXR: personally reviewed my interpretation is RUL consolidation, interstitial opacities  Assessment & Plan by Problem: Active Problems:   Cocaine abuse (HCC)   Anemia in chronic kidney disease   Essential hypertension   ESRD on hemodialysis (HCC)   PAF (paroxysmal atrial fibrillation) (HCC)   Hyperkalemia, diminished renal excretion  Jeremy Mccoy is a 54 y/o M with hx significant for ESRD on MWF dialysis, HTN, chronic cocaine use presenting with cc of generalized weakness and body aches, also  with cough for several weeks. Found to have pneumonia on imaging, hyperkalmic to 7.1 with recent under-dialysis, and new onset A fib with RVR.  Hyperkalemia ESRD, on MWF dialysis Found to have K of 7.1 in the setting of under-dialysis, only had 1 hour on Friday because of issue with access. EKG with A fib. Currently receiving dialysis. -dialysis per nephro  Paroxysmal A fib with RVR Presented with pulse 100-170s and irregularly irregular. Started on diltiazem drip in ED with improvement to pulse 60-70s around noon, but at time of our interview patient was again in A fib with RVR. Hx of transient A fib in 2015 2/2 volume overload and metabolic derangements from missed dialysis which resolved after correcting these. CHADS-VASC -continue diltiazem drip -telemetry -dialysis today  Pneumonia Pleural effusion, new On CXR found to have a RUL consolidation and diffuse interstitial opacities, as well as small to moderate pleural effusion, lymphadenopathy, and small ascites. CT chest confirmed this, and also shows moderate R-sided pleural effusion. No fevers or chills at home. Started on azithromycin and ceftriaxone. -f/u procalcitonin -continue azithromycin and ceftriaxone, day 1 -f/u CT outpatient to ensure resolution of lymphadenopathy, pleural effusion, and ascites  Normocytic anemia, anemia of renal disease vs blood loss Presented with hgb of 7.3. INR elevated to 1.4. Anemia has been an ongoing issue and he is on max dose mircera. Patient reported hematemesis 2 weeks ago which has since resolved. Received 2 units pRBC outpatient on 9/2. Type and screen with Anti E and other Ab detected. Spoke with blood bank who indicate that they may have matching blood here, or may have to order it from [place].  -if transfusion required, talk to blood bank asap so that they can locate appropriate blood -per nephro, erythropoiesis-stimulating agents and holding venofer for acute infection -may need  hematology consult no clear source of blood loss can be identified -SCDs for DVT PPx  HTN Home meds of amlodipine 50m daily, clonidine 0.360mTID, hydralazine 2556mID. Normotensive  at this time. -restart clonidine to prevent reflex tach -hold other home BP meds for now  Chronic HFpEF Echo with EF 50-55% in 2015. No symptoms or physical exam findings suggesting HF exacerbation. -monitor for signs of HF   Dispo: Admit patient to Inpatient with expected length of stay greater than 2 midnights.  Signed: Andrew Au, MD 01/20/2020, 2:46 PM  Pager: [phone]  After 5pm on weekdays and 1pm on weekends: On Call pager: [phone]     Cosigned by: Lucious Groves, DO at 01/21/2020 4:05 PM

## 2020-01-23 NOTE — Progress Notes (Signed)
Daily Rounding Note  01/23/2020, 10:08 AM  LOS: 3 days   SUBJECTIVE:   Chief complaint:   anemia Abdominal bloating better after large BM yest and 2 sdditional smaller BM's but pt still feels a bit constipated.   Stools brown, no visible blood.  Drinking MIralax now.  Going for second day in row of HD to address volume overload.     OBJECTIVE:         Vital signs in last 24 hours:    Temp:  [97.6 F (36.4 C)-98.4 F (36.9 C)] 98 F (36.7 C) (09/09 0524) Pulse Rate:  [66-113] 81 (09/08 1619) Resp:  [17-28] 18 (09/09 0524) BP: (97-157)/(68-97) 121/97 (09/09 0524) Weight:  [73.2 kg-77.2 kg] 75.4 kg (09/09 0524) Last BM Date: 01/21/20 Filed Weights   01/22/20 1229 01/22/20 1619 01/23/20 0524  Weight: 77.2 kg 73.2 kg 75.4 kg   General: looks worn out, not toxic.  Old for stated age appearance.  Comfortable, alert   Heart: Irreg, irreg.   Chest: clear bil.  Dyspnea w effort Abdomen: soft, NT, ND, active BS  Extremities: no CCE.  AV fistula on LUE Neuro/Psych:  Oriented x 3.  Garrulous/talkative, slightly anxious.  No tremors or deficits.    Intake/Output from previous day: 09/08 0701 - 09/09 0700 In: 1395.3 [P.O.:600; I.V.:65.3; Blood:630; IV Piggyback:100] Out: 3970   Intake/Output this shift: No intake/output data recorded.  Lab Results: Recent Labs    01/21/20 0550 01/22/20 0428 01/22/20 2350  WBC 7.2 7.7  --   HGB 7.0* 6.3* 8.0*  HCT 23.6* 22.0* 26.7*  PLT 259 225  --    BMET Recent Labs    01/21/20 0550 01/22/20 0428  NA 139 131*  K 5.1 5.9*  CL 97* 93*  CO2 25 23  GLUCOSE 114* 126*  BUN 59* 84*  CREATININE 8.16* 9.61*  CALCIUM 9.6 9.7   LFT Recent Labs    01/20/20 1827 01/21/20 0550 01/22/20 0428  PROT 7.0 6.4* 6.3*  ALBUMIN 2.7* 2.6* 2.5*  AST 114* 76* 56*  ALT 331* 276* 212*  ALKPHOS 129* 123 128*  BILITOT 0.6 0.5 0.6  BILIDIR 0.1 <0.1  --   IBILI 0.5  --   --     PT/INR Recent Labs    01/20/20 1030  LABPROT 16.7*  INR 1.4*   Hepatitis Panel Recent Labs    01/20/20 1827  HEPBSAG NON REACTIVE  HCVAB NON REACTIVE  HEPAIGM NON REACTIVE  HEPBIGM NON REACTIVE    Studies/Results: No results found.   Scheduled Meds: . sodium chloride   Intravenous Once  . azithromycin  250 mg Oral Daily  . Chlorhexidine Gluconate Cloth  6 each Topical Q0600  . Chlorhexidine Gluconate Cloth  6 each Topical Q0600  . cloNIDine  0.3 mg Oral TID  . diltiazem  90 mg Oral Q6H  . nicotine  14 mg Transdermal Daily  . pantoprazole  40 mg Oral Daily  . polyethylene glycol  17 g Oral BID  . sevelamer carbonate  1,600 mg Oral TID WC  . sodium chloride flush  3 mL Intravenous Q12H   Continuous Infusions: . cefTRIAXone (ROCEPHIN)  IV 1 g (01/22/20 2034)   PRN Meds:.acetaminophen **OR** acetaminophen, hydrOXYzine, ondansetron **OR** ondansetron (ZOFRAN) IV  ASSESMENT:   *   [place] anemia Mircera 272mg IV q 2 weeks- last dose 8/30 Venofer 1020mIV q HD 9/3-9/24 Hgb 6.3 >> 2 PRBC >> 8.  Awaiting pndg,  drawn Hgb/crit from this AM.    *    Elevated transaminases and alk phos.  May be sequela of shock liver, hepatic hypoperfusion a/w crack cocaine use last week.  Ascites but no liver abnormality on CT.     *   N/v, abd pain after crack cocaine use.  Duodentis, mild esophagitis per EGD 2015.  Compliant w daily PPI.    *   Chronic constipation, no bloody/melenic stools.    *   Multifocal PNA w RUL consolidation in hemodialysis pt.  Azithromycin, Rocephin in place.    *   PAF.  Not on [facility]   PLAN   *  Await pndg CBC.  If blood counts drop, pt will likely opt for inpt colon/egd but if no worsening counts, wants to have procedures as outpt.  Soonest available slots are in November.    *   Add 1 dose dulcolax at pt request.      Azucena Freed  01/23/2020, 10:08 AM Phone [phone]

## 2020-01-23 NOTE — Progress Notes (Signed)
[facility] Progress Note   Subjective:   Patient seen in AM rounds, requesting extra dialysis treatment today due to SOB. Gained 2.2kg overnight. Anxious and pacing in room in AM, calmer on reevaluation. Denies CP, palpitations, dizziness. Reports constipation but had large BM last night. He believes all his issues are caused by smoking cocaine as he could taste blood while he was using.   Planned for extra 3 hour dialysis treatment today.   Objective Vitals:   01/22/20 1615 01/22/20 1619 01/22/20 2017 01/23/20 0524  BP: 110/68 122/83  (!) 121/97  Pulse: 67 81    Resp: (!) 21 (!) 21  18  Temp:  97.7 F (36.5 C) 98.1 F (36.7 C) 98 F (36.7 C)  TempSrc:  Oral Oral   SpO2:      Weight:  73.2 kg  75.4 kg  Height:       Physical Exam General: Well developed, chronically ill appearing male in NAD Heart: RRR, no murmurs, rubs or gallops Lungs: Respirations unlabored on RA. Decreased breath sounds bilateral lower lobes, crackles R lung base Abdomen: Soft, non-tender, +BS, mildly distended Extremities: No edema b/l lower extremities Dialysis Access: LUE AVF + bruit, improving bruising  Additional Objective Labs: Basic Metabolic Panel: Recent Labs  Lab 01/20/20 0910 01/21/20 0550 01/21/20 1350 01/22/20 0428  NA 130* 139  --  131*  K 7.1* 5.1  --  5.9*  CL 88* 97*  --  93*  CO2 20* 25  --  23  GLUCOSE 96 114*  --  126*  BUN 131* 59*  --  84*  CREATININE 13.95* 8.16*  --  9.61*  CALCIUM 9.6 9.6  --  9.7  PHOS  --   --  6.7* 7.3*   Liver Function Tests: Recent Labs  Lab 01/20/20 1827 01/21/20 0550 01/22/20 0428  AST 114* 76* 56*  ALT 331* 276* 212*  ALKPHOS 129* 123 128*  BILITOT 0.6 0.5 0.6  PROT 7.0 6.4* 6.3*  ALBUMIN 2.7* 2.6* 2.5*   Recent Labs  Lab 01/20/20 1827  LIPASE 25   CBC: Recent Labs  Lab 01/20/20 0910 01/20/20 0910 01/21/20 0550 01/22/20 0428 01/22/20 2350  WBC 8.5  --  7.2 7.7  --   HGB 7.3*   < > 7.0* 6.3* 8.0*  HCT  24.6*   < > 23.6* 22.0* 26.7*  MCV 87.9  --  91.5 92.1  --   PLT 283  --  259 225  --    < > = values in this interval not displayed.   Blood Culture    Component Value Date/Time   SDES BLOOD RIGHT ARM 01/20/2020 1827   SPECREQUEST  01/20/2020 1827    BOTTLES DRAWN AEROBIC ONLY Blood Culture adequate volume   CULT  01/20/2020 1827    NO GROWTH 3 DAYS Performed at [facility], [place] [address] [place], [place] [address]    REPTSTATUS PENDING 01/20/2020 1827   Medications: . cefTRIAXone (ROCEPHIN)  IV 1 g (01/22/20 2034)   . sodium chloride   Intravenous Once  . azithromycin  250 mg Oral Daily  . Chlorhexidine Gluconate Cloth  6 each Topical Q0600  . cloNIDine  0.3 mg Oral TID  . diltiazem  90 mg Oral Q6H  . nicotine  14 mg Transdermal Daily  . pantoprazole  40 mg Oral Daily  . polyethylene glycol  17 g Oral BID  . sevelamer carbonate  1,600 mg Oral TID WC  . sodium  chloride flush  3 mL Intravenous Q12H    [facility] 180NRe Time: 4:15 hrs, BFR 500, DFR 800, EDW 67.5kg, 1K/2.5Ca, AVF 15g, no heparin Mircera 27mcg IV q 2 weeks- last dose 8/30 Venofer 100mg  IV q HD 9/3-9/24 Parsabiv 15mg  IV q HD hectorol 3 mcg IV q HD  Assessment/Plan: 1. Pneumonia: CT chest showing large R upper lobe infiltrate with patchy scattered infiltrates. Started on zithromycin and ceftriaxone. Also likely has a component of pulmonary edema,SOB improved after 4L UF on Monday but gained most of the fluid back. Planned for extra dialysis today and then resume MWF schedule, counseled on fluid restrictions.  2. Hyperkalemia: K+ 7.1on admission,improved after HD. Of note, this is not far from his baseline- outpatient K+ has been 6.9 since June. Prescribed veltassa outpatient but admits he does not take it. Patient has been counseled about importance of low K+ diet and full dialysis many times. Extra HD today then resume MWF schedule, dialysis  tomorrow can be done at outpatient unit if discharged.  3. A. Fib with RVR:New onset, on diltiazemand currently rate controlled, likely exacerbated by acute illness.  4. ESRD:On MWF schedule but chronically shortens his treatments to 2-3 hours.Next HD today. 5. Hypertension/volume:UF 4L with HD 9/6 and 4L 9/8, regained most of the fluid as above. Continue to titrate volume as tolerated. BP controlled.  6. Anemia:Hgb 6.3,received 2 units PRBC on 9/2 and max dose mircera on 01/13/20, Hgb decreased again to 6.3 yesterday and given another 2 units [place]. Last tsat 13%, ferritin 338. Not receiving venofer at present due to active infection- resume once IV antibiotics completed. Seen by GI, plan for possible outpatient work up if hemoglobin is stable.  7. Metabolic bone disease:Corrected calcium high, hold hectorol. Parsabiv not on formulary. Phos 7.3. Continue renvela - increase dose if phos remains above goal.  8. Nutrition:Renal diet/fluid restrictions recommended.  9. Cocaine use: Reports last use about 1 week before admission. He recently started attending church with a new friend who is sober, and plans to move in with this friend soon (friend lives in [place]). He does not want to change outpatient dialysis units.   Anice Paganini, PA-C 01/23/2020, 9:59 AM  [facility] Pager: [phone]

## 2020-01-23 NOTE — Progress Notes (Signed)
  Echocardiogram 2D Echocardiogram has been performed.  Darlina Sicilian M 01/23/2020, 11:32 AM

## 2020-01-24 ENCOUNTER — Telehealth: Payer: Self-pay

## 2020-01-24 ENCOUNTER — Telehealth: Payer: Self-pay | Admitting: Nurse Practitioner

## 2020-01-24 DIAGNOSIS — N186 End stage renal disease: Secondary | ICD-10-CM | POA: Diagnosis not present

## 2020-01-24 DIAGNOSIS — N2581 Secondary hyperparathyroidism of renal origin: Secondary | ICD-10-CM | POA: Diagnosis not present

## 2020-01-24 DIAGNOSIS — Z992 Dependence on renal dialysis: Secondary | ICD-10-CM | POA: Diagnosis not present

## 2020-01-24 LAB — TYPE AND SCREEN
ABO/RH(D): O POS
Antibody Screen: POSITIVE
Unit division: 0
Unit division: 0
Unit division: 0
Unit division: 0

## 2020-01-24 LAB — BPAM RBC
Blood Product Expiration Date: 202110032359
Blood Product Expiration Date: 202110042359
Blood Product Expiration Date: 202110102359
Blood Product Expiration Date: 202110122359
ISSUE DATE / TIME: 202109081346
ISSUE DATE / TIME: 202109081346
Unit Type and Rh: 5100
Unit Type and Rh: 5100
Unit Type and Rh: 5100
Unit Type and Rh: 5100

## 2020-01-24 NOTE — Telephone Encounter (Signed)
Can his procedures be completed in the [facility]?

## 2020-01-24 NOTE — Discharge Summary (Addendum)
Name: Jeremy Mccoy MRN: [id number] DOB: 06/25/1965 54 y.o. PCP: Patient, No Pcp Per  Date of Admission: 01/20/2020  8:57 AM Date of Discharge: 01/23/2020 Attending Physician: No att. providers found  Discharge Diagnosis: 1. Hyperkalemia 2. ESRD 3. Community acquired pneumonia 4. Normocytic anemia in the setting of ESRD, suspect GI loss as well 5. Atrial fibrillation 6. Cocaine use disorder 7. Tobacco use disorder  Discharge Medications: Allergies as of 01/23/2020   No Known Allergies      Medication List     STOP taking these medications    amLODipine 10 MG tablet Commonly known as: NORVASC       TAKE these medications    acetaminophen 500 MG tablet Commonly known as: TYLENOL Take 1 tablet (500 mg total) by mouth every 8 (eight) hours as needed for mild pain or fever. 6 times a day, 500 mg, 12 to 24 tabs a day, 6,000 to 12,000 mg daily for pain What changed:  how much to take additional instructions   aspirin 81 MG chewable tablet Chew 1 tablet (81 mg total) by mouth daily.   azithromycin 250 MG tablet Commonly known as: ZITHROMAX Take 1 tablet in the morning   calcium carbonate 1250 (500 Ca) MG chewable tablet Commonly known as: OS-CAL Chew by mouth.   cefdinir 300 MG capsule Commonly known as: OMNICEF Take 1 capsule after dialysis today, and 1 after dialysis tomorrow   cinacalcet 90 MG tablet Commonly known as: SENSIPAR   cloNIDine 0.3 MG tablet Commonly known as: CATAPRES Take 0.3 mg by mouth 3 (three) times daily.   Dialyvite/Zinc Tabs Take 1 tablet by mouth daily.   diltiazem 240 MG 24 hr capsule Commonly known as: Cartia XT Take 1 capsule (240 mg total) by mouth daily.   diphenhydrAMINE 25 mg capsule Commonly known as: BENADRYL Take 25 mg by mouth every 6 (six) hours as needed for itching.   Diphenhydramine-APAP 12.5-325 MG Tabs Take by mouth.   doxercalciferol 0.5 MCG capsule Commonly known as: HECTOROL Doxercalciferol  (Hectorol)   feeding supplement (NEPRO CARB STEADY) Liqd Take 237 mLs by mouth 3 (three) times daily as needed (Supplement). What changed: when to take this   hydrALAZINE 25 MG tablet Commonly known as: APRESOLINE Take 25 mg by mouth 3 (three) times daily.   hydrOXYzine 25 MG tablet Commonly known as: ATARAX/VISTARIL Take 25 mg by mouth in the morning, at noon, and at bedtime.   nicotine 14 mg/24hr patch Commonly known as: NICODERM CQ - dosed in mg/24 hours Place 1 patch (14 mg total) onto the skin daily.   pantoprazole 40 MG tablet Commonly known as: PROTONIX Take 1 tablet (40 mg total) by mouth daily. What changed:  medication strength how much to take   PARSABIV IV Etelcalcetide (Parsabiv)   polyethylene glycol 17 g packet Commonly known as: MIRALAX / GLYCOLAX Take 17 g by mouth daily as needed for mild constipation.   ranitidine 300 MG tablet Commonly known as: ZANTAC Take 300 mg by mouth daily as needed for heartburn.   sevelamer carbonate 800 MG tablet Commonly known as: RENVELA Take 2 tablets (1,600 mg total) by mouth 3 (three) times daily with meals. What changed:  how much to take additional instructions   Veltassa 8.4 g packet Generic drug: patiromer Take 8.4 g by mouth as directed.        Disposition and follow-up:   [name] was discharged from [facility] in Stable condition.  At the  hospital follow up visit please address:  1.  Follow up     A. ESRD - continue outpatient dialyysis     B. Community acquired pneumonia, pleural effusion - assess for resolution of Sx, repeat CXR to ensures resolution of pleural effusion and lymphadenopathy     C. Anemia - follow up with GI for potential endoscopy/colonoscopy, may require further outpatient transfusions     D. Atrial fibrillation - evaluate for resolution, discharged with rate control and ASA only due to significant anemia, consider anticoagulation if anemia stabilizes      E. Cocaine and tobacco use disorders - assess for discontinuation  2.  Labs / imaging needed at time of follow-up: CBC, BMP, CT chest  3.  Pending labs/ test needing follow-up: none  Follow-up Appointments:    Hyperkalemia ESRD, on MWF dialysis History of ESRD, on MWF dialysis. Presented with K of 7.1 on Monday after being dialyzed for only 1 hour on the previous Friday due to problem with access. Apparently chronically has problem tolerating his entire dialysis session, and chronically elevated K to above 6. His K improved with dialysis. Was dialyzed on Monday and Wednesday of this admission, with an additional session on Thursday for volume overload. Patient will continue dialysis at his outpatient center.  Community acquired Pneumonia  Pleural effusion Patient presented with several weeks of coughing that produced green sputum, and 2 weeks of weakness. On chest imaging found to have a RUL consolidation and diffuse interstitial opacities, as well as small to moderate pleural effusion, lymphadenopathy, and small ascites. Noted to have significant smoking history. Treated with azithromycin and ceftriaxone, with rapid clinical improvement. Discharged with azithromycin and Cefdinir to finish his course. Repeat CXR 4-6 weeks after completing treatment course to ensure resolution of pleural effusion and lymphadenopathy.   Normocytic anemia in the setting of ESRD Patient presented with hgb of 7.3, in the setting of chronic anemia for which he is on erythropoietin stimulating agents per nephro. Recently had outpatient infusion of 2 units pRBCs. During admission, had slow decline of hgb, received 2 more units this admission. Besides mild hematemesis 2 weeks ago, he denies any other source of bleeding. He was evaluated by GI and offered endoscopy and colonoscopy, but patient declined. He was encouraged to stay inpatient to complete workup of anemia, but he insisted on discharge. Hgb was stable at 7.9 on  day of discharge, GI agreed with follow up outpatient.     New onset atrial fibrillation Patient presented with A fib with RVR, this was controlled with diltiazem. Had hx of A fib in 2015 which did resolve with treatment of metabolic derangements with dialysis. Unfortunately has not yet resolved with dialysis and pneumonia Tx. Echo with severe LA dilation. CHADSVASC score of 2 (possibly 1, given echo with EF of 50-55% but never required HF treatment), however anticoagulation was contraindicated given his declining hemoglobin. Discharged with diltiazem, follow up with PCP.  Cocaine use disorder Tobacco use disorder Patient reports long-standing use of cocaine and tobacco, though not in the past week. He is motivated to quit, and will be moving to a new house in [place] with a friend, and believes this will help him quit.  Discharge Vitals:   BP 135/80 (BP Location: Right Arm)    Pulse 83    Temp 97.8 F (36.6 C) (Oral)    Resp 20    Ht 6' (1.829 m)    Wt 72.6 kg    SpO2 96%    BMI 21.71  kg/m   Pertinent Labs, Studies, and Procedures:  CT ABDOMEN PELVIS WO CONTRAST  Result Date: 01/20/2020 CLINICAL DATA:  Diffuse abdominal pain. Concern for diverticulitis. Generalized weakness, cough, body aches for few days. EXAM: CT CHEST, ABDOMEN AND PELVIS WITHOUT CONTRAST TECHNIQUE: Multidetector CT imaging of the chest, abdomen and pelvis was performed following the standard protocol without IV contrast. COMPARISON:  Chest x-ray from earlier same day. Chest CT dated 07/25/2015. FINDINGS: CT CHEST FINDINGS Cardiovascular: No pericardial effusion. No thoracic aortic aneurysm. Densely calcified mitral valve annulus. Mediastinum/Nodes: Conglomerate lymphadenopathy within the mediastinum and bilateral perihilar regions, some calcified and some noncalcified. Esophagus is unremarkable. Trachea and central bronchi are unremarkable. Lungs/Pleura: Large dense consolidation within the RIGHT upper lobe, additional  patchy smaller consolidations within the RIGHT upper lobe. RIGHT pleural effusion, small to moderate in size. Diffuse ground-glass opacities bilaterally, likely accentuated by areas of air trapping. Emphysematous changes bilaterally, mild to moderate in degree. Scattered calcified granulomas within each lung. No suspicious appearing pulmonary nodule or mass. No pneumothorax. Musculoskeletal: No acute findings. Diffuse heterogeneous mineralization of the osseous structures. CT ABDOMEN PELVIS FINDINGS Hepatobiliary: No focal liver abnormality is seen. Gallbladder is not well visualized. No bile duct dilatation is seen. Pancreas: Not well seen.  Grossly normal. Spleen: Normal in size without focal abnormality. Adrenals/Urinary Tract: Both kidneys are atrophic. Stomach/Bowel: No dilated large or small bowel loops. Scattered diverticulosis but no focal inflammatory changes seen to suggest acute diverticulitis, although characterization of the bowel walls is limited by the lack of oral contrast. Appendix is not seen. Vascular/Lymphatic: Aortic atherosclerosis. No enlarged lymph nodes are visualized within the abdomen or pelvis. Reproductive: Prostate is unremarkable. Other: Moderate amount of ascites. No abscess collection identified. No free intraperitoneal air. Musculoskeletal: No acute findings. Diffuse heterogeneous mineralization of the osseous structures. IMPRESSION: 1. Large dense consolidation within the RIGHT upper lobe, with additional patchy smaller consolidations within the RIGHT upper lobe, consistent with multifocal pneumonia. 2. Conglomerate lymphadenopathy within the mediastinum and bilateral perihilar regions, some calcified and some noncalcified. This is most likely reactive in the absence of a known underlying malignancy, with a granulomatous etiology favored. 3. RIGHT pleural effusion, small to moderate in size. 4. Diffuse ground-glass opacities bilaterally, likely accentuated by areas of air trapping.  Differential includes atypical pneumonias such as viral or fungal, interstitial pneumonias, edema related to volume overload/CHF, chronic interstitial diseases, hypersensitivity pneumonitis, and respiratory bronchiolitis. 5. Moderate amount of ascites. No abscess collection identified. No free intraperitoneal air. 6. Colonic diverticulosis without evidence of acute diverticulitis. 7. 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 8. Atrophic kidneys. Aortic Atherosclerosis (ICD10-I70.0) and Emphysema (ICD10-J43.9). Electronically Signed   By: Franki Cabot M.D.   On: 01/20/2020 11:41   DG Chest 2 View  Result Date: 01/20/2020 CLINICAL DATA:  Generalized weakness EXAM: CHEST - 2 VIEW COMPARISON:  07/25/2019 FINDINGS: Focal right upper lobe consolidation. Diffuse interstitial opacity with fissure thickening. Cardiomegaly that is chronic. Sclerotic appearance of the bones attributed to patient's end-stage renal disease. IMPRESSION: Right upper lobe pneumonia. Followup PA and lateral chest X-ray is recommended in 3-4 weeks following trial of antibiotic therapy to ensure resolution. Interstitial pulmonary edema. Electronically Signed   By: Monte Fantasia M.D.   On: 01/20/2020 09:42   CT Chest Wo Contrast  Result Date: 01/20/2020 CLINICAL DATA:  Diffuse abdominal pain.  Concern for diverticulitis. Generalized weakness, cough, body aches for few days. EXAM: CT CHEST, ABDOMEN AND PELVIS WITHOUT CONTRAST TECHNIQUE: Multidetector CT imaging of the chest, abdomen and pelvis was performed following the standard protocol without IV contrast. COMPARISON:  Chest x-ray from earlier same day. Chest CT dated 07/25/2015. FINDINGS: CT CHEST FINDINGS Cardiovascular: No pericardial effusion. No thoracic aortic aneurysm. Densely  calcified mitral valve annulus. Mediastinum/Nodes: Conglomerate lymphadenopathy within the mediastinum and bilateral perihilar regions, some calcified and some noncalcified. Esophagus is unremarkable. Trachea and central bronchi are unremarkable. Lungs/Pleura: Large dense consolidation within the RIGHT upper lobe, additional patchy smaller consolidations within the RIGHT upper lobe. RIGHT pleural effusion, small to moderate in size. Diffuse ground-glass opacities bilaterally, likely accentuated by areas of air trapping. Emphysematous changes bilaterally, mild to moderate in degree. Scattered calcified granulomas within each lung. No suspicious appearing pulmonary nodule or mass. No pneumothorax. Musculoskeletal: No acute findings. Diffuse heterogeneous mineralization of the osseous structures. CT ABDOMEN PELVIS FINDINGS Hepatobiliary: No focal liver abnormality is seen. Gallbladder is not well visualized. No bile duct dilatation is seen. Pancreas: Not well seen.  Grossly normal. Spleen: Normal in size without focal abnormality. Adrenals/Urinary Tract: Both kidneys are atrophic. Stomach/Bowel: No dilated large or small bowel loops. Scattered diverticulosis but no focal inflammatory changes seen to suggest acute diverticulitis, although characterization of the bowel walls is limited by the lack of oral contrast. Appendix is not seen. Vascular/Lymphatic: Aortic atherosclerosis. No enlarged lymph nodes are visualized within the abdomen or pelvis. Reproductive: Prostate is unremarkable. Other: Moderate amount of ascites. No abscess collection identified. No free intraperitoneal air. Musculoskeletal: No acute findings. Diffuse heterogeneous mineralization of the osseous structures. IMPRESSION: 1. Large dense consolidation within the RIGHT upper lobe, with additional patchy smaller consolidations within the RIGHT upper lobe, consistent with multifocal pneumonia. 2. Conglomerate lymphadenopathy within the mediastinum and  bilateral perihilar regions, some calcified and some noncalcified. This is most likely reactive in the absence of a known underlying malignancy, with a granulomatous etiology favored. 3. RIGHT pleural effusion, small to moderate in size. 4. Diffuse ground-glass opacities bilaterally, likely accentuated by areas of air trapping. Differential includes atypical pneumonias such as viral or fungal, interstitial pneumonias, edema related to volume overload/CHF, chronic interstitial diseases, hypersensitivity pneumonitis, and respiratory bronchiolitis. 5. Moderate amount of ascites. No abscess collection identified. No free intraperitoneal air. 6. Colonic diverticulosis without evidence of acute diverticulitis. 7. 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 8. Atrophic kidneys. Aortic Atherosclerosis (ICD10-I70.0) and Emphysema (ICD10-J43.9). Electronically Signed   By: Franki Cabot M.D.   On: 01/20/2020 11:41    CBC Latest Ref Rng & Units 01/23/2020 01/22/2020 01/22/2020  WBC 4.0 - 10.5 K/uL 7.5 - 7.7  Hemoglobin 13.0 - 17.0 g/dL 7.9(L) 8.0(L) 6.3(LL)  Hematocrit 39 - 52 % 26.7(L) 26.7(L) 22.0(L)  Platelets 150 - 400 K/uL 247 - 225     Discharge Instructions: Discharge Instructions     Call MD for:  extreme fatigue   Complete by: As directed    Call MD for:  persistant dizziness or light-headedness   Complete by: As directed    Diet - low sodium heart healthy   Complete by: As directed    Discharge instructions   Complete by: As directed    Mr. Schoenfeld, it has been a pleasure taking care of you. We found  that you had a pneumonia during this admission which we are treating with antibiotics. We are also concerned about your anemia, which GI will see you for outpatient. We are also starting you on a  medication for your heart rhythm, which is known as atrial fibrillation. Otherwise, keep doing a good job with dialysis.  Here are your discharge instructions.  These two are your antibiotics: 1) start azithromycin, 1 tablet daily, 1 day left 2) start cefdinir, take 1 tablet after dialysis today and tomorrow  3) start diltiazem, 1 tablet daily. This will help control your heart rhythm 4) STOP your amlodipine (Norvasc)  4) follow up with your nephrologist 5) follow up with GI 6) follow up with your PCP. Our clinic will be calling you to make an appointment   Increase activity slowly   Complete by: As directed        Signed: Andrew Au, MD 01/24/2020, 2:11 PM   Pager: [phone]

## 2020-01-24 NOTE — Telephone Encounter (Signed)
TOC per Dr. Bridgett Larsson, appt 9/21/202 @ 9:15.

## 2020-01-24 NOTE — Telephone Encounter (Signed)
Transition of care contact from inpatient facility  Date of Discharge: 01/23/2020 Date of Contact: 01/24/2020 Method of contact: Phone  Attempted to contact patient to discuss transition of care from inpatient admission. Patient did not answer the phone. Voicemail not set up. Unable to leave message.

## 2020-01-24 NOTE — Telephone Encounter (Signed)
-----   Message from Irving Copas., MD sent at 01/24/2020  5:59 AM EDT ----- Regarding: Follow-up Jeremy Mccoy,This patient has been discharged or will be discharged in the next day or so.  Can you reach out to him next week and get him set up for a hepatic function panel, INR in 2 weeks time.He can be set up for an EGD/colonoscopy direct procedure in approximately 4 to 6 weeks which will have to be coordinated on the day since he is a dialysis patient.Should he choose to not want to follow-up or have an appointment then that is his decision and you have been documented in the chart.Thanks.GM

## 2020-01-25 LAB — CULTURE, BLOOD (SINGLE)
Culture: NO GROWTH
Special Requests: ADEQUATE

## 2020-01-27 DIAGNOSIS — N2581 Secondary hyperparathyroidism of renal origin: Secondary | ICD-10-CM | POA: Diagnosis not present

## 2020-01-27 DIAGNOSIS — Z992 Dependence on renal dialysis: Secondary | ICD-10-CM | POA: Diagnosis not present

## 2020-01-27 DIAGNOSIS — N186 End stage renal disease: Secondary | ICD-10-CM | POA: Diagnosis not present

## 2020-01-27 NOTE — Telephone Encounter (Signed)
Yes the procedures can be done in the [facility]. They will need to be coordinated with his dialysis days. Thanks. GM

## 2020-01-27 NOTE — Telephone Encounter (Signed)
Jeremy Mccoy can you please schedule this pt for endo colon in the [facility] with previsit?  Thank you

## 2020-01-28 ENCOUNTER — Ambulatory Visit: Payer: Medicare HMO

## 2020-01-28 NOTE — Progress Notes (Deleted)
[facility] [place] HISTORY AND PHYSICAL   History of Present Illness:  Patient is a 54 y.o. year old male who presents for recheck of his AV fistula.  He has some thinning areas of skin from repeated use.  The patients fistula was initially placed by Dr. Scot Dock in July of 2013.  He has had several IR thrombectomies and also some fistulogram's performed at [facility] over the past 8 years.  He has been resting the the fistula at the distal abrasion area and is here today for exam and possible consideration of plication if need be.   Past Medical History:  Diagnosis Date  . Anemia   . Anxiety   . Arthritis    "qwhere" (05/15/2013)  . CHF (congestive heart failure) ([place])   . COPD (chronic obstructive pulmonary disease) ([place])   . Crack cocaine use   . Dental caries   . Depression   . ESRD (end stage renal disease) on dialysis [facility])    TTS: [address] [place] (05/15/2013)  . Headache(784.0)    "get it from going to dialysis; when they get real bad I come to hospital" (05/15/2013)  . Hematemesis/vomiting blood   . History of blood transfusion 10/2011; 04/2013  . HTN (hypertension) 06/12/2012  . Seizures ([place])   . Shortness of breath    "just when I have too much potassium is too high" (05/15/2013)    Past Surgical History:  Procedure Laterality Date  . AV FISTULA PLACEMENT  11/29/2011   Procedure: ARTERIOVENOUS (AV) FISTULA CREATION;  Surgeon: Angelia Mould, MD;  Location: [facility] OR;  Service: Vascular;  Laterality: Left;  . ESOPHAGOGASTRODUODENOSCOPY N/A 05/17/2013   Procedure: ESOPHAGOGASTRODUODENOSCOPY (EGD);  Surgeon: Beryle Beams, MD;  Location: [facility] ENDOSCOPY;  Service: Endoscopy;  Laterality: N/A;  . INGUINAL HERNIA REPAIR Bilateral 1967  . IR FLUORO GUIDE CV LINE RIGHT  10/20/2017  . IR THROMBECTOMY AV FISTULA W/THROMBOLYSIS/PTA INC/SHUNT/IMG LEFT Left 10/24/2017  . IR US GUIDE VASC ACCESS LEFT  10/24/2017  . IR US GUIDE VASC ACCESS RIGHT  10/20/2017  .  RENAL BIOPSY  11/07/2011     Social History Social History   Tobacco Use  . Smoking status: Former Smoker    Packs/day: 1.00    Years: 50.00    Pack years: 50.00    Types: Cigars, Cigarettes  . Smokeless tobacco: Never Used  Substance Use Topics  . Alcohol use: No    Comment: 05/15/2013 "aien't drank in ~ 25 yrs"  . Drug use: Yes    Types: Marijuana, Cocaine    Comment: Precription drugs (e.g. percocet) "I take what I have to to controll my constant pain" (05/15/2013)    Family History Family History  Problem Relation Age of Onset  . Heart attack Father     Allergies  No Known Allergies   Current Outpatient Medications  Medication Sig Dispense Refill  . acetaminophen (TYLENOL) 500 MG tablet Take 1 tablet (500 mg total) by mouth every 8 (eight) hours as needed for mild pain or fever. 6 times a day, 500 mg, 12 to 24 tabs a day, 6,000 to 12,000 mg daily for pain (Patient taking differently: Take 500-1,000 mg by mouth every 8 (eight) hours as needed for mild pain or fever. ) 30 tablet 0  . aspirin 81 MG chewable tablet Chew 1 tablet (81 mg total) by mouth daily. (Patient not taking: Reported on 01/20/2020) 30 tablet 11  . azithromycin (ZITHROMAX) 250 MG tablet Take 1 tablet  in the morning 1 each 0  . B Complex-C-Zn-Folic Acid (DIALYVITE/ZINC) TABS Take 1 tablet by mouth daily.     . calcium carbonate (OS-CAL) 1250 (500 Ca) MG chewable tablet Chew by mouth. (Patient not taking: Reported on 01/20/2020)    . cefdinir (OMNICEF) 300 MG capsule Take 1 capsule after dialysis today, and 1 after dialysis tomorrow 2 capsule 0  . cinacalcet (SENSIPAR) 90 MG tablet  (Patient not taking: Reported on 01/20/2020)    . cloNIDine (CATAPRES) 0.3 MG tablet Take 0.3 mg by mouth 3 (three) times daily.     Marland Kitchen diltiazem (CARTIA XT) 240 MG 24 hr capsule Take 1 capsule (240 mg total) by mouth daily. 30 capsule 0  . diphenhydrAMINE (BENADRYL) 25 mg capsule Take 25 mg by mouth every 6 (six) hours as needed for  itching.    . Diphenhydramine-APAP 12.5-325 MG TABS Take by mouth. (Patient not taking: Reported on 01/20/2020)    . doxercalciferol (HECTOROL) 0.5 MCG capsule Doxercalciferol (Hectorol)    . Etelcalcetide HCl (PARSABIV IV) Etelcalcetide (Parsabiv)    . hydrALAZINE (APRESOLINE) 25 MG tablet Take 25 mg by mouth 3 (three) times daily.    . hydrOXYzine (ATARAX/VISTARIL) 25 MG tablet Take 25 mg by mouth in the morning, at noon, and at bedtime.   6  . nicotine (NICODERM CQ - DOSED IN MG/24 HOURS) 14 mg/24hr patch Place 1 patch (14 mg total) onto the skin daily. 14 patch 0  . Nutritional Supplements (FEEDING SUPPLEMENT, NEPRO CARB STEADY,) LIQD Take 237 mLs by mouth 3 (three) times daily as needed (Supplement). (Patient taking differently: Take 237 mLs by mouth 3 (three) times a week. )  0  . pantoprazole (PROTONIX) 40 MG tablet Take 1 tablet (40 mg total) by mouth daily. 30 tablet 0  . patiromer (VELTASSA) 8.4 g packet Take 8.4 g by mouth as directed.     . polyethylene glycol (MIRALAX / GLYCOLAX) packet Take 17 g by mouth daily as needed for mild constipation. 14 each 0  . ranitidine (ZANTAC) 300 MG tablet Take 300 mg by mouth daily as needed for heartburn. (Patient not taking: Reported on 01/20/2020)    . sevelamer carbonate (RENVELA) 800 MG tablet Take 2 tablets (1,600 mg total) by mouth 3 (three) times daily with meals. (Patient taking differently: Take 800-2,400 mg by mouth 3 (three) times daily with meals. Take 2,400 mg by mouth three times a day with meals and 800-1,600 mg with each snack) 90 tablet 0   No current facility-administered medications for this visit.    ROS:   General:  No weight loss, Fever, chills  HEENT: No recent headaches, no nasal bleeding, no visual changes, no sore throat  Neurologic: No dizziness, blackouts, seizures. No recent symptoms of stroke or mini- stroke. No recent episodes of slurred speech, or temporary blindness.  Cardiac: No recent episodes of chest  pain/pressure, no shortness of breath at rest.  No shortness of breath with exertion.  Denies history of atrial fibrillation or irregular heartbeat  Vascular: No history of rest pain in feet.  No history of claudication.  No history of non-healing ulcer, No history of DVT   Pulmonary: No home oxygen, no productive cough, no hemoptysis,  No asthma or wheezing  Musculoskeletal:  [ ]  Arthritis, [ ]  Low back pain,  [ ]  Joint pain  Hematologic:No history of hypercoagulable state.  No history of easy bleeding.  No history of anemia  Gastrointestinal: No hematochezia or melena,  No gastroesophageal reflux,  no trouble swallowing  Urinary: [ ]  chronic Kidney disease, [ ]  on HD - [ ]  MWF or [ ]  TTHS, [ ]  Burning with urination, [ ]  Frequent urination, [ ]  Difficulty urinating;   Skin: No rashes  Psychological: No history of anxiety,  No history of depression   Physical Examination  There were no vitals filed for this visit.  There is no height or weight on file to calculate BMI.  General:  Alert and oriented, no acute distress HEENT: Normal Neck: No bruit or JVD Pulmonary: Clear to auscultation bilaterally Cardiac: Regular Rate and Rhythm without murmur Gastrointestinal: Soft, non-tender, non-distended, no mass, no scars Skin: No rash Extremity Pulses:  2+ radial, brachial pulses bilaterally Musculoskeletal: No deformity or edema  Neurologic: Upper and lower extremity motor 5/5 and symmetric  DATA:    ASSESSMENT:    PLAN:  Roxy Horseman PA-C [facility] Office: [phone]  MD in clinic [place]

## 2020-01-29 DIAGNOSIS — Z992 Dependence on renal dialysis: Secondary | ICD-10-CM | POA: Diagnosis not present

## 2020-01-29 DIAGNOSIS — N2581 Secondary hyperparathyroidism of renal origin: Secondary | ICD-10-CM | POA: Diagnosis not present

## 2020-01-29 DIAGNOSIS — N186 End stage renal disease: Secondary | ICD-10-CM | POA: Diagnosis not present

## 2020-01-29 NOTE — Telephone Encounter (Signed)
Hi Patty, I spoke with pt and he was not feeling well. He asked me to call him in about a week so I told him that I will call him next Friday.

## 2020-01-31 DIAGNOSIS — T782XXA Anaphylactic shock, unspecified, initial encounter: Secondary | ICD-10-CM | POA: Insufficient documentation

## 2020-01-31 DIAGNOSIS — N2581 Secondary hyperparathyroidism of renal origin: Secondary | ICD-10-CM | POA: Diagnosis not present

## 2020-01-31 DIAGNOSIS — Z992 Dependence on renal dialysis: Secondary | ICD-10-CM | POA: Diagnosis not present

## 2020-01-31 DIAGNOSIS — T7840XA Allergy, unspecified, initial encounter: Secondary | ICD-10-CM | POA: Insufficient documentation

## 2020-01-31 DIAGNOSIS — N186 End stage renal disease: Secondary | ICD-10-CM | POA: Diagnosis not present

## 2020-02-03 DIAGNOSIS — Z992 Dependence on renal dialysis: Secondary | ICD-10-CM | POA: Diagnosis not present

## 2020-02-03 DIAGNOSIS — N186 End stage renal disease: Secondary | ICD-10-CM | POA: Diagnosis not present

## 2020-02-03 DIAGNOSIS — N2581 Secondary hyperparathyroidism of renal origin: Secondary | ICD-10-CM | POA: Diagnosis not present

## 2020-02-03 DIAGNOSIS — I4891 Unspecified atrial fibrillation: Secondary | ICD-10-CM | POA: Diagnosis not present

## 2020-02-03 DIAGNOSIS — J810 Acute pulmonary edema: Secondary | ICD-10-CM | POA: Diagnosis not present

## 2020-02-03 DIAGNOSIS — I509 Heart failure, unspecified: Secondary | ICD-10-CM | POA: Diagnosis not present

## 2020-02-03 DIAGNOSIS — J189 Pneumonia, unspecified organism: Secondary | ICD-10-CM | POA: Diagnosis not present

## 2020-02-03 DIAGNOSIS — E875 Hyperkalemia: Secondary | ICD-10-CM | POA: Diagnosis not present

## 2020-02-04 ENCOUNTER — Encounter: Payer: Medicare HMO | Admitting: Student

## 2020-02-05 DIAGNOSIS — N186 End stage renal disease: Secondary | ICD-10-CM | POA: Diagnosis not present

## 2020-02-05 DIAGNOSIS — N2581 Secondary hyperparathyroidism of renal origin: Secondary | ICD-10-CM | POA: Diagnosis not present

## 2020-02-05 DIAGNOSIS — Z992 Dependence on renal dialysis: Secondary | ICD-10-CM | POA: Diagnosis not present

## 2020-02-07 DIAGNOSIS — N2581 Secondary hyperparathyroidism of renal origin: Secondary | ICD-10-CM | POA: Diagnosis not present

## 2020-02-07 DIAGNOSIS — Z992 Dependence on renal dialysis: Secondary | ICD-10-CM | POA: Diagnosis not present

## 2020-02-07 DIAGNOSIS — N186 End stage renal disease: Secondary | ICD-10-CM | POA: Diagnosis not present

## 2020-02-07 NOTE — Telephone Encounter (Addendum)
Fyi,  Left message to call back.

## 2020-02-10 DIAGNOSIS — Z992 Dependence on renal dialysis: Secondary | ICD-10-CM | POA: Diagnosis not present

## 2020-02-10 DIAGNOSIS — N2581 Secondary hyperparathyroidism of renal origin: Secondary | ICD-10-CM | POA: Diagnosis not present

## 2020-02-10 DIAGNOSIS — N186 End stage renal disease: Secondary | ICD-10-CM | POA: Diagnosis not present

## 2020-02-12 DIAGNOSIS — N186 End stage renal disease: Secondary | ICD-10-CM | POA: Diagnosis not present

## 2020-02-12 DIAGNOSIS — N2581 Secondary hyperparathyroidism of renal origin: Secondary | ICD-10-CM | POA: Diagnosis not present

## 2020-02-12 DIAGNOSIS — Z992 Dependence on renal dialysis: Secondary | ICD-10-CM | POA: Diagnosis not present

## 2020-02-13 DIAGNOSIS — N186 End stage renal disease: Secondary | ICD-10-CM | POA: Diagnosis not present

## 2020-02-13 DIAGNOSIS — I10 Essential (primary) hypertension: Secondary | ICD-10-CM | POA: Diagnosis not present

## 2020-02-13 DIAGNOSIS — Z992 Dependence on renal dialysis: Secondary | ICD-10-CM | POA: Diagnosis not present

## 2020-02-14 ENCOUNTER — Other Ambulatory Visit: Payer: Self-pay | Admitting: Student

## 2020-02-14 DIAGNOSIS — N186 End stage renal disease: Secondary | ICD-10-CM | POA: Diagnosis not present

## 2020-02-14 DIAGNOSIS — Z992 Dependence on renal dialysis: Secondary | ICD-10-CM | POA: Diagnosis not present

## 2020-02-14 DIAGNOSIS — N2581 Secondary hyperparathyroidism of renal origin: Secondary | ICD-10-CM | POA: Diagnosis not present

## 2020-02-17 DIAGNOSIS — N186 End stage renal disease: Secondary | ICD-10-CM | POA: Diagnosis not present

## 2020-02-17 DIAGNOSIS — Z992 Dependence on renal dialysis: Secondary | ICD-10-CM | POA: Diagnosis not present

## 2020-02-17 DIAGNOSIS — N2581 Secondary hyperparathyroidism of renal origin: Secondary | ICD-10-CM | POA: Diagnosis not present

## 2020-02-18 ENCOUNTER — Encounter: Payer: Self-pay | Admitting: Gastroenterology

## 2020-02-18 NOTE — Telephone Encounter (Signed)
Thank you so much Cathy.

## 2020-02-18 NOTE — Telephone Encounter (Signed)
Hi Patty, pt's dialysis provider Neoma Laming called to f/u on appts for pt. I went ahead and scheduled procedures and pre-visit. I let her know that Dr. Rush Landmark wanted pt to have a hepatic function panel and INR. She stated that she was going to see if he already had it done, if not see if they can draw it there abd send [place] results. Her phone number  In case you need to speak with her is [phone]. Thank you.

## 2020-02-19 DIAGNOSIS — N186 End stage renal disease: Secondary | ICD-10-CM | POA: Diagnosis not present

## 2020-02-19 DIAGNOSIS — Z992 Dependence on renal dialysis: Secondary | ICD-10-CM | POA: Diagnosis not present

## 2020-02-19 DIAGNOSIS — N2581 Secondary hyperparathyroidism of renal origin: Secondary | ICD-10-CM | POA: Diagnosis not present

## 2020-02-19 NOTE — Telephone Encounter (Signed)
Pt did not come to his HFU appt on 9/21.

## 2020-02-21 DIAGNOSIS — N186 End stage renal disease: Secondary | ICD-10-CM | POA: Diagnosis not present

## 2020-02-21 DIAGNOSIS — N2581 Secondary hyperparathyroidism of renal origin: Secondary | ICD-10-CM | POA: Diagnosis not present

## 2020-02-21 DIAGNOSIS — Z992 Dependence on renal dialysis: Secondary | ICD-10-CM | POA: Diagnosis not present

## 2020-02-23 DIAGNOSIS — K922 Gastrointestinal hemorrhage, unspecified: Secondary | ICD-10-CM | POA: Diagnosis not present

## 2020-02-23 DIAGNOSIS — U071 COVID-19: Secondary | ICD-10-CM | POA: Diagnosis not present

## 2020-02-23 DIAGNOSIS — J9600 Acute respiratory failure, unspecified whether with hypoxia or hypercapnia: Secondary | ICD-10-CM | POA: Diagnosis not present

## 2020-02-23 DIAGNOSIS — E876 Hypokalemia: Secondary | ICD-10-CM | POA: Diagnosis not present

## 2020-02-23 DIAGNOSIS — J90 Pleural effusion, not elsewhere classified: Secondary | ICD-10-CM | POA: Diagnosis not present

## 2020-02-23 DIAGNOSIS — E8770 Fluid overload, unspecified: Secondary | ICD-10-CM | POA: Diagnosis not present

## 2020-02-23 DIAGNOSIS — J9690 Respiratory failure, unspecified, unspecified whether with hypoxia or hypercapnia: Secondary | ICD-10-CM | POA: Diagnosis not present

## 2020-02-23 DIAGNOSIS — D631 Anemia in chronic kidney disease: Secondary | ICD-10-CM | POA: Diagnosis not present

## 2020-02-23 DIAGNOSIS — R58 Hemorrhage, not elsewhere classified: Secondary | ICD-10-CM | POA: Diagnosis not present

## 2020-02-23 DIAGNOSIS — Z720 Tobacco use: Secondary | ICD-10-CM | POA: Diagnosis not present

## 2020-02-23 DIAGNOSIS — Z87891 Personal history of nicotine dependence: Secondary | ICD-10-CM | POA: Diagnosis not present

## 2020-02-23 DIAGNOSIS — I959 Hypotension, unspecified: Secondary | ICD-10-CM | POA: Diagnosis not present

## 2020-02-23 DIAGNOSIS — J9601 Acute respiratory failure with hypoxia: Secondary | ICD-10-CM | POA: Diagnosis not present

## 2020-02-23 DIAGNOSIS — Z79899 Other long term (current) drug therapy: Secondary | ICD-10-CM | POA: Diagnosis not present

## 2020-02-23 DIAGNOSIS — R1013 Epigastric pain: Secondary | ICD-10-CM | POA: Diagnosis not present

## 2020-02-23 DIAGNOSIS — R000 Tachycardia, unspecified: Secondary | ICD-10-CM | POA: Diagnosis not present

## 2020-02-23 DIAGNOSIS — I4892 Unspecified atrial flutter: Secondary | ICD-10-CM | POA: Diagnosis not present

## 2020-02-23 DIAGNOSIS — I10 Essential (primary) hypertension: Secondary | ICD-10-CM | POA: Diagnosis not present

## 2020-02-23 DIAGNOSIS — N2581 Secondary hyperparathyroidism of renal origin: Secondary | ICD-10-CM | POA: Diagnosis not present

## 2020-02-23 DIAGNOSIS — K921 Melena: Secondary | ICD-10-CM | POA: Diagnosis not present

## 2020-02-23 DIAGNOSIS — R9431 Abnormal electrocardiogram [ECG] [EKG]: Secondary | ICD-10-CM | POA: Diagnosis not present

## 2020-02-23 DIAGNOSIS — R7989 Other specified abnormal findings of blood chemistry: Secondary | ICD-10-CM | POA: Diagnosis not present

## 2020-02-23 DIAGNOSIS — Z7982 Long term (current) use of aspirin: Secondary | ICD-10-CM | POA: Diagnosis not present

## 2020-02-23 DIAGNOSIS — Z9981 Dependence on supplemental oxygen: Secondary | ICD-10-CM | POA: Diagnosis not present

## 2020-02-23 DIAGNOSIS — I5021 Acute systolic (congestive) heart failure: Secondary | ICD-10-CM | POA: Diagnosis not present

## 2020-02-23 DIAGNOSIS — R918 Other nonspecific abnormal finding of lung field: Secondary | ICD-10-CM | POA: Diagnosis not present

## 2020-02-23 DIAGNOSIS — Z992 Dependence on renal dialysis: Secondary | ICD-10-CM | POA: Diagnosis not present

## 2020-02-23 DIAGNOSIS — I483 Typical atrial flutter: Secondary | ICD-10-CM | POA: Diagnosis not present

## 2020-02-23 DIAGNOSIS — N186 End stage renal disease: Secondary | ICD-10-CM | POA: Diagnosis not present

## 2020-02-23 DIAGNOSIS — N201 Calculus of ureter: Secondary | ICD-10-CM | POA: Diagnosis not present

## 2020-02-23 DIAGNOSIS — R0602 Shortness of breath: Secondary | ICD-10-CM | POA: Diagnosis not present

## 2020-02-23 DIAGNOSIS — I4430 Unspecified atrioventricular block: Secondary | ICD-10-CM | POA: Diagnosis not present

## 2020-02-23 DIAGNOSIS — I4891 Unspecified atrial fibrillation: Secondary | ICD-10-CM | POA: Diagnosis not present

## 2020-02-23 DIAGNOSIS — E875 Hyperkalemia: Secondary | ICD-10-CM | POA: Diagnosis not present

## 2020-02-23 DIAGNOSIS — I120 Hypertensive chronic kidney disease with stage 5 chronic kidney disease or end stage renal disease: Secondary | ICD-10-CM | POA: Diagnosis not present

## 2020-02-23 DIAGNOSIS — I5020 Unspecified systolic (congestive) heart failure: Secondary | ICD-10-CM | POA: Diagnosis not present

## 2020-02-23 DIAGNOSIS — R0902 Hypoxemia: Secondary | ICD-10-CM | POA: Diagnosis not present

## 2020-02-23 DIAGNOSIS — J1282 Pneumonia due to coronavirus disease 2019: Secondary | ICD-10-CM | POA: Diagnosis not present

## 2020-02-24 DIAGNOSIS — K922 Gastrointestinal hemorrhage, unspecified: Secondary | ICD-10-CM | POA: Insufficient documentation

## 2020-02-24 DIAGNOSIS — U071 COVID-19: Secondary | ICD-10-CM | POA: Insufficient documentation

## 2020-02-24 DIAGNOSIS — J9600 Acute respiratory failure, unspecified whether with hypoxia or hypercapnia: Secondary | ICD-10-CM | POA: Insufficient documentation

## 2020-02-28 DIAGNOSIS — R1013 Epigastric pain: Secondary | ICD-10-CM | POA: Diagnosis not present

## 2020-02-29 DIAGNOSIS — N201 Calculus of ureter: Secondary | ICD-10-CM | POA: Diagnosis not present

## 2020-03-03 DIAGNOSIS — Z79899 Other long term (current) drug therapy: Secondary | ICD-10-CM | POA: Diagnosis not present

## 2020-03-03 DIAGNOSIS — U071 COVID-19: Secondary | ICD-10-CM | POA: Diagnosis not present

## 2020-03-03 DIAGNOSIS — I120 Hypertensive chronic kidney disease with stage 5 chronic kidney disease or end stage renal disease: Secondary | ICD-10-CM | POA: Diagnosis not present

## 2020-03-03 DIAGNOSIS — Z992 Dependence on renal dialysis: Secondary | ICD-10-CM | POA: Diagnosis not present

## 2020-03-03 DIAGNOSIS — N186 End stage renal disease: Secondary | ICD-10-CM | POA: Diagnosis not present

## 2020-03-03 DIAGNOSIS — Z7982 Long term (current) use of aspirin: Secondary | ICD-10-CM | POA: Diagnosis not present

## 2020-03-03 DIAGNOSIS — R000 Tachycardia, unspecified: Secondary | ICD-10-CM | POA: Diagnosis not present

## 2020-03-04 DIAGNOSIS — N2581 Secondary hyperparathyroidism of renal origin: Secondary | ICD-10-CM | POA: Diagnosis not present

## 2020-03-04 DIAGNOSIS — Z992 Dependence on renal dialysis: Secondary | ICD-10-CM | POA: Diagnosis not present

## 2020-03-04 DIAGNOSIS — N186 End stage renal disease: Secondary | ICD-10-CM | POA: Diagnosis not present

## 2020-03-06 DIAGNOSIS — N186 End stage renal disease: Secondary | ICD-10-CM | POA: Diagnosis not present

## 2020-03-06 DIAGNOSIS — Z992 Dependence on renal dialysis: Secondary | ICD-10-CM | POA: Diagnosis not present

## 2020-03-06 DIAGNOSIS — N2581 Secondary hyperparathyroidism of renal origin: Secondary | ICD-10-CM | POA: Diagnosis not present

## 2020-03-07 DIAGNOSIS — Z992 Dependence on renal dialysis: Secondary | ICD-10-CM | POA: Diagnosis not present

## 2020-03-07 DIAGNOSIS — N186 End stage renal disease: Secondary | ICD-10-CM | POA: Diagnosis not present

## 2020-03-07 DIAGNOSIS — N2581 Secondary hyperparathyroidism of renal origin: Secondary | ICD-10-CM | POA: Diagnosis not present

## 2020-03-09 DIAGNOSIS — Z992 Dependence on renal dialysis: Secondary | ICD-10-CM | POA: Diagnosis not present

## 2020-03-09 DIAGNOSIS — N2581 Secondary hyperparathyroidism of renal origin: Secondary | ICD-10-CM | POA: Diagnosis not present

## 2020-03-09 DIAGNOSIS — N186 End stage renal disease: Secondary | ICD-10-CM | POA: Diagnosis not present

## 2020-03-11 DIAGNOSIS — N186 End stage renal disease: Secondary | ICD-10-CM | POA: Diagnosis not present

## 2020-03-11 DIAGNOSIS — N2581 Secondary hyperparathyroidism of renal origin: Secondary | ICD-10-CM | POA: Diagnosis not present

## 2020-03-11 DIAGNOSIS — Z992 Dependence on renal dialysis: Secondary | ICD-10-CM | POA: Diagnosis not present

## 2020-03-11 DIAGNOSIS — U071 COVID-19: Secondary | ICD-10-CM | POA: Diagnosis not present

## 2020-03-13 DIAGNOSIS — N186 End stage renal disease: Secondary | ICD-10-CM | POA: Diagnosis not present

## 2020-03-13 DIAGNOSIS — N2581 Secondary hyperparathyroidism of renal origin: Secondary | ICD-10-CM | POA: Diagnosis not present

## 2020-03-13 DIAGNOSIS — Z992 Dependence on renal dialysis: Secondary | ICD-10-CM | POA: Diagnosis not present

## 2020-03-15 DIAGNOSIS — N186 End stage renal disease: Secondary | ICD-10-CM | POA: Diagnosis not present

## 2020-03-15 DIAGNOSIS — I10 Essential (primary) hypertension: Secondary | ICD-10-CM | POA: Diagnosis not present

## 2020-03-15 DIAGNOSIS — Z992 Dependence on renal dialysis: Secondary | ICD-10-CM | POA: Diagnosis not present

## 2020-03-16 DIAGNOSIS — N2581 Secondary hyperparathyroidism of renal origin: Secondary | ICD-10-CM | POA: Diagnosis not present

## 2020-03-16 DIAGNOSIS — N186 End stage renal disease: Secondary | ICD-10-CM | POA: Diagnosis not present

## 2020-03-16 DIAGNOSIS — Z992 Dependence on renal dialysis: Secondary | ICD-10-CM | POA: Diagnosis not present

## 2020-03-18 DIAGNOSIS — N2581 Secondary hyperparathyroidism of renal origin: Secondary | ICD-10-CM | POA: Diagnosis not present

## 2020-03-18 DIAGNOSIS — Z992 Dependence on renal dialysis: Secondary | ICD-10-CM | POA: Diagnosis not present

## 2020-03-18 DIAGNOSIS — N186 End stage renal disease: Secondary | ICD-10-CM | POA: Diagnosis not present

## 2020-03-20 ENCOUNTER — Other Ambulatory Visit: Payer: Self-pay | Admitting: Student

## 2020-03-20 DIAGNOSIS — N186 End stage renal disease: Secondary | ICD-10-CM | POA: Diagnosis not present

## 2020-03-20 DIAGNOSIS — N2581 Secondary hyperparathyroidism of renal origin: Secondary | ICD-10-CM | POA: Diagnosis not present

## 2020-03-20 DIAGNOSIS — Z992 Dependence on renal dialysis: Secondary | ICD-10-CM | POA: Diagnosis not present

## 2020-03-23 DIAGNOSIS — I4891 Unspecified atrial fibrillation: Secondary | ICD-10-CM | POA: Diagnosis not present

## 2020-03-23 DIAGNOSIS — N2581 Secondary hyperparathyroidism of renal origin: Secondary | ICD-10-CM | POA: Diagnosis not present

## 2020-03-23 DIAGNOSIS — R0600 Dyspnea, unspecified: Secondary | ICD-10-CM | POA: Diagnosis not present

## 2020-03-23 DIAGNOSIS — N186 End stage renal disease: Secondary | ICD-10-CM | POA: Diagnosis not present

## 2020-03-23 DIAGNOSIS — Z992 Dependence on renal dialysis: Secondary | ICD-10-CM | POA: Diagnosis not present

## 2020-03-23 DIAGNOSIS — R1084 Generalized abdominal pain: Secondary | ICD-10-CM | POA: Diagnosis not present

## 2020-03-25 ENCOUNTER — Encounter (HOSPITAL_COMMUNITY): Payer: Self-pay

## 2020-03-25 ENCOUNTER — Emergency Department (HOSPITAL_COMMUNITY): Payer: Medicare HMO

## 2020-03-25 ENCOUNTER — Inpatient Hospital Stay (HOSPITAL_COMMUNITY)
Admission: EM | Admit: 2020-03-25 | Discharge: 2020-04-01 | DRG: 308 | Disposition: A | Discharge status: Home / Self Care | Payer: Medicare HMO | Attending: Family Medicine | Admitting: Family Medicine

## 2020-03-25 ENCOUNTER — Other Ambulatory Visit: Payer: Self-pay

## 2020-03-25 DIAGNOSIS — Z720 Tobacco use: Secondary | ICD-10-CM | POA: Diagnosis not present

## 2020-03-25 DIAGNOSIS — I313 Pericardial effusion (noninflammatory): Secondary | ICD-10-CM | POA: Diagnosis present

## 2020-03-25 DIAGNOSIS — R0602 Shortness of breath: Secondary | ICD-10-CM

## 2020-03-25 DIAGNOSIS — I959 Hypotension, unspecified: Secondary | ICD-10-CM | POA: Diagnosis not present

## 2020-03-25 DIAGNOSIS — I050 Rheumatic mitral stenosis: Secondary | ICD-10-CM | POA: Diagnosis not present

## 2020-03-25 DIAGNOSIS — Z7982 Long term (current) use of aspirin: Secondary | ICD-10-CM

## 2020-03-25 DIAGNOSIS — F419 Anxiety disorder, unspecified: Secondary | ICD-10-CM | POA: Diagnosis present

## 2020-03-25 DIAGNOSIS — Z9115 Patient's noncompliance with renal dialysis: Secondary | ICD-10-CM

## 2020-03-25 DIAGNOSIS — D631 Anemia in chronic kidney disease: Secondary | ICD-10-CM | POA: Diagnosis present

## 2020-03-25 DIAGNOSIS — H5350 Unspecified color vision deficiencies: Secondary | ICD-10-CM | POA: Diagnosis present

## 2020-03-25 DIAGNOSIS — Z7901 Long term (current) use of anticoagulants: Secondary | ICD-10-CM | POA: Diagnosis not present

## 2020-03-25 DIAGNOSIS — Z79899 Other long term (current) drug therapy: Secondary | ICD-10-CM

## 2020-03-25 DIAGNOSIS — I342 Nonrheumatic mitral (valve) stenosis: Secondary | ICD-10-CM | POA: Diagnosis present

## 2020-03-25 DIAGNOSIS — J440 Chronic obstructive pulmonary disease with acute lower respiratory infection: Secondary | ICD-10-CM | POA: Diagnosis not present

## 2020-03-25 DIAGNOSIS — I5043 Acute on chronic combined systolic (congestive) and diastolic (congestive) heart failure: Secondary | ICD-10-CM | POA: Diagnosis present

## 2020-03-25 DIAGNOSIS — J189 Pneumonia, unspecified organism: Secondary | ICD-10-CM | POA: Diagnosis not present

## 2020-03-25 DIAGNOSIS — I5032 Chronic diastolic (congestive) heart failure: Secondary | ICD-10-CM | POA: Diagnosis not present

## 2020-03-25 DIAGNOSIS — E871 Hypo-osmolality and hyponatremia: Secondary | ICD-10-CM | POA: Diagnosis not present

## 2020-03-25 DIAGNOSIS — N2581 Secondary hyperparathyroidism of renal origin: Secondary | ICD-10-CM | POA: Diagnosis present

## 2020-03-25 DIAGNOSIS — I480 Paroxysmal atrial fibrillation: Principal | ICD-10-CM | POA: Diagnosis present

## 2020-03-25 DIAGNOSIS — R778 Other specified abnormalities of plasma proteins: Secondary | ICD-10-CM | POA: Diagnosis present

## 2020-03-25 DIAGNOSIS — F17210 Nicotine dependence, cigarettes, uncomplicated: Secondary | ICD-10-CM | POA: Diagnosis present

## 2020-03-25 DIAGNOSIS — G40909 Epilepsy, unspecified, not intractable, without status epilepticus: Secondary | ICD-10-CM | POA: Diagnosis present

## 2020-03-25 DIAGNOSIS — Z5181 Encounter for therapeutic drug level monitoring: Secondary | ICD-10-CM | POA: Diagnosis not present

## 2020-03-25 DIAGNOSIS — R0902 Hypoxemia: Secondary | ICD-10-CM | POA: Diagnosis not present

## 2020-03-25 DIAGNOSIS — I4891 Unspecified atrial fibrillation: Secondary | ICD-10-CM | POA: Diagnosis present

## 2020-03-25 DIAGNOSIS — Z9114 Patient's other noncompliance with medication regimen: Secondary | ICD-10-CM

## 2020-03-25 DIAGNOSIS — I4892 Unspecified atrial flutter: Secondary | ICD-10-CM

## 2020-03-25 DIAGNOSIS — K219 Gastro-esophageal reflux disease without esophagitis: Secondary | ICD-10-CM | POA: Diagnosis present

## 2020-03-25 DIAGNOSIS — Z20822 Contact with and (suspected) exposure to covid-19: Secondary | ICD-10-CM | POA: Diagnosis present

## 2020-03-25 DIAGNOSIS — E875 Hyperkalemia: Secondary | ICD-10-CM | POA: Diagnosis not present

## 2020-03-25 DIAGNOSIS — E8889 Other specified metabolic disorders: Secondary | ICD-10-CM | POA: Diagnosis present

## 2020-03-25 DIAGNOSIS — R739 Hyperglycemia, unspecified: Secondary | ICD-10-CM | POA: Diagnosis present

## 2020-03-25 DIAGNOSIS — J90 Pleural effusion, not elsewhere classified: Secondary | ICD-10-CM | POA: Diagnosis not present

## 2020-03-25 DIAGNOSIS — I509 Heart failure, unspecified: Secondary | ICD-10-CM | POA: Diagnosis not present

## 2020-03-25 DIAGNOSIS — E43 Unspecified severe protein-calorie malnutrition: Secondary | ICD-10-CM | POA: Diagnosis not present

## 2020-03-25 DIAGNOSIS — I132 Hypertensive heart and chronic kidney disease with heart failure and with stage 5 chronic kidney disease, or end stage renal disease: Secondary | ICD-10-CM | POA: Diagnosis not present

## 2020-03-25 DIAGNOSIS — I272 Pulmonary hypertension, unspecified: Secondary | ICD-10-CM | POA: Diagnosis present

## 2020-03-25 DIAGNOSIS — Z792 Long term (current) use of antibiotics: Secondary | ICD-10-CM

## 2020-03-25 DIAGNOSIS — N186 End stage renal disease: Secondary | ICD-10-CM | POA: Diagnosis not present

## 2020-03-25 DIAGNOSIS — Z992 Dependence on renal dialysis: Secondary | ICD-10-CM

## 2020-03-25 DIAGNOSIS — I351 Nonrheumatic aortic (valve) insufficiency: Secondary | ICD-10-CM | POA: Diagnosis not present

## 2020-03-25 DIAGNOSIS — E8770 Fluid overload, unspecified: Secondary | ICD-10-CM | POA: Diagnosis not present

## 2020-03-25 DIAGNOSIS — R000 Tachycardia, unspecified: Secondary | ICD-10-CM | POA: Diagnosis not present

## 2020-03-25 DIAGNOSIS — J9601 Acute respiratory failure with hypoxia: Secondary | ICD-10-CM | POA: Diagnosis present

## 2020-03-25 DIAGNOSIS — I700 Atherosclerosis of aorta: Secondary | ICD-10-CM | POA: Diagnosis present

## 2020-03-25 DIAGNOSIS — R591 Generalized enlarged lymph nodes: Secondary | ICD-10-CM | POA: Diagnosis not present

## 2020-03-25 DIAGNOSIS — R079 Chest pain, unspecified: Secondary | ICD-10-CM | POA: Diagnosis not present

## 2020-03-25 DIAGNOSIS — Z5309 Procedure and treatment not carried out because of other contraindication: Secondary | ICD-10-CM | POA: Diagnosis not present

## 2020-03-25 DIAGNOSIS — N250 Renal osteodystrophy: Secondary | ICD-10-CM | POA: Diagnosis not present

## 2020-03-25 DIAGNOSIS — Z8616 Personal history of COVID-19: Secondary | ICD-10-CM

## 2020-03-25 DIAGNOSIS — I340 Nonrheumatic mitral (valve) insufficiency: Secondary | ICD-10-CM | POA: Diagnosis not present

## 2020-03-25 DIAGNOSIS — D649 Anemia, unspecified: Secondary | ICD-10-CM | POA: Diagnosis not present

## 2020-03-25 DIAGNOSIS — I5033 Acute on chronic diastolic (congestive) heart failure: Secondary | ICD-10-CM | POA: Diagnosis not present

## 2020-03-25 DIAGNOSIS — I361 Nonrheumatic tricuspid (valve) insufficiency: Secondary | ICD-10-CM | POA: Diagnosis not present

## 2020-03-25 DIAGNOSIS — J449 Chronic obstructive pulmonary disease, unspecified: Secondary | ICD-10-CM | POA: Diagnosis not present

## 2020-03-25 DIAGNOSIS — Z716 Tobacco abuse counseling: Secondary | ICD-10-CM

## 2020-03-25 DIAGNOSIS — R42 Dizziness and giddiness: Secondary | ICD-10-CM | POA: Diagnosis not present

## 2020-03-25 DIAGNOSIS — I484 Atypical atrial flutter: Secondary | ICD-10-CM | POA: Diagnosis not present

## 2020-03-25 LAB — COMPREHENSIVE METABOLIC PANEL
ALT: 26 U/L (ref 0–44)
AST: 21 U/L (ref 15–41)
Albumin: 2.8 g/dL — ABNORMAL LOW (ref 3.5–5.0)
Alkaline Phosphatase: 162 U/L — ABNORMAL HIGH (ref 38–126)
Anion gap: 18 — ABNORMAL HIGH (ref 5–15)
BUN: 80 mg/dL — ABNORMAL HIGH (ref 6–20)
CO2: 27 mmol/L (ref 22–32)
Calcium: 10.2 mg/dL (ref 8.9–10.3)
Chloride: 91 mmol/L — ABNORMAL LOW (ref 98–111)
Creatinine, Ser: 9.74 mg/dL — ABNORMAL HIGH (ref 0.61–1.24)
GFR, Estimated: 6 mL/min — ABNORMAL LOW (ref >60)
Glucose, Bld: 105 mg/dL — ABNORMAL HIGH (ref 70–99)
Potassium: 5.8 mmol/L — ABNORMAL HIGH (ref 3.5–5.1)
Sodium: 136 mmol/L (ref 135–145)
Total Bilirubin: 0.8 mg/dL (ref 0.3–1.2)
Total Protein: 6.9 g/dL (ref 6.5–8.1)

## 2020-03-25 LAB — CBC WITH DIFFERENTIAL/PLATELET
Abs Immature Granulocytes: 0.12 10*3/uL — ABNORMAL HIGH (ref 0.00–0.07)
Basophils Absolute: 0.1 10*3/uL (ref 0.0–0.1)
Basophils Relative: 1 %
Eosinophils Absolute: 0.1 10*3/uL (ref 0.0–0.5)
Eosinophils Relative: 1 %
HCT: 35.2 % — ABNORMAL LOW (ref 39.0–52.0)
Hemoglobin: 10.2 g/dL — ABNORMAL LOW (ref 13.0–17.0)
Immature Granulocytes: 2 %
Lymphocytes Relative: 15 %
Lymphs Abs: 0.8 10*3/uL (ref 0.7–4.0)
MCH: 27.9 pg (ref 26.0–34.0)
MCHC: 29 g/dL — ABNORMAL LOW (ref 30.0–36.0)
MCV: 96.2 fL (ref 80.0–100.0)
Monocytes Absolute: 0.3 10*3/uL (ref 0.1–1.0)
Monocytes Relative: 5 %
Neutro Abs: 4 10*3/uL (ref 1.7–7.7)
Neutrophils Relative %: 76 %
Platelets: 244 10*3/uL (ref 150–400)
RBC: 3.66 MIL/uL — ABNORMAL LOW (ref 4.22–5.81)
RDW: 21.8 % — ABNORMAL HIGH (ref 11.5–15.5)
WBC: 5.4 10*3/uL (ref 4.0–10.5)
nRBC: 0.6 % — ABNORMAL HIGH (ref 0.0–0.2)

## 2020-03-25 LAB — PROTIME-INR
INR: 1.2 (ref 0.8–1.2)
Prothrombin Time: 15 seconds (ref 11.4–15.2)

## 2020-03-25 LAB — LIPASE, BLOOD: Lipase: 24 U/L (ref 11–51)

## 2020-03-25 LAB — MAGNESIUM: Magnesium: 2.8 mg/dL — ABNORMAL HIGH (ref 1.7–2.4)

## 2020-03-25 LAB — TROPONIN I (HIGH SENSITIVITY)
Troponin I (High Sensitivity): 64 ng/L — ABNORMAL HIGH (ref <18)
Troponin I (High Sensitivity): 58 ng/L — ABNORMAL HIGH (ref <18)

## 2020-03-25 LAB — LACTIC ACID, PLASMA: Lactic Acid, Venous: 1.6 mmol/L (ref 0.5–1.9)

## 2020-03-25 MED ORDER — FAMOTIDINE 20 MG PO TABS: 20.0000 mg | ORAL_TABLET | Freq: Two times a day (BID) | ORAL | Status: DC | PRN

## 2020-03-25 MED ORDER — SEVELAMER CARBONATE 800 MG PO TABS
2400.0000 mg | ORAL_TABLET | Freq: Three times a day (TID) | ORAL | Status: DC
  Administered 2020-03-25 – 2020-04-01 (×16): 2400 mg via ORAL
  Filled 2020-03-25 (×18): qty 3

## 2020-03-25 MED ORDER — SEVELAMER CARBONATE 800 MG PO TABS
800.0000 mg | ORAL_TABLET | ORAL | Status: DC | PRN
  Filled 2020-03-25: qty 2

## 2020-03-25 MED ORDER — PANTOPRAZOLE SODIUM 40 MG PO TBEC
40.0000 mg | DELAYED_RELEASE_TABLET | Freq: Every day | ORAL | Status: DC
  Administered 2020-03-25 – 2020-04-01 (×8): 40 mg via ORAL
  Filled 2020-03-25 (×8): qty 1

## 2020-03-25 MED ORDER — DILTIAZEM LOAD VIA INFUSION
20.0000 mg | Freq: Once | INTRAVENOUS | Status: AC
  Administered 2020-03-25: 20 mg via INTRAVENOUS
  Filled 2020-03-25: qty 20

## 2020-03-25 MED ORDER — HEPARIN SODIUM (PORCINE) 5000 UNIT/ML IJ SOLN
5000.0000 [IU] | Freq: Three times a day (TID) | INTRAMUSCULAR | Status: DC
  Administered 2020-03-25 – 2020-03-26 (×2): 5000 [IU] via SUBCUTANEOUS
  Filled 2020-03-25 (×3): qty 1

## 2020-03-25 MED ORDER — SODIUM ZIRCONIUM CYCLOSILICATE 10 G PO PACK
10.0000 g | PACK | Freq: Once | ORAL | Status: AC
  Administered 2020-03-25: 10 g via ORAL
  Filled 2020-03-25: qty 1

## 2020-03-25 MED ORDER — ALBUTEROL SULFATE (2.5 MG/3ML) 0.083% IN NEBU
3.0000 mL | INHALATION_SOLUTION | Freq: Four times a day (QID) | RESPIRATORY_TRACT | Status: DC | PRN
  Administered 2020-04-01: 3 mL via RESPIRATORY_TRACT
  Filled 2020-03-25: qty 3

## 2020-03-25 MED ORDER — CEFDINIR 300 MG PO CAPS: 300.0000 mg | ORAL_CAPSULE | ORAL | Status: DC

## 2020-03-25 MED ORDER — METOPROLOL TARTRATE 5 MG/5ML IV SOLN
5.0000 mg | Freq: Once | INTRAVENOUS | Status: AC
  Administered 2020-03-25: 5 mg via INTRAVENOUS
  Filled 2020-03-25: qty 5

## 2020-03-25 MED ORDER — DILTIAZEM HCL 25 MG/5ML IV SOLN
15.0000 mg | Freq: Once | INTRAVENOUS | Status: AC
  Administered 2020-03-25: 15 mg via INTRAVENOUS
  Filled 2020-03-25: qty 5

## 2020-03-25 MED ORDER — DILTIAZEM HCL 25 MG/5ML IV SOLN
20.0000 mg | Freq: Once | INTRAVENOUS | Status: AC
  Administered 2020-03-25: 20 mg via INTRAVENOUS
  Filled 2020-03-25: qty 5

## 2020-03-25 MED ORDER — DILTIAZEM HCL-DEXTROSE 125-5 MG/125ML-% IV SOLN (PREMIX)
5.0000 mg/h | INTRAVENOUS | Status: DC
  Administered 2020-03-25: 15 mg/h via INTRAVENOUS
  Administered 2020-03-25: 5 mg/h via INTRAVENOUS
  Administered 2020-03-26: 10 mg/h via INTRAVENOUS
  Administered 2020-03-26: 15 mg/h via INTRAVENOUS
  Filled 2020-03-25 (×6): qty 125

## 2020-03-25 MED ORDER — ACETAMINOPHEN 500 MG PO TABS
500.0000 mg | ORAL_TABLET | Freq: Three times a day (TID) | ORAL | Status: DC | PRN
  Administered 2020-03-26 – 2020-03-31 (×4): 500 mg via ORAL
  Filled 2020-03-25 (×5): qty 1

## 2020-03-25 MED ORDER — DILTIAZEM HCL ER COATED BEADS 240 MG PO CP24
240.0000 mg | ORAL_CAPSULE | Freq: Once | ORAL | Status: AC
  Administered 2020-03-25: 240 mg via ORAL
  Filled 2020-03-25: qty 1

## 2020-03-25 MED ORDER — ASPIRIN 81 MG PO CHEW
81.0000 mg | CHEWABLE_TABLET | Freq: Every day | ORAL | Status: DC
  Administered 2020-03-25 – 2020-03-30 (×6): 81 mg via ORAL
  Filled 2020-03-25 (×6): qty 1

## 2020-03-25 MED ORDER — NICOTINE 14 MG/24HR TD PT24
14.0000 mg | MEDICATED_PATCH | Freq: Every day | TRANSDERMAL | Status: DC
  Administered 2020-03-25 – 2020-03-30 (×6): 14 mg via TRANSDERMAL
  Filled 2020-03-25 (×8): qty 1

## 2020-03-25 MED ORDER — HYDROXYZINE HCL 25 MG PO TABS
25.0000 mg | ORAL_TABLET | Freq: Three times a day (TID) | ORAL | Status: DC | PRN
  Administered 2020-03-25 – 2020-03-31 (×5): 25 mg via ORAL
  Filled 2020-03-25 (×5): qty 1

## 2020-03-25 MED ORDER — DILTIAZEM LOAD VIA INFUSION
10.0000 mg | Freq: Once | INTRAVENOUS | Status: AC
  Administered 2020-03-25: 10 mg via INTRAVENOUS
  Filled 2020-03-25: qty 10

## 2020-03-25 NOTE — ED Triage Notes (Signed)
Pt BIB EMS from dialysis center due to sob. Per EMS patient is in a-flutter.Pt represents with sob,cp. Pt has dialysis on m,w,f. Pt didn't receive treatment at all today.

## 2020-03-25 NOTE — H&P (Addendum)
[facility] Admission History and Physical Service Pager: [phone]  Patient name: Jeremy Mccoy Medical record number: [id number] Date of birth: 03/09/1966 Age: 54 y.o. Gender: male  Primary Care Provider: Patient, No Pcp Per Consultants: Nephro, Cardiology Code Status: FULL Preferred Emergency Contact: [name] (sister)  Chief Complaint: chest pain, SOB  Assessment and Plan: Jeremy Mccoy is a 54 y.o. male presenting with SOB and dizziness. PMH is significant for ESRD on HD MWF, a-fib with RVR, HTN, COPD, CHF, tobacco use, prior cocaine abuse, recent COVID, and medication noncompliance.   A-Fib with RVR Patient presents with 1.5 weeks of dizziness and shortness of breath. He was sent in from dialysis today due to his symptoms and elevated heart rate. In the ED, patient found to be in a-fib with RVR. He was given two IV pushes of diltiazem without improvement in HR, so he was then started on a diltiazem drip. HR remains in the 130s currently.  EKG shows sinus tachycardia vs. afib with RVR.  Of note, patient was admitted in September for a-fib with RVR, also requiring a diltiazem drip at that time. He was prescribed diltiazem on discharge, but reportedly never picked it up from the pharmacy. Patient is not on anticoagulation at home due to hx of anemia requiring transfusions. CHADS2VASC score: 2. -admit to [place], attending Dr. Ardelia Mems -OOB with assistance only, renal diet -Med-tele, vitals per routine -Cardiology following, appreciate recommendations -Initial EKG is not clearly atrial fibrillation and may be sinus tachycardia.  Cardiology giving 1 dose of IV metoprolol to slow heart rate for better assessment of the rhythm -If in atrial fibrillation consider starting anticoagulation with Eliquis -Cardiology rebolusing with diltiazem 10 mg and continuing infusion at current rate -PT/OT eval and treat  ESRD on HD MWF  Hyperkalemia Patient did not  receive dialysis today, as they sent him to the ED prior to treatment. Has crackles on lung exam but otherwise does not appear significantly volume overloaded. K 5.8 on presentation, Cr 9.74. Last dialysis Monday 03/23/2020. -Nephro consulted, appreciate recommendations -Plan for [facility] x1 and dialysis tomorrow morning -Daily renal function panel -Continue renvela per nephro  Elevated Troponin Troponin 64 on presentation, trended to 58. Likely due to demand ischemia in the setting of a-fib with RVR.  HTN Patient with hx of HTN, on amlodipine 10mg  daily at home. BP has been slightly elevated in 147W-295A systolic, but 21H-086V diastolic since arrival. -holding home amlodipine for now -continue to monitor BP -appreciate cardiology recommendations  COPD  Tobacco use disorder Patient's SOB more likely related to volume overload and a-fib with RVR. No increased cough or sputum production. Do not suspect COPD exacerbation at this time. Patient only on albuterol inhaler at home. -albuterol neb q6h prn -Nicotine patch 15mg  daily  Anemia Hgb 10.2 with MCV 96.2 on admission. Patient has a history of anemia requiring blood transfusions. Per chart review has refused endoscopy and colonoscopy in the past. Was admitted to [facility] with ?GI bleed in October 2021 but left AMA. -Daily CBC -Monitor for hematochezia/melena  CHF Chronic, stable. Echo 01/2020 showed EF 50-55%. -HD tomorrow per nephro   FEN/GI: Renal diet Prophylaxis: Heparin  Disposition: med-tele  History of Present Illness:  Jeremy Mccoy is a 54 y.o. male presenting with shortness of breath and dizziness. Patient states he's had dizziness and SOB for the past 1.5 weeks, ever since being discharged from [facility]. He denies chest pain currently. Patient has multiple recent hospitalizations for a-fib with  RVR and pneumonia. Was also hospitalized at [facility] last month with anemia and ?GI bleed but left AMA. States he was unable to pick  up his medications after discharge from the hospital.   Review Of Systems: Per HPI with the following additions:   Review of Systems  Constitutional: Positive for chills. Negative for fever.  Respiratory: Positive for shortness of breath. Negative for cough.   Cardiovascular: Negative for chest pain and leg swelling.  Gastrointestinal: Negative for abdominal pain, constipation, diarrhea, nausea and vomiting.  Skin: Negative for rash.  Neurological: Positive for dizziness.     Patient Active Problem List   Diagnosis Date Noted  . Hyperkalemia, diminished renal excretion 01/20/2020  . Pneumonia 01/20/2020  . ESRD (end stage renal disease) on dialysis ([place]) 10/20/2017  . Plantar fasciitis, bilateral 09/12/2017  . Hyperkalemia 02/23/2016  . Coughing blood   . Vasculitis ([place])   . Lung crackles   . Hemoptysis 07/25/2015  . Anemia 02/20/2014  . Symptomatic anemia 02/20/2014  . PAF (paroxysmal atrial fibrillation) ([place]) 01/31/2014  . Chronic diastolic congestive heart failure ([place]) 01/31/2014  . COPD (chronic obstructive pulmonary disease) ([place]) 01/31/2014  . Thrombocytopenia ([place]) 01/23/2014  . Unspecified constipation 01/23/2014  . High anion gap metabolic acidosis 78/67/6720  . ESRD on hemodialysis ([place]) 11/05/2013  . Seizure disorder ([place]) 11/04/2013  . Protein-calorie malnutrition, severe ([place]) 07/12/2013  . Respiratory failure ([place]) 07/10/2013  . Status epilepticus ([place]) 07/10/2013  . Essential hypertension 07/10/2013  . Acute blood loss anemia 05/15/2013  . Hematemesis 05/15/2013  . Volume excess 09/20/2012  . Abnormal EKG 09/20/2012  . Drug abuse ([place]) 09/20/2012  . Elevated troponin 09/20/2012  . Anemia in chronic kidney disease 09/20/2012  . Pulmonary edema 06/12/2012  . Cocaine abuse ([place]) 06/12/2012  . End stage renal disease ([place]) 01/11/2012  . Tobacco abuse 11/04/2011  . Dental caries 11/04/2011  . Color blindness 11/04/2011    Past Medical History: Past  Medical History:  Diagnosis Date  . Anemia   . Anxiety   . Arthritis    "qwhere" (05/15/2013)  . CHF (congestive heart failure) ([place])   . COPD (chronic obstructive pulmonary disease) ([place])   . Crack cocaine use   . Dental caries   . Depression   . ESRD (end stage renal disease) on dialysis [facility])    TTS: [address] [place] (05/15/2013)  . Headache(784.0)    "get it from going to dialysis; when they get real bad I come to hospital" (05/15/2013)  . Hematemesis/vomiting blood   . History of blood transfusion 10/2011; 04/2013  . HTN (hypertension) 06/12/2012  . Seizures ([place])   . Shortness of breath    "just when I have too much potassium is too high" (05/15/2013)    Past Surgical History: Past Surgical History:  Procedure Laterality Date  . AV FISTULA PLACEMENT  11/29/2011   Procedure: ARTERIOVENOUS (AV) FISTULA CREATION;  Surgeon: Angelia Mould, MD;  Location: [facility] OR;  Service: Vascular;  Laterality: Left;  . ESOPHAGOGASTRODUODENOSCOPY N/A 05/17/2013   Procedure: ESOPHAGOGASTRODUODENOSCOPY (EGD);  Surgeon: Beryle Beams, MD;  Location: [facility] ENDOSCOPY;  Service: Endoscopy;  Laterality: N/A;  . INGUINAL HERNIA REPAIR Bilateral 1967  . IR FLUORO GUIDE CV LINE RIGHT  10/20/2017  . IR THROMBECTOMY AV FISTULA W/THROMBOLYSIS/PTA INC/SHUNT/IMG LEFT Left 10/24/2017  . IR US GUIDE VASC ACCESS LEFT  10/24/2017  . IR US GUIDE VASC ACCESS RIGHT  10/20/2017  . RENAL BIOPSY  11/07/2011    Social History: Social History  Tobacco Use  . Smoking status: Former Smoker    Packs/day: 1.00    Years: 50.00    Pack years: 50.00    Types: Cigars, Cigarettes  . Smokeless tobacco: Never Used  Substance Use Topics  . Alcohol use: No    Comment: 05/15/2013 "aien't drank in ~ 25 yrs"  . Drug use: Yes    Types: Marijuana, Cocaine    Comment: Precription drugs (e.g. percocet) "I take what I have to to controll my constant pain" (05/15/2013)    Family History: Family History  Problem Relation Age  of Onset  . Heart attack Father     Allergies and Medications: No Known Allergies No current facility-administered medications on file prior to encounter.   Current Outpatient Medications on File Prior to Encounter  Medication Sig Dispense Refill  . acetaminophen (TYLENOL) 500 MG tablet Take 1 tablet (500 mg total) by mouth every 8 (eight) hours as needed for mild pain or fever. 6 times a day, 500 mg, 12 to 24 tabs a day, 6,000 to 12,000 mg daily for pain (Patient taking differently: Take 500-1,000 mg by mouth every 8 (eight) hours as needed for mild pain or fever. ) 30 tablet 0  . albuterol (VENTOLIN HFA) 108 (90 Base) MCG/ACT inhaler Inhale 3-6 puffs into the lungs every 6 (six) hours as needed for wheezing or shortness of breath.     Marland Kitchen amLODipine (NORVASC) 10 MG tablet Take 10 mg by mouth daily.    . B Complex-C-Zn-Folic Acid (DIALYVITE/ZINC) TABS Take 1 tablet by mouth daily.     Marland Kitchen bismuth subsalicylate (PEPTO BISMOL) 262 MG/15ML suspension Take 30 mLs by mouth every 6 (six) hours as needed for indigestion or diarrhea or loose stools.    . cloNIDine (CATAPRES) 0.3 MG tablet Take 0.3 mg by mouth 3 (three) times daily as needed (blood pressure).     . diphenhydrAMINE (BENADRYL) 25 mg capsule Take 25 mg by mouth every 6 (six) hours as needed for itching.    . famotidine (ZANTAC 360 MAX ST) 20 MG tablet Take 20 mg by mouth 2 (two) times daily as needed for heartburn or indigestion.    . hydrALAZINE (APRESOLINE) 25 MG tablet Take 25 mg by mouth 3 (three) times daily as needed (high blood pressure).     . hydrOXYzine (ATARAX/VISTARIL) 25 MG tablet Take 25 mg by mouth 3 (three) times daily as needed for itching.   6  . Nutritional Supplements (FEEDING SUPPLEMENT, NEPRO CARB STEADY,) LIQD Take 237 mLs by mouth 3 (three) times daily as needed (Supplement). (Patient taking differently: Take 237 mLs by mouth 3 (three) times a week. )  0  . pantoprazole (PROTONIX) 40 MG tablet Take 1 tablet (40 mg  total) by mouth daily. 30 tablet 0  . sevelamer carbonate (RENVELA) 800 MG tablet Take 2 tablets (1,600 mg total) by mouth 3 (three) times daily with meals. (Patient taking differently: Take 800-2,400 mg by mouth See admin instructions. Take 2,400 mg by mouth three times a day with meals and 800-1,600 mg with each snack) 90 tablet 0  . aspirin 81 MG chewable tablet Chew 1 tablet (81 mg total) by mouth daily. (Patient not taking: Reported on 01/20/2020) 30 tablet 11  . azithromycin (ZITHROMAX) 250 MG tablet Take 1 tablet in the morning (Patient not taking: Reported on 03/25/2020) 1 each 0  . cefdinir (OMNICEF) 300 MG capsule Take 1 capsule after dialysis today, and 1 after dialysis tomorrow (Patient not taking: Reported on 03/25/2020)  2 capsule 0  . diltiazem (CARTIA XT) 240 MG 24 hr capsule Take 1 capsule (240 mg total) by mouth daily. (Patient not taking: Reported on 03/25/2020) 30 capsule 0  . nicotine (NICODERM CQ - DOSED IN MG/24 HOURS) 14 mg/24hr patch Place 1 patch (14 mg total) onto the skin daily. (Patient not taking: Reported on 03/25/2020) 14 patch 0  . polyethylene glycol (MIRALAX / GLYCOLAX) packet Take 17 g by mouth daily as needed for mild constipation. (Patient not taking: Reported on 03/25/2020) 14 each 0    Objective: BP (!) 131/107   Pulse (!) 138   Temp 97.8 F (36.6 C) (Oral)   Resp (!) 22   SpO2 91%  Exam: General: alert, NAD Eyes: PERRL, EOMI ENTM: moist mucous membranes Neck: supple Cardiovascular: irregularly irregular rhythm Respiratory: normal WOB, crackles bilaterally at bases and mid lung zones Gastrointestinal: soft, nontender, nondistended MSK: no peripheral edema, 5/5 strength in bilateral upper and lower extremities Derm: no rashes appreciated Neuro: grossly intact Psych: patient frustrated with multiple recent illnesses and hospitalizations, appropriate affect, no SI  Labs and Imaging: CBC BMET  Recent Labs  Lab 03/25/20 0957  WBC 5.4  HGB 10.2*   HCT 35.2*  PLT 244   Recent Labs  Lab 03/25/20 0957  NA 136  K 5.8*  CL 91*  CO2 27  BUN 80*  CREATININE 9.74*  GLUCOSE 105*  CALCIUM 10.2     EKG: atrial fibrillation with HR 132   DG Chest Port 1 View Result Date: 03/25/2020 IMPRESSION: 1. Improved consolidation in the right upper lobe, compatible with improved pneumonia. Recommend continued imaging follow-up to resolution. 2. Increased diffuse interstitial prominence, which may represent interstitial pulmonary edema versus atypical infection. 3. Please see recent CT chest for characterization of conglomerate mediastinal and perihilar lymphadenopathy. Electronically Signed   By: Margaretha Sheffield MD   On: 03/25/2020 10:34     Alcus Dad, MD 03/25/2020, 2:47 PM PGY-1, [place] Intern pager: [phone], text pages welcome  FPTS Upper-Level Resident Addendum   I have independently interviewed and examined the patient. I have discussed the above with the original author and agree with their documentation. My edits for correction/addition/clarification are in blue.Please see also any attending notes.   Gifford Shave, MD PGY-2, [place] Medicine 03/25/2020 5:51 PM  [place] Service pager: [phone] (text pages welcome through [place])

## 2020-03-25 NOTE — Consult Note (Addendum)
Cardiology Consultation:   Patient ID: Jeremy Mccoy; [id number]; 09-29-1965   Admit date: 03/25/2020 Date of Consult: 03/25/2020  Primary Care Provider: Patient, No Pcp Per Primary Cardiologist: New to [facility]  Patient Profile:   Jeremy Mccoy is a 54 y.o. male with a hx of COPD with ongoing tobacco use, hx of cocaine use, HTN, ESRD on HD (MWF) and recent COVID/PNA infection who is being seen today for the evaluation of atrial fibrillation with RVR at the request of Dr. Rock Nephew.  History of Present Illness:   Jeremy Mccoy is a 54yo M with a hx as stated above who presented to [facility] with persistent SOB and intermittent dizziness found to be in AF with RVR and was placed on IV Diltiazem. K+ found to be elevated at 5.8>>nephrology consulted for inpatient HD. hST found to be 64 felt to be secondary to demand ischemia in the setting of elevated HR. Per nephrology note, pt is chronically noncompliant with HD and cuts all sessions short. He did not receive HD today. Recent echocardiogram 01/23/20 showed EF at 50-55% with no RWMA, mod PAH, severely dilated LA and mildly dilated RA, small pericardial effusion, mild MR and mod MS with severe mitral annular calcifications.   He was recently admitted for hyperkalemia found to have CAP/PNA as well as AF with RVR. He also had issues with anemia with a Hb at 7.6 therefore anticoagulation was deferred due to dropping Hb.   On my assessment today, he gives a very scattered recent hx with compiling symptoms that include dizziness after HD, SOB for months with two brief and fleeting episodes of chest pain which are not exertional, with no associated symptoms such as nausea, vomiting or diaphoresis. He denies LE edema or orthopnea symptoms. He continues to smoke and reports ongoing cocaine use.   He was remotely seen by our service in 2015 for the evaluation of HTN emergency and AF with RVR with CHF component. At that time, the patient had missed several day of  HD and on admission was hyperkalemic with acute pulmonary edema. He was placed on Nicardipine infusion and IV Hydralazine. He ruled out for MI. He was eventually transitioned to amlodipine and was discharged home on clonidine, carvedilol and losartan. He was started on ASA for AF given a CHA2DS2VASc score of 1. He was then seen in follow up by Dr. Radford Pax and was reported as doing very well. BP was well controlled and he had not missed any HD sessions. He was continued on beta blocker and ARB with plans to return in 6 months for follow up however it appears he has not been seen since this time.   Past Medical History:  Diagnosis Date  . Anemia   . Anxiety   . Arthritis    "qwhere" (05/15/2013)  . CHF (congestive heart failure) ([place])   . COPD (chronic obstructive pulmonary disease) ([place])   . Crack cocaine use   . Dental caries   . Depression   . ESRD (end stage renal disease) on dialysis [facility])    TTS: [address] [place] (05/15/2013)  . Headache(784.0)    "get it from going to dialysis; when they get real bad I come to hospital" (05/15/2013)  . Hematemesis/vomiting blood   . History of blood transfusion 10/2011; 04/2013  . HTN (hypertension) 06/12/2012  . Seizures ([place])   . Shortness of breath    "just when I have too much potassium is too high" (05/15/2013)    Past Surgical  History:  Procedure Laterality Date  . AV FISTULA PLACEMENT  11/29/2011   Procedure: ARTERIOVENOUS (AV) FISTULA CREATION;  Surgeon: Angelia Mould, MD;  Location[facility];  Service: Vascular;  Laterality: Left;  . ESOPHAGOGASTRODUODENOSCOPY N/A 05/17/2013   Procedure: ESOPHAGOGASTRODUODENOSCOPY (EGD);  Surgeon: Beryle Beams, MD;  Location: [facility] ENDOSCOPY;  Service: Endoscopy;  Laterality: N/A;  . INGUINAL HERNIA REPAIR Bilateral 1967  . IR FLUORO GUIDE CV LINE RIGHT  10/20/2017  . IR THROMBECTOMY AV FISTULA W/THROMBOLYSIS/PTA INC/SHUNT/IMG LEFT Left 10/24/2017  . IR US GUIDE VASC ACCESS LEFT  10/24/2017  . IR US  GUIDE VASC ACCESS RIGHT  10/20/2017  . RENAL BIOPSY  11/07/2011     Prior to Admission medications   Medication Sig Start Date End Date Taking? Authorizing Provider  acetaminophen (TYLENOL) 500 MG tablet Take 1 tablet (500 mg total) by mouth every 8 (eight) hours as needed for mild pain or fever. 6 times a day, 500 mg, 12 to 24 tabs a day, 6,000 to 12,000 mg daily for pain Patient taking differently: Take 500-1,000 mg by mouth every 8 (eight) hours as needed for mild pain or fever.  08/05/16  Yes Molt, Bethany, DO  albuterol (VENTOLIN HFA) 108 (90 Base) MCG/ACT inhaler Inhale 3-6 puffs into the lungs every 6 (six) hours as needed for wheezing or shortness of breath.    Yes [provider]  amLODipine (NORVASC) 10 MG tablet Take 10 mg by mouth daily. 03/06/20  Yes [provider]  B Complex-C-Zn-Folic Acid (DIALYVITE/ZINC) TABS Take 1 tablet by mouth daily.  09/11/19  Yes [provider]  bismuth subsalicylate (PEPTO BISMOL) 262 MG/15ML suspension Take 30 mLs by mouth every 6 (six) hours as needed for indigestion or diarrhea or loose stools.   Yes [provider]  cloNIDine (CATAPRES) 0.3 MG tablet Take 0.3 mg by mouth 3 (three) times daily as needed (blood pressure).    Yes [provider]  diphenhydrAMINE (BENADRYL) 25 mg capsule Take 25 mg by mouth every 6 (six) hours as needed for itching.   Yes [provider]  famotidine (ZANTAC 360 MAX ST) 20 MG tablet Take 20 mg by mouth 2 (two) times daily as needed for heartburn or indigestion.   Yes [provider]  hydrALAZINE (APRESOLINE) 25 MG tablet Take 25 mg by mouth 3 (three) times daily as needed (high blood pressure).  07/20/19  Yes [provider]  hydrOXYzine (ATARAX/VISTARIL) 25 MG tablet Take 25 mg by mouth 3 (three) times daily as needed for itching.  06/10/15  Yes [provider]  Nutritional Supplements (FEEDING SUPPLEMENT, NEPRO CARB STEADY,) LIQD Take 237 mLs by  mouth 3 (three) times daily as needed (Supplement). Patient taking differently: Take 237 mLs by mouth 3 (three) times a week.  05/17/13  Yes Mikhail, Maryann, DO  pantoprazole (PROTONIX) 40 MG tablet Take 1 tablet (40 mg total) by mouth daily. 01/23/20  Yes Andrew Au, MD  sevelamer carbonate (RENVELA) 800 MG tablet Take 2 tablets (1,600 mg total) by mouth 3 (three) times daily with meals. Patient taking differently: Take 800-2,400 mg by mouth See admin instructions. Take 2,400 mg by mouth three times a day with meals and 800-1,600 mg with each snack 01/25/14  Yes McLean-Scocuzza, Nino Glow, MD  aspirin 81 MG chewable tablet Chew 1 tablet (81 mg total) by mouth daily. Patient not taking: Reported on 01/20/2020 01/25/14   McLean-Scocuzza, Nino Glow, MD  azithromycin (ZITHROMAX) 250 MG tablet Take 1 tablet in  the morning Patient not taking: Reported on 03/25/2020 01/23/20   Andrew Au, MD  cefdinir (OMNICEF) 300 MG capsule Take 1 capsule after dialysis today, and 1 after dialysis tomorrow Patient not taking: Reported on 03/25/2020 01/23/20   Andrew Au, MD  diltiazem (CARTIA XT) 240 MG 24 hr capsule Take 1 capsule (240 mg total) by mouth daily. Patient not taking: Reported on 03/25/2020 01/23/20 01/22/21  Andrew Au, MD  nicotine (NICODERM CQ - DOSED IN MG/24 HOURS) 14 mg/24hr patch Place 1 patch (14 mg total) onto the skin daily. Patient not taking: Reported on 03/25/2020 01/24/20   Andrew Au, MD  polyethylene glycol [facility] / Floria Raveling) packet Take 17 g by mouth daily as needed for mild constipation. Patient not taking: Reported on 03/25/2020 08/05/16   Molt, Romelle Starcher, DO    Inpatient Medications: Scheduled Meds:  Continuous Infusions: . diltiazem (CARDIZEM) infusion 15 mg/hr (03/25/20 1456)   PRN Meds:   Allergies:   No Known Allergies  Social History:   Social History   Socioeconomic History  . Marital status: Single    Spouse name: Not on file  . Number of children: 0  . Years  of education: 9 th  . Highest education level: Not on file  Occupational History  . Occupation: Corporate treasurer    Comment: Disabled  Tobacco Use  . Smoking status: Former Smoker    Packs/day: 1.00    Years: 50.00    Pack years: 50.00    Types: Cigars, Cigarettes  . Smokeless tobacco: Never Used  Substance and Sexual Activity  . Alcohol use: No    Comment: 05/15/2013 "aien't drank in ~ 25 yrs"  . Drug use: Yes    Types: Marijuana, Cocaine    Comment: Precription drugs (e.g. percocet) "I take what I have to to controll my constant pain" (05/15/2013)  . Sexual activity: Never  Other Topics Concern  . Not on file  Social History Narrative   ** Merged History Encounter **   Patient lives at home with his parents and he is disabled.   Education 9th grade education   Right handed   Caffeine one cup daily       Social Determinants of Health   Financial Resource Strain:   . Difficulty of Paying Living Expenses: Not on file  Food Insecurity:   . Worried About Charity fundraiser in the Last Year: Not on file  . Ran Out of Food in the Last Year: Not on file  Transportation Needs:   . Lack of Transportation (Medical): Not on file  . Lack of Transportation (Non-Medical): Not on file  Physical Activity:   . Days of Exercise per Week: Not on file  . Minutes of Exercise per Session: Not on file  Stress:   . Feeling of Stress : Not on file  Social Connections:   . Frequency of Communication with Friends and Family: Not on file  . Frequency of Social Gatherings with Friends and Family: Not on file  . Attends Religious Services: Not on file  . Active Member of Clubs or Organizations: Not on file  . Attends Archivist Meetings: Not on file  . Marital Status: Not on file  Intimate Partner Violence:   . Fear of Current or Ex-Partner: Not on file  . Emotionally Abused: Not on file  . Physically Abused: Not on file  . Sexually Abused: Not on file    Family History:     Family  History  Problem Relation Age of Onset  . Heart attack Father    Family Status:  Family Status  Relation Name Status  . Mother  Alive  . Father  Deceased  . Sister  Alive  . Brother  Alive    ROS:  Please see the history of present illness.  All other ROS reviewed and negative.     Physical Exam/Data:   Vitals:   03/25/20 1345 03/25/20 1400 03/25/20 1415 03/25/20 1430  BP: (!) 130/113 (!) 122/95 (!) 135/115 (!) 131/107  Pulse:  (!) 138 (!) 137 (!) 138  Resp: (!) 23 (!) 28 14 (!) 22  Temp:      TempSrc:      SpO2:  96% 93% 91%   No intake or output data in the 24 hours ending 03/25/20 1537 There were no vitals filed for this visit. There is no height or weight on file to calculate BMI.   General: Appears older than stated age, NAD Skin: Warm, dry, intact  Neck: Negative for carotid bruits. No JVD Lungs: Right lower lobe crackles. No wheezes. Breathing is mildly labored. Cardiovascular: RRR with S1 S2. No murmurs Abdomen: Soft, non-tender, non-distended. No obvious abdominal masses. Extremities: No edema. Radial pulses 2+ bilaterally Neuro: Alert and oriented. No focal deficits. No facial asymmetry. MAE spontaneously. Psych: Responds to questions appropriately with normal affect.     EKG:  The EKG was personally reviewed and demonstrates: 03/25/20 ST with HR 138 Telemetry:  Telemetry was personally reviewed and demonstrates: 03/25/20 atrial flutter? Vs ST with HR in the 130's   Relevant CV Studies:  Echocardiogram 01/23/20:  1. Left ventricular ejection fraction, by estimation, is 50 to 55%. The  left ventricle has low normal function. The left ventricle has no regional  wall motion abnormalities. There is mild left ventricular hypertrophy.  Left ventricular diastolic function  could not be evaluated.  2. Right ventricular systolic function is normal. The right ventricular  size is normal. There is moderately elevated pulmonary artery systolic   pressure.  3. Left atrial size was severely dilated.  4. Right atrial size was mildly dilated.  5. A small pericardial effusion is present.  6. The mitral valve is abnormal. Mild mitral valve regurgitation.  Moderate mitral stenosis. Severe mitral annular calcification.  7. The aortic valve is tricuspid. There is mild calcification of the  aortic valve. There is mild thickening of the aortic valve. Aortic valve  regurgitation is trivial. Mild aortic valve sclerosis is present, with no  evidence of aortic valve stenosis.  8. The inferior vena cava is dilated in size with <50% respiratory  variability, suggesting right atrial pressure of 15 mmHg.   Comparison(s): Changes from prior study are noted.   Conclusion(s)/Recommendation(s): Severely calcified mitral valve with at  least moderate mitral stenosis. Highly variable heart rate between 90-120  bpm.   Laboratory Data:  Chemistry Recent Labs  Lab 03/25/20 0957  NA 136  K 5.8*  CL 91*  CO2 27  GLUCOSE 105*  BUN 80*  CREATININE 9.74*  CALCIUM 10.2  GFRNONAA 6*  ANIONGAP 18*    Total Protein  Date Value Ref Range Status  03/25/2020 6.9 6.5 - 8.1 g/dL Final  07/29/2013 6.5 6.0 - 8.5 g/dL Final   Albumin  Date Value Ref Range Status  03/25/2020 2.8 (L) 3.5 - 5.0 g/dL Final  07/29/2013 4.1 3.5 - 5.5 g/dL Final   AST  Date Value Ref Range Status  03/25/2020 21 15 - 41  U/L Final   ALT  Date Value Ref Range Status  03/25/2020 26 0 - 44 U/L Final   Alkaline Phosphatase  Date Value Ref Range Status  03/25/2020 162 (H) 38 - 126 U/L Final   Total Bilirubin  Date Value Ref Range Status  03/25/2020 0.8 0.3 - 1.2 mg/dL Final   Hematology Recent Labs  Lab 03/25/20 0957  WBC 5.4  RBC 3.66*  HGB 10.2*  HCT 35.2*  MCV 96.2  MCH 27.9  MCHC 29.0*  RDW 21.8*  PLT 244   Cardiac EnzymesNo results for input(s): TROPONINI in the last 168 hours. No results for input(s): TROPIPOC in the last 168 hours.  BNPNo  results for input(s): BNP, PROBNP in the last 168 hours.  DDimer No results for input(s): DDIMER in the last 168 hours. TSH:  Lab Results  Component Value Date   TSH 2.890 01/25/2014   Lipids:No results found for: CHOL, HDL, LDLCALC, LDLDIRECT, TRIG, CHOLHDL HgbA1c: Lab Results  Component Value Date   HGBA1C 5.4 11/04/2011    Radiology/Studies:  [facility] Chest Port 1 View  Result Date: 03/25/2020 CLINICAL DATA:  Chest pain, shortness of breath. EXAM: PORTABLE CHEST 1 VIEW COMPARISON:  01/20/2020. FINDINGS: Improved consolidation in the right upper lobe, compatible with improved pneumonia. Increased diffuse interstitial prominence, which may represent interstitial pulmonary edema versus atypical infection. Similar cardiomediastinal silhouette. Please see recent CT chest for characterization of conglomerate mediastinal and perihilar lymphadenopathy. No visible pleural effusions or pneumothorax. IMPRESSION: 1. Improved consolidation in the right upper lobe, compatible with improved pneumonia. Recommend continued imaging follow-up to resolution. 2. Increased diffuse interstitial prominence, which may represent interstitial pulmonary edema versus atypical infection. 3. Please see recent CT chest for characterization of conglomerate mediastinal and perihilar lymphadenopathy. Electronically Signed   By: Margaretha Sheffield MD   On: 03/25/2020 10:34   Assessment and Plan:   1. Atrial fibrillation with RVR: -He was remotely seen by our service in 2015 for the evaluation of HTN emergency and AF with RVR with CHF component. At that time, the patient had missed several day of HD and on admission was hyperkalemic with acute pulmonary edema. He ruled out for MI. He was eventually transitioned to amlodipine and was discharged home on clonidine, carvedilol and losartan. He was started on ASA for AF given a CHA2DS2VASc score of 1. He recently had a hospital admission 01/2020 for hyperkalemia and was found to have CAP  PNA and AF with RVR. CHA2DS2VASc has increased to 2 however AC was deferred in the setting of anemia with a Hb at 7.6.  -He then presented to [facility] today with SOB found to be in AF witth RVR and was placed on IV Diltiazem with no HR response -EKG appears to be ST versus atrial flutter>>will discuss with MD -Last echocardiogram 01/2020 with EF at 50-55% with no RWMA, mod PAH, severely dilated LA and mildly dilated RA, small pericardial effusion, mild MR and mod MS with severe mitral annular calcifications.  -CXR with interstitial prominence, which may represent interstitial pulmonary edema versus atypical infection>>improved PNA (recent PNA) -Plan for HD tomorrow for fluid volume management  -Will give one time dose IV metoprolol to slow HR for better assessment of rhythm -Hb stable therefore if AF>>>can start [facility] with Eliquis?  -Re-bolus with Diltiazem 10mg >>continue infusion at current rate   2. Mod mitral stenosis with severe annular calcifications: -Per echocardiogram 01/23/20 showed EF at 50-55% with no RWMA, mod PAH, severely dilated LA and mildly dilated RA, small  pericardial effusion, mild MR and mod MS with severe mitral annular calcifications.  -No progressive disease   3. Atypical chest pain: -Reports two brief non-exertional episodes of chest pain>>felt to be atypical -hsT at 64>>>58, not consistent with ACS -EKG with no acute changes  -Follow for now and continue ASA  4. ESRD: -On HD MWF although not very complaint per nephrology notation -Management per nephrology   5. Chronic CHF: -EF stable per echo 01/2020 at 50-55% -CXR with increased diffuse interstitial prominence, which may represent interstitial pulmonary edema versus atypical infection when compared to prior imaging.  -For HD tomorrow per nephrology   6. Ongoing tobacco and cocaine use: -Cessation encouraged    For questions or updates, please contact [place] Please consult [email or web address] for contact info  under Cardiology/STEMI.   Lyndel Safe NP-C [facility] Pager: [phone] 03/25/2020 3:37 PM    Patient seen and examined. Agree with assessment and plan.  Jeremy Mccoy is a elderly-appearing 54 year old gentleman who has a longstanding history of tobacco use, cocaine use for greater than 30 years, COPD, hypertension, and is on hemodialysis for end-stage renal disease.  He had recently developed pneumonia in September and at that time Covid was negative.  However in October a Covid test at [facility] was reportedly positive.  Patient has been seen by Dr. Radford Pax in our group in the past.  Patient admits to typically leaving dialysis after 3 hours rather than for on a routine basis.  There is a history of A. fib with RVR in 2015 after admission several days of hemodialysis and on presentation he was hyperkalemic and had acute pulmonary edema.  Anticoagulation was deferred in the setting of bleeding.  He presented to the emergency room today with increasing shortness of breath and found to be tachycardic with possible AF with RVR for which he was placed on diltiazem and started on 15 mm drip without significant benefit.  We have given him an additional 5 mg of IV Lopressor and heart rate presently is in the 120s and appears regular it is hard to discern if this is any flutter.  His last echo Doppler study in September 2021 showed an EF of 50 to 55% without regional wall motion abnormalities.  There was moderate pulmonary hypertension with estimated PA systolic pressure 60.7 mm.  There was mild LVH, severe left atrial dilatation with mild RA dilation and severe mitral annular calcification with mild MR and reported moderate mitral stenosis.  There was aortic sclerosis without stenosis.  Patient was not evaluated by cardiology at that time.  On exam, he appears much older than stated age.  JVD approximately 8 cm.  He has diffuse rhonchi with decreased breath sounds without wheezing on lung exam.   Rhythm is tachycardic at 120 bpm.  There was a 1/6 systolic murmur it was hard to appreciate a diastolic murmur abdomen is nontender.  AV fistula is present in the left arm.  There was no edema.  Troponin is mildly elevated at 58 most likely due to demand ischemia. ECG seems to show a regular tachycardia 138 bpm without irregularity arguing against A. fib.  Creatinine 9.74, potassium elevated at 5.8, magnesium elevated at 2.8, lactic acid 1.6, hemoglobin 10.2/hematocrit 35.2, platelets 244.  Presently with increased heart rate will rebolused with diltiazem 10 mg and continue 15 mg/h infusion.  Obtain serial troponins.  Patient is scheduled to undergo dialysis tomorrow.  Ultimately usual Cardizem rather than amlodipine for calcium channel blockade with PAF/possible A flutter  history.  Consider reassessment of mitral valve with future echo or TEE.  Troy Sine, MD, [facility] 03/25/2020 5:34 PM

## 2020-03-25 NOTE — ED Notes (Signed)
Got patient into gown on the monitor did ekg shown to Dr Melina Copa patient is resting with call bell in reach

## 2020-03-25 NOTE — Progress Notes (Signed)
Interim progress note  Received call from nurse reporting that patient was very upset and was threatening to leave AMA.  Dr. Larae Grooms I visited the patient in his room in the ED, introduced ourselves.  Patient reported he was having abdominal pain, continued shortness of breath (of note patient satting 99% on 3L [place]), says he cannot relax or sit still.   Discussed with attending and reviewed with pharmacy. Had pharmacy review patient's EKG which showed QTc in 530s, Calculated QTc improved at 425-485.  -Will give Atarax to help with sleep and comfort -If patient does not improve with Atarax will give stronger pain med, consider Tylenol 1000mg , consider Dilaudid 0.5mg .     Milus Banister, [place], PGY-3 03/25/2020 10:15 PM

## 2020-03-25 NOTE — Consult Note (Signed)
[facility] Renal Consultation Note    Indication for Consultation:  Management of ESRD/hemodialysis; anemia, hypertension/volume and secondary hyperparathyroidism   HPI: Jeremy Mccoy is a 54 y.o. male with ESRD on HD, CHF, COPD, HTN, seizure disorder, polysubstance abuse, noncompliance with dialysis, chronic hyperkalemia.   Presented to ED from his dialysis unit with chest pain, dyspnea. Did not receive any dialysis today. Found to be in AFib with RVR on arrival. HR 130s. Cardizem ordered in the ED. CXR shows interstitial edema vs atypical infection  Labs K 5.8, CO2 27, BUN 80 Cr 9.74, Hgb 10.2.   Chronic noncompliance with prescribed dialysis time -cuts all treatments short. Last dialysis was Monday -got 3 hours and left 2kg over dry weight. Other issues include chronic hyperkalemia and large IDWG. Dialysis via L  AVF.   Appears comfortable in the ED. No chest pain at present. Reports ongoing dizziness, DOE  for months. Getting progressively worse. He thinks his dry weight needs to be lowered. Wants to eat before he has dialysis.    Past Medical History:  Diagnosis Date  . Anemia   . Anxiety   . Arthritis    "qwhere" (05/15/2013)  . CHF (congestive heart failure) ([place])   . COPD (chronic obstructive pulmonary disease) ([place])   . Crack cocaine use   . Dental caries   . Depression   . ESRD (end stage renal disease) on dialysis [facility])    TTS: [address] [place] (05/15/2013)  . Headache(784.0)    "get it from going to dialysis; when they get real bad I come to hospital" (05/15/2013)  . Hematemesis/vomiting blood   . History of blood transfusion 10/2011; 04/2013  . HTN (hypertension) 06/12/2012  . Seizures ([place])   . Shortness of breath    "just when I have too much potassium is too high" (05/15/2013)   Past Surgical History:  Procedure Laterality Date  . AV FISTULA PLACEMENT  11/29/2011   Procedure: ARTERIOVENOUS (AV) FISTULA CREATION;  Surgeon: Angelia Mould, MD;  Location: [facility] OR;  Service: Vascular;  Laterality: Left;  . ESOPHAGOGASTRODUODENOSCOPY N/A 05/17/2013   Procedure: ESOPHAGOGASTRODUODENOSCOPY (EGD);  Surgeon: Beryle Beams, MD;  Location: [facility] ENDOSCOPY;  Service: Endoscopy;  Laterality: N/A;  . INGUINAL HERNIA REPAIR Bilateral 1967  . IR FLUORO GUIDE CV LINE RIGHT  10/20/2017  . IR THROMBECTOMY AV FISTULA W/THROMBOLYSIS/PTA INC/SHUNT/IMG LEFT Left 10/24/2017  . IR US GUIDE VASC ACCESS LEFT  10/24/2017  . IR US GUIDE VASC ACCESS RIGHT  10/20/2017  . RENAL BIOPSY  11/07/2011   Family History  Problem Relation Age of Onset  . Heart attack Father    Social History:  reports that he has quit smoking. His smoking use included cigars and cigarettes. He has a 50.00 pack-year smoking history. He has never used smokeless tobacco. He reports current drug use. Drugs: Marijuana and Cocaine. He reports that he does not drink alcohol. No Known Allergies Prior to Admission medications   Medication Sig Start Date End Date Taking? Authorizing Provider  acetaminophen (TYLENOL) 500 MG tablet Take 1 tablet (500 mg total) by mouth every 8 (eight) hours as needed for mild pain or fever. 6 times a day, 500 mg, 12 to 24 tabs a day, 6,000 to 12,000 mg daily for pain Patient taking differently: Take 500-1,000 mg by mouth every 8 (eight) hours as needed for mild pain or fever.  08/05/16  Yes Molt, Bethany, DO  albuterol (VENTOLIN HFA) 108 (90 Base) MCG/ACT inhaler Inhale 3-6  puffs into the lungs every 6 (six) hours as needed for wheezing or shortness of breath.    Yes [provider]  amLODipine (NORVASC) 10 MG tablet Take 10 mg by mouth daily. 03/06/20  Yes [provider]  B Complex-C-Zn-Folic Acid (DIALYVITE/ZINC) TABS Take 1 tablet by mouth daily.  09/11/19  Yes [provider]  bismuth subsalicylate (PEPTO BISMOL) 262 MG/15ML suspension Take 30 mLs by mouth every 6 (six) hours as needed for indigestion or diarrhea or loose stools.    Yes [provider]  cloNIDine (CATAPRES) 0.3 MG tablet Take 0.3 mg by mouth 3 (three) times daily as needed (blood pressure).    Yes [provider]  diphenhydrAMINE (BENADRYL) 25 mg capsule Take 25 mg by mouth every 6 (six) hours as needed for itching.   Yes [provider]  famotidine (ZANTAC 360 MAX ST) 20 MG tablet Take 20 mg by mouth 2 (two) times daily as needed for heartburn or indigestion.   Yes [provider]  hydrALAZINE (APRESOLINE) 25 MG tablet Take 25 mg by mouth 3 (three) times daily as needed (high blood pressure).  07/20/19  Yes [provider]  hydrOXYzine (ATARAX/VISTARIL) 25 MG tablet Take 25 mg by mouth 3 (three) times daily as needed for itching.  06/10/15  Yes [provider]  Nutritional Supplements (FEEDING SUPPLEMENT, NEPRO CARB STEADY,) LIQD Take 237 mLs by mouth 3 (three) times daily as needed (Supplement). Patient taking differently: Take 237 mLs by mouth 3 (three) times a week.  05/17/13  Yes Mikhail, Maryann, DO  pantoprazole (PROTONIX) 40 MG tablet Take 1 tablet (40 mg total) by mouth daily. 01/23/20  Yes Andrew Au, MD  sevelamer carbonate (RENVELA) 800 MG tablet Take 2 tablets (1,600 mg total) by mouth 3 (three) times daily with meals. Patient taking differently: Take 800-2,400 mg by mouth See admin instructions. Take 2,400 mg by mouth three times a day with meals and 800-1,600 mg with each snack 01/25/14  Yes McLean-Scocuzza, Nino Glow, MD  aspirin 81 MG chewable tablet Chew 1 tablet (81 mg total) by mouth daily. Patient not taking: Reported on 01/20/2020 01/25/14   McLean-Scocuzza, Nino Glow, MD  azithromycin [facility]) 250 MG tablet Take 1 tablet in the morning Patient not taking: Reported on 03/25/2020 01/23/20   Andrew Au, MD  cefdinir (OMNICEF) 300 MG capsule Take 1 capsule after dialysis today, and 1 after dialysis tomorrow Patient not taking: Reported on 03/25/2020 01/23/20   Andrew Au, MD  diltiazem (CARTIA  XT) 240 MG 24 hr capsule Take 1 capsule (240 mg total) by mouth daily. Patient not taking: Reported on 03/25/2020 01/23/20 01/22/21  Andrew Au, MD  nicotine (NICODERM CQ - DOSED IN MG/24 HOURS) 14 mg/24hr patch Place 1 patch (14 mg total) onto the skin daily. Patient not taking: Reported on 03/25/2020 01/24/20   Andrew Au, MD  polyethylene glycol [facility] / Floria Raveling) packet Take 17 g by mouth daily as needed for mild constipation. Patient not taking: Reported on 03/25/2020 08/05/16   Molt, Bethany, DO   Current Facility-Administered Medications  Medication Dose Route Frequency Provider Last Rate Last Admin  . diltiazem (CARDIZEM) 125 mg in dextrose 5% 125 mL (1 mg/mL) infusion  5-15 mg/hr Intravenous Continuous Hayden Rasmussen, MD 5 mL/hr at 03/25/20 1417 5 mg/hr at 03/25/20 1417  . sodium zirconium cyclosilicate (LOKELMA) packet 10 g  10 g Oral Once Hayden Rasmussen, MD       Current Outpatient  Medications  Medication Sig Dispense Refill  . acetaminophen (TYLENOL) 500 MG tablet Take 1 tablet (500 mg total) by mouth every 8 (eight) hours as needed for mild pain or fever. 6 times a day, 500 mg, 12 to 24 tabs a day, 6,000 to 12,000 mg daily for pain (Patient taking differently: Take 500-1,000 mg by mouth every 8 (eight) hours as needed for mild pain or fever. ) 30 tablet 0  . albuterol (VENTOLIN HFA) 108 (90 Base) MCG/ACT inhaler Inhale 3-6 puffs into the lungs every 6 (six) hours as needed for wheezing or shortness of breath.     Marland Kitchen amLODipine (NORVASC) 10 MG tablet Take 10 mg by mouth daily.    . B Complex-C-Zn-Folic Acid (DIALYVITE/ZINC) TABS Take 1 tablet by mouth daily.     Marland Kitchen bismuth subsalicylate (PEPTO BISMOL) 262 MG/15ML suspension Take 30 mLs by mouth every 6 (six) hours as needed for indigestion or diarrhea or loose stools.    . cloNIDine (CATAPRES) 0.3 MG tablet Take 0.3 mg by mouth 3 (three) times daily as needed (blood pressure).     . diphenhydrAMINE (BENADRYL) 25 mg capsule  Take 25 mg by mouth every 6 (six) hours as needed for itching.    . famotidine (ZANTAC 360 MAX ST) 20 MG tablet Take 20 mg by mouth 2 (two) times daily as needed for heartburn or indigestion.    . hydrALAZINE (APRESOLINE) 25 MG tablet Take 25 mg by mouth 3 (three) times daily as needed (high blood pressure).     . hydrOXYzine (ATARAX/VISTARIL) 25 MG tablet Take 25 mg by mouth 3 (three) times daily as needed for itching.   6  . Nutritional Supplements (FEEDING SUPPLEMENT, NEPRO CARB STEADY,) LIQD Take 237 mLs by mouth 3 (three) times daily as needed (Supplement). (Patient taking differently: Take 237 mLs by mouth 3 (three) times a week. )  0  . pantoprazole (PROTONIX) 40 MG tablet Take 1 tablet (40 mg total) by mouth daily. 30 tablet 0  . sevelamer carbonate (RENVELA) 800 MG tablet Take 2 tablets (1,600 mg total) by mouth 3 (three) times daily with meals. (Patient taking differently: Take 800-2,400 mg by mouth See admin instructions. Take 2,400 mg by mouth three times a day with meals and 800-1,600 mg with each snack) 90 tablet 0  . aspirin 81 MG chewable tablet Chew 1 tablet (81 mg total) by mouth daily. (Patient not taking: Reported on 01/20/2020) 30 tablet 11  . azithromycin (ZITHROMAX) 250 MG tablet Take 1 tablet in the morning (Patient not taking: Reported on 03/25/2020) 1 each 0  . cefdinir (OMNICEF) 300 MG capsule Take 1 capsule after dialysis today, and 1 after dialysis tomorrow (Patient not taking: Reported on 03/25/2020) 2 capsule 0  . diltiazem (CARTIA XT) 240 MG 24 hr capsule Take 1 capsule (240 mg total) by mouth daily. (Patient not taking: Reported on 03/25/2020) 30 capsule 0  . nicotine (NICODERM CQ - DOSED IN MG/24 HOURS) 14 mg/24hr patch Place 1 patch (14 mg total) onto the skin daily. (Patient not taking: Reported on 03/25/2020) 14 patch 0  . polyethylene glycol (MIRALAX / GLYCOLAX) packet Take 17 g by mouth daily as needed for mild constipation. (Patient not taking: Reported on  03/25/2020) 14 each 0     ROS: As per HPI otherwise negative.  Physical Exam: Vitals:   03/25/20 1315 03/25/20 1330 03/25/20 1345 03/25/20 1400  BP: (!) 126/99 (!) 130/105 (!) 130/113 (!) 122/95  Pulse: (!) 139   (!) 138  Resp: (!) 40 (!) 32 (!) 23 (!) 28  Temp:      TempSrc:      SpO2: 98%   96%     General: WNWD, nad, on nasal oxygen  Head: NCAT sclera not icteric MMM Neck: Supple. +JVD  Lungs: Fine crackles at bases, bilaterally  Heart: Tachy, regular. AFib on monitor  Abdomen: soft non-tender  Lower extremities:without edema or ischemic changes, no open wounds  Neuro: A & O  X 3. Moves all extremities spontaneously. Psych:  Responds to questions appropriately with a normal affect. Dialysis Access: LUE AVF +bruit   Labs: Basic Metabolic Panel: Recent Labs  Lab 03/25/20 0957  NA 136  K 5.8*  CL 91*  CO2 27  GLUCOSE 105*  BUN 80*  CREATININE 9.74*  CALCIUM 10.2   Liver Function Tests: Recent Labs  Lab 03/25/20 0957  AST 21  ALT 26  ALKPHOS 162*  BILITOT 0.8  PROT 6.9  ALBUMIN 2.8*   Recent Labs  Lab 03/25/20 0957  LIPASE 24   No results for input(s): AMMONIA in the last 168 hours. CBC: Recent Labs  Lab 03/25/20 0957  WBC 5.4  NEUTROABS 4.0  HGB 10.2*  HCT 35.2*  MCV 96.2  PLT 244   Cardiac Enzymes: No results for input(s): CKTOTAL, CKMB, CKMBINDEX, TROPONINI in the last 168 hours. CBG: No results for input(s): GLUCAP in the last 168 hours. Iron Studies: No results for input(s): IRON, TIBC, TRANSFERRIN, FERRITIN in the last 72 hours. Studies/Results: DG Chest Port 1 View  Result Date: 03/25/2020 CLINICAL DATA:  Chest pain, shortness of breath. EXAM: PORTABLE CHEST 1 VIEW COMPARISON:  01/20/2020. FINDINGS: Improved consolidation in the right upper lobe, compatible with improved pneumonia. Increased diffuse interstitial prominence, which may represent interstitial pulmonary edema versus atypical infection. Similar cardiomediastinal  silhouette. Please see recent CT chest for characterization of conglomerate mediastinal and perihilar lymphadenopathy. No visible pleural effusions or pneumothorax. IMPRESSION: 1. Improved consolidation in the right upper lobe, compatible with improved pneumonia. Recommend continued imaging follow-up to resolution. 2. Increased diffuse interstitial prominence, which may represent interstitial pulmonary edema versus atypical infection. 3. Please see recent CT chest for characterization of conglomerate mediastinal and perihilar lymphadenopathy. Electronically Signed   By: Margaretha Sheffield MD   On: 03/25/2020 10:34    Dialysis Orders:  AF MWF 4h 34min 500/800 66.5kg 1K/2.5Ca  AVF No heparin Hectorol 3 TIW Parsabiv 15 Mircera 225 (last 11/8)   Assessment/Plan: 1.  AFib w RVR on admission --Cardizem ordered in ED. Per primary  2.  ESRD -  HD MWF. Did not receive HD today. Truncates all treatments as outpatient. He prefers dialysis in am. Plan HD 1st shift tomorrow. Back on schedule Friday.  3. Hyperkalemia -chronic issue. On 1K bath + weekly Veltassa as outpatient. Loklema ordered here.  4.  Hypertension/volume  - BP ok. Chronic volume overload. Likely pulm edema on CXR.  5.  Anemia  - Hgb 10.2. Recent ESA as outpatient. Follow. H/o GI bleeding -to see GI as outpatient  6.  Metabolic bone disease -  Continue binders, Hectorol. Parsabiv not on [place] PA-C [facility] 03/25/2020, 2:31 PM

## 2020-03-25 NOTE — ED Provider Notes (Signed)
[facility] Provider Note   CSN: [id number] Arrival date & time: 03/25/20  4098     History Chief Complaint  Patient presents with  . Shortness of Breath  . Chest Pain    Jeremy Mccoy is a 54 y.o. male.  He has multiple medical problems including end-stage renal disease dialysis Monday Wednesday Friday.  He also has a history of A. fib COPD CHF.  He said his heart rate has been elevated for over a month.  He went to dialysis today and they sent him here because he was having shortness of breath and chest pain.  He also complains of some abdominal distention.  He said he has been eating and drinking well and has not used drugs in over a month.  The history is provided by the patient.  Shortness of Breath Severity:  Moderate Onset quality:  Gradual Duration:  1 month Timing:  Intermittent Progression:  Unchanged Chronicity:  Recurrent Relieved by:  Nothing Worsened by:  Activity Ineffective treatments:  None tried Associated symptoms: chest pain and cough   Associated symptoms: no abdominal pain, no fever, no headaches, no hemoptysis, no neck pain, no rash, no sore throat and no vomiting   Chest pain:    Quality: pressure     Severity:  Moderate   Onset quality:  Gradual   Duration:  1 month   Timing:  Intermittent   Progression:  Unchanged   Chronicity:  New Chest Pain Associated symptoms: cough and shortness of breath   Associated symptoms: no abdominal pain, no fever, no headache and no vomiting        Past Medical History:  Diagnosis Date  . Anemia   . Anxiety   . Arthritis    "qwhere" (05/15/2013)  . CHF (congestive heart failure) ([place])   . COPD (chronic obstructive pulmonary disease) ([place])   . Crack cocaine use   . Dental caries   . Depression   . ESRD (end stage renal disease) on dialysis [facility])    TTS: [address] [place] (05/15/2013)  . Headache(784.0)    "get it from going to dialysis; when they get real  bad I come to hospital" (05/15/2013)  . Hematemesis/vomiting blood   . History of blood transfusion 10/2011; 04/2013  . HTN (hypertension) 06/12/2012  . Seizures ([place])   . Shortness of breath    "just when I have too much potassium is too high" (05/15/2013)    Patient Active Problem List   Diagnosis Date Noted  . Hyperkalemia, diminished renal excretion 01/20/2020  . Pneumonia 01/20/2020  . ESRD (end stage renal disease) on dialysis ([place]) 10/20/2017  . Plantar fasciitis, bilateral 09/12/2017  . Hyperkalemia 02/23/2016  . Coughing blood   . Vasculitis ([place])   . Lung crackles   . Hemoptysis 07/25/2015  . Anemia 02/20/2014  . Symptomatic anemia 02/20/2014  . PAF (paroxysmal atrial fibrillation) ([place]) 01/31/2014  . Chronic diastolic congestive heart failure ([place]) 01/31/2014  . COPD (chronic obstructive pulmonary disease) ([place]) 01/31/2014  . Thrombocytopenia ([place]) 01/23/2014  . Unspecified constipation 01/23/2014  . High anion gap metabolic acidosis 11/91/4782  . ESRD on hemodialysis ([place]) 11/05/2013  . Seizure disorder ([place]) 11/04/2013  . Protein-calorie malnutrition, severe ([place]) 07/12/2013  . Respiratory failure ([place]) 07/10/2013  . Status epilepticus ([place]) 07/10/2013  . Essential hypertension 07/10/2013  . Acute blood loss anemia 05/15/2013  . Hematemesis 05/15/2013  . Volume excess 09/20/2012  . Abnormal EKG 09/20/2012  . Drug abuse ([place])  09/20/2012  . Elevated troponin 09/20/2012  . Anemia in chronic kidney disease 09/20/2012  . Pulmonary edema 06/12/2012  . Cocaine abuse ([place]) 06/12/2012  . End stage renal disease ([place]) 01/11/2012  . Tobacco abuse 11/04/2011  . Dental caries 11/04/2011  . Color blindness 11/04/2011    Past Surgical History:  Procedure Laterality Date  . AV FISTULA PLACEMENT  11/29/2011   Procedure: ARTERIOVENOUS (AV) FISTULA CREATION;  Surgeon: Angelia Mould, MD;  Location: [facility] OR;  Service: Vascular;  Laterality: Left;  .  ESOPHAGOGASTRODUODENOSCOPY N/A 05/17/2013   Procedure: ESOPHAGOGASTRODUODENOSCOPY (EGD);  Surgeon: Beryle Beams, MD;  Location: [facility] ENDOSCOPY;  Service: Endoscopy;  Laterality: N/A;  . INGUINAL HERNIA REPAIR Bilateral 1967  . IR FLUORO GUIDE CV LINE RIGHT  10/20/2017  . IR THROMBECTOMY AV FISTULA W/THROMBOLYSIS/PTA INC/SHUNT/IMG LEFT Left 10/24/2017  . IR US GUIDE VASC ACCESS LEFT  10/24/2017  . IR US GUIDE VASC ACCESS RIGHT  10/20/2017  . RENAL BIOPSY  11/07/2011       Family History  Problem Relation Age of Onset  . Heart attack Father     Social History   Tobacco Use  . Smoking status: Former Smoker    Packs/day: 1.00    Years: 50.00    Pack years: 50.00    Types: Cigars, Cigarettes  . Smokeless tobacco: Never Used  Substance Use Topics  . Alcohol use: No    Comment: 05/15/2013 "aien't drank in ~ 25 yrs"  . Drug use: Yes    Types: Marijuana, Cocaine    Comment: Precription drugs (e.g. percocet) "I take what I have to to controll my constant pain" (05/15/2013)    Home Medications Prior to Admission medications   Medication Sig Start Date End Date Taking? Authorizing Provider  acetaminophen (TYLENOL) 500 MG tablet Take 1 tablet (500 mg total) by mouth every 8 (eight) hours as needed for mild pain or fever. 6 times a day, 500 mg, 12 to 24 tabs a day, 6,000 to 12,000 mg daily for pain Patient taking differently: Take 500-1,000 mg by mouth every 8 (eight) hours as needed for mild pain or fever.  08/05/16   Molt, Bethany, DO  aspirin 81 MG chewable tablet Chew 1 tablet (81 mg total) by mouth daily. Patient not taking: Reported on 01/20/2020 01/25/14   McLean-Scocuzza, Nino Glow, MD  azithromycin [facility]) 250 MG tablet Take 1 tablet in the morning 01/23/20   Andrew Au, MD  B Complex-C-Zn-Folic Acid (DIALYVITE/ZINC) TABS Take 1 tablet by mouth daily.  09/11/19   [provider]  calcium carbonate (OS-CAL) 1250 (500 Ca) MG chewable tablet Chew by mouth. Patient not taking:  Reported on 01/20/2020    [provider]  cefdinir (OMNICEF) 300 MG capsule Take 1 capsule after dialysis today, and 1 after dialysis tomorrow 01/23/20   Andrew Au, MD  cinacalcet [facility]) 90 MG tablet  10/10/19   [provider]  cloNIDine (CATAPRES) 0.3 MG tablet Take 0.3 mg by mouth 3 (three) times daily.     [provider]  diltiazem (CARTIA XT) 240 MG 24 hr capsule Take 1 capsule (240 mg total) by mouth daily. 01/23/20 01/22/21  Andrew Au, MD  diphenhydrAMINE (BENADRYL) 25 mg capsule Take 25 mg by mouth every 6 (six) hours as needed for itching.    [provider]  Diphenhydramine-APAP 12.5-325 MG TABS Take by mouth. Patient not taking: Reported on 01/20/2020 06/03/19 06/01/20  [provider]  doxercalciferol (HECTOROL) 0.5 MCG capsule  Doxercalciferol (Hectorol) 11/22/19 11/20/20  [provider]  Etelcalcetide HCl (PARSABIV IV) Etelcalcetide [facility] Staggers) 11/13/19 11/09/20  [provider]  hydrALAZINE (APRESOLINE) 25 MG tablet Take 25 mg by mouth 3 (three) times daily. 07/20/19   [provider]  hydrOXYzine (ATARAX/VISTARIL) 25 MG tablet Take 25 mg by mouth in the morning, at noon, and at bedtime.  06/10/15   [provider]  nicotine (NICODERM CQ - DOSED IN MG/24 HOURS) 14 mg/24hr patch Place 1 patch (14 mg total) onto the skin daily. 01/24/20   Andrew Au, MD  Nutritional Supplements (FEEDING SUPPLEMENT, NEPRO CARB STEADY,) LIQD Take 237 mLs by mouth 3 (three) times daily as needed (Supplement). Patient taking differently: Take 237 mLs by mouth 3 (three) times a week.  05/17/13   Mikhail, Velta Addison, DO  pantoprazole (PROTONIX) 40 MG tablet Take 1 tablet (40 mg total) by mouth daily. 01/23/20   Andrew Au, MD  patiromer (VELTASSA) 8.4 g packet Take 8.4 g by mouth as directed.  09/18/19   [provider]  polyethylene glycol (MIRALAX / GLYCOLAX) packet Take 17 g by mouth daily as needed for mild constipation.  08/05/16   Molt, Bethany, DO  ranitidine (ZANTAC) 300 MG tablet Take 300 mg by mouth daily as needed for heartburn. Patient not taking: Reported on 01/20/2020    [provider]  sevelamer carbonate (RENVELA) 800 MG tablet Take 2 tablets (1,600 mg total) by mouth 3 (three) times daily with meals. Patient taking differently: Take 800-2,400 mg by mouth 3 (three) times daily with meals. Take 2,400 mg by mouth three times a day with meals and 800-1,600 mg with each snack 01/25/14   McLean-Scocuzza, Nino Glow, MD    Allergies    Patient has no known allergies.  Review of Systems   Review of Systems  Constitutional: Negative for fever.  HENT: Negative for sore throat.   Eyes: Negative for visual disturbance.  Respiratory: Positive for cough and shortness of breath. Negative for hemoptysis.   Cardiovascular: Positive for chest pain.  Gastrointestinal: Positive for abdominal distention. Negative for abdominal pain and vomiting.  Genitourinary: Negative for hematuria.  Musculoskeletal: Negative for neck pain.  Skin: Negative for rash.  Neurological: Negative for headaches.    Physical Exam Updated Vital Signs BP (!) 137/110 (BP Location: Right Arm)   Pulse (!) 139   Temp 97.8 F (36.6 C) (Oral)   Resp 20   SpO2 92%   Physical Exam Vitals and nursing note reviewed.  Constitutional:      Appearance: Normal appearance. He is well-developed.  HENT:     Head: Normocephalic and atraumatic.  Eyes:     Conjunctiva/sclera: Conjunctivae normal.  Cardiovascular:     Rate and Rhythm: Regular rhythm. Tachycardia present.     Heart sounds: No murmur heard.   Pulmonary:     Effort: Tachypnea and accessory muscle usage present. No respiratory distress.     Breath sounds: Normal breath sounds.  Abdominal:     Palpations: Abdomen is soft.     Tenderness: There is no abdominal tenderness.  Musculoskeletal:        General: No deformity or signs of injury. Normal range of motion.      Cervical back: Neck supple.     Right lower leg: No tenderness.     Left lower leg: No tenderness.     Comments: Fistula left forearm with positive thrill  Skin:    General: Skin is warm and dry.  Capillary Refill: Capillary refill takes less than 2 seconds.  Neurological:     General: No focal deficit present.     Mental Status: He is alert.     ED Results / Procedures / Treatments   Labs (all labs ordered are listed, but only abnormal results are displayed) Labs Reviewed  COMPREHENSIVE METABOLIC PANEL - Abnormal; Notable for the following components:      Result Value   Potassium 5.8 (*)    Chloride 91 (*)    Glucose, Bld 105 (*)    BUN 80 (*)    Creatinine, Ser 9.74 (*)    Albumin 2.8 (*)    Alkaline Phosphatase 162 (*)    GFR, Estimated 6 (*)    Anion gap 18 (*)    All other components within normal limits  CBC WITH DIFFERENTIAL/PLATELET - Abnormal; Notable for the following components:   RBC 3.66 (*)    Hemoglobin 10.2 (*)    HCT 35.2 (*)    MCHC 29.0 (*)    RDW 21.8 (*)    nRBC 0.6 (*)    Abs Immature Granulocytes 0.12 (*)    All other components within normal limits  MAGNESIUM - Abnormal; Notable for the following components:   Magnesium 2.8 (*)    All other components within normal limits  TROPONIN I (HIGH SENSITIVITY) - Abnormal; Notable for the following components:   Troponin I (High Sensitivity) 64 (*)    All other components within normal limits  TROPONIN I (HIGH SENSITIVITY) - Abnormal; Notable for the following components:   Troponin I (High Sensitivity) 58 (*)    All other components within normal limits  CULTURE, BLOOD (ROUTINE X 2)  CULTURE, BLOOD (ROUTINE X 2)  LIPASE, BLOOD  LACTIC ACID, PLASMA  PROTIME-INR  CBC  RENAL FUNCTION PANEL  TYPE AND SCREEN    EKG EKG Interpretation  Date/Time:  Wednesday March 25 2020 09:49:46 EST Ventricular Rate:  138 PR Interval:    QRS Duration: 101 QT Interval:  354 QTC Calculation: 537 R  Axis:   -87 Text Interpretation: Sinus tachycardia vs afib with rvr LVH with secondary repolarization abnormality Inferior infarct, age indeterminate Anterior infarct, old Prolonged QT interval Confirmed by Aletta Edouard [phone]) on 03/25/2020 10:11:30 AM   Radiology DG Chest Port 1 View  Result Date: 03/25/2020 CLINICAL DATA:  Chest pain, shortness of breath. EXAM: PORTABLE CHEST 1 VIEW COMPARISON:  01/20/2020. FINDINGS: Improved consolidation in the right upper lobe, compatible with improved pneumonia. Increased diffuse interstitial prominence, which may represent interstitial pulmonary edema versus atypical infection. Similar cardiomediastinal silhouette. Please see recent CT chest for characterization of conglomerate mediastinal and perihilar lymphadenopathy. No visible pleural effusions or pneumothorax. IMPRESSION: 1. Improved consolidation in the right upper lobe, compatible with improved pneumonia. Recommend continued imaging follow-up to resolution. 2. Increased diffuse interstitial prominence, which may represent interstitial pulmonary edema versus atypical infection. 3. Please see recent CT chest for characterization of conglomerate mediastinal and perihilar lymphadenopathy. Electronically Signed   By: Margaretha Sheffield MD   On: 03/25/2020 10:34    Procedures .Critical Care Performed by: Hayden Rasmussen, MD Authorized by: Hayden Rasmussen, MD   Critical care provider statement:    Critical care time (minutes):  45   Critical care time was exclusive of:  Separately billable procedures and treating other patients   Critical care was necessary to treat or prevent imminent or life-threatening deterioration of the following conditions:  Cardiac failure   Critical care was time spent  personally by me on the following activities:  Discussions with consultants, evaluation of patient's response to treatment, examination of patient, ordering and performing treatments and interventions,  ordering and review of laboratory studies, ordering and review of radiographic studies, pulse oximetry, re-evaluation of patient's condition, obtaining history from patient or surrogate, review of old charts and development of treatment plan with patient or surrogate   (including critical care time)  Medications Ordered in ED Medications  diltiazem (CARDIZEM) 1 mg/mL load via infusion 20 mg (20 mg Intravenous Bolus from Bag 03/25/20 1416)    And  diltiazem (CARDIZEM) 125 mg in dextrose 5% 125 mL (1 mg/mL) infusion (15 mg/hr Intravenous Rate/Dose Change 03/25/20 1456)  heparin injection 5,000 Units (has no administration in time range)  aspirin chewable tablet 81 mg (81 mg Oral Given 03/25/20 1638)  nicotine (NICODERM CQ - dosed in mg/24 hours) patch 14 mg (14 mg Transdermal Patch Applied 03/25/20 1829)  acetaminophen (TYLENOL) tablet 500 mg (has no administration in time range)  hydrOXYzine (ATARAX/VISTARIL) tablet 25 mg (has no administration in time range)  famotidine (PEPCID) tablet 20 mg (has no administration in time range)  pantoprazole (PROTONIX) EC tablet 40 mg (40 mg Oral Given 03/25/20 1638)  sevelamer carbonate (RENVELA) tablet 2,400 mg (2,400 mg Oral Given 03/25/20 1759)  albuterol (PROVENTIL) (2.5 MG/3ML) 0.083% nebulizer solution 3 mL (has no administration in time range)  sevelamer carbonate (RENVELA) tablet 800-1,600 mg (has no administration in time range)  diltiazem (CARDIZEM) injection 15 mg (15 mg Intravenous Given 03/25/20 1103)  diltiazem (CARDIZEM CD) 24 hr capsule 240 mg (240 mg Oral Given 03/25/20 1121)  diltiazem (CARDIZEM) injection 20 mg (20 mg Intravenous Given 03/25/20 1253)  sodium zirconium cyclosilicate (LOKELMA) packet 10 g (10 g Oral Given 03/25/20 1456)  metoprolol tartrate (LOPRESSOR) injection 5 mg (5 mg Intravenous Given 03/25/20 1638)  diltiazem (CARDIZEM) 1 mg/mL load via infusion 10 mg (10 mg Intravenous Bolus from Bag 03/25/20 1754)    ED Course    I have reviewed the triage vital signs and the nursing notes.  Pertinent labs & imaging results that were available during my care of the patient were reviewed by me and considered in my medical decision making (see chart for details).  Clinical Course as of Mar 25 1850  Wed Mar 25, 2020  1012 Reviewed prior admissions.  He was admitted last month   [MB]  1029 Chest x-ray interpreted by me as continued right lung changes from prior chest x-ray 9/21.  Awaiting radiology reading.   [MB]  7517 Patient now tells me that after he left the hospital AMA for his last admission he has not been on any of his medication.  This is likely why his A. fib is rapid and uncontrolled.  Ordering him some IV diltiazem and his oral dose of calcium channel blocker.   [MB]  0017 Patient's heart rate not really bulging.  We will put him on a drip.  He is agreeable to admission.   [MB]  22 Discussed with family practice teaching service who will evaluate the patient for admission.   [MB]  4944 Discussed with Dr. Johnney Ou nephrology who recommends [facility] and somebody from the team will evaluate for dialysis.   [MB]    Clinical Course User Index [MB] Hayden Rasmussen, MD   MDM Rules/Calculators/A&P                         Jeremy Mccoy was  evaluated in Emergency Department on 03/25/2020 for the symptoms described in the history of present illness. He was evaluated in the context of the global COVID-19 pandemic, which necessitated consideration that the patient might be at risk for infection with the SARS-CoV-2 virus that causes COVID-19. Institutional protocols and algorithms that pertain to the evaluation of patients at risk for COVID-19 are in a state of rapid change based on information released by regulatory bodies including the CDC and federal and state organizations. These policies and algorithms were followed during the patient's care in the ED.  This patient complains of tachycardia shortness of  breath vague chest pain; this involves an extensive number of treatment Options and is a complaint that carries with it a high risk of complications and Morbidity. The differential includes A. fib, a flutter, CHF, COPD, PE, sinus tachycardia, metabolic derangement, pneumonia, Covid  I ordered, reviewed and interpreted labs, which included CBC with normal white count, hemoglobin low but better than baseline, chemistry with mildly elevated potassium, elevated BUN/creatinine in the setting of renal failure, normal INR, normal lactate, troponins elevated but not rising I ordered medication IV push of Cardizem x2, IV bolus and drip of Cardizem, oral Lokelma I ordered imaging studies which included chest x-ray and I independently    visualized and interpreted imaging which showed improved right upper lobe infiltrate from prior   Previous records obtained and reviewed reviewed prior admissions here and more recently at [facility] I consulted family practice teaching service along with nephrologist Dr. Johnney Ou and discussed lab and imaging findings  Critical Interventions: Management of rapid A. fib with IV medications, management of hyperkalemia with oral Lokelma  After the interventions stated above, I reevaluated the patient and found still to be symptomatic and tachycardic.  Will need admission for further management.  Patient in agreement with plan.   Final Clinical Impression(s) / ED Diagnoses Final diagnoses:  Atrial fibrillation with rapid ventricular response (HCC)  Hyperkalemia  Nonspecific chest pain  Shortness of breath    Rx / DC Orders ED Discharge Orders    None       Hayden Rasmussen, MD 03/25/20 1859

## 2020-03-26 ENCOUNTER — Encounter (HOSPITAL_COMMUNITY): Payer: Self-pay | Admitting: Family Medicine

## 2020-03-26 DIAGNOSIS — Z5181 Encounter for therapeutic drug level monitoring: Secondary | ICD-10-CM | POA: Diagnosis not present

## 2020-03-26 DIAGNOSIS — I272 Pulmonary hypertension, unspecified: Secondary | ICD-10-CM | POA: Diagnosis present

## 2020-03-26 DIAGNOSIS — R778 Other specified abnormalities of plasma proteins: Secondary | ICD-10-CM | POA: Diagnosis present

## 2020-03-26 DIAGNOSIS — N2581 Secondary hyperparathyroidism of renal origin: Secondary | ICD-10-CM | POA: Diagnosis not present

## 2020-03-26 DIAGNOSIS — I342 Nonrheumatic mitral (valve) stenosis: Secondary | ICD-10-CM | POA: Diagnosis present

## 2020-03-26 DIAGNOSIS — Z720 Tobacco use: Secondary | ICD-10-CM

## 2020-03-26 DIAGNOSIS — I484 Atypical atrial flutter: Secondary | ICD-10-CM | POA: Diagnosis not present

## 2020-03-26 DIAGNOSIS — Z7901 Long term (current) use of anticoagulants: Secondary | ICD-10-CM | POA: Diagnosis not present

## 2020-03-26 DIAGNOSIS — J9601 Acute respiratory failure with hypoxia: Secondary | ICD-10-CM | POA: Diagnosis present

## 2020-03-26 DIAGNOSIS — I351 Nonrheumatic aortic (valve) insufficiency: Secondary | ICD-10-CM | POA: Diagnosis not present

## 2020-03-26 DIAGNOSIS — I4891 Unspecified atrial fibrillation: Secondary | ICD-10-CM | POA: Diagnosis not present

## 2020-03-26 DIAGNOSIS — I5032 Chronic diastolic (congestive) heart failure: Secondary | ICD-10-CM | POA: Diagnosis not present

## 2020-03-26 DIAGNOSIS — H5350 Unspecified color vision deficiencies: Secondary | ICD-10-CM | POA: Diagnosis present

## 2020-03-26 DIAGNOSIS — K219 Gastro-esophageal reflux disease without esophagitis: Secondary | ICD-10-CM | POA: Diagnosis present

## 2020-03-26 DIAGNOSIS — I340 Nonrheumatic mitral (valve) insufficiency: Secondary | ICD-10-CM | POA: Diagnosis not present

## 2020-03-26 DIAGNOSIS — E875 Hyperkalemia: Secondary | ICD-10-CM | POA: Diagnosis not present

## 2020-03-26 DIAGNOSIS — I313 Pericardial effusion (noninflammatory): Secondary | ICD-10-CM | POA: Diagnosis present

## 2020-03-26 DIAGNOSIS — I4892 Unspecified atrial flutter: Secondary | ICD-10-CM | POA: Diagnosis not present

## 2020-03-26 DIAGNOSIS — I132 Hypertensive heart and chronic kidney disease with heart failure and with stage 5 chronic kidney disease, or end stage renal disease: Secondary | ICD-10-CM | POA: Diagnosis present

## 2020-03-26 DIAGNOSIS — I959 Hypotension, unspecified: Secondary | ICD-10-CM | POA: Diagnosis not present

## 2020-03-26 DIAGNOSIS — J189 Pneumonia, unspecified organism: Secondary | ICD-10-CM | POA: Diagnosis not present

## 2020-03-26 DIAGNOSIS — Z20822 Contact with and (suspected) exposure to covid-19: Secondary | ICD-10-CM | POA: Diagnosis present

## 2020-03-26 DIAGNOSIS — R739 Hyperglycemia, unspecified: Secondary | ICD-10-CM | POA: Diagnosis present

## 2020-03-26 DIAGNOSIS — J440 Chronic obstructive pulmonary disease with acute lower respiratory infection: Secondary | ICD-10-CM | POA: Diagnosis present

## 2020-03-26 DIAGNOSIS — F419 Anxiety disorder, unspecified: Secondary | ICD-10-CM | POA: Diagnosis present

## 2020-03-26 DIAGNOSIS — I5043 Acute on chronic combined systolic (congestive) and diastolic (congestive) heart failure: Secondary | ICD-10-CM | POA: Diagnosis present

## 2020-03-26 DIAGNOSIS — G40909 Epilepsy, unspecified, not intractable, without status epilepticus: Secondary | ICD-10-CM | POA: Diagnosis present

## 2020-03-26 DIAGNOSIS — R079 Chest pain, unspecified: Secondary | ICD-10-CM | POA: Diagnosis not present

## 2020-03-26 DIAGNOSIS — I480 Paroxysmal atrial fibrillation: Secondary | ICD-10-CM | POA: Diagnosis present

## 2020-03-26 DIAGNOSIS — D631 Anemia in chronic kidney disease: Secondary | ICD-10-CM | POA: Diagnosis present

## 2020-03-26 DIAGNOSIS — I5033 Acute on chronic diastolic (congestive) heart failure: Secondary | ICD-10-CM | POA: Diagnosis not present

## 2020-03-26 DIAGNOSIS — N186 End stage renal disease: Secondary | ICD-10-CM | POA: Diagnosis not present

## 2020-03-26 DIAGNOSIS — I361 Nonrheumatic tricuspid (valve) insufficiency: Secondary | ICD-10-CM | POA: Diagnosis not present

## 2020-03-26 DIAGNOSIS — R0602 Shortness of breath: Secondary | ICD-10-CM | POA: Diagnosis present

## 2020-03-26 DIAGNOSIS — E871 Hypo-osmolality and hyponatremia: Secondary | ICD-10-CM | POA: Diagnosis present

## 2020-03-26 DIAGNOSIS — I700 Atherosclerosis of aorta: Secondary | ICD-10-CM | POA: Diagnosis present

## 2020-03-26 LAB — RENAL FUNCTION PANEL
Albumin: 2.8 g/dL — ABNORMAL LOW (ref 3.5–5.0)
Anion gap: 22 — ABNORMAL HIGH (ref 5–15)
BUN: 95 mg/dL — ABNORMAL HIGH (ref 6–20)
CO2: 18 mmol/L — ABNORMAL LOW (ref 22–32)
Calcium: 9.7 mg/dL (ref 8.9–10.3)
Chloride: 85 mmol/L — ABNORMAL LOW (ref 98–111)
Creatinine, Ser: 10.58 mg/dL — ABNORMAL HIGH (ref 0.61–1.24)
GFR, Estimated: 5 mL/min — ABNORMAL LOW (ref >60)
Glucose, Bld: 291 mg/dL — ABNORMAL HIGH (ref 70–99)
Phosphorus: 8 mg/dL — ABNORMAL HIGH (ref 2.5–4.6)
Potassium: 7.3 mmol/L (ref 3.5–5.1)
Sodium: 125 mmol/L — ABNORMAL LOW (ref 135–145)

## 2020-03-26 LAB — RESPIRATORY PANEL BY RT PCR (FLU A&B, COVID)
Influenza A by PCR: NEGATIVE
Influenza B by PCR: NEGATIVE
SARS Coronavirus 2 by RT PCR: NEGATIVE

## 2020-03-26 LAB — CBC
HCT: 35.2 % — ABNORMAL LOW (ref 39.0–52.0)
Hemoglobin: 10.2 g/dL — ABNORMAL LOW (ref 13.0–17.0)
MCH: 27.6 pg (ref 26.0–34.0)
MCHC: 29 g/dL — ABNORMAL LOW (ref 30.0–36.0)
MCV: 95.4 fL (ref 80.0–100.0)
Platelets: 275 10*3/uL (ref 150–400)
RBC: 3.69 MIL/uL — ABNORMAL LOW (ref 4.22–5.81)
RDW: 22.5 % — ABNORMAL HIGH (ref 11.5–15.5)
WBC: 9.4 10*3/uL (ref 4.0–10.5)
nRBC: 0.7 % — ABNORMAL HIGH (ref 0.0–0.2)

## 2020-03-26 LAB — CBG MONITORING, ED: Glucose-Capillary: 266 mg/dL — ABNORMAL HIGH (ref 70–99)

## 2020-03-26 MED ORDER — PENTAFLUOROPROP-TETRAFLUOROETH EX AERO
1.0000 "application " | INHALATION_SPRAY | CUTANEOUS | Status: DC | PRN
  Filled 2020-03-26: qty 116

## 2020-03-26 MED ORDER — ALTEPLASE 2 MG IJ SOLR: 2.0000 mg | Freq: Once | INTRAMUSCULAR | Status: DC | PRN

## 2020-03-26 MED ORDER — METOPROLOL TARTRATE 25 MG PO TABS
25.0000 mg | ORAL_TABLET | Freq: Two times a day (BID) | ORAL | Status: DC
  Administered 2020-03-26 – 2020-03-27 (×2): 25 mg via ORAL
  Filled 2020-03-26 (×2): qty 1

## 2020-03-26 MED ORDER — HYDROXYZINE HCL 25 MG PO TABS
ORAL_TABLET | ORAL | Status: AC
  Filled 2020-03-26: qty 1

## 2020-03-26 MED ORDER — HEPARIN SODIUM (PORCINE) 1000 UNIT/ML DIALYSIS
1000.0000 [IU] | INTRAMUSCULAR | Status: DC | PRN
  Filled 2020-03-26: qty 1

## 2020-03-26 MED ORDER — LIDOCAINE HCL (PF) 1 % IJ SOLN: 5.0000 mL | INTRAMUSCULAR | Status: DC | PRN

## 2020-03-26 MED ORDER — SODIUM CHLORIDE 0.9 % IV SOLN: 100.0000 mL | INTRAVENOUS | Status: DC | PRN

## 2020-03-26 MED ORDER — APIXABAN 5 MG PO TABS
5.0000 mg | ORAL_TABLET | Freq: Two times a day (BID) | ORAL | Status: DC
  Administered 2020-03-26 – 2020-04-01 (×12): 5 mg via ORAL
  Filled 2020-03-26 (×12): qty 1

## 2020-03-26 MED ORDER — LIDOCAINE-PRILOCAINE 2.5-2.5 % EX CREA
1.0000 "application " | TOPICAL_CREAM | CUTANEOUS | Status: DC | PRN
  Filled 2020-03-26: qty 5

## 2020-03-26 MED ORDER — ACETAMINOPHEN 500 MG PO TABS
ORAL_TABLET | ORAL | Status: AC
  Filled 2020-03-26: qty 1

## 2020-03-26 NOTE — ED Notes (Signed)
Attending MD assessed Pt. I spoke to her about discontinuing the cardizem drip. She stated she would contact cardiology and let us know

## 2020-03-26 NOTE — ED Notes (Signed)
Paged attending after receiving critical results

## 2020-03-26 NOTE — ED Notes (Signed)
[place] [phone]-per Wille Glaser, RN paged by Levada Dy

## 2020-03-26 NOTE — ED Notes (Signed)
Gone to dialysis 

## 2020-03-26 NOTE — Progress Notes (Signed)
Pt HR continues to be in 130s. Sarajane Jews, NP paged. Order received for EKG and continue Cardizem gtt at 32ml/hr. Pt resting in bed. Asymptomatic. BP 104/77

## 2020-03-26 NOTE — Progress Notes (Signed)
  [facility] Progress Note   Subjective:  Seen in HD unit. K+ up this am. Dialyzing on low K bath to start HR 120-130s. No cp, sob at present.   Objective Vitals:   03/26/20 0915 03/26/20 0950 03/26/20 1000 03/26/20 1030  BP: 116/88 (!) 119/56 115/83 (!) 110/53  Pulse:  90 93 77  Resp: (!) 27     Temp:  (!) 96.3 F (35.7 C)    TempSrc:  Axillary    SpO2:  97%    Weight:      Height:         Additional Objective Labs: Basic Metabolic Panel: Recent Labs  Lab 03/25/20 0957 03/26/20 0820  NA 136 125*  K 5.8* 7.3*  CL 91* 85*  CO2 27 18*  GLUCOSE 105* 291*  BUN 80* 95*  CREATININE 9.74* 10.58*  CALCIUM 10.2 9.7  PHOS  --  8.0*   CBC: Recent Labs  Lab 03/25/20 0957 03/26/20 0820  WBC 5.4 9.4  NEUTROABS 4.0  --   HGB 10.2* 10.2*  HCT 35.2* 35.2*  MCV 96.2 95.4  PLT 244 275   Blood Culture    Component Value Date/Time   SDES BLOOD SITE NOT SPECIFIED 03/25/2020 1008   SPECREQUEST  03/25/2020 1008    BOTTLES DRAWN AEROBIC AND ANAEROBIC Blood Culture results may not be optimal due to an inadequate volume of blood received in culture bottles   CULT  03/25/2020 1008    NO GROWTH < 24 HOURS Performed at [facility], [place] [address] [place], [place] [address]    REPTSTATUS PENDING 03/25/2020 1008     Physical Exam General: Chronically ill appearing  Heart: Tachy, irregular  Lungs: Fine crackles at bases  Abdomen: soft non -tender  Extremities: no LE edema  Dialysis Access: LUE AVF +bruit   Medications: . [START ON 03/27/2020] sodium chloride    . [START ON 03/27/2020] sodium chloride    . diltiazem (CARDIZEM) infusion 15 mg/hr (03/25/20 2111)   . aspirin  81 mg Oral Daily  . heparin  5,000 Units Subcutaneous Q8H  . nicotine  14 mg Transdermal Daily  . pantoprazole  40 mg Oral Daily  . sevelamer carbonate  2,400 mg Oral TID with meals    Dialysis Orders:  AF MWF 4h 90min 500/800 66.5kg 1K/2.5Ca  AVF No heparin Hectorol 3 TIW  Parsabiv 15 Mircera 225 (last 11/8)   Assessment/Plan: 1. AFib w RVR on admission --On Cardizem gtt --per primary 2. ESRD -  HD MWF. Missed HD Wed. Truncates all treatments as outpatient. HD off schedule today.  Back on schedule Friday.  3. Hyperkalemia -chronic issue. On 1K bath + weekly Veltassa as outpatient. Loklema ordered here. Should correct with HD today. Follow lVA.  4. Hypertension/volume  - BP ok. Chronic volume overload. Likely pulm edema on CXR. 4L UF today 5.  Anemia  - Hgb 10.2. Recent ESA as outpatient. Follow. H/o GI bleeding -to see GI as outpatient  6.  Metabolic bone disease -  Continue binders, Hectorol. Parsabiv not on [place] PA-C [facility] 03/26/2020,11:46 AM

## 2020-03-26 NOTE — ED Notes (Signed)
The pt will not keep his ekg leads his pulse ox or his bp cuff in place has been explained to several times

## 2020-03-26 NOTE — Progress Notes (Addendum)
   Notified by RN that heart rates continue to remain in the 130's on IV Cardizem drip 88mL/hr. Reviewed chart and there was concern for atrial flutter. Ordered EKG which does appear to show 2:1 atrial flutter with rates in the 130's (reviewed with Dr. Harrell Gave who agrees). I suspect rate control will be very difficult. Ultimately may need TEE/DCCV. Will order Lopressor 25mg  twice daily. Continue IV Cardizem drip. Patient has not been on anticoagulation so will try to avoid antiarrhythmic at this time. Will start Eliquis 5mg  twice daily.   Darreld Mclean, PA-C 03/26/2020 5:27 PM

## 2020-03-26 NOTE — Progress Notes (Signed)
Pt arrived from ED via stretcher on Cardizem drip. Report from ED RN, Joe."Stop drip is SBP <90 or HR <60. HD tx initiated and 15 mins into tx, pt HR 58. Drip off and notified ED physician. Now pt's HR 119. Physician states, "Leave it off and I'll get back to you"

## 2020-03-26 NOTE — ED Notes (Signed)
THE DIALYSIS UNIT WANTS THE PT TO BE SWABBED.  HE WAS POSITIVE FOR COVID October THE 19TH   NO SWAB DONE TODAY BECAUSE HE WILL STILL BE POSITIVE  DIALYSIS WANTS A COVID SWAB ANYWAY BEFORE THEY DIALYZE

## 2020-03-26 NOTE — ED Notes (Signed)
Offered pt tylenol pt refused tylenol and pt stated "that wont fucking help me". Md aware

## 2020-03-26 NOTE — Progress Notes (Signed)
Inpatient Diabetes Program Recommendations  AACE/ADA: New Consensus Statement on Inpatient Glycemic Control (2015)  Target Ranges:  Prepandial:   less than 140 mg/dL      Peak postprandial:   less than 180 mg/dL (1-2 hours)      Critically ill patients:  140 - 180 mg/dL   Lab Results  Component Value Date   GLUCAP 266 (H) 03/26/2020   HGBA1C 5.4 11/04/2011    Review of Glycemic Control Results for Jeremy Mccoy, Jeremy Mccoy (MRN [id number]) as of 03/26/2020 11:10  Ref. Range 03/26/2020 08:20  Glucose-Capillary Latest Ref Range: 70 - 99 mg/dL 266 (H)   Diabetes history:  None listed ESRD/HD Outpatient Diabetes medications:  None Current orders for Inpatient glycemic control:  None  Inpatient Diabetes Program Recommendations:     Please check cbg's AC/HS  If cbg's remain > 180 mg/dL please consider glycemic control order set.  Will continue to follow while inpatient.  Thank you, Reche Dixon, RN, BSN Diabetes Coordinator Inpatient Diabetes Program [phone] (team pager from 8a-5p)

## 2020-03-26 NOTE — Progress Notes (Addendum)
Progress Note  Patient Name: Jeremy Mccoy Date of Encounter: 03/26/2020  Primary Cardiologist: New   Subjective   Seen in HD with no complaints. Remains tachycardiac  Inpatient Medications    Scheduled Meds: . aspirin  81 mg Oral Daily  . heparin  5,000 Units Subcutaneous Q8H  . nicotine  14 mg Transdermal Daily  . pantoprazole  40 mg Oral Daily  . sevelamer carbonate  2,400 mg Oral TID with meals   Continuous Infusions: . [START ON 03/27/2020] sodium chloride    . [START ON 03/27/2020] sodium chloride    . diltiazem (CARDIZEM) infusion 15 mg/hr (03/25/20 2111)   PRN Meds: [START ON 03/27/2020] sodium chloride, [START ON 03/27/2020] sodium chloride, acetaminophen, albuterol, [START ON 03/27/2020] alteplase, famotidine, [START ON 03/27/2020] heparin, hydrOXYzine, [START ON 03/27/2020] lidocaine (PF), [START ON 03/27/2020] lidocaine-prilocaine, [START ON 03/27/2020] pentafluoroprop-tetrafluoroeth, sevelamer carbonate   Vital Signs    Vitals:   03/26/20 0915 03/26/20 0950 03/26/20 1000 03/26/20 1030  BP: 116/88 (!) 119/56 115/83 (!) 110/53  Pulse:  90 93 77  Resp: (!) 27     Temp:  (!) 96.3 F (35.7 C)    TempSrc:  Axillary    SpO2:  97%    Weight:      Height:       No intake or output data in the 24 hours ending 03/26/20 1042 Filed Weights   03/26/20 0716  Weight: 65.5 kg    Physical Exam   General: Older than stated age, NAD Neck: Negative for carotid bruits. No JVD Lungs:Clear to ausculation bilaterally. No wheezes, rales, or rhonchi. Breathing is unlabored. Cardiovascular: RRR with S1 S2. No murmurs Abdomen: Soft, non-tender, non-distended. No obvious abdominal masses. Extremities: No edema. Radial pulses 2+ bilaterally Neuro: Alert and oriented. No focal deficits. No facial asymmetry. MAE spontaneously. Psych: Responds to questions appropriately with normal affect.    Labs    Chemistry Recent Labs  Lab 03/25/20 0957 03/26/20 0820  NA  136 125*  K 5.8* 7.3*  CL 91* 85*  CO2 27 18*  GLUCOSE 105* 291*  BUN 80* 95*  CREATININE 9.74* 10.58*  CALCIUM 10.2 9.7  PROT 6.9  --   ALBUMIN 2.8* 2.8*  AST 21  --   ALT 26  --   ALKPHOS 162*  --   BILITOT 0.8  --   GFRNONAA 6* 5*  ANIONGAP 18* 22*     Hematology Recent Labs  Lab 03/25/20 0957 03/26/20 0820  WBC 5.4 9.4  RBC 3.66* 3.69*  HGB 10.2* 10.2*  HCT 35.2* 35.2*  MCV 96.2 95.4  MCH 27.9 27.6  MCHC 29.0* 29.0*  RDW 21.8* 22.5*  PLT 244 275    Cardiac EnzymesNo results for input(s): TROPONINI in the last 168 hours. No results for input(s): TROPIPOC in the last 168 hours.   BNPNo results for input(s): BNP, PROBNP in the last 168 hours.   DDimer No results for input(s): DDIMER in the last 168 hours.   Radiology    DG Chest Port 1 View  Result Date: 03/25/2020 CLINICAL DATA:  Chest pain, shortness of breath. EXAM: PORTABLE CHEST 1 VIEW COMPARISON:  01/20/2020. FINDINGS: Improved consolidation in the right upper lobe, compatible with improved pneumonia. Increased diffuse interstitial prominence, which may represent interstitial pulmonary edema versus atypical infection. Similar cardiomediastinal silhouette. Please see recent CT chest for characterization of conglomerate mediastinal and perihilar lymphadenopathy. No visible pleural effusions or pneumothorax. IMPRESSION: 1. Improved consolidation in the right upper  lobe, compatible with improved pneumonia. Recommend continued imaging follow-up to resolution. 2. Increased diffuse interstitial prominence, which may represent interstitial pulmonary edema versus atypical infection. 3. Please see recent CT chest for characterization of conglomerate mediastinal and perihilar lymphadenopathy. Electronically Signed   By: Margaretha Sheffield MD   On: 03/25/2020 10:34   Telemetry    03/26/20 Atrial flutter versus ST rates in the 120's- Personally Reviewed  ECG    No new tracing as of 03/26/20- Personally  Reviewed  Cardiac Studies   Echocardiogram 01/23/20:  1. Left ventricular ejection fraction, by estimation, is 50 to 55%. The  left ventricle has low normal function. The left ventricle has no regional  wall motion abnormalities. There is mild left ventricular hypertrophy.  Left ventricular diastolic function  could not be evaluated.  2. Right ventricular systolic function is normal. The right ventricular  size is normal. There is moderately elevated pulmonary artery systolic  pressure.  3. Left atrial size was severely dilated.  4. Right atrial size was mildly dilated.  5. A small pericardial effusion is present.  6. The mitral valve is abnormal. Mild mitral valve regurgitation.  Moderate mitral stenosis. Severe mitral annular calcification.  7. The aortic valve is tricuspid. There is mild calcification of the  aortic valve. There is mild thickening of the aortic valve. Aortic valve  regurgitation is trivial. Mild aortic valve sclerosis is present, with no  evidence of aortic valve stenosis.  8. The inferior vena cava is dilated in size with <50% respiratory  variability, suggesting right atrial pressure of 15 mmHg.   Comparison(s): Changes from prior study are noted.   Conclusion(s)/Recommendation(s): Severely calcified mitral valve with at  least moderate mitral stenosis. Highly variable heart rate between 90-120  bpm.   Patient Profile     54 y.o. male with a hx of COPD with ongoing tobacco use, hx of cocaine use, HTN, ESRD on HD (MWF) and recent COVID/PNA infection who is being seen today for the evaluation of atrial fibrillation with RVR at the request of Dr. Rock Nephew.  Assessment & Plan    1. Possible atrial fibrillation with RVR: -He presented to [facility] with presumed AF with RVR however rthythm was unresponsive to diltiazem infusion and IV metoprolol although was found to be regular therefore suspect either Atrial flutter or ST.   -EKG appears to be ST versus atrial  flutter>>will discuss with MD -Last echocardiogram 01/2020 with EF at 50-55% with no RWMA, mod PAH, severely dilated LA and mildly dilated RA, small pericardial effusion, mild MR and mod MS with severe mitral annular calcifications.  -Currently undergoing HD -Hb stable therefore if AF>>>can start [facility] with Eliquis?  -Diltiazem stopped at HD due to SB with rate in the 50's>>>upon stopping the infusion, rates returned to the low 100's>>restart for now and will obtain EKG once done with HD   2. Mod mitral stenosis with severe annular calcifications: -Per echocardiogram 01/23/20 showed EF at 50-55% with no RWMA, mod PAH, severely dilated LA and mildly dilated RA, small pericardial effusion, mild MR and mod MS with severe mitral annular calcifications.  -No progressive disease   3. Atypical chest pain: -Reports two brief non-exertional episodes of chest pain>>felt to be atypical -hsT at 64>>>58, not consistent with ACS -EKG with no acute changes  -Follow for now and continue ASA  4. ESRD: -On HD MWF although not very complaint per nephrology notation -Management per nephrology   5. Chronic CHF: -EF stable per echo 01/2020 at 50-55% -CXR  with increased diffuse interstitial prominence, which may represent interstitial pulmonary edema versus atypical infection when compared to prior imaging.    6. Ongoing tobacco and cocaine use: -Cessation encouraged    Signed, Kathyrn Drown NP-C [place] Pager: [phone] 03/26/2020, 10:42 AM     Patient seen and examined while in hemodialysis today. Agree with assessment and plan.  No chest pain.  Apparently, the patient's heart rate had reduced to 58 in sinus rhythm and his Cardizem drip was discontinued.  Heart rate now is back to 130 with regular tachycardia, cannot rule out atrial flutter.  Will resume Cardizem drip at 10 mg.  Blood pressure currently stable at approximately 646-803 systolic during dialysis.  No chest pain.  We will repeat ECG  later today.   Troy Sine, MD, [facility] 03/26/2020 12:41 PM  For questions or updates, please contact   Please consult [email or web address] for contact info under Cardiology/STEMI.

## 2020-03-26 NOTE — Progress Notes (Signed)
   03/26/20 1501  Assess: MEWS Score  Temp 97.6 F (36.4 C)  BP 101/84  Pulse Rate 87  ECG Heart Rate (!) 136  Resp (!) 22  Level of Consciousness Alert  SpO2 98 %  O2 Device Nasal Cannula  Patient Activity (if Appropriate) In bed  O2 Flow Rate (L/min) 2 L/min  Assess: MEWS Score  MEWS Temp 0  MEWS Systolic 0  MEWS Pulse 3  MEWS RR 1  MEWS LOC 0  MEWS Score 4  MEWS Score Color Red  Assess: if the MEWS score is Yellow or Red  Were vital signs taken at a resting state? Yes  Focused Assessment No change from prior assessment  Early Detection of Sepsis Score *See Row Information* Medium  MEWS guidelines implemented *See Row Information* Yes  Treat  MEWS Interventions Administered scheduled meds/treatments  Pain Scale 0-10  Pain Score 0  Take Vital Signs  Increase Vital Sign Frequency  Red: Q 1hr X 4 then Q 4hr X 4, if remains red, continue Q 4hrs  Escalate  MEWS: Escalate Red: discuss with charge nurse/RN and provider, consider discussing with RRT  Notify: Charge Nurse/RN  Name of Charge Nurse/RN Notified Jessica, RN  Date Charge Nurse/RN Notified 03/26/20  Time Charge Nurse/RN Notified [id number]  Notify: Provider  Provider Name/Title Mancel Bale, NP   Date Provider Notified 03/26/20  Time Provider Notified 1521  Notification Type Page  Notification Reason Other (Comment) (HR in 130's)  Document  Patient Outcome Not stable and remains on department  Progress note created (see row info) Yes  MEWs protocol implemented.

## 2020-03-26 NOTE — ED Notes (Signed)
Spoke to attending MD who stated that they had notified dialysis of same. Dialysis stated that they would get to  [facility]im as soon as possible

## 2020-03-26 NOTE — Progress Notes (Addendum)
Family Medicine Teaching Service Daily Progress Note Intern Pager: [phone]  Patient name: DARCY CORDNER Medical record number: [id number] Date of birth: [birth year] Age: 54 y.o. Gender: male  Primary Care Provider: Patient, No Pcp Per Consultants: Cardiology, Nephrology Code Status: FULL  Pt Overview and Major Events to Date:   11/10: admitted  Assessment and Plan:  SHAMAR ENGELMANN is a 54 y.o. male presenting with SOB and dizziness. PMH is significant for ESRD on HD MWF, a-fib with RVR, HTN, COPD, CHF, tobacco use, prior cocaine abuse, recent COVID, and medication noncompliance.   Sinus Tachycardia vs. Paroxysmal A-Fib with RVR EKG showing sinus tachycardia. Patient's HR has improved this morning to 90s and low 100s. He remains on the diltiazem drip currently. -cardiology following, appreciate recommendations -likely d/c cardizem drip, but will await cards recs   ESRD on HD MWF  Hyperkalemia Patient's potassium 7.3 this morning. Cr 10.58. Given lokelma x1 yesterday for hyperkalemia until patient could get dialysis this morning. -plan for HD this morning -nephro following, appreciate assistance -daily renal function panel  HTN BP has been labile over past 24h. Ranging from 96/84-177/156. -cardiology following, appreciate recommendations -holding home amlodipine for now  COPD  Tobacco Use Disorder Stable on 3L O2. States his breathing feels a bit better currently. -Albuterol neb q6h prn -Nicotine patch 15mg  daily  Hx of Anemia Hgb 10.2 this morning, unchanged from admission. Denies any blood in stool or elsewhere. Of note, patient required blood transfusion during previous hospitalization in September due to severe anemia. He refused GI workup at that time. -Daily CBC  HFmrEF Patient likely volume overloaded due to missed HD yesterday. Stable on 2L [place]. Echo 01/2020 showed EF 50-55%. -Plan for HD today -Continue to monitor respiratory status -Supplemental O2 as  needed, goal SpO2 >88%   Hyperglycemia Patient's glucose elevated to 291. No known hx of diabetes.  -Will check A1c   FEN/GI: Renal diet PPx: Heparin   Status is: Observation The patient remains OBS appropriate and will d/c before 2 midnights.  Dispo: The patient is from: Home              Anticipated d/c is to: Home              Anticipated d/c date is: 1 day              Patient currently is not medically stable to d/c.   Subjective:  Patient feels like "everything is going right through me". He has been sitting on the bedside commode all morning. Denies blood in his stool. Still complaining of dizziness (lightheadedness) and fatigue. Denies SOB or chest pain this morning. Patient states he needs to get to dialysis and knows he will feel much better after dialysis.  Objective: Temp:  [96.3 F (35.7 C)-97.8 F (36.6 C)] 96.3 F (35.7 C) (11/11 0950) Pulse Rate:  [33-139] 77 (11/11 1030) Resp:  [7-40] 27 (11/11 0915) BP: (96-177)/(53-156) 110/53 (11/11 1030) SpO2:  [88 %-100 %] 97 % (11/11 0950) Weight:  [65.5 kg] 65.5 kg (11/11 0716) Physical Exam: General: alert, elderly appearing, NAD Cardiovascular: RRR, normal S1/S2 without m/r/g Respiratory: normal WOB on 3L, bibasilar crackles Abdomen: soft, mild epigastric tenderness, otherwise nontender, nondistended Extremities: no peripheral edema  Laboratory: Recent Labs  Lab 03/25/20 0957 03/26/20 0820  WBC 5.4 9.4  HGB 10.2* 10.2*  HCT 35.2* 35.2*  PLT 244 275   Recent Labs  Lab 03/25/20 0957 03/26/20 0820  NA 136 125*  K 5.8* 7.3*  CL 91* 85*  CO2 27 18*  BUN 80* 95*  CREATININE 9.74* 10.58*  CALCIUM 10.2 9.7  PROT 6.9  --   BILITOT 0.8  --   ALKPHOS 162*  --   ALT 26  --   AST 21  --   GLUCOSE 105* 291*    Imaging/Diagnostic Tests: No new imaging/diagnostic tests  Alcus Dad, MD 03/26/2020, 11:14 AM PGY-1, [place] Intern pager: [phone], text pages welcome

## 2020-03-26 NOTE — ED Notes (Signed)
Intern just left bedside

## 2020-03-27 DIAGNOSIS — Z5181 Encounter for therapeutic drug level monitoring: Secondary | ICD-10-CM | POA: Diagnosis not present

## 2020-03-27 DIAGNOSIS — Z7901 Long term (current) use of anticoagulants: Secondary | ICD-10-CM | POA: Diagnosis not present

## 2020-03-27 DIAGNOSIS — E875 Hyperkalemia: Secondary | ICD-10-CM

## 2020-03-27 DIAGNOSIS — N186 End stage renal disease: Secondary | ICD-10-CM | POA: Diagnosis not present

## 2020-03-27 DIAGNOSIS — Z79899 Other long term (current) drug therapy: Secondary | ICD-10-CM

## 2020-03-27 DIAGNOSIS — R0602 Shortness of breath: Secondary | ICD-10-CM | POA: Diagnosis not present

## 2020-03-27 DIAGNOSIS — I4892 Unspecified atrial flutter: Secondary | ICD-10-CM | POA: Diagnosis not present

## 2020-03-27 LAB — CBC
HCT: 33.5 % — ABNORMAL LOW (ref 39.0–52.0)
Hemoglobin: 9.9 g/dL — ABNORMAL LOW (ref 13.0–17.0)
MCH: 27.7 pg (ref 26.0–34.0)
MCHC: 29.6 g/dL — ABNORMAL LOW (ref 30.0–36.0)
MCV: 93.6 fL (ref 80.0–100.0)
Platelets: 236 10*3/uL (ref 150–400)
RBC: 3.58 MIL/uL — ABNORMAL LOW (ref 4.22–5.81)
RDW: 22.4 % — ABNORMAL HIGH (ref 11.5–15.5)
WBC: 6.4 10*3/uL (ref 4.0–10.5)
nRBC: 0.9 % — ABNORMAL HIGH (ref 0.0–0.2)

## 2020-03-27 LAB — RENAL FUNCTION PANEL
Albumin: 2.5 g/dL — ABNORMAL LOW (ref 3.5–5.0)
Anion gap: 16 — ABNORMAL HIGH (ref 5–15)
BUN: 51 mg/dL — ABNORMAL HIGH (ref 6–20)
CO2: 25 mmol/L (ref 22–32)
Calcium: 10 mg/dL (ref 8.9–10.3)
Chloride: 94 mmol/L — ABNORMAL LOW (ref 98–111)
Creatinine, Ser: 6.91 mg/dL — ABNORMAL HIGH (ref 0.61–1.24)
GFR, Estimated: 9 mL/min — ABNORMAL LOW (ref >60)
Glucose, Bld: 109 mg/dL — ABNORMAL HIGH (ref 70–99)
Phosphorus: 6.1 mg/dL — ABNORMAL HIGH (ref 2.5–4.6)
Potassium: 4.9 mmol/L (ref 3.5–5.1)
Sodium: 135 mmol/L (ref 135–145)

## 2020-03-27 LAB — HEMOGLOBIN A1C
Hgb A1c MFr Bld: 5.1 % (ref 4.8–5.6)
Mean Plasma Glucose: 99.67 mg/dL

## 2020-03-27 MED ORDER — AMIODARONE LOAD VIA INFUSION
150.0000 mg | Freq: Once | INTRAVENOUS | Status: AC
  Administered 2020-03-27: 150 mg via INTRAVENOUS
  Filled 2020-03-27: qty 83.34

## 2020-03-27 MED ORDER — AMIODARONE HCL IN DEXTROSE 360-4.14 MG/200ML-% IV SOLN
30.0000 mg/h | INTRAVENOUS | Status: DC
  Administered 2020-03-27 – 2020-04-01 (×10): 30 mg/h via INTRAVENOUS
  Filled 2020-03-27 (×10): qty 200

## 2020-03-27 MED ORDER — AMIODARONE HCL IN DEXTROSE 360-4.14 MG/200ML-% IV SOLN
60.0000 mg/h | INTRAVENOUS | Status: AC
  Administered 2020-03-27: 60 mg/h via INTRAVENOUS
  Filled 2020-03-27 (×4): qty 200

## 2020-03-27 NOTE — Progress Notes (Addendum)
Progress Note  Patient Name: Jeremy Mccoy Date of Encounter: 03/27/2020  [facility] Cardiologist: Fransico Him, MD   Subjective   HR 130. Asymptomatic. Seen during dialysis. No chest pain or palpitations.   Inpatient Medications    Scheduled Meds: . amiodarone  150 mg Intravenous Once  . apixaban  5 mg Oral BID  . aspirin  81 mg Oral Daily  . metoprolol tartrate  25 mg Oral BID  . nicotine  14 mg Transdermal Daily  . pantoprazole  40 mg Oral Daily  . sevelamer carbonate  2,400 mg Oral TID with meals   Continuous Infusions: . sodium chloride    . sodium chloride    . amiodarone     Followed by  . amiodarone     PRN Meds: sodium chloride, sodium chloride, acetaminophen, albuterol, alteplase, famotidine, heparin, hydrOXYzine, lidocaine (PF), lidocaine-prilocaine, pentafluoroprop-tetrafluoroeth, sevelamer carbonate   Vital Signs    Vitals:   03/27/20 0830 03/27/20 0849 03/27/20 0900 03/27/20 0905  BP: (!) 75/64 99/76 (!) 95/54 102/60  Pulse: (!) 139 (!) 137 (!) 128 69  Resp: (!) 31 (!) 26 (!) 28 17  Temp:      TempSrc:      SpO2: 94% 99% 96% 93%  Weight:      Height:        Intake/Output Summary (Last 24 hours) at 03/27/2020 0925 Last data filed at 03/27/2020 0600 Gross per 24 hour  Intake 792.26 ml  Output 3513 ml  Net -2720.74 ml   Last 3 Weights 03/27/2020 03/27/2020 03/26/2020  Weight (lbs) 148 lb 13 oz 146 lb 1.6 oz 144 lb 3.2 oz  Weight (kg) 67.5 kg 66.271 kg 65.409 kg      Telemetry    Atrial flutter at 130s- Personally Reviewed  ECG    N/A  Physical Exam   GEN: No acute distress.   Neck: No JVD Cardiac: Regular tachycardic, no murmurs, rubs, or gallops.  Respiratory: Clear to auscultation bilaterally. GI: Soft, nontender, non-distended  MS: No edema; No deformity. Neuro:  Nonfocal  Psych: Normal affect   Labs    High Sensitivity Troponin:   Recent Labs  Lab 03/25/20 0957 03/25/20 1505  TROPONINIHS 64* 58*       Chemistry Recent Labs  Lab 03/25/20 0957 03/26/20 0820 03/27/20 0255  NA 136 125* 135  K 5.8* 7.3* 4.9  CL 91* 85* 94*  CO2 27 18* 25  GLUCOSE 105* 291* 109*  BUN 80* 95* 51*  CREATININE 9.74* 10.58* 6.91*  CALCIUM 10.2 9.7 10.0  PROT 6.9  --   --   ALBUMIN 2.8* 2.8* 2.5*  AST 21  --   --   ALT 26  --   --   ALKPHOS 162*  --   --   BILITOT 0.8  --   --   GFRNONAA 6* 5* 9*  ANIONGAP 18* 22* 16*     Hematology Recent Labs  Lab 03/25/20 0957 03/26/20 0820 03/27/20 0255  WBC 5.4 9.4 6.4  RBC 3.66* 3.69* 3.58*  HGB 10.2* 10.2* 9.9*  HCT 35.2* 35.2* 33.5*  MCV 96.2 95.4 93.6  MCH 27.9 27.6 27.7  MCHC 29.0* 29.0* 29.6*  RDW 21.8* 22.5* 22.4*  PLT 244 275 236     Radiology    DG Chest Port 1 View  Result Date: 03/25/2020 CLINICAL DATA:  Chest pain, shortness of breath. EXAM: PORTABLE CHEST 1 VIEW COMPARISON:  01/20/2020. FINDINGS: Improved consolidation in the right upper lobe, compatible  with improved pneumonia. Increased diffuse interstitial prominence, which may represent interstitial pulmonary edema versus atypical infection. Similar cardiomediastinal silhouette. Please see recent CT chest for characterization of conglomerate mediastinal and perihilar lymphadenopathy. No visible pleural effusions or pneumothorax. IMPRESSION: 1. Improved consolidation in the right upper lobe, compatible with improved pneumonia. Recommend continued imaging follow-up to resolution. 2. Increased diffuse interstitial prominence, which may represent interstitial pulmonary edema versus atypical infection. 3. Please see recent CT chest for characterization of conglomerate mediastinal and perihilar lymphadenopathy. Electronically Signed   By: Margaretha Sheffield MD   On: 03/25/2020 10:34    Cardiac Studies   Echocardiogram 01/23/20:  1. Left ventricular ejection fraction, by estimation, is 50 to 55%. The  left ventricle has low normal function. The left ventricle has no regional  wall  motion abnormalities. There is mild left ventricular hypertrophy.  Left ventricular diastolic function  could not be evaluated.  2. Right ventricular systolic function is normal. The right ventricular  size is normal. There is moderately elevated pulmonary artery systolic  pressure.  3. Left atrial size was severely dilated.  4. Right atrial size was mildly dilated.  5. A small pericardial effusion is present.  6. The mitral valve is abnormal. Mild mitral valve regurgitation.  Moderate mitral stenosis. Severe mitral annular calcification.  7. The aortic valve is tricuspid. There is mild calcification of the  aortic valve. There is mild thickening of the aortic valve. Aortic valve  regurgitation is trivial. Mild aortic valve sclerosis is present, with no  evidence of aortic valve stenosis.  8. The inferior vena cava is dilated in size with <50% respiratory  variability, suggesting right atrial pressure of 15 mmHg.   Comparison(s): Changes from prior study are noted.   Conclusion(s)/Recommendation(s): Severely calcified mitral valve with at  least moderate mitral stenosis. Highly variable heart rate between 90-120  bpm.   Patient Profile     54 y.o. male with a hx of COPD with ongoing tobacco use, hx of cocaine use, HTN, ESRD on HD (MWF) and recentCOVID/PNAinfection seen for afib/aflutter RVR.     Assessment & Plan    1. Atrial flutter with RVR - Initial rhythm concerning for atrial flutter vs sinus tachycardia. Not responded to IV Cardizem. Apparently, the patient's heart rate had reduced to 58 in sinus rhythm and his Cardizem drip was discontinued. However had recurrent elevated HR in 130s. Restarted IV cardizem. Yesterday his strip reviewed by Dr. Harrell Gave and felt more aflutter and started on Eliquis for anticoagulation. CHADSVASCs score is 1 for HTN - this morning his cardizem stopped 2nd to soft blood pressure. Review of tele clearly showed atrial flutter when HR was  in 100s around 5am this morning.  - Stop cardizem given soft BP - Start Amiodarone with load - Continue BB - He is asymptomatic - TEE/DCCV Monday or DCCV as outpatient after 3 weeks of anticoagulation (he is asymptomatic) . - Will decided long term anticoagulation in outpatient   2. Moderate mitral stenosis with severe annular calcifications: -Per echocardiogram 01/23/20 showed EF at 50-55% with no RWMA, mod PAH, severely dilated LA and mildly dilated RA, small pericardial effusion, mild MR and mod MS with severe mitral annular calcifications. -No progressive disease   Otherwise per primary team   For questions or updates, please contact [place] Please consult [email or web address] for contact info under       SignedLeanor Kail, PA  03/27/2020, 9:25 AM     Patient seen and examined. Agree with assessment and  plan.  Patient continues to be in probable atrial flutter at a rate of 135 bpm.  He has been receiving amiodarone infusion at 60 mg/h with plans to reduce to 6:30 hours.  In light of continued increased heart rate we will give a 150 mg IV amiodarone bolus now.  He is now on Eliquis for anticoagulation.  Depending upon blood pressure, low-dose beta-blocker may be able to be initiated on nondialysis days, but will not do presently.  No chest pain.  Once heart rate is stable, consider follow-up echo to reassess mitral stenosis.  We will check LFTs and thyroid function along with electrolytes in a.m.   Troy Sine, MD, [facility] 03/27/2020 1:07 PM

## 2020-03-27 NOTE — Progress Notes (Signed)
Family Medicine Teaching Service Daily Progress Note Intern Pager: [phone]  Patient name: Jeremy Mccoy Medical record number: [id number] Date of birth: 01-15-1966 Age: 54 y.o. Gender: male  Primary Care Provider: Patient, No Pcp Per Consultants: Cardiology, Nephrology Code Status: FULL  Pt Overview and Major Events to Date:   11/10: admitted, cardiology and nephrology consulted, diltiazem drip started 11/11: received HD  Assessment and Plan:  Jeremy Mccoy a 54 y.o.malepresenting with SOB and dizziness. PMH is significant forESRD on HD MWF, a-fibwith RVR,HTN,COPD, CHF, tobacco use,prior cocaine abuse,recent COVID,andmedication noncompliance.  A-Flutter with RVR vs. Sinus Tach vs. A-Fib with RVR Repeat EKG obtained yesterday after dialysis showed 2:1 atrial flutter with HR 130s. Cardizem drip was discontinued this morning due to soft BPs. His HR remains in the 130s. -Cardiology following, appreciate recommendations -Continue Metoprolol 25mg  BID per cardiology -Consider alternative to metoprolol on discharge due to cocaine abuse  -Will discuss anticoagulation as outpatient per cardiology -Possible TEE/DCCV as outpatient -Continue to monitor HR  ESRD on HD M/W/F  Hyperkalemia Patient received dialysis yesterday and feels much better today. His electrolytes are much improved this morning- K 4.9, Cr 6.91, Phosphorus 6.1. -Plan for HD today -Nephro following, appreciate recommendations  Hx of HTN BP has been low-normal over past 24h. Most recent 90/74. -Holding home amlodipine -Off cardizem drip for now -Continue to monitor BP closely  COPD  Tobacco Use Disorder Remains stable on 3L.  -Wean supplemental O2 as tolerated today -Goal SpO2 >88% -Nicotine patch  HFmrEF Patient denies dyspnea currently, although would like to see how he feels with exertion. Volume status improved after dialysis yesterday. -HD again today -Wean supplemental O2 as  tolerated -Renal diet, 1537mL fluid restriction  Hyperglycemia Patient's glucose yesterday morning was elevated to 291. Hemoglobin A1c wnl at 5.1%. -Continue to monitor  Hx of Anemia Hgb stable at 9.9 this morning. -Will continue to monitor due to hx of anemia requiring transfusions   FEN/GI: Renal, 1523mL fluid restriction PPx: Heparin   Status is: Inpatient Remains inpatient appropriate because:Persistent severe electrolyte disturbances   Dispo: The patient is from: Home              Anticipated d/c is to: Home              Anticipated d/c date is: 1 day              Patient currently is not medically stable to d/c.   Subjective:  Patient feeling much better today after receiving dialysis yesterday. Denies complaints currently. Would like to walk and see if he has dizziness or dyspnea.  Objective: Temp:  [96.3 F (35.7 C)-99 F (37.2 C)] 98.8 F (37.1 C) (11/12 0342) Pulse Rate:  [59-137] 78 (11/12 0543) Resp:  [17-31] 20 (11/12 0342) BP: (88-129)/(53-111) 100/73 (11/12 0543) SpO2:  [90 %-100 %] 91 % (11/12 0543) Weight:  [65.4 kg-66.3 kg] 66.3 kg (11/12 0342) Physical Exam: General: alert, NAD Cardiovascular: tachycardic, no m/r/g appreciated Respiratory: normal WOB on 2L, bibasilar crackles Abdomen: soft, nontender, nondistended Extremities: no peripheral edema  Laboratory: Recent Labs  Lab 03/25/20 0957 03/26/20 0820 03/27/20 0255  WBC 5.4 9.4 6.4  HGB 10.2* 10.2* 9.9*  HCT 35.2* 35.2* 33.5*  PLT 244 275 236   Recent Labs  Lab 03/25/20 0957 03/26/20 0820 03/27/20 0255  NA 136 125* 135  K 5.8* 7.3* 4.9  CL 91* 85* 94*  CO2 27 18* 25  BUN 80* 95* 51*  CREATININE 9.74* 10.58* 6.91*  CALCIUM 10.2 9.7 10.0  PROT 6.9  --   --   BILITOT 0.8  --   --   ALKPHOS 162*  --   --   ALT 26  --   --   AST 21  --   --   GLUCOSE 105* 291* 109*     Imaging/Diagnostic Tests: No new imaging/diagnostic tests   Alcus Dad, MD 03/27/2020, 6:21  AM PGY-1, [place] Intern pager: [phone], text pages welcome

## 2020-03-27 NOTE — Progress Notes (Signed)
Per night shift RN Sunny, patient's heartrate was controlled overnight with Cardizem drip at 15 mg/hr, but drip was stopped per protocol around 0520 secondary to hypotension. She states she did not receive a response from the physician she paged. This RN paged and notified Bhagat, PA-C with Cardiology, who stated he will review. Patient currently in Hemodialysis.

## 2020-03-27 NOTE — Progress Notes (Signed)
   03/27/20 1125  Vitals  Temp 97.9 F (36.6 C)  Temp Source Oral  BP 102/75  BP Location Right Arm  BP Method Automatic  Patient Position (if appropriate) Lying  Pulse Rate (!) 136  ECG Heart Rate (!) 136  Resp (!) 32  Oxygen Therapy  SpO2 95 %  O2 Device Nasal Cannula  O2 Flow Rate (L/min) 2 L/min  Pain Assessment  Pain Scale 0-10  Pain Score 0  Dialysis Weight  Weight 66.6 kg (standing)  Type of Weight Post-Dialysis  Post-Hemodialysis Assessment  Rinseback Volume (mL) 250 mL  KECN 282 V  Dialyzer Clearance Lightly streaked  Duration of HD Treatment -hour(s) 3.5 hour(s)  Hemodialysis Intake (mL) 600 mL  UF Total -Machine (mL) 785 mL  Net UF (mL) 185 mL  Tolerated HD Treatment No (Comment) (see progress noted)  Post-Hemodialysis Comments tx complete-pt stable  AVG/AVF Arterial Site Held (minutes) 5 minutes  AVG/AVF Venous Site Held (minutes) 5 minutes  Fistula / Graft Left Forearm Arteriovenous fistula  Placement Date/Time: 11/29/11 0856   Placed prior to admission: No  Orientation: Left  Access Location: Forearm  Access Type: Arteriovenous fistula  Site Condition No complications  Fistula / Graft Assessment Present;Thrill;Bruit;Aneurism present  Status Deaccessed;Flushed;Patent  Drainage Description None  Pt noted with persistent tachycardia and hypotension throughout tx. HR running 120s-140s, SBP-70s-low 100s. Pt seen by cardiology and nephrology. Unable to pull fluid, Dr.Kruska on unit and made aware. Pt started on amiodarone gtt by primary nurse. HD tx complete-pt stable.

## 2020-03-27 NOTE — Progress Notes (Signed)
[facility] Progress Note   Subjective:  Seen in HD unit.  HR and BP down early AM so cardizem gtt stopped.  Lopressor and eliquis had been started yesterday. Asymptomatic low BP currently   Objective Vitals:   03/27/20 0830 03/27/20 0849 03/27/20 0900 03/27/20 0905  BP: (!) 75/64 99/76 (!) 95/54 102/60  Pulse: (!) 139 (!) 137 (!) 128 69  Resp: (!) 31 (!) 26 (!) 28 17  Temp:      TempSrc:      SpO2: 94% 99% 96% 93%  Weight:      Height:         Additional Objective Labs: Basic Metabolic Panel: Recent Labs  Lab 03/25/20 0957 03/26/20 0820 03/27/20 0255  NA 136 125* 135  K 5.8* 7.3* 4.9  CL 91* 85* 94*  CO2 27 18* 25  GLUCOSE 105* 291* 109*  BUN 80* 95* 51*  CREATININE 9.74* 10.58* 6.91*  CALCIUM 10.2 9.7 10.0  PHOS  --  8.0* 6.1*   CBC: Recent Labs  Lab 03/25/20 0957 03/26/20 0820 03/27/20 0255  WBC 5.4 9.4 6.4  NEUTROABS 4.0  --   --   HGB 10.2* 10.2* 9.9*  HCT 35.2* 35.2* 33.5*  MCV 96.2 95.4 93.6  PLT 244 275 236   Blood Culture    Component Value Date/Time   SDES BLOOD SITE NOT SPECIFIED 03/25/2020 1008   SPECREQUEST  03/25/2020 1008    BOTTLES DRAWN AEROBIC AND ANAEROBIC Blood Culture results may not be optimal due to an inadequate volume of blood received in culture bottles   CULT  03/25/2020 1008    NO GROWTH 2 DAYS Performed at [facility], [place] [address] [place], [place] [address]    REPTSTATUS PENDING 03/25/2020 1008     Physical Exam General: Chronically ill appearing  Heart: RRR Lungs: basilar rales persist Abdomen: soft non -tender  Extremities: no LE edema  Dialysis Access: LUE AVF +bruit  Qb 400  Medications: . sodium chloride    . sodium chloride    . diltiazem (CARDIZEM) infusion Stopped (03/27/20 0456)   . apixaban  5 mg Oral BID  . aspirin  81 mg Oral Daily  . metoprolol tartrate  25 mg Oral BID  . nicotine  14 mg Transdermal Daily  . pantoprazole  40 mg Oral Daily  . sevelamer carbonate   2,400 mg Oral TID with meals    Dialysis Orders:  AF MWF 4h 56min 500/800 66.5kg 1K/2.5Ca  AVF No heparin Hectorol 3 TIW Parsabiv 15 Mircera 225 (last 11/8)   Assessment/Plan: 1. AFib w RVR on admission -- tele yesterday with A flutter. Lopressor started yesterday, cardizem off in face of hypotension this AM.  Eliquis started. Per cardiology.  May require cardioversion or antiarrhythmic.  2. ESRD -  HD MWF. Missed HD Wed. Truncates all treatments as outpatient. HD off schedule Thurs.  Back on schedule tdoay.  May need extra UF tomorrow given rales and UF limited today by hypotension.  Will tentatively plan for it.  3. Hyperkalemia -chronic issue. On 1K bath + weekly Veltassa as outpatient. Loklema ordered here. Was 4.9 this AM. Follow. 4. Hypertension/volume  - BP ok. Chronic volume overload. Likely pulm edema on CXR. 3.5L UF yesterday, UF today limited by hypotension.  Below EDW - will need adjustment outpt.  5.  Anemia  - Hgb 10.2. Recent ESA as outpatient. Follow. H/o GI bleeding -to see GI as outpatient  6.  Metabolic bone disease -  Continue binders, Hectorol. Parsabiv not on hospital formulary   [place] Pager [phone]

## 2020-03-27 NOTE — Discharge Instructions (Signed)

## 2020-03-27 NOTE — Progress Notes (Signed)
At 0445: Heart rate is getting controlled between 90s and 110s. Patient is awake and BP checked: 88/62 (map: 72) and 89/73 (map: 81). Cardizem drips (15 mg/hr) was stopped. Dr. Paticia Stack notified.   Patient states that he feels much better and denies any discomfort at this time.  Will continue to assess.

## 2020-03-28 DIAGNOSIS — I4891 Unspecified atrial fibrillation: Secondary | ICD-10-CM | POA: Diagnosis not present

## 2020-03-28 DIAGNOSIS — I4892 Unspecified atrial flutter: Secondary | ICD-10-CM | POA: Diagnosis not present

## 2020-03-28 DIAGNOSIS — R0602 Shortness of breath: Secondary | ICD-10-CM | POA: Diagnosis not present

## 2020-03-28 LAB — COMPREHENSIVE METABOLIC PANEL
ALT: 154 U/L — ABNORMAL HIGH (ref 0–44)
AST: 87 U/L — ABNORMAL HIGH (ref 15–41)
Albumin: 2.6 g/dL — ABNORMAL LOW (ref 3.5–5.0)
Alkaline Phosphatase: 159 U/L — ABNORMAL HIGH (ref 38–126)
Anion gap: 17 — ABNORMAL HIGH (ref 5–15)
BUN: 41 mg/dL — ABNORMAL HIGH (ref 6–20)
CO2: 26 mmol/L (ref 22–32)
Calcium: 10.1 mg/dL (ref 8.9–10.3)
Chloride: 91 mmol/L — ABNORMAL LOW (ref 98–111)
Creatinine, Ser: 5.72 mg/dL — ABNORMAL HIGH (ref 0.61–1.24)
GFR, Estimated: 11 mL/min — ABNORMAL LOW (ref >60)
Glucose, Bld: 99 mg/dL (ref 70–99)
Potassium: 4.2 mmol/L (ref 3.5–5.1)
Sodium: 134 mmol/L — ABNORMAL LOW (ref 135–145)
Total Bilirubin: 0.7 mg/dL (ref 0.3–1.2)
Total Protein: 6.3 g/dL — ABNORMAL LOW (ref 6.5–8.1)

## 2020-03-28 LAB — RENAL FUNCTION PANEL
Albumin: 2.5 g/dL — ABNORMAL LOW (ref 3.5–5.0)
Anion gap: 15 (ref 5–15)
BUN: 38 mg/dL — ABNORMAL HIGH (ref 6–20)
CO2: 24 mmol/L (ref 22–32)
Calcium: 9.8 mg/dL (ref 8.9–10.3)
Chloride: 94 mmol/L — ABNORMAL LOW (ref 98–111)
Creatinine, Ser: 5.29 mg/dL — ABNORMAL HIGH (ref 0.61–1.24)
GFR, Estimated: 12 mL/min — ABNORMAL LOW (ref >60)
Glucose, Bld: 90 mg/dL (ref 70–99)
Phosphorus: 4.4 mg/dL (ref 2.5–4.6)
Potassium: 4.1 mmol/L (ref 3.5–5.1)
Sodium: 133 mmol/L — ABNORMAL LOW (ref 135–145)

## 2020-03-28 LAB — BASIC METABOLIC PANEL
Anion gap: 14 (ref 5–15)
BUN: 38 mg/dL — ABNORMAL HIGH (ref 6–20)
CO2: 25 mmol/L (ref 22–32)
Calcium: 9.8 mg/dL (ref 8.9–10.3)
Chloride: 94 mmol/L — ABNORMAL LOW (ref 98–111)
Creatinine, Ser: 5.41 mg/dL — ABNORMAL HIGH (ref 0.61–1.24)
GFR, Estimated: 12 mL/min — ABNORMAL LOW (ref >60)
Glucose, Bld: 89 mg/dL (ref 70–99)
Potassium: 4.1 mmol/L (ref 3.5–5.1)
Sodium: 133 mmol/L — ABNORMAL LOW (ref 135–145)

## 2020-03-28 LAB — CBC
HCT: 33.6 % — ABNORMAL LOW (ref 39.0–52.0)
Hemoglobin: 10 g/dL — ABNORMAL LOW (ref 13.0–17.0)
MCH: 27.9 pg (ref 26.0–34.0)
MCHC: 29.8 g/dL — ABNORMAL LOW (ref 30.0–36.0)
MCV: 93.9 fL (ref 80.0–100.0)
Platelets: 223 10*3/uL (ref 150–400)
RBC: 3.58 MIL/uL — ABNORMAL LOW (ref 4.22–5.81)
RDW: 22.1 % — ABNORMAL HIGH (ref 11.5–15.5)
WBC: 6.2 10*3/uL (ref 4.0–10.5)
nRBC: 0.5 % — ABNORMAL HIGH (ref 0.0–0.2)

## 2020-03-28 LAB — HEPATIC FUNCTION PANEL
ALT: 153 U/L — ABNORMAL HIGH (ref 0–44)
AST: 91 U/L — ABNORMAL HIGH (ref 15–41)
Albumin: 2.4 g/dL — ABNORMAL LOW (ref 3.5–5.0)
Alkaline Phosphatase: 166 U/L — ABNORMAL HIGH (ref 38–126)
Bilirubin, Direct: 0.2 mg/dL (ref 0.0–0.2)
Indirect Bilirubin: 0.8 mg/dL (ref 0.3–0.9)
Total Bilirubin: 1 mg/dL (ref 0.3–1.2)
Total Protein: 6.1 g/dL — ABNORMAL LOW (ref 6.5–8.1)

## 2020-03-28 LAB — TSH
TSH: 2.633 u[IU]/mL (ref 0.350–4.500)
TSH: 2.51 u[IU]/mL (ref 0.350–4.500)

## 2020-03-28 MED ORDER — AMIODARONE IV BOLUS ONLY 150 MG/100ML
150.0000 mg | Freq: Once | INTRAVENOUS | Status: AC
  Administered 2020-03-28: 150 mg via INTRAVENOUS
  Filled 2020-03-28: qty 100

## 2020-03-28 NOTE — Progress Notes (Signed)
Family Medicine Teaching Service Daily Progress Note Intern Pager: [phone]  Patient name: Jeremy Mccoy Medical record number: [id number] Date of birth: November 10, 1965 Age: 54 y.o. Gender: male  Primary Care Provider: Patient, No Pcp Per Consultants: Nephrology, cardiology  Code Status: Full   Pt Overview and Major Events to Date:  Admitted 11/10  Assessment and Plan: Jeremy Jeansonne McMahanis a 54 y.o.malepresenting with SOB and dizziness. PMH is significant forESRD on HD MWF, a-fibwith RVR,HTN,COPD, CHF, tobacco use,prior cocaine abuse,recent COVID,andmedication noncompliance.  A-Flutter with RVR vs. Sinus Tach vs. A-Fib with RVR EKG revealed atrial flutter. HR averages around 130s.   -cardiology following, appreciate involvement and recs -IV amiodarone infusion, per cardiology -eliquis 5 mg bid  -continue aspirin 81 mg daily  -f/u TSH and LFTs -tentative TEE 11/15 -monitor HR -follow up cardiology outpatient to determine long-term anticoagulation   ESRD  Chronic and stable. Cr 5.29. HD scheduled MWF. Last HD session 11/12.  -nephrology following, appreciate continued involvement and recs -HD per schedule and as needed, tentatively today   Hyponatremia Na 133 this morning. Likely due to fluid overload. HD tentatively today.  -monitor electrolytes daily  HTN BP 117/99 this morning. Home medications include amlodipine 10 mg daily. -Holding home amlodipine -IV amiodarone infusion, per cardiology -consider metoprolol on non-dialysis days only if needed for BP control  -monitor BP  COPD  Stable. Requiring O2 requirement of 2L.  -albuterol q6h prn  -wean O2 as tolerated  -Goal SpO2 >88%  Normocytic anemia Chronic and stable. Anemia of chronic disease likely secondary to ESRD status. Most recent Hgb 9.9 on 11/12.  -monitor Hgb, awaiting am CBC   HFmrEF Chronic and stable. Most recent echo 01/23/2020 notable for EF 50-55% with mild left ventricular hypertrophy with  presence of small pericardial effusion.  -daily weights -monitor I/Os -1500 fluid restriction diet   Hyperglycemia Most recent A1c 5.1. Blood glucose this morning 90. Hemoglobin A1c wnl at 5.1.  -monitor blood glucose   GERD Denies epigastric pain. -continue protonix 40 mg daily  -continue famotidine 20 mg bid prn   Tobacco use -nicotine patch -continue to encourage smoking cessation   FEN/GI: renal diet  PPx: heparin    Status is: Inpatient  Disposition: home when medically stable        Subjective:  No significant overnight events reported. Denies chest pain and worsening dyspnea.   Objective: Temp:  [97.2 F (36.2 C)-98.8 F (37.1 C)] 98.2 F (36.8 C) (11/12 2331) Pulse Rate:  [59-140] 133 (11/12 2331) Resp:  [16-35] 20 (11/12 2331) BP: (75-117)/(54-91) 117/81 (11/12 2331) SpO2:  [90 %-100 %] 94 % (11/12 2331) Weight:  [66.3 kg-67.5 kg] 66.6 kg (11/12 1125) Physical Exam: General: Patient sitting upright in bed, in no acute distress. Cardiovascular: tachycardic, no murmurs or gallops auscultated  Respiratory: faint crackles more prominently along upper lobes bilaterally, breathing without distress on 2L  Abdomen: soft, nontender, presence of active bowel sounds  Extremities: radial and distal pulses strong and equal bilaterally, no pitting LE edema noted bilaterally Psych: mood appropriate, pleasant   Laboratory: Recent Labs  Lab 03/25/20 0957 03/26/20 0820 03/27/20 0255  WBC 5.4 9.4 6.4  HGB 10.2* 10.2* 9.9*  HCT 35.2* 35.2* 33.5*  PLT 244 275 236   Recent Labs  Lab 03/25/20 0957 03/26/20 0820 03/27/20 0255  NA 136 125* 135  K 5.8* 7.3* 4.9  CL 91* 85* 94*  CO2 27 18* 25  BUN 80* 95* 51*  CREATININE 9.74* 10.58* 6.91*  CALCIUM  10.2 9.7 10.0  PROT 6.9  --   --   BILITOT 0.8  --   --   ALKPHOS 162*  --   --   ALT 26  --   --   AST 21  --   --   GLUCOSE 105* 291* 109*      Imaging/Diagnostic Tests: No results found.  Donney Dice, DO 03/28/2020, 2:46 AM PGY-1, [place] Intern pager: [phone], text pages welcome

## 2020-03-28 NOTE — Progress Notes (Signed)
[facility] Progress Note   Subjective:   Patient seen and examined at bedside.  Tired but no additional complaints.  Denies CP, palpitations, SOB, edema, abdominal pain and n/v/d. Scheduled to have extra HD today.  Objective Vitals:   03/27/20 2038 03/27/20 2331 03/28/20 0300 03/28/20 0758  BP: 101/78 117/81 (!) 117/99 (!) 123/94  Pulse: (!) 129 (!) 133 (!) 132 (!) 129  Resp: 20 20 20 20   Temp: 97.6 F (36.4 C) 98.2 F (36.8 C) 97.6 F (36.4 C) (!) 97.5 F (36.4 C)  TempSrc: Oral Oral Oral Oral  SpO2: 94% 94% 95% 98%  Weight:   70.6 kg   Height:       Physical Exam  General:chronicially ill appearing, WDWN male in NAD Heart:+tachycardia, no mrg Lungs:breath sounds decreased on L, faint crackles b/l bases Abdomen:soft, NTND Extremities:no LE edema Dialysis Access: LU AVF +b/t   Filed Weights   03/27/20 0740 03/27/20 1125 03/28/20 0300  Weight: 67.5 kg 66.6 kg 70.6 kg    Intake/Output Summary (Last 24 hours) at 03/28/2020 1057 Last data filed at 03/28/2020 0845 Gross per 24 hour  Intake 602.72 ml  Output 185 ml  Net 417.72 ml    Additional Objective Labs: Basic Metabolic Panel: Recent Labs  Lab 03/26/20 0820 03/26/20 0820 03/27/20 0255 03/28/20 0155 03/28/20 0601  NA 125*   < > 135 133*  133* 134*  K 7.3*   < > 4.9 4.1  4.1 4.2  CL 85*   < > 94* 94*  94* 91*  CO2 18*   < > 25 25  24 26   GLUCOSE 291*   < > 109* 89  90 99  BUN 95*   < > 51* 38*  38* 41*  CREATININE 10.58*   < > 6.91* 5.41*  5.29* 5.72*  CALCIUM 9.7   < > 10.0 9.8  9.8 10.1  PHOS 8.0*  --  6.1* 4.4  --    < > = values in this interval not displayed.   Liver Function Tests: Recent Labs  Lab 03/25/20 0957 03/26/20 0820 03/27/20 0255 03/28/20 0155 03/28/20 0601  AST 21  --   --  91* 87*  ALT 26  --   --  153* 154*  ALKPHOS 162*  --   --  166* 159*  BILITOT 0.8  --   --  1.0 0.7  PROT 6.9  --   --  6.1* 6.3*  ALBUMIN 2.8*   < > 2.5* 2.4*  2.5* 2.6*   < > =  values in this interval not displayed.   Recent Labs  Lab 03/25/20 0957  LIPASE 24   CBC: Recent Labs  Lab 03/25/20 0957 03/25/20 0957 03/26/20 0820 03/27/20 0255 03/28/20 0601  WBC 5.4   < > 9.4 6.4 6.2  NEUTROABS 4.0  --   --   --   --   HGB 10.2*   < > 10.2* 9.9* 10.0*  HCT 35.2*   < > 35.2* 33.5* 33.6*  MCV 96.2  --  95.4 93.6 93.9  PLT 244   < > 275 236 223   < > = values in this interval not displayed.   Blood Culture    Component Value Date/Time   SDES BLOOD SITE NOT SPECIFIED 03/25/2020 1008   SPECREQUEST  03/25/2020 1008    BOTTLES DRAWN AEROBIC AND ANAEROBIC Blood Culture results may not be optimal due to an inadequate volume of blood received in culture bottles  CULT  03/25/2020 1008    NO GROWTH 3 DAYS Performed at [facility], [place] [address], [place], [place] [address]    REPTSTATUS PENDING 03/25/2020 1008    Medications: . amiodarone 30 mg/hr (03/28/20 0427)   . apixaban  5 mg Oral BID  . aspirin  81 mg Oral Daily  . nicotine  14 mg Transdermal Daily  . pantoprazole  40 mg Oral Daily  . sevelamer carbonate  2,400 mg Oral TID with meals    Dialysis Orders: AF MWF 4h 37min 500/800 66.5kg 1K/2.5Ca  AVF No heparin Hectorol 3 TIW Parsabiv 15 Mircera 225 (last 11/8)  Assessment/Plan: 1. AFib w RVR on admission -- A flutter w/RVR on Tele. Lopressor and cardizem stopped due to hypotension. Amiodarone started on 11/12 with additional bolus given today due to ongoing tachycardia.  Eliquis started. Per cardiology. To have TEE/DCCV Monday.  2. ESRD - HD MWF. Missed HD Wed. Truncates all treatments as outpatient.HD off schedule Thurs.  Back on schedule yesterday. UF limited yesterday due to hypotension, extra HD ordered for today due to rales.   3. Hyperkalemia -chronic issue. On1K bath +weekly Veltassa as outpatient. Loklema ordered here.K 4.1 this AM. 4. Hypertension/volume - BP ok. Chronic volume overload. Likely pulm edema on CXR.UF 3.5L on  11/11, 0.1L on 11/12 limited due to hypotension. Extra HD planned for today with UF goal 3L as tolerated. Below EDW - will need adjustment outpt.  5. Anemia - Hgb 10.0. Recent ESA as outpatient. Follow.H/o GI bleeding -to see GI as outpatient 6. Metabolic bone disease - Ca elevated, corrected 11.2. phos in goal. Continue binders. Will hold hectorol for now due to increasing Ca. Use low Ca bath. Parsabiv not on hospital formulary 7. Nutrition - renal diet w/fluid restrictions   Jen Mow, PA-C [facility] 03/28/2020,10:57 AM  LOS: 2 days

## 2020-03-28 NOTE — Progress Notes (Addendum)
Progress Note  Patient Name: Jeremy Mccoy Date of Encounter: 03/28/2020  [facility] Cardiologist: Fransico Him, MD   Subjective   Denies any chest pain.  SOB when up ambulating in the room. TEle shows atrial flutter with RVR  Inpatient Medications    Scheduled Meds: . apixaban  5 mg Oral BID  . aspirin  81 mg Oral Daily  . nicotine  14 mg Transdermal Daily  . pantoprazole  40 mg Oral Daily  . sevelamer carbonate  2,400 mg Oral TID with meals   Continuous Infusions: . amiodarone 30 mg/hr (03/28/20 0427)   PRN Meds: acetaminophen, albuterol, famotidine, hydrOXYzine, sevelamer carbonate   Vital Signs    Vitals:   03/27/20 2038 03/27/20 2331 03/28/20 0300 03/28/20 0758  BP: 101/78 117/81 (!) 117/99 (!) 123/94  Pulse: (!) 129 (!) 133 (!) 132 (!) 129  Resp: 20 20 20 20   Temp: 97.6 F (36.4 C) 98.2 F (36.8 C) 97.6 F (36.4 C) (!) 97.5 F (36.4 C)  TempSrc: Oral Oral Oral Oral  SpO2: 94% 94% 95% 98%  Weight:   70.6 kg   Height:        Intake/Output Summary (Last 24 hours) at 03/28/2020 0806 Last data filed at 03/27/2020 1800 Gross per 24 hour  Intake 362.72 ml  Output 185 ml  Net 177.72 ml   Last 3 Weights 03/28/2020 03/27/2020 03/27/2020  Weight (lbs) 155 lb 10.3 oz 146 lb 13.2 oz 148 lb 13 oz  Weight (kg) 70.6 kg 66.6 kg 67.5 kg  Some encounter information is confidential and restricted. Go to Review Flowsheets activity to see all data.      Telemetry    Atrial flutter at 130s- Personally Reviewed  ECG    No new EKG to review  Physical Exam   GEN: Well nourished, well developed in no acute distress HEENT: Normal NECK: No JVD; No carotid bruits LYMPHATICS: No lymphadenopathy CARDIAC:regular and tachycardic, no murmurs, rubs, gallops RESPIRATORY:  Clear to auscultation without rales, wheezing or rhonchi  ABDOMEN: Soft, non-tender, non-distended MUSCULOSKELETAL:  No edema; No deformity  SKIN: Warm and dry NEUROLOGIC:  Alert and oriented  x 3 PSYCHIATRIC:  Normal affect    Labs    High Sensitivity Troponin:   Recent Labs  Lab 03/25/20 0957 03/25/20 1505  TROPONINIHS 64* 58*      Chemistry Recent Labs  Lab 03/25/20 0957 03/26/20 0820 03/27/20 0255 03/28/20 0155 03/28/20 0601  NA 136   < > 135 133*  133* 134*  K 5.8*   < > 4.9 4.1  4.1 4.2  CL 91*   < > 94* 94*  94* 91*  CO2 27   < > 25 25  24 26   GLUCOSE 105*   < > 109* 89  90 99  BUN 80*   < > 51* 38*  38* 41*  CREATININE 9.74*   < > 6.91* 5.41*  5.29* 5.72*  CALCIUM 10.2   < > 10.0 9.8  9.8 10.1  PROT 6.9  --   --  6.1* 6.3*  ALBUMIN 2.8*   < > 2.5* 2.4*  2.5* 2.6*  AST 21  --   --  91* 87*  ALT 26  --   --  153* 154*  ALKPHOS 162*  --   --  166* 159*  BILITOT 0.8  --   --  1.0 0.7  GFRNONAA 6*   < > 9* 12*  12* 11*  ANIONGAP 18*   < >  16* 14  15 17*   < > = values in this interval not displayed.     Hematology Recent Labs  Lab 03/26/20 0820 03/27/20 0255 03/28/20 0601  WBC 9.4 6.4 6.2  RBC 3.69* 3.58* 3.58*  HGB 10.2* 9.9* 10.0*  HCT 35.2* 33.5* 33.6*  MCV 95.4 93.6 93.9  MCH 27.6 27.7 27.9  MCHC 29.0* 29.6* 29.8*  RDW 22.5* 22.4* 22.1*  PLT 275 236 223     Radiology    No results found.  Cardiac Studies   Echocardiogram 01/23/20:  1. Left ventricular ejection fraction, by estimation, is 50 to 55%. The  left ventricle has low normal function. The left ventricle has no regional  wall motion abnormalities. There is mild left ventricular hypertrophy.  Left ventricular diastolic function  could not be evaluated.  2. Right ventricular systolic function is normal. The right ventricular  size is normal. There is moderately elevated pulmonary artery systolic  pressure.  3. Left atrial size was severely dilated.  4. Right atrial size was mildly dilated.  5. A small pericardial effusion is present.  6. The mitral valve is abnormal. Mild mitral valve regurgitation.  Moderate mitral stenosis. Severe mitral annular  calcification.  7. The aortic valve is tricuspid. There is mild calcification of the  aortic valve. There is mild thickening of the aortic valve. Aortic valve  regurgitation is trivial. Mild aortic valve sclerosis is present, with no  evidence of aortic valve stenosis.  8. The inferior vena cava is dilated in size with <50% respiratory  variability, suggesting right atrial pressure of 15 mmHg.   Comparison(s): Changes from prior study are noted.   Conclusion(s)/Recommendation(s): Severely calcified mitral valve with at  least moderate mitral stenosis. Highly variable heart rate between 90-120  bpm.   Patient Profile     54 y.o. male with a hx of COPD with ongoing tobacco use, hx of cocaine use, HTN, ESRD on HD (MWF) and recentCOVID/PNAinfection seen for afib/aflutter RVR.     Assessment & Plan    1. Atrial flutter with RVR - Initial rhythm concerning for atrial flutter vs sinus tachycardia. Not responded to IV Cardizem. Apparently, the patient's heart rate had reduced to 58 in sinus rhythm and his Cardizem drip was discontinued. However had recurrent elevated HR in 130s. Restarted IV cardizem. Felt to be atrial aflutter and started on Eliquis for anticoagulation. CHADSVASCs score is 1 for HTN - Cardizem stopped yesterday 2nd to soft blood pressure.  - Started Amiodarone with load yesterday - HR still in the 130's - will give an additional bolus of IV Amio 150mg  today - TEE/DCCV Monday   2. Moderate mitral stenosis with severe annular calcifications: -Per echocardiogram 01/23/20 showed EF at 50-55% with no RWMA, mod PAH, severely dilated LA and mildly dilated RA, small pericardial effusion, mild MR and mod MS with severe mitral annular calcifications. -No progressive disease  -continue to follow with echo annually  I have spent a total of 35 minutes with patient reviewing 2D echo , telemetry, EKGs, labs and examining patient as well as establishing an assessment and plan that was  discussed with the patient.  > 50% of time was spent in direct patient care.    For questions or updates, please contact [place] Please consult [email or web address] for contact info under       Signed, Fransico Him, MD  03/28/2020, 8:06 AM

## 2020-03-29 ENCOUNTER — Inpatient Hospital Stay (HOSPITAL_COMMUNITY): Payer: Medicare HMO

## 2020-03-29 DIAGNOSIS — I4892 Unspecified atrial flutter: Secondary | ICD-10-CM | POA: Diagnosis not present

## 2020-03-29 DIAGNOSIS — R0602 Shortness of breath: Secondary | ICD-10-CM | POA: Diagnosis not present

## 2020-03-29 DIAGNOSIS — I4891 Unspecified atrial fibrillation: Secondary | ICD-10-CM | POA: Diagnosis not present

## 2020-03-29 LAB — TYPE AND SCREEN
ABO/RH(D): O POS
Antibody Screen: NEGATIVE
Unit division: 0
Unit division: 0

## 2020-03-29 LAB — RENAL FUNCTION PANEL
Albumin: 2.5 g/dL — ABNORMAL LOW (ref 3.5–5.0)
Albumin: 2.6 g/dL — ABNORMAL LOW (ref 3.5–5.0)
Anion gap: 17 — ABNORMAL HIGH (ref 5–15)
Anion gap: 19 — ABNORMAL HIGH (ref 5–15)
BUN: 58 mg/dL — ABNORMAL HIGH (ref 6–20)
BUN: 72 mg/dL — ABNORMAL HIGH (ref 6–20)
CO2: 23 mmol/L (ref 22–32)
CO2: 21 mmol/L — ABNORMAL LOW (ref 22–32)
Calcium: 9.9 mg/dL (ref 8.9–10.3)
Calcium: 10 mg/dL (ref 8.9–10.3)
Chloride: 91 mmol/L — ABNORMAL LOW (ref 98–111)
Chloride: 89 mmol/L — ABNORMAL LOW (ref 98–111)
Creatinine, Ser: 7.07 mg/dL — ABNORMAL HIGH (ref 0.61–1.24)
Creatinine, Ser: 8.39 mg/dL — ABNORMAL HIGH (ref 0.61–1.24)
GFR, Estimated: 9 mL/min — ABNORMAL LOW (ref >60)
GFR, Estimated: 7 mL/min — ABNORMAL LOW (ref >60)
Glucose, Bld: 114 mg/dL — ABNORMAL HIGH (ref 70–99)
Glucose, Bld: 124 mg/dL — ABNORMAL HIGH (ref 70–99)
Phosphorus: 5.4 mg/dL — ABNORMAL HIGH (ref 2.5–4.6)
Phosphorus: 5.6 mg/dL — ABNORMAL HIGH (ref 2.5–4.6)
Potassium: 4.8 mmol/L (ref 3.5–5.1)
Potassium: 5.3 mmol/L — ABNORMAL HIGH (ref 3.5–5.1)
Sodium: 131 mmol/L — ABNORMAL LOW (ref 135–145)
Sodium: 129 mmol/L — ABNORMAL LOW (ref 135–145)

## 2020-03-29 LAB — CBC
HCT: 33.3 % — ABNORMAL LOW (ref 39.0–52.0)
HCT: 33.6 % — ABNORMAL LOW (ref 39.0–52.0)
Hemoglobin: 10.2 g/dL — ABNORMAL LOW (ref 13.0–17.0)
Hemoglobin: 10.1 g/dL — ABNORMAL LOW (ref 13.0–17.0)
MCH: 28.6 pg (ref 26.0–34.0)
MCH: 27.6 pg (ref 26.0–34.0)
MCHC: 30.6 g/dL (ref 30.0–36.0)
MCHC: 30.1 g/dL (ref 30.0–36.0)
MCV: 93.3 fL (ref 80.0–100.0)
MCV: 91.8 fL (ref 80.0–100.0)
Platelets: 233 10*3/uL (ref 150–400)
Platelets: 208 10*3/uL (ref 150–400)
RBC: 3.57 MIL/uL — ABNORMAL LOW (ref 4.22–5.81)
RBC: 3.66 MIL/uL — ABNORMAL LOW (ref 4.22–5.81)
RDW: 22.2 % — ABNORMAL HIGH (ref 11.5–15.5)
RDW: 22.5 % — ABNORMAL HIGH (ref 11.5–15.5)
WBC: 6.7 10*3/uL (ref 4.0–10.5)
WBC: 7.3 10*3/uL (ref 4.0–10.5)
nRBC: 0.3 % — ABNORMAL HIGH (ref 0.0–0.2)
nRBC: 0 % (ref 0.0–0.2)

## 2020-03-29 LAB — BPAM RBC
Blood Product Expiration Date: 202112182359
Blood Product Expiration Date: 202112182359
Unit Type and Rh: 5100
Unit Type and Rh: 5100

## 2020-03-29 MED ORDER — CHLORHEXIDINE GLUCONATE CLOTH 2 % EX PADS
6.0000 | MEDICATED_PAD | Freq: Every day | CUTANEOUS | Status: DC
  Administered 2020-03-30: 6 via TOPICAL

## 2020-03-29 MED ORDER — SODIUM CHLORIDE 0.9 % IV SOLN: 100.0000 mL | INTRAVENOUS | Status: DC | PRN

## 2020-03-29 MED ORDER — SODIUM CHLORIDE 0.9% FLUSH
3.0000 mL | Freq: Two times a day (BID) | INTRAVENOUS | Status: DC
  Administered 2020-03-29 – 2020-04-01 (×3): 3 mL via INTRAVENOUS

## 2020-03-29 MED ORDER — CHLORHEXIDINE GLUCONATE CLOTH 2 % EX PADS: 6.0000 | MEDICATED_PAD | Freq: Every day | CUTANEOUS | Status: DC

## 2020-03-29 MED ORDER — PENTAFLUOROPROP-TETRAFLUOROETH EX AERO: 1.0000 "application " | INHALATION_SPRAY | CUTANEOUS | Status: DC | PRN

## 2020-03-29 MED ORDER — HEPARIN SODIUM (PORCINE) 1000 UNIT/ML DIALYSIS
1000.0000 [IU] | INTRAMUSCULAR | Status: DC | PRN
  Filled 2020-03-29: qty 1

## 2020-03-29 MED ORDER — LIDOCAINE-PRILOCAINE 2.5-2.5 % EX CREA: 1.0000 "application " | TOPICAL_CREAM | CUTANEOUS | Status: DC | PRN

## 2020-03-29 MED ORDER — LIDOCAINE HCL (PF) 1 % IJ SOLN: 5.0000 mL | INTRAMUSCULAR | Status: DC | PRN

## 2020-03-29 MED ORDER — HEPARIN SODIUM (PORCINE) 1000 UNIT/ML DIALYSIS: 1000.0000 [IU] | INTRAMUSCULAR | Status: DC | PRN

## 2020-03-29 MED ORDER — ALTEPLASE 2 MG IJ SOLR: 2.0000 mg | Freq: Once | INTRAMUSCULAR | Status: DC | PRN

## 2020-03-29 MED ORDER — AMIODARONE IV BOLUS ONLY 150 MG/100ML
150.0000 mg | Freq: Once | INTRAVENOUS | Status: AC
  Administered 2020-03-29: 150 mg via INTRAVENOUS

## 2020-03-29 MED ORDER — SODIUM CHLORIDE 0.9 % IV SOLN: 250.0000 mL | INTRAVENOUS | Status: DC | PRN

## 2020-03-29 MED ORDER — SODIUM CHLORIDE 0.9% FLUSH: 3.0000 mL | INTRAVENOUS | Status: DC | PRN

## 2020-03-29 NOTE — Progress Notes (Addendum)
Family Medicine Teaching Service Daily Progress Note Intern Pager: [phone]  Patient name: Jeremy Mccoy Medical record number: [id number] Date of birth: [birth year] Age: 54 y.o. Gender: male  Primary Care Provider: Patient, No Pcp Per Consultants: Nephrology, cardiology  Code Status: FULL   Pt Overview and Major Events to Date:  Admitted 11/10  Assessment and Plan:  Jeremy Mccoy a 54 y.o.malepresenting with SOB and dizziness. PMH is significant forESRD on HD MWF, a-fibwith RVR,HTN,COPD, CHF, tobacco use,prior cocaine abuse,recent COVID,andmedication noncompliance.   A-Flutter with RVR, treating HR 130-140s overnight. Denies chest pain, dyspnea or palpitations. S/p metoprolol on admission, cardizem gtt  which was stopped due to soft Bps. CHADSVASCs score is 1 for HTN -Cardiology following, appreciate recs -Continue Amiodarone gtt, per cardiology -Continuous telemetry, monitor heart rate -Continue Eliquis 5 mg bid  -Continue Aspirin 81 mg daily  -TEE and cardioversion likely 11/15 -Follow up cardiology outpatient to determine long-term anticoagulation   ESRD, stable  Cr 7 today. HD scheduled MWF -Nephrology following, appreciate  recs -HD per nephrology -Sevelamer 2400mg  TID per nephro, 800-1600mg  PRN -Avoid nephrotoxic agents -Daily renal function panel   Hyponatremia, treating Na 131 today, likely 2/2 hypervolemia.  -Monitor with renal function panel  -Continue HD   HTN, treating BP 127/96 Home medications: amlodipine 10 mg daily. -Holding home amlodipine due to soft BPs -Consider metoprolol on non-dialysis days only if needed for BP control  -monitor BP  COPD  Sats 96% on 2 L -Continue albuterol q6h prn  -Wean O2 as tolerated  -Ambulate with pulse ox as able -Goal SpO2 >88%  Normocytic anemia, stable Hb 10.2. Likely anemia of chronic disease secondary to ESRD. -Monitor with CBC  HFmrEF, stable  Most recent echo 01/23/2020 notable  for EF 50-55% with mild left ventricular hypertrophy with presence of small pericardial effusion.  -Daily weights -Strict  I/Os -1500cc fluid restriction diet   Hyperglycemia Most recent A1c 5.1 CBG 266 on 11/11 -Monitor CBGs  GERD -Continue protonix 40 mg daily  -Continue famotidine 20 mg bid prn   Tobacco use -nicotine patch 14mg  daily   -Continue to encourage smoking cessation  FEN/GI: renal diet, Protonix, famotidine PPx: Heparin    Subjective:  No acute events overnight.  Patient reports dizziness and dyspnea on exertion if he ambulates, however he has been in his bed since he came to the hospital.   Objective: Temp:  [97.2 F (36.2 C)-98 F (36.7 C)] 98 F (36.7 C) (11/14 0411) Pulse Rate:  [120-138] 130 (11/14 0411) Resp:  [18-20] 19 (11/14 0411) BP: (101-127)/(76-99) 120/99 (11/14 0411) SpO2:  [85 %-100 %] 96 % (11/14 0411) Weight:  [68.3 kg-69.8 kg] 68.3 kg (11/14 0411)   Physical Exam: General: no acute distress, 54 year old Caucasian male sitting up in bed, tired appearing Cardiovascular: Afib, tachycardia Respiratory: ctab,normal wob Abdomen: soft, non tender, bowel sounds present  Extremities: no peripheral edema   Laboratory: Recent Labs  Lab 03/27/20 0255 03/28/20 0601 03/29/20 0228  WBC 6.4 6.2 6.7  HGB 9.9* 10.0* 10.2*  HCT 33.5* 33.6* 33.3*  PLT 236 223 233   Recent Labs  Lab 03/25/20 0957 03/26/20 0820 03/28/20 0155 03/28/20 0601 03/29/20 0228  NA 136   < > 133*   133* 134* 131*  K 5.8*   < > 4.1   4.1 4.2 4.8  CL 91*   < > 94*   94* 91* 91*  CO2 27   < > 25   24 26 23   BUN 80*   < >  38*   38* 41* 58*  CREATININE 9.74*   < > 5.41*   5.29* 5.72* 7.07*  CALCIUM 10.2   < > 9.8   9.8 10.1 9.9  PROT 6.9  --  6.1* 6.3*  --   BILITOT 0.8  --  1.0 0.7  --   ALKPHOS 162*  --  166* 159*  --   ALT 26  --  153* 154*  --   AST 21  --  91* 87*  --   GLUCOSE 105*   < > 89   90 99 114*   < > = values in this interval not displayed.      Imaging/Diagnostic Tests: No results found.  Lattie Haw, MD 03/29/2020, 5:55 AM PGY-2, [place] Intern pager: [phone], text pages welcome

## 2020-03-29 NOTE — Progress Notes (Signed)
Progress Note  Patient Name: Jeremy Mccoy Date of Encounter: 03/29/2020  [facility] Cardiologist: Fransico Him, MD   Subjective   Denies any chest pain.  Still with some DOE with ambulation.  Remains in atrial flutter with RVR  Inpatient Medications    Scheduled Meds:  apixaban  5 mg Oral BID   aspirin  81 mg Oral Daily   nicotine  14 mg Transdermal Daily   pantoprazole  40 mg Oral Daily   sevelamer carbonate  2,400 mg Oral TID with meals   Continuous Infusions:  amiodarone 30 mg/hr (03/28/20 1857)   PRN Meds: acetaminophen, albuterol, famotidine, hydrOXYzine, sevelamer carbonate   Vital Signs    Vitals:   03/28/20 1925 03/29/20 0010 03/29/20 0411 03/29/20 0734  BP: (!) 118/91 (!) 127/96 (!) 120/99 (!) 116/97  Pulse: (!) 131 (!) 135 (!) 130 (!) 130  Resp: 20 18 19 20   Temp: 97.6 F (36.4 C) 97.6 F (36.4 C) 98 F (36.7 C) 97.9 F (36.6 C)  TempSrc: Axillary Oral Oral Oral  SpO2:  97% 96% 96%  Weight:   68.3 kg   Height:        Intake/Output Summary (Last 24 hours) at 03/29/2020 0812 Last data filed at 03/28/2020 1530 Gross per 24 hour  Intake 240 ml  Output 3000 ml  Net -2760 ml   Last 3 Weights 03/29/2020 03/28/2020 03/28/2020  Weight (lbs) 150 lb 9.6 oz 153 lb 14.1 oz 155 lb 10.3 oz  Weight (kg) 68.312 kg 69.8 kg 70.6 kg  Some encounter information is confidential and restricted. Go to Review Flowsheets activity to see all data.      Telemetry    Atrial flutter at 130s- Personally Reviewed  ECG    No new EKG to review  Physical Exam   GEN: Well nourished, well developed in no acute distress HEENT: Normal NECK: No JVD; No carotid bruits LYMPHATICS: No lymphadenopathy CARDIAC:regular and tachycardic, no murmurs, rubs, gallops RESPIRATORY:  Clear to auscultation without rales, wheezing or rhonchi  ABDOMEN: Soft, non-tender, non-distended MUSCULOSKELETAL:  No edema; No deformity  SKIN: Warm and dry NEUROLOGIC:  Alert and  oriented x 3 PSYCHIATRIC:  Normal affect    Labs    High Sensitivity Troponin:   Recent Labs  Lab 03/25/20 0957 03/25/20 1505  TROPONINIHS 64* 58*      Chemistry Recent Labs  Lab 03/25/20 0957 03/26/20 0820 03/28/20 0155 03/28/20 0601 03/29/20 0228  NA 136   < > 133*   133* 134* 131*  K 5.8*   < > 4.1   4.1 4.2 4.8  CL 91*   < > 94*   94* 91* 91*  CO2 27   < > 25   24 26 23   GLUCOSE 105*   < > 89   90 99 114*  BUN 80*   < > 38*   38* 41* 58*  CREATININE 9.74*   < > 5.41*   5.29* 5.72* 7.07*  CALCIUM 10.2   < > 9.8   9.8 10.1 9.9  PROT 6.9  --  6.1* 6.3*  --   ALBUMIN 2.8*   < > 2.4*   2.5* 2.6* 2.5*  AST 21  --  91* 87*  --   ALT 26  --  153* 154*  --   ALKPHOS 162*  --  166* 159*  --   BILITOT 0.8  --  1.0 0.7  --   GFRNONAA 6*   < >  12*   12* 11* 9*  ANIONGAP 18*   < > 14   15 17* 17*   < > = values in this interval not displayed.     Hematology Recent Labs  Lab 03/27/20 0255 03/28/20 0601 03/29/20 0228  WBC 6.4 6.2 6.7  RBC 3.58* 3.58* 3.57*  HGB 9.9* 10.0* 10.2*  HCT 33.5* 33.6* 33.3*  MCV 93.6 93.9 93.3  MCH 27.7 27.9 28.6  MCHC 29.6* 29.8* 30.6  RDW 22.4* 22.1* 22.2*  PLT 236 223 233     Radiology    No results found.  Cardiac Studies   Echocardiogram 01/23/20:  1. Left ventricular ejection fraction, by estimation, is 50 to 55%. The  left ventricle has low normal function. The left ventricle has no regional  wall motion abnormalities. There is mild left ventricular hypertrophy.  Left ventricular diastolic function  could not be evaluated.  2. Right ventricular systolic function is normal. The right ventricular  size is normal. There is moderately elevated pulmonary artery systolic  pressure.  3. Left atrial size was severely dilated.  4. Right atrial size was mildly dilated.  5. A small pericardial effusion is present.  6. The mitral valve is abnormal. Mild mitral valve regurgitation.  Moderate mitral stenosis. Severe mitral  annular calcification.  7. The aortic valve is tricuspid. There is mild calcification of the  aortic valve. There is mild thickening of the aortic valve. Aortic valve  regurgitation is trivial. Mild aortic valve sclerosis is present, with no  evidence of aortic valve stenosis.  8. The inferior vena cava is dilated in size with <50% respiratory  variability, suggesting right atrial pressure of 15 mmHg.   Comparison(s): Changes from prior study are noted.   Conclusion(s)/Recommendation(s): Severely calcified mitral valve with at  least moderate mitral stenosis. Highly variable heart rate between 90-120  bpm.   Patient Profile     54 y.o. male with a hx of COPD with ongoing tobacco use, hx of cocaine use, HTN, ESRD on HD (MWF) and recentCOVID/PNAinfection seen for afib/aflutter RVR.     Assessment & Plan    1. Atrial flutter with RVR - Initial rhythm concerning for atrial flutter vs sinus tachycardia. Not responded to IV Cardizem. Apparently, the patient's heart rate had reduced to 58 in sinus rhythm and his Cardizem drip was discontinued. However had recurrent elevated HR in 130s. Restarted IV cardizem. Felt to be atrial aflutter and started on Eliquis for anticoagulation. CHADSVASCs score is 1 for HTN - Cardizem stopped  2nd to soft blood pressure.  - continue IV Amio gtt - HR still in the 130's - will give an additional bolus of IV Amio 150mg  today to try to slow down - plan TEE/DCCV Monday  -After careful review with patient of the risks and benefits of transesophageal echocardiogram have been explained including risks of esophageal damage, perforation (1:10,000 risk), bleeding, pharyngeal hematoma as well as other potential complications associated with conscious sedation including aspiration, arrhythmia, respiratory failure and death. Alternatives to treatment were discussed, questions were answered. Patient is willing to proceed.  -NPO after MN  2. Moderate mitral stenosis with  severe annular calcifications: -Per echocardiogram 01/23/20 showed EF at 50-55% with no RWMA, mod PAH, severely dilated LA and mildly dilated RA, small pericardial effusion, mild MR and mod MS with severe mitral annular calcifications. -No progressive disease  -continue to follow with echo annually  I have spent a total of 35 minutes with patient reviewing 2D echo , telemetry,  EKGs, labs and examining patient as well as establishing an assessment and plan that was discussed with the patient.  > 50% of time was spent in direct patient care.    For questions or updates, please contact [place] Please consult [email or web address] for contact info under       Signed, Fransico Him, MD  03/29/2020, 8:12 AM

## 2020-03-29 NOTE — Progress Notes (Signed)
[facility] Progress Note   Subjective:   Patient seen and examined at bedside.  HD yesterday tolerated well.  Continues to have dyspnea on exertion and dizziness.  Plan for TEE/DCCV tomorrow.   Objective Vitals:   03/29/20 0734 03/29/20 0938 03/29/20 1149 03/29/20 1247  BP: (!) 116/97  (!) 127/97   Pulse: (!) 130 (!) 131 (!) 117 (!) 128  Resp: 20     Temp: 97.9 F (36.6 C)     TempSrc: Oral     SpO2: 96% 93% (!) 89% 94%  Weight:      Height:       Physical Exam General:chronically ill appearing male in NAD Heart:+irregular rate and irregular rhythm Lungs:+crackles in b/l bases Abdomen:soft, NTND Extremities:no LE edema Dialysis Access: LU AVF +b/t   Filed Weights   03/28/20 0300 03/28/20 1248 03/29/20 0411  Weight: 70.6 kg 69.8 kg 68.3 kg    Intake/Output Summary (Last 24 hours) at 03/29/2020 1301 Last data filed at 03/28/2020 1530 Gross per 24 hour  Intake --  Output 3000 ml  Net -3000 ml    Additional Objective Labs: Basic Metabolic Panel: Recent Labs  Lab 03/27/20 0255 03/27/20 0255 03/28/20 0155 03/28/20 0601 03/29/20 0228  NA 135   < > 133*  133* 134* 131*  K 4.9   < > 4.1  4.1 4.2 4.8  CL 94*   < > 94*  94* 91* 91*  CO2 25   < > 25  24 26 23   GLUCOSE 109*   < > 89  90 99 114*  BUN 51*   < > 38*  38* 41* 58*  CREATININE 6.91*   < > 5.41*  5.29* 5.72* 7.07*  CALCIUM 10.0   < > 9.8  9.8 10.1 9.9  PHOS 6.1*  --  4.4  --  5.4*   < > = values in this interval not displayed.   Liver Function Tests: Recent Labs  Lab 03/25/20 0957 03/26/20 0820 03/28/20 0155 03/28/20 0601 03/29/20 0228  AST 21  --  91* 87*  --   ALT 26  --  153* 154*  --   ALKPHOS 162*  --  166* 159*  --   BILITOT 0.8  --  1.0 0.7  --   PROT 6.9  --  6.1* 6.3*  --   ALBUMIN 2.8*   < > 2.4*  2.5* 2.6* 2.5*   < > = values in this interval not displayed.   Recent Labs  Lab 03/25/20 0957  LIPASE 24   CBC: Recent Labs  Lab 03/25/20 0957  03/25/20 0957 03/26/20 0820 03/26/20 0820 03/27/20 0255 03/28/20 0601 03/29/20 0228  WBC 5.4   < > 9.4   < > 6.4 6.2 6.7  NEUTROABS 4.0  --   --   --   --   --   --   HGB 10.2*   < > 10.2*   < > 9.9* 10.0* 10.2*  HCT 35.2*   < > 35.2*   < > 33.5* 33.6* 33.3*  MCV 96.2  --  95.4  --  93.6 93.9 93.3  PLT 244   < > 275   < > 236 223 233   < > = values in this interval not displayed.    Medications: . sodium chloride    . amiodarone 30 mg/hr (03/29/20 0846)   . apixaban  5 mg Oral BID  . aspirin  81 mg Oral Daily  . nicotine  14 mg Transdermal Daily  . pantoprazole  40 mg Oral Daily  . sevelamer carbonate  2,400 mg Oral TID with meals  . sodium chloride flush  3 mL Intravenous Q12H    Dialysis Orders: AF MWF 4h 74min 500/800 66.5kg 1K/2.5Ca  AVF No heparin Hectorol 3 TIW Parsabiv 15 Mircera 225 (last 11/8)  Assessment/Plan: 1. AFib w RVR on admission --A flutter w/RVR ongoing. Lopressor and cardizem stopped due to hypotension.Amiodarone started on 11/12.  Eliquis started. Percardiology. To have TEE/DCCV Monday.  2. ESRD - HD MWF. Missed HD Wed. Truncates all treatments as outpatient.HD off scheduleThurs. Back on schedule yesterday.UF limited friday due to hypotension, extra HD ordered for Saturday due to rales. Plan for HD tomorrow per regular schedule - around cardiac procedure.  Possible extra HD tonight pending CXR. 3. Hyperkalemia -chronic issue. On1K bath +weekly Veltassa as outpatient. Loklema ordered here.K 4.8 this AM. 4. Hypertension/volume - BP ok. Chronic volume overload. Likely pulm edema on CXR.UF 3.5L on 11/11, 0.1L on 11/12 (limited due to hypotension) and 3L yesterday. Will get CXR to assess for pulmonary edema, if looks like fluid will run extra HD tonight for fluid removal, if not plan for HD tomorrow as scheduled.  5. Anemia - Hgb 10.2. Recent ESA as outpatient. Follow.H/o GI bleeding -to see GI as outpatient 6. Metabolic bone disease - Ca  elevated, corrected 11.2. phos in goal. Continue binders. Will hold hectorol for now due to increasing Ca. Use low Ca bath. Parsabiv not on hospital formulary 7. Nutrition - renal diet w/fluid restrictions  Jen Mow, PA-C [facility] 03/29/2020,1:01 PM  LOS: 3 days

## 2020-03-29 NOTE — Evaluation (Signed)
Physical Therapy Evaluation Patient Details Name: Jeremy Mccoy MRN: [id number] DOB: 1965/08/08 Today's Date: 03/29/2020   History of Present Illness  pt is is a 54 y.o. male presenting with SOB and dizziness. PMH is significant for ESRD on HD MWF, a-fib with RVR, HTN, COPD, CHF, tobacco use, prior cocaine abuse, recent COVID, and medication noncompliance  Clinical Impression  Pt admitted with SOB and dizziness; Pt was I prior to admission and is currently performing bed mobility and transfers mod I-I level; pt requiring S for short ambulation distance; RN limited evaluation due to HR and possible procedure tomorrow; Pt is limited in activity tolerance and becomes SOB prior to admission with increased activity; pt will benefit from skilled PT to improve activity tolerance and assess gait and stair negotiation prior to discharge.     Follow Up Recommendations No PT follow up    Equipment Recommendations  None recommended by PT    Recommendations for Other Services       Precautions / Restrictions Precautions Precautions: Fall;Other (comment) Precaution Comments: monitor HR; pt has been in 130's during admission, RN ok with evaluation wtih limited activity, transfers only for evaluation Restrictions Weight Bearing Restrictions: No      Mobility  Bed Mobility Overal bed mobility: Independent                  Transfers Overall transfer level: Modified independent Equipment used: None                Ambulation/Gait Ambulation/Gait assistance: Supervision Gait Distance (Feet): 6 Feet Assistive device: None       General Gait Details: pt limited during evaluation to short ambulatino to chair and back to bed; S needed  Stairs            Wheelchair Mobility    Modified Rankin (Stroke Patients Only)       Balance Overall balance assessment: Mild deficits observed, not formally tested                                            Pertinent Vitals/Pain Pain Assessment: No/denies pain    Home Living Family/patient expects to be discharged to:: Private residence Living Arrangements: Non-relatives/Friends Available Help at Discharge: Available 24 hours/day Type of Home: House Home Access: Level entry     Home Layout: Laundry or work area in basement (full flight of stairs with B handrails) Home Equipment: None      Prior Function Level of Independence: Independent         Comments: Pt continues to receive HD     Hand Dominance   Dominant Hand: Right    Extremity/Trunk Assessment        Lower Extremity Assessment Lower Extremity Assessment: Overall WFL for tasks assessed       Communication   Communication: No difficulties  Cognition Arousal/Alertness: Awake/alert Behavior During Therapy: WFL for tasks assessed/performed Overall Cognitive Status: Within Functional Limits for tasks assessed                                        General Comments General comments (skin integrity, edema, etc.): RN requesting limited evaluation due HR and possible procedure tomorrow; pt mod I- I with bed mobility and transfers; will need to assess ambulation longer distances  and stairs; HR remained 127-131 (RN present in room during evaluation)    Exercises     Assessment/Plan    PT Assessment Patient needs continued PT services  PT Problem List Decreased activity tolerance       PT Treatment Interventions Gait training;Stair training;Therapeutic exercise    PT Goals (Current goals can be found in the Care Plan section)  Acute Rehab PT Goals Patient Stated Goal: I want to get up more PT Goal Formulation: With patient Time For Goal Achievement: 04/12/20 Potential to Achieve Goals: Good    Frequency Min 3X/week   Barriers to discharge        Co-evaluation               AM-PAC PT "6 Clicks" Mobility  Outcome Measure Help needed turning from your back to your side  while in a flat bed without using bedrails?: None Help needed moving from lying on your back to sitting on the side of a flat bed without using bedrails?: None Help needed moving to and from a bed to a chair (including a wheelchair)?: None Help needed standing up from a chair using your arms (e.g., wheelchair or bedside chair)?: None Help needed to walk in hospital room?: A Little Help needed climbing 3-5 steps with a railing? : A Little 6 Click Score: 22    End of Session Equipment Utilized During Treatment: Gait belt Activity Tolerance: Patient tolerated treatment well Patient left: in bed;with nursing/sitter in room;with call bell/phone within reach Nurse Communication: Mobility status PT Visit Diagnosis: Other abnormalities of gait and mobility (R26.89)    Time: 1308-6578 PT Time Calculation (min) (ACUTE ONLY): 12 min   Charges:   PT Evaluation $PT Eval Low Complexity: 1 Low          Lyanne Co, DPT Acute Rehabilitation Services [phone]  [place] 03/29/2020, 9:47 AM

## 2020-03-30 ENCOUNTER — Inpatient Hospital Stay (HOSPITAL_COMMUNITY): Payer: Medicare HMO | Admitting: Anesthesiology

## 2020-03-30 ENCOUNTER — Encounter (HOSPITAL_COMMUNITY): Payer: Self-pay | Admitting: Family Medicine

## 2020-03-30 ENCOUNTER — Telehealth: Payer: Self-pay | Admitting: *Deleted

## 2020-03-30 ENCOUNTER — Encounter (HOSPITAL_COMMUNITY)
Admission: EM | Disposition: A | Discharge status: Home / Self Care | Payer: Self-pay | Source: Home / Self Care | Attending: Family Medicine

## 2020-03-30 ENCOUNTER — Inpatient Hospital Stay (HOSPITAL_COMMUNITY): Payer: Medicare HMO

## 2020-03-30 DIAGNOSIS — I484 Atypical atrial flutter: Secondary | ICD-10-CM

## 2020-03-30 DIAGNOSIS — E875 Hyperkalemia: Secondary | ICD-10-CM | POA: Diagnosis not present

## 2020-03-30 DIAGNOSIS — I5033 Acute on chronic diastolic (congestive) heart failure: Secondary | ICD-10-CM | POA: Diagnosis not present

## 2020-03-30 DIAGNOSIS — R0602 Shortness of breath: Secondary | ICD-10-CM | POA: Diagnosis not present

## 2020-03-30 LAB — POCT I-STAT, CHEM 8
BUN: 91 mg/dL — ABNORMAL HIGH (ref 6–20)
Calcium, Ion: 1.16 mmol/L (ref 1.15–1.40)
Chloride: 94 mmol/L — ABNORMAL LOW (ref 98–111)
Creatinine, Ser: 10.4 mg/dL — ABNORMAL HIGH (ref 0.61–1.24)
Glucose, Bld: 90 mg/dL (ref 70–99)
HCT: 34 % — ABNORMAL LOW (ref 39.0–52.0)
Hemoglobin: 11.6 g/dL — ABNORMAL LOW (ref 13.0–17.0)
Potassium: 6.2 mmol/L — ABNORMAL HIGH (ref 3.5–5.1)
Sodium: 125 mmol/L — ABNORMAL LOW (ref 135–145)
TCO2: 23 mmol/L (ref 22–32)

## 2020-03-30 LAB — RENAL FUNCTION PANEL
Albumin: 2.7 g/dL — ABNORMAL LOW (ref 3.5–5.0)
Anion gap: 17 — ABNORMAL HIGH (ref 5–15)
BUN: 83 mg/dL — ABNORMAL HIGH (ref 6–20)
CO2: 21 mmol/L — ABNORMAL LOW (ref 22–32)
Calcium: 10.1 mg/dL (ref 8.9–10.3)
Chloride: 90 mmol/L — ABNORMAL LOW (ref 98–111)
Creatinine, Ser: 9.34 mg/dL — ABNORMAL HIGH (ref 0.61–1.24)
GFR, Estimated: 6 mL/min — ABNORMAL LOW (ref >60)
Glucose, Bld: 105 mg/dL — ABNORMAL HIGH (ref 70–99)
Phosphorus: 6 mg/dL — ABNORMAL HIGH (ref 2.5–4.6)
Potassium: 5.7 mmol/L — ABNORMAL HIGH (ref 3.5–5.1)
Sodium: 128 mmol/L — ABNORMAL LOW (ref 135–145)

## 2020-03-30 LAB — CULTURE, BLOOD (ROUTINE X 2)
Culture: NO GROWTH
Culture: NO GROWTH

## 2020-03-30 SURGERY — CANCELLED PROCEDURE

## 2020-03-30 MED ORDER — SODIUM ZIRCONIUM CYCLOSILICATE 10 G PO PACK
10.0000 g | PACK | Freq: Once | ORAL | Status: AC
  Administered 2020-03-30: 10 g via ORAL
  Filled 2020-03-30: qty 1

## 2020-03-30 NOTE — Care Management Important Message (Signed)
Important Message  Patient Details  Name: JAVI BOLLMAN MRN: [id number] Date of Birth: 17-Apr-1966   Medicare Important Message Given:  Yes     Shelda Altes 03/30/2020, 11:14 AM

## 2020-03-30 NOTE — Progress Notes (Addendum)
Family Medicine Teaching Service Daily Progress Note Intern Pager: [phone]  Patient name: Jeremy Mccoy Medical record number: [id number] Date of birth: Sep 26, 1965 Age: 54 y.o. Gender: male  Primary Care Provider: Patient, No Pcp Per Consultants: Cardiology, nephrology Code Status: Full  Pt Overview and Major Events to Date:  Admitted 11/10  Assessment and Plan: Jeremy Mccoy a 54 y.o.malepresenting with SOB and dizziness. PMH is significant forESRD on HD MWF, a-fibwith RVR,HTN,COPD, CHF, tobacco use,prior cocaine abuse,recent COVID,andmedication noncompliance.   A-Flutter with RVR HR 120-130s overnight. Denies chest pain, dyspnea or palpitations. S/p metoprolol on admission, cardizem gtt  which was stopped due to soft Bps. CHADSVASCs score is 1 for HTN. -Cardiology following, appreciate continued involvement and recs -Continue Amiodarone gtt, per cardiology -Continuous telemetry, monitor heart rate -Continue Eliquis 5 mg bid  -Continue Aspirin 81 mg daily -TEE and cardioversion today, f/u  -Follow up cardiology outpatient to determine long-term anticoagulation  ESRD Chronic and stable. Cr9.34 today.HD scheduled MWF -Nephrology following, appreciate  recs -HD per nephrology -Sevelamer 2400mg  TID per nephro, 800-1600mg  PRN -Avoid nephrotoxic agents -Daily renal function panel   Electrolyte abnormalities  Hyponatremia of Na 128 this morning, likely secondary to fluid overload. Hyperkalemia with K 5.7. -Monitor with renal function panel  -Tentative HD today  HTN Home meds include amlodipine 10 mg daily. Hypertensive this morning at 140/110>131/106.  -Holding home amlodipine due to soft BPs -Consider metoprolol on non-dialysis days only if needed for BP control -monitor BP  COPD  Stable, currently on 3 L and with 97% O2 sats -Continue albuterol q6h prn -Wean O2 as tolerated -Goal SpO2 >88%  Normocytic anemia Chronic and stable. Most  recent Hb 10.1. Likely anemia of chronic disease secondary to ESRD. -Monitor with CBC  HFmrEF  Mitral valve regurgitation  Chronic and stable. Most recent echo 01/23/2020 notable for EF 50-55% with mild left ventricular hypertrophy with presence of small pericardial effusion.  -cardiology following -f/u TEE today  -Daily weights -Strict  I/Os -1500cc fluid restriction diet  Hyperglycemia Most recent A1c 5.1 and blood glucose 124.  -Monitor CBGs  GERD -Continue protonix 40 mg daily  -Continue famotidine 20 mg bid prn   Tobacco use -nicotine patch 14mg  daily   -Continue to encourage smoking cessation   FEN/GI: NPO, pending TEE/cardioversion PPx: heparin    Status is: Inpatient  Disposition: inpatient, pending cardiology and nephrology recs         Subjective:  No significant overnight events reported. Patient denies worsening dyspnea and chest pain.   Objective: Temp:  [97.6 F (36.4 C)-98.2 F (36.8 C)] 97.6 F (36.4 C) (11/15 0600) Pulse Rate:  [117-131] 123 (11/15 0600) Resp:  [18-30] 20 (11/15 0600) BP: (102-140)/(76-114) 140/110 (11/15 0620) SpO2:  [89 %-100 %] 96 % (11/14 2239) Weight:  [69.9 kg] 69.9 kg (11/15 0600) Physical Exam: General: Patient laying down comfortably, in no acute distress. Cardiovascular: Tachycardic, no murmurs or gallops auscultated  Respiratory: faint crackles diffusely, good air movement noted, breathing without respiratory distress on 3 L Abdomen: soft, nontender, presence of active bowel sounds  Extremities: radial and distal pulses strong and equal bilaterally, no LE edema noted Derm: skin warm and dry to touch   Laboratory: Recent Labs  Lab 03/28/20 0601 03/29/20 0228 03/29/20 1806  WBC 6.2 6.7 7.3  HGB 10.0* 10.2* 10.1*  HCT 33.6* 33.3* 33.6*  PLT 223 233 208   Recent Labs  Lab 03/25/20 0957 03/26/20 0820 03/28/20 0155 03/28/20 0155 03/28/20 0601 03/28/20 0601 03/29/20  [id number] 03/29/20 1806  03/30/20 0415  NA 136   < > 133*  133*   < > 134*   < > 131* 129* 128*  K 5.8*   < > 4.1  4.1   < > 4.2   < > 4.8 5.3* 5.7*  CL 91*   < > 94*  94*   < > 91*   < > 91* 89* 90*  CO2 27   < > 25  24   < > 26   < > 23 21* 21*  BUN 80*   < > 38*  38*   < > 41*   < > 58* 72* 83*  CREATININE 9.74*   < > 5.41*  5.29*   < > 5.72*   < > 7.07* 8.39* 9.34*  CALCIUM 10.2   < > 9.8  9.8   < > 10.1   < > 9.9 10.0 10.1  PROT 6.9  --  6.1*  --  6.3*  --   --   --   --   BILITOT 0.8  --  1.0  --  0.7  --   --   --   --   ALKPHOS 162*  --  166*  --  159*  --   --   --   --   ALT 26  --  153*  --  154*  --   --   --   --   AST 21  --  91*  --  87*  --   --   --   --   GLUCOSE 105*   < > 89  90   < > 99   < > 114* 124* 105*   < > = values in this interval not displayed.      Imaging/Diagnostic Tests: DG Chest 2 View  Result Date: 03/29/2020 CLINICAL DATA:  Shortness of breath and chest pain. EXAM: CHEST - 2 VIEW COMPARISON:  January 20, 2020 FINDINGS: Enlarged cardiac silhouette. Persistent streaky airspace consolidation with associated architectural distortion in the right upper lobe. Persistent to slightly worsened airspace consolidation in the right lower lobe. More prominent diffuse bilateral haziness of the lungs. Enlarged right pleural effusion. Osseous structures are without acute abnormality. Soft tissues are grossly normal. IMPRESSION: 1. Persistent streaky airspace consolidation with associated architectural distortion in the right upper lobe. 2. Persistent to slightly worsened airspace consolidation in the right lower lobe. 3. More prominent diffuse bilateral haziness of the lungs may represent interstitial pulmonary edema or other widespread infectious or inflammatory interstitial disease. 4. Increasing in size right pleural effusion. 5. Enlarged cardiac silhouette. Electronically Signed   By: Fidela Salisbury M.D.   On: 03/29/2020 15:53     Donney Dice, DO 03/30/2020, 6:53 AM PGY-1,  [place] Intern pager: [phone], text pages welcome

## 2020-03-30 NOTE — Telephone Encounter (Signed)
Dr Rush Landmark,  You saw this pt back in September when he was admitted and was to be scheduled for a [facility] colon/ EGD in 4-6 weeks- he is scheduled for 04-28-2020  He is currently admitted since 03-25-2020 for Afib with RVR ( note from adm below)-   )=Presents with dyspnea, found to be in afib with RVR, presently maxed on dilt drip.   Recent admission at [facility] also for afib with RVR and it appears patient was to undergo cardioversion but left against medical advice prior to this being done.  Cardiology has seen here and are reloading with dilt. Trops flat, doubt ACS.   Nephro on board to manage HD, plan is for tomorrow, has received lokelma to manage hyperkalemia.  Agree with admission, cardiology & nephrology consultations. Appreciate cardiology & nephrology recommendations.   Leeanne Rio, MD 03/25/2020   He is now on Eliquis, he has a TEE scheduled for tomorrow- his PV is 11-30 and ECL 12-14  Does he now need an OV prior to a colon?  Please advise- Thanks, Lelan Pons

## 2020-03-30 NOTE — Progress Notes (Signed)
[facility] Progress Note   Subjective:    Had extra HD yesterday net UF 3L  Seen in room, comfortable at rest but has dizziness + dyspnea on exertion.  For cardioversion/TEE today.   Objective Vitals:   03/29/20 2239 03/30/20 0600 03/30/20 0620 03/30/20 0750  BP: (!) 105/94  (!) 140/110 (!) 131/106  Pulse: (!) 123 (!) 123  (!) 122  Resp: (!) 23 20  20   Temp: 97.8 F (36.6 C) 97.6 F (36.4 C)  97.6 F (36.4 C)  TempSrc: Oral Axillary  Oral  SpO2: 96%   97%  Weight:  69.9 kg    Height:       Physical Exam General:chronically ill appearing male in NAD Heart:+irregular rate and irregular rhythm Lungs:+crackles in b/l bases Abdomen:soft, NTND Extremities:no LE edema Dialysis Access: LU AVF +b/t   Filed Weights   03/28/20 1248 03/29/20 0411 03/30/20 0600  Weight: 69.8 kg 68.3 kg 69.9 kg    Intake/Output Summary (Last 24 hours) at 03/30/2020 1012 Last data filed at 03/30/2020 0324 Gross per 24 hour  Intake 1120.45 ml  Output 3015 ml  Net -1894.55 ml    Additional Objective Labs: Basic Metabolic Panel: Recent Labs  Lab 03/29/20 0228 03/29/20 1806 03/30/20 0415  NA 131* 129* 128*  K 4.8 5.3* 5.7*  CL 91* 89* 90*  CO2 23 21* 21*  GLUCOSE 114* 124* 105*  BUN 58* 72* 83*  CREATININE 7.07* 8.39* 9.34*  CALCIUM 9.9 10.0 10.1  PHOS 5.4* 5.6* 6.0*   Liver Function Tests: Recent Labs  Lab 03/25/20 0957 03/26/20 0820 03/28/20 0155 03/28/20 0155 03/28/20 0601 03/28/20 0601 03/29/20 0228 03/29/20 1806 03/30/20 0415  AST 21  --  91*  --  87*  --   --   --   --   ALT 26  --  153*  --  154*  --   --   --   --   ALKPHOS 162*  --  166*  --  159*  --   --   --   --   BILITOT 0.8  --  1.0  --  0.7  --   --   --   --   PROT 6.9  --  6.1*  --  6.3*  --   --   --   --   ALBUMIN 2.8*   < > 2.4*  2.5*   < > 2.6*   < > 2.5* 2.6* 2.7*   < > = values in this interval not displayed.   Recent Labs  Lab 03/25/20 0957  LIPASE 24   CBC: Recent Labs   Lab 03/25/20 0957 03/25/20 0957 03/26/20 0820 03/26/20 0820 03/27/20 0255 03/27/20 0255 03/28/20 0601 03/29/20 0228 03/29/20 1806  WBC 5.4   < > 9.4   < > 6.4   < > 6.2 6.7 7.3  NEUTROABS 4.0  --   --   --   --   --   --   --   --   HGB 10.2*   < > 10.2*   < > 9.9*   < > 10.0* 10.2* 10.1*  HCT 35.2*   < > 35.2*   < > 33.5*   < > 33.6* 33.3* 33.6*  MCV 96.2   < > 95.4  --  93.6  --  93.9 93.3 91.8  PLT 244   < > 275   < > 236   < > 223 233 208   < > =  values in this interval not displayed.    Medications: . sodium chloride    . amiodarone 30 mg/hr (03/30/20 0324)   . apixaban  5 mg Oral BID  . Chlorhexidine Gluconate Cloth  6 each Topical Q0600  . Chlorhexidine Gluconate Cloth  6 each Topical Q0600  . nicotine  14 mg Transdermal Daily  . pantoprazole  40 mg Oral Daily  . sevelamer carbonate  2,400 mg Oral TID with meals  . sodium chloride flush  3 mL Intravenous Q12H    Dialysis Orders: AF MWF 4h 36min 500/800 66.5kg 1K/2.5Ca  AVF No heparin Hectorol 3 TIW Parsabiv 15 Mircera 225 (last 11/8)  Assessment/Plan: 1. AFib w RVR on admission --A flutter w/RVR ongoing. Lopressor and cardizem stopped due to hypotension.Amiodarone started on 11/12.  Eliquis started. Percardiology. To have TEE/DCCV Monday.  2. ESRD - HD MWF. Truncates all treatments as outpatient.HD off scheduleThurs.UF limited friday due to hypotension. Had  extra HD on 11/13 and 11/14  due to volume. Plan for HD today schedule - around cardiac procedure.  3. Hyperkalemia -chronic issue. On1K bath +weekly Veltassa as outpatient. Loklema ordered here.K 4.8 this AM. 4. Hypertension/volume - BP ok. Chronic volume overload. Likely pulm edema on CXR. Had extra HD x2 over weekend with 6L removed. Has orders for HD today 5. Anemia - Hgb 10.2. Recent ESA as outpatient. Follow.H/o GI bleeding -to see GI as outpatient 6. Metabolic bone disease - Ca elevated, corrected 11.2. phos in goal. Continue binders.  Will hold hectorol for now due to increasing Ca. Use low Ca bath. Parsabiv not on hospital formulary 7. Nutrition - renal diet w/fluid restrictions  Lynnda Child PA-C [facility] 03/30/2020,10:23 AM

## 2020-03-30 NOTE — Anesthesia Preprocedure Evaluation (Signed)
Anesthesia Evaluation  Patient identified by MRN, date of birth, ID band Patient awake    Reviewed: Allergy & Precautions, NPO status , Patient's Chart, lab work & pertinent test results  Airway Mallampati: I  TM Distance: >3 FB Neck ROM: Full    Dental no notable dental hx. (+) Teeth Intact, Dental Advisory Given   Pulmonary shortness of breath and with exertion, COPD,  COPD inhaler, Current Smoker and Patient abstained from smoking.,  50 pack year history, still smoking 1ppd COVID 3 weeks ago   Pulmonary exam normal breath sounds clear to auscultation       Cardiovascular hypertension, Pt. on medications +CHF (diastolic dysfunction, LVEF 50%)  + dysrhythmias Atrial Fibrillation + Valvular Problems/Murmurs (mild MR, mod MS) MR  Rhythm:Irregular Rate:Tachycardia  Echo 01/23/20: 1. Left ventricular ejection fraction, by estimation, is 50 to 55%. The  left ventricle has low normal function. The left ventricle has no regional  wall motion abnormalities. There is mild left ventricular hypertrophy.  Left ventricular diastolic function  could not be evaluated.  2. Right ventricular systolic function is normal. The right ventricular  size is normal. There is moderately elevated pulmonary artery systolic  pressure.  3. Left atrial size was severely dilated.  4. Right atrial size was mildly dilated.  5. A small pericardial effusion is present.  6. The mitral valve is abnormal. Mild mitral valve regurgitation.  Moderate mitral stenosis. Severe mitral annular calcification.  7. The aortic valve is tricuspid. There is mild calcification of the  aortic valve. There is mild thickening of the aortic valve. Aortic valve  regurgitation is trivial. Mild aortic valve sclerosis is present, with no  evidence of aortic valve stenosis.  8. The inferior vena cava is dilated in size with <50% respiratory  variability, suggesting right atrial  pressure of 15 mmHg.    Neuro/Psych  Headaches, Seizures -,  PSYCHIATRIC DISORDERS Anxiety Depression    GI/Hepatic GERD  Medicated and Controlled,(+)     substance abuse  cocaine use,   Endo/Other  negative endocrine ROS  Renal/GU ESRF and DialysisRenal diseaseLast HD last night, K 5.7 this AM  negative genitourinary   Musculoskeletal  (+) Arthritis , Osteoarthritis,    Abdominal   Peds  Hematology  (+) Blood dyscrasia, anemia , H/H 10.1/33.6   Anesthesia Other Findings started on Eliquis. Initially treated with IV diltiazem, this was transitioned to amiodarone due to soft BP. currently in 2:1 atrial flutter, rate in 120s  Reproductive/Obstetrics negative OB ROS                             Anesthesia Physical Anesthesia Plan  ASA: IV  Anesthesia Plan: General   Post-op Pain Management:    Induction: Intravenous  PONV Risk Score and Plan: 1 and Propofol infusion, TIVA and Treatment may vary due to age or medical condition  Airway Management Planned: Natural Airway, Nasal Cannula and Mask  Additional Equipment: None  Intra-op Plan:   Post-operative Plan:   Informed Consent: I have reviewed the patients History and Physical, chart, labs and discussed the procedure including the risks, benefits and alternatives for the proposed anesthesia with the patient or authorized representative who has indicated his/her understanding and acceptance.     Dental advisory given  Plan Discussed with: CRNA  Anesthesia Plan Comments:         Anesthesia Quick Evaluation

## 2020-03-30 NOTE — Progress Notes (Signed)
Procedure cancelled due to K+ being 6.2.

## 2020-03-30 NOTE — Progress Notes (Addendum)
Scheduled to have TEE DCCV tomorrow. Today's procedure cancelled due to elevated K.   Late addition: Discussed with patient reminding him to avoid eating tomorrow.  We will draw potassium level stat first thing tomorrow morning in anticipation of cardioversion, around 2 PM.

## 2020-03-30 NOTE — Hospital Course (Addendum)
Jeremy Mccoy is a 54 y.o. male who presented with SOB and dizziness. PMH is significant for ESRD on HD MWF, a-fib with RVR, HTN, COPD, CHF, tobacco use, prior cocaine abuse, recent COVID, and medication noncompliance.  A-Flutter with RVR Patient presented in 2:1 a-flutter with RVR and was placed on a diltiazem drip. His HR remained in the 130s despite Diltiazem drip and metoprolol. Cardiology followed the patient throughout his admission. Patient was started on Eliquis 5mg  BID for anticoagulation. He was switched to an amiodarone drip on 11/12, but was still persistently in a-flutter with HR 130s.  Patient was scheduled for multiple TEE DCCV but this had to be rescheduled due to hyperkalemia.  Ultimately, on 11/17 patient underwent successful TEE DCCV 11/16 and maintained NSR through discharge. Cardiology recommends continuing anticoagulation with Eliquis after discharge.  Patient was also transitioned from diltiazem to amiodarone and discharged on amiodarone 200 mg twice daily for 1 week and then transition to 200 mg daily.   ESRD on HD M/W/F  Hyperkalemia Patient was followed by nephrology throughout his admission. He received HD daily from 11/11-11/17.  He reportedly truncates all HD sessions as an outpatient.   All other issues chronic and stable.

## 2020-03-30 NOTE — H&P (View-Only) (Signed)
The patient has been seen in conjunction with [name], PAC. All aspects of care have been considered and discussed. The patient has been personally interviewed, examined, and all clinical data has been reviewed.   Agree with his note and plans as outlined.  Plan for TEE cardioversion 11 AM today.  Persistent acute on chronic diastolic heart failure related to atrial flutter, will hopefully improve with sinus rhythm.  Patient is very impatient.  I encouraged him to see the course and allow [place] to optimize his cardiovascular state.   Progress Note  Patient Name: Jeremy Mccoy Date of Encounter: 03/30/2020  [facility] Cardiologist: Fransico Him, MD   Subjective   Still SOB and occasional chest tightness with exertion.   Inpatient Medications    Scheduled Meds: . apixaban  5 mg Oral BID  . aspirin  81 mg Oral Daily  . Chlorhexidine Gluconate Cloth  6 each Topical Q0600  . Chlorhexidine Gluconate Cloth  6 each Topical Q0600  . nicotine  14 mg Transdermal Daily  . pantoprazole  40 mg Oral Daily  . sevelamer carbonate  2,400 mg Oral TID with meals  . sodium chloride flush  3 mL Intravenous Q12H   Continuous Infusions: . sodium chloride    . amiodarone 30 mg/hr (03/30/20 0324)   PRN Meds: sodium chloride, acetaminophen, albuterol, famotidine, hydrOXYzine, sevelamer carbonate, sodium chloride flush   Vital Signs    Vitals:   03/29/20 2225 03/29/20 2239 03/30/20 0600 03/30/20 0620  BP: (!) 120/100 (!) 105/94  (!) 140/110  Pulse: (!) 123 (!) 123 (!) 123   Resp: (!) 24 (!) 23 20   Temp:  97.8 F (36.6 C) 97.6 F (36.4 C)   TempSrc:  Oral Axillary   SpO2: 97% 96%    Weight:   69.9 kg   Height:        Intake/Output Summary (Last 24 hours) at 03/30/2020 0748 Last data filed at 03/30/2020 0324 Gross per 24 hour  Intake 1240.45 ml  Output 3015 ml  Net -1774.55 ml   Last 3 Weights 03/30/2020 03/29/2020 03/28/2020  Weight (lbs) 154 lb 3.2 oz 150 lb 9.6 oz 153  lb 14.1 oz  Weight (kg) 69.945 kg 68.312 kg 69.8 kg  Some encounter information is confidential and restricted. Go to Review Flowsheets activity to see all data.      Telemetry    2:1 atrial flutter - Personally Reviewed  ECG    2:1 atrial flutter - Personally Reviewed  Physical Exam   GEN: No acute distress.   Neck: No JVD Cardiac: tachycardic, no murmurs, rubs, or gallops.  Respiratory: Clear to auscultation bilaterally. GI: Soft, nontender, non-distended  MS: No edema; No deformity. Neuro:  Nonfocal  Psych: Normal affect   Labs    High Sensitivity Troponin:   Recent Labs  Lab 03/25/20 0957 03/25/20 1505  TROPONINIHS 64* 58*      Chemistry Recent Labs  Lab 03/25/20 0957 03/26/20 0820 03/28/20 0155 03/28/20 0155 03/28/20 0601 03/28/20 0601 03/29/20 0228 03/29/20 1806 03/30/20 0415  NA 136   < > 133*  133*   < > 134*   < > 131* 129* 128*  K 5.8*   < > 4.1  4.1   < > 4.2   < > 4.8 5.3* 5.7*  CL 91*   < > 94*  94*   < > 91*   < > 91* 89* 90*  CO2 27   < > 25  24   < >  26   < > 23 21* 21*  GLUCOSE 105*   < > 89  90   < > 99   < > 114* 124* 105*  BUN 80*   < > 38*  38*   < > 41*   < > 58* 72* 83*  CREATININE 9.74*   < > 5.41*  5.29*   < > 5.72*   < > 7.07* 8.39* 9.34*  CALCIUM 10.2   < > 9.8  9.8   < > 10.1   < > 9.9 10.0 10.1  PROT 6.9  --  6.1*  --  6.3*  --   --   --   --   ALBUMIN 2.8*   < > 2.4*  2.5*   < > 2.6*   < > 2.5* 2.6* 2.7*  AST 21  --  91*  --  87*  --   --   --   --   ALT 26  --  153*  --  154*  --   --   --   --   ALKPHOS 162*  --  166*  --  159*  --   --   --   --   BILITOT 0.8  --  1.0  --  0.7  --   --   --   --   GFRNONAA 6*   < > 12*  12*   < > 11*   < > 9* 7* 6*  ANIONGAP 18*   < > 14  15   < > 17*   < > 17* 19* 17*   < > = values in this interval not displayed.     Hematology Recent Labs  Lab 03/28/20 0601 03/29/20 0228 03/29/20 1806  WBC 6.2 6.7 7.3  RBC 3.58* 3.57* 3.66*  HGB 10.0* 10.2* 10.1*  HCT 33.6* 33.3*  33.6*  MCV 93.9 93.3 91.8  MCH 27.9 28.6 27.6  MCHC 29.8* 30.6 30.1  RDW 22.1* 22.2* 22.5*  PLT 223 233 208    BNPNo results for input(s): BNP, PROBNP in the last 168 hours.   DDimer No results for input(s): DDIMER in the last 168 hours.   Radiology    DG Chest 2 View  Result Date: 03/29/2020 CLINICAL DATA:  Shortness of breath and chest pain. EXAM: CHEST - 2 VIEW COMPARISON:  January 20, 2020 FINDINGS: Enlarged cardiac silhouette. Persistent streaky airspace consolidation with associated architectural distortion in the right upper lobe. Persistent to slightly worsened airspace consolidation in the right lower lobe. More prominent diffuse bilateral haziness of the lungs. Enlarged right pleural effusion. Osseous structures are without acute abnormality. Soft tissues are grossly normal. IMPRESSION: 1. Persistent streaky airspace consolidation with associated architectural distortion in the right upper lobe. 2. Persistent to slightly worsened airspace consolidation in the right lower lobe. 3. More prominent diffuse bilateral haziness of the lungs may represent interstitial pulmonary edema or other widespread infectious or inflammatory interstitial disease. 4. Increasing in size right pleural effusion. 5. Enlarged cardiac silhouette. Electronically Signed   By: Fidela Salisbury M.D.   On: 03/29/2020 15:53    Cardiac Studies   Echo 01/23/2020 1. Left ventricular ejection fraction, by estimation, is 50 to 55%. The  left ventricle has low normal function. The left ventricle has no regional  wall motion abnormalities. There is mild left ventricular hypertrophy.  Left ventricular diastolic function  could not be evaluated.  2. Right ventricular systolic function is normal. The right ventricular  size is normal. There is moderately elevated pulmonary artery systolic  pressure.  3. Left atrial size was severely dilated.  4. Right atrial size was mildly dilated.  5. A small pericardial  effusion is present.  6. The mitral valve is abnormal. Mild mitral valve regurgitation.  Moderate mitral stenosis. Severe mitral annular calcification.  7. The aortic valve is tricuspid. There is mild calcification of the  aortic valve. There is mild thickening of the aortic valve. Aortic valve  regurgitation is trivial. Mild aortic valve sclerosis is present, with no  evidence of aortic valve stenosis.  8. The inferior vena cava is dilated in size with <50% respiratory  variability, suggesting right atrial pressure of 15 mmHg.   Comparison(s): Changes from prior study are noted.   Patient Profile     53 y.o. male with PMH of COPD, tobacco abuse, h/o cocaine abuse, HTN, ESRD on HD (MWF), and recent COVID/PNA infection presented with persistent SOB and intermittent dizziness and found to be in afib with RVR. K on arrival was 5.8.   Assessment & Plan    1. Atrial fibrillation with RVR  - previously seen remotely in 2015 for HTN emergency, CHF and afib with RVR in the setting of missed dialysis  - recently admitted for hyperkalemia and PNA and had recurrent afib, anticoagulation was deferred in the setting of anemia  - he then presented with SOB and recurrent afib with RVR during this admission  - Echo 01/2020 showed EF 50-55%, no RWMA, moderate PAH, severe LAE, small pericardial effusion, mild MR and moderate MS  - started on Eliquis. Initially treated with IV diltiazem, this was transitioned to amiodarone due to soft BP.   - currently in 2:1 atrial flutter. TEE DCCV arranged for 11AM today. Consent was done yesterday according to staff message.   2. Atypical chest pain: trop flat, not consistent with ACS  - will reassess after cardioversion. Will remove aspirin given the need for Eliquis unless still symptomatic after cardioversion.   3. ESRD on HD MWF  4. Anemia  5. Tobacco and cocaine abuse  6. Mitral valve disease: TEE today      For questions or updates, please contact  [place] Please consult [email or web address] for contact info under        Signed, [name], Rodney  03/30/2020, 7:48 AM

## 2020-03-30 NOTE — Progress Notes (Addendum)
The patient has been seen in conjunction with Jeremy Mccoy, PAC. All aspects of care have been considered and discussed. The patient has been personally interviewed, examined, and all clinical data has been reviewed.   Agree with this note and plans as outlined.  Plan for TEE cardioversion 11 AM today.  Persistent acute on chronic diastolic heart failure related to atrial flutter, will hopefully improve with sinus rhythm.  Patient is very impatient.  I encouraged him to stay the course and allow [place] to optimize his cardiovascular state.   Progress Note  Patient Name: Jeremy Mccoy Date of Encounter: 03/30/2020  [facility] Cardiologist: Fransico Him, MD   Subjective   Still SOB and occasional chest tightness with exertion.   Inpatient Medications    Scheduled Meds: . apixaban  5 mg Oral BID  . Chlorhexidine Gluconate Cloth  6 each Topical Q0600  . Chlorhexidine Gluconate Cloth  6 each Topical Q0600  . nicotine  14 mg Transdermal Daily  . pantoprazole  40 mg Oral Daily  . sevelamer carbonate  2,400 mg Oral TID with meals  . sodium chloride flush  3 mL Intravenous Q12H   Continuous Infusions: . amiodarone 30 mg/hr (03/30/20 1321)   PRN Meds: acetaminophen, albuterol, famotidine, hydrOXYzine, sevelamer carbonate   Vital Signs    Vitals:   03/30/20 0600 03/30/20 0620 03/30/20 0750 03/30/20 1127  BP:  (!) 140/110 (!) 131/106 (!) 127/92  Pulse: (!) 123  (!) 122 (!) 121  Resp: 20  20 20   Temp: 97.6 F (36.4 C)  97.6 F (36.4 C) 98 F (36.7 C)  TempSrc: Axillary  Oral Oral  SpO2:   97% 100%  Weight: 69.9 kg   69.9 kg  Height:    6' (1.829 m)    Intake/Output Summary (Last 24 hours) at 03/30/2020 1700 Last data filed at 03/30/2020 0324 Gross per 24 hour  Intake 580.45 ml  Output 3015 ml  Net -2434.55 ml   Last 3 Weights 03/30/2020 03/30/2020 03/29/2020  Weight (lbs) 154 lb 1.6 oz 154 lb 3.2 oz 150 lb 9.6 oz  Weight (kg) 69.9 kg 69.945 kg 68.312 kg  Some  encounter information is confidential and restricted. Go to Review Flowsheets activity to see all data.      Telemetry    2:1 atrial flutter - Personally Reviewed  ECG    2:1 atrial flutter - Personally Reviewed  Physical Exam   GEN: No acute distress.   Neck: No JVD Cardiac: tachycardic, no murmurs, rubs, or gallops.  Respiratory: Clear to auscultation bilaterally. GI: Soft, nontender, non-distended  MS: No edema; No deformity. Neuro:  Nonfocal  Psych: Normal affect   Labs    High Sensitivity Troponin:   Recent Labs  Lab 03/25/20 0957 03/25/20 1505  TROPONINIHS 64* 58*      Chemistry Recent Labs  Lab 03/25/20 0957 03/26/20 0820 03/28/20 0155 03/28/20 0155 03/28/20 0601 03/28/20 0601 03/29/20 0228 03/29/20 0228 03/29/20 1806 03/30/20 0415 03/30/20 1206  NA 136   < > 133*  133*   < > 134*   < > 131*   < > 129* 128* 125*  K 5.8*   < > 4.1  4.1   < > 4.2   < > 4.8   < > 5.3* 5.7* 6.2*  CL 91*   < > 94*  94*   < > 91*   < > 91*   < > 89* 90* 94*  CO2 27   < >  25  24   < > 26   < > 23  --  21* 21*  --   GLUCOSE 105*   < > 89  90   < > 99   < > 114*   < > 124* 105* 90  BUN 80*   < > 38*  38*   < > 41*   < > 58*   < > 72* 83* 91*  CREATININE 9.74*   < > 5.41*  5.29*   < > 5.72*   < > 7.07*   < > 8.39* 9.34* 10.40*  CALCIUM 10.2   < > 9.8  9.8   < > 10.1   < > 9.9  --  10.0 10.1  --   PROT 6.9  --  6.1*  --  6.3*  --   --   --   --   --   --   ALBUMIN 2.8*   < > 2.4*  2.5*   < > 2.6*   < > 2.5*  --  2.6* 2.7*  --   AST 21  --  91*  --  87*  --   --   --   --   --   --   ALT 26  --  153*  --  154*  --   --   --   --   --   --   ALKPHOS 162*  --  166*  --  159*  --   --   --   --   --   --   BILITOT 0.8  --  1.0  --  0.7  --   --   --   --   --   --   GFRNONAA 6*   < > 12*  12*   < > 11*   < > 9*  --  7* 6*  --   ANIONGAP 18*   < > 14  15   < > 17*   < > 17*  --  19* 17*  --    < > = values in this interval not displayed.     Hematology Recent Labs    Lab 03/28/20 0601 03/28/20 0601 03/29/20 0228 03/29/20 1806 03/30/20 1206  WBC 6.2  --  6.7 7.3  --   RBC 3.58*  --  3.57* 3.66*  --   HGB 10.0*   < > 10.2* 10.1* 11.6*  HCT 33.6*   < > 33.3* 33.6* 34.0*  MCV 93.9  --  93.3 91.8  --   MCH 27.9  --  28.6 27.6  --   MCHC 29.8*  --  30.6 30.1  --   RDW 22.1*  --  22.2* 22.5*  --   PLT 223  --  233 208  --    < > = values in this interval not displayed.    BNPNo results for input(s): BNP, PROBNP in the last 168 hours.   DDimer No results for input(s): DDIMER in the last 168 hours.   Radiology    DG Chest 2 View  Result Date: 03/29/2020 CLINICAL DATA:  Shortness of breath and chest pain. EXAM: CHEST - 2 VIEW COMPARISON:  January 20, 2020 FINDINGS: Enlarged cardiac silhouette. Persistent streaky airspace consolidation with associated architectural distortion in the right upper lobe. Persistent to slightly worsened airspace consolidation in the right lower lobe. More prominent diffuse bilateral haziness of the lungs. Enlarged right pleural effusion. Osseous  structures are without acute abnormality. Soft tissues are grossly normal. IMPRESSION: 1. Persistent streaky airspace consolidation with associated architectural distortion in the right upper lobe. 2. Persistent to slightly worsened airspace consolidation in the right lower lobe. 3. More prominent diffuse bilateral haziness of the lungs may represent interstitial pulmonary edema or other widespread infectious or inflammatory interstitial disease. 4. Increasing in size right pleural effusion. 5. Enlarged cardiac silhouette. Electronically Signed   By: Fidela Salisbury M.D.   On: 03/29/2020 15:53    Cardiac Studies   Echo 01/23/2020 1. Left ventricular ejection fraction, by estimation, is 50 to 55%. The  left ventricle has low normal function. The left ventricle has no regional  wall motion abnormalities. There is mild left ventricular hypertrophy.  Left ventricular diastolic  function  could not be evaluated.  2. Right ventricular systolic function is normal. The right ventricular  size is normal. There is moderately elevated pulmonary artery systolic  pressure.  3. Left atrial size was severely dilated.  4. Right atrial size was mildly dilated.  5. A small pericardial effusion is present.  6. The mitral valve is abnormal. Mild mitral valve regurgitation.  Moderate mitral stenosis. Severe mitral annular calcification.  7. The aortic valve is tricuspid. There is mild calcification of the  aortic valve. There is mild thickening of the aortic valve. Aortic valve  regurgitation is trivial. Mild aortic valve sclerosis is present, with no  evidence of aortic valve stenosis.  8. The inferior vena cava is dilated in size with <50% respiratory  variability, suggesting right atrial pressure of 15 mmHg.   Comparison(s): Changes from prior study are noted.   Patient Profile     54 y.o. male with PMH of COPD, tobacco abuse, h/o cocaine abuse, HTN, ESRD on HD (MWF), and recent COVID/PNA infection presented with persistent SOB and intermittent dizziness and found to be in afib with RVR. K on arrival was 5.8.   Assessment & Plan    1. Atrial fibrillation with RVR  - previously seen remotely in 2015 for HTN emergency, CHF and afib with RVR in the setting of missed dialysis  - recently admitted for hyperkalemia and PNA and had recurrent afib, anticoagulation was deferred in the setting of anemia  - he then presented with SOB and recurrent afib with RVR during this admission  - Echo 01/2020 showed EF 50-55%, no RWMA, moderate PAH, severe LAE, small pericardial effusion, mild MR and moderate MS  - started on Eliquis. Initially treated with IV diltiazem, this was transitioned to amiodarone due to soft BP.   - currently in 2:1 atrial flutter. TEE DCCV arranged for 11AM today. Consent was done yesterday according to staff message.   2. Atypical chest pain: trop flat, not  consistent with ACS  - will reassess after cardioversion. Will remove aspirin given the need for Eliquis unless still symptomatic after cardioversion.   3. ESRD on HD MWF  4. Anemia  5. Tobacco and cocaine abuse  6. Mitral valve disease: TEE today      For questions or updates, please contact [place] Please consult [email or web address] for contact info under        Signed, Sinclair Grooms, MD  03/30/2020, 5:00 PM

## 2020-03-30 NOTE — Telephone Encounter (Signed)
Thank you for update. Looking through things he is dealing with quite a bit. I would say that a clinic visit is needed at this time and we will eventually need to get him off anticoagulation (which could be possible if he cardioverts and stays out of Afib). Let's plan a procedure no sooner than late January/February. He should be seen in clinic by one of our APPs or myself in January. Thanks. GM

## 2020-03-30 NOTE — Interval H&P Note (Signed)
History and Physical Interval Note:  03/30/2020 11:30 AM  Jeremy Mccoy  has presented today for surgery, with the diagnosis of afib.  The various methods of treatment have been discussed with the patient and family. After consideration of risks, benefits and other options for treatment, the patient has consented to  Procedure(s): CARDIOVERSION (N/A) TRANSESOPHAGEAL ECHOCARDIOGRAM (TEE) (N/A) as a surgical intervention.  The patient's history has been reviewed, patient examined, no change in status, stable for surgery.  I have reviewed the patient's chart and labs.  Questions were answered to the patient's satisfaction.     Ena Dawley

## 2020-03-31 ENCOUNTER — Encounter (HOSPITAL_COMMUNITY)
Admission: EM | Disposition: A | Discharge status: Home / Self Care | Payer: Self-pay | Source: Home / Self Care | Attending: Family Medicine

## 2020-03-31 ENCOUNTER — Inpatient Hospital Stay (HOSPITAL_COMMUNITY): Payer: Medicare HMO

## 2020-03-31 ENCOUNTER — Inpatient Hospital Stay (HOSPITAL_COMMUNITY): Payer: Medicare HMO | Admitting: Certified Registered"

## 2020-03-31 ENCOUNTER — Encounter (HOSPITAL_COMMUNITY): Payer: Self-pay | Admitting: Family Medicine

## 2020-03-31 DIAGNOSIS — I342 Nonrheumatic mitral (valve) stenosis: Secondary | ICD-10-CM | POA: Diagnosis not present

## 2020-03-31 DIAGNOSIS — I4892 Unspecified atrial flutter: Secondary | ICD-10-CM | POA: Diagnosis not present

## 2020-03-31 DIAGNOSIS — I340 Nonrheumatic mitral (valve) insufficiency: Secondary | ICD-10-CM

## 2020-03-31 DIAGNOSIS — R0602 Shortness of breath: Secondary | ICD-10-CM | POA: Diagnosis not present

## 2020-03-31 DIAGNOSIS — I351 Nonrheumatic aortic (valve) insufficiency: Secondary | ICD-10-CM | POA: Diagnosis not present

## 2020-03-31 DIAGNOSIS — I484 Atypical atrial flutter: Secondary | ICD-10-CM | POA: Diagnosis not present

## 2020-03-31 DIAGNOSIS — I361 Nonrheumatic tricuspid (valve) insufficiency: Secondary | ICD-10-CM

## 2020-03-31 DIAGNOSIS — E875 Hyperkalemia: Secondary | ICD-10-CM | POA: Diagnosis not present

## 2020-03-31 HISTORY — PX: TEE WITHOUT CARDIOVERSION: SHX5443

## 2020-03-31 HISTORY — PX: CARDIOVERSION: SHX1299

## 2020-03-31 LAB — BASIC METABOLIC PANEL
Anion gap: 16 — ABNORMAL HIGH (ref 5–15)
BUN: 38 mg/dL — ABNORMAL HIGH (ref 6–20)
CO2: 26 mmol/L (ref 22–32)
Calcium: 9.3 mg/dL (ref 8.9–10.3)
Chloride: 92 mmol/L — ABNORMAL LOW (ref 98–111)
Creatinine, Ser: 5.57 mg/dL — ABNORMAL HIGH (ref 0.61–1.24)
GFR, Estimated: 11 mL/min — ABNORMAL LOW (ref >60)
Glucose, Bld: 92 mg/dL (ref 70–99)
Potassium: 4.2 mmol/L (ref 3.5–5.1)
Sodium: 134 mmol/L — ABNORMAL LOW (ref 135–145)

## 2020-03-31 LAB — RENAL FUNCTION PANEL
Albumin: 2.6 g/dL — ABNORMAL LOW (ref 3.5–5.0)
Anion gap: 23 — ABNORMAL HIGH (ref 5–15)
BUN: 108 mg/dL — ABNORMAL HIGH (ref 6–20)
CO2: 18 mmol/L — ABNORMAL LOW (ref 22–32)
Calcium: 10.1 mg/dL (ref 8.9–10.3)
Chloride: 88 mmol/L — ABNORMAL LOW (ref 98–111)
Creatinine, Ser: 10.75 mg/dL — ABNORMAL HIGH (ref 0.61–1.24)
GFR, Estimated: 5 mL/min — ABNORMAL LOW (ref >60)
Glucose, Bld: 114 mg/dL — ABNORMAL HIGH (ref 70–99)
Phosphorus: 6.6 mg/dL — ABNORMAL HIGH (ref 2.5–4.6)
Potassium: 6 mmol/L — ABNORMAL HIGH (ref 3.5–5.1)
Sodium: 129 mmol/L — ABNORMAL LOW (ref 135–145)

## 2020-03-31 LAB — CBC
HCT: 30.5 % — ABNORMAL LOW (ref 39.0–52.0)
Hemoglobin: 9.5 g/dL — ABNORMAL LOW (ref 13.0–17.0)
MCH: 28.1 pg (ref 26.0–34.0)
MCHC: 31.1 g/dL (ref 30.0–36.0)
MCV: 90.2 fL (ref 80.0–100.0)
Platelets: 210 10*3/uL (ref 150–400)
RBC: 3.38 MIL/uL — ABNORMAL LOW (ref 4.22–5.81)
RDW: 22.4 % — ABNORMAL HIGH (ref 11.5–15.5)
WBC: 7.8 10*3/uL (ref 4.0–10.5)
nRBC: 0 % (ref 0.0–0.2)

## 2020-03-31 LAB — ECHO TEE
MV M vel: 4.89 m/s
MV Peak grad: 95.6 mmHg
Radius: 0.6 cm

## 2020-03-31 SURGERY — ECHOCARDIOGRAM, TRANSESOPHAGEAL
Anesthesia: Monitor Anesthesia Care

## 2020-03-31 MED ORDER — PROPOFOL 10 MG/ML IV BOLUS
INTRAVENOUS | Status: DC | PRN
  Administered 2020-03-31 (×4): 20 mg via INTRAVENOUS

## 2020-03-31 MED ORDER — PROPOFOL 500 MG/50ML IV EMUL
INTRAVENOUS | Status: DC | PRN
  Administered 2020-03-31: 150 ug/kg/min via INTRAVENOUS

## 2020-03-31 MED ORDER — SODIUM CHLORIDE 0.9 % IV SOLN: INTRAVENOUS | Status: DC | PRN

## 2020-03-31 NOTE — Progress Notes (Signed)
    Transesophageal Echocardiogram Note  Jeremy Mccoy [id number] Jul 16, 1965  Procedure: Transesophageal Echocardiogram Indications: Atrial flutter  Procedure Details Consent: Obtained Time Out: Verified patient identification, verified procedure, site/side was marked, verified correct patient position, special equipment/implants available, Radiology Safety Procedures followed,  medications/allergies/relevent history reviewed, required imaging and test results available.  Performed  Medications:  Pt sedated by anesthesia with diprovan 280 mg IV total.  Moderate to severe LV dysfunction; severe LAE; no LAA thrombus; moderate RAE and RVE; moderate RV dysfunction; mild AI; mild MS (mean gradient 6 mmHg); moderate MR; moderate TR.  Pt subsequently underwent DCCV with 120J to sinus rhythm. Continue anticoagulation.   Complications: No apparent complications Patient did tolerate procedure well.  Kirk Ruths, MD

## 2020-03-31 NOTE — H&P (View-Only) (Signed)
The patient has been seen in conjunction with Harlan Stains, NP. All aspects of care have been considered and discussed. The patient has been personally interviewed, examined, and all clinical data has been reviewed.   Stable post dialysis.  Potassium prior to dialysis was 6.0.  A postdialysis potassium is being repeated.  He is in continuous atrial flutter with heart rate of approximately 135 bpm  Plan for TEE guided electrical cardioversion.  Continue IV amnio and convert to oral after successful cardioversion.  Could potentially be ready for discharge by tomorrow.   Progress Note  Patient Name: Jeremy Mccoy Date of Encounter: 03/31/2020  [facility] Cardiologist: Fransico Him, MD   Subjective   Sitting up in the side of the bed. No chest pain this morning.   Inpatient Medications    Scheduled Meds: . apixaban  5 mg Oral BID  . Chlorhexidine Gluconate Cloth  6 each Topical Q0600  . Chlorhexidine Gluconate Cloth  6 each Topical Q0600  . nicotine  14 mg Transdermal Daily  . pantoprazole  40 mg Oral Daily  . sevelamer carbonate  2,400 mg Oral TID with meals  . sodium chloride flush  3 mL Intravenous Q12H   Continuous Infusions: . amiodarone 30 mg/hr (03/31/20 0019)   PRN Meds: acetaminophen, albuterol, famotidine, hydrOXYzine, sevelamer carbonate   Vital Signs    Vitals:   03/31/20 0700 03/31/20 0722 03/31/20 0736 03/31/20 0813  BP: 98/80 101/77  104/84  Pulse:  (!) 131 (!) 131 (!) 132  Resp:  (!) 25 (!) 22 (!) 21  Temp:  97.7 F (36.5 C)  (!) 97.4 F (36.3 C)  TempSrc:  Oral  Oral  SpO2:  96% 96% 99%  Weight:      Height:        Intake/Output Summary (Last 24 hours) at 03/31/2020 0943 Last data filed at 03/31/2020 1937 Gross per 24 hour  Intake 227.57 ml  Output 3000 ml  Net -2772.43 ml   Last 3 Weights 03/31/2020 03/30/2020 03/30/2020  Weight (lbs) 156 lb 12 oz 154 lb 1.6 oz 154 lb 3.2 oz  Weight (kg) 71.1 kg 69.9 kg 69.945 kg  Some  encounter information is confidential and restricted. Go to Review Flowsheets activity to see all data.      Telemetry    2:1 Atrial flutter - Personally Reviewed  ECG    No new tracing  Physical Exam  Pleasant male, sitting up on the side of the bed GEN: No acute distress.   Neck: No JVD Cardiac: tachy, no murmurs, rubs, or gallops.  Respiratory: Clear to auscultation bilaterally. GI: Soft, nontender, non-distended  MS: No edema; No deformity. Neuro:  Nonfocal  Psych: Normal affect   Labs    High Sensitivity Troponin:   Recent Labs  Lab 03/25/20 0957 03/25/20 1505  TROPONINIHS 64* 58*      Chemistry Recent Labs  Lab 03/25/20 0957 03/26/20 0820 03/28/20 0155 03/28/20 0155 03/28/20 0601 03/29/20 0228 03/29/20 1806 03/29/20 1806 03/30/20 0415 03/30/20 1206 03/31/20 0320  NA 136   < > 133*  133*   < > 134*   < > 129*   < > 128* 125* 129*  K 5.8*   < > 4.1  4.1   < > 4.2   < > 5.3*   < > 5.7* 6.2* 6.0*  CL 91*   < > 94*  94*   < > 91*   < > 89*   < > 90* 94*  88*  CO2 27   < > 25  24   < > 26   < > 21*  --  21*  --  18*  GLUCOSE 105*   < > 89  90   < > 99   < > 124*   < > 105* 90 114*  BUN 80*   < > 38*  38*   < > 41*   < > 72*   < > 83* 91* 108*  CREATININE 9.74*   < > 5.41*  5.29*   < > 5.72*   < > 8.39*   < > 9.34* 10.40* 10.75*  CALCIUM 10.2   < > 9.8  9.8   < > 10.1   < > 10.0  --  10.1  --  10.1  PROT 6.9  --  6.1*  --  6.3*  --   --   --   --   --   --   ALBUMIN 2.8*   < > 2.4*  2.5*   < > 2.6*   < > 2.6*  --  2.7*  --  2.6*  AST 21  --  91*  --  87*  --   --   --   --   --   --   ALT 26  --  153*  --  154*  --   --   --   --   --   --   ALKPHOS 162*  --  166*  --  159*  --   --   --   --   --   --   BILITOT 0.8  --  1.0  --  0.7  --   --   --   --   --   --   GFRNONAA 6*   < > 12*  12*   < > 11*   < > 7*  --  6*  --  5*  ANIONGAP 18*   < > 14  15   < > 17*   < > 19*  --  17*  --  23*   < > = values in this interval not displayed.      Hematology Recent Labs  Lab 03/29/20 0228 03/29/20 0228 03/29/20 1806 03/30/20 1206 03/31/20 0320  WBC 6.7  --  7.3  --  7.8  RBC 3.57*  --  3.66*  --  3.38*  HGB 10.2*   < > 10.1* 11.6* 9.5*  HCT 33.3*   < > 33.6* 34.0* 30.5*  MCV 93.3  --  91.8  --  90.2  MCH 28.6  --  27.6  --  28.1  MCHC 30.6  --  30.1  --  31.1  RDW 22.2*  --  22.5*  --  22.4*  PLT 233  --  208  --  210   < > = values in this interval not displayed.    BNPNo results for input(s): BNP, PROBNP in the last 168 hours.   DDimer No results for input(s): DDIMER in the last 168 hours.   Radiology    DG Chest 2 View  Result Date: 03/29/2020 CLINICAL DATA:  Shortness of breath and chest pain. EXAM: CHEST - 2 VIEW COMPARISON:  January 20, 2020 FINDINGS: Enlarged cardiac silhouette. Persistent streaky airspace consolidation with associated architectural distortion in the right upper lobe. Persistent to slightly worsened airspace consolidation in the right lower lobe. More prominent diffuse bilateral haziness of  the lungs. Enlarged right pleural effusion. Osseous structures are without acute abnormality. Soft tissues are grossly normal. IMPRESSION: 1. Persistent streaky airspace consolidation with associated architectural distortion in the right upper lobe. 2. Persistent to slightly worsened airspace consolidation in the right lower lobe. 3. More prominent diffuse bilateral haziness of the lungs may represent interstitial pulmonary edema or other widespread infectious or inflammatory interstitial disease. 4. Increasing in size right pleural effusion. 5. Enlarged cardiac silhouette. Electronically Signed   By: Fidela Salisbury M.D.   On: 03/29/2020 15:53    Cardiac Studies   Echocardiogram 01/23/20:  1. Left ventricular ejection fraction, by estimation, is 50 to 55%. The  left ventricle has low normal function. The left ventricle has no regional  wall motion abnormalities. There is mild left ventricular  hypertrophy.  Left ventricular diastolic function  could not be evaluated.  2. Right ventricular systolic function is normal. The right ventricular  size is normal. There is moderately elevated pulmonary artery systolic  pressure.  3. Left atrial size was severely dilated.  4. Right atrial size was mildly dilated.  5. A small pericardial effusion is present.  6. The mitral valve is abnormal. Mild mitral valve regurgitation.  Moderate mitral stenosis. Severe mitral annular calcification.  7. The aortic valve is tricuspid. There is mild calcification of the  aortic valve. There is mild thickening of the aortic valve. Aortic valve  regurgitation is trivial. Mild aortic valve sclerosis is present, with no  evidence of aortic valve stenosis.  8. The inferior vena cava is dilated in size with <50% respiratory  variability, suggesting right atrial pressure of 15 mmHg.   Comparison(s): Changes from prior study are noted.   Conclusion(s)/Recommendation(s): Severely calcified mitral valve with at  least moderate mitral stenosis. Highly variable heart rate between 90-120  bpm.   Patient Profile     54 y.o. male  with a hx of COPD with ongoing tobacco use, hx of cocaine use, HTN, ESRD on HD (MWF) and recentCOVID/PNAinfection seen for afib/aflutter RVR.   Assessment & Plan    1. 2:1 Aflutter: rates remain elevated in the 130s. Initially treated with IV Dilt but transitioned to amiodarone 2/2 soft blood pressures. Continues on IV amiodarone, along with Eliquis 5mg  BID. Planned for TEE/DCCV today around 2pm. Further recommendations post cardioversion.   2. Atypical chest pain: No significant chest pain this morning  3. ESRD on HD MWF: had HD this morning and tolerated well. Nephrology following.  4. Hyperkalemia: labs at 3am with K+ 6.0 prior to HD. Will plan for repeat BMET now prior to TEE/DCCV today  5. Tobacco/cocaine use: cessation advised  6 . Mitral valve disease: echo  01/2020 with moderate stenosis, and severe annular calcification. TEE today   For questions or updates, please contact [place] Please consult [email or web address] for contact info under        Signed, Reino Bellis, NP  03/31/2020, 9:43 AM

## 2020-03-31 NOTE — Progress Notes (Addendum)
The patient has been seen in conjunction with Harlan Stains, NP. All aspects of care have been considered and discussed. The patient has been personally interviewed, examined, and all clinical data has been reviewed.   Stable post dialysis.  Potassium prior to dialysis was 6.0.  A postdialysis potassium is being repeated.  He is in continuous atrial flutter with heart rate of approximately 135 bpm  Plan for TEE guided electrical cardioversion.  Continue IV amnio and convert to oral after successful cardioversion.  Could potentially be ready for discharge by tomorrow.   Progress Note  Patient Name: Jeremy Mccoy Date of Encounter: 03/31/2020  [facility] Cardiologist: Fransico Him, MD   Subjective   Sitting up in the side of the bed. No chest pain this morning.   Inpatient Medications    Scheduled Meds: . apixaban  5 mg Oral BID  . Chlorhexidine Gluconate Cloth  6 each Topical Q0600  . Chlorhexidine Gluconate Cloth  6 each Topical Q0600  . nicotine  14 mg Transdermal Daily  . pantoprazole  40 mg Oral Daily  . sevelamer carbonate  2,400 mg Oral TID with meals  . sodium chloride flush  3 mL Intravenous Q12H   Continuous Infusions: . amiodarone 30 mg/hr (03/31/20 0019)   PRN Meds: acetaminophen, albuterol, famotidine, hydrOXYzine, sevelamer carbonate   Vital Signs    Vitals:   03/31/20 0700 03/31/20 0722 03/31/20 0736 03/31/20 0813  BP: 98/80 101/77  104/84  Pulse:  (!) 131 (!) 131 (!) 132  Resp:  (!) 25 (!) 22 (!) 21  Temp:  97.7 F (36.5 C)  (!) 97.4 F (36.3 C)  TempSrc:  Oral  Oral  SpO2:  96% 96% 99%  Weight:      Height:        Intake/Output Summary (Last 24 hours) at 03/31/2020 0943 Last data filed at 03/31/2020 6237 Gross per 24 hour  Intake 227.57 ml  Output 3000 ml  Net -2772.43 ml   Last 3 Weights 03/31/2020 03/30/2020 03/30/2020  Weight (lbs) 156 lb 12 oz 154 lb 1.6 oz 154 lb 3.2 oz  Weight (kg) 71.1 kg 69.9 kg 69.945 kg  Some  encounter information is confidential and restricted. Go to Review Flowsheets activity to see all data.      Telemetry    2:1 Atrial flutter - Personally Reviewed  ECG    No new tracing  Physical Exam  Pleasant male, sitting up on the side of the bed GEN: No acute distress.   Neck: No JVD Cardiac: tachy, no murmurs, rubs, or gallops.  Respiratory: Clear to auscultation bilaterally. GI: Soft, nontender, non-distended  MS: No edema; No deformity. Neuro:  Nonfocal  Psych: Normal affect   Labs    High Sensitivity Troponin:   Recent Labs  Lab 03/25/20 0957 03/25/20 1505  TROPONINIHS 64* 58*      Chemistry Recent Labs  Lab 03/25/20 0957 03/26/20 0820 03/28/20 0155 03/28/20 0155 03/28/20 0601 03/29/20 0228 03/29/20 1806 03/29/20 1806 03/30/20 0415 03/30/20 1206 03/31/20 0320  NA 136   < > 133*  133*   < > 134*   < > 129*   < > 128* 125* 129*  K 5.8*   < > 4.1  4.1   < > 4.2   < > 5.3*   < > 5.7* 6.2* 6.0*  CL 91*   < > 94*  94*   < > 91*   < > 89*   < > 90* 94*  88*  CO2 27   < > 25  24   < > 26   < > 21*  --  21*  --  18*  GLUCOSE 105*   < > 89  90   < > 99   < > 124*   < > 105* 90 114*  BUN 80*   < > 38*  38*   < > 41*   < > 72*   < > 83* 91* 108*  CREATININE 9.74*   < > 5.41*  5.29*   < > 5.72*   < > 8.39*   < > 9.34* 10.40* 10.75*  CALCIUM 10.2   < > 9.8  9.8   < > 10.1   < > 10.0  --  10.1  --  10.1  PROT 6.9  --  6.1*  --  6.3*  --   --   --   --   --   --   ALBUMIN 2.8*   < > 2.4*  2.5*   < > 2.6*   < > 2.6*  --  2.7*  --  2.6*  AST 21  --  91*  --  87*  --   --   --   --   --   --   ALT 26  --  153*  --  154*  --   --   --   --   --   --   ALKPHOS 162*  --  166*  --  159*  --   --   --   --   --   --   BILITOT 0.8  --  1.0  --  0.7  --   --   --   --   --   --   GFRNONAA 6*   < > 12*  12*   < > 11*   < > 7*  --  6*  --  5*  ANIONGAP 18*   < > 14  15   < > 17*   < > 19*  --  17*  --  23*   < > = values in this interval not displayed.      Hematology Recent Labs  Lab 03/29/20 0228 03/29/20 0228 03/29/20 1806 03/30/20 1206 03/31/20 0320  WBC 6.7  --  7.3  --  7.8  RBC 3.57*  --  3.66*  --  3.38*  HGB 10.2*   < > 10.1* 11.6* 9.5*  HCT 33.3*   < > 33.6* 34.0* 30.5*  MCV 93.3  --  91.8  --  90.2  MCH 28.6  --  27.6  --  28.1  MCHC 30.6  --  30.1  --  31.1  RDW 22.2*  --  22.5*  --  22.4*  PLT 233  --  208  --  210   < > = values in this interval not displayed.    BNPNo results for input(s): BNP, PROBNP in the last 168 hours.   DDimer No results for input(s): DDIMER in the last 168 hours.   Radiology    DG Chest 2 View  Result Date: 03/29/2020 CLINICAL DATA:  Shortness of breath and chest pain. EXAM: CHEST - 2 VIEW COMPARISON:  January 20, 2020 FINDINGS: Enlarged cardiac silhouette. Persistent streaky airspace consolidation with associated architectural distortion in the right upper lobe. Persistent to slightly worsened airspace consolidation in the right lower lobe. More prominent diffuse bilateral haziness of  the lungs. Enlarged right pleural effusion. Osseous structures are without acute abnormality. Soft tissues are grossly normal. IMPRESSION: 1. Persistent streaky airspace consolidation with associated architectural distortion in the right upper lobe. 2. Persistent to slightly worsened airspace consolidation in the right lower lobe. 3. More prominent diffuse bilateral haziness of the lungs may represent interstitial pulmonary edema or other widespread infectious or inflammatory interstitial disease. 4. Increasing in size right pleural effusion. 5. Enlarged cardiac silhouette. Electronically Signed   By: Fidela Salisbury M.D.   On: 03/29/2020 15:53    Cardiac Studies   Echocardiogram 01/23/20:  1. Left ventricular ejection fraction, by estimation, is 50 to 55%. The  left ventricle has low normal function. The left ventricle has no regional  wall motion abnormalities. There is mild left ventricular  hypertrophy.  Left ventricular diastolic function  could not be evaluated.  2. Right ventricular systolic function is normal. The right ventricular  size is normal. There is moderately elevated pulmonary artery systolic  pressure.  3. Left atrial size was severely dilated.  4. Right atrial size was mildly dilated.  5. A small pericardial effusion is present.  6. The mitral valve is abnormal. Mild mitral valve regurgitation.  Moderate mitral stenosis. Severe mitral annular calcification.  7. The aortic valve is tricuspid. There is mild calcification of the  aortic valve. There is mild thickening of the aortic valve. Aortic valve  regurgitation is trivial. Mild aortic valve sclerosis is present, with no  evidence of aortic valve stenosis.  8. The inferior vena cava is dilated in size with <50% respiratory  variability, suggesting right atrial pressure of 15 mmHg.   Comparison(s): Changes from prior study are noted.   Conclusion(s)/Recommendation(s): Severely calcified mitral valve with at  least moderate mitral stenosis. Highly variable heart rate between 90-120  bpm.   Patient Profile     54 y.o. male  with a hx of COPD with ongoing tobacco use, hx of cocaine use, HTN, ESRD on HD (MWF) and recentCOVID/PNAinfection seen for afib/aflutter RVR.   Assessment & Plan    1. 2:1 Aflutter: rates remain elevated in the 130s. Initially treated with IV Dilt but transitioned to amiodarone 2/2 soft blood pressures. Continues on IV amiodarone, along with Eliquis 5mg  BID. Planned for TEE/DCCV today around 2pm. Further recommendations post cardioversion.   2. Atypical chest pain: No significant chest pain this morning  3. ESRD on HD MWF: had HD this morning and tolerated well. Nephrology following.  4. Hyperkalemia: labs at 3am with K+ 6.0 prior to HD. Will plan for repeat BMET now prior to TEE/DCCV today  5. Tobacco/cocaine use: cessation advised  6 . Mitral valve disease: echo  01/2020 with moderate stenosis, and severe annular calcification. TEE today   For questions or updates, please contact [place] Please consult [email or web address] for contact info under        Signed, Reino Bellis, NP  03/31/2020, 9:43 AM

## 2020-03-31 NOTE — Transfer of Care (Signed)
Immediate Anesthesia Transfer of Care Note  Patient: Jeremy Mccoy  Procedure(s) Performed: TRANSESOPHAGEAL ECHOCARDIOGRAM (TEE) (N/A ) CARDIOVERSION (N/A )  Patient Location: Endoscopy Unit  Anesthesia Type:MAC  Level of Consciousness: awake, alert  and oriented  Airway & Oxygen Therapy: Patient Spontanous Breathing and Patient connected to face mask oxygen  Post-op Assessment: Report given to RN, Post -op Vital signs reviewed and stable and Patient moving all extremities  Post vital signs: Reviewed and stable  Last Vitals:  Vitals Value Taken Time  BP 147/73 03/31/20 1442  Temp    Pulse 104 03/31/20 1442  Resp 32 03/31/20 1442  SpO2 98 % 03/31/20 1442    Last Pain:  Vitals:   03/31/20 1348  TempSrc: Temporal  PainSc: 0-No pain      Patients Stated Pain Goal: 0 (19/37/90 2409)  Complications: No complications documented.

## 2020-03-31 NOTE — Progress Notes (Signed)
  Echocardiogram Echocardiogram Transesophageal has been performed.  Jennette Dubin 03/31/2020, 2:53 PM

## 2020-03-31 NOTE — Progress Notes (Signed)
   Post cardioversion, maintaining NSR  Other vital signs are stable.  Probable DC tomorrow.

## 2020-03-31 NOTE — Progress Notes (Signed)
[facility] Progress Note   Subjective:    Had dialysis early this am another 3L removed Cardioversion cancelled yesterday d/t hyperkalemia. Repeat labs pending.   Objective Vitals:   03/31/20 0700 03/31/20 0722 03/31/20 0736 03/31/20 0813  BP: 98/80 101/77  104/84  Pulse:  (!) 131 (!) 131 (!) 132  Resp:  (!) 25 (!) 22 (!) 21  Temp:  97.7 F (36.5 C)  (!) 97.4 F (36.3 C)  TempSrc:  Oral  Oral  SpO2:  96% 96% 99%  Weight:      Height:       Physical Exam General:chronically ill appearing male in NAD Heart: Tachy, regular  Lungs: clear, bilaterally, improved since admission  Abdomen:soft, NTND Extremities:no LE edema Dialysis Access: LU AVF +b/t   Filed Weights   03/30/20 0600 03/30/20 1127 03/31/20 0313  Weight: 69.9 kg 69.9 kg 71.1 kg    Intake/Output Summary (Last 24 hours) at 03/31/2020 1122 Last data filed at 03/31/2020 0569 Gross per 24 hour  Intake 227.57 ml  Output 3000 ml  Net -2772.43 ml    Additional Objective Labs: Basic Metabolic Panel: Recent Labs  Lab 03/29/20 1806 03/29/20 1806 03/30/20 0415 03/30/20 1206 03/31/20 0320  NA 129*   < > 128* 125* 129*  K 5.3*   < > 5.7* 6.2* 6.0*  CL 89*   < > 90* 94* 88*  CO2 21*  --  21*  --  18*  GLUCOSE 124*   < > 105* 90 114*  BUN 72*   < > 83* 91* 108*  CREATININE 8.39*   < > 9.34* 10.40* 10.75*  CALCIUM 10.0  --  10.1  --  10.1  PHOS 5.6*  --  6.0*  --  6.6*   < > = values in this interval not displayed.   Liver Function Tests: Recent Labs  Lab 03/25/20 0957 03/26/20 0820 03/28/20 0155 03/28/20 0155 03/28/20 0601 03/29/20 0228 03/29/20 1806 03/30/20 0415 03/31/20 0320  AST 21  --  91*  --  87*  --   --   --   --   ALT 26  --  153*  --  154*  --   --   --   --   ALKPHOS 162*  --  166*  --  159*  --   --   --   --   BILITOT 0.8  --  1.0  --  0.7  --   --   --   --   PROT 6.9  --  6.1*  --  6.3*  --   --   --   --   ALBUMIN 2.8*   < > 2.4*  2.5*   < > 2.6*   < > 2.6*  2.7* 2.6*   < > = values in this interval not displayed.   Recent Labs  Lab 03/25/20 0957  LIPASE 24   CBC: Recent Labs  Lab 03/25/20 0957 03/26/20 0820 03/27/20 0255 03/27/20 0255 03/28/20 0601 03/28/20 0601 03/29/20 0228 03/29/20 0228 03/29/20 1806 03/30/20 1206 03/31/20 0320  WBC 5.4   < > 6.4   < > 6.2   < > 6.7  --  7.3  --  7.8  NEUTROABS 4.0  --   --   --   --   --   --   --   --   --   --   HGB 10.2*   < > 9.9*   < >  10.0*   < > 10.2*   < > 10.1* 11.6* 9.5*  HCT 35.2*   < > 33.5*   < > 33.6*   < > 33.3*   < > 33.6* 34.0* 30.5*  MCV 96.2   < > 93.6  --  93.9  --  93.3  --  91.8  --  90.2  PLT 244   < > 236   < > 223   < > 233  --  208  --  210   < > = values in this interval not displayed.    Medications: . amiodarone 30 mg/hr (03/31/20 0019)   . apixaban  5 mg Oral BID  . Chlorhexidine Gluconate Cloth  6 each Topical Q0600  . Chlorhexidine Gluconate Cloth  6 each Topical Q0600  . nicotine  14 mg Transdermal Daily  . pantoprazole  40 mg Oral Daily  . sevelamer carbonate  2,400 mg Oral TID with meals  . sodium chloride flush  3 mL Intravenous Q12H    Dialysis Orders: AF MWF 4h 63min 500/800 66.5kg 1K/2.5Ca  AVF No heparin Hectorol 3 TIW Parsabiv 15 Mircera 225 (last 11/8)  Assessment/Plan: 1. AFib w RVR on admission --A flutter w/RVR ongoing. Lopressor and cardizem stopped due to hypotension.Amiodarone started on 11/12.  Eliquis started. Percardiology. To have TEE/DCCV today pending labs.  2. ESRD - HD MWF. Truncates all treatments as outpatient.HD off scheduleThurs.UF limited friday due to hypotension. Had  extra HD on 11/13 and 11/14  due to volume. Back on schedule this week. Next HD 11/17.  3. Hyperkalemia -chronic issue. On1K bath +weekly Veltassa as outpatient. Loklema ordered here.Admits eating outside food. Continue renal diet.  4. Hypertension/volume - BP ok. Chronic volume overload. Likely pulm edema on CXR on admission. Volume has  improved with extra HD. Had extra HD x2 over weekend. Follow weights 5. Anemia - Hgb 10.2. Recent ESA as outpatient. Follow.H/o GI bleeding -to see GI as outpatient 6. Metabolic bone disease - Ca elevated phos in goal. Continue binders. Will hold hectorol for now due to increasing Ca. Use low Ca bath. Parsabiv not on hospital formulary 7. Nutrition - renal diet w/fluid restrictions  Lynnda Child PA-C [facility] 03/31/2020,11:22 AM

## 2020-03-31 NOTE — Progress Notes (Signed)
Family Medicine Teaching Service Daily Progress Note Intern Pager: [phone]  Patient name: Jeremy Mccoy Medical record number: [id number] Date of birth: 05-20-1965 Age: 54 y.o. Gender: male  Primary Care Provider: Patient, No Pcp Per Consultants: Cardiology, nephro Code Status: Full  Pt Overview and Major Events to Date:  Admitted 11/10  Assessment and Plan: Sahand Gosch McMahanis a 54 y.o.malepresenting with SOB and dizziness, found to be in A. Flutter with RVR. PMH is significant forESRD on HD MWF, a-fibwith RVR,HTN,COPD, CHF, tobacco use,prior cocaine abuse,recent COVID,andmedication noncompliance.  A-Flutter with RVR Overnight HR 118-132. CHADSVASCs score is 1 for HTN. Just received HD this morning. Awaiting repeat CMP; if K+ normalized s/p HD, plan for TEE with cardioversion later this morning with cards.  -Cardiology following, appreciate continued involvement and recs -Continuous telemetry, monitor heart rate -Continue Amiodarone gtt, Eliquis 5 mg bid, asa 81 mg daily -Follow up cardiology outpatient to determine long-term anticoagulation  ESRD Chronic and stable. Cr10.75 early this morning before HD session.HD normally scheduled MWF -Nephrology following, appreciate recs -HD per nephrology -Sevelamer 2400mg  TID per nephro, 800-1600mg  PRN -Avoid nephrotoxic agents -Daily renal function panel   Electrolyte abnormalities  Hyponatremia of Na 129 this morning, likely secondary to fluid overload. Hyperkalemia with K+ 6.0. - Awaiting f/u CMP and renal function panel  HTN Home meds include amlodipine 10 mg daily, currently held due to soft BPs - Slightly hypertensive overnight (120-124)/(95-97). Normotensive after 0330 with BP (92-114)/(67-84), including during HD.  - Consider metoprolol on non-dialysis days only if needed for BP control - monitor BP   COPD  Stable, currently on 3 L, SpO2 >95%.  - called nurse and asked to wean O2 with SpO2 goal  88-92% -Continue albuterol q6h prn -Wean O2 as tolerated  Normocytic anemia Chronic and stable. Most recent Hb 9.5. Likely anemia of chronic disease secondary to ESRD. -Monitor with CBC  HFmrEF  Mitral valve regurgitation  Chronic and stable. Most recent echo 01/23/2020 notable for EF 50-55% with mild left ventricular hypertrophy with presence of small pericardial effusion.  -cardiology following -Daily weights -Strict  I/Os -1500cc fluid restriction diet  Hyperglycemia Most recent A1c 5.1. CBG today 114. No known episodes of hypoglycemia.  -Monitor CBGs  GERD -Continue protonix 40 mg daily  -Continue famotidine 20 mg bid prn   Tobacco use -nicotine patch 14mg  daily   -Continue to encourage smoking cessation   FEN/GI: NPO, pending TEE/cardioversion PPx: heparin    Status is: Inpatient  Remains inpatient appropriate because:still in A. flutter with RVR   Dispo: The patient is from: Home              Anticipated d/c is to: Home              Anticipated d/c date is: 1 day              Patient currently is not medically stable to d/c.   Subjective:  Mr. Willcutt was found laying in bed watching tv. HD early this morning. No complaints. Pending CMP for K+, possible TEE today. Patient had no questions about the upcoming procedure or plan.   Objective: Temp:  [97.4 F (36.3 C)-98 F (36.7 C)] 97.4 F (36.3 C) (11/16 0813) Pulse Rate:  [118-132] 132 (11/16 0813) Resp:  [19-26] 21 (11/16 0813) BP: (92-124)/(67-97) 104/84 (11/16 0813) SpO2:  [95 %-99 %] 99 % (11/16 0813) Weight:  [71.1 kg] 71.1 kg (11/16 0313)  Physical Exam: General: Chronically ill appearing male in NAD Cardiovascular:  Tachycardic with regular rhythm upon palpation Respiratory: CTAB Abdomen: Soft, non-tender, non-distended Extremities: No BLE edema  Laboratory: Recent Labs  Lab 03/29/20 0228 03/29/20 0228 03/29/20 1806 03/30/20 1206 03/31/20 0320  WBC 6.7  --  7.3  --  7.8  HGB  10.2*   < > 10.1* 11.6* 9.5*  HCT 33.3*   < > 33.6* 34.0* 30.5*  PLT 233  --  208  --  210   < > = values in this interval not displayed.   Recent Labs  Lab 03/25/20 0957 03/26/20 0820 03/28/20 0155 03/28/20 0155 03/28/20 0601 03/29/20 0228 03/29/20 1806 03/29/20 1806 03/30/20 0415 03/30/20 1206 03/31/20 0320  NA 136   < > 133*  133*   < > 134*   < > 129*   < > 128* 125* 129*  K 5.8*   < > 4.1  4.1   < > 4.2   < > 5.3*   < > 5.7* 6.2* 6.0*  CL 91*   < > 94*  94*   < > 91*   < > 89*   < > 90* 94* 88*  CO2 27   < > 25  24   < > 26   < > 21*  --  21*  --  18*  BUN 80*   < > 38*  38*   < > 41*   < > 72*   < > 83* 91* 108*  CREATININE 9.74*   < > 5.41*  5.29*   < > 5.72*   < > 8.39*   < > 9.34* 10.40* 10.75*  CALCIUM 10.2   < > 9.8  9.8   < > 10.1   < > 10.0  --  10.1  --  10.1  PROT 6.9  --  6.1*  --  6.3*  --   --   --   --   --   --   BILITOT 0.8  --  1.0  --  0.7  --   --   --   --   --   --   ALKPHOS 162*  --  166*  --  159*  --   --   --   --   --   --   ALT 26  --  153*  --  154*  --   --   --   --   --   --   AST 21  --  91*  --  87*  --   --   --   --   --   --   GLUCOSE 105*   < > 89  90   < > 99   < > 124*   < > 105* 90 114*   < > = values in this interval not displayed.    Imaging/Diagnostic Tests: No results found.   Ezequiel Essex, MD 03/31/2020, 12:08 PM PGY-1, [facility] Intern pager: [phone], text pages welcome

## 2020-03-31 NOTE — Telephone Encounter (Signed)
Called patient, no answer. Left a message for him to call us back to make an OV in Jan. And that the PV/ECL are cancelled at this time. Mailed letter to patient also.

## 2020-03-31 NOTE — Anesthesia Procedure Notes (Signed)
Procedure Name: MAC Date/Time: 03/31/2020 2:09 PM Performed by: Amadeo Garnet, CRNA Pre-anesthesia Checklist: Patient identified, Emergency Drugs available, Timeout performed, Patient being monitored and Suction available Patient Re-evaluated:Patient Re-evaluated prior to induction Oxygen Delivery Method: Nasal cannula Preoxygenation: Pre-oxygenation with 100% oxygen Induction Type: IV induction Placement Confirmation: positive ETCO2 Dental Injury: Teeth and Oropharynx as per pre-operative assessment

## 2020-03-31 NOTE — Progress Notes (Signed)
PT Cancellation Note  Patient Details Name: Jeremy Mccoy MRN: [id number] DOB: Nov 04, 1965   Cancelled Treatment:    Reason Eval/Treat Not Completed: Medical issues which prohibited therapy. Pt with resting HR 130's and scheduled for cardioversion later today.    Shary Decamp [facility] 03/31/2020, 9:49 AM [place] Pager [phone] Office [phone]

## 2020-03-31 NOTE — Anesthesia Preprocedure Evaluation (Addendum)
Anesthesia Evaluation  Patient identified by MRN, date of birth, ID band Patient awake    Reviewed: Allergy & Precautions, NPO status , Patient's Chart, lab work & pertinent test results  Airway Mallampati: I  TM Distance: >3 FB Neck ROM: Full    Dental  (+) Teeth Intact, Dental Advisory Given   Pulmonary shortness of breath and with exertion, pneumonia, COPD,  COPD inhaler, Current Smoker and Patient abstained from smoking.,  50 pack year history, still smoking 1ppd COVID 3 weeks ago   Pulmonary exam normal breath sounds clear to auscultation       Cardiovascular hypertension, Pt. on medications +CHF (diastolic dysfunction, LVEF 50%)  + dysrhythmias Atrial Fibrillation + Valvular Problems/Murmurs (mild MR, mod MS) MR  Rhythm:Irregular Rate:Tachycardia  Echo 01/23/20: 1. Left ventricular ejection fraction, by estimation, is 50 to 55%. The left ventricle has low normal function. The left ventricle has no regional wall motion abnormalities. There is mild left ventricular hypertrophy. Left ventricular diastolic function could not be evaluated.  2. Right ventricular systolic function is normal. The right ventricular size is normal. There is moderately elevated pulmonary artery systolic pressure.  3. Left atrial size was severely dilated.  4. Right atrial size was mildly dilated.  5. A small pericardial effusion is present.  6. The mitral valve is abnormal. Mild mitral valve regurgitation. Moderate mitral stenosis. Severe mitral annular calcification.  7. The aortic valve is tricuspid. There is mild calcification of the aortic valve. There is mild thickening of the aortic valve. Aortic valve regurgitation is trivial. Mild aortic valve sclerosis is present, with no evidence of aortic valve stenosis.  8. The inferior vena cava is dilated in size with <50% respiratory variability, suggesting right atrial pressure of 15 mmHg.     Neuro/Psych  Headaches, Seizures -,  PSYCHIATRIC DISORDERS Anxiety Depression    GI/Hepatic GERD  Medicated and Controlled,(+)     substance abuse  cocaine use,   Endo/Other  negative endocrine ROS  Renal/GU ESRF and DialysisRenal diseaseLast HD last night, K 5.7 this AM  negative genitourinary   Musculoskeletal  (+) Arthritis , Osteoarthritis,    Abdominal   Peds  Hematology  (+) Blood dyscrasia, anemia , H/H 10.1/33.6   Anesthesia Other Findings started on Eliquis. Initially treated with IV diltiazem, this was transitioned to amiodarone due to soft BP. currently in 2:1 atrial flutter, rate in 120s  Reproductive/Obstetrics negative OB ROS                            Anesthesia Physical  Anesthesia Plan  ASA: IV  Anesthesia Plan: MAC   Post-op Pain Management:    Induction: Intravenous  PONV Risk Score and Plan: 0 and Propofol infusion, TIVA, Treatment may vary due to age or medical condition and Midazolam  Airway Management Planned: Natural Airway, Nasal Cannula and Mask  Additional Equipment: None  Intra-op Plan:   Post-operative Plan:   Informed Consent: I have reviewed the patients History and Physical, chart, labs and discussed the procedure including the risks, benefits and alternatives for the proposed anesthesia with the patient or authorized representative who has indicated his/her understanding and acceptance.     Dental advisory given  Plan Discussed with: CRNA  Anesthesia Plan Comments:        Anesthesia Quick Evaluation

## 2020-03-31 NOTE — Interval H&P Note (Signed)
History and Physical Interval Note:  03/31/2020 1:40 PM  Jeremy Mccoy  has presented today for surgery, with the diagnosis of afib.  The various methods of treatment have been discussed with the patient and family. After consideration of risks, benefits and other options for treatment, the patient has consented to  Procedure(s): TRANSESOPHAGEAL ECHOCARDIOGRAM (TEE) (N/A) CARDIOVERSION (N/A) as a surgical intervention.  The patient's history has been reviewed, patient examined, no change in status, stable for surgery.  I have reviewed the patient's chart and labs.  Questions were answered to the patient's satisfaction.     Kirk Ruths

## 2020-04-01 ENCOUNTER — Other Ambulatory Visit (HOSPITAL_COMMUNITY): Payer: Self-pay | Admitting: Family Medicine

## 2020-04-01 DIAGNOSIS — R0602 Shortness of breath: Secondary | ICD-10-CM | POA: Diagnosis not present

## 2020-04-01 DIAGNOSIS — I4891 Unspecified atrial fibrillation: Secondary | ICD-10-CM | POA: Diagnosis not present

## 2020-04-01 DIAGNOSIS — R079 Chest pain, unspecified: Secondary | ICD-10-CM | POA: Diagnosis not present

## 2020-04-01 DIAGNOSIS — I5032 Chronic diastolic (congestive) heart failure: Secondary | ICD-10-CM

## 2020-04-01 DIAGNOSIS — E875 Hyperkalemia: Secondary | ICD-10-CM | POA: Diagnosis not present

## 2020-04-01 DIAGNOSIS — I4892 Unspecified atrial flutter: Secondary | ICD-10-CM | POA: Diagnosis not present

## 2020-04-01 LAB — RENAL FUNCTION PANEL
Albumin: 2.8 g/dL — ABNORMAL LOW (ref 3.5–5.0)
Anion gap: 19 — ABNORMAL HIGH (ref 5–15)
BUN: 60 mg/dL — ABNORMAL HIGH (ref 6–20)
CO2: 23 mmol/L (ref 22–32)
Calcium: 9.8 mg/dL (ref 8.9–10.3)
Chloride: 91 mmol/L — ABNORMAL LOW (ref 98–111)
Creatinine, Ser: 7.1 mg/dL — ABNORMAL HIGH (ref 0.61–1.24)
GFR, Estimated: 9 mL/min — ABNORMAL LOW (ref >60)
Glucose, Bld: 175 mg/dL — ABNORMAL HIGH (ref 70–99)
Phosphorus: 6 mg/dL — ABNORMAL HIGH (ref 2.5–4.6)
Potassium: 5.3 mmol/L — ABNORMAL HIGH (ref 3.5–5.1)
Sodium: 133 mmol/L — ABNORMAL LOW (ref 135–145)

## 2020-04-01 LAB — CBC
HCT: 31.5 % — ABNORMAL LOW (ref 39.0–52.0)
Hemoglobin: 9.1 g/dL — ABNORMAL LOW (ref 13.0–17.0)
MCH: 27 pg (ref 26.0–34.0)
MCHC: 28.9 g/dL — ABNORMAL LOW (ref 30.0–36.0)
MCV: 93.5 fL (ref 80.0–100.0)
Platelets: 192 10*3/uL (ref 150–400)
RBC: 3.37 MIL/uL — ABNORMAL LOW (ref 4.22–5.81)
RDW: 22.3 % — ABNORMAL HIGH (ref 11.5–15.5)
WBC: 6.9 10*3/uL (ref 4.0–10.5)
nRBC: 0 % (ref 0.0–0.2)

## 2020-04-01 MED ORDER — AMIODARONE HCL 200 MG PO TABS: 200.0000 mg | ORAL_TABLET | Freq: Two times a day (BID) | ORAL | Status: DC

## 2020-04-01 MED ORDER — PANTOPRAZOLE SODIUM 40 MG PO TBEC
40.0000 mg | DELAYED_RELEASE_TABLET | Freq: Every day | ORAL | 0 refills | Status: DC
Start: 2020-04-01 — End: 2020-04-01

## 2020-04-01 MED ORDER — AMIODARONE HCL 200 MG PO TABS
200.0000 mg | ORAL_TABLET | Freq: Every day | ORAL | Status: DC
  Filled 2020-04-01: qty 1

## 2020-04-01 MED ORDER — APIXABAN 5 MG PO TABS
5.0000 mg | ORAL_TABLET | Freq: Two times a day (BID) | ORAL | 0 refills | Status: DC
Start: 2020-04-01 — End: 2020-04-06

## 2020-04-01 MED ORDER — LIDOCAINE HCL (PF) 1 % IJ SOLN: 5.0000 mL | INTRAMUSCULAR | Status: DC | PRN

## 2020-04-01 MED ORDER — HYDROXYZINE HCL 25 MG PO TABS: 25.0000 mg | ORAL_TABLET | Freq: Three times a day (TID) | ORAL | 0 refills | Status: DC | PRN

## 2020-04-01 MED ORDER — SODIUM CHLORIDE 0.9 % IV SOLN: 100.0000 mL | INTRAVENOUS | Status: DC | PRN

## 2020-04-01 MED ORDER — AMIODARONE HCL 200 MG PO TABS
200.0000 mg | ORAL_TABLET | Freq: Two times a day (BID) | ORAL | Status: DC
  Administered 2020-04-01: 200 mg via ORAL
  Filled 2020-04-01: qty 1

## 2020-04-01 MED ORDER — HEPARIN SODIUM (PORCINE) 1000 UNIT/ML DIALYSIS: 1000.0000 [IU] | INTRAMUSCULAR | Status: DC | PRN

## 2020-04-01 MED ORDER — AMIODARONE HCL 200 MG PO TABS
200.0000 mg | ORAL_TABLET | Freq: Two times a day (BID) | ORAL | 0 refills | Status: DC
Start: 2020-04-01 — End: 2020-04-06

## 2020-04-01 MED ORDER — LIDOCAINE-PRILOCAINE 2.5-2.5 % EX CREA: 1.0000 "application " | TOPICAL_CREAM | CUTANEOUS | Status: DC | PRN

## 2020-04-01 MED ORDER — PENTAFLUOROPROP-TETRAFLUOROETH EX AERO: 1.0000 "application " | INHALATION_SPRAY | CUTANEOUS | Status: DC | PRN

## 2020-04-01 MED ORDER — AMIODARONE HCL 200 MG PO TABS
200.0000 mg | ORAL_TABLET | Freq: Every day | ORAL | 0 refills | Status: DC
Start: 2020-04-08 — End: 2020-04-08

## 2020-04-01 MED FILL — AMIODARONE HCL 200 MG TAB: 200 | 30 days supply | Qty: 37 | Fill #0

## 2020-04-01 MED FILL — ELIQUIS 5 MG TABLET: 5 | 30 days supply | Qty: 60 | Fill #0

## 2020-04-01 MED FILL — PANTOPRAZOLE SOD DR 40 MG T: 40 | 30 days supply | Qty: 30 | Fill #0

## 2020-04-01 NOTE — Progress Notes (Signed)
PT Cancellation Note  Patient Details Name: Jeremy Mccoy MRN: [id number] DOB: [birth year]   Cancelled Treatment:    Reason Eval/Treat Not Completed: Patient at procedure or test/unavailable (HD).  Wyona Almas, PT, DPT Acute Rehabilitation Services Pager [phone] Office [phone]    Deno Etienne 04/01/2020, 8:04 AM

## 2020-04-01 NOTE — Progress Notes (Signed)
[facility] Progress Note   Subjective:    Had cardioversion/TEE yesterday. Converted to NSR Comfortable on HD this am. HR 90s. UF goal 4.5L   Objective Vitals:   04/01/20 0745 04/01/20 0752 04/01/20 0800 04/01/20 0830  BP: 126/90 (!) 127/102 126/89 116/76  Pulse:      Resp: (!) 27 18 (!) 32 (!) 22  Temp: 97.8 F (36.6 C)     TempSrc: Oral     SpO2: 92%     Weight: 70.8 kg     Height:       Physical Exam General:chronically ill appearing male in NAD Heart: Regular, S1, S2  Lungs: clear, bilaterally, improved since admission  Abdomen:soft, NTND Extremities:no LE edema Dialysis Access: LU AVF +b/t   Filed Weights   03/31/20 0722 04/01/20 0412 04/01/20 0745  Weight: 68.2 kg 70.1 kg 70.8 kg    Intake/Output Summary (Last 24 hours) at 04/01/2020 0834 Last data filed at 03/31/2020 1700 Gross per 24 hour  Intake 440 ml  Output --  Net 440 ml    Additional Objective Labs: Basic Metabolic Panel: Recent Labs  Lab 03/30/20 0415 03/30/20 1206 03/31/20 0320 03/31/20 1115 04/01/20 0343  NA 128*   < > 129* 134* 133*  K 5.7*   < > 6.0* 4.2 5.3*  CL 90*   < > 88* 92* 91*  CO2 21*  --  18* 26 23  GLUCOSE 105*   < > 114* 92 175*  BUN 83*   < > 108* 38* 60*  CREATININE 9.34*   < > 10.75* 5.57* 7.10*  CALCIUM 10.1  --  10.1 9.3 9.8  PHOS 6.0*  --  6.6*  --  6.0*   < > = values in this interval not displayed.   Liver Function Tests: Recent Labs  Lab 03/25/20 0957 03/26/20 0820 03/28/20 0155 03/28/20 0155 03/28/20 0601 03/29/20 0228 03/30/20 0415 03/31/20 0320 04/01/20 0343  AST 21  --  91*  --  87*  --   --   --   --   ALT 26  --  153*  --  154*  --   --   --   --   ALKPHOS 162*  --  166*  --  159*  --   --   --   --   BILITOT 0.8  --  1.0  --  0.7  --   --   --   --   PROT 6.9  --  6.1*  --  6.3*  --   --   --   --   ALBUMIN 2.8*   < > 2.4*   2.5*   < > 2.6*   < > 2.7* 2.6* 2.8*   < > = values in this interval not displayed.   Recent Labs   Lab 03/25/20 0957  LIPASE 24   CBC: Recent Labs  Lab 03/25/20 0957 03/26/20 0820 03/28/20 0601 03/28/20 0601 03/29/20 0228 03/29/20 0228 03/29/20 1806 03/29/20 1806 03/30/20 1206 03/31/20 0320 04/01/20 0758  WBC 5.4   < > 6.2   < > 6.7   < > 7.3  --   --  7.8 6.9  NEUTROABS 4.0  --   --   --   --   --   --   --   --   --   --   HGB 10.2*   < > 10.0*   < > 10.2*   < > 10.1*   < >  11.6* 9.5* 9.1*  HCT 35.2*   < > 33.6*   < > 33.3*   < > 33.6*   < > 34.0* 30.5* 31.5*  MCV 96.2   < > 93.9  --  93.3  --  91.8  --   --  90.2 93.5  PLT 244   < > 223   < > 233   < > 208  --   --  210 192   < > = values in this interval not displayed.    Medications:  [START ON 04/02/2020] sodium chloride     [START ON 04/02/2020] sodium chloride     amiodarone 30 mg/hr (04/01/20 0114)    apixaban  5 mg Oral BID   Chlorhexidine Gluconate Cloth  6 each Topical Q0600   nicotine  14 mg Transdermal Daily   pantoprazole  40 mg Oral Daily   sevelamer carbonate  2,400 mg Oral TID with meals   sodium chloride flush  3 mL Intravenous Q12H    Dialysis Orders: AF MWF 4h 70min 500/800 66.5kg 1K/2.5Ca  AVF No heparin Hectorol 3 TIW Parsabiv 15 Mircera 225 (last 11/8)  Assessment/Plan: 1. AFib w RVR on admission --A flutter w/RVR ongoing. Lopressor and cardizem stopped due to hypotension.  Eliquis started. Percardiology. s/p TEE/DCCV 11/16  Reduced EF 30-35% on Echo.   2. ESRD - HD MWF. Truncates all treatments as outpatient.UF limited friday due to hypotension. Had  extra HD on 11/13 and 11/14  due to volume. Back on schedule this week. HD today on schedule.  3. Hyperkalemia -chronic issue. On1K bath +weekly Veltassa as outpatient. Loklema ordered here.Admits eating outside food. Continue renal diet.  4. Hypertension/volume - BP ok. Chronic volume overload. Likely pulm edema on CXR on admission. Volume has improved with extra HD. Had extra HD x2 over weekend. Follow weights 5. Anemia  - Hgb 10.2. Recent ESA as outpatient. Follow.H/o GI bleeding -to see GI as outpatient 6. Metabolic bone disease - Ca elevated phos in goal. Continue binders. Will hold hectorol for now due to increasing Ca. Use low Ca bath. Parsabiv not on hospital formulary 7. Nutrition - renal diet w/fluid restrictions  Lynnda Child PA-C [facility] 04/01/2020,8:34 AM

## 2020-04-01 NOTE — Progress Notes (Addendum)
The patient has been seen in conjunction with Harlan Stains, NP. All aspects of care have been considered and discussed. The patient has been personally interviewed, examined, and all clinical data has been reviewed.   Maintaining normal sinus rhythm.  Discontinue IV amiodarone and start oral amiodarone 200 mg twice daily x1 week then 200 mg/day thereafter.  Follow-up with primary cardiologist in 2 to 4 weeks with EKG.  Okay to discharge the patient today from cardiology standpoint.   Progress Note  Patient Name: Jeremy Mccoy Date of Encounter: 04/01/2020  [facility] Cardiologist: Fransico Him, MD   Subjective   Feeling much better this morning. No chest pain, breathing much easier.   Inpatient Medications    Scheduled Meds: . apixaban  5 mg Oral BID  . Chlorhexidine Gluconate Cloth  6 each Topical Q0600  . nicotine  14 mg Transdermal Daily  . pantoprazole  40 mg Oral Daily  . sevelamer carbonate  2,400 mg Oral TID with meals  . sodium chloride flush  3 mL Intravenous Q12H   Continuous Infusions: . [START ON 04/02/2020] sodium chloride    . [START ON 04/02/2020] sodium chloride    . amiodarone 30 mg/hr (04/01/20 0114)   PRN Meds: [START ON 04/02/2020] sodium chloride, [START ON 04/02/2020] sodium chloride, acetaminophen, albuterol, famotidine, [START ON 04/02/2020] heparin, hydrOXYzine, [START ON 04/02/2020] lidocaine (PF), [START ON 04/02/2020] lidocaine-prilocaine, [START ON 04/02/2020] pentafluoroprop-tetrafluoroeth, sevelamer carbonate   Vital Signs    Vitals:   04/01/20 0011 04/01/20 0412 04/01/20 0455 04/01/20 0459  BP: 111/81  103/85   Pulse: (!) 106  99   Resp: 16  (!) 23   Temp: 97.6 F (36.4 C)  (!) 97.3 F (36.3 C)   TempSrc: Oral  Oral   SpO2: 92%  92% 97%  Weight:  70.1 kg    Height:        Intake/Output Summary (Last 24 hours) at 04/01/2020 0739 Last data filed at 03/31/2020 1700 Gross per 24 hour  Intake 440 ml  Output --  Net  440 ml   Last 3 Weights 04/01/2020 03/31/2020 03/31/2020  Weight (lbs) 154 lb 9.6 oz 150 lb 5.7 oz 156 lb 12 oz  Weight (kg) 70.126 kg 68.2 kg 71.1 kg  Some encounter information is confidential and restricted. Go to Review Flowsheets activity to see all data.      Telemetry    SR rates 90s - Personally Reviewed  ECG    No new tracing  Physical Exam  Pleasant male, sitting up in bed for HD GEN: No acute distress.   Neck: No JVD Cardiac: RRR, no murmurs, rubs, or gallops.  Respiratory: Clear to auscultation bilaterally. GI: Soft, nontender, non-distended  MS: No edema; No deformity. Neuro:  Nonfocal  Psych: Normal affect   Labs    High Sensitivity Troponin:   Recent Labs  Lab 03/25/20 0957 03/25/20 1505  TROPONINIHS 64* 58*      Chemistry Recent Labs  Lab 03/25/20 0957 03/26/20 0820 03/28/20 0155 03/28/20 0155 03/28/20 0601 03/29/20 0228 03/30/20 0415 03/30/20 1206 03/31/20 0320 03/31/20 1115 04/01/20 0343  NA 136   < > 133*  133*   < > 134*   < > 128*   < > 129* 134* 133*  K 5.8*   < > 4.1  4.1   < > 4.2   < > 5.7*   < > 6.0* 4.2 5.3*  CL 91*   < > 94*  94*   < >  91*   < > 90*   < > 88* 92* 91*  CO2 27   < > 25  24   < > 26   < > 21*  --  18* 26 23  GLUCOSE 105*   < > 89  90   < > 99   < > 105*   < > 114* 92 175*  BUN 80*   < > 38*  38*   < > 41*   < > 83*   < > 108* 38* 60*  CREATININE 9.74*   < > 5.41*  5.29*   < > 5.72*   < > 9.34*   < > 10.75* 5.57* 7.10*  CALCIUM 10.2   < > 9.8  9.8   < > 10.1   < > 10.1  --  10.1 9.3 9.8  PROT 6.9  --  6.1*  --  6.3*  --   --   --   --   --   --   ALBUMIN 2.8*   < > 2.4*  2.5*   < > 2.6*   < > 2.7*  --  2.6*  --  2.8*  AST 21  --  91*  --  87*  --   --   --   --   --   --   ALT 26  --  153*  --  154*  --   --   --   --   --   --   ALKPHOS 162*  --  166*  --  159*  --   --   --   --   --   --   BILITOT 0.8  --  1.0  --  0.7  --   --   --   --   --   --   GFRNONAA 6*   < > 12*  12*   < > 11*   < > 6*  --   5* 11* 9*  ANIONGAP 18*   < > 14  15   < > 17*   < > 17*  --  23* 16* 19*   < > = values in this interval not displayed.     Hematology Recent Labs  Lab 03/29/20 0228 03/29/20 0228 03/29/20 1806 03/30/20 1206 03/31/20 0320  WBC 6.7  --  7.3  --  7.8  RBC 3.57*  --  3.66*  --  3.38*  HGB 10.2*   < > 10.1* 11.6* 9.5*  HCT 33.3*   < > 33.6* 34.0* 30.5*  MCV 93.3  --  91.8  --  90.2  MCH 28.6  --  27.6  --  28.1  MCHC 30.6  --  30.1  --  31.1  RDW 22.2*  --  22.5*  --  22.4*  PLT 233  --  208  --  210   < > = values in this interval not displayed.    BNPNo results for input(s): BNP, PROBNP in the last 168 hours.   DDimer No results for input(s): DDIMER in the last 168 hours.   Radiology    ECHO TEE  Result Date: 03/31/2020    TRANSESOPHOGEAL ECHO REPORT   Patient Name:   Jeremy Mccoy [facility] Date of Exam: 03/31/2020 Medical Rec #:  [id number]        Height:       72.0 in Accession #:    [id number]  Weight:       156.7 lb Date of Birth:  06-Jul-1965        BSA:          1.921 m Patient Age:    53 years         BP:           130/80 mmHg Patient Gender: M                HR:           130 bpm. Exam Location:  Inpatient Procedure: Transesophageal Echo Indications:    Atrial fibrillation and Flutter  History:        Patient has prior history of Echocardiogram examinations.                 Arrythmias:Atrial Flutter.  Sonographer:    Mikki Santee RDCS (AE) Referring Phys: [phone] HAO MENG PROCEDURE: The transesophogeal probe was passed without difficulty through the esophogus of the patient. Sedation performed by different physician. The patient developed no complications during the procedure. IMPRESSIONS  1. Left ventricular ejection fraction, by estimation, is 30 to 35%. The left ventricle has moderately decreased function. The left ventricle demonstrates global hypokinesis.  2. Right ventricular systolic function is moderately reduced. The right ventricular size is moderately enlarged.   3. Left atrial size was severely dilated. No left atrial/left atrial appendage thrombus was detected.  4. Right atrial size was moderately dilated.  5. A small pericardial effusion is present.  6. The mitral valve is abnormal. Moderate mitral valve regurgitation. Mild mitral stenosis. Moderate mitral annular calcification.  7. Tricuspid valve regurgitation is moderate.  8. The aortic valve is tricuspid. Aortic valve regurgitation is mild.  9. There is mild (Grade II) plaque involving the descending aorta. FINDINGS  Left Ventricle: Left ventricular ejection fraction, by estimation, is 30 to 35%. The left ventricle has moderately decreased function. The left ventricle demonstrates global hypokinesis. The left ventricular internal cavity size was normal in size. Right Ventricle: The right ventricular size is moderately enlarged. Right vetricular wall thickness was not assessed. Right ventricular systolic function is moderately reduced. Left Atrium: Left atrial size was severely dilated. No left atrial/left atrial appendage thrombus was detected. Right Atrium: Right atrial size was moderately dilated. Pericardium: A small pericardial effusion is present. Mitral Valve: The mitral valve is abnormal. There is mild thickening of the mitral valve leaflet(s). Moderate mitral annular calcification. Moderate mitral valve regurgitation. Mild mitral valve stenosis. MV peak gradient, 10.2 mmHg. The mean mitral valve gradient is 5.5 mmHg. Tricuspid Valve: The tricuspid valve is normal in structure. Tricuspid valve regurgitation is moderate. Aortic Valve: The aortic valve is tricuspid. Aortic valve regurgitation is mild. Pulmonic Valve: The pulmonic valve was normal in structure. Pulmonic valve regurgitation is trivial. Aorta: The aortic root is normal in size and structure. There is mild (Grade II) plaque involving the descending aorta. IAS/Shunts: No atrial level shunt detected by color flow Doppler.  MITRAL VALVE                  TRICUSPID VALVE MV Peak grad: 10.2 mmHg      TR Peak grad:   40.2 mmHg MV Mean grad: 5.5 mmHg       TR Vmax:        317.00 cm/s MV Vmax:      1.60 m/s MV Vmean:     108.0 cm/s MR Peak grad:    95.6 mmHg MR Mean grad:  65.0 mmHg MR Vmax:         489.00 cm/s MR Vmean:        379.0 cm/s MR PISA:         2.26 cm MR PISA Eff ROA: 18 mm MR PISA Radius:  0.60 cm Kirk Ruths MD Electronically signed by Kirk Ruths MD Signature Date/Time: 03/31/2020/4:00:12 PM    Final     Cardiac Studies   TEE: 03/31/20  IMPRESSIONS    1. Left ventricular ejection fraction, by estimation, is 30 to 35%. The  left ventricle has moderately decreased function. The left ventricle  demonstrates global hypokinesis.  2. Right ventricular systolic function is moderately reduced. The right  ventricular size is moderately enlarged.  3. Left atrial size was severely dilated. No left atrial/left atrial  appendage thrombus was detected.  4. Right atrial size was moderately dilated.  5. A small pericardial effusion is present.  6. The mitral valve is abnormal. Moderate mitral valve regurgitation.  Mild mitral stenosis. Moderate mitral annular calcification.  7. Tricuspid valve regurgitation is moderate.  8. The aortic valve is tricuspid. Aortic valve regurgitation is mild.  9. There is mild (Grade II) plaque involving the descending aorta.   FINDINGS  Left Ventricle: Left ventricular ejection fraction, by estimation, is 30  to 35%. The left ventricle has moderately decreased function. The left  ventricle demonstrates global hypokinesis. The left ventricular internal  cavity size was normal in size.   Right Ventricle: The right ventricular size is moderately enlarged. Right  vetricular wall thickness was not assessed. Right ventricular systolic  function is moderately reduced.   Left Atrium: Left atrial size was severely dilated. No left atrial/left  atrial appendage thrombus was detected.   Right  Atrium: Right atrial size was moderately dilated.   Pericardium: A small pericardial effusion is present.   Mitral Valve: The mitral valve is abnormal. There is mild thickening of  the mitral valve leaflet(s). Moderate mitral annular calcification.  Moderate mitral valve regurgitation. Mild mitral valve stenosis. MV peak  gradient, 10.2 mmHg. The mean mitral  valve gradient is 5.5 mmHg.   Tricuspid Valve: The tricuspid valve is normal in structure. Tricuspid  valve regurgitation is moderate.   Aortic Valve: The aortic valve is tricuspid. Aortic valve regurgitation is  mild.   Pulmonic Valve: The pulmonic valve was normal in structure. Pulmonic valve  regurgitation is trivial.   Aorta: The aortic root is normal in size and structure. There is mild  (Grade II) plaque involving the descending aorta.   IAS/Shunts: No atrial level shunt detected by color flow Doppler.     MITRAL VALVE         TRICUSPID VALVE  MV Peak grad: 10.2 mmHg   TR Peak grad:  40.2 mmHg  MV Mean grad: 5.5 mmHg    TR Vmax:    317.00 cm/s  MV Vmax:   1.60 m/s  MV Vmean:   108.0 cm/s  MR Peak grad:  95.6 mmHg  MR Mean grad:  65.0 mmHg  MR Vmax:     489.00 cm/s  MR Vmean:    379.0 cm/s  MR PISA:     2.26 cm  MR PISA Eff ROA: 18 mm  MR PISA Radius: 0.60 cm   Patient Profile     54 y.o. male with a hx of COPD with ongoing tobacco use, hx of cocaine use, HTN, ESRD on HD (MWF) and recentCOVID/PNAinfection seen for afib/aflutter RVR.    Assessment & Plan  1. 2:1 Aflutter: underwent successful cardioversion yesterday. Maintaining SR. Still on IV amiodarone. Will plan to convert to 400mg  BID once completed HD this morning. Tolerating Eliquis 5mg  BID. Will dilated atria, suspect he may end up needing long term AAD to maintain SR.   2. Atypical chest pain: No significant chest pain this morning.   3. ESRD on HD MWF: getting HD this morning. Nephrology  following.  4. Hyperkalemia: K+ 5.3 today.   5. Tobacco/cocaine use: cessation advised  6 . Mitral valve disease: mild stenosis with moderate calcification on TEE yesterday.   7. Acute systolic HF: EF noted at 11-94% with global hypokinesis. Suspect this is tachy-mediated given Afib RVR on admission. Blood pressures are variable, mostly soft at times. Ideally would like to add low dose Toprol if able to tolerate with HD. -- plan for repeat echo in 3 months  For questions or updates, please contact Lenora Please consult [email or web address] for contact info under        Signed, Reino Bellis, NP  04/01/2020, 7:39 AM

## 2020-04-01 NOTE — Progress Notes (Signed)
OT Cancellation Note  Patient Details Name: BEVERLY SURIANO MRN: [id number] DOB: Aug 02, 1965   Cancelled Treatment:    Reason Eval/Treat Not Completed: Patient at procedure or test/ unavailable (Pt at HD. OT to follow for OT evaluation.)   Jefferey Pica, OTR/L Acute Rehabilitation Services Pager: [phone] Office: [phone]   Grisell Bissette C 04/01/2020, 8:41 AM

## 2020-04-01 NOTE — Discharge Summary (Addendum)
[facility] Discharge Summary  Patient name: BAO BAZEN Medical record number: [id number] Date of birth: [birth year] Age: 54 y.o. Gender: male Date of Admission: 03/25/2020  Date of Discharge: 04/01/2020 Admitting Physician: Kinnie Feil, MD  Primary Care Provider: Patient, No Pcp Per Consultants: Cardiology, Nephrology  Indication for Hospitalization: A. Fib with RVR, abdominal pain, dyspnea  Discharge Diagnoses/Problem List:  A-Flutter with RVR, resolved ESRD HTN COPD Normocytic anemia HFmrEF GERD Tobacco Use  Disposition: Home  Discharge Condition: Stable  Discharge Exam:   Physical Exam General: Awake, alert, oriented Cardiovascular: Regular rate and rhythm, S1 and S2 present, no murmurs auscultated Respiratory: Lung fields clear to auscultation bilaterally Extremities: No bilateral lower extremity edema, palpable pedal and pretibial pulses bilaterally Neuro: Cranial nerves II through X grossly intact, able to move all extremities spontaneously    Brief Hospital Course:  ZAK GONDEK is a 54 y.o. male who presented with SOB and dizziness. PMH is significant for ESRD on HD MWF, a-fib with RVR, HTN, COPD, CHF, tobacco use, prior cocaine abuse, recent COVID, and medication noncompliance.  A-Flutter with RVR Patient presented in 2:1 a-flutter with RVR and was placed on a diltiazem drip. His HR remained in the 130s despite Diltiazem drip and metoprolol. Cardiology followed the patient throughout his admission. Patient was started on Eliquis 5mg  BID for anticoagulation. He was switched to an amiodarone drip on 11/12, but was still persistently in a-flutter with HR 130s.  Patient was scheduled for multiple TEE DCCV but this had to be rescheduled due to hyperkalemia.  Ultimately, on 11/17 patient underwent successful TEE DCCV 11/16 and maintained NSR through discharge. Cardiology recommends continuing anticoagulation with Eliquis after  discharge.  Patient was also transitioned from diltiazem to amiodarone and discharged on amiodarone 200 mg twice daily for 1 week and then transition to 200 mg daily.   ESRD on HD M/W/F  Hyperkalemia Patient was followed by nephrology throughout his admission. He received HD daily from 11/11-11/17.  He reportedly truncates all HD sessions as an outpatient.   All other issues chronic and stable.      Issues for Follow Up:  1. Follow up with PCP for A1c for blood glucose monitoring. 2. Follow up with cardiology outpatient in 2-4 weeks. Will need EKG at this time.  3. Follow up with PCP for repeat CBC to monitor Hgb. History of GI bleed in Oct 2021. Consider endoscopy and colonoscopy, which patient refused previously per chart review.  Significant Procedures: TEE with successful cardioversion 03/31/2020  Significant Labs and Imaging:  Recent Labs  Lab 03/29/20 1806 03/29/20 1806 03/30/20 1206 03/31/20 0320 04/01/20 0758  WBC 7.3  --   --  7.8 6.9  HGB 10.1*   < > 11.6* 9.5* 9.1*  HCT 33.6*   < > 34.0* 30.5* 31.5*  PLT 208  --   --  210 192   < > = values in this interval not displayed.   Recent Labs  Lab 03/28/20 0155 03/28/20 0155 03/28/20 0601 03/28/20 0601 03/29/20 0228 03/29/20 0228 03/29/20 1806 03/29/20 1806 03/30/20 0415 03/30/20 0415 03/30/20 1206 03/30/20 1206 03/31/20 0320 03/31/20 0320 03/31/20 1115 04/01/20 0343  NA 133*  133*   < > 134*   < > 131*   < > 129*   < > 128*  --  125*  --  129*  --  134* 133*  K 4.1  4.1   < > 4.2   < > 4.8   < >  5.3*   < > 5.7*   < > 6.2*   < > 6.0*   < > 4.2 5.3*  CL 94*  94*   < > 91*   < > 91*   < > 89*   < > 90*  --  94*  --  88*  --  92* 91*  CO2 25  24   < > 26   < > 23   < > 21*  --  21*  --   --   --  18*  --  26 23  GLUCOSE 89  90   < > 99   < > 114*   < > 124*   < > 105*  --  90  --  114*  --  92 175*  BUN 38*  38*   < > 41*   < > 58*   < > 72*   < > 83*  --  91*  --  108*  --  38* 60*  CREATININE 5.41*   5.29*   < > 5.72*   < > 7.07*   < > 8.39*   < > 9.34*  --  10.40*  --  10.75*  --  5.57* 7.10*  CALCIUM 9.8  9.8   < > 10.1   < > 9.9   < > 10.0  --  10.1  --   --   --  10.1  --  9.3 9.8  PHOS 4.4  --   --    < > 5.4*  --  5.6*  --  6.0*  --   --   --  6.6*  --   --  6.0*  ALKPHOS 166*  --  159*  --   --   --   --   --   --   --   --   --   --   --   --   --   AST 91*  --  87*  --   --   --   --   --   --   --   --   --   --   --   --   --   ALT 153*  --  154*  --   --   --   --   --   --   --   --   --   --   --   --   --   ALBUMIN 2.4*  2.5*   < > 2.6*   < > 2.5*  --  2.6*  --  2.7*  --   --   --  2.6*  --   --  2.8*   < > = values in this interval not displayed.    Results/Tests Pending at Time of Discharge: None  Discharge Medications:  Allergies as of 04/01/2020   No Known Allergies     Medication List    STOP taking these medications   amLODipine 10 MG tablet Commonly known as: NORVASC   cloNIDine 0.3 MG tablet Commonly known as: CATAPRES   diltiazem 240 MG 24 hr capsule Commonly known as: Cartia XT   hydrALAZINE 25 MG tablet Commonly known as: APRESOLINE     TAKE these medications   acetaminophen 500 MG tablet Commonly known as: TYLENOL Take 1 tablet (500 mg total) by mouth every 8 (eight) hours as needed for mild pain or fever. 6 times a day,  500 mg, 12 to 24 tabs a day, 6,000 to 12,000 mg daily for pain What changed:   how much to take  additional instructions Notes to patient: For mild pain or fever. Do not take more than 3000mg  per day.   albuterol 108 (90 Base) MCG/ACT inhaler Commonly known as: VENTOLIN HFA Inhale 3-6 puffs into the lungs every 6 (six) hours as needed for wheezing or shortness of breath. Notes to patient: For shortness of breath   amiodarone 200 MG tablet Commonly known as: PACERONE Take 1 tablet (200 mg total) by mouth 2 (two) times daily. Notes to patient: **NEW** To control heart rhythm.  From 11/17 to 11/23,  take 200mg  by  mouth TWICE daily Starting 11/24 and on,  take 200mg  by mouth ONCE daily    amiodarone 200 MG tablet Commonly known as: PACERONE Take 1 tablet (200 mg total) by mouth daily. Start taking on: April 08, 2020   apixaban 5 MG Tabs tablet Commonly known as: ELIQUIS Take 1 tablet (5 mg total) by mouth 2 (two) times daily. Notes to patient: **NEW** To prevent clot   aspirin 81 MG chewable tablet Chew 1 tablet (81 mg total) by mouth daily. Notes to patient: For heart health   bismuth subsalicylate 683 MH/96QI suspension Commonly known as: PEPTO BISMOL Take 30 mLs by mouth every 6 (six) hours as needed for indigestion or diarrhea or loose stools. Notes to patient: For indigestion   Dialyvite/Zinc Tabs Take 1 tablet by mouth daily. Notes to patient: Vitamin Supplement   diphenhydrAMINE 25 mg capsule Commonly known as: BENADRYL Take 25 mg by mouth every 6 (six) hours as needed for itching. Notes to patient: For itching. May cause drowsiness and dizziness.    feeding supplement (NEPRO CARB STEADY) Liqd Take 237 mLs by mouth 3 (three) times daily as needed (Supplement). What changed: when to take this Notes to patient: Nutrition supplement   hydrOXYzine 25 MG tablet Commonly known as: ATARAX/VISTARIL Take 1 tablet (25 mg total) by mouth 3 (three) times daily as needed for itching.   pantoprazole 40 MG tablet Commonly known as: PROTONIX Take 1 tablet (40 mg total) by mouth daily.   sevelamer carbonate 800 MG tablet Commonly known as: RENVELA Take 2 tablets (1,600 mg total) by mouth 3 (three) times daily with meals. What changed:   how much to take  when to take this  additional instructions Notes to patient: To lower phosphate level   Zantac 360 Max St 20 MG tablet Generic drug: famotidine Take 20 mg by mouth 2 (two) times daily as needed for heartburn or indigestion. Notes to patient: To lower stomach acid as needed       Discharge Instructions: Please refer to  Patient Instructions section of EMR for full details.  Patient was counseled important signs and symptoms that should prompt return to medical care, changes in medications, dietary instructions, activity restrictions, and follow up appointments.   Follow-Up Appointments:  Follow-up Information    [place], Crista Luria, [place] Follow up on 04/22/2020.   Specialty: Cardiology Why: at 10:15am for your follow up cardiology appt. Contact information: [place] [address] [phone]        [facility] Go on 04/03/2020.   Why: You have a hospital follow up appointment on Friday 11/19 at 3:30pm. Please arrive 15 minutes prior to your appointment time. This appointment will be with Dr. Vinnie Langton of the [facility].  Contact information: [place]              Gifford Shave, MD 04/01/2020, 4:06 PM PGY-1, [place]

## 2020-04-02 ENCOUNTER — Encounter (HOSPITAL_COMMUNITY): Payer: Self-pay | Admitting: Cardiology

## 2020-04-02 DIAGNOSIS — N2581 Secondary hyperparathyroidism of renal origin: Secondary | ICD-10-CM | POA: Diagnosis not present

## 2020-04-02 DIAGNOSIS — N186 End stage renal disease: Secondary | ICD-10-CM | POA: Diagnosis not present

## 2020-04-02 DIAGNOSIS — Z992 Dependence on renal dialysis: Secondary | ICD-10-CM | POA: Diagnosis not present

## 2020-04-02 NOTE — Anesthesia Postprocedure Evaluation (Signed)
Anesthesia Post Note  Patient: Jeremy Mccoy  Procedure(s) Performed: TRANSESOPHAGEAL ECHOCARDIOGRAM (TEE) (N/A ) CARDIOVERSION (N/A )     Patient location during evaluation: PACU Anesthesia Type: MAC Level of consciousness: awake and alert Pain management: pain level controlled Vital Signs Assessment: post-procedure vital signs reviewed and stable Respiratory status: spontaneous breathing Cardiovascular status: stable Anesthetic complications: no   No complications documented.  Last Vitals:  Vitals:   04/01/20 1152 04/01/20 1243  BP: 107/84 111/79  Pulse:  92  Resp: (!) 24 19  Temp: 37.3 C 36.7 C  SpO2: 93% 92%    Last Pain:  Vitals:   04/01/20 1243  TempSrc: Oral  PainSc:                  [name]

## 2020-04-03 ENCOUNTER — Ambulatory Visit: Payer: Medicare HMO | Admitting: Family Medicine

## 2020-04-03 NOTE — Progress Notes (Deleted)
    SUBJECTIVE:   CHIEF COMPLAINT / HPI:   hosp f/u  PERTINENT  PMH / PSH: ***  OBJECTIVE:   There were no vitals taken for this visit.  ***  ASSESSMENT/PLAN:   No problem-specific Assessment & Plan notes found for this encounter.     Benay Pike, MD [place]

## 2020-04-05 DIAGNOSIS — N186 End stage renal disease: Secondary | ICD-10-CM | POA: Diagnosis not present

## 2020-04-05 DIAGNOSIS — Z992 Dependence on renal dialysis: Secondary | ICD-10-CM | POA: Diagnosis not present

## 2020-04-05 DIAGNOSIS — N2581 Secondary hyperparathyroidism of renal origin: Secondary | ICD-10-CM | POA: Diagnosis not present

## 2020-04-06 ENCOUNTER — Encounter: Payer: Self-pay | Admitting: Family Medicine

## 2020-04-06 ENCOUNTER — Ambulatory Visit (INDEPENDENT_AMBULATORY_CARE_PROVIDER_SITE_OTHER): Payer: Medicare HMO | Admitting: Family Medicine

## 2020-04-06 ENCOUNTER — Other Ambulatory Visit: Payer: Self-pay

## 2020-04-06 DIAGNOSIS — D649 Anemia, unspecified: Secondary | ICD-10-CM

## 2020-04-06 DIAGNOSIS — D631 Anemia in chronic kidney disease: Secondary | ICD-10-CM

## 2020-04-06 DIAGNOSIS — I4811 Longstanding persistent atrial fibrillation: Secondary | ICD-10-CM | POA: Diagnosis not present

## 2020-04-06 DIAGNOSIS — N186 End stage renal disease: Secondary | ICD-10-CM

## 2020-04-06 DIAGNOSIS — I480 Paroxysmal atrial fibrillation: Secondary | ICD-10-CM

## 2020-04-06 DIAGNOSIS — Z992 Dependence on renal dialysis: Secondary | ICD-10-CM

## 2020-04-06 MED ORDER — APIXABAN 2.5 MG PO TABS: 2.5000 mg | ORAL_TABLET | Freq: Two times a day (BID) | ORAL | 12 refills | Status: DC

## 2020-04-06 NOTE — Patient Instructions (Signed)
As you already know, do not take aspirin and only take one amiodorone per day. I will call with the blood test results. Only take 1/2 tab eliquis twice a day.  I sent in a new prescription for the 2.5 mg dose.   Keep the appointment with the cardiologist.   Let us know if the rash or bleed gets worse.

## 2020-04-07 ENCOUNTER — Encounter: Payer: Self-pay | Admitting: Family Medicine

## 2020-04-07 DIAGNOSIS — Z992 Dependence on renal dialysis: Secondary | ICD-10-CM | POA: Diagnosis not present

## 2020-04-07 DIAGNOSIS — N2581 Secondary hyperparathyroidism of renal origin: Secondary | ICD-10-CM | POA: Diagnosis not present

## 2020-04-07 DIAGNOSIS — N186 End stage renal disease: Secondary | ICD-10-CM | POA: Diagnosis not present

## 2020-04-07 LAB — CBC
Hematocrit: 33 % — ABNORMAL LOW (ref 37.5–51.0)
Hemoglobin: 10.2 g/dL — ABNORMAL LOW (ref 13.0–17.7)
MCH: 27.3 pg (ref 26.6–33.0)
MCHC: 30.9 g/dL — ABNORMAL LOW (ref 31.5–35.7)
MCV: 88 fL (ref 79–97)
Platelets: 251 10*3/uL (ref 150–450)
RBC: 3.74 x10E6/uL — ABNORMAL LOW (ref 4.14–5.80)
RDW: 19.5 % — ABNORMAL HIGH (ref 11.6–15.4)
WBC: 5.3 10*3/uL (ref 3.4–10.8)

## 2020-04-07 NOTE — Assessment & Plan Note (Addendum)
Recheck hgb on eliquis.  Recheck is good. Stop aspirin.

## 2020-04-07 NOTE — Assessment & Plan Note (Signed)
He has changed from paroxysmal to persistent a fib.  Made sure he was aware that it is time to decrease amiodorone dose to daily.

## 2020-04-07 NOTE — Progress Notes (Signed)
    SUBJECTIVE:   CHIEF COMPLAINT / HPI:   FU hospitalization for Afib with RVR. Issues: A fib is persistent.  Attempted cardioversion failed.  DCed on amiodorone and eliquis. Medication issues ASA was still on his DC meds.  He had not been taking on eliquis.  He does not need to be on both.  DCed asa from his med list. Eliquis dose:  He is ESRD on dialysis.  C/O easy bruising.   Amiodorone: Has been on BID.  Reminded him that it is time to switch to daily.  No bleeding on eliquis but has had previous GI bleed.  He checks and no back stools.  C/O rash right ankle since starting apixaban.  PERTINENT  PMH / PSH: Denies CP or SOB.  He is aware that HR is borderline rapid.  OBJECTIVE:   BP 112/62   Pulse 94   Wt 156 lb 6.4 oz (70.9 kg)   SpO2 92%   BMI 21.21 kg/m   Lungs clear  Cardiac irreg/irreg.   Bilateral 1+ pitting edema of ankles.  (This is typical for him with dialysis tomorrow.)  Right ankle shows some petechiae and hemosiderin deposits.  ASSESSMENT/PLAN:   Anemia in chronic kidney disease Recheck hgb on eliquis.  Recheck is good. Stop aspirin.  ESRD on hemodialysis (HCC) Will drop eliquis dose to 2.5 mg bid.  Atrial fibrillation ([place]) He has changed from paroxysmal to persistent a fib.  Made sure he was aware that it is time to decrease amiodorone dose to daily.     Zenia Resides, MD [place]

## 2020-04-07 NOTE — Assessment & Plan Note (Signed)
Will drop eliquis dose to 2.5 mg bid.

## 2020-04-10 DIAGNOSIS — Z992 Dependence on renal dialysis: Secondary | ICD-10-CM | POA: Diagnosis not present

## 2020-04-10 DIAGNOSIS — N2581 Secondary hyperparathyroidism of renal origin: Secondary | ICD-10-CM | POA: Diagnosis not present

## 2020-04-10 DIAGNOSIS — N186 End stage renal disease: Secondary | ICD-10-CM | POA: Diagnosis not present

## 2020-04-13 DIAGNOSIS — N2581 Secondary hyperparathyroidism of renal origin: Secondary | ICD-10-CM | POA: Diagnosis not present

## 2020-04-13 DIAGNOSIS — N186 End stage renal disease: Secondary | ICD-10-CM | POA: Diagnosis not present

## 2020-04-13 DIAGNOSIS — Z992 Dependence on renal dialysis: Secondary | ICD-10-CM | POA: Diagnosis not present

## 2020-04-14 DIAGNOSIS — N186 End stage renal disease: Secondary | ICD-10-CM | POA: Diagnosis not present

## 2020-04-14 DIAGNOSIS — Z992 Dependence on renal dialysis: Secondary | ICD-10-CM | POA: Diagnosis not present

## 2020-04-14 DIAGNOSIS — I10 Essential (primary) hypertension: Secondary | ICD-10-CM | POA: Diagnosis not present

## 2020-04-15 DIAGNOSIS — Z992 Dependence on renal dialysis: Secondary | ICD-10-CM | POA: Diagnosis not present

## 2020-04-15 DIAGNOSIS — N2581 Secondary hyperparathyroidism of renal origin: Secondary | ICD-10-CM | POA: Diagnosis not present

## 2020-04-15 DIAGNOSIS — N186 End stage renal disease: Secondary | ICD-10-CM | POA: Diagnosis not present

## 2020-04-17 DIAGNOSIS — Z992 Dependence on renal dialysis: Secondary | ICD-10-CM | POA: Diagnosis not present

## 2020-04-17 DIAGNOSIS — N2581 Secondary hyperparathyroidism of renal origin: Secondary | ICD-10-CM | POA: Diagnosis not present

## 2020-04-17 DIAGNOSIS — N186 End stage renal disease: Secondary | ICD-10-CM | POA: Diagnosis not present

## 2020-04-20 DIAGNOSIS — Z992 Dependence on renal dialysis: Secondary | ICD-10-CM | POA: Diagnosis not present

## 2020-04-20 DIAGNOSIS — N186 End stage renal disease: Secondary | ICD-10-CM | POA: Diagnosis not present

## 2020-04-20 DIAGNOSIS — N2581 Secondary hyperparathyroidism of renal origin: Secondary | ICD-10-CM | POA: Diagnosis not present

## 2020-04-22 ENCOUNTER — Ambulatory Visit: Payer: Medicare HMO | Admitting: Physician Assistant

## 2020-04-22 DIAGNOSIS — N2581 Secondary hyperparathyroidism of renal origin: Secondary | ICD-10-CM | POA: Diagnosis not present

## 2020-04-22 DIAGNOSIS — N186 End stage renal disease: Secondary | ICD-10-CM | POA: Diagnosis not present

## 2020-04-22 DIAGNOSIS — Z992 Dependence on renal dialysis: Secondary | ICD-10-CM | POA: Diagnosis not present

## 2020-04-22 NOTE — Progress Notes (Unsigned)
Cardiology Office Note:    Date:  04/22/2020   ID:  Jeremy Mccoy, DOB 08/12/1965, MRN [id number]  PCP:  Patient, No Pcp Per  [facility] Cardiologist:  Shelva Majestic, MD  [facility] Electrophysiologist:  None   Chief Complaint: hospital follow up   History of Present Illness:    Jeremy Mccoy is a 54 y.o. male with a hx of with a hx of COPD with ongoing tobacco use, hx of cocaine use, HTN, ESRD on HD (MWF) and recent COVID/PNA infection presents for hospital follow up.   Admitted 03/2020 for atrial fibrillation/flutter with rapid ventricular rate.  He was placed on IV diltiazem and metoprolol without significant improvement.  Started on Eliquis for anticoagulation.  Due to persistent tachycardia he was placed on amiodarone drip and eventually underwent TEE cardioversion with maintenance of sinus rhythm. EF noted to be 30-35% and felt tachy mediated and recommended to repeat echo in 3 months. recommended to add Toprol if tolerate with HD.   Past Medical History:  Diagnosis Date  . Anemia   . Anxiety   . Arthritis    "qwhere" (05/15/2013)  . CHF (congestive heart failure) ([place])   . COPD (chronic obstructive pulmonary disease) ([place])   . Crack cocaine use   . Dental caries   . Depression   . ESRD (end stage renal disease) on dialysis [facility])    TTS: [address] [place] (05/15/2013)  . Headache(784.0)    "get it from going to dialysis; when they get real bad I come to hospital" (05/15/2013)  . Hematemesis/vomiting blood   . History of blood transfusion 10/2011; 04/2013  . HTN (hypertension) 06/12/2012  . Seizures ([place])   . Shortness of breath    "just when I have too much potassium is too high" (05/15/2013)    Past Surgical History:  Procedure Laterality Date  . AV FISTULA PLACEMENT  11/29/2011   Procedure: ARTERIOVENOUS (AV) FISTULA CREATION;  Surgeon: Angelia Mould, MD;  Location: [place];  Service: Vascular;  Laterality: Left;  . CARDIOVERSION N/A 03/31/2020    Procedure: CARDIOVERSION;  Surgeon: Lelon Perla, MD;  Location: [facility] ENDOSCOPY;  Service: Cardiovascular;  Laterality: N/A;  . ESOPHAGOGASTRODUODENOSCOPY N/A 05/17/2013   Procedure: ESOPHAGOGASTRODUODENOSCOPY (EGD);  Surgeon: Beryle Beams, MD;  Location: [facility] ENDOSCOPY;  Service: Endoscopy;  Laterality: N/A;  . INGUINAL HERNIA REPAIR Bilateral 1967  . IR FLUORO GUIDE CV LINE RIGHT  10/20/2017  . IR THROMBECTOMY AV FISTULA W/THROMBOLYSIS/PTA INC/SHUNT/IMG LEFT Left 10/24/2017  . IR US GUIDE VASC ACCESS LEFT  10/24/2017  . IR US GUIDE VASC ACCESS RIGHT  10/20/2017  . RENAL BIOPSY  11/07/2011  . TEE WITHOUT CARDIOVERSION N/A 03/31/2020   Procedure: TRANSESOPHAGEAL ECHOCARDIOGRAM (TEE);  Surgeon: Lelon Perla, MD;  Location: [facility] ENDOSCOPY;  Service: Cardiovascular;  Laterality: N/A;    Current Medications: No outpatient medications have been marked as taking for the 04/22/20 encounter (Appointment) with Leanor Kail, [place].     Allergies:   Patient has no known allergies.   Social History   Socioeconomic History  . Marital status: Single    Spouse name: Not on file  . Number of children: 0  . Years of education: 9 th  . Highest education level: Not on file  Occupational History  . Occupation: Corporate treasurer    Comment: Disabled  Tobacco Use  . Smoking status: Current Every Day Smoker    Packs/day: 1.00    Years: 50.00    Pack years: 50.00  Types: Cigars, Cigarettes  . Smokeless tobacco: Never Used  Vaping Use  . Vaping Use: Never used  Substance and Sexual Activity  . Alcohol use: No    Comment: 05/15/2013 "aien't drank in ~ 25 yrs"  . Drug use: Yes    Types: Marijuana, Cocaine    Comment: Precription drugs (e.g. percocet) "I take what I have to to controll my constant pain" (05/15/2013)  . Sexual activity: Never  Other Topics Concern  . Not on file  Social History Narrative   ** Merged History Encounter **   Patient lives at home with his parents and he is  disabled.   Education 9th grade education   Right handed   Caffeine one cup daily       Social Determinants of Health   Financial Resource Strain:   . Difficulty of Paying Living Expenses: Not on file  Food Insecurity:   . Worried About Charity fundraiser in the Last Year: Not on file  . Ran Out of Food in the Last Year: Not on file  Transportation Needs:   . Lack of Transportation (Medical): Not on file  . Lack of Transportation (Non-Medical): Not on file  Physical Activity:   . Days of Exercise per Week: Not on file  . Minutes of Exercise per Session: Not on file  Stress:   . Feeling of Stress : Not on file  Social Connections:   . Frequency of Communication with Friends and Family: Not on file  . Frequency of Social Gatherings with Friends and Family: Not on file  . Attends Religious Services: Not on file  . Active Member of Clubs or Organizations: Not on file  . Attends Archivist Meetings: Not on file  . Marital Status: Not on file     Family History: The patient's family history includes Heart attack in his father.  ***  ROS:   Please see the history of present illness.    All other systems reviewed and are negative. ***  EKGs/Labs/Other Studies Reviewed:    The following studies were reviewed today: ***  EKG:  EKG is *** ordered today.  The ekg ordered today demonstrates ***  Recent Labs: 01/20/2020: B Natriuretic Peptide >4,500.0 03/25/2020: Magnesium 2.8 03/28/2020: ALT 154; TSH 2.510 04/01/2020: BUN 60; Creatinine, Ser 7.10; Potassium 5.3; Sodium 133 04/06/2020: Hemoglobin 10.2; Platelets 251  Recent Lipid Panel No results found for: CHOL, TRIG, HDL, CHOLHDL, VLDL, LDLCALC, LDLDIRECT   Risk Assessment/Calculations:   {Does this patient have ATRIAL FIBRILLATION?:[id number]}   Physical Exam:    VS:  There were no vitals taken for this visit.    Wt Readings from Last 3 Encounters:  04/06/20 156 lb 6.4 oz (70.9 kg)  04/01/20 147 lb 7.8 oz  (66.9 kg)  01/23/20 160 lb 0.9 oz (72.6 kg)     GEN: *** Well nourished, well developed in no acute distress HEENT: Normal NECK: No JVD; No carotid bruits LYMPHATICS: No lymphadenopathy CARDIAC: ***RRR, no murmurs, rubs, gallops RESPIRATORY:  Clear to auscultation without rales, wheezing or rhonchi  ABDOMEN: Soft, non-tender, non-distended MUSCULOSKELETAL:  No edema; No deformity  SKIN: Warm and dry NEUROLOGIC:  Alert and oriented x 3 PSYCHIATRIC:  Normal affect   ASSESSMENT AND PLAN:    1. ***  2. ***  Medication Adjustments/Labs and Tests Ordered: Current medicines are reviewed at length with the patient today.  Concerns regarding medicines are outlined above.  No orders of the defined types were placed in this encounter.  No orders of the defined types were placed in this encounter.   There are no Patient Instructions on file for this visit.   Jarrett Soho, [place]  04/22/2020 9:36 AM    [facility]

## 2020-04-24 DIAGNOSIS — N2581 Secondary hyperparathyroidism of renal origin: Secondary | ICD-10-CM | POA: Diagnosis not present

## 2020-04-24 DIAGNOSIS — N186 End stage renal disease: Secondary | ICD-10-CM | POA: Diagnosis not present

## 2020-04-24 DIAGNOSIS — Z992 Dependence on renal dialysis: Secondary | ICD-10-CM | POA: Diagnosis not present

## 2020-04-27 DIAGNOSIS — N186 End stage renal disease: Secondary | ICD-10-CM | POA: Diagnosis not present

## 2020-04-27 DIAGNOSIS — N2581 Secondary hyperparathyroidism of renal origin: Secondary | ICD-10-CM | POA: Diagnosis not present

## 2020-04-27 DIAGNOSIS — Z992 Dependence on renal dialysis: Secondary | ICD-10-CM | POA: Diagnosis not present

## 2020-04-28 ENCOUNTER — Encounter: Payer: Medicare HMO | Admitting: Gastroenterology

## 2020-04-29 DIAGNOSIS — N186 End stage renal disease: Secondary | ICD-10-CM | POA: Diagnosis not present

## 2020-04-29 DIAGNOSIS — Z992 Dependence on renal dialysis: Secondary | ICD-10-CM | POA: Diagnosis not present

## 2020-04-29 DIAGNOSIS — N2581 Secondary hyperparathyroidism of renal origin: Secondary | ICD-10-CM | POA: Diagnosis not present

## 2020-05-01 DIAGNOSIS — N2581 Secondary hyperparathyroidism of renal origin: Secondary | ICD-10-CM | POA: Diagnosis not present

## 2020-05-01 DIAGNOSIS — N186 End stage renal disease: Secondary | ICD-10-CM | POA: Diagnosis not present

## 2020-05-01 DIAGNOSIS — Z992 Dependence on renal dialysis: Secondary | ICD-10-CM | POA: Diagnosis not present

## 2020-05-04 DIAGNOSIS — N186 End stage renal disease: Secondary | ICD-10-CM | POA: Diagnosis not present

## 2020-05-04 DIAGNOSIS — N2581 Secondary hyperparathyroidism of renal origin: Secondary | ICD-10-CM | POA: Diagnosis not present

## 2020-05-04 DIAGNOSIS — Z992 Dependence on renal dialysis: Secondary | ICD-10-CM | POA: Diagnosis not present

## 2020-05-06 DIAGNOSIS — N2581 Secondary hyperparathyroidism of renal origin: Secondary | ICD-10-CM | POA: Diagnosis not present

## 2020-05-06 DIAGNOSIS — Z992 Dependence on renal dialysis: Secondary | ICD-10-CM | POA: Diagnosis not present

## 2020-05-06 DIAGNOSIS — N186 End stage renal disease: Secondary | ICD-10-CM | POA: Diagnosis not present

## 2020-05-08 DIAGNOSIS — N2581 Secondary hyperparathyroidism of renal origin: Secondary | ICD-10-CM | POA: Diagnosis not present

## 2020-05-08 DIAGNOSIS — N186 End stage renal disease: Secondary | ICD-10-CM | POA: Diagnosis not present

## 2020-05-08 DIAGNOSIS — Z992 Dependence on renal dialysis: Secondary | ICD-10-CM | POA: Diagnosis not present

## 2020-05-11 DIAGNOSIS — N186 End stage renal disease: Secondary | ICD-10-CM | POA: Diagnosis not present

## 2020-05-11 DIAGNOSIS — Z992 Dependence on renal dialysis: Secondary | ICD-10-CM | POA: Diagnosis not present

## 2020-05-11 DIAGNOSIS — N2581 Secondary hyperparathyroidism of renal origin: Secondary | ICD-10-CM | POA: Diagnosis not present

## 2020-05-13 DIAGNOSIS — N186 End stage renal disease: Secondary | ICD-10-CM | POA: Diagnosis not present

## 2020-05-13 DIAGNOSIS — N2581 Secondary hyperparathyroidism of renal origin: Secondary | ICD-10-CM | POA: Diagnosis not present

## 2020-05-13 DIAGNOSIS — Z992 Dependence on renal dialysis: Secondary | ICD-10-CM | POA: Diagnosis not present

## 2020-05-15 DIAGNOSIS — N2581 Secondary hyperparathyroidism of renal origin: Secondary | ICD-10-CM | POA: Diagnosis not present

## 2020-05-15 DIAGNOSIS — Z992 Dependence on renal dialysis: Secondary | ICD-10-CM | POA: Diagnosis not present

## 2020-05-15 DIAGNOSIS — I10 Essential (primary) hypertension: Secondary | ICD-10-CM | POA: Diagnosis not present

## 2020-05-15 DIAGNOSIS — N186 End stage renal disease: Secondary | ICD-10-CM | POA: Diagnosis not present

## 2020-05-25 NOTE — Progress Notes (Signed)
Cardiology Office Note:    Date:  05/26/2020   ID:  Jeremy Mccoy, DOB [birth year], MRN [id number]  PCP:  Patient, No Pcp Per  [place] Cardiologist:  Shelva Majestic, MD   [place] Electrophysiologist:  None   Referring MD: No ref. provider found   Chief Complaint:  Hospitalization Follow-up (Atrial flutter, CHF)    Patient Profile:    Jeremy Mccoy is a 55 y.o. 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  TEE 11/21: EF 30-35  Valvular heart disease  TEE 11/21: mod MR, mild MS, mild AI  ESRD on dialysis  Hypertension   COPD  Tobacco abuse  Anemia   GERD  Hx of COVID-19  Cocaine use   Prior CV studies: Transesophageal echocardiogram 03/31/2020 EF 30-35, moderately reduced RVSF, severe LAE, moderate RAE, small pericardial effusion, moderate MR, mild MS, moderate TR, mild AI, grade 2 plaque involving descending aorta  Echocardiogram 01/23/2020 EF 50-55, no RWMA, mild LVH, normal RVSF, severe LAE, mild RAE, small effusion, mild MR, mod MS, severe MAC, AV calcification, trivial AI    Echocardiogram 01/23/14 EF 50-55, mod LVH, Gr 1 DD, mild MR, mild LAE, small effusion   History of Present Illness:    [name] was admitted 11/10-11/17 with atrial flutter with rapid ventricular rate.  He had difficult to control heart rates and was placed on IV amiodarone.  TEE cardioversion was attempted several times but canceled due to hyperkalemia.  He ultimately underwent TEE cardioversion 11/16 with return of normal sinus rhythm.  His TEE demonstrated EF 30-35, moderate MR, mild MS and mild AI.  His low EF was felt to be related to tachycardia.  He will need a repeat echocardiogram in 3 months.  He was discharged on amiodarone 200 mg twice daily for 1 week, then 20 mg daily.  Blood pressure was too low to start GDMT.  He returns for follow-up.  He  missed his initial post hospital f/u.  He did see his PCP.  The patient was having a rash and easy bruising/bleeding.  His PCP reduced his dose of Apixaban to 2.5 mg twice daily.  He is tolerating this well.  The patient overall is more short of breath with certain things since he started on dialysis.  His shortness of breath is improved since he was admitted to the hospital.  He has not had chest pain, orthopnea, leg edema or syncope.  His BP has been much higher since DC.  He did take 1 clonidine b/c of a BP in the 200s.  He has not used cocaine.    Past Medical History:  Diagnosis Date  . Anemia   . Anxiety   . Arthritis    "qwhere" (05/15/2013)  . CHF (congestive heart failure) ([place])   . COPD (chronic obstructive pulmonary disease) ([place])   . Crack cocaine use   . Dental caries   . Depression   . ESRD (end stage renal disease) on dialysis [facility])    TTS: [address] [place] (05/15/2013)  . Headache(784.0)    "get it from going to dialysis; when they get real bad I come to hospital" (05/15/2013)  . Hematemesis/vomiting blood   . History of blood transfusion 10/2011; 04/2013  . HTN (hypertension) 06/12/2012  . Seizures ([place])   . Shortness of breath    "just when I have too much  potassium is too high" (05/15/2013)    Current Medications: Current Meds  Medication Sig  . acetaminophen (TYLENOL) 500 MG tablet Take 1 tablet (500 mg total) by mouth every 8 (eight) hours as needed for mild pain or fever. 6 times a day, 500 mg, 12 to 24 tabs a day, 6,000 to 12,000 mg daily for pain (Patient taking differently: Take 500-1,000 mg by mouth every 8 (eight) hours as needed for mild pain or fever.)  . albuterol (VENTOLIN HFA) 108 (90 Base) MCG/ACT inhaler Inhale 3-6 puffs into the lungs every 6 (six) hours as needed for wheezing or shortness of breath.   Marland Kitchen amiodarone (PACERONE) 200 MG tablet Take 1 tablet (200 mg total) by mouth daily.  Marland Kitchen apixaban (ELIQUIS) 2.5 MG TABS tablet Take 1 tablet (2.5 mg  total) by mouth 2 (two) times daily.  . B Complex-C-Zn-Folic Acid (DIALYVITE/ZINC) TABS Take 1 tablet by mouth daily.   Marland Kitchen bismuth subsalicylate (PEPTO BISMOL) 262 MG/15ML suspension Take 30 mLs by mouth every 6 (six) hours as needed for indigestion or diarrhea or loose stools.  . carvedilol (COREG) 3.125 MG tablet Take 1 tablet (3.125 mg total) by mouth 2 (two) times daily with a meal.  . diphenhydrAMINE (BENADRYL) 25 mg capsule Take 25 mg by mouth every 6 (six) hours as needed for itching.  . famotidine (PEPCID) 20 MG tablet Take 20 mg by mouth 2 (two) times daily as needed for heartburn or indigestion.  . hydrALAZINE (APRESOLINE) 25 MG tablet Take 1 tablet (25 mg total) by mouth 3 (three) times daily.  . hydrOXYzine (ATARAX/VISTARIL) 25 MG tablet Take 1 tablet (25 mg total) by mouth 3 (three) times daily as needed for itching.  . Nutritional Supplements (FEEDING SUPPLEMENT, NEPRO CARB STEADY,) LIQD Take 237 mLs by mouth 3 (three) times daily as needed (Supplement). (Patient taking differently: Take 237 mLs by mouth 3 (three) times a week.)  . pantoprazole (PROTONIX) 40 MG tablet Take 1 tablet (40 mg total) by mouth daily.  . sevelamer carbonate (RENVELA) 800 MG tablet Take 2 tablets (1,600 mg total) by mouth 3 (three) times daily with meals. (Patient taking differently: Take 800-2,400 mg by mouth See admin instructions. Take 2,400 mg by mouth three times a day with meals and 800-1,600 mg with each snack)     Allergies:   Patient has no known allergies.   Social History   Tobacco Use  . Smoking status: Current Every Day Smoker    Packs/day: 1.00    Years: 50.00    Pack years: 50.00    Types: Cigars, Cigarettes  . Smokeless tobacco: Never Used  Vaping Use  . Vaping Use: Never used  Substance Use Topics  . Alcohol use: No    Comment: 05/15/2013 "aien't drank in ~ 25 yrs"  . Drug use: Yes    Types: Marijuana, Cocaine    Comment: Precription drugs (e.g. percocet) "I take what I have to to  controll my constant pain" (05/15/2013)     Family Hx: The patient's family history includes Heart attack in his father.  ROS   EKGs/Labs/Other Test Reviewed:    EKG:  EKG is  ordered today.  The ekg ordered today demonstrates normal sinus rhythm, HR 88, LAD, PRWP, QTc 471 ms, non-specific ST-TW changes, increased artifact   Recent Labs: 01/20/2020: B Natriuretic Peptide >4,500.0 03/25/2020: Magnesium 2.8 03/28/2020: ALT 154; TSH 2.510 04/01/2020: BUN 60; Creatinine, Ser 7.10; Potassium 5.3; Sodium 133 04/06/2020: Hemoglobin 10.2; Platelets 251   Recent Lipid  Panel No results found for: CHOL, TRIG, HDL, CHOLHDL, LDLCALC, LDLDIRECT    Risk Assessment/Calculations:     CHA2DS2-VASc Score = 2  This indicates a 2.2% annual risk of stroke. The patient's score is based upon: CHF History: Yes HTN History: Yes Diabetes History: No Stroke History: No Vascular Disease History: No Age Score: 0 Gender Score: 0     Physical Exam:    VS:  BP (!) 150/106   Pulse 87   Ht 6' (1.829 m)   Wt 153 lb (69.4 kg)   SpO2 94%   BMI 20.75 kg/m     Wt Readings from Last 3 Encounters:  05/26/20 153 lb (69.4 kg)  04/06/20 156 lb 6.4 oz (70.9 kg)  04/01/20 147 lb 7.8 oz (66.9 kg)     Constitutional:      Appearance: Not in distress. Frail.  Neck:     Vascular: No JVR. JVD normal.  Pulmonary:     Effort: Pulmonary effort is normal.     Breath sounds: No wheezing. No rales.  Cardiovascular:     Normal rate. Regular rhythm. Normal S1. Normal S2.     Murmurs: There is no murmur.  Edema:    Peripheral edema absent.  Abdominal:     Palpations: Abdomen is soft.  Skin:    General: Skin is warm and dry.  Neurological:     General: No focal deficit present.     Mental Status: Alert and oriented to person, place and time.     Cranial Nerves: Cranial nerves are intact.       ASSESSMENT & PLAN:    1. Atrial flutter, unspecified type [facility]) He was admitted in November with atrial  flutter with rapid ventricular rate.  He ultimately underwent TEE cardioversion and was maintained on amiodarone therapy.  He is still maintaining sinus rhythm.  He was having difficulty tolerating the higher dose apixaban.  He is currently on apixaban 2.5 mg twice daily.  After the patient left the office I did review his case further with our PharmD to determine if a dose of apixaban 2.5 mg twice daily is adequate given that he is on dialysis.  As his weight is > 60 kg and he is < 65 year old, the appropriate dose is 5 mg twice daily.  I will reach out to him to change him back.  If he cannot tolerate this dose, we will have to change him to Warfarin.    2. HFrEF (heart failure with reduced ejection fraction) (HCC) EF was noted to be 30-35 on TEE.  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  Volume is managed by dialysis.  His blood pressure was running low in the hospital and he could not be placed on GDMT.  His blood pressure is now uncontrolled and he should be able to tolerate typical medicines for heart failure.  He assures me he has stopped using cocaine.  Nonselective beta-blockers should be okay to use.  However, I did caution him that there is a potential risk if he uses cocaine again while taking a nonselective beta-blocker (although lower than a selective beta-blocker).  I will place him on carvedilol 3.125 mg twice daily.  I will also place him back on hydralazine 25 mg 3 times daily.  I will see him back in about 3 weeks.  We can titrate his medications further at that point.  If his blood pressure cannot tolerate further increases, I will arrange a follow-up echocardiogram to ensure that  his ejection fraction has recovered.  3. ESRD (end stage renal disease) ([place]) He remains on dialysis.  4. Valvular heart disease He had moderate mitral regurgitation, mild mitral stenosis, moderate tricuspid regurgitation and mild aortic insufficiency.  His mitral regurgitation may just be related  to annular dilation.  We will reassess his mitral regurgitation when he has his follow-up echocardiogram.  5. Essential hypertension As noted, his blood pressure is uncontrolled.  Start carvedilol and hydralazine as outlined above  6. History of cocaine use As noted, the patient notes that he is no longer using cocaine.  As outlined above, I did caution him that there is a risk of adverse outcomes if he uses cocaine in the setting of beta-blocker therapy.  7. High risk medication use He seems to be tolerating amiodarone.  He had an elevated ALT in the hospital.  I will obtain follow-up LFTs and TSH today.  At follow-up, we will need to arrange PFTs.     Dispo:  Return in about 3 weeks (around 06/16/2020) for Routine Follow Up, w/ Richardson Dopp, PA-C, in person.   Medication Adjustments/Labs and Tests Ordered: Current medicines are reviewed at length with the patient today.  Concerns regarding medicines are outlined above.  Tests Ordered: Orders Placed This Encounter  Procedures  . Hepatic function panel  . TSH  . EKG 12-Lead   Medication Changes: Meds ordered this encounter  Medications  . carvedilol (COREG) 3.125 MG tablet    Sig: Take 1 tablet (3.125 mg total) by mouth 2 (two) times daily with a meal.    Dispense:  180 tablet    Refill:  1  . hydrALAZINE (APRESOLINE) 25 MG tablet    Sig: Take 1 tablet (25 mg total) by mouth 3 (three) times daily.    Dispense:  270 tablet    Refill:  3    Signed, Richardson Dopp, PA-C  05/26/2020 3:28 PM    [facility] [address], [place], [place]  [address] Phone: [phone]; Fax: [phone]

## 2020-05-26 ENCOUNTER — Other Ambulatory Visit: Payer: Self-pay

## 2020-05-26 ENCOUNTER — Encounter: Payer: Self-pay | Admitting: Physician Assistant

## 2020-05-26 ENCOUNTER — Telehealth: Payer: Self-pay | Admitting: Physician Assistant

## 2020-05-26 ENCOUNTER — Ambulatory Visit (INDEPENDENT_AMBULATORY_CARE_PROVIDER_SITE_OTHER): Payer: Medicare HMO | Admitting: Physician Assistant

## 2020-05-26 VITALS — BP 150/106 | HR 87 | Ht 72.0 in | Wt 153.0 lb

## 2020-05-26 DIAGNOSIS — F1491 Cocaine use, unspecified, in remission: Secondary | ICD-10-CM

## 2020-05-26 DIAGNOSIS — I4892 Unspecified atrial flutter: Secondary | ICD-10-CM | POA: Diagnosis not present

## 2020-05-26 DIAGNOSIS — I38 Endocarditis, valve unspecified: Secondary | ICD-10-CM | POA: Diagnosis not present

## 2020-05-26 DIAGNOSIS — N186 End stage renal disease: Secondary | ICD-10-CM

## 2020-05-26 DIAGNOSIS — Z79899 Other long term (current) drug therapy: Secondary | ICD-10-CM

## 2020-05-26 DIAGNOSIS — Z720 Tobacco use: Secondary | ICD-10-CM

## 2020-05-26 DIAGNOSIS — Z87898 Personal history of other specified conditions: Secondary | ICD-10-CM

## 2020-05-26 DIAGNOSIS — I5020 Unspecified systolic (congestive) heart failure: Secondary | ICD-10-CM | POA: Diagnosis not present

## 2020-05-26 DIAGNOSIS — I10 Essential (primary) hypertension: Secondary | ICD-10-CM

## 2020-05-26 MED ORDER — CARVEDILOL 3.125 MG PO TABS: 3.1250 mg | ORAL_TABLET | Freq: Two times a day (BID) | ORAL | 1 refills | Status: DC

## 2020-05-26 MED ORDER — HYDRALAZINE HCL 25 MG PO TABS: 25.0000 mg | ORAL_TABLET | Freq: Three times a day (TID) | ORAL | 3 refills | Status: AC

## 2020-05-26 NOTE — Patient Instructions (Addendum)
Medication Instructions:  Your physician has recommended you make the following change in your medication:   1) Start Hydralazine 25 mg, 1 tablet by mouth three times a day  *If you need a refill on your cardiac medications before your next appointment, please call your pharmacy*  Lab Work: You will have labs drawn today: LFTs/TSH  Testing/Procedures: None ordered today  Follow-Up: At [facility], you and your health needs are our priority.  As part of our continuing mission to provide you with exceptional heart care, we have created designated Provider Care Teams.  These Care Teams include your primary Cardiologist (physician) and Advanced Practice Providers (APPs -  Physician Assistants and Nurse Practitioners) who all work together to provide you with the care you need, when you need it.  Your next appointment:   3 week(s)  The format for your next appointment:   In Person  Provider:   Richardson Dopp, PA-C

## 2020-05-26 NOTE — Telephone Encounter (Signed)
I called and left patient a message to call back. 

## 2020-05-26 NOTE — Telephone Encounter (Signed)
Please call patient. I discussed his case with our PharmD.  The most appropriate dose of Apixaban for him is 5 mg twice daily based upon his weight and age. This is the most effective dose to reduce his risk of stroke.  PLAN:  1. Increase Apixaban back to 5 mg twice daily. 2. If he cannot tolerate this, let us know.  I will refer him to our coumadin clinic to be transitioned to coumadin. Richardson Dopp, PA-C    05/26/2020 3:25 PM

## 2020-05-27 ENCOUNTER — Telehealth: Payer: Self-pay

## 2020-05-27 LAB — HEPATIC FUNCTION PANEL
ALT: 9 IU/L (ref 0–44)
AST: 13 IU/L (ref 0–40)
Albumin: 4.6 g/dL (ref 3.8–4.9)
Alkaline Phosphatase: 143 IU/L — ABNORMAL HIGH (ref 44–121)
Bilirubin Total: 0.4 mg/dL (ref 0.0–1.2)
Bilirubin, Direct: 0.23 mg/dL (ref 0.00–0.40)
Total Protein: 7.5 g/dL (ref 6.0–8.5)

## 2020-05-27 LAB — TSH: TSH: 1.63 u[IU]/mL (ref 0.450–4.500)

## 2020-05-27 NOTE — Telephone Encounter (Signed)
Attempted to call the patient regarding his recent results. Call can not be completed as dialed at this time, unable to leave a message.

## 2020-05-27 NOTE — Telephone Encounter (Signed)
-----  Message from Liliane Shi, PA-C sent at 05/27/2020 12:59 PM EST ----- TSH, ALT normal. Alk Phos elevated.  PLAN: F/u with PCP for elevated Alk Phos.  Continue current medications.  Please make sure patient has f/u with me in 3 weeks.  Richardson Dopp, PA-C  05/27/2020 12:59

## 2020-05-29 NOTE — Telephone Encounter (Signed)
I called and left patient a message to call back. 

## 2020-06-05 NOTE — Telephone Encounter (Signed)
I called and left patient and his sister a message to call the office.

## 2020-06-10 NOTE — Telephone Encounter (Signed)
Unable to reach letter mailed

## 2020-06-10 NOTE — Telephone Encounter (Signed)
Yes please send a letter. Thank you, Ky Barban, [place]    06/10/2020 4:46 PM

## 2020-06-15 ENCOUNTER — Telehealth: Payer: Self-pay | Admitting: Physician Assistant

## 2020-06-15 NOTE — Telephone Encounter (Signed)
Spoke with the pt and endorsed to him his lab results and recommendations per [employer] PA-C.  Pts labs were resulted back on 05/26/20 and pt is now returning a call back to receive these, after several attempts were made by nursing.  Provided the pt the contact number with [facility], in assisting him with getting a new pt appt to establish with a PCP in his area.  He was instructed to call this number for assistance with this.  Informed the pt that Nicki Reaper wanted to see him back in 3 weeks from 1/11 result.  Scheduled the pt to come back into the clinic to see Richardson Dopp PA-C at next available in office visit, for 06/23/20 at 1115.  Pt is aware to come 15 mins prior to this appt. Pt verbalized understanding and agrees with this plan.    Thora Lance, RN  06/08/2020 4:07 PM EST Back to Top     Letter mailed re: need to contact office for lab results. See letter for complete details.   Darrell Jewel, RN  06/03/2020 10:03 AM EST      Call can not be completed at this time   Darrell Jewel, RN  06/02/2020 8:50 AM EST      Left a message to call back   Stanton Kidney, RN  05/28/2020 12:27 PM EST      Unable to reach pt on preferred number. Left message to call back on cell   Liliane Shi, PA-C  05/27/2020 12:59 PM EST      TSH, ALT normal. Alk Phos elevated.  PLAN: F/u with PCP for elevated Alk Phos.  Continue current medications.  Please make sure patient has f/u with me in 3 weeks.  Richardson Dopp, PA-C  05/27/2020 12:59

## 2020-06-15 NOTE — Telephone Encounter (Signed)
Follow Up:    Pt is calling back, concerning his lab results.

## 2020-06-22 NOTE — Progress Notes (Deleted)
Cardiology Office Note:    Date:  06/22/2020   ID:  Jeremy Mccoy, DOB 02-18-1966, MRN [id number]  PCP:  Patient, No Pcp Per  [facility] Cardiologist:  Fransico Him, MD *** [place] Electrophysiologist:  None   Referring MD: No ref. provider found   Chief Complaint:  No chief complaint on file.    Patient Profile:    Jeremy Mccoy is a 55 y.o. male with:   Atrial flutter ? admx 11/21 w RVR >>s/p TEE-DCCV ? Amiodarone Rx   Heart failure with reduced ejection fraction  ? 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 TEE 11/21: EF 30-35  Valvular heart disease ? TEE 11/21: mod MR, mild MS, mild AI  ESRD on dialysis  Hypertension   COPD  Tobacco abuse  Anemia   GERD  Hx of COVID-19  Cocaine use   Prior CV studies: Transesophageal echocardiogram 03/31/2020 EF 30-35, moderately reduced RVSF, severe LAE, moderate RAE, small pericardial effusion, moderate MR, mild MS, moderate TR, mild AI, grade 2 plaque involving descending aorta  Echocardiogram 01/23/2020 EF 50-55, no RWMA, mild LVH, normal RVSF, severe LAE, mild RAE, small effusion, mild MR, mod MS, severe MAC, AV calcification, trivial AI    Echocardiogram 01/23/14 EF 50-55, mod LVH, Gr 1 DD, mild MR, mild LAE, small effusion   History of Present Illness:    [name] was admitted in 03/2020 with AFlutter w RVR.  He ultimately underwent TEE - DCCV.  His EF was 30-35 and felt to likely be due to tachycardia.  He was last seen in 05/2020.  His dose of Apixaban had been reduced by primary care.  I reviewed with our PharmD after his visit and confirmed that his dose of Apixaban should be 5 mg twice daily.  We tried to reach him to adjust his dose and f/u on labs drawn but had difficulty reaching him.  He returns for f/u.  ***      Past Medical History:  Diagnosis Date  . Anemia   . Anxiety   . Arthritis    "qwhere" (05/15/2013)  . CHF (congestive heart failure) ([place])   . COPD (chronic obstructive pulmonary  disease) ([place])   . Crack cocaine use   . Dental caries   . Depression   . ESRD (end stage renal disease) on dialysis [facility])    TTS: [address] [place] (05/15/2013)  . Headache(784.0)    "get it from going to dialysis; when they get real bad I come to hospital" (05/15/2013)  . Hematemesis/vomiting blood   . History of blood transfusion 10/2011; 04/2013  . HTN (hypertension) 06/12/2012  . Seizures ([place])   . Shortness of breath    "just when I have too much potassium is too high" (05/15/2013)    Current Medications: No outpatient medications have been marked as taking for the 06/23/20 encounter (Appointment) with Richardson Dopp T, PA-C.     Allergies:   Patient has no known allergies.   Social History   Tobacco Use  . Smoking status: Current Every Day Smoker    Packs/day: 1.00    Years: 50.00    Pack years: 50.00    Types: Cigars, Cigarettes  . Smokeless tobacco: Never Used  Vaping Use  . Vaping Use: Never used  Substance Use Topics  . Alcohol use: No    Comment: 05/15/2013 "aien't drank in ~ 25 yrs"  . Drug use: Yes    Types: Marijuana, Cocaine    Comment: Precription drugs (e.g. percocet) "  I take what I have to to controll my constant pain" (05/15/2013)     Family Hx: The patient's family history includes Heart attack in his father.  ROS   EKGs/Labs/Other Test Reviewed:    EKG:  EKG is *** ordered today.  The ekg ordered today demonstrates ***  Recent Labs: 01/20/2020: B Natriuretic Peptide >4,500.0 03/25/2020: Magnesium 2.8 04/01/2020: BUN 60; Creatinine, Ser 7.10; Potassium 5.3; Sodium 133 04/06/2020: Hemoglobin 10.2; Platelets 251 05/26/2020: ALT 9; TSH 1.630   Recent Lipid Panel No results found for: CHOL, TRIG, HDL, CHOLHDL, LDLCALC, LDLDIRECT    Risk Assessment/Calculations:   {Does this patient have ATRIAL FIBRILLATION?:[id number]}  Physical Exam:    VS:  There were no vitals taken for this visit.    Wt Readings from Last 3 Encounters:  05/26/20  153 lb (69.4 kg)  04/06/20 156 lb 6.4 oz (70.9 kg)  04/01/20 147 lb 7.8 oz (66.9 kg)     Physical Exam ***  ASSESSMENT & PLAN:    *** 1. Atrial flutter, unspecified type [facility]) He was admitted in November with atrial flutter with rapid ventricular rate.  He ultimately underwent TEE cardioversion and was maintained on amiodarone therapy.  He is still maintaining sinus rhythm.  He was having difficulty tolerating the higher dose apixaban.  He is currently on apixaban 2.5 mg twice daily.  After the patient left the office I did review his case further with our PharmD to determine if a dose of apixaban 2.5 mg twice daily is adequate given that he is on dialysis.  As his weight is > 60 kg and he is < 12 year old, the appropriate dose is 5 mg twice daily.  I will reach out to him to change him back.  If he cannot tolerate this dose, we will have to change him to Warfarin.    2. HFrEF (heart failure with reduced ejection fraction) (HCC) EF was noted to be 30-35 on TEE.  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  Volume is managed by dialysis.  His blood pressure was running low in the hospital and he could not be placed on GDMT.  His blood pressure is now uncontrolled and he should be able to tolerate typical medicines for heart failure.  He assures me he has stopped using cocaine.  Nonselective beta-blockers should be okay to use.  However, I did caution him that there is a potential risk if he uses cocaine again while taking a nonselective beta-blocker (although lower than a selective beta-blocker).  I will place him on carvedilol 3.125 mg twice daily.  I will also place him back on hydralazine 25 mg 3 times daily.  I will see him back in about 3 weeks.  We can titrate his medications further at that point.  If his blood pressure cannot tolerate further increases, I will arrange a follow-up echocardiogram to ensure that his ejection fraction has recovered.  3. ESRD (end stage renal disease)  ([place]) He remains on dialysis.  4. Valvular heart disease He had moderate mitral regurgitation, mild mitral stenosis, moderate tricuspid regurgitation and mild aortic insufficiency.  His mitral regurgitation may just be related to annular dilation.  We will reassess his mitral regurgitation when he has his follow-up echocardiogram.  5. Essential hypertension As noted, his blood pressure is uncontrolled.  Start carvedilol and hydralazine as outlined above  6. History of cocaine use As noted, the patient notes that he is no longer using cocaine.  As outlined above, I did caution  him that there is a risk of adverse outcomes if he uses cocaine in the setting of beta-blocker therapy.  7. High risk medication use He seems to be tolerating amiodarone.  He had an elevated ALT in the hospital.  I will obtain follow-up LFTs and TSH today.  At follow-up, we will need to arrange PFTs. {Are you ordering a CV Procedure (e.g. stress test, cath, DCCV, TEE, etc)?   Press F2        :[id number]}    Dispo:  No follow-ups on file.   Medication Adjustments/Labs and Tests Ordered: Current medicines are reviewed at length with the patient today.  Concerns regarding medicines are outlined above.  Tests Ordered: No orders of the defined types were placed in this encounter.  Medication Changes: No orders of the defined types were placed in this encounter.   Signed, Richardson Dopp, PA-C  06/22/2020 2:28 PM    [facility] [address], [place], [place]  [address] Phone: [phone]; Fax: [phone]

## 2020-06-23 ENCOUNTER — Ambulatory Visit: Payer: Medicare HMO | Admitting: Physician Assistant

## 2020-06-23 DIAGNOSIS — N186 End stage renal disease: Secondary | ICD-10-CM

## 2020-06-23 DIAGNOSIS — I5020 Unspecified systolic (congestive) heart failure: Secondary | ICD-10-CM

## 2020-06-23 DIAGNOSIS — I10 Essential (primary) hypertension: Secondary | ICD-10-CM

## 2020-06-23 DIAGNOSIS — I4892 Unspecified atrial flutter: Secondary | ICD-10-CM

## 2020-06-23 DIAGNOSIS — I38 Endocarditis, valve unspecified: Secondary | ICD-10-CM

## 2020-08-21 ENCOUNTER — Telehealth: Payer: Self-pay

## 2020-08-21 NOTE — Telephone Encounter (Signed)
Patient last seen in July 2021. 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 Potentially needs plication - patient called today to report he thinks they need to be evaluated again - denies any issues with access running - just thin areas he is afraid will bleed. Added him on to PA schedule on CSD day next week.

## 2020-08-26 ENCOUNTER — Ambulatory Visit (INDEPENDENT_AMBULATORY_CARE_PROVIDER_SITE_OTHER): Payer: Medicare HMO | Admitting: Physician Assistant

## 2020-08-26 ENCOUNTER — Other Ambulatory Visit: Payer: Self-pay

## 2020-08-26 VITALS — BP 130/84 | HR 106 | Temp 98.1°F | Resp 20 | Ht 72.0 in | Wt 158.8 lb

## 2020-08-26 DIAGNOSIS — N186 End stage renal disease: Secondary | ICD-10-CM | POA: Diagnosis not present

## 2020-08-26 DIAGNOSIS — Z992 Dependence on renal dialysis: Secondary | ICD-10-CM

## 2020-08-26 NOTE — Progress Notes (Signed)
HISTORY AND PHYSICAL     CC:  dialysis access Requesting Provider:  No ref. provider found  HPI: This is a 55 y.o. male here for evaluation of his hemodialysis access.  Pt has hx of left RC AVF created in 2013 by Dr. Scot Dock.    Pt is on dialysis.   Days of dialysis if applicable:  M/W/F    HD center if applicable:  [facility] location.  He states that he was seen last year for thinning skin over his fistula.  He is referred back today for this.  He states that sometimes when they stick the distal aneurysmal area, he develops a scab so they have been sticking on the side of the aneurysmal areas and proximal to these areas.  Depending on who is sticking the fistula, it works well. He has not had prolonged bleeding.    The pt is not on a statin for cholesterol management.  The pt is not on a daily aspirin.  Other AC:  Eliquis The pt is on BB for hypertension.  The pt is not diabetic.   Tobacco hx:  current  Past Medical History:  Diagnosis Date  . Anemia   . Anxiety   . Arthritis    "qwhere" (05/15/2013)  . CHF (congestive heart failure) ([place])   . COPD (chronic obstructive pulmonary disease) ([place])   . Crack cocaine use   . Dental caries   . Depression   . ESRD (end stage renal disease) on dialysis [facility])    TTS: [address] [place] (05/15/2013)  . Headache(784.0)    "get it from going to dialysis; when they get real bad I come to hospital" (05/15/2013)  . Hematemesis/vomiting blood   . History of blood transfusion 10/2011; 04/2013  . HTN (hypertension) 06/12/2012  . Seizures ([place])   . Shortness of breath    "just when I have too much potassium is too high" (05/15/2013)    Past Surgical History:  Procedure Laterality Date  . AV FISTULA PLACEMENT  11/29/2011   Procedure: ARTERIOVENOUS (AV) FISTULA CREATION;  Surgeon: Angelia Mould, MD;  Location: [place];  Service: Vascular;  Laterality: Left;  . CARDIOVERSION N/A 03/31/2020   Procedure: CARDIOVERSION;  Surgeon:  Lelon Perla, MD;  Location: [facility] ENDOSCOPY;  Service: Cardiovascular;  Laterality: N/A;  . ESOPHAGOGASTRODUODENOSCOPY N/A 05/17/2013   Procedure: ESOPHAGOGASTRODUODENOSCOPY (EGD);  Surgeon: Beryle Beams, MD;  Location: [facility] ENDOSCOPY;  Service: Endoscopy;  Laterality: N/A;  . INGUINAL HERNIA REPAIR Bilateral 1967  . IR FLUORO GUIDE CV LINE RIGHT  10/20/2017  . IR THROMBECTOMY AV FISTULA W/THROMBOLYSIS/PTA INC/SHUNT/IMG LEFT Left 10/24/2017  . IR US GUIDE VASC ACCESS LEFT  10/24/2017  . IR US GUIDE VASC ACCESS RIGHT  10/20/2017  . RENAL BIOPSY  11/07/2011  . TEE WITHOUT CARDIOVERSION N/A 03/31/2020   Procedure: TRANSESOPHAGEAL ECHOCARDIOGRAM (TEE);  Surgeon: Lelon Perla, MD;  Location: [facility] ENDOSCOPY;  Service: Cardiovascular;  Laterality: N/A;    No Known Allergies  Current Outpatient Medications  Medication Sig Dispense Refill  . acetaminophen (OFIRMEV) 10 MG/ML SOLN Take by mouth.    Marland Kitchen acetaminophen (TYLENOL) 500 MG tablet Take 1 tablet (500 mg total) by mouth every 8 (eight) hours as needed for mild pain or fever. 6 times a day, 500 mg, 12 to 24 tabs a day, 6,000 to 12,000 mg daily for pain (Patient taking differently: Take 500-1,000 mg by mouth every 8 (eight) hours as needed for mild pain or fever.) 30 tablet 0  .  albuterol (VENTOLIN HFA) 108 (90 Base) MCG/ACT inhaler Inhale 3-6 puffs into the lungs every 6 (six) hours as needed for wheezing or shortness of breath.     Marland Kitchen amiodarone (PACERONE) 200 MG tablet TAKE 1 TABLET (200 MG TOTAL) BY MOUTH TWO TIMES DAILY FOR 7 DAYS. THEN TAKE 1 TABLET DAILY 37 tablet 0  . apixaban (ELIQUIS) 2.5 MG TABS tablet Take 1 tablet (2.5 mg total) by mouth 2 (two) times daily. 60 tablet 12  . B Complex-C-Zn-Folic Acid (DIALYVITE/ZINC) TABS Take 1 tablet by mouth daily.     Marland Kitchen bismuth subsalicylate (PEPTO BISMOL) 262 MG/15ML suspension Take 30 mLs by mouth every 6 (six) hours as needed for indigestion or diarrhea or loose stools.    . carvedilol (COREG) 3.125 MG  tablet Take 1 tablet (3.125 mg total) by mouth 2 (two) times daily with a meal. 180 tablet 1  . diphenhydrAMINE (BENADRYL) 25 mg capsule Take 25 mg by mouth every 6 (six) hours as needed for itching.    Marland Kitchen DIPHENHYDRAMINE HCL PO Take by mouth.    . doxercalciferol (HECTOROL) 0.5 MCG capsule Doxercalciferol (Hectorol)    . famotidine (PEPCID) 20 MG tablet Take 20 mg by mouth 2 (two) times daily as needed for heartburn or indigestion.    . hydrALAZINE (APRESOLINE) 25 MG tablet Take 1 tablet (25 mg total) by mouth 3 (three) times daily. 270 tablet 3  . hydrOXYzine (ATARAX/VISTARIL) 25 MG tablet Take 1 tablet (25 mg total) by mouth 3 (three) times daily as needed for itching. 90 tablet 0  . Methoxy PEG-Epoetin Beta (MIRCERA IJ) Mircera    . Nutritional Supplements (FEEDING SUPPLEMENT, NEPRO CARB STEADY,) LIQD Take 237 mLs by mouth 3 (three) times daily as needed (Supplement). (Patient taking differently: Take 237 mLs by mouth 3 (three) times a week.)  0  . pantoprazole (PROTONIX) 40 MG tablet TAKE 1 TABLET (40 MG TOTAL) BY MOUTH DAILY. 30 tablet 0  . sevelamer carbonate (RENVELA) 800 MG tablet Take 2 tablets (1,600 mg total) by mouth 3 (three) times daily with meals. (Patient taking differently: Take 800-2,400 mg by mouth See admin instructions. Take 2,400 mg by mouth three times a day with meals and 800-1,600 mg with each snack) 90 tablet 0   No current facility-administered medications for this visit.    Family History  Problem Relation Age of Onset  . Heart attack Father     Social History   Socioeconomic History  . Marital status: Single    Spouse name: Not on file  . Number of children: 0  . Years of education: 9 th  . Highest education level: Not on file  Occupational History  . Occupation: Corporate treasurer    Comment: Disabled  Tobacco Use  . Smoking status: Current Every Day Smoker    Packs/day: 1.00    Years: 50.00    Pack years: 50.00    Types: Cigars, Cigarettes  .  Smokeless tobacco: Never Used  Vaping Use  . Vaping Use: Never used  Substance and Sexual Activity  . Alcohol use: No    Comment: 05/15/2013 "aien't drank in ~ 25 yrs"  . Drug use: Yes    Types: Marijuana, Cocaine    Comment: Precription drugs (e.g. percocet) "I take what I have to to controll my constant pain" (05/15/2013)  . Sexual activity: Never  Other Topics Concern  . Not on file  Social History Narrative   ** Merged History Encounter **   Patient lives at home  with his parents and he is disabled.   Education 9th grade education   Right handed   Caffeine one cup daily       Social Determinants of Health   Financial Resource Strain: Not on file  Food Insecurity: Not on file  Transportation Needs: Not on file  Physical Activity: Not on file  Stress: Not on file  Social Connections: Not on file  Intimate Partner Violence: Not on file     ROS: '[x]'$  Positive   '[ ]'$  Negative   '[ ]'$  All sytems reviewed and are negative  Cardiac: '[]'$  chest pain/pressure '[]'$  SOB '[]'$  DOE  Vascular: '[]'$  pain in legs while walking '[]'$  pain in feet when lying flat '[]'$  hx of DVT '[]'$  swelling in legs  Pulmonary: '[]'$  asthma '[]'$  wheezing  Neurologic: '[]'$  weakness in '[]'$  arms '[]'$  legs '[]'$  numbness in '[]'$  arms '[]'$  legs '[]'$ difficulty speaking or slurred speech  Hematologic: '[]'$  bleeding problems  GI '[]'$  GERD  GU: '[x]'$  CKD/renal failure  '[x]'$  HD---'[x]'$  M/W/F '[]'$  T/T/S  Psychiatric: '[]'$  hx of major depression  Integumentary: '[]'$  rashes '[]'$  ulcers  Constitutional: '[]'$  fever '[]'$  chills   PHYSICAL EXAMINATION:  Today's Vitals   08/26/20 1459  BP: 130/84  Pulse: (!) 106  Resp: 20  Temp: 98.1 F (36.7 C)  TempSrc: Temporal  SpO2: 95%  Weight: 158 lb 12.8 oz (72 kg)  Height: 6' (1.829 m)   Body mass index is 21.54 kg/m.    General:  WDWN male in NAD Gait: Normal HENT: WNL Pulmonary: normal non-labored breathing , without Rales, rhonchi,  wheezing Cardiac: regular, without carotid  bruits Abdomen: soft, NT, no masses; aortic pulse is not palpable Skin: without rashes Vascular Exam/Pulses:   Right Left  Radial 2+ (normal) 2+ (normal)   Extremities:  Left arm fistula with +thrill; there are two aneursmal areas-the skin over these areas are not adhered to the fistula.  There are no eschar areas.    Musculoskeletal: no muscle wasting or atrophy  Neurologic: A&O X 3; Speech is fluent/normal  *   ASSESSMENT/PLAN: 55 y.o. male with ESRD here for evaluation of his hemodialysis access with hx of left RC AVF created in 2013  -skin over aneurysmal areas are not adhered to fistula.  They are not sticking over these areas.  Advised pt to continue to not stick these areas.  He has not had prolonged bleeding and the fistula is running well on HD.  I discussed with pt if he develops eschar over the aneurysmal areas, he would need a plication.  Would not recommend revision at this time.   -discussed with pt how to hold pressure should he develop any bleeding episodes and call 911.   He is in agreement with this plan.  He will contact us if he has any issues.     Leontine Locket, [facility] [phone]  Clinic MD:   Scot Dock

## 2020-12-18 ENCOUNTER — Emergency Department (HOSPITAL_COMMUNITY): Payer: Medicare HMO

## 2020-12-18 ENCOUNTER — Observation Stay (HOSPITAL_COMMUNITY)
Admission: EM | Admit: 2020-12-18 | Discharge: 2020-12-20 | Disposition: A | Discharge status: Home / Self Care | Payer: Medicare HMO | Attending: Family Medicine | Admitting: Family Medicine

## 2020-12-18 ENCOUNTER — Encounter (HOSPITAL_COMMUNITY): Payer: Self-pay | Admitting: Pharmacy Technician

## 2020-12-18 DIAGNOSIS — Z79899 Other long term (current) drug therapy: Secondary | ICD-10-CM | POA: Diagnosis not present

## 2020-12-18 DIAGNOSIS — F17210 Nicotine dependence, cigarettes, uncomplicated: Secondary | ICD-10-CM | POA: Insufficient documentation

## 2020-12-18 DIAGNOSIS — R002 Palpitations: Secondary | ICD-10-CM | POA: Diagnosis present

## 2020-12-18 DIAGNOSIS — Z20822 Contact with and (suspected) exposure to covid-19: Secondary | ICD-10-CM | POA: Diagnosis not present

## 2020-12-18 DIAGNOSIS — J449 Chronic obstructive pulmonary disease, unspecified: Secondary | ICD-10-CM | POA: Insufficient documentation

## 2020-12-18 DIAGNOSIS — I480 Paroxysmal atrial fibrillation: Secondary | ICD-10-CM | POA: Diagnosis not present

## 2020-12-18 DIAGNOSIS — R1310 Dysphagia, unspecified: Secondary | ICD-10-CM

## 2020-12-18 DIAGNOSIS — Z992 Dependence on renal dialysis: Secondary | ICD-10-CM | POA: Diagnosis not present

## 2020-12-18 DIAGNOSIS — I5032 Chronic diastolic (congestive) heart failure: Secondary | ICD-10-CM | POA: Diagnosis not present

## 2020-12-18 DIAGNOSIS — I4891 Unspecified atrial fibrillation: Principal | ICD-10-CM | POA: Insufficient documentation

## 2020-12-18 DIAGNOSIS — I132 Hypertensive heart and chronic kidney disease with heart failure and with stage 5 chronic kidney disease, or end stage renal disease: Secondary | ICD-10-CM | POA: Diagnosis not present

## 2020-12-18 DIAGNOSIS — Z7901 Long term (current) use of anticoagulants: Secondary | ICD-10-CM | POA: Diagnosis not present

## 2020-12-18 DIAGNOSIS — N186 End stage renal disease: Secondary | ICD-10-CM | POA: Diagnosis not present

## 2020-12-18 DIAGNOSIS — I429 Cardiomyopathy, unspecified: Secondary | ICD-10-CM | POA: Diagnosis not present

## 2020-12-18 DIAGNOSIS — I5020 Unspecified systolic (congestive) heart failure: Secondary | ICD-10-CM | POA: Insufficient documentation

## 2020-12-18 DIAGNOSIS — Z8616 Personal history of COVID-19: Secondary | ICD-10-CM | POA: Diagnosis not present

## 2020-12-18 LAB — COMPREHENSIVE METABOLIC PANEL
ALT: 13 U/L (ref 0–44)
AST: 12 U/L — ABNORMAL LOW (ref 15–41)
Albumin: 3.5 g/dL (ref 3.5–5.0)
Alkaline Phosphatase: 89 U/L (ref 38–126)
Anion gap: 14 (ref 5–15)
BUN: 34 mg/dL — ABNORMAL HIGH (ref 6–20)
CO2: 30 mmol/L (ref 22–32)
Calcium: 9.7 mg/dL (ref 8.9–10.3)
Chloride: 92 mmol/L — ABNORMAL LOW (ref 98–111)
Creatinine, Ser: 8.66 mg/dL — ABNORMAL HIGH (ref 0.61–1.24)
GFR, Estimated: 7 mL/min — ABNORMAL LOW (ref >60)
Glucose, Bld: 97 mg/dL (ref 70–99)
Potassium: 3.4 mmol/L — ABNORMAL LOW (ref 3.5–5.1)
Sodium: 136 mmol/L (ref 135–145)
Total Bilirubin: 0.4 mg/dL (ref 0.3–1.2)
Total Protein: 7.4 g/dL (ref 6.5–8.1)

## 2020-12-18 LAB — MAGNESIUM: Magnesium: 2.3 mg/dL (ref 1.7–2.4)

## 2020-12-18 LAB — CBC WITH DIFFERENTIAL/PLATELET
Abs Immature Granulocytes: 0.02 10*3/uL (ref 0.00–0.07)
Basophils Absolute: 0 10*3/uL (ref 0.0–0.1)
Basophils Relative: 1 %
Eosinophils Absolute: 0.1 10*3/uL (ref 0.0–0.5)
Eosinophils Relative: 2 %
HCT: 36.9 % — ABNORMAL LOW (ref 39.0–52.0)
Hemoglobin: 11.8 g/dL — ABNORMAL LOW (ref 13.0–17.0)
Immature Granulocytes: 0 %
Lymphocytes Relative: 18 %
Lymphs Abs: 0.9 10*3/uL (ref 0.7–4.0)
MCH: 31.7 pg (ref 26.0–34.0)
MCHC: 32 g/dL (ref 30.0–36.0)
MCV: 99.2 fL (ref 80.0–100.0)
Monocytes Absolute: 0.4 10*3/uL (ref 0.1–1.0)
Monocytes Relative: 9 %
Neutro Abs: 3.4 10*3/uL (ref 1.7–7.7)
Neutrophils Relative %: 70 %
Platelets: 174 10*3/uL (ref 150–400)
RBC: 3.72 MIL/uL — ABNORMAL LOW (ref 4.22–5.81)
RDW: 15.8 % — ABNORMAL HIGH (ref 11.5–15.5)
WBC: 4.9 10*3/uL (ref 4.0–10.5)
nRBC: 0 % (ref 0.0–0.2)

## 2020-12-18 LAB — TROPONIN I (HIGH SENSITIVITY)
Troponin I (High Sensitivity): 99 ng/L — ABNORMAL HIGH (ref <18)
Troponin I (High Sensitivity): 96 ng/L — ABNORMAL HIGH (ref <18)

## 2020-12-18 MED ORDER — HEPARIN (PORCINE) 25000 UT/250ML-% IV SOLN
1250.0000 [IU]/h | INTRAVENOUS | Status: DC
  Administered 2020-12-18: 1050 [IU]/h via INTRAVENOUS
  Filled 2020-12-18: qty 250

## 2020-12-18 MED ORDER — RENA-VITE PO TABS
1.0000 | ORAL_TABLET | Freq: Every day | ORAL | Status: DC
  Administered 2020-12-19: 1 via ORAL
  Filled 2020-12-18: qty 1

## 2020-12-18 MED ORDER — NICOTINE 14 MG/24HR TD PT24
14.0000 mg | MEDICATED_PATCH | Freq: Every day | TRANSDERMAL | Status: DC
  Administered 2020-12-19: 14 mg via TRANSDERMAL
  Filled 2020-12-18: qty 1

## 2020-12-18 MED ORDER — CARVEDILOL 3.125 MG PO TABS: 3.1250 mg | ORAL_TABLET | Freq: Two times a day (BID) | ORAL | Status: DC

## 2020-12-18 MED ORDER — DILTIAZEM HCL-DEXTROSE 125-5 MG/125ML-% IV SOLN (PREMIX): 5.0000 mg/h | INTRAVENOUS | Status: DC

## 2020-12-18 MED ORDER — FAMOTIDINE 20 MG PO TABS: 20.0000 mg | ORAL_TABLET | Freq: Every day | ORAL | Status: DC | PRN

## 2020-12-18 MED ORDER — ACETAMINOPHEN 500 MG PO TABS: 500.0000 mg | ORAL_TABLET | Freq: Three times a day (TID) | ORAL | Status: DC | PRN

## 2020-12-18 MED ORDER — HEPARIN BOLUS VIA INFUSION
4000.0000 [IU] | Freq: Once | INTRAVENOUS | Status: AC
  Administered 2020-12-18: 4000 [IU] via INTRAVENOUS
  Filled 2020-12-18: qty 4000

## 2020-12-18 MED ORDER — METOPROLOL TARTRATE 25 MG PO TABS
25.0000 mg | ORAL_TABLET | Freq: Four times a day (QID) | ORAL | Status: DC
  Administered 2020-12-18 – 2020-12-19 (×2): 25 mg via ORAL
  Filled 2020-12-18 (×2): qty 1

## 2020-12-18 MED ORDER — AMIODARONE HCL IN DEXTROSE 360-4.14 MG/200ML-% IV SOLN: 30.0000 mg/h | INTRAVENOUS | Status: DC

## 2020-12-18 MED ORDER — AMIODARONE HCL IN DEXTROSE 360-4.14 MG/200ML-% IV SOLN
60.0000 mg/h | INTRAVENOUS | Status: DC
  Administered 2020-12-18: 60 mg/h via INTRAVENOUS
  Filled 2020-12-18: qty 200

## 2020-12-18 MED ORDER — UMECLIDINIUM BROMIDE 62.5 MCG/INH IN AEPB: 1.0000 | INHALATION_SPRAY | Freq: Every day | RESPIRATORY_TRACT | Status: DC

## 2020-12-18 MED ORDER — DILTIAZEM HCL 25 MG/5ML IV SOLN
10.0000 mg | Freq: Once | INTRAVENOUS | Status: AC
  Administered 2020-12-18: 10 mg via INTRAVENOUS
  Filled 2020-12-18: qty 5

## 2020-12-18 MED ORDER — SEVELAMER CARBONATE 800 MG PO TABS
1600.0000 mg | ORAL_TABLET | Freq: Three times a day (TID) | ORAL | Status: DC
  Administered 2020-12-19 – 2020-12-20 (×4): 1600 mg via ORAL
  Filled 2020-12-18 (×4): qty 2

## 2020-12-18 MED ORDER — METOPROLOL TARTRATE 25 MG PO TABS: 25.0000 mg | ORAL_TABLET | Freq: Two times a day (BID) | ORAL | Status: DC

## 2020-12-18 MED ORDER — NEPRO/CARBSTEADY PO LIQD: 237.0000 mL | ORAL | Status: DC

## 2020-12-18 MED ORDER — PANTOPRAZOLE SODIUM 40 MG PO TBEC
40.0000 mg | DELAYED_RELEASE_TABLET | Freq: Every day | ORAL | Status: DC
  Administered 2020-12-19: 40 mg via ORAL
  Filled 2020-12-18: qty 1

## 2020-12-18 MED ORDER — HYDROXYZINE HCL 25 MG PO TABS
25.0000 mg | ORAL_TABLET | Freq: Three times a day (TID) | ORAL | Status: DC | PRN
  Administered 2020-12-19: 25 mg via ORAL
  Filled 2020-12-18: qty 1

## 2020-12-18 MED ORDER — ALBUTEROL SULFATE (2.5 MG/3ML) 0.083% IN NEBU: 2.5000 mg | INHALATION_SOLUTION | Freq: Four times a day (QID) | RESPIRATORY_TRACT | Status: DC | PRN

## 2020-12-18 NOTE — ED Provider Notes (Signed)
[facility] EMERGENCY DEPARTMENT Provider Note   CSN: [id number] Arrival date & time: 12/18/20  1431     History Chief Complaint  Patient presents with   Atrial Fibrillation    Jeremy Mccoy is a 55 y.o. male hx of CHF, COPD, ESRD on HD (last HD was today), A. fib off Coreg and Eliquis here presenting with palpitations.  Patient has been having palpitations for the last week or so.  Patient went to dialysis today and states that he has worse shortness of breath and palpitations.  Patient also had chest pain as well.  He was noted to be in rapid A. fib.  He did finish dialysis and was sent here.  Patient states that his doctor just never refilled his Coreg and Eliquis.  However he is still compliant with his amiodarone.  Patient was admitted in November last year and was on amiodarone drip and eventually required cardioversion.   The history is provided by the patient.      Past Medical History:  Diagnosis Date   Anemia    Anxiety    Arthritis    "qwhere" (05/15/2013)   CHF (congestive heart failure) ([facility])    COPD (chronic obstructive pulmonary disease) ([facility])    Crack cocaine use    Dental caries    Depression    ESRD (end stage renal disease) on dialysis ([place])    TTS: [address] [place] (05/15/2013)   Headache(784.0)    "get it from going to dialysis; when they get real bad I come to hospital" (05/15/2013)   Hematemesis/vomiting blood    History of blood transfusion 10/2011; 04/2013   HTN (hypertension) 06/12/2012   Seizures ([place])    Shortness of breath    "just when I have too much potassium is too high" (05/15/2013)    Patient Active Problem List   Diagnosis Date Noted   Nonspecific chest pain    Atrial fibrillation with RVR ([place]) 03/25/2020   Acute respiratory failure due to COVID-19 ([place]) 02/24/2020   Gastrointestinal hemorrhage, unspecified 02/24/2020   Allergy, unspecified, initial encounter 01/31/2020   Anaphylactic shock, unspecified,  initial encounter 01/31/2020   Pleural effusion, not elsewhere classified 01/23/2020   Hyperkalemia, diminished renal excretion 01/20/2020   Plantar fasciitis, bilateral 09/12/2017   Headache, unspecified 05/24/2017   Fever, unspecified 10/05/2016   Hyperkalemia 02/23/2016   Vasculitis ([place])    Hemoptysis 07/25/2015   Encounter for removal of sutures 07/09/2015   Dependence on renal dialysis ([place]) 08/01/2014   Encounter for fitting and adjustment of extracorporeal dialysis catheter ([place]) 08/01/2014   Pruritus, unspecified 06/27/2014   Atrial fibrillation ([place]) 01/31/2014   Chronic diastolic congestive heart failure ([place]) 01/31/2014   COPD (chronic obstructive pulmonary disease) ([place]) 01/31/2014   Thrombocytopenia ([place]) 01/23/2014   Unspecified constipation 01/23/2014   High anion gap metabolic acidosis 0000000   ESRD on hemodialysis ([place]) 11/05/2013   Seizure disorder ([place]) 11/04/2013   Protein-calorie malnutrition, severe ([place]) 07/12/2013   Respiratory failure ([place]) 07/10/2013   Status epilepticus ([place]) 07/10/2013   Essential hypertension 07/10/2013   Hematemesis 05/15/2013   Pruritus ani 04/27/2013   Other specified symptoms and signs involving the circulatory and respiratory systems 04/22/2013   Abnormal EKG 09/20/2012   Drug abuse ([place]) 09/20/2012   Elevated troponin 09/20/2012   Anemia in chronic kidney disease 09/20/2012   Cocaine abuse ([place]) 06/12/2012   End stage renal disease ([place]) 01/11/2012   Iron deficiency anemia, unspecified 12/02/2011   Secondary hyperparathyroidism of renal  origin ([place]) 12/02/2011   Tobacco abuse 11/04/2011   Dental caries 11/04/2011   Color blindness 11/04/2011    Past Surgical History:  Procedure Laterality Date   AV FISTULA PLACEMENT  11/29/2011   Procedure: ARTERIOVENOUS (AV) FISTULA CREATION;  Surgeon: Angelia Mould, MD;  Location: [place];  Service: Vascular;  Laterality: Left;   CARDIOVERSION N/A 03/31/2020   Procedure:  CARDIOVERSION;  Surgeon: Lelon Perla, MD;  Location[facility] ENDOSCOPY;  Service: Cardiovascular;  Laterality: N/A;   ESOPHAGOGASTRODUODENOSCOPY N/A 05/17/2013   Procedure: ESOPHAGOGASTRODUODENOSCOPY (EGD);  Surgeon: Beryle Beams, MD;  Location: [facility] ENDOSCOPY;  Service: Endoscopy;  Laterality: N/A;   INGUINAL HERNIA REPAIR Bilateral 1967   IR FLUORO GUIDE CV LINE RIGHT  10/20/2017   IR THROMBECTOMY AV FISTULA W/THROMBOLYSIS/PTA INC/SHUNT/IMG LEFT Left 10/24/2017   IR US GUIDE VASC ACCESS LEFT  10/24/2017   IR US GUIDE VASC ACCESS RIGHT  10/20/2017   RENAL BIOPSY  11/07/2011   TEE WITHOUT CARDIOVERSION N/A 03/31/2020   Procedure: TRANSESOPHAGEAL ECHOCARDIOGRAM (TEE);  Surgeon: Lelon Perla, MD;  Location: [facility] ENDOSCOPY;  Service: Cardiovascular;  Laterality: N/A;       Family History  Problem Relation Age of Onset   Heart attack Father     Social History   Tobacco Use   Smoking status: Every Day    Packs/day: 1.00    Years: 50.00    Pack years: 50.00    Types: Cigars, Cigarettes   Smokeless tobacco: Never  Vaping Use   Vaping Use: Never used  Substance Use Topics   Alcohol use: No    Comment: 05/15/2013 "aien't drank in ~ 25 yrs"   Drug use: Yes    Types: Marijuana, Cocaine    Comment: Precription drugs (e.g. percocet) "I take what I have to to controll my constant pain" (05/15/2013)    Home Medications Prior to Admission medications   Medication Sig Start Date End Date Taking? Authorizing Provider  acetaminophen (OFIRMEV) 10 MG/ML SOLN Take by mouth. 05/27/20 05/26/21  [provider]  acetaminophen (TYLENOL) 500 MG tablet Take 1 tablet (500 mg total) by mouth every 8 (eight) hours as needed for mild pain or fever. 6 times a day, 500 mg, 12 to 24 tabs a day, 6,000 to 12,000 mg daily for pain Patient taking differently: Take 500-1,000 mg by mouth every 8 (eight) hours as needed for mild pain or fever. 08/05/16   Molt, Bethany, DO  albuterol (VENTOLIN HFA) 108 (90 Base)  MCG/ACT inhaler Inhale 3-6 puffs into the lungs every 6 (six) hours as needed for wheezing or shortness of breath.     [provider]  amiodarone (PACERONE) 200 MG tablet TAKE 1 TABLET (200 MG TOTAL) BY MOUTH TWO TIMES DAILY FOR 7 DAYS. THEN TAKE 1 TABLET DAILY 04/01/20 04/01/21  Gifford Shave, MD  apixaban (ELIQUIS) 2.5 MG TABS tablet Take 1 tablet (2.5 mg total) by mouth 2 (two) times daily. 04/06/20   Zenia Resides, MD  B Complex-C-Zn-Folic Acid (DIALYVITE/ZINC) TABS Take 1 tablet by mouth daily.  09/11/19   [provider]  bismuth subsalicylate (PEPTO BISMOL) 262 MG/15ML suspension Take 30 mLs by mouth every 6 (six) hours as needed for indigestion or diarrhea or loose stools.    [provider]  carvedilol (COREG) 3.125 MG tablet Take 1 tablet (3.125 mg total) by mouth 2 (two) times daily with a meal. 05/26/20   Kathlen Mody, Nicki Reaper T, PA-C  diphenhydrAMINE (BENADRYL) 25 mg capsule Take 25 mg by  mouth every 6 (six) hours as needed for itching.    [provider]  DIPHENHYDRAMINE HCL PO Take by mouth. 06/03/20 06/02/21  [provider]  doxercalciferol (HECTOROL) 0.5 MCG capsule Doxercalciferol (Hectorol) 08/05/20 08/04/21  [provider]  famotidine (PEPCID) 20 MG tablet Take 20 mg by mouth 2 (two) times daily as needed for heartburn or indigestion.    [provider]  hydrALAZINE (APRESOLINE) 25 MG tablet Take 1 tablet (25 mg total) by mouth 3 (three) times daily. 05/26/20   Richardson Dopp T, PA-C  hydrOXYzine (ATARAX/VISTARIL) 25 MG tablet Take 1 tablet (25 mg total) by mouth 3 (three) times daily as needed for itching. 04/01/20   Ezequiel Essex, MD  Methoxy PEG-Epoetin Beta (MIRCERA IJ) Mircera 06/15/20 06/14/21  [provider]  Nutritional Supplements (FEEDING SUPPLEMENT, NEPRO CARB STEADY,) LIQD Take 237 mLs by mouth 3 (three) times daily as needed (Supplement). Patient taking differently: Take 237 mLs by mouth 3 (three) times  a week. 05/17/13   Mikhail, Velta Addison, DO  pantoprazole (PROTONIX) 40 MG tablet TAKE 1 TABLET (40 MG TOTAL) BY MOUTH DAILY. 04/01/20 04/01/21  Ezequiel Essex, MD  sevelamer carbonate (RENVELA) 800 MG tablet Take 2 tablets (1,600 mg total) by mouth 3 (three) times daily with meals. Patient taking differently: Take 800-2,400 mg by mouth See admin instructions. Take 2,400 mg by mouth three times a day with meals and 800-1,600 mg with each snack 01/25/14   McLean-Scocuzza, Nino Glow, MD    Allergies    Patient has no known allergies.  Review of Systems   Review of Systems  Cardiovascular:  Positive for palpitations.  All other systems reviewed and are negative.  Physical Exam Updated Vital Signs BP 120/90   Pulse 62   Temp 97.8 F (36.6 C) (Oral)   Resp (!) 28   SpO2 100%   Physical Exam Vitals and nursing note reviewed.  Constitutional:      Comments: Chronically ill   HENT:     Head: Normocephalic.     Nose: Nose normal.     Mouth/Throat:     Mouth: Mucous membranes are moist.  Eyes:     Extraocular Movements: Extraocular movements intact.     Pupils: Pupils are equal, round, and reactive to light.  Cardiovascular:     Rate and Rhythm: Tachycardia present. Rhythm irregular.     Pulses: Normal pulses.     Heart sounds: Normal heart sounds.  Pulmonary:     Effort: Pulmonary effort is normal.     Comments: Diminished bilateral bases  Abdominal:     General: Abdomen is flat.     Palpations: Abdomen is soft.  Musculoskeletal:        General: Normal range of motion.     Cervical back: Normal range of motion and neck supple.  Skin:    General: Skin is warm.     Capillary Refill: Capillary refill takes less than 2 seconds.  Neurological:     General: No focal deficit present.     Mental Status: He is oriented to person, place, and time.  Psychiatric:        Mood and Affect: Mood normal.        Behavior: Behavior normal.    ED Results / Procedures / Treatments   Labs (all  labs ordered are listed, but only abnormal results are displayed) Labs Reviewed  COMPREHENSIVE METABOLIC PANEL - Abnormal; Notable for the following components:      Result Value  Potassium 3.4 (*)    Chloride 92 (*)    BUN 34 (*)    Creatinine, Ser 8.66 (*)    AST 12 (*)    GFR, Estimated 7 (*)    All other components within normal limits  CBC WITH DIFFERENTIAL/PLATELET - Abnormal; Notable for the following components:   RBC 3.72 (*)    Hemoglobin 11.8 (*)    HCT 36.9 (*)    RDW 15.8 (*)    All other components within normal limits  TROPONIN I (HIGH SENSITIVITY) - Abnormal; Notable for the following components:   Troponin I (High Sensitivity) 99 (*)    All other components within normal limits  MAGNESIUM    EKG EKG Interpretation  Date/Time:  Friday December 18 2020 14:34:20 EDT Ventricular Rate:  132 PR Interval:    QRS Duration: 92 QT Interval:  282 QTC Calculation: 417 R Axis:   -80 Text Interpretation: Atrial fibrillation with rapid ventricular response Left axis deviation Minimal voltage criteria for LVH, may be normal variant ( Cornell product ) Inferior infarct , age undetermined Abnormal ECG Since last tracing rate faster Confirmed by Wandra Arthurs [id number]) on 12/18/2020 6:58:26 PM  Radiology DG Chest 2 View  Result Date: 12/18/2020 CLINICAL DATA:  Chest pain and palpitations. EXAM: CHEST - 2 VIEW COMPARISON:  Multiple prior studies.  The most recent is 03/29/2020 FINDINGS: The cardiac silhouette, mediastinal and hilar contours are stable. Prominent pulmonary hila and calcifications likely related to remote granulomatous disease. Stable scattered bilateral calcified granulomas. Chronic reticulonodular interstitial pattern in the lungs likely process such as respiratory bronchiolitis. No focal infiltrates, superimposed pulmonary edema, pleural effusions or pneumothorax. The bony thorax is intact. IMPRESSION: Stable chronic lung changes. No acute pulmonary findings.  Electronically Signed   By: Marijo Sanes M.D.   On: 12/18/2020 15:31    Procedures Procedures   CRITICAL CARE Performed by: Wandra Arthurs   Total critical care time: 40 minutes  Critical care time was exclusive of separately billable procedures and treating other patients.  Critical care was necessary to treat or prevent imminent or life-threatening deterioration.  Critical care was time spent personally by me on the following activities: development of treatment plan with patient and/or surrogate as well as nursing, discussions with consultants, evaluation of patient's response to treatment, examination of patient, obtaining history from patient or surrogate, ordering and performing treatments and interventions, ordering and review of laboratory studies, ordering and review of radiographic studies, pulse oximetry and re-evaluation of patient's condition.   Medications Ordered in ED Medications  diltiazem (CARDIZEM) 125 mg in dextrose 5% 125 mL (1 mg/mL) infusion (has no administration in time range)  diltiazem (CARDIZEM) injection 10 mg (10 mg Intravenous Given 12/18/20 1914)    ED Course  I have reviewed the triage vital signs and the nursing notes.  Pertinent labs & imaging results that were available during my care of the patient were reviewed by me and considered in my medical decision making (see chart for details).    MDM Rules/Calculators/A&P                          Jeremy Mccoy is a 55 y.o. male presenting with palpitations and shortness of breath.  Patient is in rapid A. fib.  Reviewed records from November of last year.  Patient was admitted for 2 weeks and required cardioversion.  Patient compliant with his amiodarone but now with his Coreg and Eliquis.  His symptoms been going on for about a week or so.  Patient will likely need to be started back on blood thinners.  We will try Cardizem first and if not improved, may need amiodarone  8:19 PM Patient's heart rate is  still in the 120s.  I ordered amiodarone drip.  His electrolytes are baseline.  In particular his potassium is normal he just finished dialysis. I messaged Dr. Olga Millers from nephrology to see patient in AM. Will admit to stepdown for rapid afib on amiodarone drip. Pharmacy to dose heparin. I talked to Dr. Rudi Rummage from cardiology, who agreed with amiodarone drip. If HR still not controlled on the drip, then cardiology can be involved in the morning.    Final Clinical Impression(s) / ED Diagnoses Final diagnoses:  Palpitations    Rx / DC Orders ED Discharge Orders     None        Drenda Freeze, MD 12/18/20 2047

## 2020-12-18 NOTE — ED Triage Notes (Signed)
Pt here via [facility] with reports of palpitations for the last week intermittently.; pt has been off of eliquis and possibly coreg or amiodarone for approx 1 week.

## 2020-12-18 NOTE — ED Provider Notes (Signed)
Emergency Medicine Provider Triage Evaluation Note  Jeremy Mccoy , a 55 y.o. male  was evaluated in triage.  Pt complains of palpitations.  Patient has been present for the last week.  Has history of atrial fibrillation states that he is not normally in A. fib.  Has not been taking Eliquis for months due to mixup with his medications.  Patient also states that he has not been taking carvedilol.  Patient has been taking his amiodarone as prescribed.  Patient endorses intermittent chest pain, shortness of breath, lightheadedness, dizziness over the last week.  No chest pain or shortness of breath at present.  Patient is a dialysis patient receives dialysis Monday, Wednesday, and Friday.  States that last dialysis was last morning, received 3 out of 4 hours.  Review of Systems  Positive: Palpitations, chest pain, shortness of breath, lightheadedness, dizziness Negative: Abdominal pain, nausea, vomiting, diaphoresis, leg swelling  Physical Exam  BP 108/83   Pulse 76   Temp 97.8 F (36.6 C) (Oral)   Resp 14   SpO2 98%  Gen:   Awake, no distress   Resp:  Normal effort, lungs clear to auscultation bilaterally MSK:   Moves extremities without difficulty, no swelling or tenderness to bilateral lower extremities Other:  Tachycardic at rate of 132, heart rate irregularly irregular.  Abdomen soft, nondistended.  Fistula to left forearm with palpable thrill  Medical Decision Making  Medically screening exam initiated at 2:33 PM.  Appropriate orders placed.  [name] was informed that the remainder of the evaluation will be completed by another provider, this initial triage assessment does not replace that evaluation, and the importance of remaining in the ED until their evaluation is complete.  Patient is in A. fib with RVR, patient back to next available room.     Loni Beckwith, PA-C 12/18/20 1440    Malvin Johns, MD 12/19/20 [phone]

## 2020-12-18 NOTE — Progress Notes (Signed)
ANTICOAGULATION CONSULT NOTE - Initial Consult  Pharmacy Consult for heparin Indication: atrial fibrillation  No Known Allergies  Patient Measurements:   Heparin Dosing Weight: 71 kg   Vital Signs: Temp: 97.8 F (36.6 C) (08/05 1428) Temp Source: Oral (08/05 1428) BP: 124/88 (08/05 2015) Pulse Rate: 72 (08/05 2015)  Labs: Recent Labs    12/18/20 1441 12/18/20 1901  HGB 11.8*  --   HCT 36.9*  --   PLT 174  --   CREATININE 8.66*  --   TROPONINIHS  --  99*    CrCl cannot be calculated (Unknown ideal weight.).   Medical History: Past Medical History:  Diagnosis Date   Anemia    Anxiety    Arthritis    "qwhere" (05/15/2013)   CHF (congestive heart failure) (HCC)    COPD (chronic obstructive pulmonary disease) (HCC)    Crack cocaine use    Dental caries    Depression    ESRD (end stage renal disease) on dialysis ([place])    TTS: [address] [place] (05/15/2013)   Headache(784.0)    "get it from going to dialysis; when they get real bad I come to hospital" (05/15/2013)   Hematemesis/vomiting blood    History of blood transfusion 10/2011; 04/2013   HTN (hypertension) 06/12/2012   Seizures ([place])    Shortness of breath    "just when I have too much potassium is too high" (05/15/2013)    Medications:  (Not in a hospital admission)   Assessment: 55 YOM with palpitation found to be in Afib. Per triage note, patient has not been taking Eliquis for months due to a mixup with his medications. Pharmacy consulted to start IV heparin for AFib.   H/H slightly low. Plt wnl. Of note, patient has a h/o of ESRD on HD   Goal of Therapy:  Heparin level 0.3-0.7 units/ml Monitor platelets by anticoagulation protocol: Yes   Plan:  -Heparin 4000 units IV bolus followed by heparin infusion at 1050 units/hr  -F/u 8 hr HL and aPTT to confirm that they correlate  -Monitor daily HL, CBC and s/s of bleeding  Albertina Parr, PharmD., BCPS, BCCCP Clinical Pharmacist Please  refer to [facility] for unit-specific pharmacist

## 2020-12-18 NOTE — Consult Note (Addendum)
Cardiology Consultation:   Patient ID: HARVEST STANCO MRN: [id number]; DOB: 1966/05/01  Admit date: 12/18/2020 Date of Consult: 12/18/2020  PCP:  Patient, No Pcp Per (Inactive)   [facility] Providers Cardiologist:  Fransico Him, MD  Cardiology APP:  Liliane Shi, PA-C       Patient Profile:   Jeremy Mccoy is a 55 y.o. male with a hx of ESRD on HD, HTN, COPD/tobacco use, anemia, GERD, prior h/o cocaine use, Aflutter s/p TEE DCCV (03/2020) who is being seen 12/18/2020 for the evaluation of afib RVR at the request of Dr Gwendlyn Deutscher.  History of Present Illness:   Jeremy Mccoy is a 55 y.o. male with a hx of ESRD on HD (MWF), HTN, COPD/tobacco use, anemia, GERD, prior h/o cocaine use, Aflutter s/p TEE DCCV (03/2020) who is being seen 12/18/2020 for the evaluation of afib RVR at the request of Dr Gwendlyn Deutscher.  The patient reports that he has been having on and off palpitations since last week. He has not been taking some of his meds as he ran out (eliquis and coreg) for a few months now. His HD was on MWF and each HD time he was having chest pain, on and off palpitations. Sharp pain- lasting for 1 hour, resolves on its own, non exertional. With palpitations he also reported sob, dizziness over last week. He also has numbness/tingling in the toes.  Currently Denies any chest pain, orthopnea, pnd, LE edema, syncope.  Smokes 1pack/day, denies any other drug use  ER work up: EKG showed afib RVR. Initially was stared on IV diltiazem bolus plus gtt, the ER told me that he took his eliquis and he is already on amiodarone, since HR is not controlled, if ok to put him on amiodarone gtt.  Trop 99/96 BP 122/104 mmhg HR 118 bpm  Past Medical History:  Diagnosis Date   Anemia    Anxiety    Arthritis    "qwhere" (05/15/2013)   CHF (congestive heart failure) (HCC)    COPD (chronic obstructive pulmonary disease) (HCC)    Crack cocaine use    Dental caries    Depression    ESRD (end stage renal  disease) on dialysis ([place])    TTS: [address] [place] (05/15/2013)   Headache(784.0)    "get it from going to dialysis; when they get real bad I come to hospital" (05/15/2013)   Hematemesis/vomiting blood    History of blood transfusion 10/2011; 04/2013   HTN (hypertension) 06/12/2012   Seizures ([place])    Shortness of breath    "just when I have too much potassium is too high" (05/15/2013)    Past Surgical History:  Procedure Laterality Date   AV FISTULA PLACEMENT  11/29/2011   Procedure: ARTERIOVENOUS (AV) FISTULA CREATION;  Surgeon: Angelia Mould, MD;  Location: [place];  Service: Vascular;  Laterality: Left;   CARDIOVERSION N/A 03/31/2020   Procedure: CARDIOVERSION;  Surgeon: Lelon Perla, MD;  Location: [facility] ENDOSCOPY;  Service: Cardiovascular;  Laterality: N/A;   ESOPHAGOGASTRODUODENOSCOPY N/A 05/17/2013   Procedure: ESOPHAGOGASTRODUODENOSCOPY (EGD);  Surgeon: Beryle Beams, MD;  Location: [facility] ENDOSCOPY;  Service: Endoscopy;  Laterality: N/A;   INGUINAL HERNIA REPAIR Bilateral 1967   IR FLUORO GUIDE CV LINE RIGHT  10/20/2017   IR THROMBECTOMY AV FISTULA W/THROMBOLYSIS/PTA INC/SHUNT/IMG LEFT Left 10/24/2017   IR US GUIDE VASC ACCESS LEFT  10/24/2017   IR US GUIDE VASC ACCESS RIGHT  10/20/2017   RENAL BIOPSY  11/07/2011   TEE  WITHOUT CARDIOVERSION N/A 03/31/2020   Procedure: TRANSESOPHAGEAL ECHOCARDIOGRAM (TEE);  Surgeon: Lelon Perla, MD;  Location: [facility] ENDOSCOPY;  Service: Cardiovascular;  Laterality: N/A;     Home Medications:  Prior to Admission medications   Medication Sig Start Date End Date Taking? Authorizing Provider  acetaminophen (TYLENOL) 500 MG tablet Take 1 tablet (500 mg total) by mouth every 8 (eight) hours as needed for mild pain or fever. 6 times a day, 500 mg, 12 to 24 tabs a day, 6,000 to 12,000 mg daily for pain Patient taking differently: Take 500-1,000 mg by mouth every 8 (eight) hours as needed for mild pain or fever. 08/05/16  Yes Molt, Bethany, DO   albuterol (VENTOLIN HFA) 108 (90 Base) MCG/ACT inhaler Inhale 3-6 puffs into the lungs every 6 (six) hours as needed for wheezing or shortness of breath.    Yes [provider]  bismuth subsalicylate (PEPTO BISMOL) 262 MG/15ML suspension Take 30 mLs by mouth every 6 (six) hours as needed for indigestion or diarrhea or loose stools.   Yes [provider]  diphenhydrAMINE (BENADRYL) 25 mg capsule Take 25 mg by mouth every 6 (six) hours as needed for itching.   Yes [provider]  doxercalciferol (HECTOROL) 0.5 MCG capsule Doxercalciferol (Hectorol) 08/05/20 08/04/21 Yes [provider]  famotidine (PEPCID) 20 MG tablet Take 20 mg by mouth 2 (two) times daily as needed for heartburn or indigestion.   Yes [provider]  hydrOXYzine (ATARAX/VISTARIL) 25 MG tablet Take 1 tablet (25 mg total) by mouth 3 (three) times daily as needed for itching. 04/01/20  Yes Ezequiel Essex, MD  Methoxy PEG-Epoetin Beta (MIRCERA IJ) Mircera 06/15/20 06/14/21 Yes [provider]  pantoprazole (PROTONIX) 40 MG tablet TAKE 1 TABLET (40 MG TOTAL) BY MOUTH DAILY. Patient taking differently: Take 40 mg by mouth daily. 04/01/20 04/01/21 Yes Ezequiel Essex, MD  sevelamer carbonate (RENVELA) 800 MG tablet Take 2 tablets (1,600 mg total) by mouth 3 (three) times daily with meals. Patient taking differently: Take 800-2,400 mg by mouth See admin instructions. Take 2,400 mg by mouth three times a day with meals and 800-1,600 mg with each snack 01/25/14  Yes McLean-Scocuzza, Nino Glow, MD  acetaminophen (OFIRMEV) 10 MG/ML SOLN Take by mouth. Patient not taking: Reported on 12/18/2020 05/27/20 05/26/21  [provider]  amiodarone (PACERONE) 200 MG tablet TAKE 1 TABLET (200 MG TOTAL) BY MOUTH TWO TIMES DAILY FOR 7 DAYS. THEN TAKE 1 TABLET DAILY Patient not taking: No sig reported 04/01/20 04/01/21  Gifford Shave, MD  apixaban (ELIQUIS) 2.5 MG TABS tablet Take 1 tablet (2.5 mg  total) by mouth 2 (two) times daily. Patient not taking: No sig reported 04/06/20   Zenia Resides, MD  B Complex-C-Zn-Folic Acid (DIALYVITE/ZINC) TABS Take 1 tablet by mouth daily.  Patient not taking: Reported on 12/18/2020 09/11/19   [provider]  carvedilol (COREG) 3.125 MG tablet Take 1 tablet (3.125 mg total) by mouth 2 (two) times daily with a meal. Patient not taking: Reported on 12/18/2020 05/26/20   Richardson Dopp T, PA-C  hydrALAZINE (APRESOLINE) 25 MG tablet Take 1 tablet (25 mg total) by mouth 3 (three) times daily. Patient not taking: No sig reported 05/26/20   Richardson Dopp T, PA-C  Nutritional Supplements (FEEDING SUPPLEMENT, NEPRO CARB STEADY,) LIQD Take 237 mLs by mouth 3 (three) times daily as needed (Supplement). Patient not taking: No sig reported 05/17/13   Cristal Ford, DO    Inpatient Medications: Scheduled Meds:  metoprolol  tartrate  25 mg Oral Q6H   [START ON 12/19/2020] multivitamin  1 tablet Oral Daily   [START ON 12/19/2020] nicotine  14 mg Transdermal Daily   [START ON 12/19/2020] pantoprazole  40 mg Oral Daily   [START ON 12/19/2020] sevelamer carbonate  1,600 mg Oral TID WC   Continuous Infusions:  heparin 1,050 Units/hr (12/18/20 2126)   PRN Meds: acetaminophen, albuterol, famotidine, hydrOXYzine  Allergies:   No Known Allergies  Social History:   Social History   Socioeconomic History   Marital status: Single    Spouse name: Not on file   Number of children: 0   Years of education: 9 th   Highest education level: Not on file  Occupational History   Occupation: Corporate treasurer    Comment: Disabled  Tobacco Use   Smoking status: Every Day    Packs/day: 1.00    Years: 50.00    Pack years: 50.00    Types: Cigars, Cigarettes   Smokeless tobacco: Never  Vaping Use   Vaping Use: Never used  Substance and Sexual Activity   Alcohol use: No    Comment: 05/15/2013 "aien't drank in ~ 25 yrs"   Drug use: Yes    Types: Marijuana, Cocaine     Comment: Precription drugs (e.g. percocet) "I take what I have to to controll my constant pain" (05/15/2013)   Sexual activity: Never  Other Topics Concern   Not on file  Social History Narrative   ** Merged History Encounter **   Patient lives at home with his parents and he is disabled.   Education 9th grade education   Right handed   Caffeine one cup daily       Social Determinants of Health   Financial Resource Strain: Not on file  Food Insecurity: Not on file  Transportation Needs: Not on file  Physical Activity: Not on file  Stress: Not on file  Social Connections: Not on file  Intimate Partner Violence: Not on file    Family History:    Family History  Problem Relation Age of Onset   Heart attack Father      ROS:  Please see the history of present illness.  As noted above All other ROS reviewed and negative.     Physical Exam/Data:   Vitals:   12/18/20 2015 12/18/20 2030 12/18/20 2045 12/18/20 2115  BP: 124/88 (!) 108/91 (!) 120/96 (!) 121/97  Pulse: 72 60 85 (!) 124  Resp: (!) 21 (!) 24 13 (!) 27  Temp:      TempSrc:      SpO2: 98% 98% 98% 97%   No intake or output data in the 24 hours ending 12/18/20 2309 Last 3 Weights 08/26/2020 05/26/2020 04/06/2020  Weight (lbs) 158 lb 12.8 oz 153 lb 156 lb 6.4 oz  Weight (kg) 72.031 kg 69.4 kg 70.943 kg  Some encounter information is confidential and restricted. Go to Review Flowsheets activity to see all data.     There is no height or weight on file to calculate BMI.  General:  Well nourished, well developed, in no acute distress HEENT: normal Lymph: no adenopathy Neck: no JVD Endocrine:  No thryomegaly Vascular: No carotid bruits; FA pulses 2+ bilaterally without bruits  Cardiac:  irregularly irregular, no murmurs Lungs:  clear to auscultation bilaterally, no wheezing, rhonchi or rales  Abd: soft, nontender, no hepatomegaly  Ext: no edema Musculoskeletal:  No deformities, BUE and BLE strength normal and  equal Skin: warm and dry  Neuro:  CNs 2-12 intact, no focal abnormalities noted Psych:  Normal affect     Laboratory Data:  High Sensitivity Troponin:   Recent Labs  Lab 12/18/20 1901 12/18/20 2101  TROPONINIHS 99* 96*     Chemistry Recent Labs  Lab 12/18/20 1441  NA 136  K 3.4*  CL 92*  CO2 30  GLUCOSE 97  BUN 34*  CREATININE 8.66*  CALCIUM 9.7  GFRNONAA 7*  ANIONGAP 14    Recent Labs  Lab 12/18/20 1441  PROT 7.4  ALBUMIN 3.5  AST 12*  ALT 13  ALKPHOS 89  BILITOT 0.4   Hematology Recent Labs  Lab 12/18/20 1441  WBC 4.9  RBC 3.72*  HGB 11.8*  HCT 36.9*  MCV 99.2  MCH 31.7  MCHC 32.0  RDW 15.8*  PLT 174   BNPNo results for input(s): BNP, PROBNP in the last 168 hours.  DDimer No results for input(s): DDIMER in the last 168 hours.   Radiology/Studies:  DG Chest 2 View  Result Date: 12/18/2020 CLINICAL DATA:  Chest pain and palpitations. EXAM: CHEST - 2 VIEW COMPARISON:  Multiple prior studies.  The most recent is 03/29/2020 FINDINGS: The cardiac silhouette, mediastinal and hilar contours are stable. Prominent pulmonary hila and calcifications likely related to remote granulomatous disease. Stable scattered bilateral calcified granulomas. Chronic reticulonodular interstitial pattern in the lungs likely process such as respiratory bronchiolitis. No focal infiltrates, superimposed pulmonary edema, pleural effusions or pneumothorax. The bony thorax is intact. IMPRESSION: Stable chronic lung changes. No acute pulmonary findings. Electronically Signed   By: Marijo Sanes M.D.   On: 12/18/2020 15:31    Prior CV studies: Transesophageal echocardiogram 03/31/2020 EF 30-35, moderately reduced RVSF, severe LAE, moderate RAE, small pericardial effusion, moderate MR, mild MS, moderate TR, mild AI, grade 2 plaque involving descending aorta   Echocardiogram 01/23/2020 EF 50-55, no RWMA, mild LVH, normal RVSF, severe LAE, mild RAE, small effusion, mild MR, mod MS,  severe MAC, AV calcification, trivial AI     Echocardiogram 01/23/14 EF 50-55, mod LVH, Gr 1 DD, mild MR, mild LAE, small effusion   EKG: Afib with HR 135bpm  Assessment and Plan:   Paroxysmal Afib RVR  -h/o aflutter (11/21->s/p TEE DCCV)  - likely exacerbated due to medication non compliance, HTN 2. Cardiomyopathy (?tachy mediated): EF 30-35%, NYHA class II, stage C (chronic, not in exacerbation) 3. Chest pain- atypical (down trending troponins likely demand ischemia) 4. ESRD on HD 5. HTN 6. COPD/Tobacco use GERD H/o Cocaine use in the past.  Plan:--  - given the patient missed eliquis for some time (>1 week)- cannot do amiodarone, so please d/c IV or PO amiodarone. - continue metoprolol tart 17m q6hrs scheduled.  (Acceptable HR <120s) - Cannot Use Diltiazem (as his EF is low, and contraindicated)  - if he is tachycardic: short term option would be IV digoxin 5051m, followed by in 6 hours give 25086m  - started on heparin gtt, would continue that. Prior to discharge or earlier should transition to PO eliquis 5mg24mD  - keep him NPO after MN. Given symptomatic and prior low EF- he needs TEE DCCV (has not been on eliquis thus tee is warranted).  - atypical chest pain and downtrending trops. His low EF is likely tachy mediated, ischemic work up could be considered outpatient basis.  -obtain urine toxicology - troponin downtrending is from demand ischemia, trend trops - GDMT: once his HR is better, we can transition to Toprol xl 25mg64m, start  valsartan 80m BID.   - obtain ECHO in am - HD per nephrology - rest of the medications and mx per primary team.   Risk Assessment/Risk Scores:          CHA2DS2-VASc Score = 3  This indicates a 3.2% annual risk of stroke. The patient's score is based upon: CHF History: Yes HTN History: Yes Diabetes History: No Stroke History: No Vascular Disease History: Yes Age Score: 0 Gender Score: 0         For questions or updates,  please contact CEmeraldPlease consult [email or web address] for contact info under    Signed, RRenae Fickle MD  12/18/2020 11:09 PM

## 2020-12-18 NOTE — H&P (Addendum)
Jeremy Mccoy: [phone]  Patient name: Jeremy Mccoy Medical record number: [id number] Date of birth: 10-26-1965 Age: 55 y.o. Gender: male  Primary Care Provider: Patient, No Pcp Per (Inactive) Consultants: Nephrology, Cardiology Code Status: Full Preferred Emergency Contact: Jeremy Mccoy (sister) [phone]  Chief Complaint: Palpitations  Assessment and Plan: Jeremy Mccoy is a 55 y.o. male presenting with palpitations, found to be in A Fib with RVR. PMH is significant for A Fib previously on Coreg, Amiodarone, and Eliquis; HFrEF; COPD; ESRD on HD MWF, history of polysubstance abuse.    Palpitations  A Fib with RVR Patient with one week of intermittent palpitations in the setting of not taking his Coreg or Eliquis.  On arrival to the ED was found to be in A. fib with RVR with heart rate in the 130s.  His blood pressure was 108/83 and he was satting well on room air.  EKG showed A. fib RVR, left axis deviation, no ST elevation.  Troponins were elevated to 99 >96.  Chest x-ray stable from previous with no acute pulmonary findings. Last echo in November 2021 with LVEF 30-35%, moderately reduced RV SF, severe LAE, moderate RAE, small pericardial effusion, moderate MR, mild MS, moderate TR, mild AI, grade 2 plaque involving descending aorta. With his elevated troponins, considered ACS versus demand ischemia, with no ST changes on EKG, no chest pain at present, and downtrending of troponins, suspect most likely demand. He received 1 dose of diltiazem in the ED and his heart rate did not improve, he was then transitioned to a amiodarone drip with modest improvement in his heart rate. Given his history of polysubstance abuse, considered possible interaction between beta-blocker and cocaine.  However patient denies any recent drug use and does not make urine so cannot obtain UDS. -Admit to progressive, Dr. Gwendlyn Mccoy  attending -Discussed case with Dr. Rudi Mccoy, cardiology with the following recs:  -DC amiodarone  -Start metoprolol tartrate 25 mg every 6 hours  -Do not restart home Coreg  -N.p.o. at midnight for possible TEE cardioversion tomorrow  -If tachycardic can give IV digoxin 500 mcg, followed by 250 mcg 6 hours later -IV heparin for anticoagulation -Echocardiogram -TSH -PT/OT  Dysphagia, suspect oropharyngeal Intermittent.  Patient describes things as stuck in his throat. -SLP swallowing eval prior to diet order -If passes SLP eval, may benefit from outpatient ENT follow-up  ESRD on HD MWF  Dialyzed at his outpatient center today.  Nephrology consulted by ED. HD MWF.  Last dialysis today. Dry weight 72 kg. Left forearm AV fistula.  Patient is completely anuric. -Nephrology to see -Continue Renvela, continue Rena-Vite  HFrEF  Elevated Troponin Chest Pain (atypical) Likely experiencing demand ischemia as discussed above.  No evidence of volume overload on exam. Tropes downtrending as above.  Chest pain only with dialysis x2, self resolving. -Repeat echo in a.m. as above -Daily weights -Strict I's and O's  HTN Takes hydralazine 25 mg 3 times daily at home.  Blood pressures in the ED have been within normal limits. -Holding hydralazine for now as we are uptitrating beta-blocker as above  COPD  Tobacco Use Disorder 1ppd.  Not on a controller inhaler though states that he has been in the past.  Wheezing on my exam.  Per chart review does not appear that he has ever had formal PFTs, though he has been referred for them several times since 2015. -Nicotine patch 14 mg daily -We will start umeclidinium -We will  try to hold off on albuterol for now given his heart rate -Will need outpatient PFTs  Lower Extremity Tingling Describes tingling in his toes for the past week.  No weakness, burning.  No history of diabetes. -We will check B12, A1c  Normocytic Anemia Hemoglobin today 11.8.  MCV 99.2. No active bleeding.  Has history of anemia requiring blood transfusions but has refused endoscopy and colonoscopy in the past.  Previously admitted to [facility] with ?GI bleed in October 2021 but left AMA. -Daily CBC -Monitor for bleeding  FEN/GI: NPO Prophylaxis: Heparin  Disposition: Progressive  History of Present Illness:  Jeremy Mccoy is a 55 y.o. male presenting with palpitations.  Jeremy Mccoy reports that he is in palpitations exertional dyspnea for 1 week.  He noticed a change after stopping his Coreg and Eliquis about a week ago.  In this time he has also noticed transient chest pain "about halfway through dialysis" that is self resolving.  It is substernal, nonradiating, lasts about an hour, and resolves by the end of the dialysis session.  He denies any shortness of breath with these episodes. The only medications that he is taking regularly at this time are amiodarone and Pepcid. He also endorses dizziness and lightheadedness with activity for the last 1-2 days.  He has been able to continue working throughout the week but has been feeling "worse and worse and decided to come in."   Review Of Systems: Per HPI with the following additions:   Review of Systems  Respiratory:  Positive for chest tightness, shortness of breath and wheezing. Negative for cough.   Cardiovascular:  Positive for chest pain and palpitations. Negative for leg swelling.  Gastrointestinal:  Negative for abdominal distention and abdominal pain.  Neurological:  Positive for dizziness and light-headedness. Negative for weakness.    Patient Active Problem List   Diagnosis Date Noted   Nonspecific chest pain    Atrial fibrillation with RVR ([place]) 03/25/2020   Acute respiratory failure due to COVID-19 ([place]) 02/24/2020   Gastrointestinal hemorrhage, unspecified 02/24/2020   Allergy, unspecified, initial encounter 01/31/2020   Anaphylactic shock, unspecified, initial encounter 01/31/2020    Pleural effusion, not elsewhere classified 01/23/2020   Hyperkalemia, diminished renal excretion 01/20/2020   Plantar fasciitis, bilateral 09/12/2017   Headache, unspecified 05/24/2017   Fever, unspecified 10/05/2016   Hyperkalemia 02/23/2016   Vasculitis ([place])    Hemoptysis 07/25/2015   Encounter for removal of sutures 07/09/2015   Dependence on renal dialysis ([place]) 08/01/2014   Encounter for fitting and adjustment of extracorporeal dialysis catheter ([place]) 08/01/2014   Pruritus, unspecified 06/27/2014   Atrial fibrillation ([place]) 01/31/2014   Chronic diastolic congestive heart failure ([place]) 01/31/2014   COPD (chronic obstructive pulmonary disease) ([place]) 01/31/2014   Thrombocytopenia ([place]) 01/23/2014   Unspecified constipation 01/23/2014   High anion gap metabolic acidosis 0000000   ESRD on hemodialysis ([place]) 11/05/2013   Seizure disorder ([place]) 11/04/2013   Protein-calorie malnutrition, severe ([place]) 07/12/2013   Respiratory failure ([place]) 07/10/2013   Status epilepticus ([place]) 07/10/2013   Essential hypertension 07/10/2013   Hematemesis 05/15/2013   Pruritus ani 04/27/2013   Other specified symptoms and signs involving the circulatory and respiratory systems 04/22/2013   Abnormal EKG 09/20/2012   Drug abuse ([place]) 09/20/2012   Elevated troponin 09/20/2012   Anemia in chronic kidney disease 09/20/2012   Cocaine abuse ([place]) 06/12/2012   End stage renal disease ([place]) 01/11/2012   Iron deficiency anemia, unspecified 12/02/2011   Secondary hyperparathyroidism of renal origin (  [place]) 12/02/2011   Tobacco abuse 11/04/2011   Dental caries 11/04/2011   Color blindness 11/04/2011    Past Medical History: Past Medical History:  Diagnosis Date   Anemia    Anxiety    Arthritis    "qwhere" (05/15/2013)   CHF (congestive heart failure) (HCC)    COPD (chronic obstructive pulmonary disease) (HCC)    Crack cocaine use    Dental caries    Depression    ESRD (end stage renal disease) on  dialysis ([place])    TTS: [address] [place] (05/15/2013)   Headache(784.0)    "get it from going to dialysis; when they get real bad I come to hospital" (05/15/2013)   Hematemesis/vomiting blood    History of blood transfusion 10/2011; 04/2013   HTN (hypertension) 06/12/2012   Seizures ([place])    Shortness of breath    "just when I have too much potassium is too high" (05/15/2013)    Past Surgical History: Past Surgical History:  Procedure Laterality Date   AV FISTULA PLACEMENT  11/29/2011   Procedure: ARTERIOVENOUS (AV) FISTULA CREATION;  Surgeon: Angelia Mould, MD;  Location: [place];  Service: Vascular;  Laterality: Left;   CARDIOVERSION N/A 03/31/2020   Procedure: CARDIOVERSION;  Surgeon: Lelon Perla, MD;  Location: [facility] ENDOSCOPY;  Service: Cardiovascular;  Laterality: N/A;   ESOPHAGOGASTRODUODENOSCOPY N/A 05/17/2013   Procedure: ESOPHAGOGASTRODUODENOSCOPY (EGD);  Surgeon: Beryle Beams, MD;  Location[facility] ENDOSCOPY;  Service: Endoscopy;  Laterality: N/A;   INGUINAL HERNIA REPAIR Bilateral 1967   IR FLUORO GUIDE CV LINE RIGHT  10/20/2017   IR THROMBECTOMY AV FISTULA W/THROMBOLYSIS/PTA INC/SHUNT/IMG LEFT Left 10/24/2017   IR US GUIDE VASC ACCESS LEFT  10/24/2017   IR US GUIDE VASC ACCESS RIGHT  10/20/2017   RENAL BIOPSY  11/07/2011   TEE WITHOUT CARDIOVERSION N/A 03/31/2020   Procedure: TRANSESOPHAGEAL ECHOCARDIOGRAM (TEE);  Surgeon: Lelon Perla, MD;  Location: [facility] ENDOSCOPY;  Service: Cardiovascular;  Laterality: N/A;    Social History: Social History   Tobacco Use   Smoking status: Every Day    Packs/day: 1.00    Years: 50.00    Pack years: 50.00    Types: Cigars, Cigarettes   Smokeless tobacco: Never  Vaping Use   Vaping Use: Never used  Substance Use Topics   Alcohol use: No    Comment: 05/15/2013 "aien't drank in ~ 25 yrs"   Drug use: Yes    Types: Marijuana, Cocaine    Comment: Precription drugs (e.g. percocet) "I take what I have to to controll my constant  pain" (05/15/2013)   No current alcohol or drug use.  Currently smokes 1 PPD.  Family History: Family History  Problem Relation Age of Onset   Heart attack Father    Allergies and Medications: No Known Allergies No current facility-administered medications on file prior to encounter.   Current Outpatient Medications on File Prior to Encounter  Medication Sig Dispense Refill   acetaminophen (TYLENOL) 500 MG tablet Take 1 tablet (500 mg total) by mouth every 8 (eight) hours as needed for mild pain or fever. 6 times a day, 500 mg, 12 to 24 tabs a day, 6,000 to 12,000 mg daily for pain (Patient taking differently: Take 500-1,000 mg by mouth every 8 (eight) hours as needed for mild pain or fever.) 30 tablet 0   albuterol (VENTOLIN HFA) 108 (90 Base) MCG/ACT inhaler Inhale 3-6 puffs into the lungs every 6 (six) hours as needed for wheezing or shortness of breath.  bismuth subsalicylate (PEPTO BISMOL) 262 MG/15ML suspension Take 30 mLs by mouth every 6 (six) hours as needed for indigestion or diarrhea or loose stools.     diphenhydrAMINE (BENADRYL) 25 mg capsule Take 25 mg by mouth every 6 (six) hours as needed for itching.     doxercalciferol (HECTOROL) 0.5 MCG capsule Doxercalciferol (Hectorol)     famotidine (PEPCID) 20 MG tablet Take 20 mg by mouth 2 (two) times daily as needed for heartburn or indigestion.     hydrOXYzine (ATARAX/VISTARIL) 25 MG tablet Take 1 tablet (25 mg total) by mouth 3 (three) times daily as needed for itching. 90 tablet 0   Methoxy PEG-Epoetin Beta (MIRCERA IJ) Mircera     pantoprazole (PROTONIX) 40 MG tablet TAKE 1 TABLET (40 MG TOTAL) BY MOUTH DAILY. (Patient taking differently: Take 40 mg by mouth daily.) 30 tablet 0   sevelamer carbonate (RENVELA) 800 MG tablet Take 2 tablets (1,600 mg total) by mouth 3 (three) times daily with meals. (Patient taking differently: Take 800-2,400 mg by mouth See admin instructions. Take 2,400 mg by mouth three times a day with meals  and 800-1,600 mg with each snack) 90 tablet 0   acetaminophen (OFIRMEV) 10 MG/ML SOLN Take by mouth. (Patient not taking: Reported on 12/18/2020)     amiodarone (PACERONE) 200 MG tablet TAKE 1 TABLET (200 MG TOTAL) BY MOUTH TWO TIMES DAILY FOR 7 DAYS. THEN TAKE 1 TABLET DAILY (Patient not taking: No sig reported) 37 tablet 0   apixaban (ELIQUIS) 2.5 MG TABS tablet Take 1 tablet (2.5 mg total) by mouth 2 (two) times daily. (Patient not taking: No sig reported) 60 tablet 12   B Complex-C-Zn-Folic Acid (DIALYVITE/ZINC) TABS Take 1 tablet by mouth daily.  (Patient not taking: Reported on 12/18/2020)     carvedilol (COREG) 3.125 MG tablet Take 1 tablet (3.125 mg total) by mouth 2 (two) times daily with a meal. (Patient not taking: Reported on 12/18/2020) 180 tablet 1   hydrALAZINE (APRESOLINE) 25 MG tablet Take 1 tablet (25 mg total) by mouth 3 (three) times daily. (Patient not taking: No sig reported) 270 tablet 3   Nutritional Supplements (FEEDING SUPPLEMENT, NEPRO CARB STEADY,) LIQD Take 237 mLs by mouth 3 (three) times daily as needed (Supplement). (Patient not taking: No sig reported)  0    Objective: BP (!) 121/97   Pulse (!) 124   Temp 97.8 F (36.6 C) (Oral)   Resp (!) 27   SpO2 97%  Exam: General: Comfortable, appears stated age, NAD Eyes: Sclerae anicteric, EOM's intact ENTM: Without rhinorrhea or congestion Neck: No JVD Cardiovascular: Irregularly irregular, transmitted fistula sounds Respiratory: Good air movement, no rhonchi or rales, diffuse end expiratory wheeze Gastrointestinal: Soft, nontender, nondistended MSK: Without edema, no deformity Derm: Without rash or excoriation Neuro: Alert and oriented, no focal deficits Psych: Mood and affect appropriate  Labs and Imaging: CBC BMET  Recent Labs  Lab 12/18/20 1441  WBC 4.9  HGB 11.8*  HCT 36.9*  PLT 174   Recent Labs  Lab 12/18/20 1441  NA 136  K 3.4*  CL 92*  CO2 30  BUN 34*  CREATININE 8.66*  GLUCOSE 97  CALCIUM  9.7     EKG: A. fib with RVR, left axis deviation, no ST elevation  CXR IMPRESSION: Stable chronic lung changes. No acute pulmonary findings.  Eppie Gibson, MD 12/18/2020, 11:07 PM PGY-1, [place] Intern Mccoy: [phone], text pages welcome  [place] Upper-Level Resident Addendum  I have independently interviewed and examined the patient. I have discussed the above with the original author and agree with their documentation. Please see also any attending notes.    Carollee Leitz, MD PGY-3, [facility] 12/19/2020 9:05 AM  FPTS Service Mccoy: [phone] (text pages welcome through [facility])

## 2020-12-19 ENCOUNTER — Inpatient Hospital Stay (HOSPITAL_BASED_OUTPATIENT_CLINIC_OR_DEPARTMENT_OTHER): Payer: Medicare HMO

## 2020-12-19 DIAGNOSIS — I509 Heart failure, unspecified: Secondary | ICD-10-CM | POA: Diagnosis not present

## 2020-12-19 DIAGNOSIS — I480 Paroxysmal atrial fibrillation: Secondary | ICD-10-CM

## 2020-12-19 DIAGNOSIS — Z8616 Personal history of COVID-19: Secondary | ICD-10-CM | POA: Diagnosis not present

## 2020-12-19 DIAGNOSIS — J449 Chronic obstructive pulmonary disease, unspecified: Secondary | ICD-10-CM | POA: Diagnosis not present

## 2020-12-19 DIAGNOSIS — N186 End stage renal disease: Secondary | ICD-10-CM

## 2020-12-19 DIAGNOSIS — R1310 Dysphagia, unspecified: Secondary | ICD-10-CM

## 2020-12-19 DIAGNOSIS — I4891 Unspecified atrial fibrillation: Secondary | ICD-10-CM | POA: Diagnosis not present

## 2020-12-19 DIAGNOSIS — R7989 Other specified abnormal findings of blood chemistry: Secondary | ICD-10-CM | POA: Diagnosis not present

## 2020-12-19 DIAGNOSIS — R0602 Shortness of breath: Secondary | ICD-10-CM | POA: Diagnosis not present

## 2020-12-19 DIAGNOSIS — R002 Palpitations: Secondary | ICD-10-CM

## 2020-12-19 DIAGNOSIS — Z20822 Contact with and (suspected) exposure to covid-19: Secondary | ICD-10-CM | POA: Diagnosis not present

## 2020-12-19 LAB — ECHOCARDIOGRAM COMPLETE
AR max vel: 2.8 cm2
AV Area VTI: 2.88 cm2
AV Area mean vel: 2.79 cm2
AV Mean grad: 3 mmHg
AV Peak grad: 6.3 mmHg
Ao pk vel: 1.25 m/s
Area-P 1/2: 3.63 cm2
Calc EF: 55.8 %
Height: 72 in
MV VTI: 0.96 cm2
S' Lateral: 3.3 cm
Single Plane A2C EF: 51.6 %
Single Plane A4C EF: 58.6 %
Weight: 2613.77 oz

## 2020-12-19 LAB — PHOSPHORUS: Phosphorus: 7.4 mg/dL — ABNORMAL HIGH (ref 2.5–4.6)

## 2020-12-19 LAB — LIPID PANEL
Cholesterol: 76 mg/dL (ref 0–200)
HDL: 34 mg/dL — ABNORMAL LOW (ref >40)
LDL Cholesterol: 27 mg/dL (ref 0–99)
Total CHOL/HDL Ratio: 2.2 RATIO
Triglycerides: 76 mg/dL (ref <150)
VLDL: 15 mg/dL (ref 0–40)

## 2020-12-19 LAB — RENAL FUNCTION PANEL
Albumin: 3.1 g/dL — ABNORMAL LOW (ref 3.5–5.0)
Anion gap: 14 (ref 5–15)
BUN: 49 mg/dL — ABNORMAL HIGH (ref 6–20)
CO2: 26 mmol/L (ref 22–32)
Calcium: 9.1 mg/dL (ref 8.9–10.3)
Chloride: 92 mmol/L — ABNORMAL LOW (ref 98–111)
Creatinine, Ser: 10.58 mg/dL — ABNORMAL HIGH (ref 0.61–1.24)
GFR, Estimated: 5 mL/min — ABNORMAL LOW (ref >60)
Glucose, Bld: 77 mg/dL (ref 70–99)
Phosphorus: 7.5 mg/dL — ABNORMAL HIGH (ref 2.5–4.6)
Potassium: 4.2 mmol/L (ref 3.5–5.1)
Sodium: 132 mmol/L — ABNORMAL LOW (ref 135–145)

## 2020-12-19 LAB — APTT: aPTT: 44 seconds — ABNORMAL HIGH (ref 24–36)

## 2020-12-19 LAB — HEPARIN LEVEL (UNFRACTIONATED): Heparin Unfractionated: <0.1 IU/mL — ABNORMAL LOW (ref 0.30–0.70)

## 2020-12-19 LAB — CBC
HCT: 32.8 % — ABNORMAL LOW (ref 39.0–52.0)
Hemoglobin: 10.4 g/dL — ABNORMAL LOW (ref 13.0–17.0)
MCH: 31.6 pg (ref 26.0–34.0)
MCHC: 31.7 g/dL (ref 30.0–36.0)
MCV: 99.7 fL (ref 80.0–100.0)
Platelets: 166 10*3/uL (ref 150–400)
RBC: 3.29 MIL/uL — ABNORMAL LOW (ref 4.22–5.81)
RDW: 15.9 % — ABNORMAL HIGH (ref 11.5–15.5)
WBC: 5 10*3/uL (ref 4.0–10.5)
nRBC: 0 % (ref 0.0–0.2)

## 2020-12-19 LAB — HEMOGLOBIN A1C
Hgb A1c MFr Bld: 5.3 % (ref 4.8–5.6)
Mean Plasma Glucose: 105.41 mg/dL

## 2020-12-19 LAB — SARS CORONAVIRUS 2 (TAT 6-24 HRS): SARS Coronavirus 2: NEGATIVE

## 2020-12-19 LAB — MRSA NEXT GEN BY PCR, NASAL: MRSA by PCR Next Gen: NOT DETECTED

## 2020-12-19 MED ORDER — CARVEDILOL 6.25 MG PO TABS
6.2500 mg | ORAL_TABLET | Freq: Two times a day (BID) | ORAL | Status: DC
  Administered 2020-12-19 – 2020-12-20 (×2): 6.25 mg via ORAL
  Filled 2020-12-19 (×3): qty 1

## 2020-12-19 MED ORDER — HEPARIN BOLUS VIA INFUSION
2000.0000 [IU] | Freq: Once | INTRAVENOUS | Status: AC
  Administered 2020-12-19: 2000 [IU] via INTRAVENOUS
  Filled 2020-12-19: qty 2000

## 2020-12-19 MED ORDER — UMECLIDINIUM BROMIDE 62.5 MCG/INH IN AEPB
1.0000 | INHALATION_SPRAY | Freq: Every day | RESPIRATORY_TRACT | Status: DC
  Administered 2020-12-20: 1 via RESPIRATORY_TRACT
  Filled 2020-12-19: qty 7

## 2020-12-19 MED ORDER — APIXABAN 5 MG PO TABS
5.0000 mg | ORAL_TABLET | Freq: Two times a day (BID) | ORAL | Status: DC
  Administered 2020-12-19 (×2): 5 mg via ORAL
  Filled 2020-12-19 (×2): qty 1

## 2020-12-19 NOTE — Evaluation (Signed)
Clinical/Bedside Swallow Evaluation Patient Details  Name: Jeremy Mccoy MRN: [id number] Date of Birth: [birth year]  Today's Date: 12/19/2020 Time: SLP Start Time (ACUTE ONLY): 1425 SLP Stop Time (ACUTE ONLY): 1449 SLP Time Calculation (min) (ACUTE ONLY): 24 min  Past Medical History:  Past Medical History:  Diagnosis Date   Anemia    Anxiety    Arthritis    "qwhere" (05/15/2013)   CHF (congestive heart failure) (HCC)    COPD (chronic obstructive pulmonary disease) (HCC)    Crack cocaine use    Dental caries    Depression    ESRD (end stage renal disease) on dialysis ([place])    TTS: [address] [place] (05/15/2013)   Headache(784.0)    "get it from going to dialysis; when they get real bad I come to hospital" (05/15/2013)   Hematemesis/vomiting blood    History of blood transfusion 10/2011; 04/2013   HTN (hypertension) 06/12/2012   Seizures ([place])    Shortness of breath    "just when I have too much potassium is too high" (05/15/2013)   Past Surgical History:  Past Surgical History:  Procedure Laterality Date   AV FISTULA PLACEMENT  11/29/2011   Procedure: ARTERIOVENOUS (AV) FISTULA CREATION;  Surgeon: Angelia Mould, MD;  Location: [place];  Service: Vascular;  Laterality: Left;   CARDIOVERSION N/A 03/31/2020   Procedure: CARDIOVERSION;  Surgeon: Lelon Perla, MD;  Location: [facility] ENDOSCOPY;  Service: Cardiovascular;  Laterality: N/A;   ESOPHAGOGASTRODUODENOSCOPY N/A 05/17/2013   Procedure: ESOPHAGOGASTRODUODENOSCOPY (EGD);  Surgeon: Beryle Beams, MD;  Location[facility] ENDOSCOPY;  Service: Endoscopy;  Laterality: N/A;   INGUINAL HERNIA REPAIR Bilateral 1967   IR FLUORO GUIDE CV LINE RIGHT  10/20/2017   IR THROMBECTOMY AV FISTULA W/THROMBOLYSIS/PTA INC/SHUNT/IMG LEFT Left 10/24/2017   IR US GUIDE VASC ACCESS LEFT  10/24/2017   IR US GUIDE VASC ACCESS RIGHT  10/20/2017   RENAL BIOPSY  11/07/2011   TEE WITHOUT CARDIOVERSION N/A 03/31/2020   Procedure: TRANSESOPHAGEAL  ECHOCARDIOGRAM (TEE);  Surgeon: Lelon Perla, MD;  Location: [facility] ENDOSCOPY;  Service: Cardiovascular;  Laterality: N/A;   HPI:  Pt admitted to [facility] with Afib.  He has h/o cocaine and current tobacco use, GERD, ESRD and is on dialysis.   He was possibly needing cardioversion but reverted back independently.  Swallow evaluation was ordered due to pt report of occasional deficits (few times over the last month) with dysphagia to food more than liquid.  He points to his throat to indicate area of stasis requiring him to expectorate food along with liquid that he consumed to faciliate clearance.  He reports she can swallow multiple pills at one time without defiits.  Pt has undergone esophagram in 02/2014 that showed "Smooth narrowing of the distal 8 cm the esophagus with moderate reflux. Suspect chronic esophagitis. No higher grade stricture seen.  No mass or ulceration seen and No hiatal hernia. Diminished primary peristalsis".  He also underwent endoscopy 2015 that showed esophagitis and a small hiatal hernia.   Assessment / Plan / Recommendation Clinical Impression  Functional oropharyngeal swallow ability based on clinical swallow evaluation.  Swallow is timely judged by observation of laryngeal elevation and there were no indications of pharyngeal stasis.     Pt passed 3 ounce Yale water screen easily. Pt consumed graham crackers, jello and water.  No oral retention nor indication of pharyngeal dysphagia.  Slight facial asymmetry on left and minimal palatal deviation to the right upon phonation noted.    Suspect  pt's symptoms is esophageal given inconsistent presentation and known h/o esophageal dysmotility/GERD.    Recommend regular/thin with general precautions including taking small bits/sips and drink liquids t/o meals.    Advised pt consider follow up with GI as an OP given h/o esophagitis and hiatal hernia per endoscopy in 2015.  SLP Visit Diagnosis: Dysphagia, unspecified (R13.10)     Aspiration Risk  Mild aspiration risk    Diet Recommendation Regular;Thin liquid   Liquid Administration via: Cup;Straw Medication Administration: Whole meds with liquid Supervision: Patient able to self feed Compensations: Slow rate;Small sips/bites Postural Changes: Seated upright at 90 degrees;Remain upright for at least 30 minutes after po intake    Other  Recommendations Oral Care Recommendations: Oral care BID   Follow up Recommendations None      Frequency and Duration   N/a         Prognosis   N/a     Swallow Study   General Date of Onset: 12/19/20 HPI: Pt admitted to [facility] with Afib.  He has h/o cocaine and current tobacco use, GERD, ESRD and is on dialysis.   He was possibly needing cardioversion but reverted back independently.  Swallow evaluation was ordered due to pt report of occasional deficits (few times over the last month) with dysphagia to food more than liquid.  He points to his throat to indicate area of stasis requiring him to expectorate food along with liquid that he consumed to faciliate clearance.  He reports she can swallow multiple pills at one time without defiits.  Pt has undergone esophagram in 02/2014 that showed "Smooth narrowing of the distal 8 cm the esophagus with moderate reflux. Suspect chronic esophagitis. No higher grade stricture seen.  No mass or ulceration seen and No hiatal hernia. Diminished primary peristalsis".  He also underwent endoscopy 2015 that showed esophagitis and a small hiatal hernia. Type of Study: Bedside Swallow Evaluation Diet Prior to this Study: Regular;Thin liquids Temperature Spikes Noted: No Respiratory Status: Room air History of Recent Intubation: No Behavior/Cognition: Alert;Cooperative;Pleasant mood Oral Cavity Assessment: Within Functional Limits Oral Care Completed by SLP: No Oral Cavity - Dentition: Adequate natural dentition Vision: Functional for self-feeding Self-Feeding Abilities: Able to feed  self Patient Positioning: Upright in bed Baseline Vocal Quality: Hoarse;Other (comment) (raspy consistent with tobacco usage) Volitional Cough: Strong Volitional Swallow: Able to elicit    Oral/Motor/Sensory Function Overall Oral Motor/Sensory Function: Mild impairment (minimal left facial asymmetry and slight deviaton to palate to the right upon phonation) Facial Symmetry: Abnormal symmetry left Velum: Impaired left   Ice Chips Ice chips: Not tested   Thin Liquid Thin Liquid: Within functional limits Other Comments: 3 ounce [place] water screen passed    Nectar Thick Nectar Thick Liquid: Not tested   Honey Thick Honey Thick Liquid: Not tested   Puree Puree: Within functional limits Presentation: Self Fed;Spoon   Solid     Solid: Within functional limits Presentation: Self Fed;Spoon      Macario Golds 12/19/2020,3:32 PM  Kathleen Lime, MS [facility] SLP [place] Office [phone] Pager [phone]

## 2020-12-19 NOTE — Progress Notes (Signed)
[place] for heparin Indication: atrial fibrillation  Labs: Recent Labs    12/18/20 1441 12/18/20 1901 12/18/20 2101 12/19/20 0459  HGB 11.8*  --   --  10.4*  HCT 36.9*  --   --  32.8*  PLT 174  --   --  166  APTT  --   --   --  44*  HEPARINUNFRC  --   --   --  <0.10*  CREATININE 8.66*  --   --   --   TROPONINIHS  --  99* 96*  --     Assessment: 55 YOM with palpitation found to be in Afib. Per triage note, patient has not been taking Eliquis for months due to a mixup with his medications. Pharmacy consulted to start IV heparin for AFib.   H/H slightly low. Plt wnl. Of note, patient has a h/o of ESRD on HD  Initial heparin level undetectable.    Goal of Therapy:  Heparin level 0.3-0.7 units/ml Monitor platelets by anticoagulation protocol: Yes   Plan:  -Heparin 2000 units IV bolus and increase heparin drip to 1250 units/hr  -F/u 6-8 hr HL -Monitor daily HL, CBC and s/s of bleeding  Thanks for allowing pharmacy to be a part of this patient's care.  Excell Seltzer, PharmD Clinical Pharmacist

## 2020-12-19 NOTE — Plan of Care (Signed)

## 2020-12-19 NOTE — Progress Notes (Addendum)
Family Medicine Teaching Service Daily Progress Note Intern Pager: [phone]  Patient name: Jeremy Mccoy Medical record number: [id number] Date of birth: April 08, 1966 Age: 55 y.o. Gender: male  Primary Care Provider: Patient, No Pcp Per (Inactive) Consultants: Nephrology, cardiology Code Status: Full  Pt Overview and Major Events to Date:  8/5: Admitted  Assessment and Plan:  Jeremy Mccoy is a 55 y.o. male presenting with palpitations, found to be in A Fib with RVR, now converted back to sinus rhythm PMH is significant for A Fib, HFrEF; COPD; ESRD on HD MWF, history of polysubstance abuse.    A. fib with RVR, resolved HR 77 overnight and in sinus rhythm on telemetry today.  Patient denies chest pain or shortness of breath this morning. Started on heparin yesterday which has now been discontinued. Echo on 8/6: EF 55 to 60%, dilated left ventricle -Cardiology following, appreciate recommendations -Continue Eliquis 5 mg twice daily -Continue Coreg 6.25 mg twice daily -Encourage compliance with above medications on discharge -PT OT -F/u TSH   ESRD on HD MWF, stable -Nephrology following, appreciate recommendations -RFP on days of HD  -Avoid nephrotoxic agents  HFrEF, stable Appears euvolemic on exam today -Cardiology following appreciate recommendations -Daily weights -Strict I's and O's  Elevated troponin, resolved Elevated troponins 99> 96 Likely secondary to ESRD plus A. fib with RVR.  Cardiology did not think this is related to acute ischemia.  -No need to continue to trend  Hypertension, stable BP 126/85 -Continue to monitor BP -Continue Coreg 6.25 mg twice daily  COPD, tobacco use disorder, stable -Continue Incruse Ellipta 1 puff daily  Normocytic anemia Hb 10 this morning, baseline 10-11.  No signs of bleeding this morning. -Continue to monitor with CBC   Dysphagia  globus sensation Patient denies symptoms this morning. -Continue PPI -Consider GI  outpatient follow-up  FEN/GI: Renal diet PPx: Protonix Dispo:Home today/tomorrow. Barriers include ongoing work-up with cardiology.   Subjective:   No acute concerns overnight.  Denies chest pain, dyspnea, palpitations or dizziness.  Would like to go home if possible today  Objective: Temp:  [96.5 F (35.8 C)-98.1 F (36.7 C)] 96.5 F (35.8 C) (08/07 0406) Pulse Rate:  [72-80] 77 (08/07 0406) Resp:  [14-20] 18 (08/07 0406) BP: (126-152)/(85-109) 152/109 (08/07 0406) SpO2:  [94 %-98 %] 96 % (08/07 0406) Weight:  [75.7 kg] 75.7 kg (08/07 0406)  Physical Exam: General: Alert, no acute distress Cardio: Normal S1 and S2, RRR, no r/m/g Pulm: CTAB, normal work of breathing Abdomen: Bowel sounds normal. Abdomen soft and non-tender.  Extremities: No peripheral edema.  Neuro: Cranial nerves grossly intact   Laboratory: Recent Labs  Lab 12/18/20 1441 12/19/20 0459 12/20/20 0013  WBC 4.9 5.0 4.6  HGB 11.8* 10.4* 10.0*  HCT 36.9* 32.8* 31.1*  PLT 174 166 165   Recent Labs  Lab 12/18/20 1441 12/19/20 0459  NA 136 132*  K 3.4* 4.2  CL 92* 92*  CO2 30 26  BUN 34* 49*  CREATININE 8.66* 10.58*  CALCIUM 9.7 9.1  PROT 7.4  --   BILITOT 0.4  --   ALKPHOS 89  --   ALT 13  --   AST 12*  --   GLUCOSE 97 77      Imaging/Diagnostic Tests: DG Chest 2 View  Result Date: 12/18/2020 CLINICAL DATA:  Chest pain and palpitations. EXAM: CHEST - 2 VIEW COMPARISON:  Multiple prior studies.  The most recent is 03/29/2020 FINDINGS: The cardiac silhouette, mediastinal and hilar contours are  stable. Prominent pulmonary hila and calcifications likely related to remote granulomatous disease. Stable scattered bilateral calcified granulomas. Chronic reticulonodular interstitial pattern in the lungs likely process such as respiratory bronchiolitis. No focal infiltrates, superimposed pulmonary edema, pleural effusions or pneumothorax. The bony thorax is intact. IMPRESSION: Stable chronic lung  changes. No acute pulmonary findings. Electronically Signed   By: Marijo Sanes M.D.   On: 12/18/2020 15:31   ECHOCARDIOGRAM COMPLETE  Result Date: 12/19/2020    ECHOCARDIOGRAM REPORT   Patient Name:   Jeremy Mccoy [facility] Date of Exam: 12/19/2020 Medical Rec #:  [id number]        Height:       72.0 in Accession #:    [id number]       Weight:       163.4 lb Date of Birth:  Mar 31, 1966        BSA:          1.955 m Patient Age:    30 years         BP:           149/109 mmHg Patient Gender: M                HR:           85 bpm. Exam Location:  Inpatient Procedure: 2D Echo, Cardiac Doppler, Color Doppler and 3D Echo Indications:    R06.02 SOB  History:        Patient has prior history of Echocardiogram examinations, most                 recent 03/31/2020. Arrythmias:Atrial Fibrillation and Atrial                 Flutter.  Sonographer:    Merrie Roof RDCS Referring Phys: [place]  1. Left ventricular ejection fraction, by estimation, is 55 to 60%. The left ventricle has normal function. The left ventricle has no regional wall motion abnormalities. The left ventricular internal cavity size was mildly to moderately dilated. There is mild left ventricular hypertrophy. Left ventricular diastolic parameters are indeterminate.  2. Right ventricular systolic function is mildly reduced. The right ventricular size is normal.  3. Left atrial size was severely dilated.  4. Right atrial size was moderately dilated.  5. Peak and mean gradients through the MV are 17 and 6 mm Hg respectively consistent with moderate mitral stenosis Compared to echo report from September 2021, no significant change . Mild mitral valve regurgitation. Severe mitral annular calcification.  6. Aortic valve regurgitation is trivial. Mild to moderate aortic valve sclerosis/calcification is present, without any evidence of aortic stenosis.  7. The inferior vena cava is dilated in size with <50% respiratory variability, suggesting right  atrial pressure of 15 mmHg. FINDINGS  Left Ventricle: Left ventricular ejection fraction, by estimation, is 55 to 60%. The left ventricle has normal function. The left ventricle has no regional wall motion abnormalities. 3D left ventricular ejection fraction analysis performed but not reported based on interpreter judgement due to suboptimal quality. The left ventricular internal cavity size was mildly to moderately dilated. There is mild left ventricular hypertrophy. Left ventricular diastolic parameters are indeterminate. Right Ventricle: The right ventricular size is normal. Right vetricular wall thickness was not assessed. Right ventricular systolic function is mildly reduced. Left Atrium: Left atrial size was severely dilated. Right Atrium: Right atrial size was moderately dilated. Pericardium: There is no evidence of pericardial effusion. Mitral Valve: Peak and mean gradients through the MV  are 17 and 6 mm Hg respectively consistent with moderate mitral stenosis Compared to echo report from September 2021, no significant change. There is moderate thickening of the mitral valve leaflet(s).  Severe mitral annular calcification. Mild mitral valve regurgitation. MV peak gradient, 17.3 mmHg. The mean mitral valve gradient is 6.0 mmHg. Tricuspid Valve: The tricuspid valve is normal in structure. Tricuspid valve regurgitation is trivial. Aortic Valve: Aortic valve regurgitation is trivial. Mild to moderate aortic valve sclerosis/calcification is present, without any evidence of aortic stenosis. Aortic valve mean gradient measures 3.0 mmHg. Aortic valve peak gradient measures 6.2 mmHg. Aortic valve area, by VTI measures 2.88 cm. Pulmonic Valve: The pulmonic valve was normal in structure. Pulmonic valve regurgitation is trivial. Aorta: The aortic root and ascending aorta are structurally normal, with no evidence of dilitation. Venous: The inferior vena cava is dilated in size with less than 50% respiratory  variability, suggesting right atrial pressure of 15 mmHg. IAS/Shunts: No atrial level shunt detected by color flow Doppler.  LEFT VENTRICLE PLAX 2D LVIDd:         5.70 cm LVIDs:         3.30 cm LV PW:         1.30 cm LV IVS:        1.10 cm LVOT diam:     2.20 cm      3D Volume EF: LV SV:         67           3D EF:        49 % LV SV Index:   34           LV EDV:       128 ml LVOT Area:     3.80 cm     LV ESV:       65 ml                             LV SV:        63 ml  LV Volumes (MOD) LV vol d, MOD A2C: 122.0 ml LV vol d, MOD A4C: 125.0 ml LV vol s, MOD A2C: 59.0 ml LV vol s, MOD A4C: 51.7 ml LV SV MOD A2C:     63.0 ml LV SV MOD A4C:     125.0 ml LV SV MOD BP:      70.6 ml RIGHT VENTRICLE RV Basal diam:  4.40 cm RV Mid diam:    2.90 cm LEFT ATRIUM              Index       RIGHT ATRIUM           Index LA diam:        5.80 cm  2.97 cm/m  RA Area:     23.90 cm LA Vol (A2C):   135.0 ml 69.05 ml/m RA Volume:   76.30 ml  39.03 ml/m LA Vol (A4C):   168.0 ml 85.93 ml/m LA Biplane Vol: 154.0 ml 78.77 ml/m  AORTIC VALVE AV Area (Vmax):    2.80 cm AV Area (Vmean):   2.79 cm AV Area (VTI):     2.88 cm AV Vmax:           125.00 cm/s AV Vmean:          83.900 cm/s AV VTI:            0.234 m AV Peak Grad:  6.2 mmHg AV Mean Grad:      3.0 mmHg LVOT Vmax:         92.00 cm/s LVOT Vmean:        61.600 cm/s LVOT VTI:          0.177 m LVOT/AV VTI ratio: 0.76  AORTA Ao Root diam: 3.40 cm Ao Asc diam:  3.00 cm MITRAL VALVE MV Area (PHT): 3.63 cm     SHUNTS MV Area VTI:   0.96 cm     Systemic VTI:  0.18 m MV Peak grad:  17.3 mmHg    Systemic Diam: 2.20 cm MV Mean grad:  6.0 mmHg MV Vmax:       2.08 m/s MV Vmean:      113.0 cm/s MV Decel Time: 209 msec MV E velocity: 193.00 cm/s MV A velocity: 106.00 cm/s MV E/A ratio:  1.82 Dorris Carnes MD Electronically signed by Dorris Carnes MD Signature Date/Time: 12/19/2020/5:20:04 PM    Final      Lattie Haw, MD 12/20/2020, 6:11 AM PGY-3, [place] Intern pager:  [phone], text pages welcome

## 2020-12-19 NOTE — Consult Note (Signed)
[facility] Renal Consultation Note    Indication for Consultation:  Management of ESRD/hemodialysis; anemia, hypertension/volume and secondary hyperparathyroidism  HPI: Jeremy Mccoy is a 55 y.o. male with a past medical history significant for ESRD on HD, CHF, COPD, HTN, seizure disorder, polysubstance abuse, noncompliance with dialysis, chronic hyperkalemia, and A flutter s/p TEE DCCV (03/2020) who presented to [facility] ED with 1 week history of palpitations and chest pain with HD on Friday.  In the ED he was found to be in A fib with RVR (HR in the 130's) and was admitted for further evaluation and management.  We were consulted to provide HD during his hospitalization.  He admits that he stopped taking his eliquis and coreg on his own for several months.  He is chronically shortening his outpatient HD treatments.  Reports no cocaine for the past 6 months.   Past Medical History:  Diagnosis Date   Anemia    Anxiety    Arthritis    "qwhere" (05/15/2013)   CHF (congestive heart failure) (HCC)    COPD (chronic obstructive pulmonary disease) (HCC)    Crack cocaine use    Dental caries    Depression    ESRD (end stage renal disease) on dialysis ([place])    TTS: [address] [place] (05/15/2013)   Headache(784.0)    "get it from going to dialysis; when they get real bad I come to hospital" (05/15/2013)   Hematemesis/vomiting blood    History of blood transfusion 10/2011; 04/2013   HTN (hypertension) 06/12/2012   Seizures ([place])    Shortness of breath    "just when I have too much potassium is too high" (05/15/2013)   Past Surgical History:  Procedure Laterality Date   AV FISTULA PLACEMENT  11/29/2011   Procedure: ARTERIOVENOUS (AV) FISTULA CREATION;  Surgeon: Angelia Mould, MD;  Location: [place];  Service: Vascular;  Laterality: Left;   CARDIOVERSION N/A 03/31/2020   Procedure: CARDIOVERSION;  Surgeon: Lelon Perla, MD;  Location: [facility] ENDOSCOPY;  Service:  Cardiovascular;  Laterality: N/A;   ESOPHAGOGASTRODUODENOSCOPY N/A 05/17/2013   Procedure: ESOPHAGOGASTRODUODENOSCOPY (EGD);  Surgeon: Beryle Beams, MD;  Location: [facility] ENDOSCOPY;  Service: Endoscopy;  Laterality: N/A;   INGUINAL HERNIA REPAIR Bilateral 1967   IR FLUORO GUIDE CV LINE RIGHT  10/20/2017   IR THROMBECTOMY AV FISTULA W/THROMBOLYSIS/PTA INC/SHUNT/IMG LEFT Left 10/24/2017   IR US GUIDE VASC ACCESS LEFT  10/24/2017   IR US GUIDE VASC ACCESS RIGHT  10/20/2017   RENAL BIOPSY  11/07/2011   TEE WITHOUT CARDIOVERSION N/A 03/31/2020   Procedure: TRANSESOPHAGEAL ECHOCARDIOGRAM (TEE);  Surgeon: Lelon Perla, MD;  Location: [facility] ENDOSCOPY;  Service: Cardiovascular;  Laterality: N/A;   Family History:   Family History  Problem Relation Age of Onset   Heart attack Father    Social History:  reports that he has been smoking cigars and cigarettes. He has a 50.00 pack-year smoking history. He has never used smokeless tobacco. He reports current drug use. Drugs: Marijuana and Cocaine. He reports that he does not drink alcohol. No Known Allergies Prior to Admission medications   Medication Sig Start Date End Date Taking? Authorizing Provider  acetaminophen (TYLENOL) 500 MG tablet Take 1 tablet (500 mg total) by mouth every 8 (eight) hours as needed for mild pain or fever. 6 times a day, 500 mg, 12 to 24 tabs a day, 6,000 to 12,000 mg daily for pain Patient taking differently: Take 500-1,000 mg by mouth every 8 (eight) hours as  needed for mild pain or fever. 08/05/16  Yes Molt, Bethany, DO  albuterol (VENTOLIN HFA) 108 (90 Base) MCG/ACT inhaler Inhale 3-6 puffs into the lungs every 6 (six) hours as needed for wheezing or shortness of breath.    Yes [provider]  bismuth subsalicylate (PEPTO BISMOL) 262 MG/15ML suspension Take 30 mLs by mouth every 6 (six) hours as needed for indigestion or diarrhea or loose stools.   Yes [provider]  diphenhydrAMINE (BENADRYL) 25 mg capsule Take  25 mg by mouth every 6 (six) hours as needed for itching.   Yes [provider]  doxercalciferol (HECTOROL) 0.5 MCG capsule Doxercalciferol (Hectorol) 08/05/20 08/04/21 Yes [provider]  famotidine (PEPCID) 20 MG tablet Take 20 mg by mouth 2 (two) times daily as needed for heartburn or indigestion.   Yes [provider]  hydrOXYzine (ATARAX/VISTARIL) 25 MG tablet Take 1 tablet (25 mg total) by mouth 3 (three) times daily as needed for itching. 04/01/20  Yes Ezequiel Essex, MD  Methoxy PEG-Epoetin Beta (MIRCERA IJ) Mircera 06/15/20 06/14/21 Yes [provider]  pantoprazole (PROTONIX) 40 MG tablet TAKE 1 TABLET (40 MG TOTAL) BY MOUTH DAILY. Patient taking differently: Take 40 mg by mouth daily. 04/01/20 04/01/21 Yes Ezequiel Essex, MD  sevelamer carbonate (RENVELA) 800 MG tablet Take 2 tablets (1,600 mg total) by mouth 3 (three) times daily with meals. Patient taking differently: Take 800-2,400 mg by mouth See admin instructions. Take 2,400 mg by mouth three times a day with meals and 800-1,600 mg with each snack 01/25/14  Yes McLean-Scocuzza, Nino Glow, MD  acetaminophen (OFIRMEV) 10 MG/ML SOLN Take by mouth. Patient not taking: Reported on 12/18/2020 05/27/20 05/26/21  [provider]  amiodarone (PACERONE) 200 MG tablet TAKE 1 TABLET (200 MG TOTAL) BY MOUTH TWO TIMES DAILY FOR 7 DAYS. THEN TAKE 1 TABLET DAILY Patient not taking: No sig reported 04/01/20 04/01/21  Gifford Shave, MD  apixaban (ELIQUIS) 2.5 MG TABS tablet Take 1 tablet (2.5 mg total) by mouth 2 (two) times daily. Patient not taking: No sig reported 04/06/20   Zenia Resides, MD  B Complex-C-Zn-Folic Acid (DIALYVITE/ZINC) TABS Take 1 tablet by mouth daily.  Patient not taking: Reported on 12/18/2020 09/11/19   [provider]  carvedilol (COREG) 3.125 MG tablet Take 1 tablet (3.125 mg total) by mouth 2 (two) times daily with a meal. Patient not taking: Reported on 12/18/2020 05/26/20    Richardson Dopp T, PA-C  hydrALAZINE (APRESOLINE) 25 MG tablet Take 1 tablet (25 mg total) by mouth 3 (three) times daily. Patient not taking: No sig reported 05/26/20   Richardson Dopp T, PA-C  Nutritional Supplements (FEEDING SUPPLEMENT, NEPRO CARB STEADY,) LIQD Take 237 mLs by mouth 3 (three) times daily as needed (Supplement). Patient not taking: No sig reported 05/17/13   Cristal Ford, DO   Current Facility-Administered Medications  Medication Dose Route Frequency Provider Last Rate Last Admin   acetaminophen (TYLENOL) tablet 500 mg  500 mg Oral Q8H PRN Eppie Gibson, MD       albuterol (PROVENTIL) (2.5 MG/3ML) 0.083% nebulizer solution 2.5 mg  2.5 mg Inhalation Q6H PRN Eppie Gibson, MD       carvedilol (COREG) tablet 6.25 mg  6.25 mg Oral BID WC Minus Breeding, MD       famotidine (PEPCID) tablet 20 mg  20 mg Oral Daily PRN Eppie Gibson, MD       hydrOXYzine (ATARAX/VISTARIL) tablet 25 mg  25 mg Oral TID  PRN Eppie Gibson, MD       multivitamin (RENA-VIT) tablet 1 tablet  1 tablet Oral Daily Eppie Gibson, MD       nicotine (NICODERM CQ - dosed in mg/24 hours) patch 14 mg  14 mg Transdermal Daily Eppie Gibson, MD       pantoprazole (PROTONIX) EC tablet 40 mg  40 mg Oral Daily Eppie Gibson, MD       sevelamer carbonate (RENVELA) tablet 1,600 mg  1,600 mg Oral TID WC Eppie Gibson, MD       umeclidinium bromide (INCRUSE ELLIPTA) 62.5 MCG/INH 1 puff  1 puff Inhalation Daily Eppie Gibson, MD       Labs: Basic Metabolic Panel: Recent Labs  Lab 12/18/20 1441 12/19/20 0459  NA 136  --   K 3.4*  --   CL 92*  --   CO2 30  --   GLUCOSE 97  --   BUN 34*  --   CREATININE 8.66*  --   CALCIUM 9.7  --   PHOS  --  7.4*   Liver Function Tests: Recent Labs  Lab 12/18/20 1441  AST 12*  ALT 13  ALKPHOS 89  BILITOT 0.4  PROT 7.4  ALBUMIN 3.5   No results for input(s): LIPASE, AMYLASE in the last 168 hours. No results for input(s): AMMONIA in the last 168  hours. CBC: Recent Labs  Lab 12/18/20 1441 12/19/20 0459  WBC 4.9 5.0  NEUTROABS 3.4  --   HGB 11.8* 10.4*  HCT 36.9* 32.8*  MCV 99.2 99.7  PLT 174 166   Cardiac Enzymes: No results for input(s): CKTOTAL, CKMB, CKMBINDEX, TROPONINI in the last 168 hours. CBG: No results for input(s): GLUCAP in the last 168 hours. Iron Studies: No results for input(s): IRON, TIBC, TRANSFERRIN, FERRITIN in the last 72 hours. Studies/Results: DG Chest 2 View  Result Date: 12/18/2020 CLINICAL DATA:  Chest pain and palpitations. EXAM: CHEST - 2 VIEW COMPARISON:  Multiple prior studies.  The most recent is 03/29/2020 FINDINGS: The cardiac silhouette, mediastinal and hilar contours are stable. Prominent pulmonary hila and calcifications likely related to remote granulomatous disease. Stable scattered bilateral calcified granulomas. Chronic reticulonodular interstitial pattern in the lungs likely process such as respiratory bronchiolitis. No focal infiltrates, superimposed pulmonary edema, pleural effusions or pneumothorax. The bony thorax is intact. IMPRESSION: Stable chronic lung changes. No acute pulmonary findings. Electronically Signed   By: Marijo Sanes M.D.   On: 12/18/2020 15:31    ROS: Pertinent items are noted in HPI. Physical Exam: Vitals:   12/19/20 0155 12/19/20 0300 12/19/20 0452 12/19/20 0731  BP:  (!) 115/96 130/84 (!) 130/92  Pulse:  (!) 112 84 72  Resp:  19  14  Temp:  98.7 F (37.1 C)  98.1 F (36.7 C)  TempSrc:  Oral  Oral  SpO2:  94%  94%  Weight:      Height: 6' (1.829 m)         Weight change:   Intake/Output Summary (Last 24 hours) at 12/19/2020 1013 Last data filed at 12/19/2020 0744 Gross per 24 hour  Intake 77 ml  Output 0 ml  Net 77 ml   BP (!) 130/92 (BP Location: Right Wrist)   Pulse 72   Temp 98.1 F (36.7 C) (Oral)   Resp 14   Ht 6' (1.829 m)   Wt 74.1 kg   SpO2 94%   BMI 22.16 kg/m  General appearance: alert, cooperative,  and no distress Head:  Normocephalic, without obvious abnormality, atraumatic Resp: clear to auscultation bilaterally Cardio: regular rate and rhythm, S1, S2 normal, no murmur, click, rub or gallop GI: soft, non-tender; bowel sounds normal; no masses,  no organomegaly Extremities: extremities normal, atraumatic, no cyanosis or edema and LAVF +T/B Dialysis Access:  Dialysis Orders: Center: [facility]  on MWF . EDW 71 kg HD [place] 1K/2.5Ca  Time 4 hrs Heparin none. Access LUE AVF BFR 400 DFR A1.5    Hectoral 7 mcg IV/HD, micera 200 mcg IV/HD   Other Parsabiv 15 mg IVP TIW  Assessment/Plan:  Atrial Fibrillation with RVR - already in NSR after given metoprolol (Dilt contraindicated in CHF).  Cardiology following and restarted eliquis and coreg.  ECHO pending.  ESRD -  continue with HD MWF  Hypertension/volume  - stable  Anemia  - stable  Metabolic bone disease -   continue with binders and renal diet.  Nutrition - renal diet. Disposition - per cardiology and primary svc.   Donetta Potts, MD [place] Pager [phone] 12/19/2020, 10:13 AM

## 2020-12-19 NOTE — Progress Notes (Signed)
PT Cancellation Note  Patient Details Name: Jeremy Mccoy MRN: [id number] DOB: 1965/05/17   Cancelled Treatment:    Reason Eval/Treat Not Completed: Other (comment) per chart, looks to be possibly receiving cardioversion today. Will f/u with RN and attempt eval if medically ready later and if time/schedule allow.   Windell Norfolk, DPT, PN2   Supplemental Physical Therapist [place]    Pager [phone] Acute Rehab Office [phone]

## 2020-12-19 NOTE — Progress Notes (Signed)
PT Cancellation Note  Patient Details Name: HARVELL DEMUS MRN: [id number] DOB: 02-Sep-1965   Cancelled Treatment:    Reason Eval/Treat Not Completed: PT screened, no needs identified, will sign off per OT, patient independent with mobility and no skilled PT needs noted. Signing off, thank you for the opportunity to participate in his care!   Windell Norfolk, DPT, PN2   Supplemental Physical Therapist [place]    Pager [phone] Acute Rehab Office [phone]

## 2020-12-19 NOTE — Progress Notes (Signed)
Family Medicine Teaching Service Daily Progress Note Intern Pager: [phone]  Patient name: ARLY SEGALLA Medical record number: [id number] Date of birth: 11/30/1965 Age: 55 y.o. Gender: male  Primary Care Provider: Patient, No Pcp Per (Inactive) Consultants: Cardiology, Nephrology Code Status: Full  Pt Overview and Major Events to Date:  08/06: Admitted to FPTS  Assessment and Plan: KRISTINE TIGNOR is a 55 y.o. male presenting with palpitations, found to be in A Fib with RVR. PMH is significant for A Fib previously on Coreg, Amiodarone, and Eliquis; HFrEF; COPD; ESRD on HD MWF, history of polysubstance abuse.     A Fib with RVR Converted to NSR.  Denies any chest pain or SOB.  -Cardiology following, recommends d/c Heparin, start DOAC and switched Metoprolol to Carvedilol -ECHO pending -no plan for DCCV now that has converted to NSR -f/u TSH -PT/OT  ESRD on HD MWF  HD yesterday.  Nephro consulted in ED and will put on list for dialysis if remains inpatient. -f/u Nephro consult -Continue Rena Vit -Avoid nephrotoxic agents.   HFrEF  Last EF 30-35%.  Euvolemic -Cards following -ECHO pending  -Daily weight -Strict I&O  Elevated Troponin Chest Pain (atypical) Likely demand from atrial tachycardia.  No further chest pain -Cards recommends outpatient ischemic workup  HTN Soft BP overnight 97-130/84-96 -Holding home Hydralazine -Cardiology switched BB  COPD  Tobacco Use Disorder Encourage smoking cessation Nicoderm patch 14 mg daily Continue Umeclidinium daily Follow up outpatient PFTs  Normocytic Anemia Likely AOC secondary to ESRD. Last Ferritin 1290.  No signs bleeding.  Refused GI outpatient workup -Monitor CBC -Follow up cancer screening outpatient  Dysphagia, suspect oropharyngeal EGD in 2015 show low grade esophagitis and small hiatal hernia. Recommended to continue PPI.  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 -Continue PPI -Consider GI outpatient   -f/u SLP   FEN/GI: Renal diet PPx: Eliquis Dispo:Home tomorrow. Barriers include None.   Subjective:  Denies any chest pain or worsening shortness of breath. Converted to NSR on monitor  Objective: Temp:  [97.8 F (36.6 C)] 97.8 F (36.6 C) (08/05 1428) Pulse Rate:  [60-143] 119 (08/05 2336) Resp:  [13-28] 22 (08/05 2245) BP: (97-127)/(76-104) 122/104 (08/05 2336) SpO2:  [94 %-100 %] 97 % (08/05 2245) Physical Exam: General: 55 y.o. male in NAD Cardio: RRR no m/r/g Lungs: CTAB, no wheezing, no rhonchi, no crackles, no IWOB on room air Abdomen: Soft, non-tender to palpation, non-distended, positive bowel sounds Skin: warm and dry Extremities: No edema   Laboratory: Recent Labs  Lab 12/18/20 1441  WBC 4.9  HGB 11.8*  HCT 36.9*  PLT 174   Recent Labs  Lab 12/18/20 1441  NA 136  K 3.4*  CL 92*  CO2 30  BUN 34*  CREATININE 8.66*  CALCIUM 9.7  PROT 7.4  BILITOT 0.4  ALKPHOS 89  ALT 13  AST 12*  GLUCOSE 97     Imaging/Diagnostic Tests: DG Chest 2 View  Result Date: 12/18/2020 CLINICAL DATA:  Chest pain and palpitations. EXAM: CHEST - 2 VIEW COMPARISON:  Multiple prior studies.  The most recent is 03/29/2020 FINDINGS: The cardiac silhouette, mediastinal and hilar contours are stable. Prominent pulmonary hila and calcifications likely related to remote granulomatous disease. Stable scattered bilateral calcified granulomas. Chronic reticulonodular interstitial pattern in the lungs likely process such as respiratory bronchiolitis. No focal infiltrates, superimposed pulmonary edema, pleural effusions or pneumothorax. The bony thorax is intact. IMPRESSION: Stable chronic lung changes. No acute pulmonary findings. Electronically Signed   By: Mamie Nick.  Gallerani M.D.   On: 12/18/2020 15:31     Carollee Leitz, MD 12/19/2020, 12:25 AM PGY-3, [place] Intern pager: [phone], text pages welcome

## 2020-12-19 NOTE — Progress Notes (Signed)
FPTS Brief Progress Note  S: Patient seen and examined at bedside for nightly rounds. He denies complaints. No further palpitations, chest pain, or SOB.  Reports he wasn't taking his beta blocker because he got it confused with another med. Was off the Eliquis because he did not have any refills. Understands the need to take them going forward. He's hoping to go home tomorrow.  O: BP 126/85 (BP Location: Right Wrist)   Pulse 78   Temp (!) 97.4 F (36.3 C) (Oral)   Resp 19   Ht 6' (1.829 m)   Wt 74.1 kg   SpO2 98%   BMI 22.16 kg/m   Gen: alert, resting comfortably, NAD CV: RRR, normal S1/S2 without m/r/g Resp: normal effort, lungs CTA Abd: soft, nontender Ext: no peripheral edema  A/P: Remains in normal sinus rhythm.  -Continue Eliquis and Coreg -Orders reviewed. Labs for AM ordered, which was adjusted as needed.  -Remainder of plan per FPTS daily progress note and cardiology team   Alcus Dad, MD 12/19/2020, 8:43 PM PGY-2, [facility] Night Resident  Please page [phone] with questions.

## 2020-12-19 NOTE — Progress Notes (Signed)
  Echocardiogram 2D Echocardiogram has been performed.  Merrie Roof F 12/19/2020, 4:23 PM

## 2020-12-19 NOTE — Progress Notes (Signed)
Progress Note  Patient Name: CARDEL WERNETTE Date of Encounter: 12/19/2020  Primary Cardiologist:   Fransico Him, MD   Subjective   Feels OK.  No pain.  No SOB.   Inpatient Medications    Scheduled Meds:  metoprolol tartrate  25 mg Oral Q6H   multivitamin  1 tablet Oral Daily   nicotine  14 mg Transdermal Daily   pantoprazole  40 mg Oral Daily   sevelamer carbonate  1,600 mg Oral TID WC   umeclidinium bromide  1 puff Inhalation Daily   Continuous Infusions:  heparin 1,250 Units/hr (12/19/20 0642)   PRN Meds: acetaminophen, albuterol, famotidine, hydrOXYzine   Vital Signs    Vitals:   12/19/20 0155 12/19/20 0300 12/19/20 0452 12/19/20 0731  BP:  (!) 115/96 130/84 (!) 130/92  Pulse:  (!) 112 84 72  Resp:  19  14  Temp:  98.7 F (37.1 C)  98.1 F (36.7 C)  TempSrc:  Oral  Oral  SpO2:  94%  94%  Weight:      Height: 6' (1.829 m)       Intake/Output Summary (Last 24 hours) at 12/19/2020 0843 Last data filed at 12/19/2020 0744 Gross per 24 hour  Intake 77 ml  Output 0 ml  Net 77 ml   Filed Weights   12/19/20 0100  Weight: 74.1 kg    Telemetry    NSR - Personally Reviewed  ECG    NA - Personally Reviewed  Physical Exam   GEN: No acute distress.   Neck: No  JVD Cardiac: RRR, no murmurs, rubs, or gallops.  Respiratory: Clear  to auscultation bilaterally. GI: Soft, nontender, non-distended  MS: No  edema; No deformity. Neuro:  Nonfocal  Psych: Normal affect   Labs    Chemistry Recent Labs  Lab 12/18/20 1441  NA 136  K 3.4*  CL 92*  CO2 30  GLUCOSE 97  BUN 34*  CREATININE 8.66*  CALCIUM 9.7  PROT 7.4  ALBUMIN 3.5  AST 12*  ALT 13  ALKPHOS 89  BILITOT 0.4  GFRNONAA 7*  ANIONGAP 14     Hematology Recent Labs  Lab 12/18/20 1441 12/19/20 0459  WBC 4.9 5.0  RBC 3.72* 3.29*  HGB 11.8* 10.4*  HCT 36.9* 32.8*  MCV 99.2 99.7  MCH 31.7 31.6  MCHC 32.0 31.7  RDW 15.8* 15.9*  PLT 174 166    Cardiac EnzymesNo results for  input(s): TROPONINI in the last 168 hours. No results for input(s): TROPIPOC in the last 168 hours.   BNPNo results for input(s): BNP, PROBNP in the last 168 hours.   DDimer No results for input(s): DDIMER in the last 168 hours.   Radiology    DG Chest 2 View  Result Date: 12/18/2020 CLINICAL DATA:  Chest pain and palpitations. EXAM: CHEST - 2 VIEW COMPARISON:  Multiple prior studies.  The most recent is 03/29/2020 FINDINGS: The cardiac silhouette, mediastinal and hilar contours are stable. Prominent pulmonary hila and calcifications likely related to remote granulomatous disease. Stable scattered bilateral calcified granulomas. Chronic reticulonodular interstitial pattern in the lungs likely process such as respiratory bronchiolitis. No focal infiltrates, superimposed pulmonary edema, pleural effusions or pneumothorax. The bony thorax is intact. IMPRESSION: Stable chronic lung changes. No acute pulmonary findings. Electronically Signed   By: Marijo Sanes M.D.   On: 12/18/2020 15:31    Cardiac Studies   ECHO pending  Patient Profile     55 y.o. male with a hx of ESRD  on HD, HTN, COPD/tobacco use, anemia, GERD, prior h/o cocaine use, Aflutter s/p TEE DCCV (03/2020) who is being seen 12/18/2020 for the evaluation of afib RVR at the request of Dr Gwendlyn Deutscher.  Assessment & Plan    PAF:  He has been off of meds including his Eliquis and beta blocker.   Started on heparin.  Now in NSR.  Discussed need to continue DOAC and beta blocker.  Ok to stop heparin.      CHRONIC HF:  Seems to be euvolemic.    Echo is pending   Previous echo with low normal EF  ELEVATED TROPONIN:  Awaiting echo but likely his trop was secondary to ESRD plus atrial fib with rapid rate.  I don't suspect acute ischemia .     For questions or updates, please contact [facility] Please consult [email or web address] for contact info under Cardiology/STEMI.   Signed, Minus Breeding, MD  12/19/2020, 8:43 AM

## 2020-12-19 NOTE — Progress Notes (Signed)
[facility] for heparin > apixaban Indication: atrial fibrillation  Labs: Recent Labs    12/18/20 1441 12/18/20 1901 12/18/20 2101 12/19/20 0459  HGB 11.8*  --   --  10.4*  HCT 36.9*  --   --  32.8*  PLT 174  --   --  166  APTT  --   --   --  44*  HEPARINUNFRC  --   --   --  <0.10*  CREATININE 8.66*  --   --   --   TROPONINIHS  --  99* 96*  --     Assessment: 55 YOM with palpitation found to be in Afib. Per triage note, patient has not been taking Eliquis for months due to a mixup with his medications. Pharmacy consulted to start IV heparin for AFib.   H/H slightly low. Plt wnl. Of note, patient has a h/o of ESRD on HD   Heparin drip > to apixaban today  Wt > 60kg, age < 17yo Cr > 1.5    Goal of Therapy:  Heparin level 0.3-0.7 units/ml Monitor platelets by anticoagulation protocol: Yes   Plan:  Stop heparin drip Begin apixabn '5mg'$  BID   Bonnita Nasuti Pharm.D. CPP, BCPS Clinical Pharmacist [phone] 12/19/2020 10:56 AM

## 2020-12-19 NOTE — Evaluation (Signed)
Occupational Therapy Evaluation Patient Details Name: Jeremy Mccoy MRN: [id number] DOB: 1965/10/20 Today's Date: 12/19/2020    History of Present Illness 55 y.o. male with a hx of ESRD on HD, HTN, COPD/tobacco use, anemia, GERD, prior h/o cocaine use, Aflutter s/p TEE DCCV (03/2020) who is being seen 12/18/2020 for the evaluation of afib RVR.   Clinical Impression   Patient admitted for the diagnosis above. PTA he lives with several roommates and has someone around 24 hour a day if needed.  He is at his baseline for mobility and ADL completion.  The patient walked 750'+ without an AD.  His HR ranged from 85-107 during mobility with regular rate per the monitor.  He was completing his lunch upon entering with no cough noted.  He is hoping to discharge today if cleared medically.  No OT needs identified, and no post acute needs anticipated.      Follow Up Recommendations  No OT follow up    Equipment Recommendations  None recommended by OT    Recommendations for Other Services       Precautions / Restrictions Precautions Precautions: Other (comment) Precaution Comments: HR Restrictions Weight Bearing Restrictions: No      Mobility Bed Mobility Overal bed mobility: Independent               Patient Response: Cooperative  Transfers Overall transfer level: Independent                    Balance Overall balance assessment: No apparent balance deficits (not formally assessed)                                         ADL either performed or assessed with clinical judgement   ADL Overall ADL's : At baseline                                             Vision Baseline Vision/History: Wears glasses Wears Glasses: Reading only Patient Visual Report: No change from baseline                  Pertinent Vitals/Pain Pain Assessment: No/denies pain     Hand Dominance Right   Extremity/Trunk Assessment Upper Extremity  Assessment Upper Extremity Assessment: Overall WFL for tasks assessed   Lower Extremity Assessment Lower Extremity Assessment: Overall WFL for tasks assessed   Cervical / Trunk Assessment Cervical / Trunk Assessment: Normal   Communication Communication Communication: No difficulties   Cognition Arousal/Alertness: Awake/alert Behavior During Therapy: WFL for tasks assessed/performed Overall Cognitive Status: Within Functional Limits for tasks assessed                                                      Home Living Family/patient expects to be discharged to:: Private residence Living Arrangements: Non-relatives/Friends Available Help at Discharge: Available 24 hours/day Type of Home: House Home Access: Stairs to enter [employer] of Steps: 2 Entrance Stairs-Rails: None Home Layout: Laundry or work area in basement;One level     Bathroom Shower/Tub: Teacher, early years/pre: Standard     Home Equipment:  None          Prior Functioning/Environment Level of Independence: Independent        Comments: Pt continues to receive HD        OT Problem List: Other (comment);Decreased activity tolerance (Heart Rate)      OT Treatment/Interventions:      OT Goals(Current goals can be found in the care plan section) Acute Rehab OT Goals Patient Stated Goal: Take my Meds OT Goal Formulation: With patient Time For Goal Achievement: 12/19/20 Potential to Achieve Goals: Good  OT Frequency:     Barriers to D/C:  None noted          Co-evaluation              AM-PAC OT "6 Clicks" Daily Activity     Outcome Measure Help from another person eating meals?: None Help from another person taking care of personal grooming?: None Help from another person toileting, which includes using toliet, bedpan, or urinal?: None Help from another person bathing (including washing, rinsing, drying)?: None Help from another person to put  on and taking off regular upper body clothing?: None Help from another person to put on and taking off regular lower body clothing?: None 6 Click Score: 24   End of Session Nurse Communication: Mobility status  Activity Tolerance: Patient tolerated treatment well Patient left: in bed;with call bell/phone within reach  OT Visit Diagnosis: Other (comment);Dizziness and giddiness (R42) (RVR)                Time: [id number] OT Time Calculation (min): 14 min Charges:  OT General Charges $OT Visit: 1 Visit OT Evaluation $OT Eval Moderate Complexity: 1 Mod  12/19/2020  Rich, OTR/L  Acute Rehabilitation Services  Office:  [place] 12/19/2020, 12:18 PM

## 2020-12-19 NOTE — Evaluation (Addendum)
SLP Cancellation Note  Patient Details Name: Jeremy Mccoy MRN: [id number] DOB: 1965-08-26   Cancelled treatment:       Reason Eval/Treat Not Completed: Other (comment) (Pt currently npo for possible cardioversion, will continue efforts conduct swallow evaluation. Thanks for the order.)  SLP secure chatted RN Jeremy Mccoy re: current NPO status and requested she contact his SLP when/if pt able to have po today.    Kathleen Lime, MS [facility] SLP Acute Rehab Services Office [phone] Pager [phone]  Macario Golds 12/19/2020, 7:58 AM

## 2020-12-20 ENCOUNTER — Other Ambulatory Visit: Payer: Self-pay | Admitting: Family Medicine

## 2020-12-20 DIAGNOSIS — I480 Paroxysmal atrial fibrillation: Secondary | ICD-10-CM | POA: Diagnosis not present

## 2020-12-20 DIAGNOSIS — N186 End stage renal disease: Secondary | ICD-10-CM | POA: Diagnosis not present

## 2020-12-20 DIAGNOSIS — R7989 Other specified abnormal findings of blood chemistry: Secondary | ICD-10-CM | POA: Diagnosis not present

## 2020-12-20 DIAGNOSIS — I4891 Unspecified atrial fibrillation: Secondary | ICD-10-CM | POA: Diagnosis not present

## 2020-12-20 DIAGNOSIS — I509 Heart failure, unspecified: Secondary | ICD-10-CM | POA: Diagnosis not present

## 2020-12-20 DIAGNOSIS — R002 Palpitations: Secondary | ICD-10-CM | POA: Diagnosis not present

## 2020-12-20 LAB — CBC
HCT: 31.1 % — ABNORMAL LOW (ref 39.0–52.0)
Hemoglobin: 10 g/dL — ABNORMAL LOW (ref 13.0–17.0)
MCH: 31.3 pg (ref 26.0–34.0)
MCHC: 32.2 g/dL (ref 30.0–36.0)
MCV: 97.2 fL (ref 80.0–100.0)
Platelets: 165 10*3/uL (ref 150–400)
RBC: 3.2 MIL/uL — ABNORMAL LOW (ref 4.22–5.81)
RDW: 15.6 % — ABNORMAL HIGH (ref 11.5–15.5)
WBC: 4.6 10*3/uL (ref 4.0–10.5)
nRBC: 0 % (ref 0.0–0.2)

## 2020-12-20 MED ORDER — UMECLIDINIUM BROMIDE 62.5 MCG/INH IN AEPB: 1.0000 | INHALATION_SPRAY | Freq: Every day | RESPIRATORY_TRACT | 2 refills | Status: DC

## 2020-12-20 MED ORDER — APIXABAN 5 MG PO TABS: 5.0000 mg | ORAL_TABLET | Freq: Two times a day (BID) | ORAL | 0 refills | Status: DC

## 2020-12-20 MED ORDER — CARVEDILOL 3.125 MG PO TABS: 6.2500 mg | ORAL_TABLET | Freq: Two times a day (BID) | ORAL | 1 refills | Status: DC

## 2020-12-20 NOTE — Progress Notes (Signed)
Progress Note  Patient Name: Jeremy Mccoy Date of Encounter: 12/20/2020  Primary Cardiologist:   Fransico Him, MD   Subjective   No pain.  No SOB.  He is very anxious to go home.   Inpatient Medications    Scheduled Meds:  apixaban  5 mg Oral BID   carvedilol  6.25 mg Oral BID WC   multivitamin  1 tablet Oral Daily   nicotine  14 mg Transdermal Daily   pantoprazole  40 mg Oral Daily   sevelamer carbonate  1,600 mg Oral TID WC   umeclidinium bromide  1 puff Inhalation Daily   Continuous Infusions:   PRN Meds: acetaminophen, albuterol, famotidine, hydrOXYzine   Vital Signs    Vitals:   12/19/20 1958 12/20/20 0028 12/20/20 0406 12/20/20 0728  BP: 126/85 (!) 131/91 (!) 152/109 (!) 151/95  Pulse: 78 75 77 83  Resp: '19 20 18 19  '$ Temp: (!) 97.4 F (36.3 C) 98 F (36.7 C) (!) 96.5 F (35.8 C) (!) 96.6 F (35.9 C)  TempSrc: Oral Oral Axillary Axillary  SpO2: 98% 95% 96% 96%  Weight:   75.7 kg   Height:        Intake/Output Summary (Last 24 hours) at 12/20/2020 0826 Last data filed at 12/20/2020 0700 Gross per 24 hour  Intake 1048.89 ml  Output --  Net 1048.89 ml   Filed Weights   12/19/20 0100 12/20/20 0406  Weight: 74.1 kg 75.7 kg    Telemetry    NSR - Personally Reviewed  ECG    NA - Personally Reviewed  Physical Exam   GEN: No  acute distress.   Neck: No  JVD Cardiac: RRR, 2/6 apical systolic murmur, no diastolic murmurs, rubs, or gallops.  Respiratory: Clear   to auscultation bilaterally. GI: Soft, nontender, non-distended, normal bowel sounds  MS:  No edema; No deformity. Neuro:   Nonfocal  Psych: Oriented and appropriate   Labs    Chemistry Recent Labs  Lab 12/18/20 1441 12/19/20 0459  NA 136 132*  K 3.4* 4.2  CL 92* 92*  CO2 30 26  GLUCOSE 97 77  BUN 34* 49*  CREATININE 8.66* 10.58*  CALCIUM 9.7 9.1  PROT 7.4  --   ALBUMIN 3.5 3.1*  AST 12*  --   ALT 13  --   ALKPHOS 89  --   BILITOT 0.4  --   GFRNONAA 7* 5*  ANIONGAP  14 14     Hematology Recent Labs  Lab 12/18/20 1441 12/19/20 0459 12/20/20 0013  WBC 4.9 5.0 4.6  RBC 3.72* 3.29* 3.20*  HGB 11.8* 10.4* 10.0*  HCT 36.9* 32.8* 31.1*  MCV 99.2 99.7 97.2  MCH 31.7 31.6 31.3  MCHC 32.0 31.7 32.2  RDW 15.8* 15.9* 15.6*  PLT 174 166 165    Cardiac EnzymesNo results for input(s): TROPONINI in the last 168 hours. No results for input(s): TROPIPOC in the last 168 hours.   BNPNo results for input(s): BNP, PROBNP in the last 168 hours.   DDimer No results for input(s): DDIMER in the last 168 hours.   Radiology    DG Chest 2 View  Result Date: 12/18/2020 CLINICAL DATA:  Chest pain and palpitations. EXAM: CHEST - 2 VIEW COMPARISON:  Multiple prior studies.  The most recent is 03/29/2020 FINDINGS: The cardiac silhouette, mediastinal and hilar contours are stable. Prominent pulmonary hila and calcifications likely related to remote granulomatous disease. Stable scattered bilateral calcified granulomas. Chronic reticulonodular interstitial pattern in the  lungs likely process such as respiratory bronchiolitis. No focal infiltrates, superimposed pulmonary edema, pleural effusions or pneumothorax. The bony thorax is intact. IMPRESSION: Stable chronic lung changes. No acute pulmonary findings. Electronically Signed   By: Marijo Sanes M.D.   On: 12/18/2020 15:31   ECHOCARDIOGRAM COMPLETE  Result Date: 12/19/2020    ECHOCARDIOGRAM REPORT   Patient Name:   Jeremy Mccoy [facility] Date of Exam: 12/19/2020 Medical Rec #:  [id number]        Height:       72.0 in Accession #:    [id number]       Weight:       163.4 lb Date of Birth:  1965/06/07        BSA:          1.955 m Patient Age:    53 years         BP:           149/109 mmHg Patient Gender: M                HR:           85 bpm. Exam Location:  Inpatient Procedure: 2D Echo, Cardiac Doppler, Color Doppler and 3D Echo Indications:    R06.02 SOB  History:        Patient has prior history of Echocardiogram examinations, most                  recent 03/31/2020. Arrythmias:Atrial Fibrillation and Atrial                 Flutter.  Sonographer:    Merrie Roof RDCS Referring Phys: [place]  1. Left ventricular ejection fraction, by estimation, is 55 to 60%. The left ventricle has normal function. The left ventricle has no regional wall motion abnormalities. The left ventricular internal cavity size was mildly to moderately dilated. There is mild left ventricular hypertrophy. Left ventricular diastolic parameters are indeterminate.  2. Right ventricular systolic function is mildly reduced. The right ventricular size is normal.  3. Left atrial size was severely dilated.  4. Right atrial size was moderately dilated.  5. Peak and mean gradients through the MV are 17 and 6 mm Hg respectively consistent with moderate mitral stenosis Compared to echo report from September 2021, no significant change . Mild mitral valve regurgitation. Severe mitral annular calcification.  6. Aortic valve regurgitation is trivial. Mild to moderate aortic valve sclerosis/calcification is present, without any evidence of aortic stenosis.  7. The inferior vena cava is dilated in size with <50% respiratory variability, suggesting right atrial pressure of 15 mmHg. FINDINGS  Left Ventricle: Left ventricular ejection fraction, by estimation, is 55 to 60%. The left ventricle has normal function. The left ventricle has no regional wall motion abnormalities. 3D left ventricular ejection fraction analysis performed but not reported based on interpreter judgement due to suboptimal quality. The left ventricular internal cavity size was mildly to moderately dilated. There is mild left ventricular hypertrophy. Left ventricular diastolic parameters are indeterminate. Right Ventricle: The right ventricular size is normal. Right vetricular wall thickness was not assessed. Right ventricular systolic function is mildly reduced. Left Atrium: Left atrial size was severely  dilated. Right Atrium: Right atrial size was moderately dilated. Pericardium: There is no evidence of pericardial effusion. Mitral Valve: Peak and mean gradients through the MV are 17 and 6 mm Hg respectively consistent with moderate mitral stenosis Compared to echo report from September 2021, no significant change. There  is moderate thickening of the mitral valve leaflet(s).  Severe mitral annular calcification. Mild mitral valve regurgitation. MV peak gradient, 17.3 mmHg. The mean mitral valve gradient is 6.0 mmHg. Tricuspid Valve: The tricuspid valve is normal in structure. Tricuspid valve regurgitation is trivial. Aortic Valve: Aortic valve regurgitation is trivial. Mild to moderate aortic valve sclerosis/calcification is present, without any evidence of aortic stenosis. Aortic valve mean gradient measures 3.0 mmHg. Aortic valve peak gradient measures 6.2 mmHg. Aortic valve area, by VTI measures 2.88 cm. Pulmonic Valve: The pulmonic valve was normal in structure. Pulmonic valve regurgitation is trivial. Aorta: The aortic root and ascending aorta are structurally normal, with no evidence of dilitation. Venous: The inferior vena cava is dilated in size with less than 50% respiratory variability, suggesting right atrial pressure of 15 mmHg. IAS/Shunts: No atrial level shunt detected by color flow Doppler.  LEFT VENTRICLE PLAX 2D LVIDd:         5.70 cm LVIDs:         3.30 cm LV PW:         1.30 cm LV IVS:        1.10 cm LVOT diam:     2.20 cm      3D Volume EF: LV SV:         67           3D EF:        49 % LV SV Index:   34           LV EDV:       128 ml LVOT Area:     3.80 cm     LV ESV:       65 ml                             LV SV:        63 ml  LV Volumes (MOD) LV vol d, MOD A2C: 122.0 ml LV vol d, MOD A4C: 125.0 ml LV vol s, MOD A2C: 59.0 ml LV vol s, MOD A4C: 51.7 ml LV SV MOD A2C:     63.0 ml LV SV MOD A4C:     125.0 ml LV SV MOD BP:      70.6 ml RIGHT VENTRICLE RV Basal diam:  4.40 cm RV Mid diam:    2.90  cm LEFT ATRIUM              Index       RIGHT ATRIUM           Index LA diam:        5.80 cm  2.97 cm/m  RA Area:     23.90 cm LA Vol (A2C):   135.0 ml 69.05 ml/m RA Volume:   76.30 ml  39.03 ml/m LA Vol (A4C):   168.0 ml 85.93 ml/m LA Biplane Vol: 154.0 ml 78.77 ml/m  AORTIC VALVE AV Area (Vmax):    2.80 cm AV Area (Vmean):   2.79 cm AV Area (VTI):     2.88 cm AV Vmax:           125.00 cm/s AV Vmean:          83.900 cm/s AV VTI:            0.234 m AV Peak Grad:      6.2 mmHg AV Mean Grad:      3.0 mmHg LVOT Vmax:  92.00 cm/s LVOT Vmean:        61.600 cm/s LVOT VTI:          0.177 m LVOT/AV VTI ratio: 0.76  AORTA Ao Root diam: 3.40 cm Ao Asc diam:  3.00 cm MITRAL VALVE MV Area (PHT): 3.63 cm     SHUNTS MV Area VTI:   0.96 cm     Systemic VTI:  0.18 m MV Peak grad:  17.3 mmHg    Systemic Diam: 2.20 cm MV Mean grad:  6.0 mmHg MV Vmax:       2.08 m/s MV Vmean:      113.0 cm/s MV Decel Time: 209 msec MV E velocity: 193.00 cm/s MV A velocity: 106.00 cm/s MV E/A ratio:  1.82 Dorris Carnes MD Electronically signed by Dorris Carnes MD Signature Date/Time: 12/19/2020/5:20:04 PM    Final     Cardiac Studies   ECHO    1. Left ventricular ejection fraction, by estimation, is 55 to 60%. The  left ventricle has normal function. The left ventricle has no regional  wall motion abnormalities. The left ventricular internal cavity size was  mildly to moderately dilated. There  is mild left ventricular hypertrophy. Left ventricular diastolic  parameters are indeterminate.   2. Right ventricular systolic function is mildly reduced. The right  ventricular size is normal.   3. Left atrial size was severely dilated.   4. Right atrial size was moderately dilated.   5. Peak and mean gradients through the MV are 17 and 6 mm Hg respectively  consistent with moderate mitral stenosis Compared to echo report from  September 2021, no significant change . Mild mitral valve regurgitation.  Severe mitral annular  calcification.   6. Aortic valve regurgitation is trivial. Mild to moderate aortic valve  sclerosis/calcification is present, without any evidence of aortic  stenosis.   7. The inferior vena cava is dilated in size with <50% respiratory  variability, suggesting right atrial pressure of 15 mmHg.   Patient Profile     55 y.o. male with a hx of ESRD on HD, HTN, COPD/tobacco use, anemia, GERD, prior h/o cocaine use, Aflutter s/p TEE DCCV (03/2020) who is being seen 12/18/2020 for the evaluation of afib RVR at the request of Dr Gwendlyn Deutscher.  Assessment & Plan    PAF:  Maintaining NSR.   Back on Eliquis.  NO change in therapy.  He understands the importance of taking his meds.    CHRONIC HF:  Seems to be euvolemic.    Echo as above.     ELEVATED TROPONIN:  Given no symptoms and no change in echo as well as alternative reasons for elevated troponin (ESRD, rapid rate atrial fib) I don't suspect that this is diagnostic of an acute coronary syndrome.    MITRAL STENOSIS:    This appears to be unchanged from previous and can be followed clinically.       For questions or updates, please contact [place] Please consult [email or web address] for contact info under Cardiology/STEMI.   Signed, Minus Breeding, MD  12/20/2020, 8:26 AM

## 2020-12-20 NOTE — Hospital Course (Addendum)
[name] is a 55 y.o. male presenting with palpitations, found to be in A Fib with RVR. PMH is significant for A Fib previously on Coreg, Amiodarone, and Eliquis; HFrEF; COPD; ESRD on HD MWF, history of polysubstance abuse.     A. fib with RVR Patient was not admitted with 1 week history of palpitations in the setting of medication noncompliance.  He reports he was not taking his Coreg or Eliquis.  On arrival to the ED HR 130, A. fib with RVR with CHA2DS2-VASc score 3.  S/p diltiazem, metoprolol and then started on a amiodarone gtt. Seen by cardiology who restarted his Coreg.  For anticoagulation he was initially started on heparin gtt and converted to Eliquis. Echo: EF 55 to 60%, moderately dilated left ventricle with mild LVH. Cardiology's recommendations on discharge was to continue current regimen and follow up outpatient.  Patient remained in sinus rhythm throughout the admission.  Elevated troponins Elevated troponins 99> 96. Likely secondary to ESRD plus A. fib with RVR.  Cardiology felt this was not related to acute ischemia.   Hypertension Hydralazine was held in setting of soft blood pressures initially. His BP gradually increased throughout admission to Q000111Q systolic. He was discharged back on his home regimen.  Dysphagia  Globus sensation Evaluated by SLP who felt dysphagia was likely functional.  Recommended thin liquid diet and recommended outpatient follow-up with GI given history of esophagitis and hiatal hernia per endoscopy in 2015.  Other problems were chronic and stable

## 2020-12-20 NOTE — Discharge Summary (Signed)
Jeremy Mccoy  Patient name: Jeremy Mccoy Medical record number: [id number] Date of birth: 04-04-1966 Age: 55 y.o. Gender: male Date of Admission: 12/18/2020  Date of Discharge: 12/20/20 Admitting Physician: Eppie Gibson, MD  Primary Care Provider: Patient, No Pcp Per (Inactive) Consultants: Cardiology, nephrology  Indication for Hospitalization: A. fib with RVR  Discharge Diagnoses/Problem List:  A. Fib HFrEF Dysphagia Hypertension GERD ESRD MWF HD   Disposition: Home  Discharge Condition: Medically stable for discharge   Discharge Exam:  Blood pressure (!) 151/95, pulse 79, temperature (!) 96.6 F (35.9 C), temperature source Axillary, resp. rate 14, height 6' (1.829 m), weight 75.7 kg, SpO2 95 %. Physical Exam: General: Alert, no acute distress Cardio: Normal S1 and S2, RRR, no r/m/g Pulm: CTAB, normal work of breathing Abdomen: Bowel sounds normal. Abdomen soft and non-tender. Extremities: No peripheral edema. Neuro: Cranial nerves grossly intact   Brief Hospital Course:  Jeremy Mccoy is a 55 y.o. male presenting with palpitations, found to be in A Fib with RVR. PMH is significant for A Fib previously on Coreg, Amiodarone, and Eliquis; HFrEF; COPD; ESRD on HD MWF, history of polysubstance abuse.     A. fib with RVR Patient was not admitted with 1 week history of palpitations in the setting of medication noncompliance.  He reports he was not taking his Coreg or Eliquis.  On arrival to the ED HR 130, A. fib with RVR with CHA2DS2-VASc score 3.  S/p diltiazem, metoprolol and then started on a amiodarone gtt. Seen by cardiology who restarted his Coreg.  For anticoagulation he was initially started on heparin gtt and converted to Eliquis. Echo: EF 55 to 60%, moderately dilated left ventricle with mild LVH. Cardiology's recommendations on discharge was to continue current regimen and follow up outpatient.  Patient remained in  sinus rhythm throughout the admission.  Elevated troponins Elevated troponins 99> 96. Likely secondary to ESRD plus A. fib with RVR.  Cardiology felt this was not related to acute ischemia.   Hypertension Hydralazine was held in setting of soft blood pressures initially. His BP gradually increased throughout admission to Q000111Q systolic. He was discharged back on his home regimen.  Dysphagia  Globus sensation Evaluated by SLP who felt dysphagia was likely functional.  Recommended thin liquid diet and recommended outpatient follow-up with GI given history of esophagitis and hiatal hernia per endoscopy in 2015.  Other problems were chronic and stable   Issues for Follow Up:  Ensure medication compliance with Coreg and Eliquis Ensure Cardiology follow up  Recommended for outpatient GI follow up for dysphagia/globus sensation Recheck BP and adjust medications as necessary. D/c back on home regimen.   Significant Procedures:   Significant Labs and Imaging:  Recent Labs  Lab 12/18/20 1441 12/19/20 0459 12/20/20 0013  WBC 4.9 5.0 4.6  HGB 11.8* 10.4* 10.0*  HCT 36.9* 32.8* 31.1*  PLT 174 166 165   Recent Labs  Lab 12/18/20 1441 12/19/20 0459  NA 136 132*  K 3.4* 4.2  CL 92* 92*  CO2 30 26  GLUCOSE 97 77  BUN 34* 49*  CREATININE 8.66* 10.58*  CALCIUM 9.7 9.1  MG 2.3  --   PHOS  --  7.5*  7.4*  ALKPHOS 89  --   AST 12*  --   ALT 13  --   ALBUMIN 3.5 3.1*   Echo 8/7 IMPRESSIONS   1. Left ventricular ejection fraction, by estimation, is 55 to 60%. The  left ventricle has normal function. The left ventricle has no regional  wall motion abnormalities. The left ventricular internal cavity size was  mildly to moderately dilated. There  is mild left ventricular hypertrophy. Left ventricular diastolic  parameters are indeterminate.   2. Right ventricular systolic function is mildly reduced. The right  ventricular size is normal.   3. Left atrial size was severely  dilated.   4. Right atrial size was moderately dilated.   5. Peak and mean gradients through the MV are 17 and 6 mm Hg respectively  consistent with moderate mitral stenosis Compared to echo report from  September 2021, no significant change . Mild mitral valve regurgitation.  Severe mitral annular calcification.   6. Aortic valve regurgitation is trivial. Mild to moderate aortic valve  sclerosis/calcification is present, without any evidence of aortic  stenosis.   7. The inferior vena cava is dilated in size with <50% respiratory  variability, suggesting right atrial pressure of 15 mmHg.    Results/Tests Pending at Time of Discharge: None  Discharge Medications:  Allergies as of 12/20/2020   No Known Allergies      Medication List     STOP taking these medications    amiodarone 200 MG tablet Commonly known as: PACERONE   feeding supplement (NEPRO CARB STEADY) Liqd       TAKE these medications    acetaminophen 500 MG tablet Commonly known as: TYLENOL Take 1 tablet (500 mg total) by mouth every 8 (eight) hours as needed for mild pain or fever. 6 times a day, 500 mg, 12 to 24 tabs a day, 6,000 to 12,000 mg daily for pain What changed:  how much to take additional instructions Another medication with the same name was removed. Continue taking this medication, and follow the directions you see here.   albuterol 108 (90 Base) MCG/ACT inhaler Commonly known as: VENTOLIN HFA Inhale 3-6 puffs into the lungs every 6 (six) hours as needed for wheezing or shortness of breath.   apixaban 5 MG Tabs tablet Commonly known as: ELIQUIS Take 1 tablet (5 mg total) by mouth 2 (two) times daily. What changed:  medication strength how much to take   bismuth subsalicylate [id number] suspension Commonly known as: PEPTO BISMOL Take 30 mLs by mouth every 6 (six) hours as needed for indigestion or diarrhea or loose stools.   carvedilol 3.125 MG tablet Commonly known as: COREG Take 2  tablets (6.25 mg total) by mouth 2 (two) times daily with a meal. What changed: how much to take   diphenhydrAMINE 25 mg capsule Commonly known as: BENADRYL Take 25 mg by mouth every 6 (six) hours as needed for itching.   doxercalciferol 0.5 MCG capsule Commonly known as: HECTOROL Doxercalciferol (Hectorol)   famotidine 20 MG tablet Commonly known as: PEPCID Take 20 mg by mouth 2 (two) times daily as needed for heartburn or indigestion.   hydrALAZINE 25 MG tablet Commonly known as: APRESOLINE Take 1 tablet (25 mg total) by mouth 3 (three) times daily.   hydrOXYzine 25 MG tablet Commonly known as: ATARAX/VISTARIL Take 1 tablet (25 mg total) by mouth 3 (three) times daily as needed for itching.   MIRCERA IJ Mircera   pantoprazole 40 MG tablet Commonly known as: PROTONIX TAKE 1 TABLET (40 MG TOTAL) BY MOUTH DAILY. What changed: how much to take   sevelamer carbonate 800 MG tablet Commonly known as: RENVELA Take 2 tablets (1,600 mg total) by mouth 3 (three) times daily with meals. What  changed:  how much to take when to take this additional instructions   umeclidinium bromide 62.5 MCG/INH Aepb Commonly known as: INCRUSE ELLIPTA Inhale 1 puff into the lungs daily.       ASK your doctor about these medications    Dialyvite/Zinc Tabs Take 1 tablet by mouth daily.        Discharge Instructions: Please refer to Patient Instructions section of EMR for full details.  Patient was counseled important signs and symptoms that should prompt return to medical care, changes in medications, dietary instructions, activity restrictions, and follow up appointments.   Follow-Up Appointments:  Follow-up Information     Minus Breeding, MD Follow up.   Specialty: Cardiology Why: Cardiology will contact you to schedule a follow up appointment. Contact information: [id number] [place] [address] [phone]         Your PCP Follow up.   Why: Please  call your insurance to establish with a PCP. It is important that you follow up and also establish care for future medication refills.                Sharion Settler, DO 12/20/2020, 9:43 AM PGY-2, [place]

## 2020-12-20 NOTE — Care Management Obs Status (Signed)
[facility] NOTIFICATION   Patient Details  Name: Jeremy Mccoy MRN: [id number] Date of Birth: Aug 15, 1965   Medicare Observation Status Notification Given:  Yes    Bartholomew Crews, RN 12/20/2020, 9:04 AM

## 2020-12-20 NOTE — Discharge Instructions (Signed)
Dear Jeremy Mccoy,  Thank you for letting us participate in your care. You were hospitalized for a rapid and irregular heart rate. This improved with medication. Your heart is now back in a normal rhythm. To prevent reoccurance, it is important that you take your medications daily as prescribed. Cardiology will contact you for a follow up outpatient. It is also very important that you establish care with a primary care provider for health maintenance, medication refills and for hospital follow up.   POST-HOSPITAL & CARE INSTRUCTIONS Take your Eliquis twice daily, do not miss any doses.  Take your Carvedilol twice daily, do not miss any doses. Establish with a Primary Care Provider Continue your blood pressure medication (Hydralazine).  Return for heart palpitations, chest pain, shortness of breath. Go to your follow up appointments (listed below)   DOCTOR'S APPOINTMENT   No future appointments.  Follow-up Information     Minus Breeding, MD Follow up.   Specialty: Cardiology Why: Cardiology will contact you to schedule a follow up appointment. Contact information: [id number] [place] [address] [phone]         Your PCP Follow up.   Why: Please call your insurance to establish with a PCP. It is important that you follow up and also establish care for future medication refills.                Take care and be well!  [facility]  [place], [place] [address] [phone]

## 2020-12-20 NOTE — Progress Notes (Signed)
Patient ID: Jeremy Mccoy, male   DOB: 31-Jul-1965, 55 y.o.   MRN: [id number] S: Feels well and wants to go home today. O:BP (!) 151/95 (BP Location: Right Wrist)   Pulse 79   Temp (!) 96.6 F (35.9 C) (Axillary)   Resp 14   Ht 6' (1.829 m)   Wt 75.7 kg   SpO2 95%   BMI 22.63 kg/m   Intake/Output Summary (Last 24 hours) at 12/20/2020 0939 Last data filed at 12/20/2020 0700 Gross per 24 hour  Intake 1048.89 ml  Output --  Net 1048.89 ml   Intake/Output: I/O last 3 completed shifts: In: 1125.9 [P.O.:956; I.V.:169.9] Out: 0   Intake/Output this shift:  No intake/output data recorded. Weight change: 1.6 kg Gen:NAD  CVS:RRR Resp: CTA Abd: +BS, soft, NT/ND Ext: no edema, LAVF +T/B  Recent Labs  Lab 12/18/20 1441 12/19/20 0459  NA 136 132*  K 3.4* 4.2  CL 92* 92*  CO2 30 26  GLUCOSE 97 77  BUN 34* 49*  CREATININE 8.66* 10.58*  ALBUMIN 3.5 3.1*  CALCIUM 9.7 9.1  PHOS  --  7.5*  7.4*  AST 12*  --   ALT 13  --    Liver Function Tests: Recent Labs  Lab 12/18/20 1441 12/19/20 0459  AST 12*  --   ALT 13  --   ALKPHOS 89  --   BILITOT 0.4  --   PROT 7.4  --   ALBUMIN 3.5 3.1*   No results for input(s): LIPASE, AMYLASE in the last 168 hours. No results for input(s): AMMONIA in the last 168 hours. CBC: Recent Labs  Lab 12/18/20 1441 12/19/20 0459 12/20/20 0013  WBC 4.9 5.0 4.6  NEUTROABS 3.4  --   --   HGB 11.8* 10.4* 10.0*  HCT 36.9* 32.8* 31.1*  MCV 99.2 99.7 97.2  PLT 174 166 165   Cardiac Enzymes: No results for input(s): CKTOTAL, CKMB, CKMBINDEX, TROPONINI in the last 168 hours. CBG: No results for input(s): GLUCAP in the last 168 hours.  Iron Studies: No results for input(s): IRON, TIBC, TRANSFERRIN, FERRITIN in the last 72 hours. Studies/Results: DG Chest 2 View  Result Date: 12/18/2020 CLINICAL DATA:  Chest pain and palpitations. EXAM: CHEST - 2 VIEW COMPARISON:  Multiple prior studies.  The most recent is 03/29/2020 FINDINGS: The cardiac  silhouette, mediastinal and hilar contours are stable. Prominent pulmonary hila and calcifications likely related to remote granulomatous disease. Stable scattered bilateral calcified granulomas. Chronic reticulonodular interstitial pattern in the lungs likely process such as respiratory bronchiolitis. No focal infiltrates, superimposed pulmonary edema, pleural effusions or pneumothorax. The bony thorax is intact. IMPRESSION: Stable chronic lung changes. No acute pulmonary findings. Electronically Signed   By: Marijo Sanes M.D.   On: 12/18/2020 15:31   ECHOCARDIOGRAM COMPLETE  Result Date: 12/19/2020    ECHOCARDIOGRAM REPORT   Patient Name:   Jeremy Mccoy [facility] Date of Exam: 12/19/2020 Medical Rec #:  [id number]        Height:       72.0 in Accession #:    [id number]       Weight:       163.4 lb Date of Birth:  04/02/1966        BSA:          1.955 m Patient Age:    89 years         BP:           149/109 mmHg Patient Gender: M  HR:           85 bpm. Exam Location:  Inpatient Procedure: 2D Echo, Cardiac Doppler, Color Doppler and 3D Echo Indications:    R06.02 SOB  History:        Patient has prior history of Echocardiogram examinations, most                 recent 03/31/2020. Arrythmias:Atrial Fibrillation and Atrial                 Flutter.  Sonographer:    Merrie Roof RDCS Referring Phys: [place]  1. Left ventricular ejection fraction, by estimation, is 55 to 60%. The left ventricle has normal function. The left ventricle has no regional wall motion abnormalities. The left ventricular internal cavity size was mildly to moderately dilated. There is mild left ventricular hypertrophy. Left ventricular diastolic parameters are indeterminate.  2. Right ventricular systolic function is mildly reduced. The right ventricular size is normal.  3. Left atrial size was severely dilated.  4. Right atrial size was moderately dilated.  5. Peak and mean gradients through the MV are 17 and 6  mm Hg respectively consistent with moderate mitral stenosis Compared to echo report from September 2021, no significant change . Mild mitral valve regurgitation. Severe mitral annular calcification.  6. Aortic valve regurgitation is trivial. Mild to moderate aortic valve sclerosis/calcification is present, without any evidence of aortic stenosis.  7. The inferior vena cava is dilated in size with <50% respiratory variability, suggesting right atrial pressure of 15 mmHg. FINDINGS  Left Ventricle: Left ventricular ejection fraction, by estimation, is 55 to 60%. The left ventricle has normal function. The left ventricle has no regional wall motion abnormalities. 3D left ventricular ejection fraction analysis performed but not reported based on interpreter judgement due to suboptimal quality. The left ventricular internal cavity size was mildly to moderately dilated. There is mild left ventricular hypertrophy. Left ventricular diastolic parameters are indeterminate. Right Ventricle: The right ventricular size is normal. Right vetricular wall thickness was not assessed. Right ventricular systolic function is mildly reduced. Left Atrium: Left atrial size was severely dilated. Right Atrium: Right atrial size was moderately dilated. Pericardium: There is no evidence of pericardial effusion. Mitral Valve: Peak and mean gradients through the MV are 17 and 6 mm Hg respectively consistent with moderate mitral stenosis Compared to echo report from September 2021, no significant change. There is moderate thickening of the mitral valve leaflet(s).  Severe mitral annular calcification. Mild mitral valve regurgitation. MV peak gradient, 17.3 mmHg. The mean mitral valve gradient is 6.0 mmHg. Tricuspid Valve: The tricuspid valve is normal in structure. Tricuspid valve regurgitation is trivial. Aortic Valve: Aortic valve regurgitation is trivial. Mild to moderate aortic valve sclerosis/calcification is present, without any evidence of  aortic stenosis. Aortic valve mean gradient measures 3.0 mmHg. Aortic valve peak gradient measures 6.2 mmHg. Aortic valve area, by VTI measures 2.88 cm. Pulmonic Valve: The pulmonic valve was normal in structure. Pulmonic valve regurgitation is trivial. Aorta: The aortic root and ascending aorta are structurally normal, with no evidence of dilitation. Venous: The inferior vena cava is dilated in size with less than 50% respiratory variability, suggesting right atrial pressure of 15 mmHg. IAS/Shunts: No atrial level shunt detected by color flow Doppler.  LEFT VENTRICLE PLAX 2D LVIDd:         5.70 cm LVIDs:         3.30 cm LV PW:  1.30 cm LV IVS:        1.10 cm LVOT diam:     2.20 cm      3D Volume EF: LV SV:         67           3D EF:        49 % LV SV Index:   34           LV EDV:       128 ml LVOT Area:     3.80 cm     LV ESV:       65 ml                             LV SV:        63 ml  LV Volumes (MOD) LV vol d, MOD A2C: 122.0 ml LV vol d, MOD A4C: 125.0 ml LV vol s, MOD A2C: 59.0 ml LV vol s, MOD A4C: 51.7 ml LV SV MOD A2C:     63.0 ml LV SV MOD A4C:     125.0 ml LV SV MOD BP:      70.6 ml RIGHT VENTRICLE RV Basal diam:  4.40 cm RV Mid diam:    2.90 cm LEFT ATRIUM              Index       RIGHT ATRIUM           Index LA diam:        5.80 cm  2.97 cm/m  RA Area:     23.90 cm LA Vol (A2C):   135.0 ml 69.05 ml/m RA Volume:   76.30 ml  39.03 ml/m LA Vol (A4C):   168.0 ml 85.93 ml/m LA Biplane Vol: 154.0 ml 78.77 ml/m  AORTIC VALVE AV Area (Vmax):    2.80 cm AV Area (Vmean):   2.79 cm AV Area (VTI):     2.88 cm AV Vmax:           125.00 cm/s AV Vmean:          83.900 cm/s AV VTI:            0.234 m AV Peak Grad:      6.2 mmHg AV Mean Grad:      3.0 mmHg LVOT Vmax:         92.00 cm/s LVOT Vmean:        61.600 cm/s LVOT VTI:          0.177 m LVOT/AV VTI ratio: 0.76  AORTA Ao Root diam: 3.40 cm Ao Asc diam:  3.00 cm MITRAL VALVE MV Area (PHT): 3.63 cm     SHUNTS MV Area VTI:   0.96 cm     Systemic  VTI:  0.18 m MV Peak grad:  17.3 mmHg    Systemic Diam: 2.20 cm MV Mean grad:  6.0 mmHg MV Vmax:       2.08 m/s MV Vmean:      113.0 cm/s MV Decel Time: 209 msec MV E velocity: 193.00 cm/s MV A velocity: 106.00 cm/s MV E/A ratio:  1.82 Dorris Carnes MD Electronically signed by Dorris Carnes MD Signature Date/Time: 12/19/2020/5:20:04 PM    Final     apixaban  5 mg Oral BID   carvedilol  6.25 mg Oral BID WC   multivitamin  1 tablet Oral Daily   nicotine  14 mg Transdermal Daily  pantoprazole  40 mg Oral Daily   sevelamer carbonate  1,600 mg Oral TID WC   umeclidinium bromide  1 puff Inhalation Daily    BMET    Component Value Date/Time   NA 132 (L) 12/19/2020 0459   NA 142 09/12/2017 1102   K 4.2 12/19/2020 0459   CL 92 (L) 12/19/2020 0459   CO2 26 12/19/2020 0459   GLUCOSE 77 12/19/2020 0459   BUN 49 (H) 12/19/2020 0459   BUN 47 (H) 09/12/2017 1102   CREATININE 10.58 (H) 12/19/2020 0459   CALCIUM 9.1 12/19/2020 0459   GFRNONAA 5 (L) 12/19/2020 0459   GFRAA 9 (L) 01/23/2020 0858   CBC    Component Value Date/Time   WBC 4.6 12/20/2020 0013   RBC 3.20 (L) 12/20/2020 0013   HGB 10.0 (L) 12/20/2020 0013   HGB 10.2 (L) 04/06/2020 1404   HCT 31.1 (L) 12/20/2020 0013   HCT 33.0 (L) 04/06/2020 1404   PLT 165 12/20/2020 0013   PLT 251 04/06/2020 1404   MCV 97.2 12/20/2020 0013   MCV 88 04/06/2020 1404   MCH 31.3 12/20/2020 0013   MCHC 32.2 12/20/2020 0013   RDW 15.6 (H) 12/20/2020 0013   RDW 19.5 (H) 04/06/2020 1404   LYMPHSABS 0.9 12/18/2020 1441   MONOABS 0.4 12/18/2020 1441   EOSABS 0.1 12/18/2020 1441   BASOSABS 0.0 12/18/2020 1441     Dialysis Orders: Center: [facility]  on MWF . EDW 71 kg HD Bath 1K/2.5Ca  Time 4 hrs Heparin none. Access LUE AVF BFR 400 DFR A1.5    Hectoral 7 mcg IV/HD, micera 200 mcg IV/HD   Other Parsabiv 15 mg IVP TIW   Assessment/Plan:  Atrial Fibrillation with RVR - already in NSR after given metoprolol (Dilt contraindicated in CHF).   Cardiology following and restarted eliquis and coreg.  ECHO unchanged from prior study.   ESRD -  continue with HD MWF  Hypertension/volume  - stable  Anemia  - stable  Metabolic bone disease -   continue with binders and renal diet.  Nutrition - renal diet. Disposition - likely discharge to home today.  No changes to dialysis prescription  Donetta Potts, MD [facility] [phone]

## 2020-12-20 NOTE — Care Management CC44 (Signed)
Condition Code 44 Documentation Completed  Patient Details  Name: Jeremy Mccoy MRN: [id number] Date of Birth: [birth year]   Condition Code 44 given:  Yes Patient signature on Condition Code 44 notice:  Yes Documentation of 2 MD's agreement:  Yes Code 44 added to claim:  Yes    Bartholomew Crews, RN 12/20/2020, 9:04 AM

## 2020-12-21 ENCOUNTER — Other Ambulatory Visit: Payer: Self-pay | Admitting: Family Medicine

## 2020-12-21 ENCOUNTER — Telehealth: Payer: Self-pay | Admitting: Nurse Practitioner

## 2020-12-21 NOTE — Telephone Encounter (Signed)
Transition of care contact from inpatient facility  Date of Discharge:  Date of Contact: Method of contact: Phone  Attempted to contact patient to discuss transition of care from inpatient admission. Patient did not answer the phone. Message was left on the patient's voicemail with call back number [phone].

## 2020-12-24 ENCOUNTER — Other Ambulatory Visit: Payer: Self-pay | Admitting: Family Medicine

## 2020-12-24 DIAGNOSIS — I480 Paroxysmal atrial fibrillation: Secondary | ICD-10-CM

## 2020-12-27 NOTE — Progress Notes (Signed)
Cardiology Clinic Note   Patient Name: Jeremy Mccoy Date of Encounter: 12/28/2020  Primary Care Provider:  Patient, No Pcp Per (Inactive) Primary Cardiologist:  Fransico Him, MD  Patient Profile    Jeremy Mccoy 55 year old male presents to the clinic today for follow-up evaluation of his atrial fibrillation  Past Medical History    Past Medical History:  Diagnosis Date   Anemia    Anxiety    Arthritis    "qwhere" (05/15/2013)   CHF (congestive heart failure) (HCC)    COPD (chronic obstructive pulmonary disease) ([place])    Crack cocaine use    Dental caries    Depression    ESRD (end stage renal disease) on dialysis ([place])    TTS: [address] [place] (05/15/2013)   Headache(784.0)    "get it from going to dialysis; when they get real bad I come to hospital" (05/15/2013)   Hematemesis/vomiting blood    History of blood transfusion 10/2011; 04/2013   HTN (hypertension) 06/12/2012   Seizures ([place])    Shortness of breath    "just when I have too much potassium is too high" (05/15/2013)   Past Surgical History:  Procedure Laterality Date   AV FISTULA PLACEMENT  11/29/2011   Procedure: ARTERIOVENOUS (AV) FISTULA CREATION;  Surgeon: Angelia Mould, MD;  Location: [facility];  Service: Vascular;  Laterality: Left;   CARDIOVERSION N/A 03/31/2020   Procedure: CARDIOVERSION;  Surgeon: Lelon Perla, MD;  Location: [facility] ENDOSCOPY;  Service: Cardiovascular;  Laterality: N/A;   ESOPHAGOGASTRODUODENOSCOPY N/A 05/17/2013   Procedure: ESOPHAGOGASTRODUODENOSCOPY (EGD);  Surgeon: Beryle Beams, MD;  Location: [facility] ENDOSCOPY;  Service: Endoscopy;  Laterality: N/A;   INGUINAL HERNIA REPAIR Bilateral 1967   IR FLUORO GUIDE CV LINE RIGHT  10/20/2017   IR THROMBECTOMY AV FISTULA W/THROMBOLYSIS/PTA INC/SHUNT/IMG LEFT Left 10/24/2017   IR US GUIDE VASC ACCESS LEFT  10/24/2017   IR US GUIDE VASC ACCESS RIGHT  10/20/2017   RENAL BIOPSY  11/07/2011   TEE WITHOUT CARDIOVERSION N/A 03/31/2020    Procedure: TRANSESOPHAGEAL ECHOCARDIOGRAM (TEE);  Surgeon: Lelon Perla, MD;  Location: [facility] ENDOSCOPY;  Service: Cardiovascular;  Laterality: N/A;    Allergies  No Known Allergies  History of Present Illness    Jeremy Mccoy has a PMH of atrial fibrillation HFrEF, dysphagia, HTN, GERD, COPD, ESRD on HD Monday Wednesday Friday, and polysubstance abuse.  He presented to the emergency department on 12/18/2020 and was discharged on 12/20/2020.  He reported 1 week of palpitations in the setting of medication noncompliance.  He had not been taking his carvedilol or his apixaban.  On presentation to the emergency department his heart rate was 130 bpm and his EKG showed atrial fibrillation with RVR.  His CHA2DS2-VASc score is 3.  He was treated with diltiazem, metoprolol started on amiodarone drip.  He was restarted on his carvedilol by cardiology.  He received IV heparin and was converted to apixaban.  His echocardiogram showed an LVEF of 55-60%, moderately dilated left ventricle with mild LVH.  He transitioned to sinus rhythm and was discharged in sinus rhythm.  His troponins were mildly elevated at 99 and 96.  This was felt to be related to his end-stage renal disease and his atrial fibrillation with RVR.  No further plans for ischemic evaluation or made.  His blood pressure was initially well.  His hydralazine was held and his blood pressure returned to its baseline.  He was placed back on his home medication regimen.  He presents the clinic today for follow-up evaluation states he feels well.  He has had 1 episode where he noted his heart rate increased in the setting of increased stress.  He has not had any further episodes of increased or irregular heart rate since that time.  He reports compliance with his medications and his hemodialysis.  We will continue his current medications and have him follow-up in 3 to 4 months.    Today he denies chest pain, shortness of breath, lower extremity edema,  fatigue, palpitations, melena, hematuria, hemoptysis, diaphoresis, weakness, presyncope, syncope, orthopnea, and PND.   Home Medications    Prior to Admission medications   Medication Sig Start Date End Date Taking? Authorizing Provider  acetaminophen (TYLENOL) 500 MG tablet Take 1 tablet (500 mg total) by mouth every 8 (eight) hours as needed for mild pain or fever. 6 times a day, 500 mg, 12 to 24 tabs a day, 6,000 to 12,000 mg daily for pain 08/05/16   Molt, Bethany, DO  albuterol (VENTOLIN HFA) 108 (90 Base) MCG/ACT inhaler Inhale 3-6 puffs into the lungs every 6 (six) hours as needed for wheezing or shortness of breath.     [provider]  apixaban (ELIQUIS) 5 MG TABS tablet Take 1 tablet (5 mg total) by mouth 2 (two) times daily. 12/20/20 01/19/21  Sharion Settler, DO  B Complex-C-Zn-Folic Acid (DIALYVITE/ZINC) TABS Take 1 tablet by mouth daily.  Patient not taking: Reported on 12/18/2020 09/11/19   [provider]  bismuth subsalicylate (PEPTO BISMOL) 262 MG/15ML suspension Take 30 mLs by mouth every 6 (six) hours as needed for indigestion or diarrhea or loose stools.    [provider]  carvedilol (COREG) 3.125 MG tablet Take 2 tablets (6.25 mg total) by mouth 2 (two) times daily with a meal. 12/20/20   Sharion Settler, DO  diphenhydrAMINE (BENADRYL) 25 mg capsule Take 25 mg by mouth every 6 (six) hours as needed for itching.    [provider]  doxercalciferol (HECTOROL) 0.5 MCG capsule Doxercalciferol (Hectorol) 08/05/20 08/04/21  [provider]  famotidine (PEPCID) 20 MG tablet Take 20 mg by mouth 2 (two) times daily as needed for heartburn or indigestion.    [provider]  hydrALAZINE (APRESOLINE) 25 MG tablet Take 1 tablet (25 mg total) by mouth 3 (three) times daily. 05/26/20   Richardson Dopp T, PA-C  hydrOXYzine (ATARAX/VISTARIL) 25 MG tablet Take 1 tablet (25 mg total) by mouth 3 (three) times daily as needed for itching. 04/01/20    Ezequiel Essex, MD  Methoxy PEG-Epoetin Beta (MIRCERA IJ) Mircera 06/15/20 06/14/21  [provider]  pantoprazole (PROTONIX) 40 MG tablet TAKE 1 TABLET (40 MG TOTAL) BY MOUTH DAILY. 04/01/20 04/01/21  Ezequiel Essex, MD  sevelamer carbonate (RENVELA) 800 MG tablet Take 2 tablets (1,600 mg total) by mouth 3 (three) times daily with meals. 01/25/14   McLean-Scocuzza, Nino Glow, MD  Tiotropium Bromide Monohydrate (SPIRIVA RESPIMAT) 2.5 MCG/ACT AERS Take 2 puffs by mouth daily. 12/21/20 01/20/21  Sharion Settler, DO    Family History    Family History  Problem Relation Age of Onset   Heart attack Father    He indicated that his mother is alive. He indicated that his father is deceased. He indicated that his sister is alive. He indicated that his brother is alive.  Social History    Social History   Socioeconomic History   Marital status: Single    Spouse name: Not on file   Number of  children: 0   Years of education: 9 th   Highest education level: Not on file  Occupational History   Occupation: Corporate treasurer    Comment: Disabled  Tobacco Use   Smoking status: Every Day    Packs/day: 1.00    Years: 50.00    Pack years: 50.00    Types: Cigars, Cigarettes   Smokeless tobacco: Never  Vaping Use   Vaping Use: Never used  Substance and Sexual Activity   Alcohol use: No    Comment: 05/15/2013 "aien't drank in ~ 25 yrs"   Drug use: Yes    Types: Marijuana, Cocaine    Comment: Precription drugs (e.g. percocet) "I take what I have to to controll my constant pain" (05/15/2013)   Sexual activity: Never  Other Topics Concern   Not on file  Social History Narrative   ** Merged History Encounter **   Patient lives at home with his parents and he is disabled.   Education 9th grade education   Right handed   Caffeine one cup daily       Social Determinants of Health   Financial Resource Strain: Not on file  Food Insecurity: Not on file  Transportation Needs: Not on  file  Physical Activity: Not on file  Stress: Not on file  Social Connections: Not on file  Intimate Partner Violence: Not on file     Review of Systems    General:  No chills, fever, night sweats or weight changes.  Cardiovascular:  No chest pain, dyspnea on exertion, edema, orthopnea, palpitations, paroxysmal nocturnal dyspnea. Dermatological: No rash, lesions/masses Respiratory: No cough, dyspnea Urologic: No hematuria, dysuria Abdominal:   No nausea, vomiting, diarrhea, bright red blood per rectum, melena, or hematemesis Neurologic:  No visual changes, wkns, changes in mental status. All other systems reviewed and are otherwise negative except as noted above.  Physical Exam    VS:  BP 110/76   Pulse 84   Resp 18   Ht 6' (1.829 m)   Wt 159 lb 6.4 oz (72.3 kg)   SpO2 95%   BMI 21.62 kg/m  , BMI Body mass index is 21.62 kg/m. GEN: Well nourished, well developed, in no acute distress. HEENT: normal. Neck: Supple, no JVD, carotid bruits, or masses. Cardiac: RRR, no murmurs, rubs, or gallops. No clubbing, cyanosis, edema.  Radials/DP/PT 2+ and equal bilaterally.  Respiratory:  Respirations regular and unlabored, clear to auscultation bilaterally. GI: Soft, nontender, nondistended, BS + x 4. MS: no deformity or atrophy. Skin: warm and dry, no rash. Neuro:  Strength and sensation are intact. Psych: Normal affect.  Accessory Clinical Findings    Recent Labs: 01/20/2020: B Natriuretic Peptide >4,500.0 05/26/2020: TSH 1.630 12/18/2020: ALT 13; Magnesium 2.3 12/19/2020: BUN 49; Creatinine, Ser 10.58; Potassium 4.2; Sodium 132 12/20/2020: Hemoglobin 10.0; Platelets 165   Recent Lipid Panel    Component Value Date/Time   CHOL 76 12/19/2020 0459   TRIG 76 12/19/2020 0459   HDL 34 (L) 12/19/2020 0459   CHOLHDL 2.2 12/19/2020 0459   VLDL 15 12/19/2020 0459   LDLCALC 27 12/19/2020 0459    ECG personally reviewed by me today-normal sinus rhythm possible left atrial enlargement  left axis deviation inferior infarct undetermined age/anterior infarct undetermined age T wave abnormality consider lateral ischemia 79 bpm- No acute changes  Echocardiogram 12/20/2018 IMPRESSIONS     1. Left ventricular ejection fraction, by estimation, is 55 to 60%. The  left ventricle has normal function. The left ventricle has no  regional  wall motion abnormalities. The left ventricular internal cavity size was  mildly to moderately dilated. There  is mild left ventricular hypertrophy. Left ventricular diastolic  parameters are indeterminate.   2. Right ventricular systolic function is mildly reduced. The right  ventricular size is normal.   3. Left atrial size was severely dilated.   4. Right atrial size was moderately dilated.   5. Peak and mean gradients through the MV are 17 and 6 mm Hg respectively  consistent with moderate mitral stenosis Compared to echo report from  September 2021, no significant change . Mild mitral valve regurgitation.  Severe mitral annular calcification.   6. Aortic valve regurgitation is trivial. Mild to moderate aortic valve  sclerosis/calcification is present, without any evidence of aortic  stenosis.   7. The inferior vena cava is dilated in size with <50% respiratory  variability, suggesting right atrial pressure of 15 mmHg.  Assessment & Plan   1.  Atrial fibrillation-heart rate today 84 BPM.  Reports compliance with this medication.  Denies bleeding issues. Continue apixaban, carvedilol Heart healthy low-sodium diet-salty 6 given Increase physical activity as tolerated  Elevated troponins-during recent ED visit troponins were elevated at 99 and 96.  It was felt to be related to his end-stage renal disease and his atrial fibrillation with RVR.  No plans for ischemic evaluation were made.  Continues to deny chest pain, DOE, and increased activity tolerance. Heart healthy low-sodium diet Increase physical activity as tolerated  Essential  hypertension-BP today 110/76.  Well-controlled at home. Continue carvedilol Heart healthy low-sodium diet-salty 6 given Increase physical activity as tolerated  End-stage renal disease-reports compliance with hemodialysis. Continue hemodialysis Monday Wednesday Friday Follows with nephrology   Disposition: Follow-up with Dr. Radford Pax or APP in 3-4 months.  Jossie Ng. Rakeisha Nyce NP-C    12/28/2020, 11:59 AM [address] Office [phone] Fax [phone]  Notice: This dictation was prepared with Dragon dictation along with smaller phrase technology. Any transcriptional errors that result from this process are unintentional and may not be corrected upon review.  I spent 14 minutes examining this patient, reviewing medications, and using patient centered shared decision making involving her cardiac care.  Prior to her visit I spent greater than 20 minutes reviewing her past medical history,  medications, and prior cardiac tests.

## 2020-12-28 ENCOUNTER — Ambulatory Visit (INDEPENDENT_AMBULATORY_CARE_PROVIDER_SITE_OTHER): Payer: Medicare HMO | Admitting: General Practice

## 2020-12-28 ENCOUNTER — Other Ambulatory Visit: Payer: Self-pay

## 2020-12-28 ENCOUNTER — Encounter: Payer: Self-pay | Admitting: General Practice

## 2020-12-28 ENCOUNTER — Ambulatory Visit: Payer: Medicare HMO | Admitting: Cardiology

## 2020-12-28 VITALS — BP 110/76 | HR 84 | Resp 18 | Ht 72.0 in | Wt 159.4 lb

## 2020-12-28 DIAGNOSIS — R778 Other specified abnormalities of plasma proteins: Secondary | ICD-10-CM

## 2020-12-28 DIAGNOSIS — I4892 Unspecified atrial flutter: Secondary | ICD-10-CM

## 2020-12-28 DIAGNOSIS — I10 Essential (primary) hypertension: Secondary | ICD-10-CM

## 2020-12-28 DIAGNOSIS — N186 End stage renal disease: Secondary | ICD-10-CM

## 2020-12-28 NOTE — Patient Instructions (Signed)
Medication Instructions:  The current medical regimen is effective;  continue present plan and medications as directed. Please refer to the Current Medication list given to you today.   *If you need a refill on your cardiac medications before your next appointment, please call your pharmacy*  Lab Work:   Testing/Procedures:  NONE    NONE  Follow-Up: Your next appointment:  3-4 month(s) In Person with You may see Fransico Him, MD or SCOTT WEAVER, PA-C  At [facility], you and your health needs are our priority.  As part of our continuing mission to provide you with exceptional heart care, we have created designated Provider Care Teams.  These Care Teams include your primary Cardiologist (physician) and Advanced Practice Providers (APPs -  Physician Assistants and Nurse Practitioners) who all work together to provide you with the care you need, when you need it.  We recommend signing up for the patient portal called "MyChart".  Sign up information is provided on this After Visit Summary.  MyChart is used to connect with patients for Virtual Visits (Telemedicine).  Patients are able to view lab/test results, encounter notes, upcoming appointments, etc.  Non-urgent messages can be sent to your provider as well.   To learn more about what you can do with MyChart, go to [email or web address].

## 2021-01-16 ENCOUNTER — Other Ambulatory Visit: Payer: Self-pay | Admitting: Family Medicine

## 2021-01-16 DIAGNOSIS — I480 Paroxysmal atrial fibrillation: Secondary | ICD-10-CM

## 2021-01-28 ENCOUNTER — Other Ambulatory Visit: Payer: Self-pay | Admitting: *Deleted

## 2021-01-28 ENCOUNTER — Telehealth: Payer: Self-pay | Admitting: General Practice

## 2021-01-28 DIAGNOSIS — I480 Paroxysmal atrial fibrillation: Secondary | ICD-10-CM

## 2021-01-28 MED ORDER — APIXABAN 5 MG PO TABS: 5.0000 mg | ORAL_TABLET | Freq: Two times a day (BID) | ORAL | 3 refills | Status: AC

## 2021-01-28 NOTE — Telephone Encounter (Signed)
  Patient c/o Palpitations:  High priority if patient c/o lightheadedness, shortness of breath, or chest pain  How long have you had palpitations/irregular HR/ Afib? Are you having the symptoms now? Yes  Are you currently experiencing lightheadedness, SOB or CP? none  Do you have a history of afib (atrial fibrillation) or irregular heart rhythm? Yes   Have you checked your BP or HR? (document readings if available): yesterday upper number BP was 200 today its 142  Are you experiencing any other symptoms? Pt said yesterday while in dialysis his BP went up to 200 for the upper number he couldn't remember the below number but he said it was around 100 something, he said his heart beat was skipping and fluctuating, he was told to go to the hospital but he went home and sleep the whole day, but at night his HR keeps waking him up. He said he was told by Coletta Memos to call him if he had any symptoms

## 2021-01-28 NOTE — Telephone Encounter (Signed)
Lm to call back ./cy 

## 2021-01-28 NOTE — Telephone Encounter (Signed)
Spoke with pt and is needing refill on Eliquis Also notes irreg heartbeat Per pt this happened yesterday during dialysis and was told to go to ED pt refused Pt calling again  today wanting an appt then stated would keep original appt and and if has another episode tomorrow during dialysis would go to ED as directed Per pt will call back if needs a sooner appt Will forward to Johnanna Schneiders NP for review ./cy

## 2021-01-29 ENCOUNTER — Emergency Department (HOSPITAL_COMMUNITY): Payer: Medicare Other

## 2021-01-29 ENCOUNTER — Other Ambulatory Visit: Payer: Self-pay

## 2021-01-29 ENCOUNTER — Observation Stay (HOSPITAL_COMMUNITY)
Admission: EM | Admit: 2021-01-29 | Discharge: 2021-01-30 | Disposition: A | Discharge status: Home / Self Care | Payer: Medicare Other | Attending: Internal Medicine | Admitting: Internal Medicine

## 2021-01-29 ENCOUNTER — Encounter (HOSPITAL_COMMUNITY): Payer: Self-pay

## 2021-01-29 DIAGNOSIS — R0602 Shortness of breath: Secondary | ICD-10-CM | POA: Diagnosis present

## 2021-01-29 DIAGNOSIS — Z7901 Long term (current) use of anticoagulants: Secondary | ICD-10-CM | POA: Diagnosis not present

## 2021-01-29 DIAGNOSIS — I4891 Unspecified atrial fibrillation: Principal | ICD-10-CM | POA: Insufficient documentation

## 2021-01-29 DIAGNOSIS — E875 Hyperkalemia: Secondary | ICD-10-CM | POA: Insufficient documentation

## 2021-01-29 DIAGNOSIS — F17210 Nicotine dependence, cigarettes, uncomplicated: Secondary | ICD-10-CM | POA: Insufficient documentation

## 2021-01-29 DIAGNOSIS — Z720 Tobacco use: Secondary | ICD-10-CM | POA: Diagnosis present

## 2021-01-29 DIAGNOSIS — D631 Anemia in chronic kidney disease: Secondary | ICD-10-CM | POA: Diagnosis not present

## 2021-01-29 DIAGNOSIS — I5032 Chronic diastolic (congestive) heart failure: Secondary | ICD-10-CM | POA: Diagnosis not present

## 2021-01-29 DIAGNOSIS — Z992 Dependence on renal dialysis: Secondary | ICD-10-CM

## 2021-01-29 DIAGNOSIS — I132 Hypertensive heart and chronic kidney disease with heart failure and with stage 5 chronic kidney disease, or end stage renal disease: Secondary | ICD-10-CM | POA: Insufficient documentation

## 2021-01-29 DIAGNOSIS — J449 Chronic obstructive pulmonary disease, unspecified: Secondary | ICD-10-CM | POA: Insufficient documentation

## 2021-01-29 DIAGNOSIS — N186 End stage renal disease: Secondary | ICD-10-CM | POA: Diagnosis not present

## 2021-01-29 DIAGNOSIS — Z20822 Contact with and (suspected) exposure to covid-19: Secondary | ICD-10-CM | POA: Diagnosis not present

## 2021-01-29 DIAGNOSIS — I340 Nonrheumatic mitral (valve) insufficiency: Secondary | ICD-10-CM

## 2021-01-29 DIAGNOSIS — D696 Thrombocytopenia, unspecified: Secondary | ICD-10-CM | POA: Diagnosis present

## 2021-01-29 DIAGNOSIS — Z79899 Other long term (current) drug therapy: Secondary | ICD-10-CM | POA: Insufficient documentation

## 2021-01-29 DIAGNOSIS — N189 Chronic kidney disease, unspecified: Secondary | ICD-10-CM | POA: Diagnosis present

## 2021-01-29 LAB — TROPONIN I (HIGH SENSITIVITY)
Troponin I (High Sensitivity): 74 ng/L — ABNORMAL HIGH (ref <18)
Troponin I (High Sensitivity): 68 ng/L — ABNORMAL HIGH (ref <18)

## 2021-01-29 LAB — CBC WITH DIFFERENTIAL/PLATELET
Abs Immature Granulocytes: 0.01 10*3/uL (ref 0.00–0.07)
Basophils Absolute: 0.1 10*3/uL (ref 0.0–0.1)
Basophils Relative: 1 %
Eosinophils Absolute: 0.1 10*3/uL (ref 0.0–0.5)
Eosinophils Relative: 1 %
HCT: 30.4 % — ABNORMAL LOW (ref 39.0–52.0)
Hemoglobin: 9.4 g/dL — ABNORMAL LOW (ref 13.0–17.0)
Immature Granulocytes: 0 %
Lymphocytes Relative: 20 %
Lymphs Abs: 1.1 10*3/uL (ref 0.7–4.0)
MCH: 31.9 pg (ref 26.0–34.0)
MCHC: 30.9 g/dL (ref 30.0–36.0)
MCV: 103.1 fL — ABNORMAL HIGH (ref 80.0–100.0)
Monocytes Absolute: 0.5 10*3/uL (ref 0.1–1.0)
Monocytes Relative: 9 %
Neutro Abs: 3.7 10*3/uL (ref 1.7–7.7)
Neutrophils Relative %: 69 %
Platelets: 141 10*3/uL — ABNORMAL LOW (ref 150–400)
RBC: 2.95 MIL/uL — ABNORMAL LOW (ref 4.22–5.81)
RDW: 16.8 % — ABNORMAL HIGH (ref 11.5–15.5)
WBC: 5.4 10*3/uL (ref 4.0–10.5)
nRBC: 0 % (ref 0.0–0.2)

## 2021-01-29 LAB — COMPREHENSIVE METABOLIC PANEL
ALT: 10 U/L (ref 0–44)
AST: 10 U/L — ABNORMAL LOW (ref 15–41)
Albumin: 3.8 g/dL (ref 3.5–5.0)
Alkaline Phosphatase: 66 U/L (ref 38–126)
Anion gap: 18 — ABNORMAL HIGH (ref 5–15)
BUN: 75 mg/dL — ABNORMAL HIGH (ref 6–20)
CO2: 25 mmol/L (ref 22–32)
Calcium: 10.7 mg/dL — ABNORMAL HIGH (ref 8.9–10.3)
Chloride: 92 mmol/L — ABNORMAL LOW (ref 98–111)
Creatinine, Ser: 13.57 mg/dL — ABNORMAL HIGH (ref 0.61–1.24)
GFR, Estimated: 4 mL/min — ABNORMAL LOW (ref >60)
Glucose, Bld: 81 mg/dL (ref 70–99)
Potassium: 5.6 mmol/L — ABNORMAL HIGH (ref 3.5–5.1)
Sodium: 135 mmol/L (ref 135–145)
Total Bilirubin: 1 mg/dL (ref 0.3–1.2)
Total Protein: 7.1 g/dL (ref 6.5–8.1)

## 2021-01-29 LAB — RESP PANEL BY RT-PCR (FLU A&B, COVID) ARPGX2
Influenza A by PCR: NEGATIVE
Influenza B by PCR: NEGATIVE
SARS Coronavirus 2 by RT PCR: NEGATIVE

## 2021-01-29 LAB — HEPATITIS B SURFACE ANTIGEN: Hepatitis B Surface Ag: NONREACTIVE

## 2021-01-29 MED ORDER — ACETAMINOPHEN 325 MG PO TABS
650.0000 mg | ORAL_TABLET | Freq: Four times a day (QID) | ORAL | Status: DC | PRN
  Administered 2021-01-30 (×2): 650 mg via ORAL
  Filled 2021-01-29 (×2): qty 2

## 2021-01-29 MED ORDER — APIXABAN 5 MG PO TABS
5.0000 mg | ORAL_TABLET | Freq: Two times a day (BID) | ORAL | Status: DC
  Administered 2021-01-29 – 2021-01-30 (×2): 5 mg via ORAL
  Filled 2021-01-29 (×2): qty 1

## 2021-01-29 MED ORDER — SEVELAMER CARBONATE 800 MG PO TABS
2400.0000 mg | ORAL_TABLET | Freq: Three times a day (TID) | ORAL | Status: DC
  Administered 2021-01-30: 2400 mg via ORAL
  Filled 2021-01-29: qty 3

## 2021-01-29 MED ORDER — HEPARIN SODIUM (PORCINE) 1000 UNIT/ML DIALYSIS: 1000.0000 [IU] | INTRAMUSCULAR | Status: DC | PRN

## 2021-01-29 MED ORDER — HYDROCORTISONE 1 % EX CREA
1.0000 "application " | TOPICAL_CREAM | Freq: Two times a day (BID) | CUTANEOUS | Status: DC | PRN
  Filled 2021-01-29: qty 28

## 2021-01-29 MED ORDER — ETOMIDATE 2 MG/ML IV SOLN
0.1000 mg/kg | Freq: Once | INTRAVENOUS | Status: AC
  Administered 2021-01-29: 7.22 mg via INTRAVENOUS
  Filled 2021-01-29: qty 10

## 2021-01-29 MED ORDER — DILTIAZEM HCL 25 MG/5ML IV SOLN
10.0000 mg | Freq: Once | INTRAVENOUS | Status: AC
  Administered 2021-01-29: 10 mg via INTRAVENOUS
  Filled 2021-01-29: qty 5

## 2021-01-29 MED ORDER — LIDOCAINE HCL (PF) 1 % IJ SOLN: 5.0000 mL | INTRAMUSCULAR | Status: DC | PRN

## 2021-01-29 MED ORDER — TOLNAFTATE 1 % EX AERP: 1.0000 "application " | INHALATION_SPRAY | Freq: Two times a day (BID) | CUTANEOUS | Status: DC | PRN

## 2021-01-29 MED ORDER — HYDROXYZINE HCL 25 MG PO TABS
50.0000 mg | ORAL_TABLET | Freq: Three times a day (TID) | ORAL | Status: DC | PRN
  Administered 2021-01-30 (×2): 50 mg via ORAL
  Filled 2021-01-29 (×3): qty 2

## 2021-01-29 MED ORDER — ALTEPLASE 2 MG IJ SOLR
2.0000 mg | Freq: Once | INTRAMUSCULAR | Status: DC | PRN
  Filled 2021-01-29: qty 2

## 2021-01-29 MED ORDER — ALBUTEROL SULFATE HFA 108 (90 BASE) MCG/ACT IN AERS: 3.0000 | INHALATION_SPRAY | Freq: Four times a day (QID) | RESPIRATORY_TRACT | Status: DC | PRN

## 2021-01-29 MED ORDER — DIPHENHYDRAMINE HCL 25 MG PO CAPS: 25.0000 mg | ORAL_CAPSULE | Freq: Four times a day (QID) | ORAL | Status: DC | PRN

## 2021-01-29 MED ORDER — ALBUTEROL SULFATE (2.5 MG/3ML) 0.083% IN NEBU: 2.5000 mg | INHALATION_SOLUTION | Freq: Four times a day (QID) | RESPIRATORY_TRACT | Status: DC | PRN

## 2021-01-29 MED ORDER — PENTAFLUOROPROP-TETRAFLUOROETH EX AERO: 1.0000 "application " | INHALATION_SPRAY | CUTANEOUS | Status: DC | PRN

## 2021-01-29 MED ORDER — CLOTRIMAZOLE 1 % EX CREA: TOPICAL_CREAM | Freq: Two times a day (BID) | CUTANEOUS | Status: DC | PRN

## 2021-01-29 MED ORDER — SODIUM ZIRCONIUM CYCLOSILICATE 10 G PO PACK
10.0000 g | PACK | Freq: Once | ORAL | Status: AC
  Administered 2021-01-29: 10 g via ORAL
  Filled 2021-01-29: qty 1

## 2021-01-29 MED ORDER — ONDANSETRON HCL 4 MG/2ML IJ SOLN: 4.0000 mg | Freq: Four times a day (QID) | INTRAMUSCULAR | Status: DC | PRN

## 2021-01-29 MED ORDER — ACETAMINOPHEN 650 MG RE SUPP: 650.0000 mg | Freq: Four times a day (QID) | RECTAL | Status: DC | PRN

## 2021-01-29 MED ORDER — UMECLIDINIUM BROMIDE 62.5 MCG/INH IN AEPB
1.0000 | INHALATION_SPRAY | Freq: Every day | RESPIRATORY_TRACT | Status: DC
  Filled 2021-01-29: qty 7

## 2021-01-29 MED ORDER — CARVEDILOL 6.25 MG PO TABS
6.2500 mg | ORAL_TABLET | Freq: Two times a day (BID) | ORAL | Status: DC
  Administered 2021-01-30: 6.25 mg via ORAL
  Filled 2021-01-29: qty 1

## 2021-01-29 MED ORDER — LIDOCAINE-PRILOCAINE 2.5-2.5 % EX CREA: 1.0000 "application " | TOPICAL_CREAM | CUTANEOUS | Status: DC | PRN

## 2021-01-29 MED ORDER — SODIUM CHLORIDE 0.9% FLUSH
3.0000 mL | Freq: Two times a day (BID) | INTRAVENOUS | Status: DC
  Administered 2021-01-29 – 2021-01-30 (×2): 3 mL via INTRAVENOUS

## 2021-01-29 MED ORDER — SODIUM CHLORIDE 0.9 % IV SOLN: 100.0000 mL | INTRAVENOUS | Status: DC | PRN

## 2021-01-29 MED ORDER — TIOTROPIUM BROMIDE MONOHYDRATE 2.5 MCG/ACT IN AERS: 2.0000 | INHALATION_SPRAY | Freq: Every day | RESPIRATORY_TRACT | Status: DC

## 2021-01-29 MED ORDER — DILTIAZEM HCL-DEXTROSE 125-5 MG/125ML-% IV SOLN (PREMIX)
5.0000 mg/h | INTRAVENOUS | Status: DC
  Administered 2021-01-29: 5 mg/h via INTRAVENOUS
  Filled 2021-01-29: qty 125

## 2021-01-29 MED ORDER — SEVELAMER CARBONATE 800 MG PO TABS: 800.0000 mg | ORAL_TABLET | ORAL | Status: DC

## 2021-01-29 MED ORDER — SEVELAMER CARBONATE 800 MG PO TABS: 800.0000 mg | ORAL_TABLET | Freq: Two times a day (BID) | ORAL | Status: DC | PRN

## 2021-01-29 MED ORDER — ONDANSETRON HCL 4 MG PO TABS: 4.0000 mg | ORAL_TABLET | Freq: Four times a day (QID) | ORAL | Status: DC | PRN

## 2021-01-29 MED ORDER — NICOTINE 21 MG/24HR TD PT24: 21.0000 mg | MEDICATED_PATCH | Freq: Every day | TRANSDERMAL | Status: DC

## 2021-01-29 MED ORDER — PANTOPRAZOLE SODIUM 40 MG PO TBEC
40.0000 mg | DELAYED_RELEASE_TABLET | Freq: Every day | ORAL | Status: DC
  Administered 2021-01-30: 40 mg via ORAL
  Filled 2021-01-29: qty 1

## 2021-01-29 NOTE — ED Provider Notes (Signed)
.  Sedation  Date/Time: 01/29/2021 5:30 PM Performed by: Godfrey Pick, MD Authorized by: Godfrey Pick, MD   Consent:    Consent obtained:  Verbal and written   Consent given by:  Patient   Risks discussed:  Allergic reaction, dysrhythmia, prolonged hypoxia resulting in organ damage, respiratory compromise necessitating ventilatory assistance and intubation and inadequate sedation Universal protocol:    Procedure explained and questions answered to patient or proxy's satisfaction: yes     Immediately prior to procedure, a time out was called: yes   Indications:    Procedure performed:  Cardioversion   Procedure necessitating sedation performed by:  Physician performing sedation Pre-sedation assessment:    Time since last food or drink:  Hours   ASA classification: class 3 - patient with severe systemic disease     Mouth opening:  3 or more finger widths   Thyromental distance:  3 finger widths   Mallampati score:  II - soft palate, uvula, fauces visible   Neck mobility: normal     Pre-sedation assessments completed and reviewed: airway patency, cardiovascular function, hydration status, mental status, nausea/vomiting, pain level and respiratory function     Pre-sedation assessment completed:  01/29/2021 5:00 PM Procedure details (see MAR for exact dosages):    Preoxygenation:  Nasal cannula   Sedation:  Etomidate   Intended level of sedation: moderate (conscious sedation)   Analgesia:  None   Intra-procedure monitoring:  Blood pressure monitoring, continuous capnometry, continuous pulse oximetry, cardiac monitor, frequent LOC assessments and frequent vital sign checks   Intra-procedure events: none     Total Provider sedation time (minutes):  15 Post-procedure details:    Post-sedation assessment completed:  01/29/2021 6:00 PM   Attendance: Constant attendance by certified staff until patient recovered     Recovery: Patient returned to pre-procedure baseline     Post-sedation assessments  completed and reviewed: airway patency, cardiovascular function, hydration status, mental status, nausea/vomiting, pain level and respiratory function     Patient is stable for discharge or admission: yes     Procedure completion:  Tolerated well, no immediate complications .Cardioversion  Date/Time: 01/29/2021 5:30 PM Performed by: Godfrey Pick, MD Authorized by: Godfrey Pick, MD   Consent:    Consent obtained:  Verbal and written   Consent given by:  Patient   Risks discussed:  Induced arrhythmia and death   Alternatives discussed:  Rate-control medication and delayed treatment Universal protocol:    Procedure explained and questions answered to patient or proxy's satisfaction: yes     Immediately prior to procedure a time out was called: yes     Patient identity confirmed:  Verbally with patient and arm band Pre-procedure details:    Cardioversion basis:  Elective   Rhythm:  Atrial fibrillation   Electrode placement:  Anterior-posterior Patient sedated: Yes. Refer to sedation procedure documentation for details of sedation.  Attempt one:    Cardioversion mode:  Synchronous   Shock (Joules):  200   Shock outcome:  Conversion to normal sinus rhythm Post-procedure details:    Patient status:  Awake   Patient tolerance of procedure:  Tolerated well, no immediate complications    Godfrey Pick, MD 01/29/21 Lurena Nida

## 2021-01-29 NOTE — Progress Notes (Signed)
ANTICOAGULATION CONSULT NOTE - Initial Consult  Pharmacy Consult for apixaban Indication: atrial fibrillation  No Known Allergies  Patient Measurements:   Vital Signs: Temp: 98.1 F (36.7 C) (09/16 1143) Temp Source: Oral (09/16 1143) BP: 137/93 (09/16 1445) Pulse Rate: 117 (09/16 1455)  Labs: Recent Labs    01/29/21 1156 01/29/21 1435  HGB 9.4*  --   HCT 30.4*  --   PLT 141*  --   CREATININE 13.57*  --   TROPONINIHS 74* 68*    CrCl cannot be calculated (Unknown ideal weight.).   Medical History: Past Medical History:  Diagnosis Date   Anemia    Anxiety    Arthritis    "qwhere" (05/15/2013)   CHF (congestive heart failure) (HCC)    COPD (chronic obstructive pulmonary disease) (HCC)    Crack cocaine use    Dental caries    Depression    ESRD (end stage renal disease) on dialysis ([place])    TTS: [address] [place] (05/15/2013)   Headache(784.0)    "get it from going to dialysis; when they get real bad I come to hospital" (05/15/2013)   Hematemesis/vomiting blood    History of blood transfusion 10/2011; 04/2013   HTN (hypertension) 06/12/2012   Seizures ([place])    Shortness of breath    "just when I have too much potassium is too high" (05/15/2013)    Medications:  (Not in a hospital admission)   Assessment: 46 YOM with h/o ESRD on HD admitted for management of Afib. On apixaban PTA but has not taken his dose today. Pharmacy consulted to resume apixaban. H/H low. Plt low.  Goal of Therapy:  Primary stroke prevention Monitor platelets by anticoagulation protocol: Yes   Plan:  -Resume home apixaban dose of 5 mg twice daily  -Monitor for s/s of bleeding.   Albertina Parr, PharmD., BCPS, BCCCP Clinical Pharmacist Please refer to [facility] for unit-specific pharmacist

## 2021-01-29 NOTE — ED Notes (Signed)
Clinical staff in the room thye are aware of ots HR

## 2021-01-29 NOTE — Consult Note (Addendum)
[facility] Renal Consultation Note    Indication for Consultation:  Management of ESRD/hemodialysis; anemia, hypertension/volume and secondary hyperparathyroidism  HPI: Jeremy Mccoy is a 55 y.o. male with ESRD on HD, HTN, CHF, pAFib, hx polysubstance abuse, medical noncompliance.  He is admitted under observation status for management of AFib with RVR. He went to dialysis today and found to have HR to the 150s at his center and was sent to the ED. On Eliquis and Coreg at home. Cardizem infusion started in the ED and cardiology has been consulted. Labs notable for K+ 5.6. CXR shows chronic ILD.   Seen and examined in the ED. States he can feel his heart racing. He was SOB when he arrived but feels like "it's gotten better". Afib with HR 120s on the monitor. O2 sats 96% on RA. Says he has been doing a lot better with compliance on HD.   Dialysis [facility] MWF. Last dialysis 9/14. His treatment was incomplete d/t irregular heart rate. He was advised to got the ED at that time, but declined. He usually completes 3-3.5 hours of a 4 hour treatment. He has been leaving at his dry weight.   Past Medical History:  Diagnosis Date   Anemia    Anxiety    Arthritis    "qwhere" (05/15/2013)   CHF (congestive heart failure) (HCC)    COPD (chronic obstructive pulmonary disease) (HCC)    Crack cocaine use    Dental caries    Depression    ESRD (end stage renal disease) on dialysis ([place])    TTS: [address] [place] (05/15/2013)   Headache(784.0)    "get it from going to dialysis; when they get real bad I come to hospital" (05/15/2013)   Hematemesis/vomiting blood    History of blood transfusion 10/2011; 04/2013   HTN (hypertension) 06/12/2012   Seizures ([place])    Shortness of breath    "just when I have too much potassium is too high" (05/15/2013)   Past Surgical History:  Procedure Laterality Date   AV FISTULA PLACEMENT  11/29/2011   Procedure:  ARTERIOVENOUS (AV) FISTULA CREATION;  Surgeon: Angelia Mould, MD;  Location: [place];  Service: Vascular;  Laterality: Left;   CARDIOVERSION N/A 03/31/2020   Procedure: CARDIOVERSION;  Surgeon: Lelon Perla, MD;  Location: [facility] ENDOSCOPY;  Service: Cardiovascular;  Laterality: N/A;   ESOPHAGOGASTRODUODENOSCOPY N/A 05/17/2013   Procedure: ESOPHAGOGASTRODUODENOSCOPY (EGD);  Surgeon: Beryle Beams, MD;  Location: [facility] ENDOSCOPY;  Service: Endoscopy;  Laterality: N/A;   INGUINAL HERNIA REPAIR Bilateral 1967   IR FLUORO GUIDE CV LINE RIGHT  10/20/2017   IR THROMBECTOMY AV FISTULA W/THROMBOLYSIS/PTA INC/SHUNT/IMG LEFT Left 10/24/2017   IR US GUIDE VASC ACCESS LEFT  10/24/2017   IR US GUIDE VASC ACCESS RIGHT  10/20/2017   RENAL BIOPSY  11/07/2011   TEE WITHOUT CARDIOVERSION N/A 03/31/2020   Procedure: TRANSESOPHAGEAL ECHOCARDIOGRAM (TEE);  Surgeon: Lelon Perla, MD;  Location: [facility] ENDOSCOPY;  Service: Cardiovascular;  Laterality: N/A;   Family History  Problem Relation Age of Onset   Heart attack Father    Social History:  reports that he has been smoking cigars and cigarettes. He has a 50.00 pack-year smoking history. He has never used smokeless tobacco. He reports current drug use. Drugs: Marijuana and Cocaine. He reports that he does not drink alcohol. No Known Allergies Prior to Admission medications   Medication Sig Start Date End Date Taking? Authorizing Provider  acetaminophen (TYLENOL) 500 MG tablet  Take 1 tablet (500 mg total) by mouth every 8 (eight) hours as needed for mild pain or fever. 6 times a day, 500 mg, 12 to 24 tabs a day, 6,000 to 12,000 mg daily for pain 08/05/16  Yes Molt, Bethany, DO  albuterol (VENTOLIN HFA) 108 (90 Base) MCG/ACT inhaler Inhale 3-6 puffs into the lungs every 6 (six) hours as needed for wheezing or shortness of breath.    Yes [provider]  apixaban (ELIQUIS) 5 MG TABS tablet Take 1 tablet (5 mg total) by mouth 2 (two) times daily. 01/28/21  02/27/21 Yes Cleaver, Jossie Ng, NP  carvedilol (COREG) 3.125 MG tablet Take 2 tablets (6.25 mg total) by mouth 2 (two) times daily with a meal. 12/20/20  Yes Sharion Settler, DO  diphenhydrAMINE (BENADRYL) 25 mg capsule Take 25 mg by mouth every 6 (six) hours as needed for itching.   Yes [provider]  hydrALAZINE (APRESOLINE) 25 MG tablet Take 1 tablet (25 mg total) by mouth 3 (three) times daily. Patient taking differently: Take 25 mg by mouth daily. 05/26/20  Yes Weaver, Scott T, PA-C  hydrocortisone cream 1 % Apply 1 application topically 2 (two) times daily as needed for itching.   Yes [provider]  hydrOXYzine (ATARAX/VISTARIL) 25 MG tablet Take 50 mg by mouth 3 (three) times daily as needed for itching.   Yes [provider]  pantoprazole (PROTONIX) 40 MG tablet TAKE 1 TABLET (40 MG TOTAL) BY MOUTH DAILY. Patient taking differently: Take 40 mg by mouth daily. 04/01/20 04/01/21 Yes Ezequiel Essex, MD  sevelamer carbonate (RENVELA) 800 MG tablet Take 2 tablets (1,600 mg total) by mouth 3 (three) times daily with meals. Patient taking differently: Take 800-24,000 mg by mouth See admin instructions. 24000 mg three times daily with meals, and then '800mg'$  twice daily with snacks 01/25/14  Yes McLean-Scocuzza, Nino Glow, MD  sodium zirconium cyclosilicate (LOKELMA) 10 g PACK packet Take 10 g by mouth daily as needed (high potassium).   Yes [provider]  SPIRIVA RESPIMAT 2.5 MCG/ACT AERS Take 2 puffs by mouth daily. 01/28/21  Yes [provider]  tolnaftate (TINACTIN) 1 % spray Apply 1 application topically 2 (two) times daily as needed (itching on arms/back).   Yes [provider]  doxercalciferol (HECTOROL) 0.5 MCG capsule Doxercalciferol (Hectorol) 08/05/20 08/04/21  [provider]  Methoxy PEG-Epoetin Beta (MIRCERA IJ) Mircera 06/15/20 06/14/21  [provider]   Current Facility-Administered Medications  Medication Dose  Route Frequency Provider Last Rate Last Admin   acetaminophen (TYLENOL) tablet 650 mg  650 mg Oral Q6H PRN Norval Morton, MD       Or   acetaminophen (TYLENOL) suppository 650 mg  650 mg Rectal Q6H PRN Fuller Plan A, MD       albuterol (PROVENTIL) (2.5 MG/3ML) 0.083% nebulizer solution 2.5 mg  2.5 mg Nebulization Q6H PRN Norval Morton, MD       apixaban (ELIQUIS) tablet 5 mg  5 mg Oral BID Lavenia Atlas, RPH   5 mg at 01/29/21 1609   diltiazem (CARDIZEM) 125 mg in dextrose 5% 125 mL (1 mg/mL) infusion  5-15 mg/hr Intravenous Continuous Khatri, Hina, PA-C 10 mL/hr at 01/29/21 1602 10 mg/hr at 01/29/21 1602   hydrOXYzine (ATARAX/VISTARIL) tablet 50 mg  50 mg Oral TID PRN Fuller Plan A, MD       ondansetron (ZOFRAN) tablet 4 mg  4 mg Oral Q6H PRN Norval Morton, MD  Or   ondansetron (ZOFRAN) injection 4 mg  4 mg Intravenous Q6H PRN Tamala Julian, Rondell A, MD       sodium chloride flush (NS) 0.9 % injection 3 mL  3 mL Intravenous Q12H Norval Morton, MD       [START ON 01/30/2021] Tiotropium Bromide Monohydrate AERS 2 puff  2 puff Oral Daily Norval Morton, MD       Current Outpatient Medications  Medication Sig Dispense Refill   acetaminophen (TYLENOL) 500 MG tablet Take 1 tablet (500 mg total) by mouth every 8 (eight) hours as needed for mild pain or fever. 6 times a day, 500 mg, 12 to 24 tabs a day, 6,000 to 12,000 mg daily for pain 30 tablet 0   albuterol (VENTOLIN HFA) 108 (90 Base) MCG/ACT inhaler Inhale 3-6 puffs into the lungs every 6 (six) hours as needed for wheezing or shortness of breath.      apixaban (ELIQUIS) 5 MG TABS tablet Take 1 tablet (5 mg total) by mouth 2 (two) times daily. 60 tablet 3   carvedilol (COREG) 3.125 MG tablet Take 2 tablets (6.25 mg total) by mouth 2 (two) times daily with a meal. 180 tablet 1   diphenhydrAMINE (BENADRYL) 25 mg capsule Take 25 mg by mouth every 6 (six) hours as needed for itching.     hydrALAZINE (APRESOLINE) 25 MG tablet  Take 1 tablet (25 mg total) by mouth 3 (three) times daily. (Patient taking differently: Take 25 mg by mouth daily.) 270 tablet 3   hydrocortisone cream 1 % Apply 1 application topically 2 (two) times daily as needed for itching.     hydrOXYzine (ATARAX/VISTARIL) 25 MG tablet Take 50 mg by mouth 3 (three) times daily as needed for itching.     pantoprazole (PROTONIX) 40 MG tablet TAKE 1 TABLET (40 MG TOTAL) BY MOUTH DAILY. (Patient taking differently: Take 40 mg by mouth daily.) 30 tablet 0   sevelamer carbonate (RENVELA) 800 MG tablet Take 2 tablets (1,600 mg total) by mouth 3 (three) times daily with meals. (Patient taking differently: Take 800-24,000 mg by mouth See admin instructions. 24000 mg three times daily with meals, and then '800mg'$  twice daily with snacks) 90 tablet 0   sodium zirconium cyclosilicate (LOKELMA) 10 g PACK packet Take 10 g by mouth daily as needed (high potassium).     SPIRIVA RESPIMAT 2.5 MCG/ACT AERS Take 2 puffs by mouth daily.     tolnaftate (TINACTIN) 1 % spray Apply 1 application topically 2 (two) times daily as needed (itching on arms/back).     doxercalciferol (HECTOROL) 0.5 MCG capsule Doxercalciferol (Hectorol)     Methoxy PEG-Epoetin Beta (MIRCERA IJ) Mircera       ROS: As per HPI otherwise negative.  Physical Exam: Vitals:   01/29/21 1430 01/29/21 1443 01/29/21 1445 01/29/21 1455  BP: (!) 130/99  (!) 137/93   Pulse: 81 (!) 136 (!) 137 (!) 117  Resp: 19 18 (!) 27 (!) 21  Temp:      TempSrc:      SpO2: 94% 93%  94%     General: Chronically ill appearing, nad  Head: NCAT sclera not icteric MMM Neck: Supple. No JVD appreciated  Lungs: Diminished lung sounds RLL Heart: Tachycardic, irregular  Abdomen: soft non-tender  Lower extremities:without edema or ischemic changes, no open wounds  Neuro: A & O  X 3. Moves all extremities spontaneously. Psych:  Responds to questions appropriately with a normal affect. Dialysis Access: LUE AVF +bruit  Labs: Basic Metabolic Panel: Recent Labs  Lab 01/29/21 1156  NA 135  K 5.6*  CL 92*  CO2 25  GLUCOSE 81  BUN 75*  CREATININE 13.57*  CALCIUM 10.7*   Liver Function Tests: Recent Labs  Lab 01/29/21 1156  AST 10*  ALT 10  ALKPHOS 66  BILITOT 1.0  PROT 7.1  ALBUMIN 3.8   No results for input(s): LIPASE, AMYLASE in the last 168 hours. No results for input(s): AMMONIA in the last 168 hours. CBC: Recent Labs  Lab 01/29/21 1156  WBC 5.4  NEUTROABS 3.7  HGB 9.4*  HCT 30.4*  MCV 103.1*  PLT 141*   Cardiac Enzymes: No results for input(s): CKTOTAL, CKMB, CKMBINDEX, TROPONINI in the last 168 hours. CBG: No results for input(s): GLUCAP in the last 168 hours. Iron Studies: No results for input(s): IRON, TIBC, TRANSFERRIN, FERRITIN in the last 72 hours. Studies/Results: DG Chest 2 View  Result Date: 01/29/2021 CLINICAL DATA:  Chest pain and shortness of breath. Tachycardia during hemodialysis 2 days ago. History of atrial fibrillation. EXAM: CHEST - 2 VIEW COMPARISON:  Radiographs 12/18/2020 and 03/29/2020. CT 07/25/2015 and 01/20/2020. FINDINGS: Stable cardiomegaly, bilateral hilar prominence and diffuse interstitial prominence with central airway and fissural thickening. There are chronic calcified mediastinal and hilar lymph nodes and calcified granulomas bilaterally. No definite superimposed edema, confluent airspace opacity, pleural effusion or pneumothorax. The bones appear unchanged. IMPRESSION: Grossly stable chronic interstitial lung disease with sequela of prior granulomatous infection. If the etiology for this has not been diagnosed, consider follow-up high-resolution chest CT. No acute superimposed process identified. Electronically Signed   By: Richardean Sale M.D.   On: 01/29/2021 12:47    Dialysis Orders:  AF MWF 4h 450/1.5x EDW 71kg 1K/2.5Ca  Hectorol 7 TIW Parsbiv '15mg'$  TIW Venofer '100mg'$  IV x 10 (4/10 doses received)  Mircera 225 mcg IV q 2 wks (last 9/5)    Assessment/Plan: AFib w RVR - On Eliquis, Coreg. Cardizem infusion started in the ED. ?possible cardioversion  today per cardiology notes.  ESRD -  HD MWF. Last HD 9/14-incomplete treatment. K 5.6. Loklema 10g ordered.  Plan HD either tonight or 1st shift am depending on staffing.  Hypertension/volume  -  BP not to goal. Due for HD. No gross volume on exam. UF to EDW as tolerated   Anemia  - Hgb stable. On ESA/Fe load as outpatient. Continue if remains in hospital   Metabolic bone disease -  Continue home binders/Vit D   Lynnda Child PA-C [facility] 01/29/2021, 4:33 PM      Seen and examined independently.  Agree with note and exam as documented above by physician extender and as noted here.  Patient with ESRD on HD MWF at [facility] presented to ER with shortness of breath and found to have a fib with RVR.  He was placed on dilt gtt and later cardioverted. Note that he was sent from HD with tachycardia on 9/16 and previously not finished tx on 9/14 2/2 tachycardia.  HD advised going to ER then and he didn't.   General adult male in bed in no acute distress HEENT normocephalic atraumatic extraocular movements intact sclera anicteric Neck supple trachea midline Lungs clear to auscultation bilaterally normal work of breathing at rest  Heart S1S2 no rub  Abdomen soft nontender nondistended Extremities no edema; no cyanosis or clubbing Psych normal mood and affect Neuro alert and oriented x 3provides hx and follows commands Access LUE AVF bruit and thrill   #  ESRD on HD  - HD tonight to ensure that he tolerates  # A fib with RVR  - s/p cardioversion  - dilt gtt is on - Er is stopping as they say he no longer needs - HD unit had recommended going to ER Wed and he declined  # HTN  - optimize volume with HD  # Anemia CKD  - on ESA outpatient last dose 9/5.   # Metabolic bone disease  - continue home binders and act vit d  Claudia Desanctis,  MD 01/29/2021  6:46 PM

## 2021-01-29 NOTE — ED Triage Notes (Signed)
Pt reports tachycardia during dialysis 2 days ago. Hx of a fib with cardioversion earlier this year. Pt currently reports SOB and can feel his heart racing. Pt unable to do any of his dialysis today.

## 2021-01-29 NOTE — ED Provider Notes (Addendum)
[facility] EMERGENCY DEPARTMENT Provider Note   CSN: [id number] Arrival date & time: 01/29/21  1136     History Chief Complaint  Patient presents with   Tachycardia   Shortness of Breath    SIAH MEWS is a 55 y.o. male with a past medical history of CHF, COPD, drug use, ESRD on dialysis, hypertension, A. Fib presenting to the ED with a chief complaint of tachycardia, palpitations, shortness of breath and chest pain.  Symptoms began on 01/27/2021.  He gets dialysis on Monday, Wednesday and Friday.  2 days ago when he was at dialysis started experiencing the symptoms.  They had to stop about halfway.  He was told to come to the ER but he did not do so unfortunately.  He tried to get in touch with his cardiologist but is not scheduled for an appointment until November.  He again presented to dialysis today with persistent symptoms and tachycardia and was sent to the ER.  States that this is happened to him in the past with his A. fib.  He has been compliant with all of his medications including his Eliquis and carvedilol.  He reports a pressure sensation in the middle of his chest that has been constant.  Denies any leg swelling.  No fever, cough, vomiting, abdominal pain.   Shortness of Breath Associated symptoms: chest pain   Associated symptoms: no abdominal pain, no cough, no ear pain, no fever, no rash, no sore throat, no vomiting and no wheezing       Past Medical History:  Diagnosis Date   Anemia    Anxiety    Arthritis    "qwhere" (05/15/2013)   CHF (congestive heart failure) (HCC)    COPD (chronic obstructive pulmonary disease) ([place])    Crack cocaine use    Dental caries    Depression    ESRD (end stage renal disease) on dialysis ([place])    TTS: [address] [place] (05/15/2013)   Headache(784.0)    "get it from going to dialysis; when they get real bad I come to hospital" (05/15/2013)   Hematemesis/vomiting blood    History of blood transfusion  10/2011; 04/2013   HTN (hypertension) 06/12/2012   Seizures ([place])    Shortness of breath    "just when I have too much potassium is too high" (05/15/2013)    Patient Active Problem List   Diagnosis Date Noted   Dysphagia    Palpitations    HFrEF (heart failure with reduced ejection fraction) (HCC)    Nonspecific chest pain    Atrial fibrillation with RVR ([place]) 03/25/2020   Acute respiratory failure due to COVID-19 ([employer]) 02/24/2020   Gastrointestinal hemorrhage, unspecified 02/24/2020   Allergy, unspecified, initial encounter 01/31/2020   Anaphylactic shock, unspecified, initial encounter 01/31/2020   Pleural effusion, not elsewhere classified 01/23/2020   Hyperkalemia, diminished renal excretion 01/20/2020   Plantar fasciitis, bilateral 09/12/2017   Headache, unspecified 05/24/2017   Fever, unspecified 10/05/2016   Hyperkalemia 02/23/2016   Vasculitis ([place])    Hemoptysis 07/25/2015   Encounter for removal of sutures 07/09/2015   Dependence on renal dialysis ([place]) 08/01/2014   Encounter for fitting and adjustment of extracorporeal dialysis catheter ([place]) 08/01/2014   Pruritus, unspecified 06/27/2014   PAF (paroxysmal atrial fibrillation) ([place]) 01/31/2014   Chronic diastolic congestive heart failure ([place]) 01/31/2014   COPD (chronic obstructive pulmonary disease) ([place]) 01/31/2014   Thrombocytopenia ([place]) 01/23/2014   Unspecified constipation 01/23/2014   High anion gap metabolic acidosis  01/21/2014   ESRD on hemodialysis ([place]) 11/05/2013   Seizure disorder ([place]) 11/04/2013   Protein-calorie malnutrition, severe ([place]) 07/12/2013   Respiratory failure ([place]) 07/10/2013   Status epilepticus ([place]) 07/10/2013   Essential hypertension 07/10/2013   Hematemesis 05/15/2013   Pruritus ani 04/27/2013   Other specified symptoms and signs involving the circulatory and respiratory systems 04/22/2013   Abnormal EKG 09/20/2012   Drug abuse ([place]) 09/20/2012   Elevated troponin 09/20/2012    Anemia in chronic kidney disease 09/20/2012   Cocaine abuse ([place]) 06/12/2012   ESRD (end stage renal disease) ([place]) 01/11/2012   Iron deficiency anemia, unspecified 12/02/2011   Secondary hyperparathyroidism of renal origin ([place]) 12/02/2011   Tobacco abuse 11/04/2011   Dental caries 11/04/2011   Color blindness 11/04/2011    Past Surgical History:  Procedure Laterality Date   AV FISTULA PLACEMENT  11/29/2011   Procedure: ARTERIOVENOUS (AV) FISTULA CREATION;  Surgeon: Angelia Mould, MD;  Location: [facility];  Service: Vascular;  Laterality: Left;   CARDIOVERSION N/A 03/31/2020   Procedure: CARDIOVERSION;  Surgeon: Lelon Perla, MD;  Location[facility] ENDOSCOPY;  Service: Cardiovascular;  Laterality: N/A;   ESOPHAGOGASTRODUODENOSCOPY N/A 05/17/2013   Procedure: ESOPHAGOGASTRODUODENOSCOPY (EGD);  Surgeon: Beryle Beams, MD;  Location[facility] END[facility]COPY;  Service: Endoscopy;  Laterality: N/A;   INGUINAL HERNIA REPAIR Bilateral 1967   IR FLUORO GUIDE CV LINE RIGHT  10/20/2017   IR THROMBECTOMY AV FISTULA W/THROMBOLYSIS/PTA INC/SHUNT/IMG LEFT Left 10/24/2017   IR US GUIDE VASC ACCESS LEFT  10/24/2017   IR US GUIDE VASC ACCESS RIGHT  10/20/2017   RENAL BIOPSY  11/07/2011   TEE WITHOUT CARDIOVERSION N/A 03/31/2020   Procedure: TRANSESOPHAGEAL ECHOCARDIOGRAM (TEE);  Surgeon: Lelon Perla, MD;  Location: [facility] ENDOSCOPY;  Service: Cardiovascular;  Laterality: N/A;       Family History  Problem Relation Age of Onset   Heart attack Father     Social History   Tobacco Use   Smoking status: Every Day    Packs/day: 1.00    Years: 50.00    Pack years: 50.00    Types: Cigars, Cigarettes   Smokeless tobacco: Never  Vaping Use   Vaping Use: Never used  Substance Use Topics   Alcohol use: No    Comment: 05/15/2013 "aien't drank in ~ 25 yrs"   Drug use: Yes    Types: Marijuana, Cocaine    Comment: Precription drugs (e.g. percocet) "I take what I have to to controll my constant pain"  (05/15/2013)    Home Medications Prior to Admission medications   Medication Sig Start Date End Date Taking? Authorizing Provider  acetaminophen (TYLENOL) 500 MG tablet Take 1 tablet (500 mg total) by mouth every 8 (eight) hours as needed for mild pain or fever. 6 times a day, 500 mg, 12 to 24 tabs a day, 6,000 to 12,000 mg daily for pain 08/05/16  Yes Molt, Bethany, DO  albuterol (VENTOLIN HFA) 108 (90 Base) MCG/ACT inhaler Inhale 3-6 puffs into the lungs every 6 (six) hours as needed for wheezing or shortness of breath.    Yes [provider]  apixaban (ELIQUIS) 5 MG TABS tablet Take 1 tablet (5 mg total) by mouth 2 (two) times daily. 01/28/21 02/27/21 Yes Cleaver, Jossie Ng, NP  carvedilol (COREG) 3.125 MG tablet Take 2 tablets (6.25 mg total) by mouth 2 (two) times daily with a meal. 12/20/20  Yes Sharion Settler, DO  diphenhydrAMINE (BENADRYL) 25 mg capsule Take 25 mg by mouth every 6 (six)  hours as needed for itching.   Yes [provider]  hydrALAZINE (APRESOLINE) 25 MG tablet Take 1 tablet (25 mg total) by mouth 3 (three) times daily. Patient taking differently: Take 25 mg by mouth daily. 05/26/20  Yes Weaver, Scott T, PA-C  hydrocortisone cream 1 % Apply 1 application topically 2 (two) times daily as needed for itching.   Yes [provider]  hydrOXYzine (ATARAX/VISTARIL) 25 MG tablet Take 50 mg by mouth 3 (three) times daily as needed for itching.   Yes [provider]  pantoprazole (PROTONIX) 40 MG tablet TAKE 1 TABLET (40 MG TOTAL) BY MOUTH DAILY. Patient taking differently: Take 40 mg by mouth daily. 04/01/20 04/01/21 Yes Ezequiel Essex, MD  sevelamer carbonate (RENVELA) 800 MG tablet Take 2 tablets (1,600 mg total) by mouth 3 (three) times daily with meals. Patient taking differently: Take 800-24,000 mg by mouth See admin instructions. 24000 mg three times daily with meals, and then '800mg'$  twice daily with snacks 01/25/14  Yes McLean-Scocuzza, Nino Glow,  MD  sodium zirconium cyclosilicate (LOKELMA) 10 g PACK packet Take 10 g by mouth daily as needed (high potassium).   Yes [provider]  SPIRIVA RESPIMAT 2.5 MCG/ACT AERS Take 2 puffs by mouth daily. 01/28/21  Yes [provider]  tolnaftate (TINACTIN) 1 % spray Apply 1 application topically 2 (two) times daily as needed (itching on arms/back).   Yes [provider]  doxercalciferol (HECTOROL) 0.5 MCG capsule Doxercalciferol (Hectorol) 08/05/20 08/04/21  [provider]  Methoxy PEG-Epoetin Beta (MIRCERA IJ) Mircera 06/15/20 06/14/21  [provider]    Allergies    Patient has no known allergies.  Review of Systems   Review of Systems  Constitutional:  Negative for appetite change, chills and fever.  HENT:  Negative for ear pain, rhinorrhea, sneezing and sore throat.   Eyes:  Negative for photophobia and visual disturbance.  Respiratory:  Positive for chest tightness and shortness of breath. Negative for cough and wheezing.   Cardiovascular:  Positive for chest pain and palpitations.  Gastrointestinal:  Negative for abdominal pain, blood in stool, constipation, diarrhea, nausea and vomiting.  Genitourinary:  Negative for dysuria, hematuria and urgency.  Musculoskeletal:  Negative for myalgias.  Skin:  Negative for rash.  Neurological:  Negative for dizziness, weakness and light-headedness.   Physical Exam Updated Vital Signs BP (!) 137/93   Pulse (!) 117   Temp 98.1 F (36.7 C) (Oral)   Resp (!) 21   SpO2 94%   Physical Exam Vitals and nursing note reviewed.  Constitutional:      General: He is not in acute distress.    Appearance: He is well-developed.  HENT:     Head: Normocephalic and atraumatic.     Nose: Nose normal.  Eyes:     General: No scleral icterus.       Left eye: No discharge.     Conjunctiva/sclera: Conjunctivae normal.  Cardiovascular:     Rate and Rhythm: Tachycardia present. Rhythm irregular.     Heart sounds:  Normal heart sounds. No murmur heard.   No friction rub. No gallop.  Pulmonary:     Effort: Pulmonary effort is normal. No respiratory distress.     Breath sounds: Normal breath sounds.  Abdominal:     General: Bowel sounds are normal. There is no distension.     Palpations: Abdomen is soft.     Tenderness: There is no abdominal tenderness. There is no guarding.  Musculoskeletal:  General: Normal range of motion.     Cervical back: Normal range of motion and neck supple.  Skin:    General: Skin is warm and dry.     Findings: No rash.  Neurological:     Mental Status: He is alert.     Motor: No abnormal muscle tone.     Coordination: Coordination normal.    ED Results / Procedures / Treatments   Labs (all labs ordered are listed, but only abnormal results are displayed) Labs Reviewed  COMPREHENSIVE METABOLIC PANEL - Abnormal; Notable for the following components:      Result Value   Potassium 5.6 (*)    Chloride 92 (*)    BUN 75 (*)    Creatinine, Ser 13.57 (*)    Calcium 10.7 (*)    AST 10 (*)    GFR, Estimated 4 (*)    Anion gap 18 (*)    All other components within normal limits  CBC WITH DIFFERENTIAL/PLATELET - Abnormal; Notable for the following components:   RBC 2.95 (*)    Hemoglobin 9.4 (*)    HCT 30.4 (*)    MCV 103.1 (*)    RDW 16.8 (*)    Platelets 141 (*)    All other components within normal limits  TROPONIN I (HIGH SENSITIVITY) - Abnormal; Notable for the following components:   Troponin I (High Sensitivity) 74 (*)    All other components within normal limits  RESP PANEL BY RT-PCR (FLU A&B, COVID) ARPGX2  RAPID URINE DRUG SCREEN, HOSP PERFORMED  TROPONIN I (HIGH SENSITIVITY)    EKG EKG Interpretation  Date/Time:  Friday January 29 2021 11:37:04 EDT Ventricular Rate:  123 PR Interval:    QRS Duration: 88 QT Interval:  310 QTC Calculation: 443 R Axis:   -78 Text Interpretation: Atrial fibrillation with rapid ventricular response Left  axis deviation Minimal voltage criteria for LVH, may be normal variant ( Cornell product ) Inferior infarct , age undetermined Abnormal ECG Confirmed by Dene Gentry [phone]) on 01/29/2021 1:33:10 PM  Radiology DG Chest 2 View  Result Date: 01/29/2021 CLINICAL DATA:  Chest pain and shortness of breath. Tachycardia during hemodialysis 2 days ago. History of atrial fibrillation. EXAM: CHEST - 2 VIEW COMPARISON:  Radiographs 12/18/2020 and 03/29/2020. CT 07/25/2015 and 01/20/2020. FINDINGS: Stable cardiomegaly, bilateral hilar prominence and diffuse interstitial prominence with central airway and fissural thickening. There are chronic calcified mediastinal and hilar lymph nodes and calcified granulomas bilaterally. No definite superimposed edema, confluent airspace opacity, pleural effusion or pneumothorax. The bones appear unchanged. IMPRESSION: Grossly stable chronic interstitial lung disease with sequela of prior granulomatous infection. If the etiology for this has not been diagnosed, consider follow-up high-resolution chest CT. No acute superimposed process identified. Electronically Signed   By: Richardean Sale M.D.   On: 01/29/2021 12:47    Procedures .Critical Care Performed by: Delia Heady, PA-C Authorized by: Delia Heady, PA-C   Critical care provider statement:    Critical care time (minutes):  45   Critical care time was exclusive of:  Separately billable procedures and treating other patients and teaching time   Critical care was necessary to treat or prevent imminent or life-threatening deterioration of the following conditions:  Cardiac failure, circulatory failure, respiratory failure, metabolic crisis, CNS failure or compromise and dehydration   Critical care was time spent personally by me on the following activities:  Development of treatment plan with patient or surrogate, discussions with consultants, evaluation of patient's response to treatment, examination  of patient, obtaining  history from patient or surrogate, ordering and review of laboratory studies, ordering and review of radiographic studies, ordering and performing treatments and interventions, pulse oximetry, re-evaluation of patient's condition and review of old charts   I assumed direction of critical care for this patient from another provider in my specialty: no     Care discussed with: admitting provider     Medications Ordered in ED Medications  diltiazem (CARDIZEM) 125 mg in dextrose 5% 125 mL (1 mg/mL) infusion (5 mg/hr Intravenous New Bag/Given 01/29/21 1457)  diltiazem (CARDIZEM) injection 10 mg (10 mg Intravenous Given 01/29/21 1423)  sodium zirconium cyclosilicate (LOKELMA) packet 10 g (10 g Oral Given 01/29/21 1436)    ED Course  I have reviewed the triage vital signs and the nursing notes.  Pertinent labs & imaging results that were available during my care of the patient were reviewed by me and considered in my medical decision making (see chart for details).  Clinical Course as of 01/29/21 1738  Fri Jan 29, 2021  1331 Potassium(!): 5.6 [HK]  1331 Creatinine(!): 13.57 [HK]  1332 Troponin I (High Sensitivity)(!): 74 [HK]  1403 Lokelma 10g once- Dr. Royce Macadamia. Will see in consult today. HD today or tomorrow but [facility] for now. [HK]  1436 Cards to see in consult. [HK]    Clinical Course User Index [HK] Delia Heady, PA-C   MDM Rules/Calculators/A&P                           55 year old male with past medical history of A. fib, ESRD on dialysis, substance abuse, hypertension presenting to the ED for palpitations, shortness of breath and chest pain.  Last went to dialysis 2 days ago but only completed half a session due to the symptoms.  Presented dialysis today but unfortunately could not do any of his session because of his tachycardia and symptoms.  She arrives here tachycardic to the 130s.  EKG shows A. fib with RVR.  Chest x-ray shows interstitial lung disease without evidence of  superimposed process.  His work-up is significant for hyperkalemia 5.6, creatinine of 13 and anion gap of 18 all consistent with recently missed a dialysis session.  His troponin is slightly elevated but feel this is most likely related to his ESRD as well as being in A. fib.  He reports compliance with his medications.  Will give a dose of diltiazem to evaluate for improvement.  He will require admission in the hospital with consult by both nephrology for dialysis and cardiology for his A. fib.  His tachycardia from A. fib did not improve with Cardizem bolus.  We will begin him on a Cardizem drip and he is still awaiting cardiology evaluation at the bedside.  I have admitted to hospitalist service.   5:38 PM Patient evaluated by cardiologist, Dr. Gwenlyn Found at the bedside.  He recommended cardioversion.  This was done in the ER successfully with conversion to sinus rhythm.  He states that if he converts to sinus rhythm he is cleared for discharge from a cardiology standpoint. Dr. Royce Macadamia, nephrologist will evaluate him at the bedside  6:05 PM Patient evaluated by Dr. Royce Macadamia, nephrologist at the bedside.  She does recommend admission for overnight observation and to get dialysis while in the hospital.   Portions of this note were generated with Dragon dictation software. Dictation errors may occur despite best attempts at proofreading.  Final Clinical Impression(s) / ED Diagnoses Final diagnoses:  Atrial fibrillation with RVR (HCC)  ESRD (end stage renal disease) ([place])  Hyperkalemia    Rx / DC Orders ED Discharge Orders     None          Delia Heady, PA-C 01/29/21 1805    Valarie Merino, MD 01/30/21 1800

## 2021-01-29 NOTE — Consult Note (Addendum)
Cardiology Consultation:   Patient ID: Jeremy Mccoy MRN: [id number]; DOB: Sep 29, 1965  Admit date: 01/29/2021 Date of Consult: 01/29/2021  PCP:  Patient, No Pcp Per (Inactive)   [facility] Providers Cardiologist:  Fransico Him, MD  Cardiology APP:  Liliane Shi, PA-C  {    Patient Profile:   Jeremy Mccoy is a 55 y.o. male with a hx of paroxysmal atrial fibrillation/flutter, tachycardia mediated cardiomyopathy with normalized LV function on last echo, hypertension, COPD with tobacco abuse, end-stage renal disease on hemodialysis  (MWF) and GERD who is being seen 01/29/2021 for the evaluation of atrial fibrillation with rapid ventricular rate at the request of Dr. Tamala Julian.  Admitted with a flutter RVR November 2021.  Converted to sinus rhythm on TEE cardioversion.  He was treated with IV amiodarone and discharged on p.o.    Admitted August 2022 with A. fib RVR in setting of noncompliance with anticoagulation. He was not taking amiodarone on admission.  He was placed on IV Cardizem and heparin.  Spontaneously converted to sinus rhythm. Elevated troponin felt demand ischemia.  Echocardiogram showed normal LV function at 55 to 60%, no wall motion abnormality and diastolic parameter were intermediate.  Heparin switch it to Eliquis.  Seen by Coletta Memos, NP for hospital follow-up.  He was maintaining sinus rhythm and compliant with his medication.  History of Present Illness:   [name] experienced tachycardia/palpitation while doing dialysis on Wednesday.  He has completed 2 hours of dialysis.  He was advised to go to ER but unfortunately he did not came.  He continues to have palpitation yesterday.  Intermittent shortness of breath.  No dizziness, chest pain or syncope.  This morning he went for dialysis and noted tachycardic with atrial fibrillation and brought to ER for further evaluation.  He is started on IV diltiazem for rate control.  Patient reports compliance with Eliquis  and carvedilol since discharge.  He did not took his Eliquis this morning per patient report. He reports itching and some altered taste since last admission.  Patient smoke 1 pack of cigarette every day.  Denies marijuana and cocaine abuse.  No alcohol drinking.   Past Medical History:  Diagnosis Date   Anemia    Anxiety    Arthritis    "qwhere" (05/15/2013)   CHF (congestive heart failure) (HCC)    COPD (chronic obstructive pulmonary disease) (HCC)    Crack cocaine use    Dental caries    Depression    ESRD (end stage renal disease) on dialysis ([place])    TTS: [address] [place] (05/15/2013)   Headache(784.0)    "get it from going to dialysis; when they get real bad I come to hospital" (05/15/2013)   Hematemesis/vomiting blood    History of blood transfusion 10/2011; 04/2013   HTN (hypertension) 06/12/2012   Seizures ([place])    Shortness of breath    "just when I have too much potassium is too high" (05/15/2013)    Past Surgical History:  Procedure Laterality Date   AV FISTULA PLACEMENT  11/29/2011   Procedure: ARTERIOVENOUS (AV) FISTULA CREATION;  Surgeon: Angelia Mould, MD;  Location: [facility];  Service: Vascular;  Laterality: Left;   CARDIOVERSION N/A 03/31/2020   Procedure: CARDIOVERSION;  Surgeon: Lelon Perla, MD;  Location: [facility] ENDOSCOPY;  Service: Cardiovascular;  Laterality: N/A;   ESOPHAGOGASTRODUODENOSCOPY N/A 05/17/2013   Procedure: ESOPHAGOGASTRODUODENOSCOPY (EGD);  Surgeon: Beryle Beams, MD;  Location: [facility] ENDOSCOPY;  Service: Endoscopy;  Laterality: N/A;  INGUINAL HERNIA REPAIR Bilateral 1967   IR FLUORO GUIDE CV LINE RIGHT  10/20/2017   IR THROMBECTOMY AV FISTULA W/THROMBOLYSIS/PTA INC/SHUNT/IMG LEFT Left 10/24/2017   IR US GUIDE VASC ACCESS LEFT  10/24/2017   IR US GUIDE VASC ACCESS RIGHT  10/20/2017   RENAL BIOPSY  11/07/2011   TEE WITHOUT CARDIOVERSION N/A 03/31/2020   Procedure: TRANSESOPHAGEAL ECHOCARDIOGRAM (TEE);  Surgeon: Lelon Perla, MD;   Location: [facility] ENDOSCOPY;  Service: Cardiovascular;  Laterality: N/A;   Inpatient Medications: Scheduled Meds:  Continuous Infusions:  diltiazem (CARDIZEM) infusion 5 mg/hr (01/29/21 1457)   PRN Meds:   Allergies:   No Known Allergies  Social History:   Social History   Socioeconomic History   Marital status: Single    Spouse name: Not on file   Number of children: 0   Years of education: 9 th   Highest education level: Not on file  Occupational History   Occupation: Corporate treasurer    Comment: Disabled  Tobacco Use   Smoking status: Every Day    Packs/day: 1.00    Years: 50.00    Pack years: 50.00    Types: Cigars, Cigarettes   Smokeless tobacco: Never  Vaping Use   Vaping Use: Never used  Substance and Sexual Activity   Alcohol use: No    Comment: 05/15/2013 "aien't drank in ~ 25 yrs"   Drug use: Yes    Types: Marijuana, Cocaine    Comment: Precription drugs (e.g. percocet) "I take what I have to to controll my constant pain" (05/15/2013)   Sexual activity: Never  Other Topics Concern   Not on file  Social History Narrative   ** Merged History Encounter **   Patient lives at home with his parents and he is disabled.   Education 9th grade education   Right handed   Caffeine one cup daily       Social Determinants of Health   Financial Resource Strain: Not on file  Food Insecurity: Not on file  Transportation Needs: Not on file  Physical Activity: Not on file  Stress: Not on file  Social Connections: Not on file  Intimate Partner Violence: Not on file    Family History:    Family History  Problem Relation Age of Onset   Heart attack Father      ROS:  Please see the history of present illness.  All other ROS reviewed and negative.     Physical Exam/Data:   Vitals:   01/29/21 1430 01/29/21 1443 01/29/21 1445 01/29/21 1455  BP: (!) 130/99  (!) 137/93   Pulse: 81 (!) 136 (!) 137 (!) 117  Resp: 19 18 (!) 27 (!) 21  Temp:      TempSrc:       SpO2: 94% 93%  94%   No intake or output data in the 24 hours ending 01/29/21 1600 Last 3 Weights 12/28/2020 12/20/2020 12/19/2020  Weight (lbs) 159 lb 6.4 oz 166 lb 14.2 oz 163 lb 5.8 oz  Weight (kg) 72.303 kg 75.7 kg 74.1 kg  Some encounter information is confidential and restricted. Go to Review Flowsheets activity to see all data.     There is no height or weight on file to calculate BMI.  General:  Well nourished, well developed, in no acute distress HEENT: normal Neck: no JVD Vascular: No carotid bruits; Distal pulses 2+ bilaterally Cardiac:  normal S1, S2; irregularly irregular tachycardia, + murmur  Lungs:  clear to auscultation bilaterally, no wheezing, rhonchi or  rales  Abd: soft, nontender, no hepatomegaly  Ext: no edema Musculoskeletal:  No deformities, BUE and BLE strength normal and equal Skin: warm and dry  Neuro:  CNs 2-12 intact, no focal abnormalities noted Psych:  Normal affect   EKG:  The EKG was personally reviewed and demonstrates: Atrial fibrillation at rate of 123 bpm Telemetry:  Telemetry was personally reviewed and demonstrates: Clear fibrillation at rate of 110-130s  Relevant CV Studies:  Echo 12/19/20 1. Left ventricular ejection fraction, by estimation, is 55 to 60%. The  left ventricle has normal function. The left ventricle has no regional  wall motion abnormalities. The left ventricular internal cavity size was  mildly to moderately dilated. There  is mild left ventricular hypertrophy. Left ventricular diastolic  parameters are indeterminate.   2. Right ventricular systolic function is mildly reduced. The right  ventricular size is normal.   3. Left atrial size was severely dilated.   4. Right atrial size was moderately dilated.   5. Peak and mean gradients through the MV are 17 and 6 mm Hg respectively  consistent with moderate mitral stenosis Compared to echo report from  September 2021, no significant change . Mild mitral valve regurgitation.   Severe mitral annular calcification.   6. Aortic valve regurgitation is trivial. Mild to moderate aortic valve  sclerosis/calcification is present, without any evidence of aortic  stenosis.   7. The inferior vena cava is dilated in size with <50% respiratory  variability, suggesting right atrial pressure of 15 mmHg.   Laboratory Data:  High Sensitivity Troponin:   Recent Labs  Lab 01/29/21 1156 01/29/21 1435  TROPONINIHS 74* 68*     Chemistry Recent Labs  Lab 01/29/21 1156  NA 135  K 5.6*  CL 92*  CO2 25  GLUCOSE 81  BUN 75*  CREATININE 13.57*  CALCIUM 10.7*  GFRNONAA 4*  ANIONGAP 18*    Recent Labs  Lab 01/29/21 1156  PROT 7.1  ALBUMIN 3.8  AST 10*  ALT 10  ALKPHOS 66  BILITOT 1.0    Hematology Recent Labs  Lab 01/29/21 1156  WBC 5.4  RBC 2.95*  HGB 9.4*  HCT 30.4*  MCV 103.1*  MCH 31.9  MCHC 30.9  RDW 16.8*  PLT 141*   Radiology/Studies:  DG Chest 2 View  Result Date: 01/29/2021 CLINICAL DATA:  Chest pain and shortness of breath. Tachycardia during hemodialysis 2 days ago. History of atrial fibrillation. EXAM: CHEST - 2 VIEW COMPARISON:  Radiographs 12/18/2020 and 03/29/2020. CT 07/25/2015 and 01/20/2020. FINDINGS: Stable cardiomegaly, bilateral hilar prominence and diffuse interstitial prominence with central airway and fissural thickening. There are chronic calcified mediastinal and hilar lymph nodes and calcified granulomas bilaterally. No definite superimposed edema, confluent airspace opacity, pleural effusion or pneumothorax. The bones appear unchanged. IMPRESSION: Grossly stable chronic interstitial lung disease with sequela of prior granulomatous infection. If the etiology for this has not been diagnosed, consider follow-up high-resolution chest CT. No acute superimposed process identified. Electronically Signed   By: Richardean Sale M.D.   On: 01/29/2021 12:47    Assessment and Plan:   Atrial fibrillation with rapid ventricular  rate -History of a flutter 03/2020 s/p TEE cardioversion with maintenance of sinus rhythm -History of A. fib RVR October 2022>> converted to sinus rhythm on heparin and IV Cardizem -Patient reports compliance with Eliquis since last admission.  He did not took his Eliquis this morning.  Order written for Eliquis per pharmacy to give ASAP. -Patient states that he never started  amiodarone after admission of 03/2020 -Likely patient is a candidate for cardioversion in ER -At length discussion regarding Eliquis compliance -Continue carvedilol -Patient will need antiarrhythmic versus ablation.  If patient goes home after cardioversion, consider A. fib clinic evaluation next week  2.  End-stage renal disease on hemodialysis -Dialysis arrangement per primary team -He has missed his dialysis this morning  3.  Hypertension -Blood pressure stable on current medication  4.  Chronic heart failure with preserved LV function -EF was 30-35% on November 2021.  This was felt tachycardia mediated cardiomyopathy.  Last echo December 19, 2020 showed LV function of 55 to 60% and intermediate diastolic parameter.  No regional wall motion abnormality  5.  Mitral  valve disease - Echo 12/2020>> Peak and mean gradients through the MV are 17 and 6 mm Hg respectively consistent with moderate mitral stenosis Compared to echo report from  September 2021, no significant change . Mild mitral valve regurgitation.  Severe mitral annular calcification.   6. COPD - No active wheezing  7. Tobacco smoking - Recommended cessation    Risk Assessment/Risk Scores:   New York Heart Association (NYHA) Functional Class NYHA Class I  CHA2DS2-VASc Score = 3  } This indicates a 3.2% annual risk of stroke. The patient's score is based upon: CHF History: 1 HTN History: 1 Diabetes History: 0 Stroke History: 0 Vascular Disease History: 1 Age Score: 0 Gender Score: 0   For questions or updates, please contact [place] Please consult [email or web address] for contact info under    Signed, Leanor Kail, PA  01/29/2021 4:00 PM   Agree with note by Robbie Lis PA-C  We are asked to see Mr. Romberger for recurrent A. fib.  He has a history of PAF.  He was cardioverted a month ago in early August and placed on Eliquis oral anticoagulation which he has been noncompliant with in the past.  He does have a history of chronic renal insufficiency on hemodialysis and cardiomyopathy with an EF that had recovered most recently to the 50 to 55% range.  Amiodarone was discussed but was never started.  He developed A. fib this past Wednesday while at hemodialysis and presents here today with A. fib with RVR.  He is on a diltiazem drip at 5 mg/h.  Blood pressure stable.  His heart rate is in the low 100 range.  His exam is benign.  His labs are remarkable for a mildly elevated potassium.  Given the fact that according to the patient there has been no interruption in his Eliquis dosing I feel comfortable recommending DC cardioversion in the emergency room.  If successful, he can be discharged home.  We will arrange early follow-up in the A. fib clinic next week.   Lorretta Harp, M.D., [place], [facility], [place] [address] [place], [place]  [address]  [phone] 01/29/2021 5:49 PM

## 2021-01-29 NOTE — H&P (Addendum)
History and Physical    Jeremy Mccoy [id number] DOB: 1965-07-16 DOA: 01/29/2021  Referring MD/NP/PA: Delia Heady, PA-C PCP: Patient, No Pcp Per (Inactive)  Patient coming from: From hemodialysis  Chief Complaint: Palpitations  I have personally briefly reviewed patient's old medical records in [place]   HPI: Jeremy Mccoy is a 55 y.o. male with medical history significant of atrial fibrillation, HTN, HFrEF, ESRD on HD, COPD, and history of polysubstance abuse who presents with complaints of palpitations and  shortness of breath.  He had gone to hemodialysis 2 days ago, but halfway through his session had developed a fast heart rate for which the treatment was stopped.  He was directed to go to the emergency department at that time, but he did not come in.  He continued to have palpitations with some shortness of breath, and chest discomfort.  When he went to hemodialysis this morning was told that they were unable to dialyze him.  He has been taking Eliquis and Coreg as prescribed with his last dose this morning.  Patient had just been hospitalized from 8/5-8/7 with A. fib with RVR.  ED Course: Upon admission into the emergency department patient was seen to be afebrile, heart rates elevated up to 137 in atrial fibrillation, respirations 18-27, blood pressures 130/99 137/93, and O2 saturations maintained on room air.  Labs significant for hemoglobin 9.4, platelets 141, potassium 5.6, BUN 75, creatinine 13.57, calcium 10.7, anion gap 18, and high-sensitivity troponin 74.  Chest x-ray noted gross chronic interstitial lung disease with sequela of prior granulomatous infection. COVID-19 screening was pending.  Patient was given 10 g of Lokelma, 10 mg of Cardizem, and started on Cardizem drip.  Cardiology have been formally consulted and are able to cardiovert the patient back into sinus rhythm.  At this time the patient denies any complaints of chest pain.  Nephrology recommended  admission for at least observation to make sure he tolerates dialysis.  Review of Systems  Constitutional:  Negative for fever.  Respiratory:  Positive for shortness of breath.   Cardiovascular:  Positive for chest pain and palpitations. Negative for leg swelling.  All other systems reviewed and are negative.  Past Medical History:  Diagnosis Date   Anemia    Anxiety    Arthritis    "qwhere" (05/15/2013)   CHF (congestive heart failure) (HCC)    COPD (chronic obstructive pulmonary disease) (HCC)    Crack cocaine use    Dental caries    Depression    ESRD (end stage renal disease) on dialysis ([place])    TTS: [address] [place] (05/15/2013)   Headache(784.0)    "get it from going to dialysis; when they get real bad I come to hospital" (05/15/2013)   Hematemesis/vomiting blood    History of blood transfusion 10/2011; 04/2013   HTN (hypertension) 06/12/2012   Seizures ([place])    Shortness of breath    "just when I have too much potassium is too high" (05/15/2013)    Past Surgical History:  Procedure Laterality Date   AV FISTULA PLACEMENT  11/29/2011   Procedure: ARTERIOVENOUS (AV) FISTULA CREATION;  Surgeon: Angelia Mould, MD;  Location: [place];  Service: Vascular;  Laterality: Left;   CARDIOVERSION N/A 03/31/2020   Procedure: CARDIOVERSION;  Surgeon: Lelon Perla, MD;  Location[facility] ENDOSCOPY;  Service: Cardiovascular;  Laterality: N/A;   ESOPHAGOGASTRODUODENOSCOPY N/A 05/17/2013   Procedure: ESOPHAGOGASTRODUODENOSCOPY (EGD);  Surgeon: Beryle Beams, MD;  Location: [facility] ENDOSCOPY;  Service: Endoscopy;  Laterality: N/A;   INGUINAL HERNIA REPAIR Bilateral 1967   IR FLUORO GUIDE CV LINE RIGHT  10/20/2017   IR THROMBECTOMY AV FISTULA W/THROMBOLYSIS/PTA INC/SHUNT/IMG LEFT Left 10/24/2017   IR US GUIDE VASC ACCESS LEFT  10/24/2017   IR US GUIDE VASC ACCESS RIGHT  10/20/2017   RENAL BIOPSY  11/07/2011   TEE WITHOUT CARDIOVERSION N/A 03/31/2020   Procedure: TRANSESOPHAGEAL  ECHOCARDIOGRAM (TEE);  Surgeon: Lelon Perla, MD;  Location[facility] ENDOSCOPY;  Service: Cardiovascular;  Laterality: N/A;     reports that he has been smoking cigars and cigarettes. He has a 50.00 pack-year smoking history. He has never used smokeless tobacco. He reports current drug use. Drugs: Marijuana and Cocaine. He reports that he does not drink alcohol.  No Known Allergies  Family History  Problem Relation Age of Onset   Heart attack Father     Prior to Admission medications   Medication Sig Start Date End Date Taking? Authorizing Provider  acetaminophen (TYLENOL) 500 MG tablet Take 1 tablet (500 mg total) by mouth every 8 (eight) hours as needed for mild pain or fever. 6 times a day, 500 mg, 12 to 24 tabs a day, 6,000 to 12,000 mg daily for pain 08/05/16  Yes Molt, Bethany, DO  albuterol (VENTOLIN HFA) 108 (90 Base) MCG/ACT inhaler Inhale 3-6 puffs into the lungs every 6 (six) hours as needed for wheezing or shortness of breath.    Yes [provider]  apixaban (ELIQUIS) 5 MG TABS tablet Take 1 tablet (5 mg total) by mouth 2 (two) times daily. 01/28/21 02/27/21 Yes Cleaver, Jossie Ng, NP  carvedilol (COREG) 3.125 MG tablet Take 2 tablets (6.25 mg total) by mouth 2 (two) times daily with a meal. 12/20/20  Yes Sharion Settler, DO  diphenhydrAMINE (BENADRYL) 25 mg capsule Take 25 mg by mouth every 6 (six) hours as needed for itching.   Yes [provider]  hydrALAZINE (APRESOLINE) 25 MG tablet Take 1 tablet (25 mg total) by mouth 3 (three) times daily. Patient taking differently: Take 25 mg by mouth daily. 05/26/20  Yes Weaver, Scott T, PA-C  hydrocortisone cream 1 % Apply 1 application topically 2 (two) times daily as needed for itching.   Yes [provider]  hydrOXYzine (ATARAX/VISTARIL) 25 MG tablet Take 50 mg by mouth 3 (three) times daily as needed for itching.   Yes [provider]  pantoprazole (PROTONIX) 40 MG tablet TAKE 1 TABLET (40 MG TOTAL)  BY MOUTH DAILY. Patient taking differently: Take 40 mg by mouth daily. 04/01/20 04/01/21 Yes Ezequiel Essex, MD  sevelamer carbonate (RENVELA) 800 MG tablet Take 2 tablets (1,600 mg total) by mouth 3 (three) times daily with meals. Patient taking differently: Take 800-24,000 mg by mouth See admin instructions. 24000 mg three times daily with meals, and then '800mg'$  twice daily with snacks 01/25/14  Yes McLean-Scocuzza, Nino Glow, MD  sodium zirconium cyclosilicate (LOKELMA) 10 g PACK packet Take 10 g by mouth daily as needed (high potassium).   Yes [provider]  SPIRIVA RESPIMAT 2.5 MCG/ACT AERS Take 2 puffs by mouth daily. 01/28/21  Yes [provider]  tolnaftate (TINACTIN) 1 % spray Apply 1 application topically 2 (two) times daily as needed (itching on arms/back).   Yes [provider]  doxercalciferol (HECTOROL) 0.5 MCG capsule Doxercalciferol (Hectorol) 08/05/20 08/04/21  [provider]  Methoxy PEG-Epoetin Beta (MIRCERA IJ) Mircera 06/15/20 06/14/21  [provider]    Physical Exam:  Constitutional: Middle-age male currently in  no acute distress Vitals:   01/29/21 1430 01/29/21 1443 01/29/21 1445 01/29/21 1455  BP: (!) 130/99  (!) 137/93   Pulse: 81 (!) 136 (!) 137 (!) 117  Resp: 19 18 (!) 27 (!) 21  Temp:      TempSrc:      SpO2: 94% 93%  94%   Eyes: PERRL, lids and conjunctivae normal ENMT: Mucous membranes are moist. Posterior pharynx clear of any exudate or lesions.  Neck: normal, supple, no masses, no thyromegaly Respiratory: clear to auscultation bilaterally, no wheezing, no crackles. Normal respiratory effort. No accessory muscle use.  Cardiovascular: Regular rate and rhythm s/p cardioversion. No extremity edema. 2+ pedal pulses. No carotid bruits.  Left forearm fistula with palpable thrill. Abdomen: no tenderness, no masses palpated. No hepatosplenomegaly. Bowel sounds positive.  Musculoskeletal: no clubbing / cyanosis. No joint  deformity upper and lower extremities. Good ROM, no contractures. Normal muscle tone.  Skin: no rashes, lesions, ulcers. No induration Neurologic: CN 2-12 grossly intact. Sensation intact, DTR normal. Strength 5/5 in all 4.  Psychiatric: Normal judgment and insight. Alert and oriented x 3.  Agitated mood.     Labs on Admission: I have personally reviewed following labs and imaging studies  CBC: Recent Labs  Lab 01/29/21 1156  WBC 5.4  NEUTROABS 3.7  HGB 9.4*  HCT 30.4*  MCV 103.1*  PLT [id number]*   Basic Metabolic Panel: Recent Labs  Lab 01/29/21 1156  NA 135  K 5.6*  CL 92*  CO2 25  GLUCOSE 81  BUN 75*  CREATININE 13.57*  CALCIUM 10.7*   GFR: CrCl cannot be calculated (Unknown ideal weight.). Liver Function Tests: Recent Labs  Lab 01/29/21 1156  AST 10*  ALT 10  ALKPHOS 66  BILITOT 1.0  PROT 7.1  ALBUMIN 3.8   No results for input(s): LIPASE, AMYLASE in the last 168 hours. No results for input(s): AMMONIA in the last 168 hours. Coagulation Profile: No results for input(s): INR, PROTIME in the last 168 hours. Cardiac Enzymes: No results for input(s): CKTOTAL, CKMB, CKMBINDEX, TROPONINI in the last 168 hours. BNP (last 3 results) No results for input(s): PROBNP in the last 8760 hours. HbA1C: No results for input(s): HGBA1C in the last 72 hours. CBG: No results for input(s): GLUCAP in the last 168 hours. Lipid Profile: No results for input(s): CHOL, HDL, LDLCALC, TRIG, CHOLHDL, LDLDIRECT in the last 72 hours. Thyroid Function Tests: No results for input(s): TSH, T4TOTAL, FREET4, T3FREE, THYROIDAB in the last 72 hours. Anemia Panel: No results for input(s): VITAMINB12, FOLATE, FERRITIN, TIBC, IRON, RETICCTPCT in the last 72 hours. Urine analysis:    Component Value Date/Time   COLORURINE YELLOW 11/04/2011 1410   APPEARANCEUR CLOUDY (A) 11/04/2011 1410   LABSPEC 1.014 11/04/2011 1410   PHURINE 5.0 11/04/2011 1410   GLUCOSEU NEGATIVE 11/04/2011 1410    HGBUR LARGE (A) 11/04/2011 1410   BILIRUBINUR NEGATIVE 11/04/2011 1410   KETONESUR NEGATIVE 11/04/2011 1410   PROTEINUR 100 (A) 11/04/2011 1410   UROBILINOGEN 0.2 11/04/2011 1410   NITRITE NEGATIVE 11/04/2011 1410   LEUKOCYTESUR TRACE (A) 11/04/2011 1410   Sepsis Labs: No results found for this or any previous visit (from the past 240 hour(s)).   Radiological Exams on Admission: DG Chest 2 View  Result Date: 01/29/2021 CLINICAL DATA:  Chest pain and shortness of breath. Tachycardia during hemodialysis 2 days ago. History of atrial fibrillation. EXAM: CHEST - 2 VIEW COMPARISON:  Radiographs 12/18/2020 and 03/29/2020. CT 07/25/2015 and 01/20/2020. FINDINGS: Stable cardiomegaly,  bilateral hilar prominence and diffuse interstitial prominence with central airway and fissural thickening. There are chronic calcified mediastinal and hilar lymph nodes and calcified granulomas bilaterally. No definite superimposed edema, confluent airspace opacity, pleural effusion or pneumothorax. The bones appear unchanged. IMPRESSION: Grossly stable chronic interstitial lung disease with sequela of prior granulomatous infection. If the etiology for this has not been diagnosed, consider follow-up high-resolution chest CT. No acute superimposed process identified. Electronically Signed   By: Richardean Sale M.D.   On: 01/29/2021 12:47    EKG: Independently reviewed.  Atrial fibrillation with RVR at 123 bpm with left axis deviation.  Assessment/Plan Atrial fibrillation with RVR on chronic anticoagulation: Patient was found to be in atrial fibrillation with RVR with heart rates elevated into the 130s on admission.  Patient had reported compliance with Coreg given bolus of diltiazem 10 mg IV and started on IV drip. CHA2DS2-VASc score =2-3.  Patient was cardioverted and noted to be in sinus rhythm at this time. -Admit to a progressive bed -Discontinue Cardizem drip -Goal potassium at least 4 and magnesium 2 -Continue  Eliquis and Coreg per cardiology recommendation -[place] cardiology consultative services, we will follow-up for any further recommendation  Elevated troponin: Chronic.  High-sensitivity troponin 74->68.  Suspect secondary to demand in setting of atrial fibrillation with RVR. -Continue to monitor telemetry overnight  Hyperkalemia: Acute.  Initial potassium elevated at 5.6.  Patient had been given 10 g of Lokelma po. -Continue to monitor potassium levels  ESRD on HD: Patient normally dialyzes Monday, Wednesday, Friday.  He reportedly had a half session 2 days ago and was directed to the emergency department today due to heart rates.  Labs were significant for potassium 5.6, BUN 75, creatinine 13.57, and calcium 10.7.  Nephrology was formally consulted. -Continue current medication regimen -HD per nephrology  Chronic distolic heart failure: Last EF noted to be 55 to 60% with indeterminate diastolic parameters on [id number]. -Fluid management per HD  Anemia of chronic kidney disease: Hemoglobin 9.4 g/dL which appears slightly lower than baseline which previously had been around 10 g/dL.  Patient had been noted to have elevated MCV and RDW -Check B12, folate, and iron studies and -Recheck CBC tomorrow morning  Thrombocytopenia: Acute.  Platelet count 143 on admission. -Continue to monitor  Tobacco abuse -Nicotine patch offered  DVT prophylaxis: Eliquis Code Status: Full Family Communication: None requested Disposition Plan: Possibly discharge home tomorrow morning Consults called: Cardiology and nephrology Admission status: Observation  Norval Morton MD [facility]   If 7PM-7AM, please contact night-coverage   01/29/2021, 3:10 PM

## 2021-01-29 NOTE — ED Provider Notes (Signed)
Emergency Medicine Provider Triage Evaluation Note  Jeremy Mccoy , a 55 y.o. male  was evaluated in triage.  Pt complains of shortness of breath, palpitations and chest pain.  Symptoms have been going on for the past 2 days.  Was at dialysis 2 days ago but unfortunately had to cut his session early due to his symptoms.  He was told to come to the ER for evaluation.  Tried to go to dialysis today but they did not complete the session due to being symptomatic.  Has been taking medications as prescribed.  Also complaining of everything having a burning taste and smell, itching throughout his entire body" just not feeling well."  Denies any vomiting or fevers.  Review of Systems  Positive: Chest pain, palpitations, shortness of breath Negative: Abdominal pain, vomit  Physical Exam  BP (!) 136/113 (BP Location: Left Arm)   Pulse 61   Temp 98.1 F (36.7 C) (Oral)   Resp (!) 24   SpO2 98%  Gen:   Awake, no distress   Resp:  Normal effort  MSK:   Moves extremities without difficulty  Other:  No signs of respiratory distress lungs are clear bilaterally  Medical Decision Making  Medically screening exam initiated at 11:55 AM.  Appropriate orders placed.  Jeremy Mccoy was informed that the remainder of the evaluation will be completed by another provider, this initial triage assessment does not replace that evaluation, and the importance of remaining in the ED until their evaluation is complete.   Work-up initiated   Delia Heady, PA-C 01/29/21 1157    Valarie Merino, MD 01/30/21 1759

## 2021-01-30 DIAGNOSIS — I5032 Chronic diastolic (congestive) heart failure: Secondary | ICD-10-CM | POA: Diagnosis not present

## 2021-01-30 DIAGNOSIS — Z992 Dependence on renal dialysis: Secondary | ICD-10-CM | POA: Diagnosis not present

## 2021-01-30 DIAGNOSIS — N186 End stage renal disease: Secondary | ICD-10-CM | POA: Diagnosis not present

## 2021-01-30 DIAGNOSIS — J42 Unspecified chronic bronchitis: Secondary | ICD-10-CM

## 2021-01-30 DIAGNOSIS — I4891 Unspecified atrial fibrillation: Secondary | ICD-10-CM | POA: Diagnosis not present

## 2021-01-30 LAB — CBC
HCT: 28.9 % — ABNORMAL LOW (ref 39.0–52.0)
Hemoglobin: 9 g/dL — ABNORMAL LOW (ref 13.0–17.0)
MCH: 31.5 pg (ref 26.0–34.0)
MCHC: 31.1 g/dL (ref 30.0–36.0)
MCV: 101 fL — ABNORMAL HIGH (ref 80.0–100.0)
Platelets: 127 10*3/uL — ABNORMAL LOW (ref 150–400)
RBC: 2.86 MIL/uL — ABNORMAL LOW (ref 4.22–5.81)
RDW: 16.5 % — ABNORMAL HIGH (ref 11.5–15.5)
WBC: 5.9 10*3/uL (ref 4.0–10.5)
nRBC: 0 % (ref 0.0–0.2)

## 2021-01-30 LAB — RENAL FUNCTION PANEL
Albumin: 3.7 g/dL (ref 3.5–5.0)
Anion gap: 21 — ABNORMAL HIGH (ref 5–15)
BUN: 91 mg/dL — ABNORMAL HIGH (ref 6–20)
CO2: 22 mmol/L (ref 22–32)
Calcium: 10.3 mg/dL (ref 8.9–10.3)
Chloride: 90 mmol/L — ABNORMAL LOW (ref 98–111)
Creatinine, Ser: 14.33 mg/dL — ABNORMAL HIGH (ref 0.61–1.24)
GFR, Estimated: 4 mL/min — ABNORMAL LOW (ref >60)
Glucose, Bld: 70 mg/dL (ref 70–99)
Phosphorus: 8.5 mg/dL — ABNORMAL HIGH (ref 2.5–4.6)
Potassium: 5.2 mmol/L — ABNORMAL HIGH (ref 3.5–5.1)
Sodium: 133 mmol/L — ABNORMAL LOW (ref 135–145)

## 2021-01-30 LAB — VITAMIN B12: Vitamin B-12: 286 pg/mL (ref 180–914)

## 2021-01-30 LAB — IRON AND TIBC
Iron: 64 ug/dL (ref 45–182)
Saturation Ratios: 20 % (ref 17.9–39.5)
TIBC: 323 ug/dL (ref 250–450)
UIBC: 259 ug/dL

## 2021-01-30 LAB — FERRITIN: Ferritin: 624 ng/mL — ABNORMAL HIGH (ref 24–336)

## 2021-01-30 LAB — HEPATITIS B SURFACE ANTIBODY,QUALITATIVE: Hep B S Ab: REACTIVE — AB

## 2021-01-30 LAB — MAGNESIUM: Magnesium: 2.7 mg/dL — ABNORMAL HIGH (ref 1.7–2.4)

## 2021-01-30 LAB — FOLATE: Folate: 9.5 ng/mL (ref >5.9)

## 2021-01-30 MED ORDER — CYANOCOBALAMIN 1000 MCG/ML IJ SOLN
1000.0000 ug | Freq: Once | INTRAMUSCULAR | Status: AC
  Administered 2021-01-30: 1000 ug via SUBCUTANEOUS
  Filled 2021-01-30: qty 1

## 2021-01-30 MED ORDER — CLOTRIMAZOLE 1 % EX CREA: TOPICAL_CREAM | Freq: Two times a day (BID) | CUTANEOUS | 0 refills | Status: AC | PRN

## 2021-01-30 MED ORDER — CYANOCOBALAMIN 1000 MCG PO TABS: 1000.0000 ug | ORAL_TABLET | Freq: Every day | ORAL | 0 refills | Status: AC

## 2021-01-30 MED ORDER — CARVEDILOL 12.5 MG PO TABS
12.5000 mg | ORAL_TABLET | Freq: Two times a day (BID) | ORAL | Status: DC
  Administered 2021-01-30: 12.5 mg via ORAL
  Filled 2021-01-30: qty 1

## 2021-01-30 MED ORDER — CARVEDILOL 12.5 MG PO TABS: 12.5000 mg | ORAL_TABLET | Freq: Two times a day (BID) | ORAL | 0 refills | Status: DC

## 2021-01-30 MED ORDER — VITAMIN B-12 1000 MCG PO TABS: 1000.0000 ug | ORAL_TABLET | Freq: Every day | ORAL | Status: DC

## 2021-01-30 NOTE — Progress Notes (Signed)
  [facility] Progress Note   Subjective:  S/p cardioversion in the ED yesterday. SR HR 90s this am.  UF goal 4L, tolerating so far. Endorses some SOB.   Objective Vitals:   01/30/21 1113 01/30/21 1123 01/30/21 1150 01/30/21 1206  BP: (!) 149/106  (!) 150/113   Pulse: 90   93  Resp: (!) 26  17   Temp: 97.9 F (36.6 C)     TempSrc: Oral     SpO2: 92%     Weight:  69.9 kg    Height:         Additional Objective Labs: Basic Metabolic Panel: Recent Labs  Lab 01/29/21 1156 01/30/21 0253  NA 135 133*  K 5.6* 5.2*  CL 92* 90*  CO2 25 22  GLUCOSE 81 70  BUN 75* 91*  CREATININE 13.57* 14.33*  CALCIUM 10.7* 10.3  PHOS  --  8.5*   CBC: Recent Labs  Lab 01/29/21 1156 01/30/21 0253  WBC 5.4 5.9  NEUTROABS 3.7  --   HGB 9.4* 9.0*  HCT 30.4* 28.9*  MCV 103.1* 101.0*  PLT 141* 127*   Blood Culture    Component Value Date/Time   SDES BLOOD SITE NOT SPECIFIED 03/25/2020 1008   SPECREQUEST  03/25/2020 1008    BOTTLES DRAWN AEROBIC AND ANAEROBIC Blood Culture results may not be optimal due to an inadequate volume of blood received in culture bottles   CULT  03/25/2020 1008    NO GROWTH 5 DAYS Performed at [facility], [place] [address], [place], [place] [address]    REPTSTATUS 03/30/2020 FINAL 03/25/2020 1008     Physical Exam General: Lying in bed, nad  Heart: RRR No rub  Lungs: Diminished RLL; occasional crackles  Abdomen: soft non-tender  Extremities: No LE edema  Dialysis Access: LUE AVF +bruit   Medications:   apixaban  5 mg Oral BID   carvedilol  12.5 mg Oral BID WC   cyanocobalamin  1,000 mcg Subcutaneous Once   nicotine  21 mg Transdermal Daily   pantoprazole  40 mg Oral Daily   sevelamer carbonate  2,400 mg Oral TID WC   sodium chloride flush  3 mL Intravenous Q12H   umeclidinium bromide  1 puff Inhalation Daily   [START ON 01/31/2021] vitamin B-12  1,000 mcg Oral Daily    Dialysis Orders:  AF MWF 4h 450/1.5x EDW 71kg  1K/2.5Ca  Hectorol 7 TIW Parsbiv '15mg'$  TIW Venofer '100mg'$  IV x 10 (4/10 doses received)  Mircera 225 mcg IV q 2 wks (last 9/5)  Assessment/Plan: AFib w RVR - On Eliquis, Coreg. S/p cardioversion 9/16. Cardiology signed off. Has f/u appt  with [facility] on 9/122 ESRD -  HD MWF. Continue on schedule. Next HD 9/19  Hypertension/volume  -  BP not to goal. No gross volume on exam. Got under EDW here.   Anemia  - Hgb stable. On ESA/Fe load as outpatient. Continue if remains in hospital   Metabolic bone disease -  Continue home binders/Vit D     Lynnda Child PA-C [facility] 01/30/2021,1:41 PM

## 2021-01-30 NOTE — Progress Notes (Signed)
ANTICOAGULATION CONSULT NOTE - Follow Up Consult  Pharmacy Consult for apixaban Indication: atrial fibrillation  No Known Allergies  Patient Measurements: Height: 6' (182.9 cm) Weight: 69.9 kg (154 lb 1.6 oz) (stainding) IBW/kg (Calculated) : 77.6  Vital Signs: Temp: 97.9 F (36.6 C) (09/17 1113) Temp Source: Oral (09/17 1113) BP: 150/113 (09/17 1150) Pulse Rate: 93 (09/17 1206)  Labs: Recent Labs    01/29/21 1156 01/29/21 1435 01/30/21 0253  HGB 9.4*  --  9.0*  HCT 30.4*  --  28.9*  PLT 141*  --  127*  CREATININE 13.57*  --  14.33*  TROPONINIHS 74* 68*  --     Estimated Creatinine Clearance: 5.8 mL/min (A) (by C-G formula based on SCr of 14.33 mg/dL (H)).  Assessment: 55 yo male with afib and ESRD on HD, presented with AFib RVR and cardioverted in the ER.   Patient's H/H remains low; however, this does appear to be his baseline. Otherwise, CBC stable.   Goal of Therapy:  Primary stroke prevention Monitor platelets by anticoagulation protocol: Yes   Plan:  -Continue apixaban 5 mg twice daily -Monitor s/sx of bleeding  Pauletta Browns, Pharm.D. PGY-1 Pharmacy Resident [id number] 01/30/2021 1:16 PM

## 2021-01-30 NOTE — Progress Notes (Signed)
   01/30/21 1113  Vitals  Temp 97.9 F (36.6 C)  Temp Source Oral  BP (!) 149/106  MAP (mmHg) 119  BP Location Right Arm  BP Method Automatic  Patient Position (if appropriate) Lying  Pulse Rate 90  Pulse Rate Source Monitor  ECG Heart Rate 90  Resp (!) 26  Oxygen Therapy  SpO2 92 %  O2 Device Room Air  Post-Hemodialysis Assessment  Rinseback Volume (mL) 250 mL  KECN 267 V  Dialyzer Clearance Lightly streaked  Duration of HD Treatment -hour(s) 3 hour(s)  Hemodialysis Intake (mL) 500 mL  UF Total -Machine (mL) 4500 mL  Net UF (mL) 4000 mL  Tolerated HD Treatment Yes  Post-Hemodialysis Comments tx complete-pt stable  HD tx complete-pt stable. Tx decreased to 3 hours per Dr. Jonnie Finner.

## 2021-01-30 NOTE — Progress Notes (Signed)
DAILY PROGRESS NOTE   Patient Name: Jeremy Mccoy Date of Encounter: 01/30/2021 Cardiologist: Fransico Him, MD  Chief Complaint   No complaints  Patient Profile   55 yo male with afib and ESRD on HD, presented with AFib RVR and cardioverted in the ER  Subjective   Maintaining sinus rhythm/sinus tachycardia today. Anticoagulated on Eliquis.   Objective   Vitals:   01/30/21 1113 01/30/21 1123 01/30/21 1150 01/30/21 1206  BP: (!) 149/106  (!) 150/113   Pulse: 90   93  Resp: (!) 26  17   Temp: 97.9 F (36.6 C)     TempSrc: Oral     SpO2: 92%     Weight:  69.9 kg    Height:        Intake/Output Summary (Last 24 hours) at 01/30/2021 1254 Last data filed at 01/30/2021 1113 Gross per 24 hour  Intake 23.85 ml  Output 4000 ml  Net -3976.15 ml   Filed Weights   01/30/21 0400 01/30/21 0752 01/30/21 1123  Weight: 73.1 kg 74.2 kg 69.9 kg    Physical Exam   General appearance: alert and no distress Neck: no carotid bruit, no JVD, and thyroid not enlarged, symmetric, no tenderness/mass/nodules Lungs: clear to auscultation bilaterally Heart: regular rate and rhythm Abdomen: soft, non-tender; bowel sounds normal; no masses,  no organomegaly and scaphoid Extremities: extremities normal, atraumatic, no cyanosis or edema Pulses: 2+ and symmetric Skin: Skin color, texture, turgor normal. No rashes or lesions Neurologic: Grossly normal   Inpatient Medications    Scheduled Meds:  apixaban  5 mg Oral BID   carvedilol  6.25 mg Oral BID WC   cyanocobalamin  1,000 mcg Subcutaneous Once   nicotine  21 mg Transdermal Daily   pantoprazole  40 mg Oral Daily   sevelamer carbonate  2,400 mg Oral TID WC   sodium chloride flush  3 mL Intravenous Q12H   umeclidinium bromide  1 puff Inhalation Daily   [START ON 01/31/2021] vitamin B-12  1,000 mcg Oral Daily    Continuous Infusions:   PRN Meds: acetaminophen **OR** acetaminophen, albuterol, clotrimazole, diphenhydrAMINE,  hydrocortisone cream, hydrOXYzine, ondansetron **OR** ondansetron (ZOFRAN) IV, sevelamer carbonate **AND** sevelamer carbonate   Labs   Results for orders placed or performed during the hospital encounter of 01/29/21 (from the past 48 hour(s))  Comprehensive metabolic panel     Status: Abnormal   Collection Time: 01/29/21 11:56 AM  Result Value Ref Range   Sodium 135 135 - 145 mmol/L   Potassium 5.6 (H) 3.5 - 5.1 mmol/L   Chloride 92 (L) 98 - 111 mmol/L   CO2 25 22 - 32 mmol/L   Glucose, Bld 81 70 - 99 mg/dL    Comment: Glucose reference range applies only to samples taken after fasting for at least 8 hours.   BUN 75 (H) 6 - 20 mg/dL   Creatinine, Ser 13.57 (H) 0.61 - 1.24 mg/dL   Calcium 10.7 (H) 8.9 - 10.3 mg/dL   Total Protein 7.1 6.5 - 8.1 g/dL   Albumin 3.8 3.5 - 5.0 g/dL   AST 10 (L) 15 - 41 U/L   ALT 10 0 - 44 U/L   Alkaline Phosphatase 66 38 - 126 U/L   Total Bilirubin 1.0 0.3 - 1.2 mg/dL   GFR, Estimated 4 (L) >60 mL/min    Comment: (NOTE) Calculated using the CKD-EPI Creatinine Equation (2021)    Anion gap 18 (H) 5 - 15    Comment: Performed at  [facility], [place] [address] [place], [place] [address]  CBC with Differential     Status: Abnormal   Collection Time: 01/29/21 11:56 AM  Result Value Ref Range   WBC 5.4 4.0 - 10.5 K/uL   RBC 2.95 (L) 4.22 - 5.81 MIL/uL   Hemoglobin 9.4 (L) 13.0 - 17.0 g/dL   HCT 30.4 (L) 39.0 - 52.0 %   MCV 103.1 (H) 80.0 - 100.0 fL   MCH 31.9 26.0 - 34.0 pg   MCHC 30.9 30.0 - 36.0 g/dL   RDW 16.8 (H) 11.5 - 15.5 %   Platelets 141 (L) 150 - 400 K/uL   nRBC 0.0 0.0 - 0.2 %   Neutrophils Relative % 69 %   Neutro Abs 3.7 1.7 - 7.7 K/uL   Lymphocytes Relative 20 %   Lymphs Abs 1.1 0.7 - 4.0 K/uL   Monocytes Relative 9 %   Monocytes Absolute 0.5 0.1 - 1.0 K/uL   Eosinophils Relative 1 %   Eosinophils Absolute 0.1 0.0 - 0.5 K/uL   Basophils Relative 1 %   Basophils Absolute 0.1 0.0 - 0.1 K/uL   Immature Granulocytes 0 %   Abs  Immature Granulocytes 0.01 0.00 - 0.07 K/uL    Comment: Performed at [facility], [address] [place], [place] [address]  Troponin I (High Sensitivity)     Status: Abnormal   Collection Time: 01/29/21 11:56 AM  Result Value Ref Range   Troponin I (High Sensitivity) 74 (H) <18 ng/L    Comment: (NOTE) Elevated high sensitivity troponin I (hsTnI) values and significant  changes across serial measurements may suggest ACS but many other  chronic and acute conditions are known to elevate hsTnI results.  Refer to the "Links" section for chest pain algorithms and additional  guidance. Performed at [facility], [place] [address] [place], [place] [address]   Resp Panel by RT-PCR (Flu A&B, Covid) Nasopharyngeal Swab     Status: None   Collection Time: 01/29/21 12:28 PM   Specimen: Nasopharyngeal Swab; Nasopharyngeal(NP) swabs in vial transport medium  Result Value Ref Range   SARS Coronavirus 2 by RT PCR NEGATIVE NEGATIVE    Comment: (NOTE) SARS-CoV-2 target nucleic acids are NOT DETECTED.  The SARS-CoV-2 RNA is generally detectable in upper respiratory specimens during the acute phase of infection. The lowest concentration of SARS-CoV-2 viral copies this assay can detect is 138 copies/mL. A negative result does not preclude SARS-Cov-2 infection and should not be used as the sole basis for treatment or other patient management decisions. A negative result may occur with  improper specimen collection/handling, submission of specimen other than nasopharyngeal swab, presence of viral mutation(s) within the areas targeted by this assay, and inadequate number of viral copies(<138 copies/mL). A negative result must be combined with clinical observations, patient history, and epidemiological information. The expected result is Negative.  Fact Sheet for Patients:  EntrepreneurPulse.com.au  Fact Sheet for Healthcare Providers:   IncredibleEmployment.be  This test is no t yet approved or cleared by the [place] FDA and  has been authorized for detection and/or diagnosis of SARS-CoV-2 by FDA under an Emergency Use Authorization (EUA). This EUA will remain  in effect (meaning this test can be used) for the duration of the COVID-19 declaration under Section 564(b)(1) of the Act, 21 U.S.C.section 360bbb-3(b)(1), unless the authorization is terminated  or revoked sooner.       Influenza A by PCR NEGATIVE NEGATIVE   Influenza B  by PCR NEGATIVE NEGATIVE    Comment: (NOTE) The Xpert Xpress SARS-CoV-2/FLU/RSV plus assay is intended as an aid in the diagnosis of influenza from Nasopharyngeal swab specimens and should not be used as a sole basis for treatment. Nasal washings and aspirates are unacceptable for Xpert Xpress SARS-CoV-2/FLU/RSV testing.  Fact Sheet for Patients: EntrepreneurPulse.com.au  Fact Sheet for Healthcare Providers: IncredibleEmployment.be  This test is not yet approved or cleared by the [place] FDA and has been authorized for detection and/or diagnosis of SARS-CoV-2 by FDA under an Emergency Use Authorization (EUA). This EUA will remain in effect (meaning this test can be used) for the duration of the COVID-19 declaration under Section 564(b)(1) of the Act, 21 U.S.C. section 360bbb-3(b)(1), unless the authorization is terminated or revoked.  Performed at [facility], [place] [address], [place], [place] [address]   Troponin I (High Sensitivity)     Status: Abnormal   Collection Time: 01/29/21  2:35 PM  Result Value Ref Range   Troponin I (High Sensitivity) 68 (H) <18 ng/L    Comment: (NOTE) Elevated high sensitivity troponin I (hsTnI) values and significant  changes across serial measurements may suggest ACS but many other  chronic and acute conditions are known to elevate hsTnI results.  Refer to the "Links" section  for chest pain algorithms and additional  guidance. Performed at [facility], [place] [address] [place], [place] [address]   Hepatitis B surface antigen     Status: None   Collection Time: 01/29/21 10:21 PM  Result Value Ref Range   Hepatitis B Surface Ag NON REACTIVE NON REACTIVE    Comment: Performed at [place] [address], [place], [place] [address]  Hepatitis B surface antibody     Status: Abnormal   Collection Time: 01/29/21 10:21 PM  Result Value Ref Range   Hep B S Ab Reactive (A) NON REACTIVE    Comment: (NOTE) Consistent with immunity, greater than 9.9 mIU/mL.  Performed at [facility], [place] [address], [place], [place] [address]   CBC     Status: Abnormal   Collection Time: 01/30/21  2:53 AM  Result Value Ref Range   WBC 5.9 4.0 - 10.5 K/uL   RBC 2.86 (L) 4.22 - 5.81 MIL/uL   Hemoglobin 9.0 (L) 13.0 - 17.0 g/dL   HCT 28.9 (L) 39.0 - 52.0 %   MCV 101.0 (H) 80.0 - 100.0 fL   MCH 31.5 26.0 - 34.0 pg   MCHC 31.1 30.0 - 36.0 g/dL   RDW 16.5 (H) 11.5 - 15.5 %   Platelets 127 (L) 150 - 400 K/uL   nRBC 0.0 0.0 - 0.2 %    Comment: Performed at [facility], [place] [address], [place], [place] [address]  Magnesium     Status: Abnormal   Collection Time: 01/30/21  2:53 AM  Result Value Ref Range   Magnesium 2.7 (H) 1.7 - 2.4 mg/dL    Comment: Performed at [place] [address], [place], [place] [address]  Renal function panel     Status: Abnormal   Collection Time: 01/30/21  2:53 AM  Result Value Ref Range   Sodium 133 (L) 135 - 145 mmol/L   Potassium 5.2 (H) 3.5 - 5.1 mmol/L   Chloride 90 (L) 98 - 111 mmol/L   CO2 22 22 - 32 mmol/L   Glucose, Bld 70 70 - 99 mg/dL    Comment: Glucose reference  range applies only to samples taken after fasting for at least 8 hours.   BUN 91 (H) 6 - 20 mg/dL   Creatinine, Ser 14.33 (H) 0.61 - 1.24 mg/dL   Calcium 10.3 8.9 - 10.3 mg/dL   Phosphorus 8.5 (H) 2.5 - 4.6 mg/dL   Albumin 3.7 3.5 -  5.0 g/dL   GFR, Estimated 4 (L) >60 mL/min    Comment: (NOTE) Calculated using the CKD-EPI Creatinine Equation (2021)    Anion gap 21 (H) 5 - 15    Comment: REPEATED TO VERIFY Performed at [place] [address], [place], [place] [address]   Iron and TIBC     Status: None   Collection Time: 01/30/21  2:53 AM  Result Value Ref Range   Iron 64 45 - 182 ug/dL   TIBC 323 250 - 450 ug/dL   Saturation Ratios 20 17.9 - 39.5 %   UIBC 259 ug/dL    Comment: Performed at [facility], [place] [address], [place], [place] [address]  Ferritin     Status: Abnormal   Collection Time: 01/30/21  2:53 AM  Result Value Ref Range   Ferritin 624 (H) 24 - 336 ng/mL    Comment: Performed at [facility], [place] [address], [place], [place] [address]  Vitamin B12     Status: None   Collection Time: 01/30/21  2:53 AM  Result Value Ref Range   Vitamin B-12 286 180 - 914 pg/mL    Comment: (NOTE) This assay is not validated for testing neonatal or myeloproliferative syndrome specimens for Vitamin B12 levels. Performed at [facility], [place] [address], [place], [place] [address]   Folate, serum, performed at [facility]     Status: None   Collection Time: 01/30/21  2:53 AM  Result Value Ref Range   Folate 9.5 >5.9 ng/mL    Comment: Performed at [facility], [place] [address], [place], [place] [address]    ECG   N/A  Telemetry   Sinus tachycardia at 105 - Personally Reviewed  Radiology    DG Chest 2 View  Result Date: 01/29/2021 CLINICAL DATA:  Chest pain and shortness of breath. Tachycardia during hemodialysis 2 days ago. History of atrial fibrillation. EXAM: CHEST - 2 VIEW COMPARISON:  Radiographs 12/18/2020 and 03/29/2020. CT 07/25/2015 and 01/20/2020. FINDINGS: Stable cardiomegaly, bilateral hilar prominence and diffuse interstitial prominence with central airway and fissural thickening. There are chronic calcified mediastinal and hilar lymph nodes and calcified  granulomas bilaterally. No definite superimposed edema, confluent airspace opacity, pleural effusion or pneumothorax. The bones appear unchanged. IMPRESSION: Grossly stable chronic interstitial lung disease with sequela of prior granulomatous infection. If the etiology for this has not been diagnosed, consider follow-up high-resolution chest CT. No acute superimposed process identified. Electronically Signed   By: Richardean Sale M.D.   On: 01/29/2021 12:47    Cardiac Studies   N/A  Assessment   Principal Problem:   Atrial fibrillation with RVR (HCC) Active Problems:   Tobacco abuse   Anemia in chronic kidney disease   ESRD on hemodialysis (HCC)   Thrombocytopenia (HCC)   Chronic diastolic congestive heart failure (HCC)   COPD (chronic obstructive pulmonary disease) (HCC)   Hyperkalemia   Plan   AFib with RVR - now in sinus after DCCV. Continue Eliquis 5 mg BID. Was on diltiazem gtts and stopped. HR is elevated along with BP Today - just had dialysis. On coreg 6.25  mg BID - would recommend increasing that to 12.5 mg BID.  Has afib clinic follow-up on 9/22 already scheduled.  [facility] will sign off.   Medication Recommendations:  increase coreg to 12.5 mg BID, continue Eliquis 5 mg BID Other recommendations (labs, testing, etc):  none Follow up as an outpatient:  Adline Peals, PA-C in the afib clinic on 9/22   Time Spent Directly with Patient:  I have spent a total of 25 minutes with the patient reviewing hospital notes, telemetry, EKGs, labs and examining the patient as well as establishing an assessment and plan that was discussed personally with the patient.  > 50% of time was spent in direct patient care.  Length of Stay:  LOS: 0 days   Pixie Casino, MD, [facility], [place] Director of the Advanced Lipid Disorders &  Cardiovascular Risk Reduction Clinic Diplomate of the American Board of Clinical Lipidology Attending Cardiologist   Direct Dial: [phone]  Fax: [phone]  Website:  [email or web address] 01/30/2021, 12:54 PM

## 2021-01-30 NOTE — Discharge Summary (Signed)
Discharge Summary  Jeremy Mccoy [id number] DOB: 12/15/1965  PCP: Patient, No Pcp Per (Inactive)  Admit date: 01/29/2021 Discharge date: 01/30/2021  Time spent: 30 mins  Recommendations for Outpatient Follow-up:  PCP in 1 week Cardiology follow up on 02/04/21   Discharge Diagnoses:  Active Hospital Problems   Diagnosis Date Noted   Atrial fibrillation with RVR ([place]) 03/25/2020   Hyperkalemia 02/23/2016   Chronic diastolic congestive heart failure ([place]) 01/31/2014   COPD (chronic obstructive pulmonary disease) ([place]) 01/31/2014   Thrombocytopenia ([place]) 01/23/2014   ESRD on hemodialysis ([place]) 11/05/2013   Anemia in chronic kidney disease 09/20/2012   Tobacco abuse 11/04/2011    Resolved Hospital Problems  No resolved problems to display.    Discharge Condition: Stable  Diet recommendation: Renal diet  Vitals:   01/30/21 1206 01/30/21 1402  BP:  132/86  Pulse: 93 89  Resp:  19  Temp:  98.1 F (36.7 C)  SpO2:  96%    History of present illness:  Jeremy Mccoy is a 55 y.o. male with medical history significant of atrial fibrillation, HTN, HFrEF, ESRD on HD, COPD, and history of polysubstance abuse who presents with complaints of palpitations and  shortness of breath.  He had gone to hemodialysis 2 days ago, but halfway through his session had developed a fast heart rate for which the treatment was stopped.  He was directed to go to the emergency department at that time, but he did not come in.  He continued to have palpitations with some shortness of breath, and chest discomfort. Attempted to go to HD PTA, was advised to go to the ED. Patient had just been hospitalized from 8/5-8/7 with A. fib with RVR. In the ED, noted to be in Afib with RVR, HR 137, BP noted to be uncontrolled. Labs fairly stable. Chest x-ray noted gross chronic interstitial lung disease with sequela of prior granulomatous infection. Cardiology consulted, cardioverted patient back into sinus rhythm.  Nephrology recommended admission for HD.    Today, pt reports feeling better. Denies any chest pain, SOB, palpitations. Reports chronic generalized itching. Pt stable for discharge with close follow up with cardiology. Pt advised to be compliant with HD and medications.     Hospital Course:  Principal Problem:   Atrial fibrillation with RVR (HCC) Active Problems:   Tobacco abuse   Anemia in chronic kidney disease   ESRD on hemodialysis (HCC)   Thrombocytopenia (HCC)   Chronic diastolic congestive heart failure (HCC)   COPD (chronic obstructive pulmonary disease) (HCC)   Hyperkalemia    P. atrial fib with RVR on chronic anticoagulation HR with better control s/p cardizem drip + cardioversion on 01/29/21 Continue Eliquis and increased Coreg per cardiology recommendation Close follow up with cardiology   Elevated troponin Chronic High-sensitivity troponin 74->68, flat trend   ESRD on HD Hyperkalemia- management per HD, lokelma HD management per nephrology   Chronic distolic heart failure Last EF noted to be 55 to 60% with indeterminate diastolic parameters on [id number] Fluid management per HD   Vitamin B12 def Vit B12-->286 S/p  vit b12, continue oral supplementation     Thrombocytopenia Outpt follow up   Tobacco abuse/polysubstance abuse Advised to quit     Estimated body mass index is 20.9 kg/m as calculated from the following:   Height as of this encounter: 6' (1.829 m).   Weight as of this encounter: 69.9 kg.    Procedures: Cardioversion  Consultations: Cardiology Nephrology  Discharge Exam: BP 132/86 (BP  Location: Right Arm)   Pulse 89   Temp 98.1 F (36.7 C) (Oral)   Resp 19   Ht 6' (1.829 m)   Wt 69.9 kg Comment: stainding  SpO2 96%   BMI 20.90 kg/m   General: NAD  Cardiovascular: S1, S2 present Respiratory: CTAB Abdomen: Soft, nontender, nondistended, bowel sounds present Musculoskeletal: No bilateral pedal edema noted Skin:  Normal Psychiatry: Normal mood    Discharge Instructions You were cared for by a hospitalist during your hospital stay. If you have any questions about your discharge medications or the care you received while you were in the hospital after you are discharged, you can call the unit and asked to speak with the hospitalist on call if the hospitalist that took care of you is not available. Once you are discharged, your primary care physician will handle any further medical issues. Please note that NO REFILLS for any discharge medications will be authorized once you are discharged, as it is imperative that you return to your primary care physician (or establish a relationship with a primary care physician if you do not have one) for your aftercare needs so that they can reassess your need for medications and monitor your lab values.  Discharge Instructions     Diet - low sodium heart healthy   Complete by: As directed    Increase activity slowly   Complete by: As directed       Allergies as of 01/30/2021   No Known Allergies      Medication List     TAKE these medications    acetaminophen 500 MG tablet Commonly known as: TYLENOL Take 1 tablet (500 mg total) by mouth every 8 (eight) hours as needed for mild pain or fever. 6 times a day, 500 mg, 12 to 24 tabs a day, 6,000 to 12,000 mg daily for pain   albuterol 108 (90 Base) MCG/ACT inhaler Commonly known as: VENTOLIN HFA Inhale 3-6 puffs into the lungs every 6 (six) hours as needed for wheezing or shortness of breath.   apixaban 5 MG Tabs tablet Commonly known as: ELIQUIS Take 1 tablet (5 mg total) by mouth 2 (two) times daily.   carvedilol 12.5 MG tablet Commonly known as: COREG Take 1 tablet (12.5 mg total) by mouth 2 (two) times daily with a meal. What changed:  medication strength how much to take   clotrimazole 1 % cream Commonly known as: LOTRIMIN Apply topically 2 (two) times daily as needed (itching). Back and chest    cyanocobalamin 1000 MCG tablet Take 1 tablet (1,000 mcg total) by mouth daily. Start taking on: January 31, 2021   diphenhydrAMINE 25 mg capsule Commonly known as: BENADRYL Take 25 mg by mouth every 6 (six) hours as needed for itching.   doxercalciferol 0.5 MCG capsule Commonly known as: HECTOROL Doxercalciferol (Hectorol)   hydrALAZINE 25 MG tablet Commonly known as: APRESOLINE Take 1 tablet (25 mg total) by mouth 3 (three) times daily. What changed: when to take this   hydrocortisone cream 1 % Apply 1 application topically 2 (two) times daily as needed for itching.   hydrOXYzine 25 MG tablet Commonly known as: ATARAX/VISTARIL Take 50 mg by mouth 3 (three) times daily as needed for itching.   Lokelma 10 g Pack packet Generic drug: sodium zirconium cyclosilicate Take 10 g by mouth daily as needed (high potassium).   MIRCERA IJ Mircera   pantoprazole 40 MG tablet Commonly known as: PROTONIX TAKE 1 TABLET (40 MG TOTAL) BY MOUTH  DAILY. What changed: how much to take   sevelamer carbonate 800 MG tablet Commonly known as: RENVELA Take 2 tablets (1,600 mg total) by mouth 3 (three) times daily with meals. What changed:  how much to take when to take this additional instructions   Spiriva Respimat 2.5 MCG/ACT Aers Generic drug: Tiotropium Bromide Monohydrate Take 2 puffs by mouth daily.   tolnaftate 1 % spray Commonly known as: TINACTIN Apply 1 application topically 2 (two) times daily as needed (itching on arms/back).       No Known Allergies  Follow-up Information     [facility] Follow up on 02/04/2021.   Specialty: Cardiology Why: '@11am'$  for afib follow up Contact information: [address] [id number] [place] [phone]        Sueanne Margarita, MD. Schedule an appointment as soon as possible for a visit in 1 week(s).   Specialty: Cardiology Contact information: [id number] [address] [place] [address] [phone]                  The results of significant diagnostics from this hospitalization (including imaging, microbiology, ancillary and laboratory) are listed below for reference.    Significant Diagnostic Studies: DG Chest 2 View  Result Date: 01/29/2021 CLINICAL DATA:  Chest pain and shortness of breath. Tachycardia during hemodialysis 2 days ago. History of atrial fibrillation. EXAM: CHEST - 2 VIEW COMPARISON:  Radiographs 12/18/2020 and 03/29/2020. CT 07/25/2015 and 01/20/2020. FINDINGS: Stable cardiomegaly, bilateral hilar prominence and diffuse interstitial prominence with central airway and fissural thickening. There are chronic calcified mediastinal and hilar lymph nodes and calcified granulomas bilaterally. No definite superimposed edema, confluent airspace opacity, pleural effusion or pneumothorax. The bones appear unchanged. IMPRESSION: Grossly stable chronic interstitial lung disease with sequela of prior granulomatous infection. If the etiology for this has not been diagnosed, consider follow-up high-resolution chest CT. No acute superimposed process identified. Electronically Signed   By: Richardean Sale M.D.   On: 01/29/2021 12:47    Microbiology: Recent Results (from the past 240 hour(s))  Resp Panel by RT-PCR (Flu A&B, Covid) Nasopharyngeal Swab     Status: None   Collection Time: 01/29/21 12:28 PM   Specimen: Nasopharyngeal Swab; Nasopharyngeal(NP) swabs in vial transport medium  Result Value Ref Range Status   SARS Coronavirus 2 by RT PCR NEGATIVE NEGATIVE Final    Comment: (NOTE) SARS-CoV-2 target nucleic acids are NOT DETECTED.  The SARS-CoV-2 RNA is generally detectable in upper respiratory specimens during the acute phase of infection. The lowest concentration of SARS-CoV-2 viral copies this assay can detect is 138 copies/mL. A negative result does not preclude SARS-Cov-2 infection and should not be used as the sole basis for  treatment or other patient management decisions. A negative result may occur with  improper specimen collection/handling, submission of specimen other than nasopharyngeal swab, presence of viral mutation(s) within the areas targeted by this assay, and inadequate number of viral copies(<138 copies/mL). A negative result must be combined with clinical observations, patient history, and epidemiological information. The expected result is Negative.  Fact Sheet for Patients:  EntrepreneurPulse.com.au  Fact Sheet for Healthcare Providers:  IncredibleEmployment.be  This test is no t yet approved or cleared by the [place] FDA and  has been authorized for detection and/or diagnosis of SARS-CoV-2 by FDA under an Emergency Use Authorization (EUA). This EUA will remain  in effect (meaning this test can be used) for the duration of the COVID-19  declaration under Section 564(b)(1) of the Act, 21 U.S.C.section 360bbb-3(b)(1), unless the authorization is terminated  or revoked sooner.       Influenza A by PCR NEGATIVE NEGATIVE Final   Influenza B by PCR NEGATIVE NEGATIVE Final    Comment: (NOTE) The Xpert Xpress SARS-CoV-2/FLU/RSV plus assay is intended as an aid in the diagnosis of influenza from Nasopharyngeal swab specimens and should not be used as a sole basis for treatment. Nasal washings and aspirates are unacceptable for Xpert Xpress SARS-CoV-2/FLU/RSV testing.  Fact Sheet for Patients: EntrepreneurPulse.com.au  Fact Sheet for Healthcare Providers: IncredibleEmployment.be  This test is not yet approved or cleared by the [place] FDA and has been authorized for detection and/or diagnosis of SARS-CoV-2 by FDA under an Emergency Use Authorization (EUA). This EUA will remain in effect (meaning this test can be used) for the duration of the COVID-19 declaration under Section 564(b)(1) of the Act, 21  U.S.C. section 360bbb-3(b)(1), unless the authorization is terminated or revoked.  Performed at [facility], [place] [address], [place], [place] [address]      Labs: Basic Metabolic Panel: Recent Labs  Lab 01/29/21 1156 01/30/21 0253  NA 135 133*  K 5.6* 5.2*  CL 92* 90*  CO2 25 22  GLUCOSE 81 70  BUN 75* 91*  CREATININE 13.57* 14.33*  CALCIUM 10.7* 10.3  MG  --  2.7*  PHOS  --  8.5*   Liver Function Tests: Recent Labs  Lab 01/29/21 1156 01/30/21 0253  AST 10*  --   ALT 10  --   ALKPHOS 66  --   BILITOT 1.0  --   PROT 7.1  --   ALBUMIN 3.8 3.7   No results for input(s): LIPASE, AMYLASE in the last 168 hours. No results for input(s): AMMONIA in the last 168 hours. CBC: Recent Labs  Lab 01/29/21 1156 01/30/21 0253  WBC 5.4 5.9  NEUTROABS 3.7  --   HGB 9.4* 9.0*  HCT 30.4* 28.9*  MCV 103.1* 101.0*  PLT 141* 127*   Cardiac Enzymes: No results for input(s): CKTOTAL, CKMB, CKMBINDEX, TROPONINI in the last 168 hours. BNP: BNP (last 3 results) No results for input(s): BNP in the last 8760 hours.  ProBNP (last 3 results) No results for input(s): PROBNP in the last 8760 hours.  CBG: No results for input(s): GLUCAP in the last 168 hours.     Signed:  Alma Friendly, MD [facility] 01/30/2021, 3:35 PM

## 2021-01-31 ENCOUNTER — Telehealth: Payer: Self-pay | Admitting: Nephrology

## 2021-01-31 NOTE — Telephone Encounter (Signed)
Transition of care contact from inpatient facility  Date of discharge: 01/30/21 Date of contact: 01/31/21 Method: Phone Spoke to: Patient  Patient contacted to discuss transition of care from recent inpatient hospitalization. Patient was admitted to [facility] from 9/16-9/17/22 with discharge diagnosis of AFib with RVR.   Medication changes were reviewed.  Patient will follow up with his/her outpatient HD unit on: Monday 9/19.

## 2021-02-01 LAB — HEPATITIS B SURFACE ANTIBODY, QUANTITATIVE: Hep B S AB Quant (Post): 82.4 m[IU]/mL (ref >9.9)

## 2021-02-03 NOTE — Telephone Encounter (Signed)
Pt has appt on 9/22/.cy

## 2021-02-04 ENCOUNTER — Ambulatory Visit (HOSPITAL_COMMUNITY)
Admit: 2021-02-04 | Discharge: 2021-02-04 | Disposition: A | Discharge status: Home / Self Care | Payer: Medicare Other | Source: Ambulatory Visit | Attending: Physician Assistant | Admitting: Physician Assistant

## 2021-02-04 ENCOUNTER — Encounter (HOSPITAL_COMMUNITY): Payer: Self-pay | Admitting: Physician Assistant

## 2021-02-04 ENCOUNTER — Other Ambulatory Visit: Payer: Self-pay

## 2021-02-04 VITALS — BP 134/80 | HR 88 | Ht 72.0 in | Wt 159.8 lb

## 2021-02-04 DIAGNOSIS — D6869 Other thrombophilia: Secondary | ICD-10-CM

## 2021-02-04 DIAGNOSIS — I480 Paroxysmal atrial fibrillation: Secondary | ICD-10-CM

## 2021-02-04 DIAGNOSIS — I120 Hypertensive chronic kidney disease with stage 5 chronic kidney disease or end stage renal disease: Secondary | ICD-10-CM | POA: Diagnosis not present

## 2021-02-04 DIAGNOSIS — F17210 Nicotine dependence, cigarettes, uncomplicated: Secondary | ICD-10-CM | POA: Diagnosis not present

## 2021-02-04 DIAGNOSIS — I4892 Unspecified atrial flutter: Secondary | ICD-10-CM | POA: Diagnosis not present

## 2021-02-04 DIAGNOSIS — Z992 Dependence on renal dialysis: Secondary | ICD-10-CM | POA: Insufficient documentation

## 2021-02-04 DIAGNOSIS — J449 Chronic obstructive pulmonary disease, unspecified: Secondary | ICD-10-CM | POA: Diagnosis not present

## 2021-02-04 DIAGNOSIS — Z7901 Long term (current) use of anticoagulants: Secondary | ICD-10-CM | POA: Diagnosis not present

## 2021-02-04 DIAGNOSIS — Z8249 Family history of ischemic heart disease and other diseases of the circulatory system: Secondary | ICD-10-CM | POA: Insufficient documentation

## 2021-02-04 DIAGNOSIS — Z79899 Other long term (current) drug therapy: Secondary | ICD-10-CM | POA: Insufficient documentation

## 2021-02-04 DIAGNOSIS — N186 End stage renal disease: Secondary | ICD-10-CM | POA: Insufficient documentation

## 2021-02-04 MED ORDER — METOPROLOL SUCCINATE ER 50 MG PO TB24: 50.0000 mg | ORAL_TABLET | Freq: Every day | ORAL | 3 refills | Status: AC

## 2021-02-04 NOTE — Patient Instructions (Signed)
STOP coreg (carvedilol)  START metoprolol (toprol) '50mg'$  once a day

## 2021-02-04 NOTE — Progress Notes (Signed)
Primary Care Physician: Patient, No Pcp Per (Inactive) Primary Cardiologist: Dr Radford Pax Primary Electrophysiologist: none Referring Physician: Dr Marcene Corning is a 55 y.o. male with a history of tachycardia mediated CM, HTN, COPD, ESRD, HTN, tobacco abuse, atrial flutter, atrial fibrillation who presents for consultation in the [facility]. The patient was admitted with a flutter RVR November 2021.  Converted to sinus rhythm on TEE cardioversion.  He was treated with IV amiodarone and discharged on p.o.  Admitted again August 2022 with A. fib RVR in setting of noncompliance with anticoagulation. He was not taking amiodarone on admission.  He was placed on IV Cardizem and heparin.  Spontaneously converted to sinus rhythm. Elevated troponin felt demand ischemia.  Echocardiogram showed normal LV function at 55 to 60%, no wall motion abnormality and diastolic parameter were intermediate. Patient is on Eliquis for a CHADS2VASC score of 2. He presented to the ED on 01/29/21 with symptomatic afib and underwent DCCV at that time. His carvedilol was increased.   Patient reports that since then he has not had any heart racing or palpitations. He does report itchy skin since starting carvedilol. He denies any bleeding issues on anticoagulation.   Today, he denies symptoms of palpitations, chest pain, shortness of breath, orthopnea, PND, lower extremity edema, dizziness, presyncope, syncope, snoring, daytime somnolence, bleeding, or neurologic sequela. The patient is tolerating medications without difficulties and is otherwise without complaint today.    Atrial Fibrillation Risk Factors:  he does not have symptoms or diagnosis of sleep apnea. he does not have a history of rheumatic fever.   he has a BMI of Body mass index is 21.67 kg/m.Marland Kitchen Filed Weights   02/04/21 1057  Weight: 72.5 kg    Family History  Problem Relation Age of Onset   Heart attack Father       Atrial Fibrillation Management history:  Previous antiarrhythmic drugs: amiodarone  Previous cardioversions: 03/31/20, 01/29/21 Previous ablations: none CHADS2VASC score: 2 Anticoagulation history: Eliquis   Past Medical History:  Diagnosis Date   Anemia    Anxiety    Arthritis    "qwhere" (05/15/2013)   CHF (congestive heart failure) (HCC)    COPD (chronic obstructive pulmonary disease) ([place])    Crack cocaine use    Dental caries    Depression    ESRD (end stage renal disease) on dialysis ([place])    TTS: [address] [place] (05/15/2013)   Headache(784.0)    "get it from going to dialysis; when they get real bad I come to hospital" (05/15/2013)   Hematemesis/vomiting blood    History of blood transfusion 10/2011; 04/2013   HTN (hypertension) 06/12/2012   Seizures ([place])    Shortness of breath    "just when I have too much potassium is too high" (05/15/2013)   Past Surgical History:  Procedure Laterality Date   AV FISTULA PLACEMENT  11/29/2011   Procedure: ARTERIOVENOUS (AV) FISTULA CREATION;  Surgeon: Angelia Mould, MD;  Location: [facility];  Service: Vascular;  Laterality: Left;   CARDIOVERSION N/A 03/31/2020   Procedure: CARDIOVERSION;  Surgeon: Lelon Perla, MD;  Location[facility] ENDOSCOPY;  Service: Cardiovascular;  Laterality: N/A;   ESOPHAGOGASTRODUODENOSCOPY N/A 05/17/2013   Procedure: ESOPHAGOGASTRODUODENOSCOPY (EGD);  Surgeon: Beryle Beams, MD;  Location: [facility] ENDOSCOPY;  Service: Endoscopy;  Laterality: N/A;   INGUINAL HERNIA REPAIR Bilateral 1967   IR FLUORO GUIDE CV LINE RIGHT  10/20/2017   IR THROMBECTOMY AV FISTULA W/THROMBOLYSIS/PTA INC/SHUNT/IMG LEFT  Left 10/24/2017   IR US GUIDE VASC ACCESS LEFT  10/24/2017   IR US GUIDE VASC ACCESS RIGHT  10/20/2017   RENAL BIOPSY  11/07/2011   TEE WITHOUT CARDIOVERSION N/A 03/31/2020   Procedure: TRANSESOPHAGEAL ECHOCARDIOGRAM (TEE);  Surgeon: Lelon Perla, MD;  Location: [facility] ENDOSCOPY;  Service: Cardiovascular;   Laterality: N/A;    Current Outpatient Medications  Medication Sig Dispense Refill   acetaminophen (TYLENOL) 500 MG tablet Take 1 tablet (500 mg total) by mouth every 8 (eight) hours as needed for mild pain or fever. 6 times a day, 500 mg, 12 to 24 tabs a day, 6,000 to 12,000 mg daily for pain 30 tablet 0   albuterol (VENTOLIN HFA) 108 (90 Base) MCG/ACT inhaler Inhale 3-6 puffs into the lungs every 6 (six) hours as needed for wheezing or shortness of breath.      apixaban (ELIQUIS) 5 MG TABS tablet Take 1 tablet (5 mg total) by mouth 2 (two) times daily. 60 tablet 3   clotrimazole (LOTRIMIN) 1 % cream Apply topically 2 (two) times daily as needed (itching). Back and chest 30 g 0   diphenhydrAMINE (BENADRYL) 25 mg capsule Take 25 mg by mouth every 6 (six) hours as needed for itching.     doxercalciferol (HECTOROL) 0.5 MCG capsule Doxercalciferol (Hectorol)     hydrALAZINE (APRESOLINE) 25 MG tablet Take 1 tablet (25 mg total) by mouth 3 (three) times daily. 270 tablet 3   hydrocortisone cream 1 % Apply 1 application topically 2 (two) times daily as needed for itching.     hydrOXYzine (ATARAX/VISTARIL) 25 MG tablet Take 50 mg by mouth 3 (three) times daily as needed for itching.     Methoxy PEG-Epoetin Beta (MIRCERA IJ) Mircera     metoprolol succinate (TOPROL XL) 50 MG 24 hr tablet Take 1 tablet (50 mg total) by mouth daily. Take with or immediately following a meal. 30 tablet 3   pantoprazole (PROTONIX) 40 MG tablet TAKE 1 TABLET (40 MG TOTAL) BY MOUTH DAILY. 30 tablet 0   sevelamer carbonate (RENVELA) 800 MG tablet Take 2 tablets (1,600 mg total) by mouth 3 (three) times daily with meals. 90 tablet 0   sodium zirconium cyclosilicate (LOKELMA) 10 g PACK packet Take 10 g by mouth daily as needed (high potassium).     SPIRIVA RESPIMAT 2.5 MCG/ACT AERS Take 2 puffs by mouth daily.     tolnaftate (TINACTIN) 1 % spray Apply 1 application topically 2 (two) times daily as needed (itching on arms/back).      vitamin B-12 1000 MCG tablet Take 1 tablet (1,000 mcg total) by mouth daily. 30 tablet 0   No current facility-administered medications for this encounter.    No Known Allergies  Social History   Socioeconomic History   Marital status: Single    Spouse name: Not on file   Number of children: 0   Years of education: 9 th   Highest education level: Not on file  Occupational History   Occupation: Corporate treasurer    Comment: Disabled  Tobacco Use   Smoking status: Every Day    Packs/day: 1.00    Years: 50.00    Pack years: 50.00    Types: Cigars, Cigarettes   Smokeless tobacco: Never   Tobacco comments:    1 pack daily (02/04/2021)  Vaping Use   Vaping Use: Never used  Substance and Sexual Activity   Alcohol use: No    Comment: 05/15/2013 "aien't drank in ~ 25 yrs"  Drug use: Yes    Types: Marijuana, Cocaine    Comment: Precription drugs (e.g. percocet) "I take what I have to to controll my constant pain" (05/15/2013)   Sexual activity: Never  Other Topics Concern   Not on file  Social History Narrative   ** Merged History Encounter **   Patient lives at home with his parents and he is disabled.   Education 9th grade education   Right handed   Caffeine one cup daily       Social Determinants of Health   Financial Resource Strain: Not on file  Food Insecurity: Not on file  Transportation Needs: Not on file  Physical Activity: Not on file  Stress: Not on file  Social Connections: Not on file  Intimate Partner Violence: Not on file     ROS- All systems are reviewed and negative except as per the HPI above.  Physical Exam: Vitals:   02/04/21 1057  BP: 134/80  Pulse: 88  Weight: 72.5 kg  Height: 6' (1.829 m)    GEN- The patient is a well appearing male, alert and oriented x 3 today.   Head- normocephalic, atraumatic Eyes-  Sclera clear, conjunctiva pink Ears- hearing intact Oropharynx- clear Neck- supple  Lungs- Clear to ausculation bilaterally,  normal work of breathing Heart- Regular rate and rhythm, no murmurs, rubs or gallops  GI- soft, NT, ND, + BS Extremities- no clubbing, cyanosis, or edema MS- no significant deformity or atrophy Skin- no rash or lesion Psych- euthymic mood, full affect Neuro- strength and sensation are intact  Wt Readings from Last 3 Encounters:  02/04/21 72.5 kg  01/30/21 69.9 kg  12/28/20 72.3 kg    EKG today demonstrates  SR Vent. rate 88 BPM PR interval 186 ms QRS duration 96 ms QT/QTcB 376/454 ms  Echo 12/19/20 demonstrated   1. Left ventricular ejection fraction, by estimation, is 55 to 60%. The  left ventricle has normal function. The left ventricle has no regional  wall motion abnormalities. The left ventricular internal cavity size was  mildly to moderately dilated. There  is mild left ventricular hypertrophy. Left ventricular diastolic  parameters are indeterminate.   2. Right ventricular systolic function is mildly reduced. The right  ventricular size is normal.   3. Left atrial size was severely dilated.   4. Right atrial size was moderately dilated.   5. Peak and mean gradients through the MV are 17 and 6 mm Hg respectively  consistent with moderate mitral stenosis Compared to echo report from  September 2021, no significant change . Mild mitral valve regurgitation.  Severe mitral annular calcification.   6. Aortic valve regurgitation is trivial. Mild to moderate aortic valve  sclerosis/calcification is present, without any evidence of aortic  stenosis.   7. The inferior vena cava is dilated in size with <50% respiratory  variability, suggesting right atrial pressure of 15 mmHg.   Epic records are reviewed at length today  CHA2DS2-VASc Score = 2  The patient's score is based upon: CHF History: 1 HTN History: 1 Diabetes History: 0 Stroke History: 0 Vascular Disease History: 0 Age Score: 0 Gender Score: 0       ASSESSMENT AND PLAN: 1. Paroxysmal Atrial Fibrillation  (ICD10:  I48.0) The patient's CHA2DS2-VASc score is 2, indicating a 2.2% annual risk of stroke.   General education about afib provided and questions answered. We also discussed his stroke risk and the risks and benefits of anticoagulation. Will change carvedilol to Toprol 50 mg daily  given potential side effects. We also briefly discussed amiodarone if rhythm control is needed. His AAD options are limited by ESRD. Continue Eliquis 5 mg BID.  2. Secondary Hypercoagulable State (ICD10:  D68.69) The patient is at significant risk for stroke/thromboembolism based upon his CHA2DS2-VASc Score of 2.  Continue Apixaban (Eliquis).   3. HRrEF Suspected tachycardia mediated CM EF recovered. No signs or symptoms of fluid overload.  4. HTN Stable, no changes today.  5. ESRD HD M/W/F   Follow up in the AF clinic in one month.    [facility] [address] [employer], [place] [address] [phone] 02/04/2021 12:31 PM

## 2021-02-25 ENCOUNTER — Other Ambulatory Visit: Payer: Self-pay | Admitting: Family Medicine

## 2021-02-26 ENCOUNTER — Encounter: Payer: Medicare Other | Admitting: Nurse Practitioner

## 2021-02-26 ENCOUNTER — Encounter: Payer: Self-pay | Admitting: Nurse Practitioner

## 2021-02-26 NOTE — Progress Notes (Signed)
This encounter was created in error - please disregard.

## 2021-03-01 ENCOUNTER — Encounter (HOSPITAL_COMMUNITY): Payer: Self-pay | Admitting: Radiology

## 2021-03-11 ENCOUNTER — Ambulatory Visit (HOSPITAL_COMMUNITY)
Admission: RE | Admit: 2021-03-11 | Discharge: 2021-03-11 | Disposition: A | Discharge status: Home / Self Care | Payer: Medicare Other | Source: Ambulatory Visit | Attending: Physician Assistant | Admitting: Physician Assistant

## 2021-03-11 ENCOUNTER — Other Ambulatory Visit: Payer: Self-pay

## 2021-03-11 ENCOUNTER — Encounter (HOSPITAL_COMMUNITY): Payer: Self-pay | Admitting: Physician Assistant

## 2021-03-11 VITALS — BP 114/74 | HR 99 | Ht 72.0 in | Wt 153.4 lb

## 2021-03-11 DIAGNOSIS — I10 Essential (primary) hypertension: Secondary | ICD-10-CM | POA: Diagnosis not present

## 2021-03-11 DIAGNOSIS — N186 End stage renal disease: Secondary | ICD-10-CM | POA: Insufficient documentation

## 2021-03-11 DIAGNOSIS — Z992 Dependence on renal dialysis: Secondary | ICD-10-CM | POA: Insufficient documentation

## 2021-03-11 DIAGNOSIS — I480 Paroxysmal atrial fibrillation: Secondary | ICD-10-CM | POA: Diagnosis not present

## 2021-03-11 DIAGNOSIS — F17210 Nicotine dependence, cigarettes, uncomplicated: Secondary | ICD-10-CM | POA: Insufficient documentation

## 2021-03-11 DIAGNOSIS — D6869 Other thrombophilia: Secondary | ICD-10-CM | POA: Diagnosis not present

## 2021-03-11 DIAGNOSIS — R000 Tachycardia, unspecified: Secondary | ICD-10-CM | POA: Insufficient documentation

## 2021-03-11 DIAGNOSIS — Z7901 Long term (current) use of anticoagulants: Secondary | ICD-10-CM | POA: Insufficient documentation

## 2021-03-11 NOTE — Progress Notes (Signed)
Primary Care Physician: Patient, No Pcp Per (Inactive) Primary Cardiologist: Dr Radford Pax Primary Electrophysiologist: none Referring Physician: Dr Marcene Corning is a 55 y.o. male with a history of tachycardia mediated CM, HTN, COPD, ESRD, HTN, tobacco abuse, atrial flutter, atrial fibrillation who presents for follow up in the [facility]. The patient was admitted with a flutter RVR November 2021.  Converted to sinus rhythm on TEE cardioversion.  He was treated with IV amiodarone and discharged on p.o.  Admitted again August 2022 with A. fib RVR in setting of noncompliance with anticoagulation. He was not taking amiodarone on admission.  He was placed on IV Cardizem and heparin.  Spontaneously converted to sinus rhythm. Elevated troponin felt demand ischemia.  Echocardiogram showed normal LV function at 55 to 60%, no wall motion abnormality and diastolic parameter were intermediate. Patient is on Eliquis for a CHADS2VASC score of 2. He presented to the ED on 01/29/21 with symptomatic afib and underwent DCCV at that time. His carvedilol was increased.   On follow up today, patient reports that he has done well from a cardiac standpoint. Itching resolved by switching to metoprolol. He has not had any heart racing in the interim. He does have headaches post HD but this is not new for him.   Today, he denies symptoms of palpitations, chest pain, shortness of breath, orthopnea, PND, lower extremity edema, dizziness, presyncope, syncope, snoring, daytime somnolence, bleeding, or neurologic sequela. The patient is tolerating medications without difficulties and is otherwise without complaint today.    Atrial Fibrillation Risk Factors:  he does not have symptoms or diagnosis of sleep apnea. he does not have a history of rheumatic fever.   he has a BMI of Body mass index is 20.8 kg/m.Marland Kitchen Filed Weights   03/11/21 1117  Weight: 69.6 kg     Family History   Problem Relation Age of Onset   Heart attack Father      Atrial Fibrillation Management history:  Previous antiarrhythmic drugs: amiodarone  Previous cardioversions: 03/31/20, 01/29/21 Previous ablations: none CHADS2VASC score: 2 Anticoagulation history: Eliquis   Past Medical History:  Diagnosis Date   Anemia    Anxiety    Arthritis    "qwhere" (05/15/2013)   CHF (congestive heart failure) (HCC)    COPD (chronic obstructive pulmonary disease) ([place])    Crack cocaine use    Dental caries    Depression    ESRD (end stage renal disease) on dialysis ([place])    TTS: [address] [place] (05/15/2013)   Headache(784.0)    "get it from going to dialysis; when they get real bad I come to hospital" (05/15/2013)   Hematemesis/vomiting blood    History of blood transfusion 10/2011; 04/2013   HTN (hypertension) 06/12/2012   Seizures ([place])    Shortness of breath    "just when I have too much potassium is too high" (05/15/2013)   Past Surgical History:  Procedure Laterality Date   AV FISTULA PLACEMENT  11/29/2011   Procedure: ARTERIOVENOUS (AV) FISTULA CREATION;  Surgeon: Angelia Mould, MD;  Location: [place];  Service: Vascular;  Laterality: Left;   CARDIOVERSION N/A 03/31/2020   Procedure: CARDIOVERSION;  Surgeon: Lelon Perla, MD;  Location[facility] ENDOSCOPY;  Service: Cardiovascular;  Laterality: N/A;   ESOPHAGOGASTRODUODENOSCOPY N/A 05/17/2013   Procedure: ESOPHAGOGASTRODUODENOSCOPY (EGD);  Surgeon: Beryle Beams, MD;  Location: [facility] ENDOSCOPY;  Service: Endoscopy;  Laterality: N/A;   INGUINAL HERNIA REPAIR Bilateral 1967  IR FLUORO GUIDE CV LINE RIGHT  10/20/2017   IR THROMBECTOMY AV FISTULA W/THROMBOLYSIS/PTA INC/SHUNT/IMG LEFT Left 10/24/2017   IR US GUIDE VASC ACCESS LEFT  10/24/2017   IR US GUIDE VASC ACCESS RIGHT  10/20/2017   RENAL BIOPSY  11/07/2011   TEE WITHOUT CARDIOVERSION N/A 03/31/2020   Procedure: TRANSESOPHAGEAL ECHOCARDIOGRAM (TEE);  Surgeon: Lelon Perla,  MD;  Location: [facility] ENDOSCOPY;  Service: Cardiovascular;  Laterality: N/A;    Current Outpatient Medications  Medication Sig Dispense Refill   acetaminophen (TYLENOL) 500 MG tablet Take 1 tablet (500 mg total) by mouth every 8 (eight) hours as needed for mild pain or fever. 6 times a day, 500 mg, 12 to 24 tabs a day, 6,000 to 12,000 mg daily for pain 30 tablet 0   albuterol (VENTOLIN HFA) 108 (90 Base) MCG/ACT inhaler Inhale 3-6 puffs into the lungs every 6 (six) hours as needed for wheezing or shortness of breath.      apixaban (ELIQUIS) 5 MG TABS tablet Take 1 tablet (5 mg total) by mouth 2 (two) times daily. 60 tablet 3   clotrimazole (LOTRIMIN) 1 % cream Apply topically 2 (two) times daily as needed (itching). Back and chest 30 g 0   diphenhydrAMINE (BENADRYL) 25 mg capsule Take 25 mg by mouth every 6 (six) hours as needed for itching.     doxercalciferol (HECTOROL) 0.5 MCG capsule Doxercalciferol (Hectorol)     hydrALAZINE (APRESOLINE) 25 MG tablet Take 1 tablet (25 mg total) by mouth 3 (three) times daily. 270 tablet 3   hydrocortisone cream 1 % Apply 1 application topically 2 (two) times daily as needed for itching.     hydrOXYzine (ATARAX/VISTARIL) 25 MG tablet Take 50 mg by mouth 3 (three) times daily as needed for itching.     Methoxy PEG-Epoetin Beta (MIRCERA IJ) Mircera     metoprolol succinate (TOPROL XL) 50 MG 24 hr tablet Take 1 tablet (50 mg total) by mouth daily. Take with or immediately following a meal. 30 tablet 3   pantoprazole (PROTONIX) 40 MG tablet TAKE 1 TABLET (40 MG TOTAL) BY MOUTH DAILY. 30 tablet 0   sevelamer carbonate (RENVELA) 800 MG tablet Take 2 tablets (1,600 mg total) by mouth 3 (three) times daily with meals. 90 tablet 0   sodium zirconium cyclosilicate (LOKELMA) 10 g PACK packet Take 10 g by mouth daily as needed (high potassium).     SPIRIVA RESPIMAT 2.5 MCG/ACT AERS Take 2 puffs by mouth daily.     tolnaftate (TINACTIN) 1 % spray Apply 1 application topically  2 (two) times daily as needed (itching on arms/back).     No current facility-administered medications for this encounter.    No Known Allergies  Social History   Socioeconomic History   Marital status: Single    Spouse name: Not on file   Number of children: 0   Years of education: 9 th   Highest education level: Not on file  Occupational History   Occupation: Corporate treasurer    Comment: Disabled  Tobacco Use   Smoking status: Every Day    Packs/day: 1.00    Years: 50.00    Pack years: 50.00    Types: Cigars, Cigarettes   Smokeless tobacco: Never   Tobacco comments:    1 pack daily (02/04/2021)  Vaping Use   Vaping Use: Never used  Substance and Sexual Activity   Alcohol use: No    Comment: 05/15/2013 "aien't drank in ~ 25 yrs"   Drug  use: Yes    Types: Marijuana, Cocaine    Comment: Precription drugs (e.g. percocet) "I take what I have to to controll my constant pain" (05/15/2013)   Sexual activity: Never  Other Topics Concern   Not on file  Social History Narrative   ** Merged History Encounter **   Patient lives at home with his parents and he is disabled.   Education 9th grade education   Right handed   Caffeine one cup daily       Social Determinants of Health   Financial Resource Strain: Not on file  Food Insecurity: Not on file  Transportation Needs: Not on file  Physical Activity: Not on file  Stress: Not on file  Social Connections: Not on file  Intimate Partner Violence: Not on file     ROS- All systems are reviewed and negative except as per the HPI above.  Physical Exam: Vitals:   03/11/21 1117  BP: 114/74  Pulse: 99  Weight: 69.6 kg  Height: 6' (1.829 m)    GEN- The patient is a well appearing male, alert and oriented x 3 today.   HEENT-head normocephalic, atraumatic, sclera clear, conjunctiva pink, hearing intact, trachea midline. Lungs- Clear to ausculation bilaterally, normal work of breathing Heart- Regular rate and rhythm, no  murmurs, rubs or gallops  GI- soft, NT, ND, + BS Extremities- no clubbing, cyanosis, or edema MS- no significant deformity or atrophy Skin- no rash or lesion Psych- euthymic mood, full affect Neuro- strength and sensation are intact   Wt Readings from Last 3 Encounters:  03/11/21 69.6 kg  02/04/21 72.5 kg  01/30/21 69.9 kg    EKG today demonstrates  SR, LAFB Vent. rate 99 BPM PR interval 170 ms QRS duration 90 ms QT/QTcB 346/444 ms  Echo 12/19/20 demonstrated   1. Left ventricular ejection fraction, by estimation, is 55 to 60%. The left ventricle has normal function. The left ventricle has no regional wall motion abnormalities. The left ventricular internal cavity size was mildly to moderately dilated. There is mild left ventricular hypertrophy. Left ventricular diastolic parameters are indeterminate.   2. Right ventricular systolic function is mildly reduced. The right  ventricular size is normal.   3. Left atrial size was severely dilated.   4. Right atrial size was moderately dilated.   5. Peak and mean gradients through the MV are 17 and 6 mm Hg respectively consistent with moderate mitral stenosis Compared to echo report from September 2021, no significant change . Mild mitral valve regurgitation. Severe mitral annular calcification.   6. Aortic valve regurgitation is trivial. Mild to moderate aortic valve  sclerosis/calcification is present, without any evidence of aortic  stenosis.   7. The inferior vena cava is dilated in size with <50% respiratory  variability, suggesting right atrial pressure of 15 mmHg.   Epic records are reviewed at length today  CHA2DS2-VASc Score = 2  The patient's score is based upon: CHF History: 1 HTN History: 1 Diabetes History: 0 Stroke History: 0 Vascular Disease History: 0 Age Score: 0 Gender Score: 0       ASSESSMENT AND PLAN: 1. Paroxysmal Atrial Fibrillation (ICD10:  I48.0) The patient's CHA2DS2-VASc score is 2, indicating a  2.2% annual risk of stroke.   Patient appears to be maintaining SR. Continue Toprol 50 mg daily. His AAD options are limited by ESRD. Continue Eliquis 5 mg BID.  2. Secondary Hypercoagulable State (ICD10:  D68.69) The patient is at significant risk for stroke/thromboembolism based upon his  CHA2DS2-VASc Score of 2.  Continue Apixaban (Eliquis).   3. HRrEF Suspected tachycardia mediated CM EF recovered. No signs or symptoms of fluid overload.  4. HTN Stable, no changes today.  5. ESRD HD M/W/F   Follow up with Dr Radford Pax as scheduled. AF clinic in 6 months.    [facility] [address] [place], [place] [address] [phone] 03/11/2021 11:24 AM

## 2021-03-18 ENCOUNTER — Ambulatory Visit: Payer: Medicare HMO | Admitting: Cardiology

## 2021-04-09 ENCOUNTER — Inpatient Hospital Stay (HOSPITAL_COMMUNITY)
Admission: EM | Admit: 2021-04-09 | Discharge: 2021-04-15 | DRG: 207 | Disposition: E | Payer: Medicare Other | Attending: Pulmonary Disease | Admitting: Pulmonary Disease

## 2021-04-09 ENCOUNTER — Inpatient Hospital Stay (HOSPITAL_COMMUNITY): Payer: Medicare Other

## 2021-04-09 ENCOUNTER — Emergency Department (HOSPITAL_COMMUNITY): Payer: Medicare Other

## 2021-04-09 ENCOUNTER — Encounter (HOSPITAL_COMMUNITY): Payer: Self-pay

## 2021-04-09 ENCOUNTER — Other Ambulatory Visit: Payer: Self-pay

## 2021-04-09 DIAGNOSIS — J449 Chronic obstructive pulmonary disease, unspecified: Secondary | ICD-10-CM | POA: Diagnosis present

## 2021-04-09 DIAGNOSIS — N2581 Secondary hyperparathyroidism of renal origin: Secondary | ICD-10-CM | POA: Diagnosis present

## 2021-04-09 DIAGNOSIS — I469 Cardiac arrest, cause unspecified: Secondary | ICD-10-CM | POA: Diagnosis present

## 2021-04-09 DIAGNOSIS — Z66 Do not resuscitate: Secondary | ICD-10-CM | POA: Diagnosis not present

## 2021-04-09 DIAGNOSIS — J42 Unspecified chronic bronchitis: Secondary | ICD-10-CM | POA: Diagnosis not present

## 2021-04-09 DIAGNOSIS — R55 Syncope and collapse: Secondary | ICD-10-CM | POA: Diagnosis present

## 2021-04-09 DIAGNOSIS — Z79899 Other long term (current) drug therapy: Secondary | ICD-10-CM

## 2021-04-09 DIAGNOSIS — R9431 Abnormal electrocardiogram [ECG] [EKG]: Secondary | ICD-10-CM | POA: Diagnosis present

## 2021-04-09 DIAGNOSIS — I480 Paroxysmal atrial fibrillation: Secondary | ICD-10-CM | POA: Diagnosis present

## 2021-04-09 DIAGNOSIS — E162 Hypoglycemia, unspecified: Secondary | ICD-10-CM | POA: Diagnosis present

## 2021-04-09 DIAGNOSIS — R7989 Other specified abnormal findings of blood chemistry: Secondary | ICD-10-CM | POA: Diagnosis present

## 2021-04-09 DIAGNOSIS — N186 End stage renal disease: Secondary | ICD-10-CM | POA: Diagnosis present

## 2021-04-09 DIAGNOSIS — Z992 Dependence on renal dialysis: Secondary | ICD-10-CM

## 2021-04-09 DIAGNOSIS — J9601 Acute respiratory failure with hypoxia: Secondary | ICD-10-CM | POA: Diagnosis present

## 2021-04-09 DIAGNOSIS — E872 Acidosis, unspecified: Secondary | ICD-10-CM | POA: Diagnosis present

## 2021-04-09 DIAGNOSIS — Z8616 Personal history of COVID-19: Secondary | ICD-10-CM

## 2021-04-09 DIAGNOSIS — G40301 Generalized idiopathic epilepsy and epileptic syndromes, not intractable, with status epilepticus: Secondary | ICD-10-CM | POA: Diagnosis present

## 2021-04-09 DIAGNOSIS — I468 Cardiac arrest due to other underlying condition: Secondary | ICD-10-CM | POA: Diagnosis present

## 2021-04-09 DIAGNOSIS — G40901 Epilepsy, unspecified, not intractable, with status epilepticus: Secondary | ICD-10-CM | POA: Diagnosis not present

## 2021-04-09 DIAGNOSIS — G936 Cerebral edema: Secondary | ICD-10-CM | POA: Diagnosis present

## 2021-04-09 DIAGNOSIS — Z0189 Encounter for other specified special examinations: Secondary | ICD-10-CM

## 2021-04-09 DIAGNOSIS — E871 Hypo-osmolality and hyponatremia: Secondary | ICD-10-CM | POA: Diagnosis present

## 2021-04-09 DIAGNOSIS — E875 Hyperkalemia: Secondary | ICD-10-CM | POA: Diagnosis not present

## 2021-04-09 DIAGNOSIS — D696 Thrombocytopenia, unspecified: Secondary | ICD-10-CM | POA: Diagnosis present

## 2021-04-09 DIAGNOSIS — G931 Anoxic brain damage, not elsewhere classified: Secondary | ICD-10-CM | POA: Diagnosis present

## 2021-04-09 DIAGNOSIS — I959 Hypotension, unspecified: Secondary | ICD-10-CM | POA: Diagnosis present

## 2021-04-09 DIAGNOSIS — J9602 Acute respiratory failure with hypercapnia: Secondary | ICD-10-CM | POA: Diagnosis present

## 2021-04-09 DIAGNOSIS — D631 Anemia in chronic kidney disease: Secondary | ICD-10-CM | POA: Diagnosis present

## 2021-04-09 DIAGNOSIS — Z8249 Family history of ischemic heart disease and other diseases of the circulatory system: Secondary | ICD-10-CM

## 2021-04-09 DIAGNOSIS — I10 Essential (primary) hypertension: Secondary | ICD-10-CM

## 2021-04-09 DIAGNOSIS — Z515 Encounter for palliative care: Secondary | ICD-10-CM

## 2021-04-09 DIAGNOSIS — I5032 Chronic diastolic (congestive) heart failure: Secondary | ICD-10-CM | POA: Diagnosis present

## 2021-04-09 DIAGNOSIS — I132 Hypertensive heart and chronic kidney disease with heart failure and with stage 5 chronic kidney disease, or end stage renal disease: Secondary | ICD-10-CM | POA: Diagnosis present

## 2021-04-09 DIAGNOSIS — Z9115 Patient's noncompliance with renal dialysis: Secondary | ICD-10-CM

## 2021-04-09 DIAGNOSIS — M898X9 Other specified disorders of bone, unspecified site: Secondary | ICD-10-CM | POA: Diagnosis present

## 2021-04-09 DIAGNOSIS — G253 Myoclonus: Secondary | ICD-10-CM | POA: Diagnosis present

## 2021-04-09 DIAGNOSIS — F17210 Nicotine dependence, cigarettes, uncomplicated: Secondary | ICD-10-CM | POA: Diagnosis present

## 2021-04-09 DIAGNOSIS — Z9289 Personal history of other medical treatment: Secondary | ICD-10-CM

## 2021-04-09 LAB — HEPARIN LEVEL (UNFRACTIONATED): Heparin Unfractionated: 0.92 IU/mL — ABNORMAL HIGH (ref 0.30–0.70)

## 2021-04-09 LAB — I-STAT ARTERIAL BLOOD GAS, ED
Acid-base deficit: 13 mmol/L — ABNORMAL HIGH (ref 0.0–2.0)
Acid-base deficit: 16 mmol/L — ABNORMAL HIGH (ref 0.0–2.0)
Acid-base deficit: 12 mmol/L — ABNORMAL HIGH (ref 0.0–2.0)
Acid-base deficit: 10 mmol/L — ABNORMAL HIGH (ref 0.0–2.0)
Acid-base deficit: 8 mmol/L — ABNORMAL HIGH (ref 0.0–2.0)
Bicarbonate: 15.5 mmol/L — ABNORMAL LOW (ref 20.0–28.0)
Bicarbonate: 18.1 mmol/L — ABNORMAL LOW (ref 20.0–28.0)
Bicarbonate: 17.1 mmol/L — ABNORMAL LOW (ref 20.0–28.0)
Bicarbonate: 16.5 mmol/L — ABNORMAL LOW (ref 20.0–28.0)
Bicarbonate: 19 mmol/L — ABNORMAL LOW (ref 20.0–28.0)
Calcium, Ion: 1.32 mmol/L (ref 1.15–1.40)
Calcium, Ion: 1.47 mmol/L — ABNORMAL HIGH (ref 1.15–1.40)
Calcium, Ion: 1.25 mmol/L (ref 1.15–1.40)
Calcium, Ion: 1.17 mmol/L (ref 1.15–1.40)
Calcium, Ion: 1.14 mmol/L — ABNORMAL LOW (ref 1.15–1.40)
HCT: 39 % (ref 39.0–52.0)
HCT: 42 % (ref 39.0–52.0)
HCT: 41 % (ref 39.0–52.0)
HCT: 39 % (ref 39.0–52.0)
HCT: 38 % — ABNORMAL LOW (ref 39.0–52.0)
Hemoglobin: 13.3 g/dL (ref 13.0–17.0)
Hemoglobin: 14.3 g/dL (ref 13.0–17.0)
Hemoglobin: 13.9 g/dL (ref 13.0–17.0)
Hemoglobin: 13.3 g/dL (ref 13.0–17.0)
Hemoglobin: 12.9 g/dL — ABNORMAL LOW (ref 13.0–17.0)
O2 Saturation: 100 %
O2 Saturation: 100 %
O2 Saturation: 98 %
O2 Saturation: 99 %
O2 Saturation: 100 %
Patient temperature: 95.4
Potassium: 6.7 mmol/L (ref 3.5–5.1)
Potassium: 7.1 mmol/L (ref 3.5–5.1)
Potassium: 7.5 mmol/L (ref 3.5–5.1)
Potassium: 7.2 mmol/L (ref 3.5–5.1)
Potassium: 6.3 mmol/L (ref 3.5–5.1)
Sodium: 131 mmol/L — ABNORMAL LOW (ref 135–145)
Sodium: 132 mmol/L — ABNORMAL LOW (ref 135–145)
Sodium: 131 mmol/L — ABNORMAL LOW (ref 135–145)
Sodium: 129 mmol/L — ABNORMAL LOW (ref 135–145)
Sodium: 131 mmol/L — ABNORMAL LOW (ref 135–145)
TCO2: 17 mmol/L — ABNORMAL LOW (ref 22–32)
TCO2: 20 mmol/L — ABNORMAL LOW (ref 22–32)
TCO2: 19 mmol/L — ABNORMAL LOW (ref 22–32)
TCO2: 18 mmol/L — ABNORMAL LOW (ref 22–32)
TCO2: 20 mmol/L — ABNORMAL LOW (ref 22–32)
pCO2 arterial: 61.7 mmHg — ABNORMAL HIGH (ref 32.0–48.0)
pCO2 arterial: 62.8 mmHg — ABNORMAL HIGH (ref 32.0–48.0)
pCO2 arterial: 50.2 mmHg — ABNORMAL HIGH (ref 32.0–48.0)
pCO2 arterial: 39.2 mmHg (ref 32.0–48.0)
pCO2 arterial: 41.3 mmHg (ref 32.0–48.0)
pH, Arterial: 6.999 — CL (ref 7.350–7.450)
pH, Arterial: 7.064 — CL (ref 7.350–7.450)
pH, Arterial: 7.139 — CL (ref 7.350–7.450)
pH, Arterial: 7.233 — ABNORMAL LOW (ref 7.350–7.450)
pH, Arterial: 7.27 — ABNORMAL LOW (ref 7.350–7.450)
pO2, Arterial: 489 mmHg — ABNORMAL HIGH (ref 83.0–108.0)
pO2, Arterial: 512 mmHg — ABNORMAL HIGH (ref 83.0–108.0)
pO2, Arterial: 141 mmHg — ABNORMAL HIGH (ref 83.0–108.0)
pO2, Arterial: 154 mmHg — ABNORMAL HIGH (ref 83.0–108.0)
pO2, Arterial: 207 mmHg — ABNORMAL HIGH (ref 83.0–108.0)

## 2021-04-09 LAB — BASIC METABOLIC PANEL
Anion gap: 22 — ABNORMAL HIGH (ref 5–15)
Anion gap: 17 — ABNORMAL HIGH (ref 5–15)
Anion gap: 17 — ABNORMAL HIGH (ref 5–15)
BUN: 119 mg/dL — ABNORMAL HIGH (ref 6–20)
BUN: 67 mg/dL — ABNORMAL HIGH (ref 6–20)
BUN: 66 mg/dL — ABNORMAL HIGH (ref 6–20)
CO2: 23 mmol/L (ref 22–32)
CO2: 24 mmol/L (ref 22–32)
CO2: 22 mmol/L (ref 22–32)
Calcium: 8.7 mg/dL — ABNORMAL LOW (ref 8.9–10.3)
Calcium: 8.7 mg/dL — ABNORMAL LOW (ref 8.9–10.3)
Calcium: 7.7 mg/dL — ABNORMAL LOW (ref 8.9–10.3)
Chloride: 89 mmol/L — ABNORMAL LOW (ref 98–111)
Chloride: 93 mmol/L — ABNORMAL LOW (ref 98–111)
Chloride: 85 mmol/L — ABNORMAL LOW (ref 98–111)
Creatinine, Ser: 14.58 mg/dL — ABNORMAL HIGH (ref 0.61–1.24)
Creatinine, Ser: 9.52 mg/dL — ABNORMAL HIGH (ref 0.61–1.24)
Creatinine, Ser: 9.02 mg/dL — ABNORMAL HIGH (ref 0.61–1.24)
GFR, Estimated: 4 mL/min — ABNORMAL LOW (ref >60)
GFR, Estimated: 6 mL/min — ABNORMAL LOW (ref >60)
GFR, Estimated: 6 mL/min — ABNORMAL LOW (ref >60)
Glucose, Bld: 141 mg/dL — ABNORMAL HIGH (ref 70–99)
Glucose, Bld: 109 mg/dL — ABNORMAL HIGH (ref 70–99)
Glucose, Bld: 112 mg/dL — ABNORMAL HIGH (ref 70–99)
Potassium: 6.8 mmol/L (ref 3.5–5.1)
Potassium: 4.3 mmol/L (ref 3.5–5.1)
Potassium: 5.2 mmol/L — ABNORMAL HIGH (ref 3.5–5.1)
Sodium: 134 mmol/L — ABNORMAL LOW (ref 135–145)
Sodium: 134 mmol/L — ABNORMAL LOW (ref 135–145)
Sodium: 124 mmol/L — ABNORMAL LOW (ref 135–145)

## 2021-04-09 LAB — GLUCOSE, CAPILLARY
Glucose-Capillary: 159 mg/dL — ABNORMAL HIGH (ref 70–99)
Glucose-Capillary: 136 mg/dL — ABNORMAL HIGH (ref 70–99)
Glucose-Capillary: 82 mg/dL (ref 70–99)
Glucose-Capillary: 85 mg/dL (ref 70–99)
Glucose-Capillary: 100 mg/dL — ABNORMAL HIGH (ref 70–99)
Glucose-Capillary: 110 mg/dL — ABNORMAL HIGH (ref 70–99)

## 2021-04-09 LAB — I-STAT VENOUS BLOOD GAS, ED
Acid-base deficit: 19 mmol/L — ABNORMAL HIGH (ref 0.0–2.0)
Bicarbonate: 13.8 mmol/L — ABNORMAL LOW (ref 20.0–28.0)
Calcium, Ion: 1.15 mmol/L (ref 1.15–1.40)
HCT: 42 % (ref 39.0–52.0)
Hemoglobin: 14.3 g/dL (ref 13.0–17.0)
O2 Saturation: 92 %
Potassium: 6.5 mmol/L (ref 3.5–5.1)
Sodium: 132 mmol/L — ABNORMAL LOW (ref 135–145)
TCO2: 16 mmol/L — ABNORMAL LOW (ref 22–32)
pCO2, Ven: 62.1 mmHg — ABNORMAL HIGH (ref 44.0–60.0)
pH, Ven: 6.954 — CL (ref 7.250–7.430)
pO2, Ven: 99 mmHg — ABNORMAL HIGH (ref 32.0–45.0)

## 2021-04-09 LAB — POCT I-STAT 7, (LYTES, BLD GAS, ICA,H+H)
Acid-base deficit: 2 mmol/L (ref 0.0–2.0)
Bicarbonate: 20.9 mmol/L (ref 20.0–28.0)
Calcium, Ion: 0.99 mmol/L — ABNORMAL LOW (ref 1.15–1.40)
HCT: 33 % — ABNORMAL LOW (ref 39.0–52.0)
Hemoglobin: 11.2 g/dL — ABNORMAL LOW (ref 13.0–17.0)
O2 Saturation: 100 %
Patient temperature: 97.8
Potassium: 6.3 mmol/L (ref 3.5–5.1)
Sodium: 128 mmol/L — ABNORMAL LOW (ref 135–145)
TCO2: 22 mmol/L (ref 22–32)
pCO2 arterial: 29.7 mmHg — ABNORMAL LOW (ref 32.0–48.0)
pH, Arterial: 7.454 — ABNORMAL HIGH (ref 7.350–7.450)
pO2, Arterial: 168 mmHg — ABNORMAL HIGH (ref 83.0–108.0)

## 2021-04-09 LAB — CBC WITH DIFFERENTIAL/PLATELET
Abs Immature Granulocytes: 0.23 10*3/uL — ABNORMAL HIGH (ref 0.00–0.07)
Basophils Absolute: 0.1 10*3/uL (ref 0.0–0.1)
Basophils Relative: 1 %
Eosinophils Absolute: 0.1 10*3/uL (ref 0.0–0.5)
Eosinophils Relative: 1 %
HCT: 43 % (ref 39.0–52.0)
Hemoglobin: 12.5 g/dL — ABNORMAL LOW (ref 13.0–17.0)
Immature Granulocytes: 2 %
Lymphocytes Relative: 25 %
Lymphs Abs: 2.7 10*3/uL (ref 0.7–4.0)
MCH: 29.1 pg (ref 26.0–34.0)
MCHC: 29.1 g/dL — ABNORMAL LOW (ref 30.0–36.0)
MCV: 100.2 fL — ABNORMAL HIGH (ref 80.0–100.0)
Monocytes Absolute: 0.5 10*3/uL (ref 0.1–1.0)
Monocytes Relative: 5 %
Neutro Abs: 7.2 10*3/uL (ref 1.7–7.7)
Neutrophils Relative %: 66 %
Platelets: 217 10*3/uL (ref 150–400)
RBC: 4.29 MIL/uL (ref 4.22–5.81)
RDW: 18.3 % — ABNORMAL HIGH (ref 11.5–15.5)
WBC: 10.7 10*3/uL — ABNORMAL HIGH (ref 4.0–10.5)
nRBC: 0 % (ref 0.0–0.2)

## 2021-04-09 LAB — TROPONIN I (HIGH SENSITIVITY)
Troponin I (High Sensitivity): 1531 ng/L (ref <18)
Troponin I (High Sensitivity): 1687 ng/L (ref <18)
Troponin I (High Sensitivity): 2372 ng/L (ref <18)

## 2021-04-09 LAB — I-STAT CHEM 8, ED
BUN: >130 mg/dL — ABNORMAL HIGH (ref 6–20)
Calcium, Ion: 1.15 mmol/L (ref 1.15–1.40)
Chloride: 102 mmol/L (ref 98–111)
Creatinine, Ser: 14.8 mg/dL — ABNORMAL HIGH (ref 0.61–1.24)
Glucose, Bld: 150 mg/dL — ABNORMAL HIGH (ref 70–99)
HCT: 44 % (ref 39.0–52.0)
Hemoglobin: 15 g/dL (ref 13.0–17.0)
Potassium: 6.4 mmol/L (ref 3.5–5.1)
Sodium: 133 mmol/L — ABNORMAL LOW (ref 135–145)
TCO2: 18 mmol/L — ABNORMAL LOW (ref 22–32)

## 2021-04-09 LAB — COMPREHENSIVE METABOLIC PANEL
ALT: 21 U/L (ref 0–44)
AST: 31 U/L (ref 15–41)
Albumin: 3.7 g/dL (ref 3.5–5.0)
Alkaline Phosphatase: 107 U/L (ref 38–126)
Anion gap: 26 — ABNORMAL HIGH (ref 5–15)
BUN: 114 mg/dL — ABNORMAL HIGH (ref 6–20)
CO2: 16 mmol/L — ABNORMAL LOW (ref 22–32)
Calcium: 10.4 mg/dL — ABNORMAL HIGH (ref 8.9–10.3)
Chloride: 93 mmol/L — ABNORMAL LOW (ref 98–111)
Creatinine, Ser: 14.79 mg/dL — ABNORMAL HIGH (ref 0.61–1.24)
GFR, Estimated: 4 mL/min — ABNORMAL LOW (ref >60)
Glucose, Bld: 128 mg/dL — ABNORMAL HIGH (ref 70–99)
Potassium: >7.5 mmol/L (ref 3.5–5.1)
Sodium: 135 mmol/L (ref 135–145)
Total Bilirubin: 0.9 mg/dL (ref 0.3–1.2)
Total Protein: 7.6 g/dL (ref 6.5–8.1)

## 2021-04-09 LAB — CBC
HCT: 36.9 % — ABNORMAL LOW (ref 39.0–52.0)
Hemoglobin: 11.6 g/dL — ABNORMAL LOW (ref 13.0–17.0)
MCH: 29.7 pg (ref 26.0–34.0)
MCHC: 31.4 g/dL (ref 30.0–36.0)
MCV: 94.6 fL (ref 80.0–100.0)
Platelets: 161 10*3/uL (ref 150–400)
RBC: 3.9 MIL/uL — ABNORMAL LOW (ref 4.22–5.81)
RDW: 18 % — ABNORMAL HIGH (ref 11.5–15.5)
WBC: 11.7 10*3/uL — ABNORMAL HIGH (ref 4.0–10.5)
nRBC: 0 % (ref 0.0–0.2)

## 2021-04-09 LAB — ETHANOL: Alcohol, Ethyl (B): <10 mg/dL (ref <10)

## 2021-04-09 LAB — HIV ANTIBODY (ROUTINE TESTING W REFLEX): HIV Screen 4th Generation wRfx: NONREACTIVE

## 2021-04-09 LAB — APTT: aPTT: 46 seconds — ABNORMAL HIGH (ref 24–36)

## 2021-04-09 LAB — RESP PANEL BY RT-PCR (FLU A&B, COVID) ARPGX2
Influenza A by PCR: NEGATIVE
Influenza B by PCR: NEGATIVE
SARS Coronavirus 2 by RT PCR: NEGATIVE

## 2021-04-09 LAB — MAGNESIUM
Magnesium: 3.1 mg/dL — ABNORMAL HIGH (ref 1.7–2.4)
Magnesium: 2.7 mg/dL — ABNORMAL HIGH (ref 1.7–2.4)

## 2021-04-09 LAB — MRSA NEXT GEN BY PCR, NASAL: MRSA by PCR Next Gen: NOT DETECTED

## 2021-04-09 LAB — LACTIC ACID, PLASMA: Lactic Acid, Venous: 7.2 mmol/L (ref 0.5–1.9)

## 2021-04-09 LAB — AMMONIA: Ammonia: 44 umol/L — ABNORMAL HIGH (ref 9–35)

## 2021-04-09 LAB — CBG MONITORING, ED: Glucose-Capillary: 181 mg/dL — ABNORMAL HIGH (ref 70–99)

## 2021-04-09 MED ORDER — SODIUM ZIRCONIUM CYCLOSILICATE 10 G PO PACK
10.0000 g | PACK | Freq: Once | ORAL | Status: AC
  Administered 2021-04-09: 10 g
  Filled 2021-04-09: qty 1

## 2021-04-09 MED ORDER — SODIUM ZIRCONIUM CYCLOSILICATE 10 G PO PACK
10.0000 g | PACK | Freq: Once | ORAL | Status: AC
  Administered 2021-04-09: 10 g

## 2021-04-09 MED ORDER — FOLIC ACID 5 MG/ML IJ SOLN
1.0000 mg | Freq: Every day | INTRAMUSCULAR | Status: DC
  Administered 2021-04-09 – 2021-04-13 (×5): 1 mg via INTRAVENOUS
  Filled 2021-04-09 (×8): qty 0.2

## 2021-04-09 MED ORDER — SODIUM ZIRCONIUM CYCLOSILICATE 10 G PO PACK
10.0000 g | PACK | Freq: Three times a day (TID) | ORAL | Status: DC
Start: 2021-04-10 — End: 2021-04-12
  Administered 2021-04-10 – 2021-04-12 (×7): 10 g
  Filled 2021-04-09 (×7): qty 1

## 2021-04-09 MED ORDER — HEPARIN SODIUM (PORCINE) 1000 UNIT/ML IJ SOLN
INTRAMUSCULAR | Status: AC
  Filled 2021-04-09: qty 4

## 2021-04-09 MED ORDER — FENTANYL CITRATE PF 50 MCG/ML IJ SOSY
50.0000 ug | PREFILLED_SYRINGE | Freq: Once | INTRAMUSCULAR | Status: AC
  Administered 2021-04-09: 50 ug via INTRAVENOUS
  Filled 2021-04-09: qty 1

## 2021-04-09 MED ORDER — CALCIUM CHLORIDE 10 % IV SOLN
INTRAVENOUS | Status: AC
  Filled 2021-04-09: qty 20

## 2021-04-09 MED ORDER — DOCUSATE SODIUM 100 MG PO CAPS: 100.0000 mg | ORAL_CAPSULE | Freq: Two times a day (BID) | ORAL | Status: DC | PRN

## 2021-04-09 MED ORDER — MIDAZOLAM HCL 50 MG/10ML IJ SOLN
0.0000 mg/h | INTRAMUSCULAR | Status: DC
  Administered 2021-04-09 – 2021-04-11 (×7): 60 mg/h via INTRAVENOUS
  Administered 2021-04-11: 23:00:00 10 mg/h via INTRAVENOUS
  Administered 2021-04-11 (×2): 60 mg/h via INTRAVENOUS
  Filled 2021-04-09 (×13): qty 50

## 2021-04-09 MED ORDER — ALBUTEROL SULFATE (2.5 MG/3ML) 0.083% IN NEBU
10.0000 mg | INHALATION_SOLUTION | Freq: Once | RESPIRATORY_TRACT | Status: AC
  Administered 2021-04-09: 10 mg via RESPIRATORY_TRACT
  Filled 2021-04-09: qty 12

## 2021-04-09 MED ORDER — MIDAZOLAM-SODIUM CHLORIDE 100-0.9 MG/100ML-% IV SOLN
10.0000 mg/h | INTRAVENOUS | Status: DC
Start: 2021-04-09 — End: 2021-04-09

## 2021-04-09 MED ORDER — ALBUTEROL SULFATE (2.5 MG/3ML) 0.083% IN NEBU: 2.5000 mg | INHALATION_SOLUTION | RESPIRATORY_TRACT | Status: DC | PRN

## 2021-04-09 MED ORDER — MIDAZOLAM-SODIUM CHLORIDE 100-0.9 MG/100ML-% IV SOLN
60.0000 mg/h | INTRAVENOUS | Status: AC
  Administered 2021-04-09: 5 mg/h via INTRAVENOUS
  Administered 2021-04-09: 40 mg/h via INTRAVENOUS
  Filled 2021-04-09: qty 100
  Filled 2021-04-09: qty 200
  Filled 2021-04-09: qty 100

## 2021-04-09 MED ORDER — POLYETHYLENE GLYCOL 3350 17 G PO PACK
17.0000 g | PACK | Freq: Every day | ORAL | Status: DC
  Administered 2021-04-10 – 2021-04-12 (×3): 17 g
  Filled 2021-04-09 (×4): qty 1

## 2021-04-09 MED ORDER — PROPOFOL 1000 MG/100ML IV EMUL
INTRAVENOUS | Status: DC | PRN
  Administered 2021-04-09: 20 ug/kg/min via INTRAVENOUS

## 2021-04-09 MED ORDER — BUDESONIDE 0.5 MG/2ML IN SUSP
0.5000 mg | Freq: Two times a day (BID) | RESPIRATORY_TRACT | Status: DC
  Administered 2021-04-09 – 2021-04-13 (×9): 0.5 mg via RESPIRATORY_TRACT
  Filled 2021-04-09 (×9): qty 2

## 2021-04-09 MED ORDER — LEVETIRACETAM IN NACL 1000 MG/100ML IV SOLN
1000.0000 mg | Freq: Once | INTRAVENOUS | Status: AC
  Administered 2021-04-09: 1000 mg via INTRAVENOUS
  Filled 2021-04-09: qty 100

## 2021-04-09 MED ORDER — PROSOURCE TF PO LIQD
45.0000 mL | Freq: Three times a day (TID) | ORAL | Status: DC
  Administered 2021-04-09 – 2021-04-13 (×12): 45 mL
  Filled 2021-04-09 (×12): qty 45

## 2021-04-09 MED ORDER — LABETALOL HCL 5 MG/ML IV SOLN
10.0000 mg | INTRAVENOUS | Status: DC
  Administered 2021-04-09: 10 mg via INTRAVENOUS
  Filled 2021-04-09 (×3): qty 4

## 2021-04-09 MED ORDER — CHLORHEXIDINE GLUCONATE 0.12% ORAL RINSE (MEDLINE KIT)
15.0000 mL | Freq: Two times a day (BID) | OROMUCOSAL | Status: DC
  Administered 2021-04-09 – 2021-04-13 (×9): 15 mL via OROMUCOSAL

## 2021-04-09 MED ORDER — CHLORHEXIDINE GLUCONATE CLOTH 2 % EX PADS
6.0000 | MEDICATED_PAD | Freq: Every day | CUTANEOUS | Status: DC
  Administered 2021-04-09 – 2021-04-11 (×3): 6 via TOPICAL

## 2021-04-09 MED ORDER — DOCUSATE SODIUM 50 MG/5ML PO LIQD
100.0000 mg | Freq: Two times a day (BID) | ORAL | Status: DC
  Administered 2021-04-10 – 2021-04-12 (×4): 100 mg
  Filled 2021-04-09 (×6): qty 10

## 2021-04-09 MED ORDER — NOREPINEPHRINE 4 MG/250ML-% IV SOLN
INTRAVENOUS | Status: AC
  Filled 2021-04-09: qty 250

## 2021-04-09 MED ORDER — SODIUM ZIRCONIUM CYCLOSILICATE 10 G PO PACK
10.0000 g | PACK | Freq: Once | ORAL | Status: DC
  Filled 2021-04-09: qty 1

## 2021-04-09 MED ORDER — MIDAZOLAM BOLUS VIA INFUSION
10.0000 mg | Freq: Once | INTRAVENOUS | Status: AC
  Administered 2021-04-09: 10 mg via INTRAVENOUS
  Filled 2021-04-09: qty 10

## 2021-04-09 MED ORDER — SODIUM BICARBONATE 8.4 % IV SOLN
Freq: Once | INTRAVENOUS | Status: AC
  Filled 2021-04-09: qty 1000

## 2021-04-09 MED ORDER — THIAMINE HCL 100 MG/ML IJ SOLN
100.0000 mg | Freq: Every day | INTRAMUSCULAR | Status: DC
  Administered 2021-04-09 – 2021-04-13 (×5): 100 mg via INTRAVENOUS
  Filled 2021-04-09 (×5): qty 2

## 2021-04-09 MED ORDER — DEXTROSE 10 % IV SOLN: Freq: Once | INTRAVENOUS | Status: AC

## 2021-04-09 MED ORDER — FENTANYL BOLUS VIA INFUSION
50.0000 ug | INTRAVENOUS | Status: DC | PRN
  Administered 2021-04-09: 50 ug via INTRAVENOUS
  Administered 2021-04-09: 75 ug via INTRAVENOUS
  Filled 2021-04-09: qty 100

## 2021-04-09 MED ORDER — NOREPINEPHRINE 4 MG/250ML-% IV SOLN
2.0000 ug/min | INTRAVENOUS | Status: DC
  Administered 2021-04-09: 2 ug/min via INTRAVENOUS
  Administered 2021-04-10: 4 ug/min via INTRAVENOUS
  Filled 2021-04-09: qty 250

## 2021-04-09 MED ORDER — MIDAZOLAM HCL 2 MG/2ML IJ SOLN
10.0000 mg | Freq: Once | INTRAMUSCULAR | Status: DC
  Filled 2021-04-09: qty 10

## 2021-04-09 MED ORDER — SUCCINYLCHOLINE CHLORIDE 20 MG/ML IJ SOLN
INTRAMUSCULAR | Status: DC | PRN
  Administered 2021-04-09: 75 mg via INTRAVENOUS

## 2021-04-09 MED ORDER — NEPRO/CARBSTEADY PO LIQD
1000.0000 mL | ORAL | Status: DC
Start: 2021-04-09 — End: 2021-04-09

## 2021-04-09 MED ORDER — ROCURONIUM BROMIDE 50 MG/5ML IV SOLN
INTRAVENOUS | Status: DC | PRN
  Administered 2021-04-09: 75 mg via INTRAVENOUS

## 2021-04-09 MED ORDER — MIDAZOLAM HCL 2 MG/2ML IJ SOLN
INTRAMUSCULAR | Status: AC
  Administered 2021-04-09: 4 mg via INTRAVENOUS
  Filled 2021-04-09: qty 4

## 2021-04-09 MED ORDER — ACETAMINOPHEN 325 MG PO TABS
650.0000 mg | ORAL_TABLET | ORAL | Status: DC | PRN
  Filled 2021-04-09: qty 2

## 2021-04-09 MED ORDER — NEPRO/CARBSTEADY PO LIQD
1000.0000 mL | ORAL | Status: DC
  Administered 2021-04-09 – 2021-04-12 (×4): 1000 mL
  Filled 2021-04-09 (×7): qty 1000

## 2021-04-09 MED ORDER — DEXTROSE 50 % IV SOLN
1.0000 | Freq: Once | INTRAVENOUS | Status: AC
  Administered 2021-04-09: 50 mL via INTRAVENOUS
  Filled 2021-04-09: qty 50

## 2021-04-09 MED ORDER — RENA-VITE PO TABS
1.0000 | ORAL_TABLET | Freq: Every day | ORAL | Status: DC
  Administered 2021-04-09 – 2021-04-12 (×4): 1
  Filled 2021-04-09 (×4): qty 1

## 2021-04-09 MED ORDER — FENTANYL 2500MCG IN NS 250ML (10MCG/ML) PREMIX INFUSION
50.0000 ug/h | INTRAVENOUS | Status: DC
  Administered 2021-04-09: 50 ug/h via INTRAVENOUS
  Filled 2021-04-09 (×2): qty 250

## 2021-04-09 MED ORDER — ETOMIDATE 2 MG/ML IV SOLN
INTRAVENOUS | Status: DC | PRN
  Administered 2021-04-09: 20 mg via INTRAVENOUS

## 2021-04-09 MED ORDER — ORAL CARE MOUTH RINSE
15.0000 mL | OROMUCOSAL | Status: DC
  Administered 2021-04-09 – 2021-04-13 (×42): 15 mL via OROMUCOSAL

## 2021-04-09 MED ORDER — POLYETHYLENE GLYCOL 3350 17 G PO PACK: 17.0000 g | PACK | Freq: Every day | ORAL | Status: DC

## 2021-04-09 MED ORDER — CALCIUM CHLORIDE 10 % IV SOLN
INTRAVENOUS | Status: DC | PRN
  Administered 2021-04-09: 1 g via INTRAVENOUS

## 2021-04-09 MED ORDER — PROPOFOL 1000 MG/100ML IV EMUL
INTRAVENOUS | Status: AC
  Filled 2021-04-09: qty 100

## 2021-04-09 MED ORDER — INSULIN ASPART 100 UNIT/ML IV SOLN
10.0000 [IU] | Freq: Once | INTRAVENOUS | Status: AC
  Administered 2021-04-09: 10 [IU] via INTRAVENOUS

## 2021-04-09 MED ORDER — LEVETIRACETAM IN NACL 500 MG/100ML IV SOLN
500.0000 mg | Freq: Two times a day (BID) | INTRAVENOUS | Status: DC
  Administered 2021-04-09 – 2021-04-13 (×8): 500 mg via INTRAVENOUS
  Filled 2021-04-09 (×8): qty 100

## 2021-04-09 MED ORDER — POLYETHYLENE GLYCOL 3350 17 G PO PACK: 17.0000 g | PACK | Freq: Every day | ORAL | Status: DC | PRN

## 2021-04-09 MED ORDER — ASPIRIN 300 MG RE SUPP: 300.0000 mg | RECTAL | Status: AC

## 2021-04-09 MED ORDER — MIDAZOLAM HCL 2 MG/2ML IJ SOLN: 4.0000 mg | Freq: Once | INTRAMUSCULAR | Status: AC

## 2021-04-09 MED ORDER — EPINEPHRINE 1 MG/10ML IJ SOSY
PREFILLED_SYRINGE | INTRAMUSCULAR | Status: AC
  Filled 2021-04-09: qty 10

## 2021-04-09 MED ORDER — IPRATROPIUM-ALBUTEROL 0.5-2.5 (3) MG/3ML IN SOLN
3.0000 mL | Freq: Four times a day (QID) | RESPIRATORY_TRACT | Status: DC
  Administered 2021-04-09 – 2021-04-13 (×17): 3 mL via RESPIRATORY_TRACT
  Filled 2021-04-09 (×17): qty 3

## 2021-04-09 MED ORDER — PANTOPRAZOLE SODIUM 40 MG IV SOLR
40.0000 mg | Freq: Every day | INTRAVENOUS | Status: DC
  Administered 2021-04-09 – 2021-04-12 (×4): 40 mg via INTRAVENOUS
  Filled 2021-04-09 (×4): qty 40

## 2021-04-09 MED ORDER — SODIUM BICARBONATE 8.4 % IV SOLN
INTRAVENOUS | Status: AC
  Filled 2021-04-09: qty 100

## 2021-04-09 MED ORDER — PROPOFOL 1000 MG/100ML IV EMUL
5.0000 ug/kg/min | INTRAVENOUS | Status: DC
  Administered 2021-04-09 (×2): 80 ug/kg/min via INTRAVENOUS
  Administered 2021-04-09: 40 ug/kg/min via INTRAVENOUS
  Administered 2021-04-09: 45 ug/kg/min via INTRAVENOUS
  Administered 2021-04-09: 80 ug/kg/min via INTRAVENOUS
  Administered 2021-04-09 (×2): 40 ug/kg/min via INTRAVENOUS
  Administered 2021-04-10 (×7): 80 ug/kg/min via INTRAVENOUS
  Administered 2021-04-11: 11:00:00 60 ug/kg/min via INTRAVENOUS
  Administered 2021-04-11 (×2): 80 ug/kg/min via INTRAVENOUS
  Administered 2021-04-11: 20:00:00 40 ug/kg/min via INTRAVENOUS
  Administered 2021-04-11 (×2): 80 ug/kg/min via INTRAVENOUS
  Administered 2021-04-11: 15:00:00 40 ug/kg/min via INTRAVENOUS
  Administered 2021-04-12 (×2): 60 ug/kg/min via INTRAVENOUS
  Administered 2021-04-12: 02:00:00 80 ug/kg/min via INTRAVENOUS
  Filled 2021-04-09: qty 200
  Filled 2021-04-09: qty 400
  Filled 2021-04-09: qty 100
  Filled 2021-04-09: qty 500
  Filled 2021-04-09 (×2): qty 100
  Filled 2021-04-09: qty 200
  Filled 2021-04-09: qty 500
  Filled 2021-04-09: qty 200
  Filled 2021-04-09: qty 100

## 2021-04-09 MED ORDER — VITAL 1.5 CAL PO LIQD: 1000.0000 mL | ORAL | Status: DC

## 2021-04-09 MED ORDER — ONDANSETRON HCL 4 MG/2ML IJ SOLN: 4.0000 mg | Freq: Four times a day (QID) | INTRAMUSCULAR | Status: DC | PRN

## 2021-04-09 MED ORDER — DOCUSATE SODIUM 50 MG/5ML PO LIQD: 100.0000 mg | Freq: Two times a day (BID) | ORAL | Status: DC

## 2021-04-09 MED ORDER — ATROPINE SULFATE 1 MG/10ML IJ SOSY
PREFILLED_SYRINGE | INTRAMUSCULAR | Status: AC
  Filled 2021-04-09: qty 10

## 2021-04-09 MED ORDER — HEPARIN (PORCINE) 25000 UT/250ML-% IV SOLN
2300.0000 [IU]/h | INTRAVENOUS | Status: DC
  Administered 2021-04-09: 1250 [IU]/h via INTRAVENOUS
  Administered 2021-04-10: 1500 [IU]/h via INTRAVENOUS
  Administered 2021-04-11: 05:00:00 2300 [IU]/h via INTRAVENOUS
  Filled 2021-04-09 (×4): qty 250

## 2021-04-09 MED ORDER — SODIUM CHLORIDE 0.9 % IV SOLN: 250.0000 mL | INTRAVENOUS | Status: DC

## 2021-04-09 MED ORDER — MIDAZOLAM HCL 2 MG/2ML IJ SOLN
4.0000 mg | Freq: Once | INTRAMUSCULAR | Status: AC
  Administered 2021-04-09: 4 mg via INTRAVENOUS
  Filled 2021-04-09: qty 4

## 2021-04-09 MED ORDER — ASPIRIN 81 MG PO CHEW: 324.0000 mg | CHEWABLE_TABLET | ORAL | Status: AC

## 2021-04-09 NOTE — Progress Notes (Signed)
Pt transported from [facility] to CT and back w/o complications. RT will cont to monitor.

## 2021-04-09 NOTE — Code Documentation (Signed)
Pt intubated with [place] RRT and [facility] EDP

## 2021-04-09 NOTE — Progress Notes (Signed)
eLink Physician-Brief Progress Note Patient Name: JOBY RICHART DOB: 10/13/1965 MRN: [id number]   Date of Service  04/06/2021  HPI/Events of Note  55 year old man admitted to ICU following cardiac arrest. Is on PRVC VT 630, RR 30, peep 5, fio2 40. HR in 80s. O2 sat 100. BP 135/84. Intubated and sedated. Seen by PCCM and planned for HD today. Has history of CHF, ESRD on HD, A fib on eliquis among other problems.   eICU Interventions  Continue sedation on vent, renal plans for HD, heparin drip and plan per bedside CCM. Call E link if needed. The vent is currently set at 8 cc/kg IBW, follow ABG to adjust vent.      Intervention Category Major Interventions: Respiratory failure - evaluation and management Evaluation Type: New Patient Evaluation  Margaretmary Lombard 03/23/2021, 6:22 AM

## 2021-04-09 NOTE — Progress Notes (Signed)
Patient transported to MRI and back.

## 2021-04-09 NOTE — Progress Notes (Addendum)
Initial Nutrition Assessment  DOCUMENTATION CODES:   Not applicable  INTERVENTION:   Initiate tube feeding via OG tube: Vital 1.5 at 25 ml/h, increase by 10 ml every 4 hours to goal rate of 55 ml/h (1320 ml per day) Prosource TF 45 ml TID  Provides 2100 kcal (2541 kcal total with propofol), 122 gm protein, 1008 ml free water daily  Renal MVI daily via tube.  NUTRITION DIAGNOSIS:   Inadequate oral intake related to inability to eat as evidenced by NPO status.  GOAL:   Patient will meet greater than or equal to 90% of their needs  MONITOR:   Vent status, TF tolerance, Labs  REASON FOR ASSESSMENT:   Consult Enteral/tube feeding initiation and management  ASSESSMENT:   55 yo male admitted S/P witnessed cardiac arrest, respiratory failure. PMH includes ESRD on HD, COPD, HTN, crack/cocaine use, depression, seizures, CHF.  Patient not in room during RD visit. Unable to obtain NFPE at this time. Patient is at increased nutrition risk, given hx of ESRD on HD and BMI=20.   Patient is currently intubated on ventilator support MV: 17.5 L/min Temp (24hrs), Avg:96.2 F (35.7 C), Min:93.8 F (34.3 C), Max:97.7 F (36.5 C)  Propofol: 16.7 ml/hr providing 441 kcal from lipid  Labs reviewed. K 6.3, Na 131, mag 2.7 CBG: 136  Medications reviewed and include Colace, folic acid, Protonix, Miralax, thiamine, propofol, Keppra.  Weight history reviewed. No significant weight changes noted.   NUTRITION - FOCUSED PHYSICAL EXAM:  Unable to complete  Diet Order:   Diet Order     None       EDUCATION NEEDS:   Not appropriate for education at this time  Skin:  Skin Assessment: Reviewed RN Assessment  Last BM:  11/25 type 7  Height:   Ht Readings from Last 1 Encounters:  03/25/2021 6\' 1"  (1.854 m)    Weight:   Wt Readings from Last 1 Encounters:  04/14/2021 69.6 kg    BMI:  Body mass index is 20.24 kg/m.  Estimated Nutritional Needs:   Kcal:   2100-2400  Protein:  105-125 gm  Fluid:  1 L + UOP    Lucas Mallow, RD, LDN, CNSC Please refer to Amion for contact information.

## 2021-04-09 NOTE — ED Triage Notes (Signed)
Pt in from home d/t witnessed arrest from home. Per medic patient was down for about 10 minutes. PTA pt I/O established to left tibia and EJ to right side of neck. Linton Rump on patient, but not running as patient arrived with ROSC. PTA patient was given 2 rounds of epi, 1 dose of 50 mEq of bicarbonate and 1g of Calcium Gluconate. Pt is unresponsive with King airway in place. Per medic, ROSC achieved at [place].

## 2021-04-09 NOTE — Code Documentation (Signed)
RSI per EDP [facility].

## 2021-04-09 NOTE — Code Documentation (Signed)
Pt transported to CT with primary RN and RT

## 2021-04-09 NOTE — Progress Notes (Signed)
ANTICOAGULATION CONSULT NOTE - Initial Consult  Pharmacy Consult for heparin Indication:  Afib and r/o ACS in setting of cardiac arrest  No Known Allergies  Patient Measurements: Height: 6\' 1"  (185.4 cm) Weight: 69.6 kg (153 lb 7 oz) IBW/kg (Calculated) : 79.9  Vital Signs: Temp: 93.8 F (34.3 C) (11/25 0228) Temp Source: Rectal (11/25 0228) BP: 166/90 (11/25 0430) Pulse Rate: 86 (11/25 0430)  Labs: Recent Labs    03/21/2021 0122 04/07/2021 0134 04/08/2021 0149 04/03/2021 0221 04/11/2021 0304 04/03/2021 0312 03/19/2021 0356  HGB 12.5* 15.0   < > 14.3 13.9  --  13.3  HCT 43.0 44.0   < > 42.0 41.0  --  39.0  PLT 217  --   --   --   --   --   --   CREATININE  --  14.80*  --   --   --  14.79*  --    < > = values in this interval not displayed.    Estimated Creatinine Clearance: 5.6 mL/min (A) (by C-G formula based on SCr of 14.79 mg/dL (H)).   Medical History: Past Medical History:  Diagnosis Date   Anemia    Anxiety    Arthritis    "qwhere" (05/15/2013)   CHF (congestive heart failure) (HCC)    COPD (chronic obstructive pulmonary disease) (HCC)    Crack cocaine use    Dental caries    Depression    ESRD (end stage renal disease) on dialysis ([place])    TTS: [address] [place] (05/15/2013)   Headache(784.0)    "get it from going to dialysis; when they get real bad I come to hospital" (05/15/2013)   Hematemesis/vomiting blood    History of blood transfusion 10/2011; 04/2013   HTN (hypertension) 06/12/2012   Seizures ([place])    Shortness of breath    "just when I have too much potassium is too high" (05/15/2013)    Assessment: 55yo male presents after witnessed cardiac arrest with ROSC in the field >> concern for ACS; pt is on apixaban PTA for Afib thought pt has been known to be noncompliant with medications, unclear when pt last took [facility].  Goal of Therapy:  Heparin level 0.3-0.7 units/ml aPTT 66-102 seconds Monitor platelets by anticoagulation protocol: Yes   Plan:   Begin heparin infusion at 1250 units/hr (based on recent heparin requirements on prior admission) and monitor heparin levels, aPTT (if Eliquis is affecting anti-Xa), and CBC.  Wynona Neat, PharmD, BCPS  03/24/2021,5:13 AM

## 2021-04-09 NOTE — Consult Note (Signed)
Reason for Consult: ESRD Referring Physician:  Dr. Dayna Barker  Chief Complaint: Cardiac arrest  Dialysis Orders:  [facility] MWF 4h 450/1.5x EDW 68 kg 1K/2.5Ca  Hectorol 2 TIW Parsbiv 15mg  TIW, K[place] Mircera 225 mcg IV q 2 wks (last 11/2)  Assessment/Plan: ESRD -  HD MWF. Continue on schedule. Next HD today; will need a COVID test resulted and hepatitis B panel sent. Please let us know when he is in the ICU in order to initiate RRT; orders have already been entered. Good bruit in the left CImino. AFib w RVR - On Eliquis, Coreg. S/p cardioversion 9/16.   Hypertension/volume  -  BP not to goal. No gross volume on exam. Close to  EDW after last HD treatment on 11/22.   Anemia  - Hgb stable. Last Mircera dose on 11/2; should be able to hold for now as last Hb as outpatient was >12.  Metabolic bone disease -  Continue home binders/Vit D once he's tolerating PO's. CHF with preserved EF - EF of 30-35% 03/2020 resolved and was thought to be secondary to tachycardia at that time. HTN COPD Polysubstance abuse  HPI: Jeremy Mccoy is an 55 y.o. male paroxysmal atrial fibrillation/flutter, tachycardia mediated cardiomyopathy, HTN, COPD, ESRD MWF at [facility] s/p recent admission in 01/29/2021 and was converted on 01/29/21 to NSR at that time. There was discussion of not having initiated on amiodarone when he was supposed to and also with Eliquis noncompliance.  Patient had a witnessed arrest at home, went through 2 rounds of Epinephrine and a round of Calcium gluconate in the field with ROSC; he required another round of Calcium gluconate upon arrival at the  ED + intubation. He was found to have hyperkalemia with a K of 7.5 initially on blood gas and later after all therapies BMP showed a K of 6.4 with a BUN/Cr of >130/14.8. He was at treatment on 11/22 but only stayed on for 2hr 79min with last 3 treatments all less than 3 hrs. He did leave dialysis on 11/22 at 68.7kg.   ROS Pertinent items are noted in  HPI.  Chemistry and CBC: Creatinine, Ser  Date/Time Value Ref Range Status  04/11/2021 01:34 AM 14.80 (H) 0.61 - 1.24 mg/dL Final  01/30/2021 02:53 AM 14.33 (H) 0.61 - 1.24 mg/dL Final  01/29/2021 11:56 AM 13.57 (H) 0.61 - 1.24 mg/dL Final  12/19/2020 04:59 AM 10.58 (H) 0.61 - 1.24 mg/dL Final  12/18/2020 02:41 PM 8.66 (H) 0.61 - 1.24 mg/dL Final  04/01/2020 03:43 AM 7.10 (H) 0.61 - 1.24 mg/dL Final  03/31/2020 11:15 AM 5.57 (H) 0.61 - 1.24 mg/dL Final    Comment:    DELTA CHECK NOTED DIALYSIS   03/31/2020 03:20 AM 10.75 (H) 0.61 - 1.24 mg/dL Final  03/30/2020 12:06 PM 10.40 (H) 0.61 - 1.24 mg/dL Final  03/30/2020 04:15 AM 9.34 (H) 0.61 - 1.24 mg/dL Final  03/29/2020 06:06 PM 8.39 (H) 0.61 - 1.24 mg/dL Final  03/29/2020 02:28 AM 7.07 (H) 0.61 - 1.24 mg/dL Final  03/28/2020 06:01 AM 5.72 (H) 0.61 - 1.24 mg/dL Final  03/28/2020 01:55 AM 5.29 (H) 0.61 - 1.24 mg/dL Final  03/28/2020 01:55 AM 5.41 (H) 0.61 - 1.24 mg/dL Final  03/27/2020 02:55 AM 6.91 (H) 0.61 - 1.24 mg/dL Final    Comment:    DELTA CHECK NOTED DIALYSIS   03/26/2020 08:20 AM 10.58 (H) 0.61 - 1.24 mg/dL Final  03/25/2020 09:57 AM 9.74 (H) 0.61 - 1.24 mg/dL Final  01/23/2020 08:58 AM 7.54 (H)  0.61 - 1.24 mg/dL Final  01/22/2020 04:28 AM 9.61 (H) 0.61 - 1.24 mg/dL Final  01/21/2020 05:50 AM 8.16 (H) 0.61 - 1.24 mg/dL Final    Comment:    DELTA CHECK NOTED DIALYSIS   01/20/2020 09:10 AM 13.95 (H) 0.61 - 1.24 mg/dL Final  01/11/2020 04:47 PM 10.37 (H) 0.61 - 1.24 mg/dL Final  07/25/2019 07:22 AM 9.39 (H) 0.61 - 1.24 mg/dL Final  10/26/2017 05:08 AM 11.66 (H) 0.61 - 1.24 mg/dL Final  10/25/2017 01:30 PM 8.89 (H) 0.61 - 1.24 mg/dL Final  10/25/2017 06:27 AM 12.88 (H) 0.61 - 1.24 mg/dL Final  10/25/2017 05:58 AM 12.83 (H) 0.61 - 1.24 mg/dL Final  10/24/2017 07:53 AM 10.57 (H) 0.61 - 1.24 mg/dL Final  10/23/2017 04:23 PM 9.55 (H) 0.61 - 1.24 mg/dL Final    Comment:    REPEATED TO VERIFY DELTA CHECK NOTED    10/23/2017 10:27 AM 14.99 (H) 0.61 - 1.24 mg/dL Final  10/20/2017 02:10 PM 15.54 (H) 0.61 - 1.24 mg/dL Final  09/12/2017 11:02 AM 10.90 (HH) 0.76 - 1.27 mg/dL Final    Comment:                      Client Requested Flag **Verified by repeat analysis**   08/05/2016 07:17 AM 7.46 (H) 0.61 - 1.24 mg/dL Final  08/04/2016 10:02 PM 6.19 (H) 0.61 - 1.24 mg/dL Final  08/04/2016 08:00 AM 8.37 (H) 0.61 - 1.24 mg/dL Final    Comment:    DELTA CHECK NOTED  08/03/2016 02:07 AM 3.65 (H) 0.61 - 1.24 mg/dL Final    Comment:    DELTA CHECK NOTED  08/02/2016 11:58 PM 6.82 (H) 0.61 - 1.24 mg/dL Final    Comment:    DELTA CHECK NOTED  08/02/2016 09:48 AM 11.50 (H) 0.61 - 1.24 mg/dL Final  02/26/2016 08:43 AM 12.61 (H) 0.61 - 1.24 mg/dL Final  02/25/2016 04:55 AM 10.14 (H) 0.61 - 1.24 mg/dL Final  02/24/2016 03:00 PM 13.21 (H) 0.61 - 1.24 mg/dL Final  02/24/2016 04:32 AM 12.32 (H) 0.61 - 1.24 mg/dL Final  02/23/2016 11:35 AM >18.00 (H) 0.61 - 1.24 mg/dL Final  02/23/2016 11:29 AM 19.36 (H) 0.61 - 1.24 mg/dL Final  07/26/2015 05:41 AM 10.39 (H) 0.61 - 1.24 mg/dL Final  07/25/2015 05:57 PM 8.87 (H) 0.61 - 1.24 mg/dL Final    Comment:    DELTA CHECK NOTED  07/25/2015 04:14 PM 6.40 (H) 0.61 - 1.24 mg/dL Final  07/25/2015 12:57 PM 15.10 (H) 0.61 - 1.24 mg/dL Final  07/25/2015 10:16 AM 15.20 (H) 0.61 - 1.24 mg/dL Final  02/22/2014 05:00 AM 13.56 (H) 0.50 - 1.35 mg/dL Final  02/21/2014 07:58 AM 10.42 (H) 0.50 - 1.35 mg/dL Final   Recent Labs  Lab 03/27/2021 0134 04/07/2021 0149 04/10/2021 0221  NA 133* 132*  131* 132*  K 6.4* 6.5*  6.7* 7.1*  CL 102  --   --   GLUCOSE 150*  --   --   BUN >130*  --   --   CREATININE 14.80*  --   --    Recent Labs  Lab 04/04/2021 0134 03/24/2021 0149 04/14/2021 0221  HGB 15.0 14.3  13.3 14.3  HCT 44.0 42.0  39.0 42.0   Liver Function Tests: No results for input(s): AST, ALT, ALKPHOS, BILITOT, PROT, ALBUMIN in the last 168 hours. No results for input(s): LIPASE,  AMYLASE in the last 168 hours. No results for input(s): AMMONIA in the last 168 hours. Cardiac Enzymes:  No results for input(s): CKTOTAL, CKMB, CKMBINDEX, TROPONINI in the last 168 hours. Iron Studies: No results for input(s): IRON, TIBC, TRANSFERRIN, FERRITIN in the last 72 hours. PT/INR: @LABRCNTIP (inr:5)  Xrays/Other Studies: ) Results for orders placed or performed during the hospital encounter of 04/10/2021 (from the past 48 hour(s))  I-stat chem 8, ed     Status: Abnormal   Collection Time: 03/26/2021  1:34 AM  Result Value Ref Range   Sodium 133 (L) 135 - 145 mmol/L   Potassium 6.4 (HH) 3.5 - 5.1 mmol/L   Chloride 102 98 - 111 mmol/L   BUN >130 (H) 6 - 20 mg/dL   Creatinine, Ser 14.80 (H) 0.61 - 1.24 mg/dL   Glucose, Bld 150 (H) 70 - 99 mg/dL    Comment: Glucose reference range applies only to samples taken after fasting for at least 8 hours.   Calcium, Ion 1.15 1.15 - 1.40 mmol/L   TCO2 18 (L) 22 - 32 mmol/L   Hemoglobin 15.0 13.0 - 17.0 g/dL   HCT 44.0 39.0 - 52.0 %   Comment NOTIFIED PHYSICIAN   I-Stat venous blood gas, ED     Status: Abnormal   Collection Time: 03/24/2021  1:49 AM  Result Value Ref Range   pH, Ven 6.954 (LL) 7.250 - 7.430   pCO2, Ven 62.1 (H) 44.0 - 60.0 mmHg   pO2, Ven 99.0 (H) 32.0 - 45.0 mmHg   Bicarbonate 13.8 (L) 20.0 - 28.0 mmol/L   TCO2 16 (L) 22 - 32 mmol/L   O2 Saturation 92.0 %   Acid-base deficit 19.0 (H) 0.0 - 2.0 mmol/L   Sodium 132 (L) 135 - 145 mmol/L   Potassium 6.5 (HH) 3.5 - 5.1 mmol/L   Calcium, Ion 1.15 1.15 - 1.40 mmol/L   HCT 42.0 39.0 - 52.0 %   Hemoglobin 14.3 13.0 - 17.0 g/dL   Sample type VENOUS    Comment NOTIFIED PHYSICIAN   I-Stat arterial blood gas, ED     Status: Abnormal   Collection Time: 03/28/2021  1:49 AM  Result Value Ref Range   pH, Arterial 6.999 (LL) 7.350 - 7.450   pCO2 arterial 62.8 (H) 32.0 - 48.0 mmHg   pO2, Arterial 489 (H) 83.0 - 108.0 mmHg   Bicarbonate 15.5 (L) 20.0 - 28.0 mmol/L   TCO2 17 (L) 22 -  32 mmol/L   O2 Saturation 100.0 %   Acid-base deficit 16.0 (H) 0.0 - 2.0 mmol/L   Sodium 131 (L) 135 - 145 mmol/L   Potassium 6.7 (HH) 3.5 - 5.1 mmol/L   Calcium, Ion 1.47 (H) 1.15 - 1.40 mmol/L   HCT 39.0 39.0 - 52.0 %   Hemoglobin 13.3 13.0 - 17.0 g/dL   Sample type ARTERIAL    Comment NOTIFIED PHYSICIAN   I-Stat arterial blood gas, ED     Status: Abnormal   Collection Time: 04/12/2021  2:21 AM  Result Value Ref Range   pH, Arterial 7.064 (LL) 7.350 - 7.450   pCO2 arterial 61.7 (H) 32.0 - 48.0 mmHg   pO2, Arterial 512 (H) 83.0 - 108.0 mmHg   Bicarbonate 18.1 (L) 20.0 - 28.0 mmol/L   TCO2 20 (L) 22 - 32 mmol/L   O2 Saturation 100.0 %   Acid-base deficit 13.0 (H) 0.0 - 2.0 mmol/L   Sodium 132 (L) 135 - 145 mmol/L   Potassium 7.1 (HH) 3.5 - 5.1 mmol/L   Calcium, Ion 1.32 1.15 - 1.40 mmol/L   HCT 42.0 39.0 - 52.0 %  Hemoglobin 14.3 13.0 - 17.0 g/dL   Patient temperature 95.4 F    Collection site RADIAL, ALLEN'S TEST ACCEPTABLE    Drawn by RT    Sample type ARTERIAL    Comment NOTIFIED PHYSICIAN    DG Abdomen 1 View  Result Date: 04/07/2021 CLINICAL DATA:  EVA encounter for OG tube placement. EXAM: ABDOMEN - 1 VIEW COMPARISON:  Chest x-ray 03/21/2021, x-ray abdomen 07/10/2013 FINDINGS: Enteric tube coursing below the diaphragm with tip and side port overlying the expected region the gastric lumen. The bowel gas pattern is normal within the visualized left upper abdomen. No radio-opaque calculi or other significant radiographic abnormality are seen. IMPRESSION: Enteric tube in good position. Electronically Signed   By: Iven Finn M.D.   On: 04/03/2021 02:30   CT HEAD WO CONTRAST  Result Date: 03/30/2021 CLINICAL DATA:  Status post cardiac arrest EXAM: CT HEAD WITHOUT CONTRAST TECHNIQUE: Contiguous axial images were obtained from the base of the skull through the vertex without intravenous contrast. COMPARISON:  07/11/2013. FINDINGS: Brain: No evidence of acute infarction,  hemorrhage, cerebral edema, mass, mass effect, or midline shift. Preservation of the gray-white differentiation. No hydrocephalus or extra-axial fluid collection. Vascular: No hyperdense vessel. Atherosclerotic calcifications in the intracranial carotid and vertebral arteries. Skull: Normal. Negative for fracture or focal lesion. Sinuses/Orbits: Mucous retention cyst in the right maxillary sinus. Otherwise negative. Other: The mastoid air cells are well aerated. IMPRESSION: No acute intracranial process. Please note that MRI is more sensitive for early changes related to hypoxic brain injury. Electronically Signed   By: Merilyn Baba M.D.   On: 03/26/2021 02:17   DG Chest Port 1 View  Addendum Date: 03/23/2021   ADDENDUM REPORT: 04/03/2021 02:30 ADDENDUM: Prominent cardiac silhouette unchanged. Some portion due to AP portable technique. Electronically Signed   By: Iven Finn M.D.   On: 04/14/2021 02:30   Result Date: 04/03/2021 CLINICAL DATA:  CVA.  Status post CPR EXAM: PORTABLE CHEST 1 VIEW COMPARISON:  Chest x-ray 01/29/2021, CT chest 07/25/2015 FINDINGS: Endotracheal tube with tip terminating 8 cm above the carina. Enteric tube coursing below the hemidiaphragm with tip and side port collimated off view. Lines and tubes overlie the patient. The heart and mediastinal contours are within normal limits. Right lower lobe hazy airspace opacity. Right hilar prominence. Left hilar prominence. Persistent coarsened interstitial markings. Interval development of hazy airspace opacity within the left upper lobe. Similar-appearing scattered calcified granulomas. No pleural effusion. No pneumothorax. No acute osseous abnormality. IMPRESSION: 1. Endotracheal tube with tip terminating 8 cm above the carina. Consider advancing by couple cm. 2. Enteric tube courses below hemidiaphragm. Please see separately dictated x-ray abdomen 04/06/2021. 3. Similar lung findings with interval development of a left upper lobe  airspace opacity. 4. Persistent right lower lobe consolidation/airspace opacity. 5. Sequelae of prior granulomatous disease. Electronically Signed: By: Iven Finn M.D. On: 04/11/2021 02:28    PMH:   Past Medical History:  Diagnosis Date   Anemia    Anxiety    Arthritis    "qwhere" (05/15/2013)   CHF (congestive heart failure) (HCC)    COPD (chronic obstructive pulmonary disease) (HCC)    Crack cocaine use    Dental caries    Depression    ESRD (end stage renal disease) on dialysis ([place])    TTS: [address] [place] (05/15/2013)   Headache(784.0)    "get it from going to dialysis; when they get real bad I come to hospital" (05/15/2013)   Hematemesis/vomiting blood  History of blood transfusion 10/2011; 04/2013   HTN (hypertension) 06/12/2012   Seizures ([place])    Shortness of breath    "just when I have too much potassium is too high" (05/15/2013)    PSH:   Past Surgical History:  Procedure Laterality Date   AV FISTULA PLACEMENT  11/29/2011   Procedure: ARTERIOVENOUS (AV) FISTULA CREATION;  Surgeon: Angelia Mould, MD;  Location: [place];  Service: Vascular;  Laterality: Left;   CARDIOVERSION N/A 03/31/2020   Procedure: CARDIOVERSION;  Surgeon: Lelon Perla, MD;  Location[facility] ENDOSCOPY;  Service: Cardiovascular;  Laterality: N/A;   ESOPHAGOGASTRODUODENOSCOPY N/A 05/17/2013   Procedure: ESOPHAGOGASTRODUODENOSCOPY (EGD);  Surgeon: Beryle Beams, MD;  Location: [facility] ENDOSCOPY;  Service: Endoscopy;  Laterality: N/A;   INGUINAL HERNIA REPAIR Bilateral 1967   IR FLUORO GUIDE CV LINE RIGHT  10/20/2017   IR THROMBECTOMY AV FISTULA W/THROMBOLYSIS/PTA INC/SHUNT/IMG LEFT Left 10/24/2017   IR US GUIDE VASC ACCESS LEFT  10/24/2017   IR US GUIDE VASC ACCESS RIGHT  10/20/2017   RENAL BIOPSY  11/07/2011   TEE WITHOUT CARDIOVERSION N/A 03/31/2020   Procedure: TRANSESOPHAGEAL ECHOCARDIOGRAM (TEE);  Surgeon: Lelon Perla, MD;  Location: [facility] ENDOSCOPY;  Service: Cardiovascular;   Laterality: N/A;    Allergies: No Known Allergies  Medications:   Prior to Admission medications   Medication Sig Start Date End Date Taking? Authorizing Provider  acetaminophen (TYLENOL) 500 MG tablet Take 1 tablet (500 mg total) by mouth every 8 (eight) hours as needed for mild pain or fever. 6 times a day, 500 mg, 12 to 24 tabs a day, 6,000 to 12,000 mg daily for pain 08/05/16   Molt, Bethany, DO  albuterol (VENTOLIN HFA) 108 (90 Base) MCG/ACT inhaler Inhale 3-6 puffs into the lungs every 6 (six) hours as needed for wheezing or shortness of breath.     [provider]  apixaban (ELIQUIS) 5 MG TABS tablet Take 1 tablet (5 mg total) by mouth 2 (two) times daily. 01/28/21 03/11/21  Deberah Pelton, NP  clotrimazole (LOTRIMIN) 1 % cream Apply topically 2 (two) times daily as needed (itching). Back and chest 01/30/21   Alma Friendly, MD  diphenhydrAMINE (BENADRYL) 25 mg capsule Take 25 mg by mouth every 6 (six) hours as needed for itching.    [provider]  doxercalciferol (HECTOROL) 0.5 MCG capsule Doxercalciferol (Hectorol) 08/05/20 08/04/21  [provider]  hydrALAZINE (APRESOLINE) 25 MG tablet Take 1 tablet (25 mg total) by mouth 3 (three) times daily. 05/26/20   Richardson Dopp T, PA-C  hydrocortisone cream 1 % Apply 1 application topically 2 (two) times daily as needed for itching.    [provider]  hydrOXYzine (ATARAX/VISTARIL) 25 MG tablet Take 50 mg by mouth 3 (three) times daily as needed for itching.    [provider]  Methoxy PEG-Epoetin Beta (MIRCERA IJ) Mircera 06/15/20 06/14/21  [provider]  metoprolol succinate (TOPROL XL) 50 MG 24 hr tablet Take 1 tablet (50 mg total) by mouth daily. Take with or immediately following a meal. 02/04/21 02/04/22  Fenton, Clint R, PA  pantoprazole (PROTONIX) 40 MG tablet TAKE 1 TABLET (40 MG TOTAL) BY MOUTH DAILY. 04/01/20 04/01/21  Ezequiel [place], MD  sevelamer carbonate (RENVELA) 800 MG  tablet Take 2 tablets (1,600 mg total) by mouth 3 (three) times daily with meals. 01/25/14   McLean-Scocuzza, Nino Glow, MD  sodium zirconium cyclosilicate (LOKELMA) 10 g PACK packet Take 10 g by mouth daily as needed (high  potassium).    [provider]  SPIRIVA RESPIMAT 2.5 MCG/ACT AERS Take 2 puffs by mouth daily. 01/28/21   [provider]  tolnaftate (TINACTIN) 1 % spray Apply 1 application topically 2 (two) times daily as needed (itching on arms/back).    [provider]    Discontinued Meds:  There are no discontinued medications.  Social History:  reports that he has been smoking cigars and cigarettes. He has a 50.00 pack-year smoking history. He has never used smokeless tobacco. He reports current drug use. Drugs: Marijuana and Cocaine. He reports that he does not drink alcohol.  Family History:   Family History  Problem Relation Age of Onset   Heart attack Father     Blood pressure (!) 182/125, pulse 85, temperature (!) 93.8 F (34.3 C), temperature source Rectal, resp. rate (!) 24, height 6\' 1"  (1.854 m), SpO2 100 %.  General: Lying in bed, nad, NCAT Heart: RRR No rub or gallop Lungs: Intubated Abdomen: soft non-tender  Extremities: No LE edema  Dialysis Access: LUE AVF +bruit        Dwana Melena, MD 04/10/2021, 2:59 AM

## 2021-04-09 NOTE — Progress Notes (Signed)
EEG done at bedside following MRI. No skin breakdown noted. Results pending.

## 2021-04-09 NOTE — Progress Notes (Signed)
LTM 24 hr setup at bedside. No skin break down noted. Results pending.

## 2021-04-09 NOTE — Consult Note (Signed)
Neurology Consultation Reason for Consult: cardiac arrest, myoclonic seizure Referring Physician: Dr Freda Jackson  CC: cardiac arrest  History is obtained from: Chart review as patient is intubated, sedated  HPI: Jeremy Mccoy is a 55 y.o. male with past medical history of end-stage renal disease on hemodialysis MWF, congestive heart failure, COPD, cocaine use, depression, hypertension, seizures who had a cardiac arrest at home.  EMS arrived and ROSC was achieved after about 10 minutes.  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  He was noted to have jerking movements concerning for seizures.  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  Therefore neurology was consulted for further management.  ROS: Unable to obtain due to altered mental status.   Past Medical History:  Diagnosis Date   Anemia    Anxiety    Arthritis    "qwhere" (05/15/2013)   CHF (congestive heart failure) (HCC)    COPD (chronic obstructive pulmonary disease) (HCC)    Crack cocaine use    Dental caries    Depression    ESRD (end stage renal disease) on dialysis ([place])    TTS: [address] [place] (05/15/2013)   Headache(784.0)    "get it from going to dialysis; when they get real bad I come to hospital" (05/15/2013)   Hematemesis/vomiting blood    History of blood transfusion 10/2011; 04/2013   HTN (hypertension) 06/12/2012   Seizures ([place])    Shortness of breath    "just when I have too much potassium is too high" (05/15/2013)    Family History  Problem Relation Age of Onset   Heart attack Father     Social History:  reports that he has been smoking cigars and cigarettes. He has a 50.00 pack-year smoking history. He has never used smokeless tobacco. He reports current drug use. Drugs: Marijuana and Cocaine. He reports that he does not drink alcohol.   Medications Prior to Admission   Medication Sig Dispense Refill Last Dose   acetaminophen (TYLENOL) 500 MG tablet Take 1 tablet (500 mg total) by mouth every 8 (eight) hours as needed for mild pain or fever. 6 times a day, 500 mg, 12 to 24 tabs a day, 6,000 to 12,000 mg daily for pain 30 tablet 0    albuterol (VENTOLIN HFA) 108 (90 Base) MCG/ACT inhaler Inhale 3-6 puffs into the lungs every 6 (six) hours as needed for wheezing or shortness of breath.       amiodarone (PACERONE) 200 MG tablet Take 200 mg by mouth daily.      apixaban (ELIQUIS) 5 MG TABS tablet Take 1 tablet (5 mg total) by mouth 2 (two) times daily. 60 tablet 3    clotrimazole (LOTRIMIN) 1 % cream Apply topically 2 (two) times daily as needed (itching). Back and chest 30 g 0    diphenhydrAMINE (BENADRYL) 25 mg capsule Take 25 mg by mouth every 6 (six) hours as needed for itching.      hydrALAZINE (APRESOLINE) 25 MG tablet Take 1 tablet (25 mg total) by mouth 3 (three) times daily. 270 tablet 3    hydrocortisone cream 1 % Apply 1 application topically 2 (two) times daily as needed for itching.      hydrOXYzine (ATARAX/VISTARIL) 25 MG tablet Take 50 mg by mouth 3 (three) times daily as needed for itching.      metoprolol succinate (TOPROL XL) 50 MG 24 hr tablet Take  1 tablet (50 mg total) by mouth daily. Take with or immediately following a meal. 30 tablet 3    pantoprazole (PROTONIX) 40 MG tablet TAKE 1 TABLET (40 MG TOTAL) BY MOUTH DAILY. (Patient taking differently: Take 40 mg by mouth daily.) 30 tablet 0    sevelamer carbonate (RENVELA) 800 MG tablet Take 2 tablets (1,600 mg total) by mouth 3 (three) times daily with meals. (Patient taking differently: Take 3,200 mg by mouth See admin instructions. 3200mg  three times daily with meals and 800mg  with snacks) 90 tablet 0    sodium zirconium cyclosilicate (LOKELMA) 10 g PACK packet Take 10 g by mouth daily as needed (high potassium).      SPIRIVA RESPIMAT 2.5 MCG/ACT AERS Take 2 puffs by mouth daily.      tolnaftate  (TINACTIN) 1 % spray Apply 1 application topically 2 (two) times daily as needed (itching on arms/back).      vitamin B-12 (CYANOCOBALAMIN) 1000 MCG tablet Take 1,000 mcg by mouth daily.         Exam: Current vital signs: BP (!) 101/25   Pulse 90   Temp 97.7 F (36.5 C) (Oral)   Resp (!) 29   Ht 6\' 1"  (1.854 m)   Wt 69.6 kg   SpO2 (!) 83%   BMI 20.24 kg/m  Vital signs in last 24 hours: Temp:  [93.8 F (34.3 C)-97.7 F (36.5 C)] 97.7 F (36.5 C) (11/25 1149) Pulse Rate:  [42-96] 90 (11/25 1245) Resp:  [7-32] 29 (11/25 1245) BP: (66-182)/(25-136) 101/25 (11/25 1245) SpO2:  [77 %-100 %] 83 % (11/25 1245) FiO2 (%):  [40 %-100 %] 40 % (11/25 1121) Weight:  [69.6 kg] 69.6 kg (11/25 0500)   Physical Exam  Constitutional: Appears well-developed and well-nourished.  Psych: Unable to assess due to intubation eyes: No scleral injection HENT: No OP obstrucion Head: Normocephalic.  Cardiovascular: Normal rate and regular rhythm.  Respiratory: Intubated, coarse breath sounds bilaterally Skin: Warm Neuro: Comatose, PERRLA, corneal reflex absent, gag reflex absent, does not withdraw to noxious stimuli in all 4 extremities, continues to have myoclonic seizures with eye opening and right upper extremity jerking   I have reviewed labs in epic and the results pertinent to this consultation are: CBC:  Recent Labs  Lab 04/14/2021 0122 03/31/2021 0134 04/07/2021 0647 03/30/2021 1154  WBC 10.7*  --  11.7*  --   NEUTROABS 7.2  --   --   --   HGB 12.5*   < > 11.6* 11.2*  HCT 43.0   < > 36.9* 33.0*  MCV 100.2*  --  94.6  --   PLT 217  --  161  --    < > = values in this interval not displayed.    Basic Metabolic Panel:  Lab Results  Component Value Date   NA 128 (L) 03/21/2021   K 6.3 (HH) 04/10/2021   CO2 23 04/03/2021   GLUCOSE 141 (H) 03/31/2021   BUN 119 (H) 04/07/2021   CREATININE 14.58 (H) 04/08/2021   CALCIUM 8.7 (L) 03/16/2021   GFRNONAA 4 (L) 04/06/2021   GFRAA 9 (L)  01/23/2020   Lipid Panel:  Lab Results  Component Value Date   LDLCALC 27 12/19/2020   HgbA1c:  Lab Results  Component Value Date   HGBA1C 5.3 12/19/2020   Urine Drug Screen:     Component Value Date/Time   LABOPIA NEGATIVE 11/05/2011 1441   COCAINSCRNUR POSITIVE (A) 11/05/2011 1441   LABBENZ NEGATIVE 11/05/2011 1441  AMPHETMU NEGATIVE 11/05/2011 1441    Alcohol Level     Component Value Date/Time   ETH <10 03/24/2021 6010     I have reviewed the images obtained: MRI brain without contrast 04/07/2021: No acute finding. No acute infarct or anoxic pattern of ischemia. Small remote infarcts. Left mastoid opacification.   ASSESSMENT/PLAN: 62 old male status post cardiac arrest, 10 minutes to ROSC now with myoclonic status epilepticus.  Cardiac arrest Suspect anoxic/anoxic brain injury Myoclonic status epilepticus -Routine EEG showed status epilepticus  Recommendations: -Recommend increasing propofol for seizure suppression.  If needed, will add Versed next -Status post Keppra 1 g, will start Keppra 500 mg twice daily (renally adjusted) -If any further seizures, can add valproic acid 2000 mg once followed by 500 mg Q8h -Myoclonic status epilepticus within 24 hours cardiac arrest, EEG with suppression and not reacting to noxious stimulation or both indicative of significant neurological injury even though MRI brain does not show any evidence of anoxic/hypoxic brain injury. -For now, goal is to control seizures.  If seizures are controlled, we will leave patient on sedation for about 24 to 48 hours followed by weaning sedation.  However, if seizures recur and we will attempt to wean sedation, this again would be suggestive of significant neurological injury. -Management of rest of comorbidities per primary team   Thank you for allowing [place] to participate in the care of this patient. If you have any further questions, please contact  me or neurohospitalist.    CRITICAL  CARE Performed by: Lora Havens   Total critical care time: 35 minutes  Critical care time was exclusive of separately billable procedures and treating other patients.  Critical care was necessary to treat or prevent imminent or life-threatening deterioration.  Critical care was time spent personally by me on the following activities: development of treatment plan with patient and/or surrogate as well as nursing, discussions with consultants, evaluation of patient's response to treatment, examination of patient, obtaining history from patient or surrogate, ordering and performing treatments and interventions, ordering and review of laboratory studies, ordering and review of radiographic studies, pulse oximetry and re-evaluation of patient's condition.   Zeb Comfort Epilepsy [facility] neurohospitalist

## 2021-04-09 NOTE — Progress Notes (Signed)
eLink Physician-Brief Progress Note Patient Name: Jeremy Mccoy DOB: Jul 29, 1965 MRN: [id number]   Date of Service  03/22/2021  HPI/Events of Note  Hypotension - BP marginal on high dose Propofol and Versed IV infusions for status seizures. No CVL or CVP.  eICU Interventions  Plan: Norepinephrine IV infusion via peripheral IV. Titrate to MAP >= 65.     Intervention Category Major Interventions: Seizures - evaluation and management;Hypotension - evaluation and management  [name], [place] 03/23/2021, 10:12 PM

## 2021-04-09 NOTE — Progress Notes (Signed)
[place] for heparin Indication:  Afib and r/o ACS in setting of cardiac arrest  No Known Allergies  Patient Measurements: Height: 6\' 1"  (185.4 cm) Weight: 68 kg (149 lb 14.6 oz) IBW/kg (Calculated) : 79.9  Vital Signs: Temp: 98.4 F (36.9 C) (11/25 1615) Temp Source: Axillary (11/25 1615) BP: 105/63 (11/25 1900) Pulse Rate: 115 (11/25 1800)  Labs: Recent Labs    03/17/2021 0122 03/25/2021 0134 04/05/2021 0149 03/20/2021 0312 03/31/2021 0356 04/08/2021 0516 03/26/2021 0647 03/27/2021 0839 03/18/2021 0947 04/02/2021 1154 04/12/2021 1819  HGB 12.5* 15.0   < >  --    < > 12.9* 11.6*  --   --  11.2*  --   HCT 43.0 44.0   < >  --    < > 38.0* 36.9*  --   --  33.0*  --   PLT 217  --   --   --   --   --  161  --   --   --   --   APTT  --   --   --   --   --   --   --   --   --   --  46*  HEPARINUNFRC  --   --   --   --   --   --   --   --   --   --  0.92*  CREATININE  --  14.80*  --  14.79*  --   --   --   --  14.58*  --   --   TROPONINIHS  --   --   --   --   --   --  1,531* 1,687* 2,372*  --   --    < > = values in this interval not displayed.     Estimated Creatinine Clearance: 5.5 mL/min (A) (by C-G formula based on SCr of 14.58 mg/dL (H)).   Assessment: 55yo male presents after witnessed cardiac arrest with ROSC in the field >> concern for ACS; pt is on apixaban PTA for Afib.  Pharmacy consulted to dose IV heparin.  Heparin level is elevated and aPTT is sub-therapeutic at 46 sec, consistent with patient taking Eliquis PTA.  No issue with heparin infusion nor bleeding per RN.  Goal of Therapy:  Heparin level 0.3-0.7 units/ml aPTT 66-102 seconds Monitor platelets by anticoagulation protocol: Yes   Plan:  Increase heparin infusion to 1500 units/hr Check 6 hr aPTT  Davinia Riccardi D. Mina Marble, PharmD, BCPS, [place] 03/27/2021, 7:17 PM

## 2021-04-09 NOTE — Procedures (Addendum)
Patient Name: Jeremy Mccoy  MRN: [id number]  Epilepsy Attending: Lora Havens  Referring Physician/Provider: Dr Farrel Gordon Date: 04/01/2021 Duration: 21.22 mins  Patient history: 46 old male status post cardiac arrest.  EEG evaluate for seizures.  Level of alertness:  comatose  AEDs during EEG study: Propofol, LEV  Technical aspects: This EEG study was done with scalp electrodes positioned according to the 10-20 International system of electrode placement. Electrical activity was acquired at a sampling rate of 500Hz  and reviewed with a high frequency filter of 70Hz  and a low frequency filter of 1Hz . EEG data were recorded continuously and digitally stored.   Description: Patient was noted to have near continuous episodes of eye opening as well as bilateral upper extremity jerking.  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  Between seizures, EEG showed generalized background suppression.  EEG was not reactive to stimulation.  Hyperventilation and photic stimulation were not performed.     ABNORMALITY -Myoclonic status epilepticus, generalized -Background suppression, generalized  IMPRESSION: This study showed myoclonic status epilepticus with generalized onset as well as profound diffuse encephalopathy.  With history of cardiac arrest, this EEG is most likely due to anoxic/hypoxic brain injury.  Dr Marlou Sa was notified  Makila Colombe Barbra Sarks

## 2021-04-09 NOTE — Progress Notes (Signed)
NAME:  Jeremy Mccoy, MRN:  [id number], DOB:  1966/03/27, LOS: 0 ADMISSION DATE:  04/01/2021, CONSULTATION DATE:  04/04/2021 REFERRING MD:  EDP, CHIEF COMPLAINT:  PEA arrest   History of Present Illness:  Jeremy Mccoy is a 55 y.o. M who was at home when he had a witnessed arrest. It is unknown if bystander CPR was initiated, but was in CPR when EMS arrived and was given two rounds of epinephrine and Calcium with ROSC achieved after approximately 10 minutes.   He was intubated in the ED.  Labs showed K of 7.5 and pH 6.9 with hypertension. Per report, he may have left dialysis early 11/22. CT head negative.  In the ED he was given bicarb, lokelma and insulin and nephrology was consulted.  Pertinent  Medical History  Anemia, Anxiety, Arthritis, CHF (congestive heart failure) ([place]), COPD (chronic obstructive pulmonary disease) ([place]), Crack cocaine use, Dental caries, Depression, ESRD (end stage renal disease) on dialysis ([place]), Headache(784.0), Hematemesis/vomiting blood, History of blood transfusion (10/2011; 04/2013), HTN (hypertension) (06/12/2012), Seizures ([place]), and Shortness of breath.  Significant Hospital Events: Including procedures, antibiotic start and stop dates in addition to other pertinent events   11/25 PEA arrest, intubated and PCCM admit  Interim History / Subjective:  Jeremy Mccoy is sedated and intubated. Presence of myoclonic jerks vs. Seizure-like activity.   Objective   Blood pressure (!) 83/61, pulse 76, temperature 97.7 F (36.5 C), temperature source Oral, resp. rate (!) 27, height 6\' 1"  (1.854 m), weight 69.6 kg, SpO2 100 %.    Vent Mode: PRVC FiO2 (%):  [40 %-100 %] 40 % Set Rate:  [18 bmp-30 bmp] 30 bmp Vt Set:  [640 mL] 640 mL PEEP:  [5 cmH20] 5 cmH20 Plateau Pressure:  [16 cmH20-18 cmH20] 17 cmH20   Intake/Output Summary (Last 24 hours) at 04/08/2021 1037 Last data filed at 04/12/2021 0640 Gross per 24 hour  Intake 605.11 ml  Output --  Net 605.11 ml    Filed Weights   04/06/2021 0500  Weight: 69.6 kg    Examination: Constitutional: Chronically ill appearing gentleman sedated in bed. No acute distress noted. Eyes: Pupils are pinpoint and non-reactive. Eyes are rolled upwards and fixed. Cardio: Regular rate and rhythm. No murmurs, rubs, gallops. Pulm: Intubated. Clear to auscultation bilaterally. Abdomen: Mildly distended. MSK: Distal LE pulses detectable only by doppler. No peripheral edema noted. AV fistula on L wrist. Skin: Skin is cool and dry. Neuro: Sedated. Persistent myoclonic jerks vs. Seizure-like activity.   Resolved Hospital Problem list      Assessment & Plan:  PEA arrest Acute encephalopathy Patient was reportedly down for ~10 minutes. Hyperkalemic on arrival which may have contributed to arrest; patient does undergo HD M/W/F for ESRD however it is unclear if he completed most recent full session. CT head on arrival was negative however given presence of myoclonus vs. Seizure like activity there is concern for a more extensive anoxic brain injury. Troponin 5027>7412. - Normothermia protocol - Trend troponins - Echocardiogram - MRI brain stat - Discontinue fentanyl drip; can give pushes if needed - Continue propofol - Chest x-ray, ABG PRN - Keppra 1,000 mg loading dose, can transition to keppra 500 mg BID; consider neurology consult - Continuous EEG - Heparin gtt  ESRD on HD M/W/F Anion gap metabolic acidosis Unclear if patient completed last HD session. He has persistent hyperkalemia at this time. ABG improving with most recent result of pH 7.270 pCO2 41.3 pO2 207 bicarb 19 TCO2 20. - Discontinue  bicarb gtt - HD at bedside after MRI - Trend BMP - Repeat Lokelma  COPD - Duonebs PRN  History of atrial fibrillation/flutter - Echo - Heparin gtt - Hold home metoprolol and hydralazine  Best Practice (right click and "Reselect all SmartList Selections" daily)   Diet/type: NPO DVT prophylaxis: prophylactic  heparin  GI prophylaxis: PPI Lines: N/A Foley:  N/A Code Status:  full code Last date of multidisciplinary goals of care discussion [11/25]  Labs   CBC: Recent Labs  Lab 04/12/2021 0122 03/20/2021 0134 03/29/2021 0221 04/12/2021 0304 03/18/2021 0356 04/08/2021 0516 04/05/2021 0647  WBC 10.7*  --   --   --   --   --  11.7*  NEUTROABS 7.2  --   --   --   --   --   --   HGB 12.5*   < > 14.3 13.9 13.3 12.9* 11.6*  HCT 43.0   < > 42.0 41.0 39.0 38.0* 36.9*  MCV 100.2*  --   --   --   --   --  94.6  PLT 217  --   --   --   --   --  161   < > = values in this interval not displayed.    Basic Metabolic Panel: Recent Labs  Lab 04/11/2021 0134 03/27/2021 0149 03/20/2021 0221 03/27/2021 0304 03/16/2021 1478 04/05/2021 0356 04/10/2021 0516 03/27/2021 0647  NA 133*   < > 132* 131* 135 129* 131*  --   K 6.4*   < > 7.1* 7.5* >7.5* 7.2* 6.3*  --   CL 102  --   --   --  93*  --   --   --   CO2  --   --   --   --  16*  --   --   --   GLUCOSE 150*  --   --   --  128*  --   --   --   BUN >130*  --   --   --  114*  --   --   --   CREATININE 14.80*  --   --   --  14.79*  --   --   --   CALCIUM  --   --   --   --  10.4*  --   --   --   MG  --   --   --   --  3.1*  --   --  2.7*   < > = values in this interval not displayed.   GFR: Estimated Creatinine Clearance: 5.6 mL/min (A) (by C-G formula based on SCr of 14.79 mg/dL (H)). Recent Labs  Lab 03/31/2021 0122 03/28/2021 0312 04/04/2021 0647  WBC 10.7*  --  11.7*  LATICACIDVEN  --  7.2*  --     Liver Function Tests: Recent Labs  Lab 03/18/2021 0312  AST 31  ALT 21  ALKPHOS 107  BILITOT 0.9  PROT 7.6  ALBUMIN 3.7   No results for input(s): LIPASE, AMYLASE in the last 168 hours. Recent Labs  Lab 03/22/2021 0312  AMMONIA 44*    ABG    Component Value Date/Time   PHART 7.270 (L) 04/07/2021 0516   PCO2ART 41.3 03/18/2021 0516   PO2ART 207 (H) 04/01/2021 0516   HCO3 19.0 (L) 04/08/2021 0516   TCO2 20 (L) 03/26/2021 0516   ACIDBASEDEF 8.0 (H)  03/30/2021 0516   O2SAT 100.0 03/18/2021 0516     Coagulation Profile:  No results for input(s): INR, PROTIME in the last 168 hours.  Cardiac Enzymes: No results for input(s): CKTOTAL, CKMB, CKMBINDEX, TROPONINI in the last 168 hours.  HbA1C: Hgb A1c MFr Bld  Date/Time Value Ref Range Status  12/19/2020 04:59 AM 5.3 4.8 - 5.6 % Final    Comment:    (NOTE) Pre diabetes:          5.7%-6.4%  Diabetes:              >6.4%  Glycemic control for   <7.0% adults with diabetes   03/27/2020 02:55 AM 5.1 4.8 - 5.6 % Final    Comment:    (NOTE) Pre diabetes:          5.7%-6.4%  Diabetes:              >6.4%  Glycemic control for   <7.0% adults with diabetes     CBG: Recent Labs  Lab 03/23/2021 0417 03/20/2021 0630 03/24/2021 0735  GLUCAP 181* 159* 136*    Review of Systems:   Review of Systems  Unable to perform ROS: Intubated    Past Medical History:  He,  has a past medical history of Anemia, Anxiety, Arthritis, CHF (congestive heart failure) ([place]), COPD (chronic obstructive pulmonary disease) ([place]), Crack cocaine use, Dental caries, Depression, ESRD (end stage renal disease) on dialysis ([place]), Headache(784.0), Hematemesis/vomiting blood, History of blood transfusion (10/2011; 04/2013), HTN (hypertension) (06/12/2012), Seizures ([place]), and Shortness of breath.   Surgical History:   Past Surgical History:  Procedure Laterality Date   AV FISTULA PLACEMENT  11/29/2011   Procedure: ARTERIOVENOUS (AV) FISTULA CREATION;  Surgeon: Angelia Mould, MD;  Location: [facility];  Service: Vascular;  Laterality: Left;   CARDIOVERSION N/A 03/31/2020   Procedure: CARDIOVERSION;  Surgeon: Lelon Perla, MD;  Location: [facility] ENDOSCOPY;  Service: Cardiovascular;  Laterality: N/A;   ESOPHAGOGASTRODUODENOSCOPY N/A 05/17/2013   Procedure: ESOPHAGOGASTRODUODENOSCOPY (EGD);  Surgeon: Beryle Beams, MD;  [facility] ENDOSCOPY;  Service: Endoscopy;  Laterality: N/A;   INGUINAL HERNIA REPAIR Bilateral  1967   IR FLUORO GUIDE CV LINE RIGHT  10/20/2017   IR THROMBECTOMY AV FISTULA W/THROMBOLYSIS/PTA INC/SHUNT/IMG LEFT Left 10/24/2017   IR US GUIDE VASC ACCESS LEFT  10/24/2017   IR US GUIDE VASC ACCESS RIGHT  10/20/2017   RENAL BIOPSY  11/07/2011   TEE WITHOUT CARDIOVERSION N/A 03/31/2020   Procedure: TRANSESOPHAGEAL ECHOCARDIOGRAM (TEE);  Surgeon: Lelon Perla, MD;  Location: [facility] ENDOSCOPY;  Service: Cardiovascular;  Laterality: N/A;     Social History:   reports that he has been smoking cigars and cigarettes. He has a 50.00 pack-year smoking history. He has never used smokeless tobacco. He reports current drug use. Drugs: Marijuana and Cocaine. He reports that he does not drink alcohol.   Family History:  His family history includes Heart attack in his father.   Allergies No Known Allergies   Home Medications  Prior to Admission medications   Medication Sig Start Date End Date Taking? Authorizing Provider  acetaminophen (TYLENOL) 500 MG tablet Take 1 tablet (500 mg total) by mouth every 8 (eight) hours as needed for mild pain or fever. 6 times a day, 500 mg, 12 to 24 tabs a day, 6,000 to 12,000 mg daily for pain 08/05/16   Molt, Bethany, DO  albuterol (VENTOLIN HFA) 108 (90 Base) MCG/ACT inhaler Inhale 3-6 puffs into the lungs every 6 (six) hours as needed for wheezing or shortness of breath.     [provider]  amiodarone (PACERONE) 200 MG tablet Take 200 mg by mouth daily.    [provider]  apixaban (ELIQUIS) 5 MG TABS tablet Take 1 tablet (5 mg total) by mouth 2 (two) times daily. 01/28/21 05/22/21  Deberah Pelton, NP  clotrimazole (LOTRIMIN) 1 % cream Apply topically 2 (two) times daily as needed (itching). Back and chest 01/30/21   Alma Friendly, MD  diphenhydrAMINE (BENADRYL) 25 mg capsule Take 25 mg by mouth every 6 (six) hours as needed for itching.    [provider]  hydrALAZINE (APRESOLINE) 25 MG tablet Take 1 tablet (25 mg total) by mouth 3  (three) times daily. 05/26/20   Richardson Dopp T, PA-C  hydrocortisone cream 1 % Apply 1 application topically 2 (two) times daily as needed for itching.    [provider]  hydrOXYzine (ATARAX/VISTARIL) 25 MG tablet Take 50 mg by mouth 3 (three) times daily as needed for itching.    [provider]  metoprolol succinate (TOPROL XL) 50 MG 24 hr tablet Take 1 tablet (50 mg total) by mouth daily. Take with or immediately following a meal. 02/04/21 02/04/22  Fenton, Clint R, PA  pantoprazole (PROTONIX) 40 MG tablet TAKE 1 TABLET (40 MG TOTAL) BY MOUTH DAILY. Patient taking differently: Take 40 mg by mouth daily. 04/01/20 05/22/21  Ezequiel Essex, MD  sevelamer carbonate (RENVELA) 800 MG tablet Take 2 tablets (1,600 mg total) by mouth 3 (three) times daily with meals. Patient taking differently: Take 3,200 mg by mouth See admin instructions. 3200mg  three times daily with meals and 800mg  with snacks 01/25/14   McLean-Scocuzza, Nino Glow, MD  sodium zirconium cyclosilicate (LOKELMA) 10 g PACK packet Take 10 g by mouth daily as needed (high potassium).    [provider]  SPIRIVA RESPIMAT 2.5 MCG/ACT AERS Take 2 puffs by mouth daily. 01/28/21   [provider]  tolnaftate (TINACTIN) 1 % spray Apply 1 application topically 2 (two) times daily as needed (itching on arms/back).    [provider]  vitamin B-12 (CYANOCOBALAMIN) 1000 MCG tablet Take 1,000 mcg by mouth daily.    [provider]     Critical care time: 45 minutes

## 2021-04-09 NOTE — H&P (Signed)
NAME:  NIMROD WENDT, MRN:  [id number], DOB:  06-14-1965, LOS: 0 ADMISSION DATE:  03/28/2021, CONSULTATION DATE:  03/21/2021 REFERRING MD:  EDP, CHIEF COMPLAINT:  PEA arrest   History of Present Illness:  Hilary Pundt is a 55 y.o. M with PMH of ESRD on HD m/w/f, Atrial fib/fl;utter on Eliquis, COPD, HFrEF, HTN who was at home when he had a witnessed arrest.  Unknown if bystander CPR was initiated, but was in CPR when EMS arrived and was given Epix2 and Calcium and ROSC achieved after approximately 10 minutes.   He was intubated in the ED.  Labs showed K of 7.5 and pH 6.9 with hypertension, per report, he may have left dialysis early 11/22.   CT head negative.  He was given bicarb, lokelma and insulin and nephrology consulted.     Pertinent  Medical History   has a past medical history of Anemia, Anxiety, Arthritis, CHF (congestive heart failure) ([place]), COPD (chronic obstructive pulmonary disease) ([place]), Crack cocaine use, Dental caries, Depression, ESRD (end stage renal disease) on dialysis ([place]), Headache(784.0), Hematemesis/vomiting blood, History of blood transfusion (10/2011; 04/2013), HTN (hypertension) (06/12/2012), Seizures ([place]), and Shortness of breath.   Significant Hospital Events: Including procedures, antibiotic start and stop dates in addition to other pertinent events   11/25 PEA arrest, intubated and PCCM admit  Interim History / Subjective:  Pt agitated and dyssynchronous with vent despite Propofol 61mcg  Objective   Blood pressure (!) 166/102, pulse 86, temperature (!) 93.8 F (34.3 C), temperature source Rectal, resp. rate (!) 30, height 6\' 1"  (1.854 m), SpO2 100 %.    Vent Mode: PRVC FiO2 (%):  [40 %-100 %] 40 % Set Rate:  [18 bmp-30 bmp] 30 bmp Vt Set:  [640 mL] 640 mL PEEP:  [5 cmH20] 5 cmH20 Plateau Pressure:  [16 cmH20-18 cmH20] 16 cmH20  No intake or output data in the 24 hours ending 04/08/2021 0436 There were no vitals filed for this visit.  General:   critically ill-appearing M intubated and sedated  HEENT: MM pink/moist, ETT in place, sclera anicteric, pupils 71mm equal and sluggishly reactive Neuro: examined on propofol, unresponsive to voice, +gag, no purposeful movements, overbreathing vent  CV: s1s2 rrr, no m/r/g PULM:  expiratory wheezing bilaterally  GI: soft, bsx4 active  Extremities: warm/dry, no edema  Skin: no rashes or lesions  Resolved Hospital Problem list     Assessment & Plan:    Witnessed out of hospital PEA arrest with acute encephalopathy Approximately 10 minutes down time Possibly secondary to hyperkalemia, anterior changes on post-arrest EKG CTH negative -admit to ICU for supportive care -normothermia protocol -trend troponins and start heparin gtt -echo -concern for hypoxic/ischemic injury EEG and MRI in 2-3 days if not improving -Fentanyl/propofol for pain and sedation -Maintain full vent support with SAT/SBT as tolerated -titrate Vent setting to maintain SpO2 greater than or equal to 90%. -HOB elevated 30 degrees. -Plateau pressures less than 30 cm H20.  -Follow chest x-ray, ABG prn.   -Bronchial hygiene and RT/bronchodilator protocol.      ESRD on HD with acute hyperkalemia, hypermagnesemia K 7.5, received temporizing measures in the ED -seen by nephrology, plan for RRT tonight -continue bicarb gtt -monitor electrolytes    COPD -duonebs nebs prn    AGMA -likely uremic from ESRD -HD and trend    Hx atrial fib/flutter, HFrEF, HTN EF 35% in 2021, improved to 50-55% on last echo three months ago -repeat echo -hold Eliquis while on heparin gtt -  hold home metoprolol and hydralazine overnight while immediately post-code, if BP remains elevated resume    Best Practice (right click and "Reselect all SmartList Selections" daily)   Diet/type: NPO DVT prophylaxis: systemic heparin GI prophylaxis: PPI Lines: N/A Foley:  N/A Code Status:  full code Last date of multidisciplinary  goals of care discussion [Pt's sister updated by Dr. Carson Myrtle 11/25]  Labs   CBC: Recent Labs  Lab 03/22/2021 0122 03/20/2021 0134 04/06/2021 0149 03/26/2021 0221 04/07/2021 0304 04/05/2021 0356  WBC 10.7*  --   --   --   --   --   NEUTROABS 7.2  --   --   --   --   --   HGB 12.5* 15.0 14.3  13.3 14.3 13.9 13.3  HCT 43.0 44.0 42.0  39.0 42.0 41.0 39.0  MCV 100.2*  --   --   --   --   --   PLT 217  --   --   --   --   --     Basic Metabolic Panel: Recent Labs  Lab 04/06/2021 0134 03/20/2021 0149 04/07/2021 0221 03/28/2021 0304 03/23/2021 0312 03/16/2021 0356  NA 133* 132*  131* 132* 131*  --  129*  K 6.4* 6.5*  6.7* 7.1* 7.5*  --  7.2*  CL 102  --   --   --   --   --   GLUCOSE 150*  --   --   --   --   --   BUN >130*  --   --   --   --   --   CREATININE 14.80*  --   --   --   --   --   MG  --   --   --   --  3.1*  --    GFR: CrCl cannot be calculated (Unknown ideal weight.). Recent Labs  Lab 04/03/2021 0122  WBC 10.7*    Liver Function Tests: No results for input(s): AST, ALT, ALKPHOS, BILITOT, PROT, ALBUMIN in the last 168 hours. No results for input(s): LIPASE, AMYLASE in the last 168 hours. Recent Labs  Lab 03/28/2021 0312  AMMONIA 44*    ABG    Component Value Date/Time   PHART 7.233 (L) 04/14/2021 0356   PCO2ART 39.2 04/14/2021 0356   PO2ART 154 (H) 03/19/2021 0356   HCO3 16.5 (L) 03/25/2021 0356   TCO2 18 (L) 04/14/2021 0356   ACIDBASEDEF 10.0 (H) 03/30/2021 0356   O2SAT 99.0 03/27/2021 0356     Coagulation Profile: No results for input(s): INR, PROTIME in the last 168 hours.  Cardiac Enzymes: No results for input(s): CKTOTAL, CKMB, CKMBINDEX, TROPONINI in the last 168 hours.  HbA1C: Hgb A1c MFr Bld  Date/Time Value Ref Range Status  12/19/2020 04:59 AM 5.3 4.8 - 5.6 % Final    Comment:    (NOTE) Pre diabetes:          5.7%-6.4%  Diabetes:              >6.4%  Glycemic control for   <7.0% adults with diabetes   03/27/2020 02:55 AM 5.1 4.8 - 5.6 % Final     Comment:    (NOTE) Pre diabetes:          5.7%-6.4%  Diabetes:              >6.4%  Glycemic control for   <7.0% adults with diabetes     CBG: Recent Labs  Lab 03/27/2021 0417  GLUCAP 181*    Review of Systems:   Unable to obtain secondary to mental status and intubation  Past Medical History:  He,  has a past medical history of Anemia, Anxiety, Arthritis, CHF (congestive heart failure) ([place]), COPD (chronic obstructive pulmonary disease) ([place]), Crack cocaine use, Dental caries, Depression, ESRD (end stage renal disease) on dialysis ([place]), Headache(784.0), Hematemesis/vomiting blood, History of blood transfusion (10/2011; 04/2013), HTN (hypertension) (06/12/2012), Seizures ([place]), and Shortness of breath.   Surgical History:   Past Surgical History:  Procedure Laterality Date   AV FISTULA PLACEMENT  11/29/2011   Procedure: ARTERIOVENOUS (AV) FISTULA CREATION;  Surgeon: Angelia Mould, MD;  Location: [place];  Service: Vascular;  Laterality: Left;   CARDIOVERSION N/A 03/31/2020   Procedure: CARDIOVERSION;  Surgeon: Lelon Perla, MD;  Location: [facility] ENDOSCOPY;  Service: Cardiovascular;  Laterality: N/A;   ESOPHAGOGASTRODUODENOSCOPY N/A 05/17/2013   Procedure: ESOPHAGOGASTRODUODENOSCOPY (EGD);  Surgeon: Beryle Beams, MD;  Location: [facility] ENDOSCOPY;  Service: Endoscopy;  Laterality: N/A;   INGUINAL HERNIA REPAIR Bilateral 1967   IR FLUORO GUIDE CV LINE RIGHT  10/20/2017   IR THROMBECTOMY AV FISTULA W/THROMBOLYSIS/PTA INC/SHUNT/IMG LEFT Left 10/24/2017   IR US GUIDE VASC ACCESS LEFT  10/24/2017   IR US GUIDE VASC ACCESS RIGHT  10/20/2017   RENAL BIOPSY  11/07/2011   TEE WITHOUT CARDIOVERSION N/A 03/31/2020   Procedure: TRANSESOPHAGEAL ECHOCARDIOGRAM (TEE);  Surgeon: Lelon Perla, MD;  Location: [facility] ENDOSCOPY;  Service: Cardiovascular;  Laterality: N/A;     Social History:   reports that he has been smoking cigars and cigarettes. He has a 50.00 pack-year smoking history. He has  never used smokeless tobacco. He reports current drug use. Drugs: Marijuana and Cocaine. He reports that he does not drink alcohol.   Family History:  His family history includes Heart attack in his father.   Allergies No Known Allergies   Home Medications  Prior to Admission medications   Medication Sig Start Date End Date Taking? Authorizing Provider  acetaminophen (TYLENOL) 500 MG tablet Take 1 tablet (500 mg total) by mouth every 8 (eight) hours as needed for mild pain or fever. 6 times a day, 500 mg, 12 to 24 tabs a day, 6,000 to 12,000 mg daily for pain 08/05/16   Molt, Bethany, DO  albuterol (VENTOLIN HFA) 108 (90 Base) MCG/ACT inhaler Inhale 3-6 puffs into the lungs every 6 (six) hours as needed for wheezing or shortness of breath.     [provider]  apixaban (ELIQUIS) 5 MG TABS tablet Take 1 tablet (5 mg total) by mouth 2 (two) times daily. 01/28/21 03/11/21  Deberah Pelton, NP  clotrimazole (LOTRIMIN) 1 % cream Apply topically 2 (two) times daily as needed (itching). Back and chest 01/30/21   Alma Friendly, MD  diphenhydrAMINE (BENADRYL) 25 mg capsule Take 25 mg by mouth every 6 (six) hours as needed for itching.    [provider]  doxercalciferol (HECTOROL) 0.5 MCG capsule Doxercalciferol (Hectorol) 08/05/20 08/04/21  [provider]  hydrALAZINE (APRESOLINE) 25 MG tablet Take 1 tablet (25 mg total) by mouth 3 (three) times daily. 05/26/20   Richardson Dopp T, PA-C  hydrocortisone cream 1 % Apply 1 application topically 2 (two) times daily as needed for itching.    [provider]  hydrOXYzine (ATARAX/VISTARIL) 25 MG tablet Take 50 mg by mouth 3 (three) times daily as needed for itching.    [provider]  Methoxy PEG-Epoetin Beta (MIRCERA IJ) Mircera 06/15/20 06/14/21  [provider]  metoprolol succinate (TOPROL XL) 50 MG 24 hr tablet Take 1 tablet (50 mg total) by mouth daily. Take with or immediately following a meal.  02/04/21 02/04/22  Fenton, Clint R, PA  pantoprazole (PROTONIX) 40 MG tablet TAKE 1 TABLET (40 MG TOTAL) BY MOUTH DAILY. 04/01/20 04/01/21  Ezequiel Essex, MD  sevelamer carbonate (RENVELA) 800 MG tablet Take 2 tablets (1,600 mg total) by mouth 3 (three) times daily with meals. 01/25/14   McLean-Scocuzza, Nino Glow, MD  sodium zirconium cyclosilicate (LOKELMA) 10 g PACK packet Take 10 g by mouth daily as needed (high potassium).    [provider]  SPIRIVA RESPIMAT 2.5 MCG/ACT AERS Take 2 puffs by mouth daily. 01/28/21   [provider]  tolnaftate (TINACTIN) 1 % spray Apply 1 application topically 2 (two) times daily as needed (itching on arms/back).    [provider]     Critical care time: 45 minutes     CRITICAL CARE Performed by: Otilio Carpen Samiha Denapoli   Total critical care time: 45 minutes  Critical care time was exclusive of separately billable procedures and treating other patients.  Critical care was necessary to treat or prevent imminent or life-threatening deterioration.  Critical care was time spent personally by me on the following activities: development of treatment plan with patient and/or surrogate as well as nursing, discussions with consultants, evaluation of patient's response to treatment, examination of patient, obtaining history from patient or surrogate, ordering and performing treatments and interventions, ordering and review of laboratory studies, ordering and review of radiographic studies, pulse oximetry and re-evaluation of patient's condition.   Otilio Carpen Leonilda Cozby, PA-C [facility] See Amion for pager If no response to pager , please call [phone] until 7pm After 7:00 pm call Elink  [phone]

## 2021-04-10 ENCOUNTER — Other Ambulatory Visit (HOSPITAL_COMMUNITY): Payer: Medicare Other

## 2021-04-10 ENCOUNTER — Inpatient Hospital Stay (HOSPITAL_COMMUNITY): Payer: Medicare Other

## 2021-04-10 DIAGNOSIS — G40901 Epilepsy, unspecified, not intractable, with status epilepticus: Secondary | ICD-10-CM | POA: Diagnosis not present

## 2021-04-10 DIAGNOSIS — I469 Cardiac arrest, cause unspecified: Secondary | ICD-10-CM

## 2021-04-10 DIAGNOSIS — G253 Myoclonus: Secondary | ICD-10-CM | POA: Diagnosis not present

## 2021-04-10 DIAGNOSIS — J9602 Acute respiratory failure with hypercapnia: Secondary | ICD-10-CM | POA: Diagnosis not present

## 2021-04-10 LAB — BASIC METABOLIC PANEL
Anion gap: 17 — ABNORMAL HIGH (ref 5–15)
Anion gap: 20 — ABNORMAL HIGH (ref 5–15)
Anion gap: 19 — ABNORMAL HIGH (ref 5–15)
Anion gap: 19 — ABNORMAL HIGH (ref 5–15)
BUN: 77 mg/dL — ABNORMAL HIGH (ref 6–20)
BUN: 82 mg/dL — ABNORMAL HIGH (ref 6–20)
BUN: 90 mg/dL — ABNORMAL HIGH (ref 6–20)
BUN: 95 mg/dL — ABNORMAL HIGH (ref 6–20)
CO2: 23 mmol/L (ref 22–32)
CO2: 20 mmol/L — ABNORMAL LOW (ref 22–32)
CO2: 21 mmol/L — ABNORMAL LOW (ref 22–32)
CO2: 19 mmol/L — ABNORMAL LOW (ref 22–32)
Calcium: 8.2 mg/dL — ABNORMAL LOW (ref 8.9–10.3)
Calcium: 7.7 mg/dL — ABNORMAL LOW (ref 8.9–10.3)
Calcium: 8.1 mg/dL — ABNORMAL LOW (ref 8.9–10.3)
Calcium: 7.8 mg/dL — ABNORMAL LOW (ref 8.9–10.3)
Chloride: 93 mmol/L — ABNORMAL LOW (ref 98–111)
Chloride: 92 mmol/L — ABNORMAL LOW (ref 98–111)
Chloride: 91 mmol/L — ABNORMAL LOW (ref 98–111)
Chloride: 91 mmol/L — ABNORMAL LOW (ref 98–111)
Creatinine, Ser: 10.79 mg/dL — ABNORMAL HIGH (ref 0.61–1.24)
Creatinine, Ser: 11.05 mg/dL — ABNORMAL HIGH (ref 0.61–1.24)
Creatinine, Ser: 11.5 mg/dL — ABNORMAL HIGH (ref 0.61–1.24)
Creatinine, Ser: 11.97 mg/dL — ABNORMAL HIGH (ref 0.61–1.24)
GFR, Estimated: 5 mL/min — ABNORMAL LOW (ref >60)
GFR, Estimated: 5 mL/min — ABNORMAL LOW (ref >60)
GFR, Estimated: 5 mL/min — ABNORMAL LOW (ref >60)
GFR, Estimated: 5 mL/min — ABNORMAL LOW (ref >60)
Glucose, Bld: 113 mg/dL — ABNORMAL HIGH (ref 70–99)
Glucose, Bld: 120 mg/dL — ABNORMAL HIGH (ref 70–99)
Glucose, Bld: 120 mg/dL — ABNORMAL HIGH (ref 70–99)
Glucose, Bld: 118 mg/dL — ABNORMAL HIGH (ref 70–99)
Potassium: 5.2 mmol/L — ABNORMAL HIGH (ref 3.5–5.1)
Potassium: 4.7 mmol/L (ref 3.5–5.1)
Potassium: 4.6 mmol/L (ref 3.5–5.1)
Potassium: 4.9 mmol/L (ref 3.5–5.1)
Sodium: 133 mmol/L — ABNORMAL LOW (ref 135–145)
Sodium: 132 mmol/L — ABNORMAL LOW (ref 135–145)
Sodium: 131 mmol/L — ABNORMAL LOW (ref 135–145)
Sodium: 129 mmol/L — ABNORMAL LOW (ref 135–145)

## 2021-04-10 LAB — LACTIC ACID, PLASMA
Lactic Acid, Venous: 2 mmol/L (ref 0.5–1.9)
Lactic Acid, Venous: 1.7 mmol/L (ref 0.5–1.9)

## 2021-04-10 LAB — ECHOCARDIOGRAM COMPLETE
AR max vel: 2.22 cm2
AV Area VTI: 2.29 cm2
AV Area mean vel: 2.13 cm2
AV Mean grad: 7 mmHg
AV Peak grad: 11.7 mmHg
Ao pk vel: 1.71 m/s
Calc EF: 56.8 %
Height: 73 in
MV VTI: 1.41 cm2
S' Lateral: 2.8 cm
Single Plane A2C EF: 55.9 %
Single Plane A4C EF: 60.1 %
Weight: 2836 oz

## 2021-04-10 LAB — POCT I-STAT 7, (LYTES, BLD GAS, ICA,H+H)
Acid-base deficit: 1 mmol/L (ref 0.0–2.0)
Acid-base deficit: 2 mmol/L (ref 0.0–2.0)
Acid-base deficit: 3 mmol/L — ABNORMAL HIGH (ref 0.0–2.0)
Bicarbonate: 24.5 mmol/L (ref 20.0–28.0)
Bicarbonate: 24.2 mmol/L (ref 20.0–28.0)
Bicarbonate: 22.7 mmol/L (ref 20.0–28.0)
Calcium, Ion: 1.03 mmol/L — ABNORMAL LOW (ref 1.15–1.40)
Calcium, Ion: 1.09 mmol/L — ABNORMAL LOW (ref 1.15–1.40)
Calcium, Ion: 1.02 mmol/L — ABNORMAL LOW (ref 1.15–1.40)
HCT: 29 % — ABNORMAL LOW (ref 39.0–52.0)
HCT: 31 % — ABNORMAL LOW (ref 39.0–52.0)
HCT: 29 % — ABNORMAL LOW (ref 39.0–52.0)
Hemoglobin: 9.9 g/dL — ABNORMAL LOW (ref 13.0–17.0)
Hemoglobin: 10.5 g/dL — ABNORMAL LOW (ref 13.0–17.0)
Hemoglobin: 9.9 g/dL — ABNORMAL LOW (ref 13.0–17.0)
O2 Saturation: 99 %
O2 Saturation: 98 %
O2 Saturation: 98 %
Patient temperature: 100
Patient temperature: 99.6
Patient temperature: 99.6
Potassium: 4.6 mmol/L (ref 3.5–5.1)
Potassium: 4.6 mmol/L (ref 3.5–5.1)
Potassium: 4.7 mmol/L (ref 3.5–5.1)
Sodium: 130 mmol/L — ABNORMAL LOW (ref 135–145)
Sodium: 131 mmol/L — ABNORMAL LOW (ref 135–145)
Sodium: 129 mmol/L — ABNORMAL LOW (ref 135–145)
TCO2: 26 mmol/L (ref 22–32)
TCO2: 26 mmol/L (ref 22–32)
TCO2: 24 mmol/L (ref 22–32)
pCO2 arterial: 43.9 mmHg (ref 32.0–48.0)
pCO2 arterial: 46.1 mmHg (ref 32.0–48.0)
pCO2 arterial: 42.4 mmHg (ref 32.0–48.0)
pH, Arterial: 7.358 (ref 7.350–7.450)
pH, Arterial: 7.33 — ABNORMAL LOW (ref 7.350–7.450)
pH, Arterial: 7.338 — ABNORMAL LOW (ref 7.350–7.450)
pO2, Arterial: 129 mmHg — ABNORMAL HIGH (ref 83.0–108.0)
pO2, Arterial: 119 mmHg — ABNORMAL HIGH (ref 83.0–108.0)
pO2, Arterial: 118 mmHg — ABNORMAL HIGH (ref 83.0–108.0)

## 2021-04-10 LAB — APTT
aPTT: 49 seconds — ABNORMAL HIGH (ref 24–36)
aPTT: 58 seconds — ABNORMAL HIGH (ref 24–36)
aPTT: 59 seconds — ABNORMAL HIGH (ref 24–36)

## 2021-04-10 LAB — CBC
HCT: 32 % — ABNORMAL LOW (ref 39.0–52.0)
Hemoglobin: 10.4 g/dL — ABNORMAL LOW (ref 13.0–17.0)
MCH: 29.8 pg (ref 26.0–34.0)
MCHC: 32.5 g/dL (ref 30.0–36.0)
MCV: 91.7 fL (ref 80.0–100.0)
Platelets: 126 10*3/uL — ABNORMAL LOW (ref 150–400)
RBC: 3.49 MIL/uL — ABNORMAL LOW (ref 4.22–5.81)
RDW: 18.4 % — ABNORMAL HIGH (ref 11.5–15.5)
WBC: 10.2 10*3/uL (ref 4.0–10.5)
nRBC: 0 % (ref 0.0–0.2)

## 2021-04-10 LAB — GLUCOSE, CAPILLARY
Glucose-Capillary: 96 mg/dL (ref 70–99)
Glucose-Capillary: 104 mg/dL — ABNORMAL HIGH (ref 70–99)
Glucose-Capillary: 110 mg/dL — ABNORMAL HIGH (ref 70–99)
Glucose-Capillary: 116 mg/dL — ABNORMAL HIGH (ref 70–99)
Glucose-Capillary: 108 mg/dL — ABNORMAL HIGH (ref 70–99)
Glucose-Capillary: 104 mg/dL — ABNORMAL HIGH (ref 70–99)

## 2021-04-10 LAB — HEPARIN LEVEL (UNFRACTIONATED): Heparin Unfractionated: 0.52 IU/mL (ref 0.30–0.70)

## 2021-04-10 LAB — TROPONIN I (HIGH SENSITIVITY)
Troponin I (High Sensitivity): 2160 ng/L (ref <18)
Troponin I (High Sensitivity): 1851 ng/L (ref <18)

## 2021-04-10 LAB — TRIGLYCERIDES: Triglycerides: 79 mg/dL (ref <150)

## 2021-04-10 NOTE — Progress Notes (Signed)
ANTICOAGULATION CONSULT NOTE  Pharmacy Consult for heparin Indication:  Afib and r/o ACS in setting of cardiac arrest  No Known Allergies  Patient Measurements: Height: 6\' 1"  (185.4 cm) Weight: 80.4 kg (177 lb 4 oz) IBW/kg (Calculated) : 79.9  Vital Signs: Temp: 99.5 F (37.5 C) (11/26 1200) Temp Source: Axillary (11/26 1200) BP: 108/71 (11/26 1245) Pulse Rate: 99 (11/26 1300)  Labs: Recent Labs    03/28/2021 0122 03/22/2021 0134 03/20/2021 0647 04/14/2021 0839 03/26/2021 0947 03/22/2021 1154 03/26/2021 1819 04/06/2021 2151 04/10/21 0222 04/10/21 0500 04/10/21 0752 04/10/21 1010 04/10/21 1119 04/10/21 1304  HGB 12.5*   < > 11.6*  --   --  11.2*  --   --  10.4*  --   --   --  9.9*  --   HCT 43.0   < > 36.9*  --   --  33.0*  --   --  32.0*  --   --   --  29.0*  --   PLT 217  --  161  --   --   --   --   --  126*  --   --   --   --   --   APTT  --   --   --   --   --   --  46*  --  49*  --   --   --   --  58*  HEPARINUNFRC  --   --   --   --   --   --  0.92*  --   --  0.52  --   --   --   --   CREATININE  --    < >  --   --  14.58*  --  9.52* 9.02* 10.79*  --  11.05*  --   --   --   TROPONINIHS  --   --  1,531*   < > 2,372*  --   --   --   --   --  2,160* 1,851*  --   --    < > = values in this interval not displayed.    Estimated Creatinine Clearance: 8.5 mL/min (A) (by C-G formula based on SCr of 11.05 mg/dL (H)).   Assessment: 55yo male presents after witnessed cardiac arrest with ROSC in the field >> concern for ACS; pt is on apixaban PTA for Afib.  Pharmacy consulted to dose IV heparin.  Heparin level elevated on initial check consistent with recent Eliquis - trending down.  Utilizing aPTT for monitoring until levels are correlating.  aPTT this PM is 58 - still below goal but trending up on Heparin at 1750 units/hr. No issue with heparin infusion nor bleeding per RN.  Goal of Therapy:  Heparin level 0.3-0.7 units/ml aPTT 66-102 seconds Monitor platelets by  anticoagulation protocol: Yes   Plan:  Increase heparin infusion to 2000 units/hr Re-check 8 hr aPTT  Sloan Leiter, PharmD, BCPS, BCCCP Clinical Pharmacist Please refer to [facility] for [place] numbers 04/10/2021, 1:39 PM

## 2021-04-10 NOTE — Progress Notes (Signed)
NAME:  Jeremy Mccoy, MRN:  [id number], DOB:  December 06, 1965, LOS: 1 ADMISSION DATE:  04/14/2021, CONSULTATION DATE:  03/18/2021 REFERRING MD:  EDP, CHIEF COMPLAINT:  PEA arrest   History of Present Illness:  Jeremy Mccoy is a 55 y.o. M who was at home when he had a witnessed arrest. It is unknown if bystander CPR was initiated, but was in CPR when EMS arrived and was given two rounds of epinephrine and Calcium with ROSC achieved after approximately 10 minutes.   He was intubated in the ED.  Labs showed K of 7.5 and pH 6.9 with hypertension. Per report, he may have left dialysis early 11/22. CT head negative.  In the ED he was given bicarb, lokelma and insulin and nephrology was consulted.  Pertinent  Medical History  Anemia, Anxiety, Arthritis, CHF (congestive heart failure) ([place]), COPD (chronic obstructive pulmonary disease) ([place]), Crack cocaine use, Dental caries, Depression, ESRD (end stage renal disease) on dialysis ([place]), Headache(784.0), Hematemesis/vomiting blood, History of blood transfusion (10/2011; 04/2013), HTN (hypertension) (06/12/2012), Seizures ([place]), and Shortness of breath.  Significant Hospital Events: Including procedures, antibiotic start and stop dates in addition to other pertinent events   11/25 PEA arrest, intubated and PCCM admit  Interim History / Subjective:   No acute events overnight.   Versed and propofol increased yesterday for burst suppression. Remains on cEEG.  Objective   Blood pressure 101/63, pulse (!) 104, temperature 100 F (37.8 C), temperature source Axillary, resp. rate (!) 24, height 6\' 1"  (1.854 m), weight 80.4 kg, SpO2 98 %.    Vent Mode: PRVC FiO2 (%):  [40 %] 40 % Set Rate:  [24 bmp-30 bmp] 24 bmp Vt Set:  [640 mL] 640 mL PEEP:  [5 cmH20] 5 cmH20 Plateau Pressure:  [17 cmH20-19 cmH20] 19 cmH20   Intake/Output Summary (Last 24 hours) at 04/10/2021 0818 Last data filed at 04/10/2021 0700 Gross per 24 hour  Intake 2571.27 ml  Output  2000 ml  Net 571.27 ml    Filed Weights   03/17/2021 1410 04/11/2021 1710 04/10/21 0430  Weight: 70 kg 68 kg 80.4 kg    Examination: Constitutional: chronically ill male, no acute distress Eyes: Pupils are pinpoint and non-reactive, sclera anicteric Cardio: Regular rate and rhythm. No murmurs, rubs, gallops. Pulm: Intubated. Clear to auscultation bilaterally. Abdomen: soft, non-distended MSK: no edema, warm Skin: Skin is cool and dry. Neuro: [facility] Problem list      Assessment & Plan:  PEA arrest In setting of hyperkalemia - Trend troponins - Echo pending - Heparin gtt  Acute encephalopathy Status Epilepticus - Neurology following - cEEG - Keppra 1,000 mg loading dose, can transition to keppra 500 mg BID - continue burst suppression with propofol and versed  ESRD on HD M/W/F Anion gap metabolic acidosis Mild Hyponatremia Hyperkalemia Lactic Acidosis, resolved - HD per nephrology - Trend BMP - continue Lokelma  Anemia of Chronic disease - transfuse for hgb <7g/dL  COPD - Duonebs PRN  History of atrial fibrillation/flutter - Echo - Heparin gtt - Hold home metoprolol and hydralazine  Best Practice (right click and "Reselect all SmartList Selections" daily)   Diet/type: tubefeeds DVT prophylaxis: prophylactic heparin  GI prophylaxis: PPI Lines: N/A Foley:  N/A Code Status:  full code Last date of multidisciplinary goals of care discussion [11/25]  Labs   CBC: Recent Labs  Lab 03/16/2021 0122 03/28/2021 0134 03/23/2021 0356 04/08/2021 0516 03/26/2021 0647 04/03/2021 1154 04/10/21 0222  WBC 10.7*  --   --   --  11.7*  --  10.2  NEUTROABS 7.2  --   --   --   --   --   --   HGB 12.5*   < > 13.3 12.9* 11.6* 11.2* 10.4*  HCT 43.0   < > 39.0 38.0* 36.9* 33.0* 32.0*  MCV 100.2*  --   --   --  94.6  --  91.7  PLT 217  --   --   --  161  --  126*   < > = values in this interval not displayed.     Basic Metabolic Panel: Recent Labs  Lab  03/20/2021 0312 04/12/2021 0356 03/21/2021 0647 03/19/2021 0947 03/20/2021 1154 03/30/2021 1819 03/18/2021 2151 04/10/21 0222  NA 135   < >  --  134* 128* 134* 124* 133*  K >7.5*   < >  --  6.8* 6.3* 4.3 5.2* 5.2*  CL 93*  --   --  89*  --  93* 85* 93*  CO2 16*  --   --  23  --  24 22 23   GLUCOSE 128*  --   --  141*  --  109* 112* 113*  BUN 114*  --   --  119*  --  67* 66* 77*  CREATININE 14.79*  --   --  14.58*  --  9.52* 9.02* 10.79*  CALCIUM 10.4*  --   --  8.7*  --  8.7* 7.7* 8.2*  MG 3.1*  --  2.7*  --   --   --   --   --    < > = values in this interval not displayed.    GFR: Estimated Creatinine Clearance: 8.7 mL/min (A) (by C-G formula based on SCr of 10.79 mg/dL (H)). Recent Labs  Lab 03/29/2021 0122 03/29/2021 0312 04/03/2021 0647 04/10/21 0222  WBC 10.7*  --  11.7* 10.2  LATICACIDVEN  --  7.2*  --   --      Liver Function Tests: Recent Labs  Lab 03/29/2021 0312  AST 31  ALT 21  ALKPHOS 107  BILITOT 0.9  PROT 7.6  ALBUMIN 3.7    No results for input(s): LIPASE, AMYLASE in the last 168 hours. Recent Labs  Lab 03/26/2021 0312  AMMONIA 44*     ABG    Component Value Date/Time   PHART 7.454 (H) 04/08/2021 1154   PCO2ART 29.7 (L) 03/24/2021 1154   PO2ART 168 (H) 03/18/2021 1154   HCO3 20.9 03/31/2021 1154   TCO2 22 04/06/2021 1154   ACIDBASEDEF 2.0 04/12/2021 1154   O2SAT 100.0 04/08/2021 1154      Coagulation Profile: No results for input(s): INR, PROTIME in the last 168 hours.  Cardiac Enzymes: No results for input(s): CKTOTAL, CKMB, CKMBINDEX, TROPONINI in the last 168 hours.  HbA1C: Hgb A1c MFr Bld  Date/Time Value Ref Range Status  12/19/2020 04:59 AM 5.3 4.8 - 5.6 % Final    Comment:    (NOTE) Pre diabetes:          5.7%-6.4%  Diabetes:              >6.4%  Glycemic control for   <7.0% adults with diabetes   03/27/2020 02:55 AM 5.1 4.8 - 5.6 % Final    Comment:    (NOTE) Pre diabetes:          5.7%-6.4%  Diabetes:               >6.4%  Glycemic control for   <  7.0% adults with diabetes     CBG: Recent Labs  Lab 03/28/2021 1524 04/02/2021 1908 04/01/2021 2312 04/10/21 0319 04/10/21 0711  GLUCAP 85 100* 110* 96 104*    Critical care time: 40 minutes    Freda Jackson, MD [facility] Office: [phone]   See Amion for personal pager PCCM on call pager [phone] until 7pm. Please call Elink 7p-7a. [phone]

## 2021-04-10 NOTE — ED Provider Notes (Signed)
Emergency Department Provider Note   I have reviewed the triage vital signs and the nursing notes.   HISTORY  Chief Complaint Cardiac Arrest   HPI Jeremy Mccoy is a 55 y.o. male with end-stage renal disease on dialysis who presents emergency department today unresponsive after receiving CPR.  Apparently patient was in his normal state of health when his brother came back from the other room and witnessed him collapse.  He cannot arouse and called 911 and then started CPR within 2 minutes.  EMS was on scene within 5 minutes patient was in PEA.  They gave 2 doses of epinephrine, 2 doses of calcium and and bicarb, inserted LMA and IV and brought here for further evaluation. LEVEL V CAVEAT APPLIES SECONDARY TO unresponsive   Past Medical History:  Diagnosis Date   Anemia    Anxiety    Arthritis    "qwhere" (05/15/2013)   CHF (congestive heart failure) (HCC)    COPD (chronic obstructive pulmonary disease) (HCC)    Crack cocaine use    Dental caries    Depression    ESRD (end stage renal disease) on dialysis ([place])    TTS: [address] [place] (05/15/2013)   Headache(784.0)    "get it from going to dialysis; when they get real bad I come to hospital" (05/15/2013)   Hematemesis/vomiting blood    History of blood transfusion 10/2011; 04/2013   HTN (hypertension) 06/12/2012   Seizures ([place])    Shortness of breath    "just when I have too much potassium is too high" (05/15/2013)    Patient Active Problem List   Diagnosis Date Noted   Syncope 03/17/2021   Cardiac arrest ([place]) 04/14/2021   Acute hypercapnic respiratory failure ([place]) 04/02/2021   Myoclonus 04/07/2021   Anoxic encephalopathy ([place]) 03/21/2021   Elevated lactic acid level 04/08/2021   Secondary hypercoagulable state ([place]) 02/04/2021   Dysphagia    Palpitations    HFrEF (heart failure with reduced ejection fraction) (HCC)    Nonspecific chest pain    Atrial fibrillation with RVR ([place]) 03/25/2020   Acute  respiratory failure due to COVID-19 ([place]) 02/24/2020   Gastrointestinal hemorrhage, unspecified 02/24/2020   Allergy, unspecified, initial encounter 01/31/2020   Anaphylactic shock, unspecified, initial encounter 01/31/2020   Pleural effusion, not elsewhere classified 01/23/2020   Hyperkalemia, diminished renal excretion 01/20/2020   Plantar fasciitis, bilateral 09/12/2017   Headache, unspecified 05/24/2017   Fever, unspecified 10/05/2016   Hyperkalemia 02/23/2016   Vasculitis ([place])    Hemoptysis 07/25/2015   Encounter for removal of sutures 07/09/2015   Dependence on renal dialysis ([place]) 08/01/2014   Encounter for fitting and adjustment of extracorporeal dialysis catheter ([place]) 08/01/2014   Pruritus, unspecified 06/27/2014   Paroxysmal atrial fibrillation ([place]) 01/31/2014   Chronic diastolic congestive heart failure ([place]) 01/31/2014   COPD (chronic obstructive pulmonary disease) ([place]) 01/31/2014   Thrombocytopenia ([place]) 01/23/2014   Unspecified constipation 01/23/2014   High anion gap metabolic acidosis 38/46/6599   ESRD on hemodialysis ([place]) 11/05/2013   Seizure disorder ([place]) 11/04/2013   Protein-calorie malnutrition, severe ([place]) 07/12/2013   Respiratory failure ([place]) 07/10/2013   Status epilepticus ([address]) 07/10/2013   Hypertension 07/10/2013   Hematemesis 05/15/2013   Pruritus ani 04/27/2013   Other specified symptoms and signs involving the circulatory and respiratory systems 04/22/2013   Abnormal EKG 09/20/2012   Drug abuse ([place]) 09/20/2012   Elevated troponin 09/20/2012   Anemia in chronic kidney disease 09/20/2012   Cocaine abuse ([place]) 06/12/2012  ESRD (end stage renal disease) ([place]) 01/11/2012   Iron deficiency anemia, unspecified 12/02/2011   Secondary hyperparathyroidism of renal origin ([place]) 12/02/2011   Tobacco abuse 11/04/2011   Dental caries 11/04/2011   Color blindness 11/04/2011    Past Surgical History:  Procedure Laterality Date   AV FISTULA PLACEMENT   11/29/2011   Procedure: ARTERIOVENOUS (AV) FISTULA CREATION;  Surgeon: Angelia Mould, MD;  Location: [facility];  Service: Vascular;  Laterality: Left;   CARDIOVERSION N/A 03/31/2020   Procedure: CARDIOVERSION;  Surgeon: Lelon Perla, MD;  Location: [facility] ENDOSCOPY;  Service: Cardiovascular;  Laterality: N/A;   ESOPHAGOGASTRODUODENOSCOPY N/A 05/17/2013   Procedure: ESOPHAGOGASTRODUODENOSCOPY (EGD);  Surgeon: Beryle Beams, MD;  Location: [facility] ENDOSCOPY;  Service: Endoscopy;  Laterality: N/A;   INGUINAL HERNIA REPAIR Bilateral 1967   IR FLUORO GUIDE CV LINE RIGHT  10/20/2017   IR THROMBECTOMY AV FISTULA W/THROMBOLYSIS/PTA INC/SHUNT/IMG LEFT Left 10/24/2017   IR US GUIDE VASC ACCESS LEFT  10/24/2017   IR US GUIDE VASC ACCESS RIGHT  10/20/2017   RENAL BIOPSY  11/07/2011   TEE WITHOUT CARDIOVERSION N/A 03/31/2020   Procedure: TRANSESOPHAGEAL ECHOCARDIOGRAM (TEE);  Surgeon: Lelon Perla, MD;  Location: [facility] ENDOSCOPY;  Service: Cardiovascular;  Laterality: N/A;      Allergies Patient has no known allergies.  Family History  Problem Relation Age of Onset   Heart attack Father     Social History Social History   Tobacco Use   Smoking status: Every Day    Packs/day: 1.00    Years: 50.00    Pack years: 50.00    Types: Cigars, Cigarettes   Smokeless tobacco: Never   Tobacco comments:    1 pack daily (02/04/2021)  Vaping Use   Vaping Use: Never used  Substance Use Topics   Alcohol use: No    Comment: 05/15/2013 "aien't drank in ~ 25 yrs"   Drug use: Yes    Types: Marijuana, Cocaine    Comment: Precription drugs (e.g. percocet) "I take what I have to to controll my constant pain" (05/15/2013)    Review of Systems  LEVEL V CAVEAT APPLIES SECONDARY TO unresponsive ____________________________________________  PHYSICAL EXAM:  VITAL SIGNS: ED Triage Vitals  Enc Vitals Group     BP 04/06/2021 0127 (!) 162/74     Pulse Rate 04/08/2021 0127 86     Resp 03/21/2021 0127 (!) 7     Temp  03/24/2021 0127 (!) 95.4 F (35.2 C)     Temp Source 03/19/2021 0127 Temporal     SpO2 03/17/2021 0205 100 %     Weight 03/16/2021 0500 153 lb 7 oz (69.6 kg)     Height 04/06/2021 0100 _0  (1.854 m)    Constitutional: Well appearing. Well nourished. Eyes: Conjunctivae are normal. Equal 46m Pupils.  Head: Atraumatic. Nose: No congestion/rhinnorhea. Mouth/Throat: Mucous membranes are moist.  Oropharynx non-erythematous. Neck: No stridor.  No meningeal signs.   Cardiovascular: tachycardic rate,   Respiratory: normal respiratory effort.  Lungs rhonchorous with crackles Gastrointestinal: Soft and nontender. No distention.  Musculoskeletal: No lower extremity tenderness nor edema. No gross deformities of extremities. Neurologic:  Not able to assess 2/2 condition.  Skin:   No rash noted. Psych: Not able to assess 2/2 condition.   ____________________________________________   LABS (all labs ordered are listed, but only abnormal results are displayed)  Labs Reviewed  AMMONIA - Abnormal; Notable for the following components:      Result Value   Ammonia 44 (*)  All other components within normal limits  COMPREHENSIVE METABOLIC PANEL - Abnormal; Notable for the following components:   Potassium >7.5 (*)    Chloride 93 (*)    CO2 16 (*)    Glucose, Bld 128 (*)    BUN 114 (*)    Creatinine, Ser 14.79 (*)    Calcium 10.4 (*)    GFR, Estimated 4 (*)    Anion gap 26 (*)    All other components within normal limits  LACTIC ACID, PLASMA - Abnormal; Notable for the following components:   Lactic Acid, Venous 7.2 (*)    All other components within normal limits  CBC WITH DIFFERENTIAL/PLATELET - Abnormal; Notable for the following components:   WBC 10.7 (*)    Hemoglobin 12.5 (*)    MCV 100.2 (*)    MCHC 29.1 (*)    RDW 18.3 (*)    Abs Immature Granulocytes 0.23 (*)    All other components within normal limits  MAGNESIUM - Abnormal; Notable for the following components:   Magnesium 3.1  (*)    All other components within normal limits  CBC - Abnormal; Notable for the following components:   WBC 11.7 (*)    RBC 3.90 (*)    Hemoglobin 11.6 (*)    HCT 36.9 (*)    RDW 18.0 (*)    All other components within normal limits  MAGNESIUM - Abnormal; Notable for the following components:   Magnesium 2.7 (*)    All other components within normal limits  BASIC METABOLIC PANEL - Abnormal; Notable for the following components:   Sodium 134 (*)    Potassium 6.8 (*)    Chloride 89 (*)    Glucose, Bld 141 (*)    BUN 119 (*)    Creatinine, Ser 14.58 (*)    Calcium 8.7 (*)    GFR, Estimated 4 (*)    Anion gap 22 (*)    All other components within normal limits  BASIC METABOLIC PANEL - Abnormal; Notable for the following components:   Sodium 134 (*)    Chloride 93 (*)    Glucose, Bld 109 (*)    BUN 67 (*)    Creatinine, Ser 9.52 (*)    Calcium 8.7 (*)    GFR, Estimated 6 (*)    Anion gap 17 (*)    All other components within normal limits  BASIC METABOLIC PANEL - Abnormal; Notable for the following components:   Sodium 124 (*)    Potassium 5.2 (*)    Chloride 85 (*)    Glucose, Bld 112 (*)    BUN 66 (*)    Creatinine, Ser 9.02 (*)    Calcium 7.7 (*)    GFR, Estimated 6 (*)    Anion gap 17 (*)    All other components within normal limits  GLUCOSE, CAPILLARY - Abnormal; Notable for the following components:   Glucose-Capillary 159 (*)    All other components within normal limits  GLUCOSE, CAPILLARY - Abnormal; Notable for the following components:   Glucose-Capillary 136 (*)    All other components within normal limits  HEPARIN LEVEL (UNFRACTIONATED) - Abnormal; Notable for the following components:   Heparin Unfractionated 0.92 (*)    All other components within normal limits  APTT - Abnormal; Notable for the following components:   aPTT 46 (*)    All other components within normal limits  BASIC METABOLIC PANEL - Abnormal; Notable for the following components:    Sodium 133 (*)  Potassium 5.2 (*)    Chloride 93 (*)    Glucose, Bld 113 (*)    BUN 77 (*)    Creatinine, Ser 10.79 (*)    Calcium 8.2 (*)    GFR, Estimated 5 (*)    Anion gap 17 (*)    All other components within normal limits  GLUCOSE, CAPILLARY - Abnormal; Notable for the following components:   Glucose-Capillary 100 (*)    All other components within normal limits  CBC - Abnormal; Notable for the following components:   RBC 3.49 (*)    Hemoglobin 10.4 (*)    HCT 32.0 (*)    RDW 18.4 (*)    Platelets 126 (*)    All other components within normal limits  APTT - Abnormal; Notable for the following components:   aPTT 49 (*)    All other components within normal limits  GLUCOSE, CAPILLARY - Abnormal; Notable for the following components:   Glucose-Capillary 110 (*)    All other components within normal limits  APTT - Abnormal; Notable for the following components:   aPTT 58 (*)    All other components within normal limits  GLUCOSE, CAPILLARY - Abnormal; Notable for the following components:   Glucose-Capillary 104 (*)    All other components within normal limits  LACTIC ACID, PLASMA - Abnormal; Notable for the following components:   Lactic Acid, Venous 2.0 (*)    All other components within normal limits  BASIC METABOLIC PANEL - Abnormal; Notable for the following components:   Sodium 132 (*)    Chloride 92 (*)    CO2 20 (*)    Glucose, Bld 120 (*)    BUN 82 (*)    Creatinine, Ser 11.05 (*)    Calcium 7.7 (*)    GFR, Estimated 5 (*)    Anion gap 20 (*)    All other components within normal limits  BASIC METABOLIC PANEL - Abnormal; Notable for the following components:   Sodium 131 (*)    Chloride 91 (*)    CO2 21 (*)    Glucose, Bld 120 (*)    BUN 90 (*)    Creatinine, Ser 11.50 (*)    Calcium 8.1 (*)    GFR, Estimated 5 (*)    Anion gap 19 (*)    All other components within normal limits  GLUCOSE, CAPILLARY - Abnormal; Notable for the following components:    Glucose-Capillary 110 (*)    All other components within normal limits  GLUCOSE, CAPILLARY - Abnormal; Notable for the following components:   Glucose-Capillary 116 (*)    All other components within normal limits  GLUCOSE, CAPILLARY - Abnormal; Notable for the following components:   Glucose-Capillary 108 (*)    All other components within normal limits  GLUCOSE, CAPILLARY - Abnormal; Notable for the following components:   Glucose-Capillary 104 (*)    All other components within normal limits  I-STAT CHEM 8, ED - Abnormal; Notable for the following components:   Sodium 133 (*)    Potassium 6.4 (*)    BUN >130 (*)    Creatinine, Ser 14.80 (*)    Glucose, Bld 150 (*)    TCO2 18 (*)    All other components within normal limits  CBG MONITORING, ED - Abnormal; Notable for the following components:   Glucose-Capillary 181 (*)    All other components within normal limits  I-STAT VENOUS BLOOD GAS, ED - Abnormal; Notable for the following components:   pH,  Ven 6.954 (*)    pCO2, Ven 62.1 (*)    pO2, Ven 99.0 (*)    Bicarbonate 13.8 (*)    TCO2 16 (*)    Acid-base deficit 19.0 (*)    Sodium 132 (*)    Potassium 6.5 (*)    All other components within normal limits  I-STAT ARTERIAL BLOOD GAS, ED - Abnormal; Notable for the following components:   pH, Arterial 6.999 (*)    pCO2 arterial 62.8 (*)    pO2, Arterial 489 (*)    Bicarbonate 15.5 (*)    TCO2 17 (*)    Acid-base deficit 16.0 (*)    Sodium 131 (*)    Potassium 6.7 (*)    Calcium, Ion 1.47 (*)    All other components within normal limits  I-STAT ARTERIAL BLOOD GAS, ED - Abnormal; Notable for the following components:   pH, Arterial 7.064 (*)    pCO2 arterial 61.7 (*)    pO2, Arterial 512 (*)    Bicarbonate 18.1 (*)    TCO2 20 (*)    Acid-base deficit 13.0 (*)    Sodium 132 (*)    Potassium 7.1 (*)    All other components within normal limits  I-STAT ARTERIAL BLOOD GAS, ED - Abnormal; Notable for the following  components:   pH, Arterial 7.139 (*)    pCO2 arterial 50.2 (*)    pO2, Arterial 141 (*)    Bicarbonate 17.1 (*)    TCO2 19 (*)    Acid-base deficit 12.0 (*)    Sodium 131 (*)    Potassium 7.5 (*)    All other components within normal limits  I-STAT ARTERIAL BLOOD GAS, ED - Abnormal; Notable for the following components:   pH, Arterial 7.233 (*)    pO2, Arterial 154 (*)    Bicarbonate 16.5 (*)    TCO2 18 (*)    Acid-base deficit 10.0 (*)    Sodium 129 (*)    Potassium 7.2 (*)    All other components within normal limits  I-STAT ARTERIAL BLOOD GAS, ED - Abnormal; Notable for the following components:   pH, Arterial 7.270 (*)    pO2, Arterial 207 (*)    Bicarbonate 19.0 (*)    TCO2 20 (*)    Acid-base deficit 8.0 (*)    Sodium 131 (*)    Potassium 6.3 (*)    Calcium, Ion 1.14 (*)    HCT 38.0 (*)    Hemoglobin 12.9 (*)    All other components within normal limits  POCT I-STAT 7, (LYTES, BLD GAS, ICA,H+H) - Abnormal; Notable for the following components:   pH, Arterial 7.454 (*)    pCO2 arterial 29.7 (*)    pO2, Arterial 168 (*)    Sodium 128 (*)    Potassium 6.3 (*)    Calcium, Ion 0.99 (*)    HCT 33.0 (*)    Hemoglobin 11.2 (*)    All other components within normal limits  POCT I-STAT 7, (LYTES, BLD GAS, ICA,H+H) - Abnormal; Notable for the following components:   pO2, Arterial 129 (*)    Sodium 130 (*)    Calcium, Ion 1.03 (*)    HCT 29.0 (*)    Hemoglobin 9.9 (*)    All other components within normal limits  POCT I-STAT 7, (LYTES, BLD GAS, ICA,H+H) - Abnormal; Notable for the following components:   pH, Arterial 7.330 (*)    pO2, Arterial 119 (*)    Sodium 131 (*)  Calcium, Ion 1.09 (*)    HCT 31.0 (*)    Hemoglobin 10.5 (*)    All other components within normal limits  POCT I-STAT 7, (LYTES, BLD GAS, ICA,H+H) - Abnormal; Notable for the following components:   pH, Arterial 7.338 (*)    pO2, Arterial 118 (*)    Acid-base deficit 3.0 (*)    Sodium 129 (*)     Calcium, Ion 1.02 (*)    HCT 29.0 (*)    Hemoglobin 9.9 (*)    All other components within normal limits  TROPONIN I (HIGH SENSITIVITY) - Abnormal; Notable for the following components:   Troponin I (High Sensitivity) 1,531 (*)    All other components within normal limits  TROPONIN I (HIGH SENSITIVITY) - Abnormal; Notable for the following components:   Troponin I (High Sensitivity) 1,687 (*)    All other components within normal limits  TROPONIN I (HIGH SENSITIVITY) - Abnormal; Notable for the following components:   Troponin I (High Sensitivity) 2,372 (*)    All other components within normal limits  TROPONIN I (HIGH SENSITIVITY) - Abnormal; Notable for the following components:   Troponin I (High Sensitivity) 2,160 (*)    All other components within normal limits  TROPONIN I (HIGH SENSITIVITY) - Abnormal; Notable for the following components:   Troponin I (High Sensitivity) 1,851 (*)    All other components within normal limits  CULTURE, BLOOD (ROUTINE X 2)  CULTURE, BLOOD (ROUTINE X 2)  RESP PANEL BY RT-PCR (FLU A&B, COVID) ARPGX2  MRSA NEXT GEN BY PCR, NASAL  ETHANOL  HIV ANTIBODY (ROUTINE TESTING W REFLEX)  GLUCOSE, CAPILLARY  GLUCOSE, CAPILLARY  TRIGLYCERIDES  HEPARIN LEVEL (UNFRACTIONATED)  GLUCOSE, CAPILLARY  LACTIC ACID, PLASMA  RAPID URINE DRUG SCREEN, HOSP PERFORMED  URINALYSIS, ROUTINE W REFLEX MICROSCOPIC  BLOOD GAS, ARTERIAL  BLOOD GAS, ARTERIAL  BLOOD GAS, ARTERIAL  BLOOD GAS, ARTERIAL  DRUG SCREEN 10 W/CONF, SERUM  BLOOD GAS, ARTERIAL  BASIC METABOLIC PANEL  BLOOD GAS, ARTERIAL  APTT  BASIC METABOLIC PANEL  CBC  HEPARIN LEVEL (UNFRACTIONATED)  APTT  BLOOD GAS, ARTERIAL  BASIC METABOLIC PANEL   ____________________________________________  EKG   EKG Interpretation  Date/Time:  Friday April 09 2021 01:32:50 EST Ventricular Rate:  96 PR Interval:  212 QRS Duration: 148 QT Interval:  408 QTC Calculation: 515 R Axis:   -74 Text  Interpretation: Sinus rhythm with 1st degree A-V block Possible Left atrial enlargement Left axis deviation Left ventricular hypertrophy with QRS widening ( Cornell product ) Inferior infarct , age undetermined Cannot rule out Anterior infarct , age undetermined Abnormal ECG Confirmed by Merrily Pew [phone]) on 04/12/2021 1:39:50 AM        ____________________________________________  RADIOLOGY  DG Chest Port 1 View  Result Date: 04/10/2021 CLINICAL DATA:  Difficulty breathing EXAM: PORTABLE CHEST 1 VIEW COMPARISON:  Previous studies including the examination of 04/01/2021 FINDINGS: Tip of endotracheal tube is 6.5 cm above the carina. Enteric tube is noted in the course of esophagus. Transverse diameter of heart is increased. Central pulmonary vessels are prominent. Diffuse increase in interstitial markings are noted throughout both lungs. There is possible worsening of this finding both lungs, especially in the lower lung fields. Costophrenic angles are clear. There is no pneumothorax. IMPRESSION: There is diffuse increase in interstitial markings in both lungs with possible worsening suggesting interstitial pneumonia or interstitial edema. Part of this finding may suggest underlying scarring. Cardiomegaly. Central pulmonary vessels are prominent. There is no significant pleural effusion  or pneumothorax. Electronically Signed   By: Elmer Picker M.D.   On: 04/10/2021 08:11   Overnight EEG with video  Result Date: 04/10/2021 Lora Havens, MD     04/10/2021  6:24 PM Patient Name: Jeremy Mccoy MRN: [id number] Epilepsy Attending: Lora Havens Referring Physician/Provider: Dr Farrel Gordon Duration: 03/19/2021 1205 to 04/10/2021 1205  Patient history: 45 old male status post cardiac arrest.  EEG evaluate for seizures.  Level of alertness:  comatose  AEDs during EEG study: Propofol, versed, LEV  Technical aspects: This EEG study was done with scalp electrodes positioned according to the  10-20 International system of electrode placement. Electrical activity was acquired at a sampling rate of _0  and reviewed with a high frequency filter of _1  and a low frequency filter of _2 . EEG data were recorded continuously and digitally stored.  Description: At the beginning of study, patient was noted to have near continuous episodes of eye opening as well as bilateral upper extremity jerking.  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 Between seizures, EEG showed generalized background suppression. EEG was not reactive to stimulation.  Gradually as propofol was increased after around 1630 on 03/20/2021, myoclonic seizures became less frequent ( every few seconds). However EEG continued to show highly epileptiform bursts lasting 1-2 seconds alternating with eeg suppression lasting 2-5 seconds. After around 2030, eeg showed generalized periodic epileptifom discharges at _3  alternating with eeg suppression lasting 1 seconds. Patient event button was pressed on 04/10/2021 at 0544. Per RN patient had head tremor Concomitant eeg showed generalized periodic epileptifom discharges at _4  alternating with eeg suppression lasting 1 seconds.  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 As propofol and versed was increased, myoclonic seizures became less frequent and eventually stopped. However, EEG continues to show evidence of epileptogenicity with generalized onset as well as profound diffuse encephalopathy. This eeg pattern is on the ictal-interictal continuum with high potential for seizure recurrence. With history of cardiac arrest, this EEG is most likely due to anoxic/hypoxic brain injury. Patient event button was  pressed on 04/10/2021 at 0544. Per RN patient had head tremor without definite concomitant eeg change to suggest seizure. This was most likely not epileptic.   Lora Havens   ECHOCARDIOGRAM COMPLETE  Result Date: 04/10/2021    ECHOCARDIOGRAM REPORT   Patient Name:   DAMONTAY ALRED [facility] Date of Exam: 04/10/2021 Medical Rec #:  [id number]        Height:       73.0 in Accession #:    [id number]       Weight:       177.2 lb Date of Birth:  06-03-1965        BSA:          2.044 m Patient Age:    55 years         BP:           99/88 mmHg Patient Gender: M                HR:           104 bpm. Exam Location:  Inpatient Procedure: 2D Echo, Cardiac Doppler and Color Doppler Indications:     Acute MI  History:         Patient has prior history of Echocardiogram examinations,  most                  recent 12/19/2020. COPD, Signs/Symptoms:Syncope; Risk                  Factors:Hypertension.  Sonographer:     Glo Herring Referring Phys:  [id number] Renee Pain Diagnosing Phys: Oswaldo Milian MD IMPRESSIONS  1. Left ventricular ejection fraction, by estimation, is 60 to 65%. The left ventricle has normal function. The left ventricle has no regional wall motion abnormalities. There is mild left ventricular hypertrophy. Left ventricular diastolic parameters are indeterminate.  2. Right ventricular systolic function is mildly reduced. The right ventricular size is mildly enlarged. There is moderately elevated pulmonary artery systolic pressure. The estimated right ventricular systolic pressure is 85.6 mmHg.  3. Left atrial size was moderately dilated.  4. The mitral valve is abnormal. No evidence of mitral valve regurgitation. Severe mitral stenosis. MVA 1.4 cm^2 by continuity equation. MG 12 mmHg at 104 bpm. Severe mitral annular calcification.  5. The aortic valve is tricuspid. Aortic valve regurgitation is not visualized. Aortic valve sclerosis is present, with no evidence of aortic valve stenosis.  6. The inferior  vena cava is dilated in size with <50% respiratory variability, suggesting right atrial pressure of 15 mmHg. FINDINGS  Left Ventricle: Left ventricular ejection fraction, by estimation, is 60 to 65%. The left ventricle has normal function. The left ventricle has no regional wall motion abnormalities. The left ventricular internal cavity size was normal in size. There is  mild left ventricular hypertrophy. Left ventricular diastolic parameters are indeterminate. Right Ventricle: The right ventricular size is mildly enlarged. Right vetricular wall thickness was not well visualized. Right ventricular systolic function is mildly reduced. There is moderately elevated pulmonary artery systolic pressure. The tricuspid  regurgitant velocity is 2.98 m/s, and with an assumed right atrial pressure of 15 mmHg, the estimated right ventricular systolic pressure is 31.4 mmHg. Left Atrium: Left atrial size was moderately dilated. Right Atrium: Right atrial size was normal in size. Pericardium: There is no evidence of pericardial effusion. Mitral Valve: The mitral valve is abnormal. Severe mitral annular calcification. No evidence of mitral valve regurgitation. Severe mitral valve stenosis. MV peak gradient, 16.9 mmHg. The mean mitral valve gradient is 10.5 mmHg. Tricuspid Valve: The tricuspid valve is normal in structure. Tricuspid valve regurgitation is mild. Aortic Valve: The aortic valve is tricuspid. Aortic valve regurgitation is not visualized. Aortic valve sclerosis is present, with no evidence of aortic valve stenosis. Aortic valve mean gradient measures 7.0 mmHg. Aortic valve peak gradient measures 11.7 mmHg. Aortic valve area, by VTI measures 2.29 cm. Pulmonic Valve: The pulmonic valve was grossly normal. Pulmonic valve regurgitation is not visualized. Aorta: The aortic root and ascending aorta are structurally normal, with no evidence of dilitation. Venous: The inferior vena cava is dilated in size with less than 50%  respiratory variability, suggesting right atrial pressure of 15 mmHg. IAS/Shunts: The interatrial septum was not well visualized.  LEFT VENTRICLE PLAX 2D LVIDd:         4.30 cm LVIDs:         2.80 cm LV PW:         1.30 cm LV IVS:        1.30 cm LVOT diam:     2.00 cm LV SV:         63 LV SV Index:   31 LVOT Area:     3.14 cm  LV Volumes (MOD) LV vol  d, MOD A2C: 126.0 ml LV vol d, MOD A4C: 137.0 ml LV vol s, MOD A2C: 55.6 ml LV vol s, MOD A4C: 54.6 ml LV SV MOD A2C:     70.4 ml LV SV MOD A4C:     137.0 ml LV SV MOD BP:      75.5 ml RIGHT VENTRICLE            IVC RV Basal diam:  4.00 cm    IVC diam: 2.30 cm RV S prime:     9.36 cm/s LEFT ATRIUM           Index        RIGHT ATRIUM           Index LA diam:      4.80 cm 2.35 cm/m   RA Area:     18.70 cm LA Vol (A4C): 90.9 ml 44.47 ml/m  RA Volume:   48.60 ml  23.78 ml/m  AORTIC VALVE                     PULMONIC VALVE AV Area (Vmax):    2.22 cm      PV Vmax:       1.06 m/s AV Area (Vmean):   2.13 cm      PV Peak grad:  4.5 mmHg AV Area (VTI):     2.29 cm AV Vmax:           171.00 cm/s AV Vmean:          126.000 cm/s AV VTI:            0.274 m AV Peak Grad:      11.7 mmHg AV Mean Grad:      7.0 mmHg LVOT Vmax:         121.00 cm/s LVOT Vmean:        85.300 cm/s LVOT VTI:          0.200 m LVOT/AV VTI ratio: 0.73  AORTA Ao Root diam: 3.20 cm Ao Asc diam:  2.60 cm MITRAL VALVE              TRICUSPID VALVE MV Area VTI:  1.41 cm    TR Peak grad:   35.5 mmHg MV Peak grad: 16.9 mmHg   TR Vmax:        298.00 cm/s MV Mean grad: 10.5 mmHg MV Vmax:      2.06 m/s    SHUNTS MV Vmean:     154.5 cm/s  Systemic VTI:  0.20 m                           Systemic Diam: 2.00 cm Oswaldo Milian MD Electronically signed by Oswaldo Milian MD Signature Date/Time: 04/10/2021/4:46:30 PM    Final (Updated)    ____________________________________________   PROCEDURES  Procedure(s) performed:   .Critical Care Performed by: Merrily Pew, MD Authorized by: Merrily Pew, MD    Critical care provider statement:    Critical care time (minutes):  32   Critical care time was exclusive of:  Separately billable procedures and treating other patients and teaching time   Critical care was necessary to treat or prevent imminent or life-threatening deterioration of the following conditions:  Shock, cardiac failure and respiratory failure   Critical care was time spent personally by me on the following activities:  Development of treatment plan with patient or surrogate, evaluation of patient's response to treatment, examination of  patient, obtaining history from patient or surrogate, review of old charts, re-evaluation of patient's condition, pulse oximetry, ordering and performing treatments and interventions and ordering and review of laboratory studies Procedure Name: Intubation Date/Time: 04/10/2021 11:25 PM Performed by: Merrily Pew, MD Pre-anesthesia Checklist: Patient identified, Patient being monitored, Emergency Drugs available, Timeout performed and Suction available Oxygen Delivery Method: Non-rebreather mask Preoxygenation: Pre-oxygenation with 100% oxygen Induction Type: Rapid sequence Ventilation: Mask ventilation without difficulty Laryngoscope Size: Glidescope and 3 Grade View: Grade II Tube size: 7.5 mm Number of attempts: 1 Placement Confirmation: ETT inserted through vocal cords under direct vision, CO2 detector and Breath sounds checked- equal and bilateral Secured at: 23 cm Tube secured with: ETT holder Dental Injury: Teeth and Oropharynx as per pre-operative assessment  Future Recommendations: Recommend- induction with short-acting agent, and alternative techniques readily available Comments: Blood on lip present prior to my intubation    ____________________________________________   INITIAL IMPRESSION / ASSESSMENT AND PLAN / ED COURSE  Pertinent labs & imaging results that were available during my care of the patient were reviewed by me  and considered in my medical decision making (see chart for details).   Patient was considered to likely have hyperkalemia as a cause for symptoms secondary to his response to calcium gluconate.  EKG here did show some peaked T waves so continue down that pathway giving more calcium, bicarb, insulin, albuterol.  Did not feel that Lasix would be much help with his end-stage renal disease.  Cultures drawn.  Discussed with nephrology who will dialyze him to get upstairs in ICU.  Discussed with ICU for admission..  ____________________________________________  FINAL CLINICAL IMPRESSION(S) / ED DIAGNOSES  Final diagnoses:  Cardiac arrest ([place])  Hyperkalemia    MEDICATIONS GIVEN DURING THIS VISIT:  Medications  calcium chloride injection (1 g Intravenous Given 04/07/2021 0134)  Chlorhexidine Gluconate Cloth 2 % PADS 6 each (6 each Topical Given 04/10/21 0430)  fentaNYL 255mg in NS 2566m(1044mml) infusion-PREMIX (0 mcg/hr Intravenous Stopped 04/03/2021 1100)  fentaNYL (SUBLIMAZE) bolus via infusion 50-100 mcg (75 mcg Intravenous Bolus from Bag 03/18/2021 0501)  docusate (COLACE) 50 MG/5ML liquid 100 mg (100 mg Per Tube Given 04/10/21 2124)  polyethylene glycol (MIRALAX / GLYCOLAX) packet 17 g (17 g Per Tube Given 04/10/21 0838)  pantoprazole (PROTONIX) injection 40 mg (40 mg Intravenous Given 04/10/21 2119)  acetaminophen (TYLENOL) tablet 650 mg (has no administration in time range)  ondansetron (ZOFRAN) injection 4 mg (has no administration in time range)  ipratropium-albuterol (DUONEB) 0.5-2.5 (3) MG/3ML nebulizer solution 3 mL (3 mLs Nebulization Given 04/10/21 1923)  albuterol (PROVENTIL) (2.5 MG/3ML) 0.083% nebulizer solution 2.5 mg (has no administration in time range)  propofol (DIPRIVAN) 1000 MG/100ML infusion (80 mcg/kg/min  69.6 kg Intravenous Infusion Verify 04/10/21 2300)  thiamine (B-1) injection 100 mg (100 mg Intravenous Given 11/70/35/0039381folic acid injection 1 mg (1 mg  Intravenous Given 04/10/21 0838)  heparin ADULT infusion 100 units/mL (25000 units/250m108m2,000 Units/hr Intravenous Infusion Verify 04/10/21 2300)  budesonide (PULMICORT) nebulizer solution 0.5 mg (0.5 mg Nebulization Given 04/10/21 1923)  chlorhexidine gluconate (MEDLINE KIT) (PERIDEX) 0.12 % solution 15 mL (15 mLs Mouth Rinse Given 04/10/21 1955)  MEDLINE mouth rinse (15 mLs Mouth Rinse Given 04/10/21 2124)  calcium chloride 10 % injection (0 g  Hold 03/25/2021 1311)  sodium bicarbonate 1 mEq/mL injection (0 mEq  Hold 04/05/2021 1310)  feeding supplement (PROSource TF) liquid 45 mL (45 mLs Per Tube Given 04/10/21 2123)  multivitamin (RENA-VIT) tablet 1  tablet (1 tablet Per Tube Given 04/10/21 2123)  feeding supplement (NEPRO CARB STEADY) liquid 1,000 mL (1,000 mLs Per Tube New Bag/Given 04/10/21 1603)  levETIRAcetam (KEPPRA) IVPB 500 mg/100 mL premix (0 mg Intravenous Stopped 04/10/21 2137)  midazolam (VERSED) 100 mg/100 mL (1 mg/mL) premix infusion (0 mg/hr Intravenous Stopped 03/19/2021 2355)  midazolam (VERSED) 250 mg in sodium chloride 0.9 % 250 mL (1 mg/mL) infusion (60 mg/hr Intravenous Infusion Verify 04/10/21 2300)  0.9 %  sodium chloride infusion (0 mLs Intravenous Hold 04/10/21 0953)  norepinephrine (LEVOPHED) 54m in 2565mpremix infusion (3 mcg/min Intravenous Infusion Verify 04/10/21 2300)  sodium zirconium cyclosilicate (LOKELMA) packet 10 g (10 g Per Tube Given 04/10/21 2123)  sodium zirconium cyclosilicate (LOKELMA) packet 10 g (10 g Per Tube Given 03/28/2021 0354)  sodium bicarbonate 150 mEq in dextrose 5 % 1,150 mL infusion ( Intravenous Stopped 03/19/2021 1100)  albuterol (PROVENTIL) (2.5 MG/3ML) 0.083% nebulizer solution 10 mg (10 mg Nebulization Given 03/20/2021 0311)  dextrose 10 % infusion (0 mLs Intravenous Stopped 03/17/2021 1313)  insulin aspart (novoLOG) injection 10 Units (10 Units Intravenous Given 03/25/2021 0355)    And  dextrose 50 % solution 50 mL (50 mLs Intravenous Given  03/17/2021 0355)  fentaNYL (SUBLIMAZE) injection 50 mcg (50 mcg Intravenous Given 03/21/2021 0447)  aspirin chewable tablet 324 mg ( Oral See Alternative 04/11/2021 0700)    Or  aspirin suppository 300 mg (0 mg Rectal Duplicate 1142/68/3471962 fentaNYL (SUBLIMAZE) injection 50 mcg (50 mcg Intravenous Given 03/24/2021 0536)  midazolam (VERSED) injection 4 mg (4 mg Intravenous Given 03/22/2021 0947)  levETIRAcetam (KEPPRA) IVPB 1000 mg/100 mL premix (0 mg Intravenous Stopped 03/30/2021 1158)  EPINEPHrine (ADRENALIN) 1 MG/10ML injection (  Given 03/29/2021 1115)  atropine 1 MG/10ML injection (  [facility]  sodium zirconium cyclosilicate (LOKELMA) packet 10 g (10 g Per Tube Given 04/11/2021 1143)  heparin sodium (porcine) 1000 UNIT/ML injection (  Duplicate 1122/[id number] midazolam (VERSED) injection 4 mg (4 mg Intravenous Given 04/12/2021 1536)  levETIRAcetam (KEPPRA) IVPB 1000 mg/100 mL premix (0 mg Intravenous Stopped 04/01/2021 2213)  midazolam (VERSED) bolus via infusion 10 mg (10 mg Intravenous Bolus from Bag 03/16/2021 2158)    NEW OUTPATIENT MEDICATIONS STARTED DURING THIS VISIT:  Current Discharge Medication List      Note:  This document was prepared using Dragon voice recognition software and may include unintentional dictation errors.     MeMerrily PewMD 04/10/21 [phone]

## 2021-04-10 NOTE — Progress Notes (Signed)
S: NAEON. 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 EEG currently shows generalized periodic discharges continuously at 1 Hz.  EEG 11/25-11/26/22 per Dr. Karolee Stamps report:  - Myoclonic status epilepticus, generalized - Burst suppression with highly epileptiform bursts, generalized - Periodic epileptifom discharges, generalized - EEG suppression, generalized  This study initially showed myoclonic status epilepticus with generalized onset. As propofol and versed was increased, myoclonic seizures became less frequent and eventually stopped. However, EEG continues to show evidence of epileptogenicity with generalized onset as well as profound diffuse encephalopathy. This eeg pattern is on the ictal-interictal continuum with high potential for seizure recurrence. With history of cardiac arrest, this EEG is most likely due to anoxic/hypoxic brain injury.   Patient event button was pressed on 04/10/2021 at 0544. Per RN patient had head tremor without definite concomitant eeg change to suggest seizure. This was most likely not epileptic.    O:  Vitals:   04/10/21 1745 04/10/21 1800  BP: 109/64 108/66  Pulse: (!) 103 (!) 103  Resp: (!) 24 (!) 24  Temp:    SpO2: 98% 99%   Physical Exam  Constitutional: Appears well-developed and well-nourished.  Psych: Unable to assess due to intubation eyes: No scleral injection HENT: No OP obstrucion Head: Normocephalic.  Cardiovascular: Normal rate and regular rhythm.  Respiratory: Intubated, coarse breath sounds bilaterally Skin: Warm Neuro: Comatose, PERRLA, corneal reflex absent, gag reflex absent, does not withdraw to noxious stimuli in all 4 extremities, no visible myoclonus on today's exam (on sedation)  I have reviewed the images obtained: MRI brain without contrast 04/05/2021: No acute finding. No acute infarct or anoxic pattern of ischemia. Small remote infarcts. Left mastoid opacification.      ASSESSMENT/PLAN: 52 old male status post cardiac arrest, 10 minutes to ROSC initially with myoclonic status epilepticus improved to continuous GPDs at 1 Hz after starting keppra and uptitrating versed and propofol.   Cardiac arrest Suspect anoxic/anoxic brain injury Myoclonic status epilepticus, resolved   Recommendations: - Continue sedation with versed, propofol at current rates - Continue keppra 500mg  bid (renal dosing) - Plts downtrending 160->120 today. I would consider adding VPA at this point given his EEG is improved but still highly abnormal, but would like to wait to see if plts still downtrending on AM labs, particularly given patient is on heparin gtt. If plts stabilize and EEG remains highly epileptiform at that time, can add VPA 2000mg  x1 f/b 500mg  q 8 hrs -Myoclonic status epilepticus within 24 hours cardiac arrest, EEG with suppression and not reacting to noxious stimulation or both indicative of significant neurological injury even though MRI brain does not show any evidence of anoxic/hypoxic brain injury. -For now, goal is to control seizures.  If seizures are controlled, we will leave patient on sedation for about 24 to 48 hours followed by weaning sedation.  However, if seizures recur and we will attempt to wean sedation, this again would be suggestive of significant neurological injury. -Management of rest of comorbidities per primary team     Thank you for allowing [place] to participate in the care of this patient. If you have any further questions, please contact the neurohospitalist.     CRITICAL CARE Performed by: Dr. Su Monks     Total critical care time: 35 minutes   Critical care time was exclusive of separately billable procedures and treating other patients.   Critical care was necessary to treat or prevent imminent or life-threatening deterioration.   Critical care was time  spent personally by me on the following activities: development of treatment plan  with patient and/or surrogate as well as nursing, discussions with consultants, evaluation of patient's response to treatment, examination of patient, obtaining history from patient or surrogate, ordering and performing treatments and interventions, ordering and review of laboratory studies, ordering and review of radiographic studies, pulse oximetry and re-evaluation of patient's condition.   Su Monks, MD [facility] [phone]  If 7pm- 7am, please page neurology on call as listed in [place].

## 2021-04-10 NOTE — Progress Notes (Signed)
LTM maintained now. No skin breakdown noted, impedance=good. Results pending.

## 2021-04-10 NOTE — Progress Notes (Signed)
ANTICOAGULATION CONSULT NOTE - Follow Up Consult  Pharmacy Consult for heparin Indication:  Afib and r/o ACS in setting of cardiac arrest  Labs: Recent Labs    04/12/2021 0122 03/26/2021 0134 03/19/2021 0647 04/14/2021 0839 03/29/2021 0947 03/19/2021 1154 03/28/2021 1819 03/26/2021 2151 04/10/21 0222  HGB 12.5*   < > 11.6*  --   --  11.2*  --   --  10.4*  HCT 43.0   < > 36.9*  --   --  33.0*  --   --  32.0*  PLT 217  --  161  --   --   --   --   --  126*  APTT  --   --   --   --   --   --  46*  --  49*  HEPARINUNFRC  --   --   --   --   --   --  0.92*  --   --   CREATININE  --    < >  --   --  14.58*  --  9.52* 9.02* 10.79*  TROPONINIHS  --   --  1,531* 1,687* 2,372*  --   --   --   --    < > = values in this interval not displayed.    Assessment: 55yo male subtherapeutic on heparin with very little change in PTT after heparin rate change; no infusion issues or signs of bleeding per RN.  Goal of Therapy:  aPTT 66-102 seconds   Plan:  Will increase heparin infusion by 3 units/kg/hr to 1750 units/hr and check PTT in 8 hours.    Wynona Neat, PharmD, BCPS  04/10/2021,4:56 AM

## 2021-04-10 NOTE — Progress Notes (Signed)
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, now on keppra-  MRI negative ?- got started on pressors for hypotension and had HD yesterday after noon-  3 hours on a 1 K bath-  removed 2000   K of 5.2 this AM   Objective Vital signs in last 24 hours: Vitals:   04/10/21 0630 04/10/21 0645 04/10/21 0700 04/10/21 0716  BP: 98/61 100/62 101/64   Pulse: (!) 104 (!) 104 (!) 105   Resp: (!) 24 (!) 24 (!) 24   Temp:      TempSrc:      SpO2: 99% 99% 98% 98%  Weight:      Height:       Weight change: 0.4 kg  Intake/Output Summary (Last 24 hours) at 04/10/2021 8469 Last data filed at 04/10/2021 0700 Gross per 24 hour  Intake 2800.47 ml  Output 2000 ml  Net 800.47 ml    HD at [facility] MWF 4 hours, leaving after 2.5 hours most days- AVF-  runs on 1 K bath for chronic hyperkalemia-  EDW 68-  hectorol 2 and parsabiv 15   Assessment/ Plan: Pt is a 55 y.o. yo male with ESRD , non compliance who was admitted on 04/01/2021 with s/p cardiac arrest-  felt due to hyperkalemia  Assessment/Plan: 1. S/p cardiac arrest- outside the hospital-  down for at least 10 minutes.  Now with possible evidence of anoxic brain injury with sz activity  2. ESRD -  normally MWF but is notoriously non compliant-  signs off early most treatments and has been doing for years-  went Sun and Tuesday this week but signed off-  last done yesterday in ICU.  Now that on pressors next will likely need CRRT-  no acute indications this AM but suspect might come to it with his potassium  3. Hyperkalemia-  chronic issue- runs on 1 K bath-  now catabolic with k of 5.2 hours after HD-  have started him on scheduled lokelma -  will follow- labs every 6 hours-  if continues to rise precipitously will need CRRT  3. Anemia-  not too much of a concern 4. Secondary hyperparathyroidism - calcium OK-  no treatment for now 5. HTN/volume-  volume is not an issue for him-  not wet-  hypotensive  on pressors 6.  SZ-   concerning for anoxic brain injury-  first MRI at least was negative-  cont to follow exam and neuro on board  [place]: Basic Metabolic Panel: Recent Labs  Lab 03/22/2021 1819 03/26/2021 2151 04/10/21 0222  NA 134* 124* 133*  K 4.3 5.2* 5.2*  CL 93* 85* 93*  CO2 _0 GLUCOSE 109* 112* 113*  BUN 67* 66* 77*  CREATININE 9.52* 9.02* 10.79*  CALCIUM 8.7* 7.7* 8.2*   Liver Function Tests: Recent Labs  Lab 03/31/2021 0312  AST 31  ALT 21  ALKPHOS 107  BILITOT 0.9  PROT 7.6  ALBUMIN 3.7   No results for input(s): LIPASE, AMYLASE in the last 168 hours. Recent Labs  Lab 03/30/2021 0312  AMMONIA 44*   CBC: Recent Labs  Lab 03/29/2021 0122 03/19/2021 0134 03/30/2021 0647 03/23/2021 1154 04/10/21 0222  WBC 10.7*  --  11.7*  --  10.2  NEUTROABS 7.2  --   --   --   --   HGB 12.5*   < > 11.6* 11.2* 10.4*  HCT 43.0   < >  36.9* 33.0* 32.0*  MCV 100.2*  --  94.6  --  91.7  PLT 217  --  161  --  126*   < > = values in this interval not displayed.   Cardiac Enzymes: No results for input(s): CKTOTAL, CKMB, CKMBINDEX, TROPONINI in the last 168 hours. CBG: Recent Labs  Lab 04/01/2021 1524 04/07/2021 1908 03/27/2021 2312 04/10/21 0319 04/10/21 0711  GLUCAP 85 100* 110* 96 104*    Iron Studies: No results for input(s): IRON, TIBC, TRANSFERRIN, FERRITIN in the last 72 hours. Studies/Results: DG Abdomen 1 View  Result Date: 04/07/2021 CLINICAL DATA:  EVA encounter for OG tube placement. EXAM: ABDOMEN - 1 VIEW COMPARISON:  Chest x-ray 04/01/2021, x-ray abdomen 07/10/2013 FINDINGS: Enteric tube coursing below the diaphragm with tip and side port overlying the expected region the gastric lumen. The bowel gas pattern is normal within the visualized left upper abdomen. No radio-opaque calculi or other significant radiographic abnormality are seen. IMPRESSION: Enteric tube in good position. Electronically Signed   By: Iven Finn M.D.   On:  04/01/2021 02:30   CT HEAD WO CONTRAST  Result Date: 03/25/2021 CLINICAL DATA:  Status post cardiac arrest EXAM: CT HEAD WITHOUT CONTRAST TECHNIQUE: Contiguous axial images were obtained from the base of the skull through the vertex without intravenous contrast. COMPARISON:  07/11/2013. FINDINGS: Brain: No evidence of acute infarction, hemorrhage, cerebral edema, mass, mass effect, or midline shift. Preservation of the gray-white differentiation. No hydrocephalus or extra-axial fluid collection. Vascular: No hyperdense vessel. Atherosclerotic calcifications in the intracranial carotid and vertebral arteries. Skull: Normal. Negative for fracture or focal lesion. Sinuses/Orbits: Mucous retention cyst in the right maxillary sinus. Otherwise negative. Other: The mastoid air cells are well aerated. IMPRESSION: No acute intracranial process. Please note that MRI is more sensitive for early changes related to hypoxic brain injury. Electronically Signed   By: Merilyn Baba M.D.   On: 03/23/2021 02:17   MR BRAIN WO CONTRAST  Result Date: 04/08/2021 CLINICAL DATA:  Anoxic brain injury. EXAM: MRI HEAD WITHOUT CONTRAST TECHNIQUE: Multiplanar, multiecho pulse sequences of the brain and surrounding structures were obtained without intravenous contrast. COMPARISON:  Head CT from earlier today FINDINGS: Brain: No acute restricted diffusion, focal infarction, hemorrhage, hydrocephalus, extra-axial collection or mass lesion. Brain volume is stable from 2015 with no convincing swelling on FLAIR images. Mild FLAIR hyperintensity in the cerebral white matter attributed to chronic small vessel disease given medical history. 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 Vascular: Normal flow voids and vascular enhancements. Skull and upper cervical spine: Normal marrow signal Sinuses/Orbits: Left mastoid opacification.  Nasopharyngeal fluid. IMPRESSION: 1. No acute finding. No acute infarct or anoxic  pattern of ischemia. 2. Small remote infarcts. 3. Left mastoid opacification. Electronically Signed   By: Jorje Guild M.D.   On: 03/22/2021 11:32   DG Chest Portable 1 View  Result Date: 03/29/2021 CLINICAL DATA:  ET tube placement Resp failure, cardiac arrest EXAM: PORTABLE CHEST - 1 VIEW COMPARISON:  Earlier film of the same day FINDINGS: Endotracheal tube is been advanced, tip now 4.5 cm above carina. Gastric tube remains in place extending at least as far as the stomach, tip not seen. Overlying monitoring devices. Coarse alveolar opacities most prominent in the mid lung zones as before. Possible focal airspace opacity in the left apex as previously noted. Heart size and mediastinal contours are within normal limits. Aortic Atherosclerosis (ICD10-170.0). No effusion.  No pneumothorax. Visualized bones unremarkable. IMPRESSION: 1. Support hardware  in good position 2. Mild mid lung alveolar opacities and left upper lung focal airspace density. Electronically Signed   By: Lucrezia Europe M.D.   On: 03/28/2021 07:28   DG Chest Port 1 View  Addendum Date: 04/11/2021   ADDENDUM REPORT: 04/01/2021 02:30 ADDENDUM: Prominent cardiac silhouette unchanged. Some portion due to AP portable technique. Electronically Signed   By: Iven Finn M.D.   On: 03/23/2021 02:30   Result Date: 04/08/2021 CLINICAL DATA:  CVA.  Status post CPR EXAM: PORTABLE CHEST 1 VIEW COMPARISON:  Chest x-ray 01/29/2021, CT chest 07/25/2015 FINDINGS: Endotracheal tube with tip terminating 8 cm above the carina. Enteric tube coursing below the hemidiaphragm with tip and side port collimated off view. Lines and tubes overlie the patient. The heart and mediastinal contours are within normal limits. Right lower lobe hazy airspace opacity. Right hilar prominence. Left hilar prominence. Persistent coarsened interstitial markings. Interval development of hazy airspace opacity within the left upper lobe. Similar-appearing scattered calcified  granulomas. No pleural effusion. No pneumothorax. No acute osseous abnormality. IMPRESSION: 1. Endotracheal tube with tip terminating 8 cm above the carina. Consider advancing by couple cm. 2. Enteric tube courses below hemidiaphragm. Please see separately dictated x-ray abdomen 03/24/2021. 3. Similar lung findings with interval development of a left upper lobe airspace opacity. 4. Persistent right lower lobe consolidation/airspace opacity. 5. Sequelae of prior granulomatous disease. Electronically Signed: By: Iven Finn M.D. On: 04/06/2021 02:28   EEG adult  Result Date: 03/17/2021 Lora Havens, MD     03/25/2021  1:24 PM Patient Name: Jeremy Mccoy MRN: [id number] Epilepsy Attending: Lora Havens Referring Physician/Provider: Dr Farrel Gordon Date: 04/11/2021 Duration: 21.22 mins Patient history: 84 old male status post cardiac arrest.  EEG evaluate for seizures. Level of alertness:  comatose AEDs during EEG study: Propofol, LEV Technical aspects: This EEG study was done with scalp electrodes positioned according to the 10-20 International system of electrode placement. Electrical activity was acquired at a sampling rate of _0  and reviewed with a high frequency filter of _1  and a low frequency filter of _2 . EEG data were recorded continuously and digitally stored. Description: Patient was noted to have near continuous episodes of eye opening as well as bilateral upper extremity jerking.  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  Between seizures, EEG showed generalized background suppression.  EEG was not reactive to stimulation.  Hyperventilation and photic stimulation were not performed.   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  With history of cardiac arrest,  this EEG is most likely due to anoxic/hypoxic brain injury. Dr Marlou Sa was notified Lora Havens   Medications: Infusions:  sodium chloride     feeding supplement (NEPRO CARB STEADY) 45 mL/hr at 04/10/21 0430   fentaNYL infusion INTRAVENOUS Stopped (03/27/2021 1100)   heparin 1,750 Units/hr (04/10/21 0700)   levETIRAcetam Stopped (04/03/2021 2305)   midazolam (VERSED) infusion 60 mg/hr (04/10/21 0700)   norepinephrine (LEVOPHED) Adult infusion 4 mcg/min (04/10/21 0700)   propofol (DIPRIVAN) infusion 80 mcg/kg/min (04/10/21 0700)    Scheduled Medications:  budesonide (PULMICORT) nebulizer solution  0.5 mg Nebulization BID   chlorhexidine gluconate (MEDLINE KIT)  15 mL Mouth Rinse BID   Chlorhexidine Gluconate Cloth  6 each Topical Q0600   docusate  100 mg Per Tube BID   feeding supplement (PROSource TF)  45 mL Per Tube TID  folic acid  1 mg Intravenous Daily   ipratropium-albuterol  3 mL Nebulization Q6H   labetalol  10 mg Intravenous Q4H   mouth rinse  15 mL Mouth Rinse 10 times per day   multivitamin  1 tablet Per Tube QHS   pantoprazole (PROTONIX) IV  40 mg Intravenous QHS   polyethylene glycol  17 g Per Tube Daily   sodium zirconium cyclosilicate  10 g Per Tube TID   thiamine injection  100 mg Intravenous Daily    have reviewed scheduled and prn medications.  Physical Exam: General: unresponsive on vent and sedation Heart: tachy Lungs: mostly clear Abdomen: soft, non tender Extremities: no edema Dialysis Access: left AVF-  patent     04/10/2021,7:22 AM  LOS: 1 day

## 2021-04-10 NOTE — Plan of Care (Signed)
  Problem: Nutrition: Goal: Adequate nutrition will be maintained Outcome: Progressing   Problem: Education: Goal: Knowledge of General Education information will improve Description: Including pain rating scale, medication(s)/side effects and non-pharmacologic comfort measures Outcome: Not Progressing   Problem: Health Behavior/Discharge Planning: Goal: Ability to manage health-related needs will improve Outcome: Not Progressing   Problem: Clinical Measurements: Goal: Ability to maintain clinical measurements within normal limits will improve Outcome: Not Progressing

## 2021-04-10 NOTE — Progress Notes (Signed)
[facility] Progress Note Patient Name: Jeremy Mccoy DOB: 01-Nov-1965 MRN: [id number]   Date of Service  04/10/2021  HPI/Events of Note  Na+ = 124, K+ -5.2, Ca++ = 7.7, PO4--- = 8.5. Nephrology has already ordered [facility] for hyperkalemia. Will not replace Ca++ d/t Ca++ X PO4--- product = 65.45.  eICU Interventions  Continue electrolyte management per Nephrology.         Dylin Ihnen,Tavio Cornelia Copa 04/10/2021, 12:13 AM

## 2021-04-10 NOTE — Procedures (Signed)
Patient Name: Jeremy Mccoy  MRN: [id number]  Epilepsy Attending: Lora Havens  Referring Physician/Provider: Dr Farrel Gordon Duration: 04/04/2021 1205 to 04/10/2021 1205   Patient history: 56 old male status post cardiac arrest.  EEG evaluate for seizures.   Level of alertness:  comatose   AEDs during EEG study: Propofol, versed, LEV   Technical aspects: This EEG study was done with scalp electrodes positioned according to the 10-20 International system of electrode placement. Electrical activity was acquired at a sampling rate of 500Hz  and reviewed with a high frequency filter of 70Hz  and a low frequency filter of 1Hz . EEG data were recorded continuously and digitally stored.    Description: At the beginning of study, patient was noted to have near continuous episodes of eye opening as well as bilateral upper extremity jerking.  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 Between seizures, EEG showed generalized background suppression. EEG was not reactive to stimulation.    Gradually as propofol was increased after around 1630 on 03/16/2021, myoclonic seizures became less frequent ( every few seconds). However EEG continued to show highly epileptiform bursts lasting 1-2 seconds alternating with eeg suppression lasting 2-5 seconds. After around 2030, eeg showed generalized periodic epileptifom discharges at 1hz  alternating with eeg suppression lasting 1 seconds.   Patient event button was pressed on 04/10/2021 at 0544. Per RN patient had head tremor Concomitant eeg showed generalized periodic epileptifom discharges at 1hz  alternating with eeg suppression lasting 1 seconds.    ABNORMALITY - Myoclonic status epilepticus, generalized - Burst suppression with highly epileptiform bursts, generalized - Periodic epileptifom discharges, generalized - EEG suppression, generalized   IMPRESSION: This study initially showed myoclonic status  epilepticus with generalized onset. As propofol and versed was increased, myoclonic seizures became less frequent and eventually stopped. However, EEG continues to show evidence of epileptogenicity with generalized onset as well as profound diffuse encephalopathy. This eeg pattern is on the ictal-interictal continuum with high potential for seizure recurrence. With history of cardiac arrest, this EEG is most likely due to anoxic/hypoxic brain injury.  Patient event button was pressed on 04/10/2021 at 0544. Per RN patient had head tremor without definite concomitant eeg change to suggest seizure. This was most likely not epileptic.         Melven Stockard Barbra Sarks

## 2021-04-11 ENCOUNTER — Other Ambulatory Visit: Payer: Self-pay

## 2021-04-11 DIAGNOSIS — I469 Cardiac arrest, cause unspecified: Secondary | ICD-10-CM | POA: Diagnosis not present

## 2021-04-11 DIAGNOSIS — G253 Myoclonus: Secondary | ICD-10-CM | POA: Diagnosis not present

## 2021-04-11 DIAGNOSIS — G931 Anoxic brain damage, not elsewhere classified: Secondary | ICD-10-CM | POA: Diagnosis not present

## 2021-04-11 DIAGNOSIS — J9602 Acute respiratory failure with hypercapnia: Secondary | ICD-10-CM | POA: Diagnosis not present

## 2021-04-11 DIAGNOSIS — G40901 Epilepsy, unspecified, not intractable, with status epilepticus: Secondary | ICD-10-CM | POA: Diagnosis not present

## 2021-04-11 LAB — CBC
HCT: 29.2 % — ABNORMAL LOW (ref 39.0–52.0)
Hemoglobin: 9.3 g/dL — ABNORMAL LOW (ref 13.0–17.0)
MCH: 29.3 pg (ref 26.0–34.0)
MCHC: 31.8 g/dL (ref 30.0–36.0)
MCV: 92.1 fL (ref 80.0–100.0)
Platelets: 94 10*3/uL — ABNORMAL LOW (ref 150–400)
RBC: 3.17 MIL/uL — ABNORMAL LOW (ref 4.22–5.81)
RDW: 18.6 % — ABNORMAL HIGH (ref 11.5–15.5)
WBC: 5 10*3/uL (ref 4.0–10.5)
nRBC: 0 % (ref 0.0–0.2)

## 2021-04-11 LAB — GLUCOSE, CAPILLARY
Glucose-Capillary: 91 mg/dL (ref 70–99)
Glucose-Capillary: 95 mg/dL (ref 70–99)
Glucose-Capillary: 83 mg/dL (ref 70–99)
Glucose-Capillary: 76 mg/dL (ref 70–99)
Glucose-Capillary: 104 mg/dL — ABNORMAL HIGH (ref 70–99)

## 2021-04-11 LAB — BASIC METABOLIC PANEL
Anion gap: 20 — ABNORMAL HIGH (ref 5–15)
BUN: 95 mg/dL — ABNORMAL HIGH (ref 6–20)
CO2: 18 mmol/L — ABNORMAL LOW (ref 22–32)
Calcium: 7.9 mg/dL — ABNORMAL LOW (ref 8.9–10.3)
Chloride: 92 mmol/L — ABNORMAL LOW (ref 98–111)
Creatinine, Ser: 11.86 mg/dL — ABNORMAL HIGH (ref 0.61–1.24)
GFR, Estimated: 5 mL/min — ABNORMAL LOW (ref >60)
Glucose, Bld: 104 mg/dL — ABNORMAL HIGH (ref 70–99)
Potassium: 4.9 mmol/L (ref 3.5–5.1)
Sodium: 130 mmol/L — ABNORMAL LOW (ref 135–145)

## 2021-04-11 LAB — HEPARIN LEVEL (UNFRACTIONATED): Heparin Unfractionated: 0.5 IU/mL (ref 0.30–0.70)

## 2021-04-11 LAB — APTT: aPTT: 69 seconds — ABNORMAL HIGH (ref 24–36)

## 2021-04-11 MED ORDER — METOPROLOL TARTRATE 5 MG/5ML IV SOLN
5.0000 mg | Freq: Four times a day (QID) | INTRAVENOUS | Status: DC | PRN
  Administered 2021-04-11: 23:00:00 5 mg via INTRAVENOUS
  Filled 2021-04-11: qty 5

## 2021-04-11 MED ORDER — CHLORHEXIDINE GLUCONATE CLOTH 2 % EX PADS
6.0000 | MEDICATED_PAD | Freq: Every day | CUTANEOUS | Status: DC
  Administered 2021-04-12: 05:00:00 6 via TOPICAL

## 2021-04-11 MED ORDER — HEPARIN SODIUM (PORCINE) 5000 UNIT/ML IJ SOLN
5000.0000 [IU] | Freq: Three times a day (TID) | INTRAMUSCULAR | Status: DC
  Administered 2021-04-11 – 2021-04-13 (×6): 5000 [IU] via SUBCUTANEOUS
  Filled 2021-04-11 (×6): qty 1

## 2021-04-11 MED ORDER — METOPROLOL TARTRATE 5 MG/5ML IV SOLN: 5.0000 mg | Freq: Four times a day (QID) | INTRAVENOUS | Status: DC | PRN

## 2021-04-11 NOTE — Progress Notes (Signed)
S: NAEON. 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 EEG currently shows generalized periodic discharges q 3 seconds alternating with eeg suppression.    O:  Vitals:   04/11/21 1925 04/11/21 2000  BP:  140/72  Pulse: (!) 126 (!) 129  Resp: (!) 29 (!) 29  Temp: (!) 100.8 F (38.2 C)   SpO2: 94% 96%   Physical Exam  Constitutional: Appears well-developed and well-nourished.  Psych: Unable to assess due to intubation eyes: No scleral injection HENT: No OP obstrucion Head: Normocephalic.  Cardiovascular: Normal rate and regular rhythm.  Respiratory: Intubated, coarse breath sounds bilaterally Skin: Warm Neuro: Comatose, PERRLA, corneal reflex absent, gag reflex absent, does not withdraw to noxious stimuli in all 4 extremities, no visible myoclonus on today's exam (on sedation)  I have reviewed the images obtained: MRI brain without contrast 04/03/2021: No acute finding. No acute infarct or anoxic pattern of ischemia. Small remote infarcts. Left mastoid opacification.     ASSESSMENT/PLAN: 89 old male status post cardiac arrest, 10 minutes to ROSC initially with myoclonic status epilepticus now improved to GPDs q 3 seconds alternating with eeg suppression. Will initiate sedation wean today.    Cardiac arrest Suspect anoxic/anoxic brain injury Myoclonic status epilepticus, resolved   Recommendations: - Wean versed by 10mg /hr to stop. If EEG does not worsen overnight, will plan to wean propofol tomorrow. - Continue keppra 500mg  bid (renal dosing) - Plts downtrending 160->120->94 today. Avoid VPA given worsening thrombocytopenia on heparin gtt. -Myoclonic status epilepticus within 24 hours cardiac arrest, EEG with suppression and not reacting to noxious stimulation or both indicative of significant neurological injury even though MRI brain does not show any evidence of anoxic/hypoxic brain injury. -Management of rest of comorbidities per  primary team     Thank you for allowing [place] to participate in the care of this patient. If you have any further questions, please contact the neurohospitalist.     CRITICAL CARE Performed by: Dr. Su Monks     Total critical care time: 35 minutes   Critical care time was exclusive of separately billable procedures and treating other patients.   Critical care was necessary to treat or prevent imminent or life-threatening deterioration.   Critical care was time spent personally by me on the following activities: development of treatment plan with patient and/or surrogate as well as nursing, discussions with consultants, evaluation of patient's response to treatment, examination of patient, obtaining history from patient or surrogate, ordering and performing treatments and interventions, ordering and review of laboratory studies, ordering and review of radiographic studies, pulse oximetry and re-evaluation of patient's condition.   Su Monks, MD [facility] [phone]  If 7pm- 7am, please page neurology on call as listed in [place].

## 2021-04-11 NOTE — Progress Notes (Signed)
LTM maintained. Impedance =good. No skin breakdown noted. Results pending.

## 2021-04-11 NOTE — Procedures (Addendum)
Patient Name: Jeremy Mccoy  MRN: [id number]  Epilepsy Attending: Lora Havens  Referring Physician/Provider: Dr Farrel Gordon Duration: 04/10/2021 1205 to 04/11/2021 1205   Patient history: 65 old male status post cardiac arrest.  EEG evaluate for seizures.   Level of alertness:  comatose   AEDs during EEG study: Propofol, versed, LEV   Technical aspects: This EEG study was done with scalp electrodes positioned according to the 10-20 International system of electrode placement. Electrical activity was acquired at a sampling rate of 500Hz  and reviewed with a high frequency filter of 70Hz  and a low frequency filter of 1Hz . EEG data were recorded continuously and digitally stored.    Description: EEG initially showed generalized periodic epileptifom discharges at 1hz  alternating with eeg suppression lasting 1 seconds. Gradually the generalized periodic discharges became less frequent with fluctuating frequency every 3-5 seconds and EEG in between showed low amplitude 5-6hz  generalized theta slowing.     Patient event button was pressed on 04/11/2021 at 0014. Per RN patient had tremor which was difficult to visualize on eeg. Concomitant eeg showed generalized periodic epileptifom discharges every 3 seconds alternating with eeg suppression.    ABNORMALITY - Periodic epileptifom discharges, generalized - Continuous slow, generalized   IMPRESSION: This study showed evidence of epileptogenicity with generalized onset as well as severe diffuse encephalopathy. This eeg pattern is on the ictal-interictal continuum with high potential for seizure recurrence. With history of cardiac arrest, this EEG is most likely due to anoxic/hypoxic brain injury.   Patient event button was pressed on 04/11/2021 at 0014. Per RN patient had tremor which was difficult to visualize on eeg. Concomitant eeg did not show any definite EEG change to suggest seizure. This was most likely not epileptic.  EEG appears to be  improving compared to yesterday.  Christipher Rieger Barbra Sarks

## 2021-04-11 NOTE — Progress Notes (Addendum)
[facility] Progress Note Patient Name: Jeremy Mccoy DOB: 10/04/1965 MRN: [id number]   Date of Service  04/11/2021  HPI/Events of Note  Pt tachy to 130's (sinus) despite inc'ed sedation.  Also, pt is off pressors  eICU Interventions  Added prn metoprolol IV pushes for HR > 110.  Later in shift, MAP dropped; therefore holding beta blocker.  Will readdress if HR rises     Intervention Category Intermediate Interventions: Arrhythmia - evaluation and management  Tilden Dome 04/11/2021, 10:46 PM

## 2021-04-11 NOTE — Progress Notes (Signed)
ANTICOAGULATION CONSULT NOTE - Follow Up Consult  Pharmacy Consult for heparin Indication:  Afib and r/o ACS in setting of cardiac arrest  Labs: Recent Labs    04/10/2021 0122 03/22/2021 0134 04/14/2021 0647 04/05/2021 0839 04/14/2021 0947 04/04/2021 1154 04/08/2021 1819 04/04/2021 2151 04/10/21 0222 04/10/21 0500 04/10/21 0752 04/10/21 1010 04/10/21 1119 04/10/21 1304 04/10/21 1638 04/10/21 1925 04/10/21 2054 04/10/21 2255  HGB 12.5*   < > 11.6*  --   --    < >  --   --  10.4*  --   --   --  9.9*  --   --  10.5* 9.9*  --   HCT 43.0   < > 36.9*  --   --    < >  --   --  32.0*  --   --   --  29.0*  --   --  31.0* 29.0*  --   PLT 217  --  161  --   --   --   --   --  126*  --   --   --   --   --   --   --   --   --   APTT  --   --   --   --   --    < > 46*  --  49*  --   --   --   --  58*  --   --   --  59*  HEPARINUNFRC  --   --   --   --   --   --  0.92*  --   --  0.52  --   --   --   --   --   --   --   --   CREATININE  --    < >  --   --  14.58*  --  9.52*   < > 10.79*  --  11.05*  --   --   --  11.50*  --   --  11.97*  TROPONINIHS  --   --  1,531*   < > 2,372*  --   --   --   --   --  2,160* 1,851*  --   --   --   --   --   --    < > = values in this interval not displayed.     Assessment: 55yo male subtherapeutic on heparin with very little change in PTT after heparin rate change; no infusion issues or signs of bleeding per RN.  Goal of Therapy:  aPTT 66-102 seconds   Plan:  Will increase heparin infusion by 10-20% to 2300 units/hr and check PTT in 8 hours.    Wynona Neat, PharmD, BCPS  04/11/2021,12:21 AM

## 2021-04-11 NOTE — Progress Notes (Signed)
NAME:  Jeremy Mccoy, MRN:  [id number], DOB:  18-May-1965, LOS: 2 ADMISSION DATE:  03/26/2021, CONSULTATION DATE:  03/16/2021 REFERRING MD:  EDP, CHIEF COMPLAINT:  PEA arrest   History of Present Illness:  Jeremy Mccoy is a 55 y.o. M who was at home when he had a witnessed arrest. It is unknown if bystander CPR was initiated, but was in CPR when EMS arrived and was given two rounds of epinephrine and Calcium with ROSC achieved after approximately 10 minutes.   He was intubated in the ED.  Labs showed K of 7.5 and pH 6.9 with hypertension. Per report, he may have left dialysis early 11/22. CT head negative.  In the ED he was given bicarb, lokelma and insulin and nephrology was consulted.  Pertinent  Medical History  Anemia, Anxiety, Arthritis, CHF (congestive heart failure) ([place]), COPD (chronic obstructive pulmonary disease) ([place]), Crack cocaine use, Dental caries, Depression, ESRD (end stage renal disease) on dialysis ([place]), Headache(784.0), Hematemesis/vomiting blood, History of blood transfusion (10/2011; 04/2013), HTN (hypertension) (06/12/2012), Seizures ([place]), and Shortness of breath.  Significant Hospital Events: Including procedures, antibiotic start and stop dates in addition to other pertinent events   11/25 PEA arrest, intubated and PCCM admit  Interim History / Subjective:   No acute events overnight.   cEEG with no further seizure activity.   Objective   Blood pressure (!) 101/58, pulse 94, temperature 98.8 F (37.1 C), temperature source Axillary, resp. rate (!) 28, height 6\' 1"  (1.854 m), weight 85.5 kg, SpO2 100 %.    Vent Mode: PRVC FiO2 (%):  [40 %] 40 % Set Rate:  [24 bmp-28 bmp] 24 bmp Vt Set:  [640 mL] 640 mL PEEP:  [5 cmH20] 5 cmH20 Plateau Pressure:  [15 cmH20-21 cmH20] 21 cmH20   Intake/Output Summary (Last 24 hours) at 04/11/2021 0736 Last data filed at 04/11/2021 0700 Gross per 24 hour  Intake 4714.13 ml  Output --  Net 4714.13 ml   Filed Weights    04/04/2021 1710 04/10/21 0430 04/11/21 0500  Weight: 68 kg 80.4 kg 85.5 kg    Examination: Constitutional: chronically ill male, no acute distress Eyes: Pupils are pinpoint and non-reactive, sclera anicteric Cardio: Regular rate and rhythm. No murmurs, rubs, gallops. Pulm: Intubated. Clear to auscultation bilaterally. Abdomen: soft, non-distended MSK: no edema, warm Skin: Skin is cool and dry. Neuro: [facility] Problem list      Assessment & Plan:  PEA arrest In setting of hyperkalemia - Trend troponins - Echo without acute wall motion abnormalities - stop heparin  Acute encephalopathy Status Epilepticus - Neurology following - cEEG - Keppra 1,000 mg loading dose, continued on keppra 500 mg BID - wean propofol and versed per neurology as burst suppression complete  ESRD on HD M/W/F Anion gap metabolic acidosis Mild Hyponatremia Hyperkalemia Lactic Acidosis, resolved - HD per nephrology - Trend BMP - continue Lokelma  Anemia of Chronic disease Thrombocytopenia - transfuse for hgb <7g/dL - monitor platelet levels  COPD - Duonebs PRN  History of atrial fibrillation/flutter - cont home eliquis when able - Hold home metoprolol and hydralazine  Best Practice (right click and "Reselect all SmartList Selections" daily)   Diet/type: tubefeeds DVT prophylaxis: prophylactic heparin  GI prophylaxis: PPI Lines: N/A Foley:  N/A Code Status:  full code Last date of multidisciplinary goals of care discussion [11/25]  Labs   CBC: Recent Labs  Lab 03/20/2021 0122 04/04/2021 0134 03/18/2021 0647 03/18/2021 1154 04/10/21 0222 04/10/21 1119 04/10/21 1925 04/10/21  2054 04/11/21 0359  WBC 10.7*  --  11.7*  --  10.2  --   --   --  5.0  NEUTROABS 7.2  --   --   --   --   --   --   --   --   HGB 12.5*   < > 11.6*   < > 10.4* 9.9* 10.5* 9.9* 9.3*  HCT 43.0   < > 36.9*   < > 32.0* 29.0* 31.0* 29.0* 29.2*  MCV 100.2*  --  94.6  --  91.7  --   --   --  92.1   PLT 217  --  161  --  126*  --   --   --  94*   < > = values in this interval not displayed.    Basic Metabolic Panel: Recent Labs  Lab 04/02/2021 0312 03/29/2021 0356 03/25/2021 0647 04/05/2021 0947 04/10/21 0222 04/10/21 0752 04/10/21 1119 04/10/21 1638 04/10/21 1925 04/10/21 2054 04/10/21 2255 04/11/21 0359  NA 135   < >  --    < > 133* 132*   < > 131* 131* 129* 129* 130*  K >7.5*   < >  --    < > 5.2* 4.7   < > 4.6 4.6 4.7 4.9 4.9  CL 93*  --   --    < > 93* 92*  --  91*  --   --  91* 92*  CO2 16*  --   --    < > 23 20*  --  21*  --   --  19* 18*  GLUCOSE 128*  --   --    < > 113* 120*  --  120*  --   --  118* 104*  BUN 114*  --   --    < > 77* 82*  --  90*  --   --  95* 95*  CREATININE 14.79*  --   --    < > 10.79* 11.05*  --  11.50*  --   --  11.97* 11.86*  CALCIUM 10.4*  --   --    < > 8.2* 7.7*  --  8.1*  --   --  7.8* 7.9*  MG 3.1*  --  2.7*  --   --   --   --   --   --   --   --   --    < > = values in this interval not displayed.   GFR: Estimated Creatinine Clearance: 8 mL/min (A) (by C-G formula based on SCr of 11.86 mg/dL (H)). Recent Labs  Lab 04/12/2021 0122 04/05/2021 0312 04/12/2021 0647 04/10/21 0222 04/10/21 0752 04/10/21 1010 04/11/21 0359  WBC 10.7*  --  11.7* 10.2  --   --  5.0  LATICACIDVEN  --  7.2*  --   --  2.0* 1.7  --     Liver Function Tests: Recent Labs  Lab 03/30/2021 0312  AST 31  ALT 21  ALKPHOS 107  BILITOT 0.9  PROT 7.6  ALBUMIN 3.7   No results for input(s): LIPASE, AMYLASE in the last 168 hours. Recent Labs  Lab 03/22/2021 0312  AMMONIA 44*    ABG    Component Value Date/Time   PHART 7.338 (L) 04/10/2021 2054   PCO2ART 42.4 04/10/2021 2054   PO2ART 118 (H) 04/10/2021 2054   HCO3 22.7 04/10/2021 2054   TCO2 24 04/10/2021 2054   ACIDBASEDEF 3.0 (H) 04/10/2021 2054  O2SAT 98.0 04/10/2021 2054     Coagulation Profile: No results for input(s): INR, PROTIME in the last 168 hours.  Cardiac Enzymes: No results for input(s):  CKTOTAL, CKMB, CKMBINDEX, TROPONINI in the last 168 hours.  HbA1C: Hgb A1c MFr Bld  Date/Time Value Ref Range Status  12/19/2020 04:59 AM 5.3 4.8 - 5.6 % Final    Comment:    (NOTE) Pre diabetes:          5.7%-6.4%  Diabetes:              >6.4%  Glycemic control for   <7.0% adults with diabetes   03/27/2020 02:55 AM 5.1 4.8 - 5.6 % Final    Comment:    (NOTE) Pre diabetes:          5.7%-6.4%  Diabetes:              >6.4%  Glycemic control for   <7.0% adults with diabetes     CBG: Recent Labs  Lab 04/10/21 1538 04/10/21 1910 04/10/21 2309 04/11/21 0317 04/11/21 0709  GLUCAP 116* 108* 104* 91 95   Critical care time: 35 minutes    Freda Jackson, MD [facility] Office: [phone]   See Amion for personal pager PCCM on call pager [phone] until 7pm. Please call Elink 7p-7a. [phone]

## 2021-04-11 NOTE — Progress Notes (Signed)
Subjective:  Has remained relatively stable in ICU-  sx suppressed-  weaned off of levo but now back on-  K has held with scheduled lokelma   Objective Vital signs in last 24 hours: Vitals:   04/11/21 0645 04/11/21 0700 04/11/21 0710 04/11/21 0725  BP: (!) 92/55 (!) 101/58    Pulse: 97 95  94  Resp: (!) 28 (!) 28  (!) 28  Temp:   98.8 F (37.1 C)   TempSrc:   Axillary   SpO2: 100% 100%  100%  Weight:      Height:       Weight change: 15.5 kg  Intake/Output Summary (Last 24 hours) at 04/11/2021 0738 Last data filed at 04/11/2021 0700 Gross per 24 hour  Intake 4714.13 ml  Output --  Net 4714.13 ml    HD at AF MWF 4 hours, leaving after 2.5 hours most days- AVF- 400 BFR  runs on 1 K bath for chronic hyperkalemia-  EDW 68-  hectorol 2 and parsabiv 15   Assessment/ Plan: Pt is a 55 y.o. yo male with ESRD , non compliance who was admitted on 03/28/2021 with s/p cardiac arrest-  felt due to hyperkalemia  Assessment/Plan: 1. S/p cardiac arrest- outside the hospital-  down for at least 10 minutes.  Now with possible evidence of anoxic brain injury with sz activity -  on keppra and neuro following 2. ESRD -  normally MWF - notoriously non compliant-  signs off early most treatments and has been doing for years-  went Sun and Tuesday this week but signed off-  last done 11/25 in ICU.  Now that on pressors may need CRRT-   but no acute indications -  surprised that his potassium has held with lokelma-  will tentatively plan for IHD tomorrow-  hoping will be off or only on a little pressors at that time 3. Hyperkalemia-  chronic issue- runs on 1 K bath-  now catabolic with k over 5 hours after HD-  on scheduled lokelma -  will follow labs -  if continues to rise precipitously will need CRRT but hasnt yet-  plan on HD tomorrow as above  3. Anemia-  not too much of a concern yet 4. Secondary hyperparathyroidism - calcium OK-  no treatment for now 5. HTN/volume-  volume is not an issue for him-   not wet-  hypotensive on pressors 6.  SZ-   concerning for anoxic brain injury-  first MRI was negative-  cont to follow exam and neuro on board  [place]: Basic Metabolic Panel: Recent Labs  Lab 04/10/21 1638 04/10/21 1925 04/10/21 2054 04/10/21 2255 04/11/21 0359  NA 131*   < > 129* 129* 130*  K 4.6   < > 4.7 4.9 4.9  CL 91*  --   --  91* 92*  CO2 21*  --   --  19* 18*  GLUCOSE 120*  --   --  118* 104*  BUN 90*  --   --  95* 95*  CREATININE 11.50*  --   --  11.97* 11.86*  CALCIUM 8.1*  --   --  7.8* 7.9*   < > = values in this interval not displayed.   Liver Function Tests: Recent Labs  Lab 03/29/2021 0312  AST 31  ALT 21  ALKPHOS 107  BILITOT 0.9  PROT 7.6  ALBUMIN 3.7   No results for input(s): LIPASE, AMYLASE in the last 168 hours.  Recent Labs  Lab 03/21/2021 0312  AMMONIA 44*   CBC: Recent Labs  Lab 03/19/2021 0122 04/01/2021 0134 04/10/2021 0647 03/28/2021 1154 04/10/21 0222 04/10/21 1119 04/10/21 1925 04/10/21 2054 04/11/21 0359  WBC 10.7*  --  11.7*  --  10.2  --   --   --  5.0  NEUTROABS 7.2  --   --   --   --   --   --   --   --   HGB 12.5*   < > 11.6*   < > 10.4*   < > 10.5* 9.9* 9.3*  HCT 43.0   < > 36.9*   < > 32.0*   < > 31.0* 29.0* 29.2*  MCV 100.2*  --  94.6  --  91.7  --   --   --  92.1  PLT 217  --  161  --  126*  --   --   --  94*   < > = values in this interval not displayed.   Cardiac Enzymes: No results for input(s): CKTOTAL, CKMB, CKMBINDEX, TROPONINI in the last 168 hours. CBG: Recent Labs  Lab 04/10/21 1538 04/10/21 1910 04/10/21 2309 04/11/21 0317 04/11/21 0709  GLUCAP 116* 108* 104* 91 95    Iron Studies: No results for input(s): IRON, TIBC, TRANSFERRIN, FERRITIN in the last 72 hours. Studies/Results: MR BRAIN WO CONTRAST  Result Date: 04/10/2021 CLINICAL DATA:  Anoxic brain injury. EXAM: MRI HEAD WITHOUT CONTRAST TECHNIQUE: Multiplanar, multiecho pulse sequences of the brain and surrounding  structures were obtained without intravenous contrast. COMPARISON:  Head CT from earlier today FINDINGS: Brain: No acute restricted diffusion, focal infarction, hemorrhage, hydrocephalus, extra-axial collection or mass lesion. Brain volume is stable from 2015 with no convincing swelling on FLAIR images. Mild FLAIR hyperintensity in the cerebral white matter attributed to chronic small vessel disease given medical history. 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 Vascular: Normal flow voids and vascular enhancements. Skull and upper cervical spine: Normal marrow signal Sinuses/Orbits: Left mastoid opacification.  Nasopharyngeal fluid. IMPRESSION: 1. No acute finding. No acute infarct or anoxic pattern of ischemia. 2. Small remote infarcts. 3. Left mastoid opacification. Electronically Signed   By: Jorje Guild M.D.   On: 04/01/2021 11:32   DG Chest Port 1 View  Result Date: 04/10/2021 CLINICAL DATA:  Difficulty breathing EXAM: PORTABLE CHEST 1 VIEW COMPARISON:  Previous studies including the examination of 03/29/2021 FINDINGS: Tip of endotracheal tube is 6.5 cm above the carina. Enteric tube is noted in the course of esophagus. Transverse diameter of heart is increased. Central pulmonary vessels are prominent. Diffuse increase in interstitial markings are noted throughout both lungs. There is possible worsening of this finding both lungs, especially in the lower lung fields. Costophrenic angles are clear. There is no pneumothorax. IMPRESSION: There is diffuse increase in interstitial markings in both lungs with possible worsening suggesting interstitial pneumonia or interstitial edema. Part of this finding may suggest underlying scarring. Cardiomegaly. Central pulmonary vessels are prominent. There is no significant pleural effusion or pneumothorax. Electronically Signed   By: Elmer Picker M.D.   On: 04/10/2021 08:11   EEG adult  Result Date: 03/23/2021 Lora Havens, MD     03/18/2021  1:24 PM Patient Name: Jeremy Mccoy MRN: [id number] Epilepsy Attending: Lora Havens Referring Physician/Provider: Dr Farrel Gordon Date: 04/12/2021 Duration: 21.22 mins Patient history: 9 old male status post cardiac arrest.  EEG evaluate for seizures. Level of alertness:  comatose  AEDs during EEG study: Propofol, LEV Technical aspects: This EEG study was done with scalp electrodes positioned according to the 10-20 International system of electrode placement. Electrical activity was acquired at a sampling rate of 500Hz  and reviewed with a high frequency filter of 70Hz  and a low frequency filter of 1Hz . EEG data were recorded continuously and digitally stored. Description: Patient was noted to have near continuous episodes of eye opening as well as bilateral upper extremity jerking.  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  Between seizures, EEG showed generalized background suppression.  EEG was not reactive to stimulation.  Hyperventilation and photic stimulation were not performed.   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  With history of cardiac arrest, this EEG is most likely due to anoxic/hypoxic brain injury. Dr Marlou Sa was notified Lora Havens   Overnight EEG with video  Result Date: 04/10/2021 Lora Havens, MD     04/10/2021  6:24 PM Patient Name: Jeremy Mccoy MRN: [id number] Epilepsy Attending: Lora Havens Referring Physician/Provider: Dr Farrel Gordon Duration: 03/22/2021 1205 to 04/10/2021 1205  Patient history: 2 old male status post cardiac arrest.  EEG evaluate for seizures.  Level of alertness:  comatose  AEDs during EEG study: Propofol, versed, LEV  Technical aspects: This EEG study was done with scalp electrodes  positioned according to the 10-20 International system of electrode placement. Electrical activity was acquired at a sampling rate of 500Hz  and reviewed with a high frequency filter of 70Hz  and a low frequency filter of 1Hz . EEG data were recorded continuously and digitally stored.  Description: At the beginning of study, patient was noted to have near continuous episodes of eye opening as well as bilateral upper extremity jerking.  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 Between seizures, EEG showed generalized background suppression. EEG was not reactive to stimulation.  Gradually as propofol was increased after around 1630 on 04/08/2021, myoclonic seizures became less frequent ( every few seconds). However EEG continued to show highly epileptiform bursts lasting 1-2 seconds alternating with eeg suppression lasting 2-5 seconds. After around 2030, eeg showed generalized periodic epileptifom discharges at 1hz  alternating with eeg suppression lasting 1 seconds. Patient event button was pressed on 04/10/2021 at 0544. Per RN patient had head tremor Concomitant eeg showed generalized periodic epileptifom discharges at 1hz  alternating with eeg suppression lasting 1 seconds.  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 As propofol and versed was increased, myoclonic seizures became less frequent and eventually stopped. However, EEG continues to show evidence of epileptogenicity with generalized onset as well as profound diffuse encephalopathy. This eeg pattern is on the ictal-interictal continuum with high potential for seizure recurrence. With history of cardiac arrest, this EEG is most likely due to anoxic/hypoxic brain  injury. Patient event button was pressed on 04/10/2021 at 0544. Per RN patient had head tremor without definite concomitant eeg change to suggest seizure. This was most likely not epileptic.   Lora Havens   ECHOCARDIOGRAM COMPLETE  Result Date: 04/10/2021    ECHOCARDIOGRAM REPORT   Patient Name:   Jeremy Mccoy [facility] Date of Exam: 04/10/2021 Medical Rec #:  [id number]        Height:  73.0 in Accession #:    [id number]       Weight:       177.2 lb Date of Birth:  1965/05/19        BSA:          2.044 m Patient Age:    44 years         BP:           99/88 mmHg Patient Gender: M                HR:           104 bpm. Exam Location:  Inpatient Procedure: 2D Echo, Cardiac Doppler and Color Doppler Indications:     Acute MI  History:         Patient has prior history of Echocardiogram examinations, most                  recent 12/19/2020. COPD, Signs/Symptoms:Syncope; Risk                  Factors:Hypertension.  Sonographer:     Glo Herring Referring Phys:  [id number] Renee Pain Diagnosing Phys: Oswaldo Milian MD IMPRESSIONS  1. Left ventricular ejection fraction, by estimation, is 60 to 65%. The left ventricle has normal function. The left ventricle has no regional wall motion abnormalities. There is mild left ventricular hypertrophy. Left ventricular diastolic parameters are indeterminate.  2. Right ventricular systolic function is mildly reduced. The right ventricular size is mildly enlarged. There is moderately elevated pulmonary artery systolic pressure. The estimated right ventricular systolic pressure is 03.5 mmHg.  3. Left atrial size was moderately dilated.  4. The mitral valve is abnormal. No evidence of mitral valve regurgitation. Severe mitral stenosis. MVA 1.4 cm^2 by continuity equation. MG 12 mmHg at 104 bpm. Severe mitral annular calcification.  5. The aortic valve is tricuspid. Aortic valve regurgitation is not visualized. Aortic valve sclerosis is present, with no evidence of aortic  valve stenosis.  6. The inferior vena cava is dilated in size with <50% respiratory variability, suggesting right atrial pressure of 15 mmHg. FINDINGS  Left Ventricle: Left ventricular ejection fraction, by estimation, is 60 to 65%. The left ventricle has normal function. The left ventricle has no regional wall motion abnormalities. The left ventricular internal cavity size was normal in size. There is  mild left ventricular hypertrophy. Left ventricular diastolic parameters are indeterminate. Right Ventricle: The right ventricular size is mildly enlarged. Right vetricular wall thickness was not well visualized. Right ventricular systolic function is mildly reduced. There is moderately elevated pulmonary artery systolic pressure. The tricuspid  regurgitant velocity is 2.98 m/s, and with an assumed right atrial pressure of 15 mmHg, the estimated right ventricular systolic pressure is 59.7 mmHg. Left Atrium: Left atrial size was moderately dilated. Right Atrium: Right atrial size was normal in size. Pericardium: There is no evidence of pericardial effusion. Mitral Valve: The mitral valve is abnormal. Severe mitral annular calcification. No evidence of mitral valve regurgitation. Severe mitral valve stenosis. MV peak gradient, 16.9 mmHg. The mean mitral valve gradient is 10.5 mmHg. Tricuspid Valve: The tricuspid valve is normal in structure. Tricuspid valve regurgitation is mild. Aortic Valve: The aortic valve is tricuspid. Aortic valve regurgitation is not visualized. Aortic valve sclerosis is present, with no evidence of aortic valve stenosis. Aortic valve mean gradient measures 7.0 mmHg. Aortic valve peak gradient measures 11.7 mmHg. Aortic valve area, by VTI measures 2.29 cm. Pulmonic Valve: The pulmonic valve was grossly  normal. Pulmonic valve regurgitation is not visualized. Aorta: The aortic root and ascending aorta are structurally normal, with no evidence of dilitation. Venous: The inferior vena cava is  dilated in size with less than 50% respiratory variability, suggesting right atrial pressure of 15 mmHg. IAS/Shunts: The interatrial septum was not well visualized.  LEFT VENTRICLE PLAX 2D LVIDd:         4.30 cm LVIDs:         2.80 cm LV PW:         1.30 cm LV IVS:        1.30 cm LVOT diam:     2.00 cm LV SV:         63 LV SV Index:   31 LVOT Area:     3.14 cm  LV Volumes (MOD) LV vol d, MOD A2C: 126.0 ml LV vol d, MOD A4C: 137.0 ml LV vol s, MOD A2C: 55.6 ml LV vol s, MOD A4C: 54.6 ml LV SV MOD A2C:     70.4 ml LV SV MOD A4C:     137.0 ml LV SV MOD BP:      75.5 ml RIGHT VENTRICLE            IVC RV Basal diam:  4.00 cm    IVC diam: 2.30 cm RV S prime:     9.36 cm/s LEFT ATRIUM           Index        RIGHT ATRIUM           Index LA diam:      4.80 cm 2.35 cm/m   RA Area:     18.70 cm LA Vol (A4C): 90.9 ml 44.47 ml/m  RA Volume:   48.60 ml  23.78 ml/m  AORTIC VALVE                     PULMONIC VALVE AV Area (Vmax):    2.22 cm      PV Vmax:       1.06 m/s AV Area (Vmean):   2.13 cm      PV Peak grad:  4.5 mmHg AV Area (VTI):     2.29 cm AV Vmax:           171.00 cm/s AV Vmean:          126.000 cm/s AV VTI:            0.274 m AV Peak Grad:      11.7 mmHg AV Mean Grad:      7.0 mmHg LVOT Vmax:         121.00 cm/s LVOT Vmean:        85.300 cm/s LVOT VTI:          0.200 m LVOT/AV VTI ratio: 0.73  AORTA Ao Root diam: 3.20 cm Ao Asc diam:  2.60 cm MITRAL VALVE              TRICUSPID VALVE MV Area VTI:  1.41 cm    TR Peak grad:   35.5 mmHg MV Peak grad: 16.9 mmHg   TR Vmax:        298.00 cm/s MV Mean grad: 10.5 mmHg MV Vmax:      2.06 m/s    SHUNTS MV Vmean:     154.5 cm/s  Systemic VTI:  0.20 m  Systemic Diam: 2.00 cm Oswaldo Milian MD Electronically signed by Oswaldo Milian MD Signature Date/Time: 04/10/2021/4:46:30 PM    Final (Updated)    Medications: Infusions:  sodium chloride Stopped (04/10/21 0953)   feeding supplement (NEPRO CARB STEADY) 1,000 mL (04/10/21 1603)    fentaNYL infusion INTRAVENOUS Stopped (04/14/2021 1100)   heparin 2,300 Units/hr (04/11/21 0700)   levETIRAcetam Stopped (04/10/21 2137)   midazolam (VERSED) infusion 60 mg/hr (04/11/21 0724)   norepinephrine (LEVOPHED) Adult infusion 2 mcg/min (04/11/21 0700)   propofol (DIPRIVAN) infusion 80 mcg/kg/min (04/11/21 0700)    Scheduled Medications:  budesonide (PULMICORT) nebulizer solution  0.5 mg Nebulization BID   chlorhexidine gluconate (MEDLINE KIT)  15 mL Mouth Rinse BID   Chlorhexidine Gluconate Cloth  6 each Topical Q0600   docusate  100 mg Per Tube BID   feeding supplement (PROSource TF)  45 mL Per Tube TID   folic acid  1 mg Intravenous Daily   ipratropium-albuterol  3 mL Nebulization Q6H   mouth rinse  15 mL Mouth Rinse 10 times per day   multivitamin  1 tablet Per Tube QHS   pantoprazole (PROTONIX) IV  40 mg Intravenous QHS   polyethylene glycol  17 g Per Tube Daily   sodium zirconium cyclosilicate  10 g Per Tube TID   thiamine injection  100 mg Intravenous Daily    have reviewed scheduled and prn medications.  Physical Exam: General: unresponsive on vent and sedation Heart: tachy Lungs: mostly clear Abdomen: soft, non tender Extremities: no edema Dialysis Access: left AVF-  patent     04/11/2021,7:38 AM  LOS: 2 days

## 2021-04-12 ENCOUNTER — Inpatient Hospital Stay (HOSPITAL_COMMUNITY): Payer: Medicare Other

## 2021-04-12 DIAGNOSIS — J9602 Acute respiratory failure with hypercapnia: Secondary | ICD-10-CM | POA: Diagnosis not present

## 2021-04-12 DIAGNOSIS — I469 Cardiac arrest, cause unspecified: Secondary | ICD-10-CM | POA: Diagnosis not present

## 2021-04-12 DIAGNOSIS — G40901 Epilepsy, unspecified, not intractable, with status epilepticus: Secondary | ICD-10-CM | POA: Diagnosis not present

## 2021-04-12 DIAGNOSIS — G931 Anoxic brain damage, not elsewhere classified: Secondary | ICD-10-CM | POA: Diagnosis not present

## 2021-04-12 DIAGNOSIS — G253 Myoclonus: Secondary | ICD-10-CM | POA: Diagnosis not present

## 2021-04-12 LAB — CBC
HCT: 30.8 % — ABNORMAL LOW (ref 39.0–52.0)
Hemoglobin: 9.7 g/dL — ABNORMAL LOW (ref 13.0–17.0)
MCH: 29.2 pg (ref 26.0–34.0)
MCHC: 31.5 g/dL (ref 30.0–36.0)
MCV: 92.8 fL (ref 80.0–100.0)
Platelets: 82 10*3/uL — ABNORMAL LOW (ref 150–400)
RBC: 3.32 MIL/uL — ABNORMAL LOW (ref 4.22–5.81)
RDW: 18.8 % — ABNORMAL HIGH (ref 11.5–15.5)
WBC: 3.1 10*3/uL — ABNORMAL LOW (ref 4.0–10.5)
nRBC: 0 % (ref 0.0–0.2)

## 2021-04-12 LAB — RENAL FUNCTION PANEL
Albumin: 2.4 g/dL — ABNORMAL LOW (ref 3.5–5.0)
Anion gap: 23 — ABNORMAL HIGH (ref 5–15)
BUN: 118 mg/dL — ABNORMAL HIGH (ref 6–20)
CO2: 15 mmol/L — ABNORMAL LOW (ref 22–32)
Calcium: 8 mg/dL — ABNORMAL LOW (ref 8.9–10.3)
Chloride: 87 mmol/L — ABNORMAL LOW (ref 98–111)
Creatinine, Ser: 12.97 mg/dL — ABNORMAL HIGH (ref 0.61–1.24)
GFR, Estimated: 4 mL/min — ABNORMAL LOW (ref >60)
Glucose, Bld: 93 mg/dL (ref 70–99)
Phosphorus: 11.3 mg/dL — ABNORMAL HIGH (ref 2.5–4.6)
Potassium: 5 mmol/L (ref 3.5–5.1)
Sodium: 125 mmol/L — ABNORMAL LOW (ref 135–145)

## 2021-04-12 LAB — GLUCOSE, CAPILLARY
Glucose-Capillary: 115 mg/dL — ABNORMAL HIGH (ref 70–99)
Glucose-Capillary: 66 mg/dL — ABNORMAL LOW (ref 70–99)
Glucose-Capillary: 69 mg/dL — ABNORMAL LOW (ref 70–99)
Glucose-Capillary: 74 mg/dL (ref 70–99)
Glucose-Capillary: 88 mg/dL (ref 70–99)
Glucose-Capillary: 89 mg/dL (ref 70–99)
Glucose-Capillary: 90 mg/dL (ref 70–99)
Glucose-Capillary: 94 mg/dL (ref 70–99)
Glucose-Capillary: 97 mg/dL (ref 70–99)

## 2021-04-12 MED ORDER — DEXTROSE 50 % IV SOLN: 25.0000 g | INTRAVENOUS | Status: AC

## 2021-04-12 MED ORDER — DEXTROSE-NACL 5-0.9 % IV SOLN: INTRAVENOUS | Status: DC

## 2021-04-12 MED ORDER — DEXTROSE 50 % IV SOLN
12.5000 g | INTRAVENOUS | Status: AC
  Administered 2021-04-12: 16:00:00 12.5 g via INTRAVENOUS

## 2021-04-12 MED ORDER — VALPROIC ACID 250 MG/5ML PO SOLN
500.0000 mg | Freq: Two times a day (BID) | ORAL | Status: DC
  Administered 2021-04-13 (×2): 500 mg
  Filled 2021-04-12 (×2): qty 10

## 2021-04-12 MED ORDER — VALPROATE SODIUM 100 MG/ML IV SOLN
1500.0000 mg | Freq: Once | INTRAVENOUS | Status: AC
  Administered 2021-04-12: 20:00:00 1500 mg via INTRAVENOUS
  Filled 2021-04-12: qty 15

## 2021-04-12 MED ORDER — FENTANYL CITRATE (PF) 100 MCG/2ML IJ SOLN
50.0000 ug | Freq: Once | INTRAMUSCULAR | Status: AC
  Administered 2021-04-12: 20:00:00 50 ug via INTRAVENOUS

## 2021-04-12 MED ORDER — DEXTROSE 10 % IV SOLN: INTRAVENOUS | Status: DC

## 2021-04-12 MED ORDER — NOREPINEPHRINE 4 MG/250ML-% IV SOLN
2.0000 ug/min | INTRAVENOUS | Status: DC
  Administered 2021-04-12: 09:00:00 10 ug/min via INTRAVENOUS
  Administered 2021-04-13: 2 ug/min via INTRAVENOUS
  Administered 2021-04-13: 10 ug/min via INTRAVENOUS
  Filled 2021-04-12 (×4): qty 250

## 2021-04-12 MED ORDER — DEXMEDETOMIDINE HCL IN NACL 400 MCG/100ML IV SOLN
0.4000 ug/kg/h | INTRAVENOUS | Status: DC
Start: 2021-04-12 — End: 2021-04-13
  Administered 2021-04-12 – 2021-04-13 (×2): 0.4 ug/kg/h via INTRAVENOUS
  Administered 2021-04-13: 0.7 ug/kg/h via INTRAVENOUS

## 2021-04-12 MED ORDER — DEXMEDETOMIDINE HCL IN NACL 400 MCG/100ML IV SOLN
INTRAVENOUS | Status: AC
  Filled 2021-04-12: qty 100

## 2021-04-12 MED ORDER — METOPROLOL TARTRATE 5 MG/5ML IV SOLN
5.0000 mg | Freq: Four times a day (QID) | INTRAVENOUS | Status: DC | PRN
  Administered 2021-04-12: 18:00:00 5 mg via INTRAVENOUS
  Administered 2021-04-13: 3 mg via INTRAVENOUS
  Filled 2021-04-12 (×2): qty 5

## 2021-04-12 MED ORDER — PANCRELIPASE (LIP-PROT-AMYL) 10440-39150 UNITS PO TABS
20880.0000 [IU] | ORAL_TABLET | Freq: Once | ORAL | Status: AC
  Administered 2021-04-12: 13:00:00 20880 [IU]
  Filled 2021-04-12: qty 2

## 2021-04-12 MED ORDER — LORAZEPAM 2 MG/ML IJ SOLN
2.0000 mg | Freq: Once | INTRAMUSCULAR | Status: AC
  Administered 2021-04-12: 19:00:00 2 mg via INTRAVENOUS
  Filled 2021-04-12 (×2): qty 1

## 2021-04-12 MED ORDER — LORAZEPAM 2 MG/ML IJ SOLN
2.0000 mg | INTRAMUSCULAR | Status: DC | PRN
  Administered 2021-04-12 – 2021-04-13 (×3): 2 mg via INTRAVENOUS
  Filled 2021-04-12 (×5): qty 1

## 2021-04-12 MED ORDER — MIDODRINE HCL 5 MG PO TABS
10.0000 mg | ORAL_TABLET | Freq: Three times a day (TID) | ORAL | Status: DC
  Administered 2021-04-12 – 2021-04-13 (×4): 10 mg via ORAL
  Filled 2021-04-12 (×4): qty 2

## 2021-04-12 MED ORDER — DEXTROSE 50 % IV SOLN
INTRAVENOUS | Status: AC
  Filled 2021-04-12: qty 50

## 2021-04-12 MED ORDER — DEXTROSE 50 % IV SOLN
INTRAVENOUS | Status: AC
  Administered 2021-04-12: 16:00:00 50 mL
  Filled 2021-04-12: qty 50

## 2021-04-12 MED ORDER — SODIUM CHLORIDE 0.9 % IV BOLUS
500.0000 mL | Freq: Once | INTRAVENOUS | Status: AC
  Administered 2021-04-12: 07:00:00 500 mL via INTRAVENOUS

## 2021-04-12 MED ORDER — FENTANYL CITRATE (PF) 100 MCG/2ML IJ SOLN
25.0000 ug | INTRAMUSCULAR | Status: DC | PRN
Start: 2021-04-12 — End: 2021-04-13
  Administered 2021-04-12 – 2021-04-13 (×2): 100 ug via INTRAVENOUS
  Filled 2021-04-12 (×4): qty 2

## 2021-04-12 MED ORDER — AMIODARONE HCL 200 MG PO TABS
200.0000 mg | ORAL_TABLET | Freq: Every day | ORAL | Status: DC
  Administered 2021-04-12 – 2021-04-13 (×2): 200 mg
  Filled 2021-04-12 (×2): qty 1

## 2021-04-12 MED ORDER — SODIUM BICARBONATE 650 MG PO TABS
650.0000 mg | ORAL_TABLET | Freq: Once | ORAL | Status: AC
  Administered 2021-04-12: 13:00:00 650 mg
  Filled 2021-04-12: qty 1

## 2021-04-12 NOTE — Progress Notes (Signed)
NAME:  Jeremy Mccoy, MRN:  [id number], DOB:  1965/12/16, LOS: 3 ADMISSION DATE:  03/22/2021, CONSULTATION DATE:  03/28/2021 REFERRING MD:  EDP, CHIEF COMPLAINT:  PEA arrest   History of Present Illness:  Jeremy Mccoy is a 55 y.o. M who was at home when he had a witnessed arrest. It is unknown if bystander CPR was initiated, but was in CPR when EMS arrived and was given two rounds of epinephrine and Calcium with ROSC achieved after approximately 10 minutes.   He was intubated in the ED.  Labs showed K of 7.5 and pH 6.9 with hypertension. Per report, he may have left dialysis early 11/22. CT head negative.  In the ED he was given bicarb, lokelma and insulin and nephrology was consulted.  Pertinent  Medical History  Anemia, Anxiety, Arthritis, CHF (congestive heart failure) ([place]), COPD (chronic obstructive pulmonary disease) ([place]), Crack cocaine use, Dental caries, Depression, ESRD (end stage renal disease) on dialysis ([place]), Headache(784.0), Hematemesis/vomiting blood, History of blood transfusion (10/2011; 04/2013), HTN (hypertension) (06/12/2012), Seizures ([place]), and Shortness of breath.  Significant Hospital Events: Including procedures, antibiotic start and stop dates in addition to other pertinent events   11/25 PEA arrest, intubated and PCCM admit  Interim History / Subjective:  Patient remains intubated though off sedation. Has been intermittently tachycardic with hypotension requiring restarting Levophed to maintain MAP>65.  Objective   Blood pressure (!) 84/54, pulse (!) 105, temperature 97.9 F (36.6 C), temperature source Oral, resp. rate (!) 28, height 6\' 1"  (1.854 m), weight 85.9 kg, SpO2 93 %.    Vent Mode: PRVC FiO2 (%):  [40 %-100 %] 70 % Set Rate:  [28 bmp] 28 bmp Vt Set:  [640 mL] 640 mL PEEP:  [5 cmH20-7 cmH20] 7 cmH20 Plateau Pressure:  [16 cmH20-28 cmH20] 28 cmH20   Intake/Output Summary (Last 24 hours) at 04/12/2021 0906 Last data filed at 04/12/2021 0600 Gross  per 24 hour  Intake 2546.86 ml  Output 0 ml  Net 2546.86 ml   Filed Weights   04/10/21 0430 04/11/21 0500 04/12/21 0530  Weight: 80.4 kg 85.5 kg 85.9 kg    Examination: Constitutional: Intubated, in no acute distress. Cardio: Tachycardic. Regular rhythm. No murmurs, rubs, gallops. Pulm: Clear to auscultation bilaterally. Intubated. Abdomen: Firm. MSK: No LE edema. Skin: Skin is warm and dry. Neuro: Off sedation but does not follow commands.  Resolved Hospital Problem list      Assessment & Plan:  PEA arrest Acute encephalopathy In setting of hyperkalemia, patient has history of not completing full dialysis sessions. - Neurology following, appreciate their assistance - Keppra 500 mg BID - Continuous EEG   ESRD on HD M/W/F Anion gap metabolic acidosis - HD per nephology - Trend BMP - Lokelma   COPD - Duonebs PRN   History of atrial fibrillation/flutter - Transition home Eliquis when able - Heparin gtt - Hold home metoprolol and hydralazine  Best Practice (right click and "Reselect all SmartList Selections" daily)   Diet/type: tubefeeds DVT prophylaxis: prophylactic heparin  GI prophylaxis: PPI Lines: N/A Foley:  N/A Code Status:  full code Last date of multidisciplinary goals of care discussion [11/25]  Labs   CBC: Recent Labs  Lab 03/19/2021 0122 03/31/2021 0134 03/27/2021 0647 04/06/2021 1154 04/10/21 0222 04/10/21 1119 04/10/21 1925 04/10/21 2054 04/11/21 0359 04/12/21 0227  WBC 10.7*  --  11.7*  --  10.2  --   --   --  5.0 3.1*  NEUTROABS 7.2  --   --   --   --   --   --   --   --   --  HGB 12.5*   < > 11.6*   < > 10.4* 9.9* 10.5* 9.9* 9.3* 9.7*  HCT 43.0   < > 36.9*   < > 32.0* 29.0* 31.0* 29.0* 29.2* 30.8*  MCV 100.2*  --  94.6  --  91.7  --   --   --  92.1 92.8  PLT 217  --  161  --  126*  --   --   --  94* 82*   < > = values in this interval not displayed.    Basic Metabolic Panel: Recent Labs  Lab 04/03/2021 0312 03/27/2021 0356  03/28/2021 0647 03/19/2021 0947 04/10/21 0752 04/10/21 1119 04/10/21 1638 04/10/21 1925 04/10/21 2054 04/10/21 2255 04/11/21 0359 04/12/21 0227  NA 135   < >  --    < > 132*   < > 131* 131* 129* 129* 130* 125*  K >7.5*   < >  --    < > 4.7   < > 4.6 4.6 4.7 4.9 4.9 5.0  CL 93*  --   --    < > 92*  --  91*  --   --  91* 92* 87*  CO2 16*  --   --    < > 20*  --  21*  --   --  19* 18* 15*  GLUCOSE 128*  --   --    < > 120*  --  120*  --   --  118* 104* 93  BUN 114*  --   --    < > 82*  --  90*  --   --  95* 95* 118*  CREATININE 14.79*  --   --    < > 11.05*  --  11.50*  --   --  11.97* 11.86* 12.97*  CALCIUM 10.4*  --   --    < > 7.7*  --  8.1*  --   --  7.8* 7.9* 8.0*  MG 3.1*  --  2.7*  --   --   --   --   --   --   --   --   --   PHOS  --   --   --   --   --   --   --   --   --   --   --  11.3*   < > = values in this interval not displayed.   GFR: Estimated Creatinine Clearance: 7.3 mL/min (A) (by C-G formula based on SCr of 12.97 mg/dL (H)). Recent Labs  Lab 04/12/2021 0312 03/18/2021 0647 04/10/21 0222 04/10/21 0752 04/10/21 1010 04/11/21 0359 04/12/21 0227  WBC  --  11.7* 10.2  --   --  5.0 3.1*  LATICACIDVEN 7.2*  --   --  2.0* 1.7  --   --     Liver Function Tests: Recent Labs  Lab 03/18/2021 0312 04/12/21 0227  AST 31  --   ALT 21  --   ALKPHOS 107  --   BILITOT 0.9  --   PROT 7.6  --   ALBUMIN 3.7 2.4*   No results for input(s): LIPASE, AMYLASE in the last 168 hours. Recent Labs  Lab 04/08/2021 0312  AMMONIA 44*    ABG    Component Value Date/Time   PHART 7.338 (L) 04/10/2021 2054   PCO2ART 42.4 04/10/2021 2054   PO2ART 118 (H) 04/10/2021 2054   HCO3 22.7 04/10/2021 2054   TCO2 24 04/10/2021 2054  ACIDBASEDEF 3.0 (H) 04/10/2021 2054   O2SAT 98.0 04/10/2021 2054     Coagulation Profile: No results for input(s): INR, PROTIME in the last 168 hours.  Cardiac Enzymes: No results for input(s): CKTOTAL, CKMB, CKMBINDEX, TROPONINI in the last 168  hours.  HbA1C: Hgb A1c MFr Bld  Date/Time Value Ref Range Status  12/19/2020 04:59 AM 5.3 4.8 - 5.6 % Final    Comment:    (NOTE) Pre diabetes:          5.7%-6.4%  Diabetes:              >6.4%  Glycemic control for   <7.0% adults with diabetes   03/27/2020 02:55 AM 5.1 4.8 - 5.6 % Final    Comment:    (NOTE) Pre diabetes:          5.7%-6.4%  Diabetes:              >6.4%  Glycemic control for   <7.0% adults with diabetes     CBG: Recent Labs  Lab 04/11/21 1508 04/11/21 2002 04/12/21 0023 04/12/21 0408 04/12/21 0712  GLUCAP 76 104* 89 88 90    Review of Systems:   Review of Systems  Unable to perform ROS: Intubated    Past Medical History:  He,  has a past medical history of Anemia, Anxiety, Arthritis, CHF (congestive heart failure) ([place]), COPD (chronic obstructive pulmonary disease) ([place]), Crack cocaine use, Dental caries, Depression, ESRD (end stage renal disease) on dialysis ([place]), Headache(784.0), Hematemesis/vomiting blood, History of blood transfusion (10/2011; 04/2013), HTN (hypertension) (06/12/2012), Seizures ([place]), and Shortness of breath.   Surgical History:   Past Surgical History:  Procedure Laterality Date   AV FISTULA PLACEMENT  11/29/2011   Procedure: ARTERIOVENOUS (AV) FISTULA CREATION;  Surgeon: Angelia Mould, MD;  Location: [place] [facility];  Service: Vascular;  Laterality: Left;   CARDIOVERSION N/A 03/31/2020   Procedure: CARDIOVERSION;  Surgeon: Lelon Perla, MD;  Location[facility] ENDOSCOPY;  Service: Cardiovascular;  Laterality: N/A;   ESOPHAGOGASTRODUODENOSCOPY N/A 05/17/2013   Procedure: ESOPHAGOGASTRODUODENOSCOPY (EGD);  Surgeon: Beryle Beams, MD;  Location: [facility] ENDOSCOPY;  Service: Endoscopy;  Laterality: N/A;   INGUINAL HERNIA REPAIR Bilateral 1967   IR FLUORO GUIDE CV LINE RIGHT  10/20/2017   IR THROMBECTOMY AV FISTULA W/THROMBOLYSIS/PTA INC/SHUNT/IMG LEFT Left 10/24/2017   IR US GUIDE VASC ACCESS LEFT  10/24/2017   IR US GUIDE VASC ACCESS  RIGHT  10/20/2017   RENAL BIOPSY  11/07/2011   TEE WITHOUT CARDIOVERSION N/A 03/31/2020   Procedure: TRANSESOPHAGEAL ECHOCARDIOGRAM (TEE);  Surgeon: Lelon Perla, MD;  Location: [facility] ENDOSCOPY;  Service: Cardiovascular;  Laterality: N/A;     Social History:   reports that he has been smoking cigars and cigarettes. He has a 50.00 pack-year smoking history. He has never used smokeless tobacco. He reports current drug use. Drugs: Marijuana and Cocaine. He reports that he does not drink alcohol.   Family History:  His family history includes Heart attack in his father.   Allergies No Known Allergies   Home Medications  Prior to Admission medications   Medication Sig Start Date End Date Taking? Authorizing Provider  acetaminophen (TYLENOL) 500 MG tablet Take 1 tablet (500 mg total) by mouth every 8 (eight) hours as needed for mild pain or fever. 6 times a day, 500 mg, 12 to 24 tabs a day, 6,000 to 12,000 mg daily for pain 08/05/16   Molt, Bethany, DO  albuterol (VENTOLIN HFA) 108 (90 Base) MCG/ACT inhaler Inhale 3-6  puffs into the lungs every 6 (six) hours as needed for wheezing or shortness of breath.     [provider]  amiodarone (PACERONE) 200 MG tablet Take 200 mg by mouth daily.    [provider]  apixaban (ELIQUIS) 5 MG TABS tablet Take 1 tablet (5 mg total) by mouth 2 (two) times daily. 01/28/21 05/22/21  Deberah Pelton, NP  clotrimazole (LOTRIMIN) 1 % cream Apply topically 2 (two) times daily as needed (itching). Back and chest 01/30/21   Alma Friendly, MD  diphenhydrAMINE (BENADRYL) 25 mg capsule Take 25 mg by mouth every 6 (six) hours as needed for itching.    [provider]  hydrALAZINE (APRESOLINE) 25 MG tablet Take 1 tablet (25 mg total) by mouth 3 (three) times daily. 05/26/20   Richardson Dopp T, PA-C  hydrocortisone cream 1 % Apply 1 application topically 2 (two) times daily as needed for itching.    [provider]  hydrOXYzine  (ATARAX/VISTARIL) 25 MG tablet Take 50 mg by mouth 3 (three) times daily as needed for itching.    [provider]  metoprolol succinate (TOPROL XL) 50 MG 24 hr tablet Take 1 tablet (50 mg total) by mouth daily. Take with or immediately following a meal. 02/04/21 02/04/22  Fenton, Clint R, PA  pantoprazole (PROTONIX) 40 MG tablet TAKE 1 TABLET (40 MG TOTAL) BY MOUTH DAILY. Patient taking differently: Take 40 mg by mouth daily. 04/01/20 05/22/21  Ezequiel Essex, MD  sevelamer carbonate (RENVELA) 800 MG tablet Take 2 tablets (1,600 mg total) by mouth 3 (three) times daily with meals. Patient taking differently: Take 3,200 mg by mouth See admin instructions. 3200mg  three times daily with meals and 800mg  with snacks 01/25/14   McLean-Scocuzza, Nino Glow, MD  sodium zirconium cyclosilicate (LOKELMA) 10 g PACK packet Take 10 g by mouth daily as needed (high potassium).    [provider]  SPIRIVA RESPIMAT 2.5 MCG/ACT AERS Take 2 puffs by mouth daily. 01/28/21   [provider]  tolnaftate (TINACTIN) 1 % spray Apply 1 application topically 2 (two) times daily as needed (itching on arms/back).    [provider]  vitamin B-12 (CYANOCOBALAMIN) 1000 MCG tablet Take 1,000 mcg by mouth daily.    [provider]     Critical care time: 35 minutes

## 2021-04-12 NOTE — Progress Notes (Signed)
Subjective:  seen in ICU, on vent and propofol, unresponsive. Bp's 90- 100's on levo gtt at 10 ug/min.    Objective Vital signs in last 24 hours: Vitals:   04/12/21 1100 04/12/21 1105 04/12/21 1115 04/12/21 1242  BP: (!) 113/59  (!) 110/50 (!) 112/48  Pulse: (!) 101  100   Resp: (!) 28  (!) 28   Temp:  98.8 F (37.1 C)    TempSrc:  Oral    SpO2: 95%  95% 96%  Weight:      Height:       Weight change: 0.4 kg  Intake/Output Summary (Last 24 hours) at 04/12/2021 1251 Last data filed at 04/12/2021 1100 Gross per 24 hour  Intake 2512.68 ml  Output 0 ml  Net 2512.68 ml     OP HD: AF MWF   4h  400   1K bath  AVF  Heparin ?   68kg  -  hectorol 2  - parsabiv 15   Assessment/Plan: 1. S/p cardiac arrest- outside the hospital-  down for at least 10 minutes.  Now with possible evidence of anoxic brain injury with sz activity -  on keppra and propofol IV, neuro following 2. ESRD -  normally MWF - notoriously non compliant-  signs off early most treatments and has been doing for years. For HD today. 3. Hyperkalemia-  chronic issue- runs on 1 K bath as OP. HD today. K 5.0 3. Anemia-  not too much of a concern  4. Secondary hyperparathyroidism - calcium OK-  no treatment for now 5. HTN/volume-  is 9 L + since arrival and 12 kg up by wt, mod edema and new IS by CXR. Unfortunately w/ hypotension will not be able to get much fluid off.  6.  SZ-   concerning for anoxic brain injury-  first MRI was negative-  cont to follow exam and neuro on board   Kelly Splinter, MD 04/12/2021, 12:55 PM       Labs: Basic Metabolic Panel: Recent Labs  Lab 04/10/21 2255 04/11/21 0359 04/12/21 0227  NA 129* 130* 125*  K 4.9 4.9 5.0  CL 91* 92* 87*  CO2 19* 18* 15*  GLUCOSE 118* 104* 93  BUN 95* 95* 118*  CREATININE 11.97* 11.86* 12.97*  CALCIUM 7.8* 7.9* 8.0*  PHOS  --   --  11.3*    Liver Function Tests: Recent Labs  Lab 03/17/2021 0312 04/12/21 0227  AST 31  --   ALT 21  --   ALKPHOS  107  --   BILITOT 0.9  --   PROT 7.6  --   ALBUMIN 3.7 2.4*    No results for input(s): LIPASE, AMYLASE in the last 168 hours. Recent Labs  Lab 04/03/2021 0312  AMMONIA 44*    CBC: Recent Labs  Lab 04/10/2021 0122 03/20/2021 0134 03/30/2021 0647 03/31/2021 1154 04/10/21 0222 04/10/21 1119 04/10/21 2054 04/11/21 0359 04/12/21 0227  WBC 10.7*  --  11.7*  --  10.2  --   --  5.0 3.1*  NEUTROABS 7.2  --   --   --   --   --   --   --   --   HGB 12.5*   < > 11.6*   < > 10.4*   < > 9.9* 9.3* 9.7*  HCT 43.0   < > 36.9*   < > 32.0*   < > 29.0* 29.2* 30.8*  MCV 100.2*  --  94.6  --  91.7  --   --  92.1 92.8  PLT 217  --  161  --  126*  --   --  94* 82*   < > = values in this interval not displayed.    Cardiac Enzymes: No results for input(s): CKTOTAL, CKMB, CKMBINDEX, TROPONINI in the last 168 hours. CBG: Recent Labs  Lab 04/11/21 2002 04/12/21 0023 04/12/21 0408 04/12/21 0712 04/12/21 1103  GLUCAP 104* 89 88 90 97     Iron Studies: No results for input(s): IRON, TIBC, TRANSFERRIN, FERRITIN in the last 72 hours. Studies/Results: ECHOCARDIOGRAM COMPLETE  Result Date: 04/10/2021    ECHOCARDIOGRAM REPORT   Patient Name:   Jeremy Mccoy [facility] Date of Exam: 04/10/2021 Medical Rec #:  [id number]        Height:       73.0 in Accession #:    [id number]       Weight:       177.2 lb Date of Birth:  Dec 03, 1965        BSA:          2.044 m Patient Age:    71 years         BP:           99/88 mmHg Patient Gender: M                HR:           104 bpm. Exam Location:  Inpatient Procedure: 2D Echo, Cardiac Doppler and Color Doppler Indications:     Acute MI  History:         Patient has prior history of Echocardiogram examinations, most                  recent 12/19/2020. COPD, Signs/Symptoms:Syncope; Risk                  Factors:Hypertension.  Sonographer:     Glo Herring Referring Phys:  [id number] Renee Pain Diagnosing Phys: Oswaldo Milian MD IMPRESSIONS  1. Left ventricular ejection  fraction, by estimation, is 60 to 65%. The left ventricle has normal function. The left ventricle has no regional wall motion abnormalities. There is mild left ventricular hypertrophy. Left ventricular diastolic parameters are indeterminate.  2. Right ventricular systolic function is mildly reduced. The right ventricular size is mildly enlarged. There is moderately elevated pulmonary artery systolic pressure. The estimated right ventricular systolic pressure is 35.7 mmHg.  3. Left atrial size was moderately dilated.  4. The mitral valve is abnormal. No evidence of mitral valve regurgitation. Severe mitral stenosis. MVA 1.4 cm^2 by continuity equation. MG 12 mmHg at 104 bpm. Severe mitral annular calcification.  5. The aortic valve is tricuspid. Aortic valve regurgitation is not visualized. Aortic valve sclerosis is present, with no evidence of aortic valve stenosis.  6. The inferior vena cava is dilated in size with <50% respiratory variability, suggesting right atrial pressure of 15 mmHg. FINDINGS  Left Ventricle: Left ventricular ejection fraction, by estimation, is 60 to 65%. The left ventricle has normal function. The left ventricle has no regional wall motion abnormalities. The left ventricular internal cavity size was normal in size. There is  mild left ventricular hypertrophy. Left ventricular diastolic parameters are indeterminate. Right Ventricle: The right ventricular size is mildly enlarged. Right vetricular wall thickness was not well visualized. Right ventricular systolic function is mildly reduced. There is moderately elevated pulmonary artery systolic pressure. The tricuspid  regurgitant velocity is 2.98 m/s, and with an assumed right atrial pressure of 15 mmHg, the  estimated right ventricular systolic pressure is 64.4 mmHg. Left Atrium: Left atrial size was moderately dilated. Right Atrium: Right atrial size was normal in size. Pericardium: There is no evidence of pericardial effusion. Mitral Valve:  The mitral valve is abnormal. Severe mitral annular calcification. No evidence of mitral valve regurgitation. Severe mitral valve stenosis. MV peak gradient, 16.9 mmHg. The mean mitral valve gradient is 10.5 mmHg. Tricuspid Valve: The tricuspid valve is normal in structure. Tricuspid valve regurgitation is mild. Aortic Valve: The aortic valve is tricuspid. Aortic valve regurgitation is not visualized. Aortic valve sclerosis is present, with no evidence of aortic valve stenosis. Aortic valve mean gradient measures 7.0 mmHg. Aortic valve peak gradient measures 11.7 mmHg. Aortic valve area, by VTI measures 2.29 cm. Pulmonic Valve: The pulmonic valve was grossly normal. Pulmonic valve regurgitation is not visualized. Aorta: The aortic root and ascending aorta are structurally normal, with no evidence of dilitation. Venous: The inferior vena cava is dilated in size with less than 50% respiratory variability, suggesting right atrial pressure of 15 mmHg. IAS/Shunts: The interatrial septum was not well visualized.  LEFT VENTRICLE PLAX 2D LVIDd:         4.30 cm LVIDs:         2.80 cm LV PW:         1.30 cm LV IVS:        1.30 cm LVOT diam:     2.00 cm LV SV:         63 LV SV Index:   31 LVOT Area:     3.14 cm  LV Volumes (MOD) LV vol d, MOD A2C: 126.0 ml LV vol d, MOD A4C: 137.0 ml LV vol s, MOD A2C: 55.6 ml LV vol s, MOD A4C: 54.6 ml LV SV MOD A2C:     70.4 ml LV SV MOD A4C:     137.0 ml LV SV MOD BP:      75.5 ml RIGHT VENTRICLE            IVC RV Basal diam:  4.00 cm    IVC diam: 2.30 cm RV S prime:     9.36 cm/s LEFT ATRIUM           Index        RIGHT ATRIUM           Index LA diam:      4.80 cm 2.35 cm/m   RA Area:     18.70 cm LA Vol (A4C): 90.9 ml 44.47 ml/m  RA Volume:   48.60 ml  23.78 ml/m  AORTIC VALVE                     PULMONIC VALVE AV Area (Vmax):    2.22 cm      PV Vmax:       1.06 m/s AV Area (Vmean):   2.13 cm      PV Peak grad:  4.5 mmHg AV Area (VTI):     2.29 cm AV Vmax:           171.00 cm/s  AV Vmean:          126.000 cm/s AV VTI:            0.274 m AV Peak Grad:      11.7 mmHg AV Mean Grad:      7.0 mmHg LVOT Vmax:         121.00 cm/s LVOT Vmean:  85.300 cm/s LVOT VTI:          0.200 m LVOT/AV VTI ratio: 0.73  AORTA Ao Root diam: 3.20 cm Ao Asc diam:  2.60 cm MITRAL VALVE              TRICUSPID VALVE MV Area VTI:  1.41 cm    TR Peak grad:   35.5 mmHg MV Peak grad: 16.9 mmHg   TR Vmax:        298.00 cm/s MV Mean grad: 10.5 mmHg MV Vmax:      2.06 m/s    SHUNTS MV Vmean:     154.5 cm/s  Systemic VTI:  0.20 m                           Systemic Diam: 2.00 cm Oswaldo Milian MD Electronically signed by Oswaldo Milian MD Signature Date/Time: 04/10/2021/4:46:30 PM    Final (Updated)    Medications: Infusions:  sodium chloride Stopped (04/10/21 0953)   feeding supplement (NEPRO CARB STEADY) 1,000 mL (04/12/21 0602)   fentaNYL infusion INTRAVENOUS Stopped (03/24/2021 1100)   levETIRAcetam Stopped (04/12/21 0924)   midazolam (VERSED) infusion Stopped (04/12/21 0456)   norepinephrine (LEVOPHED) Adult infusion 10 mcg/min (04/12/21 1100)   propofol (DIPRIVAN) infusion 50 mcg/kg/min (04/12/21 1100)    Scheduled Medications:  budesonide (PULMICORT) nebulizer solution  0.5 mg Nebulization BID   chlorhexidine gluconate (MEDLINE KIT)  15 mL Mouth Rinse BID   Chlorhexidine Gluconate Cloth  6 each Topical Q0600   Chlorhexidine Gluconate Cloth  6 each Topical Q0600   docusate  100 mg Per Tube BID   feeding supplement (PROSource TF)  45 mL Per Tube TID   folic acid  1 mg Intravenous Daily   heparin injection (subcutaneous)  5,000 Units Subcutaneous Q8H   ipratropium-albuterol  3 mL Nebulization Q6H   mouth rinse  15 mL Mouth Rinse 10 times per day   midodrine  10 mg Oral TID WC   multivitamin  1 tablet Per Tube QHS   pantoprazole (PROTONIX) IV  40 mg Intravenous QHS   polyethylene glycol  17 g Per Tube Daily   sodium zirconium cyclosilicate  10 g Per Tube TID   thiamine injection   100 mg Intravenous Daily    have reviewed scheduled and prn medications.  Physical Exam: General: unresponsive on vent and sedation Heart: tachy Lungs: mostly clear Abdomen: soft, non tender Extremities: no edema Dialysis Access: left AVF-  patent     04/12/2021,12:51 PM  LOS: 3 days

## 2021-04-12 NOTE — Progress Notes (Signed)
Subjective: No clinical seizures.  ROS: Unable to obtain due to poor mental status  Examination  Vital signs in last 24 hours: Temp:  [97.7 F (36.5 C)-102.7 F (39.3 C)] 97.9 F (36.6 C) (11/28 0713) Pulse Rate:  [100-132] 105 (11/28 0600) Resp:  [28-30] 28 (11/28 0600) BP: (72-144)/(49-75) 84/54 (11/28 0600) SpO2:  [91 %-100 %] 93 % (11/28 0600) FiO2 (%):  [40 %-100 %] 70 % (11/28 0751) Weight:  [85.9 kg] 85.9 kg (11/28 0530)  General: lying in bed, NAD CVS: pulse-normal rate and rhythm RS: Intubated, coarse breath sounds bilaterally Extremities: normal  Neuro: On propofol at 50 MCG per hour, comatose, does not open eyes noxious stimuli, PERRLA, corneal reflex absent, gag reflex absent, does not withdraw to noxious stimuli in all 4 extremities  Basic Metabolic Panel: Recent Labs  Lab 03/22/2021 0312 04/05/2021 0356 04/05/2021 0647 03/26/2021 0947 04/10/21 0752 04/10/21 1119 04/10/21 1638 04/10/21 1925 04/10/21 2054 04/10/21 2255 04/11/21 0359 04/12/21 0227  NA 135   < >  --    < > 132*   < > 131* 131* 129* 129* 130* 125*  K >7.5*   < >  --    < > 4.7   < > 4.6 4.6 4.7 4.9 4.9 5.0  CL 93*  --   --    < > 92*  --  91*  --   --  91* 92* 87*  CO2 16*  --   --    < > 20*  --  21*  --   --  19* 18* 15*  GLUCOSE 128*  --   --    < > 120*  --  120*  --   --  118* 104* 93  BUN 114*  --   --    < > 82*  --  90*  --   --  95* 95* 118*  CREATININE 14.79*  --   --    < > 11.05*  --  11.50*  --   --  11.97* 11.86* 12.97*  CALCIUM 10.4*  --   --    < > 7.7*  --  8.1*  --   --  7.8* 7.9* 8.0*  MG 3.1*  --  2.7*  --   --   --   --   --   --   --   --   --   PHOS  --   --   --   --   --   --   --   --   --   --   --  11.3*   < > = values in this interval not displayed.    CBC: Recent Labs  Lab 04/11/2021 0122 04/03/2021 0134 03/19/2021 0647 03/23/2021 1154 04/10/21 0222 04/10/21 1119 04/10/21 1925 04/10/21 2054 04/11/21 0359 04/12/21 0227  WBC 10.7*  --  11.7*  --  10.2  --   --   --   5.0 3.1*  NEUTROABS 7.2  --   --   --   --   --   --   --   --   --   HGB 12.5*   < > 11.6*   < > 10.4* 9.9* 10.5* 9.9* 9.3* 9.7*  HCT 43.0   < > 36.9*   < > 32.0* 29.0* 31.0* 29.0* 29.2* 30.8*  MCV 100.2*  --  94.6  --  91.7  --   --   --  92.1 92.8  PLT 217  --  161  --  126*  --   --   --  94* 82*   < > = values in this interval not displayed.     Coagulation Studies: No results for input(s): LABPROT, INR in the last 72 hours.  Imaging No new brain imaging overnight  ASSESSMENT AND PLAN: 69 old male status post cardiac arrest, 10 minutes to ROSC now with myoclonic status epilepticus.   Cardiac arrest Suspect anoxic/anoxic brain injury Myoclonic status epilepticus ( resolved) -Routine EEG showed status epilepticus   Recommendations: -No further seizures overnight, recommend weaning propofol at 10 MCG per hour to stop. -Continue Keppra 500 mg twice daily -Myoclonic status epilepticus within 24 hours cardiac arrest, EEG with suppression and not reacting to noxious stimulation or both indicative of significant neurological injury even though MRI brain does not show any evidence of anoxic/hypoxic brain injury. - If seizures recur when we attempt to wean sedation, this would be suggestive of significant neurological injury. -Management of rest of comorbidities per primary team    Thank you for allowing [place] to participate in the care of this patient. If you have any further questions, please contact  me or neurohospitalist.      CRITICAL CARE Performed by: Lora Havens     Total critical care time: 35 minutes   Critical care time was exclusive of separately billable procedures and treating other patients.   Critical care was necessary to treat or prevent imminent or life-threatening deterioration.   Critical care was time spent personally by me on the following activities: development of treatment plan with patient and/or surrogate as well as nursing, discussions with  consultants, evaluation of patient's response to treatment, examination of patient, obtaining history from patient or surrogate, ordering and performing treatments and interventions, ordering and review of laboratory studies, ordering and review of radiographic studies, pulse oximetry and re-evaluation of patient's condition.    Zeb Comfort Epilepsy [facility] For questions after 5pm please refer to AMION to reach the Neurologist on call

## 2021-04-12 NOTE — Progress Notes (Addendum)
Patient with tremors to chest, lips, and both hands, Continue to wean Propofol per neurology.. Neuro notified and looked at EEG and not sx activity noted. PRN dose of lorazepam given to see if will help with tremors. HR increase  also 130's and BP increase. MD made aware of findings. Will continue to tritrate levo.

## 2021-04-12 NOTE — Progress Notes (Incomplete)
Hypoglycemic Event  CBG: 69  Treatment: D50 25 mL (12.5 gm)  Symptoms: Shaky  Follow-up CBG: Time:*** CBG Result:***  Possible Reasons for Event: {Possible Reasons for GVSYV:[id number]}  Comments/MD notified:***    Rainey Pines

## 2021-04-12 NOTE — Progress Notes (Addendum)
[place] Progress Note Patient Name: HONG MORING DOB: 1965-10-24 MRN: [id number]   Date of Service  04/12/2021  HPI/Events of Note  Patient with continued myoclonic jerking movements, with tachycardia. Had a CT head done, result pending. In addition, RN also notes the ETT had to be advanced in and has also had borderline low glucose levels. Seen on camera. Not waking up, continues to have jerks. Was taken off propofol and no plans to resume, per neurology. Still has fentanyl drip ordered in MAR.   eICU Interventions  Start with PRN fentanyl, Trying to avoid continuous sedation as much as we can Obtain CXR Add D5 ns  Overall very poor prognosis      Intervention Category Major Interventions: Respiratory failure - evaluation and management;Delirium, psychosis, severe agitation - evaluation and management  Margaretmary Lombard 04/12/2021, 8:17 PM  Addendum 10:15 pm CXR pending in system Patient with ventilator synchrony issues and looks uncomfortable, though no change In mental status Add precedex to see if this helps  Addendum at 11 pm Notified issue with transferring CXR to this system, radiology had already interpreted but this is also not seen in system ETT 5.5 cm above carina, right lung atelectasis versus infiltrate No fever, no purulent secretions, no elevation in WBC, O2 sat stable on 50% fio2 If increase in fio2 noted, we will repeat an image   Addendum at 11:45 pm Severe hypoglycemia Add D10  Glucose checks hourly for now   Addendum at 12:40 am Called for hypotension and worsening hypoxia RT did an ABG and adjusted vent settings Fio2 to 80, peep to 8, RR is 32 - o2 sat is 93 CXR has come up in the system now RN also let me know that patient has ended up on levophed via peripheral iv line Has 3 IV lines in right arm, left arm has fistula  Albumin x 1 also to be tried I had ordered vanc and zosyn as well I called and spoke with patient's sister Jackelyn Poling at  [phone] I discussed events with her She has also spoken with the other brother , Aaron Edelman They have decided on DNR status I told her that his condition may decline rapidly and he may not survive the next few hours as well They are aware and agree that at this time, aggressive interventions will not change the outcome  Patient Is very ill and do not think placement of lines to carry on additional measures will work either Jackelyn Poling is in agreement I called and discussed with bedside RN Sam, who will confirm this on a separate phone call  Notified ground team as well  Addendum 2 am Retrospective note entry Patient max on levophed via peripheral line D/w ground CCM team , they will evaluate patient and speak with family about central line placement

## 2021-04-12 NOTE — Progress Notes (Signed)
Patient transported to CT and back to 2M06 without any apparent complications.

## 2021-04-12 NOTE — Procedures (Addendum)
Patient Name: Jeremy Mccoy  MRN: [id number]  Epilepsy Attending: Lora Havens  Referring Physician/Provider: Dr Farrel Gordon Duration: 04/11/2021 1205 to 04/12/2021 1205   Patient history: 25 old male status post cardiac arrest.  EEG evaluate for seizures.   Level of alertness:  comatose   AEDs during EEG study: Propofol, LEV   Technical aspects: This EEG study was done with scalp electrodes positioned according to the 10-20 International system of electrode placement. Electrical activity was acquired at a sampling rate of 500Hz  and reviewed with a high frequency filter of 70Hz  and a low frequency filter of 1Hz . EEG data were recorded continuously and digitally stored.    Description: EEG showed burst suppression pattern with bursts of generalized spikes admixed with low amplitude 3 to 5 Hz sharply contoured theta-delta slowing lasting 2 to 3 seconds alternating with EEG attenuation lasting 15 to 30 seconds.   ABNORMALITY -Burst suppression, generalized -Spike, generalized   IMPRESSION: This study showed evidence of epileptogenicity with generalized onset as well as profound diffuse encephalopathy.  No seizures were seen during the study.  With history of cardiac arrest, this EEG is most likely due to anoxic/hypoxic brain injury.    Fredrica Capano Barbra Sarks

## 2021-04-12 NOTE — Progress Notes (Signed)
Hypoglycemic Event  CBG: 66  Treatment: D50 25 mL (12.5 gm)  Symptoms: Shaky  Follow-up CBG: Time:1600 CBG Result:69  Possible Reasons for Event: Inadequate meal intake Pt was NPO due to tube clogging up  Comments/MD notified:Dean     Truman Hayward, Anabel Bene

## 2021-04-12 NOTE — Progress Notes (Signed)
LTM maintained. Impedance=good. No skin breakdown noted. Results pending.

## 2021-04-12 NOTE — Progress Notes (Signed)
Tremors stop with Ativan push and then restarted 5 minutes later

## 2021-04-12 NOTE — Progress Notes (Signed)
Versed wasted 250 ml with witness Melany Guernsey RN

## 2021-04-13 DIAGNOSIS — G40901 Epilepsy, unspecified, not intractable, with status epilepticus: Secondary | ICD-10-CM | POA: Diagnosis not present

## 2021-04-13 DIAGNOSIS — J9602 Acute respiratory failure with hypercapnia: Secondary | ICD-10-CM | POA: Diagnosis not present

## 2021-04-13 DIAGNOSIS — G931 Anoxic brain damage, not elsewhere classified: Secondary | ICD-10-CM | POA: Diagnosis not present

## 2021-04-13 DIAGNOSIS — I469 Cardiac arrest, cause unspecified: Secondary | ICD-10-CM | POA: Diagnosis not present

## 2021-04-13 LAB — POCT I-STAT 7, (LYTES, BLD GAS, ICA,H+H)
Acid-base deficit: 10 mmol/L — ABNORMAL HIGH (ref 0.0–2.0)
Acid-base deficit: 8 mmol/L — ABNORMAL HIGH (ref 0.0–2.0)
Acid-base deficit: 9 mmol/L — ABNORMAL HIGH (ref 0.0–2.0)
Bicarbonate: 18.1 mmol/L — ABNORMAL LOW (ref 20.0–28.0)
Bicarbonate: 18.5 mmol/L — ABNORMAL LOW (ref 20.0–28.0)
Bicarbonate: 18.8 mmol/L — ABNORMAL LOW (ref 20.0–28.0)
Calcium, Ion: 0.99 mmol/L — ABNORMAL LOW (ref 1.15–1.40)
Calcium, Ion: 1 mmol/L — ABNORMAL LOW (ref 1.15–1.40)
Calcium, Ion: 1.01 mmol/L — ABNORMAL LOW (ref 1.15–1.40)
HCT: 30 % — ABNORMAL LOW (ref 39.0–52.0)
HCT: 32 % — ABNORMAL LOW (ref 39.0–52.0)
HCT: 32 % — ABNORMAL LOW (ref 39.0–52.0)
Hemoglobin: 10.2 g/dL — ABNORMAL LOW (ref 13.0–17.0)
Hemoglobin: 10.9 g/dL — ABNORMAL LOW (ref 13.0–17.0)
Hemoglobin: 10.9 g/dL — ABNORMAL LOW (ref 13.0–17.0)
O2 Saturation: 64 %
O2 Saturation: 89 %
O2 Saturation: 92 %
Patient temperature: 98.8
Patient temperature: 98.8
Patient temperature: 98.8
Potassium: 5.4 mmol/L — ABNORMAL HIGH (ref 3.5–5.1)
Potassium: 5.9 mmol/L — ABNORMAL HIGH (ref 3.5–5.1)
Potassium: 5.9 mmol/L — ABNORMAL HIGH (ref 3.5–5.1)
Sodium: 128 mmol/L — ABNORMAL LOW (ref 135–145)
Sodium: 129 mmol/L — ABNORMAL LOW (ref 135–145)
Sodium: 129 mmol/L — ABNORMAL LOW (ref 135–145)
TCO2: 20 mmol/L — ABNORMAL LOW (ref 22–32)
TCO2: 20 mmol/L — ABNORMAL LOW (ref 22–32)
TCO2: 20 mmol/L — ABNORMAL LOW (ref 22–32)
pCO2 arterial: 43.4 mmHg (ref 32.0–48.0)
pCO2 arterial: 50.2 mmHg — ABNORMAL HIGH (ref 32.0–48.0)
pCO2 arterial: 50.3 mmHg — ABNORMAL HIGH (ref 32.0–48.0)
pH, Arterial: 7.166 — CL (ref 7.350–7.450)
pH, Arterial: 7.182 — CL (ref 7.350–7.450)
pH, Arterial: 7.238 — ABNORMAL LOW (ref 7.350–7.450)
pO2, Arterial: 39 mmHg — CL (ref 83.0–108.0)
pO2, Arterial: 73 mmHg — ABNORMAL LOW (ref 83.0–108.0)
pO2, Arterial: 80 mmHg — ABNORMAL LOW (ref 83.0–108.0)

## 2021-04-13 LAB — GLUCOSE, CAPILLARY
Glucose-Capillary: 104 mg/dL — ABNORMAL HIGH (ref 70–99)
Glucose-Capillary: 104 mg/dL — ABNORMAL HIGH (ref 70–99)
Glucose-Capillary: 109 mg/dL — ABNORMAL HIGH (ref 70–99)
Glucose-Capillary: 28 mg/dL — CL (ref 70–99)
Glucose-Capillary: 78 mg/dL (ref 70–99)
Glucose-Capillary: 81 mg/dL (ref 70–99)
Glucose-Capillary: 82 mg/dL (ref 70–99)
Glucose-Capillary: 93 mg/dL (ref 70–99)

## 2021-04-13 LAB — RENAL FUNCTION PANEL
Albumin: 2.4 g/dL — ABNORMAL LOW (ref 3.5–5.0)
Anion gap: 23 — ABNORMAL HIGH (ref 5–15)
BUN: 79 mg/dL — ABNORMAL HIGH (ref 6–20)
CO2: 17 mmol/L — ABNORMAL LOW (ref 22–32)
Calcium: 8 mg/dL — ABNORMAL LOW (ref 8.9–10.3)
Chloride: 90 mmol/L — ABNORMAL LOW (ref 98–111)
Creatinine, Ser: 9.19 mg/dL — ABNORMAL HIGH (ref 0.61–1.24)
GFR, Estimated: 6 mL/min — ABNORMAL LOW (ref >60)
Glucose, Bld: 106 mg/dL — ABNORMAL HIGH (ref 70–99)
Phosphorus: >30 mg/dL — ABNORMAL HIGH (ref 2.5–4.6)
Potassium: 5.5 mmol/L — ABNORMAL HIGH (ref 3.5–5.1)
Sodium: 130 mmol/L — ABNORMAL LOW (ref 135–145)

## 2021-04-13 LAB — CBC
HCT: 31.8 % — ABNORMAL LOW (ref 39.0–52.0)
Hemoglobin: 10.2 g/dL — ABNORMAL LOW (ref 13.0–17.0)
MCH: 29.4 pg (ref 26.0–34.0)
MCHC: 32.1 g/dL (ref 30.0–36.0)
MCV: 91.6 fL (ref 80.0–100.0)
Platelets: 100 10*3/uL — ABNORMAL LOW (ref 150–400)
RBC: 3.47 MIL/uL — ABNORMAL LOW (ref 4.22–5.81)
RDW: 19.3 % — ABNORMAL HIGH (ref 11.5–15.5)
WBC: 3.3 10*3/uL — ABNORMAL LOW (ref 4.0–10.5)
nRBC: 0.6 % — ABNORMAL HIGH (ref 0.0–0.2)

## 2021-04-13 MED ORDER — LORAZEPAM 2 MG/ML IJ SOLN
2.0000 mg | INTRAMUSCULAR | Status: DC | PRN
  Administered 2021-04-13: 2 mg via INTRAVENOUS

## 2021-04-13 MED ORDER — ALBUMIN HUMAN 25 % IV SOLN
INTRAVENOUS | Status: AC
  Filled 2021-04-13: qty 50

## 2021-04-13 MED ORDER — MORPHINE SULFATE (PF) 2 MG/ML IV SOLN
2.0000 mg | INTRAVENOUS | Status: DC | PRN
  Administered 2021-04-13: 2 mg via INTRAVENOUS

## 2021-04-13 MED ORDER — PHENYLEPHRINE HCL-NACL 20-0.9 MG/250ML-% IV SOLN
INTRAVENOUS | Status: AC
  Filled 2021-04-13: qty 250

## 2021-04-13 MED ORDER — VALPROATE SODIUM 250 MG/5ML PO SOLN
ORAL | Status: AC
  Filled 2021-04-13: qty 5

## 2021-04-13 MED ORDER — PHENYLEPHRINE HCL-NACL 20-0.9 MG/250ML-% IV SOLN
25.0000 ug/min | INTRAVENOUS | Status: DC
  Administered 2021-04-13 (×2): 100 ug/min via INTRAVENOUS
  Administered 2021-04-13: 125 ug/min via INTRAVENOUS
  Administered 2021-04-13: 100 ug/min via INTRAVENOUS

## 2021-04-13 MED ORDER — SODIUM CHLORIDE 0.9 % IV SOLN
250.0000 mL | INTRAVENOUS | Status: DC
  Administered 2021-04-13: 250 mL via INTRAVENOUS

## 2021-04-13 MED ORDER — PIPERACILLIN-TAZOBACTAM IN DEX 2-0.25 GM/50ML IV SOLN
2.2500 g | Freq: Three times a day (TID) | INTRAVENOUS | Status: DC
  Administered 2021-04-13 (×2): 2.25 g via INTRAVENOUS
  Filled 2021-04-13 (×4): qty 50

## 2021-04-13 MED ORDER — DEXMEDETOMIDINE HCL IN NACL 400 MCG/100ML IV SOLN
INTRAVENOUS | Status: AC
  Filled 2021-04-13: qty 100

## 2021-04-13 MED ORDER — MORPHINE SULFATE (PF) 2 MG/ML IV SOLN
INTRAVENOUS | Status: AC
  Filled 2021-04-13: qty 1

## 2021-04-13 MED ORDER — PHENYLEPHRINE HCL-NACL 20-0.9 MG/250ML-% IV SOLN: 0.0000 ug/min | INTRAVENOUS | Status: DC

## 2021-04-13 MED ORDER — VANCOMYCIN HCL 1750 MG/350ML IV SOLN
1750.0000 mg | Freq: Once | INTRAVENOUS | Status: AC
  Administered 2021-04-13: 1750 mg via INTRAVENOUS
  Filled 2021-04-13: qty 350

## 2021-04-13 MED ORDER — ALBUMIN HUMAN 25 % IV SOLN
12.5000 g | Freq: Once | INTRAVENOUS | Status: AC
  Administered 2021-04-13: 12.5 g via INTRAVENOUS

## 2021-04-13 MED ORDER — VANCOMYCIN HCL IN DEXTROSE 1-5 GM/200ML-% IV SOLN: 1000.0000 mg | INTRAVENOUS | Status: DC

## 2021-04-14 ENCOUNTER — Ambulatory Visit: Payer: Self-pay | Admitting: Cardiology

## 2021-04-14 LAB — CULTURE, BLOOD (ROUTINE X 2)
Culture: NO GROWTH
Culture: NO GROWTH
Special Requests: ADEQUATE
Special Requests: ADEQUATE

## 2021-04-14 LAB — GLUCOSE, CAPILLARY: Glucose-Capillary: 27 mg/dL — CL (ref 70–99)

## 2021-04-15 NOTE — Progress Notes (Signed)
Subjective:  seen in ICU, not responding   Objective Vital signs in last 24 hours: Vitals:   04/12/2021 0900 03/20/2021 1000 04/06/2021 1100 03/20/2021 1121  BP: (!) 76/52 (!) 72/44 (!) 77/51   Pulse: (!) 123 (!) 122 (!) 120 (!) 120  Resp: (!) 33 (!) 29 (!) 33 (!) 33  Temp:   (!) 100.9 F (38.3 C)   TempSrc:   Axillary   SpO2: 91% 90% 90% 90%  Weight:      Height:       Weight change: 0.1 kg  Intake/Output Summary (Last 24 hours) at 03/19/2021 1151 Last data filed at 03/16/2021 1000 Gross per 24 hour  Intake 4173.44 ml  Output 1500 ml  Net 2673.44 ml     OP HD: AF MWF   4h  400   1K bath  AVF  Heparin ?   68kg  -  hectorol 2  - parsabiv 15   Assessment/Plan: 1. S/p cardiac arrest- outside the hospital-  down for at least 10 minutes.  Now with possible evidence of anoxic brain injury with sz activity -  on keppra and propofol IV. Not improving. Pt is now DNR.  Family coming to visit from out of town, per primary team. Last HD Monday. Will hold off on further HD for now as recovery seems unlikely.  2. ESRD - usual HD is MWF. See above.  3. Anemia-  not too much of a concern  4. Secondary hyperparathyroidism - calcium OK-  no treatment for now 5. HTN/volume-  is 9 L + since arrival and 12 kg up by wt, mod edema and new IS by CXR. D/t hypotension we were not be able to get much fluid off.  6.  SZ-  probable anoxic brain injury, neuro on board   Rob Schertz, MD 04/02/2021, 11:51 AM       Labs: Basic Metabolic Panel: Recent Labs  Lab 04/11/21 0359 04/12/21 0227 04/08/2021 0001 03/29/2021 0015 04/11/2021 0102 04/12/2021 0342  NA 130* 125*   < > 129* 129* 130*  K 4.9 5.0   < > 5.9* 5.4* 5.5*  CL 92* 87*  --   --   --  90*  CO2 18* 15*  --   --   --  17*  GLUCOSE 104* 93  --   --   --  106*  BUN 95* 118*  --   --   --  79*  CREATININE 11.86* 12.97*  --   --   --  9.19*  CALCIUM 7.9* 8.0*  --   --   --  8.0*  PHOS  --  11.3*  --   --   --  >30.0*   < > = values in this  interval not displayed.    Liver Function Tests: Recent Labs  Lab 03/17/2021 0312 04/12/21 0227 03/20/2021 0342  AST 31  --   --   ALT 21  --   --   ALKPHOS 107  --   --   BILITOT 0.9  --   --   PROT 7.6  --   --   ALBUMIN 3.7 2.4* 2.4*    No results for input(s): LIPASE, AMYLASE in the last 168 hours. Recent Labs  Lab 03/27/2021 0312  AMMONIA 44*    CBC: Recent Labs  Lab 03/25/2021 0122 03/30/2021 0134 03/28/2021 0647 04/01/2021 1154 04/10/21 0222 04/10/21 1119 04/11/21 0359 04/12/21 0227 03/16/2021 0001 04/10/2021 0015 03/30/2021 0102 04/07/2021 0342  WBC 10.7*  --    11.7*  --  10.2  --  5.0 3.1*  --   --   --  3.3*  NEUTROABS 7.2  --   --   --   --   --   --   --   --   --   --   --   HGB 12.5*   < > 11.6*   < > 10.4*   < > 9.3* 9.7*   < > 10.9* 10.9* 10.2*  HCT 43.0   < > 36.9*   < > 32.0*   < > 29.2* 30.8*   < > 32.0* 32.0* 31.8*  MCV 100.2*  --  94.6  --  91.7  --  92.1 92.8  --   --   --  91.6  PLT 217  --  161  --  126*  --  94* 82*  --   --   --  100*   < > = values in this interval not displayed.    Cardiac Enzymes: No results for input(s): CKTOTAL, CKMB, CKMBINDEX, TROPONINI in the last 168 hours. CBG: Recent Labs  Lab 04/08/2021 0335 03/21/2021 0455 03/28/2021 0601 03/24/2021 0706 03/21/2021 1105  GLUCAP 104* 93 109* 82 78     Iron Studies: No results for input(s): IRON, TIBC, TRANSFERRIN, FERRITIN in the last 72 hours. Studies/Results: CT HEAD WO CONTRAST (5MM)  Result Date: 04/12/2021 CLINICAL DATA:  Anoxic brain injury EXAM: CT HEAD WITHOUT CONTRAST TECHNIQUE: Contiguous axial images were obtained from the base of the skull through the vertex without intravenous contrast. COMPARISON:  04/11/2021 FINDINGS: Brain: Compared to the prior head CT, there is relatively poor gray-white matter differentiation, particularly at the deep gray nuclei. Slightly decreased size of the ventricles. No acute hemorrhage. Vascular: No hyperdense vessel or unexpected calcification. Skull:  Left posterior scalp soft tissue swelling.  Normal calvarium Sinuses/Orbits: Bilateral mastoid effusions. Paranasal sinuses are clear. Normal orbits. Other: None. IMPRESSION: 1. Relatively poor gray-white matter differentiation, particularly at the deep gray nuclei, which could indicate early changes of anoxic brain injury. 2. Slightly decreased size of the ventricles, possibly due to mild cerebral edema. 3. Left posterior scalp soft tissue swelling. 4. Bilateral mastoid effusions. Electronically Signed   By: Kevin  Herman M.D.   On: 04/12/2021 19:15   DG Chest Port 1 View  Result Date: 04/12/2021 CLINICAL DATA:  Endotracheal tube placement. EXAM: PORTABLE CHEST 1 VIEW COMPARISON:  April 10, 2021 FINDINGS: An endotracheal tube is seen with its distal tip approximately 5.9 cm from the carina. A nasogastric tube is noted with its distal end extending below the level of the diaphragm. Moderate to marked severity atelectasis and/or infiltrate is seen within the mid right lung and right lung base. This is increased in severity when compared to the prior study. Mild, diffusely increased interstitial lung markings are again seen. A new, partially layering right pleural effusion is also present. No pneumothorax is identified. The cardiac silhouette is enlarged and unchanged in size. The visualized skeletal structures are unremarkable. IMPRESSION: 1. Moderate to marked severity mid right lung and right basilar atelectasis and/or infiltrate, increased in severity when compared to the prior exam. 2. Small partially layering right pleural effusion. Electronically Signed   By: Thaddeus  Houston M.D.   On: 04/12/2021 20:40   DG Abd Portable 1V  Result Date: 04/12/2021 CLINICAL DATA:  Orogastric tube placement. EXAM: PORTABLE ABDOMEN - 1 VIEW COMPARISON:  03/20/2021 FINDINGS: Orogastric tube tip in the proximal to mid stomach and side   hole in the proximal stomach. Injected contrast in the stomach. Stable prominence of  the interstitial markings at the left lung base. Interval patchy density and pleural fluid at the right lung base. No acute bony abnormality. IMPRESSION: 1. Orogastric tube tip and side hole in the stomach. 2. Interval pneumonia or asymmetrical alveolar edema at the right lung base with a small right pleural effusion. Electronically Signed   By: Claudie Revering M.D.   On: 04/12/2021 14:54   Medications: Infusions:  sodium chloride Stopped (04/10/21 0953)   sodium chloride 250 mL (2021/04/25 0304)   dexmedetomidine (PRECEDEX) IV infusion 0.7 mcg/kg/hr (04/25/2021 1015)   dextrose 100 mL/hr at 04/25/2021 1005   dextrose 5 % and 0.9% NaCl Stopped (04/12/21 2357)   feeding supplement (NEPRO CARB STEADY) 55 mL/hr at 04/12/21 1851   levETIRAcetam 500 mg (04/25/21 1006)   midazolam (VERSED) infusion Stopped (04/12/21 0456)   norepinephrine (LEVOPHED) Adult infusion 10 mcg/min (2021/04/25 1000)   phenylephrine (NEO-SYNEPHRINE) Adult infusion 125 mcg/min (2021/04/25 1000)   piperacillin-tazobactam (ZOSYN)  IV 2.25 g (04/25/2021 1056)   propofol (DIPRIVAN) infusion Stopped (04/12/21 1503)   [START ON 04/14/2021] vancomycin      Scheduled Medications:  amiodarone  200 mg Per Tube Daily   budesonide (PULMICORT) nebulizer solution  0.5 mg Nebulization BID   chlorhexidine gluconate (MEDLINE KIT)  15 mL Mouth Rinse BID   Chlorhexidine Gluconate Cloth  6 each Topical Q0600   Chlorhexidine Gluconate Cloth  6 each Topical Q0600   docusate  100 mg Per Tube BID   feeding supplement (PROSource TF)  45 mL Per Tube TID   folic acid  1 mg Intravenous Daily   heparin injection (subcutaneous)  5,000 Units Subcutaneous Q8H   ipratropium-albuterol  3 mL Nebulization Q6H   mouth rinse  15 mL Mouth Rinse 10 times per day   midodrine  10 mg Oral TID WC   multivitamin  1 tablet Per Tube QHS   pantoprazole (PROTONIX) IV  40 mg Intravenous QHS   polyethylene glycol  17 g Per Tube Daily   thiamine injection  100 mg Intravenous  Daily   valproic acid  500 mg Per Tube BID    have reviewed scheduled and prn medications.  Physical Exam: General: unresponsive on vent and sedation Heart: tachy Lungs: mostly clear Abdomen: soft, non tender Extremities: no edema Dialysis Access: left AVF-  patent     Apr 25, 2021,11:51 AM  LOS: 4 days

## 2021-04-15 NOTE — Procedures (Addendum)
Patient Name: Jeremy Mccoy  MRN: [id number]  Epilepsy Attending: Lora Havens  Referring Physician/Provider: Dr Farrel Gordon Duration: 04/12/2021 1205 to April 21, 2021 1326   Patient history: 46 old male status post cardiac arrest.  EEG evaluate for seizures.   Level of alertness:  comatose   AEDs during EEG study: LEV   Technical aspects: This EEG study was done with scalp electrodes positioned according to the 10-20 International system of electrode placement. Electrical activity was acquired at a sampling rate of 500Hz  and reviewed with a high frequency filter of 70Hz  and a low frequency filter of 1Hz . EEG data were recorded continuously and digitally stored.    Description: EEG initially showed burst suppression pattern with bursts of generalized spikes admixed with low amplitude 3 to 5 Hz sharply contoured theta-delta slowing lasting 2 to 3 seconds alternating with EEG attenuation lasting 15 to 30 seconds. After 0330 on 04-21-21, EEG showed predominantly  background suppression not reactive to tactile stimulation.   Multiple episodes of  chest, lips, and both hands  twitching was noted. Concomitant EEG didn't show any eeg change to suggest seizure    ABNORMALITY -Burst suppression, generalized -Spike, generalized   IMPRESSION: This study initially showed evidence of epileptogenicity with generalized onset. After 0330 on 2021-04-21, EEG worsened and showed predominantly profound diffuse encephalopathy.  No seizures were seen during the study.  With history of cardiac arrest, this EEG is most likely due to anoxic/hypoxic brain injury.  Multiple episodes of  chest, lips, and both hands  twitching was noted without concomitant EEG change. These were most likely not epileptic. Myoclonus could have similar presentation.     Miriah Maruyama Barbra Sarks

## 2021-04-15 NOTE — Progress Notes (Signed)
vLTM disconnect  No skin breakdown at all skin sites.  [facility] notiified

## 2021-04-15 NOTE — Procedures (Signed)
Arterial Catheter Insertion Procedure Note  Jeremy Mccoy  [id number]  1965-11-28  Date:2021/04/20  Time:2:45 AM    Provider Performing: Otilio Carpen Peterson Mathey    Procedure: Insertion of Arterial Line [phone]) with US guidance ([id number])   Indication(s) Blood pressure monitoring and/or need for frequent ABGs  Consent Unable to obtain consent due to emergent nature of procedure.  Anesthesia None   Time Out Verified patient identification, verified procedure, site/side was marked, verified correct patient position, special equipment/implants available, medications/allergies/relevant history reviewed, required imaging and test results available.   Sterile Technique Maximal sterile technique including full sterile barrier drape, hand hygiene, sterile gown, sterile gloves, mask, hair covering, sterile ultrasound probe cover (if used).   Procedure Description Area of catheter insertion was cleaned with chlorhexidine and draped in sterile fashion. With real-time ultrasound guidance an arterial catheter was placed into the right radial artery.  Appropriate arterial tracings confirmed on monitor.     Complications/Tolerance None; patient tolerated the procedure well.   EBL Minimal   Specimen(s) None

## 2021-04-15 NOTE — Progress Notes (Signed)
Changes made based on ABG results

## 2021-04-15 NOTE — Progress Notes (Signed)
Pharmacy Antibiotic Note  Jeremy Mccoy is a 55 y.o. male admitted on 03/16/2021, now with new pneumonia.  Pharmacy has been consulted for vancomycin and Zosyn dosing.  Plan: Vancomycin 1750mg  x1 then 1000mg  IV every HD.  Goal pre-HD level 15-25 mcg/mL. Zosyn 2.25g IV Q8H.  Height: 6\' 1"  (185.4 cm) Weight: 85 kg (187 lb 6.3 oz) IBW/kg (Calculated) : 79.9  Temp (24hrs), Avg:99 F (37.2 C), Min:97.7 F (36.5 C), Max:100.2 F (37.9 C)  Recent Labs  Lab 04/01/2021 0122 04/04/2021 0134 04/12/2021 0312 03/26/2021 0647 04/06/2021 0947 04/10/21 0222 04/10/21 0752 04/10/21 1010 04/10/21 1638 04/10/21 2255 04/11/21 0359 04/12/21 0227  WBC 10.7*  --   --  11.7*  --  10.2  --   --   --   --  5.0 3.1*  CREATININE  --    < > 14.79*  --    < > 10.79* 11.05*  --  11.50* 11.97* 11.86* 12.97*  LATICACIDVEN  --   --  7.2*  --   --   --  2.0* 1.7  --   --   --   --    < > = values in this interval not displayed.    Estimated Creatinine Clearance: 7.3 mL/min (A) (by C-G formula based on SCr of 12.97 mg/dL (H)).    No Known Allergies   Thank you for allowing pharmacy to be a part of this patient's care.  Wynona Neat, PharmD, BCPS  2021-04-24 1:07 AM

## 2021-04-15 NOTE — Progress Notes (Signed)
Patient transitioned to comfort care. 2 mg morphine given prior to extubation, no evidence of air hunger. D/C'd vasopressors and all other IVF besides precedex, for comfort. Emotional support provided to patient's sister.

## 2021-04-15 NOTE — Progress Notes (Signed)
Protocol consult for US guided IV for vasopressor reviewed. Pt currently has [place] IV in place. Consult cleared.

## 2021-04-15 NOTE — Progress Notes (Signed)
Subjective: Weaned off propofol yesterday.  2 brothers at bedside.  ROS: Unable to obtain due to poor mental status  Examination  Vital signs in last 24 hours: Temp:  [98.7 F (37.1 C)-100.9 F (38.3 C)] 100.9 F (38.3 C) (11/29 1100) Pulse Rate:  [110-252] 120 (11/29 1200) Resp:  [18-35] 33 (11/29 1200) BP: (45-155)/(15-126) 64/23 (11/29 1200) SpO2:  [86 %-100 %] 90 % (11/29 1200) Arterial Line BP: (82-137)/(49-75) 86/56 (11/29 1200) FiO2 (%):  [50 %-80 %] 80 % (11/29 1200) Weight:  [85 kg-85.9 kg] 85.9 kg (11/29 0500)  General: lying in bed, NAD CVS: pulse-normal rate and rhythm RS: Intubated, coarse breath sounds bilaterally, initiating breaths intermittently Extremities: normal  Neuro: comatose, does not open eyes noxious stimuli, PERRLA, corneal reflex absent, gag reflex absent, does not withdraw to noxious stimuli in all 4 extremities   Basic Metabolic Panel: Recent Labs  Lab 03/21/2021 0312 03/24/2021 0356 04/10/2021 0647 03/26/2021 0947 04/10/21 1638 04/10/21 1925 04/10/21 2255 04/11/21 0359 04/12/21 0227 05/13/21 0001 05-13-2021 0015 2021-05-13 0102 May 13, 2021 0342  NA 135   < >  --    < > 131*   < > 129* 130* 125* 128* 129* 129* 130*  K >7.5*   < >  --    < > 4.6   < > 4.9 4.9 5.0 5.9* 5.9* 5.4* 5.5*  CL 93*  --   --    < > 91*  --  91* 92* 87*  --   --   --  90*  CO2 16*  --   --    < > 21*  --  19* 18* 15*  --   --   --  17*  GLUCOSE 128*  --   --    < > 120*  --  118* 104* 93  --   --   --  106*  BUN 114*  --   --    < > 90*  --  95* 95* 118*  --   --   --  79*  CREATININE 14.79*  --   --    < > 11.50*  --  11.97* 11.86* 12.97*  --   --   --  9.19*  CALCIUM 10.4*  --   --    < > 8.1*  --  7.8* 7.9* 8.0*  --   --   --  8.0*  MG 3.1*  --  2.7*  --   --   --   --   --   --   --   --   --   --   PHOS  --   --   --   --   --   --   --   --  11.3*  --   --   --  >30.0*   < > = values in this interval not displayed.    CBC: Recent Labs  Lab 03/16/2021 0122  04/03/2021 0134 03/19/2021 0647 04/01/2021 1154 04/10/21 0222 04/10/21 1119 04/11/21 0359 04/12/21 0227 2021-05-13 0001 2021-05-13 0015 05/13/2021 0102 05/13/2021 0342  WBC 10.7*  --  11.7*  --  10.2  --  5.0 3.1*  --   --   --  3.3*  NEUTROABS 7.2  --   --   --   --   --   --   --   --   --   --   --   HGB 12.5*   < > 11.6*   < >  10.4*   < > 9.3* 9.7* 10.2* 10.9* 10.9* 10.2*  HCT 43.0   < > 36.9*   < > 32.0*   < > 29.2* 30.8* 30.0* 32.0* 32.0* 31.8*  MCV 100.2*  --  94.6  --  91.7  --  92.1 92.8  --   --   --  91.6  PLT 217  --  161  --  126*  --  94* 82*  --   --   --  100*   < > = values in this interval not displayed.     Coagulation Studies: No results for input(s): LABPROT, INR in the last 72 hours.  Imaging CT head without contrast 04/12/2021:  Relatively poor gray-white matter differentiation, particularly at the deep gray nuclei, which could indicate early changes of anoxic brain injury. 2. Slightly decreased size of the ventricles, possibly due to mild cerebral edema. 3. Left posterior scalp soft tissue swelling. 4. Bilateral mastoid effusions.  ASSESSMENT AND PLAN: 58 old male status post cardiac arrest, 10 minutes to ROSC now with myoclonic status epilepticus.   Cardiac arrest Anoxic/anoxic brain injury Myoclonic status epilepticus ( resolved) -LTM EEG showing predominantly background suppression not reactive to noxious stimulation.   Recommendations: -Patient had status myoclonus within 24 hours of cardiac arrest.  It was controlled with sedation.  However upon sedation was weaned, EEG showed continuous generalized background suppression not reactive to stimulation.  On exam, he is initiating breaths and his pupils are reacting to light but does not have any other cranial nerve reflexes and does not have any response to noxious stimulation.  His CT head yesterday is already starting to show evidence of anoxic/hypoxic brain injury.  Patient has suffered irreversible  neurological brain injury with minimal to no chances of meaningful brain recovery.  This was discussed with patient's brothers at bedside who states patient was a very independent person and would have never wanted to be on a "machine long-term".  This date they would like to wait for their sister and some other family members to arrive most likely by this evening and then transition to comfort care. -We will discontinue LTM EEG - Okay to continue [place] for now -Okay to use Ativan for clinical seizure-like activity -Management of rest of comorbidities per primary team    Thank you for allowing [place] to participate in the care of this patient. If you have any further questions, please contact  me or neurohospitalist.   I have spent a total of 40  minutes with the patient reviewing hospital notes,  test results, labs and examining the patient as well as establishing an assessment and plan that was discussed personally with the patient's brother at bedside and critical care team.50% of time was spent in direct patient care.    Zeb Comfort Epilepsy [facility] For questions after 5pm please refer to AMION to reach the Neurologist on call

## 2021-04-15 NOTE — Progress Notes (Signed)
NAME:  Jeremy Mccoy, MRN:  [id number], DOB:  06-17-1965, LOS: 4 ADMISSION DATE:  03/31/2021, CONSULTATION DATE:  03/27/2021 REFERRING MD:  EDP, CHIEF COMPLAINT:  PEA arrest   History of Present Illness:  Jeremy Mccoy is a 55 y.o. M who was at home when he had a witnessed arrest. It is unknown if bystander CPR was initiated, but was in CPR when EMS arrived and was given two rounds of epinephrine and Calcium with ROSC achieved after approximately 10 minutes.   He was intubated in the ED.  Labs showed K of 7.5 and pH 6.9 with hypertension. Per report, he may have left dialysis early 11/22. CT head negative.  In the ED he was given bicarb, lokelma and insulin and nephrology was consulted.  Pertinent  Medical History  Anemia, Anxiety, Arthritis, CHF (congestive heart failure) ([place]), COPD (chronic obstructive pulmonary disease) ([place]), Crack cocaine use, Dental caries, Depression, ESRD (end stage renal disease) on dialysis ([place]), Headache(784.0), Hematemesis/vomiting blood, History of blood transfusion (10/2011; 04/2013), HTN (hypertension) (06/12/2012), Seizures ([place]), and Shortness of breath.  Significant Hospital Events: Including procedures, antibiotic start and stop dates in addition to other pertinent events   11/25 PEA arrest, intubated and PCCM admit  Interim History / Subjective:  Patient did have several episodes of questionable seizure-like activity during the afternoon 11/28 however this was not supported by cEEG and propfol wean was continued with Ativan 2 mg PRN. He had persistent tachycardia >130 BPM requiring initiation of metoprolol 5 mg q6h PRN HR>110. CT head 11/28 concerning for early changes of anoxic brain injury to deep gray nuclei, slightly decreased size of the ventricles possibly due to mild cerebral edema. Chest x-ray 11/28 concerning for moderate-marked severity mid right lung and right basilar atelectasis and/or infiltrate and small partially layering right pleural effusion.  Night team spoke with the patient's family who transitioned him to DNR status.  Objective   Blood pressure 111/72, pulse (!) 122, temperature (!) 100.9 F (38.3 C), temperature source Axillary, resp. rate (!) 34, height 6\' 1"  (1.854 m), weight 85.9 kg, SpO2 90 %.    Vent Mode: PRVC FiO2 (%):  [50 %-80 %] 80 % Set Rate:  [28 bmp-32 bmp] 32 bmp Vt Set:  [640 mL] 640 mL PEEP:  [7 cmH20-8 cmH20] 8 cmH20 Plateau Pressure:  [28 cmH20-32 cmH20] 32 cmH20   Intake/Output Summary (Last 24 hours) at May 06, 2021 0854 Last data filed at May 06, 2021 0600 Gross per 24 hour  Intake 3499.31 ml  Output 1500 ml  Net 1999.31 ml   Filed Weights   04/12/21 1300 04/12/21 1640 06-May-2021 0500  Weight: 86 kg 85 kg 85.9 kg    Examination: Constitutional: Intubated, in no acute distress. Cardio: Tachycardic with regular rhythm. No murmurs, rubs, gallops. Pulm: Clear to auscultation bilaterally. Intubated. Abdomen: Firm, distended. MSK: No peripheral edema noted. Skin: Skin is warm and dry. Neuro: Off sedation however does not follow commands.  Resolved Hospital Problem list      Assessment & Plan:  PEA arrest Acute encephalopathy CT head 11/28 concerning for early changes of anoxic brain injury to deep gray nuclei, slightly decreased size of the ventricles possibly due to mild cerebral edema. He did have questionable seizure activity throughout the day yesterday which was not evident on cEEG and patient was managed with PRN ativan. Family made decision 11/28 to transition patient code status to DNR. - Neurology following, appreciate their assistance - Keppra 500 mg BID - Continuous EEG - Ativan 2 mg q2h  PRN  HCAP Chest x-ray 11/28 concerning for moderate-marked severity mid right lung and right basilar atelectasis and/or infiltrate and small partially layering right pleural effusion. Tracheal aspirate shows NGTD. - Zosyn 2.25 mg q8h   ESRD on HD M/W/F Anion gap metabolic acidosis - HD per  nephology - Trend BMP - Lokelma   COPD - Duonebs PRN   History of atrial fibrillation/flutter - Restart home amiodarone 200 mg daily  - Transition home Eliquis when able - Metoprolol 5 mg IV q6h PRN HR>110 - Hold home hydralazine  Best Practice (right click and "Reselect all SmartList Selections" daily)   Diet/type: tubefeeds DVT prophylaxis: prophylactic heparin  GI prophylaxis: PPI Lines: Arterial Line Foley:  N/A Code Status:  DNR Last date of multidisciplinary goals of care discussion [11/28]  Labs   CBC: Recent Labs  Lab 03/25/2021 0122 03/19/2021 0134 04/12/2021 0647 03/23/2021 1154 04/10/21 0222 04/10/21 1119 04/11/21 0359 04/12/21 0227 04/20/2021 0001 Apr 20, 2021 0015 04/20/21 0102 2021/04/20 0342  WBC 10.7*  --  11.7*  --  10.2  --  5.0 3.1*  --   --   --  3.3*  NEUTROABS 7.2  --   --   --   --   --   --   --   --   --   --   --   HGB 12.5*   < > 11.6*   < > 10.4*   < > 9.3* 9.7* 10.2* 10.9* 10.9* 10.2*  HCT 43.0   < > 36.9*   < > 32.0*   < > 29.2* 30.8* 30.0* 32.0* 32.0* 31.8*  MCV 100.2*  --  94.6  --  91.7  --  92.1 92.8  --   --   --  91.6  PLT 217  --  161  --  126*  --  94* 82*  --   --   --  100*   < > = values in this interval not displayed.    Basic Metabolic Panel: Recent Labs  Lab 03/26/2021 0312 03/25/2021 0356 03/25/2021 0647 03/28/2021 0947 04/10/21 1638 04/10/21 1925 04/10/21 2255 04/11/21 0359 04/12/21 0227 04-20-21 0001 20-Apr-2021 0015 04-20-2021 0102 April 20, 2021 0342  NA 135   < >  --    < > 131*   < > 129* 130* 125* 128* 129* 129* 130*  K >7.5*   < >  --    < > 4.6   < > 4.9 4.9 5.0 5.9* 5.9* 5.4* 5.5*  CL 93*  --   --    < > 91*  --  91* 92* 87*  --   --   --  90*  CO2 16*  --   --    < > 21*  --  19* 18* 15*  --   --   --  17*  GLUCOSE 128*  --   --    < > 120*  --  118* 104* 93  --   --   --  106*  BUN 114*  --   --    < > 90*  --  95* 95* 118*  --   --   --  79*  CREATININE 14.79*  --   --    < > 11.50*  --  11.97* 11.86* 12.97*  --   --   --   9.19*  CALCIUM 10.4*  --   --    < > 8.1*  --  7.8* 7.9* 8.0*  --   --   --  8.0*  MG 3.1*  --  2.7*  --   --   --   --   --   --   --   --   --   --   PHOS  --   --   --   --   --   --   --   --  11.3*  --   --   --  >30.0*   < > = values in this interval not displayed.   GFR: Estimated Creatinine Clearance: 10.3 mL/min (A) (by C-G formula based on SCr of 9.19 mg/dL (H)). Recent Labs  Lab 03/30/2021 0312 03/26/2021 0647 04/10/21 0222 04/10/21 0752 04/10/21 1010 04/11/21 0359 04/12/21 0227 May 12, 2021 0342  WBC  --    < > 10.2  --   --  5.0 3.1* 3.3*  LATICACIDVEN 7.2*  --   --  2.0* 1.7  --   --   --    < > = values in this interval not displayed.    Liver Function Tests: Recent Labs  Lab 03/24/2021 0312 04/12/21 0227 05-12-21 0342  AST 31  --   --   ALT 21  --   --   ALKPHOS 107  --   --   BILITOT 0.9  --   --   PROT 7.6  --   --   ALBUMIN 3.7 2.4* 2.4*   No results for input(s): LIPASE, AMYLASE in the last 168 hours. Recent Labs  Lab 04/08/2021 0312  AMMONIA 44*    ABG    Component Value Date/Time   PHART 7.182 (LL) 05/12/2021 0102   PCO2ART 50.3 (H) 12-May-2021 0102   PO2ART 80 (L) 2021-05-12 0102   HCO3 18.8 (L) 05-12-2021 0102   TCO2 20 (L) 05/12/2021 0102   ACIDBASEDEF 9.0 (H) 05/12/21 0102   O2SAT 92.0 05-12-21 0102     Coagulation Profile: No results for input(s): INR, PROTIME in the last 168 hours.  Cardiac Enzymes: No results for input(s): CKTOTAL, CKMB, CKMBINDEX, TROPONINI in the last 168 hours.  HbA1C: Hgb A1c MFr Bld  Date/Time Value Ref Range Status  12/19/2020 04:59 AM 5.3 4.8 - 5.6 % Final    Comment:    (NOTE) Pre diabetes:          5.7%-6.4%  Diabetes:              >6.4%  Glycemic control for   <7.0% adults with diabetes   03/27/2020 02:55 AM 5.1 4.8 - 5.6 % Final    Comment:    (NOTE) Pre diabetes:          5.7%-6.4%  Diabetes:              >6.4%  Glycemic control for   <7.0% adults with diabetes     CBG: Recent Labs   Lab 05-12-2021 0224 May 12, 2021 0335 05/12/21 0455 2021-05-12 0601 05-12-2021 0706  GLUCAP 104* 104* 93 109* 82    Review of Systems:   Review of Systems  Unable to perform ROS: Intubated    Past Medical History:  He,  has a past medical history of Anemia, Anxiety, Arthritis, CHF (congestive heart failure) ([place]), COPD (chronic obstructive pulmonary disease) ([place]), Crack cocaine use, Dental caries, Depression, ESRD (end stage renal disease) on dialysis ([place]), Headache(784.0), Hematemesis/vomiting blood, History of blood transfusion (10/2011; 04/2013), HTN (hypertension) (06/12/2012), Seizures ([place]), and Shortness of breath.   Surgical History:   Past Surgical History:  Procedure Laterality Date   AV  FISTULA PLACEMENT  11/29/2011   Procedure: ARTERIOVENOUS (AV) FISTULA CREATION;  Surgeon: Angelia Mould, MD;  Location: [facility];  Service: Vascular;  Laterality: Left;   CARDIOVERSION N/A 03/31/2020   Procedure: CARDIOVERSION;  Surgeon: Lelon Perla, MD;  Location: [facility] ENDOSCOPY;  Service: Cardiovascular;  Laterality: N/A;   ESOPHAGOGASTRODUODENOSCOPY N/A 05/17/2013   Procedure: ESOPHAGOGASTRODUODENOSCOPY (EGD);  Surgeon: Beryle Beams, MD;  Location: [facility] ENDOSCOPY;  Service: Endoscopy;  Laterality: N/A;   INGUINAL HERNIA REPAIR Bilateral 1967   IR FLUORO GUIDE CV LINE RIGHT  10/20/2017   IR THROMBECTOMY AV FISTULA W/THROMBOLYSIS/PTA INC/SHUNT/IMG LEFT Left 10/24/2017   IR US GUIDE VASC ACCESS LEFT  10/24/2017   IR US GUIDE VASC ACCESS RIGHT  10/20/2017   RENAL BIOPSY  11/07/2011   TEE WITHOUT CARDIOVERSION N/A 03/31/2020   Procedure: TRANSESOPHAGEAL ECHOCARDIOGRAM (TEE);  Surgeon: Lelon Perla, MD;  Location: [facility] ENDOSCOPY;  Service: Cardiovascular;  Laterality: N/A;     Social History:   reports that he has been smoking cigars and cigarettes. He has a 50.00 pack-year smoking history. He has never used smokeless tobacco. He reports current drug use. Drugs: Marijuana and Cocaine. He  reports that he does not drink alcohol.   Family History:  His family history includes Heart attack in his father.   Allergies No Known Allergies   Home Medications  Prior to Admission medications   Medication Sig Start Date End Date Taking? Authorizing Provider  acetaminophen (TYLENOL) 500 MG tablet Take 1 tablet (500 mg total) by mouth every 8 (eight) hours as needed for mild pain or fever. 6 times a day, 500 mg, 12 to 24 tabs a day, 6,000 to 12,000 mg daily for pain 08/05/16   Molt, Bethany, DO  albuterol (VENTOLIN HFA) 108 (90 Base) MCG/ACT inhaler Inhale 3-6 puffs into the lungs every 6 (six) hours as needed for wheezing or shortness of breath.     [provider]  amiodarone (PACERONE) 200 MG tablet Take 200 mg by mouth daily.    [provider]  apixaban (ELIQUIS) 5 MG TABS tablet Take 1 tablet (5 mg total) by mouth 2 (two) times daily. 01/28/21 05/22/21  Deberah Pelton, NP  clotrimazole (LOTRIMIN) 1 % cream Apply topically 2 (two) times daily as needed (itching). Back and chest 01/30/21   Alma Friendly, MD  diphenhydrAMINE (BENADRYL) 25 mg capsule Take 25 mg by mouth every 6 (six) hours as needed for itching.    [provider]  hydrALAZINE (APRESOLINE) 25 MG tablet Take 1 tablet (25 mg total) by mouth 3 (three) times daily. 05/26/20   Richardson Dopp T, PA-C  hydrocortisone cream 1 % Apply 1 application topically 2 (two) times daily as needed for itching.    [provider]  hydrOXYzine (ATARAX/VISTARIL) 25 MG tablet Take 50 mg by mouth 3 (three) times daily as needed for itching.    [provider]  metoprolol succinate (TOPROL XL) 50 MG 24 hr tablet Take 1 tablet (50 mg total) by mouth daily. Take with or immediately following a meal. 02/04/21 02/04/22  Fenton, Clint R, PA  pantoprazole (PROTONIX) 40 MG tablet TAKE 1 TABLET (40 MG TOTAL) BY MOUTH DAILY. Patient taking differently: Take 40 mg by mouth daily. 04/01/20 05/22/21  Ezequiel Essex,  MD  sevelamer carbonate (RENVELA) 800 MG tablet Take 2 tablets (1,600 mg total) by mouth 3 (three) times daily with meals. Patient taking differently: Take 3,200 mg by mouth See admin instructions. 3200mg  three times daily  with meals and 800mg  with snacks 01/25/14   McLean-Scocuzza, Nino Glow, MD  sodium zirconium cyclosilicate (LOKELMA) 10 g PACK packet Take 10 g by mouth daily as needed (high potassium).    [provider]  SPIRIVA RESPIMAT 2.5 MCG/ACT AERS Take 2 puffs by mouth daily. 01/28/21   [provider]  tolnaftate (TINACTIN) 1 % spray Apply 1 application topically 2 (two) times daily as needed (itching on arms/back).    [provider]  vitamin B-12 (CYANOCOBALAMIN) 1000 MCG tablet Take 1,000 mcg by mouth daily.    [provider]     Critical care time: 30 minutes

## 2021-04-15 NOTE — Progress Notes (Signed)
Pt receives out-pt HD at [facility] SW/AF on MWF. Pt arrives around 9:45 for 10:05 chair time. Will assist as needed.  Melven Sartorius Renal Navigator [phone]

## 2021-04-15 NOTE — Procedures (Signed)
Extubation Procedure Note  Patient Details:   Name: Jeremy Mccoy DOB: [birth year] MRN: [id number]   Airway Documentation:    Vent end date: 2021/04/28 Vent end time: 1518   Pt extubated to RA per comfort care order. Vilinda Blanks 28-Apr-2021, 2:12 PM

## 2021-04-15 NOTE — Death Summary Note (Signed)
  Name: Jeremy Mccoy MRN: [id number] DOB: 10/21/1965 55 y.o.  Date of Admission: 04/04/2021  1:22 AM Date of Discharge: 04-21-21 Attending Physician: Laurin Coder, MD  Discharge Diagnosis: PEA Arrest Acute hypercapnic respiratory failure Metabolic acidosis End stage renal disease Anoxic encephalopathy Myoclonic status epilepticus, generalized  Cause of death: Cardiopulmonary arrest Time of death: 14:20  Disposition and follow-up:   Jeremy Mccoy was discharged from [facility] in expired condition.    Hospital Course: Mr. Sweetin presented to [facility] ED after suffering from PEA arrest on 03/19/2021. ROSC was achieved after approximately 10 minutes of CPR by EMS and the patient was intubated on arrival and ultimately initiated on pressors. On presentation to the ICU, he was noted to have myoclonic jerks concerning for seizure activity and placed on EEG which observed myoclonic status epilepticus with generalized onset as well as diffuse encephalopathy. Initial MRI 11/25 did not show evidence of anoxic brain injury. He was initiated on antiepileptic medication. He underwent hemodialysis on 11/25, 11/27. He began having episodes of persistent tachycardia requiring metoprolol on 11/28. While weaning propofol 11/28 the patient began exhibiting signs concerning for recurrence of seizure-like activity however these did not appear on cEEG and he was managed with Ativan as needed to control symptoms. He underwent CT imaging of his head 11/28 which was concerning for anoxic brain injury to deep gray nuclei, slightly decreased size of the ventricles possibly due to mild cerebral edema.. After discussion with his family, the patient's code status was transitioned to DNR. He was transitioned to comfort care measures at 11/29 14:03 and expired 14:20.  Signed: Farrel Gordon, DO 21-Apr-2021, 2:22 PM

## 2021-04-15 DEATH — deceased

## 2021-04-17 LAB — CULTURE, RESPIRATORY W GRAM STAIN: Gram Stain: NONE SEEN

## 2021-04-29 LAB — DRUG SCREEN 10 W/CONF, SERUM
Amphetamines, IA: NEGATIVE ng/mL
Barbiturates, IA: NEGATIVE ug/mL
Benzodiazepines, IA: NEGATIVE ng/mL
Cocaine & Metabolite, IA: POSITIVE ng/mL — AB
Methadone, IA: NEGATIVE ng/mL
Opiates, IA: NEGATIVE ng/mL
Oxycodones, IA: NEGATIVE ng/mL
Phencyclidine, IA: NEGATIVE ng/mL
Propoxyphene, IA: NEGATIVE ng/mL
THC(Marijuana) Metabolite, IA: NEGATIVE ng/mL

## 2021-04-29 LAB — COCAINE,MS,WB/SP RFX
Benzoylecgonine: >1500 ng/mL
Cocaine Confirmation: POSITIVE
Cocaine: NEGATIVE ng/mL

## 2021-05-03 ENCOUNTER — Other Ambulatory Visit (HOSPITAL_COMMUNITY): Payer: Self-pay | Admitting: Physician Assistant

## 2023-01-20 ENCOUNTER — Other Ambulatory Visit (HOSPITAL_COMMUNITY): Payer: Self-pay
